# Patient Record
Sex: Female | Born: 1935 | Race: White | Hispanic: No | Smoking: Former smoker
Health system: Southern US, Community
[De-identification: ages and names within clinical notes are randomized; demographics above are authoritative.]

## PROBLEM LIST (undated history)

## (undated) DIAGNOSIS — I459 Conduction disorder, unspecified: Secondary | ICD-10-CM

## (undated) DIAGNOSIS — I639 Cerebral infarction, unspecified: Secondary | ICD-10-CM

## (undated) DIAGNOSIS — K284 Chronic or unspecified gastrojejunal ulcer with hemorrhage: Secondary | ICD-10-CM

## (undated) DIAGNOSIS — I509 Heart failure, unspecified: Secondary | ICD-10-CM

## (undated) DIAGNOSIS — IMO0001 Reserved for inherently not codable concepts without codable children: Secondary | ICD-10-CM

## (undated) DIAGNOSIS — Z8711 Personal history of peptic ulcer disease: Secondary | ICD-10-CM

## (undated) DIAGNOSIS — F419 Anxiety disorder, unspecified: Secondary | ICD-10-CM

## (undated) DIAGNOSIS — I2510 Atherosclerotic heart disease of native coronary artery without angina pectoris: Secondary | ICD-10-CM

## (undated) DIAGNOSIS — J42 Unspecified chronic bronchitis: Secondary | ICD-10-CM

## (undated) DIAGNOSIS — D494 Neoplasm of unspecified behavior of bladder: Secondary | ICD-10-CM

## (undated) DIAGNOSIS — J9610 Chronic respiratory failure, unspecified whether with hypoxia or hypercapnia: Secondary | ICD-10-CM

## (undated) DIAGNOSIS — I10 Essential (primary) hypertension: Secondary | ICD-10-CM

## (undated) DIAGNOSIS — Z87442 Personal history of urinary calculi: Secondary | ICD-10-CM

## (undated) DIAGNOSIS — M1990 Unspecified osteoarthritis, unspecified site: Secondary | ICD-10-CM

## (undated) DIAGNOSIS — I219 Acute myocardial infarction, unspecified: Secondary | ICD-10-CM

## (undated) DIAGNOSIS — K274 Chronic or unspecified peptic ulcer, site unspecified, with hemorrhage: Secondary | ICD-10-CM

## (undated) DIAGNOSIS — J189 Pneumonia, unspecified organism: Secondary | ICD-10-CM

## (undated) DIAGNOSIS — K219 Gastro-esophageal reflux disease without esophagitis: Secondary | ICD-10-CM

## (undated) DIAGNOSIS — J449 Chronic obstructive pulmonary disease, unspecified: Secondary | ICD-10-CM

## (undated) DIAGNOSIS — E785 Hyperlipidemia, unspecified: Secondary | ICD-10-CM

## (undated) DIAGNOSIS — N9489 Other specified conditions associated with female genital organs and menstrual cycle: Secondary | ICD-10-CM

## (undated) DIAGNOSIS — R569 Unspecified convulsions: Secondary | ICD-10-CM

## (undated) DIAGNOSIS — I82409 Acute embolism and thrombosis of unspecified deep veins of unspecified lower extremity: Secondary | ICD-10-CM

## (undated) HISTORY — PX: ABDOMINAL HYSTERECTOMY: SHX81

## (undated) HISTORY — PX: STOMACH SURGERY: SHX791

## (undated) HISTORY — PX: HIP FRACTURE SURGERY: SHX118

## (undated) HISTORY — PX: CARDIAC CATHETERIZATION: SHX172

---

## 2004-11-24 ENCOUNTER — Emergency Department: Payer: Self-pay | Admitting: Internal Medicine

## 2008-04-28 ENCOUNTER — Ambulatory Visit: Payer: Self-pay | Admitting: Internal Medicine

## 2008-04-30 ENCOUNTER — Ambulatory Visit: Payer: Self-pay | Admitting: Family Medicine

## 2008-05-04 ENCOUNTER — Ambulatory Visit: Payer: Self-pay | Admitting: Family Medicine

## 2008-05-07 ENCOUNTER — Ambulatory Visit: Payer: Self-pay | Admitting: Family Medicine

## 2008-05-11 ENCOUNTER — Ambulatory Visit: Payer: Self-pay | Admitting: Family Medicine

## 2008-05-15 ENCOUNTER — Ambulatory Visit: Payer: Self-pay | Admitting: Internal Medicine

## 2008-06-07 ENCOUNTER — Ambulatory Visit: Payer: Self-pay | Admitting: Internal Medicine

## 2008-08-16 ENCOUNTER — Ambulatory Visit: Payer: Self-pay | Admitting: Internal Medicine

## 2008-11-03 ENCOUNTER — Ambulatory Visit: Payer: Self-pay | Admitting: Internal Medicine

## 2008-12-14 ENCOUNTER — Ambulatory Visit: Payer: Self-pay | Admitting: Internal Medicine

## 2009-01-23 ENCOUNTER — Ambulatory Visit: Payer: Self-pay | Admitting: Family Medicine

## 2009-04-02 ENCOUNTER — Ambulatory Visit: Payer: Self-pay | Admitting: Family Medicine

## 2009-06-30 ENCOUNTER — Ambulatory Visit: Payer: Self-pay | Admitting: Family Medicine

## 2009-07-10 ENCOUNTER — Ambulatory Visit: Payer: Self-pay | Admitting: Family Medicine

## 2009-08-27 ENCOUNTER — Ambulatory Visit: Payer: Self-pay | Admitting: Internal Medicine

## 2009-09-29 ENCOUNTER — Ambulatory Visit: Payer: Self-pay | Admitting: Internal Medicine

## 2010-02-24 ENCOUNTER — Ambulatory Visit: Payer: Self-pay | Admitting: Internal Medicine

## 2010-04-06 ENCOUNTER — Observation Stay: Payer: Self-pay | Admitting: Internal Medicine

## 2010-04-06 ENCOUNTER — Ambulatory Visit: Payer: Self-pay | Admitting: Internal Medicine

## 2010-05-22 ENCOUNTER — Inpatient Hospital Stay: Payer: Self-pay | Admitting: Unknown Physician Specialty

## 2010-05-28 LAB — PATHOLOGY REPORT

## 2011-04-30 ENCOUNTER — Ambulatory Visit: Payer: Self-pay | Admitting: Internal Medicine

## 2011-08-02 ENCOUNTER — Ambulatory Visit: Payer: Self-pay

## 2011-08-02 DIAGNOSIS — L270 Generalized skin eruption due to drugs and medicaments taken internally: Secondary | ICD-10-CM | POA: Diagnosis not present

## 2011-08-06 ENCOUNTER — Ambulatory Visit: Payer: Self-pay | Admitting: Internal Medicine

## 2011-08-06 DIAGNOSIS — Z4800 Encounter for change or removal of nonsurgical wound dressing: Secondary | ICD-10-CM | POA: Diagnosis not present

## 2011-09-17 ENCOUNTER — Ambulatory Visit: Payer: Self-pay | Admitting: Internal Medicine

## 2011-09-17 DIAGNOSIS — R42 Dizziness and giddiness: Secondary | ICD-10-CM | POA: Diagnosis not present

## 2011-09-17 DIAGNOSIS — I2510 Atherosclerotic heart disease of native coronary artery without angina pectoris: Secondary | ICD-10-CM | POA: Diagnosis not present

## 2011-09-17 DIAGNOSIS — I10 Essential (primary) hypertension: Secondary | ICD-10-CM | POA: Diagnosis not present

## 2011-10-08 DIAGNOSIS — E785 Hyperlipidemia, unspecified: Secondary | ICD-10-CM | POA: Diagnosis not present

## 2011-10-08 DIAGNOSIS — I10 Essential (primary) hypertension: Secondary | ICD-10-CM | POA: Diagnosis not present

## 2011-10-08 DIAGNOSIS — Z79899 Other long term (current) drug therapy: Secondary | ICD-10-CM | POA: Diagnosis not present

## 2011-10-26 ENCOUNTER — Emergency Department: Payer: Self-pay | Admitting: Emergency Medicine

## 2011-10-26 ENCOUNTER — Ambulatory Visit: Payer: Self-pay | Admitting: Family Medicine

## 2011-10-26 DIAGNOSIS — R0602 Shortness of breath: Secondary | ICD-10-CM | POA: Diagnosis not present

## 2011-10-26 DIAGNOSIS — R6889 Other general symptoms and signs: Secondary | ICD-10-CM | POA: Diagnosis not present

## 2011-10-26 DIAGNOSIS — R062 Wheezing: Secondary | ICD-10-CM | POA: Diagnosis not present

## 2011-10-26 DIAGNOSIS — R079 Chest pain, unspecified: Secondary | ICD-10-CM | POA: Diagnosis not present

## 2011-10-26 DIAGNOSIS — Z7982 Long term (current) use of aspirin: Secondary | ICD-10-CM | POA: Diagnosis not present

## 2011-10-26 DIAGNOSIS — Z95818 Presence of other cardiac implants and grafts: Secondary | ICD-10-CM | POA: Diagnosis not present

## 2011-10-26 DIAGNOSIS — Z79899 Other long term (current) drug therapy: Secondary | ICD-10-CM | POA: Diagnosis not present

## 2011-10-26 DIAGNOSIS — R509 Fever, unspecified: Secondary | ICD-10-CM | POA: Diagnosis not present

## 2011-10-26 DIAGNOSIS — J441 Chronic obstructive pulmonary disease with (acute) exacerbation: Secondary | ICD-10-CM | POA: Diagnosis not present

## 2011-10-26 LAB — CBC
HCT: 40.8 % (ref 35.0–47.0)
MCV: 95 fL (ref 80–100)
Platelet: 219 10*3/uL (ref 150–440)
RBC: 4.3 10*6/uL (ref 3.80–5.20)
WBC: 11.1 10*3/uL — ABNORMAL HIGH (ref 3.6–11.0)

## 2011-10-26 LAB — COMPREHENSIVE METABOLIC PANEL
Albumin: 3.6 g/dL (ref 3.4–5.0)
Alkaline Phosphatase: 116 U/L (ref 50–136)
Anion Gap: 9 (ref 7–16)
Bilirubin,Total: 0.8 mg/dL (ref 0.2–1.0)
Calcium, Total: 9.4 mg/dL (ref 8.5–10.1)
Co2: 28 mmol/L (ref 21–32)
Creatinine: 0.68 mg/dL (ref 0.60–1.30)
Osmolality: 274 (ref 275–301)
Potassium: 3.4 mmol/L — ABNORMAL LOW (ref 3.5–5.1)
SGOT(AST): 27 U/L (ref 15–37)
SGPT (ALT): 24 U/L
Sodium: 138 mmol/L (ref 136–145)
Total Protein: 7.7 g/dL (ref 6.4–8.2)

## 2011-10-26 LAB — URINALYSIS, COMPLETE
Glucose,UR: NEGATIVE mg/dL (ref 0–75)
Nitrite: NEGATIVE
Ph: 6 (ref 4.5–8.0)
RBC,UR: 3 /HPF (ref 0–5)
Squamous Epithelial: <1
WBC UR: 5 /HPF (ref 0–5)

## 2011-10-26 LAB — TROPONIN I: Troponin-I: <0.02 ng/mL

## 2011-10-29 DIAGNOSIS — I10 Essential (primary) hypertension: Secondary | ICD-10-CM | POA: Diagnosis not present

## 2011-10-29 DIAGNOSIS — J189 Pneumonia, unspecified organism: Secondary | ICD-10-CM | POA: Diagnosis not present

## 2011-11-09 ENCOUNTER — Ambulatory Visit: Payer: Self-pay | Admitting: Family Medicine

## 2011-11-09 DIAGNOSIS — M25559 Pain in unspecified hip: Secondary | ICD-10-CM | POA: Diagnosis not present

## 2011-11-09 DIAGNOSIS — IMO0002 Reserved for concepts with insufficient information to code with codable children: Secondary | ICD-10-CM | POA: Diagnosis not present

## 2011-11-09 DIAGNOSIS — M24159 Other articular cartilage disorders, unspecified hip: Secondary | ICD-10-CM | POA: Diagnosis not present

## 2012-04-19 DIAGNOSIS — H26019 Infantile and juvenile cortical, lamellar, or zonular cataract, unspecified eye: Secondary | ICD-10-CM | POA: Diagnosis not present

## 2012-10-09 DIAGNOSIS — I2510 Atherosclerotic heart disease of native coronary artery without angina pectoris: Secondary | ICD-10-CM | POA: Diagnosis not present

## 2012-10-09 DIAGNOSIS — I10 Essential (primary) hypertension: Secondary | ICD-10-CM | POA: Diagnosis not present

## 2012-10-21 ENCOUNTER — Ambulatory Visit: Payer: Self-pay | Admitting: Family Medicine

## 2012-10-21 DIAGNOSIS — K219 Gastro-esophageal reflux disease without esophagitis: Secondary | ICD-10-CM | POA: Diagnosis not present

## 2012-10-21 DIAGNOSIS — Z79899 Other long term (current) drug therapy: Secondary | ICD-10-CM | POA: Diagnosis not present

## 2012-10-21 DIAGNOSIS — I10 Essential (primary) hypertension: Secondary | ICD-10-CM | POA: Diagnosis not present

## 2012-10-21 DIAGNOSIS — R05 Cough: Secondary | ICD-10-CM | POA: Diagnosis not present

## 2012-10-21 DIAGNOSIS — E7800 Pure hypercholesterolemia, unspecified: Secondary | ICD-10-CM | POA: Diagnosis not present

## 2012-10-21 DIAGNOSIS — R1012 Left upper quadrant pain: Secondary | ICD-10-CM | POA: Diagnosis not present

## 2012-10-21 DIAGNOSIS — R079 Chest pain, unspecified: Secondary | ICD-10-CM | POA: Diagnosis not present

## 2012-10-21 DIAGNOSIS — M549 Dorsalgia, unspecified: Secondary | ICD-10-CM | POA: Diagnosis not present

## 2012-10-21 LAB — URINALYSIS, COMPLETE
Bacteria: NEGATIVE
Bilirubin,UR: NEGATIVE
Glucose,UR: NEGATIVE mg/dL (ref 0–75)
Ketone: NEGATIVE
Nitrite: NEGATIVE
Ph: 8 (ref 4.5–8.0)
Specific Gravity: 1.01 (ref 1.003–1.030)

## 2012-10-22 ENCOUNTER — Ambulatory Visit: Payer: Self-pay | Admitting: Physician Assistant

## 2012-10-22 DIAGNOSIS — I10 Essential (primary) hypertension: Secondary | ICD-10-CM | POA: Diagnosis not present

## 2012-10-22 DIAGNOSIS — N200 Calculus of kidney: Secondary | ICD-10-CM | POA: Diagnosis not present

## 2012-10-22 DIAGNOSIS — R1909 Other intra-abdominal and pelvic swelling, mass and lump: Secondary | ICD-10-CM | POA: Diagnosis not present

## 2012-10-22 DIAGNOSIS — J438 Other emphysema: Secondary | ICD-10-CM | POA: Diagnosis not present

## 2012-10-22 DIAGNOSIS — E7800 Pure hypercholesterolemia, unspecified: Secondary | ICD-10-CM | POA: Diagnosis not present

## 2012-10-22 DIAGNOSIS — Z90710 Acquired absence of both cervix and uterus: Secondary | ICD-10-CM | POA: Diagnosis not present

## 2012-10-22 LAB — CBC WITH DIFFERENTIAL/PLATELET
Basophil %: 0.2 %
HGB: 13.8 g/dL (ref 12.0–16.0)
Lymphocyte #: 1.2 10*3/uL (ref 1.0–3.6)
MCH: 29.3 pg (ref 26.0–34.0)
MCHC: 33 g/dL (ref 32.0–36.0)
Monocyte %: 6.3 %
Neutrophil %: 76.1 %
Platelet: 276 10*3/uL (ref 150–440)
RBC: 4.73 10*6/uL (ref 3.80–5.20)
RDW: 13 % (ref 11.5–14.5)
WBC: 7.1 10*3/uL (ref 3.6–11.0)

## 2012-10-22 LAB — BASIC METABOLIC PANEL
Anion Gap: 8 (ref 7–16)
BUN: 11 mg/dL (ref 7–18)
Calcium, Total: 9.3 mg/dL (ref 8.5–10.1)
Creatinine: 0.58 mg/dL — ABNORMAL LOW (ref 0.60–1.30)
Osmolality: 250 (ref 275–301)
Sodium: 124 mmol/L — ABNORMAL LOW (ref 136–145)

## 2012-10-23 ENCOUNTER — Ambulatory Visit: Payer: Self-pay | Admitting: Family Medicine

## 2012-10-23 DIAGNOSIS — R109 Unspecified abdominal pain: Secondary | ICD-10-CM | POA: Diagnosis not present

## 2012-10-23 DIAGNOSIS — R1031 Right lower quadrant pain: Secondary | ICD-10-CM | POA: Diagnosis not present

## 2012-10-23 DIAGNOSIS — N83209 Unspecified ovarian cyst, unspecified side: Secondary | ICD-10-CM | POA: Diagnosis not present

## 2012-10-23 LAB — CBC WITH DIFFERENTIAL/PLATELET
Basophil #: 0 10*3/uL (ref 0.0–0.1)
Basophil %: 0.1 %
Eosinophil #: 0 10*3/uL (ref 0.0–0.7)
Eosinophil %: 0.3 %
HGB: 14 g/dL (ref 12.0–16.0)
Lymphocyte #: 1.3 10*3/uL (ref 1.0–3.6)
MCH: 28.9 pg (ref 26.0–34.0)
MCHC: 33 g/dL (ref 32.0–36.0)
MCV: 88 fL (ref 80–100)
Monocyte #: 0.7 x10 3/mm (ref 0.2–0.9)
Monocyte %: 7.3 %
RBC: 4.84 10*6/uL (ref 3.80–5.20)
RDW: 13 % (ref 11.5–14.5)
WBC: 9 10*3/uL (ref 3.6–11.0)

## 2012-10-26 DIAGNOSIS — N83209 Unspecified ovarian cyst, unspecified side: Secondary | ICD-10-CM | POA: Diagnosis not present

## 2012-10-27 DIAGNOSIS — B029 Zoster without complications: Secondary | ICD-10-CM | POA: Diagnosis not present

## 2012-10-27 DIAGNOSIS — Z1151 Encounter for screening for human papillomavirus (HPV): Secondary | ICD-10-CM | POA: Diagnosis not present

## 2012-10-27 DIAGNOSIS — Z01419 Encounter for gynecological examination (general) (routine) without abnormal findings: Secondary | ICD-10-CM | POA: Diagnosis not present

## 2012-10-30 DIAGNOSIS — N949 Unspecified condition associated with female genital organs and menstrual cycle: Secondary | ICD-10-CM | POA: Diagnosis not present

## 2012-10-30 DIAGNOSIS — N83209 Unspecified ovarian cyst, unspecified side: Secondary | ICD-10-CM | POA: Diagnosis not present

## 2012-10-30 DIAGNOSIS — R1900 Intra-abdominal and pelvic swelling, mass and lump, unspecified site: Secondary | ICD-10-CM | POA: Diagnosis not present

## 2012-11-06 DIAGNOSIS — N83209 Unspecified ovarian cyst, unspecified side: Secondary | ICD-10-CM | POA: Diagnosis not present

## 2012-11-08 ENCOUNTER — Ambulatory Visit: Payer: Self-pay | Admitting: Gynecologic Oncology

## 2012-11-14 ENCOUNTER — Ambulatory Visit: Payer: Self-pay | Admitting: Gynecologic Oncology

## 2012-11-14 DIAGNOSIS — Z90710 Acquired absence of both cervix and uterus: Secondary | ICD-10-CM | POA: Diagnosis not present

## 2012-11-14 DIAGNOSIS — Z87891 Personal history of nicotine dependence: Secondary | ICD-10-CM | POA: Diagnosis not present

## 2012-11-14 DIAGNOSIS — R059 Cough, unspecified: Secondary | ICD-10-CM | POA: Diagnosis not present

## 2012-11-14 DIAGNOSIS — I10 Essential (primary) hypertension: Secondary | ICD-10-CM | POA: Diagnosis not present

## 2012-11-14 DIAGNOSIS — I2510 Atherosclerotic heart disease of native coronary artery without angina pectoris: Secondary | ICD-10-CM | POA: Diagnosis not present

## 2012-11-14 DIAGNOSIS — Z79899 Other long term (current) drug therapy: Secondary | ICD-10-CM | POA: Diagnosis not present

## 2012-11-14 DIAGNOSIS — R109 Unspecified abdominal pain: Secondary | ICD-10-CM | POA: Diagnosis not present

## 2012-11-14 DIAGNOSIS — Z95818 Presence of other cardiac implants and grafts: Secondary | ICD-10-CM | POA: Diagnosis not present

## 2012-11-14 DIAGNOSIS — Z7902 Long term (current) use of antithrombotics/antiplatelets: Secondary | ICD-10-CM | POA: Diagnosis not present

## 2012-11-14 DIAGNOSIS — N83209 Unspecified ovarian cyst, unspecified side: Secondary | ICD-10-CM | POA: Diagnosis not present

## 2012-11-17 DIAGNOSIS — R0989 Other specified symptoms and signs involving the circulatory and respiratory systems: Secondary | ICD-10-CM | POA: Diagnosis not present

## 2012-11-17 DIAGNOSIS — I10 Essential (primary) hypertension: Secondary | ICD-10-CM | POA: Diagnosis not present

## 2012-11-17 DIAGNOSIS — R0789 Other chest pain: Secondary | ICD-10-CM | POA: Diagnosis not present

## 2012-11-17 DIAGNOSIS — R0609 Other forms of dyspnea: Secondary | ICD-10-CM | POA: Diagnosis not present

## 2012-11-17 DIAGNOSIS — I2510 Atherosclerotic heart disease of native coronary artery without angina pectoris: Secondary | ICD-10-CM | POA: Diagnosis not present

## 2012-11-27 DIAGNOSIS — I2510 Atherosclerotic heart disease of native coronary artery without angina pectoris: Secondary | ICD-10-CM | POA: Diagnosis not present

## 2012-11-27 DIAGNOSIS — E782 Mixed hyperlipidemia: Secondary | ICD-10-CM | POA: Diagnosis not present

## 2012-11-27 DIAGNOSIS — I10 Essential (primary) hypertension: Secondary | ICD-10-CM | POA: Diagnosis not present

## 2012-12-08 ENCOUNTER — Ambulatory Visit: Payer: Self-pay | Admitting: Gynecologic Oncology

## 2012-12-19 ENCOUNTER — Ambulatory Visit: Payer: Self-pay

## 2012-12-19 DIAGNOSIS — B0229 Other postherpetic nervous system involvement: Secondary | ICD-10-CM | POA: Diagnosis not present

## 2013-01-12 ENCOUNTER — Emergency Department: Payer: Self-pay | Admitting: Emergency Medicine

## 2013-01-12 DIAGNOSIS — IMO0002 Reserved for concepts with insufficient information to code with codable children: Secondary | ICD-10-CM | POA: Diagnosis not present

## 2013-01-12 DIAGNOSIS — N200 Calculus of kidney: Secondary | ICD-10-CM | POA: Diagnosis not present

## 2013-01-12 DIAGNOSIS — M5137 Other intervertebral disc degeneration, lumbosacral region: Secondary | ICD-10-CM | POA: Diagnosis not present

## 2013-01-12 DIAGNOSIS — G9389 Other specified disorders of brain: Secondary | ICD-10-CM | POA: Diagnosis not present

## 2013-01-12 DIAGNOSIS — R42 Dizziness and giddiness: Secondary | ICD-10-CM | POA: Diagnosis not present

## 2013-01-12 DIAGNOSIS — K8020 Calculus of gallbladder without cholecystitis without obstruction: Secondary | ICD-10-CM | POA: Diagnosis not present

## 2013-01-12 DIAGNOSIS — J984 Other disorders of lung: Secondary | ICD-10-CM | POA: Diagnosis not present

## 2013-01-12 DIAGNOSIS — S3981XA Other specified injuries of abdomen, initial encounter: Secondary | ICD-10-CM | POA: Diagnosis not present

## 2013-01-12 DIAGNOSIS — S0993XA Unspecified injury of face, initial encounter: Secondary | ICD-10-CM | POA: Diagnosis not present

## 2013-01-12 DIAGNOSIS — S0990XA Unspecified injury of head, initial encounter: Secondary | ICD-10-CM | POA: Diagnosis not present

## 2013-01-12 DIAGNOSIS — S298XXA Other specified injuries of thorax, initial encounter: Secondary | ICD-10-CM | POA: Diagnosis not present

## 2013-01-12 DIAGNOSIS — R51 Headache: Secondary | ICD-10-CM | POA: Diagnosis not present

## 2013-01-12 DIAGNOSIS — S0003XA Contusion of scalp, initial encounter: Secondary | ICD-10-CM | POA: Diagnosis not present

## 2013-01-12 DIAGNOSIS — M542 Cervicalgia: Secondary | ICD-10-CM | POA: Diagnosis not present

## 2013-01-13 DIAGNOSIS — S3981XA Other specified injuries of abdomen, initial encounter: Secondary | ICD-10-CM | POA: Diagnosis not present

## 2013-01-13 DIAGNOSIS — S0990XA Unspecified injury of head, initial encounter: Secondary | ICD-10-CM | POA: Diagnosis not present

## 2013-01-13 DIAGNOSIS — S298XXA Other specified injuries of thorax, initial encounter: Secondary | ICD-10-CM | POA: Diagnosis not present

## 2013-01-13 DIAGNOSIS — S0993XA Unspecified injury of face, initial encounter: Secondary | ICD-10-CM | POA: Diagnosis not present

## 2013-01-13 DIAGNOSIS — N200 Calculus of kidney: Secondary | ICD-10-CM | POA: Diagnosis not present

## 2013-01-13 DIAGNOSIS — IMO0002 Reserved for concepts with insufficient information to code with codable children: Secondary | ICD-10-CM | POA: Diagnosis not present

## 2013-01-13 DIAGNOSIS — G9389 Other specified disorders of brain: Secondary | ICD-10-CM | POA: Diagnosis not present

## 2013-05-15 DIAGNOSIS — I2510 Atherosclerotic heart disease of native coronary artery without angina pectoris: Secondary | ICD-10-CM | POA: Diagnosis not present

## 2013-05-15 DIAGNOSIS — E782 Mixed hyperlipidemia: Secondary | ICD-10-CM | POA: Diagnosis not present

## 2013-05-15 DIAGNOSIS — I10 Essential (primary) hypertension: Secondary | ICD-10-CM | POA: Diagnosis not present

## 2013-09-09 ENCOUNTER — Ambulatory Visit: Payer: Self-pay | Admitting: Family Medicine

## 2013-09-09 DIAGNOSIS — J40 Bronchitis, not specified as acute or chronic: Secondary | ICD-10-CM | POA: Diagnosis not present

## 2013-09-09 DIAGNOSIS — J984 Other disorders of lung: Secondary | ICD-10-CM | POA: Diagnosis not present

## 2013-09-09 DIAGNOSIS — Z79899 Other long term (current) drug therapy: Secondary | ICD-10-CM | POA: Diagnosis not present

## 2013-09-09 DIAGNOSIS — E7800 Pure hypercholesterolemia, unspecified: Secondary | ICD-10-CM | POA: Diagnosis not present

## 2013-09-09 DIAGNOSIS — Z7902 Long term (current) use of antithrombotics/antiplatelets: Secondary | ICD-10-CM | POA: Diagnosis not present

## 2013-09-09 DIAGNOSIS — J438 Other emphysema: Secondary | ICD-10-CM | POA: Diagnosis not present

## 2013-09-09 DIAGNOSIS — J329 Chronic sinusitis, unspecified: Secondary | ICD-10-CM | POA: Diagnosis not present

## 2013-09-09 DIAGNOSIS — I10 Essential (primary) hypertension: Secondary | ICD-10-CM | POA: Diagnosis not present

## 2013-09-09 LAB — CBC WITH DIFFERENTIAL/PLATELET
BASOS ABS: 0 10*3/uL (ref 0.0–0.1)
Basophil %: 0.2 %
Eosinophil #: 0.2 10*3/uL (ref 0.0–0.7)
Eosinophil %: 3.3 %
HCT: 36.9 % (ref 35.0–47.0)
HGB: 11.9 g/dL — ABNORMAL LOW (ref 12.0–16.0)
Lymphocyte #: 1.3 10*3/uL (ref 1.0–3.6)
Lymphocyte %: 21 %
MCH: 29.3 pg (ref 26.0–34.0)
MCHC: 32.3 g/dL (ref 32.0–36.0)
MCV: 91 fL (ref 80–100)
MONOS PCT: 5.7 %
Monocyte #: 0.4 x10 3/mm (ref 0.2–0.9)
NEUTROS ABS: 4.3 10*3/uL (ref 1.4–6.5)
Neutrophil %: 69.8 %
Platelet: 301 10*3/uL (ref 150–440)
RBC: 4.06 10*6/uL (ref 3.80–5.20)
RDW: 12.5 % (ref 11.5–14.5)
WBC: 6.2 10*3/uL (ref 3.6–11.0)

## 2013-09-09 LAB — COMPREHENSIVE METABOLIC PANEL
Albumin: 2.7 g/dL — ABNORMAL LOW (ref 3.4–5.0)
Alkaline Phosphatase: 123 U/L — ABNORMAL HIGH
Anion Gap: 8 (ref 7–16)
BUN: 13 mg/dL (ref 7–18)
Bilirubin,Total: 0.2 mg/dL (ref 0.2–1.0)
CALCIUM: 9.3 mg/dL (ref 8.5–10.1)
CHLORIDE: 100 mmol/L (ref 98–107)
Co2: 31 mmol/L (ref 21–32)
Creatinine: 0.67 mg/dL (ref 0.60–1.30)
GLUCOSE: 113 mg/dL — AB (ref 65–99)
OSMOLALITY: 278 (ref 275–301)
POTASSIUM: 4.3 mmol/L (ref 3.5–5.1)
SGOT(AST): 11 U/L — ABNORMAL LOW (ref 15–37)
SGPT (ALT): 16 U/L (ref 12–78)
Sodium: 139 mmol/L (ref 136–145)
Total Protein: 6.7 g/dL (ref 6.4–8.2)

## 2013-10-26 DIAGNOSIS — R609 Edema, unspecified: Secondary | ICD-10-CM | POA: Diagnosis not present

## 2013-11-28 DIAGNOSIS — N9489 Other specified conditions associated with female genital organs and menstrual cycle: Secondary | ICD-10-CM | POA: Insufficient documentation

## 2013-11-28 DIAGNOSIS — I10 Essential (primary) hypertension: Secondary | ICD-10-CM | POA: Insufficient documentation

## 2013-11-28 DIAGNOSIS — E785 Hyperlipidemia, unspecified: Secondary | ICD-10-CM | POA: Diagnosis not present

## 2013-11-28 DIAGNOSIS — Z9889 Other specified postprocedural states: Secondary | ICD-10-CM

## 2014-01-23 ENCOUNTER — Ambulatory Visit: Payer: Self-pay

## 2014-01-23 DIAGNOSIS — I10 Essential (primary) hypertension: Secondary | ICD-10-CM | POA: Diagnosis not present

## 2014-01-23 DIAGNOSIS — Z7902 Long term (current) use of antithrombotics/antiplatelets: Secondary | ICD-10-CM | POA: Diagnosis not present

## 2014-01-23 DIAGNOSIS — K5289 Other specified noninfective gastroenteritis and colitis: Secondary | ICD-10-CM | POA: Diagnosis not present

## 2014-01-23 DIAGNOSIS — R109 Unspecified abdominal pain: Secondary | ICD-10-CM | POA: Diagnosis not present

## 2014-01-23 DIAGNOSIS — E7800 Pure hypercholesterolemia, unspecified: Secondary | ICD-10-CM | POA: Diagnosis not present

## 2014-01-23 DIAGNOSIS — Z79899 Other long term (current) drug therapy: Secondary | ICD-10-CM | POA: Diagnosis not present

## 2014-01-23 DIAGNOSIS — J438 Other emphysema: Secondary | ICD-10-CM | POA: Diagnosis not present

## 2014-01-23 DIAGNOSIS — I509 Heart failure, unspecified: Secondary | ICD-10-CM | POA: Diagnosis not present

## 2014-01-23 LAB — COMPREHENSIVE METABOLIC PANEL
ALT: 16 U/L
ANION GAP: 5 — AB (ref 7–16)
Albumin: 3.4 g/dL (ref 3.4–5.0)
Alkaline Phosphatase: 123 U/L — ABNORMAL HIGH
BUN: 11 mg/dL (ref 7–18)
Bilirubin,Total: 0.4 mg/dL (ref 0.2–1.0)
CHLORIDE: 94 mmol/L — AB (ref 98–107)
CO2: 32 mmol/L (ref 21–32)
Calcium, Total: 9.4 mg/dL (ref 8.5–10.1)
Creatinine: 0.72 mg/dL (ref 0.60–1.30)
EGFR (Non-African Amer.): >60
Glucose: 91 mg/dL (ref 65–99)
Osmolality: 262 (ref 275–301)
Potassium: 3.9 mmol/L (ref 3.5–5.1)
SGOT(AST): 11 U/L — ABNORMAL LOW (ref 15–37)
Sodium: 131 mmol/L — ABNORMAL LOW (ref 136–145)
Total Protein: 7 g/dL (ref 6.4–8.2)

## 2014-01-23 LAB — URINALYSIS, COMPLETE
Bilirubin,UR: NEGATIVE
GLUCOSE, UR: NEGATIVE
Ketone: NEGATIVE
Nitrite: NEGATIVE
Ph: 6 (ref 5.0–8.0)
Specific Gravity: 1.02 (ref 1.000–1.030)

## 2014-01-23 LAB — CBC WITH DIFFERENTIAL/PLATELET
BASOS PCT: 0.1 %
Basophil #: 0 10*3/uL (ref 0.0–0.1)
Eosinophil #: 0.1 10*3/uL (ref 0.0–0.7)
Eosinophil %: 2.2 %
HCT: 41.3 % (ref 35.0–47.0)
HGB: 13.1 g/dL (ref 12.0–16.0)
LYMPHS ABS: 1.1 10*3/uL (ref 1.0–3.6)
Lymphocyte %: 19.6 %
MCH: 28.9 pg (ref 26.0–34.0)
MCHC: 31.7 g/dL — AB (ref 32.0–36.0)
MCV: 91 fL (ref 80–100)
MONOS PCT: 9.1 %
Monocyte #: 0.5 x10 3/mm (ref 0.2–0.9)
Neutrophil #: 4 10*3/uL (ref 1.4–6.5)
Neutrophil %: 69 %
Platelet: 244 10*3/uL (ref 150–440)
RBC: 4.54 10*6/uL (ref 3.80–5.20)
RDW: 13.3 % (ref 11.5–14.5)
WBC: 5.8 10*3/uL (ref 3.6–11.0)

## 2014-01-23 LAB — PRO B NATRIURETIC PEPTIDE: B-Type Natriuretic Peptide: 624 pg/mL — ABNORMAL HIGH (ref 0–450)

## 2014-01-23 LAB — AMYLASE: AMYLASE: 36 U/L (ref 25–115)

## 2014-01-23 LAB — LIPASE, BLOOD: LIPASE: 94 U/L (ref 73–393)

## 2014-02-13 DIAGNOSIS — I10 Essential (primary) hypertension: Secondary | ICD-10-CM | POA: Diagnosis not present

## 2014-02-13 DIAGNOSIS — R0602 Shortness of breath: Secondary | ICD-10-CM | POA: Diagnosis not present

## 2014-02-13 DIAGNOSIS — E780 Pure hypercholesterolemia: Secondary | ICD-10-CM | POA: Diagnosis not present

## 2014-02-13 DIAGNOSIS — Z9889 Other specified postprocedural states: Secondary | ICD-10-CM | POA: Diagnosis not present

## 2014-02-14 DIAGNOSIS — R0602 Shortness of breath: Secondary | ICD-10-CM | POA: Diagnosis not present

## 2014-02-22 DIAGNOSIS — Z9889 Other specified postprocedural states: Secondary | ICD-10-CM | POA: Diagnosis not present

## 2014-02-22 DIAGNOSIS — E780 Pure hypercholesterolemia: Secondary | ICD-10-CM | POA: Diagnosis not present

## 2014-02-22 DIAGNOSIS — I10 Essential (primary) hypertension: Secondary | ICD-10-CM | POA: Diagnosis not present

## 2014-06-21 DIAGNOSIS — I10 Essential (primary) hypertension: Secondary | ICD-10-CM | POA: Diagnosis not present

## 2014-06-21 DIAGNOSIS — Z79899 Other long term (current) drug therapy: Secondary | ICD-10-CM | POA: Diagnosis not present

## 2014-06-21 DIAGNOSIS — I25729 Atherosclerosis of autologous artery coronary artery bypass graft(s) with unspecified angina pectoris: Secondary | ICD-10-CM | POA: Diagnosis not present

## 2014-06-21 DIAGNOSIS — Z1389 Encounter for screening for other disorder: Secondary | ICD-10-CM | POA: Diagnosis not present

## 2014-12-01 ENCOUNTER — Ambulatory Visit
Admission: EM | Admit: 2014-12-01 | Discharge: 2014-12-01 | Disposition: A | Discharge status: Home / Self Care | Payer: Medicare Other | Attending: Internal Medicine | Admitting: Internal Medicine

## 2014-12-01 ENCOUNTER — Other Ambulatory Visit: Payer: Self-pay

## 2014-12-01 ENCOUNTER — Encounter: Payer: Self-pay | Admitting: Gynecology

## 2014-12-01 DIAGNOSIS — M7989 Other specified soft tissue disorders: Secondary | ICD-10-CM | POA: Diagnosis not present

## 2014-12-01 DIAGNOSIS — E785 Hyperlipidemia, unspecified: Secondary | ICD-10-CM | POA: Insufficient documentation

## 2014-12-01 DIAGNOSIS — I10 Essential (primary) hypertension: Secondary | ICD-10-CM | POA: Diagnosis not present

## 2014-12-01 DIAGNOSIS — Z87891 Personal history of nicotine dependence: Secondary | ICD-10-CM | POA: Insufficient documentation

## 2014-12-01 DIAGNOSIS — R600 Localized edema: Secondary | ICD-10-CM

## 2014-12-01 DIAGNOSIS — Z79899 Other long term (current) drug therapy: Secondary | ICD-10-CM | POA: Diagnosis not present

## 2014-12-01 DIAGNOSIS — I459 Conduction disorder, unspecified: Secondary | ICD-10-CM | POA: Diagnosis not present

## 2014-12-01 HISTORY — DX: Hyperlipidemia, unspecified: E78.5

## 2014-12-01 HISTORY — DX: Chronic or unspecified peptic ulcer, site unspecified, with hemorrhage: K27.4

## 2014-12-01 HISTORY — DX: Chronic or unspecified gastrojejunal ulcer with hemorrhage: K28.4

## 2014-12-01 HISTORY — DX: Conduction disorder, unspecified: I45.9

## 2014-12-01 HISTORY — DX: Other specified conditions associated with female genital organs and menstrual cycle: N94.89

## 2014-12-01 HISTORY — DX: Essential (primary) hypertension: I10

## 2014-12-01 LAB — BASIC METABOLIC PANEL
Anion gap: 8 (ref 5–15)
BUN: 25 mg/dL — AB (ref 6–20)
CO2: 30 mmol/L (ref 22–32)
CREATININE: 0.64 mg/dL (ref 0.44–1.00)
Calcium: 9.7 mg/dL (ref 8.9–10.3)
Chloride: 101 mmol/L (ref 101–111)
GFR calc non Af Amer: >60 mL/min (ref >60)
Glucose, Bld: 97 mg/dL (ref 65–99)
Potassium: 4.4 mmol/L (ref 3.5–5.1)
Sodium: 139 mmol/L (ref 135–145)

## 2014-12-01 LAB — CBC WITH DIFFERENTIAL/PLATELET
BASOS ABS: 0 10*3/uL (ref 0–0.1)
Basophils Relative: 0 %
EOS PCT: 1 %
Eosinophils Absolute: 0.1 10*3/uL (ref 0–0.7)
HCT: 41.1 % (ref 35.0–47.0)
HEMOGLOBIN: 13.3 g/dL (ref 12.0–16.0)
Lymphocytes Relative: 27 %
Lymphs Abs: 1.6 10*3/uL (ref 1.0–3.6)
MCH: 29.9 pg (ref 26.0–34.0)
MCHC: 32.4 g/dL (ref 32.0–36.0)
MCV: 92.3 fL (ref 80.0–100.0)
MONOS PCT: 7 %
Monocytes Absolute: 0.4 10*3/uL (ref 0.2–0.9)
Neutro Abs: 3.7 10*3/uL (ref 1.4–6.5)
Neutrophils Relative %: 65 %
Platelets: 234 10*3/uL (ref 150–440)
RBC: 4.46 MIL/uL (ref 3.80–5.20)
RDW: 14 % (ref 11.5–14.5)
WBC: 5.7 10*3/uL (ref 3.6–11.0)

## 2014-12-01 LAB — MAGNESIUM: MAGNESIUM: 2 mg/dL (ref 1.7–2.4)

## 2014-12-01 MED ORDER — POTASSIUM CHLORIDE ER 10 MEQ PO TBCR: 10.0000 meq | EXTENDED_RELEASE_TABLET | ORAL | Status: DC

## 2014-12-01 MED ORDER — FUROSEMIDE 20 MG PO TABS: 20.0000 mg | ORAL_TABLET | ORAL | Status: DC

## 2014-12-01 NOTE — ED Provider Notes (Signed)
CSN: [id number]     Arrival date & time 12/01/14  1144 History   First MD Initiated Contact with Patient 12/01/14 1237     Chief complaint: Swelling in feet HPI Patient is a 79 year old lady with past medical history notable for coronary disease, hypertension. She was started on hydrochlorothiazide in October 2015 for leg edema. At that time, echocardiogram showed preserved LV function, and some LVH. She presents today with a 5-6 day history of foot and lower extremity edema, symmetric. It is actually been slowly improving since onset on about July 19. She called out of work, and needs a note.  She has felt fine otherwise, no chest pain, no shortness of breath, no GI complaints, no anorexia. When she is up on her feet, walking around, her swollen legs are sore. She says that she is following her heart doctors diet for her, although she does also mention tomatoes. She is taking her medications, has not run out of anything. Blood pressure is elevated today, systolic of 660.  Past Medical History  Diagnosis Date  . Hypertension   . Hyperlipemia   . Heart block     left  . Adnexal mass   . Bleeding ulcer    Past Surgical History  Procedure Laterality Date  . Cardiac surgery      cardiac cath  . Abdominal hysterectomy    . Stomach surgery    . Hip surgery      left   No family history on file. History  Substance Use Topics  . Smoking status: Former Smoker    Quit date: 06/13/1998  . Smokeless tobacco: Not on file  . Alcohol Use: No    Review of Systems  All other systems reviewed and are negative.   Allergies  Review of patient's allergies indicates no known allergies.  Home Medications   Prior to Admission medications   Medication Sig Start Date End Date Taking? Authorizing Provider  acetaminophen (TYLENOL) 325 MG tablet Take 650 mg by mouth every 6 (six) hours as needed.   Yes Historical Provider, MD  amLODipine (NORVASC) 2.5 MG tablet Take 2.5 mg by mouth daily.   Yes  Historical Provider, MD  aspirin 325 MG tablet Take 325 mg by mouth daily.   Yes Historical Provider, MD  clopidogrel (PLAVIX) 75 MG tablet Take 75 mg by mouth daily.   Yes Historical Provider, MD  hydrochlorothiazide (MICROZIDE) 12.5 MG capsule Take 12.5 mg by mouth daily.   Yes Historical Provider, MD  metoprolol succinate (TOPROL-XL) 100 MG 24 hr tablet Take 100 mg by mouth daily. Take with or immediately following a meal.   Yes Historical Provider, MD   BP 178/72 mmHg  Pulse 60  Temp(Src) 98 F (36.7 C) (Oral)  Resp 12  Ht 5\' 4"  (1.626 m)  Wt 156 lb (70.761 kg)  BMI 26.76 kg/m2  SpO2 99% Physical Exam  Constitutional: She is oriented to person, place, and time. No distress.  Alert, nicely groomed  HENT:  Head: Atraumatic.  Eyes:  Conjugate gaze, no eye redness/drainage  Neck: Neck supple.  Cardiovascular: Normal rate and regular rhythm.   Pulmonary/Chest: No respiratory distress. She has no wheezes. She has no rales.  Lungs clear, symmetric breath sounds  Abdominal: She exhibits no distension.  Musculoskeletal: Normal range of motion.  1-2+ symmetric pitting lower extremity edema extends to about halfway up both lower legs;  Neurological: She is alert and oriented to person, place, and time.  Skin: Skin is warm and  dry.  No cyanosis  Nursing note and vitals reviewed.  Orthostatic VS for the past 24 hrs:  BP- Lying Pulse- Lying BP- Sitting Pulse- Sitting BP- Standing at 0 minutes Pulse- Standing at 0 minutes  12/01/14 1307 190/75 mmHg 66 (!) 184/95 mmHg 56 198/85 mmHg 63    ED Course  Procedures   Results for orders placed or performed during the hospital encounter of 46/28/63  Basic metabolic panel  Result Value Ref Range   Sodium 139 135 - 145 mmol/L   Potassium 4.4 3.5 - 5.1 mmol/L   Chloride 101 101 - 111 mmol/L   CO2 30 22 - 32 mmol/L   Glucose, Bld 97 65 - 99 mg/dL   BUN 25 (H) 6 - 20 mg/dL   Creatinine, Ser 0.64 0.44 - 1.00 mg/dL   Calcium 9.7 8.9 - 10.3  mg/dL   GFR calc non Af Amer >60 >60 mL/min   GFR calc Af Amer >60 >60 mL/min   Anion gap 8 5 - 15  Magnesium  Result Value Ref Range   Magnesium 2.0 1.7 - 2.4 mg/dL  CBC with Differential  Result Value Ref Range   WBC 5.7 3.6 - 11.0 K/uL   RBC 4.46 3.80 - 5.20 MIL/uL   Hemoglobin 13.3 12.0 - 16.0 g/dL   HCT 41.1 35.0 - 47.0 %   MCV 92.3 80.0 - 100.0 fL   MCH 29.9 26.0 - 34.0 pg   MCHC 32.4 32.0 - 36.0 g/dL   RDW 14.0 11.5 - 14.5 %   Platelets 234 150 - 440 K/uL   Neutrophils Relative % 65 %   Neutro Abs 3.7 1.4 - 6.5 K/uL   Lymphocytes Relative 27 %   Lymphs Abs 1.6 1.0 - 3.6 K/uL   Monocytes Relative 7 %   Monocytes Absolute 0.4 0.2 - 0.9 K/uL   Eosinophils Relative 1 %   Eosinophils Absolute 0.1 0 - 0.7 K/uL   Basophils Relative 0 %   Basophils Absolute 0.0 0 - 0.1 K/uL      ECG today 12:01:01 at the urgent care shows sinus brady, HR 53 bpm, no acute ST or T wave changes; QRS 78 ms, QTc 424 ms, QRS axis 16.  No significant change since ECG of 10/21/12 8:28:28.    MDM   1. Bilateral lower extremity edema    Possible contributions from excess salt in diet, uncontrolled bp.  Will add prn lasix (20mg  twice weekly, with 35mEq K+ per dose of lasix) and have pt FU with pcp/Dr Ronnald Ramp or Dr Saralyn Pilar to discuss increase in leg swelling.  Discussed importance of limiting salt in diet, taking bp meds as prescribed.   Note given to return to work tomorrow.   Sherlene Shams, MD 12/01/14 1336

## 2014-12-01 NOTE — Discharge Instructions (Signed)
Leg swelling can be related to uncontrolled blood pressure, and extra salt in the diet. Prescriptions for furosemide (lasix, a fluid pill) and potassium have been sent to [facility] in [place]. Followup with Dr Saralyn Pilar in the next week or so, to discuss increased leg swelling. 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  Peripheral Edema You have swelling in your legs (peripheral edema). This swelling is due to excess accumulation of salt and water in your body. Edema may be a sign of heart, kidney or liver disease, or a side effect of a medication. It may also be due to problems in the leg veins. Elevating your legs and using special support stockings may be very helpful, if the cause of the swelling is due to poor venous circulation. Avoid long periods of standing, whatever the cause. Treatment of edema depends on identifying the cause. Chips, pretzels, pickles and other salty foods should be avoided. Restricting salt in your diet is almost always needed. Water pills (diuretics) are often used to remove the excess salt and water from your body via urine. These medicines prevent the kidney from reabsorbing sodium. This increases urine flow. Diuretic treatment may also result in lowering of potassium levels in your body. Potassium supplements may be needed if you have to use diuretics daily. Daily weights can help you keep track of your progress in clearing your edema. You should call your caregiver for follow up care as recommended. SEEK IMMEDIATE MEDICAL CARE IF:   You have increased swelling, pain, redness, or heat in your legs.  You develop shortness of breath, especially when lying down.  You develop chest or abdominal pain, weakness, or fainting.  You have a fever. Document Released: 06/03/2004 Document Revised: 07/19/2011 Document Reviewed: 05/14/2009 [facility] Patient Information 2015 [place], [place]. This information is not intended to replace advice given to you by your health  care provider. Make sure you discuss any questions you have with your health care provider.

## 2014-12-01 NOTE — ED Notes (Signed)
Patient c/o bilateral leg swollen  X 1 week.  Per pt. Swelling went done x yesterday, however soar today when walking.

## 2014-12-09 ENCOUNTER — Ambulatory Visit (INDEPENDENT_AMBULATORY_CARE_PROVIDER_SITE_OTHER): Payer: Medicare Other | Admitting: Family Medicine

## 2014-12-09 ENCOUNTER — Encounter: Payer: Self-pay | Admitting: Family Medicine

## 2014-12-09 VITALS — BP 140/82 | HR 60 | Ht 68.0 in | Wt 159.0 lb

## 2014-12-09 DIAGNOSIS — I10 Essential (primary) hypertension: Secondary | ICD-10-CM

## 2014-12-09 DIAGNOSIS — G629 Polyneuropathy, unspecified: Secondary | ICD-10-CM | POA: Diagnosis not present

## 2014-12-09 DIAGNOSIS — I872 Venous insufficiency (chronic) (peripheral): Secondary | ICD-10-CM | POA: Insufficient documentation

## 2014-12-09 MED ORDER — POTASSIUM CHLORIDE ER 10 MEQ PO TBCR: 10.0000 meq | EXTENDED_RELEASE_TABLET | ORAL | Status: DC

## 2014-12-09 MED ORDER — FUROSEMIDE 20 MG PO TABS: 20.0000 mg | ORAL_TABLET | Freq: Every day | ORAL | Status: DC

## 2014-12-09 MED ORDER — GABAPENTIN 100 MG PO CAPS
100.0000 mg | ORAL_CAPSULE | Freq: Three times a day (TID) | ORAL | Status: DC
Start: 2014-12-09 — End: 2015-10-07

## 2014-12-09 NOTE — Progress Notes (Signed)
Name: Melissa Cochran   MRN: [id number]    DOB: 1935/08/03   Date:12/09/2014       Progress Note  Subjective  Chief Complaint  Chief Complaint  Patient presents with  . Foot Swelling    and leg swelling- seen in UC- given Lasix and Potassium pills    Cough This is a chronic problem. The current episode started more than 1 month ago. The problem has been waxing and waning. The problem occurs constantly. The cough is non-productive. Pertinent negatives include no chest pain, chills, ear pain, fever, headaches, heartburn, myalgias, nasal congestion, rash, sore throat, shortness of breath, weight loss or wheezing. Nothing aggravates the symptoms. She has tried nothing for the symptoms. There is no history of asthma, COPD, environmental allergies or pneumonia.  Shortness of Breath This is a recurrent problem. The current episode started more than 1 year ago. The problem occurs daily. The problem has been waxing and waning. Associated symptoms include leg swelling. Pertinent negatives include no abdominal pain, chest pain, claudication, ear pain, fever, headaches, neck pain, orthopnea, PND, rash, sore throat, sputum production or wheezing. Nothing aggravates the symptoms. The treatment provided no relief. Her past medical history is significant for chronic lung disease and a heart failure. There is no history of allergies, aspirin allergies, asthma, bronchiolitis, CAD, COPD, DVT, PE, pneumonia or a recent surgery.    No problem-specific assessment & plan notes found for this encounter.   Past Medical History  Diagnosis Date  . Hypertension   . Hyperlipemia   . Heart block     left  . Adnexal mass   . Bleeding ulcer     Past Surgical History  Procedure Laterality Date  . Cardiac surgery      cardiac cath  . Abdominal hysterectomy    . Stomach surgery    . Hip surgery      left    History reviewed. No pertinent family history.  History   Social History  . Marital Status:  Widowed    Spouse Name: N/A  . Number of Children: N/A  . Years of Education: N/A   Occupational History  . Not on file.   Social History Main Topics  . Smoking status: Former Smoker    Quit date: 06/13/1998  . Smokeless tobacco: Not on file  . Alcohol Use: No  . Drug Use: No  . Sexual Activity: Not Currently   Other Topics Concern  . Not on file   Social History Narrative    No Known Allergies   Review of Systems  Constitutional: Negative for fever, chills, weight loss and malaise/fatigue.  HENT: Negative for ear discharge, ear pain and sore throat.   Eyes: Negative for blurred vision.  Respiratory: Negative for cough, sputum production, shortness of breath and wheezing.   Cardiovascular: Positive for leg swelling. Negative for chest pain, palpitations, orthopnea, claudication and PND.  Gastrointestinal: Negative for heartburn, nausea, abdominal pain, diarrhea, constipation, blood in stool and melena.  Genitourinary: Negative for dysuria, urgency, frequency and hematuria.  Musculoskeletal: Negative for myalgias, back pain, joint pain and neck pain.  Skin: Negative for rash.  Neurological: Negative for dizziness, tingling, sensory change, focal weakness and headaches.  Endo/Heme/Allergies: Negative for environmental allergies and polydipsia. Does not bruise/bleed easily.  Psychiatric/Behavioral: Negative for depression and suicidal ideas. The patient is not nervous/anxious and does not have insomnia.      Objective  Filed Vitals:   12/09/14 1002  BP: 140/82  Pulse: 60  Height:  5\' 8"  (1.727 m)  Weight: 159 lb (72.122 kg)    Physical Exam  Constitutional: She is well-developed, well-nourished, and in no distress. No distress.  HENT:  Head: Normocephalic and atraumatic.  Right Ear: External ear normal.  Left Ear: External ear normal.  Nose: Nose normal.  Mouth/Throat: Oropharynx is clear and moist.  Eyes: Conjunctivae and EOM are normal. Pupils are equal,  round, and reactive to light. Right eye exhibits no discharge. Left eye exhibits no discharge.  Neck: Normal range of motion. Neck supple. No JVD present. No thyromegaly present.  Cardiovascular: Normal rate, regular rhythm, normal heart sounds and intact distal pulses.  Exam reveals no gallop and no friction rub.   No murmur heard. Pulmonary/Chest: Effort normal and breath sounds normal.  Abdominal: Soft. Bowel sounds are normal. She exhibits no mass. There is no tenderness. There is no guarding.  Musculoskeletal: Normal range of motion. She exhibits tenderness. She exhibits no edema.       Right foot: There is tenderness.       Left foot: There is tenderness.  Lymphadenopathy:    She has no cervical adenopathy.  Neurological: She is alert. She has normal reflexes.  Skin: Skin is warm and dry. She is not diaphoretic.  Psychiatric: Mood and affect normal.      Assessment & Plan  Problem List Items Addressed This Visit      Cardiovascular and Mediastinum   Venous insufficiency of both lower extremities - Primary   Relevant Medications   furosemide (LASIX) 20 MG tablet   Other Relevant Orders   US Venous Img Lower Bilateral   Essential hypertension   Relevant Medications   potassium chloride (K-DUR) 10 MEQ tablet   furosemide (LASIX) 20 MG tablet   Other Relevant Orders   Ambulatory referral to Cardiology     Nervous and Auditory   Neuropathy   Relevant Medications   gabapentin (NEURONTIN) 100 MG capsule        Dr. Deanna Jones [facility]  12/09/2014

## 2014-12-10 ENCOUNTER — Ambulatory Visit
Admission: RE | Admit: 2014-12-10 | Discharge: 2014-12-10 | Disposition: A | Discharge status: Home / Self Care | Payer: Medicare Other | Source: Ambulatory Visit | Attending: Family Medicine | Admitting: Family Medicine

## 2014-12-10 DIAGNOSIS — I872 Venous insufficiency (chronic) (peripheral): Secondary | ICD-10-CM | POA: Insufficient documentation

## 2014-12-10 DIAGNOSIS — M7989 Other specified soft tissue disorders: Secondary | ICD-10-CM | POA: Diagnosis not present

## 2014-12-10 DIAGNOSIS — M79605 Pain in left leg: Secondary | ICD-10-CM | POA: Diagnosis not present

## 2014-12-10 DIAGNOSIS — M79604 Pain in right leg: Secondary | ICD-10-CM | POA: Diagnosis not present

## 2014-12-26 ENCOUNTER — Ambulatory Visit
Admission: EM | Admit: 2014-12-26 | Discharge: 2014-12-26 | Disposition: A | Discharge status: Home / Self Care | Payer: Medicare Other | Attending: Emergency Medicine | Admitting: Emergency Medicine

## 2014-12-26 DIAGNOSIS — I509 Heart failure, unspecified: Secondary | ICD-10-CM | POA: Diagnosis not present

## 2014-12-26 DIAGNOSIS — R609 Edema, unspecified: Secondary | ICD-10-CM | POA: Diagnosis not present

## 2014-12-26 DIAGNOSIS — G629 Polyneuropathy, unspecified: Secondary | ICD-10-CM

## 2014-12-26 DIAGNOSIS — I872 Venous insufficiency (chronic) (peripheral): Secondary | ICD-10-CM | POA: Diagnosis not present

## 2014-12-26 DIAGNOSIS — I10 Essential (primary) hypertension: Secondary | ICD-10-CM | POA: Insufficient documentation

## 2014-12-26 DIAGNOSIS — E785 Hyperlipidemia, unspecified: Secondary | ICD-10-CM | POA: Diagnosis not present

## 2014-12-26 DIAGNOSIS — R600 Localized edema: Secondary | ICD-10-CM

## 2014-12-26 DIAGNOSIS — Z87891 Personal history of nicotine dependence: Secondary | ICD-10-CM | POA: Diagnosis not present

## 2014-12-26 DIAGNOSIS — I2510 Atherosclerotic heart disease of native coronary artery without angina pectoris: Secondary | ICD-10-CM | POA: Insufficient documentation

## 2014-12-26 LAB — BASIC METABOLIC PANEL
Anion gap: 9 (ref 5–15)
BUN: 27 mg/dL — ABNORMAL HIGH (ref 6–20)
CO2: 27 mmol/L (ref 22–32)
Calcium: 9.4 mg/dL (ref 8.9–10.3)
Chloride: 100 mmol/L — ABNORMAL LOW (ref 101–111)
Creatinine, Ser: 0.67 mg/dL (ref 0.44–1.00)
GFR calc non Af Amer: >60 mL/min (ref >60)
Glucose, Bld: 102 mg/dL — ABNORMAL HIGH (ref 65–99)
POTASSIUM: 4.2 mmol/L (ref 3.5–5.1)
Sodium: 136 mmol/L (ref 135–145)

## 2014-12-26 NOTE — ED Provider Notes (Signed)
HPI  SUBJECTIVE:  Melissa Cochran is a 79 y.o. female who presents with continued bilateral lower extremity edema, burning pain in both her feet for the past 2 weeks. Reports erythema bilaterally. She states the symptoms are slightly better when she takes Lasix, much worse with walking. She has not tried anything else for this. She denies fevers, coughing, wheezing, chest pain, shortness of breath. She reports bilateral lower extremity erythema, weight gain, nocturia. She denies dyspnea on exertion, orthopnea. States that she is compliant with her cardiac diet.  Past medical history per chart review shows congestive heart failure, coronary artery disease, emphysema/chronic lung disease, hypertension, peripheral venous insufficiency.  patient is a difficult historian. Most information taken from chart review. Patient was seen here on 7/24 for bilateral lower extremity edema, started on Lasix and potassium. She was seen by her primary care physician on 8/1, had an ultrasound ordered which was negative for DVT, was referred to cardiology. She was started on Lasix 20 mg daily when necessary patient was take this with potassium. Patient states that she is taking both of these, but that it makes her urinate frequently, so she does not like taking it.  Past Medical History  Diagnosis Date  . Hypertension   . Hyperlipemia   . Heart block     left  . Adnexal mass   . Bleeding ulcer     Past Surgical History  Procedure Laterality Date  . Cardiac surgery      cardiac cath  . Abdominal hysterectomy    . Stomach surgery    . Hip surgery      left    No family history on file.  Social History  Substance Use Topics  . Smoking status: Former Smoker    Quit date: 06/13/1998  . Smokeless tobacco: Not on file  . Alcohol Use: No    No current facility-administered medications for this encounter.  Current outpatient prescriptions:  .  acetaminophen (TYLENOL) 325 MG tablet, Take 650 mg by  mouth every 6 (six) hours as needed., Disp: , Rfl:  .  amLODipine (NORVASC) 2.5 MG tablet, Take 2.5 mg by mouth daily., Disp: , Rfl:  .  aspirin 325 MG tablet, Take 325 mg by mouth daily., Disp: , Rfl:  .  clopidogrel (PLAVIX) 75 MG tablet, Take 75 mg by mouth daily., Disp: , Rfl:  .  furosemide (LASIX) 20 MG tablet, Take 1 tablet (20 mg total) by mouth daily. As needed for leg swelling. Take potassium when taking furosemide., Disp: 30 tablet, Rfl: 2 .  gabapentin (NEURONTIN) 100 MG capsule, Take 1 capsule (100 mg total) by mouth 3 (three) times daily., Disp: 90 capsule, Rfl: 3 .  hydrochlorothiazide (MICROZIDE) 12.5 MG capsule, Take 12.5 mg by mouth daily., Disp: , Rfl:  .  metoprolol succinate (TOPROL-XL) 100 MG 24 hr tablet, Take 100 mg by mouth daily. Take with or immediately following a meal., Disp: , Rfl:  .  potassium chloride (K-DUR) 10 MEQ tablet, Take 1 tablet (10 mEq total) by mouth 2 (two) times a week. Take with doses of furosemide, to keep potassium from getting too low., Disp: 30 tablet, Rfl: 2  No Known Allergies   ROS  As noted in HPI.   Physical Exam  BP 169/67 mmHg  Pulse 63  Temp(Src) 97.1 F (36.2 C) (Tympanic)  Resp 18  Ht 5\' 8"  (1.727 m)  Wt 160 lb (72.576 kg)  BMI 24.33 kg/m2  Constitutional: Well developed, well nourished, no acute  distress. Patient able to lie flat without difficulty. Eyes:  EOMI, conjunctiva normal bilaterally HENT: Normocephalic, atraumatic,mucus membranes moist Respiratory: Normal inspiratory effort. Good air movement lungs clear bilaterally Cardiovascular: Normal rate regular rhythm no murmurs rubs gallops.  GI: nondistended skin: No rash, skin intact Musculoskeletal:Bilateral 1-2+ peripheral edema, calves symmetric DP pulses 2+ and equal bilaterally. Bilateral erythema  which resolves with leg elevation. Skin is intact. No evidence of cellulitis.  Neurologic: Alert & oriented x 3, no focal neuro deficits Psychiatric: Speech and  behavior appropriate   ED Course   Medications - No data to display  Orders Placed This Encounter  Procedures  . Basic metabolic panel    Standing Status: Standing     Number of Occurrences: 1     Standing Expiration Date:     Results for orders placed or performed during the hospital encounter of 12/26/14 (from the past 24 hour(s))  Basic metabolic panel     Status: Abnormal   Collection Time: 12/26/14  7:55 PM  Result Value Ref Range   Sodium 136 135 - 145 mmol/L   Potassium 4.2 3.5 - 5.1 mmol/L   Chloride 100 (L) 101 - 111 mmol/L   CO2 27 22 - 32 mmol/L   Glucose, Bld 102 (H) 65 - 99 mg/dL   BUN 27 (H) 6 - 20 mg/dL   Creatinine, Ser 0.67 0.44 - 1.00 mg/dL   Calcium 9.4 8.9 - 10.3 mg/dL   GFR calc non Af Amer >60 >60 mL/min   GFR calc Af Amer >60 >60 mL/min   Anion gap 9 5 - 15   No results found.  ED Clinical Impression  Venous insufficiency of both lower extremities  Peripheral edema  Neuropathy  ED Assessment/Plan  Reviewed labs. Creatinine normal. Potassium normal. BUN slightly elevated, most recent 25. Suspect that the neuropathy and extremity edema is from venous insufficiency, possibly heat peripheral edema. patient may also have a component of congestive heart failure, however has follow-up with cardiology on September 14. Lungs are clear today.  Advised extremity elevation and TED hose in addition to taking the Lasix as directed. Suspect that she does not take this on a regular basis due to the urinary frequency. Discussed labs, imaging, MDM, plan and followup with patient and family. Discussed sn/sx that should prompt return to the UC or ED.They agree with plan  *This clinic note was created using Dragon dictation software. Therefore, there may be occasional mistakes despite careful proofreading.  ?    Melynda Ripple, MD 12/26/14 2103

## 2014-12-26 NOTE — Discharge Instructions (Signed)
Continue the Lasix and take a potassium when you take the Lasix. This will keep your potassium from getting too low. The Lasix will help draw additional fluid off from your legs. Keep your legs elevated above your heart as much as you possibly can, get a pair of TEDs hose which will also help with the swelling. I think the burning pain will get better once the swelling goes down. Follow-up with Dr. Ronnald Ramp, and the heart specialist as scheduled. Go to the ER for chest pain, shortness of breath, severe swelling in one of your legs, or for other concerns

## 2014-12-26 NOTE — ED Notes (Signed)
Patient states her feet are burning and have been swollen for 2 weeks, "since the last time she was here".

## 2015-01-17 ENCOUNTER — Ambulatory Visit
Admission: EM | Admit: 2015-01-17 | Discharge: 2015-01-17 | Disposition: A | Discharge status: Home / Self Care | Payer: Medicare Other | Attending: Family Medicine | Admitting: Family Medicine

## 2015-01-17 DIAGNOSIS — R197 Diarrhea, unspecified: Secondary | ICD-10-CM | POA: Diagnosis not present

## 2015-01-17 DIAGNOSIS — E785 Hyperlipidemia, unspecified: Secondary | ICD-10-CM | POA: Insufficient documentation

## 2015-01-17 DIAGNOSIS — N390 Urinary tract infection, site not specified: Secondary | ICD-10-CM

## 2015-01-17 DIAGNOSIS — K529 Noninfective gastroenteritis and colitis, unspecified: Secondary | ICD-10-CM | POA: Diagnosis not present

## 2015-01-17 DIAGNOSIS — I10 Essential (primary) hypertension: Secondary | ICD-10-CM | POA: Insufficient documentation

## 2015-01-17 DIAGNOSIS — R1110 Vomiting, unspecified: Secondary | ICD-10-CM | POA: Diagnosis present

## 2015-01-17 LAB — URINALYSIS COMPLETE WITH MICROSCOPIC (ARMC ONLY)
Glucose, UA: NEGATIVE mg/dL
Ketones, ur: NEGATIVE mg/dL
Nitrite: NEGATIVE
Specific Gravity, Urine: >1.03 — ABNORMAL HIGH (ref 1.005–1.030)
pH: 6 (ref 5.0–8.0)

## 2015-01-17 MED ORDER — NITROFURANTOIN MONOHYD MACRO 100 MG PO CAPS: 100.0000 mg | ORAL_CAPSULE | Freq: Two times a day (BID) | ORAL | Status: DC

## 2015-01-17 MED ORDER — ONDANSETRON 8 MG PO TBDP
8.0000 mg | ORAL_TABLET | Freq: Once | ORAL | Status: AC
  Administered 2015-01-17: 8 mg via ORAL

## 2015-01-17 NOTE — ED Notes (Signed)
Pt states "I've been sick all over since Sunday. I have been vomiting and diarrhea. I can't even eat a dry cracker."

## 2015-01-17 NOTE — Discharge Instructions (Signed)
Urinary Tract Infection Urinary tract infections (UTIs) can develop anywhere along your urinary tract. Your urinary tract is your body's drainage system for removing wastes and extra water. Your urinary tract includes two kidneys, two ureters, a bladder, and a urethra. Your kidneys are a pair of bean-shaped organs. Each kidney is about the size of your fist. They are located below your ribs, one on each side of your spine. CAUSES Infections are caused by microbes, which are microscopic organisms, including fungi, viruses, and bacteria. These organisms are so small that they can only be seen through a microscope. Bacteria are the microbes that most commonly cause UTIs. SYMPTOMS  Symptoms of UTIs may vary by age and gender of the patient and by the location of the infection. Symptoms in young women typically include a frequent and intense urge to urinate and a painful, burning feeling in the bladder or urethra during urination. Older women and men are more likely to be tired, shaky, and weak and have muscle aches and abdominal pain. A fever may mean the infection is in your kidneys. Other symptoms of a kidney infection include pain in your back or sides below the ribs, nausea, and vomiting. DIAGNOSIS To diagnose a UTI, your caregiver will ask you about your symptoms. Your caregiver also will ask to provide a urine sample. The urine sample will be tested for bacteria and white blood cells. White blood cells are made by your body to help fight infection. TREATMENT  Typically, UTIs can be treated with medication. Because most UTIs are caused by a bacterial infection, they usually can be treated with the use of antibiotics. The choice of antibiotic and length of treatment depend on your symptoms and the type of bacteria causing your infection. HOME CARE INSTRUCTIONS  If you were prescribed antibiotics, take them exactly as your caregiver instructs you. Finish the medication even if you feel better after you  have only taken some of the medication.  Drink enough water and fluids to keep your urine clear or pale yellow.  Avoid caffeine, tea, and carbonated beverages. They tend to irritate your bladder.  Empty your bladder often. Avoid holding urine for long periods of time.  Empty your bladder before and after sexual intercourse.  After a bowel movement, women should cleanse from front to back. Use each tissue only once. SEEK MEDICAL CARE IF:   You have back pain.  You develop a fever.  Your symptoms do not begin to resolve within 3 days. SEEK IMMEDIATE MEDICAL CARE IF:   You have severe back pain or lower abdominal pain.  You develop chills.  You have nausea or vomiting.  You have continued burning or discomfort with urination. MAKE SURE YOU:   Understand these instructions.  Will watch your condition.  Will get help right away if you are not doing well or get worse. Document Released: 02/03/2005 Document Revised: 10/26/2011 Document Reviewed: 06/04/2011 [facility] Patient Information 2015 [place], [place]. This information is not intended to replace advice given to you by your health care provider. Make sure you discuss any questions you have with your health care provider. Viral Gastroenteritis Viral gastroenteritis is also known as stomach flu. This condition affects the stomach and intestinal tract. It can cause sudden diarrhea and vomiting. The illness typically lasts 3 to 8 days. Most people develop an immune response that eventually gets rid of the virus. While this natural response develops, the virus can make you quite ill. CAUSES  Many different viruses can cause gastroenteritis, such  as rotavirus or noroviruses. You can catch one of these viruses by consuming contaminated food or water. You may also catch a virus by sharing utensils or other personal items with an infected person or by touching a contaminated surface. SYMPTOMS  The most common symptoms are diarrhea and  vomiting. These problems can cause a severe loss of body fluids (dehydration) and a body salt (electrolyte) imbalance. Other symptoms may include:  Fever.  Headache.  Fatigue.  Abdominal pain. DIAGNOSIS  Your caregiver can usually diagnose viral gastroenteritis based on your symptoms and a physical exam. A stool sample may also be taken to test for the presence of viruses or other infections. TREATMENT  This illness typically goes away on its own. Treatments are aimed at rehydration. The most serious cases of viral gastroenteritis involve vomiting so severely that you are not able to keep fluids down. In these cases, fluids must be given through an intravenous line (IV). HOME CARE INSTRUCTIONS   Drink enough fluids to keep your urine clear or pale yellow. Drink small amounts of fluids frequently and increase the amounts as tolerated.  Ask your caregiver for specific rehydration instructions.  Avoid:  Foods high in sugar.  Alcohol.  Carbonated drinks.  Tobacco.  Juice.  Caffeine drinks.  Extremely hot or cold fluids.  Fatty, greasy foods.  Too much intake of anything at one time.  Dairy products until 24 to 48 hours after diarrhea stops.  You may consume probiotics. Probiotics are active cultures of beneficial bacteria. They may lessen the amount and number of diarrheal stools in adults. Probiotics can be found in yogurt with active cultures and in supplements.  Wash your hands well to avoid spreading the virus.  Only take over-the-counter or prescription medicines for pain, discomfort, or fever as directed by your caregiver. Do not give aspirin to children. Antidiarrheal medicines are not recommended.  Ask your caregiver if you should continue to take your regular prescribed and over-the-counter medicines.  Keep all follow-up appointments as directed by your caregiver. SEEK IMMEDIATE MEDICAL CARE IF:   You are unable to keep fluids down.  You do not urinate at  least once every 6 to 8 hours.  You develop shortness of breath.  You notice blood in your stool or vomit. This may look like coffee grounds.  You have abdominal pain that increases or is concentrated in one small area (localized).  You have persistent vomiting or diarrhea.  You have a fever.  The patient is a child younger than 3 months, and he or she has a fever.  The patient is a child older than 3 months, and he or she has a fever and persistent symptoms.  The patient is a child older than 3 months, and he or she has a fever and symptoms suddenly get worse.  The patient is a baby, and he or she has no tears when crying. MAKE SURE YOU:   Understand these instructions.  Will watch your condition.  Will get help right away if you are not doing well or get worse. Document Released: 04/26/2005 Document Revised: 07/19/2011 Document Reviewed: 02/10/2011 [facility] Patient Information 2015 [place], [place]. This information is not intended to replace advice given to you by your health care provider. Make sure you discuss any questions you have with your health care provider.

## 2015-01-18 NOTE — ED Provider Notes (Addendum)
CSN: [id number]     Arrival date & time 01/17/15  1302 History   First MD Initiated Contact with Patient 01/17/15 1434     Chief Complaint  Patient presents with  . Emesis  . Diarrhea   (Consider location/radiation/quality/duration/timing/severity/associated sxs/prior Treatment) HPI Comments: Single caucasian female works at [employer] supposed to work yesterday and today but having nausea, vomiting and diarrhea with po solids intake . Mountain Dew doesn't upset her stomach.  Urinated earlier today without difficulty.  Works in [employer].  No known sick contacts.  Patient ambulating in clinic without difficulty standing in exam room drinking mountain dew refused offer of water.  Requested medication for nausea.  Abdomen pain prior to diarrhea or after vomiting but then resolves.  Decreased appetite.  Community has had large number of gastroenteritis viral cases this month coming into our clinic.  Patient is a 79 y.o. female presenting with vomiting and diarrhea. The history is provided by the patient.  Emesis Severity:  Moderate Duration:  6 days Timing:  Intermittent Number of daily episodes:  2 Quality:  Stomach contents and undigested food Able to tolerate:  Liquids Onset of vomiting after eating: varies. Progression:  Unchanged Chronicity:  New Recent urination:  Normal Context: not post-tussive and not self-induced   Relieved by:  Nothing Ineffective treatments:  Liquids Associated symptoms: abdominal pain, chills and diarrhea   Associated symptoms: no arthralgias, no fever, no headaches, no myalgias, no sore throat and no URI   Diarrhea:    Number of occurrences:  5   Severity:  Moderate   Duration:  6 days   Timing:  Intermittent   Progression:  Unchanged Risk factors: no alcohol use, no diabetes, not pregnant now, no sick contacts, no suspect food intake and no travel to endemic areas   Diarrhea Associated symptoms: abdominal pain, chills and vomiting   Associated  symptoms: no arthralgias, no diaphoresis, no fever, no headaches, no myalgias and no URI     Past Medical History  Diagnosis Date  . Hypertension   . Hyperlipemia   . Heart block     left  . Adnexal mass   . Bleeding ulcer    Past Surgical History  Procedure Laterality Date  . Cardiac surgery      cardiac cath  . Abdominal hysterectomy    . Stomach surgery    . Hip surgery      left   No family history on file. Social History  Substance Use Topics  . Smoking status: Former Smoker    Quit date: 06/13/1998  . Smokeless tobacco: None  . Alcohol Use: No   OB History    No data available     Review of Systems  Constitutional: Positive for chills, appetite change and fatigue. Negative for fever, diaphoresis and activity change.  HENT: Negative for congestion, dental problem, drooling, ear discharge, ear pain, facial swelling, hearing loss, mouth sores, nosebleeds, postnasal drip, rhinorrhea, sinus pressure, sneezing, sore throat, tinnitus, trouble swallowing and voice change.   Eyes: Negative for photophobia, pain, discharge, redness, itching and visual disturbance.  Respiratory: Negative for cough, choking, shortness of breath, wheezing and stridor.   Cardiovascular: Negative for chest pain, palpitations and leg swelling.  Gastrointestinal: Positive for nausea, vomiting, abdominal pain and diarrhea. Negative for constipation, blood in stool, abdominal distention, anal bleeding and rectal pain.  Endocrine: Negative for cold intolerance and heat intolerance.  Genitourinary: Negative for dysuria, urgency, hematuria and difficulty urinating.  Musculoskeletal: Negative for myalgias, back  pain, joint swelling, arthralgias, gait problem, neck pain and neck stiffness.  Skin: Negative for color change, pallor, rash and wound.  Allergic/Immunologic: Negative for environmental allergies and food allergies.  Neurological: Negative for dizziness, tremors, seizures, syncope, facial  asymmetry, speech difficulty, weakness, light-headedness, numbness and headaches.  Hematological: Negative for adenopathy. Does not bruise/bleed easily.  Psychiatric/Behavioral: Negative for behavioral problems, confusion, sleep disturbance and agitation.    Allergies  Review of patient's allergies indicates no known allergies.  Home Medications   Prior to Admission medications   Medication Sig Start Date End Date Taking? Authorizing Provider  acetaminophen (TYLENOL) 325 MG tablet Take 650 mg by mouth every 6 (six) hours as needed.    Historical Provider, MD  amLODipine (NORVASC) 2.5 MG tablet Take 2.5 mg by mouth daily.    Historical Provider, MD  aspirin 325 MG tablet Take 325 mg by mouth daily.    Historical Provider, MD  clopidogrel (PLAVIX) 75 MG tablet Take 75 mg by mouth daily.    Historical Provider, MD  furosemide (LASIX) 20 MG tablet Take 1 tablet (20 mg total) by mouth daily. As needed for leg swelling. Take potassium when taking furosemide. 12/09/14   [name], MD  gabapentin (NEURONTIN) 100 MG capsule Take 1 capsule (100 mg total) by mouth 3 (three) times daily. 12/09/14   [name], MD  hydrochlorothiazide (MICROZIDE) 12.5 MG capsule Take 12.5 mg by mouth daily.    Historical Provider, MD  metoprolol succinate (TOPROL-XL) 100 MG 24 hr tablet Take 100 mg by mouth daily. Take with or immediately following a meal.    Historical Provider, MD  nitrofurantoin, macrocrystal-monohydrate, (MACROBID) 100 MG capsule Take 1 capsule (100 mg total) by mouth 2 (two) times daily. 01/17/15   Olen Cordial, NP  potassium chloride (K-DUR) 10 MEQ tablet Take 1 tablet (10 mEq total) by mouth 2 (two) times a week. Take with doses of furosemide, to keep potassium from getting too low. 12/09/14   [name], MD   Meds Ordered and Administered this Visit   Medications  ondansetron (ZOFRAN-ODT) disintegrating tablet 8 mg (8 mg Oral Given 01/17/15 1415)    BP 182/60 mmHg  Temp(Src) 98.1 F  (36.7 C) (Tympanic)  Resp 34  Ht 5\' 8"  (1.727 m)  Wt 155 lb (70.308 kg)  BMI 23.57 kg/m2  SpO2 97% No data found.   Physical Exam  Constitutional: She is oriented to person, place, and time. Vital signs are normal. She appears well-developed and well-nourished. No distress.  HENT:  Head: Normocephalic and atraumatic.  Right Ear: External ear normal.  Left Ear: External ear normal.  Nose: Nose normal.  Mouth/Throat: Oropharynx is clear and moist. No oropharyngeal exudate.  Eyes: Conjunctivae, EOM and lids are normal. Pupils are equal, round, and reactive to light. Right eye exhibits no discharge. Left eye exhibits no discharge. No scleral icterus.  Neck: Trachea normal and normal range of motion. Neck supple. No tracheal deviation present. No thyromegaly present.  Cardiovascular: Normal rate, regular rhythm, normal heart sounds and intact distal pulses.  Exam reveals no gallop and no friction rub.   No murmur heard. Pulmonary/Chest: Effort normal and breath sounds normal. No accessory muscle usage or stridor. No respiratory distress. She has no decreased breath sounds. She has no wheezes. She has no rhonchi. She has no rales. She exhibits no tenderness.  Abdominal: Soft. She exhibits no shifting dullness, no distension, no pulsatile liver, no fluid wave, no abdominal bruit, no ascites, no  pulsatile midline mass and no mass. Bowel sounds are decreased. There is no hepatosplenomegaly. There is no tenderness. There is no rigidity, no rebound, no guarding, no CVA tenderness, no tenderness at McBurney's point and negative Murphy's sign. No hernia. Hernia confirmed negative in the ventral area.  Dull to percussion x 4 quads  Musculoskeletal: Normal range of motion. She exhibits no edema or tenderness.  Lymphadenopathy:    She has no cervical adenopathy.  Neurological: She is alert and oriented to person, place, and time. She exhibits normal muscle tone. Coordination normal.  Skin: Skin is warm,  dry and intact. No abrasion, no bruising, no burn, no ecchymosis, no laceration, no lesion, no petechiae and no rash noted. She is not diaphoretic. No cyanosis or erythema. No pallor. Nails show no clubbing.  Psychiatric: She has a normal mood and affect. Her speech is normal and behavior is normal. Judgment and thought content normal. Cognition and memory are normal.  Nursing note and vitals reviewed.   ED Course  Procedures (including critical care time)  Labs Review Labs Reviewed  URINALYSIS COMPLETEWITH MICROSCOPIC ([facility] ONLY) - Abnormal; Notable for the following:    Color, Urine AMBER (*)    APPearance HAZY (*)    Bilirubin Urine 1+ (*)    Specific Gravity, Urine >1.030 (*)    Hgb urine dipstick 2+ (*)    Protein, ur >300 (*)    Leukocytes, UA TRACE (*)    Bacteria, UA MANY (*)    Squamous Epithelial / LPF 0-5 (*)    All other components within normal limits    Imaging Review No results found.  Medications  ondansetron (ZOFRAN-ODT) disintegrating tablet 8 mg (8 mg Oral Given 01/17/15 1415)  given by RN Raylene Miyamoto  Patient reported nausea resolved with zofran in clinic  Discussed preferred not to have long term zofran or phenergan po over weekend at home due to interactions with her chronic medications.  Patient will utilize ginger ale, diet modification.  Hydrate, hydrate, hydrate as tongue geographic today and urine concentrated.   UTI treatment also should help GI symptoms as could be contributing to them.  Patient will contact clinic if not able to control nausea with ginger ale, bland diet and maintain hydration.  Patient verbalized understanding of information/instructions and agreed with plan of care.  Filed Vitals:   01/17/15 1402  BP: 182/60  Temp: 98.1 F (36.7 C)  Resp: 34  HR 65   MDM   1. UTI (lower urinary tract infection)   2. Gastroenteritis, acute    Medications as directed.  Patient is also to push fluids.  Macrobid 100mg  po BID will call with  urine culture results once available typically 48 hours.  Urinalysis results discussed with patient and given copy of urinalysis results. Call or return to clinic as needed if these symptoms worsen or fail to improve as anticipated.  Exitcare handout on cystitis given to patient Patient verbalized agreement and understanding of treatment plan and had no further questions at this time. P2:  Hydrate, and cranberry juice  Patient given work excuse for 72 hours (yesterday through Saturday 10 Sep).  I have recommended clear fluids and the BRAT/bland diet. Hold po intake 1 hour if vomits then restart clear liquids.  Advance to soft/bland if tolerates clears x 8 hours without vomiting/worsening of GI symptoms.   Avoid dairy, spicy, fried foods and large portions of meat. Medications as directed.  Return to the clinic if  symptoms persist or worsen; I  have alerted the patient to call if high fever, unable to urinate every 8 hours, dehydration, marked weakness, fainting, increased abdominal pain, blood in stool or vomit.  Patient verbalized agreement and understanding of treatment plan.   P2:  Hand washing and fitness    Olen Cordial, NP 01/18/15 2302  24 Jan 2015 at [place] Telephone message left for patient to contact clinic urine culture contaminated and wishing to clarify if her symptoms have resolved.  Olen Cordial, NP 01/24/15 [phone]

## 2015-01-21 LAB — URINE CULTURE: SPECIAL REQUESTS: NORMAL

## 2015-01-24 ENCOUNTER — Emergency Department: Payer: Medicare Other

## 2015-01-24 ENCOUNTER — Ambulatory Visit (INDEPENDENT_AMBULATORY_CARE_PROVIDER_SITE_OTHER): Payer: Medicare Other | Admitting: Family Medicine

## 2015-01-24 ENCOUNTER — Inpatient Hospital Stay
Admission: EM | Admit: 2015-01-24 | Discharge: 2015-01-28 | DRG: 193 | Disposition: A | Discharge status: Skilled Nursing Facility | Payer: Medicare Other | Attending: Internal Medicine | Admitting: Internal Medicine

## 2015-01-24 ENCOUNTER — Other Ambulatory Visit: Payer: Self-pay

## 2015-01-24 ENCOUNTER — Encounter: Payer: Self-pay | Admitting: Medical Oncology

## 2015-01-24 VITALS — BP 130/80 | HR 100 | Wt 151.0 lb

## 2015-01-24 DIAGNOSIS — R9431 Abnormal electrocardiogram [ECG] [EKG]: Secondary | ICD-10-CM

## 2015-01-24 DIAGNOSIS — Z800 Family history of malignant neoplasm of digestive organs: Secondary | ICD-10-CM

## 2015-01-24 DIAGNOSIS — Z955 Presence of coronary angioplasty implant and graft: Secondary | ICD-10-CM

## 2015-01-24 DIAGNOSIS — K219 Gastro-esophageal reflux disease without esophagitis: Secondary | ICD-10-CM | POA: Diagnosis present

## 2015-01-24 DIAGNOSIS — R0602 Shortness of breath: Secondary | ICD-10-CM

## 2015-01-24 DIAGNOSIS — I2510 Atherosclerotic heart disease of native coronary artery without angina pectoris: Secondary | ICD-10-CM | POA: Diagnosis present

## 2015-01-24 DIAGNOSIS — I10 Essential (primary) hypertension: Secondary | ICD-10-CM | POA: Diagnosis present

## 2015-01-24 DIAGNOSIS — Z90710 Acquired absence of both cervix and uterus: Secondary | ICD-10-CM | POA: Diagnosis not present

## 2015-01-24 DIAGNOSIS — R262 Difficulty in walking, not elsewhere classified: Secondary | ICD-10-CM | POA: Diagnosis not present

## 2015-01-24 DIAGNOSIS — Z9889 Other specified postprocedural states: Secondary | ICD-10-CM | POA: Diagnosis not present

## 2015-01-24 DIAGNOSIS — I5021 Acute systolic (congestive) heart failure: Secondary | ICD-10-CM

## 2015-01-24 DIAGNOSIS — Z87440 Personal history of urinary (tract) infections: Secondary | ICD-10-CM | POA: Diagnosis not present

## 2015-01-24 DIAGNOSIS — Z87891 Personal history of nicotine dependence: Secondary | ICD-10-CM

## 2015-01-24 DIAGNOSIS — Z823 Family history of stroke: Secondary | ICD-10-CM | POA: Diagnosis not present

## 2015-01-24 DIAGNOSIS — J189 Pneumonia, unspecified organism: Secondary | ICD-10-CM | POA: Diagnosis not present

## 2015-01-24 DIAGNOSIS — I213 ST elevation (STEMI) myocardial infarction of unspecified site: Secondary | ICD-10-CM | POA: Diagnosis not present

## 2015-01-24 DIAGNOSIS — Z79899 Other long term (current) drug therapy: Secondary | ICD-10-CM

## 2015-01-24 DIAGNOSIS — R079 Chest pain, unspecified: Secondary | ICD-10-CM | POA: Diagnosis not present

## 2015-01-24 DIAGNOSIS — R0609 Other forms of dyspnea: Secondary | ICD-10-CM | POA: Diagnosis not present

## 2015-01-24 DIAGNOSIS — I214 Non-ST elevation (NSTEMI) myocardial infarction: Secondary | ICD-10-CM | POA: Diagnosis present

## 2015-01-24 DIAGNOSIS — E785 Hyperlipidemia, unspecified: Secondary | ICD-10-CM | POA: Diagnosis present

## 2015-01-24 DIAGNOSIS — J9601 Acute respiratory failure with hypoxia: Secondary | ICD-10-CM | POA: Diagnosis not present

## 2015-01-24 DIAGNOSIS — J449 Chronic obstructive pulmonary disease, unspecified: Secondary | ICD-10-CM | POA: Diagnosis present

## 2015-01-24 DIAGNOSIS — M6281 Muscle weakness (generalized): Secondary | ICD-10-CM | POA: Diagnosis not present

## 2015-01-24 DIAGNOSIS — J181 Lobar pneumonia, unspecified organism: Secondary | ICD-10-CM | POA: Diagnosis not present

## 2015-01-24 LAB — URINALYSIS COMPLETE WITH MICROSCOPIC (ARMC ONLY)
BILIRUBIN URINE: NEGATIVE
Bacteria, UA: NONE SEEN
GLUCOSE, UA: NEGATIVE mg/dL
NITRITE: NEGATIVE
PROTEIN: 100 mg/dL — AB
SPECIFIC GRAVITY, URINE: 1.025 (ref 1.005–1.030)
pH: 6 (ref 5.0–8.0)

## 2015-01-24 LAB — CBC
HCT: 40 % (ref 35.0–47.0)
Hemoglobin: 13.3 g/dL (ref 12.0–16.0)
MCH: 30.7 pg (ref 26.0–34.0)
MCHC: 33.2 g/dL (ref 32.0–36.0)
MCV: 92.6 fL (ref 80.0–100.0)
PLATELETS: 399 10*3/uL (ref 150–440)
RBC: 4.32 MIL/uL (ref 3.80–5.20)
RDW: 12.9 % (ref 11.5–14.5)
WBC: 23.1 10*3/uL — AB (ref 3.6–11.0)

## 2015-01-24 LAB — BASIC METABOLIC PANEL
ANION GAP: 12 (ref 5–15)
BUN: 27 mg/dL — ABNORMAL HIGH (ref 6–20)
CO2: 27 mmol/L (ref 22–32)
Calcium: 9.9 mg/dL (ref 8.9–10.3)
Chloride: 97 mmol/L — ABNORMAL LOW (ref 101–111)
Creatinine, Ser: 0.7 mg/dL (ref 0.44–1.00)
GFR calc Af Amer: >60 mL/min (ref >60)
Glucose, Bld: 145 mg/dL — ABNORMAL HIGH (ref 65–99)
POTASSIUM: 3.4 mmol/L — AB (ref 3.5–5.1)
SODIUM: 136 mmol/L (ref 135–145)

## 2015-01-24 LAB — TROPONIN I
TROPONIN I: 0.04 ng/mL — AB (ref <0.031)
Troponin I: 0.03 ng/mL (ref <0.031)

## 2015-01-24 LAB — PROTIME-INR
INR: 1.19
Prothrombin Time: 15.3 seconds — ABNORMAL HIGH (ref 11.4–15.0)

## 2015-01-24 LAB — BLOOD GAS, ARTERIAL
Acid-Base Excess: 5.2 mmol/L — ABNORMAL HIGH (ref 0.0–3.0)
Allens test (pass/fail): POSITIVE — AB
Bicarbonate: 27.4 mEq/L (ref 21.0–28.0)
FIO2: 0.21
O2 SAT: 94.1 %
PCO2 ART: 32 mmHg (ref 32.0–48.0)
PH ART: 7.54 — AB (ref 7.350–7.450)
PO2 ART: 62 mmHg — AB (ref 83.0–108.0)
Patient temperature: 37

## 2015-01-24 LAB — LACTIC ACID, PLASMA
LACTIC ACID, VENOUS: 2 mmol/L (ref 0.5–2.0)
Lactic Acid, Venous: 1.8 mmol/L (ref 0.5–2.0)

## 2015-01-24 LAB — TYPE AND SCREEN
ABO/RH(D): A NEG
Antibody Screen: NEGATIVE

## 2015-01-24 LAB — APTT: APTT: 36 s (ref 24–36)

## 2015-01-24 LAB — ABO/RH: ABO/RH(D): A NEG

## 2015-01-24 LAB — BRAIN NATRIURETIC PEPTIDE: B NATRIURETIC PEPTIDE 5: 206 pg/mL — AB (ref 0.0–100.0)

## 2015-01-24 MED ORDER — POTASSIUM CHLORIDE CRYS ER 20 MEQ PO TBCR
20.0000 meq | EXTENDED_RELEASE_TABLET | Freq: Two times a day (BID) | ORAL | Status: DC
  Administered 2015-01-24 – 2015-01-28 (×8): 20 meq via ORAL
  Filled 2015-01-24 (×8): qty 1

## 2015-01-24 MED ORDER — LEVOFLOXACIN IN D5W 750 MG/150ML IV SOLN
750.0000 mg | Freq: Once | INTRAVENOUS | Status: AC
  Administered 2015-01-24: 750 mg via INTRAVENOUS
  Filled 2015-01-24: qty 150

## 2015-01-24 MED ORDER — ACETAMINOPHEN 325 MG PO TABS
ORAL_TABLET | ORAL | Status: AC
  Administered 2015-01-24: 650 mg via ORAL
  Filled 2015-01-24: qty 2

## 2015-01-24 MED ORDER — ACETAMINOPHEN 325 MG PO TABS: 650.0000 mg | ORAL_TABLET | Freq: Four times a day (QID) | ORAL | Status: DC | PRN

## 2015-01-24 MED ORDER — AMLODIPINE BESYLATE 5 MG PO TABS
2.5000 mg | ORAL_TABLET | Freq: Every day | ORAL | Status: DC
  Administered 2015-01-25 – 2015-01-26 (×2): 2.5 mg via ORAL
  Filled 2015-01-24 (×2): qty 1

## 2015-01-24 MED ORDER — ONDANSETRON HCL 4 MG/2ML IJ SOLN: 4.0000 mg | Freq: Four times a day (QID) | INTRAMUSCULAR | Status: DC | PRN

## 2015-01-24 MED ORDER — ONDANSETRON HCL 4 MG PO TABS: 4.0000 mg | ORAL_TABLET | Freq: Four times a day (QID) | ORAL | Status: DC | PRN

## 2015-01-24 MED ORDER — FUROSEMIDE 10 MG/ML IJ SOLN
40.0000 mg | Freq: Once | INTRAMUSCULAR | Status: AC
  Administered 2015-01-24: 40 mg via INTRAVENOUS
  Filled 2015-01-24: qty 4

## 2015-01-24 MED ORDER — ACETAMINOPHEN 650 MG RE SUPP: 650.0000 mg | Freq: Four times a day (QID) | RECTAL | Status: DC | PRN

## 2015-01-24 MED ORDER — SODIUM CHLORIDE 0.9 % IJ SOLN
3.0000 mL | Freq: Two times a day (BID) | INTRAMUSCULAR | Status: DC
  Administered 2015-01-25 – 2015-01-28 (×5): 3 mL via INTRAVENOUS

## 2015-01-24 MED ORDER — METOPROLOL TARTRATE 100 MG PO TABS
100.0000 mg | ORAL_TABLET | Freq: Two times a day (BID) | ORAL | Status: DC
  Administered 2015-01-25 – 2015-01-28 (×7): 100 mg via ORAL
  Filled 2015-01-24 (×2): qty 1
  Filled 2015-01-24: qty 2
  Filled 2015-01-24 (×4): qty 1

## 2015-01-24 MED ORDER — LEVOFLOXACIN IN D5W 750 MG/150ML IV SOLN
750.0000 mg | INTRAVENOUS | Status: DC
  Administered 2015-01-25 – 2015-01-26 (×2): 750 mg via INTRAVENOUS
  Filled 2015-01-24 (×3): qty 150

## 2015-01-24 MED ORDER — ACETAMINOPHEN 325 MG PO TABS
650.0000 mg | ORAL_TABLET | Freq: Once | ORAL | Status: AC
  Administered 2015-01-24: 650 mg via ORAL

## 2015-01-24 MED ORDER — VANCOMYCIN HCL IN DEXTROSE 750-5 MG/150ML-% IV SOLN
750.0000 mg | Freq: Once | INTRAVENOUS | Status: DC
  Filled 2015-01-24: qty 150

## 2015-01-24 MED ORDER — ACETAMINOPHEN 325 MG PO TABS
650.0000 mg | ORAL_TABLET | Freq: Four times a day (QID) | ORAL | Status: DC | PRN
  Administered 2015-01-25 – 2015-01-27 (×7): 650 mg via ORAL
  Filled 2015-01-24 (×7): qty 2

## 2015-01-24 MED ORDER — ENOXAPARIN SODIUM 40 MG/0.4ML ~~LOC~~ SOLN
40.0000 mg | SUBCUTANEOUS | Status: DC
  Administered 2015-01-24 – 2015-01-27 (×4): 40 mg via SUBCUTANEOUS
  Filled 2015-01-24 (×4): qty 0.4

## 2015-01-24 MED ORDER — PIPERACILLIN-TAZOBACTAM 3.375 G IVPB: 3.3750 g | Freq: Once | INTRAVENOUS | Status: DC

## 2015-01-24 MED ORDER — SODIUM CHLORIDE 0.9 % IV SOLN
INTRAVENOUS | Status: DC
  Administered 2015-01-24 – 2015-01-26 (×4): via INTRAVENOUS

## 2015-01-24 MED ORDER — CLOPIDOGREL BISULFATE 75 MG PO TABS
75.0000 mg | ORAL_TABLET | Freq: Every day | ORAL | Status: DC
  Administered 2015-01-25 – 2015-01-28 (×4): 75 mg via ORAL
  Filled 2015-01-24 (×4): qty 1

## 2015-01-24 NOTE — Progress Notes (Signed)
Name: Melissa Cochran   MRN: [id number]    DOB: 1935-09-04   Date:01/24/2015       Progress Note  Subjective  Chief Complaint  No chief complaint on file.   Shortness of Breath This is a chronic problem. The current episode started today. The problem has been waxing and waning. Associated symptoms include orthopnea, PND and wheezing. Pertinent negatives include no abdominal pain, chest pain, claudication, coryza, ear pain, fever, headaches, hemoptysis, leg pain, leg swelling, neck pain, rash, sore throat, sputum production or syncope. The symptoms are aggravated by exercise. The patient has no known risk factors for DVT/PE. Treatments tried: uti treated with antibiotic. The treatment provided moderate relief. Her past medical history is significant for bronchiolitis, CAD, a heart failure and pneumonia. There is no history of allergies, aspirin allergies, asthma, chronic lung disease, DVT or PE. (Hx of stents)    No problem-specific assessment & plan notes found for this encounter.   Past Medical History  Diagnosis Date  . Hypertension   . Hyperlipemia   . Heart block     left  . Adnexal mass   . Bleeding ulcer     Past Surgical History  Procedure Laterality Date  . Cardiac surgery      cardiac cath  . Abdominal hysterectomy    . Stomach surgery    . Hip surgery      left    No family history on file.  Social History   Social History  . Marital Status: Widowed    Spouse Name: N/A  . Number of Children: N/A  . Years of Education: N/A   Occupational History  . Not on file.   Social History Main Topics  . Smoking status: Former Smoker    Quit date: 06/13/1998  . Smokeless tobacco: Not on file  . Alcohol Use: No  . Drug Use: No  . Sexual Activity: Not Currently   Other Topics Concern  . Not on file   Social History Narrative    No Known Allergies   Review of Systems  Constitutional: Negative for fever, chills, weight loss and malaise/fatigue.  HENT:  Negative for ear discharge, ear pain and sore throat.   Eyes: Negative for blurred vision.  Respiratory: Positive for cough, shortness of breath and wheezing. Negative for hemoptysis and sputum production.   Cardiovascular: Positive for orthopnea and PND. Negative for chest pain, palpitations, claudication, leg swelling and syncope.  Gastrointestinal: Negative for heartburn, nausea, abdominal pain, diarrhea, constipation, blood in stool and melena.  Genitourinary: Negative for dysuria, urgency, frequency and hematuria.  Musculoskeletal: Negative for myalgias, back pain, joint pain and neck pain.  Skin: Negative for rash.  Neurological: Negative for dizziness, tingling, sensory change, focal weakness and headaches.  Endo/Heme/Allergies: Negative for environmental allergies and polydipsia. Does not bruise/bleed easily.  Psychiatric/Behavioral: Negative for depression and suicidal ideas. The patient is nervous/anxious. The patient does not have insomnia.      Objective  Filed Vitals:   01/24/15 1448  BP: 130/80  Pulse: 100  Weight: 151 lb (68.493 kg)  SpO2: 96%    Physical Exam  Constitutional: She is well-developed, well-nourished, and in no distress. No distress.  HENT:  Head: Normocephalic and atraumatic.  Right Ear: External ear normal.  Left Ear: External ear normal.  Nose: Nose normal.  Mouth/Throat: Oropharynx is clear and moist.  Eyes: Conjunctivae and EOM are normal. Pupils are equal, round, and reactive to light. Right eye exhibits no discharge. Left eye exhibits no  discharge.  Neck: Normal range of motion. Neck supple. No JVD present. No thyromegaly present.  Cardiovascular: Normal rate, regular rhythm, normal heart sounds and intact distal pulses.  Exam reveals no gallop and no friction rub.   No murmur heard. Pulmonary/Chest: Effort normal and breath sounds normal.  Abdominal: Soft. Bowel sounds are normal. She exhibits no mass. There is no tenderness. There is no  guarding.  Musculoskeletal: Normal range of motion. She exhibits no edema.  Lymphadenopathy:    She has no cervical adenopathy.  Neurological: She is alert. She has normal reflexes.  Skin: Skin is warm and dry. She is not diaphoretic.  Psychiatric: Mood and affect normal.      Assessment & Plan  Problem List Items Addressed This Visit    None    Visit Diagnoses    Dyspnea on exertion    -  Primary    eval for pneumonia    Relevant Orders    EKG 12-Lead (Completed)    Coronary artery disease involving native coronary artery of native heart without angina pectoris        referral to er    Acute systolic congestive heart failure             Dr. [facility]  01/24/2015

## 2015-01-24 NOTE — Plan of Care (Signed)
Problem: Discharge Progression Outcomes Goal: Discharge plan in place and appropriate Individualization Pt lives at home with her daughter-sent from Drs office to ED Hx of HTN, hyperlipidemia controlled by home meds

## 2015-01-24 NOTE — ED Notes (Signed)
Pt up to the restroom with one assist. Pt able to ambulate independently with no distress noted. Pt now eating.

## 2015-01-24 NOTE — ED Provider Notes (Signed)
[facility] Emergency Department Provider Note  ____________________________________________  Time seen: Approximately 5:59 PM  I have reviewed the triage vital signs and the nursing notes.   HISTORY  Chief Complaint Shortness of Breath  History is difficult to obtain because patient is a difficult historian.  HPI Melissa Cochran is a 79 y.o. female with a history of CAD on Plavix, sent from her primary care physician's office for dyspnea. At this time, the patient does not have any medical complaints and states that she is in her usual state of health and has not had any problems except for recent urinary tract infection which has been treated with Macrobid. The patient describes that she has chronic shortness of breath, a chronic cough which may be mildly increased but with no increase in sputum production, and she denies any fever, chills, nausea or vomiting, diarrhea, abdominal pain, palpitations, or syncope.    Past Medical History  Diagnosis Date  . Hypertension   . Hyperlipemia   . Heart block     left  . Adnexal mass   . Bleeding ulcer     Patient Active Problem List   Diagnosis Date Noted  . Pneumonia 01/24/2015  . Venous insufficiency of both lower extremities 12/09/2014  . Neuropathy 12/09/2014  . Essential hypertension 12/09/2014    Past Surgical History  Procedure Laterality Date  . Cardiac surgery      cardiac cath  . Abdominal hysterectomy    . Stomach surgery    . Hip surgery      left    No current outpatient prescriptions on file.  Allergies Review of patient's allergies indicates no known allergies.  Family History  Problem Relation Age of Onset  . Stomach cancer Mother   . CVA Father     Social History Social History  Substance Use Topics  . Smoking status: Former Smoker -- 1.00 packs/day for 40 years    Quit date: 06/13/1998  . Smokeless tobacco: None  . Alcohol Use: No    Review of  Systems Constitutional: No fever/chills Eyes: No visual changes. ENT: No sore throat. Cardiovascular: Denies chest pain, palpitations. Respiratory: Denies shortness of breath.  No cough. Gastrointestinal: No abdominal pain.  No nausea, no vomiting.  No diarrhea.  No constipation. Genitourinary: Recent treatment for UTI. Increased urinary production after patient was given Lasix. Musculoskeletal: Negative for back pain. Skin: Negative for rash. Neurological: Negative for headaches, focal weakness or numbness.  10-point ROS otherwise negative.  ____________________________________________   PHYSICAL EXAM:  VITAL SIGNS: ED Triage Vitals  Enc Vitals Group     BP 01/24/15 1715 184/78 mmHg     Pulse Rate 01/24/15 1715 106     Resp 01/24/15 1715 24     Temp 01/24/15 1715 98.8 F (37.1 C)     Temp Source 01/24/15 1715 Oral     SpO2 01/24/15 1715 94 %     Weight 01/24/15 1715 151 lb (68.493 kg)     Height 01/24/15 1715 5\' 8"  (1.727 m)     Head Cir --      Peak Flow --      Pain Score --      Pain Loc --      Pain Edu? --      Excl. in [place]? --     Constitutional: Alert and oriented, answering questions appropriately. The patient is sitting in the stretcher with increased work of breathing, but able to speak in 4-5 word sentences. Eyes:  Conjunctivae are normal.  EOMI. Head: Atraumatic. Nose: No congestion/rhinnorhea. Mouth/Throat: Mucous membranes are dry.  Neck: No stridor.  Supple.  No JVD. Cardiovascular: Tachycardia, regular rhythm. No murmurs, rubs or gallops.  Respiratory: Patient has increased work of breathing. Positive for accessory muscle use and retractions. She has rales in the bases bilaterally with the right greater than left. No wheezing. No rhonchi.  Gastrointestinal: Soft and nontender. No distention. No peritoneal signs. Musculoskeletal: Bilateral symmetric pitting lower extremity edema to the mid tibial shaft. No calf tenderness or palpable cords. Neurologic:   Normal speech and language. No gross focal neurologic deficits are appreciated.  Skin:  Skin is warm, dry and intact. No rash noted. Psychiatric: Mood and affect are normal. Speech and behavior are normal.  Normal judgement.  ____________________________________________   LABS (all labs ordered are listed, but only abnormal results are displayed)  Labs Reviewed  CBC - Abnormal; Notable for the following:    WBC 23.1 (*)    All other components within normal limits  BASIC METABOLIC PANEL - Abnormal; Notable for the following:    Potassium 3.4 (*)    Chloride 97 (*)    Glucose, Bld 145 (*)    BUN 27 (*)    All other components within normal limits  PROTIME-INR - Abnormal; Notable for the following:    Prothrombin Time 15.3 (*)    All other components within normal limits  BLOOD GAS, ARTERIAL - Abnormal; Notable for the following:    pH, Arterial 7.54 (*)    pO2, Arterial 62 (*)    Acid-Base Excess 5.2 (*)    Allens test (pass/fail) POSITIVE (*)    All other components within normal limits  BRAIN NATRIURETIC PEPTIDE - Abnormal; Notable for the following:    B Natriuretic Peptide 206.0 (*)    All other components within normal limits  URINALYSIS COMPLETEWITH MICROSCOPIC ([facility] ONLY) - Abnormal; Notable for the following:    Color, Urine AMBER (*)    APPearance HAZY (*)    Ketones, ur TRACE (*)    Hgb urine dipstick 2+ (*)    Protein, ur 100 (*)    Leukocytes, UA 1+ (*)    Squamous Epithelial / LPF 6-30 (*)    All other components within normal limits  TROPONIN I - Abnormal; Notable for the following:    Troponin I 0.04 (*)    All other components within normal limits  CULTURE, BLOOD (ROUTINE X 2)  CULTURE, BLOOD (ROUTINE X 2)  URINE CULTURE  APTT  LACTIC ACID, PLASMA  LACTIC ACID, PLASMA  TROPONIN I  BASIC METABOLIC PANEL  CBC  TROPONIN I  TROPONIN I  TYPE AND SCREEN  ABO/RH   ____________________________________________  EKG  EKG at 1720 shows rate of 110,  sinus tachycardia, 1 mm ST elevation in V1, ST depressions that are 2-3 mm in V4 V5 and V6. This is changed from previous EKG. The patient has normal axis and normal intervals. Next  Repeat EKG performed at 1739 shows rate of 103, sinus tachycardia, continued 0.5 mm elevation in V1 and 1 mm depressions in V4 V5 and V6 that are improved compared to prior EKG. X  With these EKGs are compared to EKG which was brought from clinic which shows rate of 94, normal sinus rhythm, 0.5 mm ST elevation in V1, and poor baseline tracing. ____________________________________________  RADIOLOGY  Dg Chest Portable 1 View  01/24/2015   CLINICAL DATA:  Increasingly severe shortness of breath today, history of COPD,  CHF  EXAM: PORTABLE CHEST - 1 VIEW  COMPARISON:  09/09/2013  FINDINGS: The heart size is upper normal. Vascular pattern is normal. Left mildly prominent left perihilar markings. There is infiltrate in the right middle lobe. Hyperinflation consistent with COPD.  IMPRESSION: Right middle lobe pneumonia. Cannot exclude developing left perihilar pneumonia as well. Followup PA and lateral chest X-ray is recommended in 3-4 weeks following trial of antibiotic therapy to ensure resolution and exclude underlying malignancy.   Electronically Signed   By: Skipper Cliche M.D.   On: 01/24/2015 18:50    ____________________________________________   PROCEDURES  Procedure(s) performed: None  Critical Care performed: No ____________________________________________   INITIAL IMPRESSION / ASSESSMENT AND PLAN / ED COURSE  Pertinent labs & imaging results that were available during my care of the patient were reviewed by me and considered in my medical decision making (see chart for details).  79 year old female with known history of CAD, presenting with no acute complaints, but with abnormal vital signs including tachypnea, tachycardia. She does have abnormal findings on her EKG which may be representative of  ischemic changes. Her EKG is improving. Consultation with Dr. Towanda Malkin initiated, he will review the EKGs. The patient is not currently having any symptoms, she denies any chest pain, acute shortness of breath, palpitations, or lightheadedness. She continues to [facility] well. At this time I will initiate cardiac monitoring, initially labs, and start diuresis for what is likely fluid overload. Anticipate admission.  ----------------------------------------- 6:29 PM on 01/24/2015 -----------------------------------------  The patient is resting more comfortably now, she does continue to be slightly too But this has improved. Her troponin is negative, sodas possible that the EKG changes are due to respiratory difficulty. She does have an elevated white count of 23. I'm awaiting her portable chest x-ray, and assuming this shows infection, we'll initiate antibiotics. The patient will require admission. ____________________________________________  FINAL CLINICAL IMPRESSION(S) / ED DIAGNOSES  Final diagnoses:  Community acquired pneumonia  ST segment depression      NEW MEDICATIONS STARTED DURING THIS VISIT:  Current Discharge Medication List       Eula Listen, MD 01/24/15 2313

## 2015-01-24 NOTE — ED Notes (Signed)
MD at bedside. 

## 2015-01-24 NOTE — H&P (Signed)
[name] at [place] NAME: Melissa Cochran    MR#:  [id number]  DATE OF BIRTH:  Aug 26, 1935  DATE OF ADMISSION:  01/24/2015  PRIMARY CARE PHYSICIAN: Otilio Miu, MD   REQUESTING/REFERRING PHYSICIAN: Dr. Mariea Clonts  CHIEF COMPLAINT:   Chief Complaint  Patient presents with  . Shortness of Breath   shortness of breath, cough, malaise.  HISTORY OF PRESENT ILLNESS:  Melissa Cochran  is a 79 y.o. female with a known history of hypertension, hyperlipidemia, history of coronary disease status post stent, GERD, who presented to the hospital due to shortness of breath cough and malaise ongoing for the past week to 10 days. Patient was seen at urgent care at [facility] and recently diagnosed with a UTI and put on antibiotics for 10 days. She has finished her antibiotics but clinically does not feel any much better. She went to see her primary care physician who then referred her to the ER for further evaluation. In the emergency room patient had a chest x-ray which showed a right middle lobe pneumonia. She was also noted to have leukocytosis and also noted to have EKG changes in the lateral leads with some ST depressions. She does complain of intermittent chest pain which is more related to cough. She denies any diaphoresis, fevers, chills.  She does admit to a cough which is productive at times. She also admitted to admit to intermittent nausea and vomiting and poor by mouth intake for about a week. Given her above symptoms hospitalist services were contacted further treatment and evaluation.  PAST MEDICAL HISTORY:   Past Medical History  Diagnosis Date  . Hypertension   . Hyperlipemia   . Heart block     left  . Adnexal mass   . Bleeding ulcer     PAST SURGICAL HISTORY:   Past Surgical History  Procedure Laterality Date  . Cardiac surgery      cardiac cath  . Abdominal hysterectomy    . Stomach surgery    . Hip surgery      left     SOCIAL HISTORY:   Social History  Substance Use Topics  . Smoking status: Former Smoker -- 1.00 packs/day for 40 years    Quit date: 06/13/1998  . Smokeless tobacco: Not on file  . Alcohol Use: No    FAMILY HISTORY:   Family History  Problem Relation Age of Onset  . Stomach cancer Mother   . CVA Father     DRUG ALLERGIES:  No Known Allergies  REVIEW OF SYSTEMS:   Review of Systems  Constitutional: Negative for fever, chills and weight loss.  HENT: Negative for congestion, nosebleeds and tinnitus.   Eyes: Negative for blurred vision, double vision and redness.  Respiratory: Positive for cough, sputum production and shortness of breath. Negative for hemoptysis and wheezing.   Cardiovascular: Positive for chest pain (associated with cough). Negative for orthopnea, leg swelling and PND.  Gastrointestinal: Negative for nausea, vomiting, abdominal pain, diarrhea and melena.  Genitourinary: Negative for dysuria, urgency and hematuria.  Musculoskeletal: Negative for joint pain and falls.  Neurological: Positive for weakness (generalized). Negative for dizziness, tingling, sensory change, focal weakness, seizures and headaches.  Endo/Heme/Allergies: Negative for polydipsia. Does not bruise/bleed easily.  Psychiatric/Behavioral: Negative for depression and memory loss. The patient is not nervous/anxious.   All other systems reviewed and are negative.   MEDICATIONS AT HOME:   Prior to Admission medications   Medication Sig Start Date End  Date Taking? Authorizing Provider  acetaminophen (TYLENOL) 325 MG tablet Take 650 mg by mouth every 6 (six) hours as needed for mild pain or headache.    Yes Historical Provider, MD  amLODipine (NORVASC) 2.5 MG tablet Take 2.5 mg by mouth daily.   Yes Historical Provider, MD  clopidogrel (PLAVIX) 75 MG tablet Take 75 mg by mouth daily.   Yes Historical Provider, MD  metoprolol (LOPRESSOR) 100 MG tablet Take 100 mg by mouth 2 (two) times daily.    Yes Historical Provider, MD  furosemide (LASIX) 20 MG tablet Take 1 tablet (20 mg total) by mouth daily. As needed for leg swelling. Take potassium when taking furosemide. Patient not taking: Reported on 01/24/2015 12/09/14   [name], MD  gabapentin (NEURONTIN) 100 MG capsule Take 1 capsule (100 mg total) by mouth 3 (three) times daily. Patient not taking: Reported on 01/24/2015 12/09/14   [name], MD  nitrofurantoin, macrocrystal-monohydrate, (MACROBID) 100 MG capsule Take 1 capsule (100 mg total) by mouth 2 (two) times daily. Patient not taking: Reported on 01/24/2015 01/17/15   Olen Cordial, NP  potassium chloride (K-DUR) 10 MEQ tablet Take 1 tablet (10 mEq total) by mouth 2 (two) times a week. Take with doses of furosemide, to keep potassium from getting too low. Patient not taking: Reported on 01/24/2015 12/09/14   [name], MD      VITAL SIGNS:  Blood pressure 184/78, pulse 106, temperature 98.8 F (37.1 C), temperature source Oral, resp. rate 24, height 5\' 8"  (1.727 m), weight 68.493 kg (151 lb), SpO2 94 %.  PHYSICAL EXAMINATION:  Physical Exam  GENERAL:  79 y.o.-year-old patient lying in the bed in mild respiratory distress EYES: Pupils equal, round, reactive to light and accommodation. No scleral icterus. Extraocular muscles intact.  HEENT: Head atraumatic, normocephalic. Oropharynx and nasopharynx clear. No oropharyngeal erythema, moist oral mucosa  NECK:  Supple, no jugular venous distention. No thyroid enlargement, no tenderness.  LUNGS: Right-sided rhonchi. No wheezing, rales. Positive use of accessory muscles of respiration.  CARDIOVASCULAR: S1, S2 RRR. No murmurs, rubs, gallops, clicks.  ABDOMEN: Soft, nontender, nondistended. Bowel sounds present. No organomegaly or mass.  EXTREMITIES: No pedal edema, cyanosis, or clubbing. + 2 pedal & radial pulses b/l.   NEUROLOGIC: Cranial nerves II through XII are intact. No focal Motor or sensory deficits appreciated  b/l PSYCHIATRIC: The patient is alert and oriented x 3. Good affect.  SKIN: No obvious rash, lesion, or ulcer.   LABORATORY PANEL:   CBC  Recent Labs Lab 01/24/15 1718  WBC 23.1*  HGB 13.3  HCT 40.0  PLT 399   ------------------------------------------------------------------------------------------------------------------  Chemistries   Recent Labs Lab 01/24/15 1718  NA 136  K 3.4*  CL 97*  CO2 27  GLUCOSE 145*  BUN 27*  CREATININE 0.70  CALCIUM 9.9   ------------------------------------------------------------------------------------------------------------------  Cardiac Enzymes  Recent Labs Lab 01/24/15 1718  TROPONINI 0.03   ------------------------------------------------------------------------------------------------------------------  RADIOLOGY:  Dg Chest Portable 1 View  01/24/2015   CLINICAL DATA:  Increasingly severe shortness of breath today, history of COPD, CHF  EXAM: PORTABLE CHEST - 1 VIEW  COMPARISON:  09/09/2013  FINDINGS: The heart size is upper normal. Vascular pattern is normal. Left mildly prominent left perihilar markings. There is infiltrate in the right middle lobe. Hyperinflation consistent with COPD.  IMPRESSION: Right middle lobe pneumonia. Cannot exclude developing left perihilar pneumonia as well. Followup PA and lateral chest X-ray is recommended in 3-4 weeks following trial of antibiotic  therapy to ensure resolution and exclude underlying malignancy.   Electronically Signed   By: Skipper Cliche M.D.   On: 01/24/2015 18:50     IMPRESSION AND PLAN:   79 year old female with past medical history of hypertension, hyperlipidemia, history of chronic disease status post stent, history of tobacco abuse, neuropathy who presents to the hospital due to cough congestion shortness of breath and noted to have a pneumonia.  #1 pneumonia-likely the cause of patient's shortness of breath cough malaise. -Patient's chest x-ray finding is suggestive  of a right middle lobe pneumonia and possibly development of a left perihilar pneumonia. -I will treat the patient with IV Levaquin, follow sputum and blood cultures. Follow clinically.  #2 leukocytosis-likely secondary to the pneumonia. Follow white cell count after IV antibiotic therapy.  #3 chest pain with EKG changes-patient does have risk factors for underlying coronary artery disease given her history of hypertension, previous history of coronary artery disease.  She does have ST depressions in inferolateral leads which are but more pronounced than her previous EKG. -Although her chest pain is very atypical and associated more with the cough and respiratory symptoms. -I will observe him on telemetry, cycle cardiac markers. Get a cardiology consult, check a two-dimensional echocardiogram.  #4 hypertension-continue Norvasc, metoprolol.  #5 history of CAD-continue Plavix, metoprolol.     All the records are reviewed and case discussed with ED provider. Management plans discussed with the patient, family and they are in agreement.  CODE STATUS: Full  TOTAL TIME TAKING CARE OF THIS PATIENT: 45 minutes.    Henreitta Leber M.D on 01/24/2015 at 7:48 PM  Between 7am to 6pm - Pager - [phone]  After 6pm go to [email or web address] - password [email or web address] [facility]  [facility]  Office  [phone]  CC: Primary care physician; Otilio Miu, MD

## 2015-01-24 NOTE — ED Notes (Signed)
Attempted to call report and RN was busy in pts room. Will call back shortly.

## 2015-01-24 NOTE — ED Notes (Signed)
Pt to triage- was sent from pcp office for dyspnea on exertion. Pt has hx of chf.

## 2015-01-25 ENCOUNTER — Inpatient Hospital Stay
Admit: 2015-01-25 | Discharge: 2015-01-25 | Disposition: A | Discharge status: Home / Self Care | Payer: Medicare Other | Attending: Specialist | Admitting: Specialist

## 2015-01-25 LAB — CBC
HEMATOCRIT: 36.6 % (ref 35.0–47.0)
HEMOGLOBIN: 11.9 g/dL — AB (ref 12.0–16.0)
MCH: 30.2 pg (ref 26.0–34.0)
MCHC: 32.4 g/dL (ref 32.0–36.0)
MCV: 93.3 fL (ref 80.0–100.0)
Platelets: 319 10*3/uL (ref 150–440)
RBC: 3.92 MIL/uL (ref 3.80–5.20)
RDW: 12.8 % (ref 11.5–14.5)
WBC: 17.5 10*3/uL — AB (ref 3.6–11.0)

## 2015-01-25 LAB — BASIC METABOLIC PANEL
ANION GAP: 10 (ref 5–15)
BUN: 24 mg/dL — ABNORMAL HIGH (ref 6–20)
CO2: 28 mmol/L (ref 22–32)
Calcium: 8.9 mg/dL (ref 8.9–10.3)
Chloride: 101 mmol/L (ref 101–111)
Creatinine, Ser: 0.63 mg/dL (ref 0.44–1.00)
GFR calc Af Amer: >60 mL/min (ref >60)
GLUCOSE: 107 mg/dL — AB (ref 65–99)
POTASSIUM: 4.3 mmol/L (ref 3.5–5.1)
Sodium: 139 mmol/L (ref 135–145)

## 2015-01-25 LAB — TROPONIN I
TROPONIN I: 0.3 ng/mL — AB (ref <0.031)
Troponin I: 0.18 ng/mL — ABNORMAL HIGH (ref <0.031)

## 2015-01-25 NOTE — Progress Notes (Signed)
Spoke with Dr Volanda Napoleon regarding pt elevated troponin abnormal EKG chest pain and ST segment depression on admission, new order to transfer pt to 2A telemetry unit

## 2015-01-25 NOTE — Progress Notes (Signed)
[place] at [place] NAME: Melissa Cochran    MR#:  [id number]  DATE OF BIRTH:  August 30, 1935  SUBJECTIVE:  CHIEF COMPLAINT:   Chief Complaint  Patient presents with  . Shortness of Breath   Feeling much better this morning. No chest pain or respiratory distress.  REVIEW OF SYSTEMS:   Review of Systems  Constitutional: Negative for fever.  Respiratory: Positive for cough and wheezing. Negative for shortness of breath.   Cardiovascular: Negative for chest pain and palpitations.  Gastrointestinal: Negative for nausea, vomiting and abdominal pain.  Genitourinary: Negative for dysuria.    DRUG ALLERGIES:  No Known Allergies  VITALS:  Blood pressure 153/73, pulse 90, temperature 98.7 F (37.1 C), temperature source Oral, resp. rate 24, height 5\' 8"  (1.727 m), weight 68.493 kg (151 lb), SpO2 93 %.  PHYSICAL EXAMINATION:  GENERAL:  79 y.o.-year-old patient lying in the bed with no acute distress.  EYES: Pupils equal, round, reactive to light and accommodation. No scleral icterus. Extraocular muscles intact.  HEENT: Head atraumatic, normocephalic. Oropharynx and nasopharynx clear.  NECK:  Supple, no jugular venous distention. No thyroid enlargement, no tenderness.  LUNGS: Scattered rhonchi, rapid respirations, fair air movement, no wheezes CARDIOVASCULAR: S1, S2 normal. No murmurs, rubs, or gallops.  ABDOMEN: Soft, nontender, nondistended. Bowel sounds present. No organomegaly or mass.  EXTREMITIES: No pedal edema, cyanosis, or clubbing.  NEUROLOGIC: Cranial nerves II through XII are intact. Muscle strength 5/5 in all extremities. Sensation intact. Gait not checked.  PSYCHIATRIC: The patient is alert and oriented x 3.  SKIN: No obvious rash, lesion, or ulcer.    LABORATORY PANEL:   CBC  Recent Labs Lab 01/25/15 0136  WBC 17.5*  HGB 11.9*  HCT 36.6  PLT 319    ------------------------------------------------------------------------------------------------------------------  Chemistries   Recent Labs Lab 01/25/15 0136  NA 139  K 4.3  CL 101  CO2 28  GLUCOSE 107*  BUN 24*  CREATININE 0.63  CALCIUM 8.9   ------------------------------------------------------------------------------------------------------------------  Cardiac Enzymes  Recent Labs Lab 01/25/15 0545  TROPONINI 0.30*   ------------------------------------------------------------------------------------------------------------------  RADIOLOGY:  Dg Chest Portable 1 View  01/24/2015   CLINICAL DATA:  Increasingly severe shortness of breath today, history of COPD, CHF  EXAM: PORTABLE CHEST - 1 VIEW  COMPARISON:  09/09/2013  FINDINGS: The heart size is upper normal. Vascular pattern is normal. Left mildly prominent left perihilar markings. There is infiltrate in the right middle lobe. Hyperinflation consistent with COPD.  IMPRESSION: Right middle lobe pneumonia. Cannot exclude developing left perihilar pneumonia as well. Followup PA and lateral chest X-ray is recommended in 3-4 weeks following trial of antibiotic therapy to ensure resolution and exclude underlying malignancy.   Electronically Signed   By: Skipper Cliche M.D.   On: 01/24/2015 18:50    EKG:   Orders placed or performed during the hospital encounter of 01/24/15  . ED EKG  . ED EKG  . EKG 12-Lead  . EKG 12-Lead  . EKG 12-Lead  . EKG 12-Lead  . ED EKG  . ED EKG    ASSESSMENT AND PLAN:   Active Problems:   Pneumonia  #1 community-acquired pneumonia: - Admission chest x-ray showing right middle lobe pneumonia. Has also had shortness of breath, cough, sputum, leukocytosis - Blood cultures negative so far - Clinically much improved continue with Levaquin  #2 NSTEMI - Appreciate cardiology consultation, agree that this could possibly be due to strain with pneumonia - Echo pending - Into  need to  cycle cardiac enzymes, telemetry, blood pressure control, beta blocker, Plavix, monitor on 2A  #3: Hypertension: - Blood pressure controlled continue Norvasc and metoprolol  CODE STATUS: Full  TOTAL TIME TAKING CARE OF THIS PATIENT: 30 minutes.  Greater than 50% of time spent in care coordination and counseling. Care plan discussed with the patient, her daughter [name], nursing POSSIBLE D/C IN 1-2 DAYS, DEPENDING ON CLINICAL CONDITION.   Myrtis Ser M.D on 01/25/2015 at 11:53 AM  Between 7am to 6pm - Pager - [phone]  After 6pm go to [email or web address] - password [email or web address] [facility]  [facility]  Office  [phone]  CC: Primary care physician; Otilio Miu, MD

## 2015-01-25 NOTE — Consult Note (Signed)
Reason for Consult:SOB/Abn EKG Referring Physician: Dr Otilio Miu   Melissa Cochran is an 79 y.o. female.  HPI: Presents with shortness of breath weakness fatigue over the last few days so primary physician EKG was abnormal patient is referred to emergency room again EKG showed interventricular conduction delay chest x-ray suggested pneumonia patient subsequently advised to be admitted for further evaluation and monitoring because of elevated white count past history of smoking COPD.  Past Medical History  Diagnosis Date  . Hypertension   . Hyperlipemia   . Heart block     left  . Adnexal mass   . Bleeding ulcer     Past Surgical History  Procedure Laterality Date  . Cardiac surgery      cardiac cath  . Abdominal hysterectomy    . Stomach surgery    . Hip surgery      left    Family History  Problem Relation Age of Onset  . Stomach cancer Mother   . CVA Father     Social History:  reports that she quit smoking about 16 years ago. She does not have any smokeless tobacco history on file. She reports that she does not drink alcohol or use illicit drugs.  Allergies: No Known Allergies  Medications: I have reviewed the patient's current medications.  Results for orders placed or performed during the hospital encounter of 01/24/15 (from the past 48 hour(s))  CBC     Status: Abnormal   Collection Time: 01/24/15  5:18 PM  Result Value Ref Range   WBC 23.1 (H) 3.6 - 11.0 K/uL   RBC 4.32 3.80 - 5.20 MIL/uL   Hemoglobin 13.3 12.0 - 16.0 g/dL   HCT 40.0 35.0 - 47.0 %   MCV 92.6 80.0 - 100.0 fL   MCH 30.7 26.0 - 34.0 pg   MCHC 33.2 32.0 - 36.0 g/dL   RDW 12.9 11.5 - 14.5 %   Platelets 399 150 - 440 K/uL  Basic metabolic panel     Status: Abnormal   Collection Time: 01/24/15  5:18 PM  Result Value Ref Range   Sodium 136 135 - 145 mmol/L   Potassium 3.4 (L) 3.5 - 5.1 mmol/L   Chloride 97 (L) 101 - 111 mmol/L   CO2 27 22 - 32 mmol/L   Glucose, Bld 145 (H) 65 - 99  mg/dL   BUN 27 (H) 6 - 20 mg/dL   Creatinine, Ser 0.70 0.44 - 1.00 mg/dL   Calcium 9.9 8.9 - 10.3 mg/dL   GFR calc non Af Amer >60 >60 mL/min   GFR calc Af Amer >60 >60 mL/min    Comment: (NOTE) The eGFR has been calculated using the CKD EPI equation. This calculation has not been validated in all clinical situations. eGFR's persistently <60 mL/min signify possible Chronic Kidney Disease.    Anion gap 12 5 - 15  APTT     Status: None   Collection Time: 01/24/15  5:18 PM  Result Value Ref Range   aPTT 36 24 - 36 seconds  Protime-INR     Status: Abnormal   Collection Time: 01/24/15  5:18 PM  Result Value Ref Range   Prothrombin Time 15.3 (H) 11.4 - 15.0 seconds   INR 1.19   Troponin I     Status: None   Collection Time: 01/24/15  5:18 PM  Result Value Ref Range   Troponin I 0.03 <0.031 ng/mL    Comment:        NO  INDICATION OF MYOCARDIAL INJURY.   Blood culture (routine x 2)     Status: None (Preliminary result)   Collection Time: 01/24/15  6:15 PM  Result Value Ref Range   Specimen Description BLOOD LEFT ASSIST CONTROL    Special Requests      BOTTLES DRAWN AEROBIC AND ANAEROBIC  AER 6CC ANA 3CC   Culture NO GROWTH < 12 HOURS    Report Status PENDING   Blood gas, arterial ([facility] ONLY)     Status: Abnormal   Collection Time: 01/24/15  6:25 PM  Result Value Ref Range   FIO2 0.21    pH, Arterial 7.54 (H) 7.350 - 7.450   pCO2 arterial 32 32.0 - 48.0 mmHg   pO2, Arterial 62 (L) 83.0 - 108.0 mmHg   Bicarbonate 27.4 21.0 - 28.0 mEq/L   Acid-Base Excess 5.2 (H) 0.0 - 3.0 mmol/L   O2 Saturation 94.1 %   Patient temperature 37.0    Collection site RIGHT RADIAL    Sample type ARTERIAL DRAW    Allens test (pass/fail) POSITIVE (A) PASS  Type and screen     Status: None   Collection Time: 01/24/15  6:26 PM  Result Value Ref Range   ABO/RH(D) A NEG    Antibody Screen NEG    Sample Expiration 01/27/2015   Lactic acid, plasma     Status: None   Collection Time: 01/24/15   6:26 PM  Result Value Ref Range   Lactic Acid, Venous 1.8 0.5 - 2.0 mmol/L  Brain natriuretic peptide     Status: Abnormal   Collection Time: 01/24/15  6:26 PM  Result Value Ref Range   B Natriuretic Peptide 206.0 (H) 0.0 - 100.0 pg/mL  Blood culture (routine x 2)     Status: None (Preliminary result)   Collection Time: 01/24/15  6:26 PM  Result Value Ref Range   Specimen Description BLOOD LEFT ASSIST CONTROL    Special Requests BOTTLES DRAWN AEROBIC AND ANAEROBIC  5CC    Culture NO GROWTH < 12 HOURS    Report Status PENDING   ABO/Rh     Status: None   Collection Time: 01/24/15  6:27 PM  Result Value Ref Range   ABO/RH(D) A NEG   Urinalysis complete, with microscopic ([facility] only)     Status: Abnormal   Collection Time: 01/24/15  8:06 PM  Result Value Ref Range   Color, Urine AMBER (A) YELLOW   APPearance HAZY (A) CLEAR   Glucose, UA NEGATIVE NEGATIVE mg/dL   Bilirubin Urine NEGATIVE NEGATIVE   Ketones, ur TRACE (A) NEGATIVE mg/dL   Specific Gravity, Urine 1.025 1.005 - 1.030   Hgb urine dipstick 2+ (A) NEGATIVE   pH 6.0 5.0 - 8.0   Protein, ur 100 (A) NEGATIVE mg/dL   Nitrite NEGATIVE NEGATIVE   Leukocytes, UA 1+ (A) NEGATIVE   RBC / HPF TOO NUMEROUS TO COUNT 0 - 5 RBC/hpf   WBC, UA TOO NUMEROUS TO COUNT 0 - 5 WBC/hpf   Bacteria, UA NONE SEEN NONE SEEN   Squamous Epithelial / LPF 6-30 (A) NONE SEEN   Mucous PRESENT   Lactic acid, plasma     Status: None   Collection Time: 01/24/15  9:58 PM  Result Value Ref Range   Lactic Acid, Venous 2.0 0.5 - 2.0 mmol/L  Troponin I     Status: Abnormal   Collection Time: 01/24/15  9:58 PM  Result Value Ref Range   Troponin I 0.04 (H) <0.031  ng/mL    Comment: READ BACK AND VERIFIED BY CHRISTINA WELCH AT 2258 ON 01/24/15 [facility]        PERSISTENTLY INCREASED TROPONIN VALUES IN THE RANGE OF 0.04-0.49 ng/mL CAN BE SEEN IN:       -UNSTABLE ANGINA       -CONGESTIVE HEART FAILURE       -MYOCARDITIS       -CHEST TRAUMA       -ARRYHTHMIAS        -LATE PRESENTING MYOCARDIAL INFARCTION       -COPD   CLINICAL FOLLOW-UP RECOMMENDED.   Basic metabolic panel     Status: Abnormal   Collection Time: 01/25/15  1:36 AM  Result Value Ref Range   Sodium 139 135 - 145 mmol/L   Potassium 4.3 3.5 - 5.1 mmol/L   Chloride 101 101 - 111 mmol/L   CO2 28 22 - 32 mmol/L   Glucose, Bld 107 (H) 65 - 99 mg/dL   BUN 24 (H) 6 - 20 mg/dL   Creatinine, Ser 0.63 0.44 - 1.00 mg/dL   Calcium 8.9 8.9 - 10.3 mg/dL   GFR calc non Af Amer >60 >60 mL/min   GFR calc Af Amer >60 >60 mL/min    Comment: (NOTE) The eGFR has been calculated using the CKD EPI equation. This calculation has not been validated in all clinical situations. eGFR's persistently <60 mL/min signify possible Chronic Kidney Disease.    Anion gap 10 5 - 15  CBC     Status: Abnormal   Collection Time: 01/25/15  1:36 AM  Result Value Ref Range   WBC 17.5 (H) 3.6 - 11.0 K/uL   RBC 3.92 3.80 - 5.20 MIL/uL   Hemoglobin 11.9 (L) 12.0 - 16.0 g/dL   HCT 36.6 35.0 - 47.0 %   MCV 93.3 80.0 - 100.0 fL   MCH 30.2 26.0 - 34.0 pg   MCHC 32.4 32.0 - 36.0 g/dL   RDW 12.8 11.5 - 14.5 %   Platelets 319 150 - 440 K/uL  Troponin I     Status: Abnormal   Collection Time: 01/25/15  1:36 AM  Result Value Ref Range   Troponin I 0.18 (H) <0.031 ng/mL    Comment: RESULTS PREVIOUSLY CALLED BY RW AT 2258 ON 01/24/15 [facility]        PERSISTENTLY INCREASED TROPONIN VALUES IN THE RANGE OF 0.04-0.49 ng/mL CAN BE SEEN IN:       -UNSTABLE ANGINA       -CONGESTIVE HEART FAILURE       -MYOCARDITIS       -CHEST TRAUMA       -ARRYHTHMIAS       -LATE PRESENTING MYOCARDIAL INFARCTION       -COPD   CLINICAL FOLLOW-UP RECOMMENDED.   Troponin I     Status: Abnormal   Collection Time: 01/25/15  5:45 AM  Result Value Ref Range   Troponin I 0.30 (H) <0.031 ng/mL    Comment: READ BACK AND VERIFIED CHRISTINA WELCH AT 0630 ON 01/25/15 RWW        PERSISTENTLY INCREASED TROPONIN VALUES IN THE RANGE OF 0.04-0.49 ng/mL  CAN BE SEEN IN:       -UNSTABLE ANGINA       -CONGESTIVE HEART FAILURE       -MYOCARDITIS       -CHEST TRAUMA       -ARRYHTHMIAS       -LATE PRESENTING MYOCARDIAL INFARCTION       -COPD  CLINICAL FOLLOW-UP RECOMMENDED.     Dg Chest Portable 1 View  01/24/2015   CLINICAL DATA:  Increasingly severe shortness of breath today, history of COPD, CHF  EXAM: PORTABLE CHEST - 1 VIEW  COMPARISON:  09/09/2013  FINDINGS: The heart size is upper normal. Vascular pattern is normal. Left mildly prominent left perihilar markings. There is infiltrate in the right middle lobe. Hyperinflation consistent with COPD.  IMPRESSION: Right middle lobe pneumonia. Cannot exclude developing left perihilar pneumonia as well. Followup PA and lateral chest X-ray is recommended in 3-4 weeks following trial of antibiotic therapy to ensure resolution and exclude underlying malignancy.   Electronically Signed   By: Skipper Cliche M.D.   On: 01/24/2015 18:50    Review of Systems  Constitutional: Positive for fever and malaise/fatigue.  HENT: Positive for congestion.   Eyes: Negative.   Respiratory: Positive for shortness of breath.   Cardiovascular: Negative.   Gastrointestinal: Negative.   Genitourinary: Negative.   Musculoskeletal: Negative.   Skin: Negative.   Neurological: Positive for weakness.  Endo/Heme/Allergies: Negative.   Psychiatric/Behavioral: Negative.    Blood pressure 153/73, pulse 90, temperature 98.7 F (37.1 C), temperature source Oral, resp. rate 24, height 5' 8" (1.727 m), weight 68.493 kg (151 lb), SpO2 93 %. Physical Exam  Constitutional: She is oriented to person, place, and time. She appears well-developed and well-nourished.  HENT:  Head: Normocephalic and atraumatic.  Right Ear: External ear normal.  Eyes: Conjunctivae and EOM are normal. Pupils are equal, round, and reactive to light.  Neck: Normal range of motion. Neck supple.  Cardiovascular: Normal rate, regular rhythm and normal  heart sounds.   Respiratory: Effort normal and breath sounds normal.  GI: Soft. Bowel sounds are normal.  Musculoskeletal: Normal range of motion.  Neurological: She is alert and oriented to person, place, and time. She has normal reflexes.  Skin: Skin is warm.  Psychiatric: She has a normal mood and affect. Her behavior is normal.    Assessment/Plan: SOB HTN Pneumonia Neuropathy CAD Chronic venous insuff Smoking quit WBC elevation Congestion Demand ischemia . PLAN Agree with IV antibx for pneumonia Suppliment 02 for hypoxemia Inhalers as necessary for shortness of breath Hypertension control with amlodipine and metoprolol Short-term anticoagulation for DVT prophylaxis Borderline elevation of troponin Agree with echocardiogram Continue aspirin and Plavix therapy Continue inpatient therapy No indication for cardiac catheterization at this point      CALLWOOD,DWAYNE D. 01/25/2015, 11:03 AM

## 2015-01-25 NOTE — Progress Notes (Signed)
*  PRELIMINARY RESULTS* Echocardiogram 2D Echocardiogram has been performed.  Melissa Cochran 01/25/2015, 10:32 AM

## 2015-01-25 NOTE — Plan of Care (Signed)
Problem: Discharge Progression Outcomes Goal: Other Discharge Outcomes/Goals Plan of care progress to goal: Pain-denies pain VSS Diet: poor appetite Activity: one assist up to bsc

## 2015-01-25 NOTE — Progress Notes (Signed)
Patient transferred to 2A Room 236. Patient denies pain and all questions answered. Patient oriented to unit and Fall Safety Plan signed. Skin assessment completed with Elna Breslow RN. Nursing staff will continue to monitor. Earleen Reaper, RN

## 2015-01-25 NOTE — Progress Notes (Signed)
Spoke with Dr. Volanda Napoleon regarding patient's loose stools recorded before unit transfer. Per MD patient does not need to be on enteric precautions as she has not had any more loose stools since being admitted to the unit. Nursing staff will continue to monitor. Earleen Reaper, RN

## 2015-01-25 NOTE — Progress Notes (Addendum)
Report called to 2A nurse receiving pt, orderly to transport pt, pt with no noted complaints, daughter at bedside

## 2015-01-25 NOTE — Progress Notes (Signed)
Alert and oriented, forgetful at times, pt with no noted complaints this morning, up x1 assist to [facility], daughter at bedside, pt being transfer to telemetry unit, pt voices any needs

## 2015-01-26 LAB — BASIC METABOLIC PANEL
ANION GAP: 6 (ref 5–15)
BUN: 15 mg/dL (ref 6–20)
CALCIUM: 8.7 mg/dL — AB (ref 8.9–10.3)
CO2: 27 mmol/L (ref 22–32)
CREATININE: 0.43 mg/dL — AB (ref 0.44–1.00)
Chloride: 106 mmol/L (ref 101–111)
Glucose, Bld: 102 mg/dL — ABNORMAL HIGH (ref 65–99)
Potassium: 3.9 mmol/L (ref 3.5–5.1)
Sodium: 139 mmol/L (ref 135–145)

## 2015-01-26 LAB — CBC
HEMATOCRIT: 33.7 % — AB (ref 35.0–47.0)
Hemoglobin: 11.2 g/dL — ABNORMAL LOW (ref 12.0–16.0)
MCH: 31 pg (ref 26.0–34.0)
MCHC: 33.2 g/dL (ref 32.0–36.0)
MCV: 93.6 fL (ref 80.0–100.0)
PLATELETS: 274 10*3/uL (ref 150–440)
RBC: 3.61 MIL/uL — ABNORMAL LOW (ref 3.80–5.20)
RDW: 12.6 % (ref 11.5–14.5)
WBC: 13.3 10*3/uL — AB (ref 3.6–11.0)

## 2015-01-26 LAB — URINE CULTURE: Special Requests: NORMAL

## 2015-01-26 LAB — TROPONIN I: Troponin I: 0.15 ng/mL — ABNORMAL HIGH (ref <0.031)

## 2015-01-26 MED ORDER — AMLODIPINE BESYLATE 5 MG PO TABS
5.0000 mg | ORAL_TABLET | Freq: Every day | ORAL | Status: DC
  Administered 2015-01-27 – 2015-01-28 (×2): 5 mg via ORAL
  Filled 2015-01-26 (×2): qty 1

## 2015-01-26 MED ORDER — HYDRALAZINE HCL 20 MG/ML IJ SOLN: 10.0000 mg | Freq: Four times a day (QID) | INTRAMUSCULAR | Status: DC | PRN

## 2015-01-26 NOTE — Progress Notes (Signed)
BP 180/78 will recheck in hour and see how BP looks

## 2015-01-26 NOTE — Progress Notes (Signed)
Dr. Volanda Napoleon made aware of pt's elevated BP, new orders received.

## 2015-01-26 NOTE — Progress Notes (Signed)
[place] at [place] NAME: Melissa Cochran    MR#:  [id number]  DATE OF BIRTH:  Jul 01, 1935  SUBJECTIVE:  CHIEF COMPLAINT:   Chief Complaint  Patient presents with  . Shortness of Breath   Feeling much better this morning. No chest pain or respiratory distress. Feels very weak, like she can't walk.  REVIEW OF SYSTEMS:   Review of Systems  Constitutional: Negative for fever.  Respiratory: Positive for cough and wheezing. Negative for shortness of breath.   Cardiovascular: Negative for chest pain and palpitations.  Gastrointestinal: Negative for nausea, vomiting and abdominal pain.  Genitourinary: Negative for dysuria.    DRUG ALLERGIES:  No Known Allergies  VITALS:  Blood pressure 188/87, pulse 54, temperature 98 F (36.7 C), temperature source Oral, resp. rate 17, height 5\' 8"  (1.727 m), weight 68.493 kg (151 lb), SpO2 100 %.  PHYSICAL EXAMINATION:  GENERAL:  79 y.o.-year-old patient lying in the bed with no acute distress.  EYES: Pupils equal, round, reactive to light and accommodation. No scleral icterus. Extraocular muscles intact.  HEENT: Head atraumatic, normocephalic. Oropharynx and nasopharynx clear.  NECK:  Supple, no jugular venous distention. No thyroid enlargement, no tenderness.  LUNGS: Scattered rhonchi, rapid respirations, fair air movement, no wheezes CARDIOVASCULAR: S1, S2 normal. No murmurs, rubs, or gallops.  ABDOMEN: Soft, nontender, nondistended. Bowel sounds present. No organomegaly or mass.  EXTREMITIES: No pedal edema, cyanosis, or clubbing.  NEUROLOGIC: Cranial nerves II through XII are intact. Muscle strength 5/5 in all extremities. Sensation intact. Gait not checked.  PSYCHIATRIC: The patient is alert and oriented x 3.  SKIN: No obvious rash, lesion, or ulcer.    LABORATORY PANEL:   CBC  Recent Labs Lab 01/26/15 0435  WBC 13.3*  HGB 11.2*  HCT 33.7*  PLT 274    ------------------------------------------------------------------------------------------------------------------  Chemistries   Recent Labs Lab 01/26/15 0435  NA 139  K 3.9  CL 106  CO2 27  GLUCOSE 102*  BUN 15  CREATININE 0.43*  CALCIUM 8.7*   ------------------------------------------------------------------------------------------------------------------  Cardiac Enzymes  Recent Labs Lab 01/25/15 0545  TROPONINI 0.30*   ------------------------------------------------------------------------------------------------------------------  RADIOLOGY:  Dg Chest Portable 1 View  01/24/2015   CLINICAL DATA:  Increasingly severe shortness of breath today, history of COPD, CHF  EXAM: PORTABLE CHEST - 1 VIEW  COMPARISON:  09/09/2013  FINDINGS: The heart size is upper normal. Vascular pattern is normal. Left mildly prominent left perihilar markings. There is infiltrate in the right middle lobe. Hyperinflation consistent with COPD.  IMPRESSION: Right middle lobe pneumonia. Cannot exclude developing left perihilar pneumonia as well. Followup PA and lateral chest X-ray is recommended in 3-4 weeks following trial of antibiotic therapy to ensure resolution and exclude underlying malignancy.   Electronically Signed   By: Skipper Cliche M.D.   On: 01/24/2015 18:50    EKG:   Orders placed or performed during the hospital encounter of 01/24/15  . ED EKG  . ED EKG  . EKG 12-Lead  . EKG 12-Lead  . EKG 12-Lead  . EKG 12-Lead  . ED EKG  . ED EKG    ASSESSMENT AND PLAN:   Active Problems:   Pneumonia  #1 community-acquired pneumonia: - Admission chest x-ray showing right middle lobe pneumonia. Has also had shortness of breath, cough, sputum, leukocytosis - Blood cultures NTD - Clinically much improved continue with Levaquin  #2 NSTEMI - Appreciate cardiology consultation, agree that this could possibly be due to strain with pneumonia -  Echo performed, read is pending -  continue telemetry, blood pressure control, beta blocker, Plavix, monitor on 2A - check troponin x 1 today to be sure is decreasing  #3: Hypertension: - Blood pressure elevated increase Norvasc and continue metoprolol  #4 deconditioning - feels weak and off balance. PT eval pending  CODE STATUS: Full  TOTAL TIME TAKING CARE OF THIS PATIENT: 30 minutes.  Greater than 50% of time spent in care coordination and counseling. Care plan discussed with the patient, her daughter [name], nursing POSSIBLE D/C IN 1-2 DAYS, DEPENDING ON CLINICAL CONDITION.   Myrtis Ser M.D on 01/26/2015 at 12:02 PM  Between 7am to 6pm - Pager - [phone]  After 6pm go to [email or web address] - password [email or web address] [facility]  [place] [facility]  Office  [phone]  CC: Primary care physician; Otilio Miu, MD

## 2015-01-27 ENCOUNTER — Inpatient Hospital Stay: Payer: Medicare Other

## 2015-01-27 LAB — CBC
HEMATOCRIT: 36.5 % (ref 35.0–47.0)
Hemoglobin: 12.1 g/dL (ref 12.0–16.0)
MCH: 31.2 pg (ref 26.0–34.0)
MCHC: 33.1 g/dL (ref 32.0–36.0)
MCV: 94.2 fL (ref 80.0–100.0)
Platelets: 303 10*3/uL (ref 150–440)
RBC: 3.87 MIL/uL (ref 3.80–5.20)
RDW: 12.9 % (ref 11.5–14.5)
WBC: 9.8 10*3/uL (ref 3.6–11.0)

## 2015-01-27 MED ORDER — IPRATROPIUM-ALBUTEROL 0.5-2.5 (3) MG/3ML IN SOLN
3.0000 mL | Freq: Four times a day (QID) | RESPIRATORY_TRACT | Status: DC
  Administered 2015-01-27 – 2015-01-28 (×4): 3 mL via RESPIRATORY_TRACT
  Filled 2015-01-27 (×4): qty 3

## 2015-01-27 MED ORDER — LEVOFLOXACIN 750 MG PO TABS: 750.0000 mg | ORAL_TABLET | Freq: Every day | ORAL | Status: DC

## 2015-01-27 MED ORDER — PREDNISONE 50 MG PO TABS
50.0000 mg | ORAL_TABLET | Freq: Every day | ORAL | Status: DC
  Administered 2015-01-28: 50 mg via ORAL
  Filled 2015-01-27: qty 1

## 2015-01-27 MED ORDER — AMLODIPINE BESYLATE 5 MG PO TABS: 5.0000 mg | ORAL_TABLET | Freq: Every day | ORAL | Status: DC

## 2015-01-27 MED ORDER — LEVOFLOXACIN 750 MG PO TABS
750.0000 mg | ORAL_TABLET | ORAL | Status: DC
  Administered 2015-01-27: 750 mg via ORAL
  Filled 2015-01-27: qty 1

## 2015-01-27 NOTE — Discharge Instructions (Signed)
°  DIET:  °Regular diet ° °DISCHARGE CONDITION:  °Stable ° °ACTIVITY:  °Activity as tolerated ° °OXYGEN:  °Home Oxygen: No. °  °Oxygen Delivery: room air ° °DISCHARGE LOCATION:  °home  ° °If you experience worsening of your admission symptoms, develop shortness of breath, life threatening emergency, suicidal or homicidal thoughts you must seek medical attention immediately by calling 911 or calling your MD immediately  if symptoms less severe. ° °You Must read complete instructions/literature along with all the possible adverse reactions/side effects for all the Medicines you take and that have been prescribed to you. Take any new Medicines after you have completely understood and accpet all the possible adverse reactions/side effects.  ° °Please note ° °You were cared for by a hospitalist during your hospital stay. If you have any questions about your discharge medications or the care you received while you were in the hospital after you are discharged, you can call the unit and asked to speak with the hospitalist on call if the hospitalist that took care of you is not available. Once you are discharged, your primary care physician will handle any further medical issues. Please note that NO REFILLS for any discharge medications will be authorized once you are discharged, as it is imperative that you return to your primary care physician (or establish a relationship with a primary care physician if you do not have one) for your aftercare needs so that they can reassess your need for medications and monitor your lab values. ° ° °

## 2015-01-27 NOTE — Care Management Important Message (Signed)
Important Message  Patient Details  Name: Melissa Cochran MRN: [id number] Date of Birth: 1935/11/29   Medicare Important Message Given:  Yes-second notification given    Juliann Pulse A Allmond 01/27/2015, 2:06 PM

## 2015-01-27 NOTE — Progress Notes (Signed)
To Xray via bed 

## 2015-01-27 NOTE — Progress Notes (Signed)
[place] at [place] NAME: Melissa Cochran    MR#:  [id number]  DATE OF BIRTH:  1936-02-13  SUBJECTIVE:  CHIEF COMPLAINT:   Chief Complaint  Patient presents with  . Shortness of Breath   States that she feels very weak, but refuses to work with physical therapy. Becomes short of breath when discussing discharge, oxygenation is normal.  REVIEW OF SYSTEMS:   Review of Systems  Constitutional: Negative for fever.  Respiratory: Positive for cough and wheezing. Negative for shortness of breath.   Cardiovascular: Negative for chest pain and palpitations.  Gastrointestinal: Negative for nausea, vomiting and abdominal pain.  Genitourinary: Negative for dysuria.    DRUG ALLERGIES:  No Known Allergies  VITALS:  Blood pressure 147/66, pulse 71, temperature 98.1 F (36.7 C), temperature source Oral, resp. rate 18, height 5\' 8"  (1.727 m), weight 68.493 kg (151 lb), SpO2 96 %.  PHYSICAL EXAMINATION:  GENERAL:  79 y.o.-year-old patient lying in the bed with no acute distress.  EYES: Pupils equal, round, reactive to light and accommodation. No scleral icterus. Extraocular muscles intact.  HEENT: Head atraumatic, normocephalic. Oropharynx and nasopharynx clear.  NECK:  Supple, no jugular venous distention. No thyroid enlargement, no tenderness.  LUNGS: Scattered rhonchi, rapid respirations, fair air movement, no wheezes CARDIOVASCULAR: S1, S2 normal. No murmurs, rubs, or gallops.  ABDOMEN: Soft, nontender, nondistended. Bowel sounds present. No organomegaly or mass.  EXTREMITIES: No pedal edema, cyanosis, or clubbing.  NEUROLOGIC: Cranial nerves II through XII are intact. Muscle strength 5/5 in all extremities. Sensation intact. Gait not checked.  PSYCHIATRIC: The patient is alert and oriented x 3.  SKIN: No obvious rash, lesion, or ulcer.    LABORATORY PANEL:   CBC  Recent Labs Lab 01/27/15 0356  WBC 9.8  HGB 12.1  HCT 36.5   PLT 303   ------------------------------------------------------------------------------------------------------------------  Chemistries   Recent Labs Lab 01/26/15 0435  NA 139  K 3.9  CL 106  CO2 27  GLUCOSE 102*  BUN 15  CREATININE 0.43*  CALCIUM 8.7*   ------------------------------------------------------------------------------------------------------------------  Cardiac Enzymes  Recent Labs Lab 01/26/15 1240  TROPONINI 0.15*   ------------------------------------------------------------------------------------------------------------------  RADIOLOGY:  Dg Chest 2 View  01/27/2015   CLINICAL DATA:  Follow-up pneumonia.  Former smoker.  EXAM: CHEST  2 VIEW  COMPARISON:  Chest x-ray dated 01/24/2015.  FINDINGS: There is improved aeration at the right lung base compatible with resolving pneumonia. No new lung findings. Lungs remain at least mildly hyperexpanded suggesting COPD.  Cardiomediastinal silhouette is stable in size and configuration. Atherosclerotic changes again noted along the walls of the grossly normal-caliber thoracic aorta. No pleural effusion. No pneumothorax. No acute osseous abnormality.  IMPRESSION: Improved aeration at the right lung base compatible with resolving pneumonia.  Probable COPD.   Electronically Signed   By: Franki Cabot M.D.   On: 01/27/2015 10:56    EKG:   Orders placed or performed during the hospital encounter of 01/24/15  . ED EKG  . ED EKG  . EKG 12-Lead  . EKG 12-Lead  . EKG 12-Lead  . EKG 12-Lead  . ED EKG  . ED EKG    ASSESSMENT AND PLAN:   Active Problems:   Pneumonia  #1 community-acquired pneumonia: - Admission chest x-ray showing right middle lobe pneumonia, repeat x-ray shows improvement. - Blood cultures NTD - Clinically much improved continue with Levaquin to complete 7 day course  #2 NSTEMI - Appreciate cardiology consultation, agree that this  could possibly be due to strain with pneumonia - Echo is  normal - continue telemetry, blood pressure control, beta blocker, Plavix, monitor on 2A - troponin is trending downwards  #3: Hypertension: - Blood pressure elevated increase Norvasc and continue metoprolol  #4 deconditioning - feels weak and off balance. PT eval recommendation is for skilled nursing. Patient refuses skilled nursing or home health. But also states that she is not safe on her feet and if discharged would come right back. Care management working with family to develop discharge plan  #5 likely COPD: Does have smoking history and changes on x-ray suggestive of COPD. I have added prednisone and nebulizer treatments to assist with breathing. Continue Levaquin.  CODE STATUS: Full  TOTAL TIME TAKING CARE OF THIS PATIENT: 30 minutes.  Greater than 50% of time spent in care coordination and counseling. Care plan discussed with the patient, her daughter [name], nursing, care management POSSIBLE D/C IN 1-2 DAYS, DEPENDING ON CLINICAL CONDITION.   Myrtis Ser M.D on 01/27/2015 at 2:35 PM  Between 7am to 6pm - Pager - [phone]  After 6pm go to [email or web address] - password [email or web address] [facility]  [place] [facility]  Office  [phone]  CC: Primary care physician; Otilio Miu, MD

## 2015-01-27 NOTE — Progress Notes (Signed)
CONCERNING: Antibiotic IV to Oral Route Change Policy  RECOMMENDATION: This patient is receiving levofloxacin by the intravenous route.  Based on criteria approved by the Pharmacy and Therapeutics Committee, the antibiotic(s) is/are being converted to the equivalent oral dose form(s).   DESCRIPTION: These criteria include:  Patient being treated for a respiratory tract infection, urinary tract infection, cellulitis or clostridium difficile associated diarrhea if on metronidazole  The patient is not neutropenic and does not exhibit a GI malabsorption state  The patient is eating (either orally or via tube) and/or has been taking other orally administered medications for a least 24 hours  The patient is improving clinically and has a Tmax < 100.5  If you have questions about this conversion, please contact the [place], PharmD Clinical Pharmacist  01/27/2015 9:23 AM

## 2015-01-27 NOTE — Progress Notes (Signed)
Back from xray

## 2015-01-27 NOTE — Evaluation (Signed)
Physical Therapy Evaluation Patient Details Name: Melissa Cochran MRN: [id number] DOB: 1935-06-06 Today's Date: 01/27/2015   History of Present Illness  Pt is a 79 yo female who was admitted to the hospital for SOB. States she has had chronic cough and chest pain.   Clinical Impression  Pt presents with hx of HTN, heart block, and hyperlipidemia. Examination reveals that pt performs all mobility at min assist and that pt has generalized weakness coupled with decreased activity tolerance (reference ambulation section for O2 sats). Pt is previously indep with mobility and a community ambulator. She states that she is still employed. Due to her deficits, pt will benefit from skilled PT in order for her to return to optimal PLOF and return home safely. Pt is pleasant to work with and motivated to participate in therapy tasks.     Follow Up Recommendations SNF    Equipment Recommendations  None recommended by PT    Recommendations for Other Services       Precautions / Restrictions Restrictions Weight Bearing Restrictions: No      Mobility  Bed Mobility Overal bed mobility: Needs Assistance Bed Mobility: Supine to Sit     Supine to sit: Min assist     General bed mobility comments: Pt requires minor assist to get to EOB. Needs cues for reaching and assist for LE management   Transfers Overall transfer level: Needs assistance Equipment used: Rolling walker (2 wheeled) Transfers: Sit to/from Stand Sit to Stand: Min assist         General transfer comment: Requires assist to get into standing, although she shows fair functional strength once her feet are placed appropriately. Needs cues for hand placement. Pt oxygen saturation at rest on room air was 95% and after ambulation on room air was 96%.   Ambulation/Gait   Ambulation Distance (Feet): 30 Feet Assistive device: Rolling walker (2 wheeled) Gait Pattern/deviations: Shuffle;Decreased step length - right;Decreased  step length - left;Drifts right/left Gait velocity: decreased Gait velocity interpretation: <1.8 ft/sec, indicative of risk for recurrent falls General Gait Details: Pt requires assist for management of RW. Needs cues for sequencing RW with gait. Pt tends to drift left and right, although it is fairly minor. No LOB noted  Stairs            Wheelchair Mobility    Modified Rankin (Stroke Patients Only)       Balance Overall balance assessment: No apparent balance deficits (not formally assessed) (Claims zero falls in past year)                                           Pertinent Vitals/Pain Pain Assessment: No/denies pain    Home Living Family/patient expects to be discharged to:: Private residence Living Arrangements: Children (daughter) Available Help at Discharge: Available PRN/intermittently (daughter) Type of Home: House Home Access: Level entry     Home Layout: One level Home Equipment: Cane - single point;Walker - 2 wheels      Prior Function Level of Independence: Independent (Does not use assistive device)         Comments: Pt was independent with ADLs and mobility. 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       Assessment/Plan    PT Assessment Patient needs continued PT services  PT  Diagnosis Abnormality of gait;Generalized weakness   PT Problem List Decreased activity tolerance;Decreased strength;Cardiopulmonary status limiting activity  PT Treatment Interventions DME instruction;Gait training;Stair training;Functional mobility training;Therapeutic activities;Balance training;Therapeutic exercise;Neuromuscular re-education;Patient/family education;Wheelchair mobility training;Manual techniques   PT Goals (Current goals can be found in the Care Plan section) Acute Rehab PT Goals Patient Stated Goal: To return home  PT Goal Formulation: With patient Time For Goal Achievement: 02/10/15 Potential to Achieve Goals: Good    Frequency Min 2X/week   Barriers to discharge        Co-evaluation               End of Session Equipment Utilized During Treatment: Gait belt Activity Tolerance: Patient tolerated treatment well Patient left: in bed;with call bell/phone within reach;with chair alarm set (Pt wished to be bathed; left in bed) Nurse Communication: Mobility status (bathing request)         Time: 4503-8882 PT Time Calculation (min) (ACUTE ONLY): 22 min   Charges:         PT G CodesJanyth Contes 2015/02/26, 11:04 AM

## 2015-01-27 NOTE — Care Management (Signed)
Patient presents from home with shortness of breath.  Admitted to [facility] on bipap then transferred to 2A.  Patient is satting 95 % on room air on exertion.  She says her daughter lives with her and patient works part time at [employer].  Says she is independent in all her adls.    Says she is too weak right now to discuss discharge plans.  Says nobody will let her rest.  Physical therapy has evaluated and recommends skilled nursing.  Patient adamantly declines this discharge option.  She also adamantly declines home health services.  She say that her daughter will refuse too because "we have help."  Patient has refused discharge to the attending.   Patient gave CM permission to speak with her daughter [name].  Left message [phone].  Patient confirms her PCP, address and phone numbers.  She denies problems accessing medical care or meds.

## 2015-01-28 DIAGNOSIS — J189 Pneumonia, unspecified organism: Secondary | ICD-10-CM | POA: Diagnosis not present

## 2015-01-28 DIAGNOSIS — I2510 Atherosclerotic heart disease of native coronary artery without angina pectoris: Secondary | ICD-10-CM | POA: Diagnosis not present

## 2015-01-28 DIAGNOSIS — I10 Essential (primary) hypertension: Secondary | ICD-10-CM | POA: Diagnosis not present

## 2015-01-28 DIAGNOSIS — R079 Chest pain, unspecified: Secondary | ICD-10-CM | POA: Diagnosis not present

## 2015-01-28 DIAGNOSIS — R9431 Abnormal electrocardiogram [ECG] [EKG]: Secondary | ICD-10-CM | POA: Diagnosis not present

## 2015-01-28 DIAGNOSIS — M6281 Muscle weakness (generalized): Secondary | ICD-10-CM | POA: Diagnosis not present

## 2015-01-28 DIAGNOSIS — J9601 Acute respiratory failure with hypoxia: Secondary | ICD-10-CM | POA: Diagnosis not present

## 2015-01-28 DIAGNOSIS — D72829 Elevated white blood cell count, unspecified: Secondary | ICD-10-CM | POA: Diagnosis not present

## 2015-01-28 DIAGNOSIS — R262 Difficulty in walking, not elsewhere classified: Secondary | ICD-10-CM | POA: Diagnosis not present

## 2015-01-28 DIAGNOSIS — J181 Lobar pneumonia, unspecified organism: Secondary | ICD-10-CM | POA: Diagnosis not present

## 2015-01-28 MED ORDER — LEVOFLOXACIN 750 MG PO TABS: 750.0000 mg | ORAL_TABLET | ORAL | Status: DC

## 2015-01-28 MED ORDER — ALBUTEROL SULFATE HFA 108 (90 BASE) MCG/ACT IN AERS: 2.0000 | INHALATION_SPRAY | Freq: Four times a day (QID) | RESPIRATORY_TRACT | Status: DC | PRN

## 2015-01-28 MED ORDER — PREDNISONE 50 MG PO TABS: ORAL_TABLET | ORAL | Status: DC

## 2015-01-28 NOTE — Clinical Social Work Note (Signed)
Clinical Social Work Assessment  Patient Details  Name: Melissa Cochran MRN: [id number] Date of Birth: 03/22/1936  Date of referral:  01/28/15               Reason for consult:  Facility Placement                Permission sought to share information with:  Facility Sport and exercise psychologist, Family Supports Permission granted to share information::  Yes, Verbal Permission Granted  Name::     Hillary granddaughter [place]::  [facility]  Relationship::     Contact Information:     Housing/Transportation Living arrangements for the past 2 months:  Single Family Home Source of Information:  Patient, Other (Comment Required) (Granddaughter) Patient Interpreter Needed:  None Criminal Activity/Legal Involvement Pertinent to Current Situation/Hospitalization:  No - Comment as needed Significant Relationships:  Adult Children, Other(Comment) (adult grandchildren) Lives with:  Adult Children Do you feel safe going back to the place where you live?  Yes Need for family participation in patient care:  Yes (Comment)  Care giving concerns:  Current concern is that pt will not be able to complete all her ADL once home.  Pt has now agreed to SNF placement before going home.   Social Worker assessment / plan:  CSW was able to speak to pt again.  Yesterday pt was adamant about returning home, but is now in agreement with DC to SNF.  Per pt she is still working at a [employer] part time.  SHe has had this job for 20 years.  Pt's granddaughter confirms this.  Pt lives with her daughter and their 3 pets (2 dogs and one cat).  Pt has been to [facility] before in 2014 for a broken hip and would like to return to this facility if possible.  CSW will reach out to [facility] for bed offer.  Employment status:  Part-Time Forensic scientist:  Medicare PT Recommendations:  Echo / Referral to community resources:  [place]  Patient/Family's  Response to care:  Pt and pt's granddaughter are both in agreement with DC to SNF.  They prefer [facility].  Patient/Family's Understanding of and Emotional Response to Diagnosis, Current Treatment, and Prognosis:  Pt and pt's granddaughter both verbalized their understanding of this DC and were in agreement with it.    Emotional Assessment Appearance:  Appears older than stated age Attitude/Demeanor/Rapport:   (cooperative) Affect (typically observed):  Appropriate Orientation:  Oriented to Self, Oriented to Place, Oriented to  Time, Oriented to Situation Alcohol / Substance use:  Never Used Psych involvement (Current and /or in the community):  No (Comment)  Discharge Needs  Concerns to be addressed:  Care Coordination Readmission within the last 30 days:  No Current discharge risk:  Physical Impairment Barriers to Discharge:  No Barriers Identified   Mathews Argyle, LCSW 01/28/2015, 10:06 AM

## 2015-01-28 NOTE — Clinical Social Work Note (Signed)
CSW notified RN, pt and pt's granddaughter that pt would DC to [facility] today.  Grandaughter will transport.  CSW signing off.

## 2015-01-28 NOTE — Progress Notes (Signed)
Report called to jennifer as [facility]. Discharge instructions along with patient status gone over with RN. Patient to be discharged on ra. No distress noted. Iv and telemetry removed. Packet given to patient and caregiver, instructed to give to staff at facility. Patient to be taken off the floor via wheelchair.

## 2015-01-28 NOTE — Clinical Social Work Placement (Signed)
   CLINICAL SOCIAL WORK PLACEMENT  NOTE  Date:  01/28/2015  Patient Details  Name: Melissa Cochran MRN: [id number] Date of Birth: 12/08/1935  Clinical Social Work is seeking post-discharge placement for this patient at the [place] level of care (*CSW will initial, date and re-position this form in  chart as items are completed):  Yes   Patient/family provided with [facility] Work Department's list of facilities offering this level of care within the geographic area requested by the patient (or if unable, by the patient's family).  Yes   Patient/family informed of their freedom to choose among providers that offer the needed level of care, that participate in Medicare, Medicaid or managed care program needed by the patient, have an available bed and are willing to accept the patient.  Yes   Patient/family informed of [facility]'s ownership interest in [facility] and [facility], as well as of the fact that they are under no obligation to receive care at these facilities.  PASRR submitted to EDS on       PASRR number received on       Existing PASRR number confirmed on 01/28/15     FL2 transmitted to all facilities in geographic area requested by pt/family on 01/28/15     FL2 transmitted to all facilities within larger geographic area on       Patient informed that his/her managed care company has contracts with or will negotiate with certain facilities, including the following:   (Given SNF packet with informaton on it.)     Yes   Patient/family informed of bed offers received.  Patient chooses bed at  [facility])     Physician recommends and patient chooses bed at      Patient to be transferred to  [facility]) on  ([facility]).  Patient to be transferred to facility by  (Granddaughter.)     Patient family notified on 01/28/15 of transfer.  Name of family member notified:        PHYSICIAN       Additional Comment:     _______________________________________________ Mathews Argyle, LCSW 01/28/2015, 1:09 PM

## 2015-01-28 NOTE — Discharge Summary (Signed)
[facility]   PATIENT NAME: Melissa Cochran    MR#:  [id number]  DATE OF BIRTH:  11-Jan-1936  DATE OF ADMISSION:  01/24/2015 ADMITTING PHYSICIAN: Henreitta Leber, MD  DATE OF DISCHARGE: 01/28/2015  PRIMARY CARE PHYSICIAN: Otilio Miu, MD    ADMISSION DIAGNOSIS:  Community acquired pneumonia [J18.9] ST segment depression [R94.31]  DISCHARGE DIAGNOSIS:  Active Problems:   Pneumonia   SECONDARY DIAGNOSIS:   Past Medical History  Diagnosis Date  . Hypertension   . Hyperlipemia   . Heart block     left  . Adnexal mass   . Bleeding ulcer     HOSPITAL COURSE:    Pneumonia  #1 community-acquired pneumonia: Admission chest x-ray showing right middle lobe pneumonia and possible left perihilar pneumonia. Repeat chest x-ray on 9/19 shows improved aeration and resolving pneumonia. Shortness of breath, states cough, sputum production and leukocytosis all improved. Blood cultures negative. She will continue with Levaquin to complete a seven-day course.  #2 NSTEMI: She was seen by cardiology during this hospitalization. Elevated troponins most likely due to strain from pneumonia and shortness of breath on admission. Troponins have trended downward without intervention. 2-D echocardiogram shows normal ejection fraction of 55-60%. No valvular abnormalities. Continue beta blocker, Plavix, blood pressure control.   #3: Hypertension: Blood pressure elevated during hospitalization. I have increased Norvasc and continued metoprolol.  #4 deconditioning: She complained of feeling weak and off balance. She was unable to make bathroom on her own. Physical therapy recommendation is for skilled nursing facility placement. This has been discussed with the patient and her family and she has agreed to short-term rehabilitation.  DISCHARGE CONDITIONS:   Stable  CONSULTS OBTAINED:  Treatment Team:  Yolonda Kida,  MD  DRUG ALLERGIES:  No Known Allergies  DISCHARGE MEDICATIONS:   Current Discharge Medication List    START taking these medications   Details  albuterol (PROVENTIL HFA;VENTOLIN HFA) 108 (90 BASE) MCG/ACT inhaler Inhale 2 puffs into the lungs every 6 (six) hours as needed for wheezing or shortness of breath. Qty: 1 Inhaler, Refills: 2    levofloxacin (LEVAQUIN) 750 MG tablet Take 1 tablet (750 mg total) by mouth daily. Qty: 5 tablet, Refills: 0    predniSONE (DELTASONE) 50 MG tablet Take one tablet with breakfast for 3 days and then stop. Qty: 3 tablet, Refills: 0      CONTINUE these medications which have CHANGED   Details  amLODipine (NORVASC) 5 MG tablet Take 1 tablet (5 mg total) by mouth daily. Qty: 30 tablet, Refills: 1      CONTINUE these medications which have NOT CHANGED   Details  acetaminophen (TYLENOL) 325 MG tablet Take 650 mg by mouth every 6 (six) hours as needed for mild pain or headache.     clopidogrel (PLAVIX) 75 MG tablet Take 75 mg by mouth daily.    metoprolol (LOPRESSOR) 100 MG tablet Take 100 mg by mouth 2 (two) times daily.    furosemide (LASIX) 20 MG tablet Take 1 tablet (20 mg total) by mouth daily. As needed for leg swelling. Take potassium when taking furosemide. Qty: 30 tablet, Refills: 2   Associated Diagnoses: Essential hypertension    gabapentin (NEURONTIN) 100 MG capsule Take 1 capsule (100 mg total) by mouth 3 (three) times daily. Qty: 90 capsule, Refills: 3   Associated Diagnoses: Neuropathy    nitrofurantoin, macrocrystal-monohydrate, (MACROBID) 100 MG capsule Take 1 capsule (100 mg total) by mouth  2 (two) times daily. Qty: 14 capsule, Refills: 0    potassium chloride (K-DUR) 10 MEQ tablet Take 1 tablet (10 mEq total) by mouth 2 (two) times a week. Take with doses of furosemide, to keep potassium from getting too low. Qty: 30 tablet, Refills: 2   Associated Diagnoses: Essential hypertension         DISCHARGE INSTRUCTIONS:      DIET:  Cardiac diet  DISCHARGE CONDITION:  Stable  ACTIVITY:  Activity as tolerated  OXYGEN:  Home Oxygen: No.   Oxygen Delivery: room air  DISCHARGE LOCATION:  nursing home   If you experience worsening of your admission symptoms, develop shortness of breath, life threatening emergency, suicidal or homicidal thoughts you must seek medical attention immediately by calling 911 or calling your MD immediately  if symptoms less severe.  You Must read complete instructions/literature along with all the possible adverse reactions/side effects for all the Medicines you take and that have been prescribed to you. Take any new Medicines after you have completely understood and accpet all the possible adverse reactions/side effects.   Please note  You were cared for by a hospitalist during your hospital stay. If you have any questions about your discharge medications or the care you received while you were in the hospital after you are discharged, you can call the unit and asked to speak with the hospitalist on call if the hospitalist that took care of you is not available. Once you are discharged, your primary care physician will handle any further medical issues. Please note that NO REFILLS for any discharge medications will be authorized once you are discharged, as it is imperative that you return to your primary care physician (or establish a relationship with a primary care physician if you do not have one) for your aftercare needs so that they can reassess your need for medications and monitor your lab values.  Today   CHIEF COMPLAINT:   Chief Complaint  Patient presents with  . Shortness of Breath    HISTORY OF PRESENT ILLNESS:  Tremeka Helbling is a 79 y.o. female with a known history of hypertension, hyperlipidemia, history of coronary disease status post stent, GERD, who presented to the hospital due to shortness of breath cough and malaise ongoing for the past week to 10  days. Patient was seen at urgent care at [facility] and recently diagnosed with a UTI and put on antibiotics for 10 days. She has finished her antibiotics but clinically does not feel any much better. She went to see her primary care physician who then referred her to the ER for further evaluation. In the emergency room patient had a chest x-ray which showed a right middle lobe pneumonia. She was also noted to have leukocytosis and also noted to have EKG changes in the lateral leads with some ST depressions. She does complain of intermittent chest pain which is more related to cough. She denies any diaphoresis, fevers, chills. She does admit to a cough which is productive at times. She also admitted to admit to intermittent nausea and vomiting and poor by mouth intake for about a week. Given her above symptoms hospitalist services were contacted further treatment and evaluation.  VITAL SIGNS:  Blood pressure 149/76, pulse 60, temperature 97.8 F (36.6 C), temperature source Oral, resp. rate 16, height 5\' 8"  (1.727 m), weight 68.493 kg (151 lb), SpO2 96 %.  I/O:    Intake/Output Summary (Last 24 hours) at 01/28/15 0913 Last data filed at 01/28/15 [phone]  Gross per 24 hour  Intake    720 ml  Output   1150 ml  Net   -430 ml    PHYSICAL EXAMINATION:  GENERAL:  79 y.o.-year-old patient lying in the bed with no acute distress.  EYES: Pupils equal, round, reactive to light and accommodation. No scleral icterus. Extraocular muscles intact.  HEENT: Head atraumatic, normocephalic. Oropharynx and nasopharynx clear.  NECK:  Supple, no jugular venous distention. No thyroid enlargement, no tenderness.  LUNGS: Normal breath sounds bilaterally, no wheezing, rales,rhonchi or crepitation. No use of accessory muscles of respiration. Good air movement CARDIOVASCULAR: S1, S2 normal. No murmurs, rubs, or gallops.  ABDOMEN: Soft, non-tender, non-distended. Bowel sounds present. No organomegaly or mass.  EXTREMITIES: No  pedal edema, cyanosis, or clubbing. 2+ pulses NEUROLOGIC: Cranial nerves II through XII are intact. Muscle strength 5/5 in all extremities. Sensation intact. Gait not checked.  PSYCHIATRIC: The patient is alert and oriented x 3. Somewhat anxious SKIN: No obvious rash, lesion, or ulcer.   DATA REVIEW:   CBC  Recent Labs Lab 01/27/15 0356  WBC 9.8  HGB 12.1  HCT 36.5  PLT 303    Chemistries   Recent Labs Lab 01/26/15 0435  NA 139  K 3.9  CL 106  CO2 27  GLUCOSE 102*  BUN 15  CREATININE 0.43*  CALCIUM 8.7*    Cardiac Enzymes  Recent Labs Lab 01/26/15 1240  TROPONINI 0.15*    Microbiology Results  Results for orders placed or performed during the hospital encounter of 01/24/15  Blood culture (routine x 2)     Status: None (Preliminary result)   Collection Time: 01/24/15  6:15 PM  Result Value Ref Range Status   Specimen Description BLOOD LEFT ASSIST CONTROL  Final   Special Requests   Final    BOTTLES DRAWN AEROBIC AND ANAEROBIC  AER 6CC ANA 3CC   Culture NO GROWTH 4 DAYS  Final   Report Status PENDING  Incomplete  Blood culture (routine x 2)     Status: None (Preliminary result)   Collection Time: 01/24/15  6:26 PM  Result Value Ref Range Status   Specimen Description BLOOD LEFT ASSIST CONTROL  Final   Special Requests BOTTLES DRAWN AEROBIC AND ANAEROBIC  5CC  Final   Culture NO GROWTH 4 DAYS  Final   Report Status PENDING  Incomplete  Urine culture     Status: None   Collection Time: 01/24/15  8:06 PM  Result Value Ref Range Status   Specimen Description URINE, CLEAN CATCH  Final   Special Requests Normal  Final   Culture MULTIPLE SPECIES PRESENT, SUGGEST RECOLLECTION  Final   Report Status 01/26/2015 FINAL  Final    RADIOLOGY:  Dg Chest 2 View  01/27/2015   CLINICAL DATA:  Follow-up pneumonia.  Former smoker.  EXAM: CHEST  2 VIEW  COMPARISON:  Chest x-ray dated 01/24/2015.  FINDINGS: There is improved aeration at the right lung base compatible with  resolving pneumonia. No new lung findings. Lungs remain at least mildly hyperexpanded suggesting COPD.  Cardiomediastinal silhouette is stable in size and configuration. Atherosclerotic changes again noted along the walls of the grossly normal-caliber thoracic aorta. No pleural effusion. No pneumothorax. No acute osseous abnormality.  IMPRESSION: Improved aeration at the right lung base compatible with resolving pneumonia.  Probable COPD.   Electronically Signed   By: Franki Cabot M.D.   On: 01/27/2015 10:56    EKG:   Orders placed or performed during the hospital encounter  of 01/24/15  . ED EKG  . ED EKG  . EKG 12-Lead  . EKG 12-Lead  . EKG 12-Lead  . EKG 12-Lead  . ED EKG  . ED EKG      Management plans discussed with the patient, family and they are in agreement.  CODE STATUS:     Code Status Orders        Start     Ordered   01/24/15 2130  Full code   Continuous     01/24/15 2130      TOTAL TIME TAKING CARE OF THIS PATIENT:35 minutes.  Greater than 50% of time spent in care coordination and counseling.  Myrtis Ser M.D on 01/28/2015 at 9:13 AM  Between 7am to 6pm - Pager - [phone]  After 6pm go to [email or web address] - password [email or web address] [facility]  [facility]  Office  [phone]  CC: Primary care physician; Otilio Miu, MD

## 2015-01-29 LAB — CULTURE, BLOOD (ROUTINE X 2)
Culture: NO GROWTH
Culture: NO GROWTH

## 2015-02-04 DIAGNOSIS — I2510 Atherosclerotic heart disease of native coronary artery without angina pectoris: Secondary | ICD-10-CM | POA: Diagnosis not present

## 2015-02-04 DIAGNOSIS — J181 Lobar pneumonia, unspecified organism: Secondary | ICD-10-CM | POA: Diagnosis not present

## 2015-02-04 DIAGNOSIS — D72829 Elevated white blood cell count, unspecified: Secondary | ICD-10-CM | POA: Diagnosis not present

## 2015-02-04 DIAGNOSIS — I10 Essential (primary) hypertension: Secondary | ICD-10-CM | POA: Diagnosis not present

## 2015-02-11 ENCOUNTER — Inpatient Hospital Stay: Payer: Medicare Other | Admitting: Family Medicine

## 2015-02-24 ENCOUNTER — Encounter: Payer: Self-pay | Admitting: Family Medicine

## 2015-02-24 ENCOUNTER — Ambulatory Visit (INDEPENDENT_AMBULATORY_CARE_PROVIDER_SITE_OTHER): Payer: Medicare Other | Admitting: Family Medicine

## 2015-02-24 VITALS — BP 110/80 | HR 60 | Ht 68.0 in | Wt 156.0 lb

## 2015-02-24 DIAGNOSIS — I25119 Atherosclerotic heart disease of native coronary artery with unspecified angina pectoris: Secondary | ICD-10-CM

## 2015-02-24 DIAGNOSIS — J189 Pneumonia, unspecified organism: Secondary | ICD-10-CM

## 2015-02-24 DIAGNOSIS — J410 Simple chronic bronchitis: Secondary | ICD-10-CM

## 2015-02-24 DIAGNOSIS — I10 Essential (primary) hypertension: Secondary | ICD-10-CM | POA: Diagnosis not present

## 2015-02-24 DIAGNOSIS — I214 Non-ST elevation (NSTEMI) myocardial infarction: Secondary | ICD-10-CM

## 2015-02-24 DIAGNOSIS — Z09 Encounter for follow-up examination after completed treatment for conditions other than malignant neoplasm: Secondary | ICD-10-CM

## 2015-02-24 MED ORDER — AMLODIPINE BESYLATE 5 MG PO TABS: 5.0000 mg | ORAL_TABLET | Freq: Every day | ORAL | Status: DC

## 2015-02-24 MED ORDER — METOPROLOL TARTRATE 100 MG PO TABS: 100.0000 mg | ORAL_TABLET | Freq: Two times a day (BID) | ORAL | Status: DC

## 2015-02-24 MED ORDER — CLOPIDOGREL BISULFATE 75 MG PO TABS: 75.0000 mg | ORAL_TABLET | Freq: Every day | ORAL | Status: DC

## 2015-02-24 NOTE — Progress Notes (Signed)
Name: Melissa Cochran   MRN: [id number]    DOB: Jun 15, 1935   Date:02/24/2015       Progress Note  Subjective  Chief Complaint  Chief Complaint  Patient presents with  . Hypothyroidism  . Hypertension  . Coronary Artery Disease    takes plavix  . Transition into care    HPI Comments: Hospital followup for pneumonia, NSTEMI, dehydration, and hypertension.  Hypertension This is a chronic problem. The current episode started more than 1 year ago. The problem has been waxing and waning since onset. The problem is controlled. Associated symptoms include shortness of breath. Pertinent negatives include no anxiety, blurred vision, chest pain, headaches, malaise/fatigue, neck pain, orthopnea, palpitations, peripheral edema, PND or sweats. There are no associated agents to hypertension. Risk factors for coronary artery disease include post-menopausal state. Past treatments include calcium channel blockers and beta blockers. The current treatment provides no improvement. There are no compliance problems.  Hypertensive end-organ damage includes CAD/MI. There is no history of angina, kidney disease, CVA, heart failure, left ventricular hypertrophy, PVD, renovascular disease or retinopathy. There is no history of chronic renal disease.  Coronary Artery Disease Presents for follow-up visit. The disease course has been improving. Symptoms include shortness of breath. Pertinent negatives include no chest pain, chest pressure, chest tightness, dizziness, leg swelling, muscle weakness, palpitations or weight gain. Risk factors include hypertension. Past treatments include beta blockers and calcium channel blockers. The treatment provided significant relief. Compliance with prior treatments has been good. There is no history of angina pectoris, arrhythmia, cardiomyopathy, CHF, past myocardial infarction, pericarditis or valvular heart disease. The symptoms have been stable.  Pneumonia She complains of cough,  difficulty breathing, shortness of breath and wheezing. Chronicity: followup. The current episode started 1 to 4 weeks ago. The problem has been gradually improving. The cough is non-productive. Associated symptoms include dyspnea on exertion. Pertinent negatives include no chest pain, headaches, malaise/fatigue, PND or sweats. Relieved by: hosp/antibiotics. She reports significant improvement on treatment. Her past medical history is significant for COPD.    No problem-specific assessment & plan notes found for this encounter.   Past Medical History  Diagnosis Date  . Hypertension   . Hyperlipemia   . Heart block     left  . Adnexal mass   . Bleeding ulcer     Past Surgical History  Procedure Laterality Date  . Cardiac surgery      cardiac cath  . Abdominal hysterectomy    . Stomach surgery    . Hip surgery      left    Family History  Problem Relation Age of Onset  . Stomach cancer Mother   . CVA Father     Social History   Social History  . Marital Status: Widowed    Spouse Name: N/A  . Number of Children: N/A  . Years of Education: N/A   Occupational History  . Not on file.   Social History Main Topics  . Smoking status: Former Smoker -- 1.00 packs/day for 40 years    Quit date: 06/13/1998  . Smokeless tobacco: Not on file  . Alcohol Use: No  . Drug Use: No  . Sexual Activity: Not Currently   Other Topics Concern  . Not on file   Social History Narrative    No Known Allergies   Review of Systems  Constitutional: Negative for weight gain and malaise/fatigue.  Eyes: Negative for blurred vision.  Respiratory: Positive for cough, shortness of breath and wheezing.  Negative for chest tightness.   Cardiovascular: Positive for dyspnea on exertion. Negative for chest pain, palpitations, orthopnea, leg swelling and PND.  Musculoskeletal: Negative for muscle weakness and neck pain.  Neurological: Negative for dizziness and headaches.      Objective  Filed Vitals:   02/24/15 1101  BP: 110/80  Pulse: 60  Height: 5\' 8"  (1.727 m)  Weight: 156 lb (70.761 kg)    Physical Exam  Constitutional: She is well-developed, well-nourished, and in no distress. No distress.  HENT:  Head: Normocephalic and atraumatic.  Right Ear: External ear normal.  Left Ear: External ear normal.  Nose: Nose normal.  Mouth/Throat: Oropharynx is clear and moist.  Eyes: Conjunctivae and EOM are normal. Pupils are equal, round, and reactive to light. Right eye exhibits no discharge. Left eye exhibits no discharge.  Neck: Normal range of motion. Neck supple. No JVD present. No thyromegaly present.  Cardiovascular: Normal rate, regular rhythm, normal heart sounds and intact distal pulses.  Exam reveals no gallop and no friction rub.   No murmur heard. Pulmonary/Chest: Effort normal and breath sounds normal.  Abdominal: Soft. Bowel sounds are normal. She exhibits no mass. There is no tenderness. There is no guarding.  Musculoskeletal: Normal range of motion. She exhibits no edema.  Lymphadenopathy:    She has no cervical adenopathy.  Neurological: She is alert. She has normal reflexes.  Skin: Skin is warm and dry. She is not diaphoretic.  Psychiatric: Mood and affect normal.      Assessment & Plan  Problem List Items Addressed This Visit      Cardiovascular and Mediastinum   Essential hypertension   Relevant Medications   metoprolol (LOPRESSOR) 100 MG tablet    Other Visit Diagnoses    Hospital discharge follow-up    -  Primary    Pneumonia, community acquired        Simple chronic bronchitis ([place])        Coronary artery disease involving native coronary artery of native heart with angina pectoris (HCC)        Relevant Medications    metoprolol (LOPRESSOR) 100 MG tablet    Non-ST elevation myocardial infarction (NSTEMI), subsequent episode of care ([place])        Relevant Medications    metoprolol (LOPRESSOR) 100 MG tablet          Dr. [facility]  02/24/2015

## 2015-03-19 DIAGNOSIS — Z9889 Other specified postprocedural states: Secondary | ICD-10-CM | POA: Diagnosis not present

## 2015-03-19 DIAGNOSIS — E7800 Pure hypercholesterolemia, unspecified: Secondary | ICD-10-CM | POA: Diagnosis not present

## 2015-03-19 DIAGNOSIS — I10 Essential (primary) hypertension: Secondary | ICD-10-CM | POA: Diagnosis not present

## 2015-05-06 ENCOUNTER — Ambulatory Visit
Admission: EM | Admit: 2015-05-06 | Discharge: 2015-05-06 | Disposition: A | Discharge status: Home / Self Care | Payer: Medicare Other | Attending: Family Medicine | Admitting: Family Medicine

## 2015-05-06 DIAGNOSIS — R1030 Lower abdominal pain, unspecified: Secondary | ICD-10-CM | POA: Insufficient documentation

## 2015-05-06 DIAGNOSIS — R5381 Other malaise: Secondary | ICD-10-CM | POA: Diagnosis not present

## 2015-05-06 DIAGNOSIS — N390 Urinary tract infection, site not specified: Secondary | ICD-10-CM | POA: Diagnosis not present

## 2015-05-06 DIAGNOSIS — Z8701 Personal history of pneumonia (recurrent): Secondary | ICD-10-CM | POA: Diagnosis not present

## 2015-05-06 DIAGNOSIS — IMO0001 Reserved for inherently not codable concepts without codable children: Secondary | ICD-10-CM

## 2015-05-06 DIAGNOSIS — R030 Elevated blood-pressure reading, without diagnosis of hypertension: Secondary | ICD-10-CM | POA: Diagnosis not present

## 2015-05-06 DIAGNOSIS — R110 Nausea: Secondary | ICD-10-CM | POA: Diagnosis not present

## 2015-05-06 DIAGNOSIS — R5383 Other fatigue: Secondary | ICD-10-CM

## 2015-05-06 LAB — URINALYSIS COMPLETE WITH MICROSCOPIC (ARMC ONLY)
Bilirubin Urine: NEGATIVE
Glucose, UA: NEGATIVE mg/dL
KETONES UR: NEGATIVE mg/dL
NITRITE: NEGATIVE
PH: 6 (ref 5.0–8.0)
PROTEIN: 100 mg/dL — AB
Specific Gravity, Urine: 1.025 (ref 1.005–1.030)

## 2015-05-06 NOTE — Discharge Instructions (Signed)
Antibiotic Medicine Antibiotic medicines are used to treat infections caused by bacteria. They work by hurting or killing the germs that are making you sick. HOW WILL MY MEDICINE BE PICKED? There are many kinds of antibiotic medicines. To help your doctor pick one, tell your doctor if:  You have any allergies.  You are pregnant or plan to get pregnant.  You are breastfeeding.  You are taking any medicines. These include over-the-counter medicines, prescription medicines, and herbal remedies.  You have a medical condition or problem. If you have questions about why your medicine was picked, ask. FOR HOW LONG SHOULD I TAKE MY MEDICINE? Take your medicine for as long as your doctor tells you to. Do not stop taking it when you feel better. If you stop taking it too soon:  You may start to feel sick again.  Your infection may get harder to treat.  New problems may develop. WHAT IF I MISS A DOSE? Try not to miss any doses of antibiotic medicine. If you miss a dose:  Take the dose as soon as you can.  If you are taking 2 doses a day, take the next dose in 5 to 6 hours.  If you are taking 3 or more doses a day, take the next dose in 2 to 4 hours. Then go back to the normal schedule. If you cannot take a missed dose, take the next dose on time. Then take the missed dose after you have taken all the doses as told by your doctor, as if you had one more dose left. DOES THIS MEDICINE AFFECT BIRTH CONTROL? Birth control pills may not work while you are on antibiotic medicines. If you are taking birth control pills, keep taking them as usual. Use a second form of birth control, such as a condom. Keep using the second form of birth control until you are finished with your current 1 month cycle of birth control pills. GET HELP IF:  You get worse.  You do not feel better a few days after starting the medicine.  You throw up (vomit).  There are white patches in your mouth.  You have new  joint pain after starting the medicine.  You have new muscle aches after starting the medicine.  You had a fever before starting the medicine, and it comes back.  You have any symptoms of an allergic reaction, such as an itchy rash. If this happens, stop taking the medicine. GET HELP RIGHT AWAY IF:  Your pee (urine) turns dark or becomes blood-colored.  Your skin turns yellow.  You bruise or bleed easily.  You have very bad watery poop (diarrhea) and cramps in your belly (abdomen).  You have a very bad headache.  You have signs of a very bad allergic reaction, such as:  Trouble breathing.  Wheezing.  Swelling of the lips, tongue, or face.  Fainting.  Blisters on the skin or in the mouth. If you have signs of a very bad allergic reaction, stop taking the antibiotic medicine right away.   This information is not intended to replace advice given to you by your health care provider. Make sure you discuss any questions you have with your health care provider.   Document Released: 02/03/2008 Document Revised: 01/15/2015 Document Reviewed: 09/11/2014 Elsevier Interactive Patient Education 2016 [employer].  Hypertension Hypertension, commonly called high blood pressure, is when the force of blood pumping through your arteries is too strong. Your arteries are the blood vessels that carry blood from your  heart throughout your body. A blood pressure reading consists of a higher number over a lower number, such as 110/72. The higher number (systolic) is the pressure inside your arteries when your heart pumps. The lower number (diastolic) is the pressure inside your arteries when your heart relaxes. Ideally you want your blood pressure below 120/80. Hypertension forces your heart to work harder to pump blood. Your arteries may become narrow or stiff. Having untreated or uncontrolled hypertension can cause heart attack, stroke, kidney disease, and other problems. RISK FACTORS Some risk  factors for high blood pressure are controllable. Others are not.  Risk factors you cannot control include:   Race. You may be at higher risk if you are African American.  Age. Risk increases with age.  Gender. Men are at higher risk than women before age 16 years. After age 13, women are at higher risk than men. Risk factors you can control include:  Not getting enough exercise or physical activity.  Being overweight.  Getting too much fat, sugar, calories, or salt in your diet.  Drinking too much alcohol. SIGNS AND SYMPTOMS Hypertension does not usually cause signs or symptoms. Extremely high blood pressure (hypertensive crisis) may cause headache, anxiety, shortness of breath, and nosebleed. DIAGNOSIS To check if you have hypertension, your health care provider will measure your blood pressure while you are seated, with your arm held at the level of your heart. It should be measured at least twice using the same arm. Certain conditions can cause a difference in blood pressure between your right and left arms. A blood pressure reading that is higher than normal on one occasion does not mean that you need treatment. If it is not clear whether you have high blood pressure, you may be asked to return on a different day to have your blood pressure checked again. Or, you may be asked to monitor your blood pressure at home for 1 or more weeks. TREATMENT Treating high blood pressure includes making lifestyle changes and possibly taking medicine. Living a healthy lifestyle can help lower high blood pressure. You may need to change some of your habits. Lifestyle changes may include:  Following the DASH diet. This diet is high in fruits, vegetables, and whole grains. It is low in salt, red meat, and added sugars.  Keep your sodium intake below 2,300 mg per day.  Getting at least 30-45 minutes of aerobic exercise at least 4 times per week.  Losing weight if necessary.  Not  smoking.  Limiting alcoholic beverages.  Learning ways to reduce stress. Your health care provider may prescribe medicine if lifestyle changes are not enough to get your blood pressure under control, and if one of the following is true:  You are 39-73 years of age and your systolic blood pressure is above 140.  You are 82 years of age or older, and your systolic blood pressure is above 150.  Your diastolic blood pressure is above 90.  You have diabetes, and your systolic blood pressure is over [id number] or your diastolic blood pressure is over 90.  You have kidney disease and your blood pressure is above 140/90.  You have heart disease and your blood pressure is above 140/90. Your personal target blood pressure may vary depending on your medical conditions, your age, and other factors. HOME CARE INSTRUCTIONS  Have your blood pressure rechecked as directed by your health care provider.   Take medicines only as directed by your health care provider. Follow the directions carefully. Blood  pressure medicines must be taken as prescribed. The medicine does not work as well when you skip doses. Skipping doses also puts you at risk for problems.  Do not smoke.   Monitor your blood pressure at home as directed by your health care provider. SEEK MEDICAL CARE IF:   You think you are having a reaction to medicines taken.  You have recurrent headaches or feel dizzy.  You have swelling in your ankles.  You have trouble with your vision. SEEK IMMEDIATE MEDICAL CARE IF:  You develop a severe headache or confusion.  You have unusual weakness, numbness, or feel faint.  You have severe chest or abdominal pain.  You vomit repeatedly.  You have trouble breathing. MAKE SURE YOU:   Understand these instructions.  Will watch your condition.  Will get help right away if you are not doing well or get worse.   This information is not intended to replace advice given to you by your health  care provider. Make sure you discuss any questions you have with your health care provider.   Document Released: 04/26/2005 Document Revised: 09/10/2014 Document Reviewed: 02/16/2013 Elsevier Interactive Patient Education [employer].

## 2015-05-06 NOTE — ED Provider Notes (Signed)
CSN: [id number]     Arrival date & time 05/06/15  1226 History   First MD Initiated Contact with Patient 05/06/15 1548    Nurses notes were reviewed. Chief Complaint  Patient presents with  . Vague Complaints    Patient with vague complaints.  #1 general malaise. This has been going off and on since her diagnosis of pneumonia back in September requiring respite care at [facility] for a few weeks. She's been trying to go to work but that having a difficult time. She missed work today and wants a note for today and tomorrow. #2 history pneumonia  #3 vague lower abdominal pain. The last 2-3 days she's been having pain in the lower abdomen vague symptoms of fullness as well. #4 nausea she's also had nausea as well.  #5 history of heart disease. She states she's had a history of heart disease needs to have no blockage reversed her open has not been well enough to have that done yet.  #6 elevated blood pressure. She states her blood pressure always goes up when she's not feeling well. She minimizes that at this time.     (Consider location/radiation/quality/duration/timing/severity/associated sxs/prior Treatment) Patient is a 79 y.o. female presenting with abdominal pain. The history is provided by the patient. No language interpreter was used.  Abdominal Pain Pain location:  Generalized Pain quality: bloating   Pain radiates to:  Does not radiate Pain severity:  Moderate Onset quality:  Sudden Duration:  2 days Progression:  Waxing and waning Chronicity:  New Worsened by:  Nothing tried Associated symptoms: dysuria, fatigue and nausea   Associated symptoms: no chest pain   Fatigue:    Severity:  Moderate   Timing:  Intermittent   Progression:  Waxing and waning Risk factors: being elderly and recent hospitalization     Past Medical History  Diagnosis Date  . Hypertension   . Hyperlipemia   . Heart block     left  . Adnexal mass   . Bleeding ulcer    Past  Surgical History  Procedure Laterality Date  . Cardiac surgery      cardiac cath  . Abdominal hysterectomy    . Stomach surgery    . Hip surgery      left   Family History  Problem Relation Age of Onset  . Stomach cancer Mother   . CVA Father    Social History  Substance Use Topics  . Smoking status: Former Smoker -- 1.00 packs/day for 40 years    Quit date: 06/13/1998  . Smokeless tobacco: None  . Alcohol Use: No   OB History    No data available     Review of Systems  Constitutional: Positive for fatigue.  Cardiovascular: Negative for chest pain.  Gastrointestinal: Positive for nausea and abdominal pain.  Genitourinary: Positive for dysuria.  All other systems reviewed and are negative.   Allergies  Review of patient's allergies indicates no known allergies.  Home Medications   Prior to Admission medications   Medication Sig Start Date End Date Taking? Authorizing Provider  acetaminophen (TYLENOL) 325 MG tablet Take 650 mg by mouth every 6 (six) hours as needed for mild pain or headache.    Yes Historical Provider, MD  albuterol (PROVENTIL HFA;VENTOLIN HFA) 108 (90 BASE) MCG/ACT inhaler Inhale 2 puffs into the lungs every 6 (six) hours as needed for wheezing or shortness of breath. 01/28/15  Yes Aldean Jewett, MD  clopidogrel (PLAVIX) 75 MG tablet Take 1  tablet (75 mg total) by mouth daily. 02/24/15  Yes [name], MD  furosemide (LASIX) 20 MG tablet Take 1 tablet (20 mg total) by mouth daily. As needed for leg swelling. Take potassium when taking furosemide. Patient taking differently: Take 20 mg by mouth daily. As needed for leg swelling. Take potassium when taking furosemide.- Dr Sheppard Coil 12/09/14  Yes [name], MD  gabapentin (NEURONTIN) 100 MG capsule Take 1 capsule (100 mg total) by mouth 3 (three) times daily. 12/09/14  Yes [name], MD  metoprolol (LOPRESSOR) 100 MG tablet Take 1 tablet (100 mg total) by mouth 2 (two) times daily. 02/24/15  Yes  [name], MD  potassium chloride (K-DUR) 10 MEQ tablet Take 1 tablet (10 mEq total) by mouth 2 (two) times a week. Take with doses of furosemide, to keep potassium from getting too low. 12/09/14  Yes [name], MD   Meds Ordered and Administered this Visit  Medications - No data to display  BP 200/82 mmHg  Pulse 56  Temp(Src) 96.6 F (35.9 C) (Tympanic)  Resp 20  Ht 5\' 8"  (1.727 m)  Wt 154 lb (69.854 kg)  BMI 23.42 kg/m2  SpO2 98% No data found.   Physical Exam  Constitutional: She is oriented to person, place, and time.  Thin white female appears chronically ill  HENT:  Head: Normocephalic and atraumatic.  Eyes: Pupils are equal, round, and reactive to light.  Neck: Neck supple.  Cardiovascular: Normal rate, regular rhythm and normal heart sounds.   Pulmonary/Chest: Effort normal and breath sounds normal.  Abdominal: Soft. She exhibits no distension. There is no tenderness. There is no guarding.  Musculoskeletal: Normal range of motion.  Neurological: She is alert and oriented to person, place, and time.  Skin: Skin is warm and dry.  Psychiatric: Her behavior is normal. Her mood appears anxious. Her speech is rapid and/or pressured. Cognition and memory are not impaired. She does not express impulsivity.  Vitals reviewed.   ED Course  Procedures (including critical care time)  Labs Review Labs Reviewed  URINALYSIS COMPLETEWITH MICROSCOPIC ([facility] ONLY) - Abnormal; Notable for the following:    APPearance CLOUDY (*)    Hgb urine dipstick 2+ (*)    Protein, ur 100 (*)    Leukocytes, UA TRACE (*)    Bacteria, UA MANY (*)    Squamous Epithelial / LPF 0-5 (*)    All other components within normal limits  URINE CULTURE    Imaging Review No results found.   Visual Acuity Review  Right Eye Distance:   Left Eye Distance:   Bilateral Distance:    Right Eye Near:   Left Eye Near:    Bilateral Near:     Results for orders placed or performed during the  hospital encounter of 05/06/15  Urinalysis complete, with microscopic  Result Value Ref Range   Color, Urine YELLOW YELLOW   APPearance CLOUDY (A) CLEAR   Glucose, UA NEGATIVE NEGATIVE mg/dL   Bilirubin Urine NEGATIVE NEGATIVE   Ketones, ur NEGATIVE NEGATIVE mg/dL   Specific Gravity, Urine 1.025 1.005 - 1.030   Hgb urine dipstick 2+ (A) NEGATIVE   pH 6.0 5.0 - 8.0   Protein, ur 100 (A) NEGATIVE mg/dL   Nitrite NEGATIVE NEGATIVE   Leukocytes, UA TRACE (A) NEGATIVE   RBC / HPF TOO NUMEROUS TO COUNT 0 - 5 RBC/hpf   WBC, UA TOO NUMEROUS TO COUNT 0 - 5 WBC/hpf   Bacteria, UA MANY (  A) NONE SEEN   Squamous Epithelial / LPF 0-5 (A) NONE SEEN   Trans Epithel, UA PRESENT       MDM   1. UTI (lower urinary tract infection)   2. Malaise and fatigue   3. Elevated BP   4. Hx of bacterial pneumonia   5. Nausea    Patient with her history of pneumonia and other medical problems will treat the UTI with an antibiotic that'll cover respiratory problems as well. As far as some vague abdominal pain symptoms at this time is probably coming from the UTI. For the elevated blood pressure this is happened to her before but if it doesn't respond to treating the UTI she will need to see her PCP Dr. Ronnald Ramp or cardiologist this week. For the nausea place on Zofran. We'll give her a work note for today and tomorrow.    Frederich Cha, MD 05/06/15 [phone]

## 2015-05-06 NOTE — ED Notes (Signed)
States had pneumonia 09/16. "I have not felt good since then". C/o intermittent vague abdominal pain since last Thursday but none now

## 2015-05-07 ENCOUNTER — Telehealth: Payer: Self-pay

## 2015-05-07 NOTE — ED Notes (Signed)
Patient called from [facility] stating no Rx sent in by Dr. Alveta Heimlich. Dr. Zenda Alpers reviewed chart and this nurse called in Bactrim DS Take 1 po bid x 5 days #10. No refills. Also Zofran 8mg  ODT. Take 1 po bid prn. #4. No refills.

## 2015-05-08 LAB — URINE CULTURE

## 2015-05-08 MED ORDER — ONDANSETRON 8 MG PO TBDP: 8.0000 mg | ORAL_TABLET | Freq: Three times a day (TID) | ORAL | Status: DC | PRN

## 2015-05-08 MED ORDER — CEFUROXIME AXETIL 500 MG PO TABS: 500.0000 mg | ORAL_TABLET | Freq: Two times a day (BID) | ORAL | Status: DC

## 2015-05-08 NOTE — Telephone Encounter (Signed)
Patient apparently did call yesterday about her antibiotic. Ceftin 500 mg twice day for 10 days was called in to the drugstore her choice but apparently the order is being signed consent was signed and held. I have since his release button but please call the patient and make sure that she has gotten the medication.

## 2015-06-27 ENCOUNTER — Ambulatory Visit
Admission: EM | Admit: 2015-06-27 | Discharge: 2015-06-27 | Disposition: A | Discharge status: Home / Self Care | Payer: Medicare Other | Attending: Family Medicine | Admitting: Family Medicine

## 2015-06-27 ENCOUNTER — Encounter: Payer: Self-pay | Admitting: *Deleted

## 2015-06-27 ENCOUNTER — Ambulatory Visit: Payer: Medicare Other

## 2015-06-27 DIAGNOSIS — Z79899 Other long term (current) drug therapy: Secondary | ICD-10-CM | POA: Insufficient documentation

## 2015-06-27 DIAGNOSIS — E785 Hyperlipidemia, unspecified: Secondary | ICD-10-CM | POA: Diagnosis not present

## 2015-06-27 DIAGNOSIS — I10 Essential (primary) hypertension: Secondary | ICD-10-CM | POA: Diagnosis not present

## 2015-06-27 DIAGNOSIS — R5381 Other malaise: Secondary | ICD-10-CM | POA: Insufficient documentation

## 2015-06-27 DIAGNOSIS — R911 Solitary pulmonary nodule: Secondary | ICD-10-CM

## 2015-06-27 DIAGNOSIS — R5383 Other fatigue: Secondary | ICD-10-CM | POA: Insufficient documentation

## 2015-06-27 DIAGNOSIS — R112 Nausea with vomiting, unspecified: Secondary | ICD-10-CM | POA: Diagnosis not present

## 2015-06-27 DIAGNOSIS — R319 Hematuria, unspecified: Secondary | ICD-10-CM | POA: Insufficient documentation

## 2015-06-27 DIAGNOSIS — Z87891 Personal history of nicotine dependence: Secondary | ICD-10-CM | POA: Diagnosis not present

## 2015-06-27 DIAGNOSIS — R110 Nausea: Secondary | ICD-10-CM | POA: Diagnosis not present

## 2015-06-27 DIAGNOSIS — R05 Cough: Secondary | ICD-10-CM | POA: Diagnosis not present

## 2015-06-27 DIAGNOSIS — R0602 Shortness of breath: Secondary | ICD-10-CM | POA: Diagnosis not present

## 2015-06-27 LAB — BASIC METABOLIC PANEL
Anion gap: 7 (ref 5–15)
BUN: 22 mg/dL — AB (ref 6–20)
CHLORIDE: 102 mmol/L (ref 101–111)
CO2: 28 mmol/L (ref 22–32)
CREATININE: 0.52 mg/dL (ref 0.44–1.00)
Calcium: 9.1 mg/dL (ref 8.9–10.3)
GFR calc Af Amer: >60 mL/min (ref >60)
GFR calc non Af Amer: >60 mL/min (ref >60)
Glucose, Bld: 91 mg/dL (ref 65–99)
POTASSIUM: 4 mmol/L (ref 3.5–5.1)
SODIUM: 137 mmol/L (ref 135–145)

## 2015-06-27 LAB — CBC WITH DIFFERENTIAL/PLATELET
Basophils Absolute: 0 10*3/uL (ref 0–0.1)
Basophils Relative: 0 %
EOS PCT: 0 %
Eosinophils Absolute: 0 10*3/uL (ref 0–0.7)
HEMATOCRIT: 42.5 % (ref 35.0–47.0)
Hemoglobin: 13.6 g/dL (ref 12.0–16.0)
LYMPHS ABS: 1.8 10*3/uL (ref 1.0–3.6)
LYMPHS PCT: 22 %
MCH: 28.5 pg (ref 26.0–34.0)
MCHC: 32.1 g/dL (ref 32.0–36.0)
MCV: 88.6 fL (ref 80.0–100.0)
MONO ABS: 0.5 10*3/uL (ref 0.2–0.9)
MONOS PCT: 6 %
Neutro Abs: 5.5 10*3/uL (ref 1.4–6.5)
Neutrophils Relative %: 72 %
PLATELETS: 274 10*3/uL (ref 150–440)
RBC: 4.79 MIL/uL (ref 3.80–5.20)
RDW: 13.1 % (ref 11.5–14.5)
WBC: 7.8 10*3/uL (ref 3.6–11.0)

## 2015-06-27 LAB — URINALYSIS COMPLETE WITH MICROSCOPIC (ARMC ONLY)
Bilirubin Urine: NEGATIVE
Glucose, UA: NEGATIVE mg/dL
Ketones, ur: NEGATIVE mg/dL
Nitrite: NEGATIVE
Protein, ur: 100 mg/dL — AB
SPECIFIC GRAVITY, URINE: 1.025 (ref 1.005–1.030)
pH: 7 (ref 5.0–8.0)

## 2015-06-27 MED ORDER — ONDANSETRON 8 MG PO TBDP: 8.0000 mg | ORAL_TABLET | Freq: Three times a day (TID) | ORAL | Status: DC | PRN

## 2015-06-27 NOTE — ED Provider Notes (Signed)
CSN: [id number]     Arrival date & time 06/27/15  0945 History   First MD Initiated Contact with Patient 06/27/15 1147    Nurses notes were reviewed. Chief Complaint  Patient presents with  . Nausea  . Emesis    Patient is a 80 year old white female with a history of nausea and some vomiting. She says she just doesn't feel well recently which didn't feel well to one of having pneumonia while in the hospital stay for several days. Normally she works at [employer] in [place]. This morning because of her nausea and sickness she was unable to go to work. She's had a history of recent UTIs as well as pneumonias. Both those illnesses have been diagnosed some different times with a complaint of nausea vomiting and not feeling well. She started to feel bloated better now but she is also has some of her blood pressure medicines removed when she was in the hospital states that the 70s when she doesn't feel well.  She is a former smoker she's had abdominal hysterectomy stomach surgery left hip surgery she's had cardiac surgery as well. She has history of hypertension hyperlipidemia heart block adrenal mass and bleeding ulcers before. Mother stomach cancer follows had a CVA in the past. She really otherwise stated that she was a former smoker she smoked a pack cigarettes for over 40 years.    (Consider location/radiation/quality/duration/timing/severity/associated sxs/prior Treatment) Patient is a 80 y.o. female presenting with vomiting. The history is provided by the patient.  Emesis Severity:  Moderate Timing:  Constant Quality:  Undigested food Progression:  Partially resolved Chronicity:  New Recent urination:  Increased Relieved by:  None tried Ineffective treatments:  None tried Associated symptoms: myalgias     Past Medical History  Diagnosis Date  . Hypertension   . Hyperlipemia   . Heart block     left  . Adnexal mass   . Bleeding ulcer    Past Surgical History   Procedure Laterality Date  . Cardiac surgery      cardiac cath  . Abdominal hysterectomy    . Stomach surgery    . Hip surgery      left   Family History  Problem Relation Age of Onset  . Stomach cancer Mother   . CVA Father    Social History  Substance Use Topics  . Smoking status: Former Smoker -- 1.00 packs/day for 40 years    Quit date: 06/13/1998  . Smokeless tobacco: None  . Alcohol Use: No   OB History    No data available     Review of Systems  Gastrointestinal: Positive for vomiting.  Musculoskeletal: Positive for myalgias.  All other systems reviewed and are negative.   Allergies  Review of patient's allergies indicates no known allergies.  Home Medications   Prior to Admission medications   Medication Sig Start Date End Date Taking? Authorizing Provider  acetaminophen (TYLENOL) 325 MG tablet Take 650 mg by mouth every 6 (six) hours as needed for mild pain or headache.    Yes Historical Provider, MD  albuterol (PROVENTIL HFA;VENTOLIN HFA) 108 (90 BASE) MCG/ACT inhaler Inhale 2 puffs into the lungs every 6 (six) hours as needed for wheezing or shortness of breath. 01/28/15  Yes Aldean Jewett, MD  clopidogrel (PLAVIX) 75 MG tablet Take 1 tablet (75 mg total) by mouth daily. 02/24/15  Yes [name], MD  furosemide (LASIX) 20 MG tablet Take 1 tablet (20 mg total) by mouth daily.  As needed for leg swelling. Take potassium when taking furosemide. Patient taking differently: Take 20 mg by mouth daily. As needed for leg swelling. Take potassium when taking furosemide.- Dr Sheppard Coil 12/09/14  Yes [name], MD  gabapentin (NEURONTIN) 100 MG capsule Take 1 capsule (100 mg total) by mouth 3 (three) times daily. 12/09/14  Yes [name], MD  metoprolol (LOPRESSOR) 100 MG tablet Take 1 tablet (100 mg total) by mouth 2 (two) times daily. 02/24/15  Yes [name], MD  cefUROXime (CEFTIN) 500 MG tablet Take 1 tablet (500 mg total) by mouth 2 (two) times daily.  05/08/15   Frederich Cha, MD  ondansetron (ZOFRAN ODT) 8 MG disintegrating tablet Take 1 tablet (8 mg total) by mouth every 8 (eight) hours as needed for nausea or vomiting. 05/08/15   Frederich Cha, MD  ondansetron (ZOFRAN ODT) 8 MG disintegrating tablet Take 1 tablet (8 mg total) by mouth every 8 (eight) hours as needed for nausea or vomiting. 06/27/15   Frederich Cha, MD  potassium chloride (K-DUR) 10 MEQ tablet Take 1 tablet (10 mEq total) by mouth 2 (two) times a week. Take with doses of furosemide, to keep potassium from getting too low. 12/09/14   [name], MD   Meds Ordered and Administered this Visit  Medications - No data to display  BP 207/80 mmHg  Pulse 68  Temp(Src) 97.7 F (36.5 C) (Oral)  Resp 18  Ht 5\' 8"  (1.727 m)  Wt 148 lb (67.132 kg)  BMI 22.51 kg/m2  SpO2 99% No data found.   Physical Exam  Constitutional: She appears well-developed. She has a sickly appearance.  Elderly white female sickly in appearance and older than stated age.  HENT:  Head: Normocephalic and atraumatic.  Eyes: Conjunctivae are normal. Pupils are equal, round, and reactive to light.  Neck: Normal range of motion.  Cardiovascular: Normal rate, regular rhythm and normal heart sounds.   Pulmonary/Chest: Effort normal and breath sounds normal. She has no wheezes.  Abdominal: Soft. She exhibits no distension. There is no tenderness.  Neurological: She is alert.  Skin: Skin is warm and dry.  Psychiatric: She has a normal mood and affect.  Vitals reviewed.   ED Course  Procedures (including critical care time)  Labs Review Labs Reviewed  URINALYSIS COMPLETEWITH MICROSCOPIC ([facility] ONLY) - Abnormal; Notable for the following:    APPearance CLOUDY (*)    Hgb urine dipstick 3+ (*)    Protein, ur 100 (*)    Leukocytes, UA TRACE (*)    Bacteria, UA FEW (*)    Squamous Epithelial / LPF 6-30 (*)    All other components within normal limits  BASIC METABOLIC PANEL - Abnormal; Notable for the  following:    BUN 22 (*)    All other components within normal limits  URINE CULTURE  CBC WITH DIFFERENTIAL/PLATELET    Imaging Review Dg Chest 2 View  06/27/2015  CLINICAL DATA:  Dry cough and noticeable shortness of breath. EXAM: CHEST  2 VIEW COMPARISON:  01/27/2015 and 01/24/2015 FINDINGS: Again noted is some hyperinflation of the lungs. Subtle density along the left cardiac border appears chronic. Question a 5 mm nodular density in the right upper lung versus right fifth rib. No significant airspace disease. Heart and mediastinum are grossly stable. The thoracic aorta is heavily calcified. Surgical clips in the upper abdomen. No large pleural effusions. No acute bone abnormality. Probable small hiatal hernia. IMPRESSION: Mild hyperinflation without acute chest findings. Indeterminate  5 mm nodular density in the right upper chest. This could be within a rib. Recommend six-month follow-up chest radiographs to evaluate this area. Electronically Signed   By: Markus Daft M.D.   On: 06/27/2015 12:39     Visual Acuity Review  Right Eye Distance:   Left Eye Distance:   Bilateral Distance:    Right Eye Near:   Left Eye Near:    Bilateral Near:     Results for orders placed or performed during the hospital encounter of 06/27/15  CBC with Differential  Result Value Ref Range   WBC 7.8 3.6 - 11.0 K/uL   RBC 4.79 3.80 - 5.20 MIL/uL   Hemoglobin 13.6 12.0 - 16.0 g/dL   HCT 42.5 35.0 - 47.0 %   MCV 88.6 80.0 - 100.0 fL   MCH 28.5 26.0 - 34.0 pg   MCHC 32.1 32.0 - 36.0 g/dL   RDW 13.1 11.5 - 14.5 %   Platelets 274 150 - 440 K/uL   Neutrophils Relative % 72 %   Neutro Abs 5.5 1.4 - 6.5 K/uL   Lymphocytes Relative 22 %   Lymphs Abs 1.8 1.0 - 3.6 K/uL   Monocytes Relative 6 %   Monocytes Absolute 0.5 0.2 - 0.9 K/uL   Eosinophils Relative 0 %   Eosinophils Absolute 0.0 0 - 0.7 K/uL   Basophils Relative 0 %   Basophils Absolute 0.0 0 - 0.1 K/uL  Urinalysis complete, with microscopic   Result Value Ref Range   Color, Urine YELLOW YELLOW   APPearance CLOUDY (A) CLEAR   Glucose, UA NEGATIVE NEGATIVE mg/dL   Bilirubin Urine NEGATIVE NEGATIVE   Ketones, ur NEGATIVE NEGATIVE mg/dL   Specific Gravity, Urine 1.025 1.005 - 1.030   Hgb urine dipstick 3+ (A) NEGATIVE   pH 7.0 5.0 - 8.0   Protein, ur 100 (A) NEGATIVE mg/dL   Nitrite NEGATIVE NEGATIVE   Leukocytes, UA TRACE (A) NEGATIVE   RBC / HPF TOO NUMEROUS TO COUNT 0 - 5 RBC/hpf   WBC, UA 6-30 0 - 5 WBC/hpf   Bacteria, UA FEW (A) NONE SEEN   Squamous Epithelial / LPF 6-30 (A) NONE SEEN   Mucous PRESENT    Amorphous Crystal PRESENT    Ca Oxalate Crys, UA PRESENT   Basic metabolic panel  Result Value Ref Range   Sodium 137 135 - 145 mmol/L   Potassium 4.0 3.5 - 5.1 mmol/L   Chloride 102 101 - 111 mmol/L   CO2 28 22 - 32 mmol/L   Glucose, Bld 91 65 - 99 mg/dL   BUN 22 (H) 6 - 20 mg/dL   Creatinine, Ser 0.52 0.44 - 1.00 mg/dL   Calcium 9.1 8.9 - 10.3 mg/dL   GFR calc non Af Amer >60 >60 mL/min   GFR calc Af Amer >60 >60 mL/min   Anion gap 7 5 - 15     MDM   1. Malaise and fatigue   2. Nausea   3. Hematuria   4. Incidental lung nodule     I have explained to the patient that her kidney function appears to be normal. Her urine concerns me because in comparison to her last few urine's she does not have that many WBCs present but she has a lot of blood in the urine. She's had at least 3 episodes of extensive hematuria in her urine. We will culture her urine and treat her for a UTI if positive but I've asked her to see  Dr. Ronnald Ramp about possible urological referral and cystoscopy/evaluation of hematuria. Also she has a nodule on the chest x-ray that could be part of her rib and would recommend repeat chest x-ray in 6 months as per radiologist's recommendation.  For the nausea will treat with Zofran. And will give a work note for today and tomorrow.        Note: This dictation was prepared with Dragon dictation  along with smaller phrase technology. Any transcriptional errors that result from this process are unintentional.  Frederich Cha, MD 06/27/15 1304

## 2015-06-27 NOTE — Discharge Instructions (Signed)
Nausea and Vomiting Nausea is a sick feeling that often comes before throwing up (vomiting). Vomiting is a reflex where stomach contents come out of your mouth. Vomiting can cause severe loss of body fluids (dehydration). Children and elderly adults can become dehydrated quickly, especially if they also have diarrhea. Nausea and vomiting are symptoms of a condition or disease. It is important to find the cause of your symptoms. CAUSES   Direct irritation of the stomach lining. This irritation can result from increased acid production (gastroesophageal reflux disease), infection, food poisoning, taking certain medicines (such as nonsteroidal anti-inflammatory drugs), alcohol use, or tobacco use.  Signals from the brain.These signals could be caused by a headache, heat exposure, an inner ear disturbance, increased pressure in the brain from injury, infection, a tumor, or a concussion, pain, emotional stimulus, or metabolic problems.  An obstruction in the gastrointestinal tract (bowel obstruction).  Illnesses such as diabetes, hepatitis, gallbladder problems, appendicitis, kidney problems, cancer, sepsis, atypical symptoms of a heart attack, or eating disorders.  Medical treatments such as chemotherapy and radiation.  Receiving medicine that makes you sleep (general anesthetic) during surgery. DIAGNOSIS Your caregiver may ask for tests to be done if the problems do not improve after a few days. Tests may also be done if symptoms are severe or if the reason for the nausea and vomiting is not clear. Tests may include:  Urine tests.  Blood tests.  Stool tests.  Cultures (to look for evidence of infection).  X-rays or other imaging studies. Test results can help your caregiver make decisions about treatment or the need for additional tests. TREATMENT You need to stay well hydrated. Drink frequently but in small amounts.You may wish to drink water, sports drinks, clear broth, or eat frozen  ice pops or gelatin dessert to help stay hydrated.When you eat, eating slowly may help prevent nausea.There are also some antinausea medicines that may help prevent nausea. HOME CARE INSTRUCTIONS   Take all medicine as directed by your caregiver.  If you do not have an appetite, do not force yourself to eat. However, you must continue to drink fluids.  If you have an appetite, eat a normal diet unless your caregiver tells you differently.  Eat a variety of complex carbohydrates (rice, wheat, potatoes, bread), lean meats, yogurt, fruits, and vegetables.  Avoid high-fat foods because they are more difficult to digest.  Drink enough water and fluids to keep your urine clear or pale yellow.  If you are dehydrated, ask your caregiver for specific rehydration instructions. Signs of dehydration may include:  Severe thirst.  Dry lips and mouth.  Dizziness.  Dark urine.  Decreasing urine frequency and amount.  Confusion.  Rapid breathing or pulse. SEEK IMMEDIATE MEDICAL CARE IF:   You have blood or brown flecks (like coffee grounds) in your vomit.  You have black or bloody stools.  You have a severe headache or stiff neck.  You are confused.  You have severe abdominal pain.  You have chest pain or trouble breathing.  You do not urinate at least once every 8 hours.  You develop cold or clammy skin.  You continue to vomit for longer than 24 to 48 hours.  You have a fever. MAKE SURE YOU:   Understand these instructions.  Will watch your condition.  Will get help right away if you are not doing well or get worse.   This information is not intended to replace advice given to you by your health care provider. Make sure  you discuss any questions you have with your health care provider.   Document Released: 04/26/2005 Document Revised: 07/19/2011 Document Reviewed: 09/23/2010 Elsevier Interactive Patient Education 2016 Else[employer]  Hematuria, Adult Hematuria is  blood in your urine. It can be caused by a bladder infection, kidney infection, prostate infection, kidney stone, or cancer of your urinary tract. Infections can usually be treated with medicine, and a kidney stone usually will pass through your urine. If neither of these is the cause of your hematuria, further workup to find out the reason may be needed. It is very important that you tell your health care provider about any blood you see in your urine, even if the blood stops without treatment or happens without causing pain. Blood in your urine that happens and then stops and then happens again can be a symptom of a very serious condition. Also, pain is not a symptom in the initial stages of many urinary cancers. HOME CARE INSTRUCTIONS   Drink lots of fluid, 3-4 quarts a day. If you have been diagnosed with an infection, cranberry juice is especially recommended, in addition to large amounts of water.  Avoid caffeine, tea, and carbonated beverages because they tend to irritate the bladder.  Avoid alcohol because it may irritate the prostate.  Take all medicines as directed by your health care provider.  If you were prescribed an antibiotic medicine, finish it all even if you start to feel better.  If you have been diagnosed with a kidney stone, follow your health care provider's instructions regarding straining your urine to catch the stone.  Empty your bladder often. Avoid holding urine for long periods of time.  After a bowel movement, women should cleanse front to back. Use each tissue only once.  Empty your bladder before and after sexual intercourse if you are a female. SEEK MEDICAL CARE IF:  You develop back pain.  You have a fever.  You have a feeling of sickness in your stomach (nausea) or vomiting.  Your symptoms are not better in 3 days. Return sooner if you are getting worse. SEEK IMMEDIATE MEDICAL CARE IF:   You develop severe vomiting and are unable to keep the medicine  down.  You develop severe back or abdominal pain despite taking your medicines.  You begin passing a large amount of blood or clots in your urine.  You feel extremely weak or faint, or you pass out. MAKE SURE YOU:   Understand these instructions.  Will watch your condition.  Will get help right away if you are not doing well or get worse.   This information is not intended to replace advice given to you by your health care provider. Make sure you discuss any questions you have with your health care provider.   Document Released: 04/26/2005 Document Revised: 05/17/2014 Document Reviewed: 12/25/2012 Elsevier Interactive Patient Education 2016 Else[employer] Inc.  Pulmonary Nodule  A pulmonary nodule is a small, round spot in your lung. It is usually found when pictures of your lungs are taken for other reasons. Most pulmonary nodules are not cancerous and do not cause symptoms. Tests will be done to make sure the nodule is not cancerous. Pulmonary nodules that are not cancerous usually do not require treatment. HOME CARE   Only take medicine as told by your doctor.  Follow up with your doctor as told. GET HELP IF:  You have trouble breathing when doing activities.  You feel sick or more tired than normal.  You do not feel  like eating.  You lose weight without trying to.  You have chills.  You have night sweats. GET HELP RIGHT AWAY IF:  You cannot catch your breath.  You start making whistling sounds when breathing (wheezing).  You have a cough that does not go away.  You cough up blood.  You are dizzy or feel like you are going to pass out.  You have sudden chest pain.  You have a fever or lasting symptoms for more than 2-3 days.  You have a fever and your symptoms suddenly get worse. MAKE SURE YOU:  Understand these instructions.  Will watch your condition.  Will get help right away if you are not doing well or get worse.   This information is not intended  to replace advice given to you by your health care provider. Make sure you discuss any questions you have with your health care provider.   Document Released: 05/29/2010 Document Revised: 09/10/2014 Document Reviewed: 10/16/2012 Elsevier Interactive Patient Education 2016 Elsevier Inc.  Weakness Weakness is a lack of strength. You may feel weak all over your body or just in one part of your body. Weakness can be serious. In some cases, you may need more medical tests. HOME [place] a well-balanced diet.  Try to exercise every day.  Only take medicines as told by your doctor. GET HELP RIGHT AWAY IF:   You cannot do your normal daily activities.  You cannot walk up and down stairs, or you feel very tired when you do so.  You have shortness of breath or chest pain.  You have trouble moving parts of your body.  You have weakness in only one body part or on only one side of the body.  You have a fever.  You have trouble speaking or swallowing.  You cannot control when you pee (urinate) or poop (bowel movement).  You have black or bloody throw up (vomit) or poop.  Your weakness gets worse or spreads to other body parts.  You have new aches or pains. MAKE SURE YOU:   Understand these instructions.  Will watch your condition.  Will get help right away if you are not doing well or get worse.   This information is not intended to replace advice given to you by your health care provider. Make sure you discuss any questions you have with your health care provider.   Document Released: 04/08/2008 Document Revised: 10/26/2011 Document Reviewed: 06/25/2011 Elsevier Interactive Patient Education [employer].

## 2015-06-27 NOTE — ED Notes (Signed)
Patient has had symptoms of nausea starting this past Monday and vomited yesterday AM once.

## 2015-06-29 LAB — URINE CULTURE: Special Requests: NORMAL

## 2015-08-02 ENCOUNTER — Ambulatory Visit: Payer: Medicare Other

## 2015-08-02 ENCOUNTER — Encounter: Payer: Self-pay | Admitting: Gynecology

## 2015-08-02 ENCOUNTER — Ambulatory Visit
Admission: EM | Admit: 2015-08-02 | Discharge: 2015-08-02 | Disposition: A | Discharge status: Home / Self Care | Payer: Medicare Other | Attending: Family Medicine | Admitting: Family Medicine

## 2015-08-02 DIAGNOSIS — Z823 Family history of stroke: Secondary | ICD-10-CM | POA: Insufficient documentation

## 2015-08-02 DIAGNOSIS — Z79899 Other long term (current) drug therapy: Secondary | ICD-10-CM | POA: Diagnosis not present

## 2015-08-02 DIAGNOSIS — Z800 Family history of malignant neoplasm of digestive organs: Secondary | ICD-10-CM | POA: Diagnosis not present

## 2015-08-02 DIAGNOSIS — I10 Essential (primary) hypertension: Secondary | ICD-10-CM | POA: Diagnosis not present

## 2015-08-02 DIAGNOSIS — R05 Cough: Secondary | ICD-10-CM | POA: Diagnosis not present

## 2015-08-02 DIAGNOSIS — E785 Hyperlipidemia, unspecified: Secondary | ICD-10-CM | POA: Diagnosis not present

## 2015-08-02 DIAGNOSIS — Z87891 Personal history of nicotine dependence: Secondary | ICD-10-CM | POA: Insufficient documentation

## 2015-08-02 DIAGNOSIS — J189 Pneumonia, unspecified organism: Secondary | ICD-10-CM | POA: Insufficient documentation

## 2015-08-02 DIAGNOSIS — R102 Pelvic and perineal pain: Secondary | ICD-10-CM | POA: Diagnosis present

## 2015-08-02 LAB — URINALYSIS COMPLETE WITH MICROSCOPIC (ARMC ONLY)
Glucose, UA: NEGATIVE mg/dL
KETONES UR: NEGATIVE mg/dL
NITRITE: NEGATIVE
PH: 5.5 (ref 5.0–8.0)
Protein, ur: >300 mg/dL — AB
Specific Gravity, Urine: 1.025 (ref 1.005–1.030)

## 2015-08-02 MED ORDER — LEVOFLOXACIN 500 MG PO TABS: 500.0000 mg | ORAL_TABLET | Freq: Every day | ORAL | Status: DC

## 2015-08-02 NOTE — ED Provider Notes (Signed)
CSN: [id number]     Arrival date & time 08/02/15  1145 History   First MD Initiated Contact with Patient 08/02/15 1252     Chief Complaint  Patient presents with  . Pelvic Pain  . Cough   (Consider location/radiation/quality/duration/timing/severity/associated sxs/prior Treatment) HPI  80 year old female with a past history of CAD, UTI, tobacco abuse presents with complaints of not feeling well.  Patient states that for the past week she's not felt well. She's had some congestion and cough. She's also had some dizziness. Additionally, patient reports intermittent lower abdominal pain has been ongoing. No reports of dysuria, urgency, frequency. She reports some dyspnea with exertion. No known exacerbating or relieving factors. She has had recent sick contacts at home.   Past Medical History  Diagnosis Date  . Hypertension   . Hyperlipemia   . Heart block     left  . Adnexal mass   . Bleeding ulcer    Past Surgical History  Procedure Laterality Date  . Cardiac surgery      cardiac cath  . Abdominal hysterectomy    . Stomach surgery    . Hip surgery      left   Family History  Problem Relation Age of Onset  . Stomach cancer Mother   . CVA Father    Social History  Substance Use Topics  . Smoking status: Former Smoker -- 1.00 packs/day for 40 years    Quit date: 06/13/1998  . Smokeless tobacco: None  . Alcohol Use: No   OB History    No data available     Review of Systems  Constitutional: Positive for fatigue.  Respiratory: Positive for cough and shortness of breath.   Gastrointestinal: Positive for abdominal pain.   Allergies  Review of patient's allergies indicates no known allergies.  Home Medications   Prior to Admission medications   Medication Sig Start Date End Date Taking? Authorizing Provider  acetaminophen (TYLENOL) 325 MG tablet Take 650 mg by mouth every 6 (six) hours as needed for mild pain or headache.    Yes Historical Provider, MD  albuterol  (PROVENTIL HFA;VENTOLIN HFA) 108 (90 BASE) MCG/ACT inhaler Inhale 2 puffs into the lungs every 6 (six) hours as needed for wheezing or shortness of breath. 01/28/15  Yes Aldean Jewett, MD  cefUROXime (CEFTIN) 500 MG tablet Take 1 tablet (500 mg total) by mouth 2 (two) times daily. 05/08/15  Yes Frederich Cha, MD  clopidogrel (PLAVIX) 75 MG tablet Take 1 tablet (75 mg total) by mouth daily. 02/24/15  Yes [name], MD  furosemide (LASIX) 20 MG tablet Take 1 tablet (20 mg total) by mouth daily. As needed for leg swelling. Take potassium when taking furosemide. Patient taking differently: Take 20 mg by mouth daily. As needed for leg swelling. Take potassium when taking furosemide.- Dr Sheppard Coil 12/09/14  Yes [name], MD  gabapentin (NEURONTIN) 100 MG capsule Take 1 capsule (100 mg total) by mouth 3 (three) times daily. 12/09/14  Yes [name], MD  metoprolol (LOPRESSOR) 100 MG tablet Take 1 tablet (100 mg total) by mouth 2 (two) times daily. 02/24/15  Yes [name], MD  ondansetron (ZOFRAN ODT) 8 MG disintegrating tablet Take 1 tablet (8 mg total) by mouth every 8 (eight) hours as needed for nausea or vomiting. 05/08/15  Yes Frederich Cha, MD  ondansetron (ZOFRAN ODT) 8 MG disintegrating tablet Take 1 tablet (8 mg total) by mouth every 8 (eight) hours as needed for nausea  or vomiting. 06/27/15  Yes Frederich Cha, MD  potassium chloride (K-DUR) 10 MEQ tablet Take 1 tablet (10 mEq total) by mouth 2 (two) times a week. Take with doses of furosemide, to keep potassium from getting too low. 12/09/14  Yes [name], MD  levofloxacin (LEVAQUIN) 500 MG tablet Take 1 tablet (500 mg total) by mouth daily. 08/02/15   Coral Spikes, DO   Meds Ordered and Administered this Visit  Medications - No data to display  BP 139/73 mmHg  Pulse 68  Temp(Src) 98.1 F (36.7 C) (Oral)  Resp 16  Ht 5\' 8"  (1.727 m)  Wt 140 lb (63.504 kg)  BMI 21.29 kg/m2  SpO2 100% No data found.  Physical Exam   Constitutional:  Chronically ill-appearing female in no acute distress.  HENT:  Head: Normocephalic and atraumatic.  Cardiovascular: Normal rate and regular rhythm.   Pulmonary/Chest: Effort normal and breath sounds normal. She has no wheezes. She has no rales.  Abdominal: Soft. She exhibits no distension. There is no tenderness. There is no rebound and no guarding.  Neurological: She is alert.  Vitals reviewed.   ED Course  Procedures (including critical care time)  Labs Review Labs Reviewed  URINALYSIS COMPLETEWITH MICROSCOPIC ([facility] ONLY) - Abnormal; Notable for the following:    APPearance CLOUDY (*)    Bilirubin Urine 1+ (*)    Hgb urine dipstick 3+ (*)    Protein, ur >300 (*)    Leukocytes, UA TRACE (*)    Bacteria, UA FEW (*)    Squamous Epithelial / LPF 0-5 (*)    All other components within normal limits  URINE CULTURE   Imaging Review Dg Chest 2 View  08/02/2015  CLINICAL DATA:  Cough and congestion.  Dizziness. EXAM: CHEST  2 VIEW COMPARISON:  06/27/2015. FINDINGS: Nodular density noted previously in the RIGHT upper lobe is not visualized. Asymmetric opacity at the LEFT base not present previously, could represent early infiltrate. No effusion or pneumothorax. Surgical clips surround the GE junction. Mild cardiac enlargement. Thoracic atherosclerosis. No pneumothorax or osseous findings. IMPRESSION: Opacity at the LEFT costophrenic angle was not present previously. Early pneumonia not excluded. Electronically Signed   By: Staci Righter M.D.   On: 08/02/2015 13:52    MDM   1. CAP (community acquired pneumonia)    80 year old female presents with vague complaints but has been sick over the past week. Chest x-ray was obtained and revealed opacity in the left lower lobe concerning for pneumonia. Additionally, patient has persistent hematuria based on chart review. She will follow-up with her primary regarding this. Treating for suspected CAP with Levaquin (I checked  creatinine clearance prior to prescribing).   Coral Spikes, DO 08/02/15 1414

## 2015-08-02 NOTE — Discharge Instructions (Signed)
Take the medication as prescribed.  Please follow-up with your primary physician early next week. She will set up for you to see a urologist given your persistent hematuria (blood in the urine).  Take care  Dr. Lacinda Axon    Community-Acquired Pneumonia, Adult Pneumonia is an infection of the lungs. One type of pneumonia can happen while a person is in a hospital. A different type can happen when a person is not in a hospital (community-acquired pneumonia). It is easy for this kind to spread from person to person. It can spread to you if you breathe near an infected person who coughs or sneezes. Some symptoms include:  A dry cough.  A wet (productive) cough.  Fever.  Sweating.  Chest pain. HOME CARE  Take over-the-counter and prescription medicines only as told by your doctor.  Only take cough medicine if you are losing sleep.  If you were prescribed an antibiotic medicine, take it as told by your doctor. Do not stop taking the antibiotic even if you start to feel better.  Sleep with your head and neck raised (elevated). You can do this by putting a few pillows under your head, or you can sleep in a recliner.  Do not use tobacco products. These include cigarettes, chewing tobacco, and e-cigarettes. If you need help quitting, ask your doctor.  Drink enough water to keep your pee (urine) clear or pale yellow. A shot (vaccine) can help prevent pneumonia. Shots are often suggested for:  People older than 78 years of age.  People older than 78 years of age:  Who are having cancer treatment.  Who have long-term (chronic) lung disease.  Who have problems with their body's defense system (immune system). You may also prevent pneumonia if you take these actions:  Get the flu (influenza) shot every year.  Go to the dentist as often as told.  Wash your hands often. If soap and water are not available, use hand sanitizer. GET HELP IF:  You have a fever.  You lose sleep because  your cough medicine does not help. GET HELP RIGHT AWAY IF:  You are short of breath and it gets worse.  You have more chest pain.  Your sickness gets worse. This is very serious if:  You are an older adult.  Your body's defense system is weak.  You cough up blood.   This information is not intended to replace advice given to you by your health care provider. Make sure you discuss any questions you have with your health care provider.   Document Released: 10/13/2007 Document Revised: 01/15/2015 Document Reviewed: 08/21/2014 Elsevier Interactive Patient Education [employer].

## 2015-08-02 NOTE — ED Notes (Signed)
Patient c/o abdomen pain / cough / chest congestion . Patient said felt dizzy at work x yesterday. Per patient not eating much. Per patient would like a note for work for today and tomorrow.

## 2015-08-04 ENCOUNTER — Encounter: Payer: Self-pay | Admitting: Family Medicine

## 2015-08-04 ENCOUNTER — Ambulatory Visit (INDEPENDENT_AMBULATORY_CARE_PROVIDER_SITE_OTHER): Payer: Medicare Other | Admitting: Family Medicine

## 2015-08-04 VITALS — BP 138/80 | HR 78 | Temp 98.5°F | Ht 68.0 in | Wt 152.0 lb

## 2015-08-04 DIAGNOSIS — R938 Abnormal findings on diagnostic imaging of other specified body structures: Secondary | ICD-10-CM

## 2015-08-04 DIAGNOSIS — J189 Pneumonia, unspecified organism: Secondary | ICD-10-CM

## 2015-08-04 DIAGNOSIS — R109 Unspecified abdominal pain: Secondary | ICD-10-CM | POA: Diagnosis not present

## 2015-08-04 DIAGNOSIS — R9389 Abnormal findings on diagnostic imaging of other specified body structures: Secondary | ICD-10-CM

## 2015-08-04 DIAGNOSIS — N3021 Other chronic cystitis with hematuria: Secondary | ICD-10-CM

## 2015-08-04 LAB — POCT URINALYSIS DIPSTICK
Bilirubin, UA: NEGATIVE
GLUCOSE UA: NEGATIVE
Ketones, UA: NEGATIVE
Leukocytes, UA: NEGATIVE
Nitrite, UA: NEGATIVE
PH UA: 7
Protein, UA: NEGATIVE
SPEC GRAV UA: 1.015
UROBILINOGEN UA: 0.2

## 2015-08-04 LAB — URINE CULTURE

## 2015-08-04 NOTE — Progress Notes (Signed)
Name: Melissa Cochran   MRN: [id number]    DOB: 05/18/1935   Date:08/04/2015       Progress Note  Subjective  Chief Complaint  Chief Complaint  Patient presents with  . Follow-up    urgent care visit on Sat  . Hematuria    Hematuria This is a chronic problem. The current episode started more than 1 month ago. The problem has been gradually improving since onset. She describes the hematuria as microscopic hematuria. She describes her urine color as clear. Irritative symptoms do not include frequency or urgency. Obstructive symptoms do not include dribbling, incomplete emptying, an intermittent stream, a slower stream, straining or a weak stream. Pertinent negatives include no abdominal pain, chills, dysuria, fever, hesitancy, inability to urinate or nausea. (Past ct cholelithiasis/ bilateral adrenal cysts/)  Pelvic Pain The patient's primary symptoms include pelvic pain. This is a new problem. Pertinent negatives include no abdominal pain, back pain, chills, constipation, diarrhea, dysuria, fever, frequency, headaches, hematuria, joint pain, nausea, rash, sore throat or urgency. There is no history of an abdominal surgery, endometriosis or a gynecological surgery. (Past ct cholelithiasis/ bilateral adrenal cysts/)    No problem-specific assessment & plan notes found for this encounter.   Past Medical History  Diagnosis Date  . Hypertension   . Hyperlipemia   . Heart block     left  . Adnexal mass   . Bleeding ulcer     Past Surgical History  Procedure Laterality Date  . Cardiac surgery      cardiac cath  . Abdominal hysterectomy    . Stomach surgery    . Hip surgery      left    Family History  Problem Relation Age of Onset  . Stomach cancer Mother   . CVA Father     Social History   Social History  . Marital Status: Widowed    Spouse Name: N/A  . Number of Children: N/A  . Years of Education: N/A   Occupational History  . Not on file.   Social History  Main Topics  . Smoking status: Former Smoker -- 1.00 packs/day for 40 years    Quit date: 06/13/1998  . Smokeless tobacco: Not on file  . Alcohol Use: No  . Drug Use: No  . Sexual Activity: Not Currently   Other Topics Concern  . Not on file   Social History Narrative    No Known Allergies   Review of Systems  Constitutional: Negative for fever, chills, weight loss and malaise/fatigue.  HENT: Negative for ear discharge, ear pain and sore throat.   Eyes: Negative for blurred vision.  Respiratory: Negative for cough, sputum production, shortness of breath and wheezing.   Cardiovascular: Negative for chest pain, palpitations and leg swelling.  Gastrointestinal: Negative for heartburn, nausea, abdominal pain, diarrhea, constipation, blood in stool and melena.  Genitourinary: Positive for pelvic pain. Negative for dysuria, hesitancy, urgency, frequency, hematuria and incomplete emptying.  Musculoskeletal: Negative for myalgias, back pain, joint pain and neck pain.  Skin: Negative for rash.  Neurological: Negative for dizziness, tingling, sensory change, focal weakness and headaches.  Endo/Heme/Allergies: Negative for environmental allergies and polydipsia. Does not bruise/bleed easily.  Psychiatric/Behavioral: Negative for depression and suicidal ideas. The patient is not nervous/anxious and does not have insomnia.      Objective  Filed Vitals:   08/04/15 1046  BP: 138/80  Pulse: 78  Temp: 98.5 F (36.9 C)  Height: 5\' 8"  (1.727 m)  Weight: 152 lb (  68.947 kg)  SpO2: 99%    Physical Exam  Constitutional: She is well-developed, well-nourished, and in no distress. No distress.  HENT:  Head: Normocephalic and atraumatic.  Right Ear: External ear normal.  Left Ear: External ear normal.  Nose: Nose normal.  Mouth/Throat: Oropharynx is clear and moist.  Eyes: Conjunctivae and EOM are normal. Pupils are equal, round, and reactive to light. Right eye exhibits no discharge. Left  eye exhibits no discharge.  Neck: Normal range of motion. Neck supple. No JVD present. No thyromegaly present.  Cardiovascular: Normal rate, regular rhythm, normal heart sounds and intact distal pulses.  Exam reveals no gallop and no friction rub.   No murmur heard. Pulmonary/Chest: Effort normal and breath sounds normal.  Abdominal: Soft. Bowel sounds are normal. She exhibits no mass. There is no tenderness. There is no guarding.  Musculoskeletal: Normal range of motion. She exhibits no edema.  Lymphadenopathy:    She has no cervical adenopathy.  Neurological: She is alert. She has normal reflexes.  Skin: Skin is warm and dry. She is not diaphoretic.  Psychiatric: Mood and affect normal.  Nursing note and vitals reviewed.     Assessment & Plan  Problem List Items Addressed This Visit    None    Visit Diagnoses    CAP (community acquired pneumonia)    -  Primary    Other chronic cystitis with hematuria        Relevant Orders    Ambulatory referral to Urology    POCT Urinalysis Dipstick (Completed)    Abdominal pain, recurrent        Abnormal CT scan             Dr. Otilio Miu [facility]  08/04/2015

## 2015-08-26 ENCOUNTER — Encounter: Payer: Self-pay | Admitting: Urology

## 2015-08-26 ENCOUNTER — Ambulatory Visit (INDEPENDENT_AMBULATORY_CARE_PROVIDER_SITE_OTHER): Payer: Medicare Other | Admitting: Urology

## 2015-08-26 VITALS — BP 150/79 | HR 69 | Ht 68.0 in | Wt 154.4 lb

## 2015-08-26 DIAGNOSIS — N3001 Acute cystitis with hematuria: Secondary | ICD-10-CM

## 2015-08-26 DIAGNOSIS — N200 Calculus of kidney: Secondary | ICD-10-CM | POA: Diagnosis not present

## 2015-08-26 DIAGNOSIS — N3941 Urge incontinence: Secondary | ICD-10-CM

## 2015-08-26 DIAGNOSIS — R3129 Other microscopic hematuria: Secondary | ICD-10-CM | POA: Insufficient documentation

## 2015-08-26 LAB — URINALYSIS, COMPLETE
BILIRUBIN UA: NEGATIVE
Glucose, UA: NEGATIVE
KETONES UA: NEGATIVE
Nitrite, UA: NEGATIVE
SPEC GRAV UA: 1.01 (ref 1.005–1.030)
UUROB: 0.2 mg/dL (ref 0.2–1.0)
pH, UA: 7 (ref 5.0–7.5)

## 2015-08-26 LAB — MICROSCOPIC EXAMINATION: Bacteria, UA: NONE SEEN

## 2015-08-26 LAB — BLADDER SCAN AMB NON-IMAGING: Scan Result: 178

## 2015-08-26 NOTE — Progress Notes (Signed)
Bladder Scan Patient  void: 178 ml Performed By: Larna Daughters

## 2015-08-26 NOTE — Progress Notes (Signed)
08/26/2015 11:14 AM   Melissa Cochran Jan 21, 1936 [id number]  Referring provider: [name], MD [address] [place], [place] [address]  Chief Complaint  Patient presents with  . New Patient (Initial Visit)    chronic cystitis w/hematuria    HPI:  1 - Microscopic Hematuria - blood on UA noted at PCP x several. No gross episodes. Former 40PY smoker now quite. H/o nephrolithiasis as well.  2 - Urge Urinary Incontinence - slowly progressive but still modest bother with occasional urge leakage. Manages with pads. Pelvic 2017 w/o prolapse. PVR about "149mL" (modest elevation). FOrmer smoker. She is on Lasix for edema and admittidly "drinks al the time", mostly caffiniated drinks.   3 - Nephrolithiasis - h/o medically passed stones years x several. No colic or GU imaging in years.  PMH sig for benign hyst, gastric ulcer surgery, CHF/Lasix.   Today "Melissa Cochran" is seen as new pateint for above.  PMH: Past Medical History  Diagnosis Date  . Hypertension   . Hyperlipemia   . Heart block     left  . Adnexal mass   . Bleeding ulcer     Surgical History: Past Surgical History  Procedure Laterality Date  . Cardiac surgery      cardiac cath  . Abdominal hysterectomy    . Stomach surgery    . Hip surgery      left    Home Medications:    Medication List       This list is accurate as of: 08/26/15 11:14 AM.  Always use your most recent med list.               acetaminophen 325 MG tablet  Commonly known as:  TYLENOL  Take 650 mg by mouth every 6 (six) hours as needed for mild pain or headache.     albuterol 108 (90 Base) MCG/ACT inhaler  Commonly known as:  PROVENTIL HFA;VENTOLIN HFA  Inhale 2 puffs into the lungs every 6 (six) hours as needed for wheezing or shortness of breath.     clopidogrel 75 MG tablet  Commonly known as:  PLAVIX  Take 1 tablet (75 mg total) by mouth daily.     furosemide 20 MG tablet  Commonly known as:  LASIX  Take 1  tablet (20 mg total) by mouth daily. As needed for leg swelling. Take potassium when taking furosemide.     gabapentin 100 MG capsule  Commonly known as:  NEURONTIN  Take 1 capsule (100 mg total) by mouth 3 (three) times daily.     levofloxacin 500 MG tablet  Commonly known as:  LEVAQUIN  Take 1 tablet (500 mg total) by mouth daily.     metoprolol 100 MG tablet  Commonly known as:  LOPRESSOR  Take 1 tablet (100 mg total) by mouth 2 (two) times daily.     ondansetron 8 MG disintegrating tablet  Commonly known as:  ZOFRAN ODT  Take 1 tablet (8 mg total) by mouth every 8 (eight) hours as needed for nausea or vomiting.     potassium chloride 10 MEQ tablet  Commonly known as:  K-DUR  Take 1 tablet (10 mEq total) by mouth 2 (two) times a week. Take with doses of furosemide, to keep potassium from getting too low.        Allergies: No Known Allergies  Family History: Family History  Problem Relation Age of Onset  . Stomach cancer Mother   . CVA Father   .  Bladder Cancer Neg Hx   . Kidney cancer Neg Hx     Social History:  reports that she quit smoking about 17 years ago. She does not have any smokeless tobacco history on file. She reports that she does not drink alcohol or use illicit drugs.  ROS: UROLOGY Frequent Urination?: Yes Hard to postpone urination?: Yes Burning/pain with urination?: No Get up at night to urinate?: Yes Leakage of urine?: Yes Urine stream starts and stops?: Yes Trouble starting stream?: No Do you have to strain to urinate?: No Blood in urine?: No Urinary tract infection?: Yes Sexually transmitted disease?: No Injury to kidneys or bladder?: No Painful intercourse?: No Weak stream?: No Currently pregnant?: No Vaginal bleeding?: No Last menstrual period?: n  Gastrointestinal Nausea?: No Vomiting?: No Indigestion/heartburn?: No Diarrhea?: No Constipation?: No  Constitutional Fever: No Night sweats?: No Weight loss?: No Fatigue?:  No  Skin Skin rash/lesions?: No Itching?: No  Eyes Blurred vision?: No Double vision?: No  Ears/Nose/Throat Sore throat?: No Sinus problems?: No  Hematologic/Lymphatic Swollen glands?: No Easy bruising?: Yes  Cardiovascular Leg swelling?: No Chest pain?: Yes  Respiratory Cough?: Yes Shortness of breath?: Yes  Endocrine Excessive thirst?: No  Musculoskeletal Back pain?: No Joint pain?: No  Neurological Headaches?: No Dizziness?: No  Psychologic Depression?: No Anxiety?: No  Physical Exam: BP 150/79 mmHg  Pulse 69  Ht 5\' 8"  (1.727 m)  Wt 154 lb 6.4 oz (70.035 kg)  BMI 23.48 kg/m2  Constitutional:  Alert and oriented, No acute distress. HEENT: [place] AT, moist mucus membranes.  Trachea midline, no masses. Cardiovascular: No clubbing, cyanosis, or edema. Respiratory: Normal respiratory effort, no increased work of breathing. GI: Abdomen is soft, nontender, nondistended, no abdominal masses. Prior pfannenstiel scar. Upper midline scar. NO hernias.  GU: No CVA tenderness. Vaginal / introital atrophy age appropriate. No prolapse or palpable mass.  Skin: No rashes, bruises or suspicious lesions. Lymph: No cervical or inguinal adenopathy. Neurologic: Grossly intact, no focal deficits, moving all 4 extremities. Psychiatric: Normal mood and affect.  Laboratory Data: Lab Results  Component Value Date   WBC 7.8 06/27/2015   HGB 13.6 06/27/2015   HCT 42.5 06/27/2015   MCV 88.6 06/27/2015   PLT 274 06/27/2015    Lab Results  Component Value Date   CREATININE 0.52 06/27/2015    No results found for: PSA  No results found for: TESTOSTERONE  No results found for: HGBA1C  Urinalysis    Component Value Date/Time   COLORURINE YELLOW 08/02/2015 1217   COLORURINE YELLOW 01/23/2014 1047   APPEARANCEUR CLOUDY* 08/02/2015 1217   APPEARANCEUR HAZY 01/23/2014 1047   LABSPEC 1.025 08/02/2015 1217   LABSPEC 1.020 01/23/2014 1047   PHURINE 5.5 08/02/2015 1217    PHURINE 6.0 01/23/2014 1047   GLUCOSEU NEGATIVE 08/02/2015 1217   GLUCOSEU NEGATIVE 01/23/2014 1047   HGBUR 3+* 08/02/2015 1217   HGBUR SMALL 01/23/2014 1047   BILIRUBINUR negative 08/04/2015 1140   BILIRUBINUR 1+* 08/02/2015 1217   BILIRUBINUR NEGATIVE 01/23/2014 [place] 08/02/2015 1217   KETONESUR NEGATIVE 01/23/2014 1047   PROTEINUR negative 08/04/2015 1140   PROTEINUR >300* 08/02/2015 1217   PROTEINUR 30 mg/dL 01/23/2014 1047   UROBILINOGEN 0.2 08/04/2015 1140   NITRITE negative 08/04/2015 1140   NITRITE NEGATIVE 08/02/2015 1217   NITRITE NEGATIVE 01/23/2014 1047   LEUKOCYTESUR Negative 08/04/2015 1140   LEUKOCYTESUR TRACE 01/23/2014 1047      Assessment & Plan:    1 - Microscopic Hematuria - discussed DDX  and eval and she agrees to proceed. Very important in setting of new irritative voiding symptoms and former smoker to rule out bladder cancer.   BMP today, RTC for CT and cysto.   2 - Urge Urinary Incontinence - eval as per above to r/o malignancy or low ureteral stone. If normal, consider low-dose anticholinergic. I feel would be safe as PVR only modestly elevated and no large cystocele or prior reteniton.   3 - Nephrolithiasis - eval as per above to r/o current stones.   4 - RTC for CT and cysto    Return in about 3 weeks (around 09/16/2015).  Alexis Frock, [facility] [address] [place], [place] [address] [phone]

## 2015-08-27 LAB — BASIC METABOLIC PANEL
BUN/Creatinine Ratio: 25 (ref 12–28)
BUN: 16 mg/dL (ref 8–27)
CALCIUM: 9.9 mg/dL (ref 8.7–10.3)
CHLORIDE: 97 mmol/L (ref 96–106)
CO2: 25 mmol/L (ref 18–29)
Creatinine, Ser: 0.65 mg/dL (ref 0.57–1.00)
GFR calc non Af Amer: 85 mL/min/{1.73_m2} (ref >59)
GFR, EST AFRICAN AMERICAN: 98 mL/min/{1.73_m2} (ref >59)
GLUCOSE: 79 mg/dL (ref 65–99)
Potassium: 5.2 mmol/L (ref 3.5–5.2)
Sodium: 141 mmol/L (ref 134–144)

## 2015-09-16 ENCOUNTER — Ambulatory Visit
Admission: RE | Admit: 2015-09-16 | Discharge: 2015-09-16 | Disposition: A | Discharge status: Home / Self Care | Payer: Medicare Other | Source: Ambulatory Visit | Attending: Urology | Admitting: Urology

## 2015-09-16 DIAGNOSIS — Z96642 Presence of left artificial hip joint: Secondary | ICD-10-CM | POA: Insufficient documentation

## 2015-09-16 DIAGNOSIS — R918 Other nonspecific abnormal finding of lung field: Secondary | ICD-10-CM | POA: Insufficient documentation

## 2015-09-16 DIAGNOSIS — N200 Calculus of kidney: Secondary | ICD-10-CM | POA: Diagnosis not present

## 2015-09-16 DIAGNOSIS — R938 Abnormal findings on diagnostic imaging of other specified body structures: Secondary | ICD-10-CM | POA: Insufficient documentation

## 2015-09-16 DIAGNOSIS — R3129 Other microscopic hematuria: Secondary | ICD-10-CM | POA: Insufficient documentation

## 2015-09-16 DIAGNOSIS — N329 Bladder disorder, unspecified: Secondary | ICD-10-CM | POA: Insufficient documentation

## 2015-09-16 HISTORY — DX: Heart failure, unspecified: I50.9

## 2015-09-16 MED ORDER — IOPAMIDOL (ISOVUE-300) INJECTION 61%
125.0000 mL | Freq: Once | INTRAVENOUS | Status: AC | PRN
  Administered 2015-09-16: 125 mL via INTRAVENOUS

## 2015-09-29 ENCOUNTER — Ambulatory Visit (INDEPENDENT_AMBULATORY_CARE_PROVIDER_SITE_OTHER): Payer: Medicare Other | Admitting: Urology

## 2015-09-29 ENCOUNTER — Encounter: Payer: Self-pay | Admitting: Urology

## 2015-09-29 VITALS — BP 161/77 | HR 65 | Ht 68.0 in | Wt 157.0 lb

## 2015-09-29 DIAGNOSIS — N3941 Urge incontinence: Secondary | ICD-10-CM | POA: Diagnosis not present

## 2015-09-29 DIAGNOSIS — N200 Calculus of kidney: Secondary | ICD-10-CM | POA: Diagnosis not present

## 2015-09-29 DIAGNOSIS — C670 Malignant neoplasm of trigone of bladder: Secondary | ICD-10-CM | POA: Diagnosis not present

## 2015-09-29 DIAGNOSIS — R3129 Other microscopic hematuria: Secondary | ICD-10-CM | POA: Diagnosis not present

## 2015-09-29 DIAGNOSIS — J984 Other disorders of lung: Secondary | ICD-10-CM

## 2015-09-29 DIAGNOSIS — R918 Other nonspecific abnormal finding of lung field: Secondary | ICD-10-CM

## 2015-09-29 LAB — URINALYSIS, COMPLETE
Bilirubin, UA: NEGATIVE
Glucose, UA: NEGATIVE
Ketones, UA: NEGATIVE
Nitrite, UA: NEGATIVE
PH UA: 6.5 (ref 5.0–7.5)
Specific Gravity, UA: 1.025 (ref 1.005–1.030)
Urobilinogen, Ur: 0.2 mg/dL (ref 0.2–1.0)

## 2015-09-29 LAB — MICROSCOPIC EXAMINATION
BACTERIA UA: NONE SEEN
RBC, UA: >30 /hpf — AB (ref 0–?)

## 2015-09-29 MED ORDER — LIDOCAINE HCL 2 % EX GEL
1.0000 "application " | Freq: Once | CUTANEOUS | Status: AC
  Administered 2015-09-29: 1 via URETHRAL

## 2015-09-29 MED ORDER — CIPROFLOXACIN HCL 500 MG PO TABS
500.0000 mg | ORAL_TABLET | Freq: Once | ORAL | Status: AC
  Administered 2015-09-29: 500 mg via ORAL

## 2015-09-29 NOTE — Progress Notes (Signed)
08/26/2015 11:14 AM   Melissa Cochran [birth year] [id number]  Referring provider: [name], MD [address] [place], [place] [address]  Chief Complaint  Patient presents with  . New Patient (Initial Visit)    chronic cystitis w/hematuria    HPI:  1 - Microscopic Hematuria - blood on UA noted at PCP x several. No gross episodes. Former 40PY smoker now quite. H/o nephrolithiasis as well. Bladder cancer as per below.   2 - Urge Urinary Incontinence - slowly progressive but still modest bother with occasional urge leakage. Manages with pads. Pelvic 2017 w/o prolapse. PVR about "145mL" (modest elevation). FOrmer smoker. She is on Lasix for edema and admittidly "drinks al the time", mostly caffiniated drinks.   3 - Nephrolithiasis - h/o medically passed stones previousliy. CT 09/2015 with 38mm LLP w/o hydro.  4 - Bladder Cancer - Rt trigone approx 5cm papillary mass by CT and cysto 09/2015 on eval hematuria and irritative voiding. No hydro. No pelvic adenopathy or loss of perivesical fat planes.  5 - Left Lower Lobe Lung Nodule - 1.6cm LLL nodule incidental on CT abd 09/2015. Prior 40PY smoker.   PMH sig for benign hyst, gastric ulcer surgery, CHF/Lasix.   Today "Melissa Cochran" is seen for cysto and f/u above with interval Ct that unfortunatley shows bladder cancer and left lower lung mass.   PMH: Past Medical History  Diagnosis Date  . Hypertension   . Hyperlipemia   . Heart block     left  . Adnexal mass   . Bleeding ulcer     Surgical History: Past Surgical History  Procedure Laterality Date  . Cardiac surgery      cardiac cath  . Abdominal hysterectomy    . Stomach surgery    . Hip surgery      left    Home Medications:    Medication List       This list is accurate as of: 08/26/15 11:14 AM.  Always use your most recent med list.               acetaminophen 325 MG tablet  Commonly known as:  TYLENOL  Take 650 mg by mouth every 6 (six) hours  as needed for mild pain or headache.     albuterol 108 (90 Base) MCG/ACT inhaler  Commonly known as:  PROVENTIL HFA;VENTOLIN HFA  Inhale 2 puffs into the lungs every 6 (six) hours as needed for wheezing or shortness of breath.     clopidogrel 75 MG tablet  Commonly known as:  PLAVIX  Take 1 tablet (75 mg total) by mouth daily.     furosemide 20 MG tablet  Commonly known as:  LASIX  Take 1 tablet (20 mg total) by mouth daily. As needed for leg swelling. Take potassium when taking furosemide.     gabapentin 100 MG capsule  Commonly known as:  NEURONTIN  Take 1 capsule (100 mg total) by mouth 3 (three) times daily.     levofloxacin 500 MG tablet  Commonly known as:  LEVAQUIN  Take 1 tablet (500 mg total) by mouth daily.     metoprolol 100 MG tablet  Commonly known as:  LOPRESSOR  Take 1 tablet (100 mg total) by mouth 2 (two) times daily.     ondansetron 8 MG disintegrating tablet  Commonly known as:  ZOFRAN ODT  Take 1 tablet (8 mg total) by mouth every 8 (eight) hours as needed for nausea or vomiting.  potassium chloride 10 MEQ tablet  Commonly known as:  K-DUR  Take 1 tablet (10 mEq total) by mouth 2 (two) times a week. Take with doses of furosemide, to keep potassium from getting too low.        Allergies: No Known Allergies  Family History: Family History  Problem Relation Age of Onset  . Stomach cancer Mother   . CVA Father   . Bladder Cancer Neg Hx   . Kidney cancer Neg Hx     Social History:  reports that she quit smoking about 17 years ago. She does not have any smokeless tobacco history on file. She reports that she does not drink alcohol or use illicit drugs.  ROS: UROLOGY Frequent Urination?: Yes Hard to postpone urination?: Yes Burning/pain with urination?: No Get up at night to urinate?: Yes Leakage of urine?: Yes Urine stream starts and stops?: Yes Trouble starting stream?: No Do you have to strain to urinate?: No Blood in urine?: No Urinary  tract infection?: Yes Sexually transmitted disease?: No Injury to kidneys or bladder?: No Painful intercourse?: No Weak stream?: No Currently pregnant?: No Vaginal bleeding?: No Last menstrual period?: n  Gastrointestinal Nausea?: No Vomiting?: No Indigestion/heartburn?: No Diarrhea?: No Constipation?: No  Constitutional Fever: No Night sweats?: No Weight loss?: No Fatigue?: No  Skin Skin rash/lesions?: No Itching?: No  Eyes Blurred vision?: No Double vision?: No  Ears/Nose/Throat Sore throat?: No Sinus problems?: No  Hematologic/Lymphatic Swollen glands?: No Easy bruising?: Yes  Cardiovascular Leg swelling?: No Chest pain?: Yes  Respiratory Cough?: Yes Shortness of breath?: Yes  Endocrine Excessive thirst?: No  Musculoskeletal Back pain?: No Joint pain?: No  Neurological Headaches?: No Dizziness?: No  Psychologic Depression?: No Anxiety?: No  Physical Exam: BP 150/79 mmHg  Pulse 69  Ht 5\' 8"  (1.727 m)  Wt 154 lb 6.4 oz (70.035 kg)  BMI 23.48 kg/m2  Constitutional:  Alert and oriented, No acute distress. HEENT: [place] AT, moist mucus membranes.  Trachea midline, no masses. Cardiovascular: No clubbing, cyanosis, or edema. Respiratory: Normal respiratory effort, no increased work of breathing. GI: Abdomen is soft, nontender, nondistended, no abdominal masses. Prior pfannenstiel scar. Upper midline scar. NO hernias.  GU: No CVA tenderness. Vaginal / introital atrophy age appropriate. No prolapse or palpable mass.  Skin: No rashes, bruises or suspicious lesions. Lymph: No cervical or inguinal adenopathy. Neurologic: Grossly intact, no focal deficits, moving all 4 extremities. Psychiatric: Normal mood and affect.  Laboratory Data: Lab Results  Component Value Date   WBC 7.8 06/27/2015   HGB 13.6 06/27/2015   HCT 42.5 06/27/2015   MCV 88.6 06/27/2015   PLT 274 06/27/2015    Lab Results  Component Value Date   CREATININE 0.52 06/27/2015      No results found for: PSA  No results found for: TESTOSTERONE  No results found for: HGBA1C  Urinalysis    Component Value Date/Time   COLORURINE YELLOW 08/02/2015 [place] 01/23/2014 1047   APPEARANCEUR CLOUDY* 08/02/2015 1217   APPEARANCEUR HAZY 01/23/2014 1047   LABSPEC 1.025 08/02/2015 1217   LABSPEC 1.020 01/23/2014 1047   PHURINE 5.5 08/02/2015 1217   PHURINE 6.0 01/23/2014 1047   GLUCOSEU NEGATIVE 08/02/2015 1217   GLUCOSEU NEGATIVE 01/23/2014 1047   HGBUR 3+* 08/02/2015 1217   HGBUR SMALL 01/23/2014 1047   BILIRUBINUR negative 08/04/2015 1140   BILIRUBINUR 1+* 08/02/2015 1217   BILIRUBINUR NEGATIVE 01/23/2014 [place] 08/02/2015 1217   KETONESUR NEGATIVE 01/23/2014 1047  PROTEINUR negative 08/04/2015 1140   PROTEINUR >300* 08/02/2015 1217   PROTEINUR 30 mg/dL 01/23/2014 1047   UROBILINOGEN 0.2 08/04/2015 1140   NITRITE negative 08/04/2015 1140   NITRITE NEGATIVE 08/02/2015 1217   NITRITE NEGATIVE 01/23/2014 1047   LEUKOCYTESUR Negative 08/04/2015 1140   LEUKOCYTESUR TRACE 01/23/2014 1047       Cystoscopy Procedure Note  Patient identification was confirmed, informed consent was obtained, and patient was prepped using Betadine solution.  Lidocaine jelly was administered per urethral meatus.    Preoperative abx where received prior to procedure.    Procedure: - Flexible cystoscope introduced, without any difficulty.   - Thorough search of the bladder revealed:    normal urethral meatus    no stones    no ulcers     no tumors    no urethral polyps  Large papillary Rt mass trigone mass, No additional lesions. Lt UO clearly visualized. Rt UO dificult to visualize.    Post-Procedure: - Patient tolerated the procedure well   Assessment & Plan:    1 - Microscopic Hematuria - likely from bladder cancer and or non-obstructing renal stone as per below.   2 - Urge Urinary Incontinence - suspect this is aggravated by  bladder cancer. Will focus efforts on management of this, hopefully after treatment her irritative voiding symptoms will improve.    3 - Nephrolithiasis - current stone burdent relatively small, non-obstructive, and is low risk lower pole location. Do nor rec "pre-emptive" treatment unless she winds up undergoing cystectomy, for which I would rec pre-cystectomy left URS to stone free.   4 - Bladder Cancer - Discussed natural history and likely clnically localized disease,  Though pulm lesion does raise some concer of possible distant disease.  Rec operative TURBT next avail. May need Rt ureteral stent peri-op given as comes very close to Rt UO.  We discussed operative biopsy / transurethral resection as the best next step for diagnostic and therapeutic purposes with goals being to remove all visible cancer and obtain tissue for pathologic exam. We discussed that for some low-grade tumors, this may be all the treatment required, but that for many other tumors such as high-grade lesions, further therapy including surgery and or chemotherapy may be warranted. We also outlined the fact that any bladder cancer diagnosis will require close follow-up with periodic upper and lower tract evaluation. We discussed risks including bleeding, infection, damage to kidney / ureter / bladder including bladder perforation which can typically managed with prolonged foley catheterization. We mentioned anesthetic and other rare risks including DVT, PE, MI, and mortality. I also mentioned that adjunctive procedures such as ureteral stenting, retrograde pyelography, and ureteroscopy may be necessary to fully evaluate the urinary tract depending on intra-operative findings. After answering all questions to the patient's satisfaction, they wish to proceed.   5 - Left Lower Lobe Lung Nodule - DDX benign scarring, primary lung neoplasm, metastasis discussed. Will refer Pulm for eval (will likely need PET and / or BX).   Alexis Frock, [facility] [address] [place], [place] [address] [phone]

## 2015-09-29 NOTE — Addendum Note (Signed)
Addended by: Wilson Singer on: 09/29/2015 10:35 AM   Modules accepted: Orders

## 2015-09-30 ENCOUNTER — Encounter: Payer: Self-pay | Admitting: Pulmonary Disease

## 2015-09-30 ENCOUNTER — Telehealth: Payer: Self-pay | Admitting: Radiology

## 2015-09-30 NOTE — Telephone Encounter (Signed)
Pt notified of surgery scheduled 10/20/15, pre-admit testing appt on 10/07/15 @1 :00 & to call Friday prior to surgery for arrival time to SDS. Pt voices understanding.

## 2015-10-07 ENCOUNTER — Encounter
Admission: RE | Admit: 2015-10-07 | Discharge: 2015-10-07 | Disposition: A | Discharge status: Home / Self Care | Payer: Medicare Other | Source: Ambulatory Visit | Attending: Urology | Admitting: Urology

## 2015-10-07 DIAGNOSIS — I10 Essential (primary) hypertension: Secondary | ICD-10-CM | POA: Diagnosis not present

## 2015-10-07 DIAGNOSIS — Z01812 Encounter for preprocedural laboratory examination: Secondary | ICD-10-CM | POA: Diagnosis not present

## 2015-10-07 DIAGNOSIS — E785 Hyperlipidemia, unspecified: Secondary | ICD-10-CM | POA: Insufficient documentation

## 2015-10-07 DIAGNOSIS — I509 Heart failure, unspecified: Secondary | ICD-10-CM | POA: Insufficient documentation

## 2015-10-07 LAB — CBC
HEMATOCRIT: 43.3 % (ref 35.0–47.0)
Hemoglobin: 13.8 g/dL (ref 12.0–16.0)
MCH: 29.7 pg (ref 26.0–34.0)
MCHC: 31.7 g/dL — ABNORMAL LOW (ref 32.0–36.0)
MCV: 93.7 fL (ref 80.0–100.0)
Platelets: 243 10*3/uL (ref 150–440)
RBC: 4.63 MIL/uL (ref 3.80–5.20)
RDW: 13.9 % (ref 11.5–14.5)
WBC: 6.1 10*3/uL (ref 3.6–11.0)

## 2015-10-07 LAB — POTASSIUM: Potassium: 4.4 mmol/L (ref 3.5–5.1)

## 2015-10-07 NOTE — Patient Instructions (Signed)
Your procedure is scheduled on: Monday 10/20/15 Report to Day Surgery. 2ND FLOOR MEDICAL MALL ENTRANCE To find out your arrival time please call [phone] between 1PM - 3PM on Friday 10/17/15.  Remember: Instructions that are not followed completely may result in serious medical risk, up to and including death, or upon the discretion of your surgeon and anesthesiologist your surgery may need to be rescheduled.    __X__ 1. Do not eat food or drink liquids after midnight. No gum chewing or hard candies.     __X__ 2. No Alcohol for 24 hours before or after surgery.   ____ 3. Bring all medications with you on the day of surgery if instructed.    __X__ 4. Notify your doctor if there is any change in your medical condition     (cold, fever, infections).     Do not wear jewelry, make-up, hairpins, clips or nail polish.  Do not wear lotions, powders, or perfumes.   Do not shave 48 hours prior to surgery. Men may shave face and neck.  Do not bring valuables to the hospital.    [facility] is not responsible for any belongings or valuables.               Contacts, dentures or bridgework may not be worn into surgery.  Leave your suitcase in the car. After surgery it may be brought to your room.  For patients admitted to the hospital, discharge time is determined by your                treatment team.   Patients discharged the day of surgery will not be allowed to drive home.   Please read over the following fact sheets that you were given:   Surgical Site Infection Prevention   __X__ Take these medicines the morning of surgery with A SIP OF WATER:    1. METOPROLOL  2. AMLODIPINE  3.   4.  5.  6.  ____ Fleet Enema (as directed)   ____ Use CHG Soap as directed  __X__ Use inhalers on the day of surgery  ____ Stop metformin 2 days prior to surgery    ____ Take 1/2 of usual insulin dose the night before surgery and none on the morning of surgery.   __X__ Stop Coumadin/Plavix/aspirin  on 5 DAYS BEFORE SURGERY AS PREVIOUSLY INSTRUCTED  ____ Stop Anti-inflammatories on    ____ Stop supplements until after surgery.    ____ Bring C-Pap to the hospital.

## 2015-10-08 DIAGNOSIS — E7800 Pure hypercholesterolemia, unspecified: Secondary | ICD-10-CM | POA: Diagnosis not present

## 2015-10-08 DIAGNOSIS — I872 Venous insufficiency (chronic) (peripheral): Secondary | ICD-10-CM | POA: Diagnosis not present

## 2015-10-08 DIAGNOSIS — R0602 Shortness of breath: Secondary | ICD-10-CM | POA: Diagnosis not present

## 2015-10-08 DIAGNOSIS — Z9889 Other specified postprocedural states: Secondary | ICD-10-CM | POA: Diagnosis not present

## 2015-10-08 DIAGNOSIS — Z01810 Encounter for preprocedural cardiovascular examination: Secondary | ICD-10-CM | POA: Diagnosis not present

## 2015-10-08 DIAGNOSIS — I10 Essential (primary) hypertension: Secondary | ICD-10-CM | POA: Diagnosis not present

## 2015-10-08 LAB — URINE CULTURE

## 2015-10-09 ENCOUNTER — Telehealth: Payer: Self-pay

## 2015-10-09 NOTE — Telephone Encounter (Signed)
LMOM

## 2015-10-09 NOTE — Pre-Procedure Instructions (Addendum)
URINE CULTURE CALLED AND FAXED TO [facility] AT DR Audree Bane. They are doing cath specimen in office

## 2015-10-09 NOTE — Telephone Encounter (Signed)
-----   Message from Hollice Espy, MD sent at 10/08/2015  5:10 PM EDT ----- This patient needs to come in for a catheterized specimen for urine culture prior to surgery. She's had 2 specimens which appear to be contaminated. It is imperative that we have a accurate urine culture prior to surgery.  Hollice Espy, MD

## 2015-10-10 DIAGNOSIS — R0602 Shortness of breath: Secondary | ICD-10-CM | POA: Diagnosis not present

## 2015-10-10 MED ORDER — CEFAZOLIN SODIUM-DEXTROSE 2-4 GM/100ML-% IV SOLN: 2.0000 g | Freq: Once | INTRAVENOUS | Status: DC

## 2015-10-13 ENCOUNTER — Ambulatory Visit (INDEPENDENT_AMBULATORY_CARE_PROVIDER_SITE_OTHER): Payer: Medicare Other | Admitting: Pulmonary Disease

## 2015-10-13 ENCOUNTER — Encounter: Payer: Self-pay | Admitting: Pulmonary Disease

## 2015-10-13 ENCOUNTER — Ambulatory Visit (INDEPENDENT_AMBULATORY_CARE_PROVIDER_SITE_OTHER): Payer: Medicare Other

## 2015-10-13 VITALS — BP 150/92 | HR 69 | Ht 68.0 in | Wt 154.0 lb

## 2015-10-13 DIAGNOSIS — N3289 Other specified disorders of bladder: Secondary | ICD-10-CM | POA: Diagnosis not present

## 2015-10-13 DIAGNOSIS — R918 Other nonspecific abnormal finding of lung field: Secondary | ICD-10-CM

## 2015-10-13 DIAGNOSIS — Z87891 Personal history of nicotine dependence: Secondary | ICD-10-CM

## 2015-10-13 MED ORDER — BUDESONIDE-FORMOTEROL FUMARATE 160-4.5 MCG/ACT IN AERO: 2.0000 | INHALATION_SPRAY | Freq: Two times a day (BID) | RESPIRATORY_TRACT | Status: DC

## 2015-10-13 NOTE — Patient Instructions (Signed)
You have a spot on your CXR that we are going to evaluate with a CT scan of your chest

## 2015-10-13 NOTE — Progress Notes (Signed)
In and Out Catheterization  Patient is present today for a I & O catheterization prior to surgery. Patient was cleaned and prepped in a sterile fashion with betadine and Lidocaine 2% jelly was instilled into the urethra.  A 14FR cath was inserted no complications were noted , 151ml of urine return was noted, urine was yellow in color. A clean urine sample was collected for u/a and cx. Bladder was drained  And catheter was removed with out difficulty.    Preformed by: Toniann Fail, LPN

## 2015-10-14 DIAGNOSIS — I10 Essential (primary) hypertension: Secondary | ICD-10-CM | POA: Diagnosis not present

## 2015-10-14 DIAGNOSIS — Z01810 Encounter for preprocedural cardiovascular examination: Secondary | ICD-10-CM | POA: Diagnosis not present

## 2015-10-14 DIAGNOSIS — I872 Venous insufficiency (chronic) (peripheral): Secondary | ICD-10-CM | POA: Diagnosis not present

## 2015-10-14 DIAGNOSIS — Z9889 Other specified postprocedural states: Secondary | ICD-10-CM | POA: Diagnosis not present

## 2015-10-14 DIAGNOSIS — E7800 Pure hypercholesterolemia, unspecified: Secondary | ICD-10-CM | POA: Diagnosis not present

## 2015-10-14 DIAGNOSIS — R0602 Shortness of breath: Secondary | ICD-10-CM | POA: Diagnosis not present

## 2015-10-14 LAB — URINALYSIS, COMPLETE
BILIRUBIN UA: NEGATIVE
GLUCOSE, UA: NEGATIVE
Ketones, UA: NEGATIVE
NITRITE UA: NEGATIVE
SPEC GRAV UA: 1.015 (ref 1.005–1.030)
Urobilinogen, Ur: 1 mg/dL (ref 0.2–1.0)
pH, UA: 7 (ref 5.0–7.5)

## 2015-10-14 LAB — MICROSCOPIC EXAMINATION
Epithelial Cells (non renal): NONE SEEN /hpf (ref 0–10)
RBC, UA: >30 /hpf — AB (ref 0–?)
WBC, UA: >30 /hpf — AB (ref 0–?)

## 2015-10-15 ENCOUNTER — Encounter: Payer: Self-pay | Admitting: Pulmonary Disease

## 2015-10-15 ENCOUNTER — Telehealth: Payer: Self-pay | Admitting: Urology

## 2015-10-15 ENCOUNTER — Telehealth: Payer: Self-pay | Admitting: Pulmonary Disease

## 2015-10-15 NOTE — Telephone Encounter (Signed)
Pt would like to cancel her CT appt, due to she has to have "heart surgery",

## 2015-10-15 NOTE — Telephone Encounter (Signed)
Per Dr. Alva Garnet, CT Chest and F/U appointment cancelled. Recall order placed to contact pt to r/s CT Chest and F/U within 3 months. Recall Order placed and CT Chest cancelled. Pt aware. Rhonda J Cobb

## 2015-10-15 NOTE — Telephone Encounter (Signed)
It appears that Melissa Cochran is scheduled to have a Heart Cath on 10/21/15. Pt cancelled her F/U appointment with Dr. Alva Garnet and requested to have her CT Chest cancelled.  Just wanted to let Dr. Alva Garnet know before I cancelled. Rhonda J Cobb

## 2015-10-15 NOTE — Telephone Encounter (Signed)
Called and spoke with patient and advised her that we have cancelled both appointments (CT Chest and F/U) and have placed a recall to contact her back to R/S these appointments last of August or September.  Pt voiced understanding. Catha Gosselin'

## 2015-10-15 NOTE — Telephone Encounter (Signed)
Per DS, please have pt to reschedule ct and f/u within 3 months. Thanks.

## 2015-10-15 NOTE — Progress Notes (Signed)
PULMONARY CONSULT NOTE  Requesting MD/Service: Tresa Moore, Urology Date of initial consultation: 10/13/15 Reason for consultation: Lung opacity  PT PROFILE: 80 y.o. F with no prior pulmonary history undergoing evaluation of bladder tumor referred for evaluation of a lung opacity seen on CTAP.  HPI:  34 F who is a difficult historian and is unsure why she is here to see me. From what I can discern from the records, she was referred to Urology for evaluation of hematuria and underwent CTAP which revealed a kidney stone, apparent bladder tumor and indeterminate opacities in LLL. She is referred for further eval of these. She denies CP, fever, purulent sputum, hemoptysis, LE edema and calf tenderness. She smoked until the year 2000 and has approx 40 P-Y hx of smoking    Past Medical History  Diagnosis Date  . Hypertension   . Hyperlipemia   . Heart block     left  . Adnexal mass   . Bleeding ulcer   . CHF (congestive heart failure) [facility])     Past Surgical History  Procedure Laterality Date  . Cardiac surgery      cardiac cath  . Abdominal hysterectomy    . Stomach surgery    . Hip surgery      left    MEDICATIONS: I have reviewed all medications and confirmed regimen as documented  Social History   Social History  . Marital Status: Widowed    Spouse Name: N/A  . Number of Children: N/A  . Years of Education: N/A   Occupational History  . Not on file.   Social History Main Topics  . Smoking status: Former Smoker -- 1.00 packs/day for 40 years    Quit date: 06/13/1998  . Smokeless tobacco: Never Used  . Alcohol Use: No  . Drug Use: No  . Sexual Activity: Not Currently   Other Topics Concern  . Not on file   Social History Narrative    Family History  Problem Relation Age of Onset  . Stomach cancer Mother   . CVA Father   . Bladder Cancer Neg Hx   . Kidney cancer Neg Hx     ROS: No fever, myalgias/arthralgias, unexplained weight loss or weight gain No new  focal weakness or sensory deficits No otalgia, hearing loss, visual changes, nasal and sinus symptoms, mouth and throat problems No neck pain or adenopathy No abdominal pain, N/V/D, diarrhea, change in bowel pattern No dysuria, change in urinary pattern   Filed Vitals:   10/13/15 1048  BP: 150/92  Pulse: 69  Height: 5\' 8"  (1.727 m)  Weight: 154 lb (69.854 kg)  SpO2: 98%     EXAM:  Gen: WDWN, No overt respiratory distress HEENT: NCAT, sclera white, oropharynx normal Neck: Supple without LAN, thyromegaly, JVD Lungs: breath sounds full, percussion: normal, No adventitious sounds Cardiovascular: RRR, no murmurs noted Abdomen: Soft, nontender, normal BS Ext: without clubbing, cyanosis, symmetric LE edema Neuro: CNs grossly intact, motor and sensory intact Skin: Limited exam, no lesions noted  DATA:   BMP Latest Ref Rng 10/07/2015 08/26/2015 06/27/2015  Glucose 65 - 99 mg/dL - 79 91  BUN 8 - 27 mg/dL - 16 22(H)  Creatinine 0.57 - 1.00 mg/dL - 0.65 0.52  BUN/Creat Ratio 12 - 28 - 25 -  Sodium 134 - 144 mmol/L - 141 137  Potassium 3.5 - 5.1 mmol/L 4.4 5.2 4.0  Chloride 96 - 106 mmol/L - 97 102  CO2 18 - 29 mmol/L - 25 28  Calcium 8.7 - 10.3 mg/dL - 9.9 9.1    CBC Latest Ref Rng 10/07/2015 06/27/2015 01/27/2015  WBC 3.6 - 11.0 K/uL 6.1 7.8 9.8  Hemoglobin 12.0 - 16.0 g/dL 13.8 13.6 12.1  Hematocrit 35.0 - 47.0 % 43.3 42.5 36.5  Platelets 150 - 440 K/uL 243 274 303    CXR 08/02/15:   Opacity in L CP angle  CTAP 09/16/15: Lower chest: There is a vaguely nodular 2.5 x 1.8 cm focus of consolidation and ground-glass opacity in the basilar right lower lobe with internal air bronchograms (series 6/ image 7), new since 10/22/2012. There is new ground-glass tree-in-bud opacity in the peripheral left lower lobe (series 6/image 3). There is a 1.6 x 1.3 cm partially visualized medial left lower lobe solid pulmonary nodule (series 4/image 4). Coronary atherosclerosis. Coarse  mitral annular calcification.  IMPRESSION:     ICD-9-CM ICD-10-CM   1. Opacity of lung on imaging study 793.19 R91.8 CT Chest W Contrast     Basic Metabolic Panel (BMET)  2. Former smoker V15.82 [id number] Basic Metabolic Panel (BMET)   In setting of bladder tumor, concern for metastatic disease  PLAN:  Dedicated CT chest with contrast. Depending on findings, we will consider best diagnostic strategy. She will likely require a biopsy of some form ROV 4-6 weeks. After review of CT chest, I will contact patient if a biopsy is to be arranged   Merton Border, MD PCCM service Mobile [phone] Pager [phone] 10/15/2015

## 2015-10-16 LAB — CULTURE, URINE COMPREHENSIVE

## 2015-10-20 ENCOUNTER — Ambulatory Visit: Admission: RE | Admit: 2015-10-20 | Payer: Medicare Other | Source: Ambulatory Visit | Admitting: Urology

## 2015-10-20 ENCOUNTER — Encounter: Admission: RE | Payer: Self-pay | Source: Ambulatory Visit

## 2015-10-20 SURGERY — TURBT (TRANSURETHRAL RESECTION OF BLADDER TUMOR)
Anesthesia: Choice

## 2015-10-21 ENCOUNTER — Encounter
Admission: RE | Disposition: A | Discharge status: Home / Self Care | Payer: Self-pay | Source: Ambulatory Visit | Attending: Cardiology

## 2015-10-21 ENCOUNTER — Encounter: Payer: Self-pay | Admitting: Cardiology

## 2015-10-21 ENCOUNTER — Ambulatory Visit
Admission: RE | Admit: 2015-10-21 | Discharge: 2015-10-22 | Disposition: A | Discharge status: Home / Self Care | Payer: Medicare Other | Source: Ambulatory Visit | Attending: Cardiology | Admitting: Cardiology

## 2015-10-21 DIAGNOSIS — K219 Gastro-esophageal reflux disease without esophagitis: Secondary | ICD-10-CM | POA: Diagnosis not present

## 2015-10-21 DIAGNOSIS — Z823 Family history of stroke: Secondary | ICD-10-CM | POA: Insufficient documentation

## 2015-10-21 DIAGNOSIS — Z79899 Other long term (current) drug therapy: Secondary | ICD-10-CM | POA: Diagnosis not present

## 2015-10-21 DIAGNOSIS — Z90710 Acquired absence of both cervix and uterus: Secondary | ICD-10-CM | POA: Insufficient documentation

## 2015-10-21 DIAGNOSIS — I2510 Atherosclerotic heart disease of native coronary artery without angina pectoris: Secondary | ICD-10-CM | POA: Diagnosis present

## 2015-10-21 DIAGNOSIS — Z7982 Long term (current) use of aspirin: Secondary | ICD-10-CM | POA: Diagnosis not present

## 2015-10-21 DIAGNOSIS — I10 Essential (primary) hypertension: Secondary | ICD-10-CM | POA: Diagnosis not present

## 2015-10-21 DIAGNOSIS — Z87891 Personal history of nicotine dependence: Secondary | ICD-10-CM | POA: Insufficient documentation

## 2015-10-21 DIAGNOSIS — E785 Hyperlipidemia, unspecified: Secondary | ICD-10-CM | POA: Diagnosis not present

## 2015-10-21 DIAGNOSIS — I25110 Atherosclerotic heart disease of native coronary artery with unstable angina pectoris: Secondary | ICD-10-CM | POA: Diagnosis not present

## 2015-10-21 DIAGNOSIS — Z809 Family history of malignant neoplasm, unspecified: Secondary | ICD-10-CM | POA: Diagnosis not present

## 2015-10-21 HISTORY — PX: CARDIAC CATHETERIZATION: SHX172

## 2015-10-21 SURGERY — CORONARY STENT INTERVENTION
Anesthesia: Moderate Sedation

## 2015-10-21 MED ORDER — SODIUM CHLORIDE 0.9% FLUSH
3.0000 mL | Freq: Two times a day (BID) | INTRAVENOUS | Status: DC
  Administered 2015-10-21 – 2015-10-22 (×3): 3 mL via INTRAVENOUS

## 2015-10-21 MED ORDER — HEPARIN (PORCINE) IN NACL 2-0.9 UNIT/ML-% IJ SOLN
INTRAMUSCULAR | Status: AC
  Filled 2015-10-21: qty 500

## 2015-10-21 MED ORDER — SODIUM CHLORIDE 0.9% FLUSH: 3.0000 mL | INTRAVENOUS | Status: DC | PRN

## 2015-10-21 MED ORDER — POTASSIUM CHLORIDE CRYS ER 10 MEQ PO TBCR
10.0000 meq | EXTENDED_RELEASE_TABLET | Freq: Two times a day (BID) | ORAL | Status: DC
  Administered 2015-10-21 – 2015-10-22 (×3): 10 meq via ORAL
  Filled 2015-10-21 (×3): qty 1

## 2015-10-21 MED ORDER — METOPROLOL TARTRATE 100 MG PO TABS
100.0000 mg | ORAL_TABLET | Freq: Two times a day (BID) | ORAL | Status: DC
  Administered 2015-10-21 – 2015-10-22 (×3): 100 mg via ORAL
  Filled 2015-10-21 (×5): qty 1

## 2015-10-21 MED ORDER — METOPROLOL TARTRATE 5 MG/5ML IV SOLN
INTRAVENOUS | Status: AC
  Filled 2015-10-21: qty 5

## 2015-10-21 MED ORDER — SODIUM CHLORIDE 0.9 % IV SOLN
INTRAVENOUS | Status: DC
  Administered 2015-10-21: 08:00:00 via INTRAVENOUS

## 2015-10-21 MED ORDER — CLOPIDOGREL BISULFATE 75 MG PO TABS
ORAL_TABLET | ORAL | Status: AC
  Filled 2015-10-21: qty 4

## 2015-10-21 MED ORDER — NITROGLYCERIN 1 MG/10 ML FOR IR/CATH LAB
INTRA_ARTERIAL | Status: DC | PRN
  Administered 2015-10-21: 300 ug via INTRACORONARY
  Administered 2015-10-21: 400 ug via INTRACORONARY

## 2015-10-21 MED ORDER — ASPIRIN 81 MG PO CHEW
CHEWABLE_TABLET | ORAL | Status: AC
  Filled 2015-10-21: qty 4

## 2015-10-21 MED ORDER — FENTANYL CITRATE (PF) 100 MCG/2ML IJ SOLN
INTRAMUSCULAR | Status: DC | PRN
  Administered 2015-10-21: 25 ug via INTRAVENOUS

## 2015-10-21 MED ORDER — MIDAZOLAM HCL 2 MG/2ML IJ SOLN
INTRAMUSCULAR | Status: AC
  Filled 2015-10-21: qty 2

## 2015-10-21 MED ORDER — ALBUTEROL SULFATE HFA 108 (90 BASE) MCG/ACT IN AERS: 2.0000 | INHALATION_SPRAY | Freq: Four times a day (QID) | RESPIRATORY_TRACT | Status: DC | PRN

## 2015-10-21 MED ORDER — FUROSEMIDE 20 MG PO TABS
20.0000 mg | ORAL_TABLET | Freq: Every day | ORAL | Status: DC
  Administered 2015-10-21 – 2015-10-22 (×2): 20 mg via ORAL
  Filled 2015-10-21 (×2): qty 1

## 2015-10-21 MED ORDER — ACETAMINOPHEN 325 MG PO TABS
650.0000 mg | ORAL_TABLET | ORAL | Status: DC | PRN
  Administered 2015-10-21 – 2015-10-22 (×3): 650 mg via ORAL
  Filled 2015-10-21 (×3): qty 2

## 2015-10-21 MED ORDER — ASPIRIN 81 MG PO CHEW
81.0000 mg | CHEWABLE_TABLET | Freq: Every day | ORAL | Status: DC
  Administered 2015-10-22: 81 mg via ORAL
  Filled 2015-10-21 (×2): qty 1

## 2015-10-21 MED ORDER — SODIUM CHLORIDE 0.9 % IV SOLN
250.0000 mg | INTRAVENOUS | Status: DC | PRN
  Administered 2015-10-21: 1.75 mg/kg/h via INTRAVENOUS

## 2015-10-21 MED ORDER — ONDANSETRON HCL 4 MG/2ML IJ SOLN: 4.0000 mg | Freq: Four times a day (QID) | INTRAMUSCULAR | Status: DC | PRN

## 2015-10-21 MED ORDER — ASPIRIN 81 MG PO CHEW: 81.0000 mg | CHEWABLE_TABLET | ORAL | Status: DC

## 2015-10-21 MED ORDER — FENTANYL CITRATE (PF) 100 MCG/2ML IJ SOLN
INTRAMUSCULAR | Status: AC
  Filled 2015-10-21: qty 2

## 2015-10-21 MED ORDER — NITROGLYCERIN 5 MG/ML IV SOLN
INTRAVENOUS | Status: AC
  Filled 2015-10-21: qty 10

## 2015-10-21 MED ORDER — NITROFURANTOIN MONOHYD MACRO 100 MG PO CAPS
100.0000 mg | ORAL_CAPSULE | Freq: Two times a day (BID) | ORAL | Status: DC
  Administered 2015-10-21 – 2015-10-22 (×3): 100 mg via ORAL
  Filled 2015-10-21 (×4): qty 1

## 2015-10-21 MED ORDER — SODIUM CHLORIDE 0.9 % IV SOLN: 250.0000 mL | INTRAVENOUS | Status: DC | PRN

## 2015-10-21 MED ORDER — AMLODIPINE BESYLATE 5 MG PO TABS
5.0000 mg | ORAL_TABLET | Freq: Every day | ORAL | Status: DC
  Administered 2015-10-21 – 2015-10-22 (×2): 5 mg via ORAL
  Filled 2015-10-21 (×4): qty 1

## 2015-10-21 MED ORDER — BIVALIRUDIN BOLUS VIA INFUSION - CUPID
INTRAVENOUS | Status: DC | PRN
  Administered 2015-10-21: 52.425 mg via INTRAVENOUS

## 2015-10-21 MED ORDER — BIVALIRUDIN 250 MG IV SOLR
INTRAVENOUS | Status: AC
  Filled 2015-10-21: qty 250

## 2015-10-21 MED ORDER — GABAPENTIN 100 MG PO CAPS
100.0000 mg | ORAL_CAPSULE | Freq: Two times a day (BID) | ORAL | Status: DC
Start: 2015-10-21 — End: 2015-10-22
  Administered 2015-10-21 – 2015-10-22 (×3): 100 mg via ORAL
  Filled 2015-10-21 (×3): qty 1

## 2015-10-21 MED ORDER — SODIUM CHLORIDE 0.9 % WEIGHT BASED INFUSION: 1.0000 mL/kg/h | INTRAVENOUS | Status: DC

## 2015-10-21 MED ORDER — METOPROLOL TARTRATE 5 MG/5ML IV SOLN
INTRAVENOUS | Status: DC | PRN
  Administered 2015-10-21: 5 mg via INTRAVENOUS

## 2015-10-21 MED ORDER — ALBUTEROL SULFATE (2.5 MG/3ML) 0.083% IN NEBU: 2.5000 mg | INHALATION_SOLUTION | Freq: Four times a day (QID) | RESPIRATORY_TRACT | Status: DC | PRN

## 2015-10-21 MED ORDER — SODIUM CHLORIDE 0.9 % WEIGHT BASED INFUSION: 3.0000 mL/kg/h | INTRAVENOUS | Status: DC

## 2015-10-21 MED ORDER — SODIUM CHLORIDE 0.9% FLUSH: 3.0000 mL | Freq: Two times a day (BID) | INTRAVENOUS | Status: DC

## 2015-10-21 MED ORDER — CEFAZOLIN SODIUM 1-5 GM-% IV SOLN: 1.0000 g | Freq: Once | INTRAVENOUS | Status: DC

## 2015-10-21 MED ORDER — CLOPIDOGREL BISULFATE 75 MG PO TABS
ORAL_TABLET | ORAL | Status: DC | PRN
  Administered 2015-10-21: 300 mg via ORAL

## 2015-10-21 MED ORDER — CLOPIDOGREL BISULFATE 75 MG PO TABS
75.0000 mg | ORAL_TABLET | Freq: Every day | ORAL | Status: DC
  Administered 2015-10-22: 75 mg via ORAL
  Filled 2015-10-21: qty 1

## 2015-10-21 MED ORDER — SODIUM CHLORIDE 0.9 % WEIGHT BASED INFUSION: 3.0000 mL/kg/h | INTRAVENOUS | Status: AC

## 2015-10-21 MED ORDER — MIDAZOLAM HCL 2 MG/2ML IJ SOLN
INTRAMUSCULAR | Status: DC | PRN
  Administered 2015-10-21: 1 mg via INTRAVENOUS

## 2015-10-21 MED ORDER — IOPAMIDOL (ISOVUE-300) INJECTION 61%
INTRAVENOUS | Status: DC | PRN
  Administered 2015-10-21: 240 mL via INTRA_ARTERIAL

## 2015-10-21 MED ORDER — ASPIRIN 81 MG PO CHEW
CHEWABLE_TABLET | ORAL | Status: DC | PRN
  Administered 2015-10-21: 324 mg via ORAL

## 2015-10-21 SURGICAL SUPPLY — 17 items
BALLN TREK RX 2.25X15 (BALLOONS) ×4
BALLOON TREK RX 2.25X15 (BALLOONS) ×2 IMPLANT
CATH INFINITI 5FR ANG PIGTAIL (CATHETERS) ×4 IMPLANT
CATH INFINITI 5FR JL4 (CATHETERS) ×4 IMPLANT
CATH INFINITI JR4 5F (CATHETERS) ×4 IMPLANT
CATH VISTA GUIDE 6FR XB3.5 (CATHETERS) ×4 IMPLANT
DEVICE CLOSURE MYNXGRIP 6/7F (Vascular Products) ×4 IMPLANT
DEVICE INFLAT 30 PLUS (MISCELLANEOUS) ×4 IMPLANT
DEVICE SAFEGUARD 24CM (GAUZE/BANDAGES/DRESSINGS) ×4 IMPLANT
KIT MANI 3VAL PERCEP (MISCELLANEOUS) ×4 IMPLANT
NEEDLE PERC 18GX7CM (NEEDLE) ×4 IMPLANT
PACK CARDIAC CATH (CUSTOM PROCEDURE TRAY) ×4 IMPLANT
SHEATH AVANTI 5FR X 11CM (SHEATH) ×4 IMPLANT
SHEATH AVANTI 6FR X 11CM (SHEATH) ×4 IMPLANT
STENT MINI VISION RX 2.25X15 (Permanent Stent) ×4 IMPLANT
WIRE ASAHI PROWATER 180CM (WIRE) ×4 IMPLANT
WIRE EMERALD 3MM-J .035X150CM (WIRE) ×4 IMPLANT

## 2015-10-21 NOTE — Discharge Instructions (Signed)

## 2015-10-21 NOTE — Progress Notes (Signed)
Report called to [place]. Patient alert and oriented. Reporting no pain. Right groin WNL with PAD in place, deflate beginning at 1400. Pulses WNL. Metoprolol administered upon return per Paraschos. Will continue to monitor BP, VSS>

## 2015-10-21 NOTE — Progress Notes (Signed)
Blood pressure 181/71 HR 63, dr paraschos notified new order received

## 2015-10-21 NOTE — Progress Notes (Signed)
Patient admitted to unit from special, patient alert and oriented, denies any apin at this time, right groin PAD in place no bleeding noted, family at bedside VSS mood calm

## 2015-10-22 DIAGNOSIS — Z90710 Acquired absence of both cervix and uterus: Secondary | ICD-10-CM | POA: Diagnosis not present

## 2015-10-22 DIAGNOSIS — E785 Hyperlipidemia, unspecified: Secondary | ICD-10-CM | POA: Diagnosis not present

## 2015-10-22 DIAGNOSIS — Z87891 Personal history of nicotine dependence: Secondary | ICD-10-CM | POA: Diagnosis not present

## 2015-10-22 DIAGNOSIS — K219 Gastro-esophageal reflux disease without esophagitis: Secondary | ICD-10-CM | POA: Diagnosis not present

## 2015-10-22 DIAGNOSIS — I10 Essential (primary) hypertension: Secondary | ICD-10-CM | POA: Diagnosis not present

## 2015-10-22 DIAGNOSIS — I25110 Atherosclerotic heart disease of native coronary artery with unstable angina pectoris: Secondary | ICD-10-CM | POA: Diagnosis not present

## 2015-10-22 LAB — CBC
HEMATOCRIT: 37.8 % (ref 35.0–47.0)
Hemoglobin: 12.8 g/dL (ref 12.0–16.0)
MCH: 30.9 pg (ref 26.0–34.0)
MCHC: 33.8 g/dL (ref 32.0–36.0)
MCV: 91.3 fL (ref 80.0–100.0)
Platelets: 188 10*3/uL (ref 150–440)
RBC: 4.14 MIL/uL (ref 3.80–5.20)
RDW: 13.4 % (ref 11.5–14.5)
WBC: 6 10*3/uL (ref 3.6–11.0)

## 2015-10-22 LAB — BASIC METABOLIC PANEL
ANION GAP: 7 (ref 5–15)
BUN: 15 mg/dL (ref 6–20)
CALCIUM: 9.2 mg/dL (ref 8.9–10.3)
CHLORIDE: 103 mmol/L (ref 101–111)
CO2: 27 mmol/L (ref 22–32)
Creatinine, Ser: 0.58 mg/dL (ref 0.44–1.00)
GFR calc non Af Amer: >60 mL/min (ref >60)
GLUCOSE: 102 mg/dL — AB (ref 65–99)
POTASSIUM: 4.3 mmol/L (ref 3.5–5.1)
Sodium: 137 mmol/L (ref 135–145)

## 2015-10-22 MED ORDER — AMLODIPINE BESYLATE 5 MG PO TABS: 5.0000 mg | ORAL_TABLET | Freq: Every day | ORAL | Status: DC

## 2015-10-22 MED ORDER — ASPIRIN 81 MG PO CHEW: 81.0000 mg | CHEWABLE_TABLET | Freq: Every day | ORAL | Status: DC

## 2015-10-22 NOTE — Progress Notes (Signed)
Patient discharged via wheelchair and private vehicle. IV removed and catheter intact. All discharge instructions given on post heart cath instructions and patient verbalizes understanding. Tele removed and returned. No prescriptions given to patient No distress noted.

## 2015-10-22 NOTE — Discharge Summary (Signed)
   Physician Discharge Summary  Patient ID: Melissa Cochran MRN: [id number] DOB/AGE: [birth year] 80 y.o.  Admit date: 10/21/2015 Discharge date: 10/22/2015  Primary Discharge Diagnosis Unstable angina Secondary Discharge Diagnosis coronary artery disease  Significant Diagnostic Studies: cardiac graphics: Cardiac catheterization and yes  Consults: None  Hospital Course: The patient underwent elective cardiac catheterization on 10/21/2015. Coronary arteriography revealed a high-grade stenosis in the first obtuse marginal branch. The patient underwent percutaneous coronary intervention receiving a bare metal stent with an excellent angiographic result. The patient had an uncomplicated hospital stay. She experienced no recurrent chest pain. She was ambulating without difficulty on the morning of 10/22/2015 and was discharged home with follow-up in one week.   Discharge Exam: Blood pressure 128/62, pulse 70, temperature 98.3 F (36.8 C), temperature source Oral, resp. rate 20, height 5\' 8"  (1.727 m), weight 72.303 kg (159 lb 6.4 oz), SpO2 96 %.   General appearance: alert Labs:   Lab Results  Component Value Date   WBC 6.0 10/22/2015   HGB 12.8 10/22/2015   HCT 37.8 10/22/2015   MCV 91.3 10/22/2015   PLT 188 10/22/2015    Recent Labs Lab 10/22/15 0358  NA 137  K 4.3  CL 103  CO2 27  BUN 15  CREATININE 0.58  CALCIUM 9.2  GLUCOSE 102*      Radiology:  EKG: Normal sinus rhythm  FOLLOW UP PLANS AND APPOINTMENTS    Medication List    TAKE these medications        acetaminophen 325 MG tablet  Commonly known as:  TYLENOL  Take 650 mg by mouth every 6 (six) hours as needed for mild pain or headache.     albuterol 108 (90 Base) MCG/ACT inhaler  Commonly known as:  PROVENTIL HFA;VENTOLIN HFA  Inhale 2 puffs into the lungs every 6 (six) hours as needed for wheezing or shortness of breath.     amLODipine 5 MG tablet  Commonly known as:  NORVASC  Take 5 mg by mouth  daily.     amLODipine 5 MG tablet  Commonly known as:  NORVASC  Take 1 tablet (5 mg total) by mouth daily.     aspirin 81 MG chewable tablet  Chew 1 tablet (81 mg total) by mouth daily.     clopidogrel 75 MG tablet  Commonly known as:  PLAVIX  Take 1 tablet (75 mg total) by mouth daily.     hydrochlorothiazide 12.5 MG capsule  Commonly known as:  MICROZIDE  Take 12.5 mg by mouth daily.     metoprolol 100 MG tablet  Commonly known as:  LOPRESSOR  Take 1 tablet (100 mg total) by mouth 2 (two) times daily.           Follow-up Information    Follow up with Barb Shear, MD In 1 week.   Specialty:  Cardiology   Why:  Thursday, June 22nd at 1130am, ccs   Contact information:   [facility] [place] [address] [phone]       BRING ALL MEDICATIONS WITH YOU TO FOLLOW UP APPOINTMENTS  Time spent with patient to include physician time: 25 minutes Signed:  Isaias Cowman MD, PhD, [facility] 10/22/2015, 12:01 PM

## 2015-10-22 NOTE — Progress Notes (Signed)
Pleasant woman.  Awake and alert, oriented x 4.  Denies pain.  Lungs clear.  Cath site pad removed and replaced with transparent drsg.  Preparing for discharge.

## 2015-10-22 NOTE — Progress Notes (Signed)
Ambulated in [place] tolerated well and sitting in chair with chair alarm on and call light in reach,  Right groin site stable. No signs of bleeding or hematoma noted. Will continue to monitor.

## 2015-10-22 NOTE — Care Management (Signed)
Elective cardiac cath resulting in PCI.  No complications.  No cost prohibitive antiplatelets/anticoagulation meds ordered.  No discharge needs.

## 2015-10-28 NOTE — Telephone Encounter (Signed)
error 

## 2015-10-30 DIAGNOSIS — R0602 Shortness of breath: Secondary | ICD-10-CM | POA: Diagnosis not present

## 2015-10-30 DIAGNOSIS — Z9889 Other specified postprocedural states: Secondary | ICD-10-CM | POA: Diagnosis not present

## 2015-10-30 DIAGNOSIS — Z955 Presence of coronary angioplasty implant and graft: Secondary | ICD-10-CM | POA: Insufficient documentation

## 2015-10-30 DIAGNOSIS — I10 Essential (primary) hypertension: Secondary | ICD-10-CM | POA: Diagnosis not present

## 2015-10-30 DIAGNOSIS — I872 Venous insufficiency (chronic) (peripheral): Secondary | ICD-10-CM | POA: Diagnosis not present

## 2015-10-30 DIAGNOSIS — E7800 Pure hypercholesterolemia, unspecified: Secondary | ICD-10-CM | POA: Diagnosis not present

## 2015-10-31 ENCOUNTER — Other Ambulatory Visit: Payer: Self-pay

## 2015-10-31 MED ORDER — METOPROLOL TARTRATE 100 MG PO TABS: 100.0000 mg | ORAL_TABLET | Freq: Two times a day (BID) | ORAL | Status: DC

## 2015-11-12 ENCOUNTER — Ambulatory Visit: Payer: Medicare Other

## 2015-11-14 ENCOUNTER — Ambulatory Visit: Payer: Medicare Other | Admitting: Pulmonary Disease

## 2015-11-28 DIAGNOSIS — I10 Essential (primary) hypertension: Secondary | ICD-10-CM | POA: Diagnosis not present

## 2015-11-28 DIAGNOSIS — R0602 Shortness of breath: Secondary | ICD-10-CM | POA: Diagnosis not present

## 2015-11-28 DIAGNOSIS — I872 Venous insufficiency (chronic) (peripheral): Secondary | ICD-10-CM | POA: Diagnosis not present

## 2015-11-28 DIAGNOSIS — E7800 Pure hypercholesterolemia, unspecified: Secondary | ICD-10-CM | POA: Diagnosis not present

## 2015-11-28 DIAGNOSIS — Z9889 Other specified postprocedural states: Secondary | ICD-10-CM | POA: Diagnosis not present

## 2015-11-28 DIAGNOSIS — Z955 Presence of coronary angioplasty implant and graft: Secondary | ICD-10-CM | POA: Diagnosis not present

## 2015-12-05 ENCOUNTER — Telehealth: Payer: Self-pay | Admitting: Radiology

## 2015-12-05 NOTE — Telephone Encounter (Signed)
Spoke with pt regarding surgery previously planned with Dr Erlene Quan that was postponed until after cardiac cath & surgical clearance by Dr Saralyn Pilar.  Pt would like to wait at this time. Requests return call after 12/22/15.

## 2015-12-23 NOTE — Telephone Encounter (Signed)
Pt requests a return call on 12/26/15 d/t transportation problems to discuss TURBT with Dr Erlene Quan.

## 2015-12-26 NOTE — Telephone Encounter (Signed)
LMOM

## 2015-12-26 NOTE — Telephone Encounter (Signed)
Offered pt several possible surgery dates to choose from. Pt states she will call back 12/29/15 after she discusses availability with her ride to & from surgery.

## 2016-01-07 ENCOUNTER — Other Ambulatory Visit: Payer: Self-pay | Admitting: Radiology

## 2016-01-07 DIAGNOSIS — C670 Malignant neoplasm of trigone of bladder: Secondary | ICD-10-CM

## 2016-01-07 NOTE — Telephone Encounter (Signed)
Notified patient of surgery scheduled with Dr Erlene Quan on 01/20/16, to RTC for cathed specimen ucx on 01/13/16 @10 :30, pre-admit testing appt on 01/13/16 @11 :45 & to call day prior to surgery for arrival time to SDS. Advised pt to hold Plavix beginning 01/15/16. Pt voices understanding.

## 2016-01-08 NOTE — Telephone Encounter (Signed)
Confirmed with pt to hold ASA 81mg  as well as Plavix beginning 01/15/16 & resume after surgery scheduled 01/20/16. Pt voices understanding.

## 2016-01-08 NOTE — Telephone Encounter (Signed)
LMOM advising pt to hold ASA 81mg  as well as Plavix beginning 01/15/16 & resume after surgery scheduled 01/20/16.

## 2016-01-13 ENCOUNTER — Encounter
Admission: RE | Admit: 2016-01-13 | Discharge: 2016-01-13 | Disposition: A | Discharge status: Home / Self Care | Payer: Medicare Other | Source: Ambulatory Visit | Attending: Urology | Admitting: Urology

## 2016-01-13 ENCOUNTER — Encounter: Payer: Self-pay | Admitting: Urology

## 2016-01-13 ENCOUNTER — Ambulatory Visit (INDEPENDENT_AMBULATORY_CARE_PROVIDER_SITE_OTHER): Payer: Medicare Other | Admitting: Family Medicine

## 2016-01-13 VITALS — BP 216/74 | HR 69 | Ht 68.0 in | Wt 161.7 lb

## 2016-01-13 DIAGNOSIS — N3289 Other specified disorders of bladder: Secondary | ICD-10-CM

## 2016-01-13 DIAGNOSIS — E785 Hyperlipidemia, unspecified: Secondary | ICD-10-CM | POA: Diagnosis not present

## 2016-01-13 DIAGNOSIS — I10 Essential (primary) hypertension: Secondary | ICD-10-CM | POA: Insufficient documentation

## 2016-01-13 DIAGNOSIS — Z01812 Encounter for preprocedural laboratory examination: Secondary | ICD-10-CM | POA: Diagnosis not present

## 2016-01-13 DIAGNOSIS — I872 Venous insufficiency (chronic) (peripheral): Secondary | ICD-10-CM | POA: Diagnosis not present

## 2016-01-13 DIAGNOSIS — R3129 Other microscopic hematuria: Secondary | ICD-10-CM | POA: Diagnosis not present

## 2016-01-13 HISTORY — DX: Atherosclerotic heart disease of native coronary artery without angina pectoris: I25.10

## 2016-01-13 HISTORY — DX: Pneumonia, unspecified organism: J18.9

## 2016-01-13 HISTORY — DX: Gastro-esophageal reflux disease without esophagitis: K21.9

## 2016-01-13 HISTORY — DX: Reserved for inherently not codable concepts without codable children: IMO0001

## 2016-01-13 HISTORY — DX: Personal history of peptic ulcer disease: Z87.11

## 2016-01-13 HISTORY — DX: Chronic obstructive pulmonary disease, unspecified: J44.9

## 2016-01-13 HISTORY — DX: Anxiety disorder, unspecified: F41.9

## 2016-01-13 HISTORY — DX: Unspecified osteoarthritis, unspecified site: M19.90

## 2016-01-13 HISTORY — DX: Neoplasm of unspecified behavior of bladder: D49.4

## 2016-01-13 LAB — URINALYSIS, COMPLETE
Bilirubin, UA: NEGATIVE
GLUCOSE, UA: NEGATIVE
KETONES UA: NEGATIVE
NITRITE UA: NEGATIVE
SPEC GRAV UA: 1.015 (ref 1.005–1.030)
UUROB: 0.2 mg/dL (ref 0.2–1.0)
pH, UA: 7 (ref 5.0–7.5)

## 2016-01-13 LAB — CBC
HCT: 41.9 % (ref 35.0–47.0)
Hemoglobin: 14 g/dL (ref 12.0–16.0)
MCH: 30.8 pg (ref 26.0–34.0)
MCHC: 33.5 g/dL (ref 32.0–36.0)
MCV: 91.9 fL (ref 80.0–100.0)
PLATELETS: 236 10*3/uL (ref 150–440)
RBC: 4.56 MIL/uL (ref 3.80–5.20)
RDW: 12.9 % (ref 11.5–14.5)
WBC: 5.8 10*3/uL (ref 3.6–11.0)

## 2016-01-13 LAB — BASIC METABOLIC PANEL
Anion gap: 6 (ref 5–15)
BUN: 16 mg/dL (ref 6–20)
CALCIUM: 9.6 mg/dL (ref 8.9–10.3)
CO2: 30 mmol/L (ref 22–32)
CREATININE: 0.53 mg/dL (ref 0.44–1.00)
Chloride: 102 mmol/L (ref 101–111)
GFR calc non Af Amer: >60 mL/min (ref >60)
Glucose, Bld: 107 mg/dL — ABNORMAL HIGH (ref 65–99)
Potassium: 3.7 mmol/L (ref 3.5–5.1)
Sodium: 138 mmol/L (ref 135–145)

## 2016-01-13 LAB — MICROSCOPIC EXAMINATION: RBC, UA: >30 /hpf — AB (ref 0–?)

## 2016-01-13 NOTE — Telephone Encounter (Signed)
Notified pt of medication correction. Advised patient to hold Plavix 5 days prior to surgery (beginning on 01/15/2016), and hold aspirin 3 days prior to surgery (beginning 01/17/2016).  Patient voices understanding.

## 2016-01-13 NOTE — Progress Notes (Signed)
In and Out Catheterization  Patient is present today for a I & O catheterization due to Bladder mass for pre-op. Patient was cleaned and prepped in a sterile fashion with betadine and Lidocaine 2% jelly was instilled into the urethra.  A 14FR cath was inserted no complications were noted , 83ml of urine return was noted, urine was pale yellow in color. A clean urine sample was collected for urine culture. Bladder was drained  And catheter was removed with out difficulty.    Preformed by: Elberta Leatherwood, CMA/ Donnie Mesa, CMA  Follow up/ Additional notes: Patient's blood pressure was elevated today and I spoke to Dr. Alyson Ingles, he informed me to have the patient go to the ER. When I spoke to the patient she refused to go to the ER. She stated her blood pressure is always high. Zara Council came in to the room to encourage her to go to the ER as well. Patient still refused to go. She did tell us that she will go to her Primary care office and also speak to Dr. Josefa Half her Cardiologist about the elevated BP. She is also suppose to go to [facility] today for Pre-op appointment. She is scheduled for TURBT on Tuesday  Next week.

## 2016-01-13 NOTE — Patient Instructions (Signed)
  Your procedure is scheduled on: 01/20/16 Tues Report to Same Day Surgery 2nd floor medical mall To find out your arrival time please call [phone] between 1PM - 3PM on 01/19/16 Mon Remember: Instructions that are not followed completely may result in serious medical risk, up to and including death, or upon the discretion of your surgeon and anesthesiologist your surgery may need to be rescheduled.    _x___ 1. Do not eat food or drink liquids after midnight. No gum chewing or hard candies.     __x__ 2. No Alcohol for 24 hours before or after surgery.   __x__3. No Smoking for 24 prior to surgery.   ____  4. Bring all medications with you on the day of surgery if instructed.    __x__ 5. Notify your doctor if there is any change in your medical condition     (cold, fever, infections).     Do not wear jewelry, make-up, hairpins, clips or nail polish.  Do not wear lotions, powders, or perfumes. You may wear deodorant.  Do not shave 48 hours prior to surgery. Men may shave face and neck.  Do not bring valuables to the hospital.    [facility] is not responsible for any belongings or valuables.               Contacts, dentures or bridgework may not be worn into surgery.  Leave your suitcase in the car. After surgery it may be brought to your room.  For patients admitted to the hospital, discharge time is determined by your treatment team.   Patients discharged the day of surgery will not be allowed to drive home.    Please read over the following fact sheets that you were given:   [facility] Preparing for Surgery and or MRSA Information   _x___ Take these medicines the morning of surgery with A SIP OF WATER:    1. albuterol (PROVENTIL HFA;VENTOLIN HFA) 108 (90 BASE) MCG/ACT inhaler  2.amLODipine (NORVASC) 5 MG tablet  3.metoprolol (LOPRESSOR) 100 MG tablet  4.  5.  6.  ____ Fleet Enema (as directed)   _x___ Use CHG Soap or sage wipes as directed on instruction sheet   ____  Use inhalers on the day of surgery and bring to hospital day of surgery  ____ Stop metformin 2 days prior to surgery    ____ Take 1/2 of usual insulin dose the night before surgery and none on the morning of           surgery.   __x__ Stop aspirin or coumadin, or plavix   Stop aspirin and plavix 5 days before surgery per Dr   _x__ Stop Anti-inflammatories such as Advil, Aleve, Ibuprofen, Motrin, Naproxen,          Naprosyn, Goodies powders or aspirin products. Ok to take Tylenol.   ____ Stop supplements until after surgery.    ____ Bring C-Pap to the hospital.

## 2016-01-13 NOTE — Pre-Procedure Instructions (Addendum)
Cardiac clearance on chart  Melissa Cowman, MD - 11/28/2015 11:30 AM EDT Formatting of this note may be different from the original. Established Patient Visit   Chief Complaint: Chief Complaint  Patient presents with  . Follow-up  cath  . Chest Pain  yes today  . Shortness of Breath  with excertion  . Edema  legs and feet  Date of Service: 11/28/2015 Date of Birth: 01/04/1936 PCP: Armando Reichert, MD  History of Present Illness: Melissa Cochran is a 80 y.o.female patient who returns for  1. Stent mid left circumflex 04/07/2010 2. Essential hypertension 3. Hyperlipidemia 4. Venous insufficiency  Patient was recently seen 10/30/2015 for preoperative cardiovascular evaluation prior to transurethral resection of bladder tumor. 2D echocardiogram was performed 10/10/2015 which revealed normal left ventricular function with LV ejection fraction of 55-60%, with evidence of mild to moderate pulmonary hypertension. Lexiscan sestamibi study 10/10/2015 was performed. Gated scintigraphy revealed LV ejection fraction of 67%. SPECT imaging revealed a mild apical-lateral reversible defect consistent with ischemia.  The patient underwent cardiac catheterization on 10/21/2015 which revealed patent stent in mid left circumflex and high-grade stenosis first obtuse marginal branch. The patient underwent successful bare-metal stent with an excellent angiographic result. She reports resolution of exertional chest pain. She has chronic exertional dyspnea. She denies palpitations or heart racing. She has chronic peripheral edema. The patient is not very active and not exercising regularly. She has not experienced any hematuria on dual antiplatelet therapy.  The patient has essential hypertension, systolic blood pressure mildly elevated on amlodipine, hydrochlorothiazide and metoprolol tartrate, which are well tolerated without apparent side effects. The patient follows a low sodium, no added salt diet.  The  patient has hyperlipidemia, currently diet control. The patient follows a low-cholesterol, low-fat diet  Past Medical and Surgical History  Past Medical History Past Medical History:  Diagnosis Date  . GERD (gastroesophageal reflux disease)  . Hyperlipidemia  . Hypertension   Past Surgical History She has a past surgical history that includes Abdominal hysterectomy; Cardiac catheterization; and Cemented hemiarthroplasty (Left).   Medications and Allergies  Current Medications  Current Outpatient Prescriptions  Medication Sig Dispense Refill  . acetaminophen (TYLENOL) 325 MG tablet Take 650 mg by mouth every 4 (four) hours as needed for Pain.  Marland Kitchen albuterol 90 mcg/actuation inhaler Inhale into the lungs.  Marland Kitchen amLODIPine (NORVASC) 5 MG tablet Take 1 tablet (5 mg total) by mouth once daily. 30 tablet 11  . aspirin 325 MG EC tablet Take 325 mg by mouth once daily.  . clopidogrel (PLAVIX) 75 mg tablet Take 75 mg by mouth once daily.  . FUROsemide (LASIX) 20 MG tablet Take by mouth.  . gabapentin (NEURONTIN) 100 MG capsule Take by mouth.  . hydrochlorothiazide (HYDRODIURIL) 12.5 MG tablet Take 1 tablet (12.5 mg total) by mouth once daily. 30 tablet 6  . levoFLOXacin (LEVAQUIN) 750 MG tablet Take by mouth.  . metoprolol tartrate (LOPRESSOR) 100 MG tablet Take 1 tablet (100 mg total) by mouth once daily. 60 tablet 6  . nitrofurantoin, macrocrystal-monohydrate, (MACROBID) 100 MG capsule Take by mouth.  . potassium chloride (KLOR-CON) 10 MEQ ER tablet Take by mouth.  . predniSONE (DELTASONE) 50 MG tablet Take one tablet with breakfast for 3 days and then stop.   No current facility-administered medications for this visit.   Allergies: Review of patient's allergies indicates no known allergies.  Social and Family History  Social History reports that she has quit smoking. Her smoking use included Cigarettes. She does not  have any smokeless tobacco history on file. She reports that she does not  drink alcohol or use illicit drugs.  Family History Family History  Problem Relation Age of Onset  . Stroke Father  . Cancer Mother   Review of Systems   Review of Systems: The patient denies chest pain, reports chronic exertional shortness of breath, without orthopnea, paroxysmal nocturnal dyspnea, pedal edema, palpitations, heart racing, presyncope, syncope. Review of 12 Systems is negative except as described above.  Physical Examination   Vitals: BP (P) 142/62  Pulse (P) 62  Ht (P) 172.7 cm (5\' 8" )  Wt (P) 71.5 kg (157 lb 9.6 oz)  BMI (P) 23.96 kg/m2 Ht:(P) 172.7 cm (5\' 8" ) Wt:(P) 71.5 kg (157 lb 9.6 oz) [id number] surface area is 1.85 meters squared (pended). Body mass index is 23.96 kg/(m^2) (pended).  HEENT: Pupils equally reactive to light and accomodation  Neck: Supple without thyromegaly, carotid pulses 2+ Lungs: clear to auscultation bilaterally; no wheezes, rales, rhonchi Heart: Regular rate and rhythm. No gallops, murmurs or rub Abdomen: soft nontender, nondistended, with normal bowel sounds Extremities: Trace to 1+ bilateral pedal edema Peripheral Pulses: 2+ in all extremities, 2+ femoral pulses bilaterally  Assessment   80 y.o. female with  1. Venous insufficiency (chronic) (peripheral)  2. Essential hypertension  3. SOB (shortness of breath) on exertion  4. S/P coronary artery stent placement  5. Pure hypercholesterolemia  6. H/O cardiac catheterization   80 year old female with known coronary artery disease, with recurrent episodes of exertional chest pain, resolved after a metal stent of first obtuse marginal branch on 10/21/2015. The patient has essential hypertension, systolic blood pressure mildly elevated on current BP medications. The patient is awaiting surgery for bladder tumor.  Plan   1. Continue current medications 2. Counseled patient about low-sodium diet 3. DASH diet printed instructions given to the patient 4 . Counseled patient about  low-cholesterol diet 5. Low-fat and cholesterol diet printed instructions given to the patient 6. Instructed patient to call her urologist, to proceed with bladder tumor surgery 7. Plan to hold Plavix 5 days prior to bladder tumor surgery 8. Return to clinic for follow-up in 3 months  No orders of the defined types were placed in this encounter.  Return in about 3 months (around 02/28/2016).  Melissa Cowman, MD

## 2016-01-16 LAB — CULTURE, URINE COMPREHENSIVE

## 2016-01-20 ENCOUNTER — Ambulatory Visit: Payer: Medicare Other | Admitting: Anesthesiology

## 2016-01-20 ENCOUNTER — Encounter: Payer: Self-pay | Admitting: Urology

## 2016-01-20 ENCOUNTER — Encounter
Admission: RE | Disposition: A | Discharge status: Home / Self Care | Payer: Self-pay | Source: Ambulatory Visit | Attending: Urology

## 2016-01-20 ENCOUNTER — Ambulatory Visit
Admission: RE | Admit: 2016-01-20 | Discharge: 2016-01-20 | Disposition: A | Discharge status: Home / Self Care | Payer: Medicare Other | Source: Ambulatory Visit | Attending: Urology | Admitting: Urology

## 2016-01-20 DIAGNOSIS — Z87891 Personal history of nicotine dependence: Secondary | ICD-10-CM | POA: Diagnosis not present

## 2016-01-20 DIAGNOSIS — R911 Solitary pulmonary nodule: Secondary | ICD-10-CM | POA: Diagnosis not present

## 2016-01-20 DIAGNOSIS — Z79899 Other long term (current) drug therapy: Secondary | ICD-10-CM | POA: Diagnosis not present

## 2016-01-20 DIAGNOSIS — I455 Other specified heart block: Secondary | ICD-10-CM | POA: Insufficient documentation

## 2016-01-20 DIAGNOSIS — R311 Benign essential microscopic hematuria: Secondary | ICD-10-CM | POA: Diagnosis not present

## 2016-01-20 DIAGNOSIS — C670 Malignant neoplasm of trigone of bladder: Secondary | ICD-10-CM | POA: Diagnosis not present

## 2016-01-20 DIAGNOSIS — Z823 Family history of stroke: Secondary | ICD-10-CM | POA: Diagnosis not present

## 2016-01-20 DIAGNOSIS — Z90710 Acquired absence of both cervix and uterus: Secondary | ICD-10-CM | POA: Insufficient documentation

## 2016-01-20 DIAGNOSIS — I10 Essential (primary) hypertension: Secondary | ICD-10-CM | POA: Insufficient documentation

## 2016-01-20 DIAGNOSIS — E785 Hyperlipidemia, unspecified: Secondary | ICD-10-CM | POA: Insufficient documentation

## 2016-01-20 DIAGNOSIS — C679 Malignant neoplasm of bladder, unspecified: Secondary | ICD-10-CM | POA: Diagnosis not present

## 2016-01-20 DIAGNOSIS — N3941 Urge incontinence: Secondary | ICD-10-CM | POA: Insufficient documentation

## 2016-01-20 DIAGNOSIS — Z800 Family history of malignant neoplasm of digestive organs: Secondary | ICD-10-CM | POA: Insufficient documentation

## 2016-01-20 DIAGNOSIS — N3021 Other chronic cystitis with hematuria: Secondary | ICD-10-CM | POA: Diagnosis not present

## 2016-01-20 DIAGNOSIS — Z8711 Personal history of peptic ulcer disease: Secondary | ICD-10-CM | POA: Diagnosis not present

## 2016-01-20 HISTORY — PX: TRANSURETHRAL RESECTION OF BLADDER TUMOR: SHX2575

## 2016-01-20 HISTORY — PX: CYSTOSCOPY WITH STENT PLACEMENT: SHX5790

## 2016-01-20 HISTORY — PX: CYSTOSCOPY W/ RETROGRADES: SHX1426

## 2016-01-20 SURGERY — TURBT (TRANSURETHRAL RESECTION OF BLADDER TUMOR)
Anesthesia: General | Laterality: Right | Wound class: Clean Contaminated

## 2016-01-20 MED ORDER — HYDROCODONE-ACETAMINOPHEN 5-325 MG PO TABS
1.0000 | ORAL_TABLET | ORAL | Status: DC | PRN
  Administered 2016-01-20: 1 via ORAL

## 2016-01-20 MED ORDER — OXYBUTYNIN CHLORIDE 5 MG PO TABS: 5.0000 mg | ORAL_TABLET | Freq: Three times a day (TID) | ORAL | 0 refills | Status: DC | PRN

## 2016-01-20 MED ORDER — LABETALOL HCL 5 MG/ML IV SOLN
5.0000 mg | INTRAVENOUS | Status: AC | PRN
  Administered 2016-01-20 (×4): 5 mg via INTRAVENOUS

## 2016-01-20 MED ORDER — DEXAMETHASONE SODIUM PHOSPHATE 10 MG/ML IJ SOLN
INTRAMUSCULAR | Status: DC | PRN
  Administered 2016-01-20: 8 mg via INTRAVENOUS

## 2016-01-20 MED ORDER — IPRATROPIUM-ALBUTEROL 0.5-2.5 (3) MG/3ML IN SOLN
RESPIRATORY_TRACT | Status: AC
  Administered 2016-01-20: 3 mL via RESPIRATORY_TRACT
  Filled 2016-01-20: qty 3

## 2016-01-20 MED ORDER — LACTATED RINGERS IV SOLN
INTRAVENOUS | Status: DC
  Administered 2016-01-20 (×2): via INTRAVENOUS

## 2016-01-20 MED ORDER — IPRATROPIUM-ALBUTEROL 0.5-2.5 (3) MG/3ML IN SOLN
3.0000 mL | Freq: Once | RESPIRATORY_TRACT | Status: AC
  Administered 2016-01-20: 3 mL via RESPIRATORY_TRACT

## 2016-01-20 MED ORDER — CEFAZOLIN IN D5W 1 GM/50ML IV SOLN
1.0000 g | INTRAVENOUS | Status: AC
  Administered 2016-01-20: 1 g via INTRAVENOUS

## 2016-01-20 MED ORDER — LIDOCAINE HCL (CARDIAC) 20 MG/ML IV SOLN
INTRAVENOUS | Status: DC | PRN
  Administered 2016-01-20: 40 mg via INTRAVENOUS

## 2016-01-20 MED ORDER — HYDROCODONE-ACETAMINOPHEN 5-325 MG PO TABS
ORAL_TABLET | ORAL | Status: AC
  Filled 2016-01-20: qty 1

## 2016-01-20 MED ORDER — OXYBUTYNIN CHLORIDE 5 MG PO TABS
5.0000 mg | ORAL_TABLET | Freq: Three times a day (TID) | ORAL | Status: DC | PRN
  Filled 2016-01-20: qty 1

## 2016-01-20 MED ORDER — PROPOFOL 10 MG/ML IV BOLUS
INTRAVENOUS | Status: DC | PRN
  Administered 2016-01-20: 80 mg via INTRAVENOUS

## 2016-01-20 MED ORDER — CEFAZOLIN IN D5W 1 GM/50ML IV SOLN
INTRAVENOUS | Status: AC
  Administered 2016-01-20: 1 g via INTRAVENOUS
  Filled 2016-01-20: qty 50

## 2016-01-20 MED ORDER — ONDANSETRON HCL 4 MG/2ML IJ SOLN
INTRAMUSCULAR | Status: DC | PRN
  Administered 2016-01-20: 4 mg via INTRAVENOUS

## 2016-01-20 MED ORDER — LIDOCAINE HCL 2 % EX GEL
CUTANEOUS | Status: DC | PRN
  Administered 2016-01-20: 1 via TOPICAL

## 2016-01-20 MED ORDER — FAMOTIDINE 20 MG PO TABS
20.0000 mg | ORAL_TABLET | Freq: Once | ORAL | Status: AC
  Administered 2016-01-20: 20 mg via ORAL

## 2016-01-20 MED ORDER — LABETALOL HCL 5 MG/ML IV SOLN
INTRAVENOUS | Status: AC
  Administered 2016-01-20: 5 mg via INTRAVENOUS
  Filled 2016-01-20: qty 4

## 2016-01-20 MED ORDER — DOCUSATE SODIUM 100 MG PO CAPS: 100.0000 mg | ORAL_CAPSULE | Freq: Two times a day (BID) | ORAL | 0 refills | Status: DC

## 2016-01-20 MED ORDER — IOTHALAMATE MEGLUMINE 43 % IV SOLN
INTRAVENOUS | Status: DC | PRN
  Administered 2016-01-20: 50 mL

## 2016-01-20 MED ORDER — MIDAZOLAM HCL 2 MG/2ML IJ SOLN
INTRAMUSCULAR | Status: DC | PRN
  Administered 2016-01-20: .5 mg via INTRAVENOUS

## 2016-01-20 MED ORDER — FENTANYL CITRATE (PF) 100 MCG/2ML IJ SOLN
INTRAMUSCULAR | Status: DC | PRN
  Administered 2016-01-20 (×3): 25 ug via INTRAVENOUS
  Administered 2016-01-20: 50 ug via INTRAVENOUS
  Administered 2016-01-20 (×3): 25 ug via INTRAVENOUS

## 2016-01-20 MED ORDER — FAMOTIDINE 20 MG PO TABS
ORAL_TABLET | ORAL | Status: AC
Start: 2016-01-20 — End: 2016-01-20
  Administered 2016-01-20: 20 mg via ORAL
  Filled 2016-01-20: qty 1

## 2016-01-20 MED ORDER — MITOMYCIN CHEMO FOR BLADDER INSTILLATION 40 MG
40.0000 mg | Freq: Once | INTRAVENOUS | Status: DC
  Filled 2016-01-20: qty 40

## 2016-01-20 MED ORDER — HYDROCODONE-ACETAMINOPHEN 5-325 MG PO TABS
1.0000 | ORAL_TABLET | Freq: Four times a day (QID) | ORAL | 0 refills | Status: DC | PRN
Start: 2016-01-20 — End: 2016-01-23

## 2016-01-20 SURGICAL SUPPLY — 34 items
BAG DRAIN CYSTO-URO LG1000N (MISCELLANEOUS) IMPLANT
BAG URO DRAIN 2000ML W/SPOUT (MISCELLANEOUS) ×5 IMPLANT
CATH FOLEY 2WAY  5CC 16FR (CATHETERS) ×2
CATH URETL 5X70 OPEN END (CATHETERS) ×5 IMPLANT
CATH URTH 16FR FL 2W BLN LF (CATHETERS) ×3 IMPLANT
CONRAY 43 FOR UROLOGY 50M (MISCELLANEOUS) ×5 IMPLANT
CORD URO TURP 10FT (MISCELLANEOUS) ×5 IMPLANT
DRAPE UTILITY 15X26 TOWEL STRL (DRAPES) ×5 IMPLANT
ELECT LOOP 22F BIPOLAR SML (ELECTROSURGICAL) ×5
ELECT REM PT RETURN 9FT ADLT (ELECTROSURGICAL)
ELECTRODE LOOP 22F BIPOLAR SML (ELECTROSURGICAL) ×3 IMPLANT
ELECTRODE REM PT RTRN 9FT ADLT (ELECTROSURGICAL) IMPLANT
GLOVE BIO SURGEON STRL SZ 6.5 (GLOVE) ×12 IMPLANT
GLOVE BIO SURGEONS STRL SZ 6.5 (GLOVE) ×3
GOWN STRL REUS W/ TWL LRG LVL3 (GOWN DISPOSABLE) ×6 IMPLANT
GOWN STRL REUS W/TWL LRG LVL3 (GOWN DISPOSABLE) ×4
IV NS 1000ML (IV SOLUTION) ×14
IV NS 1000ML BAXH (IV SOLUTION) ×21 IMPLANT
KIT RM TURNOVER CYSTO AR (KITS) ×5 IMPLANT
LOOP CUT BIPOLAR 24F LRG (ELECTROSURGICAL) IMPLANT
PACK CYSTO AR (MISCELLANEOUS) ×5 IMPLANT
PLUG CATH AND CAP STER (CATHETERS) ×5 IMPLANT
PREP PVP WINGED SPONGE (MISCELLANEOUS) IMPLANT
SENSORWIRE 0.038 NOT ANGLED (WIRE) ×5
SET CYSTO W/LG BORE CLAMP LF (SET/KITS/TRAYS/PACK) ×5 IMPLANT
SET IRRIG Y TYPE TUR BLADDER L (SET/KITS/TRAYS/PACK) ×10 IMPLANT
SET IRRIGATING DISP (SET/KITS/TRAYS/PACK) ×5 IMPLANT
SOL .9 NS 3000ML IRR  AL (IV SOLUTION) ×22
SOL .9 NS 3000ML IRR UROMATIC (IV SOLUTION) ×33 IMPLANT
STENT URET 6FRX24 CONTOUR (STENTS) ×5 IMPLANT
SURGILUBE 2OZ TUBE FLIPTOP (MISCELLANEOUS) ×5 IMPLANT
SYRINGE IRR TOOMEY STRL 70CC (SYRINGE) ×5 IMPLANT
WATER STERILE IRR 1000ML POUR (IV SOLUTION) ×5 IMPLANT
WIRE SENSOR 0.038 NOT ANGLED (WIRE) ×3 IMPLANT

## 2016-01-20 NOTE — Transfer of Care (Signed)
Immediate Anesthesia Transfer of Care Note  Patient: Melissa Cochran  Procedure(s) Performed: Procedure(s): TRANSURETHRAL RESECTION OF BLADDER TUMOR (TURBT) (N/A) CYSTOSCOPY WITH RETROGRADE PYELOGRAM (Bilateral) CYSTOSCOPY WITH STENT PLACEMENT (Right)  Patient Location: PACU  Anesthesia Type:General  Level of Consciousness: awake  Airway & Oxygen Therapy: Patient Spontanous Breathing and Patient connected to face mask oxygen  Post-op Assessment: Report given to RN and Post -op Vital signs reviewed and stable  Post vital signs: Reviewed and stable  Last Vitals:  Vitals:   01/20/16 0631 01/20/16 0959  BP: (!) 187/96   Pulse: 86 (P) 91  Resp: 20 (P) 20  Temp: 36.4 C (P) 36.8 C    Last Pain:  Vitals:   01/20/16 0631  TempSrc: Tympanic         Complications: No apparent anesthesia complications

## 2016-01-20 NOTE — Anesthesia Postprocedure Evaluation (Signed)
Anesthesia Post Note  Patient: Melissa Cochran  Procedure(s) Performed: Procedure(s) (LRB): TRANSURETHRAL RESECTION OF BLADDER TUMOR (TURBT) (N/A) CYSTOSCOPY WITH RETROGRADE PYELOGRAM (Bilateral) CYSTOSCOPY WITH STENT PLACEMENT (Right)  Patient location during evaluation: PACU Anesthesia Type: General Level of consciousness: awake and alert Pain management: pain level controlled Vital Signs Assessment: post-procedure vital signs reviewed and stable Respiratory status: spontaneous breathing, nonlabored ventilation, respiratory function stable and patient connected to nasal cannula oxygen Cardiovascular status: blood pressure returned to baseline and stable Postop Assessment: no signs of nausea or vomiting Anesthetic complications: no    Last Vitals:  Vitals:   01/20/16 0959 01/20/16 1005  BP: (!) 215/83 (!) 214/92  Pulse: 91   Resp: 20 18  Temp: 36.8 C     Last Pain:  Vitals:   01/20/16 0959  TempSrc:   PainSc: Asleep                 Molli Barrows

## 2016-01-20 NOTE — Op Note (Signed)
Date of procedure: 01/20/16  Preoperative diagnosis:  1. Bladder tumor 2.  Microscopic hematuria  Postoperative diagnosis:  1. Same as above  2. Labial adhesion  Procedure: 1. Transurethral resection of the bladder tumor, greater than 5 cm 2. Bilateral retrograde pyelogram 3. Right ureteral stent placement   Surgeon: Hollice Espy, MD  Anesthesia: General  Complications: None  Intraoperative findings: 5 cm papillary and nodular tumor on the right lateral trigone/lateral wall. UO initially not seen on right, visualized following resection.  Bilateral retrograde pyelogram unremarkable.   EBL: minimal  Specimens: bladder tumor  Drains: 16 Fr Foley, 6 x 24 Fr JJ ureteral stent on right  Indication: Melissa Cochran is a 80 y.o. patient with with microscopic hematuria and bladder tumor identified on CT scan and office cystoscopy. She was previously booked for TURBT which has been delayed due to need for percutaneous intervention secondary to unstable angina.  After reviewing the management options for treatment, she elected to proceed with the above surgical procedure(s). We have discussed the potential benefits and risks of the procedure, side effects of the proposed treatment, the likelihood of the patient achieving the goals of the procedure, and any potential problems that might occur during the procedure or recuperation. Informed consent has been obtained.  Description of procedure:  The patient was taken to the operating room and general anesthesia was induced.  The patient was placed in the dorsal lithotomy position, prepped and draped in the usual sterile fashion, and preoperative antibiotics were administered. A preoperative time-out was performed.   At this point in time, the external genitalia was visualized at which time she was noted to have labial adhesions.  The urethral meatus was visualized and visibly normal.    A 26 French resectoscope was advanced per urethra  into the bladder. The bladder was carefully inspected. This revealed a large papillary tumor on the right lateral wall measuring approximately 5 cm with superficial papillary component. The trigone was inspected the left UO was clearly visible with clear reflux of urine. On the right side however, it appeared that the tumor may be involving the right ureteral orifice or perhaps just on the edge. The remainder of the bladder was fairly unremarkable although mildly erythematous in a few areas. At this point in time, using the laser bridge, a left-sided retrograde pyelogram was performed by inserting a 5 [place] open-ended ureteral catheter just within the UO. Contrast was gently injected just within the UO at which time the left ureter and upper tract collecting system appeared unremarkable. There were no filling defects or hydronephrosis. Attention was turned to the right side but again at this point in time, the right UO was unable to be visualized.  Using saline as a medium, an electrocautery loop was used to carefully take down the right lateral wall tumor down to the level of the muscularis.  Care was taken to avoid deep resection of bladder perforation. The tumor itself was solid in appearance and hypervascular. Upon resection, it was noted that the tumor was sitting just on the edge of the right ureteral orifice but did not appear to be directly involved.  Area around the UO was carefully resected at which point some mild edema of the mucosa just within the UO was appreciated. A gentle retrograde pyelogram was then performed on this side revealing no evidence of filling defects or hydronephrosis. Initially, the lower pole of the collecting system did not fill, however, upon advancing the open-ended ureteral catheter to the proximal  ureter, this area filled nicely and there was no obvious abnormalities on the side.  At this point in time, bladder chips are evacuated from the bladder using a Toomey syringe.  Careful hemostasis was achieved within the bed of the tumor. Given the edema just at the mouth of the right UO although it had not been resected, there was some concern for possible obstruction. As such, a 6 x 24 French double-J ureteral stent was advanced through the UO up to level of the kidney. The wire was withdrawn until full coil was noted within the renal pelvis. The wire was then fully withdrawn and a full coil was noted within the bladder. The bladder was then drained. Upon refilling, there is no evidence of bladder perforation or ongoing active hematuria. The stent appeared to be in good position. There were no residual bladder chips. At this time, the procedure was deemed complete. A 16 French Foley catheter was placed, the patient was cleaned and dried, reversed from anesthesia, taken to the PACU in stable condition  Given concern for obvious malignancy, 40 mg of mitomycin was instilled into the patient's bladder and allowed to dwell in the PACU for 1 hour in duration. Once complete, the chemotherapy was drained and the Foley catheter was removed.  Plan: Patient will follow-up in 7-10 days for cystoscopy, right stent removal pending pathology results. These will be discussed at her follow-up as well.  Hollice Espy, M.D.

## 2016-01-20 NOTE — Discharge Instructions (Signed)
Transurethral Resection of Bladder Tumor (TURBT) or Bladder Biopsy   Definition:  Transurethral Resection of the Bladder Tumor is a surgical procedure used to diagnose and remove tumors within the bladder. TURBT is the most common treatment for early stage bladder cancer.  General instructions:     Your recent bladder surgery requires very little post hospital care but some definite precautions.  Despite the fact that no skin incisions were used, the area around the bladder incisions are raw and covered with scabs to promote healing and prevent bleeding. Certain precautions are needed to insure that the scabs are not disturbed over the next 2-4 weeks while the healing proceeds.  Because the raw surface inside your bladder and the irritating effects of urine you may expect frequency of urination and/or urgency (a stronger desire to urinate) and perhaps even getting up at night more often. This will usually resolve or improve slowly over the healing period. You may see some blood in your urine over the first 6 weeks. Do not be alarmed, even if the urine was clear for a while. Get off your feet and drink lots of fluids until clearing occurs. If you start to pass clots or don't improve call us.  Diet:  You may return to your normal diet immediately. Because of the raw surface of your bladder, alcohol, spicy foods, foods high in acid and drinks with caffeine may cause irritation or frequency and should be used in moderation. To keep your urine flowing freely and avoid constipation, drink plenty of fluids during the day (8-10 glasses). Tip: Avoid cranberry juice because it is very acidic.  Activity:  Your physical activity doesn't need to be restricted. However, if you are very active, you may see some blood in the urine. We suggest that you reduce your activity under the circumstances until the bleeding has stopped.  Bowels:  It is important to keep your bowels regular during the postoperative  period. Straining with bowel movements can cause bleeding. A bowel movement every other day is reasonable. Use a mild laxative if needed, such as milk of magnesia 2-3 tablespoons, or 2 Dulcolax tablets. Call if you continue to have problems. If you had been taking narcotics for pain, before, during or after your surgery, you may be constipated. Take a laxative if necessary.    Medication:  You should resume your pre-surgery medications unless told not to. In addition you may be given an antibiotic to prevent or treat infection. Antibiotics are not always necessary. All medication should be taken as prescribed until the bottles are finished unless you are having an unusual reaction to one of the drugs.  You may restart aspirin today but continue to hold plavix for 3 days if urine remains bloody.  If urine clear, may resume both tomorrow.     [facility] [address] [place], [place] [address] [phone]   AMBULATORY SURGERY  DISCHARGE INSTRUCTIONS   1) The drugs that you were given will stay in your system until tomorrow so for the next 24 hours you should not:  A) Drive an automobile B) Make any legal decisions C) Drink any alcoholic beverage   2) You may resume regular meals tomorrow.  Today it is better to start with liquids and gradually work up to solid foods.  You may eat anything you prefer, but it is better to start with liquids, then soup and crackers, and gradually work up to solid foods.   3) Please notify your doctor  immediately if you have any unusual bleeding, trouble breathing, redness and pain at the surgery site, drainage, fever, or pain not relieved by medication.    4) Additional Instructions:        Please contact your physician with any problems or Same Day Surgery at [phone], Monday through Friday 6 am to 4 pm, or [place] at [facility] number at [phone].

## 2016-01-20 NOTE — Anesthesia Preprocedure Evaluation (Signed)
Anesthesia Evaluation  Patient identified by MRN, date of birth, ID band Patient awake    Reviewed: Allergy & Precautions, H&P , NPO status , Patient's Chart, lab work & pertinent test results, reviewed documented beta blocker date and time   Airway Mallampati: III  TM Distance: >3 FB Neck ROM: full    Dental  (+) Teeth Intact   Pulmonary neg pulmonary ROS, shortness of breath, at rest and lying, pneumonia, resolved, COPD,  COPD inhaler, former smoker,    Pulmonary exam normal        Cardiovascular Exercise Tolerance: Good hypertension, + CAD, + Peripheral Vascular Disease, +CHF and + Orthopnea  negative cardio ROS Normal cardiovascular exam+ dysrhythmias  Rate:Normal     Neuro/Psych  Neuromuscular disease negative neurological ROS  negative psych ROS   GI/Hepatic negative GI ROS, Neg liver ROS, PUD, GERD  Medicated,  Endo/Other  negative endocrine ROS  Renal/GU Renal diseasenegative Renal ROS  negative genitourinary   Musculoskeletal   Abdominal   Peds  Hematology negative hematology ROS (+)   Anesthesia Other Findings   Reproductive/Obstetrics negative OB ROS                             Anesthesia Physical Anesthesia Plan  ASA: III  Anesthesia Plan: General LMA   Post-op Pain Management:    Induction:   Airway Management Planned:   Additional Equipment:   Intra-op Plan:   Post-operative Plan:   Informed Consent: I have reviewed the patients History and Physical, chart, labs and discussed the procedure including the risks, benefits and alternatives for the proposed anesthesia with the patient or authorized representative who has indicated his/her understanding and acceptance.     Plan Discussed with: CRNA  Anesthesia Plan Comments: (Room air sats 86 with inc. Work of breathing.  Will provide neb treatment to see if pos. Response and proceed from there.  JA)         Anesthesia Quick Evaluation

## 2016-01-20 NOTE — H&P (Signed)
Patient seen and examined on 01/20/16 prior to surgery.  Since initial evaluation in the office, she was sent for cardiac clearance leading identification of the need for cardiac revascularization (due to unstable angina) in the form of PCI by Dr. Saralyn Pilar.  She is now s/p cardiac clerance.  She was able to hold her plavix for 7 days and ASA for 3 days per her cardiologist recommendations.    Preop urine culture 9/5 25-50K mixed flora.  RRR/ CTAB.    Hollice Espy, MD      Previous H&:  08/26/2015 11:14 AM   Melissa Cochran Nov 29, 1935 [id number]  Referring provider: [name], MD [address] [place], [place] [address]      Chief Complaint  Patient presents with  . New Patient (Initial Visit)    chronic cystitis w/hematuria    HPI:  1 - Microscopic Hematuria - blood on UA noted at PCP x several. No gross episodes. Former 40PY smoker now quite. H/o nephrolithiasis as well. Bladder cancer as per below.   2 - Urge Urinary Incontinence - slowly progressive but still modest bother with occasional urge leakage. Manages with pads. Pelvic 2017 w/o prolapse. PVR about "165mL" (modest elevation). FOrmer smoker. She is on Lasix for edema and admittidly "drinks al the time", mostly caffiniated drinks.   3 - Nephrolithiasis - h/o medically passed stones previousliy. CT 09/2015 with 74mm LLP w/o hydro.  4 - Bladder Cancer - Rt trigone approx 5cm papillary mass by CT and cysto 09/2015 on eval hematuria and irritative voiding. No hydro. No pelvic adenopathy or loss of perivesical fat planes.  5 - Left Lower Lobe Lung Nodule - 1.6cm LLL nodule incidental on CT abd 09/2015. Prior 40PY smoker.   PMH sig for benign hyst, gastric ulcer surgery, CHF/Lasix.   Today "Melissa Cochran" is seen for cysto and f/u above with interval Ct that unfortunatley shows bladder cancer and left lower lung mass.   PMH:      Past Medical History  Diagnosis Date  . Hypertension    . Hyperlipemia   . Heart block     left  . Adnexal mass   . Bleeding ulcer     Surgical History:       Past Surgical History  Procedure Laterality Date  . Cardiac surgery      cardiac cath  . Abdominal hysterectomy    . Stomach surgery    . Hip surgery      left    Home Medications:        Medication List       This list is accurate as of: 08/26/15 11:14 AM.  Always use your most recent med list.                acetaminophen 325 MG tablet  Commonly known as:  TYLENOL  Take 650 mg by mouth every 6 (six) hours as needed for mild pain or headache.     albuterol 108 (90 Base) MCG/ACT inhaler  Commonly known as:  PROVENTIL HFA;VENTOLIN HFA  Inhale 2 puffs into the lungs every 6 (six) hours as needed for wheezing or shortness of breath.     clopidogrel 75 MG tablet  Commonly known as:  PLAVIX  Take 1 tablet (75 mg total) by mouth daily.     furosemide 20 MG tablet  Commonly known as:  LASIX  Take 1 tablet (20 mg total) by mouth daily. As needed for leg swelling. Take potassium when  taking furosemide.     gabapentin 100 MG capsule  Commonly known as:  NEURONTIN  Take 1 capsule (100 mg total) by mouth 3 (three) times daily.     levofloxacin 500 MG tablet  Commonly known as:  LEVAQUIN  Take 1 tablet (500 mg total) by mouth daily.     metoprolol 100 MG tablet  Commonly known as:  LOPRESSOR  Take 1 tablet (100 mg total) by mouth 2 (two) times daily.     ondansetron 8 MG disintegrating tablet  Commonly known as:  ZOFRAN ODT  Take 1 tablet (8 mg total) by mouth every 8 (eight) hours as needed for nausea or vomiting.     potassium chloride 10 MEQ tablet  Commonly known as:  K-DUR  Take 1 tablet (10 mEq total) by mouth 2 (two) times a week. Take with doses of furosemide, to keep potassium from getting too low.        Allergies: No Known Allergies  Family History:      Family History  Problem Relation  Age of Onset  . Stomach cancer Mother   . CVA Father   . Bladder Cancer Neg Hx   . Kidney cancer Neg Hx     Social History:  reports that she quit smoking about 17 years ago. She does not have any smokeless tobacco history on file. She reports that she does not drink alcohol or use illicit drugs.  ROS: UROLOGY Frequent Urination?: Yes Hard to postpone urination?: Yes Burning/pain with urination?: No Get up at night to urinate?: Yes Leakage of urine?: Yes Urine stream starts and stops?: Yes Trouble starting stream?: No Do you have to strain to urinate?: No Blood in urine?: No Urinary tract infection?: Yes Sexually transmitted disease?: No Injury to kidneys or bladder?: No Painful intercourse?: No Weak stream?: No Currently pregnant?: No Vaginal bleeding?: No Last menstrual period?: n  Gastrointestinal Nausea?: No Vomiting?: No Indigestion/heartburn?: No Diarrhea?: No Constipation?: No  Constitutional Fever: No Night sweats?: No Weight loss?: No Fatigue?: No  Skin Skin rash/lesions?: No Itching?: No  Eyes Blurred vision?: No Double vision?: No  Ears/Nose/Throat Sore throat?: No Sinus problems?: No  Hematologic/Lymphatic Swollen glands?: No Easy bruising?: Yes  Cardiovascular Leg swelling?: No Chest pain?: Yes  Respiratory Cough?: Yes Shortness of breath?: Yes  Endocrine Excessive thirst?: No  Musculoskeletal Back pain?: No Joint pain?: No  Neurological Headaches?: No Dizziness?: No  Psychologic Depression?: No Anxiety?: No  Physical Exam: BP 150/79 mmHg  Pulse 69  Ht 5\' 8"  (1.727 m)  Wt 154 lb 6.4 oz (70.035 kg)  BMI 23.48 kg/m2  Constitutional:  Alert and oriented, No acute distress. HEENT: [place] AT, moist mucus membranes.  Trachea midline, no masses. Cardiovascular: No clubbing, cyanosis, or edema. Respiratory: Normal respiratory effort, no increased work of breathing. GI: Abdomen is soft, nontender,  nondistended, no abdominal masses. Prior pfannenstiel scar. Upper midline scar. NO hernias.  GU: No CVA tenderness. Vaginal / introital atrophy age appropriate. No prolapse or palpable mass.  Skin: No rashes, bruises or suspicious lesions. Lymph: No cervical or inguinal adenopathy. Neurologic: Grossly intact, no focal deficits, moving all 4 extremities. Psychiatric: Normal mood and affect.  Laboratory Data:      Lab Results  Component Value Date   WBC 7.8 06/27/2015   HGB 13.6 06/27/2015   HCT 42.5 06/27/2015   MCV 88.6 06/27/2015   PLT 274 06/27/2015         Lab Results  Component Value Date  CREATININE 0.52 06/27/2015    No results found for: PSA  No results found for: TESTOSTERONE  No results found for: HGBA1C  Urinalysis         Component Value Date/Time   COLORURINE YELLOW 08/02/2015 1217   COLORURINE YELLOW 01/23/2014 1047   APPEARANCEUR CLOUDY* 08/02/2015 1217   APPEARANCEUR HAZY 01/23/2014 1047   LABSPEC 1.025 08/02/2015 1217   LABSPEC 1.020 01/23/2014 1047   PHURINE 5.5 08/02/2015 1217   PHURINE 6.0 01/23/2014 1047   GLUCOSEU NEGATIVE 08/02/2015 1217   GLUCOSEU NEGATIVE 01/23/2014 1047   HGBUR 3+* 08/02/2015 1217   HGBUR SMALL 01/23/2014 1047   BILIRUBINUR negative 08/04/2015 1140   BILIRUBINUR 1+* 08/02/2015 1217   BILIRUBINUR NEGATIVE 01/23/2014 [place] 08/02/2015 1217   KETONESUR NEGATIVE 01/23/2014 1047   PROTEINUR negative 08/04/2015 1140   PROTEINUR >300* 08/02/2015 1217   PROTEINUR 30 mg/dL 01/23/2014 1047   UROBILINOGEN 0.2 08/04/2015 1140   NITRITE negative 08/04/2015 1140   NITRITE NEGATIVE 08/02/2015 1217   NITRITE NEGATIVE 01/23/2014 1047   LEUKOCYTESUR Negative 08/04/2015 1140   LEUKOCYTESUR TRACE 01/23/2014 1047       Cystoscopy Procedure Note  Patient identification was confirmed, informed consent was obtained, and patient was prepped using Betadine solution.   Lidocaine jelly was administered per urethral meatus.    Preoperative abx where received prior to procedure.    Procedure: - Flexible cystoscope introduced, without any difficulty.   - Thorough search of the bladder revealed:    normal urethral meatus    no stones    no ulcers     no tumors    no urethral polyps  Large papillary Rt mass trigone mass, No additional lesions. Lt UO clearly visualized. Rt UO dificult to visualize.    Post-Procedure: - Patient tolerated the procedure well   Assessment & Plan:    1 - Microscopic Hematuria - likely from bladder cancer and or non-obstructing renal stone as per below.   2 - Urge Urinary Incontinence - suspect this is aggravated by bladder cancer. Will focus efforts on management of this, hopefully after treatment her irritative voiding symptoms will improve.    3 - Nephrolithiasis - current stone burdent relatively small, non-obstructive, and is low risk lower pole location. Do nor rec "pre-emptive" treatment unless she winds up undergoing cystectomy, for which I would rec pre-cystectomy left URS to stone free.   4 - Bladder Cancer - Discussed natural history and likely clnically localized disease,  Though pulm lesion does raise some concer of possible distant disease.  Rec operative TURBT next avail. May need Rt ureteral stent peri-op given as comes very close to Rt UO.  We discussed operative biopsy / transurethral resection as the best next step for diagnostic and therapeutic purposes with goals being to remove all visible cancer and obtain tissue for pathologic exam. We discussed that for some low-grade tumors, this may be all the treatment required, but that for many other tumors such as high-grade lesions, further therapy including surgery and or chemotherapy may be warranted. We also outlined the fact that any bladder cancer diagnosis will require close follow-up with periodic upper and lower tract evaluation. We discussed  risks including bleeding, infection, damage to kidney / ureter / bladder including bladder perforation which can typically managed with prolonged foley catheterization. We mentioned anesthetic and other rare risks including DVT, PE, MI, and mortality. I also mentioned that adjunctive procedures such as ureteral stenting, retrograde pyelography, and ureteroscopy may be  necessary to fully evaluate the urinary tract depending on intra-operative findings. After answering all questions to the patient's satisfaction, they wish to proceed.   5 - Left Lower Lobe Lung Nodule - DDX benign scarring, primary lung neoplasm, metastasis discussed. Will refer Pulm for eval (will likely need PET and / or BX).   Alexis Frock, [facility] [address], [place], [place] [address] [phone]

## 2016-01-20 NOTE — Anesthesia Procedure Notes (Signed)
Procedure Name: LMA Insertion Date/Time: 01/20/2016 7:45 AM Performed by: Aline Brochure Pre-anesthesia Checklist: Patient identified, Emergency Drugs available, Suction available, Patient being monitored and Timeout performed Patient Re-evaluated:Patient Re-evaluated prior to inductionOxygen Delivery Method: Circle system utilized Preoxygenation: Pre-oxygenation with 100% oxygen Intubation Type: IV induction Ventilation: Mask ventilation without difficulty LMA: LMA inserted LMA Size: 4.0 Number of attempts: 1 Placement Confirmation: ETT inserted through vocal cords under direct vision,  positive ETCO2 and breath sounds checked- equal and bilateral Tube secured with: Tape Dental Injury: Teeth and Oropharynx as per pre-operative assessment

## 2016-01-21 ENCOUNTER — Observation Stay
Admission: EM | Admit: 2016-01-21 | Discharge: 2016-01-23 | Disposition: A | Discharge status: Home / Self Care | Payer: Medicare Other | Attending: Internal Medicine | Admitting: Internal Medicine

## 2016-01-21 ENCOUNTER — Encounter: Payer: Self-pay | Admitting: Emergency Medicine

## 2016-01-21 ENCOUNTER — Emergency Department: Payer: Medicare Other

## 2016-01-21 DIAGNOSIS — Z1611 Resistance to penicillins: Secondary | ICD-10-CM | POA: Diagnosis not present

## 2016-01-21 DIAGNOSIS — M6281 Muscle weakness (generalized): Secondary | ICD-10-CM

## 2016-01-21 DIAGNOSIS — N390 Urinary tract infection, site not specified: Secondary | ICD-10-CM | POA: Diagnosis not present

## 2016-01-21 DIAGNOSIS — R319 Hematuria, unspecified: Secondary | ICD-10-CM | POA: Diagnosis not present

## 2016-01-21 DIAGNOSIS — I509 Heart failure, unspecified: Secondary | ICD-10-CM | POA: Diagnosis not present

## 2016-01-21 DIAGNOSIS — R7989 Other specified abnormal findings of blood chemistry: Secondary | ICD-10-CM

## 2016-01-21 DIAGNOSIS — R4182 Altered mental status, unspecified: Secondary | ICD-10-CM

## 2016-01-21 DIAGNOSIS — I872 Venous insufficiency (chronic) (peripheral): Secondary | ICD-10-CM | POA: Insufficient documentation

## 2016-01-21 DIAGNOSIS — K219 Gastro-esophageal reflux disease without esophagitis: Secondary | ICD-10-CM | POA: Diagnosis not present

## 2016-01-21 DIAGNOSIS — Z823 Family history of stroke: Secondary | ICD-10-CM | POA: Insufficient documentation

## 2016-01-21 DIAGNOSIS — R778 Other specified abnormalities of plasma proteins: Secondary | ICD-10-CM

## 2016-01-21 DIAGNOSIS — B9620 Unspecified Escherichia coli [E. coli] as the cause of diseases classified elsewhere: Secondary | ICD-10-CM | POA: Insufficient documentation

## 2016-01-21 DIAGNOSIS — I2510 Atherosclerotic heart disease of native coronary artery without angina pectoris: Secondary | ICD-10-CM | POA: Insufficient documentation

## 2016-01-21 DIAGNOSIS — N200 Calculus of kidney: Secondary | ICD-10-CM | POA: Diagnosis not present

## 2016-01-21 DIAGNOSIS — G9341 Metabolic encephalopathy: Secondary | ICD-10-CM | POA: Diagnosis not present

## 2016-01-21 DIAGNOSIS — B952 Enterococcus as the cause of diseases classified elsewhere: Secondary | ICD-10-CM | POA: Insufficient documentation

## 2016-01-21 DIAGNOSIS — F419 Anxiety disorder, unspecified: Secondary | ICD-10-CM | POA: Diagnosis not present

## 2016-01-21 DIAGNOSIS — Z1639 Resistance to other specified antimicrobial drug: Secondary | ICD-10-CM | POA: Insufficient documentation

## 2016-01-21 DIAGNOSIS — E785 Hyperlipidemia, unspecified: Secondary | ICD-10-CM | POA: Diagnosis not present

## 2016-01-21 DIAGNOSIS — M1990 Unspecified osteoarthritis, unspecified site: Secondary | ICD-10-CM | POA: Diagnosis not present

## 2016-01-21 DIAGNOSIS — J449 Chronic obstructive pulmonary disease, unspecified: Secondary | ICD-10-CM | POA: Insufficient documentation

## 2016-01-21 DIAGNOSIS — I491 Atrial premature depolarization: Secondary | ICD-10-CM | POA: Diagnosis not present

## 2016-01-21 DIAGNOSIS — C670 Malignant neoplasm of trigone of bladder: Secondary | ICD-10-CM | POA: Diagnosis not present

## 2016-01-21 DIAGNOSIS — Z800 Family history of malignant neoplasm of digestive organs: Secondary | ICD-10-CM | POA: Diagnosis not present

## 2016-01-21 DIAGNOSIS — Z809 Family history of malignant neoplasm, unspecified: Secondary | ICD-10-CM | POA: Insufficient documentation

## 2016-01-21 DIAGNOSIS — J984 Other disorders of lung: Secondary | ICD-10-CM | POA: Insufficient documentation

## 2016-01-21 DIAGNOSIS — Z90710 Acquired absence of both cervix and uterus: Secondary | ICD-10-CM | POA: Insufficient documentation

## 2016-01-21 DIAGNOSIS — Z79899 Other long term (current) drug therapy: Secondary | ICD-10-CM | POA: Insufficient documentation

## 2016-01-21 DIAGNOSIS — G629 Polyneuropathy, unspecified: Secondary | ICD-10-CM | POA: Insufficient documentation

## 2016-01-21 DIAGNOSIS — Z87891 Personal history of nicotine dependence: Secondary | ICD-10-CM | POA: Diagnosis not present

## 2016-01-21 DIAGNOSIS — R2681 Unsteadiness on feet: Secondary | ICD-10-CM

## 2016-01-21 DIAGNOSIS — R509 Fever, unspecified: Secondary | ICD-10-CM | POA: Diagnosis not present

## 2016-01-21 DIAGNOSIS — I110 Hypertensive heart disease with heart failure: Secondary | ICD-10-CM | POA: Diagnosis not present

## 2016-01-21 LAB — BASIC METABOLIC PANEL
Anion gap: 7 (ref 5–15)
BUN: 22 mg/dL — AB (ref 6–20)
CHLORIDE: 100 mmol/L — AB (ref 101–111)
CO2: 28 mmol/L (ref 22–32)
CREATININE: 0.8 mg/dL (ref 0.44–1.00)
Calcium: 9.5 mg/dL (ref 8.9–10.3)
GFR calc Af Amer: >60 mL/min (ref >60)
GFR calc non Af Amer: >60 mL/min (ref >60)
GLUCOSE: 103 mg/dL — AB (ref 65–99)
POTASSIUM: 4.8 mmol/L (ref 3.5–5.1)
SODIUM: 135 mmol/L (ref 135–145)

## 2016-01-21 LAB — COMPREHENSIVE METABOLIC PANEL
ALBUMIN: 3.7 g/dL (ref 3.5–5.0)
ALT: 18 U/L (ref 14–54)
AST: 51 U/L — AB (ref 15–41)
Alkaline Phosphatase: 79 U/L (ref 38–126)
Anion gap: 9 (ref 5–15)
BILIRUBIN TOTAL: 1.2 mg/dL (ref 0.3–1.2)
BUN: 21 mg/dL — AB (ref 6–20)
CO2: 25 mmol/L (ref 22–32)
CREATININE: 0.82 mg/dL (ref 0.44–1.00)
Calcium: 9.4 mg/dL (ref 8.9–10.3)
Chloride: 99 mmol/L — ABNORMAL LOW (ref 101–111)
GFR calc Af Amer: >60 mL/min (ref >60)
GFR calc non Af Amer: >60 mL/min (ref >60)
GLUCOSE: 97 mg/dL (ref 65–99)
POTASSIUM: 4.8 mmol/L (ref 3.5–5.1)
Sodium: 133 mmol/L — ABNORMAL LOW (ref 135–145)
TOTAL PROTEIN: 6.6 g/dL (ref 6.5–8.1)

## 2016-01-21 LAB — CBC
HEMATOCRIT: 42.8 % (ref 35.0–47.0)
Hemoglobin: 13.8 g/dL (ref 12.0–16.0)
MCH: 29.9 pg (ref 26.0–34.0)
MCHC: 32.3 g/dL (ref 32.0–36.0)
MCV: 92.6 fL (ref 80.0–100.0)
PLATELETS: 225 10*3/uL (ref 150–440)
RBC: 4.62 MIL/uL (ref 3.80–5.20)
RDW: 13 % (ref 11.5–14.5)
WBC: 12 10*3/uL — ABNORMAL HIGH (ref 3.6–11.0)

## 2016-01-21 LAB — URINALYSIS COMPLETE WITH MICROSCOPIC (ARMC ONLY)
BACTERIA UA: NONE SEEN
Specific Gravity, Urine: 1.016 (ref 1.005–1.030)

## 2016-01-21 LAB — DIFFERENTIAL
BASOS ABS: 0 10*3/uL (ref 0–0.1)
Basophils Relative: 0 %
EOS ABS: 0 10*3/uL (ref 0–0.7)
Eosinophils Relative: 0 %
LYMPHS ABS: 3 10*3/uL (ref 1.0–3.6)
LYMPHS PCT: 24 %
MONOS PCT: 5 %
Monocytes Absolute: 0.7 10*3/uL (ref 0.2–0.9)
NEUTROS ABS: 8.8 10*3/uL — AB (ref 1.4–6.5)
NEUTROS PCT: 71 %

## 2016-01-21 LAB — SURGICAL PATHOLOGY

## 2016-01-21 LAB — TROPONIN I: Troponin I: 0.03 ng/mL (ref <0.03)

## 2016-01-21 MED ORDER — CEFTRIAXONE SODIUM 1 G IJ SOLR
1.0000 g | Freq: Once | INTRAMUSCULAR | Status: AC
  Administered 2016-01-21: 1 g via INTRAVENOUS
  Filled 2016-01-21: qty 10

## 2016-01-21 NOTE — ED Notes (Signed)
Unsuccessful phlebotomy attempt, charge nurse notified.

## 2016-01-21 NOTE — ED Provider Notes (Signed)
[facility] Emergency Department Provider Note   ____________________________________________   First MD Initiated Contact with Patient 01/21/16 2118     (approximate)  I have reviewed the triage vital signs and the nursing notes.   HISTORY  Chief Complaint Post-op Problem    HPI Melissa Cochran is a 80 y.o. female who had a tumor scraped out of her bladder yesterday. Today she was confused and saying things that were not making sense at home. She feels "bad" she cannot describe how she felt bad she's not running a fever she is not coughing she just feels bad . Did not have any chest pain. Family reports she had right facial redness and swelling earlier. Patient has no pain there. Patient is not red they're now. Patient still thinks it's a little bit swollen by cannot really see it.   Past Medical History:  Diagnosis Date  . Adnexal mass   . Anxiety   . Arthritis   . Bladder tumor   . Bleeding ulcer   . CHF (congestive heart failure) ([place])   . CHF (congestive heart failure) ([place])   . COPD (chronic obstructive pulmonary disease) ([place])   . Coronary artery disease   . GERD (gastroesophageal reflux disease)   . Heart block    left  . History of bleeding ulcers   . Hyperlipemia   . Hypertension   . Pneumonia   . Shortness of breath dyspnea     Patient Active Problem List   Diagnosis Date Noted  . S/P coronary artery stent placement 10/30/2015  . Atherosclerotic heart disease of native coronary artery without angina pectoris 10/21/2015  . Preop cardiovascular exam 10/08/2015  . SOB (shortness of breath) on exertion 10/08/2015  . Malignant neoplasm of trigone of bladder ([place]) 09/29/2015  . Mass of lower lobe of left lung, incidetnal on CT 09/2015, smoker.  09/29/2015  . Other disorders of lung 09/29/2015  . Other microscopic hematuria 08/26/2015  . Kidney stone 08/26/2015  . Urge incontinence 08/26/2015  . Pneumonia 01/24/2015  . PNA  (pneumonia) 01/24/2015  . Venous insufficiency of both lower extremities 12/09/2014  . Neuropathy ([place]) 12/09/2014  . Essential (primary) hypertension 12/09/2014  . Polyneuropathy ([place]) 12/09/2014  . Chronic venous insufficiency 12/09/2014  . Adnexal mass 11/28/2013  . History of cardiac catheterization 11/28/2013  . BP (high blood pressure) 11/28/2013  . Hyperlipidemia 11/28/2013  . Other specified postprocedural states 11/28/2013    Past Surgical History:  Procedure Laterality Date  . ABDOMINAL HYSTERECTOMY    . CARDIAC CATHETERIZATION Left 10/21/2015   Procedure: Left Heart Cath and Coronary Angiography;  Surgeon: Isaias Cowman, MD;  Location: [facility];  Service: Cardiovascular;  Laterality: Left;  . CARDIAC CATHETERIZATION N/A 10/21/2015   Procedure: Coronary Stent Intervention;  Surgeon: Isaias Cowman, MD;  Location: [facility];  Service: Cardiovascular;  Laterality: N/A;  . CARDIAC SURGERY     cardiac cath  . CYSTOSCOPY W/ RETROGRADES Bilateral 01/20/2016   Procedure: CYSTOSCOPY WITH RETROGRADE PYELOGRAM;  Surgeon: Hollice Espy, MD;  Location: [facility];  Service: Urology;  Laterality: Bilateral;  . CYSTOSCOPY WITH STENT PLACEMENT Right 01/20/2016   Procedure: CYSTOSCOPY WITH STENT PLACEMENT;  Surgeon: Hollice Espy, MD;  Location: [facility];  Service: Urology;  Laterality: Right;  . HIP SURGERY     left  . STOMACH SURGERY    . TRANSURETHRAL RESECTION OF BLADDER TUMOR N/A 01/20/2016   Procedure: TRANSURETHRAL RESECTION OF BLADDER TUMOR (TURBT);  Surgeon: Caryl Pina  Erlene Quan, MD;  Location: [facility];  Service: Urology;  Laterality: N/A;    Prior to Admission medications   Medication Sig Start Date End Date Taking? Authorizing Provider  acetaminophen (TYLENOL) 325 MG tablet Take 650 mg by mouth every 6 (six) hours as needed for mild pain or headache.    Yes Historical Provider, MD  albuterol (PROVENTIL HFA;VENTOLIN HFA) 108 (90 BASE) MCG/ACT inhaler  Inhale 2 puffs into the lungs every 6 (six) hours as needed for wheezing or shortness of breath. 01/28/15  Yes Aldean Jewett, MD  amLODipine (NORVASC) 5 MG tablet Take 5 mg by mouth daily.   Yes Historical Provider, MD  aspirin 81 MG chewable tablet Chew 1 tablet (81 mg total) by mouth daily. 10/22/15  Yes Isaias Cowman, MD  clopidogrel (PLAVIX) 75 MG tablet Take 1 tablet (75 mg total) by mouth daily. 02/24/15  Yes [name], MD  docusate sodium (COLACE) 100 MG capsule Take 1 capsule (100 mg total) by mouth 2 (two) times daily. 01/20/16  Yes Hollice Espy, MD  hydrochlorothiazide (MICROZIDE) 12.5 MG capsule Take 12.5 mg by mouth daily.   Yes Historical Provider, MD  HYDROcodone-acetaminophen (NORCO/VICODIN) 5-325 MG tablet Take 1-2 tablets by mouth every 6 (six) hours as needed for moderate pain. 01/20/16  Yes Hollice Espy, MD  metoprolol (LOPRESSOR) 100 MG tablet Take 1 tablet (100 mg total) by mouth 2 (two) times daily. 10/31/15  Yes [name], MD  oxybutynin (DITROPAN) 5 MG tablet Take 1 tablet (5 mg total) by mouth every 8 (eight) hours as needed for bladder spasms. 01/20/16  Yes Hollice Espy, MD    Allergies Review of patient's allergies indicates no known allergies.  Family History  Problem Relation Age of Onset  . Stomach cancer Mother   . CVA Father   . Bladder Cancer Neg Hx   . Kidney cancer Neg Hx     Social History Social History  Substance Use Topics  . Smoking status: Former Smoker    Packs/day: 1.00    Years: 40.00    Quit date: 06/13/1998  . Smokeless tobacco: Never Used  . Alcohol use No    Review of Systems Constitutional: No fever/chills Eyes: No visual changes. ENT: No sore throat. Cardiovascular: Denies chest pain. Respiratory: Denies shortness of breath. Gastrointestinal: No abdominal pain. nausea, no vomiting.  No diarrhea.  No constipation. Genitourinary: Negative for dysuria. Musculoskeletal: Negative for back pain. Skin: Negative for  rash. Neurological: Negative for headaches, focal weakness or numbness.  10-point ROS otherwise negative.  ____________________________________________   PHYSICAL EXAM:  VITAL SIGNS: ED Triage Vitals  Enc Vitals Group     BP 01/21/16 2054 (!) 168/70     Pulse Rate 01/21/16 2054 75     Resp 01/21/16 2054 18     Temp 01/21/16 2054 99.3 F (37.4 C)     Temp src --      SpO2 01/21/16 2054 97 %     Weight 01/21/16 2055 161 lb (73 kg)     Height 01/21/16 2055 5\' 8"  (1.727 m)     Head Circumference --      Peak Flow --      Pain Score 01/21/16 2056 4     Pain Loc --      Pain Edu? --      Excl. in [place]? --     Constitutional: Alert and oriented. Well appearing and in no acute distress. Eyes: Conjunctivae are normal. PERRL. EOMI. Head: Atraumatic.No sinus  tenderness. Nose: No congestion/rhinnorhea. Mouth/Throat: Mucous membranes are moist.  Oropharynx non-erythematous. Neck: No stridor.   Cardiovascular: Normal rate, regular rhythm. Grossly normal heart sounds.  Good peripheral circulation. Respiratory: Normal respiratory effort.  No retractions. Lungs CTAB. Gastrointestinal: Soft and nontender. No distention. No abdominal bruits. No CVA tenderness. Musculoskeletal: No lower extremity tenderness trace edema .  No joint effusions. Neurologic:  Normal speech and language. No gross focal neurologic deficits are appreciated. No gait instability. Skin:  Skin is warm, dry and intact. No rash noted. Psychiatric: Mood and affect are normal. Speech and behavior are normal.  ____________________________________________   LABS (all labs ordered are listed, but only abnormal results are displayed)  Labs Reviewed  BASIC METABOLIC PANEL - Abnormal; Notable for the following:       Result Value   Chloride 100 (*)    Glucose, Bld 103 (*)    BUN 22 (*)    All other components within normal limits  CBC - Abnormal; Notable for the following:    WBC 12.0 (*)    All other components within  normal limits  TROPONIN I - Abnormal; Notable for the following:    Troponin I 0.03 (*)    All other components within normal limits  URINALYSIS COMPLETEWITH MICROSCOPIC ([facility] ONLY) - Abnormal; Notable for the following:    Color, Urine RED (*)    APPearance CLOUDY (*)    Glucose, UA   (*)    Value: TEST NOT REPORTED DUE TO COLOR INTERFERENCE OF URINE PIGMENT   Bilirubin Urine   (*)    Value: TEST NOT REPORTED DUE TO COLOR INTERFERENCE OF URINE PIGMENT   Ketones, ur   (*)    Value: TEST NOT REPORTED DUE TO COLOR INTERFERENCE OF URINE PIGMENT   Hgb urine dipstick   (*)    Value: TEST NOT REPORTED DUE TO COLOR INTERFERENCE OF URINE PIGMENT   Protein, ur   (*)    Value: TEST NOT REPORTED DUE TO COLOR INTERFERENCE OF URINE PIGMENT   Nitrite   (*)    Value: TEST NOT REPORTED DUE TO COLOR INTERFERENCE OF URINE PIGMENT   Leukocytes, UA   (*)    Value: TEST NOT REPORTED DUE TO COLOR INTERFERENCE OF URINE PIGMENT   Squamous Epithelial / LPF 6-30 (*)    All other components within normal limits  DIFFERENTIAL - Abnormal; Notable for the following:    Neutro Abs 8.8 (*)    All other components within normal limits  COMPREHENSIVE METABOLIC PANEL - Abnormal; Notable for the following:    Sodium 133 (*)    Chloride 99 (*)    BUN 21 (*)    AST 51 (*)    All other components within normal limits  CULTURE, BLOOD (ROUTINE X 2)  CULTURE, BLOOD (ROUTINE X 2)  URINE CULTURE   ____________________________________________  EKG  KG read and interpreted by me shows normal sinus rhythm rate of 74 normal axis no acute ST-T wave changes ____________________________________________  RADIOLOGY  CHEST  2 VIEW  COMPARISON:  08/02/2015  FINDINGS: The heart size and mediastinal contours are within normal limits. Both lungs are clear. The visualized skeletal structures are unremarkable.  IMPRESSION: No active cardiopulmonary disease.   Electronically Signed   By: Andreas Newport M.D.    On: 01/21/2016 21:26 ____________________________________________   PROCEDURES  Procedure(s) performed: Procedures  Critical Care performed:  ____________________________________________   INITIAL IMPRESSION / ASSESSMENT AND PLAN / ED COURSE  Pertinent labs & imaging results  that were available during my care of the patient were reviewed by me and considered in my medical decision making (see chart for details). Patient's troponin is minimally elevated. Patient is has a clear mental status now but was talking out of her head earlier sounds like it could be delirium. And she has to move to count red and white cells in her urine we will admit her and give her some antibiotics and   Clinical Course     ____________________________________________   FINAL CLINICAL IMPRESSION(S) / ED DIAGNOSES  Final diagnoses:  Altered mental status, unspecified altered mental status type  Urinary tract infection with hematuria, site unspecified  Elevated troponin      NEW MEDICATIONS STARTED DURING THIS VISIT:  New Prescriptions   No medications on file     Note:  This document was prepared using Dragon voice recognition software and may include unintentional dictation errors.    Nena Polio, MD 01/21/16 [phone]

## 2016-01-21 NOTE — ED Triage Notes (Signed)
Pt presents to ED via wheelchair, states had bladder scraped yesterday and has been feeling "sick all day today." Granddaughter states pt has had AMS today, reports had a fever yesterday. Pt alert and oriented x 4, no increased work in breathing noted. Pt denies vomiting or diarrhea. Pt reports has had intermittent chest pain since heart surgery since June 13.

## 2016-01-22 DIAGNOSIS — R748 Abnormal levels of other serum enzymes: Secondary | ICD-10-CM | POA: Diagnosis not present

## 2016-01-22 DIAGNOSIS — R4182 Altered mental status, unspecified: Secondary | ICD-10-CM | POA: Diagnosis not present

## 2016-01-22 DIAGNOSIS — N390 Urinary tract infection, site not specified: Secondary | ICD-10-CM | POA: Diagnosis not present

## 2016-01-22 DIAGNOSIS — I10 Essential (primary) hypertension: Secondary | ICD-10-CM | POA: Diagnosis not present

## 2016-01-22 LAB — MAGNESIUM: MAGNESIUM: 1.9 mg/dL (ref 1.7–2.4)

## 2016-01-22 LAB — BASIC METABOLIC PANEL
ANION GAP: 8 (ref 5–15)
BUN: 19 mg/dL (ref 6–20)
CALCIUM: 9.4 mg/dL (ref 8.9–10.3)
CHLORIDE: 100 mmol/L — AB (ref 101–111)
CO2: 29 mmol/L (ref 22–32)
CREATININE: 0.61 mg/dL (ref 0.44–1.00)
GFR calc non Af Amer: >60 mL/min (ref >60)
GLUCOSE: 94 mg/dL (ref 65–99)
Potassium: 3.8 mmol/L (ref 3.5–5.1)
Sodium: 137 mmol/L (ref 135–145)

## 2016-01-22 LAB — TROPONIN I: Troponin I: <0.03 ng/mL (ref <0.03)

## 2016-01-22 LAB — CBC
HCT: 40.5 % (ref 35.0–47.0)
HEMOGLOBIN: 13.6 g/dL (ref 12.0–16.0)
MCH: 31.2 pg (ref 26.0–34.0)
MCHC: 33.7 g/dL (ref 32.0–36.0)
MCV: 92.5 fL (ref 80.0–100.0)
Platelets: 204 10*3/uL (ref 150–440)
RBC: 4.38 MIL/uL (ref 3.80–5.20)
RDW: 12.9 % (ref 11.5–14.5)
WBC: 9 10*3/uL (ref 3.6–11.0)

## 2016-01-22 LAB — PHOSPHORUS: Phosphorus: 3.3 mg/dL (ref 2.5–4.6)

## 2016-01-22 MED ORDER — HYDROCODONE-ACETAMINOPHEN 5-325 MG PO TABS: 1.0000 | ORAL_TABLET | Freq: Four times a day (QID) | ORAL | Status: DC | PRN

## 2016-01-22 MED ORDER — ACETAMINOPHEN 325 MG PO TABS: 650.0000 mg | ORAL_TABLET | Freq: Four times a day (QID) | ORAL | Status: DC | PRN

## 2016-01-22 MED ORDER — ZOLPIDEM TARTRATE 5 MG PO TABS: 5.0000 mg | ORAL_TABLET | Freq: Every evening | ORAL | Status: DC | PRN

## 2016-01-22 MED ORDER — ACETAMINOPHEN 650 MG RE SUPP: 650.0000 mg | Freq: Four times a day (QID) | RECTAL | Status: DC | PRN

## 2016-01-22 MED ORDER — ALBUTEROL SULFATE (2.5 MG/3ML) 0.083% IN NEBU: 3.0000 mL | INHALATION_SOLUTION | Freq: Four times a day (QID) | RESPIRATORY_TRACT | Status: DC | PRN

## 2016-01-22 MED ORDER — METOPROLOL TARTRATE 50 MG PO TABS
100.0000 mg | ORAL_TABLET | Freq: Two times a day (BID) | ORAL | Status: DC
  Administered 2016-01-22 – 2016-01-23 (×4): 100 mg via ORAL
  Filled 2016-01-22 (×4): qty 2

## 2016-01-22 MED ORDER — SENNOSIDES-DOCUSATE SODIUM 8.6-50 MG PO TABS: 1.0000 | ORAL_TABLET | Freq: Every evening | ORAL | Status: DC | PRN

## 2016-01-22 MED ORDER — HYDROCHLOROTHIAZIDE 12.5 MG PO CAPS
12.5000 mg | ORAL_CAPSULE | Freq: Every day | ORAL | Status: DC
  Administered 2016-01-22 – 2016-01-23 (×2): 12.5 mg via ORAL
  Filled 2016-01-22 (×2): qty 1

## 2016-01-22 MED ORDER — OXYBUTYNIN CHLORIDE 5 MG PO TABS
5.0000 mg | ORAL_TABLET | Freq: Three times a day (TID) | ORAL | Status: DC | PRN
  Filled 2016-01-22: qty 1

## 2016-01-22 MED ORDER — DOCUSATE SODIUM 100 MG PO CAPS
100.0000 mg | ORAL_CAPSULE | Freq: Two times a day (BID) | ORAL | Status: DC
  Administered 2016-01-22 – 2016-01-23 (×3): 100 mg via ORAL
  Filled 2016-01-22 (×3): qty 1

## 2016-01-22 MED ORDER — ENOXAPARIN SODIUM 40 MG/0.4ML ~~LOC~~ SOLN
40.0000 mg | Freq: Every day | SUBCUTANEOUS | Status: DC
  Administered 2016-01-22: 40 mg via SUBCUTANEOUS
  Filled 2016-01-22: qty 0.4

## 2016-01-22 MED ORDER — AMLODIPINE BESYLATE 5 MG PO TABS
5.0000 mg | ORAL_TABLET | Freq: Every day | ORAL | Status: DC
  Administered 2016-01-22: 5 mg via ORAL
  Filled 2016-01-22: qty 1

## 2016-01-22 MED ORDER — ONDANSETRON HCL 4 MG PO TABS: 4.0000 mg | ORAL_TABLET | Freq: Four times a day (QID) | ORAL | Status: DC | PRN

## 2016-01-22 MED ORDER — SODIUM CHLORIDE 0.9% FLUSH
3.0000 mL | Freq: Two times a day (BID) | INTRAVENOUS | Status: DC
  Administered 2016-01-22 (×3): 3 mL via INTRAVENOUS

## 2016-01-22 MED ORDER — BISACODYL 5 MG PO TBEC: 5.0000 mg | DELAYED_RELEASE_TABLET | Freq: Every day | ORAL | Status: DC | PRN

## 2016-01-22 MED ORDER — CLOPIDOGREL BISULFATE 75 MG PO TABS
75.0000 mg | ORAL_TABLET | Freq: Every day | ORAL | Status: DC
Start: 2016-01-22 — End: 2016-01-23
  Administered 2016-01-22 – 2016-01-23 (×2): 75 mg via ORAL
  Filled 2016-01-22 (×2): qty 1

## 2016-01-22 MED ORDER — SODIUM CHLORIDE 0.9 % IV SOLN
INTRAVENOUS | Status: DC
  Administered 2016-01-22 – 2016-01-23 (×3): via INTRAVENOUS

## 2016-01-22 MED ORDER — ASPIRIN 81 MG PO CHEW
81.0000 mg | CHEWABLE_TABLET | Freq: Every day | ORAL | Status: DC
Start: 2016-01-22 — End: 2016-01-23
  Administered 2016-01-22 – 2016-01-23 (×2): 81 mg via ORAL
  Filled 2016-01-22 (×2): qty 1

## 2016-01-22 MED ORDER — DEXTROSE 5 % IV SOLN
1.0000 g | INTRAVENOUS | Status: DC
  Filled 2016-01-22: qty 10

## 2016-01-22 MED ORDER — ACETAMINOPHEN 325 MG PO TABS
650.0000 mg | ORAL_TABLET | Freq: Four times a day (QID) | ORAL | Status: DC | PRN
  Administered 2016-01-23: 650 mg via ORAL
  Filled 2016-01-22: qty 2

## 2016-01-22 MED ORDER — ONDANSETRON HCL 4 MG/2ML IJ SOLN
4.0000 mg | Freq: Four times a day (QID) | INTRAMUSCULAR | Status: DC | PRN
Start: 2016-01-22 — End: 2016-01-23

## 2016-01-22 MED ORDER — MAGNESIUM CITRATE PO SOLN: 1.0000 | Freq: Once | ORAL | Status: DC | PRN

## 2016-01-22 NOTE — Progress Notes (Signed)
Pharmacy Antibiotic Note  Melissa Cochran is a 80 y.o. female admitted on 01/21/2016 with UTI.  Pharmacy has been consulted for ceftriaxone dosing.  Plan: Ceftriaxone 1 gm IV Q24H  Height: 5\' 8"  (172.7 cm) Weight: 161 lb (73 kg) IBW/kg (Calculated) : 63.9  Temp (24hrs), Avg:98.6 F (37 C), Min:97.9 F (36.6 C), Max:99.3 F (37.4 C)   Recent Labs Lab 01/21/16 2057  WBC 12.0*  CREATININE 0.82  0.80    Estimated Creatinine Clearance: 58.5 mL/min (by C-G formula based on SCr of 0.8 mg/dL).    No Known Allergies   Thank you for allowing pharmacy to be a part of this patient's care.  Laural Benes, Pharm.D., BCPS Clinical Pharmacist 01/22/2016 2:05 AM

## 2016-01-22 NOTE — H&P (Signed)
[place] @ [facility] Admission History and Physical Harvie Bridge, D.O.  ---------------------------------------------------------------------------------------------------------------------   PATIENT NAME: Melissa Cochran MR#: [id number] DATE OF BIRTH: May 18, 1935 DATE OF ADMISSION: 01/21/2016 PRIMARY CARE PHYSICIAN: Otilio Miu, MD  REQUESTING/REFERRING PHYSICIAN: ED Dr. Cinda Quest  CHIEF COMPLAINT: Chief Complaint  Patient presents with  . Post-op Problem    HISTORY OF PRESENT ILLNESS:Please note the majority of the history is obtained from the emergency department physician and records as patient is an unreliable historian secondary to altered mental status  Melissa Cochran is a 80 y.o. female with a known history of CHF, COPD, hypertension, hyperlipidemia, postop day #1 status post bladder cystoscopy for evaluation of bladder tumor was in a usual state of health until today when her family states that she was confused, not making sense. Patient only reported that she felt unwell to the emergency department physician with no discrete complaints. Family reportedly denied any symptoms other than fever yesterday and some slight redness of the right side of her face.  Otherwise there has been no change in status. Patient has been taking medication as prescribed and there has been no recent change in medication or diet.  There has been no recent illness, travel or sick contacts.    Patient reportedly denies fevers/chills, weakness, dizziness, chest pain, shortness of breath, N/V/C/D, abdominal pain, dysuria/frequency.   EMS/ED COURSE:  Patient received Rocephin  PAST MEDICAL HISTORY: Past Medical History:  Diagnosis Date  . Adnexal mass   . Anxiety   . Arthritis   . Bladder tumor   . Bleeding ulcer   . CHF (congestive heart failure) ([place])   . CHF (congestive heart failure) ([place])   . COPD (chronic obstructive pulmonary disease) ([place])   . Coronary artery  disease   . GERD (gastroesophageal reflux disease)   . Heart block    left  . History of bleeding ulcers   . Hyperlipemia   . Hypertension   . Pneumonia   . Shortness of breath dyspnea       PAST SURGICAL HISTORY: Past Surgical History:  Procedure Laterality Date  . ABDOMINAL HYSTERECTOMY    . CARDIAC CATHETERIZATION Left 10/21/2015   Procedure: Left Heart Cath and Coronary Angiography;  Surgeon: Isaias Cowman, MD;  Location: [place] [facility];  Service: Cardiovascular;  Laterality: Left;  . CARDIAC CATHETERIZATION N/A 10/21/2015   Procedure: Coronary Stent Intervention;  Surgeon: Isaias Cowman, MD;  Location: [place] [facility];  Service: Cardiovascular;  Laterality: N/A;  . CARDIAC SURGERY     cardiac cath  . CYSTOSCOPY W/ RETROGRADES Bilateral 01/20/2016   Procedure: CYSTOSCOPY WITH RETROGRADE PYELOGRAM;  Surgeon: Hollice Espy, MD;  Location: [facility];  Service: Urology;  Laterality: Bilateral;  . CYSTOSCOPY WITH STENT PLACEMENT Right 01/20/2016   Procedure: CYSTOSCOPY WITH STENT PLACEMENT;  Surgeon: Hollice Espy, MD;  Location: [facility];  Service: Urology;  Laterality: Right;  . HIP SURGERY     left  . STOMACH SURGERY    . TRANSURETHRAL RESECTION OF BLADDER TUMOR N/A 01/20/2016   Procedure: TRANSURETHRAL RESECTION OF BLADDER TUMOR (TURBT);  Surgeon: Hollice Espy, MD;  Location: [facility];  Service: Urology;  Laterality: N/A;      SOCIAL HISTORY: Social History  Substance Use Topics  . Smoking status: Former Smoker    Packs/day: 1.00    Years: 40.00    Quit date: 06/13/1998  . Smokeless tobacco: Never Used  . Alcohol use No      FAMILY HISTORY: Family History  Problem Relation Age of Onset  . Stomach cancer Mother   . CVA Father   . Bladder Cancer Neg Hx   . Kidney cancer Neg Hx      MEDICATIONS AT HOME: Prior to Admission medications   Medication Sig Start Date End Date Taking? Authorizing Provider  acetaminophen (TYLENOL) 325 MG tablet  Take 650 mg by mouth every 6 (six) hours as needed for mild pain or headache.    Yes Historical Provider, MD  albuterol (PROVENTIL HFA;VENTOLIN HFA) 108 (90 BASE) MCG/ACT inhaler Inhale 2 puffs into the lungs every 6 (six) hours as needed for wheezing or shortness of breath. 01/28/15  Yes Aldean Jewett, MD  amLODipine (NORVASC) 5 MG tablet Take 5 mg by mouth daily.   Yes Historical Provider, MD  aspirin 81 MG chewable tablet Chew 1 tablet (81 mg total) by mouth daily. 10/22/15  Yes Isaias Cowman, MD  clopidogrel (PLAVIX) 75 MG tablet Take 1 tablet (75 mg total) by mouth daily. 02/24/15  Yes [name], MD  docusate sodium (COLACE) 100 MG capsule Take 1 capsule (100 mg total) by mouth 2 (two) times daily. 01/20/16  Yes Hollice Espy, MD  hydrochlorothiazide (MICROZIDE) 12.5 MG capsule Take 12.5 mg by mouth daily.   Yes Historical Provider, MD  HYDROcodone-acetaminophen (NORCO/VICODIN) 5-325 MG tablet Take 1-2 tablets by mouth every 6 (six) hours as needed for moderate pain. 01/20/16  Yes Hollice Espy, MD  metoprolol (LOPRESSOR) 100 MG tablet Take 1 tablet (100 mg total) by mouth 2 (two) times daily. 10/31/15  Yes [name], MD  oxybutynin (DITROPAN) 5 MG tablet Take 1 tablet (5 mg total) by mouth every 8 (eight) hours as needed for bladder spasms. 01/20/16  Yes Hollice Espy, MD      DRUG ALLERGIES: No Known Allergies   REVIEW OF SYSTEMS: CONSTITUTIONAL: Positive fever, negative chills,, fatigue, weakness, weight gain/loss, headache EYES: No blurry or double vision. ENT: No tinnitus, postnasal drip, redness or soreness of the oropharynx. RESPIRATORY: No cough, wheeze, hemoptysis, dyspnea. CARDIOVASCULAR: No chest pain, orthopnea, palpitations, syncope. GASTROINTESTINAL: No nausea, vomiting, constipation, diarrhea, abdominal pain, hematemesis, melena or hematochezia. GENITOURINARY: No dysuria or hematuria. ENDOCRINE: No polyuria or nocturia. No heat or cold  intolerance. HEMATOLOGY: No anemia, bruising, bleeding. INTEGUMENTARY: No rashes, ulcers, lesions. MUSCULOSKELETAL: No arthritis, swelling, gout. NEUROLOGIC: No numbness, tingling, weakness or ataxia. No seizure-type activity.Positive altered mental status. PSYCHIATRIC: No anxiety, depression, insomnia.  PHYSICAL EXAMINATION: VITAL SIGNS: Blood pressure (!) 176/81, pulse 76, temperature 99.3 F (37.4 C), resp. rate 16, height 5\' 8"  (1.727 m), weight 73 kg (161 lb), SpO2 96 %.  GENERAL: 80 y.o.-year-old white female patient, well-developed, well-nourished lying in the bed in no acute distress.  Pleasant and cooperative.   HEENT: Head atraumatic, normocephalic. Pupils equal, round, reactive to light and accommodation. No scleral icterus. Extraocular muscles intact. Nares are patent. Oropharynx is clear. Mucus membranes moist. NECK: Supple, full range of motion. No JVD, no bruit heard. No thyroid enlargement, no tenderness, no cervical lymphadenopathy. CHEST: Normal breath sounds bilaterally. No wheezing, rales, rhonchi or crackles. No use of accessory muscles of respiration.  No reproducible chest wall tenderness.  CARDIOVASCULAR: S1, S2 normal. No murmurs, rubs, or gallops. Cap refill <2 seconds. ABDOMEN: Soft, nontender, nondistended. No rebound, guarding, rigidity. Normoactive bowel sounds present in all four quadrants. No organomegaly or mass. EXTREMITIES: Full range of motion. No pedal edema, cyanosis, or clubbing. NEUROLOGIC: Cranial nerves II through XII are grossly intact with no focal sensorimotor  deficit. Muscle strength 5/5 in all extremities. Sensation intact. Gait not checked. PSYCHIATRIC: The patient is alert and oriented x 3. Normal affect, mood, thought content. SKIN: Warm, dry, and intact without obvious rash, lesion, or ulcer.  LABORATORY PANEL:  CBC  Recent Labs Lab 01/21/16 2057  WBC 12.0*  HGB 13.8  HCT 42.8  PLT 225    ----------------------------------------------------------------------------------------------------------------- Chemistries  Recent Labs Lab 01/21/16 2057  NA 133*  135  K 4.8  4.8  CL 99*  100*  CO2 25  28  GLUCOSE 97  103*  BUN 21*  22*  CREATININE 0.82  0.80  CALCIUM 9.4  9.5  AST 51*  ALT 18  ALKPHOS 79  BILITOT 1.2   ------------------------------------------------------------------------------------------------------------------ Cardiac Enzymes  Recent Labs Lab 01/21/16 2057  TROPONINI 0.03*   ------------------------------------------------------------------------------------------------------------------  RADIOLOGY: Dg Chest 2 View  Result Date: 01/21/2016 CLINICAL DATA:  Fever and altered mental status. EXAM: CHEST  2 VIEW COMPARISON:  08/02/2015 FINDINGS: The heart size and mediastinal contours are within normal limits. Both lungs are clear. The visualized skeletal structures are unremarkable. IMPRESSION: No active cardiopulmonary disease. Electronically Signed   By: Andreas Newport M.D.   On: 01/21/2016 21:26    EKG: Sinus at 74 bpm with normal axis and nonspecific ST and T wave changes  IMPRESSION AND PLAN:  This is a 80 y.o. female with a history of COPD, CHF, hypertension, hyperlipidemia, recently diagnosed bladder cancer, pulmonary nodules possibly metastatic disease now being admitted with: 1. Altered mental status, possibly secondary to urinary tract infection given recent procedure. We will admit for IV fluid hydration, IV antibiotics, clean-catch urinalysis and culture, blood cultures. Given recent urologic procedure may consider consulting urology. 2. Pearline Cables zone elevation of troponin-we'll monitor on telemetry and trend troponins. 3. History of COPD-continue albuterol 4. History of hypertension continue Norvasc, Microzide, metoprolol 5. History of coronary artery disease-continue aspirin, Plavix. Please note patient had resumed her  regular dosing of aspirin and Plavix on the morning of 01/21/2016. 6. History of incontinence-continue Ditropan  Diet/Nutrition: Heart healthy Fluids: IV normal saline DVT Px: Lovenox, SCDs and early ambulation Code Status: Full  All the records are reviewed and case discussed with ED provider. Management plans discussed with the patient and/or family who express understanding and agree with plan of care.   TOTAL TIME TAKING CARE OF THIS PATIENT: 60 minutes.   Melissa Cochran D.O. on 01/22/2016 at 1:50 AM Between 7am to 6pm - Pager - [phone] After 6pm go to [email or web address] - [employer] [facility] Office [phone] CC: Primary care physician; Otilio Miu, MD     Note: This dictation was prepared with Dragon dictation along with smaller phrase technology. Any transcriptional errors that result from this process are unintentional. \

## 2016-01-22 NOTE — Progress Notes (Signed)
[place] [facility] [place] NAME: Melissa Cochran    MR#:  [id number]  DATE OF BIRTH:  1935/11/20  SUBJECTIVE:  CHIEF COMPLAINT:   Chief Complaint  Patient presents with  . Post-op Problem   - admitted with confusion, had TURBT 2 days ago - more alert today  REVIEW OF SYSTEMS:  Review of Systems  Constitutional: Negative for chills, fever and malaise/fatigue.  HENT: Negative for ear discharge, ear pain and nosebleeds.   Eyes: Negative for blurred vision.  Respiratory: Negative for cough, shortness of breath and wheezing.   Cardiovascular: Negative for chest pain, palpitations and leg swelling.  Gastrointestinal: Negative for abdominal pain, constipation, diarrhea, nausea and vomiting.  Genitourinary: Negative for dysuria and urgency.  Neurological: Negative for dizziness, sensory change, speech change, focal weakness, seizures and headaches.  Psychiatric/Behavioral: Negative for depression.    DRUG ALLERGIES:  No Known Allergies  VITALS:  Blood pressure (!) 170/78, pulse 66, temperature 98 F (36.7 C), temperature source Oral, resp. rate 18, height 5\' 8"  (1.727 m), weight 69.9 kg (154 lb 3.2 oz), SpO2 95 %.  PHYSICAL EXAMINATION:  Physical Exam  GENERAL:  80 y.o.-year-old patient lying in the bed with no acute distress.  EYES: Pupils equal, round, reactive to light and accommodation. No scleral icterus. Extraocular muscles intact.  HEENT: Head atraumatic, normocephalic. Oropharynx and nasopharynx clear.  NECK:  Supple, no jugular venous distention. No thyroid enlargement, no tenderness.  LUNGS: Normal breath sounds bilaterally, no wheezing, rales,rhonchi or crepitation. No use of accessory muscles of respiration.  CARDIOVASCULAR: S1, S2 normal. No murmurs, rubs, or gallops.  ABDOMEN: Soft, nontender, nondistended. Bowel sounds present. No organomegaly or mass.  EXTREMITIES: No pedal edema, cyanosis, or clubbing.  NEUROLOGIC:  Cranial nerves II through XII are intact. Muscle strength 5/5 in all extremities. Sensation intact. Gait not checked.  PSYCHIATRIC: The patient is alert and oriented x 3.  SKIN: No obvious rash, lesion, or ulcer.    LABORATORY PANEL:   CBC  Recent Labs Lab 01/22/16 0256  WBC 9.0  HGB 13.6  HCT 40.5  PLT 204   ------------------------------------------------------------------------------------------------------------------  Chemistries   Recent Labs Lab 01/21/16 2057 01/22/16 0256  NA 133*  135 137  K 4.8  4.8 3.8  CL 99*  100* 100*  CO2 25  28 29   GLUCOSE 97  103* 94  BUN 21*  22* 19  CREATININE 0.82  0.80 0.61  CALCIUM 9.4  9.5 9.4  MG 1.9  --   AST 51*  --   ALT 18  --   ALKPHOS 79  --   BILITOT 1.2  --    ------------------------------------------------------------------------------------------------------------------  Cardiac Enzymes  Recent Labs Lab 01/22/16 0744  TROPONINI <0.03   ------------------------------------------------------------------------------------------------------------------  RADIOLOGY:  Dg Chest 2 View  Result Date: 01/21/2016 CLINICAL DATA:  Fever and altered mental status. EXAM: CHEST  2 VIEW COMPARISON:  08/02/2015 FINDINGS: The heart size and mediastinal contours are within normal limits. Both lungs are clear. The visualized skeletal structures are unremarkable. IMPRESSION: No active cardiopulmonary disease. Electronically Signed   By: Andreas Newport M.D.   On: 01/21/2016 21:26    EKG:   Orders placed or performed during the hospital encounter of 01/21/16  . ED EKG within 10 minutes  . ED EKG within 10 minutes  . EKG 12-Lead  . EKG 12-Lead    ASSESSMENT AND PLAN:   80 year old female with past medical history significant for COPD, hypertension, hyperlipidemia and  CHF presents to hospital secondary to confusion.  #1 confusion-metabolic encephalopathy. Maybe pain medications and anticholinergics after her  procedure - discontinue ditropan and norco - Much improved now - no evidence of infection - discontinue rocephin  #2 bladder tumor-status post transurethral bladder tumor resection 2 days ago. -Neurologist notified. Outpatient follow-up recommended.  #3 Hypertension-on Norvasc, HCTZ and metoprolol  #4 DVT Prophylaxis- lovenox   Physical Therapy consult   All the records are reviewed and case discussed with Care Management/Social Workerr. Management plans discussed with the patient, family and they are in agreement.  CODE STATUS: Full Code  TOTAL TIME TAKING CARE OF THIS PATIENT: 37 minutes.   POSSIBLE D/C TOMORROW, DEPENDING ON CLINICAL CONDITION.   Gladstone Lighter M.D on 01/22/2016 at 1:07 PM  Between 7am to 6pm - Pager - [phone]  After 6pm go to [email or web address] - password [email or web address]  Office  [phone]  CC: Primary care physician; Otilio Miu, MD

## 2016-01-22 NOTE — Progress Notes (Signed)
Initial Nutrition Assessment  DOCUMENTATION CODES:   Not applicable  INTERVENTION:  -Cater to pt preferences -If po intake inadequate on follow-up, recommend addition of nutritional supplement  NUTRITION DIAGNOSIS:   Inadequate oral intake related to acute illness as evidenced by per patient/family report.  GOAL:   Patient will meet greater than or equal to 90% of their needs  MONITOR:   PO intake, Weight trends, Labs  REASON FOR ASSESSMENT:   Malnutrition Screening Tool    ASSESSMENT:   80 yo female admitted with confusion/metabolic encephalopathy s/p transurethral bladder tumor resection 2 days ago  Pt reports poor appetite since surgery 2 days ago; prior to this appetite good but pt has been on a "strict diet" since heart surgery this past summer. Pt reports some wt loss which she mostly attributes to her heart healthy strict diet.   Nutrition-Focused physical exam completed. Findings are WDL for fat depletion, muscle depletion, and edema.   Past Medical History:  Diagnosis Date  . Adnexal mass   . Anxiety   . Arthritis   . Bladder tumor   . Bleeding ulcer   . CHF (congestive heart failure) ([place])   . CHF (congestive heart failure) ([place])   . COPD (chronic obstructive pulmonary disease) ([place])   . Coronary artery disease   . GERD (gastroesophageal reflux disease)   . Heart block    left  . History of bleeding ulcers   . Hyperlipemia   . Hypertension   . Pneumonia   . Shortness of breath dyspnea      Diet Order:  Diet Heart Room service appropriate? Yes; Fluid consistency: Thin  Skin:  Reviewed, no issues  Last BM:  9/13   Labs: reviewed  Meds: reviewed  Height:   Ht Readings from Last 1 Encounters:  01/22/16 5\' 8"  (1.727 m)    Weight:   Wt Readings from Last 1 Encounters:  01/22/16 154 lb 3.2 oz (69.9 kg)   Filed Weights   01/21/16 2055 01/22/16 0202  Weight: 161 lb (73 kg) 154 lb 3.2 oz (69.9 kg)    BMI:  Body mass index is 23.45  kg/m.  Estimated Nutritional Needs:   Kcal:  1700-1900 kcals  Protein:  85-95 g  Fluid:  >/= 1.7 L  EDUCATION NEEDS:   No education needs identified at this time  [place], [place], [place] [phone] Pager  [phone] Weekend/On-Call Pager

## 2016-01-23 DIAGNOSIS — R748 Abnormal levels of other serum enzymes: Secondary | ICD-10-CM | POA: Diagnosis not present

## 2016-01-23 DIAGNOSIS — R4182 Altered mental status, unspecified: Secondary | ICD-10-CM | POA: Diagnosis not present

## 2016-01-23 DIAGNOSIS — N390 Urinary tract infection, site not specified: Secondary | ICD-10-CM | POA: Diagnosis not present

## 2016-01-23 DIAGNOSIS — I10 Essential (primary) hypertension: Secondary | ICD-10-CM | POA: Diagnosis not present

## 2016-01-23 LAB — BASIC METABOLIC PANEL
Anion gap: 5 (ref 5–15)
BUN: 13 mg/dL (ref 6–20)
CHLORIDE: 106 mmol/L (ref 101–111)
CO2: 28 mmol/L (ref 22–32)
Calcium: 9 mg/dL (ref 8.9–10.3)
Creatinine, Ser: 0.44 mg/dL (ref 0.44–1.00)
GFR calc Af Amer: >60 mL/min (ref >60)
GFR calc non Af Amer: >60 mL/min (ref >60)
GLUCOSE: 95 mg/dL (ref 65–99)
POTASSIUM: 3.7 mmol/L (ref 3.5–5.1)
SODIUM: 139 mmol/L (ref 135–145)

## 2016-01-23 LAB — CBC
HCT: 39.5 % (ref 35.0–47.0)
HEMOGLOBIN: 13.3 g/dL (ref 12.0–16.0)
MCH: 31.1 pg (ref 26.0–34.0)
MCHC: 33.7 g/dL (ref 32.0–36.0)
MCV: 92.4 fL (ref 80.0–100.0)
Platelets: 194 10*3/uL (ref 150–440)
RBC: 4.28 MIL/uL (ref 3.80–5.20)
RDW: 13 % (ref 11.5–14.5)
WBC: 7.1 10*3/uL (ref 3.6–11.0)

## 2016-01-23 MED ORDER — AMOXICILLIN-POT CLAVULANATE 500-125 MG PO TABS
1.0000 | ORAL_TABLET | Freq: Two times a day (BID) | ORAL | Status: DC
  Filled 2016-01-23 (×2): qty 1

## 2016-01-23 MED ORDER — HYDRALAZINE HCL 20 MG/ML IJ SOLN
10.0000 mg | INTRAMUSCULAR | Status: DC | PRN
  Administered 2016-01-23 (×2): 10 mg via INTRAVENOUS
  Filled 2016-01-23 (×2): qty 1

## 2016-01-23 MED ORDER — AMLODIPINE BESYLATE 10 MG PO TABS: 10.0000 mg | ORAL_TABLET | Freq: Every day | ORAL | 0 refills | Status: DC

## 2016-01-23 MED ORDER — AMLODIPINE BESYLATE 10 MG PO TABS
10.0000 mg | ORAL_TABLET | Freq: Every day | ORAL | Status: DC
  Administered 2016-01-23: 10 mg via ORAL
  Filled 2016-01-23: qty 1

## 2016-01-23 MED ORDER — AMOXICILLIN-POT CLAVULANATE 500-125 MG PO TABS: 1.0000 | ORAL_TABLET | Freq: Two times a day (BID) | ORAL | 0 refills | Status: DC

## 2016-01-23 NOTE — Discharge Summary (Signed)
[facility] NAME: Melissa Cochran    MR#:  [id number]  DATE OF BIRTH:  [birth year]  DATE OF ADMISSION:  01/21/2016 ADMITTING PHYSICIAN: Harvie Bridge, DO  DATE OF DISCHARGE: 01/23/2016  PRIMARY CARE PHYSICIAN: Otilio Miu, MD    ADMISSION DIAGNOSIS:  Elevated troponin [R79.89] Urinary tract infection with hematuria, site unspecified [N39.0, R31.9] Altered mental status, unspecified altered mental status type [R41.82]  DISCHARGE DIAGNOSIS:  Active Problems:   Altered mental status   SECONDARY DIAGNOSIS:   Past Medical History:  Diagnosis Date  . Adnexal mass   . Anxiety   . Arthritis   . Bladder tumor   . Bleeding ulcer   . CHF (congestive heart failure) ([place])   . CHF (congestive heart failure) ([place])   . COPD (chronic obstructive pulmonary disease) ([place])   . Coronary artery disease   . GERD (gastroesophageal reflux disease)   . Heart block    left  . History of bleeding ulcers   . Hyperlipemia   . Hypertension   . Pneumonia   . Shortness of breath dyspnea     HOSPITAL COURSE:   1. Acute metabolic encephalopathy. Could be secondary to combination of pain medication and anti-cholinergic. Stop Norco and Ditropan. Mental status improved. 2. Bladder tumor status post transurethral bladder tumor resection 2 days ago. Urine culture growing gram-negative rods and possible enterococcus. We'll prescribe Augmentin for few more days. 3. Hypertension on Norvasc hydrochlorothiazide and metoprolol. With blood pressure being high increase the Norvasc. 4. History of congestive heart failureof heart failure on this hospital course.  DISCHARGE CONDITIONS:   Satisfactory  CONSULTS OBTAINED:   urology  DRUG ALLERGIES:  No Known Allergies  DISCHARGE MEDICATIONS:   Current Discharge Medication List    START taking these medications   Details  amoxicillin-clavulanate (AUGMENTIN) 500-125 MG tablet Take 1 tablet (500 mg  total) by mouth 2 (two) times daily. Qty: 10 tablet, Refills: 0      CONTINUE these medications which have CHANGED   Details  amLODipine (NORVASC) 10 MG tablet Take 1 tablet (10 mg total) by mouth daily. Qty: 30 tablet, Refills: 0      CONTINUE these medications which have NOT CHANGED   Details  acetaminophen (TYLENOL) 325 MG tablet Take 650 mg by mouth every 6 (six) hours as needed for mild pain or headache.     albuterol (PROVENTIL HFA;VENTOLIN HFA) 108 (90 BASE) MCG/ACT inhaler Inhale 2 puffs into the lungs every 6 (six) hours as needed for wheezing or shortness of breath. Qty: 1 Inhaler, Refills: 2    aspirin 81 MG chewable tablet Chew 1 tablet (81 mg total) by mouth daily. Qty: 30 tablet, Refills: 5    clopidogrel (PLAVIX) 75 MG tablet Take 1 tablet (75 mg total) by mouth daily. Qty: 30 tablet, Refills: 6    docusate sodium (COLACE) 100 MG capsule Take 1 capsule (100 mg total) by mouth 2 (two) times daily. Qty: 60 capsule, Refills: 0    hydrochlorothiazide (MICROZIDE) 12.5 MG capsule Take 12.5 mg by mouth daily.    metoprolol (LOPRESSOR) 100 MG tablet Take 1 tablet (100 mg total) by mouth 2 (two) times daily. Qty: 60 tablet, Refills: 0      STOP taking these medications     HYDROcodone-acetaminophen (NORCO/VICODIN) 5-325 MG tablet      oxybutynin (DITROPAN) 5 MG tablet          DISCHARGE INSTRUCTIONS:   Follow-up PMD one week Follow-up  with Dr. Erlene Quan 2 weeks  If you experience worsening of your admission symptoms, develop shortness of breath, life threatening emergency, suicidal or homicidal thoughts you must seek medical attention immediately by calling 911 or calling your MD immediately  if symptoms less severe.  You Must read complete instructions/literature along with all the possible adverse reactions/side effects for all the Medicines you take and that have been prescribed to you. Take any new Medicines after you have completely understood and accept all  the possible adverse reactions/side effects.   Please note  You were cared for by a hospitalist during your hospital stay. If you have any questions about your discharge medications or the care you received while you were in the hospital after you are discharged, you can call the unit and asked to speak with the hospitalist on call if the hospitalist that took care of you is not available. Once you are discharged, your primary care physician will handle any further medical issues. Please note that NO REFILLS for any discharge medications will be authorized once you are discharged, as it is imperative that you return to your primary care physician (or establish a relationship with a primary care physician if you do not have one) for your aftercare needs so that they can reassess your need for medications and monitor your lab values.    Today   CHIEF COMPLAINT:   Chief Complaint  Patient presents with  . Post-op Problem    HISTORY OF PRESENT ILLNESS:  Melissa Cochran  is a 80 y.o. female presented to the hospital with altered mental status   VITAL SIGNS:  Blood pressure (!) 182/61, pulse 64, temperature 98 F (36.7 C), temperature source Oral, resp. rate 16, height 5\' 8"  (1.727 m), weight 69.9 kg (154 lb 3.2 oz), SpO2 99 %.    PHYSICAL EXAMINATION:  GENERAL:  80 y.o.-year-old patient lying in the bed with no acute distress.  EYES: Pupils equal, round, reactive to light and accommodation. No scleral icterus. Extraocular muscles intact.  HEENT: Head atraumatic, normocephalic. Oropharynx and nasopharynx clear.  NECK:  Supple, no jugular venous distention. No thyroid enlargement, no tenderness.  LUNGS: Normal breath sounds bilaterally, no wheezing, rales,rhonchi or crepitation. No use of accessory muscles of respiration.  CARDIOVASCULAR: S1, S2 normal. No murmurs, rubs, or gallops.  ABDOMEN: Soft, non-tender, non-distended. Bowel sounds present. No organomegaly or mass.  EXTREMITIES:  No pedal edema, cyanosis, or clubbing.  NEUROLOGIC: Cranial nerves II through XII are intact. Muscle strength 5/5 in all extremities. Sensation intact. Gait not checked.  PSYCHIATRIC: The patient is alert and oriented x 3.  SKIN: No obvious rash, lesion, or ulcer.   DATA REVIEW:   CBC  Recent Labs Lab 01/23/16 0548  WBC 7.1  HGB 13.3  HCT 39.5  PLT 194    Chemistries   Recent Labs Lab 01/21/16 2057  01/23/16 0548  NA 133*  135  < > 139  K 4.8  4.8  < > 3.7  CL 99*  100*  < > 106  CO2 25  28  < > 28  GLUCOSE 97  103*  < > 95  BUN 21*  22*  < > 13  CREATININE 0.82  0.80  < > 0.44  CALCIUM 9.4  9.5  < > 9.0  MG 1.9  --   --   AST 51*  --   --   ALT 18  --   --   ALKPHOS 79  --   --  BILITOT 1.2  --   --   < > = values in this interval not displayed.  Cardiac Enzymes  Recent Labs Lab 01/22/16 1341  TROPONINI <0.03    Microbiology Results  Results for orders placed or performed during the hospital encounter of 01/21/16  Urine culture     Status: Abnormal (Preliminary result)   Collection Time: 01/21/16  9:30 PM  Result Value Ref Range Status   Specimen Description URINE, RANDOM  Final   Special Requests NONE  Final   Culture (A)  Final    60,000 COLONIES/mL ESCHERICHIA COLI 40,000 COLONIES/mL ENTEROCOCCUS FAECALIS    Report Status PENDING  Incomplete  Culture, blood (routine x 2)     Status: None (Preliminary result)   Collection Time: 01/21/16 11:27 PM  Result Value Ref Range Status   Specimen Description BLOOD RIGHT HAND  Final   Special Requests BOTTLES DRAWN AEROBIC AND ANAEROBIC 5CC  Final   Culture NO GROWTH 2 DAYS  Final   Report Status PENDING  Incomplete  Culture, blood (routine x 2)     Status: None (Preliminary result)   Collection Time: 01/21/16 11:28 PM  Result Value Ref Range Status   Specimen Description BLOOD RIGHT HAND  Final   Special Requests BOTTLES DRAWN AEROBIC AND ANAEROBIC 5CC  Final   Culture NO GROWTH 2 DAYS  Final    Report Status PENDING  Incomplete    RADIOLOGY:  Dg Chest 2 View  Result Date: 01/21/2016 CLINICAL DATA:  Fever and altered mental status. EXAM: CHEST  2 VIEW COMPARISON:  08/02/2015 FINDINGS: The heart size and mediastinal contours are within normal limits. Both lungs are clear. The visualized skeletal structures are unremarkable. IMPRESSION: No active cardiopulmonary disease. Electronically Signed   By: Andreas Newport M.D.   On: 01/21/2016 21:26   Management plans discussed with the patient, family and they are in agreement.  CODE STATUS:     Code Status Orders        Start     Ordered   01/22/16 0157  Full code  Continuous     01/22/16 0157    Code Status History    Date Active Date Inactive Code Status Order ID Comments User Context   10/21/2015  9:36 AM 10/22/2015  3:11 PM Full Code [id number]  Isaias Cowman, MD Inpatient   01/24/2015  9:30 PM 01/28/2015  5:07 PM Full Code [id number]  Henreitta Leber, MD Inpatient      TOTAL TIME TAKING CARE OF THIS PATIENT: 35 minutes.    Loletha Grayer M.D on 01/23/2016 at 2:58 PM  Between 7am to 6pm - Pager - [phone]  After 6pm go to [email or web address] - password [email or web address] [facility]  [phone]  CC: Primary care physician; Otilio Miu, MD

## 2016-01-23 NOTE — Care Management Obs Status (Signed)
[facility] NOTIFICATION   Patient Details  Name: SHELESE GEESAMAN MRN: [id number] Date of Birth: [birth year]   Medicare Observation Status Notification Given:  Yes    Jolly Mango, RN 01/23/2016, 1:25 PM

## 2016-01-23 NOTE — Progress Notes (Signed)
Called Dr. Estanislado Pandy regarding patient's high blood pressure.  Appropriate orders were placed.  Christene Slates  01/23/2016 4:50 AM

## 2016-01-23 NOTE — Care Management (Signed)
Patient admitted with AMS secondary to UTI. Patient lives at home with RW, cane, and BSC.  Obtains medications from [facility] in [address].  PT has assessed patient and recommended home health.  Patient has declined home health services.  RNCM signing off

## 2016-01-23 NOTE — Discharge Instructions (Signed)
Altered mental status likely with norco and ditropan.  Stop both of these medications  I increased norvasc to 10 mg po daily

## 2016-01-23 NOTE — Evaluation (Signed)
Physical Therapy Evaluation Patient Details Name: DEJIAH RANIERI MRN: [id number] DOB: 11-06-1935 Today's Date: 01/23/2016   History of Present Illness  Nerissa Starr is a 80 y.o. female with a known history of CHF, COPD, hypertension, hyperlipidemia, postop day #1 status post bladder cystoscopy for evaluation of bladder tumor was in a usual state of health until today when her family states that she was confused, not making sense. Patient only reported that she felt unwell to the emergency department physician with no discrete complaints. Family reportedly denied any symptoms other than fever yesterday and some slight redness of the right side of her face. Denies falls in the last 12 months  Clinical Impression  Pt admitted with above diagnosis. Pt currently with functional limitations due to the deficits listed below (see PT Problem List).  Pt demonstrates independence with bed mobility and CGA only for transfers and ambulation. She is able to complete a full lap around RN station and VSS without DOE. Pt is mildly unsteady but able to self correct. Encouraged use of rolling walker at discharge. Recommend HH PT but pt declines at this time. Pt will benefit from skilled PT services to address deficits in strength, balance, and mobility in order to return to full function at home.     Follow Up Recommendations Home health PT;Other (comment) (Pt refuses)    Equipment Recommendations  None recommended by PT;Other (comment) (Encouraged to utilize RW after discharge for safety)    Recommendations for Other Services       Precautions / Restrictions Precautions Precautions: Fall Restrictions Weight Bearing Restrictions: No      Mobility  Bed Mobility Overal bed mobility: Independent             General bed mobility comments: Fair speed/sequencing with bed mobility  Transfers Overall transfer level: Needs assistance Equipment used: Rolling walker (2 wheeled) Transfers: Sit  to/from Stand Sit to Stand: Min guard         General transfer comment: Pt requires slightly more time to come to standing indicating bilateral LE weakness. Once upright she is relatively steady in standing but does attempt to reach out and hold onto counter for additional support  Ambulation/Gait Ambulation/Gait assistance: Min guard Ambulation Distance (Feet): 220 Feet Assistive device: None Gait Pattern/deviations: Decreased step length - right;Decreased step length - left Gait velocity: Decreased but functional for limited community ambulation   General Gait Details: Pt ambulates with decreased step length bilaterally. She is stable with ambulation without overt LOB. VSS throughout ambulation and no DOE. Pt reports that mobility is close to baseline but slightly worse. Pt does have some lateral gait deviation with ambulation but able to self correct  Stairs            Wheelchair Mobility    Modified Rankin (Stroke Patients Only)       Balance Overall balance assessment: Needs assistance Sitting-balance support: No upper extremity supported Sitting balance-Leahy Scale: Good     Standing balance support: No upper extremity supported Standing balance-Leahy Scale: Fair Standing balance comment: Able to maintain feet apart and together balance without UE support. Positive [place] within 2-3 seconds. Single leg stance 2 seconds RLE and <1 second LLE.                              Pertinent Vitals/Pain Pain Assessment: No/denies pain    Home Living Family/patient expects to be discharged to:: Private residence Living Arrangements: Children;Other (Comment) (  Daughter lives with her) Available Help at Discharge: Family;Available PRN/intermittently Type of Home: House Home Access: Stairs to enter Entrance Stairs-Rails: None Entrance Stairs-Number of Steps: 2 Home Layout: One level Home Equipment: Walker - 2 wheels;Cane - single point;Bedside commode (no grab  bars)      Prior Function Level of Independence: Independent         Comments: Independent with ADLs/IADLs. Works at [employer]. Drives     Hand Dominance   Dominant Hand: Right    Extremity/Trunk Assessment   Upper Extremity Assessment: Overall WFL for tasks assessed           Lower Extremity Assessment: LLE deficits/detail   LLE Deficits / Details: Decreased L hip flexion and L knee flexion strength which is chronic prior to admission. Denies numbness/tingling in bilateral LE     Communication   Communication: No difficulties  Cognition Arousal/Alertness: Awake/alert Behavior During Therapy: WFL for tasks assessed/performed Overall Cognitive Status: Within Functional Limits for tasks assessed (AOx4)                      General Comments      Exercises     Assessment/Plan    PT Assessment Patient needs continued PT services  PT Problem List Decreased strength;Decreased activity tolerance;Decreased balance       PT Diagnosis   Altered mental status, unspecified altered mental status type  Urinary tract infection with hematuria, site unspecified  Elevated troponin  Unsteadiness on feet  Muscle weakness (generalized)    PT Treatment Interventions DME instruction;Gait training;Stair training;Therapeutic activities;Therapeutic exercise;Balance training;Neuromuscular re-education;Patient/family education    PT Goals (Current goals can be found in the Care Plan section)  Acute Rehab PT Goals Patient Stated Goal: Return to prior level of function at home PT Goal Formulation: With patient Time For Goal Achievement: 02/06/16 Potential to Achieve Goals: Good    Frequency Min 2X/week   Barriers to discharge Decreased caregiver support Daughter at work most of the day    Co-evaluation               End of Session Equipment Utilized During Treatment: Gait belt Activity Tolerance: Patient tolerated treatment well Patient left: in  bed;with call bell/phone within reach;with bed alarm set Nurse Communication: Mobility status    Functional Assessment Tool Used: clinical judgement Functional Limitation: Mobility: Walking and moving around Mobility: Walking and Moving Around Current Status [phone]): At least 20 percent but less than 40 percent impaired, limited or restricted Mobility: Walking and Moving Around Goal Status [phone]): At least 1 percent but less than 20 percent impaired, limited or restricted    Time: 0913-0933 PT Time Calculation (min) (ACUTE ONLY): 20 min   Charges:   PT Evaluation $PT Eval Low Complexity: 1 Procedure PT Treatments $Gait Training: 8-22 mins   PT G Codes:   PT G-Codes **NOT FOR INPATIENT CLASS** Functional Assessment Tool Used: clinical judgement Functional Limitation: Mobility: Walking and moving around Mobility: Walking and Moving Around Current Status [id number]): At least 20 percent but less than 40 percent impaired, limited or restricted Mobility: Walking and Moving Around Goal Status [phone]): At least 1 percent but less than 20 percent impaired, limited or restricted   Phillips Grout PT, DPT   Varshini Arrants 01/23/2016, 10:10 AM

## 2016-01-24 LAB — URINE CULTURE: Culture: 60000 — AB

## 2016-01-26 LAB — CULTURE, BLOOD (ROUTINE X 2)
Culture: NO GROWTH
Culture: NO GROWTH

## 2016-01-29 ENCOUNTER — Ambulatory Visit (INDEPENDENT_AMBULATORY_CARE_PROVIDER_SITE_OTHER): Payer: Medicare Other | Admitting: Urology

## 2016-01-29 ENCOUNTER — Encounter: Payer: Self-pay | Admitting: Urology

## 2016-01-29 VITALS — BP 155/69 | HR 69 | Ht 68.0 in | Wt 158.0 lb

## 2016-01-29 DIAGNOSIS — J984 Other disorders of lung: Secondary | ICD-10-CM | POA: Diagnosis not present

## 2016-01-29 DIAGNOSIS — R918 Other nonspecific abnormal finding of lung field: Secondary | ICD-10-CM

## 2016-01-29 DIAGNOSIS — N200 Calculus of kidney: Secondary | ICD-10-CM | POA: Diagnosis not present

## 2016-01-29 DIAGNOSIS — N3941 Urge incontinence: Secondary | ICD-10-CM | POA: Diagnosis not present

## 2016-01-29 DIAGNOSIS — C689 Malignant neoplasm of urinary organ, unspecified: Secondary | ICD-10-CM

## 2016-01-29 LAB — URINALYSIS, COMPLETE

## 2016-01-29 LAB — MICROSCOPIC EXAMINATION
BACTERIA UA: NONE SEEN
Epithelial Cells (non renal): NONE SEEN /hpf (ref 0–10)
RBC, UA: >30 /hpf — AB (ref 0–?)
WBC UA: NONE SEEN /HPF (ref 0–?)

## 2016-01-29 MED ORDER — LIDOCAINE HCL 2 % EX GEL
1.0000 "application " | Freq: Once | CUTANEOUS | Status: AC
  Administered 2016-01-29: 1 via URETHRAL

## 2016-01-29 MED ORDER — CIPROFLOXACIN HCL 500 MG PO TABS
500.0000 mg | ORAL_TABLET | Freq: Once | ORAL | Status: AC
  Administered 2016-01-29: 500 mg via ORAL

## 2016-01-29 NOTE — Progress Notes (Signed)
08/26/2015 11:14 AM   Elmer Picker 22-Jun-1935 [id number]  Referring provider: [name], MD [address] [place],  [address]  Chief Complaint  Patient presents with  . New Patient (Initial Visit)    chronic cystitis w/hematuria    HPI:  1 - Bladder Cancer - Rt trigone approx 5cm papillary mass by CT and cysto 09/2015 on eval hematuria and irritative voiding. No hydro. No pelvic adenopathy or loss of perivesical fat planes.  Now s/p TURBT for 5 cm tumor (delayed due to need for cardiac stent), right stent ureteral stent placement on 0000000 complicated by readmission for AMS/ UTI.  Completed abx few days ago.  Some residual minimal hematuria.  Overall deconditioned.  Pathology c/w HgTa diease, large tumor burden.  Muscularis propria and lamina propria are identified. Evaluation limited by cautery artifact.  2- Urge Urinary Incontinence - slowly progressive but still modest bother with occasional urge leakage. Manages with pads. Pelvic 2017 w/o prolapse. PVR about "139mL" (modest elevation). FOrmer smoker. She is on Lasix for edema and admittidly "drinks all the time", mostly caffiniated drinks.   3 - Nephrolithiasis - h/o medically passed stones previously. CT 09/2015 with 35mm LLP w/o hydro.  4 - Left Lower Lobe Lung Nodule - 1.6cm LLL nodule incidental on CT abd 09/2015. Prior 40PY smoker.  Referred to pulmonology, Dr. Alva Garnet in 10/2015 scheduled for repeat chest imaging with plan for possible biopsy but never followed up.  PMH sig for benign hyst, gastric ulcer surgery, CHF/Lasix.   Today "[place]" is seen for stent removal, f/u pathology results today.  PMH: Past Medical History  Diagnosis Date  . Hypertension   . Hyperlipemia   . Heart block     left  . Adnexal mass   . Bleeding ulcer     Surgical History: Past Surgical History  Procedure Laterality Date  . Cardiac surgery      cardiac cath  . Abdominal hysterectomy    . Stomach  surgery    . Hip surgery      left    Home Medications:    Medication List       This list is accurate as of: 08/26/15 11:14 AM.  Always use your most recent med list.               acetaminophen 325 MG tablet  Commonly known as:  TYLENOL  Take 650 mg by mouth every 6 (six) hours as needed for mild pain or headache.     albuterol 108 (90 Base) MCG/ACT inhaler  Commonly known as:  PROVENTIL HFA;VENTOLIN HFA  Inhale 2 puffs into the lungs every 6 (six) hours as needed for wheezing or shortness of breath.     clopidogrel 75 MG tablet  Commonly known as:  PLAVIX  Take 1 tablet (75 mg total) by mouth daily.     furosemide 20 MG tablet  Commonly known as:  LASIX  Take 1 tablet (20 mg total) by mouth daily. As needed for leg swelling. Take potassium when taking furosemide.     gabapentin 100 MG capsule  Commonly known as:  NEURONTIN  Take 1 capsule (100 mg total) by mouth 3 (three) times daily.     levofloxacin 500 MG tablet  Commonly known as:  LEVAQUIN  Take 1 tablet (500 mg total) by mouth daily.     metoprolol 100 MG tablet  Commonly known as:  LOPRESSOR  Take 1 tablet (100 mg total) by mouth 2 (two)  times daily.     ondansetron 8 MG disintegrating tablet  Commonly known as:  ZOFRAN ODT  Take 1 tablet (8 mg total) by mouth every 8 (eight) hours as needed for nausea or vomiting.     potassium chloride 10 MEQ tablet  Commonly known as:  K-DUR  Take 1 tablet (10 mEq total) by mouth 2 (two) times a week. Take with doses of furosemide, to keep potassium from getting too low.        Allergies: No Known Allergies  Family History: Family History  Problem Relation Age of Onset  . Stomach cancer Mother   . CVA Father   . Bladder Cancer Neg Hx   . Kidney cancer Neg Hx     Social History:  reports that she quit smoking about 17 years ago. She does not have any smokeless tobacco history on file. She reports that she does not drink alcohol or use illicit  drugs.   Physical Exam: BP 150/79 mmHg  Pulse 69  Ht 5\' 8"  (1.727 m)  Wt 154 lb 6.4 oz (70.035 kg)  BMI 23.48 kg/m2  Constitutional:  Alert and oriented, No acute distress. HEENT: [place] AT, moist mucus membranes.  Trachea midline, no masses. Cardiovascular: No clubbing, cyanosis, or edema. Respiratory: Normal respiratory effort, no increased work of breathing. GI: Abdomen is soft, nontender, nondistended, no abdominal masses. GU: Normal urethral meatus. Skin: No rashes, bruises or suspicious lesions. Neurologic: Grossly intact, no focal deficits, moving all 4 extremities. Psychiatric: Normal mood and affect.  Laboratory Data: Lab Results  Component Value Date   WBC 7.8 06/27/2015   HGB 13.6 06/27/2015   HCT 42.5 06/27/2015   MCV 88.6 06/27/2015   PLT 274 06/27/2015    Lab Results  Component Value Date   CREATININE 0.52 06/27/2015    Cystoscopy/ Stent removal procedure  Patient identification was confirmed, informed consent was obtained, and patient was prepped using Betadine solution.  Lidocaine jelly was administered per urethral meatus.    Preoperative abx where received prior to procedure.    Procedure: - Flexible cystoscope introduced, without any difficulty.   - Thorough search of the bladder revealed:    normal urethral meatus  Stent seen emanating from right ureteral orifice, grasped with stent graspers, and removed in entirety.    Moderate debris in bladder limiting visualization.  Post-Procedure: - Patient tolerated the procedure well   Assessment & Plan:    1 - Microscopic Hematuria - likely from bladder cancer and or non-obstructing renal stone as per below.   2 - Urge Urinary Incontinence - suspect this is aggravated by bladder cancer. Will focus efforts on management of this, hopefully after treatment her irritative voiding symptoms will improve.    3 - Nephrolithiasis - current stone burdent relatively small, non-obstructive, and is low risk lower  pole location. Do nor rec "pre-emptive" treatment.  4 - Bladder Cancer - Large volume HgTa disease in close proximity but not involving the right UO. Stented prophylactically which is removed today without difficulty.    Given the high-grade nature of the tumor and size, I have recommended BCG induction course and consideration of maintenance if well-tolerated.    Risk and benefits of BCG were reviewed in detail today.  Risks including worsening urinary symptoms, bladder irritation, infection, and BCG sepsis were reviewed. All of her questions were answered. She is agreeable with this plan and will be scheduled to initiate this course in approximately 4 weeks.  We'll plan for 3 month cystoscopy  thereafter.   5 - Left Lower Lobe Lung Nodule -  Previously referred to pulm (Dr. Alva Garnet), seen 10/15/15 with plans for repeat dedicated CT chest, ordered but has yet to follow up.  Overdue for study and biopsy.  Will help facilitate this.  Discussed with Dr. Alva Garnet.  Hollice Espy, MD  [facility] [address] [place], [place] [address] [phone]

## 2016-01-30 ENCOUNTER — Ambulatory Visit (INDEPENDENT_AMBULATORY_CARE_PROVIDER_SITE_OTHER): Payer: Medicare Other | Admitting: Family Medicine

## 2016-01-30 ENCOUNTER — Encounter: Payer: Self-pay | Admitting: Family Medicine

## 2016-01-30 VITALS — BP 120/70 | HR 64 | Ht 68.0 in | Wt 160.0 lb

## 2016-01-30 DIAGNOSIS — I10 Essential (primary) hypertension: Secondary | ICD-10-CM

## 2016-01-30 DIAGNOSIS — Z09 Encounter for follow-up examination after completed treatment for conditions other than malignant neoplasm: Secondary | ICD-10-CM

## 2016-01-30 NOTE — Progress Notes (Signed)
Name: Melissa Cochran   MRN: [id number]    DOB: 1936/01/30   Date:01/30/2016       Progress Note  Subjective  Chief Complaint  Chief Complaint  Patient presents with  . Hospitalization Follow-up    discharged on 01/23/16 for bladder (tumor removal)  surgery and put a stent in heart- pt doing good    Patient follow up from hospital discharge. Patient for recheck of blood pressure.    No problem-specific Assessment & Plan notes found for this encounter.   Past Medical History:  Diagnosis Date  . Adnexal mass   . Anxiety   . Arthritis   . Bladder tumor   . Bleeding ulcer   . CHF (congestive heart failure) ([place])   . CHF (congestive heart failure) ([place])   . COPD (chronic obstructive pulmonary disease) ([place])   . Coronary artery disease   . GERD (gastroesophageal reflux disease)   . Heart block    left  . History of bleeding ulcers   . Hyperlipemia   . Hypertension   . Pneumonia   . Shortness of breath dyspnea     Past Surgical History:  Procedure Laterality Date  . ABDOMINAL HYSTERECTOMY    . CARDIAC CATHETERIZATION Left 10/21/2015   Procedure: Left Heart Cath and Coronary Angiography;  Surgeon: Isaias Cowman, MD;  Location: [facility];  Service: Cardiovascular;  Laterality: Left;  . CARDIAC CATHETERIZATION N/A 10/21/2015   Procedure: Coronary Stent Intervention;  Surgeon: Isaias Cowman, MD;  Location: [facility];  Service: Cardiovascular;  Laterality: N/A;  . CARDIAC SURGERY     cardiac cath  . CYSTOSCOPY W/ RETROGRADES Bilateral 01/20/2016   Procedure: CYSTOSCOPY WITH RETROGRADE PYELOGRAM;  Surgeon: Hollice Espy, MD;  Location: [facility];  Service: Urology;  Laterality: Bilateral;  . CYSTOSCOPY WITH STENT PLACEMENT Right 01/20/2016   Procedure: CYSTOSCOPY WITH STENT PLACEMENT;  Surgeon: Hollice Espy, MD;  Location: [facility];  Service: Urology;  Laterality: Right;  . HIP SURGERY     left  . STOMACH SURGERY    . TRANSURETHRAL  RESECTION OF BLADDER TUMOR N/A 01/20/2016   Procedure: TRANSURETHRAL RESECTION OF BLADDER TUMOR (TURBT);  Surgeon: Hollice Espy, MD;  Location: [facility];  Service: Urology;  Laterality: N/A;    Family History  Problem Relation Age of Onset  . Stomach cancer Mother   . CVA Father   . Bladder Cancer Neg Hx   . Kidney cancer Neg Hx     Social History   Social History  . Marital status: Widowed    Spouse name: N/A  . Number of children: N/A  . Years of education: N/A   Occupational History  . Not on file.   Social History Main Topics  . Smoking status: Former Smoker    Packs/day: 1.00    Years: 40.00    Quit date: 06/13/1998  . Smokeless tobacco: Never Used  . Alcohol use No  . Drug use: No  . Sexual activity: Not Currently   Other Topics Concern  . Not on file   Social History Narrative  . No narrative on file    No Known Allergies   Review of Systems  Constitutional: Negative for chills, fever, malaise/fatigue and weight loss.  HENT: Negative for ear discharge, ear pain and sore throat.   Eyes: Negative for blurred vision.  Respiratory: Negative for cough, sputum production, shortness of breath and wheezing.   Cardiovascular: Negative for chest pain, palpitations and leg swelling.  Gastrointestinal: Negative  for abdominal pain, blood in stool, constipation, diarrhea, heartburn, melena and nausea.  Genitourinary: Negative for dysuria, frequency, hematuria and urgency.  Musculoskeletal: Negative for back pain, joint pain, myalgias and neck pain.  Skin: Negative for rash.  Neurological: Negative for dizziness, tingling, sensory change, focal weakness and headaches.  Endo/Heme/Allergies: Negative for environmental allergies and polydipsia. Does not bruise/bleed easily.  Psychiatric/Behavioral: Negative for depression and suicidal ideas. The patient is not nervous/anxious and does not have insomnia.      Objective  Vitals:   01/30/16 1058  BP: 120/70  Pulse: 64   Weight: 160 lb (72.6 kg)  Height: 5\' 8"  (1.727 m)    Physical Exam  Constitutional: She is well-developed, well-nourished, and in no distress. No distress.  HENT:  Head: Normocephalic and atraumatic.  Right Ear: External ear normal.  Left Ear: External ear normal.  Nose: Nose normal.  Mouth/Throat: Oropharynx is clear and moist.  Eyes: Conjunctivae and EOM are normal. Pupils are equal, round, and reactive to light. Right eye exhibits no discharge. Left eye exhibits no discharge.  Neck: Normal range of motion. Neck supple. No JVD present. No thyromegaly present.  Cardiovascular: Normal rate, regular rhythm, normal heart sounds and intact distal pulses.  Exam reveals no gallop and no friction rub.   No murmur heard. Pulmonary/Chest: Effort normal and breath sounds normal. She has no wheezes. She has no rales.  Abdominal: Soft. Bowel sounds are normal. She exhibits no mass. There is no tenderness. There is no guarding.  Musculoskeletal: Normal range of motion. She exhibits no edema.  Lymphadenopathy:    She has no cervical adenopathy.  Neurological: She is alert. She has normal reflexes.  Skin: Skin is warm and dry. She is not diaphoretic.  Psychiatric: Mood and affect normal.  Nursing note and vitals reviewed.     Assessment & Plan  Problem List Items Addressed This Visit      Cardiovascular and Mediastinum   Essential (primary) hypertension    Other Visit Diagnoses    Hospital discharge follow-up    -  Primary        Dr. Otilio Miu [facility]  01/30/16

## 2016-01-31 ENCOUNTER — Telehealth: Payer: Self-pay | Admitting: Urology

## 2016-01-31 NOTE — Telephone Encounter (Signed)
Mr. Hauger was posterior follow-up with pulmonology in July but failed to do so. She is followed by Dr. Alva Garnet.  She was supposed to have a CT scan of her lungs and see him thereafter.  Could you please arrange for the CT scan to happen and for her to see him in follow-up. I discussed this with him and he recommended contacting his office.  It is imperative that she follows up as there is a risk for a secondary malignancy in her lungs.  Hollice Espy, MD

## 2016-02-02 ENCOUNTER — Telehealth: Payer: Self-pay | Admitting: Pulmonary Disease

## 2016-02-02 NOTE — Telephone Encounter (Signed)
I spoke with Baxter Flattery today at Dr. Alva Garnet office and she said she will contact the patient and get her reschd.   Thanks, Sharyn Lull

## 2016-02-02 NOTE — Telephone Encounter (Signed)
Will keep phone message in my box, to contact patient back next week to attempt to schedule this CT Chest then. Rhonda J Cobb

## 2016-02-02 NOTE — Telephone Encounter (Signed)
Called and spoke with patient, and she is Not wanting to schedule this CT Chest at this time.  She is stating that she already has to go to so many doctors and she is not feeling well from "all those surgeries she has had". I explained to patient that she did not have to schedule for this week, however, we needed to at least get her in within a couple of weeks.  Pt still declined to schedule CT Chest and advised me to call her back next week and maybe she would feel more like having this CT Chest done.  I advised that we needed to get this done soon as this is a request from Dr. Erlene Quan as well and she is overdue for this CT Chest.    I will contact patient back next Monday 02/09/16 and attempt to schedule this CT Chest at this time along with a follow up appointment with Dr. Alva Garnet. Rhonda J Cobb

## 2016-02-16 NOTE — Telephone Encounter (Signed)
Called and spoke with patient again who advised me that she is just to sick to come for CT Chest and F/U appointment. I advised patient that Dr. Erlene Quan has requested that we get her back in to check her lungs again and to f/u with Dr. Alva Garnet. Pt stated "that she is just sick, sick and just can not come in and can not tell me a day when she will feel like coming in for CT or any other appointment." I advised the patient that we could schedule this appointment a week or so out and pt replied "that what happens if she doesn't feel like coming b/c she is sick" Advised patient that she would just need to call and cancel and then we would r/s.  Pt stated that she would call me back when she felt like she could do the CT. Advised patient that we had been contacted as well by Dr. Cherrie Gauze office to arrange CT Chest as well and that I was going to have to advise her that pt is not willing to schedule CT Chest at this time."     Contacted Dr. Cherrie Gauze office and left message for Dr. Erlene Quan of the above.  I will keep trying to contact patient to arrange this, however, I felt the need to advise Dr. Erlene Quan of this issue as well.  Rhonda J Cobb

## 2016-02-18 DIAGNOSIS — Z01810 Encounter for preprocedural cardiovascular examination: Secondary | ICD-10-CM | POA: Diagnosis not present

## 2016-02-18 DIAGNOSIS — Z955 Presence of coronary angioplasty implant and graft: Secondary | ICD-10-CM | POA: Diagnosis not present

## 2016-02-18 DIAGNOSIS — I10 Essential (primary) hypertension: Secondary | ICD-10-CM | POA: Diagnosis not present

## 2016-02-18 DIAGNOSIS — I2510 Atherosclerotic heart disease of native coronary artery without angina pectoris: Secondary | ICD-10-CM | POA: Diagnosis not present

## 2016-02-18 DIAGNOSIS — Z9889 Other specified postprocedural states: Secondary | ICD-10-CM | POA: Diagnosis not present

## 2016-02-18 DIAGNOSIS — I872 Venous insufficiency (chronic) (peripheral): Secondary | ICD-10-CM | POA: Diagnosis not present

## 2016-02-18 DIAGNOSIS — R0602 Shortness of breath: Secondary | ICD-10-CM | POA: Diagnosis not present

## 2016-03-02 NOTE — Telephone Encounter (Signed)
Have attempted to contact patient multiple times to schedule CT Chest and F/U appointment with Dr. Alva Garnet. Called and spoke with patient on 02/02/16, 02/16/16 and pt states she just can't schedule anything right now b/c she has so many appointments. Advised patient that Dr. Cherrie Gauze office was also calling and wanting [place] to set this CT Chest up. Pt refused and advised that I would have to contact Dr. Cherrie Gauze office to advise. I did contact Dr. Cherrie Gauze office and spoke to the receptionist, however, I do not think that a phone message was taken.   I have left two messages on the patient's voice mail on 02/24/16 and 03/02/16 to attempt to schedule again. Advised patient on VM that we did have an imaging facility in [place] on [address] if that would be easier for the patient. Waiting on patient to return my call. Rhonda J Cobb

## 2016-03-04 ENCOUNTER — Ambulatory Visit: Payer: Medicare Other | Admitting: Urology

## 2016-03-04 NOTE — Telephone Encounter (Signed)
Noted Thanks for your efforts on this, [name] I think we have done all that we can do on this matter.   Waunita Schooner

## 2016-03-11 ENCOUNTER — Ambulatory Visit: Payer: Medicare Other | Admitting: Urology

## 2016-03-11 ENCOUNTER — Encounter: Payer: Self-pay | Admitting: Urology

## 2016-03-11 ENCOUNTER — Telehealth: Payer: Self-pay | Admitting: Urology

## 2016-03-11 NOTE — Telephone Encounter (Signed)
Patient did not present today for her BCG treatment.  It looks like other providers have been trying to reach her and schedule follow-up appointments, but has been unsuccessful. The patient either refused to schedule her appointments or did not answer the phone. Please attempt to contact the patient again and reschedule her for her BCG. If unsuccessful, we will need to send her a certified letter.

## 2016-03-15 NOTE — Telephone Encounter (Signed)
Called and left a message for her to call back.  michelle

## 2016-03-17 ENCOUNTER — Telehealth: Payer: Self-pay | Admitting: Urology

## 2016-03-17 NOTE — Telephone Encounter (Signed)
Pt called to cancel BCG appt for tomorrow.  She said she would call next week to reschedule.  I told her she already had appt scheduled for next week.  Please advise.

## 2016-03-17 NOTE — Telephone Encounter (Signed)
I have reached out to this patient and her family and no one has returned my calls. Can we send a certified letter to the patient please?  Sharyn Lull

## 2016-03-18 ENCOUNTER — Ambulatory Visit: Payer: Medicare Other | Admitting: Urology

## 2016-03-18 NOTE — Telephone Encounter (Signed)
Certified letter sent. Tracking number [id number]

## 2016-03-25 ENCOUNTER — Encounter: Payer: Self-pay | Admitting: Urology

## 2016-03-25 ENCOUNTER — Ambulatory Visit: Payer: Medicare Other | Admitting: Urology

## 2016-04-05 NOTE — Telephone Encounter (Signed)
Have we received a signed confirmation that she received our letter?

## 2016-04-05 NOTE — Telephone Encounter (Signed)
No

## 2016-04-06 ENCOUNTER — Encounter: Payer: Self-pay | Admitting: Urology

## 2016-04-06 ENCOUNTER — Ambulatory Visit: Payer: Medicare Other | Admitting: Urology

## 2016-04-13 ENCOUNTER — Ambulatory Visit: Payer: Medicare Other | Admitting: Urology

## 2016-04-13 ENCOUNTER — Encounter: Payer: Self-pay | Admitting: Urology

## 2016-04-20 ENCOUNTER — Ambulatory Visit: Payer: Medicare Other | Admitting: Urology

## 2016-04-20 ENCOUNTER — Encounter: Payer: Self-pay | Admitting: Urology

## 2016-05-11 ENCOUNTER — Ambulatory Visit: Payer: Medicare Other | Admitting: Urology

## 2016-05-20 ENCOUNTER — Ambulatory Visit
Admission: EM | Admit: 2016-05-20 | Discharge: 2016-05-20 | Disposition: A | Discharge status: Home / Self Care | Payer: Medicare Other | Attending: Family Medicine | Admitting: Family Medicine

## 2016-05-20 DIAGNOSIS — Z0001 Encounter for general adult medical examination with abnormal findings: Secondary | ICD-10-CM | POA: Diagnosis not present

## 2016-05-20 DIAGNOSIS — I872 Venous insufficiency (chronic) (peripheral): Secondary | ICD-10-CM | POA: Insufficient documentation

## 2016-05-20 DIAGNOSIS — L03115 Cellulitis of right lower limb: Secondary | ICD-10-CM | POA: Diagnosis not present

## 2016-05-20 DIAGNOSIS — M7989 Other specified soft tissue disorders: Secondary | ICD-10-CM | POA: Diagnosis not present

## 2016-05-20 DIAGNOSIS — L03116 Cellulitis of left lower limb: Secondary | ICD-10-CM | POA: Diagnosis not present

## 2016-05-20 LAB — BASIC METABOLIC PANEL
ANION GAP: 7 (ref 5–15)
BUN: 27 mg/dL — ABNORMAL HIGH (ref 6–20)
CALCIUM: 9.5 mg/dL (ref 8.9–10.3)
CO2: 28 mmol/L (ref 22–32)
Chloride: 103 mmol/L (ref 101–111)
Creatinine, Ser: 0.63 mg/dL (ref 0.44–1.00)
Glucose, Bld: 97 mg/dL (ref 65–99)
Potassium: 4.3 mmol/L (ref 3.5–5.1)
Sodium: 138 mmol/L (ref 135–145)

## 2016-05-20 LAB — CBC WITH DIFFERENTIAL/PLATELET
BASOS ABS: 0 10*3/uL (ref 0–0.1)
BASOS PCT: 1 %
Eosinophils Absolute: 0 10*3/uL (ref 0–0.7)
Eosinophils Relative: 0 %
HEMATOCRIT: 40 % (ref 35.0–47.0)
HEMOGLOBIN: 12.6 g/dL (ref 12.0–16.0)
Lymphocytes Relative: 31 %
Lymphs Abs: 1.9 10*3/uL (ref 1.0–3.6)
MCH: 27.7 pg (ref 26.0–34.0)
MCHC: 31.5 g/dL — ABNORMAL LOW (ref 32.0–36.0)
MCV: 87.9 fL (ref 80.0–100.0)
MONOS PCT: 8 %
Monocytes Absolute: 0.5 10*3/uL (ref 0.2–0.9)
NEUTROS ABS: 3.7 10*3/uL (ref 1.4–6.5)
NEUTROS PCT: 60 %
Platelets: 257 10*3/uL (ref 150–440)
RBC: 4.55 MIL/uL (ref 3.80–5.20)
RDW: 15 % — ABNORMAL HIGH (ref 11.5–14.5)
WBC: 6.1 10*3/uL (ref 3.6–11.0)

## 2016-05-20 MED ORDER — DOXYCYCLINE HYCLATE 100 MG PO CAPS: 100.0000 mg | ORAL_CAPSULE | Freq: Two times a day (BID) | ORAL | 0 refills | Status: DC

## 2016-05-20 MED ORDER — MUPIROCIN 2 % EX OINT: TOPICAL_OINTMENT | CUTANEOUS | 0 refills | Status: DC

## 2016-05-20 MED ORDER — DOXYCYCLINE HYCLATE 100 MG PO TABS
100.0000 mg | ORAL_TABLET | Freq: Two times a day (BID) | ORAL | Status: DC
  Administered 2016-05-20: 100 mg via ORAL

## 2016-05-20 NOTE — ED Triage Notes (Signed)
Pt with bilateral feet and lower leg pain and redness "burning" in nature. Pain 8/10. Palpable pedal pulses in triage and feet are warm

## 2016-05-20 NOTE — Discharge Instructions (Signed)
Take medication as prescribed. Elevate legs.   Follow up with your primary care physician tomorrow as discussed.    Return to Urgent care or Emergency room as discussed for new or worsening concerns.

## 2016-05-20 NOTE — ED Provider Notes (Signed)
[facility] ____________________________________________  Time seen: Approximately 11:18 PM  I have reviewed the triage vital signs and the nursing notes.   HISTORY  Chief Complaint Foot Pain   HPI Melissa Cochran is a 81 y.o. female presenting for complaints of bilateral lower leg and feet redness and burning sensation. Patient reports that she has a history of similar in the past and discussed with patient reviewing past medical history, patient has a history of chronic venous insufficiency in bilateral lower extremities. Patient reports that she has chronic bilateral lower extremity swelling. Patient reports mild current swelling, but reports the redness has worsened compared to her normal. Patient reports that she normally has some skin changes to her lower legs but reports again the legs and not usually this red. Denies any atypical break in skin. Reports she normally has some callus areas to her feet that have cracks in them at the heels. Reports some intermittent scabbing to her lower ankles. Denies trauma, fall, recent injury. Denies any recent immobilization, surgeries or long trips. Denies fevers. Reports continues to eat and drink as normal. Reports she frequently elevates her legs which helps with swelling but never fully resolves. Denies any recent cut or injury with break in skin.  Patient reports elevating her legs helps with the swelling, but the redness persists.  Denies chest pain, shortness of breath, abdominal pain, recent cough, congestion. Reports continues in the right. Again denies any trauma to lower legs. Denies other complaints.  Otilio Miu, MD PCP:    Past Medical History:  Diagnosis Date  . Adnexal mass   . Anxiety   . Arthritis   . Bladder tumor   . Bleeding ulcer   . CHF (congestive heart failure) ([place])   . CHF (congestive heart failure) ([place])   . COPD (chronic obstructive pulmonary disease) ([place])   . Coronary artery disease   .  GERD (gastroesophageal reflux disease)   . Heart block    left  . History of bleeding ulcers   . Hyperlipemia   . Hypertension   . Pneumonia   . Shortness of breath dyspnea     Patient Active Problem List   Diagnosis Date Noted  . Altered mental status 01/22/2016  . S/P coronary artery stent placement 10/30/2015  . Atherosclerotic heart disease of native coronary artery without angina pectoris 10/21/2015  . Preop cardiovascular exam 10/08/2015  . SOB (shortness of breath) on exertion 10/08/2015  . Malignant neoplasm of trigone of bladder ([place]) 09/29/2015  . Mass of lower lobe of left lung, incidetnal on CT 09/2015, smoker.  09/29/2015  . Other disorders of lung 09/29/2015  . Other microscopic hematuria 08/26/2015  . Kidney stone 08/26/2015  . Urge incontinence 08/26/2015  . Pneumonia 01/24/2015  . PNA (pneumonia) 01/24/2015  . Venous insufficiency of both lower extremities 12/09/2014  . Neuropathy ([place]) 12/09/2014  . Essential (primary) hypertension 12/09/2014  . Polyneuropathy ([place]) 12/09/2014  . Chronic venous insufficiency 12/09/2014  . Adnexal mass 11/28/2013  . History of cardiac catheterization 11/28/2013  . BP (high blood pressure) 11/28/2013  . Hyperlipidemia 11/28/2013  . Other specified postprocedural states 11/28/2013    Past Surgical History:  Procedure Laterality Date  . ABDOMINAL HYSTERECTOMY    . CARDIAC CATHETERIZATION Left 10/21/2015   Procedure: Left Heart Cath and Coronary Angiography;  Surgeon: Isaias Cowman, MD;  Location: [facility];  Service: Cardiovascular;  Laterality: Left;  . CARDIAC CATHETERIZATION N/A 10/21/2015   Procedure: Coronary Stent Intervention;  Surgeon: Sheppard Coil  Paraschos, MD;  Location: [facility];  Service: Cardiovascular;  Laterality: N/A;  . CARDIAC SURGERY     cardiac cath  . CYSTOSCOPY W/ RETROGRADES Bilateral 01/20/2016   Procedure: CYSTOSCOPY WITH RETROGRADE PYELOGRAM;  Surgeon: Hollice Espy, MD;   Location: [facility];  Service: Urology;  Laterality: Bilateral;  . CYSTOSCOPY WITH STENT PLACEMENT Right 01/20/2016   Procedure: CYSTOSCOPY WITH STENT PLACEMENT;  Surgeon: Hollice Espy, MD;  Location: [facility];  Service: Urology;  Laterality: Right;  . HIP SURGERY     left  . STOMACH SURGERY    . TRANSURETHRAL RESECTION OF BLADDER TUMOR N/A 01/20/2016   Procedure: TRANSURETHRAL RESECTION OF BLADDER TUMOR (TURBT);  Surgeon: Hollice Espy, MD;  Location: [facility];  Service: Urology;  Laterality: N/A;    Current Outpatient Rx  . Order #: [id number] Class: Historical Med  . Order #: [id number] Class: Print  . Order #: [id number] Class: Print  . Order #: [id number] Class: Normal  . Order #: [id number] Class: Normal  . Order #: [id number] Class: Print  . Order #: [id number] Class: Normal  . Order #: [id number] Class: Historical Med  . Order #: [id number] Class: Normal  . Order #: [id number] Class: Normal     Current Facility-Administered Medications:  .  doxycycline (VIBRA-TABS) tablet 100 mg, 100 mg, Oral, Q12H, Marylene Land, NP, 100 mg at 05/20/16 2048  Current Outpatient Prescriptions:  .  acetaminophen (TYLENOL) 325 MG tablet, Take 650 mg by mouth every 6 (six) hours as needed for mild pain or headache. , Disp: , Rfl:  .  albuterol (PROVENTIL HFA;VENTOLIN HFA) 108 (90 BASE) MCG/ACT inhaler, Inhale 2 puffs into the lungs every 6 (six) hours as needed for wheezing or shortness of breath., Disp: 1 Inhaler, Rfl: 2 .  amLODipine (NORVASC) 10 MG tablet, Take 1 tablet (10 mg total) by mouth daily., Disp: 30 tablet, Rfl: 0 .  aspirin 81 MG chewable tablet, Chew 1 tablet (81 mg total) by mouth daily., Disp: 30 tablet, Rfl: 5 .  clopidogrel (PLAVIX) 75 MG tablet, Take 1 tablet (75 mg total) by mouth daily., Disp: 30 tablet, Rfl: 6 .  docusate sodium (COLACE) 100 MG capsule, Take 1 capsule (100 mg total) by mouth 2 (two) times daily., Disp: 60 capsule, Rfl: 0 .  doxycycline (VIBRAMYCIN) 100 MG capsule, Take 1  capsule (100 mg total) by mouth 2 (two) times daily., Disp: 20 capsule, Rfl: 0 .  hydrochlorothiazide (MICROZIDE) 12.5 MG capsule, Take 12.5 mg by mouth daily., Disp: , Rfl:  .  metoprolol (LOPRESSOR) 100 MG tablet, Take 1 tablet (100 mg total) by mouth 2 (two) times daily., Disp: 60 tablet, Rfl: 0 .  mupirocin ointment (BACTROBAN) 2 %, Apply three times a day for 5 days., Disp: 22 g, Rfl: 0  Allergies Patient has no known allergies.  Family History  Problem Relation Age of Onset  . Stomach cancer Mother   . CVA Father   . Bladder Cancer Neg Hx   . Kidney cancer Neg Hx     Social History Social History  Substance Use Topics  . Smoking status: Former Smoker    Packs/day: 1.00    Years: 40.00    Quit date: 06/13/1998  . Smokeless tobacco: Never Used  . Alcohol use No    Review of Systems Constitutional: No fever/chills Eyes: No visual changes. ENT: No sore throat. Cardiovascular: Denies chest pain. Respiratory: Denies shortness of breath. Gastrointestinal: No abdominal pain.  No nausea, no vomiting.  No diarrhea.  No constipation. Genitourinary: Negative for dysuria. Musculoskeletal: Negative  for back pain. Skin: Negative for rash.As above.  Neurological: Negative for headaches, focal weakness or numbness.  10-point ROS otherwise negative.  ____________________________________________   PHYSICAL EXAM:  VITAL SIGNS: ED Triage Vitals  Enc Vitals Group     BP 05/20/16 1746 (!) 189/67 Recheck 162/80     Pulse Rate 05/20/16 1746 93     Resp 05/20/16 1746 16     Temp 05/20/16 1746 98.7 F (37.1 C)     Temp Source 05/20/16 1746 Oral     SpO2 05/20/16 1746 98 %     Weight 05/20/16 1749 152 lb (68.9 kg)     Height 05/20/16 1749 5\' 8"  (1.727 m)     Head Circumference --      Peak Flow --      Pain Score 05/20/16 1751 8     Pain Loc --      Pain Edu? --      Excl. in [place]? --     Constitutional: Alert and oriented. Well appearing and in no acute distress. Eyes:  Conjunctivae are normal. PERRL. EOMI. ENT      Head: Normocephalic and atraumatic. Cardiovascular: Normal rate, regular rhythm. Grossly normal heart sounds.  Good peripheral circulation. Respiratory: Normal respiratory effort without tachypnea nor retractions. Breath sounds are clear and equal bilaterally. No wheezes/rales/rhonchi. Gastrointestinal: Soft and nontender.  Musculoskeletal:  No midline cervical, thoracic or lumbar tenderness to palpation. See skin below.  Neurologic:  Normal speech and language. No gross focal neurologic deficits are appreciated. Speech is normal. No gait instability.  Skin:  Skin is warm, dry and intact. No rash noted. Except : Bilateral lower extremities mid calf distally diffuse erythema, left lower extremity worse than right, with some scattered crusted lesions, no seeping, no drainage, mild induration present to posterior ankles, full range of motion present, mildly decreased distal sensation bilaterally, full range of motion to bilateral lower extremities, mild diffuse bilateral lower extremity tenderness present bilaterally from knee down. Ambulatory with steady gait. Bilateral dorsalis pedis pulses palpated easily and equally.  Psychiatric: Mood and affect are normal. Speech and behavior are normal. Patient exhibits appropriate insight and judgment   ___________________________________________   LABS (all labs ordered are listed, but only abnormal results are displayed)  Labs Reviewed  BASIC METABOLIC PANEL - Abnormal; Notable for the following:       Result Value   BUN 27 (*)    All other components within normal limits  CBC WITH DIFFERENTIAL/PLATELET - Abnormal; Notable for the following:    MCHC 31.5 (*)    RDW 15.0 (*)    All other components within normal limits    RADIOLOGY  No results found. ____________________________________________   PROCEDURES Procedures    INITIAL IMPRESSION / ASSESSMENT AND PLAN / ED COURSE  Pertinent labs &  imaging results that were available during my care of the patient were reviewed by me and considered in my medical decision making (see chart for details).  Patient well-appearing. No acute distress. Presents for complaints of bilateral lower extremity swelling, which per patient is chronic and similar to chronic, but intermittently states the swelling is worse than normal. Patient is a difficult historian and unclear in which to differentiate if swelling is worse than her chronic. However patient does clearly report that the redness present to bilateral lower extremities is worse than her normal. Patient with known history of venous insufficiency bilateral lower extremities. Suspect chronic stasis dermatitis, however discussed in detail with patient concern for secondary cellulitis.  CBC and BMP reviewed and discussed with patient.   Also discussed in detail evaluation of lower shortening the ultrasound as patient has not had one in several years and with report of increased swelling. Patient states that she cannot have ultrasound done tonight and refuses to have ultrasound performed. Patient verbalized risk and understanding of the worsening concerns and life-threatening if DVT present. Patient states that she will follow up tomorrow with her primary care physician.  Discussed with patient suspect cellulitis secondary to stasis dermatitis and venous insufficiency. Will start patient on oral doxycycline and topical Bactroban. Discussed elevation, use of her compression stockings and close PCP follow-up. Patient blood pressure also noted to be elevated today in urgent care, patient reports she did take her medications today, counseled regarding follow-up with PCP.  Discussed follow up with Primary care physician this week. Discussed follow up and return parameters including no resolution or any worsening concerns. Patient verbalized understanding and agreed to plan.    ____________________________________________   FINAL CLINICAL IMPRESSION(S) / ED DIAGNOSES  Final diagnoses:  Stasis dermatitis of both legs  Bilateral lower leg cellulitis  Leg swelling     Discharge Medication List as of 05/20/2016  8:52 PM    START taking these medications   Details  doxycycline (VIBRAMYCIN) 100 MG capsule Take 1 capsule (100 mg total) by mouth 2 (two) times daily., Starting Thu 05/20/2016, Normal    mupirocin ointment (BACTROBAN) 2 % Apply three times a day for 5 days., Normal        Note: This dictation was prepared with Dragon dictation along with smaller phrase technology. Any transcriptional errors that result from this process are unintentional.    Clinical Course       Marylene Land, NP 05/20/16 [place], NP 05/20/16 2342

## 2016-06-01 ENCOUNTER — Other Ambulatory Visit: Payer: Self-pay

## 2016-06-02 ENCOUNTER — Ambulatory Visit (INDEPENDENT_AMBULATORY_CARE_PROVIDER_SITE_OTHER): Payer: Medicare Other | Admitting: Family Medicine

## 2016-06-02 VITALS — BP 140/70 | HR 72 | Ht 68.0 in | Wt 158.0 lb

## 2016-06-02 DIAGNOSIS — I872 Venous insufficiency (chronic) (peripheral): Secondary | ICD-10-CM

## 2016-06-02 MED ORDER — SULFAMETHOXAZOLE-TRIMETHOPRIM 800-160 MG PO TABS: 1.0000 | ORAL_TABLET | Freq: Two times a day (BID) | ORAL | 0 refills | Status: DC

## 2016-06-02 NOTE — Patient Instructions (Signed)
Stasis Dermatitis Stasis dermatitis is a long-term (chronic) skin condition that happens when veins can no longer pump blood back to the heart (poor circulation). This condition causes a red or brown scaly rash or sores (ulcers) from the pooling of blood (stasis). This condition usually affects the lower legs. It may affect one leg or both legs. Without treatment, severe stasis dermatitis can lead to other skin conditions and infections. What are the causes? This condition is caused by poor circulation. What increases the risk? This condition is more likely to develop in people who:  Are not very active.  Stand for long periods of time.  Have veins that have become enlarged and twisted (varicose veins).  Have leg veins that are not strong enough to send blood back to the heart (venous insufficiency).  Have had a blood clot.  Have been pregnant many times.  Have had vein surgery.  Are obese.  Have heart or kidney failure.  Are 50 years of age or older. What are the signs or symptoms? Common early symptoms of this condition include:  Swelling in your ankle or leg. This might get better overnight but be worse again in the day.  Skin that looks thin on your ankle and leg.  Brown marks that develop slowly.  Skin that is easily irritated or cracked.  Red, swollen skin.  An achy or heavy feeling after you walk or stand for long periods of time.  Pain. Later and more severe symptoms of this condition include:  Skin that looks shiny.  Small, open sores (ulcers). These are often red or purple.  Dry, cracking skin.  Skin that feels hard.  Severe itching.  A change in the shape or color of your lower legs.  Severe pain.  Difficulty walking. How is this diagnosed? Your health care provider may suspect this condition from your symptoms and medical history. Your health care provider will also do a physical exam. You may need to see a health care provider who specializes  in skin diseases (dermatologist). You may also have tests to confirm the diagnosis, including:  Blood tests.  Imaging studies to check blood flow (Doppler ultrasound).  Allergy tests. How is this treated? Treatment for this condition may include medicine, such as:  Corticosteroid creams and ointments.  Non-corticosteroid medicines applied to the skin (topical).  Medicine to reduce swelling in the legs (diuretics).  Antibiotics.  Medicine to relieve itching (antihistamines). You may also have to wear:  Compression stockings or an elastic wrap to improve circulation.  A bandage (dressing).  A wrap that contains zinc and gelatin (Unna boot). Follow these instructions at home: Skin Care   Moisturize your skin as told by your health care provider. Do not use moisturizers with fragrance. This can irritate your skin.  Apply cool compresses to the affected areas.  Do not scratch your skin.  Do not rub your skin dry after a bath or shower. Gently pat your skin dry.  Do not use scented soaps, detergents, or perfumes. Medicines   Take or use over-the-counter and prescription medicines only as told by your health care provider.  If you were prescribed an antibiotic medicine, take or use it as told by your health care provider. Do not stop taking or using the antibiotic even if your condition starts to improve. Lifestyle   Do not stand or sit in one position for long periods of time.  Do not cross your legs when you sit.  Raise (elevate) your legs above the   level of your heart when you are sitting or lying down.  Walk as told by your health care provider. Walking increases blood flow.  Wear comfortable, loose-fitting clothing. Circulation in your legs will be worse if you wear tight pants, belts, and waistbands. General instructions   Change and remove any dressing as told by your health care provider, if this applies.  Wear compression stockings as told by your health  care provider, if this applies. These stockings help to prevent blood clots and reduce swelling in your legs.  Wear the Unna boot as told by your health care provider, if this applies.  Keep all follow-up visits as told by your health care provider. This is important. Contact a health care provider if:  Your condition does not improve with treatment.  Your condition gets worse.  You have signs of infection in the affected area. Watch for:  Swelling.  Tenderness.  Redness.  Soreness.  Warmth.  You have a fever. Get help right away if:  You notice red streaks coming from the affected area.  Your bone or joint underneath the affected area becomes painful after the skin has healed.  The affected area turns darker.  You feel a deep pain in your leg or groin.  You are short of breath. This information is not intended to replace advice given to you by your health care provider. Make sure you discuss any questions you have with your health care provider. Document Released: 08/05/2005 Document Revised: 12/23/2015 Document Reviewed: 09/11/2014 Elsevier Interactive Patient Education  2017 [employer] Inc[employer]  

## 2016-06-02 NOTE — Progress Notes (Signed)
Name: Melissa Cochran   MRN: [id number]    DOB: 1935-11-17   Date:06/02/2016       Progress Note  Subjective  Chief Complaint  Chief Complaint  Patient presents with  . Leg Pain    burning and redness in lower legs and feet- had a round of Doxy- finished that    Leg Pain   The incident occurred more than 1 week ago. There was no injury mechanism. The pain is present in the left ankle, left foot, right ankle and right foot. The quality of the pain is described as aching (burning). The pain is mild. The pain has been worsening since onset. Pertinent negatives include no tingling. The symptoms are aggravated by weight bearing. Treatments tried: doxycycline and bactroban. The treatment provided no relief.    No problem-specific Assessment & Plan notes found for this encounter.   Past Medical History:  Diagnosis Date  . Adnexal mass   . Anxiety   . Arthritis   . Bladder tumor   . Bleeding ulcer   . CHF (congestive heart failure) ([place])   . CHF (congestive heart failure) ([place])   . COPD (chronic obstructive pulmonary disease) ([place])   . Coronary artery disease   . GERD (gastroesophageal reflux disease)   . Heart block    left  . History of bleeding ulcers   . Hyperlipemia   . Hypertension   . Pneumonia   . Shortness of breath dyspnea     Past Surgical History:  Procedure Laterality Date  . ABDOMINAL HYSTERECTOMY    . CARDIAC CATHETERIZATION Left 10/21/2015   Procedure: Left Heart Cath and Coronary Angiography;  Surgeon: Isaias Cowman, MD;  Location: [facility];  Service: Cardiovascular;  Laterality: Left;  . CARDIAC CATHETERIZATION N/A 10/21/2015   Procedure: Coronary Stent Intervention;  Surgeon: Isaias Cowman, MD;  Location: [facility];  Service: Cardiovascular;  Laterality: N/A;  . CARDIAC SURGERY     cardiac cath  . CYSTOSCOPY W/ RETROGRADES Bilateral 01/20/2016   Procedure: CYSTOSCOPY WITH RETROGRADE PYELOGRAM;  Surgeon: Hollice Espy,  MD;  Location: [facility];  Service: Urology;  Laterality: Bilateral;  . CYSTOSCOPY WITH STENT PLACEMENT Right 01/20/2016   Procedure: CYSTOSCOPY WITH STENT PLACEMENT;  Surgeon: Hollice Espy, MD;  Location: [facility];  Service: Urology;  Laterality: Right;  . HIP SURGERY     left  . STOMACH SURGERY    . TRANSURETHRAL RESECTION OF BLADDER TUMOR N/A 01/20/2016   Procedure: TRANSURETHRAL RESECTION OF BLADDER TUMOR (TURBT);  Surgeon: Hollice Espy, MD;  Location: [facility];  Service: Urology;  Laterality: N/A;    Family History  Problem Relation Age of Onset  . Stomach cancer Mother   . CVA Father   . Bladder Cancer Neg Hx   . Kidney cancer Neg Hx     Social History   Social History  . Marital status: Widowed    Spouse name: N/A  . Number of children: N/A  . Years of education: N/A   Occupational History  . Not on file.   Social History Main Topics  . Smoking status: Former Smoker    Packs/day: 1.00    Years: 40.00    Quit date: 06/13/1998  . Smokeless tobacco: Never Used  . Alcohol use No  . Drug use: No  . Sexual activity: Not Currently   Other Topics Concern  . Not on file   Social History Narrative  . No narrative on file    No Known  Allergies   Review of Systems  Constitutional: Negative for chills, fever, malaise/fatigue and weight loss.  HENT: Negative for ear discharge, ear pain and sore throat.   Eyes: Negative for blurred vision.  Respiratory: Negative for cough, sputum production, shortness of breath and wheezing.   Cardiovascular: Positive for leg swelling. Negative for chest pain and palpitations.  Gastrointestinal: Negative for abdominal pain, blood in stool, constipation, diarrhea, heartburn, melena and nausea.  Genitourinary: Negative for dysuria, frequency, hematuria and urgency.  Musculoskeletal: Negative for back pain, joint pain, myalgias and neck pain.  Skin: Positive for itching and rash.  Neurological: Negative for dizziness, tingling, sensory  change, focal weakness and headaches.  Endo/Heme/Allergies: Negative for environmental allergies and polydipsia. Does not bruise/bleed easily.  Psychiatric/Behavioral: Negative for depression and suicidal ideas. The patient is not nervous/anxious and does not have insomnia.      Objective  Vitals:   06/02/16 1042  BP: 140/70  Pulse: 72  Weight: 158 lb (71.7 kg)  Height: 5\' 8"  (1.727 m)    Physical Exam  Constitutional: She is well-developed, well-nourished, and in no distress. No distress.  HENT:  Head: Normocephalic and atraumatic.  Right Ear: External ear normal.  Left Ear: External ear normal.  Nose: Nose normal.  Mouth/Throat: Oropharynx is clear and moist.  Eyes: Conjunctivae and EOM are normal. Pupils are equal, round, and reactive to light. Right eye exhibits no discharge. Left eye exhibits no discharge.  Neck: Normal range of motion. Neck supple. No JVD present. No thyromegaly present.  Cardiovascular: Normal rate, regular rhythm, normal heart sounds and intact distal pulses.  Exam reveals no gallop and no friction rub.   No murmur heard. Pulmonary/Chest: Effort normal and breath sounds normal. She has no wheezes. She has no rales.  Abdominal: Soft. Bowel sounds are normal. She exhibits no mass. There is no tenderness. There is no guarding.  Musculoskeletal: Normal range of motion. She exhibits no edema.  Lymphadenopathy:    She has no cervical adenopathy.  Neurological: She is alert. She has normal reflexes.  Skin: Skin is warm and dry. She is not diaphoretic.  Open drainage areas  Psychiatric: Mood and affect normal.  Nursing note and vitals reviewed.     Assessment & Plan  Problem List Items Addressed This Visit      Cardiovascular and Mediastinum   Venous insufficiency of both lower extremities - Primary   Relevant Orders   Ambulatory referral to Vascular Surgery    Other Visit Diagnoses    Venous stasis dermatitis of both lower extremities        referral wound care    Relevant Medications   sulfamethoxazole-trimethoprim (BACTRIM DS,SEPTRA DS) 800-160 MG tablet   Other Relevant Orders   Ambulatory referral to Vascular Surgery        Dr. Otilio [facility]  06/02/16

## 2016-06-08 ENCOUNTER — Encounter (INDEPENDENT_AMBULATORY_CARE_PROVIDER_SITE_OTHER): Payer: Self-pay | Admitting: Vascular Surgery

## 2016-06-08 ENCOUNTER — Ambulatory Visit (INDEPENDENT_AMBULATORY_CARE_PROVIDER_SITE_OTHER): Payer: Medicare Other | Admitting: Vascular Surgery

## 2016-06-08 VITALS — BP 194/88 | HR 53 | Resp 18 | Ht 68.0 in | Wt 155.0 lb

## 2016-06-08 DIAGNOSIS — I2510 Atherosclerotic heart disease of native coronary artery without angina pectoris: Secondary | ICD-10-CM

## 2016-06-08 DIAGNOSIS — M79605 Pain in left leg: Secondary | ICD-10-CM | POA: Diagnosis not present

## 2016-06-08 DIAGNOSIS — I10 Essential (primary) hypertension: Secondary | ICD-10-CM

## 2016-06-08 DIAGNOSIS — M79604 Pain in right leg: Secondary | ICD-10-CM

## 2016-06-08 DIAGNOSIS — M79609 Pain in unspecified limb: Secondary | ICD-10-CM

## 2016-06-08 DIAGNOSIS — G629 Polyneuropathy, unspecified: Secondary | ICD-10-CM | POA: Diagnosis not present

## 2016-06-08 DIAGNOSIS — M7989 Other specified soft tissue disorders: Secondary | ICD-10-CM | POA: Diagnosis not present

## 2016-06-08 DIAGNOSIS — E785 Hyperlipidemia, unspecified: Secondary | ICD-10-CM

## 2016-06-08 NOTE — Assessment & Plan Note (Signed)
Certainly congestive heart failure from heart disease could be contributing to her lower extremity swelling.

## 2016-06-08 NOTE — Assessment & Plan Note (Signed)
lipid control important in reducing the progression of atherosclerotic disease.   

## 2016-06-08 NOTE — Assessment & Plan Note (Signed)
The burning in her toes could certainly be related to neuropathy, but I'm concerned that arterial insufficiency may be present as well. Noninvasive studies to follow.

## 2016-06-08 NOTE — Assessment & Plan Note (Signed)
We have had a long talk today about the pathophysiology, natural history, and treatment options of both arterial and venous disease. Her lower extremity pain is not entirely clear in its etiology, and I suspect some degree of neuropathy and musculoskeletal issues are playing a role. Given her clinical exam and history of atherosclerotic disease with multiple atherosclerotic risk factors, I'm very concerned about arterial insufficiency and some degree of ischemic rest pain as well. We also discussed the role of venous disease particularly with leg swelling. Were going to do noninvasive studies in the near future at her convenience and then see her back to discuss the results and determine further treatment options. She will continue her aspirin and Plavix therapy currently.

## 2016-06-08 NOTE — Progress Notes (Signed)
Patient ID: Melissa Cochran, female   DOB: 1936-01-05, 81 y.o.   MRN: [id number]  Chief Complaint  Patient presents with  . New Evaluation    Venous insuff. both legs with leg swelling    HPI Melissa Cochran is a 81 y.o. female.  I am asked to see the patient by Dr. Ronnald Ramp for evaluation of pain and swelling of the lower extremities.  The patient reports about 6 months of worsening pain and swelling in both of her lower extremities. Initially, it was felt to be postoperative from her heart surgery with chronic swelling that was slow to improve. Over time, the swelling did not get better and the pain has gotten worse. The pain wakes her at night. She dangles her feet much of the time to relieve the pain. The swelling has not improved and become more prominent despite the use of diuretics and elevation. She has had several episodes where the skin will break down and weak. She does not have ulceration or infection currently, but does have a lot of scabbing on her lower legs. Her feet are always cold to the touch per her perception and I would agree with that today. She reports no trauma or injury to her legs. She reports no fever or chills. Walking any distance leads to increasing pain in her legs.   Past Medical History:  Diagnosis Date  . Adnexal mass   . Anxiety   . Arthritis   . Bladder tumor   . Bleeding ulcer   . CHF (congestive heart failure) ([place])   . CHF (congestive heart failure) ([place])   . COPD (chronic obstructive pulmonary disease) ([place])   . Coronary artery disease   . GERD (gastroesophageal reflux disease)   . Heart block    left  . History of bleeding ulcers   . Hyperlipemia   . Hypertension   . Pneumonia   . Shortness of breath dyspnea     Past Surgical History:  Procedure Laterality Date  . ABDOMINAL HYSTERECTOMY    . CARDIAC CATHETERIZATION Left 10/21/2015   Procedure: Left Heart Cath and Coronary Angiography;  Surgeon: Isaias Cowman, MD;   Location: [place] [facility];  Service: Cardiovascular;  Laterality: Left;  . CARDIAC CATHETERIZATION N/A 10/21/2015   Procedure: Coronary Stent Intervention;  Surgeon: Isaias Cowman, MD;  Location: [place] [facility];  Service: Cardiovascular;  Laterality: N/A;  . CARDIAC SURGERY     cardiac cath  . CYSTOSCOPY W/ RETROGRADES Bilateral 01/20/2016   Procedure: CYSTOSCOPY WITH RETROGRADE PYELOGRAM;  Surgeon: Hollice Espy, MD;  Location: [facility];  Service: Urology;  Laterality: Bilateral;  . CYSTOSCOPY WITH STENT PLACEMENT Right 01/20/2016   Procedure: CYSTOSCOPY WITH STENT PLACEMENT;  Surgeon: Hollice Espy, MD;  Location: [facility];  Service: Urology;  Laterality: Right;  . HIP SURGERY     left  . STOMACH SURGERY    . TRANSURETHRAL RESECTION OF BLADDER TUMOR N/A 01/20/2016   Procedure: TRANSURETHRAL RESECTION OF BLADDER TUMOR (TURBT);  Surgeon: Hollice Espy, MD;  Location: [facility];  Service: Urology;  Laterality: N/A;    Family History  Problem Relation Age of Onset  . Stomach cancer Mother   . CVA Father   . Bladder Cancer Neg Hx   . Kidney cancer Neg Hx   No bleeding disorders or clotting disorders  Social History Social History  Substance Use Topics  . Smoking status: Former Smoker    Packs/day: 1.00    Years: 40.00  Quit date: 06/13/1998  . Smokeless tobacco: Never Used  . Alcohol use No  No IV drug use  No Known Allergies  Current Outpatient Prescriptions  Medication Sig Dispense Refill  . acetaminophen (TYLENOL) 325 MG tablet Take 650 mg by mouth every 6 (six) hours as needed for mild pain or headache.     . albuterol (PROVENTIL HFA;VENTOLIN HFA) 108 (90 BASE) MCG/ACT inhaler Inhale 2 puffs into the lungs every 6 (six) hours as needed for wheezing or shortness of breath. 1 Inhaler 2  . amLODipine (NORVASC) 10 MG tablet Take 1 tablet (10 mg total) by mouth daily. 30 tablet 0  . aspirin 81 MG chewable tablet Chew 1 tablet (81 mg total) by mouth daily. 30  tablet 5  . clopidogrel (PLAVIX) 75 MG tablet Take 1 tablet (75 mg total) by mouth daily. 30 tablet 6  . docusate sodium (COLACE) 100 MG capsule Take 1 capsule (100 mg total) by mouth 2 (two) times daily. 60 capsule 0  . hydrochlorothiazide (MICROZIDE) 12.5 MG capsule Take 12.5 mg by mouth daily.    . metoprolol (LOPRESSOR) 100 MG tablet Take 1 tablet (100 mg total) by mouth 2 (two) times daily. 60 tablet 0  . sulfamethoxazole-trimethoprim (BACTRIM DS,SEPTRA DS) 800-160 MG tablet Take 1 tablet by mouth 2 (two) times daily. 20 tablet 0   No current facility-administered medications for this visit.       REVIEW OF SYSTEMS (Negative unless checked)  Constitutional: '[]'$ Weight loss  '[]'$ Fever  '[]'$ Chills Cardiac: '[]'$ Chest pain   '[]'$ Chest pressure   '[]'$ Palpitations   '[x]'$ Shortness of breath when laying flat   '[]'$ Shortness of breath at rest   '[x]'$ Shortness of breath with exertion. Vascular:  '[]'$ Pain in legs with walking   '[x]'$ Pain in legs at rest   '[]'$ Pain in legs when laying flat   '[]'$ Claudication   '[]'$ Pain in feet when walking  '[x]'$ Pain in feet at rest  '[]'$ Pain in feet when laying flat   '[]'$ History of DVT   '[]'$ Phlebitis   '[x]'$ Swelling in legs   '[]'$ Varicose veins   '[]'$ Non-healing ulcers Pulmonary:   '[]'$ Uses home oxygen   '[]'$ Productive cough   '[]'$ Hemoptysis   '[]'$ Wheeze  '[x]'$ COPD   '[]'$ Asthma Neurologic:  '[]'$ Dizziness  '[]'$ Blackouts   '[]'$ Seizures   '[]'$ History of stroke   '[]'$ History of TIA  '[]'$ Aphasia   '[]'$ Temporary blindness   '[]'$ Dysphagia   '[]'$ Weakness or numbness in arms   '[]'$ Weakness or numbness in legs Musculoskeletal:  '[]'$ Arthritis   '[]'$ Joint swelling   '[]'$ Joint pain   '[]'$ Low back pain Hematologic:  '[]'$ Easy bruising  '[]'$ Easy bleeding   '[]'$ Hypercoagulable state   '[]'$ Anemic  '[]'$ Hepatitis Gastrointestinal:  '[]'$ Blood in stool   '[]'$ Vomiting blood  '[]'$ Gastroesophageal reflux/heartburn   '[]'$ Abdominal pain Genitourinary:  '[x]'$ Chronic kidney disease   '[]'$ Difficult urination  '[]'$ Frequent urination  '[]'$ Burning with urination   '[]'$ Hematuria Skin:  '[]'$ Rashes    '[x]'$ Ulcers   '[x]'$ Wounds Psychological:  '[]'$ History of anxiety   '[]'$  History of major depression.    Physical Exam BP (!) 194/88 (BP Location: Right Arm)   Pulse (!) 53   Resp 18   Ht '5\' 8"'$  (1.727 m)   Wt 155 lb (70.3 kg)   BMI 23.57 kg/m  Gen:  WD/WN, NAD Head: [place]/AT, No temporalis wasting. Prominent temp pulse not noted. Ear/Nose/Throat: Hearing grossly intact, nares w/o erythema or drainage, oropharynx w/o Erythema/Exudate. Edentulous Eyes: Conjunctiva clear, sclera non-icteric  Neck: trachea midline.  No JVD.  Pulmonary:  Good air movement, respirations not labored, no use of accessory  muscles  Cardiac: RRR, normal S1, S2 Vascular:  Vessel Right Left  Radial Palpable Palpable  Ulnar Palpable Palpable  Brachial Palpable Palpable  Carotid Palpable, without bruit Palpable, without bruit  Aorta Not palpable N/A  Femoral Palpable Palpable  Popliteal Not Palpable Not Palpable  PT Not Palpable Not Palpable  DP Trace Palpable 1+ Palpable   Gastrointestinal: soft, non-tender/non-distended. No guarding/reflex. No masses, surgical incisions, or scars. Musculoskeletal: M/S 5/5 throughout.  Stasis dermatitis is prominent in both lower extremities with several dry scabbed areas but no draining wounds at this point. Feet are cool to the touch. Capillary refill is sluggish. Neurologic: Sensation grossly intact in extremities.  Symmetrical.  Speech is fluent. Motor exam as listed above. Psychiatric: Judgment intact, Mood & affect appropriate for pt's clinical situation. Dermatologic: No rashes or ulcers noted.  No cellulitis or open wounds. Lymph : No Cervical, Axillary, or Inguinal lymphadenopathy.   Radiology No results found.  Labs Recent Results (from the past 2160 hour(s))  Basic metabolic panel     Status: Abnormal   Collection Time: 05/20/16  8:05 PM  Result Value Ref Range   Sodium 138 135 - 145 mmol/L   Potassium 4.3 3.5 - 5.1 mmol/L   Chloride 103 101 - 111 mmol/L   CO2 28  22 - 32 mmol/L   Glucose, Bld 97 65 - 99 mg/dL   BUN 27 (H) 6 - 20 mg/dL   Creatinine, Ser 0.63 0.44 - 1.00 mg/dL   Calcium 9.5 8.9 - 10.3 mg/dL   GFR calc non Af Amer >60 >60 mL/min   GFR calc Af Amer >60 >60 mL/min    Comment: (NOTE) The eGFR has been calculated using the CKD EPI equation. This calculation has not been validated in all clinical situations. eGFR's persistently <60 mL/min signify possible Chronic Kidney Disease.    Anion gap 7 5 - 15  CBC with Differential     Status: Abnormal   Collection Time: 05/20/16  8:05 PM  Result Value Ref Range   WBC 6.1 3.6 - 11.0 K/uL   RBC 4.55 3.80 - 5.20 MIL/uL   Hemoglobin 12.6 12.0 - 16.0 g/dL   HCT 40.0 35.0 - 47.0 %   MCV 87.9 80.0 - 100.0 fL   MCH 27.7 26.0 - 34.0 pg   MCHC 31.5 (L) 32.0 - 36.0 g/dL   RDW 15.0 (H) 11.5 - 14.5 %   Platelets 257 150 - 440 K/uL   Neutrophils Relative % 60 %   Neutro Abs 3.7 1.4 - 6.5 K/uL   Lymphocytes Relative 31 %   Lymphs Abs 1.9 1.0 - 3.6 K/uL   Monocytes Relative 8 %   Monocytes Absolute 0.5 0.2 - 0.9 K/uL   Eosinophils Relative 0 %   Eosinophils Absolute 0.0 0 - 0.7 K/uL   Basophils Relative 1 %   Basophils Absolute 0.0 0 - 0.1 K/uL    Assessment/Plan:  Hyperlipidemia lipid control important in reducing the progression of atherosclerotic disease.    Neuropathy The burning in her toes could certainly be related to neuropathy, but I'm concerned that arterial insufficiency may be present as well. Noninvasive studies to follow.  BP (high blood pressure) blood pressure control important in reducing the progression of atherosclerotic disease. On appropriate oral medications.   Atherosclerotic heart disease of native coronary artery without angina pectoris Certainly congestive heart failure from heart disease could be contributing to her lower extremity swelling.  Swelling of limb Likely multifactorial, but I am concerned that  venous insufficiency could be playing a major role.  Venous reflux study will be performed in the future at her convenience after for arterial assessment.  Pain in limb We have had a long talk today about the pathophysiology, natural history, and treatment options of both arterial and venous disease. Her lower extremity pain is not entirely clear in its etiology, and I suspect some degree of neuropathy and musculoskeletal issues are playing a role. Given her clinical exam and history of atherosclerotic disease with multiple atherosclerotic risk factors, I'm very concerned about arterial insufficiency and some degree of ischemic rest pain as well. We also discussed the role of venous disease particularly with leg swelling. Were going to do noninvasive studies in the near future at her convenience and then see her back to discuss the results and determine further treatment options. She will continue her aspirin and Plavix therapy currently.      Leotis Pain 06/08/2016, 4:35 PM   This note was created with Dragon medical transcription system.  Any errors from dictation are unintentional.

## 2016-06-08 NOTE — Assessment & Plan Note (Signed)
blood pressure control important in reducing the progression of atherosclerotic disease. On appropriate oral medications.  

## 2016-06-08 NOTE — Patient Instructions (Signed)
Peripheral Vascular Disease Peripheral vascular disease (PVD) is a disease of the blood vessels that are not part of your heart and brain. A simple term for PVD is poor circulation. In most cases, PVD narrows the blood vessels that carry blood from your heart to the rest of your body. This can result in a decreased supply of blood to your arms, legs, and internal organs, like your stomach or kidneys. However, it most often affects a person's lower legs and feet. There are two types of PVD.  Organic PVD. This is the more common type. It is caused by damage to the structure of blood vessels.  Functional PVD. This is caused by conditions that make blood vessels contract and tighten (spasm).  Without treatment, PVD tends to get worse over time. PVD can also lead to acute ischemic limb. This is when an arm or limb suddenly has trouble getting enough blood. This is a medical emergency. What are the causes? Each type of PVD has many different causes. The most common cause of PVD is buildup of a fatty material (plaque) inside of your arteries (atherosclerosis). Small amounts of plaque can break off from the walls of the blood vessels and become lodged in a smaller artery. This blocks blood flow and can cause acute ischemic limb. Other common causes of PVD include:  Blood clots that form inside of blood vessels.  Injuries to blood vessels.  Diseases that cause inflammation of blood vessels or cause blood vessel spasms.  Health behaviors and health history that increase your risk of developing PVD.  What increases the risk? You may have a greater risk of PVD if you:  Have a family history of PVD.  Have certain medical conditions, including: ? High cholesterol. ? Diabetes. ? High blood pressure (hypertension). ? Coronary heart disease. ? Past problems with blood clots. ? Past injury, such as burns or a broken bone. These may have damaged blood vessels in your limbs. ? Buerger disease. This is  caused by inflamed blood vessels in your hands and feet. ? Some forms of arthritis. ? Rare birth defects that affect the arteries in your legs.  Use tobacco.  Do not get enough exercise.  Are obese.  Are age 50 or older.  What are the signs or symptoms? PVD may cause many different symptoms. Your symptoms depend on what part of your body is not getting enough blood. Some common signs and symptoms include:  Cramps in your lower legs. This may be a symptom of poor leg circulation (claudication).  Pain and weakness in your legs while you are physically active that goes away when you rest (intermittent claudication).  Leg pain when at rest.  Leg numbness, tingling, or weakness.  Coldness in a leg or foot, especially when compared with the other leg.  Skin or hair changes. These can include: ? Hair loss. ? Shiny skin. ? Pale or bluish skin. ? Thick toenails.  Inability to get or maintain an erection (erectile dysfunction).  People with PVD are more prone to developing ulcers and sores on their toes, feet, or legs. These may take longer than normal to heal. How is this diagnosed? Your health care provider may diagnose PVD from your signs and symptoms. The health care provider will also do a physical exam. You may have tests to find out what is causing your PVD and determine its severity. Tests may include:  Blood pressure recordings from your arms and legs and measurements of the strength of your pulses (  pulse volume recordings).  Imaging studies using sound waves to take pictures of the blood flow through your blood vessels (Doppler ultrasound).  Injecting a dye into your blood vessels before having imaging studies using: ? X-rays (angiogram or arteriogram). ? Computer-generated X-rays (CT angiogram). ? A powerful electromagnetic field and a computer (magnetic resonance angiogram or MRA).  How is this treated? Treatment for PVD depends on the cause of your condition and the  severity of your symptoms. It also depends on your age. Underlying causes need to be treated and controlled. These include long-lasting (chronic) conditions, such as diabetes, high cholesterol, and high blood pressure. You may need to first try making lifestyle changes and taking medicines. Surgery may be needed if these do not work. Lifestyle changes may include:  Quitting smoking.  Exercising regularly.  Following a low-fat, low-cholesterol diet.  Medicines may include:  Blood thinners to prevent blood clots.  Medicines to improve blood flow.  Medicines to improve your blood cholesterol levels.  Surgical procedures may include:  A procedure that uses an inflated balloon to open a blocked artery and improve blood flow (angioplasty).  A procedure to put in a tube (stent) to keep a blocked artery open (stent implant).  Surgery to reroute blood flow around a blocked artery (peripheral bypass surgery).  Surgery to remove dead tissue from an infected wound on the affected limb.  Amputation. This is surgical removal of the affected limb. This may be necessary in cases of acute ischemic limb that are not improved through medical or surgical treatments.  Follow these instructions at home:  Take medicines only as directed by your health care provider.  Do not use any tobacco products, including cigarettes, chewing tobacco, or electronic cigarettes. If you need help quitting, ask your health care provider.  Lose weight if you are overweight, and maintain a healthy weight as directed by your health care provider.  Eat a diet that is low in fat and cholesterol. If you need help, ask your health care provider.  Exercise regularly. Ask your health care provider to suggest some good activities for you.  Use compression stockings or other mechanical devices as directed by your health care provider.  Take good care of your feet. ? Wear comfortable shoes that fit well. ? Check your feet  often for any cuts or sores. Contact a health care provider if:  You have cramps in your legs while walking.  You have leg pain when you are at rest.  You have coldness in a leg or foot.  Your skin changes.  You have erectile dysfunction.  You have cuts or sores on your feet that are not healing. Get help right away if:  Your arm or leg turns cold and blue.  Your arms or legs become red, warm, swollen, painful, or numb.  You have chest pain or trouble breathing.  You suddenly have weakness in your face, arm, or leg.  You become very confused or lose the ability to speak.  You suddenly have a very bad headache or lose your vision. This information is not intended to replace advice given to you by your health care provider. Make sure you discuss any questions you have with your health care provider. Document Released: 06/03/2004 Document Revised: 10/02/2015 Document Reviewed: 10/04/2013 Elsevier Interactive Patient Education  2017 [employer]  

## 2016-06-08 NOTE — Assessment & Plan Note (Signed)
Likely multifactorial, but I am concerned that venous insufficiency could be playing a major role. Venous reflux study will be performed in the future at her convenience after for arterial assessment.

## 2016-06-10 ENCOUNTER — Ambulatory Visit (INDEPENDENT_AMBULATORY_CARE_PROVIDER_SITE_OTHER): Payer: Medicare Other

## 2016-06-10 ENCOUNTER — Ambulatory Visit (INDEPENDENT_AMBULATORY_CARE_PROVIDER_SITE_OTHER): Payer: Medicare Other | Admitting: Vascular Surgery

## 2016-06-10 DIAGNOSIS — M79605 Pain in left leg: Secondary | ICD-10-CM

## 2016-06-10 DIAGNOSIS — M79604 Pain in right leg: Secondary | ICD-10-CM

## 2016-06-15 ENCOUNTER — Other Ambulatory Visit: Payer: Self-pay

## 2016-06-21 NOTE — Telephone Encounter (Signed)
LMOM

## 2016-06-21 NOTE — Telephone Encounter (Signed)
Patient has recently been seen by Dr. Lucky Cowboy and Dr. Ronnald Ramp.  Maybe, she will be more receptive to BCG and CT of the chest.  Please reach out and see if she is willing to come in at this time.

## 2016-06-22 NOTE — Telephone Encounter (Signed)
LMOM

## 2016-06-23 NOTE — Telephone Encounter (Signed)
LMOM Do you want me to send another certified letter?

## 2016-06-23 NOTE — Telephone Encounter (Signed)
We probably should so we can see if we can get a conformation that she had received it.

## 2016-06-23 NOTE — Telephone Encounter (Signed)
Certified letter sent and tracking #- [id number] [phone]

## 2016-07-02 ENCOUNTER — Ambulatory Visit (INDEPENDENT_AMBULATORY_CARE_PROVIDER_SITE_OTHER): Payer: Medicare Other | Admitting: Vascular Surgery

## 2016-07-02 ENCOUNTER — Encounter (INDEPENDENT_AMBULATORY_CARE_PROVIDER_SITE_OTHER): Payer: Self-pay | Admitting: Vascular Surgery

## 2016-07-02 ENCOUNTER — Ambulatory Visit (INDEPENDENT_AMBULATORY_CARE_PROVIDER_SITE_OTHER): Payer: Medicare Other

## 2016-07-02 VITALS — BP 223/99 | HR 65 | Resp 16

## 2016-07-02 DIAGNOSIS — M79604 Pain in right leg: Secondary | ICD-10-CM

## 2016-07-02 DIAGNOSIS — I10 Essential (primary) hypertension: Secondary | ICD-10-CM

## 2016-07-02 DIAGNOSIS — I82413 Acute embolism and thrombosis of femoral vein, bilateral: Secondary | ICD-10-CM | POA: Diagnosis not present

## 2016-07-02 DIAGNOSIS — M7989 Other specified soft tissue disorders: Secondary | ICD-10-CM | POA: Diagnosis not present

## 2016-07-02 DIAGNOSIS — G629 Polyneuropathy, unspecified: Secondary | ICD-10-CM | POA: Diagnosis not present

## 2016-07-02 DIAGNOSIS — I82409 Acute embolism and thrombosis of unspecified deep veins of unspecified lower extremity: Secondary | ICD-10-CM | POA: Insufficient documentation

## 2016-07-02 DIAGNOSIS — M79605 Pain in left leg: Secondary | ICD-10-CM

## 2016-07-02 DIAGNOSIS — I2510 Atherosclerotic heart disease of native coronary artery without angina pectoris: Secondary | ICD-10-CM | POA: Diagnosis not present

## 2016-07-02 DIAGNOSIS — E785 Hyperlipidemia, unspecified: Secondary | ICD-10-CM | POA: Diagnosis not present

## 2016-07-02 MED ORDER — TRAMADOL HCL 50 MG PO TABS: 50.0000 mg | ORAL_TABLET | Freq: Four times a day (QID) | ORAL | Status: DC | PRN

## 2016-07-02 MED ORDER — APIXABAN 2.5 MG PO TABS: 5.0000 mg | ORAL_TABLET | Freq: Two times a day (BID) | ORAL | Status: DC

## 2016-07-02 NOTE — Assessment & Plan Note (Signed)
The patient had a duplex today which demonstrated bilateral acute to subacute femoral vein DVT. This would certainly explain some of her lower extremity symptoms. She needs to be started on anticoagulation. Although I think her legs would benefit from an Unna boot placement, until she has been on anticoagulation for several days I do not feel safe. Going to come back next week for an Haematologist. She will continue her anticoagulation and I have given her tramadol for pain. She will have a follow-up visit in 1 month.

## 2016-07-02 NOTE — Assessment & Plan Note (Signed)
Likely from her DVT.  See plan as below.

## 2016-07-02 NOTE — Progress Notes (Signed)
MRN : [id number]  Melissa Cochran is a 81 y.o. ([birth year]) female who presents with chief complaint of  Chief Complaint  Patient presents with  . Follow-up  .  History of Present Illness: Patient returns today in follow up of Her leg pain and swelling. This is no better than it was and actually she thinks is worse. She says she is miserable. Both lower extremities are affected. She has shallow ulcerations of both feet and ankles. Her legs are swollen and erythematous. The patient had a duplex today which demonstrated bilateral acute to subacute femoral vein DVT. This would certainly explain some of her lower extremity symptoms. She needs to be started on anticoagulation. Although I think her legs would benefit from an Unna boot placement, until she has been on anticoagulation for several days I do not feel safe. Going to come back next week for an Haematologist. She will continue her anticoagulation and I have given her tramadol for pain. She will have a follow-up visit in 1 month.         Past Medical History:  Diagnosis Date  . Adnexal mass   . Anxiety   . Arthritis   . Bladder tumor   . Bleeding ulcer   . CHF (congestive heart failure) ([place])   . CHF (congestive heart failure) ([place])   . COPD (chronic obstructive pulmonary disease) ([place])   . Coronary artery disease   . GERD (gastroesophageal reflux disease)   . Heart block    left  . History of bleeding ulcers   . Hyperlipemia   . Hypertension   . Pneumonia   . Shortness of breath dyspnea          Past Surgical History:  Procedure Laterality Date  . ABDOMINAL HYSTERECTOMY    . CARDIAC CATHETERIZATION Left 10/21/2015   Procedure: Left Heart Cath and Coronary Angiography;  Surgeon: Isaias Cowman, MD;  Location: [facility];  Service: Cardiovascular;  Laterality: Left;  . CARDIAC CATHETERIZATION N/A 10/21/2015   Procedure: Coronary Stent Intervention;  Surgeon: Isaias Cowman,  MD;  Location: [facility];  Service: Cardiovascular;  Laterality: N/A;  . CARDIAC SURGERY     cardiac cath  . CYSTOSCOPY W/ RETROGRADES Bilateral 01/20/2016   Procedure: CYSTOSCOPY WITH RETROGRADE PYELOGRAM;  Surgeon: Hollice Espy, MD;  Location: [facility];  Service: Urology;  Laterality: Bilateral;  . CYSTOSCOPY WITH STENT PLACEMENT Right 01/20/2016   Procedure: CYSTOSCOPY WITH STENT PLACEMENT;  Surgeon: Hollice Espy, MD;  Location: [facility];  Service: Urology;  Laterality: Right;  . HIP SURGERY     left  . STOMACH SURGERY    . TRANSURETHRAL RESECTION OF BLADDER TUMOR N/A 01/20/2016   Procedure: TRANSURETHRAL RESECTION OF BLADDER TUMOR (TURBT);  Surgeon: Hollice Espy, MD;  Location: [facility];  Service: Urology;  Laterality: N/A;         Family History  Problem Relation Age of Onset  . Stomach cancer Mother   . CVA Father   . Bladder Cancer Neg Hx   . Kidney cancer Neg Hx   No bleeding disorders or clotting disorders  Social History      Social History  Substance Use Topics  . Smoking status: Former Smoker    Packs/day: 1.00    Years: 40.00    Quit date: 06/13/1998  . Smokeless tobacco: Never Used  . Alcohol use No  No IV drug use  No Known Allergies        Current Outpatient  Prescriptions  Medication Sig Dispense Refill  . acetaminophen (TYLENOL) 325 MG tablet Take 650 mg by mouth every 6 (six) hours as needed for mild pain or headache.     . albuterol (PROVENTIL HFA;VENTOLIN HFA) 108 (90 BASE) MCG/ACT inhaler Inhale 2 puffs into the lungs every 6 (six) hours as needed for wheezing or shortness of breath. 1 Inhaler 2  . amLODipine (NORVASC) 10 MG tablet Take 1 tablet (10 mg total) by mouth daily. 30 tablet 0  . aspirin 81 MG chewable tablet Chew 1 tablet (81 mg total) by mouth daily. 30 tablet 5  . clopidogrel (PLAVIX) 75 MG tablet Take 1 tablet (75 mg total) by mouth daily. 30 tablet 6  . docusate sodium (COLACE) 100 MG capsule Take  1 capsule (100 mg total) by mouth 2 (two) times daily. 60 capsule 0  . hydrochlorothiazide (MICROZIDE) 12.5 MG capsule Take 12.5 mg by mouth daily.    . metoprolol (LOPRESSOR) 100 MG tablet Take 1 tablet (100 mg total) by mouth 2 (two) times daily. 60 tablet 0  . sulfamethoxazole-trimethoprim (BACTRIM DS,SEPTRA DS) 800-160 MG tablet Take 1 tablet by mouth 2 (two) times daily. 20 tablet 0   No current facility-administered medications for this visit.       REVIEW OF SYSTEMS (Negative unless checked)  Constitutional: '[]'$ Weight loss  '[]'$ Fever  '[]'$ Chills Cardiac: '[]'$ Chest pain   '[]'$ Chest pressure   '[]'$ Palpitations   '[x]'$ Shortness of breath when laying flat   '[]'$ Shortness of breath at rest   '[x]'$ Shortness of breath with exertion. Vascular:  '[]'$ Pain in legs with walking   '[x]'$ Pain in legs at rest   '[]'$ Pain in legs when laying flat   '[]'$ Claudication   '[]'$ Pain in feet when walking  '[x]'$ Pain in feet at rest  '[]'$ Pain in feet when laying flat   '[]'$ History of DVT   '[]'$ Phlebitis   '[x]'$ Swelling in legs   '[]'$ Varicose veins   '[]'$ Non-healing ulcers Pulmonary:   '[]'$ Uses home oxygen   '[]'$ Productive cough   '[]'$ Hemoptysis   '[]'$ Wheeze  '[x]'$ COPD   '[]'$ Asthma Neurologic:  '[]'$ Dizziness  '[]'$ Blackouts   '[]'$ Seizures   '[]'$ History of stroke   '[]'$ History of TIA  '[]'$ Aphasia   '[]'$ Temporary blindness   '[]'$ Dysphagia   '[]'$ Weakness or numbness in arms   '[]'$ Weakness or numbness in legs Musculoskeletal:  '[]'$ Arthritis   '[]'$ Joint swelling   '[]'$ Joint pain   '[]'$ Low back pain Hematologic:  '[]'$ Easy bruising  '[]'$ Easy bleeding   '[]'$ Hypercoagulable state   '[]'$ Anemic  '[]'$ Hepatitis Gastrointestinal:  '[]'$ Blood in stool   '[]'$ Vomiting blood  '[]'$ Gastroesophageal reflux/heartburn   '[]'$ Abdominal pain Genitourinary:  '[x]'$ Chronic kidney disease   '[]'$ Difficult urination  '[]'$ Frequent urination  '[]'$ Burning with urination   '[]'$ Hematuria Skin:  '[]'$ Rashes   '[x]'$ Ulcers   '[x]'$ Wounds Psychological:  '[]'$ History of anxiety   '[]'$  History of major depression.    Physical Exam BP (!) 194/88 (BP Location:  Right Arm)   Pulse (!) 53   Resp 18   Ht '5\' 8"'$  (1.727 m)   Wt 155 lb (70.3 kg)   BMI 23.57 kg/m  Gen:  WD/WN, NAD Head: [place]/AT, No temporalis wasting. Prominent temp pulse not noted. Ear/Nose/Throat: Hearing grossly intact, nares w/o erythema or drainage, oropharynx w/o Erythema/Exudate. Edentulous Eyes: Conjunctiva clear, sclera non-icteric  Neck: trachea midline.  No JVD.  Pulmonary:  Good air movement, respirations not labored, no use of accessory muscles  Cardiac: RRR, normal S1, S2 Vascular:  Vessel Right Left  Radial Palpable Palpable  Ulnar Palpable Palpable  Brachial Palpable Palpable  Carotid  Palpable, without bruit Palpable, without bruit  Aorta Not palpable N/A  Femoral Palpable Palpable  Popliteal Not Palpable Not Palpable  PT Not Palpable Not Palpable  DP Trace Palpable 1+ Palpable   Gastrointestinal: soft, non-tender/non-distended. No guarding/reflex. No masses, surgical incisions, or scars. Musculoskeletal: M/S 5/5 throughout.  Stasis dermatitis is prominent in both lower extremities with several dry scabbed areas but no draining wounds at this point. Feet are cool to the touch. Capillary refill is sluggish. 2+ right lower extremity edema and 2-3+ left lower extremity edema. Neurologic: Sensation grossly intact in extremities.  Symmetrical.  Speech is fluent. Motor exam as listed above. Psychiatric: Judgment intact, Mood & affect appropriate for pt's clinical situation. Dermatologic: No rashes or ulcers noted.  No cellulitis or open wounds. Lymph : No Cervical, Axillary, or Inguinal lymphadenopathy.     Labs Recent Results (from the past 2160 hour(s))  Basic metabolic panel     Status: Abnormal   Collection Time: 05/20/16  8:05 PM  Result Value Ref Range   Sodium 138 135 - 145 mmol/L   Potassium 4.3 3.5 - 5.1 mmol/L   Chloride 103 101 - 111 mmol/L   CO2 28 22 - 32 mmol/L   Glucose, Bld 97 65 - 99 mg/dL   BUN 27 (H) 6 - 20 mg/dL   Creatinine, Ser 0.63  0.44 - 1.00 mg/dL   Calcium 9.5 8.9 - 10.3 mg/dL   GFR calc non Af Amer >60 >60 mL/min   GFR calc Af Amer >60 >60 mL/min    Comment: (NOTE) The eGFR has been calculated using the CKD EPI equation. This calculation has not been validated in all clinical situations. eGFR's persistently <60 mL/min signify possible Chronic Kidney Disease.    Anion gap 7 5 - 15  CBC with Differential     Status: Abnormal   Collection Time: 05/20/16  8:05 PM  Result Value Ref Range   WBC 6.1 3.6 - 11.0 K/uL   RBC 4.55 3.80 - 5.20 MIL/uL   Hemoglobin 12.6 12.0 - 16.0 g/dL   HCT 40.0 35.0 - 47.0 %   MCV 87.9 80.0 - 100.0 fL   MCH 27.7 26.0 - 34.0 pg   MCHC 31.5 (L) 32.0 - 36.0 g/dL   RDW 15.0 (H) 11.5 - 14.5 %   Platelets 257 150 - 440 K/uL   Neutrophils Relative % 60 %   Neutro Abs 3.7 1.4 - 6.5 K/uL   Lymphocytes Relative 31 %   Lymphs Abs 1.9 1.0 - 3.6 K/uL   Monocytes Relative 8 %   Monocytes Absolute 0.5 0.2 - 0.9 K/uL   Eosinophils Relative 0 %   Eosinophils Absolute 0.0 0 - 0.7 K/uL   Basophils Relative 1 %   Basophils Absolute 0.0 0 - 0.1 K/uL    Radiology No results found.   Assessment/Plan  Hyperlipidemia lipid control important in reducing the progression of atherosclerotic disease.    Neuropathy The burning in her toes could certainly be related to neuropathy, but I'm concerned that arterial insufficiency may be present as well. Noninvasive studies to follow.  BP (high blood pressure) blood pressure control important in reducing the progression of atherosclerotic disease. On appropriate oral medications.   Atherosclerotic heart disease of native coronary artery without angina pectoris Certainly congestive heart failure from heart disease could be contributing to her lower extremity swelling.  Swelling of limb Likely from her DVT.  See plan as below.  Pain in limb Likely from  DVT.  See  plan below.  DVT (deep venous thrombosis) ([place]) The patient had a duplex today  which demonstrated bilateral acute to subacute femoral vein DVT. This would certainly explain some of her lower extremity symptoms. She needs to be started on anticoagulation. Although I think her legs would benefit from an Unna boot placement, until she has been on anticoagulation for several days I do not feel safe. Going to come back next week for an Haematologist. She will continue her anticoagulation and I have given her tramadol for pain. She will have a follow-up visit in 1 month.    Leotis Pain, MD  07/02/2016 5:33 PM    This note was created with Dragon medical transcription system.  Any errors from dictation are purely unintentional

## 2016-07-02 NOTE — Assessment & Plan Note (Signed)
Likely from  DVT.  See plan below.

## 2016-07-05 ENCOUNTER — Telehealth: Payer: Self-pay

## 2016-07-05 NOTE — Telephone Encounter (Signed)
Pt signed for certified letter on [id number].

## 2016-07-08 ENCOUNTER — Emergency Department: Payer: Medicare Other

## 2016-07-08 ENCOUNTER — Inpatient Hospital Stay: Payer: Medicare Other

## 2016-07-08 ENCOUNTER — Inpatient Hospital Stay
Admission: EM | Admit: 2016-07-08 | Discharge: 2016-07-10 | DRG: 078 | Disposition: A | Discharge status: Home / Self Care | Payer: Medicare Other | Attending: Specialist | Admitting: Specialist

## 2016-07-08 ENCOUNTER — Encounter: Payer: Self-pay | Admitting: Emergency Medicine

## 2016-07-08 ENCOUNTER — Ambulatory Visit (INDEPENDENT_AMBULATORY_CARE_PROVIDER_SITE_OTHER): Payer: Medicare Other | Admitting: Vascular Surgery

## 2016-07-08 ENCOUNTER — Encounter (INDEPENDENT_AMBULATORY_CARE_PROVIDER_SITE_OTHER): Payer: Self-pay

## 2016-07-08 VITALS — BP 213/96 | HR 66 | Resp 16 | Wt 155.0 lb

## 2016-07-08 DIAGNOSIS — I82401 Acute embolism and thrombosis of unspecified deep veins of right lower extremity: Secondary | ICD-10-CM | POA: Diagnosis present

## 2016-07-08 DIAGNOSIS — I248 Other forms of acute ischemic heart disease: Secondary | ICD-10-CM | POA: Diagnosis present

## 2016-07-08 DIAGNOSIS — G9340 Encephalopathy, unspecified: Secondary | ICD-10-CM | POA: Diagnosis not present

## 2016-07-08 DIAGNOSIS — K219 Gastro-esophageal reflux disease without esophagitis: Secondary | ICD-10-CM | POA: Diagnosis present

## 2016-07-08 DIAGNOSIS — E871 Hypo-osmolality and hyponatremia: Secondary | ICD-10-CM | POA: Diagnosis present

## 2016-07-08 DIAGNOSIS — Z7982 Long term (current) use of aspirin: Secondary | ICD-10-CM

## 2016-07-08 DIAGNOSIS — I2510 Atherosclerotic heart disease of native coronary artery without angina pectoris: Secondary | ICD-10-CM

## 2016-07-08 DIAGNOSIS — Z87442 Personal history of urinary calculi: Secondary | ICD-10-CM

## 2016-07-08 DIAGNOSIS — R4182 Altered mental status, unspecified: Secondary | ICD-10-CM

## 2016-07-08 DIAGNOSIS — N200 Calculus of kidney: Secondary | ICD-10-CM | POA: Diagnosis not present

## 2016-07-08 DIAGNOSIS — Z800 Family history of malignant neoplasm of digestive organs: Secondary | ICD-10-CM | POA: Diagnosis not present

## 2016-07-08 DIAGNOSIS — I872 Venous insufficiency (chronic) (peripheral): Secondary | ICD-10-CM | POA: Diagnosis not present

## 2016-07-08 DIAGNOSIS — E785 Hyperlipidemia, unspecified: Secondary | ICD-10-CM | POA: Diagnosis present

## 2016-07-08 DIAGNOSIS — R05 Cough: Secondary | ICD-10-CM | POA: Diagnosis not present

## 2016-07-08 DIAGNOSIS — R000 Tachycardia, unspecified: Secondary | ICD-10-CM | POA: Diagnosis present

## 2016-07-08 DIAGNOSIS — I158 Other secondary hypertension: Secondary | ICD-10-CM | POA: Diagnosis present

## 2016-07-08 DIAGNOSIS — Z96642 Presence of left artificial hip joint: Secondary | ICD-10-CM | POA: Diagnosis present

## 2016-07-08 DIAGNOSIS — Z86718 Personal history of other venous thrombosis and embolism: Secondary | ICD-10-CM

## 2016-07-08 DIAGNOSIS — Z7901 Long term (current) use of anticoagulants: Secondary | ICD-10-CM

## 2016-07-08 DIAGNOSIS — F419 Anxiety disorder, unspecified: Secondary | ICD-10-CM | POA: Diagnosis present

## 2016-07-08 DIAGNOSIS — R410 Disorientation, unspecified: Secondary | ICD-10-CM | POA: Diagnosis not present

## 2016-07-08 DIAGNOSIS — R748 Abnormal levels of other serum enzymes: Secondary | ICD-10-CM | POA: Diagnosis present

## 2016-07-08 DIAGNOSIS — L97309 Non-pressure chronic ulcer of unspecified ankle with unspecified severity: Secondary | ICD-10-CM

## 2016-07-08 DIAGNOSIS — I493 Ventricular premature depolarization: Secondary | ICD-10-CM | POA: Diagnosis present

## 2016-07-08 DIAGNOSIS — Z823 Family history of stroke: Secondary | ICD-10-CM

## 2016-07-08 DIAGNOSIS — Z87891 Personal history of nicotine dependence: Secondary | ICD-10-CM | POA: Diagnosis not present

## 2016-07-08 DIAGNOSIS — R443 Hallucinations, unspecified: Secondary | ICD-10-CM | POA: Diagnosis present

## 2016-07-08 DIAGNOSIS — C670 Malignant neoplasm of trigone of bladder: Secondary | ICD-10-CM | POA: Diagnosis not present

## 2016-07-08 DIAGNOSIS — J40 Bronchitis, not specified as acute or chronic: Secondary | ICD-10-CM | POA: Diagnosis not present

## 2016-07-08 DIAGNOSIS — Z8551 Personal history of malignant neoplasm of bladder: Secondary | ICD-10-CM

## 2016-07-08 DIAGNOSIS — Z955 Presence of coronary angioplasty implant and graft: Secondary | ICD-10-CM

## 2016-07-08 DIAGNOSIS — Z90710 Acquired absence of both cervix and uterus: Secondary | ICD-10-CM | POA: Diagnosis not present

## 2016-07-08 DIAGNOSIS — I10 Essential (primary) hypertension: Secondary | ICD-10-CM | POA: Diagnosis not present

## 2016-07-08 DIAGNOSIS — J449 Chronic obstructive pulmonary disease, unspecified: Secondary | ICD-10-CM | POA: Diagnosis present

## 2016-07-08 DIAGNOSIS — R311 Benign essential microscopic hematuria: Secondary | ICD-10-CM | POA: Diagnosis not present

## 2016-07-08 DIAGNOSIS — I700 Atherosclerosis of aorta: Secondary | ICD-10-CM | POA: Diagnosis present

## 2016-07-08 DIAGNOSIS — I25110 Atherosclerotic heart disease of native coronary artery with unstable angina pectoris: Secondary | ICD-10-CM | POA: Diagnosis not present

## 2016-07-08 DIAGNOSIS — I674 Hypertensive encephalopathy: Principal | ICD-10-CM | POA: Diagnosis present

## 2016-07-08 DIAGNOSIS — Z7902 Long term (current) use of antithrombotics/antiplatelets: Secondary | ICD-10-CM

## 2016-07-08 DIAGNOSIS — Z79899 Other long term (current) drug therapy: Secondary | ICD-10-CM

## 2016-07-08 DIAGNOSIS — I83003 Varicose veins of unspecified lower extremity with ulcer of ankle: Secondary | ICD-10-CM

## 2016-07-08 LAB — CBC WITH DIFFERENTIAL/PLATELET
BASOS ABS: 0 10*3/uL (ref 0–0.1)
Basophils Relative: 0 %
EOS PCT: 0 %
Eosinophils Absolute: 0 10*3/uL (ref 0–0.7)
HEMATOCRIT: 39 % (ref 35.0–47.0)
Hemoglobin: 13.2 g/dL (ref 12.0–16.0)
LYMPHS ABS: 1.1 10*3/uL (ref 1.0–3.6)
LYMPHS PCT: 13 %
MCH: 29.5 pg (ref 26.0–34.0)
MCHC: 33.7 g/dL (ref 32.0–36.0)
MCV: 87.5 fL (ref 80.0–100.0)
MONO ABS: 0.8 10*3/uL (ref 0.2–0.9)
MONOS PCT: 10 %
NEUTROS ABS: 6.3 10*3/uL (ref 1.4–6.5)
Neutrophils Relative %: 77 %
PLATELETS: 281 10*3/uL (ref 150–440)
RBC: 4.46 MIL/uL (ref 3.80–5.20)
RDW: 14.9 % — AB (ref 11.5–14.5)
WBC: 8.2 10*3/uL (ref 3.6–11.0)

## 2016-07-08 LAB — URINALYSIS, ROUTINE W REFLEX MICROSCOPIC
BILIRUBIN URINE: NEGATIVE
Bacteria, UA: NONE SEEN
GLUCOSE, UA: NEGATIVE mg/dL
Ketones, ur: 20 mg/dL — AB
LEUKOCYTES UA: NEGATIVE
Nitrite: NEGATIVE
PH: 6 (ref 5.0–8.0)
PROTEIN: 30 mg/dL — AB
Specific Gravity, Urine: 1.015 (ref 1.005–1.030)

## 2016-07-08 LAB — COMPREHENSIVE METABOLIC PANEL
ALK PHOS: 116 U/L (ref 38–126)
ALT: 17 U/L (ref 14–54)
AST: 45 U/L — AB (ref 15–41)
Albumin: 3.7 g/dL (ref 3.5–5.0)
Anion gap: 8 (ref 5–15)
BUN: 18 mg/dL (ref 6–20)
CALCIUM: 9.5 mg/dL (ref 8.9–10.3)
CHLORIDE: 98 mmol/L — AB (ref 101–111)
CO2: 27 mmol/L (ref 22–32)
CREATININE: 0.59 mg/dL (ref 0.44–1.00)
GFR calc non Af Amer: >60 mL/min (ref >60)
GLUCOSE: 109 mg/dL — AB (ref 65–99)
Potassium: 4.3 mmol/L (ref 3.5–5.1)
SODIUM: 133 mmol/L — AB (ref 135–145)
Total Bilirubin: 1.3 mg/dL — ABNORMAL HIGH (ref 0.3–1.2)
Total Protein: 7.1 g/dL (ref 6.5–8.1)

## 2016-07-08 LAB — MAGNESIUM: Magnesium: 1.8 mg/dL (ref 1.7–2.4)

## 2016-07-08 LAB — LIPASE, BLOOD: Lipase: 18 U/L (ref 11–51)

## 2016-07-08 LAB — PROTIME-INR
INR: 1.26
Prothrombin Time: 15.9 seconds — ABNORMAL HIGH (ref 11.4–15.2)

## 2016-07-08 LAB — TROPONIN I
TROPONIN I: 0.04 ng/mL — AB (ref <0.03)
Troponin I: 0.08 ng/mL (ref <0.03)

## 2016-07-08 LAB — LACTIC ACID, PLASMA: LACTIC ACID, VENOUS: 1.4 mmol/L (ref 0.5–1.9)

## 2016-07-08 LAB — PROCALCITONIN: Procalcitonin: <0.1 ng/mL

## 2016-07-08 MED ORDER — ONDANSETRON HCL 4 MG PO TABS: 4.0000 mg | ORAL_TABLET | Freq: Four times a day (QID) | ORAL | Status: DC | PRN

## 2016-07-08 MED ORDER — TRAMADOL HCL 50 MG PO TABS
100.0000 mg | ORAL_TABLET | Freq: Four times a day (QID) | ORAL | Status: DC | PRN
  Administered 2016-07-08: 100 mg via ORAL
  Filled 2016-07-08: qty 2

## 2016-07-08 MED ORDER — ALBUTEROL SULFATE (2.5 MG/3ML) 0.083% IN NEBU: 2.5000 mg | INHALATION_SOLUTION | RESPIRATORY_TRACT | Status: DC | PRN

## 2016-07-08 MED ORDER — ONDANSETRON HCL 4 MG/2ML IJ SOLN
4.0000 mg | Freq: Four times a day (QID) | INTRAMUSCULAR | Status: DC | PRN
Start: 2016-07-08 — End: 2016-07-10

## 2016-07-08 MED ORDER — SODIUM CHLORIDE 0.9 % IV BOLUS (SEPSIS)
1000.0000 mL | Freq: Once | INTRAVENOUS | Status: AC
  Administered 2016-07-08: 1000 mL via INTRAVENOUS

## 2016-07-08 MED ORDER — ASPIRIN 81 MG PO CHEW
81.0000 mg | CHEWABLE_TABLET | Freq: Every day | ORAL | Status: DC
  Administered 2016-07-09 – 2016-07-10 (×2): 81 mg via ORAL
  Filled 2016-07-08 (×2): qty 1

## 2016-07-08 MED ORDER — METOPROLOL TARTRATE 5 MG/5ML IV SOLN
5.0000 mg | INTRAVENOUS | Status: DC | PRN
  Administered 2016-07-08: 5 mg via INTRAVENOUS

## 2016-07-08 MED ORDER — SENNOSIDES-DOCUSATE SODIUM 8.6-50 MG PO TABS: 1.0000 | ORAL_TABLET | Freq: Every evening | ORAL | Status: DC | PRN

## 2016-07-08 MED ORDER — DEXTROSE 5 % IV SOLN
2.0000 g | Freq: Once | INTRAVENOUS | Status: AC
  Administered 2016-07-08: 2 g via INTRAVENOUS
  Filled 2016-07-08: qty 2

## 2016-07-08 MED ORDER — APIXABAN 5 MG PO TABS
5.0000 mg | ORAL_TABLET | Freq: Two times a day (BID) | ORAL | Status: DC
  Administered 2016-07-08 – 2016-07-10 (×4): 5 mg via ORAL
  Filled 2016-07-08 (×4): qty 1

## 2016-07-08 MED ORDER — ACETAMINOPHEN 325 MG PO TABS
650.0000 mg | ORAL_TABLET | Freq: Four times a day (QID) | ORAL | Status: DC | PRN
  Administered 2016-07-09: 650 mg via ORAL
  Filled 2016-07-08: qty 2

## 2016-07-08 MED ORDER — DOCUSATE SODIUM 100 MG PO CAPS
100.0000 mg | ORAL_CAPSULE | Freq: Two times a day (BID) | ORAL | Status: DC
  Administered 2016-07-08 – 2016-07-10 (×4): 100 mg via ORAL
  Filled 2016-07-08 (×4): qty 1

## 2016-07-08 MED ORDER — METOPROLOL TARTRATE 50 MG PO TABS
100.0000 mg | ORAL_TABLET | Freq: Two times a day (BID) | ORAL | Status: DC
  Administered 2016-07-08 – 2016-07-10 (×4): 100 mg via ORAL
  Filled 2016-07-08 (×4): qty 2

## 2016-07-08 MED ORDER — DEXTROSE 5 % IV SOLN: 2.0000 g | Freq: Once | INTRAVENOUS | Status: DC

## 2016-07-08 MED ORDER — IOPAMIDOL (ISOVUE-370) INJECTION 76%
75.0000 mL | Freq: Once | INTRAVENOUS | Status: AC | PRN
  Administered 2016-07-08: 75 mL via INTRAVENOUS

## 2016-07-08 MED ORDER — AMLODIPINE BESYLATE 10 MG PO TABS
10.0000 mg | ORAL_TABLET | Freq: Every day | ORAL | Status: DC
  Administered 2016-07-09 – 2016-07-10 (×2): 10 mg via ORAL
  Filled 2016-07-08 (×2): qty 1

## 2016-07-08 MED ORDER — SODIUM CHLORIDE 0.9 % IV SOLN
INTRAVENOUS | Status: DC
Start: 2016-07-08 — End: 2016-07-09
  Administered 2016-07-08: 21:00:00 via INTRAVENOUS

## 2016-07-08 MED ORDER — SODIUM CHLORIDE 0.9 % IV BOLUS (SEPSIS)
250.0000 mL | Freq: Once | INTRAVENOUS | Status: AC
  Administered 2016-07-08: 250 mL via INTRAVENOUS

## 2016-07-08 MED ORDER — ACETAMINOPHEN 650 MG RE SUPP: 650.0000 mg | Freq: Four times a day (QID) | RECTAL | Status: DC | PRN

## 2016-07-08 MED ORDER — ALBUTEROL SULFATE HFA 108 (90 BASE) MCG/ACT IN AERS: 2.0000 | INHALATION_SPRAY | Freq: Four times a day (QID) | RESPIRATORY_TRACT | Status: DC | PRN

## 2016-07-08 MED ORDER — METOPROLOL TARTRATE 5 MG/5ML IV SOLN
INTRAVENOUS | Status: AC
  Filled 2016-07-08: qty 5

## 2016-07-08 MED ORDER — HYDRALAZINE HCL 20 MG/ML IJ SOLN
10.0000 mg | Freq: Four times a day (QID) | INTRAMUSCULAR | Status: DC | PRN
  Administered 2016-07-08: 10 mg via INTRAVENOUS
  Filled 2016-07-08: qty 1

## 2016-07-08 NOTE — ED Notes (Signed)
ED Provider at bedside. 

## 2016-07-08 NOTE — ED Notes (Signed)
Patient is more calm and cooperative at this time.  Not crying or complaining of pain.

## 2016-07-08 NOTE — ED Provider Notes (Signed)
[facility] Emergency Department Provider Note  ____________________________________________   First MD Initiated Contact with Patient 07/08/16 1415     (approximate)  I have reviewed the triage vital signs and the nursing notes.   HISTORY  Chief Complaint Altered Mental Status and Hallucinations  History obtained primarily from the patient's daughter as the patient is quite confused   HPI Melissa Cochran is a 81 y.o. female who is brought to the emergency department by her daughter for 6 days of increasing altered mental status. According to the daughter the patient is now hallucinating and speaking to her long dead mother. She's had no fevers or chills. She does have a chronic cough but no change. No vomiting. She denies trauma. They came to the vein clinic today to have the patient's benefits changed and as there were artery here at the hospital they decided to check into the emergency department for evaluation.   Past Medical History:  Diagnosis Date  . Adnexal mass   . Anxiety   . Arthritis   . Bladder tumor   . Bleeding ulcer   . CHF (congestive heart failure) ([place])   . CHF (congestive heart failure) ([place])   . COPD (chronic obstructive pulmonary disease) ([place])   . Coronary artery disease   . GERD (gastroesophageal reflux disease)   . Heart block    left  . History of bleeding ulcers   . Hyperlipemia   . Hypertension   . Pneumonia   . Shortness of breath dyspnea     Patient Active Problem List   Diagnosis Date Noted  . Venous ulcer of ankle ([place]) 07/08/2016  . DVT (deep venous thrombosis) ([place]) 07/02/2016  . Swelling of limb 06/08/2016  . Pain in limb 06/08/2016  . Altered mental status 01/22/2016  . S/P coronary artery stent placement 10/30/2015  . Atherosclerotic heart disease of native coronary artery without angina pectoris 10/21/2015  . Preop cardiovascular exam 10/08/2015  . SOB (shortness of breath) on exertion 10/08/2015   . Malignant neoplasm of trigone of bladder ([place]) 09/29/2015  . Mass of lower lobe of left lung, incidetnal on CT 09/2015, smoker.  09/29/2015  . Other disorders of lung 09/29/2015  . Other microscopic hematuria 08/26/2015  . Kidney stone 08/26/2015  . Urge incontinence 08/26/2015  . Pneumonia 01/24/2015  . PNA (pneumonia) 01/24/2015  . Venous insufficiency of both lower extremities 12/09/2014  . Neuropathy ([place]) 12/09/2014  . Essential (primary) hypertension 12/09/2014  . Polyneuropathy ([place]) 12/09/2014  . Chronic venous insufficiency 12/09/2014  . Adnexal mass 11/28/2013  . History of cardiac catheterization 11/28/2013  . BP (high blood pressure) 11/28/2013  . Hyperlipidemia 11/28/2013  . Other specified postprocedural states 11/28/2013    Past Surgical History:  Procedure Laterality Date  . ABDOMINAL HYSTERECTOMY    . CARDIAC CATHETERIZATION Left 10/21/2015   Procedure: Left Heart Cath and Coronary Angiography;  Surgeon: Isaias Cowman, MD;  Location: [place] [facility];  Service: Cardiovascular;  Laterality: Left;  . CARDIAC CATHETERIZATION N/A 10/21/2015   Procedure: Coronary Stent Intervention;  Surgeon: Isaias Cowman, MD;  Location: [place] [facility];  Service: Cardiovascular;  Laterality: N/A;  . CARDIAC SURGERY     cardiac cath  . CYSTOSCOPY W/ RETROGRADES Bilateral 01/20/2016   Procedure: CYSTOSCOPY WITH RETROGRADE PYELOGRAM;  Surgeon: Hollice Espy, MD;  Location: [facility];  Service: Urology;  Laterality: Bilateral;  . CYSTOSCOPY WITH STENT PLACEMENT Right 01/20/2016   Procedure: CYSTOSCOPY WITH STENT PLACEMENT;  Surgeon: Hollice Espy,  MD;  Location: [facility];  Service: Urology;  Laterality: Right;  . HIP SURGERY     left  . STOMACH SURGERY    . TRANSURETHRAL RESECTION OF BLADDER TUMOR N/A 01/20/2016   Procedure: TRANSURETHRAL RESECTION OF BLADDER TUMOR (TURBT);  Surgeon: Hollice Espy, MD;  Location: [facility];  Service: Urology;  Laterality: N/A;     Prior to Admission medications   Medication Sig Start Date End Date Taking? Authorizing Provider  acetaminophen (TYLENOL) 325 MG tablet Take 650 mg by mouth every 6 (six) hours as needed for mild pain or headache.    Yes Historical Provider, MD  apixaban (ELIQUIS) 5 MG TABS tablet Take 5 mg by mouth 2 (two) times daily.   Yes Historical Provider, MD  metoprolol (LOPRESSOR) 100 MG tablet Take 1 tablet (100 mg total) by mouth 2 (two) times daily. 10/31/15  Yes [name], MD  traMADol (ULTRAM) 50 MG tablet Take 100 mg by mouth every 6 (six) hours as needed.   Yes Historical Provider, MD  albuterol (PROVENTIL HFA;VENTOLIN HFA) 108 (90 BASE) MCG/ACT inhaler Inhale 2 puffs into the lungs every 6 (six) hours as needed for wheezing or shortness of breath. Patient not taking: Reported on 07/08/2016 01/28/15   Aldean Jewett, MD  amLODipine (NORVASC) 10 MG tablet Take 1 tablet (10 mg total) by mouth daily. Patient taking differently: Take 5 mg by mouth daily.  01/23/16   Loletha Grayer, MD  aspirin 81 MG chewable tablet Chew 1 tablet (81 mg total) by mouth daily. 10/22/15   Isaias Cowman, MD  clopidogrel (PLAVIX) 75 MG tablet Take 1 tablet (75 mg total) by mouth daily. Patient not taking: Reported on 07/08/2016 02/24/15   [name], MD  docusate sodium (COLACE) 100 MG capsule Take 1 capsule (100 mg total) by mouth 2 (two) times daily. 01/20/16   Hollice Espy, MD  sulfamethoxazole-trimethoprim (BACTRIM DS,SEPTRA DS) 800-160 MG tablet Take 1 tablet by mouth 2 (two) times daily. Patient not taking: Reported on 07/08/2016 06/02/16   [name], MD    Allergies Patient has no known allergies.  Family History  Problem Relation Age of Onset  . Stomach cancer Mother   . CVA Father   . Bladder Cancer Neg Hx   . Kidney cancer Neg Hx     Social History Social History  Substance Use Topics  . Smoking status: Former Smoker    Packs/day: 1.00    Years: 40.00    Quit date: 06/13/1998  .  Smokeless tobacco: Never Used  . Alcohol use No    Review of Systems Level V examination further history limited by the patient's altered mental status 10-point ROS otherwise negative.  ____________________________________________   PHYSICAL EXAM:  VITAL SIGNS: ED Triage Vitals  Enc Vitals Group     BP 07/08/16 1356 (!) 175/108     Pulse Rate 07/08/16 1356 (!) 118     Resp 07/08/16 1356 (!) 26     Temp 07/08/16 1356 99 F (37.2 C)     Temp Source 07/08/16 1356 Oral     SpO2 07/08/16 1356 93 %     Weight 07/08/16 1357 155 lb (70.3 kg)     Height 07/08/16 1357 5\' 2"  (1.575 m)     Head Circumference --      Peak Flow --      Pain Score 07/08/16 1357 0     Pain Loc --      Pain Edu? --  Excl. in GC? --     Constitutional: Elevated respiratory rate but no respiratory distress no diaphoresis somewhat agitated speaks in full, clear sentences Eyes: PERRL EOMI. he pulls midrange Head: Atraumatic. Nose: No congestion/rhinnorhea. Mouth/Throat: No trismus Neck: No stridor.   Cardiovascular: Tachycardic rate, regular rhythm. Grossly normal heart sounds.  Good peripheral circulation. Respiratory: Normal respiratory effort.  No retractions. Lungs CTAB and moving good air Gastrointestinal: Soft nondistended nontender no rebound no guarding no peritonitis no McBurney's tenderness negative Rovsing's no costovertebral tenderness negative Murphy's Musculoskeletal: No lower extremity edema   Neurologic: No gross focal neurologic deficits are appreciated. Skin:  Skin is warm, dry and intact. No rash noted.   ____________________________________________   LABS (all labs ordered are listed, but only abnormal results are displayed)  Labs Reviewed  COMPREHENSIVE METABOLIC PANEL - Abnormal; Notable for the following:       Result Value   Sodium 133 (*)    Chloride 98 (*)    Glucose, Bld 109 (*)    AST 45 (*)    Total Bilirubin 1.3 (*)    All other components within normal limits    TROPONIN I - Abnormal; Notable for the following:    Troponin I 0.04 (*)    All other components within normal limits  CBC WITH DIFFERENTIAL/PLATELET - Abnormal; Notable for the following:    RDW 14.9 (*)    All other components within normal limits  PROTIME-INR - Abnormal; Notable for the following:    Prothrombin Time 15.9 (*)    All other components within normal limits  URINALYSIS, ROUTINE W REFLEX MICROSCOPIC - Abnormal; Notable for the following:    Color, Urine YELLOW (*)    APPearance CLEAR (*)    Hgb urine dipstick LARGE (*)    Ketones, ur 20 (*)    Protein, ur 30 (*)    Squamous Epithelial / LPF 0-5 (*)    All other components within normal limits  CULTURE, BLOOD (ROUTINE X 2)  CULTURE, BLOOD (ROUTINE X 2)  URINE CULTURE  LACTIC ACID, PLASMA  LIPASE, BLOOD  PROCALCITONIN   ____________________________________________  EKG  ED ECG REPORT I, Darel Hong, the attending physician, personally viewed and interpreted this ECG. Date: 07/08/2016 EKG Time:  Sinus tachycardia to 120 with frequent premature ventricular contractions no ST elevation normal axis no blocks abnormal EKG  RADIOLOGY  Chest x-ray with no acute findings, head CT with no acute findings, CT abdomen shows very large left-sided kidney stone ____________________________________________   PROCEDURES  Procedure(s) performed: no  Procedures  Critical Care performed: no  ____________________________________________   INITIAL IMPRESSION / ASSESSMENT AND PLAN / ED COURSE  Pertinent labs & imaging results that were available during my care of the patient were reviewed by me and considered in my medical decision making (see chart for details).  On arrival the patient is tachycardic and 2 Which is concerning for possible sepsis. Most likely source is urine as she is diapered dependent. Lungs are clear. She is currently afebrile. Problem workup pending but 30 cc/kg of fluid along with ceftriaxone,  chest x-ray, and head CT.  ----------------------------------------- 5:26 PM on 07/08/2016 -----------------------------------------  The patient remains altered and tachycardic. Unclear etiology of her tachycardia but at this point she requires inpatient admission. CT scan of her abdomen is positive for a large kidney stone but no evidence of urinary tract infection. I discussed the case with the hospitalist who is graciously agreed to admit the patient to his service.  ____________________________________________   FINAL CLINICAL IMPRESSION(S) / ED DIAGNOSES  Final diagnoses:  Altered mental status, unspecified altered mental status type  Kidney stone      NEW MEDICATIONS STARTED DURING THIS VISIT:  New Prescriptions   No medications on file     Note:  This document was prepared using Dragon voice recognition software and may include unintentional dictation errors.     Darel Hong, MD 07/08/16 [phone]

## 2016-07-08 NOTE — ED Notes (Signed)
Attempted to call report. Was informed primary RN still taking report on other patients and would return call.

## 2016-07-08 NOTE — ED Triage Notes (Signed)
Daughter reports pt with confusion and hallucinations since last week. Pt reports shortness of breath.

## 2016-07-08 NOTE — ED Notes (Signed)
Patient is alert and oriented x 4 at this time.

## 2016-07-08 NOTE — Progress Notes (Signed)
Patient arrived to 2A Room 258. Patient denies pain and all questions answered. Patient oriented to unit and Fall Safety Plan signed by patient's daughter due to patient's mental status. Skin assessment completed with Alisa RN and bilateral lower extremities wrapped with coban from treatment completed prior to patient's arrival; skin otherwise intact. A&O to person and place, VSS, and ST on verified tele-box #[id number]. Nursing staff will continue to monitor for any changes in patient status. Earleen Reaper, RN

## 2016-07-08 NOTE — ED Notes (Signed)
This RN attempted 3 IVs unsucessfully in order to obtain a second blood culture.  MD aware.  MD states we can discontinue 2nd blood culture order at this time.

## 2016-07-08 NOTE — Progress Notes (Signed)
History of Present Illness  There is no documented history at this time  Assessments & Plan   There are no diagnoses linked to this encounter.    Additional instructions  Subjective:  Patient presents with venous ulcer of the Bilateral lower extremity.    Procedure:  3 layer unna wrap was placed Bilateral lower extremity.   Plan:   Follow up in one week.  

## 2016-07-08 NOTE — Plan of Care (Signed)
Problem: Safety: Goal: Ability to remain free from injury will improve Outcome: Progressing Fall precautions in place with bed alarm on and side rails up x2.

## 2016-07-08 NOTE — H&P (Addendum)
[facility] [place] NAME: Melissa Cochran    MR#:  [id number]  DATE OF BIRTH:  Feb 12, 1936  DATE OF ADMISSION:  07/08/2016  PRIMARY CARE PHYSICIAN: Otilio Miu, MD   REQUESTING/REFERRING PHYSICIAN: Darel Hong, MD  CHIEF COMPLAINT:   Chief Complaint  Patient presents with  . Altered Mental Status  . Hallucinations   Confusion and hallucination for several days HISTORY OF PRESENT ILLNESS:  Melissa Cochran  is a 81 y.o. female with a known history of CHF, COPD, CAD, hypertension and bladder tumor. The patient was sent from home to the ED due to confusion and hallucination, which has been worsening. The patient is alert, awake, 3, she denies any symptoms. But according to her daughter, the patient has been confusing for 6 days. She has a chronic cough but no change. She was found to high blood pressure at 193/100, HR 130. Urinalysis show hematuria. Abdominal CAT scan show kidney stone without obstruction.  PAST MEDICAL HISTORY:   Past Medical History:  Diagnosis Date  . Adnexal mass   . Anxiety   . Arthritis   . Bladder tumor   . Bleeding ulcer   . CHF (congestive heart failure) ([place])   . CHF (congestive heart failure) ([place])   . COPD (chronic obstructive pulmonary disease) ([place])   . Coronary artery disease   . GERD (gastroesophageal reflux disease)   . Heart block    left  . History of bleeding ulcers   . Hyperlipemia   . Hypertension   . Pneumonia   . Shortness of breath dyspnea     PAST SURGICAL HISTORY:   Past Surgical History:  Procedure Laterality Date  . ABDOMINAL HYSTERECTOMY    . CARDIAC CATHETERIZATION Left 10/21/2015   Procedure: Left Heart Cath and Coronary Angiography;  Surgeon: Isaias Cowman, MD;  Location: [facility];  Service: Cardiovascular;  Laterality: Left;  . CARDIAC CATHETERIZATION N/A 10/21/2015   Procedure: Coronary Stent Intervention;  Surgeon: Isaias Cowman, MD;   Location: [facility];  Service: Cardiovascular;  Laterality: N/A;  . CARDIAC SURGERY     cardiac cath  . CYSTOSCOPY W/ RETROGRADES Bilateral 01/20/2016   Procedure: CYSTOSCOPY WITH RETROGRADE PYELOGRAM;  Surgeon: Hollice Espy, MD;  Location: [facility];  Service: Urology;  Laterality: Bilateral;  . CYSTOSCOPY WITH STENT PLACEMENT Right 01/20/2016   Procedure: CYSTOSCOPY WITH STENT PLACEMENT;  Surgeon: Hollice Espy, MD;  Location: [facility];  Service: Urology;  Laterality: Right;  . HIP SURGERY     left  . STOMACH SURGERY    . TRANSURETHRAL RESECTION OF BLADDER TUMOR N/A 01/20/2016   Procedure: TRANSURETHRAL RESECTION OF BLADDER TUMOR (TURBT);  Surgeon: Hollice Espy, MD;  Location: [facility];  Service: Urology;  Laterality: N/A;    SOCIAL HISTORY:   Social History  Substance Use Topics  . Smoking status: Former Smoker    Packs/day: 1.00    Years: 40.00    Quit date: 06/13/1998  . Smokeless tobacco: Never Used  . Alcohol use No    FAMILY HISTORY:   Family History  Problem Relation Age of Onset  . Stomach cancer Mother   . CVA Father   . Bladder Cancer Neg Hx   . Kidney cancer Neg Hx     DRUG ALLERGIES:  No Known Allergies  REVIEW OF SYSTEMS:   Review of Systems  Constitutional: Negative for chills, fever and malaise/fatigue.  HENT: Negative for congestion.   Eyes: Negative for blurred  vision and double vision.  Respiratory: Negative for cough, shortness of breath and stridor.   Cardiovascular: Negative for chest pain and leg swelling.  Gastrointestinal: Negative for abdominal pain, blood in stool, constipation, diarrhea, melena, nausea and vomiting.  Genitourinary: Negative for dysuria and hematuria.  Musculoskeletal: Negative for back pain.  Neurological: Negative for dizziness, speech change, focal weakness, seizures, loss of consciousness, weakness and headaches.    MEDICATIONS AT HOME:   Prior to Admission medications   Medication Sig Start Date End  Date Taking? Authorizing Provider  acetaminophen (TYLENOL) 325 MG tablet Take 650 mg by mouth every 6 (six) hours as needed for mild pain or headache.    Yes Historical Provider, MD  apixaban (ELIQUIS) 5 MG TABS tablet Take 5 mg by mouth 2 (two) times daily.   Yes Historical Provider, MD  metoprolol (LOPRESSOR) 100 MG tablet Take 1 tablet (100 mg total) by mouth 2 (two) times daily. 10/31/15  Yes [name], MD  traMADol (ULTRAM) 50 MG tablet Take 100 mg by mouth every 6 (six) hours as needed.   Yes Historical Provider, MD  albuterol (PROVENTIL HFA;VENTOLIN HFA) 108 (90 BASE) MCG/ACT inhaler Inhale 2 puffs into the lungs every 6 (six) hours as needed for wheezing or shortness of breath. Patient not taking: Reported on 07/08/2016 01/28/15   Aldean Jewett, MD  amLODipine (NORVASC) 10 MG tablet Take 1 tablet (10 mg total) by mouth daily. Patient taking differently: Take 5 mg by mouth daily.  01/23/16   Loletha Grayer, MD  aspirin 81 MG chewable tablet Chew 1 tablet (81 mg total) by mouth daily. 10/22/15   Isaias Cowman, MD  clopidogrel (PLAVIX) 75 MG tablet Take 1 tablet (75 mg total) by mouth daily. Patient not taking: Reported on 07/08/2016 02/24/15   [name], MD  docusate sodium (COLACE) 100 MG capsule Take 1 capsule (100 mg total) by mouth 2 (two) times daily. 01/20/16   Hollice Espy, MD  sulfamethoxazole-trimethoprim (BACTRIM DS,SEPTRA DS) 800-160 MG tablet Take 1 tablet by mouth 2 (two) times daily. Patient not taking: Reported on 07/08/2016 06/02/16   [name], MD      VITAL SIGNS:  Blood pressure (!) 182/96, pulse (!) 130, temperature 98.6 F (37 C), temperature source Axillary, resp. rate (!) 26, height 5\' 2"  (1.575 m), weight 155 lb (70.3 kg), SpO2 97 %.  PHYSICAL EXAMINATION:  Physical Exam  GENERAL:  81 y.o.-year-old patient lying in the bed with no acute distress.  EYES: Pupils equal, round, reactive to light and accommodation. No scleral icterus. Extraocular  muscles intact.  HEENT: Head atraumatic, normocephalic. Oropharynx and nasopharynx clear.  NECK:  Supple, no jugular venous distention. No thyroid enlargement, no tenderness.  LUNGS: Normal breath sounds bilaterally, no wheezing, rales,rhonchi or crepitation. No use of accessory muscles of respiration.  CARDIOVASCULAR: S1, S2 normal. No murmurs, rubs, or gallops.  ABDOMEN: Soft, nontender, nondistended. Bowel sounds present. No organomegaly or mass.  EXTREMITIES: Bilateral leg in Unna boots. NEUROLOGIC: Cranial nerves II through XII are intact. Muscle strength 5/5 in all extremities. Sensation intact. Gait not checked.  PSYCHIATRIC: The patient is alert and oriented x 3.  SKIN: No obvious rash, lesion, or ulcer.   LABORATORY PANEL:   CBC  Recent Labs Lab 07/08/16 1449  WBC 8.2  HGB 13.2  HCT 39.0  PLT 281   ------------------------------------------------------------------------------------------------------------------  Chemistries   Recent Labs Lab 07/08/16 1449  NA 133*  K 4.3  CL 98*  CO2 27  GLUCOSE 109*  BUN 18  CREATININE 0.59  CALCIUM 9.5  AST 45*  ALT 17  ALKPHOS 116  BILITOT 1.3*   ------------------------------------------------------------------------------------------------------------------  Cardiac Enzymes  Recent Labs Lab 07/08/16 1449  TROPONINI 0.04*   ------------------------------------------------------------------------------------------------------------------  RADIOLOGY:  Ct Head Wo Contrast  Result Date: 07/08/2016 CLINICAL DATA:  Confusion and hallucinations since last week. EXAM: CT HEAD WITHOUT CONTRAST TECHNIQUE: Contiguous axial images were obtained from the base of the skull through the vertex without intravenous contrast. COMPARISON:  01/12/2013 FINDINGS: Brain: Stable age related cerebral atrophy, ventriculomegaly and periventricular white matter disease. No extra-axial fluid collections are identified. No CT findings for acute  hemispheric infarction or intracranial hemorrhage. Stable 2 cm hyperdense extra-axial lesion in the right parietal area consistent with a hemangioma. The brainstem and cerebellum are normal. Vascular: No hyperdense vessels were definite aneurysm. Stable vascular calcifications. Skull: No skull fracture or bone lesion. Sinuses/Orbits: The paranasal sinuses and mastoid air cells are clear except for right mastoid effusions. The globes are intact. Other: No scalp lesions or hematoma. IMPRESSION: 1. Stable 2 cm right parietal meningioma. 2. Chronic changes but no acute findings. Electronically Signed   By: Marijo Sanes M.D.   On: 07/08/2016 16:46   Dg Chest Port 1 View  Result Date: 07/08/2016 CLINICAL DATA:  New onset altered mental status, tachycardia. Former smoker. EXAM: PORTABLE CHEST 1 VIEW COMPARISON:  Chest x-ray of January 21, 2016 FINDINGS: The lungs are well-expanded and clear. The heart and pulmonary vascularity are normal. There is calcification in the mitral valvular annulus. The mediastinum is normal in width. There is calcification in the wall of the aortic arch. There is gentle dextrocurvature centered in the mid thoracic spine. There surgical clips in the region of the GE junction. IMPRESSION: Mild chronic bronchitic changes. No pneumonia, CHF, nor other acute cardiopulmonary abnormality. Thoracic aortic atherosclerosis. Electronically Signed   By: David  Martinique M.D.   On: 07/08/2016 14:46   Ct Renal Stone Study  Result Date: 07/08/2016 CLINICAL DATA:  Hematuria, confusion, shortness of breath EXAM: CT ABDOMEN AND PELVIS WITHOUT CONTRAST TECHNIQUE: Multidetector CT imaging of the abdomen and pelvis was performed following the standard protocol without IV contrast. COMPARISON:  CT abdomen pelvis of 09/16/2015, and CT abdomen pelvis of 10/22/2012 FINDINGS: Lower chest: The lung bases are clear. The heart is mildly enlarged. Mitral annular calcification is present. No pericardial effusion is  noted. Hepatobiliary: There are few scattered low-attenuation lesions within the liver of uncertain significance. In the patient's arms by adjacent to the body creating artifacts making assessment more difficult. No hepatic dilatation is seen. No calcified gallstones are noted. Pancreas: The pancreas is normal in size. The pancreatic duct is not dilated. Spleen: The spleen is unremarkable. Adrenals/Urinary Tract: The adrenal glands appear normal. No right renal calculi are seen. However there are left renal calculi present the largest of which is in the lower pole measured 11 x 8 mm. No present obstruction on the left is seen. The ureters appear normal in caliber. Unfortunately much of the anatomy of the bladder is obscured by artifacts created by the left hip replacement. Stomach/Bowel: The stomach is not well distended but no abnormality is seen. Some sutures are present distally probably due to partial gastrectomy as noted previously. No small bowel distention is evident. There scattered rectosigmoid colon diverticula present. The terminal ileum is unremarkable. The appendix is not definitely visualized. Vascular/Lymphatic: The abdominal aorta is normal caliber with significant abdominal aortic atherosclerosis present. No adenopathy is seen. Reproductive:  The uterus has previously been resected. Cystic adnexal structures again are noted left larger than right however, compared to the CT the abdomen pelvis from 10/22/2012, these lesions are actually smaller and therefore consistent with a benign process. No free fluid is seen. Other: None. Musculoskeletal: The lumbar vertebrae are in normal alignment with diffuse degenerative change. Left hip replacement is noted. The SI joints are corticated. IMPRESSION: 1. Left renal calculi, the largest of 11 right 8 mm. No present obstruction. 2. Arch much of the urinary bladder is obscured by artifacts created by the left hip replacement, but the previously noted bladder  lesion is not definitely seen. 3. Cystic adnexal lesions left larger than right have decreased since the CT of 2014, and therefore are consistent with a benign process. 4. Abdominal aortic atherosclerosis. Electronically Signed   By: Ivar Drape M.D.   On: 07/08/2016 16:56      IMPRESSION AND PLAN:   Malignant hypertension and tachycardia  The patient will be admitted to medical floor. Telemetry monitor, start Lopressor IV every 4 hours prn, continue Lopressor and Norvasc.  Acute  encephalopathy. Unclear ideology, possible related to malignant hypertension. Aspiration and fall precaution.  Hyponatremia. Start normal saline IV and follow-up BMP.  Elevated troponin. Due to demanding ischemia due to medication hypertension. Follow-up troponin, cardiology consult.  Right leg DVT. Diagnosed last week, continue Eliquis. Vascular surgery consult. Since the patient has tachycardia with recent diagnosis DVT, I will check CT chest angiogram to rule out PE.  Nephrolithiasis with hematuria. Urology consult. Our on call urologist, since patient is symptomatic, and no obstruction, follow-up Dr. Franki Cabot as outpatient.  All the records are reviewed and case discussed with ED provider. Management plans discussed with the patient, Her daughter and they are in agreement.  CODE STATUS: Full code  TOTAL TIME TAKING CARE OF THIS PATIENT: 55 minutes.    Demetrios Loll M.D on 07/08/2016 at 7:14 PM  Between 7am to 6pm - Pager - [phone]  After 6pm go to [email or web address] - Proofreader  [facility]  Office  [phone]  CC: Primary care physician; Otilio Miu, MD   Note: This dictation was prepared with Dragon dictation along with smaller phrase technology. Any transcriptional errors that result from this process are unintentional.

## 2016-07-09 ENCOUNTER — Other Ambulatory Visit: Payer: Self-pay

## 2016-07-09 DIAGNOSIS — N200 Calculus of kidney: Secondary | ICD-10-CM

## 2016-07-09 DIAGNOSIS — C670 Malignant neoplasm of trigone of bladder: Secondary | ICD-10-CM

## 2016-07-09 DIAGNOSIS — R311 Benign essential microscopic hematuria: Secondary | ICD-10-CM

## 2016-07-09 DIAGNOSIS — R4182 Altered mental status, unspecified: Secondary | ICD-10-CM

## 2016-07-09 DIAGNOSIS — I10 Essential (primary) hypertension: Secondary | ICD-10-CM

## 2016-07-09 DIAGNOSIS — R05 Cough: Secondary | ICD-10-CM

## 2016-07-09 LAB — BASIC METABOLIC PANEL
Anion gap: 7 (ref 5–15)
BUN: 11 mg/dL (ref 6–20)
CALCIUM: 9 mg/dL (ref 8.9–10.3)
CHLORIDE: 103 mmol/L (ref 101–111)
CO2: 26 mmol/L (ref 22–32)
CREATININE: 0.43 mg/dL — AB (ref 0.44–1.00)
Glucose, Bld: 98 mg/dL (ref 65–99)
Potassium: 4.1 mmol/L (ref 3.5–5.1)
SODIUM: 136 mmol/L (ref 135–145)

## 2016-07-09 LAB — CBC
HCT: 37.1 % (ref 35.0–47.0)
Hemoglobin: 12 g/dL (ref 12.0–16.0)
MCH: 28.9 pg (ref 26.0–34.0)
MCHC: 32.4 g/dL (ref 32.0–36.0)
MCV: 89.3 fL (ref 80.0–100.0)
PLATELETS: 238 10*3/uL (ref 150–440)
RBC: 4.16 MIL/uL (ref 3.80–5.20)
RDW: 15 % — AB (ref 11.5–14.5)
WBC: 7 10*3/uL (ref 3.6–11.0)

## 2016-07-09 NOTE — Plan of Care (Signed)
Problem: Activity: Goal: Risk for activity intolerance will decrease Outcome: Progressing Patient assisted to the bedside commode with minimal assistance, tolerated well. Physical therapy due to work with patient. Will continue to encourage mobility.

## 2016-07-09 NOTE — Evaluation (Signed)
Physical Therapy Evaluation Patient Details Name: Melissa Cochran MRN: [id number] DOB: 04-Nov-1935 Today's Date: 07/09/2016   History of Present Illness  Pt is a 81 y.o. female presenting to hospital with AMS and hallucinations.  Pt admitted with malignant htn, tachycardia, acute encephalopathy, hyponatremia, and microscopic hematuria.  Pt with elevated troponin (per cardiology note, likely demand supply ischemia).  B unna boots placed 07/08/16.  PMH includes bladder CA, h/o 2 cm R parietal meningioma, recent h/o R LE DVT, CHF, COPD, CAD, htn, L hip sx, s/p TURBT.  Clinical Impression  Prior to hospital admission, pt's granddaughter reports pt was ambulating independently.  Pt lives with her daughter and granddaughter in 1 level home with steps to enter.  Pt sleeping upon PT arrival and once woken up pt was talking some and became alert once assisted into sitting edge of bed.  Once sitting (SBA static sitting), pt joking around and talking with therapist but resisted efforts to stand (refused to stand, get to chair, walk) and then refused to lay back down (eventually required 2 assist to lay back down d/t pt declining to do anything but sit on edge of bed even though pt was talking about wanting to go back to bed and sleep).  Pt appearing confused during session.  Very difficult to obtain a good functional mobility assessment d/t pt's difficulty participating in session.  Will continue to assess pt's progress during hospital stay and any changes to disposition recommendations.    Follow Up Recommendations SNF    Equipment Recommendations  Rolling walker with 5" wheels    Recommendations for Other Services       Precautions / Restrictions Precautions Precautions: Fall Precaution Comments: Aspiration Restrictions Weight Bearing Restrictions: No      Mobility  Bed Mobility Overal bed mobility: Needs Assistance Bed Mobility: Supine to Sit;Sit to Supine     Supine to sit: Max assist;+2  for physical assistance Sit to supine: Max assist;+2 for physical assistance   General bed mobility comments: Pt sat up on edge of bed with 2 assist and became more alert.  Eventually required 2 assist to lay back down d/t pt refusing to stand/do any mobility and also refusing to assist with laying back down.  Transfers Overall transfer level: Needs assistance Equipment used: Rolling walker (2 wheeled)             General transfer comment: Attempted to stand with pt 3x's but pt leaning back each time (even with max cueing) appearing to resist attempts at standing.  Ambulation/Gait             General Gait Details: Not able to assess at this time.  Stairs            Wheelchair Mobility    Modified Rankin (Stroke Patients Only)       Balance Overall balance assessment: Needs assistance Sitting-balance support: No upper extremity supported;Feet supported Sitting balance-Leahy Scale: Fair Sitting balance - Comments: static sitting edge of bed                                     Pertinent Vitals/Pain Pain Assessment: Faces Faces Pain Scale: No hurt  Vitals (HR and O2 on room air) stable and WFL throughout treatment session.    Home Living Family/patient expects to be discharged to:: Private residence Living Arrangements: Children (Pt's daughter and granddaughter) Available Help at Discharge: Family;Available PRN/intermittently Type of  Home: House Home Access: Stairs to enter Entrance Stairs-Rails: None Entrance Stairs-Number of Steps: 3 (holds onto door) Home Layout: One level Home Equipment: Environmental consultant - 2 wheels;Cane - single point;Bedside commode (per prior PT eval)      Prior Function Level of Independence: Independent         Comments: Independent with ADLs/IADLs. No recent h/o falls.     Hand Dominance        Extremity/Trunk Assessment   Upper Extremity Assessment Upper Extremity Assessment: Difficult to assess due to  impaired cognition    Lower Extremity Assessment Lower Extremity Assessment: Difficult to assess due to impaired cognition       Communication   Communication: No difficulties  Cognition Arousal/Alertness: Lethargic Behavior During Therapy: Impulsive Overall Cognitive Status: Difficult to assess                      General Comments General comments (skin integrity, edema, etc.): Pt's granddaughter present beginning and end of session (not present during middle of session).  Nursing cleared pt for participation in physical therapy.    Exercises     Assessment/Plan    PT Assessment Patient needs continued PT services  PT Problem List Decreased mobility;Decreased cognition       PT Treatment Interventions DME instruction;Gait training;Stair training;Functional mobility training;Therapeutic activities;Therapeutic exercise;Balance training;Patient/family education    PT Goals (Current goals can be found in the Care Plan section)  Acute Rehab PT Goals Patient Stated Goal: to sleep more PT Goal Formulation: With patient Time For Goal Achievement: 07/23/16 Potential to Achieve Goals: Good    Frequency Min 2X/week   Barriers to discharge Other (comment) Question level of support    Co-evaluation               End of Session Equipment Utilized During Treatment: Gait belt Activity Tolerance: Patient limited by lethargy Patient left: in bed;with call bell/phone within reach;with bed alarm set;with family/visitor present Nurse Communication: Mobility status;Precautions PT Visit Diagnosis: Difficulty in walking, not elsewhere classified (R26.2)         Time: 1450-1510 PT Time Calculation (min) (ACUTE ONLY): 20 min   Charges:   PT Evaluation $PT Eval Low Complexity: 1 Procedure     PT G CodesLeitha Bleak, PT 07/09/16, 3:39 PM [phone]

## 2016-07-09 NOTE — Progress Notes (Signed)
[facility] Cardiology  SUBJECTIVE: I don't have chest pain   Vitals:   07/08/16 1800 07/08/16 1913 07/08/16 1943 07/08/16 2011  BP: (!) 193/100 (!) 182/96 (!) 149/89 (!) 156/59  Pulse: (!) 117 (!) 130 (!) 110 (!) 134  Resp: (!) 31 (!) 26 (!) 25 (!) 36  Temp:  98.6 F (37 C)  99.6 F (37.6 C)  TempSrc:  Axillary  Oral  SpO2: 97% 97% 97% 94%  Weight:    73.5 kg (162 lb)  Height:         Intake/Output Summary (Last 24 hours) at 07/09/16 [id number] Last data filed at 07/09/16 [id number]  Gross per 24 hour  Intake              785 ml  Output                0 ml  Net              785 ml      PHYSICAL EXAM  General: Well developed, well nourished, in no acute distress HEENT:  Normocephalic and atramatic Neck:  No JVD.  Lungs: Clear bilaterally to auscultation and percussion. Heart: HRRR . Normal S1 and S2 without gallops or murmurs.  Abdomen: Bowel sounds are positive, abdomen soft and non-tender  Msk:  Back normal, normal gait. Normal strength and tone for age. Extremities: No clubbing, cyanosis or edema.   Neuro: Alert and oriented X 3. Psych:  Good affect, responds appropriately   LABS: Basic Metabolic Panel:  Recent Labs  07/08/16 1449 07/08/16 2037 07/09/16 0640  NA 133*  --  136  K 4.3  --  4.1  CL 98*  --  103  CO2 27  --  26  GLUCOSE 109*  --  98  BUN 18  --  11  CREATININE 0.59  --  0.43*  CALCIUM 9.5  --  9.0  MG  --  1.8  --    Liver Function Tests:  Recent Labs  07/08/16 1449  AST 45*  ALT 17  ALKPHOS 116  BILITOT 1.3*  PROT 7.1  ALBUMIN 3.7    Recent Labs  07/08/16 1449  LIPASE 18   CBC:  Recent Labs  07/08/16 1449 07/09/16 0640  WBC 8.2 7.0  NEUTROABS 6.3  --   HGB 13.2 12.0  HCT 39.0 37.1  MCV 87.5 89.3  PLT 281 238   Cardiac Enzymes:  Recent Labs  07/08/16 1449 07/08/16 2151  TROPONINI 0.04* 0.08*   BNP: Invalid input(s): POCBNP D-Dimer: No results for input(s): DDIMER in the last 72 hours. Hemoglobin A1C: No results for  input(s): HGBA1C in the last 72 hours. Fasting Lipid Panel: No results for input(s): CHOL, HDL, LDLCALC, TRIG, CHOLHDL, LDLDIRECT in the last 72 hours. Thyroid Function Tests: No results for input(s): TSH, T4TOTAL, T3FREE, THYROIDAB in the last 72 hours.  Invalid input(s): FREET3 Anemia Panel: No results for input(s): VITAMINB12, FOLATE, FERRITIN, TIBC, IRON, RETICCTPCT in the last 72 hours.  Ct Head Wo Contrast  Result Date: 07/08/2016 CLINICAL DATA:  Confusion and hallucinations since last week. EXAM: CT HEAD WITHOUT CONTRAST TECHNIQUE: Contiguous axial images were obtained from the base of the skull through the vertex without intravenous contrast. COMPARISON:  01/12/2013 FINDINGS: Brain: Stable age related cerebral atrophy, ventriculomegaly and periventricular white matter disease. No extra-axial fluid collections are identified. No CT findings for acute hemispheric infarction or intracranial hemorrhage. Stable 2 cm hyperdense extra-axial lesion in the right parietal area consistent with  a hemangioma. The brainstem and cerebellum are normal. Vascular: No hyperdense vessels were definite aneurysm. Stable vascular calcifications. Skull: No skull fracture or bone lesion. Sinuses/Orbits: The paranasal sinuses and mastoid air cells are clear except for right mastoid effusions. The globes are intact. Other: No scalp lesions or hematoma. IMPRESSION: 1. Stable 2 cm right parietal meningioma. 2. Chronic changes but no acute findings. Electronically Signed   By: Marijo Sanes M.D.   On: 07/08/2016 16:46   Ct Angio Chest Pe W Or Wo Contrast  Result Date: 07/08/2016 CLINICAL DATA:  Worsening confusion and tachycardia. EXAM: CT ANGIOGRAPHY CHEST WITH CONTRAST TECHNIQUE: Multidetector CT imaging of the chest was performed using the standard protocol during bolus administration of intravenous contrast. Multiplanar CT image reconstructions and MIPs were obtained to evaluate the vascular anatomy. CONTRAST:  75 cc  Isovue 370 COMPARISON:  None. FINDINGS: Cardiovascular: Heart size upper normal. Coronary artery calcification is noted. No evidence of pericardial effusion. Atherosclerotic calcification is noted in the wall of the thoracic aorta. No evidence for filling defect within the opacified pulmonary arteries to suggest the presence of an acute pulmonary embolus. Mediastinum/Nodes: No mediastinal lymphadenopathy. There is no hilar lymphadenopathy. The esophagus has normal imaging features. 2.0 cm left thyroid nodule evident. There is no axillary lymphadenopathy. Lungs/Pleura: Fine detail of lung parenchyma obscured by respiratory motion. Biapical pleural-parenchymal scarring is evident. Subsegmental atelectasis or linear scarring noted right middle lobe. Centrilobular emphysema noted. Bronchial wall thickening is evident. No focal airspace consolidation. No pulmonary edema or pleural effusion. Upper Abdomen: The liver shows diffusely decreased attenuation suggesting steatosis. Surgical clips are noted in the region of the esophagogastric junction with suture line visible in the stomach, incompletely visualized. Musculoskeletal: Bone windows reveal no worrisome lytic or sclerotic osseous lesions. Review of the MIP images confirms the above findings. IMPRESSION: 1. No CT evidence for acute pulmonary embolus. 2.  Emphysema. (ICD10-J43.9) 3. Coronary artery and thoracic aortic atherosclerosis. 4. 2.0 cm left thyroid nodule. Thyroid ultrasound could be used to further evaluate, as clinically warranted. Electronically Signed   By: Misty Stanley M.D.   On: 07/08/2016 20:38   Dg Chest Port 1 View  Result Date: 07/08/2016 CLINICAL DATA:  New onset altered mental status, tachycardia. Former smoker. EXAM: PORTABLE CHEST 1 VIEW COMPARISON:  Chest x-ray of January 21, 2016 FINDINGS: The lungs are well-expanded and clear. The heart and pulmonary vascularity are normal. There is calcification in the mitral valvular annulus. The  mediastinum is normal in width. There is calcification in the wall of the aortic arch. There is gentle dextrocurvature centered in the mid thoracic spine. There surgical clips in the region of the GE junction. IMPRESSION: Mild chronic bronchitic changes. No pneumonia, CHF, nor other acute cardiopulmonary abnormality. Thoracic aortic atherosclerosis. Electronically Signed   By: David  Martinique M.D.   On: 07/08/2016 14:46   Ct Renal Stone Study  Result Date: 07/08/2016 CLINICAL DATA:  Hematuria, confusion, shortness of breath EXAM: CT ABDOMEN AND PELVIS WITHOUT CONTRAST TECHNIQUE: Multidetector CT imaging of the abdomen and pelvis was performed following the standard protocol without IV contrast. COMPARISON:  CT abdomen pelvis of 09/16/2015, and CT abdomen pelvis of 10/22/2012 FINDINGS: Lower chest: The lung bases are clear. The heart is mildly enlarged. Mitral annular calcification is present. No pericardial effusion is noted. Hepatobiliary: There are few scattered low-attenuation lesions within the liver of uncertain significance. In the patient's arms by adjacent to the body creating artifacts making assessment more difficult. No hepatic dilatation is seen. No  calcified gallstones are noted. Pancreas: The pancreas is normal in size. The pancreatic duct is not dilated. Spleen: The spleen is unremarkable. Adrenals/Urinary Tract: The adrenal glands appear normal. No right renal calculi are seen. However there are left renal calculi present the largest of which is in the lower pole measured 11 x 8 mm. No present obstruction on the left is seen. The ureters appear normal in caliber. Unfortunately much of the anatomy of the bladder is obscured by artifacts created by the left hip replacement. Stomach/Bowel: The stomach is not well distended but no abnormality is seen. Some sutures are present distally probably due to partial gastrectomy as noted previously. No small bowel distention is evident. There scattered  rectosigmoid colon diverticula present. The terminal ileum is unremarkable. The appendix is not definitely visualized. Vascular/Lymphatic: The abdominal aorta is normal caliber with significant abdominal aortic atherosclerosis present. No adenopathy is seen. Reproductive: The uterus has previously been resected. Cystic adnexal structures again are noted left larger than right however, compared to the CT the abdomen pelvis from 10/22/2012, these lesions are actually smaller and therefore consistent with a benign process. No free fluid is seen. Other: None. Musculoskeletal: The lumbar vertebrae are in normal alignment with diffuse degenerative change. Left hip replacement is noted. The SI joints are corticated. IMPRESSION: 1. Left renal calculi, the largest of 11 right 8 mm. No present obstruction. 2. Arch much of the urinary bladder is obscured by artifacts created by the left hip replacement, but the previously noted bladder lesion is not definitely seen. 3. Cystic adnexal lesions left larger than right have decreased since the CT of 2014, and therefore are consistent with a benign process. 4. Abdominal aortic atherosclerosis. Electronically Signed   By: Ivar Drape M.D.   On: 07/08/2016 16:56     Echo   TELEMETRY: Sinus rhythm:  ASSESSMENT AND PLAN:  Active Problems:   HTN (hypertension), malignant    1. Borderline elevated troponin, in the setting of markedly elevated blood pressure, in the absence of chest pain, with nondiagnostic ECG, likely demand supply ischemia 2. Altered mental status, with confusion and hallucinations 3. Malignant hypertension, blood pressure much better today 4. Right lower extremity DVT  Recommendations  1. Continue current medical therapy 2. Continue Eliquis 3. Defer full dose anticoagulation 4. Continue to cycle cardiac enzymes 5. Further recommendations pending patient's initial clinical course   Isaias Cowman, MD, PhD, [facility] 07/09/2016 9:06  AM

## 2016-07-09 NOTE — Consult Note (Signed)
10:59 AM   Melissa Cochran 1935/08/19 [id number]  Referring provider: Dr. Demetrios Loll  Chief Complaint  Patient presents with  . Altered Mental Status  . Hallucinations    HPI: The patient is a 81 year old female with a past medical history significant for bladder cancer and nephrolithiasis who presented to the hospital with altered mental status. The patient is unable to provide a history at this time, but her granddaughter said that she came in with acute deconditioning of her mental status and is slowly improving. Urology was consulted for microscopic hematuria and nephrolithiasis. She currently notes that her urine is clear yellow which is consistent with the urinalysis results. Though there was microscopic hematuria seen on the specimen, her urinalysis did not suggest infection.  1 - Bladder Cancer - Rt trigone approx 5cm papillary mass by CT and cysto 09/2015 on eval hematuria and irritative voiding. No hydro. No pelvic adenopathy or loss of perivesical fat planes.  Now s/p TURBT for 5 cm tumor (delayed due to need for cardiac stent), right stent ureteral stent placement on 01/20/16.  Pathology c/w HgTa diease, large tumor burden.  Muscularis propria and lamina propria are identified. Evaluation limited by cautery artifact.  Patient did not show for BCG treatments. She is overdue for surveillance cystoscopy.  2 - Nephrolithiasis - h/o medically passed stones previously. CT 09/2015 with 59mm LLP w/o hydro.  Stable on repeat imaging this admission   PMH: Past Medical History:  Diagnosis Date  . Adnexal mass   . Anxiety   . Arthritis   . Bladder tumor   . Bleeding ulcer   . CHF (congestive heart failure) ([place])   . CHF (congestive heart failure) ([place])   . COPD (chronic obstructive pulmonary disease) ([place])   . Coronary artery disease   . GERD (gastroesophageal reflux disease)   . Heart block    left  . History of bleeding ulcers   . Hyperlipemia   . Hypertension   .  Pneumonia   . Shortness of breath dyspnea     Surgical History: Past Surgical History:  Procedure Laterality Date  . ABDOMINAL HYSTERECTOMY    . CARDIAC CATHETERIZATION Left 10/21/2015   Procedure: Left Heart Cath and Coronary Angiography;  Surgeon: Isaias Cowman, MD;  Location: [place] [facility];  Service: Cardiovascular;  Laterality: Left;  . CARDIAC CATHETERIZATION N/A 10/21/2015   Procedure: Coronary Stent Intervention;  Surgeon: Isaias Cowman, MD;  Location: [place] [facility];  Service: Cardiovascular;  Laterality: N/A;  . CARDIAC SURGERY     cardiac cath  . CYSTOSCOPY W/ RETROGRADES Bilateral 01/20/2016   Procedure: CYSTOSCOPY WITH RETROGRADE PYELOGRAM;  Surgeon: Hollice Espy, MD;  Location: [facility];  Service: Urology;  Laterality: Bilateral;  . CYSTOSCOPY WITH STENT PLACEMENT Right 01/20/2016   Procedure: CYSTOSCOPY WITH STENT PLACEMENT;  Surgeon: Hollice Espy, MD;  Location: [facility];  Service: Urology;  Laterality: Right;  . HIP SURGERY     left  . STOMACH SURGERY    . TRANSURETHRAL RESECTION OF BLADDER TUMOR N/A 01/20/2016   Procedure: TRANSURETHRAL RESECTION OF BLADDER TUMOR (TURBT);  Surgeon: Hollice Espy, MD;  Location: [facility];  Service: Urology;  Laterality: N/A;    Allergies: No Known Allergies  Family History: Family History  Problem Relation Age of Onset  . Stomach cancer Mother   . CVA Father   . Bladder Cancer Neg Hx   . Kidney cancer Neg Hx     Social History:  reports that she quit smoking  about 18 years ago. She has a 40.00 pack-year smoking history. She has never used smokeless tobacco. She reports that she does not drink alcohol or use drugs.  ROS: Unable to obtain due to patient's mental status                                        Physical Exam: BP (!) 171/74 (BP Location: Left Arm)   Pulse 81   Temp 98.2 F (36.8 C) (Oral)   Resp 18   Ht 5\' 2"  (1.575 m)   Wt 162 lb (73.5 kg)   SpO2 98%   BMI  29.63 kg/m   Constitutional:  Alert and oriented, No acute distress. HEENT: [place] AT, moist mucus membranes.  Trachea midline, no masses. Cardiovascular: No clubbing, cyanosis, or edema. Respiratory: Normal respiratory effort, no increased work of breathing. GI: Abdomen is soft, nontender, nondistended, no abdominal masses GU: No CVA tenderness.  Skin: No rashes, bruises or suspicious lesions. Lymph: No cervical or inguinal adenopathy. Neurologic: Grossly intact, no focal deficits, moving all 4 extremities. Psychiatric: Normal mood and affect.  Laboratory Data: Lab Results  Component Value Date   WBC 7.0 07/09/2016   HGB 12.0 07/09/2016   HCT 37.1 07/09/2016   MCV 89.3 07/09/2016   PLT 238 07/09/2016    Lab Results  Component Value Date   CREATININE 0.43 (L) 07/09/2016    No results found for: PSA  No results found for: TESTOSTERONE  No results found for: HGBA1C  Urinalysis    Component Value Date/Time   COLORURINE YELLOW (A) 07/08/2016 1450   APPEARANCEUR CLEAR (A) 07/08/2016 1450   APPEARANCEUR Cloudy (A) 01/29/2016 1126   LABSPEC 1.015 07/08/2016 1450   LABSPEC 1.020 01/23/2014 1047   PHURINE 6.0 07/08/2016 1450   GLUCOSEU NEGATIVE 07/08/2016 1450   GLUCOSEU NEGATIVE 01/23/2014 1047   HGBUR LARGE (A) 07/08/2016 1450   BILIRUBINUR NEGATIVE 07/08/2016 1450   BILIRUBINUR Negative 01/13/2016 1110   BILIRUBINUR NEGATIVE 01/23/2014 1047   KETONESUR 20 (A) 07/08/2016 1450   PROTEINUR 30 (A) 07/08/2016 1450   UROBILINOGEN 0.2 08/04/2015 1140   NITRITE NEGATIVE 07/08/2016 1450   LEUKOCYTESUR NEGATIVE 07/08/2016 1450   LEUKOCYTESUR 3+ (A) 01/13/2016 1110   LEUKOCYTESUR TRACE 01/23/2014 1047    Pertinent Imaging: CLINICAL DATA:  Hematuria, confusion, shortness of breath  EXAM: CT ABDOMEN AND PELVIS WITHOUT CONTRAST  TECHNIQUE: Multidetector CT imaging of the abdomen and pelvis was performed following the standard protocol without IV contrast.  COMPARISON:   CT abdomen pelvis of 09/16/2015, and CT abdomen pelvis of 10/22/2012  FINDINGS: Lower chest: The lung bases are clear. The heart is mildly enlarged. Mitral annular calcification is present. No pericardial effusion is noted.  Hepatobiliary: There are few scattered low-attenuation lesions within the liver of uncertain significance. In the patient's arms by adjacent to the body creating artifacts making assessment more difficult. No hepatic dilatation is seen. No calcified gallstones are noted.  Pancreas: The pancreas is normal in size. The pancreatic duct is not dilated.  Spleen: The spleen is unremarkable.  Adrenals/Urinary Tract: The adrenal glands appear normal. No right renal calculi are seen. However there are left renal calculi present the largest of which is in the lower pole measured 11 x 8 mm. No present obstruction on the left is seen. The ureters appear normal in caliber. Unfortunately much of the anatomy of the bladder is obscured by  artifacts created by the left hip replacement.  Stomach/Bowel: The stomach is not well distended but no abnormality is seen. Some sutures are present distally probably due to partial gastrectomy as noted previously. No small bowel distention is evident. There scattered rectosigmoid colon diverticula present. The terminal ileum is unremarkable. The appendix is not definitely visualized.  Vascular/Lymphatic: The abdominal aorta is normal caliber with significant abdominal aortic atherosclerosis present. No adenopathy is seen.  Reproductive: The uterus has previously been resected. Cystic adnexal structures again are noted left larger than right however, compared to the CT the abdomen pelvis from 10/22/2012, these lesions are actually smaller and therefore consistent with a benign process. No free fluid is seen.  Other: None.  Musculoskeletal: The lumbar vertebrae are in normal alignment with diffuse degenerative change.  Left hip replacement is noted. The SI joints are corticated.  IMPRESSION: 1. Left renal calculi, the largest of 11 right 8 mm. No present obstruction. 2. Arch much of the urinary bladder is obscured by artifacts created by the left hip replacement, but the previously noted bladder lesion is not definitely seen. 3. Cystic adnexal lesions left larger than right have decreased since the CT of 2014, and therefore are consistent with a benign process. 4. Abdominal aortic atherosclerosis.  Assessment & Plan:    1. Bladder Cancer -Overdue for surveillance cystoscopy. Will arrange as outpatient. Also will need to again discuss undergoing induction BCG at that time since it is not possible currently due to mental status.  2. Nonobstructive nephrolithiasis -Stable. Recommend active surveillance and avoiding intervention due to age and comorbidities  3. Microscopic hematuria -secondary to above  Urology will sign off. Please call with questions.   Nickie Retort, MD  [facility] [address] [place], [place] [address] [phone]

## 2016-07-09 NOTE — Care Management (Addendum)
patient lives at home with her daughter and grand daughter.  Before this episode of illness, she was ambulatory and able to perform her adls.  Patient does not answer cm when names called.   Spoke with physical therapy and informed patient did not participate. Work up at present is negative. Spoke with attending. Consider Neuro and or psych consult.  Patient currently does not have any services in the home

## 2016-07-09 NOTE — Progress Notes (Signed)
[facility] [place] NAME: Melissa Cochran    MR#:  [id number]  DATE OF BIRTH:  1936/01/24  SUBJECTIVE:   Pt. Here due to AMS/confusion and noted to have malignant HTN.  BP has improved. Mental status waxes and wanes.    REVIEW OF SYSTEMS:    Review of Systems  Constitutional: Negative for chills and fever.  HENT: Negative for congestion and tinnitus.   Eyes: Negative for blurred vision and double vision.  Respiratory: Negative for cough, shortness of breath and wheezing.   Cardiovascular: Negative for chest pain, orthopnea and PND.  Gastrointestinal: Negative for abdominal pain, diarrhea, nausea and vomiting.  Genitourinary: Negative for dysuria and hematuria.  Neurological: Negative for dizziness, sensory change and focal weakness.  All other systems reviewed and are negative.   Nutrition: Heart Healthy Tolerating Diet: Yes Tolerating PT: Await Eval.    DRUG ALLERGIES:  No Known Allergies  VITALS:  Blood pressure (!) 137/52, pulse 66, temperature 98.5 F (36.9 C), temperature source Oral, resp. rate 18, height 5\' 2"  (1.575 m), weight 73.5 kg (162 lb), SpO2 96 %.  PHYSICAL EXAMINATION:   Physical Exam  GENERAL:  81 y.o.-year-old patient lying in the bed in no acute distress.  EYES: Pupils equal, round, reactive to light and accommodation. No scleral icterus. Extraocular muscles intact.  HEENT: Head atraumatic, normocephalic. Oropharynx and nasopharynx clear.  NECK:  Supple, no jugular venous distention. No thyroid enlargement, no tenderness.  LUNGS: Normal breath sounds bilaterally, no wheezing, rales, rhonchi. No use of accessory muscles of respiration.  CARDIOVASCULAR: S1, S2 normal. No murmurs, rubs, or gallops.  ABDOMEN: Soft, nontender, nondistended. Bowel sounds present. No organomegaly or mass.  EXTREMITIES: No cyanosis, clubbing or edema b/l.    NEUROLOGIC: Cranial nerves II through XII are intact. No focal Motor or  sensory deficits b/l.   PSYCHIATRIC: The patient is alert and oriented x 2.  SKIN: No obvious rash, lesion, or ulcer.    LABORATORY PANEL:   CBC  Recent Labs Lab 07/09/16 0640  WBC 7.0  HGB 12.0  HCT 37.1  PLT 238   ------------------------------------------------------------------------------------------------------------------  Chemistries   Recent Labs Lab 07/08/16 1449 07/08/16 2037 07/09/16 0640  NA 133*  --  136  K 4.3  --  4.1  CL 98*  --  103  CO2 27  --  26  GLUCOSE 109*  --  98  BUN 18  --  11  CREATININE 0.59  --  0.43*  CALCIUM 9.5  --  9.0  MG  --  1.8  --   AST 45*  --   --   ALT 17  --   --   ALKPHOS 116  --   --   BILITOT 1.3*  --   --    ------------------------------------------------------------------------------------------------------------------  Cardiac Enzymes  Recent Labs Lab 07/08/16 2151  TROPONINI 0.08*   ------------------------------------------------------------------------------------------------------------------  RADIOLOGY:  Ct Head Wo Contrast  Result Date: 07/08/2016 CLINICAL DATA:  Confusion and hallucinations since last week. EXAM: CT HEAD WITHOUT CONTRAST TECHNIQUE: Contiguous axial images were obtained from the base of the skull through the vertex without intravenous contrast. COMPARISON:  01/12/2013 FINDINGS: Brain: Stable age related cerebral atrophy, ventriculomegaly and periventricular white matter disease. No extra-axial fluid collections are identified. No CT findings for acute hemispheric infarction or intracranial hemorrhage. Stable 2 cm hyperdense extra-axial lesion in the right parietal area consistent with a hemangioma. The brainstem and cerebellum are normal. Vascular: No hyperdense vessels  were definite aneurysm. Stable vascular calcifications. Skull: No skull fracture or bone lesion. Sinuses/Orbits: The paranasal sinuses and mastoid air cells are clear except for right mastoid effusions. The globes are intact.  Other: No scalp lesions or hematoma. IMPRESSION: 1. Stable 2 cm right parietal meningioma. 2. Chronic changes but no acute findings. Electronically Signed   By: Marijo Sanes M.D.   On: 07/08/2016 16:46   Ct Angio Chest Pe W Or Wo Contrast  Result Date: 07/08/2016 CLINICAL DATA:  Worsening confusion and tachycardia. EXAM: CT ANGIOGRAPHY CHEST WITH CONTRAST TECHNIQUE: Multidetector CT imaging of the chest was performed using the standard protocol during bolus administration of intravenous contrast. Multiplanar CT image reconstructions and MIPs were obtained to evaluate the vascular anatomy. CONTRAST:  75 cc Isovue 370 COMPARISON:  None. FINDINGS: Cardiovascular: Heart size upper normal. Coronary artery calcification is noted. No evidence of pericardial effusion. Atherosclerotic calcification is noted in the wall of the thoracic aorta. No evidence for filling defect within the opacified pulmonary arteries to suggest the presence of an acute pulmonary embolus. Mediastinum/Nodes: No mediastinal lymphadenopathy. There is no hilar lymphadenopathy. The esophagus has normal imaging features. 2.0 cm left thyroid nodule evident. There is no axillary lymphadenopathy. Lungs/Pleura: Fine detail of lung parenchyma obscured by respiratory motion. Biapical pleural-parenchymal scarring is evident. Subsegmental atelectasis or linear scarring noted right middle lobe. Centrilobular emphysema noted. Bronchial wall thickening is evident. No focal airspace consolidation. No pulmonary edema or pleural effusion. Upper Abdomen: The liver shows diffusely decreased attenuation suggesting steatosis. Surgical clips are noted in the region of the esophagogastric junction with suture line visible in the stomach, incompletely visualized. Musculoskeletal: Bone windows reveal no worrisome lytic or sclerotic osseous lesions. Review of the MIP images confirms the above findings. IMPRESSION: 1. No CT evidence for acute pulmonary embolus. 2.   Emphysema. (ICD10-J43.9) 3. Coronary artery and thoracic aortic atherosclerosis. 4. 2.0 cm left thyroid nodule. Thyroid ultrasound could be used to further evaluate, as clinically warranted. Electronically Signed   By: Misty Stanley M.D.   On: 07/08/2016 20:38   Dg Chest Port 1 View  Result Date: 07/08/2016 CLINICAL DATA:  New onset altered mental status, tachycardia. Former smoker. EXAM: PORTABLE CHEST 1 VIEW COMPARISON:  Chest x-ray of January 21, 2016 FINDINGS: The lungs are well-expanded and clear. The heart and pulmonary vascularity are normal. There is calcification in the mitral valvular annulus. The mediastinum is normal in width. There is calcification in the wall of the aortic arch. There is gentle dextrocurvature centered in the mid thoracic spine. There surgical clips in the region of the GE junction. IMPRESSION: Mild chronic bronchitic changes. No pneumonia, CHF, nor other acute cardiopulmonary abnormality. Thoracic aortic atherosclerosis. Electronically Signed   By: David  Martinique M.D.   On: 07/08/2016 14:46   Ct Renal Stone Study  Result Date: 07/08/2016 CLINICAL DATA:  Hematuria, confusion, shortness of breath EXAM: CT ABDOMEN AND PELVIS WITHOUT CONTRAST TECHNIQUE: Multidetector CT imaging of the abdomen and pelvis was performed following the standard protocol without IV contrast. COMPARISON:  CT abdomen pelvis of 09/16/2015, and CT abdomen pelvis of 10/22/2012 FINDINGS: Lower chest: The lung bases are clear. The heart is mildly enlarged. Mitral annular calcification is present. No pericardial effusion is noted. Hepatobiliary: There are few scattered low-attenuation lesions within the liver of uncertain significance. In the patient's arms by adjacent to the body creating artifacts making assessment more difficult. No hepatic dilatation is seen. No calcified gallstones are noted. Pancreas: The pancreas is normal in size. The  pancreatic duct is not dilated. Spleen: The spleen is unremarkable.  Adrenals/Urinary Tract: The adrenal glands appear normal. No right renal calculi are seen. However there are left renal calculi present the largest of which is in the lower pole measured 11 x 8 mm. No present obstruction on the left is seen. The ureters appear normal in caliber. Unfortunately much of the anatomy of the bladder is obscured by artifacts created by the left hip replacement. Stomach/Bowel: The stomach is not well distended but no abnormality is seen. Some sutures are present distally probably due to partial gastrectomy as noted previously. No small bowel distention is evident. There scattered rectosigmoid colon diverticula present. The terminal ileum is unremarkable. The appendix is not definitely visualized. Vascular/Lymphatic: The abdominal aorta is normal caliber with significant abdominal aortic atherosclerosis present. No adenopathy is seen. Reproductive: The uterus has previously been resected. Cystic adnexal structures again are noted left larger than right however, compared to the CT the abdomen pelvis from 10/22/2012, these lesions are actually smaller and therefore consistent with a benign process. No free fluid is seen. Other: None. Musculoskeletal: The lumbar vertebrae are in normal alignment with diffuse degenerative change. Left hip replacement is noted. The SI joints are corticated. IMPRESSION: 1. Left renal calculi, the largest of 11 right 8 mm. No present obstruction. 2. Arch much of the urinary bladder is obscured by artifacts created by the left hip replacement, but the previously noted bladder lesion is not definitely seen. 3. Cystic adnexal lesions left larger than right have decreased since the CT of 2014, and therefore are consistent with a benign process. 4. Abdominal aortic atherosclerosis. Electronically Signed   By: Ivar Drape M.D.   On: 07/08/2016 16:56     ASSESSMENT AND PLAN:   81 year old female with past history of hypertension, hyperlipidemia, GERD, COPD, history  of CHF, recent DVT presented to the hospital due to altered mental status.  1. Altered mental status-etiology unclear presently. Suspected to be hypertensive encephalopathy as she presented with malignant hypertension. Patient's blood pressure has improved his mental status is still not back to baseline. -CT head negative for acute pathology. Urinalysis negative, no other metabolic source noted. - follow mental status and if not improving clearing by tomorrow we'll consider neurology/psychiatric input.  2. Accelerated/malignant hypertension-much improved since admission. -Continue metoprolol, Norvasc,  IV Hydralazine as needed.  3. History of recent DVT-continue Eliquis.  CT chest (-) for PE.   4. Hematuria w/ Nephrolithiasis - seen by Urology and no plans further interventions presently.  - hx of bladder cancer and will need cystoscopy as outpatient.    5. Hx of CHF - clinically pt. Is not in CHF.   - cont. To monitor.   Await PT eval and possible d/c in 1-2 days.    All the records are reviewed and case discussed with Care Management/Social Worker. Management plans discussed with the patient, family and they are in agreement.  CODE STATUS: Full code  DVT Prophylaxis: Eliquis  TOTAL TIME TAKING CARE OF THIS PATIENT: 30 minutes.   POSSIBLE D/C IN 1-2 DAYS, DEPENDING ON CLINICAL CONDITION.   Henreitta Leber M.D on 07/09/2016 at 3:36 PM  Between 7am to 6pm - Pager - [phone]  After 6pm go to [email or web address] - Proofreader  [employer]  [facility]  Office  [phone]  CC: Primary care physician; Otilio Miu, MD

## 2016-07-09 NOTE — Care Management Important Message (Signed)
Important Message  Patient Details  Name: LUXIE MARABELLA MRN: [id number] Date of Birth: 02-01-1936   Medicare Important Message Given:  Yes  Grand daughter signed.  Patient confused    Katrina Stack, RN 07/09/2016, 3:35 PM

## 2016-07-09 NOTE — Consult Note (Signed)
[facility] Consult Note  MRN : [id number]  Melissa Cochran is a 81 y.o. (02-12-1936) female who presents with chief complaint of  Chief Complaint  Patient presents with  . Altered Mental Status  . Hallucinations  .  History of Present Illness: I am asked to evaluate the patient for possible IVC filter by Dr Bridgett Larsson.  She is a 81 y.o. female with a known history of CHF, COPD, CAD, hypertension and bladder tumor. The patient was sent from home to the ED due to confusion and hallucinations.  The the family these seemed to be worsening over the past several days.  During my evaluation she is alert, awake, 3, she denies any symptoms of bleeding. But according to her daughter, the patient has been confused for 6 days. She has a chronic cough but no change, no hemoptysis. She was found to have high blood pressure at 193/100, HR 130 and was admitted for cardiac work up.  Current Facility-Administered Medications  Medication Dose Route Frequency Provider Last Rate Last Dose  . acetaminophen (TYLENOL) tablet 650 mg  650 mg Oral Q6H PRN Demetrios Loll, MD   650 mg at 07/09/16 [id number]   Or  . acetaminophen (TYLENOL) suppository 650 mg  650 mg Rectal Q6H PRN Demetrios Loll, MD      . albuterol (PROVENTIL) (2.5 MG/3ML) 0.083% nebulizer solution 2.5 mg  2.5 mg Nebulization Q2H PRN Demetrios Loll, MD      . amLODipine (NORVASC) tablet 10 mg  10 mg Oral Daily Demetrios Loll, MD   10 mg at 07/09/16 [id number]  . apixaban (ELIQUIS) tablet 5 mg  5 mg Oral BID Demetrios Loll, MD   5 mg at 07/09/16 0953  . aspirin chewable tablet 81 mg  81 mg Oral Daily Demetrios Loll, MD   81 mg at 07/09/16 0954  . docusate sodium (COLACE) capsule 100 mg  100 mg Oral BID Demetrios Loll, MD   100 mg at 07/09/16 0954  . hydrALAZINE (APRESOLINE) injection 10 mg  10 mg Intravenous Q6H PRN Demetrios Loll, MD   10 mg at 07/08/16 1919  . metoprolol (LOPRESSOR) injection 5 mg  5 mg Intravenous Q4H PRN Demetrios Loll, MD   5 mg at 07/08/16 1930  .  metoprolol (LOPRESSOR) tablet 100 mg  100 mg Oral BID Demetrios Loll, MD   100 mg at 07/09/16 [id number]  . ondansetron (ZOFRAN) tablet 4 mg  4 mg Oral Q6H PRN Demetrios Loll, MD       Or  . ondansetron [facility]) injection 4 mg  4 mg Intravenous Q6H PRN Demetrios Loll, MD      . senna-docusate (Senokot-S) tablet 1 tablet  1 tablet Oral QHS PRN Demetrios Loll, MD      . traMADol [facility]) tablet 100 mg  100 mg Oral Q6H PRN Demetrios Loll, MD   100 mg at 07/08/16 2142    Past Medical History:  Diagnosis Date  . Adnexal mass   . Anxiety   . Arthritis   . Bladder tumor   . Bleeding ulcer   . CHF (congestive heart failure) ([place])   . CHF (congestive heart failure) ([place])   . COPD (chronic obstructive pulmonary disease) ([place])   . Coronary artery disease   . GERD (gastroesophageal reflux disease)   . Heart block    left  . History of bleeding ulcers   . Hyperlipemia   . Hypertension   . Pneumonia   . Shortness of breath dyspnea  Past Surgical History:  Procedure Laterality Date  . ABDOMINAL HYSTERECTOMY    . CARDIAC CATHETERIZATION Left 10/21/2015   Procedure: Left Heart Cath and Coronary Angiography;  Surgeon: Isaias Cowman, MD;  Location: [facility];  Service: Cardiovascular;  Laterality: Left;  . CARDIAC CATHETERIZATION N/A 10/21/2015   Procedure: Coronary Stent Intervention;  Surgeon: Isaias Cowman, MD;  Location: [facility];  Service: Cardiovascular;  Laterality: N/A;  . CARDIAC SURGERY     cardiac cath  . CYSTOSCOPY W/ RETROGRADES Bilateral 01/20/2016   Procedure: CYSTOSCOPY WITH RETROGRADE PYELOGRAM;  Surgeon: Hollice Espy, MD;  Location: [facility];  Service: Urology;  Laterality: Bilateral;  . CYSTOSCOPY WITH STENT PLACEMENT Right 01/20/2016   Procedure: CYSTOSCOPY WITH STENT PLACEMENT;  Surgeon: Hollice Espy, MD;  Location: [facility];  Service: Urology;  Laterality: Right;  . HIP SURGERY     left  . STOMACH SURGERY    . TRANSURETHRAL RESECTION OF BLADDER TUMOR N/A  01/20/2016   Procedure: TRANSURETHRAL RESECTION OF BLADDER TUMOR (TURBT);  Surgeon: Hollice Espy, MD;  Location: [facility];  Service: Urology;  Laterality: N/A;    Social History Social History  Substance Use Topics  . Smoking status: Former Smoker    Packs/day: 1.00    Years: 40.00    Quit date: 06/13/1998  . Smokeless tobacco: Never Used  . Alcohol use No    Family History Family History  Problem Relation Age of Onset  . Stomach cancer Mother   . CVA Father   . Bladder Cancer Neg Hx   . Kidney cancer Neg Hx   No family history of bleeding/clotting disorders, porphyria or autoimmune disease   No Known Allergies   REVIEW OF SYSTEMS (Negative unless checked)  Constitutional: [] Weight loss  [] Fever  [] Chills Cardiac: [] Chest pain   [] Chest pressure   [] Palpitations   [] Shortness of breath when laying flat   [] Shortness of breath at rest   [] Shortness of breath with exertion. Vascular:  [] Pain in legs with walking   [] Pain in legs at rest   [] Pain in legs when laying flat   [] Claudication   [] Pain in feet when walking  [] Pain in feet at rest  [] Pain in feet when laying flat   [] History of DVT   [] Phlebitis   [] Swelling in legs   [] Varicose veins   [] Non-healing ulcers Pulmonary:   [] Uses home oxygen   [] Productive cough   [] Hemoptysis   [] Wheeze  [] COPD   [] Asthma Neurologic:  [] Dizziness  [] Blackouts   [] Seizures   [] History of stroke   [] History of TIA  [] Aphasia   [] Temporary blindness   [] Dysphagia   [] Weakness or numbness in arms   [] Weakness or numbness in legs Musculoskeletal:  [] Arthritis   [] Joint swelling   [x] Joint pain   [] Low back pain Hematologic:  [] Easy bruising  [] Easy bleeding   [] Hypercoagulable state   [] Anemic  [] Hepatitis Gastrointestinal:  [] Blood in stool   [] Vomiting blood  [] Gastroesophageal reflux/heartburn   [] Difficulty swallowing. Genitourinary:  [x] Chronic kidney disease   [] Difficult urination  [] Frequent urination  [] Burning with urination   [x] Blood  in urine Skin:  [] Rashes   [] Ulcers   [] Wounds Psychological:  [] History of anxiety   []  History of major depression.  Physical Examination  Vitals:   07/08/16 1943 07/08/16 2011 07/09/16 0936 07/09/16 1228  BP: (!) 149/89 (!) 156/59 (!) 171/74 (!) 137/52  Pulse: (!) 110 (!) 134 81 66  Resp: (!) 25 (!) 36 18 18  Temp:  99.6 F (37.6 C) 98.2 F (36.8 C) 98.5 F (36.9 C)  TempSrc:  Oral Oral Oral  SpO2: 97% 94% 98% 96%  Weight:  73.5 kg (162 lb)    Height:       Body mass index is 29.63 kg/m. Gen:  WD/WN, NAD Head: [place]/AT, No temporalis wasting. Prominent temp pulse not noted. Ear/Nose/Throat: Hearing grossly intact, nares w/o erythema or drainage, oropharynx w/o Erythema/Exudate Eyes: Sclera non-icteric, conjunctiva clear Neck: Trachea midline.  No JVD.  Pulmonary:  Good air movement, respirations not labored, equal bilaterally.  Cardiac: RRR, normal S1, S2. Vascular:  Vessel Right Left  Radial Palpable Palpable  Ulnar Palpable Palpable  Brachial Palpable Palpable  Carotid Palpable, without bruit Palpable, without bruit  Aorta Not palpable N/A  Femoral Palpable Palpable  Popliteal Palpable Palpable  PT Palpable Palpable  DP Palpable Palpable   Gastrointestinal: soft, non-tender/non-distended. No guarding/reflex.  Musculoskeletal: M/S 5/5 throughout.  Extremities without ischemic changes.  No deformity or atrophy. No edema. Neurologic: Sensation grossly intact in extremities.  Symmetrical.  Speech is fluent. Motor exam as listed above. Psychiatric: Judgment intact, Mood & affect appropriate for pt's clinical situation. Dermatologic: No rashes or ulcers noted.  No cellulitis or open wounds. Lymph : No Cervical, Axillary, or Inguinal lymphadenopathy.      CBC Lab Results  Component Value Date   WBC 7.0 07/09/2016   HGB 12.0 07/09/2016   HCT 37.1 07/09/2016   MCV 89.3 07/09/2016   PLT 238 07/09/2016    BMET    Component Value Date/Time   NA 136 07/09/2016  0640   NA 141 08/26/2015 1131   NA 131 (L) 01/23/2014 1125   K 4.1 07/09/2016 0640   K 3.9 01/23/2014 1125   CL 103 07/09/2016 0640   CL 94 (L) 01/23/2014 1125   CO2 26 07/09/2016 0640   CO2 32 01/23/2014 1125   GLUCOSE 98 07/09/2016 0640   GLUCOSE 91 01/23/2014 1125   BUN 11 07/09/2016 0640   BUN 16 08/26/2015 1131   BUN 11 01/23/2014 1125   CREATININE 0.43 (L) 07/09/2016 0640   CREATININE 0.72 01/23/2014 1125   CALCIUM 9.0 07/09/2016 0640   CALCIUM 9.4 01/23/2014 1125   GFRNONAA >60 07/09/2016 0640   GFRNONAA >60 01/23/2014 1125   GFRAA >60 07/09/2016 0640   GFRAA >60 01/23/2014 1125   Estimated Creatinine Clearance: 53.6 mL/min (by C-G formula based on SCr of 0.43 mg/dL (L)).  COAG Lab Results  Component Value Date   INR 1.26 07/08/2016   INR 1.19 01/24/2015    Radiology Ct Head Wo Contrast  Result Date: 07/08/2016 CLINICAL DATA:  Confusion and hallucinations since last week. EXAM: CT HEAD WITHOUT CONTRAST TECHNIQUE: Contiguous axial images were obtained from the base of the skull through the vertex without intravenous contrast. COMPARISON:  01/12/2013 FINDINGS: Brain: Stable age related cerebral atrophy, ventriculomegaly and periventricular white matter disease. No extra-axial fluid collections are identified. No CT findings for acute hemispheric infarction or intracranial hemorrhage. Stable 2 cm hyperdense extra-axial lesion in the right parietal area consistent with a hemangioma. The brainstem and cerebellum are normal. Vascular: No hyperdense vessels were definite aneurysm. Stable vascular calcifications. Skull: No skull fracture or bone lesion. Sinuses/Orbits: The paranasal sinuses and mastoid air cells are clear except for right mastoid effusions. The globes are intact. Other: No scalp lesions or hematoma. IMPRESSION: 1. Stable 2 cm right parietal meningioma. 2. Chronic changes but no acute findings. Electronically Signed   By: Ricky Stabs.D.  On: 07/08/2016 16:46    Ct Angio Chest Pe W Or Wo Contrast  Result Date: 07/08/2016 CLINICAL DATA:  Worsening confusion and tachycardia. EXAM: CT ANGIOGRAPHY CHEST WITH CONTRAST TECHNIQUE: Multidetector CT imaging of the chest was performed using the standard protocol during bolus administration of intravenous contrast. Multiplanar CT image reconstructions and MIPs were obtained to evaluate the vascular anatomy. CONTRAST:  75 cc Isovue 370 COMPARISON:  None. FINDINGS: Cardiovascular: Heart size upper normal. Coronary artery calcification is noted. No evidence of pericardial effusion. Atherosclerotic calcification is noted in the wall of the thoracic aorta. No evidence for filling defect within the opacified pulmonary arteries to suggest the presence of an acute pulmonary embolus. Mediastinum/Nodes: No mediastinal lymphadenopathy. There is no hilar lymphadenopathy. The esophagus has normal imaging features. 2.0 cm left thyroid nodule evident. There is no axillary lymphadenopathy. Lungs/Pleura: Fine detail of lung parenchyma obscured by respiratory motion. Biapical pleural-parenchymal scarring is evident. Subsegmental atelectasis or linear scarring noted right middle lobe. Centrilobular emphysema noted. Bronchial wall thickening is evident. No focal airspace consolidation. No pulmonary edema or pleural effusion. Upper Abdomen: The liver shows diffusely decreased attenuation suggesting steatosis. Surgical clips are noted in the region of the esophagogastric junction with suture line visible in the stomach, incompletely visualized. Musculoskeletal: Bone windows reveal no worrisome lytic or sclerotic osseous lesions. Review of the MIP images confirms the above findings. IMPRESSION: 1. No CT evidence for acute pulmonary embolus. 2.  Emphysema. (ICD10-J43.9) 3. Coronary artery and thoracic aortic atherosclerosis. 4. 2.0 cm left thyroid nodule. Thyroid ultrasound could be used to further evaluate, as clinically warranted. Electronically  Signed   By: Misty Stanley M.D.   On: 07/08/2016 20:38   Dg Chest Port 1 View  Result Date: 07/08/2016 CLINICAL DATA:  New onset altered mental status, tachycardia. Former smoker. EXAM: PORTABLE CHEST 1 VIEW COMPARISON:  Chest x-ray of January 21, 2016 FINDINGS: The lungs are well-expanded and clear. The heart and pulmonary vascularity are normal. There is calcification in the mitral valvular annulus. The mediastinum is normal in width. There is calcification in the wall of the aortic arch. There is gentle dextrocurvature centered in the mid thoracic spine. There surgical clips in the region of the GE junction. IMPRESSION: Mild chronic bronchitic changes. No pneumonia, CHF, nor other acute cardiopulmonary abnormality. Thoracic aortic atherosclerosis. Electronically Signed   By: David  Martinique M.D.   On: 07/08/2016 14:46   Ct Renal Stone Study  Result Date: 07/08/2016 CLINICAL DATA:  Hematuria, confusion, shortness of breath EXAM: CT ABDOMEN AND PELVIS WITHOUT CONTRAST TECHNIQUE: Multidetector CT imaging of the abdomen and pelvis was performed following the standard protocol without IV contrast. COMPARISON:  CT abdomen pelvis of 09/16/2015, and CT abdomen pelvis of 10/22/2012 FINDINGS: Lower chest: The lung bases are clear. The heart is mildly enlarged. Mitral annular calcification is present. No pericardial effusion is noted. Hepatobiliary: There are few scattered low-attenuation lesions within the liver of uncertain significance. In the patient's arms by adjacent to the body creating artifacts making assessment more difficult. No hepatic dilatation is seen. No calcified gallstones are noted. Pancreas: The pancreas is normal in size. The pancreatic duct is not dilated. Spleen: The spleen is unremarkable. Adrenals/Urinary Tract: The adrenal glands appear normal. No right renal calculi are seen. However there are left renal calculi present the largest of which is in the lower pole measured 11 x 8 mm. No present  obstruction on the left is seen. The ureters appear normal in caliber. Unfortunately much of the anatomy  of the bladder is obscured by artifacts created by the left hip replacement. Stomach/Bowel: The stomach is not well distended but no abnormality is seen. Some sutures are present distally probably due to partial gastrectomy as noted previously. No small bowel distention is evident. There scattered rectosigmoid colon diverticula present. The terminal ileum is unremarkable. The appendix is not definitely visualized. Vascular/Lymphatic: The abdominal aorta is normal caliber with significant abdominal aortic atherosclerosis present. No adenopathy is seen. Reproductive: The uterus has previously been resected. Cystic adnexal structures again are noted left larger than right however, compared to the CT the abdomen pelvis from 10/22/2012, these lesions are actually smaller and therefore consistent with a benign process. No free fluid is seen. Other: None. Musculoskeletal: The lumbar vertebrae are in normal alignment with diffuse degenerative change. Left hip replacement is noted. The SI joints are corticated. IMPRESSION: 1. Left renal calculi, the largest of 11 right 8 mm. No present obstruction. 2. Arch much of the urinary bladder is obscured by artifacts created by the left hip replacement, but the previously noted bladder lesion is not definitely seen. 3. Cystic adnexal lesions left larger than right have decreased since the CT of 2014, and therefore are consistent with a benign process. 4. Abdominal aortic atherosclerosis. Electronically Signed   By: Ivar Drape M.D.   On: 07/08/2016 16:56      Assessment/Plan 1. History of recent DVT-continue Eliquis.  CT chest (-) for PE, no evidence of bleeding.  No indication for IVC filter at this time  2. Accelerated/malignant hypertension-much improved since admission. -Continue metoprolol, Norvasc,  IV Hydralazine as needed.  3. Altered mental status-etiology  unclear presently. Suspected to be hypertensive encephalopathy as she presented with malignant hypertension. Patient's blood pressure has improved his mental status is still not back to baseline. -CT head negative for acute pathology. Urinalysis negative, no other metabolic source noted. - follow mental status and if not improving clearing by tomorrow we'll consider neurology/psychiatric input.  4. Hematuria w/ Nephrolithiasis - seen by Urology and no plans further interventions presently.  - hx of bladder cancer and will need cystoscopy as outpatient.    5. Hx of CHF - clinically pt. Is not in CHF.   - cont. To monitor.    Hortencia Pilar, MD  07/09/2016 6:35 PM    This note was created with Dragon medical transcription system.  Any error is purely unintentional

## 2016-07-10 LAB — URINE CULTURE: CULTURE: NO GROWTH

## 2016-07-10 MED ORDER — CLOPIDOGREL BISULFATE 75 MG PO TABS: 75.0000 mg | ORAL_TABLET | Freq: Every day | ORAL | 1 refills | Status: DC

## 2016-07-10 MED ORDER — AMLODIPINE BESYLATE 10 MG PO TABS: 10.0000 mg | ORAL_TABLET | Freq: Every day | ORAL | 1 refills | Status: DC

## 2016-07-10 NOTE — Care Management Note (Signed)
Case Management Note  Patient Details  Name: Melissa Cochran MRN: [id number] Date of Birth: 03/05/1936  Subjective/Objective:            Daughter [name] has been unsuccessful in persuading [name] to accept [facility] services. Dr Verdell Carmine is aware.        Action/Plan:   Expected Discharge Date:                  Expected Discharge Plan:     In-House Referral:     Discharge planning Services     Post Acute Care Choice:    Choice offered to:     DME Arranged:    DME Agency:     HH Arranged:    HH Agency:     Status of Service:     If discussed at [employer] of Stay Meetings, dates discussed:    Additional Comments:  Yasin Ducat A, RN 07/10/2016, 1:14 PM

## 2016-07-10 NOTE — Progress Notes (Signed)
Discharge paperwork reviewed with patient and granddaughter at the bedside. Information regarding follow-up appointments, medications and education for all newly prescribed meds was provided, all questions answered. Peripheral IV removed, catheter intact. Heart monitor removed, returned to the nurse station. Transport requested via wheelchair to the lobby for discharge.

## 2016-07-10 NOTE — Clinical Social Work Note (Signed)
During chart review, CSW saw PT has rec SNF. CSW will assess when able.  Santiago Bumpers, MSW, Latanya Presser [phone]

## 2016-07-10 NOTE — Progress Notes (Signed)
Physical Therapy Treatment Patient Details Name: NITI KOHTZ MRN: [id number] DOB: 08-12-1935 Today's Date: 07/10/2016    History of Present Illness Pt is a 81 y.o. female presenting to hospital with AMS and hallucinations.  Pt admitted with malignant htn, tachycardia, acute encephalopathy, hyponatremia, and microscopic hematuria.  Pt with elevated troponin (per cardiology note, likely demand supply ischemia).  B unna boots placed 07/08/16.  PMH includes bladder CA, h/o 2 cm R parietal meningioma, recent h/o R LE DVT, CHF, COPD, CAD, htn, L hip sx, s/p TURBT.    PT Comments    Pt in bed initially declining session stating she was awaiting breakfast.   To edge of bed with min assist and verbal cues for hand placement and techniques.  Sitting EOB with min guard.  Stood with walker at bedside after 3 attempts "Something is holding me down".  Once standing she stood with walker for 2 minutes with poor balance and unable to march in place.  She could lift left leg but was unable to lift right.  She sat back down then stand pivot transferred to recliner at bedside without walker.   Overall impulsive movements which increase her fall risk.  Poor balance and general safety awareness.  Cognition is difficult to assess but visitors stated she is not confused.  She was educated not to get up without assist and was able to state how to call for assist.  Chair alarm set and call bell on her lap.  Discussed with CNA and encouraged +2 assist due to safety.     Follow Up Recommendations  SNF     Equipment Recommendations  Rolling walker with 5" wheels    Recommendations for Other Services       Precautions / Restrictions Precautions Precautions: Fall Precaution Comments: Aspiration Restrictions Weight Bearing Restrictions: No    Mobility  Bed Mobility Overal bed mobility: Needs Assistance Bed Mobility: Supine to Sit;Sit to Supine     Supine to sit: Min assist     General bed mobility  comments: sitting EOB with min guard.  unsafe to be left sitting unattended due to impulsivity and balance.  Transfers Overall transfer level: Needs assistance Equipment used: Rolling walker (2 wheeled);None             General transfer comment: stood on third attempt with overall poor balance and quality.  Difficulty marching in place with walker. resists attempts to assist patient.  used walker only for standing, stand pivot for transfer  Ambulation/Gait             General Gait Details: unsafe at this time   Stairs            Wheelchair Mobility    Modified Rankin (Stroke Patients Only)       Balance Overall balance assessment: Needs assistance Sitting-balance support: No upper extremity supported;Feet supported Sitting balance-Leahy Scale: Fair Sitting balance - Comments: static sitting edge of bed   Standing balance support: Bilateral upper extremity supported Standing balance-Leahy Scale: Poor                      Cognition Arousal/Alertness: Awake/alert Behavior During Therapy: Impulsive Overall Cognitive Status: Difficult to assess                 General Comments: 2 visitors in who stated she was not confused.  cognition remains difficult to assess    Exercises      General Comments  Pertinent Vitals/Pain Pain Assessment: No/denies pain    Home Living                      Prior Function            PT Goals (current goals can now be found in the care plan section) Progress towards PT goals: Progressing toward goals    Frequency    Min 2X/week      PT Plan      Co-evaluation             End of Session Equipment Utilized During Treatment: Gait belt Activity Tolerance: Other (comment) Patient left: in chair;with chair alarm set;with family/visitor present;with call bell/phone within reach Nurse Communication: Mobility status;Precautions       Time: [id number] PT Time Calculation (min)  (ACUTE ONLY): 15 min  Charges:  $Therapeutic Activity: 8-22 mins                    G Codes:       Chesley Noon, PTA 07/10/16, 11:44 AM

## 2016-07-10 NOTE — Progress Notes (Signed)
[facility] Cardiology  SUBJECTIVE: I don't have chest pain   Vitals:   07/09/16 2009 07/09/16 2028 07/10/16 0601 07/10/16 0817  BP: (!) 109/49 140/60 134/72 (!) 160/76  Pulse: 71  78 72  Resp: 14  16 16   Temp: 97.8 F (36.6 C)  97.9 F (36.6 C) 97.8 F (36.6 C)  TempSrc: Oral   Oral  SpO2: 96%  95% 94%  Weight:      Height:         Intake/Output Summary (Last 24 hours) at 07/10/16 1136 Last data filed at 07/09/16 1902  Gross per 24 hour  Intake              120 ml  Output                0 ml  Net              120 ml      PHYSICAL EXAM  General: Well developed, well nourished, in no acute distress HEENT:  Normocephalic and atramatic Neck:  No JVD.  Lungs: Clear bilaterally to auscultation and percussion. Heart: HRRR . Normal S1 and S2 without gallops or murmurs.  Abdomen: Bowel sounds are positive, abdomen soft and non-tender  Msk:  Back normal, normal gait. Normal strength and tone for age. Extremities: No clubbing, cyanosis or edema.   Neuro: Alert and oriented X 3. Psych:  Good affect, responds appropriately   LABS: Basic Metabolic Panel:  Recent Labs  07/08/16 1449 07/08/16 2037 07/09/16 0640  NA 133*  --  136  K 4.3  --  4.1  CL 98*  --  103  CO2 27  --  26  GLUCOSE 109*  --  98  BUN 18  --  11  CREATININE 0.59  --  0.43*  CALCIUM 9.5  --  9.0  MG  --  1.8  --    Liver Function Tests:  Recent Labs  07/08/16 1449  AST 45*  ALT 17  ALKPHOS 116  BILITOT 1.3*  PROT 7.1  ALBUMIN 3.7    Recent Labs  07/08/16 1449  LIPASE 18   CBC:  Recent Labs  07/08/16 1449 07/09/16 0640  WBC 8.2 7.0  NEUTROABS 6.3  --   HGB 13.2 12.0  HCT 39.0 37.1  MCV 87.5 89.3  PLT 281 238   Cardiac Enzymes:  Recent Labs  07/08/16 1449 07/08/16 2151  TROPONINI 0.04* 0.08*   BNP: Invalid input(s): POCBNP D-Dimer: No results for input(s): DDIMER in the last 72 hours. Hemoglobin A1C: No results for input(s): HGBA1C in the last 72 hours. Fasting Lipid  Panel: No results for input(s): CHOL, HDL, LDLCALC, TRIG, CHOLHDL, LDLDIRECT in the last 72 hours. Thyroid Function Tests: No results for input(s): TSH, T4TOTAL, T3FREE, THYROIDAB in the last 72 hours.  Invalid input(s): FREET3 Anemia Panel: No results for input(s): VITAMINB12, FOLATE, FERRITIN, TIBC, IRON, RETICCTPCT in the last 72 hours.  Ct Head Wo Contrast  Result Date: 07/08/2016 CLINICAL DATA:  Confusion and hallucinations since last week. EXAM: CT HEAD WITHOUT CONTRAST TECHNIQUE: Contiguous axial images were obtained from the base of the skull through the vertex without intravenous contrast. COMPARISON:  01/12/2013 FINDINGS: Brain: Stable age related cerebral atrophy, ventriculomegaly and periventricular white matter disease. No extra-axial fluid collections are identified. No CT findings for acute hemispheric infarction or intracranial hemorrhage. Stable 2 cm hyperdense extra-axial lesion in the right parietal area consistent with a hemangioma. The brainstem and cerebellum are normal. Vascular: No  hyperdense vessels were definite aneurysm. Stable vascular calcifications. Skull: No skull fracture or bone lesion. Sinuses/Orbits: The paranasal sinuses and mastoid air cells are clear except for right mastoid effusions. The globes are intact. Other: No scalp lesions or hematoma. IMPRESSION: 1. Stable 2 cm right parietal meningioma. 2. Chronic changes but no acute findings. Electronically Signed   By: Marijo Sanes M.D.   On: 07/08/2016 16:46   Ct Angio Chest Pe W Or Wo Contrast  Result Date: 07/08/2016 CLINICAL DATA:  Worsening confusion and tachycardia. EXAM: CT ANGIOGRAPHY CHEST WITH CONTRAST TECHNIQUE: Multidetector CT imaging of the chest was performed using the standard protocol during bolus administration of intravenous contrast. Multiplanar CT image reconstructions and MIPs were obtained to evaluate the vascular anatomy. CONTRAST:  75 cc Isovue 370 COMPARISON:  None. FINDINGS: Cardiovascular:  Heart size upper normal. Coronary artery calcification is noted. No evidence of pericardial effusion. Atherosclerotic calcification is noted in the wall of the thoracic aorta. No evidence for filling defect within the opacified pulmonary arteries to suggest the presence of an acute pulmonary embolus. Mediastinum/Nodes: No mediastinal lymphadenopathy. There is no hilar lymphadenopathy. The esophagus has normal imaging features. 2.0 cm left thyroid nodule evident. There is no axillary lymphadenopathy. Lungs/Pleura: Fine detail of lung parenchyma obscured by respiratory motion. Biapical pleural-parenchymal scarring is evident. Subsegmental atelectasis or linear scarring noted right middle lobe. Centrilobular emphysema noted. Bronchial wall thickening is evident. No focal airspace consolidation. No pulmonary edema or pleural effusion. Upper Abdomen: The liver shows diffusely decreased attenuation suggesting steatosis. Surgical clips are noted in the region of the esophagogastric junction with suture line visible in the stomach, incompletely visualized. Musculoskeletal: Bone windows reveal no worrisome lytic or sclerotic osseous lesions. Review of the MIP images confirms the above findings. IMPRESSION: 1. No CT evidence for acute pulmonary embolus. 2.  Emphysema. (ICD10-J43.9) 3. Coronary artery and thoracic aortic atherosclerosis. 4. 2.0 cm left thyroid nodule. Thyroid ultrasound could be used to further evaluate, as clinically warranted. Electronically Signed   By: Misty Stanley M.D.   On: 07/08/2016 20:38   Dg Chest Port 1 View  Result Date: 07/08/2016 CLINICAL DATA:  New onset altered mental status, tachycardia. Former smoker. EXAM: PORTABLE CHEST 1 VIEW COMPARISON:  Chest x-ray of January 21, 2016 FINDINGS: The lungs are well-expanded and clear. The heart and pulmonary vascularity are normal. There is calcification in the mitral valvular annulus. The mediastinum is normal in width. There is calcification in the  wall of the aortic arch. There is gentle dextrocurvature centered in the mid thoracic spine. There surgical clips in the region of the GE junction. IMPRESSION: Mild chronic bronchitic changes. No pneumonia, CHF, nor other acute cardiopulmonary abnormality. Thoracic aortic atherosclerosis. Electronically Signed   By: David  Martinique M.D.   On: 07/08/2016 14:46   Ct Renal Stone Study  Result Date: 07/08/2016 CLINICAL DATA:  Hematuria, confusion, shortness of breath EXAM: CT ABDOMEN AND PELVIS WITHOUT CONTRAST TECHNIQUE: Multidetector CT imaging of the abdomen and pelvis was performed following the standard protocol without IV contrast. COMPARISON:  CT abdomen pelvis of 09/16/2015, and CT abdomen pelvis of 10/22/2012 FINDINGS: Lower chest: The lung bases are clear. The heart is mildly enlarged. Mitral annular calcification is present. No pericardial effusion is noted. Hepatobiliary: There are few scattered low-attenuation lesions within the liver of uncertain significance. In the patient's arms by adjacent to the body creating artifacts making assessment more difficult. No hepatic dilatation is seen. No calcified gallstones are noted. Pancreas: The pancreas is normal in  size. The pancreatic duct is not dilated. Spleen: The spleen is unremarkable. Adrenals/Urinary Tract: The adrenal glands appear normal. No right renal calculi are seen. However there are left renal calculi present the largest of which is in the lower pole measured 11 x 8 mm. No present obstruction on the left is seen. The ureters appear normal in caliber. Unfortunately much of the anatomy of the bladder is obscured by artifacts created by the left hip replacement. Stomach/Bowel: The stomach is not well distended but no abnormality is seen. Some sutures are present distally probably due to partial gastrectomy as noted previously. No small bowel distention is evident. There scattered rectosigmoid colon diverticula present. The terminal ileum is  unremarkable. The appendix is not definitely visualized. Vascular/Lymphatic: The abdominal aorta is normal caliber with significant abdominal aortic atherosclerosis present. No adenopathy is seen. Reproductive: The uterus has previously been resected. Cystic adnexal structures again are noted left larger than right however, compared to the CT the abdomen pelvis from 10/22/2012, these lesions are actually smaller and therefore consistent with a benign process. No free fluid is seen. Other: None. Musculoskeletal: The lumbar vertebrae are in normal alignment with diffuse degenerative change. Left hip replacement is noted. The SI joints are corticated. IMPRESSION: 1. Left renal calculi, the largest of 11 right 8 mm. No present obstruction. 2. Arch much of the urinary bladder is obscured by artifacts created by the left hip replacement, but the previously noted bladder lesion is not definitely seen. 3. Cystic adnexal lesions left larger than right have decreased since the CT of 2014, and therefore are consistent with a benign process. 4. Abdominal aortic atherosclerosis. Electronically Signed   By: Ivar Drape M.D.   On: 07/08/2016 16:56     Echo   TELEMETRY: Normal sinus rhythm:  ASSESSMENT AND PLAN:  Active Problems:   HTN (hypertension), malignant    1. Borderline elevated troponin, in the setting of malignant hypertension, in the absence of chest pain, with nondiagnostic ECG, likely demand supply ischemia 2. Altered mental status with confusion and hallucinations 3. Malignant hypertension, blood pressure much improved 4. Right lower extremity DVT on Eliquis 5. Known CAD, S/P was bare-metal stent OM1 10/21/2015  Recommendations  1. Agree with current therapy 2. Continue Eliquis 3. Defer full dose anticoagulation 4. Defer further cardiac diagnostics at this time. May continue further cardiac workup as outpatient.  Signed off for now, please call if any questions    Isaias Cowman, MD,  PhD, [facility] 07/10/2016 11:36 AM

## 2016-07-10 NOTE — Progress Notes (Addendum)
Spoke with patient and her great granddaughter at the bedside. Patient is refusing to have or consider home health care. Educated patient on the reasoning why therapy made these recommendations, expressed healthcare team's concern for her safety at home. Despite excessive education, patient is still refusing. Patient's great granddaughter believes that the patient's daughter can talk to the patient about the needs for home health care once the patient is discharged, at home. Updated Dr. Verdell Carmine on the situation - he stated it was okay to discharge patient. Updated Jeani Hawking with social work - told there was no need to set up any home health care at discharge. Educated the patient and her family member that if they wanted to pursue home health care after discharge, to follow up with her family doctor and they could make a referral for the patient. Patient and great granddaughter verbalized understanding.

## 2016-07-11 NOTE — Discharge Summary (Signed)
[name] NAME: Melissa Cochran    MR#:  [id number]  DATE OF BIRTH:  07-13-1935  DATE OF ADMISSION:  07/08/2016 ADMITTING PHYSICIAN: Melissa Loll, MD  DATE OF DISCHARGE: 07/10/2016  2:25 PM  PRIMARY CARE PHYSICIAN: Melissa Miu, MD    ADMISSION DIAGNOSIS:  Kidney stone [N20.0] Altered mental status, unspecified altered mental status type [R41.82]  DISCHARGE DIAGNOSIS:  Active Problems:   HTN (hypertension), malignant   SECONDARY DIAGNOSIS:   Past Medical History:  Diagnosis Date  . Adnexal mass   . Anxiety   . Arthritis   . Bladder tumor   . Bleeding ulcer   . CHF (congestive heart failure) ([place])   . CHF (congestive heart failure) ([place])   . COPD (chronic obstructive pulmonary disease) ([place])   . Coronary artery disease   . GERD (gastroesophageal reflux disease)   . Heart block    left  . History of bleeding ulcers   . Hyperlipemia   . Hypertension   . Pneumonia   . Shortness of breath dyspnea     HOSPITAL COURSE:   81 year old female with past history of hypertension, hyperlipidemia, GERD, COPD, history of CHF, recent DVT presented to the hospital due to altered mental status.  1. Altered mental status- Suspected to be hypertensive encephalopathy as she presented with malignant hypertension, with underlying mild Dementia.  -Patient's blood pressures have improved since admission and her mental status has also improved and is close to baseline. -her CT head negative for acute pathology. Urinalysis negative, no other metabolic source noted.  2. Accelerated/malignant hypertension-much improved since admission. Secondary to suspected noncompliance. -Patient was given some as needed IV hydralazine, and hypertensives were resumed. Her BP has improved and normalized now.  She will Continue metoprolol, Norvasc.  3. History of recent DVT- she will continue Eliquis.  CT chest (-) for PE.  - seen by Vascular during  hospitalization and no further intervention needed.   4. Hematuria w/ Nephrolithiasis - seen by Urology and no plans further interventions while in the hospital.   - hx of bladder cancer and will need cystoscopy as outpatient.    PT recommended Home Health services but family refused.   DISCHARGE CONDITIONS:   Stable.   CONSULTS OBTAINED:  Treatment Team:  Isaias Cowman, MD Irine Seal, MD Evaristo Bury, MD  DRUG ALLERGIES:  No Known Allergies  DISCHARGE MEDICATIONS:   Allergies as of 07/10/2016   No Known Allergies     Medication List    STOP taking these medications   albuterol 108 (90 Base) MCG/ACT inhaler Commonly known as:  PROVENTIL HFA;VENTOLIN HFA   aspirin 81 MG chewable tablet   sulfamethoxazole-trimethoprim 800-160 MG tablet Commonly known as:  BACTRIM DS,SEPTRA DS     TAKE these medications   acetaminophen 325 MG tablet Commonly known as:  TYLENOL Take 650 mg by mouth every 6 (six) hours as needed for mild pain or headache.   amLODipine 10 MG tablet Commonly known as:  NORVASC Take 1 tablet (10 mg total) by mouth daily. What changed:  how much to take   clopidogrel 75 MG tablet Commonly known as:  PLAVIX Take 1 tablet (75 mg total) by mouth daily.   docusate sodium 100 MG capsule Commonly known as:  COLACE Take 1 capsule (100 mg total) by mouth 2 (two) times daily.   ELIQUIS 5 MG Tabs tablet Generic drug:  apixaban Take 5 mg by mouth 2 (two) times daily.  metoprolol 100 MG tablet Commonly known as:  LOPRESSOR Take 1 tablet (100 mg total) by mouth 2 (two) times daily.   traMADol 50 MG tablet Commonly known as:  ULTRAM Take 100 mg by mouth every 6 (six) hours as needed.         DISCHARGE INSTRUCTIONS:   DIET:  Cardiac diet  DISCHARGE CONDITION:  Stable  ACTIVITY:  Activity as tolerated  OXYGEN:  Home Oxygen: No.   Oxygen Delivery: room air  DISCHARGE LOCATION:  home   If you experience worsening of your  admission symptoms, develop shortness of breath, life threatening emergency, suicidal or homicidal thoughts you must seek medical attention immediately by calling 911 or calling your MD immediately  if symptoms less severe.  You Must read complete instructions/literature along with all the possible adverse reactions/side effects for all the Medicines you take and that have been prescribed to you. Take any new Medicines after you have completely understood and accpet all the possible adverse reactions/side effects.   Please note  You were cared for by a hospitalist during your hospital stay. If you have any questions about your discharge medications or the care you received while you were in the hospital after you are discharged, you can call the unit and asked to speak with the hospitalist on call if the hospitalist that took care of you is not available. Once you are discharged, your primary care physician will handle any further medical issues. Please note that NO REFILLS for any discharge medications will be authorized once you are discharged, as it is imperative that you return to your primary care physician (or establish a relationship with a primary care physician if you do not have one) for your aftercare needs so that they can reassess your need for medications and monitor your lab values.     Today   Mental status back to baseline. BP improved.  No other acute events overnight. Pt. Wants to go home.   VITAL SIGNS:  Blood pressure (!) 163/46, pulse 76, temperature 97.7 F (36.5 C), temperature source Oral, resp. rate 20, height 5\' 2"  (1.575 m), weight 73.5 kg (162 lb), SpO2 93 %.  I/O:  No intake or output data in the 24 hours ending 07/11/16 1257  PHYSICAL EXAMINATION:  GENERAL:  81 y.o.-year-old patient lying in the bed with no acute distress.  EYES: Pupils equal, round, reactive to light and accommodation. No scleral icterus. Extraocular muscles intact.  HEENT: Head atraumatic,  normocephalic. Oropharynx and nasopharynx clear.  NECK:  Supple, no jugular venous distention. No thyroid enlargement, no tenderness.  LUNGS: Normal breath sounds bilaterally, no wheezing, rales,rhonchi. No use of accessory muscles of respiration.  CARDIOVASCULAR: S1, S2 normal. No murmurs, rubs, or gallops.  ABDOMEN: Soft, non-tender, non-distended. Bowel sounds present. No organomegaly or mass.  EXTREMITIES: No pedal edema, cyanosis, or clubbing.  NEUROLOGIC: Cranial nerves II through XII are intact. No focal motor or sensory defecits b/l.  PSYCHIATRIC: The patient is alert and oriented x 3.  SKIN: No obvious rash, lesion, or ulcer.   DATA REVIEW:   CBC  Recent Labs Lab 07/09/16 0640  WBC 7.0  HGB 12.0  HCT 37.1  PLT 238    Chemistries   Recent Labs Lab 07/08/16 1449 07/08/16 2037 07/09/16 0640  NA 133*  --  136  K 4.3  --  4.1  CL 98*  --  103  CO2 27  --  26  GLUCOSE 109*  --  98  BUN  18  --  11  CREATININE 0.59  --  0.43*  CALCIUM 9.5  --  9.0  MG  --  1.8  --   AST 45*  --   --   ALT 17  --   --   ALKPHOS 116  --   --   BILITOT 1.3*  --   --     Cardiac Enzymes  Recent Labs Lab 07/08/16 2151  TROPONINI 0.08*    Microbiology Results  Results for orders placed or performed during the hospital encounter of 07/08/16  Blood Culture (routine x 2)     Status: None (Preliminary result)   Collection Time: 07/08/16  2:49 PM  Result Value Ref Range Status   Specimen Description BLOOD RIGHT AC  Final   Special Requests BOTTLES DRAWN AEROBIC AND ANAEROBIC BCAV  Final   Culture NO GROWTH 3 DAYS  Final   Report Status PENDING  Incomplete  Urine culture     Status: None   Collection Time: 07/08/16  2:50 PM  Result Value Ref Range Status   Specimen Description URINE, RANDOM  Final   Special Requests NONE  Final   Culture   Final    NO GROWTH Performed at [facility], [place] [address] [place], [place] [address]    Report Status 07/10/2016 FINAL  Final   Blood Culture (routine x 2)     Status: None (Preliminary result)   Collection Time: 07/08/16  8:37 PM  Result Value Ref Range Status   Specimen Description BLOOD RIGHT HAND  Final   Special Requests BOTTLES DRAWN AEROBIC AND ANAEROBIC BCLV  Final   Culture NO GROWTH 3 DAYS  Final   Report Status PENDING  Incomplete    RADIOLOGY:  No results found.    Management plans discussed with the patient, family and they are in agreement.  CODE STATUS:  Code Status History    Date Active Date Inactive Code Status Order ID Comments User Context   07/08/2016  8:01 PM 07/10/2016  5:30 PM Full Code [id number]  Melissa Loll, MD Inpatient   01/22/2016  1:57 AM 01/23/2016  9:37 PM Full Code [id number]  Harvie Bridge, DO Inpatient   10/21/2015  9:36 AM 10/22/2015  3:11 PM Full Code [id number]  Isaias Cowman, MD Inpatient   01/24/2015  9:30 PM 01/28/2015  5:07 PM Full Code [id number]  Henreitta Leber, MD Inpatient      TOTAL TIME TAKING CARE OF THIS PATIENT: 40 minutes.    Henreitta Leber M.D on 07/11/2016 at 12:57 PM  Between 7am to 6pm - Pager - [phone]  After 6pm go to [email or web address] - Proofreader  [facility]  Office  [phone]  CC: Primary care physician; Melissa Miu, MD

## 2016-07-12 ENCOUNTER — Other Ambulatory Visit: Payer: Self-pay

## 2016-07-13 ENCOUNTER — Ambulatory Visit: Payer: Medicare Other | Admitting: Family Medicine

## 2016-07-13 LAB — CULTURE, BLOOD (ROUTINE X 2)
CULTURE: NO GROWTH
Culture: NO GROWTH

## 2016-07-15 ENCOUNTER — Ambulatory Visit (INDEPENDENT_AMBULATORY_CARE_PROVIDER_SITE_OTHER): Payer: Medicare Other | Admitting: Vascular Surgery

## 2016-07-15 ENCOUNTER — Encounter (INDEPENDENT_AMBULATORY_CARE_PROVIDER_SITE_OTHER): Payer: Self-pay | Admitting: Vascular Surgery

## 2016-07-15 VITALS — BP 148/75 | HR 56 | Resp 16 | Ht 65.0 in | Wt 158.0 lb

## 2016-07-15 DIAGNOSIS — I872 Venous insufficiency (chronic) (peripheral): Secondary | ICD-10-CM | POA: Diagnosis not present

## 2016-07-15 NOTE — Progress Notes (Signed)
History of Present Illness  There is no documented history at this time  Assessments & Plan   There are no diagnoses linked to this encounter.    Additional instructions  Subjective:  Patient presents with venous ulcer of the Bilateral lower extremity.    Procedure:  3 layer unna wrap was placed Bilateral lower extremity.   Plan:   Follow up in one week.  

## 2016-07-16 ENCOUNTER — Other Ambulatory Visit: Payer: Medicare Other | Admitting: Urology

## 2016-07-20 ENCOUNTER — Ambulatory Visit (INDEPENDENT_AMBULATORY_CARE_PROVIDER_SITE_OTHER): Payer: Medicare Other | Admitting: Vascular Surgery

## 2016-07-20 ENCOUNTER — Encounter (INDEPENDENT_AMBULATORY_CARE_PROVIDER_SITE_OTHER): Payer: Medicare Other

## 2016-07-22 ENCOUNTER — Encounter (INDEPENDENT_AMBULATORY_CARE_PROVIDER_SITE_OTHER): Payer: Self-pay

## 2016-07-22 ENCOUNTER — Ambulatory Visit (INDEPENDENT_AMBULATORY_CARE_PROVIDER_SITE_OTHER): Payer: Medicare Other | Admitting: Vascular Surgery

## 2016-07-22 VITALS — BP 137/70 | HR 62 | Resp 18 | Wt 150.0 lb

## 2016-07-22 DIAGNOSIS — I2510 Atherosclerotic heart disease of native coronary artery without angina pectoris: Secondary | ICD-10-CM

## 2016-07-22 DIAGNOSIS — M7989 Other specified soft tissue disorders: Secondary | ICD-10-CM | POA: Diagnosis not present

## 2016-07-22 NOTE — Progress Notes (Signed)
History of Present Illness  There is no documented history at this time  Assessments & Plan   There are no diagnoses linked to this encounter.    Additional instructions  Subjective:  Patient presents with venous ulcer of the Bilateral lower extremity.    Procedure:  3 layer unna wrap was placed Bilateral lower extremity.   Plan:   Follow up in one week.  

## 2016-07-29 ENCOUNTER — Ambulatory Visit (INDEPENDENT_AMBULATORY_CARE_PROVIDER_SITE_OTHER): Payer: Medicare Other | Admitting: Vascular Surgery

## 2016-07-29 VITALS — BP 151/74 | HR 55 | Resp 12 | Ht 68.0 in | Wt 152.0 lb

## 2016-07-29 DIAGNOSIS — I2510 Atherosclerotic heart disease of native coronary artery without angina pectoris: Secondary | ICD-10-CM | POA: Diagnosis not present

## 2016-07-29 DIAGNOSIS — M7989 Other specified soft tissue disorders: Secondary | ICD-10-CM | POA: Diagnosis not present

## 2016-07-29 DIAGNOSIS — I872 Venous insufficiency (chronic) (peripheral): Secondary | ICD-10-CM | POA: Diagnosis not present

## 2016-08-05 ENCOUNTER — Ambulatory Visit (INDEPENDENT_AMBULATORY_CARE_PROVIDER_SITE_OTHER): Payer: Medicare Other | Admitting: Vascular Surgery

## 2016-08-05 ENCOUNTER — Encounter (INDEPENDENT_AMBULATORY_CARE_PROVIDER_SITE_OTHER): Payer: Self-pay

## 2016-08-05 VITALS — HR 67 | Resp 16 | Ht 68.0 in

## 2016-08-05 DIAGNOSIS — I872 Venous insufficiency (chronic) (peripheral): Secondary | ICD-10-CM | POA: Diagnosis not present

## 2016-08-05 DIAGNOSIS — I2510 Atherosclerotic heart disease of native coronary artery without angina pectoris: Secondary | ICD-10-CM

## 2016-08-05 NOTE — Progress Notes (Signed)
History of Present Illness  There is no documented history at this time  Assessments & Plan   There are no diagnoses linked to this encounter.    Additional instructions  Subjective:  Patient presents with venous ulcer of the Bilateral lower extremity.    Procedure:  3 layer unna wrap was placed Bilateral lower extremity.   Plan:   Follow up in one week.  

## 2016-08-10 NOTE — Progress Notes (Signed)
Subjective:    Patient ID: Melissa Cochran, female    DOB: Mar 09, 1936, 81 y.o.   MRN: [id number] Chief Complaint  Patient presents with  . Wound Check   Patient presents for a month unna boot / lower extremity edema / bilateral lower extremity ulceration follow up. She has undergone weekly unna wrap therapy. She reports improvement in both swelling and ulceration however the ulceration are still present. We review appropriate elevation techniques. She reports improved lower extremity pain. She denies any fever, nausea or vomiting.    Review of Systems  Constitutional: Negative.   HENT: Negative.   Eyes: Negative.   Respiratory: Negative.   Cardiovascular: Positive for leg swelling.  Gastrointestinal: Negative.   Endocrine: Negative.   Genitourinary: Negative.   Skin: Positive for wound.  Allergic/Immunologic: Negative.   Neurological: Negative.   Hematological: Negative.   Psychiatric/Behavioral: Negative.       Objective:   Physical Exam  Constitutional: She is oriented to person, place, and time. She appears well-developed and well-nourished. No distress.  HENT:  Head: Normocephalic and atraumatic.  Eyes: Conjunctivae are normal. Pupils are equal, round, and reactive to light.  Neck: Normal range of motion.  Cardiovascular: Normal rate, regular rhythm and normal heart sounds.   Pulses:      Radial pulses are 2+ on the right side, and 2+ on the left side.  Pulmonary/Chest: Effort normal.  Musculoskeletal: Normal range of motion. She exhibits edema (Moderate 1+ Pitting Edema).  Neurological: She is alert and oriented to person, place, and time.  Skin: She is not diaphoretic.  Scattered superficial bilateral ulcerations - improvement but still present. Non-infected. No cellulitis.   Psychiatric: She has a normal mood and affect. Her behavior is normal. Judgment and thought content normal.  Vitals reviewed.  BP (!) 151/74 (BP Location: Right Arm)   Pulse (!) 55    Resp 12   Ht 5\' 8"  (1.727 m)   Wt 152 lb (68.9 kg)   BMI 23.11 kg/m   Past Medical History:  Diagnosis Date  . Adnexal mass   . Anxiety   . Arthritis   . Bladder tumor   . Bleeding ulcer   . CHF (congestive heart failure) ([place])   . CHF (congestive heart failure) ([place])   . COPD (chronic obstructive pulmonary disease) ([place])   . Coronary artery disease   . GERD (gastroesophageal reflux disease)   . Heart block    left  . History of bleeding ulcers   . Hyperlipemia   . Hypertension   . Pneumonia   . Shortness of breath dyspnea    Social History   Social History  . Marital status: Widowed    Spouse name: N/A  . Number of children: N/A  . Years of education: N/A   Occupational History  . Not on file.   Social History Main Topics  . Smoking status: Former Smoker    Packs/day: 1.00    Years: 40.00    Quit date: 06/13/1998  . Smokeless tobacco: Never Used  . Alcohol use No  . Drug use: No  . Sexual activity: Not Currently   Other Topics Concern  . Not on file   Social History Narrative  . No narrative on file   Past Surgical History:  Procedure Laterality Date  . ABDOMINAL HYSTERECTOMY    . CARDIAC CATHETERIZATION Left 10/21/2015   Procedure: Left Heart Cath and Coronary Angiography;  Surgeon: Isaias Cowman, MD;  Location: [facility];  Service: Cardiovascular;  Laterality: Left;  . CARDIAC CATHETERIZATION N/A 10/21/2015   Procedure: Coronary Stent Intervention;  Surgeon: Isaias Cowman, MD;  Location: [facility];  Service: Cardiovascular;  Laterality: N/A;  . CARDIAC SURGERY     cardiac cath  . CYSTOSCOPY W/ RETROGRADES Bilateral 01/20/2016   Procedure: CYSTOSCOPY WITH RETROGRADE PYELOGRAM;  Surgeon: Hollice Espy, MD;  Location: [facility];  Service: Urology;  Laterality: Bilateral;  . CYSTOSCOPY WITH STENT PLACEMENT Right 01/20/2016   Procedure: CYSTOSCOPY WITH STENT PLACEMENT;  Surgeon: Hollice Espy, MD;  Location: [facility];   Service: Urology;  Laterality: Right;  . HIP SURGERY     left  . STOMACH SURGERY    . TRANSURETHRAL RESECTION OF BLADDER TUMOR N/A 01/20/2016   Procedure: TRANSURETHRAL RESECTION OF BLADDER TUMOR (TURBT);  Surgeon: Hollice Espy, MD;  Location: [facility];  Service: Urology;  Laterality: N/A;   Family History  Problem Relation Age of Onset  . Stomach cancer Mother   . CVA Father   . Bladder Cancer Neg Hx   . Kidney cancer Neg Hx    No Known Allergies     Assessment & Plan:  Patient presents for a month unna boot / lower extremity edema / bilateral lower extremity ulceration follow up. She has undergone weekly unna wrap therapy. She reports improvement in both swelling and ulceration however the ulceration are still present. We review appropriate elevation techniques. She reports improved lower extremity pain. She denies any fever, nausea or vomiting.   1. Venous insufficiency of both lower extremities - Improvement Improvement in both swelling and ulcerations. Extremities are not erythematous. No cellulitis noted. Continue unna wraps until ulceration are completely healed.   2. Swelling of limb - Improvement As above.  Current Outpatient Prescriptions on File Prior to Visit  Medication Sig Dispense Refill  . acetaminophen (TYLENOL) 325 MG tablet Take 650 mg by mouth every 6 (six) hours as needed for mild pain or headache.     Marland Kitchen amLODipine (NORVASC) 10 MG tablet Take 1 tablet (10 mg total) by mouth daily. 30 tablet 1  . apixaban (ELIQUIS) 5 MG TABS tablet Take 5 mg by mouth 2 (two) times daily.    . clopidogrel (PLAVIX) 75 MG tablet Take 1 tablet (75 mg total) by mouth daily. 30 tablet 1  . docusate sodium (COLACE) 100 MG capsule Take 1 capsule (100 mg total) by mouth 2 (two) times daily. 60 capsule 0  . hydrochlorothiazide (HYDRODIURIL) 12.5 MG tablet Take by mouth.    . metoprolol (LOPRESSOR) 100 MG tablet Take 1 tablet (100 mg total) by mouth 2 (two) times daily. 60 tablet 0  .  traMADol (ULTRAM) 50 MG tablet Take 100 mg by mouth every 6 (six) hours as needed.    Marland Kitchen aspirin EC 81 MG tablet Take by mouth.     Current Facility-Administered Medications on File Prior to Visit  Medication Dose Route Frequency Provider Last Rate Last Dose  . apixaban (ELIQUIS) tablet 5 mg  5 mg Oral BID Algernon Huxley, MD      . traMADol Veatrice Bourbon) tablet 50 mg  50 mg Oral Q6H PRN Algernon Huxley, MD        There are no Patient Instructions on file for this visit. No Follow-up on file.   Draden Cottingham A Amadeus Oyama, PA-C

## 2016-08-12 ENCOUNTER — Ambulatory Visit (INDEPENDENT_AMBULATORY_CARE_PROVIDER_SITE_OTHER): Payer: Medicare Other | Admitting: Vascular Surgery

## 2016-08-12 DIAGNOSIS — M7989 Other specified soft tissue disorders: Secondary | ICD-10-CM

## 2016-08-12 DIAGNOSIS — I2510 Atherosclerotic heart disease of native coronary artery without angina pectoris: Secondary | ICD-10-CM | POA: Diagnosis not present

## 2016-08-12 NOTE — Progress Notes (Signed)
History of Present Illness  There is no documented history at this time  Assessments & Plan   There are no diagnoses linked to this encounter.    Additional instructions  Subjective:  Patient presents with venous ulcer of the Bilateral lower extremity.    Procedure:  3 layer unna wrap was placed Bilateral lower extremity.   Plan:   Follow up in one week.  

## 2016-08-19 ENCOUNTER — Encounter (INDEPENDENT_AMBULATORY_CARE_PROVIDER_SITE_OTHER): Payer: Self-pay

## 2016-08-19 ENCOUNTER — Ambulatory Visit (INDEPENDENT_AMBULATORY_CARE_PROVIDER_SITE_OTHER): Payer: Medicare Other | Admitting: Vascular Surgery

## 2016-08-19 VITALS — BP 158/80 | HR 93 | Resp 16 | Ht 66.0 in | Wt 154.0 lb

## 2016-08-19 DIAGNOSIS — I872 Venous insufficiency (chronic) (peripheral): Secondary | ICD-10-CM | POA: Diagnosis not present

## 2016-08-19 DIAGNOSIS — I2510 Atherosclerotic heart disease of native coronary artery without angina pectoris: Secondary | ICD-10-CM | POA: Diagnosis not present

## 2016-08-19 NOTE — Progress Notes (Signed)
History of Present Illness  There is no documented history at this time  Assessments & Plan   There are no diagnoses linked to this encounter.    Additional instructions  Subjective:  Patient presents with venous ulcer of the Left lower extremity.    Procedure:  3 layer unna wrap was placed Left lower extremity.   Plan:   Follow up in one week.  

## 2016-08-24 ENCOUNTER — Ambulatory Visit (INDEPENDENT_AMBULATORY_CARE_PROVIDER_SITE_OTHER): Payer: Medicare Other | Admitting: Vascular Surgery

## 2016-08-24 ENCOUNTER — Encounter (INDEPENDENT_AMBULATORY_CARE_PROVIDER_SITE_OTHER): Payer: Self-pay | Admitting: Vascular Surgery

## 2016-08-24 VITALS — BP 136/71 | HR 71 | Resp 16 | Ht 66.0 in | Wt 151.0 lb

## 2016-08-24 DIAGNOSIS — I2510 Atherosclerotic heart disease of native coronary artery without angina pectoris: Secondary | ICD-10-CM | POA: Diagnosis not present

## 2016-08-24 DIAGNOSIS — I10 Essential (primary) hypertension: Secondary | ICD-10-CM | POA: Diagnosis not present

## 2016-08-24 DIAGNOSIS — M7989 Other specified soft tissue disorders: Secondary | ICD-10-CM | POA: Diagnosis not present

## 2016-08-24 DIAGNOSIS — I70244 Atherosclerosis of native arteries of left leg with ulceration of heel and midfoot: Secondary | ICD-10-CM

## 2016-08-24 DIAGNOSIS — I70234 Atherosclerosis of native arteries of right leg with ulceration of heel and midfoot: Secondary | ICD-10-CM

## 2016-08-24 DIAGNOSIS — I82413 Acute embolism and thrombosis of femoral vein, bilateral: Secondary | ICD-10-CM

## 2016-08-24 NOTE — Progress Notes (Signed)
MRN : [id number]  Melissa Cochran is a 81 y.o. ([birth year]) female who presents with chief complaint of  Chief Complaint  Patient presents with  . Ulcer    Unna boot check  .  History of Present Illness: Patient returns today in follow up of DVT and leg swelling. Her leg swelling is markedly improved. Her ulcerations are much better although she does still have several small dry scabs on both feet and ankle areas. Her pain is better. Financially, she has had difficulty affording her medications but has continued to take them. She does not have any chest pain or shortness of breath currently.  Current Outpatient Prescriptions  Medication Sig Dispense Refill  . acetaminophen (TYLENOL) 325 MG tablet Take 650 mg by mouth every 6 (six) hours as needed for mild pain or headache.     Marland Kitchen amLODipine (NORVASC) 10 MG tablet Take 1 tablet (10 mg total) by mouth daily. 30 tablet 1  . apixaban (ELIQUIS) 5 MG TABS tablet Take 5 mg by mouth 2 (two) times daily.    Marland Kitchen aspirin EC 81 MG tablet Take by mouth.    . clopidogrel (PLAVIX) 75 MG tablet Take 1 tablet (75 mg total) by mouth daily. 30 tablet 1  . docusate sodium (COLACE) 100 MG capsule Take 1 capsule (100 mg total) by mouth 2 (two) times daily. 60 capsule 0  . hydrochlorothiazide (HYDRODIURIL) 12.5 MG tablet Take by mouth.    . metoprolol (LOPRESSOR) 100 MG tablet Take 1 tablet (100 mg total) by mouth 2 (two) times daily. 60 tablet 0  . traMADol (ULTRAM) 50 MG tablet Take 100 mg by mouth every 6 (six) hours as needed.     Current Facility-Administered Medications  Medication Dose Route Frequency Provider Last Rate Last Dose  . apixaban (ELIQUIS) tablet 5 mg  5 mg Oral BID Algernon Huxley, MD      . traMADol Veatrice Bourbon) tablet 50 mg  50 mg Oral Q6H PRN Algernon Huxley, MD        Past Medical History:  Diagnosis Date  . Adnexal mass   . Anxiety   . Arthritis   . Bladder tumor   . Bleeding ulcer   . CHF (congestive heart failure) ([place])   . CHF  (congestive heart failure) ([place])   . COPD (chronic obstructive pulmonary disease) ([place])   . Coronary artery disease   . GERD (gastroesophageal reflux disease)   . Heart block    left  . History of bleeding ulcers   . Hyperlipemia   . Hypertension   . Pneumonia   . Shortness of breath dyspnea     Past Surgical History:  Procedure Laterality Date  . ABDOMINAL HYSTERECTOMY    . CARDIAC CATHETERIZATION Left 10/21/2015   Procedure: Left Heart Cath and Coronary Angiography;  Surgeon: Isaias Cowman, MD;  Location: [facility];  Service: Cardiovascular;  Laterality: Left;  . CARDIAC CATHETERIZATION N/A 10/21/2015   Procedure: Coronary Stent Intervention;  Surgeon: Isaias Cowman, MD;  Location: [place];  Service: Cardiovascular;  Laterality: N/A;  . CARDIAC SURGERY     cardiac cath  . CYSTOSCOPY W/ RETROGRADES Bilateral 01/20/2016   Procedure: CYSTOSCOPY WITH RETROGRADE PYELOGRAM;  Surgeon: Hollice Espy, MD;  Location: [facility];  Service: Urology;  Laterality: Bilateral;  . CYSTOSCOPY WITH STENT PLACEMENT Right 01/20/2016   Procedure: CYSTOSCOPY WITH STENT PLACEMENT;  Surgeon: Hollice Espy, MD;  Location: [facility];  Service: Urology;  Laterality: Right;  .  HIP SURGERY     left  . STOMACH SURGERY    . TRANSURETHRAL RESECTION OF BLADDER TUMOR N/A 01/20/2016   Procedure: TRANSURETHRAL RESECTION OF BLADDER TUMOR (TURBT);  Surgeon: Hollice Espy, MD;  Location: [facility];  Service: Urology;  Laterality: N/A;    Social History Social History  Substance Use Topics  . Smoking status: Former Smoker    Packs/day: 1.00    Years: 40.00    Quit date: 06/13/1998  . Smokeless tobacco: Never Used  . Alcohol use No    Family History Family History  Problem Relation Age of Onset  . Stomach cancer Mother   . CVA Father   . Bladder Cancer Neg Hx   . Kidney cancer Neg Hx     No Known Allergies   REVIEW OF SYSTEMS (Negative unless checked)  Constitutional:  '[]'$ Weight loss  '[]'$ Fever  '[]'$ Chills Cardiac: '[]'$ Chest pain   '[]'$ Chest pressure   '[]'$ Palpitations   '[]'$ Shortness of breath when laying flat   '[]'$ Shortness of breath at rest   '[]'$ Shortness of breath with exertion. Vascular:  '[]'$ Pain in legs with walking   '[]'$ Pain in legs at rest   '[]'$ Pain in legs when laying flat   '[]'$ Claudication   '[]'$ Pain in feet when walking  '[]'$ Pain in feet at rest  '[]'$ Pain in feet when laying flat   '[x]'$ History of DVT   '[x]'$ Phlebitis   '[x]'$ Swelling in legs   '[]'$ Varicose veins   '[x]'$ Non-healing ulcers Pulmonary:   '[]'$ Uses home oxygen   '[]'$ Productive cough   '[]'$ Hemoptysis   '[]'$ Wheeze  '[]'$ COPD   '[]'$ Asthma Neurologic:  '[]'$ Dizziness  '[]'$ Blackouts   '[]'$ Seizures   '[]'$ History of stroke   '[]'$ History of TIA  '[]'$ Aphasia   '[]'$ Temporary blindness   '[]'$ Dysphagia   '[]'$ Weakness or numbness in arms   '[]'$ Weakness or numbness in legs Musculoskeletal:  '[x]'$ Arthritis   '[]'$ Joint swelling   '[]'$ Joint pain   '[]'$ Low back pain Hematologic:  '[]'$ Easy bruising  '[]'$ Easy bleeding   '[]'$ Hypercoagulable state   '[]'$ Anemic   Gastrointestinal:  '[]'$ Blood in stool   '[]'$ Vomiting blood  '[]'$ Gastroesophageal reflux/heartburn   '[]'$ Abdominal pain Genitourinary:  '[x]'$ Chronic kidney disease   '[]'$ Difficult urination  '[]'$ Frequent urination  '[]'$ Burning with urination   '[]'$ Hematuria Skin:  '[]'$ Rashes   '[x]'$ Ulcers   '[x]'$ Wounds Psychological:  '[]'$ History of anxiety   '[]'$  History of major depression.  Physical Examination  BP 136/71 (BP Location: Right Arm)   Pulse 71   Resp 16   Ht '5\' 6"'$  (1.676 m)   Wt 151 lb (68.5 kg)   BMI 24.37 kg/m  Gen:  WD/WN, NAD Head: [place]/AT, No temporalis wasting. Ear/Nose/Throat: Hearing grossly intact, nares w/o erythema or drainage, trachea midline Eyes: Conjunctiva clear. Sclera non-icteric Neck: Supple.  No JVD.  Pulmonary:  Good air movement, no use of accessory muscles.  Cardiac: RRR, normal S1, S2 Vascular:  Vessel Right Left  Radial Palpable Palpable  Ulnar Palpable Palpable  Brachial Palpable Palpable  Carotid Palpable, without bruit  Palpable, without bruit  Aorta Not palpable N/A  Femoral Palpable Palpable  Popliteal Not Palpable Not Palpable  PT Trace Palpable Not Palpable  DP Not Palpable Not Palpable   Gastrointestinal: soft, non-tender/non-distended. No guarding/reflex.  Musculoskeletal: M/S 5/5 throughout.  No deformity or atrophy. 1-2+ bilateral lower extremity edema. Neurologic: Sensation grossly intact in extremities.  Symmetrical.  Speech is fluent.  Psychiatric: Judgment intact, Mood & affect appropriate for pt's clinical situation. Dermatologic: Several dry small scabs on both feet and toe areas. Calf ulcerations are markedly improved. Lymph :  No Cervical, Axillary, or Inguinal lymphadenopathy.      Labs Recent Results (from the past 2160 hour(s))  Lactic acid, plasma     Status: None   Collection Time: 07/08/16  2:49 PM  Result Value Ref Range   Lactic Acid, Venous 1.4 0.5 - 1.9 mmol/L  Comprehensive metabolic panel     Status: Abnormal   Collection Time: 07/08/16  2:49 PM  Result Value Ref Range   Sodium 133 (L) 135 - 145 mmol/L   Potassium 4.3 3.5 - 5.1 mmol/L   Chloride 98 (L) 101 - 111 mmol/L   CO2 27 22 - 32 mmol/L   Glucose, Bld 109 (H) 65 - 99 mg/dL   BUN 18 6 - 20 mg/dL   Creatinine, Ser 0.59 0.44 - 1.00 mg/dL   Calcium 9.5 8.9 - 10.3 mg/dL   Total Protein 7.1 6.5 - 8.1 g/dL   Albumin 3.7 3.5 - 5.0 g/dL   AST 45 (H) 15 - 41 U/L   ALT 17 14 - 54 U/L   Alkaline Phosphatase 116 38 - 126 U/L   Total Bilirubin 1.3 (H) 0.3 - 1.2 mg/dL   GFR calc non Af Amer >60 >60 mL/min   GFR calc Af Amer >60 >60 mL/min    Comment: (NOTE) The eGFR has been calculated using the CKD EPI equation. This calculation has not been validated in all clinical situations. eGFR's persistently <60 mL/min signify possible Chronic Kidney Disease.    Anion gap 8 5 - 15  Lipase, blood     Status: None   Collection Time: 07/08/16  2:49 PM  Result Value Ref Range   Lipase 18 11 - 51 U/L  Troponin I     Status:  Abnormal   Collection Time: 07/08/16  2:49 PM  Result Value Ref Range   Troponin I 0.04 (HH) <0.03 ng/mL    Comment: CRITICAL RESULT CALLED TO, READ BACK BY AND VERIFIED WITH ANNA HOLT AT 1532 07/08/2016 BY [facility].   CBC WITH DIFFERENTIAL     Status: Abnormal   Collection Time: 07/08/16  2:49 PM  Result Value Ref Range   WBC 8.2 3.6 - 11.0 K/uL   RBC 4.46 3.80 - 5.20 MIL/uL   Hemoglobin 13.2 12.0 - 16.0 g/dL   HCT 39.0 35.0 - 47.0 %   MCV 87.5 80.0 - 100.0 fL   MCH 29.5 26.0 - 34.0 pg   MCHC 33.7 32.0 - 36.0 g/dL   RDW 14.9 (H) 11.5 - 14.5 %   Platelets 281 150 - 440 K/uL   Neutrophils Relative % 77 %   Neutro Abs 6.3 1.4 - 6.5 K/uL   Lymphocytes Relative 13 %   Lymphs Abs 1.1 1.0 - 3.6 K/uL   Monocytes Relative 10 %   Monocytes Absolute 0.8 0.2 - 0.9 K/uL   Eosinophils Relative 0 %   Eosinophils Absolute 0.0 0 - 0.7 K/uL   Basophils Relative 0 %   Basophils Absolute 0.0 0 - 0.1 K/uL  Procalcitonin     Status: None   Collection Time: 07/08/16  2:49 PM  Result Value Ref Range   Procalcitonin <0.10 ng/mL    Comment:        Interpretation: PCT (Procalcitonin) <= 0.5 ng/mL: Systemic infection (sepsis) is not likely. Local bacterial infection is possible. (NOTE)         ICU PCT Algorithm               Non ICU PCT Algorithm    ----------------------------     ------------------------------  PCT < 0.25 ng/mL                 PCT < 0.1 ng/mL     Stopping of antibiotics            Stopping of antibiotics       strongly encouraged.               strongly encouraged.    ----------------------------     ------------------------------       PCT level decrease by               PCT < 0.25 ng/mL       >= 80% from peak PCT       OR PCT 0.25 - 0.5 ng/mL          Stopping of antibiotics                                             encouraged.     Stopping of antibiotics           encouraged.    ----------------------------     ------------------------------       PCT level decrease  by              PCT >= 0.25 ng/mL       < 80% from peak PCT        AND PCT >= 0.5 ng/mL            Continuin g antibiotics                                              encouraged.       Continuing antibiotics            encouraged.    ----------------------------     ------------------------------     PCT level increase compared          PCT > 0.5 ng/mL         with peak PCT AND          PCT >= 0.5 ng/mL             Escalation of antibiotics                                          strongly encouraged.      Escalation of antibiotics        strongly encouraged.   Protime-INR     Status: Abnormal   Collection Time: 07/08/16  2:49 PM  Result Value Ref Range   Prothrombin Time 15.9 (H) 11.4 - 15.2 seconds   INR 1.26   Blood Culture (routine x 2)     Status: None   Collection Time: 07/08/16  2:49 PM  Result Value Ref Range   Specimen Description BLOOD RIGHT AC    Special Requests BOTTLES DRAWN AEROBIC AND ANAEROBIC BCAV    Culture NO GROWTH 5 DAYS    Report Status 07/13/2016 FINAL   Urinalysis, Routine w reflex microscopic     Status: Abnormal   Collection Time: 07/08/16  2:50 PM  Result Value Ref Range   Color, Urine YELLOW (  A) YELLOW   APPearance CLEAR (A) CLEAR   Specific Gravity, Urine 1.015 1.005 - 1.030   pH 6.0 5.0 - 8.0   Glucose, UA NEGATIVE NEGATIVE mg/dL   Hgb urine dipstick LARGE (A) NEGATIVE   Bilirubin Urine NEGATIVE NEGATIVE   Ketones, ur 20 (A) NEGATIVE mg/dL   Protein, ur 30 (A) NEGATIVE mg/dL   Nitrite NEGATIVE NEGATIVE   Leukocytes, UA NEGATIVE NEGATIVE   RBC / HPF TOO NUMEROUS TO COUNT 0 - 5 RBC/hpf   WBC, UA 0-5 0 - 5 WBC/hpf   Bacteria, UA NONE SEEN NONE SEEN   Squamous Epithelial / LPF 0-5 (A) NONE SEEN   Mucous PRESENT   Urine culture     Status: None   Collection Time: 07/08/16  2:50 PM  Result Value Ref Range   Specimen Description URINE, RANDOM    Special Requests NONE    Culture      NO GROWTH Performed at [facility], [address], [place], [place] [address]    Report Status 07/10/2016 FINAL   Blood Culture (routine x 2)     Status: None   Collection Time: 07/08/16  8:37 PM  Result Value Ref Range   Specimen Description BLOOD RIGHT HAND    Special Requests BOTTLES DRAWN AEROBIC AND ANAEROBIC BCLV    Culture NO GROWTH 5 DAYS    Report Status 07/13/2016 FINAL   Magnesium     Status: None   Collection Time: 07/08/16  8:37 PM  Result Value Ref Range   Magnesium 1.8 1.7 - 2.4 mg/dL  Troponin I     Status: Abnormal   Collection Time: 07/08/16  9:51 PM  Result Value Ref Range   Troponin I 0.08 (HH) <0.03 ng/mL    Comment: CRITICAL VALUE NOTED. VALUE IS CONSISTENT WITH PREVIOUSLY REPORTED/CALLED VALUE BY [facility]   Basic metabolic panel     Status: Abnormal   Collection Time: 07/09/16  6:40 AM  Result Value Ref Range   Sodium 136 135 - 145 mmol/L   Potassium 4.1 3.5 - 5.1 mmol/L   Chloride 103 101 - 111 mmol/L   CO2 26 22 - 32 mmol/L   Glucose, Bld 98 65 - 99 mg/dL   BUN 11 6 - 20 mg/dL   Creatinine, Ser 0.43 (L) 0.44 - 1.00 mg/dL   Calcium 9.0 8.9 - 10.3 mg/dL   GFR calc non Af Amer >60 >60 mL/min   GFR calc Af Amer >60 >60 mL/min    Comment: (NOTE) The eGFR has been calculated using the CKD EPI equation. This calculation has not been validated in all clinical situations. eGFR's persistently <60 mL/min signify possible Chronic Kidney Disease.    Anion gap 7 5 - 15  CBC     Status: Abnormal   Collection Time: 07/09/16  6:40 AM  Result Value Ref Range   WBC 7.0 3.6 - 11.0 K/uL   RBC 4.16 3.80 - 5.20 MIL/uL   Hemoglobin 12.0 12.0 - 16.0 g/dL   HCT 37.1 35.0 - 47.0 %   MCV 89.3 80.0 - 100.0 fL   MCH 28.9 26.0 - 34.0 pg   MCHC 32.4 32.0 - 36.0 g/dL   RDW 15.0 (H) 11.5 - 14.5 %   Platelets 238 150 - 440 K/uL    Radiology No results found.    Assessment/Plan  Swelling of limb Better. We'll come out of the Unna boot staying good compression stockings. Continue anticoagulation  DVT (deep venous  thrombosis) (  [place]) She will need at least another month of anticoagulation we have given her samples today. Plan a DVT study in about a month's time. She is coming out of the Unna boots and we'll need to wear compression stockings daily.  HTN (hypertension), malignant blood pressure control important in reducing the progression of atherosclerotic disease. On appropriate oral medications.   Atherosclerosis of both lower extremities with bilateral ulceration of midfeet ([place]) Still needs her arterial perfusion assessed. Plan to do this in a month or so. Significantly improved symptomatically.    Leotis Pain, MD  08/24/2016 4:43 PM    This note was created with Dragon medical transcription system.  Any errors from dictation are purely unintentional

## 2016-08-24 NOTE — Assessment & Plan Note (Signed)
Still needs her arterial perfusion assessed. Plan to do this in a month or so. Significantly improved symptomatically.

## 2016-08-24 NOTE — Assessment & Plan Note (Signed)
blood pressure control important in reducing the progression of atherosclerotic disease. On appropriate oral medications.  

## 2016-08-24 NOTE — Assessment & Plan Note (Signed)
Better. We'll come out of the Unna boot staying good compression stockings. Continue anticoagulation

## 2016-08-24 NOTE — Assessment & Plan Note (Signed)
She will need at least another month of anticoagulation we have given her samples today. Plan a DVT study in about a month's time. She is coming out of the Unna boots and we'll need to wear compression stockings daily.

## 2016-09-10 ENCOUNTER — Other Ambulatory Visit: Payer: Self-pay

## 2016-09-10 MED ORDER — CLOPIDOGREL BISULFATE 75 MG PO TABS: 75.0000 mg | ORAL_TABLET | Freq: Every day | ORAL | 0 refills | Status: DC

## 2016-09-16 ENCOUNTER — Encounter (INDEPENDENT_AMBULATORY_CARE_PROVIDER_SITE_OTHER): Payer: Self-pay | Admitting: Vascular Surgery

## 2016-09-16 ENCOUNTER — Ambulatory Visit (INDEPENDENT_AMBULATORY_CARE_PROVIDER_SITE_OTHER): Payer: Medicare Other

## 2016-09-16 ENCOUNTER — Ambulatory Visit (INDEPENDENT_AMBULATORY_CARE_PROVIDER_SITE_OTHER): Payer: Medicare Other | Admitting: Vascular Surgery

## 2016-09-16 VITALS — BP 208/92 | HR 96 | Resp 16 | Ht 67.0 in | Wt 156.0 lb

## 2016-09-16 DIAGNOSIS — I70234 Atherosclerosis of native arteries of right leg with ulceration of heel and midfoot: Secondary | ICD-10-CM | POA: Diagnosis not present

## 2016-09-16 DIAGNOSIS — I82413 Acute embolism and thrombosis of femoral vein, bilateral: Secondary | ICD-10-CM

## 2016-09-16 DIAGNOSIS — I2510 Atherosclerotic heart disease of native coronary artery without angina pectoris: Secondary | ICD-10-CM | POA: Diagnosis not present

## 2016-09-16 DIAGNOSIS — I872 Venous insufficiency (chronic) (peripheral): Secondary | ICD-10-CM | POA: Diagnosis not present

## 2016-09-16 DIAGNOSIS — I70244 Atherosclerosis of native arteries of left leg with ulceration of heel and midfoot: Secondary | ICD-10-CM

## 2016-09-23 ENCOUNTER — Ambulatory Visit (INDEPENDENT_AMBULATORY_CARE_PROVIDER_SITE_OTHER): Payer: Medicare Other | Admitting: Vascular Surgery

## 2016-09-23 ENCOUNTER — Encounter (INDEPENDENT_AMBULATORY_CARE_PROVIDER_SITE_OTHER): Payer: Self-pay

## 2016-09-23 VITALS — BP 148/92 | HR 62 | Resp 16 | Ht 67.0 in | Wt 155.0 lb

## 2016-09-23 DIAGNOSIS — I872 Venous insufficiency (chronic) (peripheral): Secondary | ICD-10-CM

## 2016-09-23 DIAGNOSIS — I2510 Atherosclerotic heart disease of native coronary artery without angina pectoris: Secondary | ICD-10-CM

## 2016-09-23 NOTE — Progress Notes (Signed)
History of Present Illness  There is no documented history at this time  Assessments & Plan   There are no diagnoses linked to this encounter.    Additional instructions  Subjective:  Patient presents with venous ulcer of the Left lower extremity.    Procedure:  3 layer unna wrap was placed Left lower extremity.   Plan:   Follow up in one week.  

## 2016-09-30 ENCOUNTER — Ambulatory Visit (INDEPENDENT_AMBULATORY_CARE_PROVIDER_SITE_OTHER): Payer: Medicare Other | Admitting: Vascular Surgery

## 2016-09-30 ENCOUNTER — Encounter (INDEPENDENT_AMBULATORY_CARE_PROVIDER_SITE_OTHER): Payer: Self-pay

## 2016-09-30 VITALS — BP 197/89 | HR 59 | Resp 16 | Ht 67.0 in | Wt 153.0 lb

## 2016-09-30 DIAGNOSIS — I872 Venous insufficiency (chronic) (peripheral): Secondary | ICD-10-CM | POA: Diagnosis not present

## 2016-09-30 DIAGNOSIS — I2510 Atherosclerotic heart disease of native coronary artery without angina pectoris: Secondary | ICD-10-CM

## 2016-09-30 NOTE — Progress Notes (Signed)
History of Present Illness  There is no documented history at this time  Assessments & Plan   There are no diagnoses linked to this encounter.    Additional instructions  Subjective:  Patient presents with venous ulcer of the Left lower extremity.    Procedure:  3 layer unna wrap was placed Left lower extremity.   Plan:   Follow up in one week.  

## 2016-10-07 ENCOUNTER — Ambulatory Visit (INDEPENDENT_AMBULATORY_CARE_PROVIDER_SITE_OTHER): Payer: Medicare Other | Admitting: Vascular Surgery

## 2016-10-07 ENCOUNTER — Encounter (INDEPENDENT_AMBULATORY_CARE_PROVIDER_SITE_OTHER): Payer: Self-pay | Admitting: Vascular Surgery

## 2016-10-07 VITALS — BP 215/82 | HR 80 | Resp 16 | Ht 66.0 in | Wt 154.0 lb

## 2016-10-07 DIAGNOSIS — R600 Localized edema: Secondary | ICD-10-CM | POA: Diagnosis not present

## 2016-10-07 NOTE — Progress Notes (Signed)
History of Present Illness  There is no documented history at this time  Assessments & Plan   There are no diagnoses linked to this encounter.    Additional instructions  Subjective:  Patient presents with venous ulcer of the Left lower extremity.    Procedure:  3 layer unna wrap was placed Left lower extremity.   Plan:   Follow up in one week.  

## 2016-10-08 DIAGNOSIS — R600 Localized edema: Secondary | ICD-10-CM | POA: Insufficient documentation

## 2016-10-14 ENCOUNTER — Ambulatory Visit (INDEPENDENT_AMBULATORY_CARE_PROVIDER_SITE_OTHER): Payer: Medicare Other | Admitting: Vascular Surgery

## 2016-10-14 ENCOUNTER — Telehealth: Payer: Self-pay

## 2016-10-14 ENCOUNTER — Encounter (INDEPENDENT_AMBULATORY_CARE_PROVIDER_SITE_OTHER): Payer: Self-pay

## 2016-10-14 ENCOUNTER — Other Ambulatory Visit: Payer: Self-pay

## 2016-10-14 VITALS — BP 184/87 | HR 67 | Resp 16 | Wt 150.8 lb

## 2016-10-14 DIAGNOSIS — I2510 Atherosclerotic heart disease of native coronary artery without angina pectoris: Secondary | ICD-10-CM

## 2016-10-14 DIAGNOSIS — I83003 Varicose veins of unspecified lower extremity with ulcer of ankle: Secondary | ICD-10-CM

## 2016-10-14 DIAGNOSIS — L97309 Non-pressure chronic ulcer of unspecified ankle with unspecified severity: Secondary | ICD-10-CM | POA: Diagnosis not present

## 2016-10-14 NOTE — Telephone Encounter (Signed)
Pt called wanting her "blood pressure medicine refilled at the [facility]"- I called [facility] because we were unsure of what she was taking. We had 3 listed. Dr Saralyn Pilar wrote for Metoprolol and she has 1 refill on file. I told them to fill it but to send refill requests to [facility]. Also advised pt to contact Dr Saralyn Pilar' office

## 2016-10-14 NOTE — Progress Notes (Signed)
History of Present Illness  There is no documented history at this time  Assessments & Plan   There are no diagnoses linked to this encounter.    Additional instructions  Subjective:  Patient presents with venous ulcer of the Left lower extremity.    Procedure:  3 layer unna wrap was placed Left lower extremity.   Plan:   Follow up in one week.  

## 2016-10-21 ENCOUNTER — Ambulatory Visit (INDEPENDENT_AMBULATORY_CARE_PROVIDER_SITE_OTHER): Payer: Medicare Other | Admitting: Vascular Surgery

## 2016-10-21 DIAGNOSIS — R600 Localized edema: Secondary | ICD-10-CM

## 2016-10-21 DIAGNOSIS — I872 Venous insufficiency (chronic) (peripheral): Secondary | ICD-10-CM | POA: Diagnosis not present

## 2016-10-21 DIAGNOSIS — I82413 Acute embolism and thrombosis of femoral vein, bilateral: Secondary | ICD-10-CM

## 2016-10-21 NOTE — Progress Notes (Signed)
Subjective:    Patient ID: Melissa Cochran, female    DOB: 05/28/1935, 81 y.o.   MRN: [id number] Chief Complaint  Patient presents with  . Re-evaluation    Unna check   Patient presents for lower extremity edema and wound assessment. The patient has been in a left lower extremity Unna boot for many weeks. Patient presents today asking for Eliquis samples again. Patient states an improvement in her bilateral lower extremity edema. She denies any fever, nausea or vomiting.    Review of Systems  Constitutional: Negative.   HENT: Negative.   Eyes: Negative.   Respiratory: Negative.   Cardiovascular: Negative.   Gastrointestinal: Negative.   Endocrine: Negative.   Genitourinary: Negative.   Musculoskeletal: Negative.   Skin: Negative.   Allergic/Immunologic: Negative.   Neurological: Negative.   Hematological: Negative.   Psychiatric/Behavioral: Negative.       Objective:   Physical Exam  Constitutional: She is oriented to person, place, and time. She appears well-developed and well-nourished. No distress.  HENT:  Head: Normocephalic and atraumatic.  Eyes: Conjunctivae are normal. Pupils are equal, round, and reactive to light.  Neck: Normal range of motion.  Cardiovascular: Normal rate, regular rhythm, normal heart sounds and intact distal pulses.   Pulses:      Radial pulses are 2+ on the right side, and 2+ on the left side.       Dorsalis pedis pulses are 1+ on the right side, and 1+ on the left side.       Posterior tibial pulses are 1+ on the right side, and 1+ on the left side.  Pulmonary/Chest: Effort normal.  Musculoskeletal: Normal range of motion. She exhibits no edema (Left lower extremity edema has greatly improved.).  Neurological: She is alert and oriented to person, place, and time.  Skin: Skin is warm and dry. She is not diaphoretic.  Skin ulcerations to the left lower extremity are now healed.  Psychiatric: She has a normal mood and affect. Her behavior  is normal. Judgment and thought content normal.  Vitals reviewed.   There were no vitals taken for this visit.  Past Medical History:  Diagnosis Date  . Adnexal mass   . Anxiety   . Arthritis   . Bladder tumor   . Bleeding ulcer   . CHF (congestive heart failure) ([place])   . CHF (congestive heart failure) ([place])   . COPD (chronic obstructive pulmonary disease) ([place])   . Coronary artery disease   . GERD (gastroesophageal reflux disease)   . Heart block    left  . History of bleeding ulcers   . Hyperlipemia   . Hypertension   . Pneumonia   . Shortness of breath dyspnea     Social History   Social History  . Marital status: Widowed    Spouse name: N/A  . Number of children: N/A  . Years of education: N/A   Occupational History  . Not on file.   Social History Main Topics  . Smoking status: Former Smoker    Packs/day: 1.00    Years: 40.00    Quit date: 06/13/1998  . Smokeless tobacco: Never Used  . Alcohol use No  . Drug use: No  . Sexual activity: Not Currently   Other Topics Concern  . Not on file   Social History Narrative  . No narrative on file    Past Surgical History:  Procedure Laterality Date  . ABDOMINAL HYSTERECTOMY    . CARDIAC CATHETERIZATION Left  10/21/2015   Procedure: Left Heart Cath and Coronary Angiography;  Surgeon: Isaias Cowman, MD;  Location: [place] [facility];  Service: Cardiovascular;  Laterality: Left;  . CARDIAC CATHETERIZATION N/A 10/21/2015   Procedure: Coronary Stent Intervention;  Surgeon: Isaias Cowman, MD;  Location: [place] [facility];  Service: Cardiovascular;  Laterality: N/A;  . CARDIAC SURGERY     cardiac cath  . CYSTOSCOPY W/ RETROGRADES Bilateral 01/20/2016   Procedure: CYSTOSCOPY WITH RETROGRADE PYELOGRAM;  Surgeon: Hollice Espy, MD;  Location: [facility];  Service: Urology;  Laterality: Bilateral;  . CYSTOSCOPY WITH STENT PLACEMENT Right 01/20/2016   Procedure: CYSTOSCOPY WITH STENT PLACEMENT;  Surgeon:  Hollice Espy, MD;  Location: [facility];  Service: Urology;  Laterality: Right;  . HIP SURGERY     left  . STOMACH SURGERY    . TRANSURETHRAL RESECTION OF BLADDER TUMOR N/A 01/20/2016   Procedure: TRANSURETHRAL RESECTION OF BLADDER TUMOR (TURBT);  Surgeon: Hollice Espy, MD;  Location: [facility];  Service: Urology;  Laterality: N/A;    Family History  Problem Relation Age of Onset  . Stomach cancer Mother   . CVA Father   . Bladder Cancer Neg Hx   . Kidney cancer Neg Hx     No Known Allergies     Assessment & Plan:  Patient presents for lower extremity edema and wound assessment. The patient has been in a left lower extremity Unna boot for many weeks. Patient presents today asking for Eliquis samples again. Patient states an improvement in her bilateral lower extremity edema. She denies any fever, nausea or vomiting.   1. Venous insufficiency of both lower extremities - Stable Patient with venous insufficiency of the lower extremity. Patient will need to wear medically one compression on daily basis and elevate her legs her level or higher as a conservative therapy. The patient has been in the left lower extremity Unna boot for edema exacerbation with ulcer formation. Her edema is markedly improved and her ulcerations have healed. We'll continue on her wraps to the lower extremity and transition over to medical grade compression stockings. Patient states she has socks. She knows to call the office if her lower extremity edema returns  2. Acute deep vein thrombosis (DVT) of femoral vein of both lower extremities (HCC) - stable Patient with marked improvement to the left lower extremity. We'll bring patient back for a DVT study  If no DVT or chronic will transition off Eliquis. I suspect the patient has not been taking Eliquis on a consistent basis.  I gave the patient another week of Eliquis.  3. Bilateral lower extremity edema - improved Lower extremity edema especially the left  leg has improved after weeks of left lower extremity under wraps.  Current Outpatient Prescriptions on File Prior to Visit  Medication Sig Dispense Refill  . acetaminophen (TYLENOL) 325 MG tablet Take 650 mg by mouth every 6 (six) hours as needed for mild pain or headache.     Marland Kitchen apixaban (ELIQUIS) 5 MG TABS tablet Take 5 mg by mouth 2 (two) times daily.    Marland Kitchen aspirin EC 81 MG tablet Take by mouth.    . clopidogrel (PLAVIX) 75 MG tablet Take 1 tablet (75 mg total) by mouth daily. 30 tablet 0  . docusate sodium (COLACE) 100 MG capsule Take 1 capsule (100 mg total) by mouth 2 (two) times daily. 60 capsule 0  . metoprolol (LOPRESSOR) 100 MG tablet Take 1 tablet (100 mg total) by mouth 2 (two) times daily. [place]  tablet 0  . traMADol (ULTRAM) 50 MG tablet Take 100 mg by mouth every 6 (six) hours as needed.     Current Facility-Administered Medications on File Prior to Visit  Medication Dose Route Frequency Provider Last Rate Last Dose  . apixaban (ELIQUIS) tablet 5 mg  5 mg Oral BID Algernon Huxley, MD      . traMADol Veatrice Bourbon) tablet 50 mg  50 mg Oral Q6H PRN Algernon Huxley, MD        There are no Patient Instructions on file for this visit. No Follow-up on file.   Coyle Stordahl A Irish Piech, PA-C

## 2016-10-26 NOTE — Progress Notes (Signed)
Subjective:    Patient ID: Melissa Cochran, female    DOB: 05/25/1935, 81 y.o.   MRN: [id number] Chief Complaint  Patient presents with  . Re-evaluation    Ultrasound follow up   Patient last seen on 08/18/2016 in follow-up for bilateral lower extremity DVT and subsequent edema with ulceration. Patient has been undergoing bilateral lower extremity Unna wraps with minimal improvement. Daughter and the patient continue to ask for Eliquis samples. They have been given the information to seek monetary assistance for the Eliquis. If this is not feasible, we have recommended transitioning to Coumadin. The patient underwent a bilateral lower extremity ABI which was notable for no significant lower extremity arterial disease. This is no significant change when compared to the previous exam. The patient underwent a bilateral lower venous duplex exam which was notable for evidence of a minimally to partially occlusive chronic thrombus in the right distal from oral vein, chronic thrombus in the right knee level small saphenous vein, no evidence of deep or superficial vein thrombosis noted in the remainder of the visualized bilateral lower extremity veins, previous exam on 06/24/2016 demonstrated partially occlusive acute to subacute thrombus in the bilateral femoral veins and a chronic right small saphenous vein. Patient denies any fever, nausea, shortness of breath, chest pain.   Review of Systems  Constitutional: Negative.   HENT: Negative.   Eyes: Negative.   Respiratory: Negative.   Cardiovascular: Positive for leg swelling.  Gastrointestinal: Negative.   Endocrine: Negative.   Genitourinary: Negative.   Musculoskeletal: Negative.   Skin: Negative.   Allergic/Immunologic: Negative.   Neurological: Negative.   Hematological: Negative.   Psychiatric/Behavioral: Negative.       Objective:   Physical Exam  Constitutional: She is oriented to person, place, and time. She appears  well-developed and well-nourished. No distress.  HENT:  Head: Normocephalic and atraumatic.  Eyes: Conjunctivae are normal. Pupils are equal, round, and reactive to light.  Neck: Normal range of motion.  Cardiovascular: Normal rate, regular rhythm, normal heart sounds and intact distal pulses.   Pulses:      Radial pulses are 2+ on the right side, and 2+ on the left side.  Unable to palpate pedal pulses due to body habitus and edema however bilateral feet are warm  Pulmonary/Chest: Effort normal.  Musculoskeletal: Normal range of motion. She exhibits edema (Moderate bilateral lower extremity edema).  Neurological: She is alert and oriented to person, place, and time.  Skin: She is not diaphoretic.  Scattered ulcerations are noted however these are healing.  Psychiatric: She has a normal mood and affect. Her behavior is normal. Judgment and thought content normal.  Vitals reviewed.  BP (!) 208/92 (BP Location: Left Arm)   Pulse 96   Resp 16   Ht 5\' 7"  (1.702 m)   Wt 156 lb (70.8 kg)   BMI 24.43 kg/m   Past Medical History:  Diagnosis Date  . Adnexal mass   . Anxiety   . Arthritis   . Bladder tumor   . Bleeding ulcer   . CHF (congestive heart failure) ([place])   . CHF (congestive heart failure) ([place])   . COPD (chronic obstructive pulmonary disease) ([place])   . Coronary artery disease   . GERD (gastroesophageal reflux disease)   . Heart block    left  . History of bleeding ulcers   . Hyperlipemia   . Hypertension   . Pneumonia   . Shortness of breath dyspnea     Social History  Social History  . Marital status: Widowed    Spouse name: N/A  . Number of children: N/A  . Years of education: N/A   Occupational History  . Not on file.   Social History Main Topics  . Smoking status: Former Smoker    Packs/day: 1.00    Years: 40.00    Quit date: 06/13/1998  . Smokeless tobacco: Never Used  . Alcohol use No  . Drug use: No  . Sexual activity: Not Currently   Other  Topics Concern  . Not on file   Social History Narrative  . No narrative on file    Past Surgical History:  Procedure Laterality Date  . ABDOMINAL HYSTERECTOMY    . CARDIAC CATHETERIZATION Left 10/21/2015   Procedure: Left Heart Cath and Coronary Angiography;  Surgeon: Isaias Cowman, MD;  Location: [place] [facility];  Service: Cardiovascular;  Laterality: Left;  . CARDIAC CATHETERIZATION N/A 10/21/2015   Procedure: Coronary Stent Intervention;  Surgeon: Isaias Cowman, MD;  Location: [place] [facility];  Service: Cardiovascular;  Laterality: N/A;  . CARDIAC SURGERY     cardiac cath  . CYSTOSCOPY W/ RETROGRADES Bilateral 01/20/2016   Procedure: CYSTOSCOPY WITH RETROGRADE PYELOGRAM;  Surgeon: Hollice Espy, MD;  Location: [facility];  Service: Urology;  Laterality: Bilateral;  . CYSTOSCOPY WITH STENT PLACEMENT Right 01/20/2016   Procedure: CYSTOSCOPY WITH STENT PLACEMENT;  Surgeon: Hollice Espy, MD;  Location: [facility];  Service: Urology;  Laterality: Right;  . HIP SURGERY     left  . STOMACH SURGERY    . TRANSURETHRAL RESECTION OF BLADDER TUMOR N/A 01/20/2016   Procedure: TRANSURETHRAL RESECTION OF BLADDER TUMOR (TURBT);  Surgeon: Hollice Espy, MD;  Location: [facility];  Service: Urology;  Laterality: N/A;    Family History  Problem Relation Age of Onset  . Stomach cancer Mother   . CVA Father   . Bladder Cancer Neg Hx   . Kidney cancer Neg Hx     No Known Allergies     Assessment & Plan:  Patient last seen on 08/18/2016 in follow-up for bilateral lower extremity DVT and subsequent edema with ulceration. Patient has been undergoing bilateral lower extremity Unna wraps with minimal improvement. Daughter and the patient continue to ask for Eliquis samples. They have been given the information to seek monetary assistance for the Eliquis. If this is not feasible, we have recommended transitioning to Coumadin. The patient underwent a bilateral lower extremity ABI which  was notable for no significant lower extremity arterial disease. This is no significant change when compared to the previous exam. The patient underwent a bilateral lower venous duplex exam which was notable for evidence of a minimally to partially occlusive chronic thrombus in the right distal from oral vein, chronic thrombus in the right knee level small saphenous vein, no evidence of deep or superficial vein thrombosis noted in the remainder of the visualized bilateral lower extremity veins, previous exam on 06/24/2016 demonstrated partially occlusive acute to subacute thrombus in the bilateral femoral veins and a chronic right small saphenous vein. Patient denies any fever, nausea, shortness of breath, chest pain.  1. Venous insufficiency of both lower extremities - stable Patient is currently being treated with bilateral Unna wraps. Once her edema is controlled and her ulcerations are healed we'll transition the patient to compression stockings. Continue bilateral lower extremity Unna wraps for now. I suspect the patient has not engaging in appropriate elevation - we reviewed this again.  2. Acute deep vein thrombosis (DVT)  of femoral vein of both lower extremities (HCC) - stable As above Patient given Eliquis samples. Reminded the daughter and the patient to please call about receiving financial assistance. They expressed their understanding and state they will do so  Current Outpatient Prescriptions on File Prior to Visit  Medication Sig Dispense Refill  . acetaminophen (TYLENOL) 325 MG tablet Take 650 mg by mouth every 6 (six) hours as needed for mild pain or headache.     Marland Kitchen apixaban (ELIQUIS) 5 MG TABS tablet Take 5 mg by mouth 2 (two) times daily.    Marland Kitchen aspirin EC 81 MG tablet Take by mouth.    . clopidogrel (PLAVIX) 75 MG tablet Take 1 tablet (75 mg total) by mouth daily. 30 tablet 0  . docusate sodium (COLACE) 100 MG capsule Take 1 capsule (100 mg total) by mouth 2 (two) times daily.  60 capsule 0  . metoprolol (LOPRESSOR) 100 MG tablet Take 1 tablet (100 mg total) by mouth 2 (two) times daily. 60 tablet 0  . traMADol (ULTRAM) 50 MG tablet Take 100 mg by mouth every 6 (six) hours as needed.     Current Facility-Administered Medications on File Prior to Visit  Medication Dose Route Frequency Provider Last Rate Last Dose  . apixaban (ELIQUIS) tablet 5 mg  5 mg Oral BID Algernon Huxley, MD      . traMADol Veatrice Bourbon) tablet 50 mg  50 mg Oral Q6H PRN Algernon Huxley, MD        There are no Patient Instructions on file for this visit. No Follow-up on file.   Breyonna Nault A Knight [place], PA-C

## 2016-11-11 ENCOUNTER — Other Ambulatory Visit: Payer: Self-pay | Admitting: Family Medicine

## 2016-11-17 ENCOUNTER — Other Ambulatory Visit (INDEPENDENT_AMBULATORY_CARE_PROVIDER_SITE_OTHER): Payer: Self-pay | Admitting: Vascular Surgery

## 2016-11-17 ENCOUNTER — Other Ambulatory Visit (INDEPENDENT_AMBULATORY_CARE_PROVIDER_SITE_OTHER): Payer: Medicare Other

## 2016-11-17 DIAGNOSIS — R600 Localized edema: Secondary | ICD-10-CM

## 2016-11-17 DIAGNOSIS — Z86718 Personal history of other venous thrombosis and embolism: Secondary | ICD-10-CM

## 2016-11-26 ENCOUNTER — Encounter (INDEPENDENT_AMBULATORY_CARE_PROVIDER_SITE_OTHER): Payer: Self-pay | Admitting: Vascular Surgery

## 2016-12-19 ENCOUNTER — Other Ambulatory Visit: Payer: Self-pay | Admitting: Family Medicine

## 2017-01-07 ENCOUNTER — Other Ambulatory Visit: Payer: Self-pay | Admitting: Family Medicine

## 2017-02-13 ENCOUNTER — Emergency Department: Payer: Medicare Other

## 2017-02-13 ENCOUNTER — Inpatient Hospital Stay
Admission: EM | Admit: 2017-02-13 | Discharge: 2017-02-22 | DRG: 870 | Disposition: A | Discharge status: Other Acute Inpatient Hospital | Payer: Medicare Other | Attending: Internal Medicine | Admitting: Internal Medicine

## 2017-02-13 ENCOUNTER — Encounter: Payer: Self-pay | Admitting: Emergency Medicine

## 2017-02-13 DIAGNOSIS — G0490 Encephalitis and encephalomyelitis, unspecified: Secondary | ICD-10-CM | POA: Diagnosis not present

## 2017-02-13 DIAGNOSIS — L89159 Pressure ulcer of sacral region, unspecified stage: Secondary | ICD-10-CM | POA: Diagnosis present

## 2017-02-13 DIAGNOSIS — M48061 Spinal stenosis, lumbar region without neurogenic claudication: Secondary | ICD-10-CM | POA: Diagnosis not present

## 2017-02-13 DIAGNOSIS — K219 Gastro-esophageal reflux disease without esophagitis: Secondary | ICD-10-CM | POA: Diagnosis present

## 2017-02-13 DIAGNOSIS — Z955 Presence of coronary angioplasty implant and graft: Secondary | ICD-10-CM

## 2017-02-13 DIAGNOSIS — B9620 Unspecified Escherichia coli [E. coli] as the cause of diseases classified elsewhere: Secondary | ICD-10-CM | POA: Diagnosis present

## 2017-02-13 DIAGNOSIS — I480 Paroxysmal atrial fibrillation: Secondary | ICD-10-CM | POA: Diagnosis present

## 2017-02-13 DIAGNOSIS — Z4682 Encounter for fitting and adjustment of non-vascular catheter: Secondary | ICD-10-CM | POA: Diagnosis not present

## 2017-02-13 DIAGNOSIS — R402433 Glasgow coma scale score 3-8, at hospital admission: Secondary | ICD-10-CM | POA: Diagnosis not present

## 2017-02-13 DIAGNOSIS — D649 Anemia, unspecified: Secondary | ICD-10-CM | POA: Diagnosis present

## 2017-02-13 DIAGNOSIS — I110 Hypertensive heart disease with heart failure: Secondary | ICD-10-CM | POA: Diagnosis present

## 2017-02-13 DIAGNOSIS — L8994 Pressure ulcer of unspecified site, stage 4: Secondary | ICD-10-CM

## 2017-02-13 DIAGNOSIS — I5032 Chronic diastolic (congestive) heart failure: Secondary | ICD-10-CM | POA: Diagnosis present

## 2017-02-13 DIAGNOSIS — Z7902 Long term (current) use of antithrombotics/antiplatelets: Secondary | ICD-10-CM | POA: Diagnosis not present

## 2017-02-13 DIAGNOSIS — Z515 Encounter for palliative care: Secondary | ICD-10-CM

## 2017-02-13 DIAGNOSIS — M47816 Spondylosis without myelopathy or radiculopathy, lumbar region: Secondary | ICD-10-CM | POA: Diagnosis not present

## 2017-02-13 DIAGNOSIS — J9690 Respiratory failure, unspecified, unspecified whether with hypoxia or hypercapnia: Secondary | ICD-10-CM

## 2017-02-13 DIAGNOSIS — N179 Acute kidney failure, unspecified: Secondary | ICD-10-CM | POA: Diagnosis not present

## 2017-02-13 DIAGNOSIS — N170 Acute kidney failure with tubular necrosis: Secondary | ICD-10-CM | POA: Diagnosis not present

## 2017-02-13 DIAGNOSIS — G40901 Epilepsy, unspecified, not intractable, with status epilepticus: Secondary | ICD-10-CM | POA: Diagnosis not present

## 2017-02-13 DIAGNOSIS — B004 Herpesviral encephalitis: Secondary | ICD-10-CM | POA: Diagnosis present

## 2017-02-13 DIAGNOSIS — Z452 Encounter for adjustment and management of vascular access device: Secondary | ICD-10-CM | POA: Diagnosis not present

## 2017-02-13 DIAGNOSIS — Z823 Family history of stroke: Secondary | ICD-10-CM

## 2017-02-13 DIAGNOSIS — Z7901 Long term (current) use of anticoagulants: Secondary | ICD-10-CM

## 2017-02-13 DIAGNOSIS — A419 Sepsis, unspecified organism: Principal | ICD-10-CM | POA: Diagnosis present

## 2017-02-13 DIAGNOSIS — R4020 Unspecified coma: Secondary | ICD-10-CM | POA: Diagnosis not present

## 2017-02-13 DIAGNOSIS — R319 Hematuria, unspecified: Secondary | ICD-10-CM | POA: Diagnosis not present

## 2017-02-13 DIAGNOSIS — R7989 Other specified abnormal findings of blood chemistry: Secondary | ICD-10-CM

## 2017-02-13 DIAGNOSIS — J9600 Acute respiratory failure, unspecified whether with hypoxia or hypercapnia: Secondary | ICD-10-CM | POA: Diagnosis not present

## 2017-02-13 DIAGNOSIS — E860 Dehydration: Secondary | ICD-10-CM | POA: Diagnosis present

## 2017-02-13 DIAGNOSIS — G92 Toxic encephalopathy: Secondary | ICD-10-CM | POA: Diagnosis present

## 2017-02-13 DIAGNOSIS — R6520 Severe sepsis without septic shock: Secondary | ICD-10-CM | POA: Diagnosis present

## 2017-02-13 DIAGNOSIS — E872 Acidosis: Secondary | ICD-10-CM | POA: Diagnosis present

## 2017-02-13 DIAGNOSIS — L8990 Pressure ulcer of unspecified site, unspecified stage: Secondary | ICD-10-CM | POA: Insufficient documentation

## 2017-02-13 DIAGNOSIS — G9341 Metabolic encephalopathy: Secondary | ICD-10-CM | POA: Diagnosis not present

## 2017-02-13 DIAGNOSIS — R809 Proteinuria, unspecified: Secondary | ICD-10-CM | POA: Diagnosis not present

## 2017-02-13 DIAGNOSIS — R739 Hyperglycemia, unspecified: Secondary | ICD-10-CM | POA: Diagnosis not present

## 2017-02-13 DIAGNOSIS — Z87891 Personal history of nicotine dependence: Secondary | ICD-10-CM | POA: Diagnosis not present

## 2017-02-13 DIAGNOSIS — R0600 Dyspnea, unspecified: Secondary | ICD-10-CM | POA: Diagnosis not present

## 2017-02-13 DIAGNOSIS — N201 Calculus of ureter: Secondary | ICD-10-CM | POA: Diagnosis not present

## 2017-02-13 DIAGNOSIS — R778 Other specified abnormalities of plasma proteins: Secondary | ICD-10-CM

## 2017-02-13 DIAGNOSIS — R748 Abnormal levels of other serum enzymes: Secondary | ICD-10-CM | POA: Diagnosis not present

## 2017-02-13 DIAGNOSIS — I499 Cardiac arrhythmia, unspecified: Secondary | ICD-10-CM | POA: Diagnosis not present

## 2017-02-13 DIAGNOSIS — J449 Chronic obstructive pulmonary disease, unspecified: Secondary | ICD-10-CM | POA: Diagnosis present

## 2017-02-13 DIAGNOSIS — Z90710 Acquired absence of both cervix and uterus: Secondary | ICD-10-CM | POA: Diagnosis not present

## 2017-02-13 DIAGNOSIS — N136 Pyonephrosis: Secondary | ICD-10-CM | POA: Diagnosis present

## 2017-02-13 DIAGNOSIS — E785 Hyperlipidemia, unspecified: Secondary | ICD-10-CM | POA: Diagnosis present

## 2017-02-13 DIAGNOSIS — I639 Cerebral infarction, unspecified: Secondary | ICD-10-CM | POA: Diagnosis not present

## 2017-02-13 DIAGNOSIS — I2510 Atherosclerotic heart disease of native coronary artery without angina pectoris: Secondary | ICD-10-CM | POA: Diagnosis present

## 2017-02-13 DIAGNOSIS — R0902 Hypoxemia: Secondary | ICD-10-CM | POA: Diagnosis not present

## 2017-02-13 DIAGNOSIS — I739 Peripheral vascular disease, unspecified: Secondary | ICD-10-CM | POA: Diagnosis present

## 2017-02-13 DIAGNOSIS — J9601 Acute respiratory failure with hypoxia: Secondary | ICD-10-CM | POA: Diagnosis not present

## 2017-02-13 DIAGNOSIS — Z1612 Extended spectrum beta lactamase (ESBL) resistance: Secondary | ICD-10-CM | POA: Diagnosis present

## 2017-02-13 DIAGNOSIS — G40401 Other generalized epilepsy and epileptic syndromes, not intractable, with status epilepticus: Secondary | ICD-10-CM | POA: Diagnosis present

## 2017-02-13 DIAGNOSIS — G030 Nonpyogenic meningitis: Secondary | ICD-10-CM | POA: Diagnosis not present

## 2017-02-13 DIAGNOSIS — N132 Hydronephrosis with renal and ureteral calculous obstruction: Secondary | ICD-10-CM | POA: Diagnosis not present

## 2017-02-13 DIAGNOSIS — R569 Unspecified convulsions: Secondary | ICD-10-CM | POA: Diagnosis not present

## 2017-02-13 DIAGNOSIS — R9431 Abnormal electrocardiogram [ECG] [EKG]: Secondary | ICD-10-CM | POA: Diagnosis not present

## 2017-02-13 DIAGNOSIS — G9340 Encephalopathy, unspecified: Secondary | ICD-10-CM | POA: Diagnosis not present

## 2017-02-13 DIAGNOSIS — N390 Urinary tract infection, site not specified: Secondary | ICD-10-CM | POA: Diagnosis not present

## 2017-02-13 DIAGNOSIS — R000 Tachycardia, unspecified: Secondary | ICD-10-CM | POA: Diagnosis not present

## 2017-02-13 LAB — CBC WITH DIFFERENTIAL/PLATELET
BASOS ABS: 0 10*3/uL (ref 0–0.1)
Basophils Relative: 0 %
EOS PCT: 0 %
Eosinophils Absolute: 0 10*3/uL (ref 0–0.7)
HEMATOCRIT: 34.5 % — AB (ref 35.0–47.0)
Hemoglobin: 11.2 g/dL — ABNORMAL LOW (ref 12.0–16.0)
LYMPHS ABS: 0.3 10*3/uL — AB (ref 1.0–3.6)
LYMPHS PCT: 2 %
MCH: 29.5 pg (ref 26.0–34.0)
MCHC: 32.3 g/dL (ref 32.0–36.0)
MCV: 91.3 fL (ref 80.0–100.0)
MONO ABS: 0.5 10*3/uL (ref 0.2–0.9)
Monocytes Relative: 4 %
NEUTROS ABS: 11.8 10*3/uL — AB (ref 1.4–6.5)
Neutrophils Relative %: 94 %
PLATELETS: 363 10*3/uL (ref 150–440)
RBC: 3.78 MIL/uL — AB (ref 3.80–5.20)
RDW: 15.4 % — AB (ref 11.5–14.5)
WBC: 12.6 10*3/uL — ABNORMAL HIGH (ref 3.6–11.0)

## 2017-02-13 LAB — BLOOD GAS, VENOUS
ACID-BASE DEFICIT: 3.2 mmol/L — AB (ref 0.0–2.0)
BICARBONATE: 23.2 mmol/L (ref 20.0–28.0)
FIO2: 0.6
Hi Frequency JET Vent Rate: 14
O2 Saturation: 97.4 %
PATIENT TEMPERATURE: 39.4
PEEP/CPAP: 5 cmH2O
PH VEN: 7.28 (ref 7.250–7.430)
RATE: 16 resp/min
VT: 500 mL
pCO2, Ven: 46 mmHg (ref 44.0–60.0)
pO2, Ven: 119 mmHg — ABNORMAL HIGH (ref 32.0–45.0)

## 2017-02-13 LAB — COMPREHENSIVE METABOLIC PANEL
ALBUMIN: 2.7 g/dL — AB (ref 3.5–5.0)
ALT: 13 U/L — AB (ref 14–54)
AST: 72 U/L — AB (ref 15–41)
Alkaline Phosphatase: 120 U/L (ref 38–126)
Anion gap: 11 (ref 5–15)
BUN: 18 mg/dL (ref 6–20)
CHLORIDE: 108 mmol/L (ref 101–111)
CO2: 22 mmol/L (ref 22–32)
CREATININE: 1.03 mg/dL — AB (ref 0.44–1.00)
Calcium: 8.5 mg/dL — ABNORMAL LOW (ref 8.9–10.3)
GFR calc Af Amer: 58 mL/min — ABNORMAL LOW (ref >60)
GFR calc non Af Amer: 50 mL/min — ABNORMAL LOW (ref >60)
Glucose, Bld: 91 mg/dL (ref 65–99)
POTASSIUM: 4.8 mmol/L (ref 3.5–5.1)
SODIUM: 141 mmol/L (ref 135–145)
Total Bilirubin: 0.5 mg/dL (ref 0.3–1.2)
Total Protein: 5.8 g/dL — ABNORMAL LOW (ref 6.5–8.1)

## 2017-02-13 LAB — PROCALCITONIN: PROCALCITONIN: 1.97 ng/mL

## 2017-02-13 LAB — TYPE AND SCREEN
ABO/RH(D): A NEG
ANTIBODY SCREEN: NEGATIVE

## 2017-02-13 LAB — URINALYSIS, COMPLETE (UACMP) WITH MICROSCOPIC
Bilirubin Urine: NEGATIVE
Glucose, UA: 150 mg/dL — AB
Ketones, ur: NEGATIVE mg/dL
Nitrite: NEGATIVE
PROTEIN: 100 mg/dL — AB
SPECIFIC GRAVITY, URINE: 1.012 (ref 1.005–1.030)
pH: 5 (ref 5.0–8.0)

## 2017-02-13 LAB — PROTIME-INR
INR: 1.26
PROTHROMBIN TIME: 15.7 s — AB (ref 11.4–15.2)

## 2017-02-13 LAB — GLUCOSE, CAPILLARY: GLUCOSE-CAPILLARY: 125 mg/dL — AB (ref 65–99)

## 2017-02-13 LAB — LACTIC ACID, PLASMA
LACTIC ACID, VENOUS: 5.8 mmol/L — AB (ref 0.5–1.9)
LACTIC ACID, VENOUS: 3.6 mmol/L — AB (ref 0.5–1.9)

## 2017-02-13 LAB — TROPONIN I
Troponin I: 1.95 ng/mL (ref <0.03)
Troponin I: 11.29 ng/mL (ref <0.03)

## 2017-02-13 MED ORDER — MIDAZOLAM HCL 5 MG/5ML IJ SOLN
INTRAMUSCULAR | Status: AC | PRN
  Administered 2017-02-13: 2 mg via INTRAVENOUS

## 2017-02-13 MED ORDER — SODIUM CHLORIDE 0.9 % IV SOLN
INTRAVENOUS | Status: DC
  Administered 2017-02-13 – 2017-02-14 (×2): via INTRAVENOUS

## 2017-02-13 MED ORDER — ACETAMINOPHEN 650 MG RE SUPP
650.0000 mg | Freq: Four times a day (QID) | RECTAL | Status: DC | PRN
  Administered 2017-02-15: 650 mg via RECTAL
  Filled 2017-02-13: qty 1

## 2017-02-13 MED ORDER — SODIUM CHLORIDE 0.9 % IV BOLUS (SEPSIS)
1000.0000 mL | Freq: Once | INTRAVENOUS | Status: AC
  Administered 2017-02-13: 1000 mL via INTRAVENOUS

## 2017-02-13 MED ORDER — ENOXAPARIN SODIUM 40 MG/0.4ML ~~LOC~~ SOLN
40.0000 mg | SUBCUTANEOUS | Status: DC
  Administered 2017-02-13: 40 mg via SUBCUTANEOUS
  Filled 2017-02-13: qty 0.4

## 2017-02-13 MED ORDER — ROCURONIUM BROMIDE 50 MG/5ML IV SOLN
INTRAVENOUS | Status: AC | PRN
  Administered 2017-02-13: 100 mg via INTRAVENOUS

## 2017-02-13 MED ORDER — SODIUM CHLORIDE 0.9 % IV SOLN
1500.0000 mg | Freq: Once | INTRAVENOUS | Status: AC
  Administered 2017-02-13: 1500 mg via INTRAVENOUS
  Filled 2017-02-13: qty 15

## 2017-02-13 MED ORDER — CEFTRIAXONE SODIUM 1 G IJ SOLR
1.0000 g | INTRAMUSCULAR | Status: DC
  Administered 2017-02-13: 1 g via INTRAVENOUS
  Filled 2017-02-13 (×2): qty 10

## 2017-02-13 MED ORDER — ACETAMINOPHEN 325 MG PO TABS
650.0000 mg | ORAL_TABLET | Freq: Four times a day (QID) | ORAL | Status: DC | PRN
  Administered 2017-02-14 – 2017-02-19 (×3): 650 mg via ORAL
  Filled 2017-02-13 (×3): qty 2

## 2017-02-13 MED ORDER — VANCOMYCIN HCL IN DEXTROSE 1-5 GM/200ML-% IV SOLN
1000.0000 mg | Freq: Once | INTRAVENOUS | Status: AC
  Administered 2017-02-13: 1000 mg via INTRAVENOUS
  Filled 2017-02-13: qty 200

## 2017-02-13 MED ORDER — PIPERACILLIN-TAZOBACTAM 3.375 G IVPB 30 MIN
3.3750 g | Freq: Once | INTRAVENOUS | Status: AC
  Administered 2017-02-13: 3.375 g via INTRAVENOUS

## 2017-02-13 MED ORDER — SODIUM CHLORIDE 0.9 % IV SOLN
500.0000 mg | Freq: Two times a day (BID) | INTRAVENOUS | Status: DC
  Administered 2017-02-14 (×2): 500 mg via INTRAVENOUS
  Filled 2017-02-13 (×4): qty 5

## 2017-02-13 MED ORDER — PROPOFOL 1000 MG/100ML IV EMUL
5.0000 ug/kg/min | Freq: Once | INTRAVENOUS | Status: AC
  Administered 2017-02-13: 5 ug/kg/min via INTRAVENOUS
  Filled 2017-02-13: qty 100

## 2017-02-13 MED ORDER — ETOMIDATE 2 MG/ML IV SOLN
INTRAVENOUS | Status: AC | PRN
  Administered 2017-02-13: 20 mg via INTRAVENOUS

## 2017-02-13 MED ORDER — SODIUM CHLORIDE 0.9 % IV BOLUS (SEPSIS)
250.0000 mL | Freq: Once | INTRAVENOUS | Status: AC
  Administered 2017-02-13: 250 mL via INTRAVENOUS

## 2017-02-13 MED ORDER — PIPERACILLIN-TAZOBACTAM 3.375 G IVPB: 3.3750 g | Freq: Three times a day (TID) | INTRAVENOUS | Status: DC

## 2017-02-13 MED ORDER — ONDANSETRON HCL 4 MG PO TABS: 4.0000 mg | ORAL_TABLET | Freq: Four times a day (QID) | ORAL | Status: DC | PRN

## 2017-02-13 MED ORDER — HYDRALAZINE HCL 20 MG/ML IJ SOLN
10.0000 mg | Freq: Four times a day (QID) | INTRAMUSCULAR | Status: DC | PRN
  Administered 2017-02-13 – 2017-02-21 (×6): 10 mg via INTRAVENOUS
  Filled 2017-02-13 (×7): qty 1

## 2017-02-13 MED ORDER — LORAZEPAM 2 MG/ML IJ SOLN
2.0000 mg | Freq: Once | INTRAMUSCULAR | Status: AC
  Administered 2017-02-13: 2 mg via INTRAVENOUS

## 2017-02-13 MED ORDER — VANCOMYCIN HCL IN DEXTROSE 1-5 GM/200ML-% IV SOLN: 1000.0000 mg | INTRAVENOUS | Status: DC

## 2017-02-13 MED ORDER — ALBUTEROL SULFATE (2.5 MG/3ML) 0.083% IN NEBU
2.5000 mg | INHALATION_SOLUTION | RESPIRATORY_TRACT | Status: DC | PRN
  Administered 2017-02-21: 2.5 mg via RESPIRATORY_TRACT
  Filled 2017-02-13: qty 3

## 2017-02-13 MED ORDER — SODIUM CHLORIDE 0.9 % IV SOLN
0.5000 mg/h | INTRAVENOUS | Status: DC
  Administered 2017-02-13: 0.5 mg/h via INTRAVENOUS
  Filled 2017-02-13: qty 10

## 2017-02-13 MED ORDER — ONDANSETRON HCL 4 MG/2ML IJ SOLN: 4.0000 mg | Freq: Four times a day (QID) | INTRAMUSCULAR | Status: DC | PRN

## 2017-02-13 MED ORDER — PANTOPRAZOLE SODIUM 40 MG IV SOLR
40.0000 mg | INTRAVENOUS | Status: DC
  Administered 2017-02-14 – 2017-02-20 (×8): 40 mg via INTRAVENOUS
  Filled 2017-02-13 (×8): qty 40

## 2017-02-13 NOTE — Progress Notes (Addendum)
Spoke to [employer] - code sepsis 17:48 - she'll try to take Zosyn out shortly.  17:55 RN called pharmacy - Pyxis was not allowing her to take Zosyn out. I walked her through recovering a drawer and she was able to remove it.

## 2017-02-13 NOTE — Progress Notes (Signed)
eLink Physician-Brief Progress Note Patient Name: Melissa Cochran DOB: [birth year] MRN: [id number]   Date of Service  02/13/2017  HPI/Events of Note  80 yo female admitted with status epilepticus and UTI. Intubated and ventilated. PCCM asked to assume care in ICU.  VSS.  Needs stress ulcer prophylaxis.   eICU Interventions  Will order: 1. Protonix 40 mg IV now and Q day.      Intervention Category Evaluation Type: New Patient Evaluation  Lysle Dingwall 02/13/2017, 11:21 PM

## 2017-02-13 NOTE — Progress Notes (Signed)
Keppra tubed to main ED

## 2017-02-13 NOTE — Progress Notes (Signed)
Troponin 11.29.  Maggie, NP notified.

## 2017-02-13 NOTE — Progress Notes (Signed)
Notified Maggie, NP lactic acid 3.6.

## 2017-02-13 NOTE — H&P (Addendum)
[facility] NAME: Melissa Cochran    MR#:  [id number]  DATE OF BIRTH:  [birth year]  DATE OF ADMISSION:  02/13/2017  PRIMARY CARE PHYSICIAN: [name], MD   REQUESTING/REFERRING PHYSICIAN: Merlyn Lot, MD  CHIEF COMPLAINT:   Chief Complaint  Patient presents with  . Seizures   Unresponsiveness and the seizure activity today. HISTORY OF PRESENT ILLNESS:  Melissa Cochran  is a 81 y.o. female with a known history of Hypertension, CHF, COPD, hyperlipidemia and CAD. The patient was sent to ED due to unresponsiveness and seizure today. On arrival to the ER patient was placed on nonrebreather followed by bag-valve-mask. She was intubated in the ED due to persistent seizing for about 20 minutes. She was given Ativan followed by Versed without any improvement. Due to her agonal respirations patient was intubated for airway protection. She is on the sepsis and UTI, given Zosyn in the ED. CT of the head show meningioma, which is stable.  PAST MEDICAL HISTORY:   Past Medical History:  Diagnosis Date  . Adnexal mass   . Anxiety   . Arthritis   . Bladder tumor   . Bleeding ulcer   . CHF (congestive heart failure) ([place])   . CHF (congestive heart failure) ([place])   . COPD (chronic obstructive pulmonary disease) ([place])   . Coronary artery disease   . GERD (gastroesophageal reflux disease)   . Heart block    left  . History of bleeding ulcers   . Hyperlipemia   . Hypertension   . Pneumonia   . Shortness of breath dyspnea     PAST SURGICAL HISTORY:   Past Surgical History:  Procedure Laterality Date  . ABDOMINAL HYSTERECTOMY    . CARDIAC CATHETERIZATION Left 10/21/2015   Procedure: Left Heart Cath and Coronary Angiography;  Surgeon: Isaias Cowman, MD;  Location: [facility];  Service: Cardiovascular;  Laterality: Left;  . CARDIAC CATHETERIZATION N/A 10/21/2015   Procedure: Coronary Stent Intervention;   Surgeon: Isaias Cowman, MD;  Location: [facility];  Service: Cardiovascular;  Laterality: N/A;  . CARDIAC SURGERY     cardiac cath  . CYSTOSCOPY W/ RETROGRADES Bilateral 01/20/2016   Procedure: CYSTOSCOPY WITH RETROGRADE PYELOGRAM;  Surgeon: Hollice Espy, MD;  Location: [facility];  Service: Urology;  Laterality: Bilateral;  . CYSTOSCOPY WITH STENT PLACEMENT Right 01/20/2016   Procedure: CYSTOSCOPY WITH STENT PLACEMENT;  Surgeon: Hollice Espy, MD;  Location: [facility];  Service: Urology;  Laterality: Right;  . HIP SURGERY     left  . STOMACH SURGERY    . TRANSURETHRAL RESECTION OF BLADDER TUMOR N/A 01/20/2016   Procedure: TRANSURETHRAL RESECTION OF BLADDER TUMOR (TURBT);  Surgeon: Hollice Espy, MD;  Location: [facility];  Service: Urology;  Laterality: N/A;    SOCIAL HISTORY:   Social History  Substance Use Topics  . Smoking status: Former Smoker    Packs/day: 1.00    Years: 40.00    Quit date: 06/13/1998  . Smokeless tobacco: Never Used  . Alcohol use No    FAMILY HISTORY:   Family History  Problem Relation Age of Onset  . Stomach cancer Mother   . CVA Father   . Bladder Cancer Neg Hx   . Kidney cancer Neg Hx     DRUG ALLERGIES:  No Known Allergies  REVIEW OF SYSTEMS:   Review of Systems  Unable to perform ROS: Intubated    MEDICATIONS AT HOME:  Prior to Admission medications   Medication Sig Start Date End Date Taking? Authorizing Provider  clopidogrel (PLAVIX) 75 MG tablet TAKE 1 TABLET BY MOUTH ONCE DAILY 01/07/17  Yes [name], MD  metoprolol (LOPRESSOR) 100 MG tablet Take 1 tablet (100 mg total) by mouth 2 (two) times daily. Patient taking differently: Take 100 mg by mouth daily.  10/31/15  Yes [name], MD  apixaban (ELIQUIS) 5 MG TABS tablet Take 5 mg by mouth 2 (two) times daily.    [provider]  docusate sodium (COLACE) 100 MG capsule Take 1 capsule (100 mg total) by mouth 2 (two) times daily. 01/20/16   Hollice Espy, MD  traMADol (ULTRAM) 50 MG tablet Take 100 mg by mouth every 6 (six) hours as needed.    [provider]      VITAL SIGNS:  Blood pressure (!) 184/102, pulse 100, temperature (!) 100.9 F (38.3 C), resp. rate (!) 21, height 5\' 5"  (1.651 m), weight 150 lb (68 kg), SpO2 100 %.  PHYSICAL EXAMINATION:  Physical Exam  GENERAL:  81 y.o.-year-old patient lying in the bed, on Ventilation.  EYES: Pupils equal, round, reactive to light. No scleral icterus. HEENT: Head atraumatic, normocephalic.  NECK:  Supple, no jugular venous distention.  LUNGS: Normal breath sounds bilaterally, no wheezing, rales,rhonchi or crepitation. CARDIOVASCULAR: S1, S2 normal. No murmurs, rubs, or gallops.  ABDOMEN: Soft, nontender, nondistended. Bowel sounds present. No organomegaly or mass.  EXTREMITIES: No pedal edema, cyanosis, or clubbing.  NEUROLOGIC: Unable to exam.  PSYCHIATRIC: The patient is intubated and sedated. SKIN: No obvious rash, lesion, or ulcer.   LABORATORY PANEL:   CBC  Recent Labs Lab 02/13/17 1729  WBC 12.6*  HGB 11.2*  HCT 34.5*  PLT 363   ------------------------------------------------------------------------------------------------------------------  Chemistries   Recent Labs Lab 02/13/17 1729  NA 141  K 4.8  CL 108  CO2 22  GLUCOSE 91  BUN 18  CREATININE 1.03*  CALCIUM 8.5*  AST 72*  ALT 13*  ALKPHOS 120  BILITOT 0.5   ------------------------------------------------------------------------------------------------------------------  Cardiac Enzymes  Recent Labs Lab 02/13/17 1729  TROPONINI 1.95*   ------------------------------------------------------------------------------------------------------------------  RADIOLOGY:  Dg Abdomen 1 View  Result Date: 02/13/2017 CLINICAL DATA:  81 y/o  F; orogastric tube placement. EXAM: ABDOMEN - 1 VIEW COMPARISON:  07/08/2016 CT abdomen and pelvis. FINDINGS: Enteric tube tip projects over the  gastric body. Aortic calcific atherosclerosis. Moderate levocurvature of the lumbar spine with apex at L3. Surgical clips and sutures project over the gastroesophageal junction and left upper quadrant. Normal bowel gas pattern. Left kidney lower pole renal calculi as seen on prior CT. IMPRESSION: Enteric tube tip projects over the gastric body. Electronically Signed   By: Kristine Garbe M.D.   On: 02/13/2017 18:12   Ct Head Wo Contrast  Result Date: 02/13/2017 CLINICAL DATA:  81 y/o  F; high fever an active seizure on arrival. EXAM: CT HEAD WITHOUT CONTRAST TECHNIQUE: Contiguous axial images were obtained from the base of the skull through the vertex without intravenous contrast. COMPARISON:  07/08/2016 CT head FINDINGS: Brain: No evidence of acute infarction, hemorrhage, hydrocephalus, extra-axial collection or new mass lesion/mass effect. Stable right parietal region dural dense mass measuring 21 mm in axial plane compatible with meningioma. Stable chronic microvascular ischemic changes and parenchymal volume loss of the brain. Small stable chronic infarction of left lateral cerebellar hemisphere. Vascular: Calcific atherosclerosis of carotid siphons. No hyperdense vessel. Skull: Normal. Negative for fracture or focal lesion. Sinuses/Orbits:  No acute finding. Other: None. IMPRESSION: 1. No acute intracranial abnormality identified. 2. Stable right parietal meningioma. 3. Stable chronic microvascular ischemic changes and parenchymal volume loss of the brain. Electronically Signed   By: Kristine Garbe M.D.   On: 02/13/2017 18:41   Dg Chest Port 1 View  Result Date: 02/13/2017 CLINICAL DATA:  81 y/o F; code sepsis, post intubation, evaluate for pneumonia or pneumothorax. Patient actively season. EXAM: PORTABLE CHEST 1 VIEW COMPARISON:  07/08/2016 chest CT and chest radiograph. FINDINGS: Endotracheal tube 2.8 cm from the carina. Enteric tube extends below the field of view into the abdomen.  Surgical clip at the gastroesophageal junction. Stable normal cardiac silhouette. Aortic calcific atherosclerosis. No focal consolidation, effusion, or pneumothorax. Mild stable biapical pleuroparenchymal scarring. IMPRESSION: Endotracheal tube 2.8 cm from carina. Enteric tube tip below field of view in the abdomen. No acute pulmonary process. Electronically Signed   By: Kristine Garbe M.D.   On: 02/13/2017 18:11      IMPRESSION AND PLAN:   Status epilepticus The patient will be admitted to ICU, continue sedation medication IV, Keppra IV and neurology consult.  Acute respiratory failure with hypoxia Continue ventilation and intensivist consult.  Sepsis due to UTI Start sepsis protocol, continue antibiotics, follow-up CBC and cultures. Elevated troponin. Possible due to demanding ischemia secondary to seizure, respiratory failure and sepsis. Follow-up troponin level and cardiology consult.  Accelerated hypertension. IV hydralazine when necessary. COPD. Stable. Chronic diastolic CHF. Stable.  I Discussed with Elink intensivist. All the records are reviewed and case discussed with ED provider. Management plans discussed with the patient's daughter and granddaughter and they are in agreement.  CODE STATUS: Full code  TOTAL CRITICAL TIME TAKING CARE OF THIS PATIENT: 66 minutes.    Demetrios Loll M.D on 02/13/2017 at 6:56 PM  Between 7am to 6pm - Pager - [phone]  After 6pm go to [email or web address] - Proofreader  [facility]  Office  [phone]  CC: Primary care physician; [name], MD   Note: This dictation was prepared with Dragon dictation along with smaller phrase technology. Any transcriptional errors that result from this process are unin

## 2017-02-13 NOTE — Progress Notes (Signed)
Assisted with patient transport to CT while on ventilator with no complications noted

## 2017-02-13 NOTE — ED Notes (Signed)
Attempted report x1, nurse will call back she is in with another patient at this time

## 2017-02-13 NOTE — ED Provider Notes (Signed)
[facility] Emergency Department Provider Note    First MD Initiated Contact with Patient 02/13/17 1726     (approximate)  I have reviewed the triage vital signs and the nursing notes.   HISTORY  Chief Complaint Unresponsive, seizure activity   Level V caveat:  AMS, status epilepticus  HPI Melissa Cochran is a 81 y.o. female who arrives via EMS unresponsive with evidence of active generalized tonic-clonic shaking predominantly on the left side with rightward is deviation to the right. Patient reportedly was altered and told family that she was feeling unwell then became unresponsive EMS was called. When EMS arrived the patient is persistently seizing and hypoxic to 70%. Her oxygenation improved to the 90s on nonrebreather mask and they brought her immediately to the ER. Seizing time approximately greater than 20 minutes at this point.   Past Medical History:  Diagnosis Date  . Adnexal mass   . Anxiety   . Arthritis   . Bladder tumor   . Bleeding ulcer   . CHF (congestive heart failure) ([place])   . CHF (congestive heart failure) ([place])   . COPD (chronic obstructive pulmonary disease) ([place])   . Coronary artery disease   . GERD (gastroesophageal reflux disease)   . Heart block    left  . History of bleeding ulcers   . Hyperlipemia   . Hypertension   . Pneumonia   . Shortness of breath dyspnea    Family History  Problem Relation Age of Onset  . Stomach cancer Mother   . CVA Father   . Bladder Cancer Neg Hx   . Kidney cancer Neg Hx    Past Surgical History:  Procedure Laterality Date  . ABDOMINAL HYSTERECTOMY    . CARDIAC CATHETERIZATION Left 10/21/2015   Procedure: Left Heart Cath and Coronary Angiography;  Surgeon: Isaias Cowman, MD;  Location: [place] [facility];  Service: Cardiovascular;  Laterality: Left;  . CARDIAC CATHETERIZATION N/A 10/21/2015   Procedure: Coronary Stent Intervention;  Surgeon: Isaias Cowman, MD;   Location: [place] [facility];  Service: Cardiovascular;  Laterality: N/A;  . CARDIAC SURGERY     cardiac cath  . CYSTOSCOPY W/ RETROGRADES Bilateral 01/20/2016   Procedure: CYSTOSCOPY WITH RETROGRADE PYELOGRAM;  Surgeon: Hollice Espy, MD;  Location: [facility];  Service: Urology;  Laterality: Bilateral;  . CYSTOSCOPY WITH STENT PLACEMENT Right 01/20/2016   Procedure: CYSTOSCOPY WITH STENT PLACEMENT;  Surgeon: Hollice Espy, MD;  Location: [facility];  Service: Urology;  Laterality: Right;  . HIP SURGERY     left  . STOMACH SURGERY    . TRANSURETHRAL RESECTION OF BLADDER TUMOR N/A 01/20/2016   Procedure: TRANSURETHRAL RESECTION OF BLADDER TUMOR (TURBT);  Surgeon: Hollice Espy, MD;  Location: [facility];  Service: Urology;  Laterality: N/A;   Patient Active Problem List   Diagnosis Date Noted  . Convulsive status epilepticus ([place]) 02/13/2017  . Bilateral lower extremity edema 10/08/2016  . Atherosclerosis of both lower extremities with bilateral ulceration of midfeet ([place]) 08/24/2016  . Venous ulcer of ankle ([place]) 07/08/2016  . HTN (hypertension), malignant 07/08/2016  . DVT (deep venous thrombosis) ([place]) 07/02/2016  . Swelling of limb 06/08/2016  . Pain in limb 06/08/2016  . Altered mental status 01/22/2016  . S/P coronary artery stent placement 10/30/2015  . Atherosclerotic heart disease of native coronary artery without angina pectoris 10/21/2015  . Preop cardiovascular exam 10/08/2015  . SOB (shortness of breath) on exertion 10/08/2015  . Malignant neoplasm of trigone of  bladder ([place]) 09/29/2015  . Mass of lower lobe of left lung, incidetnal on CT 09/2015, smoker.  09/29/2015  . Other disorders of lung 09/29/2015  . Other microscopic hematuria 08/26/2015  . Kidney stone 08/26/2015  . Urge incontinence 08/26/2015  . Pneumonia 01/24/2015  . PNA (pneumonia) 01/24/2015  . Venous insufficiency of both lower extremities 12/09/2014  . Neuropathy 12/09/2014  . Essential (primary)  hypertension 12/09/2014  . Polyneuropathy 12/09/2014  . Chronic venous insufficiency 12/09/2014  . Adnexal mass 11/28/2013  . History of cardiac catheterization 11/28/2013  . BP (high blood pressure) 11/28/2013  . Hyperlipidemia 11/28/2013  . Other specified postprocedural states 11/28/2013      Prior to Admission medications   Medication Sig Start Date End Date Taking? Authorizing Provider  clopidogrel (PLAVIX) 75 MG tablet TAKE 1 TABLET BY MOUTH ONCE DAILY 01/07/17  Yes [name], MD  metoprolol (LOPRESSOR) 100 MG tablet Take 1 tablet (100 mg total) by mouth 2 (two) times daily. Patient taking differently: Take 100 mg by mouth daily.  10/31/15  Yes [name], MD  apixaban (ELIQUIS) 5 MG TABS tablet Take 5 mg by mouth 2 (two) times daily.    [provider]  docusate sodium (COLACE) 100 MG capsule Take 1 capsule (100 mg total) by mouth 2 (two) times daily. 01/20/16   Hollice Espy, MD  traMADol (ULTRAM) 50 MG tablet Take 100 mg by mouth every 6 (six) hours as needed.    [provider]    Allergies Patient has no known allergies.    Social History Social History  Substance Use Topics  . Smoking status: Former Smoker    Packs/day: 1.00    Years: 40.00    Quit date: 06/13/1998  . Smokeless tobacco: Never Used  . Alcohol use No    Review of Systems Unable to assess 2/2 critical condition ____________________________________________   PHYSICAL EXAM:  VITAL SIGNS: Vitals:   02/13/17 1830 02/13/17 1900  BP: (!) 184/102 (!) 189/100  Pulse: 100 97  Resp: (!) 21 (!) 22  Temp: (!) 100.9 F (38.3 C) 100.3 F (37.9 C)  SpO2: 100% 100%    Constitutional: Critically ill and actively seizing Eyes: Conjunctivae are normal. There is rightward gaze deviation pupils are midpoint bilaterally Head: Atraumatic. Nose: No congestion/rhinnorhea. Mouth/Throat: Mucous membranes are moist.  Edentulous Neck: No stridor. Painless ROM.  Cardiovascular:  Tachycardic rate, regular rhythm. Grossly normal heart sounds.  Good peripheral circulation. Respiratory: Agonal respirations with rhonchi heard bilaterally Gastrointestinal: Soft  No distention.  Genitourinary: Heavily soiled towels in the groin with what appear to be flies. Smells heavily of old urine. No crepitus or blistering noted. Musculoskeletal: No lower extremity edema.  No joint effusions. Neurologic:  Diffuse tonic-clonic shaking predominant left side but it is generalized with rightward gaze deviation. Remainder of neuro exam is limited. Skin:  Skin is warm, dry and intact. No rash noted. Psychiatric: unable to assess ____________________________________________   LABS (all labs ordered are listed, but only abnormal results are displayed)  Results for orders placed or performed during the hospital encounter of 02/13/17 (from the past 24 hour(s))  Glucose, capillary     Status: Abnormal   Collection Time: 02/13/17  5:20 PM  Result Value Ref Range   Glucose-Capillary 125 (H) 65 - 99 mg/dL  Lactic acid, plasma     Status: Abnormal   Collection Time: 02/13/17  5:29 PM  Result Value Ref Range   Lactic Acid, Venous 5.8 (HH) 0.5 - 1.9  mmol/L  Comprehensive metabolic panel     Status: Abnormal   Collection Time: 02/13/17  5:29 PM  Result Value Ref Range   Sodium 141 135 - 145 mmol/L   Potassium 4.8 3.5 - 5.1 mmol/L   Chloride 108 101 - 111 mmol/L   CO2 22 22 - 32 mmol/L   Glucose, Bld 91 65 - 99 mg/dL   BUN 18 6 - 20 mg/dL   Creatinine, Ser 1.03 (H) 0.44 - 1.00 mg/dL   Calcium 8.5 (L) 8.9 - 10.3 mg/dL   Total Protein 5.8 (L) 6.5 - 8.1 g/dL   Albumin 2.7 (L) 3.5 - 5.0 g/dL   AST 72 (H) 15 - 41 U/L   ALT 13 (L) 14 - 54 U/L   Alkaline Phosphatase 120 38 - 126 U/L   Total Bilirubin 0.5 0.3 - 1.2 mg/dL   GFR calc non Af Amer 50 (L) >60 mL/min   GFR calc Af Amer 58 (L) >60 mL/min   Anion gap 11 5 - 15  Troponin I     Status: Abnormal   Collection Time: 02/13/17  5:29 PM    Result Value Ref Range   Troponin I 1.95 (HH) <0.03 ng/mL  CBC WITH DIFFERENTIAL     Status: Abnormal   Collection Time: 02/13/17  5:29 PM  Result Value Ref Range   WBC 12.6 (H) 3.6 - 11.0 K/uL   RBC 3.78 (L) 3.80 - 5.20 MIL/uL   Hemoglobin 11.2 (L) 12.0 - 16.0 g/dL   HCT 34.5 (L) 35.0 - 47.0 %   MCV 91.3 80.0 - 100.0 fL   MCH 29.5 26.0 - 34.0 pg   MCHC 32.3 32.0 - 36.0 g/dL   RDW 15.4 (H) 11.5 - 14.5 %   Platelets 363 150 - 440 K/uL   Neutrophils Relative % 94 %   Neutro Abs 11.8 (H) 1.4 - 6.5 K/uL   Lymphocytes Relative 2 %   Lymphs Abs 0.3 (L) 1.0 - 3.6 K/uL   Monocytes Relative 4 %   Monocytes Absolute 0.5 0.2 - 0.9 K/uL   Eosinophils Relative 0 %   Eosinophils Absolute 0.0 0 - 0.7 K/uL   Basophils Relative 0 %   Basophils Absolute 0.0 0 - 0.1 K/uL  Blood gas, venous ([facility], [facility], [facility])     Status: Abnormal   Collection Time: 02/13/17  5:29 PM  Result Value Ref Range   FIO2 0.60    Delivery systems VENTILATOR    Mode ASSIST CONTROL    VT 500 mL   LHR 16 resp/min   Hi Frequency JET Vent Rate 14    Peep/cpap 5.0 cm H20   pH, Ven 7.28 7.250 - 7.430   pCO2, Ven 46 44.0 - 60.0 mmHg   pO2, Ven 119.0 (H) 32.0 - 45.0 mmHg   Bicarbonate 23.2 20.0 - 28.0 mmol/L   Acid-base deficit 3.2 (H) 0.0 - 2.0 mmol/L   O2 Saturation 97.4 %   Patient temperature 39.4    Collection site VEIN    Sample type VENIPUNCTURE   Type and screen [facility]     Status: None   Collection Time: 02/13/17  5:29 PM  Result Value Ref Range   ABO/RH(D) A NEG    Antibody Screen NEG    Sample Expiration 02/16/2017   Urinalysis, Complete w Microscopic     Status: Abnormal   Collection Time: 02/13/17  5:29 PM  Result Value Ref Range   Color, Urine YELLOW (A) YELLOW  APPearance TURBID (A) CLEAR   Specific Gravity, Urine 1.012 1.005 - 1.030   pH 5.0 5.0 - 8.0   Glucose, UA 150 (A) NEGATIVE mg/dL   Hgb urine dipstick LARGE (A) NEGATIVE   Bilirubin Urine NEGATIVE NEGATIVE   Ketones,  ur NEGATIVE NEGATIVE mg/dL   Protein, ur 100 (A) NEGATIVE mg/dL   Nitrite NEGATIVE NEGATIVE   Leukocytes, UA LARGE (A) NEGATIVE   RBC / HPF TOO NUMEROUS TO COUNT 0 - 5 RBC/hpf   WBC, UA TOO NUMEROUS TO COUNT 0 - 5 WBC/hpf   Bacteria, UA MANY (A) NONE SEEN   Squamous Epithelial / LPF 0-5 (A) NONE SEEN   WBC Clumps PRESENT    Mucus PRESENT   Protime-INR     Status: Abnormal   Collection Time: 02/13/17  5:29 PM  Result Value Ref Range   Prothrombin Time 15.7 (H) 11.4 - 15.2 seconds   INR 1.26    ____________________________________________  EKG My review and personal interpretation at Time: 18:10   Indication: ams  Rate: 110  Rhythm: sinus Axis: normal Other: poor r wave progression, no stemi, normal intervals ____________________________________________  RADIOLOGY  I personally reviewed all radiographic images ordered to evaluate for the above acute complaints and reviewed radiology reports and findings.  These findings were personally discussed with the patient.  Please see medical record for radiology report.  ____________________________________________   PROCEDURES  Procedure(s) performed:  Procedure Name: Intubation Date/Time: 02/13/2017 5:32 PM Performed by: Merlyn Lot Pre-anesthesia Checklist: Suction available and Emergency Drugs available Oxygen Delivery Method: Ambu bag Preoxygenation: Pre-oxygenation with 100% oxygen Induction Type: Rapid sequence Laryngoscope Size: Glidescope and 4 Grade View: Grade I Tube size: 7.5 mm Number of attempts: 1 Airway Equipment and Method: Video-laryngoscopy Placement Confirmation: ETT inserted through vocal cords under direct vision,  Positive ETCO2,  CO2 detector and Breath sounds checked- equal and bilateral Secured at: 20 cm Tube secured with: Tape Dental Injury: Teeth and Oropharynx as per pre-operative assessment          Critical Care performed:  Yes CRITICAL CARE Performed by: Merlyn Lot   Total critical care time: 50 minutes  Critical care time was exclusive of separately billable procedures and treating other patients.  Critical care was necessary to treat or prevent imminent or life-threatening deterioration.  Critical care was time spent personally by me on the following activities: development of treatment plan with patient and/or surrogate as well as nursing, discussions with consultants, evaluation of patient's response to treatment, examination of patient, obtaining history from patient or surrogate, ordering and performing treatments and interventions, ordering and review of laboratory studies, ordering and review of radiographic studies, pulse oximetry and re-evaluation of patient's condition.  ____________________________________________   INITIAL IMPRESSION / ASSESSMENT AND PLAN / ED COURSE  Pertinent labs & imaging results that were available during my care of the patient were reviewed by me and considered in my medical decision making (see chart for details).  DDX: Dehydration, sepsis, pna, uti, hypoglycemia, cva, drug effect, withdrawal, encephalitis   NOELENE GANG is a 81 y.o. who presents to the ED with persistent seizure seizing and status epilepticus. On arrival to the ER patient was placed on nonrebreather followed by bag-valve-mask. IV access was obtained. Stat glucose was normal at 125. Patient was given Ativan followed by Versed without any improvement. Due to her agonal respirations patient was intubated for airway protection and my concern for her critical illness. Blood work sent for the above differential. Will be started  on broad-spectrum antibiotics due to concern for fever and sepsis with possible encephalitis. Patient will be taken emergently to CT scanner to evaluate for evidence of bleed or mass.  Clinical Course as of Feb 13 1922  Nancy Fetter Feb 13, 2017  [place] Blood work thus far shows evidence of acute leukocytosis as well as  urine analysis that is consistent with urine Neri tract infection. She has a mild AKI that is acute. Her troponin is elevated to 1.95.  more likely secondary to status epilepticus sepsis then ACS based on her presentation. Do not see any evidence of bleed on her CT scan but she does have intracranial mass which certainly be a foci for seizure disorder. Patient is being started on propofol drip in addition to IV Versed. She is receiving broad-spectrum antibiotics at this moment and continued fluid resuscitation. Patient will be admitted to the ICU for further evaluation and management. Family was updated at bedside.  [PR]  1922 Patient with profound lactic acidosis. Patient with very poor prognosis and family was updated with this. Patient will be admitted to ICU for further evaluation and management. Remains in critical condition.  [PR]    Clinical Course User Index [PR] Merlyn Lot, MD     ____________________________________________   FINAL CLINICAL IMPRESSION(S) / ED DIAGNOSES  Final diagnoses:  Status epilepticus ([place])  Sepsis, due to unspecified organism ([place])  Elevated troponin I level  Acute respiratory failure with hypoxia ([place])      NEW MEDICATIONS STARTED DURING THIS VISIT:  New Prescriptions   No medications on file     Note:  This document was prepared using Dragon voice recognition software and may include unintentional dictation errors.    Merlyn Lot, MD 02/13/17 [phone]

## 2017-02-13 NOTE — ED Notes (Signed)
On vent:  AC 14  VT:  500  FiO2:  060  Peep: 5

## 2017-02-13 NOTE — ED Notes (Signed)
Date and time results received: 02/13/17 1910  Test: Lactic Acid Critical Value: 5.8  Name of Provider Notified: Bridgett Larsson  Orders Received? Or Actions Taken?: None

## 2017-02-13 NOTE — ED Triage Notes (Signed)
Arrives via [facility].  Patient actively seizing, grand mal.  Unresponsive.  To ED 19.  Dr. Quentin Cornwall at bedside.

## 2017-02-13 NOTE — Consult Note (Signed)
PULMONARY / CRITICAL CARE MEDICINE   Name: Melissa Cochran MRN: [id number] DOB: [birth year]    ADMISSION DATE:  02/13/2017   CONSULTATION DATE:  02/13/2017  REFERRING MD:  Dr. Bridgett Larsson  REASON: Status epilepticus and acute respiratory failure  CHIEF COMPLAINT:  unresponsive  HISTORY OF PRESENT ILLNESS:   This is a 81 year old Caucasian female with a past medical history as indicated below, no prior seizure history who presented to the emergency room via EMS after becoming unresponsive at home. History is obtained from ED records and from patient's family, as she is currently intubated and sedated. The patient's family, she was in her usual state of health all  Morning but in the afternoon and she complained of not feeling well.suddenly she started shaking and became unresponsive. When EMS arrived, patient was still having generalized tonic-clonic seizures. She was hypoxic with SPO2 in the 70s. She was placed on a nonrebreather mask and transferred to the emergency room. Total seizure time is estimated at about 20 minutes. She is being admitted to the ICU for further management. Her initial troponin is 1.95. CT head is negative for any acute bleed or infarct.  Patient is currently on a Versed drip with no further seizure activity. 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  PAST SURGICAL HISTORY: She  has a past surgical history that includes Cardiac surgery;  Abdominal hysterectomy; Stomach surgery; Hip surgery; Cardiac catheterization (Left, 10/21/2015); Cardiac catheterization (N/A, 10/21/2015); Transurethral resection of bladder tumor (N/A, 01/20/2016); Cystoscopy w/ retrogrades (Bilateral, 01/20/2016); and Cystoscopy with stent placement (Right, 01/20/2016).  No Known Allergies  No current facility-administered medications on file prior to encounter.    Current Outpatient Prescriptions on File Prior to Encounter  Medication Sig  . clopidogrel (PLAVIX) 75 MG tablet TAKE 1 TABLET BY MOUTH ONCE DAILY  . metoprolol (LOPRESSOR) 100 MG tablet Take 1 tablet (100 mg total) by mouth 2 (two) times daily. (Patient taking differently: Take 100 mg by mouth daily. )  . apixaban (ELIQUIS) 5 MG TABS tablet Take 5 mg by mouth 2 (two) times daily.  Marland Kitchen docusate sodium (COLACE) 100 MG capsule Take 1 capsule (100 mg total) by mouth 2 (two) times daily.  . traMADol (ULTRAM) 50 MG tablet Take 100 mg by mouth every 6 (six) hours as needed.    FAMILY HISTORY:  Her indicated that her mother is deceased. She indicated that her father is deceased. She indicated that the status of her neg hx is unknown.    SOCIAL HISTORY: She  reports that she quit smoking about 18 years ago. She has a 40.00 pack-year smoking history. She has never used smokeless tobacco. She reports that she does not drink alcohol or use drugs.  REVIEW OF SYSTEMS:   Unable to obtain as patient is sedated and intubated  SUBJECTIVE:   VITAL SIGNS: BP (!) 144/75   Pulse 99   Temp 97.8 F (36.6 C) (Axillary)   Resp 14   Ht 5\' 5"  (1.651 m)   Wt 150 lb (68 kg)   SpO2 100%   BMI  24.96 kg/m   HEMODYNAMICS:    VENTILATOR SETTINGS: Vent Mode: PRVC FiO2 (%):  [60 %] 60 % Set Rate:  [14 bmp] 14 bmp Vt Set:  [500 mL] 500 mL PEEP:  [5 cmH20] 5 cmH20 Plateau Pressure:  [18 cmH20] 18 cmH20  INTAKE / OUTPUT: No intake/output data recorded.  PHYSICAL EXAMINATION: General:  NAD Neuro:  Withdraws to  noxious stimulus, pupils are reactive, positive corneal reflexes, positive gag reflex,no posturing HEENT: PERRLA, trachea midline, no JVD Cardiovascular: Sinus tachycardia, S1, S2, no murmur, regurg or gallop, +2 pulses in upper extremities, +1 in bilateral lower extremities,no edema  Lungs:  ETT, BILATERAL BREATH SOUNDS, DIMINISHED IN THE BASES, NO WHEEZING Abdomen:  Soft, nontender, nondistended, normal bowel sounds Musculoskeletal:  Positive range of motion, no deformities Skin:  Venostasis discoloration in bilateral lower extremities  LABS:  BMET  Recent Labs Lab 02/13/17 1729  NA 141  K 4.8  CL 108  CO2 22  BUN 18  CREATININE 1.03*  GLUCOSE 91    Electrolytes  Recent Labs Lab 02/13/17 1729  CALCIUM 8.5*    CBC  Recent Labs Lab 02/13/17 1729  WBC 12.6*  HGB 11.2*  HCT 34.5*  PLT 363    Coag's  Recent Labs Lab 02/13/17 1729  INR 1.26    Sepsis Markers  Recent Labs Lab 02/13/17 1729 02/13/17 2135  LATICACIDVEN 5.8* 3.6*  PROCALCITON  --  1.97    ABG No results for input(s): PHART, PCO2ART, PO2ART in the last 168 hours.  Liver Enzymes  Recent Labs Lab 02/13/17 1729  AST 72*  ALT 13*  ALKPHOS 120  BILITOT 0.5  ALBUMIN 2.7*    Cardiac Enzymes  Recent Labs Lab 02/13/17 1729 02/13/17 2135  TROPONINI 1.95* 11.29*    Glucose  Recent Labs Lab 02/13/17 1720  GLUCAP 125*    Imaging Dg Abdomen 1 View  Result Date: 02/13/2017 CLINICAL DATA:  81 y/o  F; orogastric tube placement. EXAM: ABDOMEN - 1 VIEW COMPARISON:  07/08/2016 CT abdomen and pelvis. FINDINGS: Enteric tube tip projects over the gastric body. Aortic calcific atherosclerosis. Moderate levocurvature of the lumbar spine with apex at L3. Surgical clips and sutures project over the gastroesophageal junction and left upper quadrant. Normal bowel gas pattern. Left kidney lower pole renal calculi as seen on prior CT. IMPRESSION: Enteric tube tip projects over the gastric body.  Electronically Signed   By: Kristine Garbe M.D.   On: 02/13/2017 18:12   Ct Head Wo Contrast  Result Date: 02/13/2017 CLINICAL DATA:  81 y/o  F; high fever an active seizure on arrival. EXAM: CT HEAD WITHOUT CONTRAST TECHNIQUE: Contiguous axial images were obtained from the base of the skull through the vertex without intravenous contrast. COMPARISON:  07/08/2016 CT head FINDINGS: Brain: No evidence of acute infarction, hemorrhage, hydrocephalus, extra-axial collection or new mass lesion/mass effect. Stable right parietal region dural dense mass measuring 21 mm in axial plane compatible with meningioma. Stable chronic microvascular ischemic changes and parenchymal volume loss of the brain. Small stable chronic infarction of left lateral cerebellar hemisphere. Vascular: Calcific atherosclerosis of carotid siphons. No hyperdense vessel. Skull: Normal. Negative for fracture or focal lesion. Sinuses/Orbits: No acute finding. Other: None. IMPRESSION: 1. No acute intracranial abnormality identified. 2. Stable right parietal meningioma. 3. Stable chronic microvascular ischemic changes and parenchymal volume loss of the brain. Electronically Signed   By: Kristine Garbe M.D.   On: 02/13/2017 18:41   Dg Chest Port 1 View  Result Date: 02/13/2017  CLINICAL DATA:  81 y/o F; code sepsis, post intubation, evaluate for pneumonia or pneumothorax. Patient actively season. EXAM: PORTABLE CHEST 1 VIEW COMPARISON:  07/08/2016 chest CT and chest radiograph. FINDINGS: Endotracheal tube 2.8 cm from the carina. Enteric tube extends below the field of view into the abdomen. Surgical clip at the gastroesophageal junction. Stable normal cardiac silhouette. Aortic calcific atherosclerosis. No focal consolidation, effusion, or pneumothorax. Mild stable biapical pleuroparenchymal scarring. IMPRESSION: Endotracheal tube 2.8 cm from carina. Enteric tube tip below field of view in the abdomen. No acute pulmonary process.  Electronically Signed   By: Kristine Garbe M.D.   On: 02/13/2017 18:11    STUDIES:  Last 2-D echo was 01/25/2015 LV EF: 55% -   60%  CULTURES: Blood cultures Urine culture Sputum culture  ANTIBIOTICS: Ceftriaxone. Vancomycin  SIGNIFICANT EVENTS: 02/13/2017: Admitted with status epilepticus and respiratory failure  LINES/TUBES: Peripheral IVs  DISCUSSION: This is a 81 year old female presenting with severe sepsis of unknown source,status epilepticus and acute hypoxemic respiratory failure.  ASSESSMENT Status epilepticus-CT head negative; no prior history of seizures; most likely etiology is infectious process Sepsis of unknown source Acute hypoxic respiratory failure Elevated troponin Severe peripheral artery disease with femoral thrombus-on eliquis History of : CHF, COPD, and CAD   PLAN Hemodynamics per ICU protocol Full vent support with current settings ABG and CXR reviewed Gentle IV hydration Neurology consulted; awaiting in put Cardiology consulted; spoke with Dr. Ubaldo Glassing; no heparin for now. Patient was on Eliquis at home Continue to trend cardiac enzymes Patient may needs MRI in am EEG in am Versed infusion and propofol for burst suppression and sedation Seizure precautions GI and DVT prophylaxis  Further changes in treatment plan pending clinical course and diagnostics  FAMILY  - Updates: Patient's daughters updated at bedside.   - Inter-disciplinary family meet or Palliative Care meeting due by:  day [place]. [facility] ANP-BC Pulmonary and Critical Care Medicine [facility] Pager [phone] or [phone]  02/13/2017, 10:38 PM

## 2017-02-13 NOTE — Progress Notes (Addendum)
Pharmacy Antibiotic Note  Melissa Cochran is a 81 y.o. female admitted on 02/13/2017 with sepsis.  Pharmacy has been consulted for  Ceftriaxone dosing.  Plan: Admitting hospitalist did not address/reconcile ED consults. Dr. Jannifer Franklin says stop Vanc/Zosyn, use CTX PTD as ordered by admitting.  Ceftriaxone 1 gm IV Q24H  Height: 5\' 5"  (165.1 cm) Weight: 150 lb (68 kg) IBW/kg (Calculated) : 57  No data recorded.   Recent Labs Lab 02/13/17 1729  WBC 12.6*  CREATININE 1.03*    Estimated Creatinine Clearance: 39.9 mL/min (A) (by C-G formula based on SCr of 1.03 mg/dL (H)).    No Known Allergies  Thank you for allowing pharmacy to be a part of this patient's care.  Laural Benes, Pharm.D., BCPS Clinical Pharmacist 02/13/2017 6:37 PM

## 2017-02-14 ENCOUNTER — Inpatient Hospital Stay: Payer: Medicare Other

## 2017-02-14 ENCOUNTER — Inpatient Hospital Stay (HOSPITAL_COMMUNITY): Payer: Medicare Other

## 2017-02-14 ENCOUNTER — Inpatient Hospital Stay
Admit: 2017-02-14 | Discharge: 2017-02-14 | Disposition: A | Discharge status: Home / Self Care | Payer: Medicare Other | Attending: Cardiology | Admitting: Cardiology

## 2017-02-14 DIAGNOSIS — G40901 Epilepsy, unspecified, not intractable, with status epilepticus: Secondary | ICD-10-CM

## 2017-02-14 LAB — BASIC METABOLIC PANEL
Anion gap: 11 (ref 5–15)
BUN: 14 mg/dL (ref 6–20)
CALCIUM: 8.5 mg/dL — AB (ref 8.9–10.3)
CHLORIDE: 106 mmol/L (ref 101–111)
CO2: 25 mmol/L (ref 22–32)
CREATININE: 0.89 mg/dL (ref 0.44–1.00)
GFR calc Af Amer: >60 mL/min (ref >60)
GFR calc non Af Amer: >60 mL/min (ref >60)
Glucose, Bld: 126 mg/dL — ABNORMAL HIGH (ref 65–99)
Potassium: 3.6 mmol/L (ref 3.5–5.1)
SODIUM: 142 mmol/L (ref 135–145)

## 2017-02-14 LAB — ECHOCARDIOGRAM COMPLETE
Height: 65 in
Weight: 2400 oz

## 2017-02-14 LAB — URINE DRUG SCREEN, QUALITATIVE (ARMC ONLY)
AMPHETAMINES, UR SCREEN: NOT DETECTED
BARBITURATES, UR SCREEN: NOT DETECTED
BENZODIAZEPINE, UR SCRN: NOT DETECTED
COCAINE METABOLITE, UR ~~LOC~~: NOT DETECTED
Cannabinoid 50 Ng, Ur ~~LOC~~: NOT DETECTED
MDMA (Ecstasy)Ur Screen: NOT DETECTED
METHADONE SCREEN, URINE: NOT DETECTED
OPIATE, UR SCREEN: NOT DETECTED
PHENCYCLIDINE (PCP) UR S: NOT DETECTED
Tricyclic, Ur Screen: NOT DETECTED

## 2017-02-14 LAB — CBC
HCT: 39.3 % (ref 35.0–47.0)
HEMOGLOBIN: 12.8 g/dL (ref 12.0–16.0)
MCH: 29 pg (ref 26.0–34.0)
MCHC: 32.5 g/dL (ref 32.0–36.0)
MCV: 89.3 fL (ref 80.0–100.0)
PLATELETS: 307 10*3/uL (ref 150–440)
RBC: 4.4 MIL/uL (ref 3.80–5.20)
RDW: 15.4 % — AB (ref 11.5–14.5)
WBC: 17 10*3/uL — ABNORMAL HIGH (ref 3.6–11.0)

## 2017-02-14 LAB — MRSA PCR SCREENING: MRSA by PCR: NEGATIVE

## 2017-02-14 LAB — GLUCOSE, CAPILLARY
GLUCOSE-CAPILLARY: 89 mg/dL (ref 65–99)
Glucose-Capillary: 103 mg/dL — ABNORMAL HIGH (ref 65–99)
Glucose-Capillary: 130 mg/dL — ABNORMAL HIGH (ref 65–99)
Glucose-Capillary: 161 mg/dL — ABNORMAL HIGH (ref 65–99)
Glucose-Capillary: 174 mg/dL — ABNORMAL HIGH (ref 65–99)

## 2017-02-14 LAB — PHOSPHORUS: Phosphorus: 3.5 mg/dL (ref 2.5–4.6)

## 2017-02-14 LAB — TROPONIN I: Troponin I: 15.17 ng/mL (ref <0.03)

## 2017-02-14 LAB — MAGNESIUM: MAGNESIUM: 1.9 mg/dL (ref 1.7–2.4)

## 2017-02-14 LAB — HEMOGLOBIN A1C
Hgb A1c MFr Bld: 5.1 % (ref 4.8–5.6)
Mean Plasma Glucose: 99.67 mg/dL

## 2017-02-14 MED ORDER — PRO-STAT SUGAR FREE PO LIQD
30.0000 mL | Freq: Two times a day (BID) | ORAL | Status: DC
  Administered 2017-02-14 – 2017-02-22 (×17): 30 mL

## 2017-02-14 MED ORDER — SODIUM CHLORIDE 0.9 % IV SOLN
500.0000 mg | Freq: Once | INTRAVENOUS | Status: AC
  Administered 2017-02-14: 500 mg via INTRAVENOUS
  Filled 2017-02-14: qty 5

## 2017-02-14 MED ORDER — DEXTROSE 5 % IV SOLN
650.0000 mg | Freq: Three times a day (TID) | INTRAVENOUS | Status: DC
  Administered 2017-02-14 – 2017-02-17 (×9): 650 mg via INTRAVENOUS
  Filled 2017-02-14 (×12): qty 13

## 2017-02-14 MED ORDER — SODIUM CHLORIDE 0.9 % IV SOLN
1000.0000 mg | Freq: Two times a day (BID) | INTRAVENOUS | Status: DC
  Administered 2017-02-14 – 2017-02-21 (×13): 1000 mg via INTRAVENOUS
  Filled 2017-02-14 (×15): qty 10

## 2017-02-14 MED ORDER — DEXTROSE 5 % IV SOLN
1.0000 g | Freq: Once | INTRAVENOUS | Status: AC
  Administered 2017-02-14: 1 g via INTRAVENOUS
  Filled 2017-02-14: qty 10

## 2017-02-14 MED ORDER — ADULT MULTIVITAMIN LIQUID CH
15.0000 mL | Freq: Every day | ORAL | Status: DC
  Administered 2017-02-14 – 2017-02-22 (×9): 15 mL
  Filled 2017-02-14 (×10): qty 15

## 2017-02-14 MED ORDER — DEXTROSE 5 % IV SOLN
2.0000 g | Freq: Two times a day (BID) | INTRAVENOUS | Status: DC
  Administered 2017-02-14 – 2017-02-16 (×4): 2 g via INTRAVENOUS
  Filled 2017-02-14 (×5): qty 2

## 2017-02-14 MED ORDER — VITAL HIGH PROTEIN PO LIQD: 1000.0000 mL | ORAL | Status: DC

## 2017-02-14 MED ORDER — VANCOMYCIN HCL IN DEXTROSE 1-5 GM/200ML-% IV SOLN
1000.0000 mg | INTRAVENOUS | Status: DC
  Administered 2017-02-14 – 2017-02-16 (×4): 1000 mg via INTRAVENOUS
  Filled 2017-02-14 (×6): qty 200

## 2017-02-14 MED ORDER — METOPROLOL TARTRATE 5 MG/5ML IV SOLN: 5.0000 mg | Freq: Four times a day (QID) | INTRAVENOUS | Status: DC | PRN

## 2017-02-14 MED ORDER — METOPROLOL TARTRATE 5 MG/5ML IV SOLN
5.0000 mg | INTRAVENOUS | Status: AC
  Administered 2017-02-14: 5 mg via INTRAVENOUS

## 2017-02-14 MED ORDER — VITAL 1.5 CAL PO LIQD
1000.0000 mL | ORAL | Status: DC
  Administered 2017-02-14 – 2017-02-22 (×9): 1000 mL

## 2017-02-14 MED ORDER — MAGNESIUM SULFATE 2 GM/50ML IV SOLN
2.0000 g | Freq: Once | INTRAVENOUS | Status: AC
  Administered 2017-02-14: 2 g via INTRAVENOUS
  Filled 2017-02-14: qty 50

## 2017-02-14 MED ORDER — DILTIAZEM HCL 100 MG IV SOLR
5.0000 mg/h | INTRAVENOUS | Status: DC
  Administered 2017-02-14: 5 mg/h via INTRAVENOUS
  Administered 2017-02-15 (×2): 15 mg/h via INTRAVENOUS
  Administered 2017-02-15: 10 mg/h via INTRAVENOUS
  Filled 2017-02-14 (×5): qty 100

## 2017-02-14 MED ORDER — VANCOMYCIN HCL IN DEXTROSE 1-5 GM/200ML-% IV SOLN
1000.0000 mg | Freq: Once | INTRAVENOUS | Status: AC
  Administered 2017-02-14: 1000 mg via INTRAVENOUS
  Filled 2017-02-14: qty 200

## 2017-02-14 MED ORDER — HYDRALAZINE HCL 20 MG/ML IJ SOLN
5.0000 mg | Freq: Once | INTRAMUSCULAR | Status: AC
  Administered 2017-02-14: 5 mg via INTRAVENOUS

## 2017-02-14 MED ORDER — AMPICILLIN SODIUM 2 G IJ SOLR
2.0000 g | INTRAMUSCULAR | Status: DC
  Administered 2017-02-14 – 2017-02-17 (×18): 2 g via INTRAVENOUS
  Filled 2017-02-14 (×23): qty 2000

## 2017-02-14 MED ORDER — CHLORHEXIDINE GLUCONATE 0.12% ORAL RINSE (MEDLINE KIT)
15.0000 mL | Freq: Two times a day (BID) | OROMUCOSAL | Status: DC
  Administered 2017-02-14 – 2017-02-22 (×18): 15 mL via OROMUCOSAL

## 2017-02-14 MED ORDER — GADOBENATE DIMEGLUMINE 529 MG/ML IV SOLN
15.0000 mL | Freq: Once | INTRAVENOUS | Status: AC | PRN
  Administered 2017-02-14: 14 mL via INTRAVENOUS

## 2017-02-14 MED ORDER — FREE WATER
175.0000 mL | Freq: Four times a day (QID) | Status: DC
  Administered 2017-02-14 – 2017-02-21 (×27): 175 mL

## 2017-02-14 MED ORDER — ORAL CARE MOUTH RINSE
15.0000 mL | OROMUCOSAL | Status: DC
  Administered 2017-02-14 – 2017-02-22 (×78): 15 mL via OROMUCOSAL

## 2017-02-14 MED ORDER — METOPROLOL TARTRATE 5 MG/5ML IV SOLN
INTRAVENOUS | Status: AC
  Filled 2017-02-14: qty 5

## 2017-02-14 NOTE — Consult Note (Signed)
Reason for Consult:Status epilepticus Referring Physician: Kasa  CC: Seizures  HPI: Melissa Cochran is an 81 y.o. female who is intubated and sedated and unable to provide any history.  Family not available at this time therefore all history obtained from the chart.  Patient brought in via EMS on10/7 with GTC seizure (left greater than right) and right eye deviation. Patient reportedly was altered and told family that she was feeling unwell then became unresponsive.  EMS was called. When EMS arrived the patient was persistently seizing and hypoxic to 70%. Her oxygenation improved to the 90s on nonrebreather mask and they brought her immediately to the ER. Seizing time greater than 20 minutes.  Patient intubated and started on Keppra.      Past Medical History:  Diagnosis Date  . Adnexal mass   . Anxiety   . Arthritis   . Bladder tumor   . Bleeding ulcer   . CHF (congestive heart failure) ([place])   . CHF (congestive heart failure) ([place])   . COPD (chronic obstructive pulmonary disease) ([place])   . Coronary artery disease   . GERD (gastroesophageal reflux disease)   . Heart block    left  . History of bleeding ulcers   . Hyperlipemia   . Hypertension   . Pneumonia   . Shortness of breath dyspnea     Past Surgical History:  Procedure Laterality Date  . ABDOMINAL HYSTERECTOMY    . CARDIAC CATHETERIZATION Left 10/21/2015   Procedure: Left Heart Cath and Coronary Angiography;  Surgeon: Isaias Cowman, MD;  Location: [place] [facility];  Service: Cardiovascular;  Laterality: Left;  . CARDIAC CATHETERIZATION N/A 10/21/2015   Procedure: Coronary Stent Intervention;  Surgeon: Isaias Cowman, MD;  Location: [place] [facility];  Service: Cardiovascular;  Laterality: N/A;  . CARDIAC SURGERY     cardiac cath  . CYSTOSCOPY W/ RETROGRADES Bilateral 01/20/2016   Procedure: CYSTOSCOPY WITH RETROGRADE PYELOGRAM;  Surgeon: Hollice Espy, MD;  Location: [facility];  Service: Urology;   Laterality: Bilateral;  . CYSTOSCOPY WITH STENT PLACEMENT Right 01/20/2016   Procedure: CYSTOSCOPY WITH STENT PLACEMENT;  Surgeon: Hollice Espy, MD;  Location: [facility];  Service: Urology;  Laterality: Right;  . HIP SURGERY     left  . STOMACH SURGERY    . TRANSURETHRAL RESECTION OF BLADDER TUMOR N/A 01/20/2016   Procedure: TRANSURETHRAL RESECTION OF BLADDER TUMOR (TURBT);  Surgeon: Hollice Espy, MD;  Location: [facility];  Service: Urology;  Laterality: N/A;    Family History  Problem Relation Age of Onset  . Stomach cancer Mother   . CVA Father   . Bladder Cancer Neg Hx   . Kidney cancer Neg Hx     Social History:  reports that she quit smoking about 18 years ago. She has a 40.00 pack-year smoking history. She has never used smokeless tobacco. She reports that she does not drink alcohol or use drugs.  No Known Allergies  Medications:  I have reviewed the patient's current medications. Prior to Admission:  Facility-Administered Medications Prior to Admission  Medication Dose Route Frequency Provider Last Rate Last Dose  . apixaban (ELIQUIS) tablet 5 mg  5 mg Oral BID Algernon Huxley, MD      . traMADol Veatrice Bourbon) tablet 50 mg  50 mg Oral Q6H PRN Algernon Huxley, MD       Prescriptions Prior to Admission  Medication Sig Dispense Refill Last Dose  . clopidogrel (PLAVIX) 75 MG tablet TAKE 1 TABLET BY MOUTH ONCE  DAILY 30 tablet 0 unknown at unknown  . metoprolol (LOPRESSOR) 100 MG tablet Take 1 tablet (100 mg total) by mouth 2 (two) times daily. (Patient taking differently: Take 100 mg by mouth daily. ) 60 tablet 0 unknown at unknown  . apixaban (ELIQUIS) 5 MG TABS tablet Take 5 mg by mouth 2 (two) times daily.   unknown at unknown  . docusate sodium (COLACE) 100 MG capsule Take 1 capsule (100 mg total) by mouth 2 (two) times daily. 60 capsule 0 unknown at unknown  . traMADol (ULTRAM) 50 MG tablet Take 100 mg by mouth every 6 (six) hours as needed.   unknown at unknown   Scheduled: .  chlorhexidine gluconate (MEDLINE KIT)  15 mL Mouth Rinse BID  . enoxaparin (LOVENOX) injection  40 mg Subcutaneous Q24H  . mouth rinse  15 mL Mouth Rinse 10 times per day  . pantoprazole (PROTONIX) IV  40 mg Intravenous Q24H    ROS: Unable to provide due to intubation and sedation.     Physical Examination: Blood pressure (!) 160/74, pulse (!) 102, temperature (!) 100.9 F (38.3 C), temperature source Core (Comment), resp. rate 14, height '5\' 5"'$  (1.651 m), weight 68 kg (150 lb), SpO2 100 %.  HEENT-  Normocephalic, no lesions, without obvious abnormality.  Normal external eye and conjunctiva.  Normal TM's bilaterally.  Normal auditory canals and external ears. Normal external nose, mucus membranes and septum.  Normal pharynx. Cardiovascular- S1, S2 normal, pulses palpable throughout   Lungs- chest clear, no wheezing, rales, normal symmetric air entry Abdomen- soft, non-tender; bowel sounds normal; no masses,  no organomegaly Extremities- no edema Lymph-no adenopathy palpable Musculoskeletal-no joint tenderness, deformity or swelling Skin-warm and dry, no hyperpigmentation, vitiligo, or suspicious lesions  Neurological Examination  (intubated) Mental Status: Patient does not respond to verbal stimuli.  Does not respond to deep sternal rub.  Does not follow commands.  No verbalizations noted.  Cranial Nerves: II: patient does not respond confrontation bilaterally, pupils right 3 mm, left 3 mm,and reactive bilaterally III,IV,VI: Intact oculocephalic maneuvers bilaterally.  V,VII: corneal reflex present bilaterally  VIII: patient does not respond to verbal stimuli IX,X: gag reflex unable to test, XI: trapezius strength unable to test bilaterally XII: tongue strength unable to test Motor: Moves both lower extremities spontaneously Sensory: Does not respond to noxious stimuli in any extremity. Deep Tendon Reflexes:  2+ throughout with absent AJ's bilaterally. Plantars: downgoing  bilaterally Cerebellar: Unable to perform   Laboratory Studies:   Basic Metabolic Panel:  Recent Labs Lab 02/13/17 1729 02/14/17 0257  NA 141 142  K 4.8 3.6  CL 108 106  CO2 22 25  GLUCOSE 91 126*  BUN 18 14  CREATININE 1.03* 0.89  CALCIUM 8.5* 8.5*    Liver Function Tests:  Recent Labs Lab 02/13/17 1729  AST 72*  ALT 13*  ALKPHOS 120  BILITOT 0.5  PROT 5.8*  ALBUMIN 2.7*   No results for input(s): LIPASE, AMYLASE in the last 168 hours. No results for input(s): AMMONIA in the last 168 hours.  CBC:  Recent Labs Lab 02/13/17 1729 02/14/17 0257  WBC 12.6* 17.0*  NEUTROABS 11.8*  --   HGB 11.2* 12.8  HCT 34.5* 39.3  MCV 91.3 89.3  PLT 363 307    Cardiac Enzymes:  Recent Labs Lab 02/13/17 1729 02/13/17 2135 02/14/17 0257  TROPONINI 1.95* 11.29* 15.17*    BNP: Invalid input(s): POCBNP  CBG:  Recent Labs Lab 02/13/17 1720 02/13/17 2051  GLUCAP 125*  89    Microbiology: Results for orders placed or performed during the hospital encounter of 02/13/17  Blood Culture (routine x 2)     Status: None (Preliminary result)   Collection Time: 02/13/17  5:29 PM  Result Value Ref Range Status   Specimen Description BLOOD LEFT HAND  Final   Special Requests   Final    BOTTLES DRAWN AEROBIC AND ANAEROBIC Blood Culture adequate volume   Culture NO GROWTH < 12 HOURS  Final   Report Status PENDING  Incomplete  Blood Culture (routine x 2)     Status: None (Preliminary result)   Collection Time: 02/13/17  5:29 PM  Result Value Ref Range Status   Specimen Description BLOOD RIGHT HAND  Final   Special Requests   Final    BOTTLES DRAWN AEROBIC AND ANAEROBIC Blood Culture adequate volume   Culture NO GROWTH < 12 HOURS  Final   Report Status PENDING  Incomplete  MRSA PCR Screening     Status: None   Collection Time: 02/14/17  1:23 AM  Result Value Ref Range Status   MRSA by PCR NEGATIVE NEGATIVE Final    Comment:        The GeneXpert MRSA Assay  (FDA approved for NASAL specimens only), is one component of a comprehensive MRSA colonization surveillance program. It is not intended to diagnose MRSA infection nor to guide or monitor treatment for MRSA infections.     Coagulation Studies:  Recent Labs  02/13/17 1729  LABPROT 15.7*  INR 1.26    Urinalysis:  Recent Labs Lab 02/13/17 1729  COLORURINE YELLOW*  LABSPEC 1.012  PHURINE 5.0  GLUCOSEU 150*  HGBUR LARGE*  BILIRUBINUR NEGATIVE  KETONESUR NEGATIVE  PROTEINUR 100*  NITRITE NEGATIVE  LEUKOCYTESUR LARGE*    Lipid Panel:  No results found for: CHOL, TRIG, HDL, CHOLHDL, VLDL, LDLCALC  HgbA1C:  Lab Results  Component Value Date   HGBA1C 5.1 02/13/2017    Urine Drug Screen:     Component Value Date/Time   LABOPIA NONE DETECTED 02/13/2017 1729   COCAINSCRNUR NONE DETECTED 02/13/2017 1729   LABBENZ NONE DETECTED 02/13/2017 1729   AMPHETMU NONE DETECTED 02/13/2017 1729   THCU NONE DETECTED 02/13/2017 1729   LABBARB NONE DETECTED 02/13/2017 1729    Alcohol Level: No results for input(s): ETH in the last 168 hours.  Other results: EKG: sinus tachycardia with frequent PVC's at 107 bpm.  Imaging: Dg Abdomen 1 View  Result Date: 02/13/2017 CLINICAL DATA:  81 y/o  F; orogastric tube placement. EXAM: ABDOMEN - 1 VIEW COMPARISON:  07/08/2016 CT abdomen and pelvis. FINDINGS: Enteric tube tip projects over the gastric body. Aortic calcific atherosclerosis. Moderate levocurvature of the lumbar spine with apex at L3. Surgical clips and sutures project over the gastroesophageal junction and left upper quadrant. Normal bowel gas pattern. Left kidney lower pole renal calculi as seen on prior CT. IMPRESSION: Enteric tube tip projects over the gastric body. Electronically Signed   By: Kristine Garbe M.D.   On: 02/13/2017 18:12   Ct Head Wo Contrast  Result Date: 02/13/2017 CLINICAL DATA:  81 y/o  F; high fever an active seizure on arrival. EXAM: CT HEAD  WITHOUT CONTRAST TECHNIQUE: Contiguous axial images were obtained from the base of the skull through the vertex without intravenous contrast. COMPARISON:  07/08/2016 CT head FINDINGS: Brain: No evidence of acute infarction, hemorrhage, hydrocephalus, extra-axial collection or new mass lesion/mass effect. Stable right parietal region dural dense mass measuring 21 mm in axial  plane compatible with meningioma. Stable chronic microvascular ischemic changes and parenchymal volume loss of the brain. Small stable chronic infarction of left lateral cerebellar hemisphere. Vascular: Calcific atherosclerosis of carotid siphons. No hyperdense vessel. Skull: Normal. Negative for fracture or focal lesion. Sinuses/Orbits: No acute finding. Other: None. IMPRESSION: 1. No acute intracranial abnormality identified. 2. Stable right parietal meningioma. 3. Stable chronic microvascular ischemic changes and parenchymal volume loss of the brain. Electronically Signed   By: Kristine Garbe M.D.   On: 02/13/2017 18:41   Dg Chest Port 1 View  Result Date: 02/13/2017 CLINICAL DATA:  81 y/o F; code sepsis, post intubation, evaluate for pneumonia or pneumothorax. Patient actively season. EXAM: PORTABLE CHEST 1 VIEW COMPARISON:  07/08/2016 chest CT and chest radiograph. FINDINGS: Endotracheal tube 2.8 cm from the carina. Enteric tube extends below the field of view into the abdomen. Surgical clip at the gastroesophageal junction. Stable normal cardiac silhouette. Aortic calcific atherosclerosis. No focal consolidation, effusion, or pneumothorax. Mild stable biapical pleuroparenchymal scarring. IMPRESSION: Endotracheal tube 2.8 cm from carina. Enteric tube tip below field of view in the abdomen. No acute pulmonary process. Electronically Signed   By: Kristine Garbe M.D.   On: 02/13/2017 18:11     Assessment/Plan: 81 year old female presenting in status epilepticus.  Currently intubated and sedated.  On Keppra.   Etiology of seizures unclear.  White blood cell count is elevated and patient is febrile.  Source of infection unclear.  Head CT reviewed and shows no acute changes.  Further work up recommended.    Recommendations: 1.  LP indicated.  Patient on Eliquis at home.  Would not be able to perform LP for at least 48 hours from last dose. Would therefore place patient on broad spectrum antibiotics with CNS coverage and include Acyclovir.  2.  EEG today 3.  MRI of the brain with and without contrast 4.  Continue Keppra at current dose  Case discussed with Dr. Tilman Neat, MD Neurology [phone] 02/14/2017, 10:27 AM

## 2017-02-14 NOTE — Progress Notes (Signed)
Chaplain received an order to visit with pt in [place]. Chaplain arrived and pt was unconscious. Chaplain visited with family and provided emotional support. Doctors are still trying to figure out what is wrong with the pt. Chaplain provided prayer.    02/14/17 0939  Clinical Encounter Type  Visited With Patient;Patient and family together  Visit Type Initial;Spiritual support  Referral From Nurse  Consult/Referral To Chaplain  Spiritual Encounters  Spiritual Needs Emotional;Other (Comment)

## 2017-02-14 NOTE — Progress Notes (Signed)
Pharmacy Antibiotic Note  Melissa Cochran is a 81 y.o. female admitted on 02/13/2017 with status epilepticus. Treating for meningitis/encephalitis.  Pharmacy has been consulted for ampicillin, ceftriaxone, vancomycin, and acyclovir dosing.  Plan: Patient received vancomycin 1g IV x 1 on 10/7. Will continue vancomycin 1g IV Q18hr for goal trough of 15-20. Will obtain trough prior to 5th dose of vancomycin.  Ampicillin 2 g IV Q 4 hours. Ceftriaxone 2 g IV Q 12 hours. Patient previously ordered ceftriaxone 1g IV Q24hr for ?UTI, will order additional ceftriaxone 1 g IV X 1.  Acyclovir 650 mg IV Q 8 hours.  Will monitor renal function daily.   Height: 5\' 5"  (165.1 cm) Weight: 150 lb (68 kg) IBW/kg (Calculated) : 57  Temp (24hrs), Avg:100.1 F (37.8 C), Min:97.8 F (36.6 C), Max:102.6 F (39.2 C)   Recent Labs Lab 02/13/17 1729 02/13/17 2135 02/14/17 0257  WBC 12.6*  --  17.0*  CREATININE 1.03*  --  0.89  LATICACIDVEN 5.8* 3.6*  --     Estimated Creatinine Clearance: 46.1 mL/min (by C-G formula based on SCr of 0.89 mg/dL).    No Known Allergies  Antimicrobials this admission: Zosyn 10/7 >> 10/7 Ceftriaxone 10/7 >>  Vancomycin 10/7 >>  Ampicillin 10/8 >> Acyclovir 10/8 >>  Dose adjustments this admission: 10/8 Ceftriaxone increased to 2g IV Q12hr.   Microbiology results: 10/7 BCx: no growth <12 hours 10/7 UCx: sent  10/8 MRSA PCR: negative  Thank you for allowing pharmacy to be a part of this patient's care.  Oralia Manis 02/14/2017 11:11 AM

## 2017-02-14 NOTE — Consult Note (Signed)
[facility]  CARDIOLOGY CONSULT NOTE  Patient ID: Melissa Cochran MRN: [id number] DOB/AGE: August 23, 1935 81 y.o.  Admit date: 02/13/2017 Referring Physician Dr. Anselm Jungling Primary Physician Dr. Otilio Miu Primary Cardiologist Dr. Saralyn Pilar Reason for Consultation elevated troponin  HPI: Pt is a 81 yo female with history of cad s/p pci of mid left circumflex in 2011 with  cath in 10/21/15 which revealed a patent stent in the lcx and high grade om1 stenosis which was treated with a bms. She is now admitted with status epilepticus. She was treated with a trasurethral resection of a bladder tumor 9/17 and did well. She had some altered mental status at that time.Her ef was  Normal at 55% by echo in 2017. She presented to the er after being noted to be unresponsive and having a seizure  She was intubated for persistent seizure activity. She was on eliquis for paroxysmal afib. Head ct showed a meningioma which was stable form 3/18. There was no bleed. EKG showed sinus tach with no injury. Initial troponin was 1.95 with subsequent troponin rising to 15.17. Not able to give history due to sedation and intubation. Electrolytes and renal funcitin normal.   Review of Systems  Unable to perform ROS: Intubated    Past Medical History:  Diagnosis Date  . Adnexal mass   . Anxiety   . Arthritis   . Bladder tumor   . Bleeding ulcer   . CHF (congestive heart failure) ([place])   . CHF (congestive heart failure) ([place])   . COPD (chronic obstructive pulmonary disease) ([place])   . Coronary artery disease   . GERD (gastroesophageal reflux disease)   . Heart block    left  . History of bleeding ulcers   . Hyperlipemia   . Hypertension   . Pneumonia   . Shortness of breath dyspnea     Family History  Problem Relation Age of Onset  . Stomach cancer Mother   . CVA Father   . Bladder Cancer Neg Hx   . Kidney cancer Neg Hx     Social History   Social  History  . Marital status: Widowed    Spouse name: N/A  . Number of children: N/A  . Years of education: N/A   Occupational History  . Not on file.   Social History Main Topics  . Smoking status: Former Smoker    Packs/day: 1.00    Years: 40.00    Quit date: 06/13/1998  . Smokeless tobacco: Never Used  . Alcohol use No  . Drug use: No  . Sexual activity: Not Currently   Other Topics Concern  . Not on file   Social History Narrative  . No narrative on file    Past Surgical History:  Procedure Laterality Date  . ABDOMINAL HYSTERECTOMY    . CARDIAC CATHETERIZATION Left 10/21/2015   Procedure: Left Heart Cath and Coronary Angiography;  Surgeon: Isaias Cowman, MD;  Location: [place] [facility];  Service: Cardiovascular;  Laterality: Left;  . CARDIAC CATHETERIZATION N/A 10/21/2015   Procedure: Coronary Stent Intervention;  Surgeon: Isaias Cowman, MD;  Location: [place] [facility];  Service: Cardiovascular;  Laterality: N/A;  . CARDIAC SURGERY     cardiac cath  . CYSTOSCOPY W/ RETROGRADES Bilateral 01/20/2016   Procedure: CYSTOSCOPY WITH RETROGRADE PYELOGRAM;  Surgeon: Hollice Espy, MD;  Location: [facility];  Service: Urology;  Laterality: Bilateral;  . CYSTOSCOPY WITH STENT PLACEMENT Right 01/20/2016   Procedure:  CYSTOSCOPY WITH STENT PLACEMENT;  Surgeon: Hollice Espy, MD;  Location: [facility];  Service: Urology;  Laterality: Right;  . HIP SURGERY     left  . STOMACH SURGERY    . TRANSURETHRAL RESECTION OF BLADDER TUMOR N/A 01/20/2016   Procedure: TRANSURETHRAL RESECTION OF BLADDER TUMOR (TURBT);  Surgeon: Hollice Espy, MD;  Location: [facility];  Service: Urology;  Laterality: N/A;     Facility-Administered Medications Prior to Admission  Medication Dose Route Frequency Provider Last Rate Last Dose  . apixaban (ELIQUIS) tablet 5 mg  5 mg Oral BID Algernon Huxley, MD      . traMADol Veatrice Bourbon) tablet 50 mg  50 mg Oral Q6H PRN Algernon Huxley, MD       Prescriptions  Prior to Admission  Medication Sig Dispense Refill Last Dose  . clopidogrel (PLAVIX) 75 MG tablet TAKE 1 TABLET BY MOUTH ONCE DAILY 30 tablet 0 unknown at unknown  . metoprolol (LOPRESSOR) 100 MG tablet Take 1 tablet (100 mg total) by mouth 2 (two) times daily. (Patient taking differently: Take 100 mg by mouth daily. ) 60 tablet 0 unknown at unknown  . apixaban (ELIQUIS) 5 MG TABS tablet Take 5 mg by mouth 2 (two) times daily.   unknown at unknown  . docusate sodium (COLACE) 100 MG capsule Take 1 capsule (100 mg total) by mouth 2 (two) times daily. 60 capsule 0 unknown at unknown  . traMADol (ULTRAM) 50 MG tablet Take 100 mg by mouth every 6 (six) hours as needed.   unknown at unknown    Physical Exam: Blood pressure (!) 164/78, pulse (!) 104, temperature 99.2 F (37.3 C), temperature source Oral, resp. rate (!) 7, height 5\' 5"  (1.651 m), weight 68 kg (150 lb), SpO2 100 %.   Wt Readings from Last 1 Encounters:  02/13/17 68 kg (150 lb)     General appearance: intubated Resp: clear to auscultation bilaterally Cardio: regular rate and rhythm GI: no masses Extremities: extremities normal, atraumatic, no cyanosis or edema Neurologic: Mental status: intubated and sedated  Labs:   Lab Results  Component Value Date   WBC 17.0 (H) Aug 16, 202018   HGB 12.8 Aug 16, 202018   HCT 39.3 Aug 16, 202018   MCV 89.3 Aug 16, 202018   PLT 307 Aug 16, 202018    Recent Labs Lab 02/13/17 1729 02/14/17 0257  NA 141 142  K 4.8 3.6  CL 108 106  CO2 22 25  BUN 18 14  CREATININE 1.03* 0.89  CALCIUM 8.5* 8.5*  PROT 5.8*  --   BILITOT 0.5  --   ALKPHOS 120  --   ALT 13*  --   AST 72*  --   GLUCOSE 91 126*   Lab Results  Component Value Date   TROPONINI 15.17 ([place]) Aug 16, 202018      Radiology:  EKG: sinus tach  ASSESSMENT AND PLAN:  Pt with history of cad s/p pci of lcx with a des in 2011 and om1 in 2017 with a bms on plavix and eliquis as an outpatient who was admitted with a seizure. No previous seizures. No  bleed in brain. Has elevated serum troponin. No ischemia on ekg. Elevated tropnin may be secondary to seizure activity vs ischemia. If ok from neurology would resume plavix and consider heparin. Pending extubation, will consider further evaluation of pts coronary anatomy.  Signed: Teodoro Spray MD, [facility] 02/14/2017, 8:41 AM

## 2017-02-14 NOTE — Progress Notes (Signed)
*  PRELIMINARY RESULTS* Echocardiogram 2D Echocardiogram has been performed.  Sherrie Sport 02/14/2017, 2:18 PM

## 2017-02-14 NOTE — Progress Notes (Signed)
Notified Dr. Mortimer Fries via phone about patient's 5 beat run of VT. MD reviewed patient's clinical condition and lab values and ordered 2g of magnesium IVPB. Team will continue to monitor.

## 2017-02-14 NOTE — Progress Notes (Signed)
Initial Nutrition Assessment  DOCUMENTATION CODES:   Not applicable  INTERVENTION:  Recommend initiating Vital 1.5 at 40 ml/hr + Pro-Stat 30 ml BID via OGT. Provides 1640 kcal, 95 grams of protein, 730 ml H2O daily.  Recommend free water flush of 175 ml Q6 hrs. Provides 1430 ml H2O daily including water in tube feeding (not including flushes provided with medications/Pro-Stat).  Provide liquid multivitamin with minerals per tube daily.  NUTRITION DIAGNOSIS:   Inadequate oral intake related to inability to eat as evidenced by NPO status.  GOAL:   Provide needs based on ASPEN/SCCM guidelines  MONITOR:   Vent status, Labs, Weight trends, TF tolerance, I & O's, Skin  REASON FOR ASSESSMENT:   Ventilator    ASSESSMENT:   81 year old female with PMHx of HTN, HLD, heart block, adnexal mass, bleeding ulcer, CHF, CAD, COPD, GERD, anxiety who presented with unresponsiveness and status epilepticus and was intubated on 10/7 for airway protection.   -Plan is for MRI of brain and EEG today. LP tomorrow.  No family at bedside during assessment. Per review of weight history in chart patient has been 150-156 lbs the past year.  Access: 18 Fr. OGT placed 10/7; verified to terminate in gastric body per abdominal x-ray 10/7; 53 cm at corner of mouth  MAP: 97-116 mmHg  Patient is currently intubated on ventilator support MV: 7.5 L/min Temp (24hrs), Avg:100.1 F (37.8 C), Min:97.8 F (36.6 C), Max:102.6 F (39.2 C)  Propofol: N/A  Medications reviewed and include: pantoprazole, acyclovir, ampicillin, ceftriaxone, Keppra, Versed gtt, vancomycin.  Labs reviewed: CBG 89, Troponin 15.17, Lactic Acid 3.6, HgbA1c 5.1.  Nutrition-Focused physical exam completed. Findings are no fat depletion, no muscle depletion, and no edema.   Discussed with RN. Also discussed on rounds. Plan is to initiate tube feeds today.  Diet Order:  Diet NPO time specified  Skin:  Wound (see comment) (small red  round areas on right medial upper thigh)  Last BM:  Unknown  Height:   Ht Readings from Last 1 Encounters:  02/13/17 5\' 5"  (1.651 m)    Weight:   Wt Readings from Last 1 Encounters:  02/13/17 150 lb (68 kg)    Ideal Body Weight:  56.8 kg  BMI:  Body mass index is 24.96 kg/m.  Estimated Nutritional Needs:   Kcal:  1682 (PSU 2003b w/ MSJ 1162, Ve 7.5, Tmax 39.2)  Protein:  82-95 grams (1.2-1.4 grams/kg)  Fluid:  1.7 L/day (25 ml/kg)  EDUCATION NEEDS:   No education needs identified at this time  Willey Blade, [place], [place], LDN Office: [phone] Pager: [phone] After Hours/Weekend Pager: [phone]

## 2017-02-15 ENCOUNTER — Inpatient Hospital Stay: Payer: Medicare Other

## 2017-02-15 DIAGNOSIS — J9601 Acute respiratory failure with hypoxia: Secondary | ICD-10-CM

## 2017-02-15 LAB — CSF CELL COUNT WITH DIFFERENTIAL
EOS CSF: 0 %
LYMPHS CSF: 0 %
MONOCYTE-MACROPHAGE-SPINAL FLUID: 1 %
OTHER CELLS CSF: 0
RBC COUNT CSF: 5894 /mm3 — AB (ref 0–3)
Segmented Neutrophils-CSF: 99 %
Tube #: 1
WBC CSF: 165 /mm3 — AB (ref 0–5)

## 2017-02-15 LAB — MAGNESIUM
MAGNESIUM: 2.2 mg/dL (ref 1.7–2.4)
Magnesium: 2.2 mg/dL (ref 1.7–2.4)

## 2017-02-15 LAB — PHOSPHORUS
PHOSPHORUS: 2.1 mg/dL — AB (ref 2.5–4.6)
Phosphorus: 1.8 mg/dL — ABNORMAL LOW (ref 2.5–4.6)

## 2017-02-15 LAB — BLOOD CULTURE ID PANEL (REFLEXED)
Acinetobacter baumannii: NOT DETECTED
CANDIDA ALBICANS: NOT DETECTED
CANDIDA PARAPSILOSIS: NOT DETECTED
CANDIDA TROPICALIS: NOT DETECTED
Candida glabrata: NOT DETECTED
Candida krusei: NOT DETECTED
ENTEROBACTERIACEAE SPECIES: NOT DETECTED
ENTEROCOCCUS SPECIES: NOT DETECTED
Enterobacter cloacae complex: NOT DETECTED
Escherichia coli: NOT DETECTED
HAEMOPHILUS INFLUENZAE: NOT DETECTED
KLEBSIELLA OXYTOCA: NOT DETECTED
KLEBSIELLA PNEUMONIAE: NOT DETECTED
Listeria monocytogenes: NOT DETECTED
NEISSERIA MENINGITIDIS: NOT DETECTED
Proteus species: NOT DETECTED
Pseudomonas aeruginosa: NOT DETECTED
SERRATIA MARCESCENS: NOT DETECTED
STAPHYLOCOCCUS AUREUS BCID: NOT DETECTED
STREPTOCOCCUS AGALACTIAE: NOT DETECTED
STREPTOCOCCUS SPECIES: NOT DETECTED
Staphylococcus species: NOT DETECTED
Streptococcus pneumoniae: NOT DETECTED
Streptococcus pyogenes: NOT DETECTED

## 2017-02-15 LAB — GLUCOSE, CAPILLARY
GLUCOSE-CAPILLARY: 148 mg/dL — AB (ref 65–99)
Glucose-Capillary: 170 mg/dL — ABNORMAL HIGH (ref 65–99)
Glucose-Capillary: 136 mg/dL — ABNORMAL HIGH (ref 65–99)
Glucose-Capillary: 128 mg/dL — ABNORMAL HIGH (ref 65–99)
Glucose-Capillary: 150 mg/dL — ABNORMAL HIGH (ref 65–99)

## 2017-02-15 LAB — PROTEIN AND GLUCOSE, CSF
Glucose, CSF: 85 mg/dL — ABNORMAL HIGH (ref 40–70)
Total  Protein, CSF: 30 mg/dL (ref 15–45)

## 2017-02-15 LAB — BASIC METABOLIC PANEL
Anion gap: 7 (ref 5–15)
BUN: 21 mg/dL — AB (ref 6–20)
CHLORIDE: 104 mmol/L (ref 101–111)
CO2: 27 mmol/L (ref 22–32)
CREATININE: 0.7 mg/dL (ref 0.44–1.00)
Calcium: 8.7 mg/dL — ABNORMAL LOW (ref 8.9–10.3)
GFR calc Af Amer: >60 mL/min (ref >60)
GFR calc non Af Amer: >60 mL/min (ref >60)
Glucose, Bld: 174 mg/dL — ABNORMAL HIGH (ref 65–99)
POTASSIUM: 3.4 mmol/L — AB (ref 3.5–5.1)
Sodium: 138 mmol/L (ref 135–145)

## 2017-02-15 LAB — PATHOLOGIST SMEAR REVIEW

## 2017-02-15 LAB — APTT: aPTT: 30 seconds (ref 24–36)

## 2017-02-15 LAB — PROTIME-INR
INR: 1.27
PROTHROMBIN TIME: 15.8 s — AB (ref 11.4–15.2)

## 2017-02-15 MED ORDER — POTASSIUM PHOSPHATES 15 MMOLE/5ML IV SOLN
20.0000 mmol | Freq: Once | INTRAVENOUS | Status: AC
  Administered 2017-02-15: 20 mmol via INTRAVENOUS
  Filled 2017-02-15: qty 6.67

## 2017-02-15 MED ORDER — POTASSIUM & SODIUM PHOSPHATES 280-160-250 MG PO PACK
1.0000 | PACK | Freq: Four times a day (QID) | ORAL | Status: AC
  Administered 2017-02-15 – 2017-02-16 (×4): 1 via ORAL
  Filled 2017-02-15 (×4): qty 1

## 2017-02-15 MED ORDER — SENNOSIDES-DOCUSATE SODIUM 8.6-50 MG PO TABS
1.0000 | ORAL_TABLET | Freq: Two times a day (BID) | ORAL | Status: DC
  Administered 2017-02-15 – 2017-02-22 (×7): 1
  Filled 2017-02-15 (×8): qty 1

## 2017-02-15 NOTE — H&P (Signed)
PATIENT NAME: Melissa Cochran    MR#:  [id number]  DATE OF BIRTH:  Sep 29, 1935  DATE OF ADMISSION:  02/13/2017  PRIMARY CARE PHYSICIAN: Teodoro Spray, MD   REQUESTING/REFERRING PHYSICIAN: Merlyn Lot, MD  CHIEF COMPLAINT:   Chief Complaint  Patient presents with  . Seizures   Unresponsiveness and the seizure activity today. HISTORY OF PRESENT ILLNESS:  Melissa Cochran  is a 81 y.o. female with a known history of Hypertension, CHF, COPD, hyperlipidemia and CAD. The patient was sent to ED due to unresponsiveness and seizure today. On arrival to the ER patient was placed on nonrebreather followed by bag-valve-mask. She was intubated in the ED due to persistent seizing for about 20 minutes. She was given Ativan followed by Versed without any improvement. Due to her agonal respirations patient was intubated for airway protection. She is on the sepsis and UTI, given Zosyn in the ED. CT of the head show meningioma, which is stable.  Patient with +CNS infection Multiple abx Poor prognosis Remains critically ill On full vent support  PAST MEDICAL HISTORY:   Past Medical History:  Diagnosis Date  . Adnexal mass   . Anxiety   . Arthritis   . Bladder tumor   . Bleeding ulcer   . CHF (congestive heart failure) ([place])   . CHF (congestive heart failure) ([place])   . COPD (chronic obstructive pulmonary disease) ([place])   . Coronary artery disease   . GERD (gastroesophageal reflux disease)   . Heart block    left  . History of bleeding ulcers   . Hyperlipemia   . Hypertension   . Pneumonia   . Shortness of breath dyspnea     PAST SURGICAL HISTORY:   Past Surgical History:  Procedure Laterality Date  . ABDOMINAL HYSTERECTOMY    . CARDIAC CATHETERIZATION Left 10/21/2015   Procedure: Left Heart Cath and Coronary Angiography;  Surgeon: Isaias Cowman, MD;  Location: [place] [facility];  Service: Cardiovascular;  Laterality: Left;  . CARDIAC CATHETERIZATION N/A 10/21/2015    Procedure: Coronary Stent Intervention;  Surgeon: Isaias Cowman, MD;  Location: [place] [facility];  Service: Cardiovascular;  Laterality: N/A;  . CARDIAC SURGERY     cardiac cath  . CYSTOSCOPY W/ RETROGRADES Bilateral 01/20/2016   Procedure: CYSTOSCOPY WITH RETROGRADE PYELOGRAM;  Surgeon: Hollice Espy, MD;  Location: [facility];  Service: Urology;  Laterality: Bilateral;  . CYSTOSCOPY WITH STENT PLACEMENT Right 01/20/2016   Procedure: CYSTOSCOPY WITH STENT PLACEMENT;  Surgeon: Hollice Espy, MD;  Location: [facility];  Service: Urology;  Laterality: Right;  . HIP SURGERY     left  . STOMACH SURGERY    . TRANSURETHRAL RESECTION OF BLADDER TUMOR N/A 01/20/2016   Procedure: TRANSURETHRAL RESECTION OF BLADDER TUMOR (TURBT);  Surgeon: Hollice Espy, MD;  Location: [facility];  Service: Urology;  Laterality: N/A;    SOCIAL HISTORY:   Social History  Substance Use Topics  . Smoking status: Former Smoker    Packs/day: 1.00    Years: 40.00    Quit date: 06/13/1998  . Smokeless tobacco: Never Used  . Alcohol use No    FAMILY HISTORY:   Family History  Problem Relation Age of Onset  . Stomach cancer Mother   . CVA Father   . Bladder Cancer Neg Hx   . Kidney cancer Neg Hx     DRUG ALLERGIES:  No Known Allergies  REVIEW OF SYSTEMS:   Review of Systems  Unable to perform ROS: Intubated  MEDICATIONS AT HOME:   Prior to Admission medications   Medication Sig Start Date End Date Taking? Authorizing Provider  clopidogrel (PLAVIX) 75 MG tablet TAKE 1 TABLET BY MOUTH ONCE DAILY 01/07/17  Yes [name], MD  metoprolol (LOPRESSOR) 100 MG tablet Take 1 tablet (100 mg total) by mouth 2 (two) times daily. Patient taking differently: Take 100 mg by mouth daily.  10/31/15  Yes [name], MD  apixaban (ELIQUIS) 5 MG TABS tablet Take 5 mg by mouth 2 (two) times daily.    [provider]  docusate sodium (COLACE) 100 MG capsule Take 1 capsule (100 mg total) by mouth 2  (two) times daily. 01/20/16   Hollice Espy, MD  traMADol (ULTRAM) 50 MG tablet Take 100 mg by mouth every 6 (six) hours as needed.    [provider]      VITAL SIGNS:  Blood pressure (!) 151/76, pulse (!) 104, temperature (!) 100.4 F (38 C), temperature source Oral, resp. rate 19, height 5\' 5"  (1.651 m), weight 156 lb 8.4 oz (71 kg), SpO2 100 %.  PHYSICAL EXAMINATION:  Physical Exam  GENERAL:  81 y.o.-year-old patient lying in the bed, on Ventilation.  EYES: Pupils equal, round, reactive to light. No scleral icterus. HEENT: Head atraumatic, normocephalic.  NECK:  Supple, no jugular venous distention.  LUNGS: Normal breath sounds bilaterally, no wheezing, rales,rhonchi or crepitation. CARDIOVASCULAR: S1, S2 normal. No murmurs, rubs, or gallops.  ABDOMEN: Soft, nontender, nondistended. Bowel sounds present. No organomegaly or mass.  EXTREMITIES: No pedal edema, cyanosis, or clubbing.  NEUROLOGIC: Unable to exam.  PSYCHIATRIC: The patient is intubated and sedated. SKIN: No obvious rash, lesion, or ulcer.   LABORATORY PANEL:   CBC  Recent Labs Lab 02/14/17 0257  WBC 17.0*  HGB 12.8  HCT 39.3  PLT 307   ------------------------------------------------------------------------------------------------------------------  Chemistries   Recent Labs Lab 02/13/17 1729  02/15/17 0506  NA 141  < > 138  K 4.8  < > 3.4*  CL 108  < > 104  CO2 22  < > 27  GLUCOSE 91  < > 174*  BUN 18  < > 21*  CREATININE 1.03*  < > 0.70  CALCIUM 8.5*  < > 8.7*  MG  --   < > 2.2  AST 72*  --   --   ALT 13*  --   --   ALKPHOS 120  --   --   BILITOT 0.5  --   --   < > = values in this interval not displayed. ------------------------------------------------------------------------------------------------------------------  Cardiac Enzymes  Recent Labs Lab 02/14/17 0257  TROPONINI 15.17*      IMPRESSION AND PLAN:   81 yo white female with acute encephalopathy from acute  CVA and encephalitis with severe resp failure with status epilepticus  Status epilepticus The patient will be admitted to ICU, continue sedation medication IV, Keppra IV and neurology consult.  CN S infection/CVA Follow up neuro recs Continue IV abx   Acute respiratory failure with hypoxia Continue ventilation and intensivist consult.  Sepsis due to UTI Start sepsis protocol, continue antibiotics, follow-up CBC and cultures. Elevated troponin. Possible due to demanding ischemia secondary to seizure, respiratory failure and sepsis.  Follow-up troponin level and cardiology consult.  Accelerated hypertension. IV hydralazine when necessary. COPD. Stable. Chronic diastolic CHF. Stable.    Critical Care Time devoted to patient care services described in this note is 45 minutes.   Overall, patient is critically ill, prognosis is  guarded.  Patient with Multiorgan failure and at high risk for cardiac arrest and death.    Corrin Parker, M.D.  [facility]  Medical Director [place] Director [facility] Cardio-Pulmonary Department

## 2017-02-15 NOTE — Progress Notes (Signed)
[facility] for electrolyte management   Pharmacy consulted for electrolyte management for 81 yo female ICU patient admitted for AMS, being covered for meningitis, and requiring mechanical ventilation.   Plan:  Potasium phosphate 20 mol IV x 1 and Phos-nak 1 packet q6hr x 4 doses.   Will recheck electrolytes with am labs.   No Known Allergies  Patient Measurements: Height: 5\' 5"  (165.1 cm) Weight: 156 lb 8.4 oz (71 kg) IBW/kg (Calculated) : 57  Vital Signs: Temp: 101.3 F (38.5 C) (10/09 1937) Temp Source: Oral (10/09 1935) BP: 84/49 (10/09 2100) Pulse Rate: 96 (10/09 2100) Intake/Output from previous day: 10/08 0701 - 10/09 0700 In: 2314.6 [I.V.:45.6; NG/GT:1170; IV Piggyback:1099] Out: 1095 [Urine:1095] Intake/Output from this shift: Total I/O In: 525 [I.V.:45; NG/GT:480] Out: -   Labs:  Recent Labs  02/13/17 1729 02/14/17 0257 02/14/17 1330 02/15/17 0506 02/15/17 1027 02/15/17 1735  WBC 12.6* 17.0*  --   --   --   --   HGB 11.2* 12.8  --   --   --   --   HCT 34.5* 39.3  --   --   --   --   PLT 363 307  --   --   --   --   APTT  --   --   --   --  30  --   CREATININE 1.03* 0.89  --  0.70  --   --   MG  --   --  1.9 2.2  --  2.2  PHOS  --   --  3.5 1.8*  --  2.1*  ALBUMIN 2.7*  --   --   --   --   --   PROT 5.8*  --   --   --   --   --   AST 72*  --   --   --   --   --   ALT 13*  --   --   --   --   --   ALKPHOS 120  --   --   --   --   --   BILITOT 0.5  --   --   --   --   --    Estimated Creatinine Clearance: 56.4 mL/min (by C-G formula based on SCr of 0.7 mg/dL).   Medical History: Past Medical History:  Diagnosis Date  . Adnexal mass   . Anxiety   . Arthritis   . Bladder tumor   . Bleeding ulcer   . CHF (congestive heart failure) ([place])   . CHF (congestive heart failure) ([place])   . COPD (chronic obstructive pulmonary disease) ([place])   . Coronary artery disease   . GERD (gastroesophageal reflux disease)    . Heart block    left  . History of bleeding ulcers   . Hyperlipemia   . Hypertension   . Pneumonia   . Shortness of breath dyspnea     Pharmacy will continue to monitor and adjust per consult.   Maile Linford L 02/15/2017,9:48 PM

## 2017-02-15 NOTE — Procedures (Addendum)
LP Procedure Note:  Patient has been seen and examined.  Chart has been reviewed.  LP is being performed to rule out encephalitis.  Procedure has been explained to family including risks and benefits.  Consent has been signed by family and witnessed. A time out was performed.  Blood pressure 126/71, pulse 99, temperature 98.6 F (37 C), temperature source Oral, resp. rate (!) 23, height _0  (1.651 m), weight 71 kg (156 lb 8.4 oz), SpO2 99 %.   Current Facility-Administered Medications:  .  acetaminophen (TYLENOL) tablet 650 mg, 650 mg, Oral, Q6H PRN, 650 mg at 02/14/17 0910 **OR** acetaminophen (TYLENOL) suppository 650 mg, 650 mg, Rectal, Q6H PRN, Demetrios Loll, MD, 650 mg at 02/15/17 0009 .  acyclovir (ZOVIRAX) 650 mg in dextrose 5 % 100 mL IVPB, 650 mg, Intravenous, Q8H, Vaughan Basta, MD, Stopped at 02/15/17 1226 .  albuterol (PROVENTIL) (2.5 MG/3ML) 0.083% nebulizer solution 2.5 mg, 2.5 mg, Nebulization, Q2H PRN, Demetrios Loll, MD .  ampicillin (OMNIPEN) 2 g in sodium chloride 0.9 % 50 mL IVPB, 2 g, Intravenous, Q4H, Vaughan Basta, MD, Last Rate: 150 mL/hr at 02/15/17 1226, 2 g at 02/15/17 1226 .  cefTRIAXone (ROCEPHIN) 2 g in dextrose 5 % 50 mL IVPB, 2 g, Intravenous, Q12H, Vaughan Basta, MD, Stopped at 02/15/17 1039 .  chlorhexidine gluconate (MEDLINE KIT) (PERIDEX) 0.12 % solution 15 mL, 15 mL, Mouth Rinse, BID, Tukov, Magadalene S, NP, 15 mL at 02/15/17 0755 .  diltiazem (CARDIZEM) 100 mg in dextrose 5 % 100 mL (1 mg/mL) infusion, 5-15 mg/hr, Intravenous, Titrated, Blakeney, Dreama Saa, NP, Last Rate: 15 mL/hr at 02/15/17 0933, 15 mg/hr at 02/15/17 0933 .  feeding supplement (PRO-STAT SUGAR FREE 64) liquid 30 mL, 30 mL, Per Tube, BID, Kasa, Kurian, MD, 30 mL at 02/15/17 1009 .  feeding supplement (VITAL 1.5 CAL) liquid 1,000 mL, 1,000 mL, Per Tube, Q24H, Kasa, Kurian, MD, Last Rate: 40 mL/hr at 02/15/17 1200, 1,000 mL at 02/15/17 1200 .  free water 175 mL, 175 mL, Per  Tube, Q6H, Kasa, Kurian, MD, 175 mL at 02/15/17 1200 .  hydrALAZINE (APRESOLINE) injection 10 mg, 10 mg, Intravenous, Q6H PRN, Demetrios Loll, MD, 10 mg at 02/14/17 0550 .  levETIRAcetam (KEPPRA) 1,000 mg in sodium chloride 0.9 % 100 mL IVPB, 1,000 mg, Intravenous, Q12H, Alexis Goodell, MD, Last Rate: 440 mL/hr at 02/15/17 1227, 1,000 mg at 02/15/17 1227 .  MEDLINE mouth rinse, 15 mL, Mouth Rinse, 10 times per day, Tukov, Magadalene S, NP, 15 mL at 02/15/17 1227 .  metoprolol tartrate (LOPRESSOR) injection 5 mg, 5 mg, Intravenous, Q6H PRN, Awilda Bill, NP .  midazolam (VERSED) 50 mg in sodium chloride 0.9 % 50 mL (1 mg/mL) infusion, 0.5 mg/hr, Intravenous, Continuous, Merlyn Lot, MD, Stopped at 02/14/17 0800 .  multivitamin liquid 15 mL, 15 mL, Per Tube, Daily, Kasa, Kurian, MD, 15 mL at 02/15/17 1009 .  ondansetron (ZOFRAN) tablet 4 mg, 4 mg, Oral, Q6H PRN **OR** ondansetron (ZOFRAN) injection 4 mg, 4 mg, Intravenous, Q6H PRN, Demetrios Loll, MD .  pantoprazole (PROTONIX) injection 40 mg, 40 mg, Intravenous, Q24H, Anders Simmonds, MD, 40 mg at 02/14/17 2355 .  potassium PHOSPHATE 20 mmol in dextrose 5 % 500 mL infusion, 20 mmol, Intravenous, Once, Vaughan Basta, MD, Last Rate: 84 mL/hr at 02/15/17 0744, 20 mmol at 02/15/17 0744 .  vancomycin (VANCOCIN) IVPB 1000 mg/200 mL premix, 1,000 mg, Intravenous, Q18H, Vaughan Basta, MD, Stopped at 02/15/17 1109   Recent Labs  02/13/17 1729 02/14/17 0257 02/15/17 1027  WBC 12.6* 17.0*  --   HGB 11.2* 12.8  --   HCT 34.5* 39.3  --   PLT 363 307  --   INR 1.26  --  1.27    CT of head:  Patient was placed in the lateral decub position.  Area was cleaned with betadine.  Under sterile conditions 20G LP needle was placed at approximately L3-4 without difficulty.  Opening pressure was documented at 20.  Approximately 12cc of fluid that was clear initially but became blood tinged by the latter tubes was obtained and sent for  studies.   Due to blood at end of procedure will perform follow up lumbar CT scan.  Discussed with Dr. Tilman Neat, MD Neurology [phone] 02/15/2017  12:47 PM

## 2017-02-15 NOTE — Progress Notes (Addendum)
Subjective: Patient remains intubated and unresponsive.    Objective: Current vital signs: BP 126/71   Pulse 99   Temp 98.6 F (37 C) (Oral)   Resp (!) 23   Ht '5\' 5"'$  (1.651 m)   Wt 71 kg (156 lb 8.4 oz)   SpO2 99%   BMI 26.05 kg/m  Vital signs in last 24 hours: Temp:  [97.9 F (36.6 C)-101.6 F (38.7 C)] 98.6 F (37 C) (10/09 0800) Pulse Rate:  [90-128] 99 (10/09 1100) Resp:  [14-24] 23 (10/09 1100) BP: (126-181)/(57-98) 126/71 (10/09 1100) SpO2:  [98 %-100 %] 99 % (10/09 1100) FiO2 (%):  [28 %-35 %] 28 % (10/09 1144) Weight:  [71 kg (156 lb 8.4 oz)] 71 kg (156 lb 8.4 oz) (10/09 0500)  Intake/Output from previous day: 10/08 0701 - 10/09 0700 In: 2314.6 [I.V.:45.6; NG/GT:1170; IV Piggyback:1099] Out: 1095 [Urine:1095] Intake/Output this shift: Total I/O In: 1122.3 [I.V.:62.3; NG/GT:160; IV Piggyback:900] Out: 150 [Urine:150] Nutritional status: Diet NPO time specified  Neurologic Exam: Mental Status: Patient does not respond to verbal stimuli.  Does not respond to deep sternal rub.  Does not follow commands.  No verbalizations noted.  Cranial Nerves: II: patient does not respond confrontation bilaterally, pupils right 3 mm, left 3 mm,and sluggishly reactive bilaterally III,IV,VI: Absent oculocephalic maneuvers bilaterally.  V,VII: corneal reflex present bilaterally  VIII: patient does not respond to verbal stimuli IX,X: gag reflex unable to test, XI: trapezius strength unable to test bilaterally XII: tongue strength unable to test Motor: No spontaneous movement noted Sensory: Does not respond to noxious stimuli in any extremity. Plantars: Upgoing on the left Cerebellar: Unable to perform  Lab Results: Basic Metabolic Panel:  Recent Labs Lab 02/13/17 1729 02/14/17 0257 02/14/17 1330 02/15/17 0506  NA 141 142  --  138  K 4.8 3.6  --  3.4*  CL 108 106  --  104  CO2 22 25  --  27  GLUCOSE 91 126*  --  174*  BUN 18 14  --  21*  CREATININE 1.03* 0.89   --  0.70  CALCIUM 8.5* 8.5*  --  8.7*  MG  --   --  1.9 2.2  PHOS  --   --  3.5 1.8*    Liver Function Tests:  Recent Labs Lab 02/13/17 1729  AST 72*  ALT 13*  ALKPHOS 120  BILITOT 0.5  PROT 5.8*  ALBUMIN 2.7*   No results for input(s): LIPASE, AMYLASE in the last 168 hours. No results for input(s): AMMONIA in the last 168 hours.  CBC:  Recent Labs Lab 02/13/17 1729 02/14/17 0257  WBC 12.6* 17.0*  NEUTROABS 11.8*  --   HGB 11.2* 12.8  HCT 34.5* 39.3  MCV 91.3 89.3  PLT 363 307    Cardiac Enzymes:  Recent Labs Lab 02/13/17 1729 02/13/17 2135 02/14/17 0257  TROPONINI 1.95* 11.29* 15.17*    Lipid Panel: No results for input(s): CHOL, TRIG, HDL, CHOLHDL, VLDL, LDLCALC in the last 168 hours.  CBG:  Recent Labs Lab 02/14/17 1616 02/14/17 1951 02/14/17 2332 02/15/17 0410 02/15/17 0729  GLUCAP 130* 174* 161* 170* 148*    Microbiology: Results for orders placed or performed during the hospital encounter of 02/13/17  Blood Culture (routine x 2)     Status: None (Preliminary result)   Collection Time: 02/13/17  5:29 PM  Result Value Ref Range Status   Specimen Description BLOOD LEFT HAND  Final   Special Requests   Final  BOTTLES DRAWN AEROBIC AND ANAEROBIC Blood Culture adequate volume   Culture NO GROWTH 2 DAYS  Final   Report Status PENDING  Incomplete  Blood Culture (routine x 2)     Status: None (Preliminary result)   Collection Time: 02/13/17  5:29 PM  Result Value Ref Range Status   Specimen Description BLOOD RIGHT HAND  Final   Special Requests   Final    BOTTLES DRAWN AEROBIC AND ANAEROBIC Blood Culture adequate volume   Culture  Setup Time   Final    Organism ID to follow GRAM POSITIVE COCCI AEROBIC BOTTLE ONLY CRITICAL RESULT CALLED TO, READ BACK BY AND VERIFIED WITH: GARRETT COFFEE AT 1203 02/15/17 SDR    Culture GRAM POSITIVE COCCI  Final   Report Status PENDING  Incomplete  Urine culture     Status: Abnormal (Preliminary result)    Collection Time: 02/13/17  5:29 PM  Result Value Ref Range Status   Specimen Description URINE, RANDOM  Final   Special Requests NONE  Final   Culture >=100,000 COLONIES/mL GRAM NEGATIVE RODS (A)  Final   Report Status PENDING  Incomplete  Blood Culture ID Panel (Reflexed)     Status: None   Collection Time: 02/13/17  5:29 PM  Result Value Ref Range Status   Enterococcus species NOT DETECTED NOT DETECTED Final   Listeria monocytogenes NOT DETECTED NOT DETECTED Final   Staphylococcus species NOT DETECTED NOT DETECTED Final   Staphylococcus aureus NOT DETECTED NOT DETECTED Final   Streptococcus species NOT DETECTED NOT DETECTED Final   Streptococcus agalactiae NOT DETECTED NOT DETECTED Final   Streptococcus pneumoniae NOT DETECTED NOT DETECTED Final   Streptococcus pyogenes NOT DETECTED NOT DETECTED Final   Acinetobacter baumannii NOT DETECTED NOT DETECTED Final   Enterobacteriaceae species NOT DETECTED NOT DETECTED Final   Enterobacter cloacae complex NOT DETECTED NOT DETECTED Final   Escherichia coli NOT DETECTED NOT DETECTED Final   Klebsiella oxytoca NOT DETECTED NOT DETECTED Final   Klebsiella pneumoniae NOT DETECTED NOT DETECTED Final   Proteus species NOT DETECTED NOT DETECTED Final   Serratia marcescens NOT DETECTED NOT DETECTED Final   Haemophilus influenzae NOT DETECTED NOT DETECTED Final   Neisseria meningitidis NOT DETECTED NOT DETECTED Final   Pseudomonas aeruginosa NOT DETECTED NOT DETECTED Final   Candida albicans NOT DETECTED NOT DETECTED Final   Candida glabrata NOT DETECTED NOT DETECTED Final   Candida krusei NOT DETECTED NOT DETECTED Final   Candida parapsilosis NOT DETECTED NOT DETECTED Final   Candida tropicalis NOT DETECTED NOT DETECTED Final  MRSA PCR Screening     Status: None   Collection Time: 02/14/17  1:23 AM  Result Value Ref Range Status   MRSA by PCR NEGATIVE NEGATIVE Final    Comment:        The GeneXpert MRSA Assay (FDA approved for NASAL  specimens only), is one component of a comprehensive MRSA colonization surveillance program. It is not intended to diagnose MRSA infection nor to guide or monitor treatment for MRSA infections.     Coagulation Studies:  Recent Labs  02/13/17 1729 02/15/17 1027  LABPROT 15.7* 15.8*  INR 1.26 1.27    Imaging: Dg Abdomen 1 View  Result Date: 02/13/2017 CLINICAL DATA:  81 y/o  F; orogastric tube placement. EXAM: ABDOMEN - 1 VIEW COMPARISON:  07/08/2016 CT abdomen and pelvis. FINDINGS: Enteric tube tip projects over the gastric body. Aortic calcific atherosclerosis. Moderate levocurvature of the lumbar spine with apex at L3. Surgical clips and sutures  project over the gastroesophageal junction and left upper quadrant. Normal bowel gas pattern. Left kidney lower pole renal calculi as seen on prior CT. IMPRESSION: Enteric tube tip projects over the gastric body. Electronically Signed   By: Kristine Garbe M.D.   On: 02/13/2017 18:12   Ct Head Wo Contrast  Result Date: 02/13/2017 CLINICAL DATA:  81 y/o  F; high fever an active seizure on arrival. EXAM: CT HEAD WITHOUT CONTRAST TECHNIQUE: Contiguous axial images were obtained from the base of the skull through the vertex without intravenous contrast. COMPARISON:  07/08/2016 CT head FINDINGS: Brain: No evidence of acute infarction, hemorrhage, hydrocephalus, extra-axial collection or new mass lesion/mass effect. Stable right parietal region dural dense mass measuring 21 mm in axial plane compatible with meningioma. Stable chronic microvascular ischemic changes and parenchymal volume loss of the brain. Small stable chronic infarction of left lateral cerebellar hemisphere. Vascular: Calcific atherosclerosis of carotid siphons. No hyperdense vessel. Skull: Normal. Negative for fracture or focal lesion. Sinuses/Orbits: No acute finding. Other: None. IMPRESSION: 1. No acute intracranial abnormality identified. 2. Stable right parietal  meningioma. 3. Stable chronic microvascular ischemic changes and parenchymal volume loss of the brain. Electronically Signed   By: Kristine Garbe M.D.   On: 02/13/2017 18:41   Mr Jeri Cos YK Contrast  Addendum Date: 02/14/2017   ADDENDUM REPORT: 2020-09-516 13:27 ADDENDUM: Study discussed by telephone with Dr. Flora Lipps on 02/14/2017 at 1321 hours, and he advised that the patient did present with high fever (102, 103 F) and was started on empiric antiviral herpes treatment at admission. LP for CSF analysis planned for tomorrow. Electronically Signed   By: Genevie Ann M.D.   On: 2020-09-516 13:27   Result Date: 02/14/2017 CLINICAL DATA:  80 year old female became unresponsive after feeling un-well. Seizure, hypoxia. Intubated. EXAM: MRI HEAD WITHOUT AND WITH CONTRAST TECHNIQUE: Multiplanar, multiecho pulse sequences of the brain and surrounding structures were obtained without and with intravenous contrast. CONTRAST:  14 mL MultiHance COMPARISON:  Head CT without contrast 02/13/2017 and earlier. FINDINGS: Brain: Multifocal abnormal cortical diffusion, T2, and FLAIR hyperintensity. The most intense areas of abnormal diffusion are in the right parietal lobe and right occipital pole cortex and subcortical white matter (series 100, images 29 and 34). However, there is mild to moderate cortical signal abnormality tracking along the right insula, the dorsal and medial right thalamus (series 100, image 28), the right hippocampus and mesial temporal lobe, and also the right insula. See series 101 images 27 and 28. The occipital foci demonstrate confluent T2 and FLAIR hyperintensity. There is more subtle T2 and FLAIR hyperintensity in the other areas. No associated hemorrhage or mass effect. No associated enhancement in these areas. No definite contralateral left temporal lobe or insula involvement. However, there are small intense foci of diffusion abnormality also in both superior cerebellar hemispheres (series  100, images 15-18). No brainstem involvement. Following contrast the only abnormal area of enhancement is a round 2.3 cm meningioma along the right posterior frontal convexity (series 14, image 11). No dural thickening. Patchy and confluent scattered bilateral cerebral white matter T2 and FLAIR hyperintensity is nonspecific but appears chronic on diffusion. Similar patchy T2 hyperintensity in the left thalamus which is facilitated on diffusion. No midline shift, ventriculomegaly, extra-axial fluid collection or acute intracranial hemorrhage. Cervicomedullary junction and pituitary are within normal limits. Vascular: Major intracranial vascular flow voids are preserved. Skull and upper cervical spine: Negative. Visualized bone marrow signal is within normal limits. Sinuses/Orbits: Normal orbit soft tissues. Paranasal  sinuses are clear. Other: Intubated with fluid layering in the pharynx. Mild mastoid effusions. Visible internal auditory structures appear normal. Oral enteric tube also in place. Scalp soft tissues appear negative. IMPRESSION: 1. Multifocal acute abnormal signal in the brain (see #2 and 3). No associated hemorrhage, enhancement, or mass effect. 2. Abnormal signal in the right mesial temporal lobe, right thalamus, and right cingulate gyrus is nonspecific and might be postictal, but consider Acute Herpes Encephalitis if the patient is febrile and recommend empiric antiviral treatment. 3. Abnormal signal in the right occipital pole, posterior right parietal lobe, and both cerebellar hemispheres most resembles several acute infarcts. 4. A chronic 2.3 cm posterior right frontal convexity meningioma with mild regional mass effect but no associated edema is probably clinically silent. 5. Patchy bilateral cerebral white matter and left thalamic signal abnormality, nonspecific but may be related to chronic small vessel disease. Electronically Signed: By: Genevie Ann M.D. On: 04/26/202018 13:14   Dg Chest Port 1  View  Result Date: 02/13/2017 CLINICAL DATA:  81 y/o F; code sepsis, post intubation, evaluate for pneumonia or pneumothorax. Patient actively season. EXAM: PORTABLE CHEST 1 VIEW COMPARISON:  07/08/2016 chest CT and chest radiograph. FINDINGS: Endotracheal tube 2.8 cm from the carina. Enteric tube extends below the field of view into the abdomen. Surgical clip at the gastroesophageal junction. Stable normal cardiac silhouette. Aortic calcific atherosclerosis. No focal consolidation, effusion, or pneumothorax. Mild stable biapical pleuroparenchymal scarring. IMPRESSION: Endotracheal tube 2.8 cm from carina. Enteric tube tip below field of view in the abdomen. No acute pulmonary process. Electronically Signed   By: Kristine Garbe M.D.   On: 02/13/2017 18:11    Medications:  I have reviewed the patient's current medications. Scheduled: . chlorhexidine gluconate (MEDLINE KIT)  15 mL Mouth Rinse BID  . feeding supplement (PRO-STAT SUGAR FREE 64)  30 mL Per Tube BID  . feeding supplement (VITAL 1.5 CAL)  1,000 mL Per Tube Q24H  . free water  175 mL Per Tube Q6H  . mouth rinse  15 mL Mouth Rinse 10 times per day  . multivitamin  15 mL Per Tube Daily  . pantoprazole (PROTONIX) IV  40 mg Intravenous Q24H    Assessment/Plan: Patient continues to be febrile.  White blood cell count remains elevated.  MRI of the brain shows small acute cerebral infarcts bilaterally and abnormal right mesial temporal lobe signal concerning for herpes encephalitis.  Meningioma noted as well.  EEG shows frequent right frontotemporal sharp transients.  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  Patient on Acyclovir and broad spectrum antibiotics.  LP indicated.  Patient now off [place] for 48 hours.  Risks and benefits discussed with daughter.    Recommendations: 1.  Repeat coags 2.  LP today 3.  Due to frequency of right temporal sharp transients, Keppra increased to '1000mg'$  IV q 12 hours.       LOS: 2 days    Alexis Goodell, MD Neurology [phone] 02/15/2017  12:34 PM

## 2017-02-15 NOTE — Progress Notes (Signed)
PHARMACY - PHYSICIAN COMMUNICATION CRITICAL VALUE ALERT - BLOOD CULTURE IDENTIFICATION (BCID)  Results for orders placed or performed during the hospital encounter of 02/13/17  Blood Culture ID Panel (Reflexed) (Collected: 02/13/2017  5:29 PM)  Result Value Ref Range   Enterococcus species NOT DETECTED NOT DETECTED   Listeria monocytogenes NOT DETECTED NOT DETECTED   Staphylococcus species NOT DETECTED NOT DETECTED   Staphylococcus aureus NOT DETECTED NOT DETECTED   Streptococcus species NOT DETECTED NOT DETECTED   Streptococcus agalactiae NOT DETECTED NOT DETECTED   Streptococcus pneumoniae NOT DETECTED NOT DETECTED   Streptococcus pyogenes NOT DETECTED NOT DETECTED   Acinetobacter baumannii NOT DETECTED NOT DETECTED   Enterobacteriaceae species NOT DETECTED NOT DETECTED   Enterobacter cloacae complex NOT DETECTED NOT DETECTED   Escherichia coli NOT DETECTED NOT DETECTED   Klebsiella oxytoca NOT DETECTED NOT DETECTED   Klebsiella pneumoniae NOT DETECTED NOT DETECTED   Proteus species NOT DETECTED NOT DETECTED   Serratia marcescens NOT DETECTED NOT DETECTED   Haemophilus influenzae NOT DETECTED NOT DETECTED   Neisseria meningitidis NOT DETECTED NOT DETECTED   Pseudomonas aeruginosa NOT DETECTED NOT DETECTED   Candida albicans NOT DETECTED NOT DETECTED   Candida glabrata NOT DETECTED NOT DETECTED   Candida krusei NOT DETECTED NOT DETECTED   Candida parapsilosis NOT DETECTED NOT DETECTED   Candida tropicalis NOT DETECTED NOT DETECTED    Name of physician (or Provider) Contacted: Dr Mortimer Fries and I spoke in the ICU  Changes to prescribed antibiotics required: None at this time, pending [facility] results.  Donna Christen Blakely Gluth 02/15/2017  12:24 PM

## 2017-02-15 NOTE — Progress Notes (Signed)
[facility] [place] NAME: Melissa Cochran    MR#:  [id number]  DATE OF BIRTH:  1936/04/04  SUBJECTIVE:  CHIEF COMPLAINT:   Chief Complaint  Patient presents with  . Seizures    came with AMS, Seizures and fever. Intubated for airway protection.  MRI noted to have likely  Changes of Encephalitis  Seen by neurology also, as was on anticoagulant, advised to give broad spectrum ABx for now without lumber puncture.  REVIEW OF SYSTEMS:  Pt is intubated, can not give ROS.  ROS  DRUG ALLERGIES:  No Known Allergies  VITALS:  Blood pressure 136/76, pulse (!) 109, temperature 98.6 F (37 C), temperature source Oral, resp. rate 19, height 5\' 5"  (1.651 m), weight 71 kg (156 lb 8.4 oz), SpO2 98 %.  PHYSICAL EXAMINATION:  GENERAL:  81 y.o.-year-old patient lying in the bed with no acute distress.  EYES: Pupils equal, round, reactive to light . No scleral icterus.   HEENT: Head atraumatic, normocephalic. Oropharynx and nasopharynx clear.  NECK:  Supple, no jugular venous distention. No thyroid enlargement, no tenderness.  LUNGS: Normal breath sounds bilaterally, no wheezing, rales,rhonchi or crepitation. No use of accessory muscles of respiration. ETT , on vent support. CARDIOVASCULAR: S1, S2 normal. No murmurs, rubs, or gallops.  ABDOMEN: Soft, nontender, nondistended. Bowel sounds present. No organomegaly or mass.  EXTREMITIES: No pedal edema, cyanosis, or clubbing.  NEUROLOGIC: sedated on vent. Off sedation meds since morning, still not waking up. PSYCHIATRIC: The patient is sedated.  SKIN: No obvious rash, lesion, or ulcer.   Physical Exam LABORATORY PANEL:   CBC  Recent Labs Lab 02/14/17 0257  WBC 17.0*  HGB 12.8  HCT 39.3  PLT 307   ------------------------------------------------------------------------------------------------------------------  Chemistries   Recent Labs Lab 02/13/17 1729  02/15/17 0506  NA 141  < >  138  K 4.8  < > 3.4*  CL 108  < > 104  CO2 22  < > 27  GLUCOSE 91  < > 174*  BUN 18  < > 21*  CREATININE 1.03*  < > 0.70  CALCIUM 8.5*  < > 8.7*  MG  --   < > 2.2  AST 72*  --   --   ALT 13*  --   --   ALKPHOS 120  --   --   BILITOT 0.5  --   --   < > = values in this interval not displayed. ------------------------------------------------------------------------------------------------------------------  Cardiac Enzymes  Recent Labs Lab 02/13/17 2135 02/14/17 0257  TROPONINI 11.29* 15.17*   ------------------------------------------------------------------------------------------------------------------  RADIOLOGY:  Dg Abdomen 1 View  Result Date: 02/13/2017 CLINICAL DATA:  81 y/o  F; orogastric tube placement. EXAM: ABDOMEN - 1 VIEW COMPARISON:  07/08/2016 CT abdomen and pelvis. FINDINGS: Enteric tube tip projects over the gastric body. Aortic calcific atherosclerosis. Moderate levocurvature of the lumbar spine with apex at L3. Surgical clips and sutures project over the gastroesophageal junction and left upper quadrant. Normal bowel gas pattern. Left kidney lower pole renal calculi as seen on prior CT. IMPRESSION: Enteric tube tip projects over the gastric body. Electronically Signed   By: Kristine Garbe M.D.   On: 02/13/2017 18:12   Ct Head Wo Contrast  Result Date: 02/13/2017 CLINICAL DATA:  81 y/o  F; high fever an active seizure on arrival. EXAM: CT HEAD WITHOUT CONTRAST TECHNIQUE: Contiguous axial images were obtained from the base of the skull through the vertex without intravenous contrast. COMPARISON:  07/08/2016 CT head FINDINGS: Brain: No evidence of acute infarction, hemorrhage, hydrocephalus, extra-axial collection or new mass lesion/mass effect. Stable right parietal region dural dense mass measuring 21 mm in axial plane compatible with meningioma. Stable chronic microvascular ischemic changes and parenchymal volume loss of the brain. Small stable chronic  infarction of left lateral cerebellar hemisphere. Vascular: Calcific atherosclerosis of carotid siphons. No hyperdense vessel. Skull: Normal. Negative for fracture or focal lesion. Sinuses/Orbits: No acute finding. Other: None. IMPRESSION: 1. No acute intracranial abnormality identified. 2. Stable right parietal meningioma. 3. Stable chronic microvascular ischemic changes and parenchymal volume loss of the brain. Electronically Signed   By: Kristine Garbe M.D.   On: 02/13/2017 18:41   Mr Jeri Cos [place] Contrast  Addendum Date: 02/14/2017   ADDENDUM REPORT: September 17, 202018 13:27 ADDENDUM: Study discussed by telephone with Dr. Flora Lipps on 02/14/2017 at 1321 hours, and he advised that the patient did present with high fever (102, 103 F) and was started on empiric antiviral herpes treatment at admission. LP for CSF analysis planned for tomorrow. Electronically Signed   By: Genevie Ann M.D.   On: September 17, 202018 13:27   Result Date: 02/14/2017 CLINICAL DATA:  81 year old female became unresponsive after feeling un-well. Seizure, hypoxia. Intubated. EXAM: MRI HEAD WITHOUT AND WITH CONTRAST TECHNIQUE: Multiplanar, multiecho pulse sequences of the brain and surrounding structures were obtained without and with intravenous contrast. CONTRAST:  14 mL MultiHance COMPARISON:  Head CT without contrast 02/13/2017 and earlier. FINDINGS: Brain: Multifocal abnormal cortical diffusion, T2, and FLAIR hyperintensity. The most intense areas of abnormal diffusion are in the right parietal lobe and right occipital pole cortex and subcortical white matter (series 100, images 29 and 34). However, there is mild to moderate cortical signal abnormality tracking along the right insula, the dorsal and medial right thalamus (series 100, image 28), the right hippocampus and mesial temporal lobe, and also the right insula. See series 101 images 27 and 28. The occipital foci demonstrate confluent T2 and FLAIR hyperintensity. There is more subtle T2  and FLAIR hyperintensity in the other areas. No associated hemorrhage or mass effect. No associated enhancement in these areas. No definite contralateral left temporal lobe or insula involvement. However, there are small intense foci of diffusion abnormality also in both superior cerebellar hemispheres (series 100, images 15-18). No brainstem involvement. Following contrast the only abnormal area of enhancement is a round 2.3 cm meningioma along the right posterior frontal convexity (series 14, image 11). No dural thickening. Patchy and confluent scattered bilateral cerebral white matter T2 and FLAIR hyperintensity is nonspecific but appears chronic on diffusion. Similar patchy T2 hyperintensity in the left thalamus which is facilitated on diffusion. No midline shift, ventriculomegaly, extra-axial fluid collection or acute intracranial hemorrhage. Cervicomedullary junction and pituitary are within normal limits. Vascular: Major intracranial vascular flow voids are preserved. Skull and upper cervical spine: Negative. Visualized bone marrow signal is within normal limits. Sinuses/Orbits: Normal orbit soft tissues. Paranasal sinuses are clear. Other: Intubated with fluid layering in the pharynx. Mild mastoid effusions. Visible internal auditory structures appear normal. Oral enteric tube also in place. Scalp soft tissues appear negative. IMPRESSION: 1. Multifocal acute abnormal signal in the brain (see #2 and 3). No associated hemorrhage, enhancement, or mass effect. 2. Abnormal signal in the right mesial temporal lobe, right thalamus, and right cingulate gyrus is nonspecific and might be postictal, but consider Acute Herpes Encephalitis if the patient is febrile and recommend empiric antiviral treatment. 3. Abnormal signal in the right occipital pole, posterior  right parietal lobe, and both cerebellar hemispheres most resembles several acute infarcts. 4. A chronic 2.3 cm posterior right frontal convexity meningioma  with mild regional mass effect but no associated edema is probably clinically silent. 5. Patchy bilateral cerebral white matter and left thalamic signal abnormality, nonspecific but may be related to chronic small vessel disease. Electronically Signed: By: Genevie Ann M.D. On: 01-Sep-202018 13:14   Dg Chest Port 1 View  Result Date: 02/13/2017 CLINICAL DATA:  81 y/o F; code sepsis, post intubation, evaluate for pneumonia or pneumothorax. Patient actively season. EXAM: PORTABLE CHEST 1 VIEW COMPARISON:  07/08/2016 chest CT and chest radiograph. FINDINGS: Endotracheal tube 2.8 cm from the carina. Enteric tube extends below the field of view into the abdomen. Surgical clip at the gastroesophageal junction. Stable normal cardiac silhouette. Aortic calcific atherosclerosis. No focal consolidation, effusion, or pneumothorax. Mild stable biapical pleuroparenchymal scarring. IMPRESSION: Endotracheal tube 2.8 cm from carina. Enteric tube tip below field of view in the abdomen. No acute pulmonary process. Electronically Signed   By: Kristine Garbe M.D.   On: 02/13/2017 18:11    ASSESSMENT AND PLAN:   Active Problems:   Convulsive status epilepticus (HCC)  * Acute encephalopathy- Altered mental status   Likely meningitis Vs encephalitis   On broad spectrum Abx.    No LP now as was on Anticoagulant.  * Status epilepticus  continue sedation medication IV, Keppra IV and neurology consult.  * Acute respiratory failure with hypoxia Continue ventilation and intensivist consult.  * Sepsis due to UTI and meningitis  sepsis protocol, continue antibiotics, follow-up CBC and cultures.  * Elevated troponin. Possible due to demanding ischemia secondary to seizure, respiratory failure and sepsis.     cardiology consult.    Echo.  * Accelerated hypertension. IV hydralazine when necessary. * COPD. Stable. * Chronic diastolic CHF. Stable.   All the records are reviewed and case discussed with Care  Management/Social Workerr. Management plans discussed with the patient, family and they are in agreement.  CODE STATUS: Full.  TOTAL TIME TAKING CARE OF THIS PATIENT: 35 minutes.     POSSIBLE D/C IN 2-3 DAYS, DEPENDING ON CLINICAL CONDITION.   Vaughan Basta M.D on 02/15/2017   Between 7am to 6pm - Pager - [phone]  After 6pm go to [email or web address] - password [email or web address] [facility]  Office  [phone]  CC: Primary care physician; Teodoro Spray, MD  Note: This dictation was prepared with Dragon dictation along with smaller phrase technology. Any transcriptional errors that result from this process are unintentional.

## 2017-02-15 NOTE — Consult Note (Signed)
[facility]     Reason for Consult:Meningitis  Referring Physician:  Corbin Ade Date of Admission:  02/13/2017   Active Problems:   Convulsive status epilepticus [facility])   HPI: Melissa Cochran is a 81 y.o. female admitted with AMS and seizures. She was intubated in ED for persistent seizures and has been in ICU.  On admit temp 102.6, wbc 17 K.  UA TNTC wbc.  MRI abnormal and concern for HSV. Started on acyclovir.  BCx + GPC but biofire neg, UCX E coli.  She remains intubated and unresponsive. LP done today.  Past Medical History:  Diagnosis Date  . Adnexal mass   . Anxiety   . Arthritis   . Bladder tumor   . Bleeding ulcer   . CHF (congestive heart failure) ([place])   . CHF (congestive heart failure) ([place])   . COPD (chronic obstructive pulmonary disease) ([place])   . Coronary artery disease   . GERD (gastroesophageal reflux disease)   . Heart block    left  . History of bleeding ulcers   . Hyperlipemia   . Hypertension   . Pneumonia   . Shortness of breath dyspnea    Past Surgical History:  Procedure Laterality Date  . ABDOMINAL HYSTERECTOMY    . CARDIAC CATHETERIZATION Left 10/21/2015   Procedure: Left Heart Cath and Coronary Angiography;  Surgeon: Isaias Cowman, MD;  Location: [place] [facility];  Service: Cardiovascular;  Laterality: Left;  . CARDIAC CATHETERIZATION N/A 10/21/2015   Procedure: Coronary Stent Intervention;  Surgeon: Isaias Cowman, MD;  Location: [place] [facility];  Service: Cardiovascular;  Laterality: N/A;  . CARDIAC SURGERY     cardiac cath  . CYSTOSCOPY W/ RETROGRADES Bilateral 01/20/2016   Procedure: CYSTOSCOPY WITH RETROGRADE PYELOGRAM;  Surgeon: Hollice Espy, MD;  Location: [facility];  Service: Urology;  Laterality: Bilateral;  . CYSTOSCOPY WITH STENT PLACEMENT Right 01/20/2016   Procedure: CYSTOSCOPY WITH STENT PLACEMENT;  Surgeon: Hollice Espy, MD;  Location: [facility] OR[facility];  Service: Urology;  Laterality: Right;  .  HIP SURGERY     left  . STOMACH SURGERY    . TRANSURETHRAL RESECTION OF BLADDER TUMOR N/A 01/20/2016   Procedure: TRANSURETHRAL RESECTION OF BLADDER TUMOR (TURBT);  Surgeon: Hollice Espy, MD;  Location: [facility];  Service: Urology;  Laterality: N/A;   Social History  Substance Use Topics  . Smoking status: Former Smoker    Packs/day: 1.00    Years: 40.00    Quit date: 06/13/1998  . Smokeless tobacco: Never Used  . Alcohol use No   Family History  Problem Relation Age of Onset  . Stomach cancer Mother   . CVA Father   . Bladder Cancer Neg Hx   . Kidney cancer Neg Hx     Allergies: No Known Allergies  Current antibiotics: Antibiotics Given (last 72 hours)    Date/Time Action Medication Dose Rate   02/13/17 1804 New Bag/Given   piperacillin-tazobactam (ZOSYN) IVPB 3.375 g 3.375 g 100 mL/hr   02/13/17 1859 New Bag/Given   vancomycin (VANCOCIN) IVPB 1000 mg/200 mL premix 1,000 mg 200 mL/hr   02/13/17 2125 New Bag/Given   cefTRIAXone (ROCEPHIN) 1 g in dextrose 5 % 50 mL IVPB 1 g 120 mL/hr   02/14/17 1113 New Bag/Given   vancomycin (VANCOCIN) IVPB 1000 mg/200 mL premix 1,000 mg 200 mL/hr   02/14/17 1305 New Bag/Given   ampicillin (OMNIPEN) 2 g in sodium chloride 0.9 % 50 mL IVPB 2 g 150 mL/hr  02/14/17 1329 New Bag/Given   cefTRIAXone (ROCEPHIN) 1 g in dextrose 5 % 50 mL IVPB 1 g 100 mL/hr   02/14/17 1353 New Bag/Given   acyclovir (ZOVIRAX) 650 mg in dextrose 5 % 100 mL IVPB 650 mg 113 mL/hr   02/14/17 1547 New Bag/Given   ampicillin (OMNIPEN) 2 g in sodium chloride 0.9 % 50 mL IVPB 2 g 150 mL/hr   02/14/17 1613 New Bag/Given   vancomycin (VANCOCIN) IVPB 1000 mg/200 mL premix 1,000 mg 200 mL/hr   02/14/17 2029 New Bag/Given   acyclovir (ZOVIRAX) 650 mg in dextrose 5 % 100 mL IVPB 650 mg 113 mL/hr   02/14/17 2030 New Bag/Given   ampicillin (OMNIPEN) 2 g in sodium chloride 0.9 % 50 mL IVPB 2 g 150 mL/hr   02/14/17 2203 New Bag/Given   cefTRIAXone (ROCEPHIN) 2 g in dextrose 5  % 50 mL IVPB 2 g 100 mL/hr   02/15/17 0013 New Bag/Given   ampicillin (OMNIPEN) 2 g in sodium chloride 0.9 % 50 mL IVPB 2 g 150 mL/hr   02/15/17 0306 New Bag/Given   acyclovir (ZOVIRAX) 650 mg in dextrose 5 % 100 mL IVPB 650 mg 113 mL/hr   02/15/17 0451 New Bag/Given   ampicillin (OMNIPEN) 2 g in sodium chloride 0.9 % 50 mL IVPB 2 g 150 mL/hr   02/15/17 0744 New Bag/Given   ampicillin (OMNIPEN) 2 g in sodium chloride 0.9 % 50 mL IVPB 2 g 150 mL/hr   02/15/17 1009 New Bag/Given   cefTRIAXone (ROCEPHIN) 2 g in dextrose 5 % 50 mL IVPB 2 g 100 mL/hr   02/15/17 1009 New Bag/Given   vancomycin (VANCOCIN) IVPB 1000 mg/200 mL premix 1,000 mg 200 mL/hr   02/15/17 1100 New Bag/Given   acyclovir (ZOVIRAX) 650 mg in dextrose 5 % 100 mL IVPB 650 mg 113 mL/hr   02/15/17 1226 New Bag/Given   ampicillin (OMNIPEN) 2 g in sodium chloride 0.9 % 50 mL IVPB 2 g 150 mL/hr   02/15/17 1624 New Bag/Given   ampicillin (OMNIPEN) 2 g in sodium chloride 0.9 % 50 mL IVPB 2 g 150 mL/hr      MEDICATIONS: . chlorhexidine gluconate (MEDLINE KIT)  15 mL Mouth Rinse BID  . feeding supplement (PRO-STAT SUGAR FREE 64)  30 mL Per Tube BID  . feeding supplement (VITAL 1.5 CAL)  1,000 mL Per Tube Q24H  . free water  175 mL Per Tube Q6H  . mouth rinse  15 mL Mouth Rinse 10 times per day  . multivitamin  15 mL Per Tube Daily  . pantoprazole (PROTONIX) IV  40 mg Intravenous Q24H    Review of Systems - unable to obtain OBJECTIVE: Temp:  [98.6 F (37 C)-101.6 F (38.7 C)] 99.8 F (37.7 C) (10/09 1600) Pulse Rate:  [99-128] 101 (10/09 1800) Resp:  [16-28] 21 (10/09 1800) BP: (121-181)/(57-98) 130/66 (10/09 1700) SpO2:  [98 %-100 %] 99 % (10/09 1800) FiO2 (%):  [28 %] 28 % (10/09 1420) Weight:  [71 kg (156 lb 8.4 oz)] 71 kg (156 lb 8.4 oz) (10/09 0500) Physical Exam  Constitutional:  Intubated, unresponsive  HENT: , anicteric Mouth/Throat: ETT in place  Cardiovascular: Normal rate, regular rhythm and normal heart  sounds. -Pulmonary/Chest: Effort normal and breath sounds normal. No respiratory distress.  has no wheezes.  Neck = supple, no nuchal rigidity Abdominal: Soft. Bowel sounds are normal.  exhibits no distension. There is no tenderness.  Lymphadenopathy: no cervical adenopathy. No axillary adenopathy Neurological:  unresponsive Skin: Skin is warm and dry. No rash noted. No erythema.  Psychiatric unresponsive   LABS: Results for orders placed or performed during the hospital encounter of 02/13/17 (from the past 48 hour(s))  Glucose, capillary     Status: None   Collection Time: 02/13/17  8:51 PM  Result Value Ref Range   Glucose-Capillary 89 65 - 99 mg/dL  Lactic acid, plasma     Status: Abnormal   Collection Time: 02/13/17  9:35 PM  Result Value Ref Range   Lactic Acid, Venous 3.6 (HH) 0.5 - 1.9 mmol/L    Comment: CRITICAL RESULT CALLED TO, READ BACK BY AND VERIFIED WITH SHAUNA Trappe AT 2218 02/13/17.PMH  Troponin I     Status: Abnormal   Collection Time: 02/13/17  9:35 PM  Result Value Ref Range   Troponin I 11.29 (HH) <0.03 ng/mL    Comment: CRITICAL RESULT CALLED TO, READ BACK BY AND VERIFIED WITH SHAUNA HEDRICK AT 2210 02/13/17.PMH  Hemoglobin A1c     Status: None   Collection Time: 02/13/17  9:35 PM  Result Value Ref Range   Hgb A1c MFr Bld 5.1 4.8 - 5.6 %    Comment: (NOTE) Pre diabetes:          5.7%-6.4% Diabetes:              >6.4% Glycemic control for   <7.0% adults with diabetes    Mean Plasma Glucose 99.67 mg/dL    Comment: Performed at [facility] [address] [place], [place] [address]  Procalcitonin     Status: None   Collection Time: 02/13/17  9:35 PM  Result Value Ref Range   Procalcitonin 1.97 ng/mL    Comment:        Interpretation: PCT > 0.5 ng/mL and <= 2 ng/mL: Systemic infection (sepsis) is possible, but other conditions are known to elevate PCT as well. (NOTE)         ICU PCT Algorithm               Non ICU PCT Algorithm     ----------------------------     ------------------------------         PCT < 0.25 ng/mL                 PCT < 0.1 ng/mL     Stopping of antibiotics            Stopping of antibiotics       strongly encouraged.               strongly encouraged.    ----------------------------     ------------------------------       PCT level decrease by               PCT < 0.25 ng/mL       >= 80% from peak PCT       OR PCT 0.25 - 0.5 ng/mL          Stopping of antibiotics                                             encouraged.     Stopping of antibiotics           encouraged.    ----------------------------     ------------------------------       PCT level decrease by  PCT >= 0.25 ng/mL       < 80% from peak PCT        AND PCT >= 0.5 ng/mL             Continuing antibiotics                                              encouraged.       Continuing antibiotics            encouraged.    ----------------------------     ------------------------------     PCT level increase compared          PCT > 0.5 ng/mL         with peak PCT AND          PCT >= 0.5 ng/mL             Escalation of antibiotics                                          strongly encouraged.      Escalation of antibiotics        strongly encouraged.   MRSA PCR Screening     Status: None   Collection Time: 02/14/17  1:23 AM  Result Value Ref Range   MRSA by PCR NEGATIVE NEGATIVE    Comment:        The GeneXpert MRSA Assay (FDA approved for NASAL specimens only), is one component of a comprehensive MRSA colonization surveillance program. It is not intended to diagnose MRSA infection nor to guide or monitor treatment for MRSA infections.   Troponin I     Status: Abnormal   Collection Time: 02/14/17  2:57 AM  Result Value Ref Range   Troponin I 15.17 (HH) <0.03 ng/mL    Comment: CRITICAL VALUE NOTED. VALUE IS CONSISTENT WITH PREVIOUSLY REPORTED/CALLED VALUE.PMH  CBC     Status: Abnormal   Collection Time: 02/14/17   2:57 AM  Result Value Ref Range   WBC 17.0 (H) 3.6 - 11.0 K/uL   RBC 4.40 3.80 - 5.20 MIL/uL   Hemoglobin 12.8 12.0 - 16.0 g/dL   HCT 39.3 35.0 - 47.0 %   MCV 89.3 80.0 - 100.0 fL   MCH 29.0 26.0 - 34.0 pg   MCHC 32.5 32.0 - 36.0 g/dL   RDW 15.4 (H) 11.5 - 14.5 %   Platelets 307 150 - 440 K/uL  Basic metabolic panel     Status: Abnormal   Collection Time: 02/14/17  2:57 AM  Result Value Ref Range   Sodium 142 135 - 145 mmol/L   Potassium 3.6 3.5 - 5.1 mmol/L   Chloride 106 101 - 111 mmol/L   CO2 25 22 - 32 mmol/L   Glucose, Bld 126 (H) 65 - 99 mg/dL   BUN 14 6 - 20 mg/dL   Creatinine, Ser 0.89 0.44 - 1.00 mg/dL   Calcium 8.5 (L) 8.9 - 10.3 mg/dL   GFR calc non Af Amer >60 >60 mL/min   GFR calc Af Amer >60 >60 mL/min    Comment: (NOTE) The eGFR has been calculated using the CKD EPI equation. This calculation has not been validated in all clinical situations. eGFR's persistently <60 mL/min signify possible Chronic Kidney  Disease.    Anion gap 11 5 - 15  Glucose, capillary     Status: Abnormal   Collection Time: 02/14/17  1:16 PM  Result Value Ref Range   Glucose-Capillary 103 (H) 65 - 99 mg/dL  Magnesium     Status: None   Collection Time: 02/14/17  1:30 PM  Result Value Ref Range   Magnesium 1.9 1.7 - 2.4 mg/dL  Phosphorus     Status: None   Collection Time: 02/14/17  1:30 PM  Result Value Ref Range   Phosphorus 3.5 2.5 - 4.6 mg/dL  Glucose, capillary     Status: Abnormal   Collection Time: 02/14/17  4:16 PM  Result Value Ref Range   Glucose-Capillary 130 (H) 65 - 99 mg/dL  Glucose, capillary     Status: Abnormal   Collection Time: 02/14/17  7:51 PM  Result Value Ref Range   Glucose-Capillary 174 (H) 65 - 99 mg/dL  Glucose, capillary     Status: Abnormal   Collection Time: 02/14/17 11:32 PM  Result Value Ref Range   Glucose-Capillary 161 (H) 65 - 99 mg/dL  Glucose, capillary     Status: Abnormal   Collection Time: 02/15/17  4:10 AM  Result Value Ref Range    Glucose-Capillary 170 (H) 65 - 99 mg/dL  Magnesium     Status: None   Collection Time: 02/15/17  5:06 AM  Result Value Ref Range   Magnesium 2.2 1.7 - 2.4 mg/dL  Phosphorus     Status: Abnormal   Collection Time: 02/15/17  5:06 AM  Result Value Ref Range   Phosphorus 1.8 (L) 2.5 - 4.6 mg/dL  Basic metabolic panel     Status: Abnormal   Collection Time: 02/15/17  5:06 AM  Result Value Ref Range   Sodium 138 135 - 145 mmol/L   Potassium 3.4 (L) 3.5 - 5.1 mmol/L   Chloride 104 101 - 111 mmol/L   CO2 27 22 - 32 mmol/L   Glucose, Bld 174 (H) 65 - 99 mg/dL   BUN 21 (H) 6 - 20 mg/dL   Creatinine, Ser 0.70 0.44 - 1.00 mg/dL   Calcium 8.7 (L) 8.9 - 10.3 mg/dL   GFR calc non Af Amer >60 >60 mL/min   GFR calc Af Amer >60 >60 mL/min    Comment: (NOTE) The eGFR has been calculated using the CKD EPI equation. This calculation has not been validated in all clinical situations. eGFR's persistently <60 mL/min signify possible Chronic Kidney Disease.    Anion gap 7 5 - 15  Glucose, capillary     Status: Abnormal   Collection Time: 02/15/17  7:29 AM  Result Value Ref Range   Glucose-Capillary 148 (H) 65 - 99 mg/dL  Protime-INR     Status: Abnormal   Collection Time: 02/15/17 10:27 AM  Result Value Ref Range   Prothrombin Time 15.8 (H) 11.4 - 15.2 seconds   INR 1.27   APTT     Status: None   Collection Time: 02/15/17 10:27 AM  Result Value Ref Range   aPTT 30 24 - 36 seconds  Glucose, capillary     Status: Abnormal   Collection Time: 02/15/17 12:45 PM  Result Value Ref Range   Glucose-Capillary 136 (H) 65 - 99 mg/dL  Protein and glucose, CSF     Status: Abnormal   Collection Time: 02/15/17 12:49 PM  Result Value Ref Range   Glucose, CSF 85 (H) 40 - 70 mg/dL   Total  Protein, CSF  30 15 - 45 mg/dL  CSF cell count with differential collection tube #: 1     Status: Abnormal   Collection Time: 02/15/17 12:49 PM  Result Value Ref Range   Tube # 1    Color, CSF RED (A) COLORLESS    Appearance, CSF HAZY (A) CLEAR   Supernatant RED    RBC Count, CSF 5,894 (H) 0 - 3 /cu mm   WBC, CSF 165 (HH) 0 - 5 /cu mm    Comment: CRITICAL RESULT CALLED TO, READ BACK BY AND VERIFIED WITH: JEAN LIVINGSTON AT 1439 ON 02/15/2017 JJB    Segmented Neutrophils-CSF 99 %   Lymphs, CSF 0 %   Monocyte-Macrophage-Spinal Fluid 1 %   Eosinophils, CSF 0 %   Other Cells, CSF 0     Comment: PENDING PATHOLOGIST REVIEW  CSF culture     Status: None (Preliminary result)   Collection Time: 02/15/17 12:49 PM  Result Value Ref Range   Specimen Description CSF    Special Requests Normal    Gram Stain       MODERATE WBC SEEN NO ORGANISMS SEEN RARE RBC SEEN    Culture PENDING    Report Status PENDING   Pathologist smear review     Status: None   Collection Time: 02/15/17 12:49 PM  Result Value Ref Range   Path Review      CSF fluid review shows blood and predominantly neutrophils.  Dr. Luana Shu.    Glucose, capillary     Status: Abnormal   Collection Time: 02/15/17  4:24 PM  Result Value Ref Range   Glucose-Capillary 128 (H) 65 - 99 mg/dL  Magnesium     Status: None   Collection Time: 02/15/17  5:35 PM  Result Value Ref Range   Magnesium 2.2 1.7 - 2.4 mg/dL  Phosphorus     Status: Abnormal   Collection Time: 02/15/17  5:35 PM  Result Value Ref Range   Phosphorus 2.1 (L) 2.5 - 4.6 mg/dL   No components found for: ESR, C REACTIVE PROTEIN MICRO: Recent Results (from the past 720 hour(s))  Blood Culture (routine x 2)     Status: None (Preliminary result)   Collection Time: 02/13/17  5:29 PM  Result Value Ref Range Status   Specimen Description BLOOD LEFT HAND  Final   Special Requests   Final    BOTTLES DRAWN AEROBIC AND ANAEROBIC Blood Culture adequate volume   Culture NO GROWTH 2 DAYS  Final   Report Status PENDING  Incomplete  Blood Culture (routine x 2)     Status: None (Preliminary result)   Collection Time: 02/13/17  5:29 PM  Result Value Ref Range Status   Specimen Description  BLOOD RIGHT HAND  Final   Special Requests   Final    BOTTLES DRAWN AEROBIC AND ANAEROBIC Blood Culture adequate volume   Culture  Setup Time   Final    Organism ID to follow Perrin CRITICAL RESULT CALLED TO, READ BACK BY AND VERIFIED WITH: GARRETT COFFEE AT 1203 02/15/17 SDR    Culture GRAM POSITIVE COCCI  Final   Report Status PENDING  Incomplete  Urine culture     Status: Abnormal (Preliminary result)   Collection Time: 02/13/17  5:29 PM  Result Value Ref Range Status   Specimen Description URINE, RANDOM  Final   Special Requests NONE  Final   Culture >=100,000 COLONIES/mL ESCHERICHIA COLI (A)  Final   Report Status PENDING  Incomplete  Blood Culture ID Panel (Reflexed)     Status: None   Collection Time: 02/13/17  5:29 PM  Result Value Ref Range Status   Enterococcus species NOT DETECTED NOT DETECTED Final   Listeria monocytogenes NOT DETECTED NOT DETECTED Final   Staphylococcus species NOT DETECTED NOT DETECTED Final   Staphylococcus aureus NOT DETECTED NOT DETECTED Final   Streptococcus species NOT DETECTED NOT DETECTED Final   Streptococcus agalactiae NOT DETECTED NOT DETECTED Final   Streptococcus pneumoniae NOT DETECTED NOT DETECTED Final   Streptococcus pyogenes NOT DETECTED NOT DETECTED Final   Acinetobacter baumannii NOT DETECTED NOT DETECTED Final   Enterobacteriaceae species NOT DETECTED NOT DETECTED Final   Enterobacter cloacae complex NOT DETECTED NOT DETECTED Final   Escherichia coli NOT DETECTED NOT DETECTED Final   Klebsiella oxytoca NOT DETECTED NOT DETECTED Final   Klebsiella pneumoniae NOT DETECTED NOT DETECTED Final   Proteus species NOT DETECTED NOT DETECTED Final   Serratia marcescens NOT DETECTED NOT DETECTED Final   Haemophilus influenzae NOT DETECTED NOT DETECTED Final   Neisseria meningitidis NOT DETECTED NOT DETECTED Final   Pseudomonas aeruginosa NOT DETECTED NOT DETECTED Final   Candida albicans NOT DETECTED NOT  DETECTED Final   Candida glabrata NOT DETECTED NOT DETECTED Final   Candida krusei NOT DETECTED NOT DETECTED Final   Candida parapsilosis NOT DETECTED NOT DETECTED Final   Candida tropicalis NOT DETECTED NOT DETECTED Final  MRSA PCR Screening     Status: None   Collection Time: 02/14/17  1:23 AM  Result Value Ref Range Status   MRSA by PCR NEGATIVE NEGATIVE Final    Comment:        The GeneXpert MRSA Assay (FDA approved for NASAL specimens only), is one component of a comprehensive MRSA colonization surveillance program. It is not intended to diagnose MRSA infection nor to guide or monitor treatment for MRSA infections.   CSF culture     Status: None (Preliminary result)   Collection Time: 02/15/17 12:49 PM  Result Value Ref Range Status   Specimen Description CSF  Final   Special Requests Normal  Final   Gram Stain   Final     MODERATE WBC SEEN NO ORGANISMS SEEN RARE RBC SEEN    Culture PENDING  Incomplete   Report Status PENDING  Incomplete    IMAGING: Dg Abdomen 1 View  Result Date: 02/13/2017 CLINICAL DATA:  81 y/o  F; orogastric tube placement. EXAM: ABDOMEN - 1 VIEW COMPARISON:  07/08/2016 CT abdomen and pelvis. FINDINGS: Enteric tube tip projects over the gastric body. Aortic calcific atherosclerosis. Moderate levocurvature of the lumbar spine with apex at L3. Surgical clips and sutures project over the gastroesophageal junction and left upper quadrant. Normal bowel gas pattern. Left kidney lower pole renal calculi as seen on prior CT. IMPRESSION: Enteric tube tip projects over the gastric body. Electronically Signed   By: Kristine Garbe M.D.   On: 02/13/2017 18:12   Ct Head Wo Contrast  Result Date: 02/13/2017 CLINICAL DATA:  81 y/o  F; high fever an active seizure on arrival. EXAM: CT HEAD WITHOUT CONTRAST TECHNIQUE: Contiguous axial images were obtained from the base of the skull through the vertex without intravenous contrast. COMPARISON:  07/08/2016 CT  head FINDINGS: Brain: No evidence of acute infarction, hemorrhage, hydrocephalus, extra-axial collection or new mass lesion/mass effect. Stable right parietal region dural dense mass measuring 21 mm in axial plane compatible with meningioma. Stable chronic microvascular ischemic changes and parenchymal volume loss of the brain.  Small stable chronic infarction of left lateral cerebellar hemisphere. Vascular: Calcific atherosclerosis of carotid siphons. No hyperdense vessel. Skull: Normal. Negative for fracture or focal lesion. Sinuses/Orbits: No acute finding. Other: None. IMPRESSION: 1. No acute intracranial abnormality identified. 2. Stable right parietal meningioma. 3. Stable chronic microvascular ischemic changes and parenchymal volume loss of the brain. Electronically Signed   By: Kristine Garbe M.D.   On: 02/13/2017 18:41   Ct Lumbar Spine Wo Contrast  Result Date: 02/15/2017 CLINICAL DATA:  Meningitis due to other and unspecified causes. Rule out hemorrhage after lumbar puncture today. EXAM: CT LUMBAR SPINE WITHOUT CONTRAST TECHNIQUE: Multidetector CT imaging of the lumbar spine was performed without intravenous contrast administration. Multiplanar CT image reconstructions were also generated. COMPARISON:  Abdominal CT 07/08/2016 FINDINGS: Segmentation: 5 lumbar type vertebrae. Alignment: Moderate levoscoliosis.  Leftward translation at L3-4. Vertebrae: Negative for acute fracture, discitis, or aggressive bone lesion. Advanced endplate irregularity at L3-4 and L4-5 which is chronic and considered degenerative. Canal: Left paracentral dorsal epidural gas at the level of L2 correlating with recent LP history. No evidence of intraspinal hemorrhage, either within the thecal sac or epidural space. Paraspinal and other soft tissues: No evidence of hematoma in the posterior soft tissues. Atherosclerosis, retroperitoneal edema, and left nephrolithiasis. Postoperative changes at the proximal stomach. Disc  levels: T12- L1: Mild facet spurring.  No evidence of impingement L1-L2: Mild facet spurring.  No evidence of impingement L2-L3: Mild facet spurring. Disc narrowing asymmetric to the right with bulging. No evidence of impingement L3-L4: Advanced degenerative disc narrowing asymmetric towards the right. Asymmetric right facet spurring. Moderate right foraminal stenosis. Mild bilateral subarticular recess narrowing. L4-L5: Advanced asymmetric rightward degenerative disc narrowing. Asymmetric left degenerative facet hypertrophy. Ligamentum flavum thickening. There is moderate to advanced spinal stenosis. Left foraminal L4 impingement due to disc height loss and bulging. L5-S1:Mild disc narrowing and bulging.  No evidence of impingement. IMPRESSION: 1. No acute or unexpected finding. No evidence of postprocedural hemorrhage. 2. Scoliosis and advanced L3-4 and L4-5 disc degeneration with stenoses described above. Electronically Signed   By: Monte Fantasia M.D.   On: 02/15/2017 16:15   Mr Jeri Cos QP Contrast  Addendum Date: 02/14/2017   ADDENDUM REPORT: 25-Feb-202018 13:27 ADDENDUM: Study discussed by telephone with Dr. Flora Lipps on 02/14/2017 at 1321 hours, and he advised that the patient did present with high fever (102, 103 F) and was started on empiric antiviral herpes treatment at admission. LP for CSF analysis planned for tomorrow. Electronically Signed   By: Genevie Ann M.D.   On: 25-Feb-202018 13:27   Result Date: 02/14/2017 CLINICAL DATA:  81 year old female became unresponsive after feeling un-well. Seizure, hypoxia. Intubated. EXAM: MRI HEAD WITHOUT AND WITH CONTRAST TECHNIQUE: Multiplanar, multiecho pulse sequences of the brain and surrounding structures were obtained without and with intravenous contrast. CONTRAST:  14 mL MultiHance COMPARISON:  Head CT without contrast 02/13/2017 and earlier. FINDINGS: Brain: Multifocal abnormal cortical diffusion, T2, and FLAIR hyperintensity. The most intense areas of  abnormal diffusion are in the right parietal lobe and right occipital pole cortex and subcortical white matter (series 100, images 29 and 34). However, there is mild to moderate cortical signal abnormality tracking along the right insula, the dorsal and medial right thalamus (series 100, image 28), the right hippocampus and mesial temporal lobe, and also the right insula. See series 101 images 27 and 28. The occipital foci demonstrate confluent T2 and FLAIR hyperintensity. There is more subtle T2 and FLAIR hyperintensity in the other  areas. No associated hemorrhage or mass effect. No associated enhancement in these areas. No definite contralateral left temporal lobe or insula involvement. However, there are small intense foci of diffusion abnormality also in both superior cerebellar hemispheres (series 100, images 15-18). No brainstem involvement. Following contrast the only abnormal area of enhancement is a round 2.3 cm meningioma along the right posterior frontal convexity (series 14, image 11). No dural thickening. Patchy and confluent scattered bilateral cerebral white matter T2 and FLAIR hyperintensity is nonspecific but appears chronic on diffusion. Similar patchy T2 hyperintensity in the left thalamus which is facilitated on diffusion. No midline shift, ventriculomegaly, extra-axial fluid collection or acute intracranial hemorrhage. Cervicomedullary junction and pituitary are within normal limits. Vascular: Major intracranial vascular flow voids are preserved. Skull and upper cervical spine: Negative. Visualized bone marrow signal is within normal limits. Sinuses/Orbits: Normal orbit soft tissues. Paranasal sinuses are clear. Other: Intubated with fluid layering in the pharynx. Mild mastoid effusions. Visible internal auditory structures appear normal. Oral enteric tube also in place. Scalp soft tissues appear negative. IMPRESSION: 1. Multifocal acute abnormal signal in the brain (see #2 and 3). No associated  hemorrhage, enhancement, or mass effect. 2. Abnormal signal in the right mesial temporal lobe, right thalamus, and right cingulate gyrus is nonspecific and might be postictal, but consider Acute Herpes Encephalitis if the patient is febrile and recommend empiric antiviral treatment. 3. Abnormal signal in the right occipital pole, posterior right parietal lobe, and both cerebellar hemispheres most resembles several acute infarcts. 4. A chronic 2.3 cm posterior right frontal convexity meningioma with mild regional mass effect but no associated edema is probably clinically silent. 5. Patchy bilateral cerebral white matter and left thalamic signal abnormality, nonspecific but may be related to chronic small vessel disease. Electronically Signed: By: Genevie Ann M.D. On: 07/23/202018 13:14   Dg Chest Port 1 View  Result Date: 02/13/2017 CLINICAL DATA:  81 y/o F; code sepsis, post intubation, evaluate for pneumonia or pneumothorax. Patient actively season. EXAM: PORTABLE CHEST 1 VIEW COMPARISON:  07/08/2016 chest CT and chest radiograph. FINDINGS: Endotracheal tube 2.8 cm from the carina. Enteric tube extends below the field of view into the abdomen. Surgical clip at the gastroesophageal junction. Stable normal cardiac silhouette. Aortic calcific atherosclerosis. No focal consolidation, effusion, or pneumothorax. Mild stable biapical pleuroparenchymal scarring. IMPRESSION: Endotracheal tube 2.8 cm from carina. Enteric tube tip below field of view in the abdomen. No acute pulmonary process. Electronically Signed   By: Kristine Garbe M.D.   On: 02/13/2017 18:11    Assessment:   Melissa Cochran is a 81 y.o. female admitted with seizures, AMS, fevers and leukocytosis. MRI concerning for encephalitis. UA concerning for UTI and UCX with E coli. BCX 1/2 GPC. LP shows elevated wbc and rbc.  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 Listeria also possible based on LP.   Recommendations Continue acyclovir,  ceftriaxone, vanco and ampicillin HSV PCR pending  Thank you very much for allowing me to participate in the care of this patient. Please call with questions.   Cheral Marker. Ola Spurr, MD

## 2017-02-16 LAB — GLUCOSE, CAPILLARY
GLUCOSE-CAPILLARY: 127 mg/dL — AB (ref 65–99)
GLUCOSE-CAPILLARY: 129 mg/dL — AB (ref 65–99)
GLUCOSE-CAPILLARY: 130 mg/dL — AB (ref 65–99)
GLUCOSE-CAPILLARY: 134 mg/dL — AB (ref 65–99)
GLUCOSE-CAPILLARY: 135 mg/dL — AB (ref 65–99)
GLUCOSE-CAPILLARY: 139 mg/dL — AB (ref 65–99)
Glucose-Capillary: 133 mg/dL — ABNORMAL HIGH (ref 65–99)

## 2017-02-16 LAB — BASIC METABOLIC PANEL
ANION GAP: 10 (ref 5–15)
BUN: 29 mg/dL — AB (ref 6–20)
CHLORIDE: 98 mmol/L — AB (ref 101–111)
CO2: 28 mmol/L (ref 22–32)
Calcium: 8.5 mg/dL — ABNORMAL LOW (ref 8.9–10.3)
Creatinine, Ser: 0.73 mg/dL (ref 0.44–1.00)
Glucose, Bld: 146 mg/dL — ABNORMAL HIGH (ref 65–99)
POTASSIUM: 3.8 mmol/L (ref 3.5–5.1)
SODIUM: 136 mmol/L (ref 135–145)

## 2017-02-16 LAB — HERPES SIMPLEX VIRUS(HSV) DNA BY PCR
HSV 1 DNA: NEGATIVE
HSV 2 DNA: NEGATIVE

## 2017-02-16 LAB — MAGNESIUM: MAGNESIUM: 2.2 mg/dL (ref 1.7–2.4)

## 2017-02-16 LAB — URINE CULTURE

## 2017-02-16 LAB — PHOSPHORUS: PHOSPHORUS: 3.2 mg/dL (ref 2.5–4.6)

## 2017-02-16 MED ORDER — SODIUM CHLORIDE 0.9 % IV SOLN
2.0000 g | Freq: Three times a day (TID) | INTRAVENOUS | Status: DC
  Administered 2017-02-16 – 2017-02-20 (×12): 2 g via INTRAVENOUS
  Filled 2017-02-16 (×14): qty 2

## 2017-02-16 MED ORDER — AMIODARONE HCL IN DEXTROSE 360-4.14 MG/200ML-% IV SOLN
60.0000 mg/h | INTRAVENOUS | Status: DC
  Administered 2017-02-16: 60 mg/h via INTRAVENOUS
  Filled 2017-02-16: qty 200

## 2017-02-16 MED ORDER — METOPROLOL TARTRATE 5 MG/5ML IV SOLN
2.5000 mg | Freq: Four times a day (QID) | INTRAVENOUS | Status: DC | PRN
  Administered 2017-02-16 – 2017-02-18 (×3): 5 mg via INTRAVENOUS
  Administered 2017-02-20: 2.5 mg via INTRAVENOUS
  Administered 2017-02-21: 5 mg via INTRAVENOUS
  Filled 2017-02-16 (×6): qty 5

## 2017-02-16 MED ORDER — SODIUM CHLORIDE 0.9 % IV SOLN
1.0000 g | Freq: Three times a day (TID) | INTRAVENOUS | Status: DC
  Administered 2017-02-16: 1 g via INTRAVENOUS
  Filled 2017-02-16 (×3): qty 1

## 2017-02-16 MED ORDER — MEROPENEM 1 G IV SOLR
1.0000 g | Freq: Once | INTRAVENOUS | Status: AC
  Filled 2017-02-16: qty 1

## 2017-02-16 MED ORDER — AMIODARONE HCL IN DEXTROSE 360-4.14 MG/200ML-% IV SOLN
30.0000 mg/h | INTRAVENOUS | Status: DC
  Administered 2017-02-16 – 2017-02-21 (×9): 30 mg/h via INTRAVENOUS
  Filled 2017-02-16 (×10): qty 200

## 2017-02-16 MED ORDER — AMIODARONE LOAD VIA INFUSION
150.0000 mg | Freq: Once | INTRAVENOUS | Status: DC
  Filled 2017-02-16 (×2): qty 83.34

## 2017-02-16 NOTE — Progress Notes (Addendum)
[facility] INFECTIOUS DISEASE PROGRESS NOTE Date of Admission:  02/13/2017     ID: Melissa Cochran is a 81 y.o. 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   ROS  Unable to obtain  Medications:  Antibiotics Given (last 72 hours)    Date/Time Action Medication Dose Rate   02/13/17 1804 New Bag/Given   piperacillin-tazobactam (ZOSYN) IVPB 3.375 g 3.375 g 100 mL/hr   02/13/17 1859 New Bag/Given   vancomycin (VANCOCIN) IVPB 1000 mg/200 mL premix 1,000 mg 200 mL/hr   02/13/17 2125 New Bag/Given   cefTRIAXone (ROCEPHIN) 1 g in dextrose 5 % 50 mL IVPB 1 g 120 mL/hr   02/14/17 1113 New Bag/Given   vancomycin (VANCOCIN) IVPB 1000 mg/200 mL premix 1,000 mg 200 mL/hr   02/14/17 1305 New Bag/Given   ampicillin (OMNIPEN) 2 g in sodium chloride 0.9 % 50 mL IVPB 2 g 150 mL/hr   02/14/17 1329 New Bag/Given   cefTRIAXone (ROCEPHIN) 1 g in dextrose 5 % 50 mL IVPB 1 g 100 mL/hr   02/14/17 1353 New Bag/Given   acyclovir (ZOVIRAX) 650 mg in dextrose 5 % 100 mL IVPB 650 mg 113 mL/hr   02/14/17 1547 New Bag/Given   ampicillin (OMNIPEN) 2 g in sodium chloride 0.9 % 50 mL IVPB 2 g 150 mL/hr   02/14/17 1613 New Bag/Given   vancomycin (VANCOCIN) IVPB 1000 mg/200 mL premix 1,000 mg 200 mL/hr   02/14/17 2029 New Bag/Given   acyclovir (ZOVIRAX) 650 mg in dextrose 5 % 100 mL IVPB 650 mg 113 mL/hr   02/14/17 2030 New Bag/Given   ampicillin (OMNIPEN) 2 g in sodium chloride 0.9 % 50 mL IVPB 2 g 150 mL/hr   02/14/17 2203 New Bag/Given   cefTRIAXone (ROCEPHIN) 2 g in dextrose 5 % 50 mL IVPB 2 g 100 mL/hr   02/15/17 0013 New Bag/Given   ampicillin (OMNIPEN) 2 g in sodium chloride 0.9 % 50 mL IVPB 2 g 150 mL/hr   02/15/17 0306 New Bag/Given   acyclovir (ZOVIRAX) 650 mg in dextrose 5 % 100 mL IVPB 650 mg 113 mL/hr   02/15/17 0451 New Bag/Given   ampicillin (OMNIPEN) 2 g in sodium chloride 0.9 % 50 mL IVPB 2 g 150  mL/hr   02/15/17 0744 New Bag/Given   ampicillin (OMNIPEN) 2 g in sodium chloride 0.9 % 50 mL IVPB 2 g 150 mL/hr   02/15/17 1009 New Bag/Given   cefTRIAXone (ROCEPHIN) 2 g in dextrose 5 % 50 mL IVPB 2 g 100 mL/hr   02/15/17 1009 New Bag/Given   vancomycin (VANCOCIN) IVPB 1000 mg/200 mL premix 1,000 mg 200 mL/hr   02/15/17 1100 New Bag/Given   acyclovir (ZOVIRAX) 650 mg in dextrose 5 % 100 mL IVPB 650 mg 113 mL/hr   02/15/17 1226 New Bag/Given   ampicillin (OMNIPEN) 2 g in sodium chloride 0.9 % 50 mL IVPB 2 g 150 mL/hr   02/15/17 1624 New Bag/Given   ampicillin (OMNIPEN) 2 g in sodium chloride 0.9 % 50 mL IVPB 2 g 150 mL/hr   02/15/17 1934 New Bag/Given   acyclovir (ZOVIRAX) 650 mg in dextrose 5 % 100 mL IVPB 650 mg 113 mL/hr   02/15/17 2059 New Bag/Given   ampicillin (OMNIPEN) 2 g in sodium chloride 0.9 % 50 mL IVPB 2 g 150 mL/hr   02/15/17 2200 New Bag/Given   cefTRIAXone (ROCEPHIN) 2 g in dextrose 5 % 50 mL IVPB 2  g 100 mL/hr   02/15/17 2348 New Bag/Given   ampicillin (OMNIPEN) 2 g in sodium chloride 0.9 % 50 mL IVPB 2 g 150 mL/hr   02/16/17 0424 New Bag/Given   acyclovir (ZOVIRAX) 650 mg in dextrose 5 % 100 mL IVPB 650 mg 113 mL/hr   02/16/17 0451 New Bag/Given   ampicillin (OMNIPEN) 2 g in sodium chloride 0.9 % 50 mL IVPB 2 g 150 mL/hr   02/16/17 0455 New Bag/Given   vancomycin (VANCOCIN) IVPB 1000 mg/200 mL premix 1,000 mg 200 mL/hr   02/16/17 0805 New Bag/Given   ampicillin (OMNIPEN) 2 g in sodium chloride 0.9 % 50 mL IVPB 2 g 150 mL/hr   02/16/17 0918 New Bag/Given   cefTRIAXone (ROCEPHIN) 2 g in dextrose 5 % 50 mL IVPB 2 g 100 mL/hr   02/16/17 1005 New Bag/Given   meropenem (MERREM) 1 g in sodium chloride 0.9 % 100 mL IVPB 1 g 200 mL/hr   02/16/17 1146 New Bag/Given   ampicillin (OMNIPEN) 2 g in sodium chloride 0.9 % 50 mL IVPB 2 g 150 mL/hr     . amiodarone  150 mg Intravenous Once  . chlorhexidine gluconate (MEDLINE KIT)  15 mL Mouth Rinse BID  . feeding supplement  (PRO-STAT SUGAR FREE 64)  30 mL Per Tube BID  . feeding supplement (VITAL 1.5 CAL)  1,000 mL Per Tube Q24H  . free water  175 mL Per Tube Q6H  . mouth rinse  15 mL Mouth Rinse 10 times per day  . multivitamin  15 mL Per Tube Daily  . pantoprazole (PROTONIX) IV  40 mg Intravenous Q24H  . potassium & sodium phosphates  1 packet Oral Q6H  . senna-docusate  1 tablet Per Tube BID    Objective: Vital signs in last 24 hours: Temp:  [98.2 F (36.8 C)-101.8 F (38.8 C)] 98.4 F (36.9 C) (10/10 1200) Pulse Rate:  [49-113] 68 (10/10 1300) Resp:  [7-28] 19 (10/10 1300) BP: (84-164)/(49-101) 164/59 (10/10 1300) SpO2:  [98 %-100 %] 100 % (10/10 1300) FiO2 (%):  [28 %] 28 % (10/10 1200) Weight:  [70.4 kg (155 lb 3.3 oz)] 70.4 kg (155 lb 3.3 oz) (10/10 0451) Constitutional:  Intubated, unresponsive  HENT: , anicteric Mouth/Throat: ETT in place  Cardiovascular: Normal rate, regular rhythm and normal heart sounds. -Pulmonary/Chest: Effort normal and breath sounds normal. No respiratory distress.  has no wheezes.  Neck = supple, no nuchal rigidity Abdominal: Soft. Bowel sounds are normal.  exhibits no distension. There is no tenderness.  Lymphadenopathy: no cervical adenopathy. No axillary adenopathy Neurological: unresponsive Skin: Skin is warm and dry. No rash noted. No erythema.  Psychiatric unresponsive  Lab Results  Recent Labs  02/13/17 1729 02/14/17 0257 02/15/17 0506 02/16/17 0906  WBC 12.6* 17.0*  --   --   HGB 11.2* 12.8  --   --   HCT 34.5* 39.3  --   --   NA 141 142 138 136  K 4.8 3.6 3.4* 3.8  CL 108 106 104 98*  CO2 '22 25 27 28  '$ BUN 18 14 21* 29*  CREATININE 1.03* 0.89 0.70 0.73    Microbiology: Results for orders placed or performed during the hospital encounter of 02/13/17  Blood Culture (routine x 2)     Status: None (Preliminary result)   Collection Time: 02/13/17  5:29 PM  Result Value Ref Range Status   Specimen Description BLOOD LEFT HAND  Final   Special  Requests  Final    BOTTLES DRAWN AEROBIC AND ANAEROBIC Blood Culture adequate volume   Culture NO GROWTH 3 DAYS  Final   Report Status PENDING  Incomplete  Blood Culture (routine x 2)     Status: Abnormal (Preliminary result)   Collection Time: 02/13/17  5:29 PM  Result Value Ref Range Status   Specimen Description BLOOD RIGHT HAND  Final   Special Requests   Final    BOTTLES DRAWN AEROBIC AND ANAEROBIC Blood Culture adequate volume   Culture  Setup Time   Final    GRAM POSITIVE COCCI AEROBIC BOTTLE ONLY CRITICAL RESULT CALLED TO, READ BACK BY AND VERIFIED WITH: GARRETT COFFEE AT 1203 02/15/17 SDR Performed at [facility], [place] [address], [place], [place] [address]    Culture STAPHYLOCOCCUS SPECIES (COAGULASE NEGATIVE) (A)  Final   Report Status PENDING  Incomplete  Urine culture     Status: Abnormal   Collection Time: 02/13/17  5:29 PM  Result Value Ref Range Status   Specimen Description URINE, RANDOM  Final   Special Requests NONE  Final   Culture (A)  Final    >=100,000 COLONIES/mL ESCHERICHIA COLI Confirmed Extended Spectrum Beta-Lactamase Producer (ESBL).  In bloodstream infections from ESBL organisms, carbapenems are preferred over piperacillin/tazobactam. They are shown to have a lower risk of mortality. Performed at [facility], [place] [address] [place], [place] [address]    Report Status 02/16/2017 FINAL  Final   Organism ID, Bacteria ESCHERICHIA COLI (A)  Final      Susceptibility   Escherichia coli - MIC*    AMPICILLIN >=32 RESISTANT Resistant     CEFAZOLIN >=64 RESISTANT Resistant     CEFTRIAXONE >=64 RESISTANT Resistant     CIPROFLOXACIN >=4 RESISTANT Resistant     GENTAMICIN <=1 SENSITIVE Sensitive     IMIPENEM <=0.25 SENSITIVE Sensitive     NITROFURANTOIN <=16 SENSITIVE Sensitive     TRIMETH/SULFA >=320 RESISTANT Resistant     AMPICILLIN/SULBACTAM >=32 RESISTANT Resistant     PIP/TAZO <=4 SENSITIVE Sensitive     Extended ESBL POSITIVE Resistant      * >=100,000 COLONIES/mL ESCHERICHIA COLI  Blood Culture ID Panel (Reflexed)     Status: None   Collection Time: 02/13/17  5:29 PM  Result Value Ref Range Status   Enterococcus species NOT DETECTED NOT DETECTED Final   Listeria monocytogenes NOT DETECTED NOT DETECTED Final   Staphylococcus species NOT DETECTED NOT DETECTED Final   Staphylococcus aureus NOT DETECTED NOT DETECTED Final   Streptococcus species NOT DETECTED NOT DETECTED Final   Streptococcus agalactiae NOT DETECTED NOT DETECTED Final   Streptococcus pneumoniae NOT DETECTED NOT DETECTED Final   Streptococcus pyogenes NOT DETECTED NOT DETECTED Final   Acinetobacter baumannii NOT DETECTED NOT DETECTED Final   Enterobacteriaceae species NOT DETECTED NOT DETECTED Final   Enterobacter cloacae complex NOT DETECTED NOT DETECTED Final   Escherichia coli NOT DETECTED NOT DETECTED Final   Klebsiella oxytoca NOT DETECTED NOT DETECTED Final   Klebsiella pneumoniae NOT DETECTED NOT DETECTED Final   Proteus species NOT DETECTED NOT DETECTED Final   Serratia marcescens NOT DETECTED NOT DETECTED Final   Haemophilus influenzae NOT DETECTED NOT DETECTED Final   Neisseria meningitidis NOT DETECTED NOT DETECTED Final   Pseudomonas aeruginosa NOT DETECTED NOT DETECTED Final   Candida albicans NOT DETECTED NOT DETECTED Final   Candida glabrata NOT DETECTED NOT DETECTED Final   Candida krusei NOT DETECTED NOT DETECTED Final   Candida parapsilosis NOT  DETECTED NOT DETECTED Final   Candida tropicalis NOT DETECTED NOT DETECTED Final  MRSA PCR Screening     Status: None   Collection Time: 02/14/17  1:23 AM  Result Value Ref Range Status   MRSA by PCR NEGATIVE NEGATIVE Final    Comment:        The GeneXpert MRSA Assay (FDA approved for NASAL specimens only), is one component of a comprehensive MRSA colonization surveillance program. It is not intended to diagnose MRSA infection nor to guide or monitor treatment for MRSA infections.    CSF culture     Status: None (Preliminary result)   Collection Time: 02/15/17 12:49 PM  Result Value Ref Range Status   Specimen Description CSF  Final   Special Requests Normal  Final   Gram Stain   Final     MODERATE WBC SEEN NO ORGANISMS SEEN RARE RBC SEEN    Culture   Final    NO GROWTH < 12 HOURS Performed at [facility], [place] [address], [place], [place] [address]    Report Status PENDING  Incomplete    Studies/Results: Ct Lumbar Spine Wo Contrast  Result Date: 02/15/2017 CLINICAL DATA:  Meningitis due to other and unspecified causes. Rule out hemorrhage after lumbar puncture today. EXAM: CT LUMBAR SPINE WITHOUT CONTRAST TECHNIQUE: Multidetector CT imaging of the lumbar spine was performed without intravenous contrast administration. Multiplanar CT image reconstructions were also generated. COMPARISON:  Abdominal CT 07/08/2016 FINDINGS: Segmentation: 5 lumbar type vertebrae. Alignment: Moderate levoscoliosis.  Leftward translation at L3-4. Vertebrae: Negative for acute fracture, discitis, or aggressive bone lesion. Advanced endplate irregularity at L3-4 and L4-5 which is chronic and considered degenerative. Canal: Left paracentral dorsal epidural gas at the level of L2 correlating with recent LP history. No evidence of intraspinal hemorrhage, either within the thecal sac or epidural space. Paraspinal and other soft tissues: No evidence of hematoma in the posterior soft tissues. Atherosclerosis, retroperitoneal edema, and left nephrolithiasis. Postoperative changes at the proximal stomach. Disc levels: T12- L1: Mild facet spurring.  No evidence of impingement L1-L2: Mild facet spurring.  No evidence of impingement L2-L3: Mild facet spurring. Disc narrowing asymmetric to the right with bulging. No evidence of impingement L3-L4: Advanced degenerative disc narrowing asymmetric towards the right. Asymmetric right facet spurring. Moderate right foraminal stenosis. Mild bilateral  subarticular recess narrowing. L4-L5: Advanced asymmetric rightward degenerative disc narrowing. Asymmetric left degenerative facet hypertrophy. Ligamentum flavum thickening. There is moderate to advanced spinal stenosis. Left foraminal L4 impingement due to disc height loss and bulging. L5-S1:Mild disc narrowing and bulging.  No evidence of impingement. IMPRESSION: 1. No acute or unexpected finding. No evidence of postprocedural hemorrhage. 2. Scoliosis and advanced L3-4 and L4-5 disc degeneration with stenoses described above. Electronically Signed   By: Monte Fantasia M.D.   On: 02/15/2017 16:15    Assessment/Plan: Melissa Cochran is a 81 y.o. female admitted with seizures, AMS, fevers and leukocytosis. MRI concerning for encephalitis. UA concerning for UTI and UCX with E coli. BCX 1/2 GPC. LP shows elevated wbc and rbc.  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 Listeria also possible based on LP.   Recommendations Continue acyclovir, vanco. Meropenem added to cover the ESBL E coli in urine and will also cover listera HSV PCR pending Thank you very much for the consult. Will follow with you.  Leonel Ramsay   02/16/2017, 1:51 PM

## 2017-02-16 NOTE — Progress Notes (Signed)
Pharmacy Antibiotic Note  Melissa Cochran is a 81 y.o. 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 Also treating for UTI due to ESBL e coli.  Pharmacy has been consulted for meropenem dosing.  Plan: Continue empiric antimicrobials for CNS infection, awaiting cultures from LP and HSV PCR. Acyclovir 650 mg IV Q 8 H, ampicillin 2 g IV Q 4 H, vancomycin 1 g IV Q 18 H. Discontinue ceftriaxone and switch to meropenem to allow coverage for both meningitis and ESBL e coli. Initiate meropenem 2 g IV Q 8 H.  Height: 5\' 5"  (165.1 cm) Weight: 155 lb 3.3 oz (70.4 kg) IBW/kg (Calculated) : 57  Temp (24hrs), Avg:100.2 F (37.9 C), Min:98.2 F (36.8 C), Max:101.8 F (38.8 C)   Recent Labs Lab 02/13/17 1729 02/13/17 2135 02/14/17 0257 02/15/17 0506 02/16/17 0906  WBC 12.6*  --  17.0*  --   --   CREATININE 1.03*  --  0.89 0.70 0.73  LATICACIDVEN 5.8* 3.6*  --   --   --     Estimated Creatinine Clearance: 56.2 mL/min (by C-G formula based on SCr of 0.73 mg/dL).    No Known Allergies  Antimicrobials this admission: Zosyn 10/7 X 1 Acyclovir 10/8 >> Vancomycin 10/8 >>  Ampicillin 10/8 >> Ceftriaxone 10/8 >> 10/10 Meropenem 10/10 >>   Dose adjustments this admission:   Microbiology results: 10/7 BCx: gram positive cocci 10/7 BCx: no growth 3 days 10/7 UCx: ESBL e coli  10/8 MRSA PCR: negative 10/9 CSF Cx: no growth < 12 hours   Thank you for allowing pharmacy to be a part of this patient's care.  Oralia Manis 02/16/2017 11:31 AM

## 2017-02-16 NOTE — H&P (Signed)
PATIENT NAME: Melissa Cochran    MR#:  [id number]  DATE OF BIRTH:  May 07, 1936  DATE OF ADMISSION:  02/13/2017  PRIMARY CARE PHYSICIAN: Teodoro Spray, MD   REQUESTING/REFERRING PHYSICIAN: Merlyn Lot, MD  CHIEF COMPLAINT:   Chief Complaint  Patient presents with  . Seizures   Unresponsiveness and the seizure activity today. HISTORY OF PRESENT ILLNESS:   Patient with +CNS infection Multiple abx Poor prognosis Remains critically ill On full vent support    DRUG ALLERGIES:  No Known Allergies  REVIEW OF SYSTEMS:   Review of Systems  Unable to perform ROS: Intubated      VITAL SIGNS:  Blood pressure (!) 144/67, pulse (!) 111, temperature 98.2 F (36.8 C), temperature source Axillary, resp. rate 19, height 5\' 5"  (1.651 m), weight 155 lb 3.3 oz (70.4 kg), SpO2 100 %.  PHYSICAL EXAMINATION:  Physical Exam  GENERAL:  81 y.o.-year-old patient lying in the bed, on Ventilation.  EYES: Pupils equal, round, reactive to light. No scleral icterus. HEENT: Head atraumatic, normocephalic.  NECK:  Supple, no jugular venous distention.  LUNGS: Normal breath sounds bilaterally, no wheezing, rales,rhonchi or crepitation. CARDIOVASCULAR: S1, S2 normal. No murmurs, rubs, or gallops.  ABDOMEN: Soft, nontender, nondistended. Bowel sounds present. No organomegaly or mass.  EXTREMITIES: No pedal edema, cyanosis, or clubbing.  NEUROLOGIC: Unable to exam.  PSYCHIATRIC: The patient is intubated and sedated. SKIN: No obvious rash, lesion, or ulcer.   LABORATORY PANEL:   CBC  Recent Labs Lab 02/14/17 0257  WBC 17.0*  HGB 12.8  HCT 39.3  PLT 307   ------------------------------------------------------------------------------------------------------------------  Chemistries   Recent Labs Lab 02/13/17 1729  02/15/17 0506 02/15/17 1735  NA 141  < > 138  --   K 4.8  < > 3.4*  --   CL 108  < > 104  --   CO2 22  < > 27  --   GLUCOSE 91  < > 174*  --   BUN 18  < >  21*  --   CREATININE 1.03*  < > 0.70  --   CALCIUM 8.5*  < > 8.7*  --   MG  --   < > 2.2 2.2  AST 72*  --   --   --   ALT 13*  --   --   --   ALKPHOS 120  --   --   --   BILITOT 0.5  --   --   --   < > = values in this interval not displayed. ------------------------------------------------------------------------------------------------------------------  Cardiac Enzymes  Recent Labs Lab 02/14/17 0257  TROPONINI 15.17*      IMPRESSION AND PLAN:   81 yo white female with acute encephalopathy from acute CVA and encephalitis with severe resp failure with status epilepticus  Status epilepticus The patient will be admitted to ICU, continue sedation medication IV, Keppra IV and neurology consult.  CN S infection/CVA Follow up neuro recs Continue IV abx   Acute respiratory failure with hypoxia continue vent support Airway support  Sepsis due to UTI sepsis protocol, continue antibiotics, follow-up CBC and cultures. Elevated troponin. Possible due to demanding ischemia secondary to seizure, respiratory failure and sepsis.  Follow-up troponin level and cardiology consult.  Accelerated hypertension. IV hydralazine when necessary. COPD. Stable. Chronic diastolic CHF. Stable.    Critical Care Time devoted to patient care services described in this note is 34 minutes.   Overall, patient is critically ill, prognosis is guarded.  Patient with Multiorgan  failure and at high risk for cardiac arrest and death.    Corrin Parker, M.D.  [facility]  Medical Director [place] Director [facility] Department

## 2017-02-16 NOTE — Progress Notes (Signed)
[facility] NAME: Melissa Cochran    MR#:  [id number]  DATE OF BIRTH:  05-16-1935  SUBJECTIVE:  CHIEF COMPLAINT:   Chief Complaint  Patient presents with  . Seizures    came with AMS, Seizures and fever. Intubated for airway protection.  MRI noted to have likely  Changes of Encephalitis  Seen by neurology also, as was on anticoagulant, advised to give broad spectrum ABx   LP done today. Pt remains comatose.  REVIEW OF SYSTEMS:  Pt is intubated, can not give ROS.  ROS  DRUG ALLERGIES:  No Known Allergies  VITALS:  Blood pressure (!) 144/67, pulse (!) 111, temperature 98.2 F (36.8 C), temperature source Axillary, resp. rate 19, height 5\' 5"  (1.651 m), weight 70.4 kg (155 lb 3.3 oz), SpO2 100 %.  PHYSICAL EXAMINATION:  GENERAL:  81 y.o.-year-old patient lying in the bed with no acute distress.  EYES: Pupils equal, round, reactive to light . No scleral icterus.   HEENT: Head atraumatic, normocephalic. Oropharynx and nasopharynx clear.  NECK:  Supple, no jugular venous distention. No thyroid enlargement, no tenderness.  LUNGS: Normal breath sounds bilaterally, no wheezing, rales,rhonchi or crepitation. No use of accessory muscles of respiration. ETT , on vent support. CARDIOVASCULAR: S1, S2 normal. No murmurs, rubs, or gallops.  ABDOMEN: Soft, nontender, nondistended. Bowel sounds present. No organomegaly or mass.  EXTREMITIES: No pedal edema, cyanosis, or clubbing.  NEUROLOGIC: on vent. Off sedation meds since morning, still not waking up, no sedation. PSYCHIATRIC: The patient is comatose.  SKIN: No obvious rash, lesion, or ulcer.   Physical Exam LABORATORY PANEL:   CBC  Recent Labs Lab 02/14/17 0257  WBC 17.0*  HGB 12.8  HCT 39.3  PLT 307   ------------------------------------------------------------------------------------------------------------------  Chemistries   Recent Labs Lab 02/13/17 1729   02/15/17 0506 02/15/17 1735  NA 141  < > 138  --   K 4.8  < > 3.4*  --   CL 108  < > 104  --   CO2 22  < > 27  --   GLUCOSE 91  < > 174*  --   BUN 18  < > 21*  --   CREATININE 1.03*  < > 0.70  --   CALCIUM 8.5*  < > 8.7*  --   MG  --   < > 2.2 2.2  AST 72*  --   --   --   ALT 13*  --   --   --   ALKPHOS 120  --   --   --   BILITOT 0.5  --   --   --   < > = values in this interval not displayed. ------------------------------------------------------------------------------------------------------------------  Cardiac Enzymes  Recent Labs Lab 02/13/17 2135 02/14/17 0257  TROPONINI 11.29* 15.17*   ------------------------------------------------------------------------------------------------------------------  RADIOLOGY:  Ct Lumbar Spine Wo Contrast  Result Date: 02/15/2017 CLINICAL DATA:  Meningitis due to other and unspecified causes. Rule out hemorrhage after lumbar puncture today. EXAM: CT LUMBAR SPINE WITHOUT CONTRAST TECHNIQUE: Multidetector CT imaging of the lumbar spine was performed without intravenous contrast administration. Multiplanar CT image reconstructions were also generated. COMPARISON:  Abdominal CT 07/08/2016 FINDINGS: Segmentation: 5 lumbar type vertebrae. Alignment: Moderate levoscoliosis.  Leftward translation at L3-4. Vertebrae: Negative for acute fracture, discitis, or aggressive bone lesion. Advanced endplate irregularity at L3-4 and L4-5 which is chronic and considered degenerative. Canal: Left paracentral dorsal epidural gas at the level of L2 correlating with  recent LP history. No evidence of intraspinal hemorrhage, either within the thecal sac or epidural space. Paraspinal and other soft tissues: No evidence of hematoma in the posterior soft tissues. Atherosclerosis, retroperitoneal edema, and left nephrolithiasis. Postoperative changes at the proximal stomach. Disc levels: T12- L1: Mild facet spurring.  No evidence of impingement L1-L2: Mild facet  spurring.  No evidence of impingement L2-L3: Mild facet spurring. Disc narrowing asymmetric to the right with bulging. No evidence of impingement L3-L4: Advanced degenerative disc narrowing asymmetric towards the right. Asymmetric right facet spurring. Moderate right foraminal stenosis. Mild bilateral subarticular recess narrowing. L4-L5: Advanced asymmetric rightward degenerative disc narrowing. Asymmetric left degenerative facet hypertrophy. Ligamentum flavum thickening. There is moderate to advanced spinal stenosis. Left foraminal L4 impingement due to disc height loss and bulging. L5-S1:Mild disc narrowing and bulging.  No evidence of impingement. IMPRESSION: 1. No acute or unexpected finding. No evidence of postprocedural hemorrhage. 2. Scoliosis and advanced L3-4 and L4-5 disc degeneration with stenoses described above. Electronically Signed   By: Monte Fantasia M.D.   On: 02/15/2017 16:15   Mr Jeri Cos SJ Contrast  Addendum Date: 02/14/2017   ADDENDUM REPORT: 05/16/202018 13:27 ADDENDUM: Study discussed by telephone with Dr. Flora Lipps on 02/14/2017 at 1321 hours, and he advised that the patient did present with high fever (102, 103 F) and was started on empiric antiviral herpes treatment at admission. LP for CSF analysis planned for tomorrow. Electronically Signed   By: Genevie Ann M.D.   On: 05/16/202018 13:27   Result Date: 02/14/2017 CLINICAL DATA:  81 year old female became unresponsive after feeling un-well. Seizure, hypoxia. Intubated. EXAM: MRI HEAD WITHOUT AND WITH CONTRAST TECHNIQUE: Multiplanar, multiecho pulse sequences of the brain and surrounding structures were obtained without and with intravenous contrast. CONTRAST:  14 mL MultiHance COMPARISON:  Head CT without contrast 02/13/2017 and earlier. FINDINGS: Brain: Multifocal abnormal cortical diffusion, T2, and FLAIR hyperintensity. The most intense areas of abnormal diffusion are in the right parietal lobe and right occipital pole cortex and  subcortical white matter (series 100, images 29 and 34). However, there is mild to moderate cortical signal abnormality tracking along the right insula, the dorsal and medial right thalamus (series 100, image 28), the right hippocampus and mesial temporal lobe, and also the right insula. See series 101 images 27 and 28. The occipital foci demonstrate confluent T2 and FLAIR hyperintensity. There is more subtle T2 and FLAIR hyperintensity in the other areas. No associated hemorrhage or mass effect. No associated enhancement in these areas. No definite contralateral left temporal lobe or insula involvement. However, there are small intense foci of diffusion abnormality also in both superior cerebellar hemispheres (series 100, images 15-18). No brainstem involvement. Following contrast the only abnormal area of enhancement is a round 2.3 cm meningioma along the right posterior frontal convexity (series 14, image 11). No dural thickening. Patchy and confluent scattered bilateral cerebral white matter T2 and FLAIR hyperintensity is nonspecific but appears chronic on diffusion. Similar patchy T2 hyperintensity in the left thalamus which is facilitated on diffusion. No midline shift, ventriculomegaly, extra-axial fluid collection or acute intracranial hemorrhage. Cervicomedullary junction and pituitary are within normal limits. Vascular: Major intracranial vascular flow voids are preserved. Skull and upper cervical spine: Negative. Visualized bone marrow signal is within normal limits. Sinuses/Orbits: Normal orbit soft tissues. Paranasal sinuses are clear. Other: Intubated with fluid layering in the pharynx. Mild mastoid effusions. Visible internal auditory structures appear normal. Oral enteric tube also in place. Scalp soft tissues appear negative.  IMPRESSION: 1. Multifocal acute abnormal signal in the brain (see #2 and 3). No associated hemorrhage, enhancement, or mass effect. 2. Abnormal signal in the right mesial  temporal lobe, right thalamus, and right cingulate gyrus is nonspecific and might be postictal, but consider Acute Herpes Encephalitis if the patient is febrile and recommend empiric antiviral treatment. 3. Abnormal signal in the right occipital pole, posterior right parietal lobe, and both cerebellar hemispheres most resembles several acute infarcts. 4. A chronic 2.3 cm posterior right frontal convexity meningioma with mild regional mass effect but no associated edema is probably clinically silent. 5. Patchy bilateral cerebral white matter and left thalamic signal abnormality, nonspecific but may be related to chronic small vessel disease. Electronically Signed: By: Genevie Ann M.D. On: 08-03-202018 13:14    ASSESSMENT AND PLAN:   Active Problems:   Convulsive status epilepticus (HCC)  * Acute encephalopathy- Altered mental status   Likely meningitis Vs encephalitis   On broad spectrum Abx.   LP done today , follow results.   Pt remains comatose, no use of sedation meds.   CT lumber spine as had some blood at end of procedure ( Was on anticoagulant 2 days ago)  * Status epilepticus  continue sedation medication IV, Keppra IV and neurology consult.  * Acute respiratory failure with hypoxia Continue ventilation and intensivist consult.  * Sepsis due to UTI and meningitis  sepsis protocol, continue antibiotics, follow-up CBC and cultures.  ID consult to help.  * Elevated troponin. Possible due to demanding ischemia secondary to seizure, respiratory failure and sepsis.     Echo.  * Accelerated hypertension. IV hydralazine when necessary. * COPD. Stable. * Chronic diastolic CHF. Stable.   All the records are reviewed and case discussed with Care Management/Social Workerr. Management plans discussed with the patient, family and they are in agreement.  CODE STATUS: Full.  TOTAL TIME TAKING CARE OF THIS PATIENT: 35 minutes.     POSSIBLE D/C IN 2-3 DAYS, DEPENDING ON CLINICAL  CONDITION.   Vaughan Basta M.D on 02/16/2017   Between 7am to 6pm - Pager - [phone]  After 6pm go to [email or web address] - password [email or web address] [facility]  Office  [phone]  CC: Primary care physician; Teodoro Spray, MD  Note: This dictation was prepared with Dragon dictation along with smaller phrase technology. Any transcriptional errors that result from this process are unintentional.

## 2017-02-16 NOTE — Progress Notes (Signed)
Pt remains intubated overnight with no signs of purposeful movements. Pt d/c on off Dilt gtt and started on Amio gtt to lower HR and low BPs.

## 2017-02-16 NOTE — Progress Notes (Addendum)
Subjective: Patient remains unresponsive.  Febrile.  Objective: Current vital signs: BP (!) 164/59   Pulse 68   Temp 98.4 F (36.9 C) (Oral)   Resp 19   Ht '5\' 5"'$  (1.651 m)   Wt 70.4 kg (155 lb 3.3 oz)   SpO2 100%   BMI 25.83 kg/m  Vital signs in last 24 hours: Temp:  [98.2 F (36.8 C)-101.8 F (38.8 C)] 98.4 F (36.9 C) (10/10 1200) Pulse Rate:  [49-113] 68 (10/10 1300) Resp:  [7-28] 19 (10/10 1300) BP: (84-164)/(49-101) 164/59 (10/10 1300) SpO2:  [98 %-100 %] 100 % (10/10 1300) FiO2 (%):  [28 %] 28 % (10/10 1200) Weight:  [70.4 kg (155 lb 3.3 oz)] 70.4 kg (155 lb 3.3 oz) (10/10 0451)  Intake/Output from previous day: 10/09 0701 - 10/10 0700 In: 2375.3 [I.V.:212.3; NG/GT:640; IV Piggyback:1483] Out: 620 [Urine:620] Intake/Output this shift: Total I/O In: 1122.7 [I.V.:132.7; NG/GT:640; IV Piggyback:350] Out: 400 [Urine:100; Stool:300] Nutritional status: Diet NPO time specified  Neurologic Exam: Mental Status: Patient does not respond to verbal stimuli. With deep sternal rub localizes with the RUE. Does not follow commands. No verbalizations noted.  Cranial Nerves: II: patient does not respond confrontation bilaterally, pupils right 81m, left 373mand sluggishly reactivebilaterally III,IV,VI: Absent oculocephalic maneuvers bilaterally.  V,VII: corneal reflex present bilaterally VIII: patient does not respond to verbal stimuli IX,X: gag reflex unable to test, XI: trapezius strength unable to test bilaterally XII: tongue strength unable to test Motor: RUE localization Sensory: Does not respond to noxious stimuli in any extremity. Plantars: Upgoing on the left Cerebellar: Unable to perform   Lab Results: Basic Metabolic Panel:  Recent Labs Lab 02/13/17 1729 02/14/17 0257 02/14/17 1330 02/15/17 0506 02/15/17 1735 02/16/17 0906  NA 141 142  --  138  --  136  K 4.8 3.6  --  3.4*  --  3.8  CL 108 106  --  104  --  98*  CO2 22 25  --  27  --  28   GLUCOSE 91 126*  --  174*  --  146*  BUN 18 14  --  21*  --  29*  CREATININE 1.03* 0.89  --  0.70  --  0.73  CALCIUM 8.5* 8.5*  --  8.7*  --  8.5*  MG  --   --  1.9 2.2 2.2 2.2  PHOS  --   --  3.5 1.8* 2.1* 3.2    Liver Function Tests:  Recent Labs Lab 02/13/17 1729  AST 72*  ALT 13*  ALKPHOS 120  BILITOT 0.5  PROT 5.8*  ALBUMIN 2.7*   No results for input(s): LIPASE, AMYLASE in the last 168 hours. No results for input(s): AMMONIA in the last 168 hours.  CBC:  Recent Labs Lab 02/13/17 1729 02/14/17 0257  WBC 12.6* 17.0*  NEUTROABS 11.8*  --   HGB 11.2* 12.8  HCT 34.5* 39.3  MCV 91.3 89.3  PLT 363 307    Cardiac Enzymes:  Recent Labs Lab 02/13/17 1729 02/13/17 2135 02/14/17 0257  TROPONINI 1.95* 11.29* 15.17*    Lipid Panel: No results for input(s): CHOL, TRIG, HDL, CHOLHDL, VLDL, LDLCALC in the last 168 hours.  CBG:  Recent Labs Lab 02/15/17 1931 02/15/17 2358 02/16/17 0423 02/16/17 0730 02/16/17 1140  GLUCAP 150* 135* 130* 133* 1214   Microbiology: Results for orders placed or performed during the hospital encounter of 02/13/17  Blood Culture (routine x 2)     Status: None (Preliminary result)  Collection Time: 02/13/17  5:29 PM  Result Value Ref Range Status   Specimen Description BLOOD LEFT HAND  Final   Special Requests   Final    BOTTLES DRAWN AEROBIC AND ANAEROBIC Blood Culture adequate volume   Culture NO GROWTH 3 DAYS  Final   Report Status PENDING  Incomplete  Blood Culture (routine x 2)     Status: Abnormal (Preliminary result)   Collection Time: 02/13/17  5:29 PM  Result Value Ref Range Status   Specimen Description BLOOD RIGHT HAND  Final   Special Requests   Final    BOTTLES DRAWN AEROBIC AND ANAEROBIC Blood Culture adequate volume   Culture  Setup Time   Final    GRAM POSITIVE COCCI AEROBIC BOTTLE ONLY CRITICAL RESULT CALLED TO, READ BACK BY AND VERIFIED WITH: GARRETT COFFEE AT 1203 02/15/17 SDR Performed at [facility], [place] [address] [place], [place] [address]    Culture STAPHYLOCOCCUS SPECIES (COAGULASE NEGATIVE) (A)  Final   Report Status PENDING  Incomplete  Urine culture     Status: Abnormal   Collection Time: 02/13/17  5:29 PM  Result Value Ref Range Status   Specimen Description URINE, RANDOM  Final   Special Requests NONE  Final   Culture (A)  Final    >=100,000 COLONIES/mL ESCHERICHIA COLI Confirmed Extended Spectrum Beta-Lactamase Producer (ESBL).  In bloodstream infections from ESBL organisms, carbapenems are preferred over piperacillin/tazobactam. They are shown to have a lower risk of mortality. Performed at [facility], [place] [address] [place], [place] [address]    Report Status 02/16/2017 FINAL  Final   Organism ID, Bacteria ESCHERICHIA COLI (A)  Final      Susceptibility   Escherichia coli - MIC*    AMPICILLIN >=32 RESISTANT Resistant     CEFAZOLIN >=64 RESISTANT Resistant     CEFTRIAXONE >=64 RESISTANT Resistant     CIPROFLOXACIN >=4 RESISTANT Resistant     GENTAMICIN <=1 SENSITIVE Sensitive     IMIPENEM <=0.25 SENSITIVE Sensitive     NITROFURANTOIN <=16 SENSITIVE Sensitive     TRIMETH/SULFA >=320 RESISTANT Resistant     AMPICILLIN/SULBACTAM >=32 RESISTANT Resistant     PIP/TAZO <=4 SENSITIVE Sensitive     Extended ESBL POSITIVE Resistant     * >=100,000 COLONIES/mL ESCHERICHIA COLI  Blood Culture ID Panel (Reflexed)     Status: None   Collection Time: 02/13/17  5:29 PM  Result Value Ref Range Status   Enterococcus species NOT DETECTED NOT DETECTED Final   Listeria monocytogenes NOT DETECTED NOT DETECTED Final   Staphylococcus species NOT DETECTED NOT DETECTED Final   Staphylococcus aureus NOT DETECTED NOT DETECTED Final   Streptococcus species NOT DETECTED NOT DETECTED Final   Streptococcus agalactiae NOT DETECTED NOT DETECTED Final   Streptococcus pneumoniae NOT DETECTED NOT DETECTED Final   Streptococcus pyogenes NOT DETECTED NOT DETECTED Final    Acinetobacter baumannii NOT DETECTED NOT DETECTED Final   Enterobacteriaceae species NOT DETECTED NOT DETECTED Final   Enterobacter cloacae complex NOT DETECTED NOT DETECTED Final   Escherichia coli NOT DETECTED NOT DETECTED Final   Klebsiella oxytoca NOT DETECTED NOT DETECTED Final   Klebsiella pneumoniae NOT DETECTED NOT DETECTED Final   Proteus species NOT DETECTED NOT DETECTED Final   Serratia marcescens NOT DETECTED NOT DETECTED Final   Haemophilus influenzae NOT DETECTED NOT DETECTED Final   Neisseria meningitidis NOT DETECTED NOT DETECTED Final   Pseudomonas aeruginosa NOT DETECTED NOT DETECTED Final   Candida albicans  NOT DETECTED NOT DETECTED Final   Candida glabrata NOT DETECTED NOT DETECTED Final   Candida krusei NOT DETECTED NOT DETECTED Final   Candida parapsilosis NOT DETECTED NOT DETECTED Final   Candida tropicalis NOT DETECTED NOT DETECTED Final  MRSA PCR Screening     Status: None   Collection Time: 02/14/17  1:23 AM  Result Value Ref Range Status   MRSA by PCR NEGATIVE NEGATIVE Final    Comment:        The GeneXpert MRSA Assay (FDA approved for NASAL specimens only), is one component of a comprehensive MRSA colonization surveillance program. It is not intended to diagnose MRSA infection nor to guide or monitor treatment for MRSA infections.   CSF culture     Status: None (Preliminary result)   Collection Time: 02/15/17 12:49 PM  Result Value Ref Range Status   Specimen Description CSF  Final   Special Requests Normal  Final   Gram Stain   Final     MODERATE WBC SEEN NO ORGANISMS SEEN RARE RBC SEEN    Culture   Final    NO GROWTH < 12 HOURS Performed at [facility], [place] [address], [place], [place] [address]    Report Status PENDING  Incomplete    Coagulation Studies:  Recent Labs  02/13/17 1729 02/15/17 1027  LABPROT 15.7* 15.8*  INR 1.26 1.27    Imaging: Ct Lumbar Spine Wo Contrast  Result Date: 02/15/2017 CLINICAL DATA:   Meningitis due to other and unspecified causes. Rule out hemorrhage after lumbar puncture today. EXAM: CT LUMBAR SPINE WITHOUT CONTRAST TECHNIQUE: Multidetector CT imaging of the lumbar spine was performed without intravenous contrast administration. Multiplanar CT image reconstructions were also generated. COMPARISON:  Abdominal CT 07/08/2016 FINDINGS: Segmentation: 5 lumbar type vertebrae. Alignment: Moderate levoscoliosis.  Leftward translation at L3-4. Vertebrae: Negative for acute fracture, discitis, or aggressive bone lesion. Advanced endplate irregularity at L3-4 and L4-5 which is chronic and considered degenerative. Canal: Left paracentral dorsal epidural gas at the level of L2 correlating with recent LP history. No evidence of intraspinal hemorrhage, either within the thecal sac or epidural space. Paraspinal and other soft tissues: No evidence of hematoma in the posterior soft tissues. Atherosclerosis, retroperitoneal edema, and left nephrolithiasis. Postoperative changes at the proximal stomach. Disc levels: T12- L1: Mild facet spurring.  No evidence of impingement L1-L2: Mild facet spurring.  No evidence of impingement L2-L3: Mild facet spurring. Disc narrowing asymmetric to the right with bulging. No evidence of impingement L3-L4: Advanced degenerative disc narrowing asymmetric towards the right. Asymmetric right facet spurring. Moderate right foraminal stenosis. Mild bilateral subarticular recess narrowing. L4-L5: Advanced asymmetric rightward degenerative disc narrowing. Asymmetric left degenerative facet hypertrophy. Ligamentum flavum thickening. There is moderate to advanced spinal stenosis. Left foraminal L4 impingement due to disc height loss and bulging. L5-S1:Mild disc narrowing and bulging.  No evidence of impingement. IMPRESSION: 1. No acute or unexpected finding. No evidence of postprocedural hemorrhage. 2. Scoliosis and advanced L3-4 and L4-5 disc degeneration with stenoses described above.  Electronically Signed   By: Monte Fantasia M.D.   On: 02/15/2017 16:15    Medications:  I have reviewed the patient's current medications. Scheduled: . amiodarone  150 mg Intravenous Once  . chlorhexidine gluconate (MEDLINE KIT)  15 mL Mouth Rinse BID  . feeding supplement (PRO-STAT SUGAR FREE 64)  30 mL Per Tube BID  . feeding supplement (VITAL 1.5 CAL)  1,000 mL Per Tube Q24H  . free water  175 mL  Per Tube Q6H  . mouth rinse  15 mL Mouth Rinse 10 times per day  . multivitamin  15 mL Per Tube Daily  . pantoprazole (PROTONIX) IV  40 mg Intravenous Q24H  . senna-docusate  1 tablet Per Tube BID    Assessment/Plan: Patient unchanged.  LP shows normal protein and glucose.  WBC of 165, RBC count 5894.  99% neutrophils.  Despite neutrophilic predominance HSV remains on the differential.  HSV pending.  Would continue broad spectrum antibiotics and anticonvulsant therapy.    Case discussed with Dr. Mortimer Fries   LOS: 3 days   Alexis Goodell, MD Neurology [phone] 02/16/2017  2:19 PM

## 2017-02-16 NOTE — Progress Notes (Signed)
[facility] [place] NAME: Melissa Cochran    MR#:  [id number]  DATE OF BIRTH:  1936/03/12  SUBJECTIVE:  CHIEF COMPLAINT:   Chief Complaint  Patient presents with  . Seizures    came with AMS, Seizures and fever. Intubated for airway protection.  MRI noted to have likely  Changes of Encephalitis  Seen by neurology also, as was on anticoagulant, advised to give broad spectrum ABx   LP done . Pt remains comatose.  REVIEW OF SYSTEMS:  Pt is intubated, can not give ROS.  ROS  DRUG ALLERGIES:  No Known Allergies  VITALS:  Blood pressure 131/69, pulse (!) 110, temperature 99.2 F (37.3 C), temperature source Oral, resp. rate 19, height 5\' 5"  (1.651 m), weight 70.4 kg (155 lb 3.3 oz), SpO2 98 %.  PHYSICAL EXAMINATION:  GENERAL:  81 y.o.-year-old patient lying in the bed with no acute distress.  EYES: Pupils equal, round, reactive to light . No scleral icterus.   HEENT: Head atraumatic, normocephalic. Oropharynx and nasopharynx clear.  NECK:  Supple, no jugular venous distention. No thyroid enlargement, no tenderness.  LUNGS: Normal breath sounds bilaterally, no wheezing, rales,rhonchi or crepitation. No use of accessory muscles of respiration. ETT , on vent support. CARDIOVASCULAR: S1, S2 normal. No murmurs, rubs, or gallops.  ABDOMEN: Soft, nontender, nondistended. Bowel sounds present. No organomegaly or mass.  EXTREMITIES: No pedal edema, cyanosis, or clubbing.  NEUROLOGIC: on vent.  on sedation. PSYCHIATRIC: The patient is comatose, sedated.  SKIN: No obvious rash, lesion, or ulcer.   Physical Exam LABORATORY PANEL:   CBC  Recent Labs Lab 02/14/17 0257  WBC 17.0*  HGB 12.8  HCT 39.3  PLT 307   ------------------------------------------------------------------------------------------------------------------  Chemistries   Recent Labs Lab 02/13/17 1729  02/16/17 0906  NA 141  < > 136  K 4.8  < > 3.8  CL 108  < >  98*  CO2 22  < > 28  GLUCOSE 91  < > 146*  BUN 18  < > 29*  CREATININE 1.03*  < > 0.73  CALCIUM 8.5*  < > 8.5*  MG  --   < > 2.2  AST 72*  --   --   ALT 13*  --   --   ALKPHOS 120  --   --   BILITOT 0.5  --   --   < > = values in this interval not displayed. ------------------------------------------------------------------------------------------------------------------  Cardiac Enzymes  Recent Labs Lab 02/13/17 2135 02/14/17 0257  TROPONINI 11.29* 15.17*   ------------------------------------------------------------------------------------------------------------------  RADIOLOGY:  Ct Lumbar Spine Wo Contrast  Result Date: 02/15/2017 CLINICAL DATA:  Meningitis due to other and unspecified causes. Rule out hemorrhage after lumbar puncture today. EXAM: CT LUMBAR SPINE WITHOUT CONTRAST TECHNIQUE: Multidetector CT imaging of the lumbar spine was performed without intravenous contrast administration. Multiplanar CT image reconstructions were also generated. COMPARISON:  Abdominal CT 07/08/2016 FINDINGS: Segmentation: 5 lumbar type vertebrae. Alignment: Moderate levoscoliosis.  Leftward translation at L3-4. Vertebrae: Negative for acute fracture, discitis, or aggressive bone lesion. Advanced endplate irregularity at L3-4 and L4-5 which is chronic and considered degenerative. Canal: Left paracentral dorsal epidural gas at the level of L2 correlating with recent LP history. No evidence of intraspinal hemorrhage, either within the thecal sac or epidural space. Paraspinal and other soft tissues: No evidence of hematoma in the posterior soft tissues. Atherosclerosis, retroperitoneal edema, and left nephrolithiasis. Postoperative changes at the proximal stomach. Disc levels: T12- L1: Mild  facet spurring.  No evidence of impingement L1-L2: Mild facet spurring.  No evidence of impingement L2-L3: Mild facet spurring. Disc narrowing asymmetric to the right with bulging. No evidence of impingement L3-L4:  Advanced degenerative disc narrowing asymmetric towards the right. Asymmetric right facet spurring. Moderate right foraminal stenosis. Mild bilateral subarticular recess narrowing. L4-L5: Advanced asymmetric rightward degenerative disc narrowing. Asymmetric left degenerative facet hypertrophy. Ligamentum flavum thickening. There is moderate to advanced spinal stenosis. Left foraminal L4 impingement due to disc height loss and bulging. L5-S1:Mild disc narrowing and bulging.  No evidence of impingement. IMPRESSION: 1. No acute or unexpected finding. No evidence of postprocedural hemorrhage. 2. Scoliosis and advanced L3-4 and L4-5 disc degeneration with stenoses described above. Electronically Signed   By: Monte Fantasia M.D.   On: 02/15/2017 16:15    ASSESSMENT AND PLAN:   Active Problems:   Convulsive status epilepticus ([place])  * Acute encephalopathy- Altered mental status   Likely meningitis Vs encephalitis   On broad spectrum Abx.   LP done today , follow results.   Pt remains comatose, no use of sedation meds.   CT lumber spine as had some blood at end of procedure ( Was on anticoagulant 2 days ago) HSV cx is pending from LP.  * Status epilepticus  continue sedation medication IV, Keppra IV and neurology consult.  * Acute respiratory failure with hypoxia Continue ventilation and intensivist consult.  * Sepsis due to UTI and meningitis  sepsis protocol, continue antibiotics, follow-up CBC and cultures.  ID consult to help.  * Elevated troponin. Possible due to demanding ischemia secondary to seizure, respiratory failure and sepsis.     Echo.  * Accelerated hypertension. IV hydralazine when necessary. * COPD. Stable. * Chronic diastolic CHF. Stable.   All the records are reviewed and case discussed with Care Management/Social Workerr. Management plans discussed with the patient, family and they are in agreement.  CODE STATUS: Full.  TOTAL TIME TAKING CARE OF THIS PATIENT: 35  minutes.   POSSIBLE D/C IN 2-3 DAYS, DEPENDING ON CLINICAL CONDITION.   Vaughan Basta M.D on 02/16/2017   Between 7am to 6pm - Pager - [phone]  After 6pm go to [email or web address] - password [email or web address] [facility]  Office  [phone]  CC: Primary care physician; Teodoro Spray, MD  Note: This dictation was prepared with Dragon dictation along with smaller phrase technology. Any transcriptional errors that result from this process are unintentional.

## 2017-02-17 ENCOUNTER — Inpatient Hospital Stay: Payer: Medicare Other

## 2017-02-17 DIAGNOSIS — L8990 Pressure ulcer of unspecified site, unspecified stage: Secondary | ICD-10-CM | POA: Insufficient documentation

## 2017-02-17 DIAGNOSIS — L8994 Pressure ulcer of unspecified site, stage 4: Secondary | ICD-10-CM

## 2017-02-17 DIAGNOSIS — G9341 Metabolic encephalopathy: Secondary | ICD-10-CM

## 2017-02-17 LAB — GLUCOSE, CAPILLARY
GLUCOSE-CAPILLARY: 129 mg/dL — AB (ref 65–99)
GLUCOSE-CAPILLARY: 106 mg/dL — AB (ref 65–99)
GLUCOSE-CAPILLARY: 101 mg/dL — AB (ref 65–99)
Glucose-Capillary: 122 mg/dL — ABNORMAL HIGH (ref 65–99)

## 2017-02-17 LAB — CULTURE, BLOOD (ROUTINE X 2): Special Requests: ADEQUATE

## 2017-02-17 LAB — VDRL, CSF: VDRL Quant, CSF: NONREACTIVE

## 2017-02-17 LAB — RAPID HIV SCREEN (HIV 1/2 AB+AG)
HIV 1/2 ANTIBODIES: NONREACTIVE
HIV-1 P24 ANTIGEN - HIV24: NONREACTIVE

## 2017-02-17 LAB — VANCOMYCIN, TROUGH: Vancomycin Tr: 13 ug/mL — ABNORMAL LOW (ref 15–20)

## 2017-02-17 MED ORDER — SODIUM CHLORIDE 0.9% FLUSH: 10.0000 mL | INTRAVENOUS | Status: DC | PRN

## 2017-02-17 MED ORDER — SODIUM CHLORIDE 0.9% FLUSH
10.0000 mL | Freq: Two times a day (BID) | INTRAVENOUS | Status: DC
  Administered 2017-02-17 – 2017-02-22 (×10): 10 mL

## 2017-02-17 MED ORDER — VANCOMYCIN HCL 10 G IV SOLR
1250.0000 mg | INTRAVENOUS | Status: DC
  Administered 2017-02-17: 1250 mg via INTRAVENOUS
  Filled 2017-02-17 (×4): qty 1250

## 2017-02-17 MED ORDER — DEXTROSE 5 % IV SOLN
650.0000 mg | Freq: Three times a day (TID) | INTRAVENOUS | Status: DC
  Administered 2017-02-17 – 2017-02-20 (×8): 650 mg via INTRAVENOUS
  Filled 2017-02-17 (×13): qty 13

## 2017-02-17 NOTE — Progress Notes (Signed)
Peripherally Inserted Central Catheter/Midline Placement  The IV Nurse has discussed with the patient and/or persons authorized to consent for the patient, the purpose of this procedure and the potential benefits and risks involved with this procedure.  The benefits include less needle sticks, lab draws from the catheter, and the patient may be discharged home with the catheter. Risks include, but not limited to, infection, bleeding, blood clot (thrombus formation), and puncture of an artery; nerve damage and irregular heartbeat and possibility to perform a PICC exchange if needed/ordered by physician.  Alternatives to this procedure were also discussed.  Bard Power PICC patient education guide, fact sheet on infection prevention and patient information card has been provided to patient /or left at bedside.  Daughter signed consent at bedside.    RUE noted to be significantly edematous prior to PICC insertion.  PICC/Midline Placement Documentation  PICC Triple Lumen 75/88/32 PICC Right Basilic 36 cm 0 cm (Active)  Indication for Insertion or Continuance of Line Vasoactive infusions 02/17/2017  1:24 PM  Exposed Catheter (cm) 0 cm 02/17/2017  1:24 PM  Site Assessment Clean;Dry;Intact 02/17/2017  1:24 PM  Lumen #1 Status Flushed;Saline locked;Blood return noted 02/17/2017  1:24 PM  Lumen #2 Status Flushed;Saline locked;Blood return noted 02/17/2017  1:24 PM  Lumen #3 Status Flushed;Saline locked;Blood return noted 02/17/2017  1:24 PM  Dressing Type Transparent 02/17/2017  1:24 PM  Dressing Status Clean;Dry;Intact;Antimicrobial disc in place 02/17/2017  1:24 PM  Line Care Connections checked and tightened 02/17/2017  1:24 PM  Line Adjustment (NICU/IV Team Only) No 02/17/2017  1:24 PM  Dressing Intervention New dressing 02/17/2017  1:24 PM  Dressing Change Due 02/24/17 02/17/2017  1:24 PM       Rolena Infante 02/17/2017, 1:25 PM

## 2017-02-17 NOTE — Progress Notes (Signed)
[facility] at [place] NAME: Melissa Cochran    MR#:  [id number]  DATE OF BIRTH:  May 28, 1935  SUBJECTIVE:  CHIEF COMPLAINT:   Chief Complaint  Patient presents with  . Seizures    came with AMS, Seizures and fever. Intubated for airway protection.  MRI noted to have likely  Changes of Encephalitis  Seen by neurology also, as was on anticoagulant, advised to give broad spectrum ABx   LP done . Pt remains comatose. Off sedation for last 3 days.  REVIEW OF SYSTEMS:  Pt is intubated, can not give ROS.  ROS  DRUG ALLERGIES:  No Known Allergies  VITALS:  Blood pressure (!) 145/74, pulse (!) 105, temperature 100.1 F (37.8 C), temperature source Oral, resp. rate (!) 21, height 5\' 5"  (1.651 m), weight 71.9 kg (158 lb 8.2 oz), SpO2 100 %.  PHYSICAL EXAMINATION:  GENERAL:  81 y.o.-year-old patient lying in the bed with no acute distress.  EYES: Pupils equal, round, reactive to light . No scleral icterus.   HEENT: Head atraumatic, normocephalic. Oropharynx and nasopharynx clear.  NECK:  Supple, no jugular venous distention. No thyroid enlargement, no tenderness.  LUNGS: Normal breath sounds bilaterally, no wheezing, rales,rhonchi or crepitation. No use of accessory muscles of respiration. ETT , on vent support. CARDIOVASCULAR: S1, S2 normal. No murmurs, rubs, or gallops.  ABDOMEN: Soft, nontender, nondistended. Bowel sounds present. No organomegaly or mass.  EXTREMITIES: No pedal edema, cyanosis, or clubbing.  NEUROLOGIC: on vent.  on sedation. PSYCHIATRIC: The patient is comatose, sedated.  SKIN: No obvious rash, lesion, or ulcer.   Physical Exam LABORATORY PANEL:   CBC  Recent Labs Lab 02/14/17 0257  WBC 17.0*  HGB 12.8  HCT 39.3  PLT 307   ------------------------------------------------------------------------------------------------------------------  Chemistries   Recent Labs Lab 02/13/17 1729  02/16/17 0906  NA 141   < > 136  K 4.8  < > 3.8  CL 108  < > 98*  CO2 22  < > 28  GLUCOSE 91  < > 146*  BUN 18  < > 29*  CREATININE 1.03*  < > 0.73  CALCIUM 8.5*  < > 8.5*  MG  --   < > 2.2  AST 72*  --   --   ALT 13*  --   --   ALKPHOS 120  --   --   BILITOT 0.5  --   --   < > = values in this interval not displayed. ------------------------------------------------------------------------------------------------------------------  Cardiac Enzymes  Recent Labs Lab 02/13/17 2135 02/14/17 0257  TROPONINI 11.29* 15.17*   ------------------------------------------------------------------------------------------------------------------  RADIOLOGY:  Dg Chest Port 1 View  Result Date: 02/17/2017 CLINICAL DATA:  PICC line placement. EXAM: PORTABLE CHEST 1 VIEW COMPARISON:  02/13/2017 FINDINGS: The endotracheal tube is 3 cm above the carina. The NG tube is coursing down into the stomach. In the right sided PICC line tip is in the mid SVC just above the level of the carina. No complicating features. The lungs remain clear. IMPRESSION: Right PICC line tip in the mid SVC. Endotracheal tube and NG tubes in good position. No significant pulmonary findings. Electronically Signed   By: Marijo Sanes M.D.   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   LP  done today , follow results. Cx negative - also HSV PCR negative.   Pt remains comatose, no use of sedation meds.   CT lumber spine as had some blood at end of procedure ( Was on anticoagulant 2 days ago) negative  HSV PCR negative from LP. ID suggest to get VZV PCR- added.   Advise to contiunue Vanc+ Meropenem and Acuclovir.  * Status epilepticus  continue sedation medication IV, Keppra IV and neurology consult.  * Acute respiratory failure with hypoxia Continue  ventilation and intensivist consult.  * Sepsis due to UTI and meningitis  sepsis protocol, continue antibiotics, follow-up CBC and cultures.  Have ESBL- on meropenem,  * Elevated troponin. Possible due to demanding ischemia secondary to seizure, respiratory failure and sepsis.     Echo.  * Accelerated hypertension. IV hydralazine when necessary. * COPD. Stable. * Chronic diastolic CHF. Stable.   All the records are reviewed and case discussed with Care Management/Social Workerr. Management plans discussed with the patient, family and they are in agreement.  CODE STATUS: Full.  TOTAL TIME TAKING CARE OF THIS PATIENT: 35 minutes.   palliative care consult as prognosis very poor.  POSSIBLE D/C IN 2-3 DAYS, DEPENDING ON CLINICAL CONDITION.   Vaughan Basta M.D on 02/17/2017   Between 7am to 6pm - Pager - [phone]  After 6pm go to [email or web address] - password [email or web address] [facility]  Office  [phone]  CC: Primary care physician; Teodoro Spray, MD  Note: This dictation was prepared with Dragon dictation along with smaller phrase technology. Any transcriptional errors that result from this process are unintentional.

## 2017-02-17 NOTE — Progress Notes (Signed)
PATIENT NAME: Melissa Cochran    MR#:  [id number]  DATE OF BIRTH:  [birth year]  DATE OF ADMISSION:  02/13/2017  PRIMARY CARE PHYSICIAN: Teodoro Spray, MD   REQUESTING/REFERRING PHYSICIAN: Merlyn Lot, MD  CHIEF COMPLAINT:   Chief Complaint  Patient presents with  . Seizures   Unresponsiveness and the seizure activity today. HISTORY OF PRESENT ILLNESS:   Patient with +CNS infection Multiple abx Poor prognosis Remains critically ill On full vent support    DRUG ALLERGIES:  No Known Allergies  REVIEW OF SYSTEMS:   Review of Systems  Unable to perform ROS: Intubated      VITAL SIGNS:  Blood pressure 101/62, pulse (!) 101, temperature 98.9 F (37.2 C), temperature source Oral, resp. rate 20, height 5\' 5"  (1.651 m), weight 158 lb 8.2 oz (71.9 kg), SpO2 99 %.  PHYSICAL EXAMINATION:  Physical Exam  GENERAL:  81 y.o.-year-old patient lying in the bed, on Ventilation.  EYES: Pupils equal, round, reactive to light. No scleral icterus. HEENT: Head atraumatic, normocephalic.  NECK:  Supple, no jugular venous distention.  LUNGS: Normal breath sounds bilaterally, no wheezing, rales,rhonchi or crepitation. CARDIOVASCULAR: S1, S2 normal. No murmurs, rubs, or gallops.  ABDOMEN: Soft, nontender, nondistended. Bowel sounds present. No organomegaly or mass.  EXTREMITIES: No pedal edema, cyanosis, or clubbing.  NEUROLOGIC: Unable to exam.  PSYCHIATRIC: The patient is intubated and sedated. SKIN: No obvious rash, lesion, or ulcer.   LABORATORY PANEL:   CBC  Recent Labs Lab 02/14/17 0257  WBC 17.0*  HGB 12.8  HCT 39.3  PLT 307   ------------------------------------------------------------------------------------------------------------------  Chemistries   Recent Labs Lab 02/13/17 1729  02/16/17 0906  NA 141  < > 136  K 4.8  < > 3.8  CL 108  < > 98*  CO2 22  < > 28  GLUCOSE 91  < > 146*  BUN 18  < > 29*  CREATININE 1.03*  < > 0.73  CALCIUM 8.5*  <  > 8.5*  MG  --   < > 2.2  AST 72*  --   --   ALT 13*  --   --   ALKPHOS 120  --   --   BILITOT 0.5  --   --   < > = values in this interval not displayed. ------------------------------------------------------------------------------------------------------------------  Cardiac Enzymes  Recent Labs Lab 02/14/17 0257  TROPONINI 15.17*     IMPRESSION AND PLAN:   81 yo white female with acute encephalopathy from acute CVA and encephalitis with severe resp failure with status epilepticus  Status epilepticus Keppra IV and neurology consult.  Severe CN S infection/CVA Follow up neuro recs Continue IV abx   Acute respiratory failure with hypoxia continue vent support Airway support  Sepsis due to UTI sepsis protocol, continue antibiotics, ESBL species Elevated troponin. Possible due to demanding ischemia secondary to seizure, respiratory failure and sepsis.  Follow-up troponin level and cardiology consult.  Accelerated hypertension. IV hydralazine when necessary. COPD. Stable. Chronic diastolic CHF. Stable.    Critical Care Time devoted to patient care services described in this note is 36 minutes.   Overall, patient is critically ill, prognosis is guarded.  Patient with Multiorgan failure and at high risk for cardiac arrest and death.  Overall prognosis is very poor due to severe encephalopathy will obtain palliative care consultation to address goals of care with family  Corrin Parker, M.D.  [facility]  Medical Director [place] Director [place]  Department

## 2017-02-17 NOTE — Progress Notes (Signed)
ID E Note Still febrile low grade. Remains critically ill.  HSV PCR neg. CSF cx negative to date.   Rec I would continue the vanco and meropenem and also the acyclovir (could be VZV).  I have asked lab to add on VZV PCR.

## 2017-02-17 NOTE — Progress Notes (Signed)
Nutrition Follow-up  DOCUMENTATION CODES:   Not applicable  INTERVENTION:  Continue Vital 1.5 at 40 ml/hr + Pro-Stat 30 ml BID. Provides 1640 kcal, 95 grams of protein, 739 ml H2O daily.  Continue free water flush of 175 ml Q6hrs  Continue liquid multivitamin daily per tube.  NUTRITION DIAGNOSIS:   Inadequate oral intake related to inability to eat as evidenced by NPO status.  Ongoing - addressing with TF.  GOAL:   Provide needs based on ASPEN/SCCM guidelines  Met with TF.  MONITOR:   Vent status, Labs, Weight trends, TF tolerance, I & O's, Skin  REASON FOR ASSESSMENT:   Ventilator    ASSESSMENT:   81 year old female with PMHx of HTN, HLD, heart block, adnexal mass, bleeding ulcer, CHF, CAD, COPD, GERD, anxiety who presented with unresponsiveness and status epilepticus and was intubated on 10/7 for airway protection.  -Patient found to have CNS infection.  No family members at bedside during assessment today. Patient remains intubated. Not on any sedation.  Access: 18 Fr. OGT placed 10/7; verified to terminate in gastric body per abdominal x-ray 10/7; 55 cm at corner of mouth  MAP: 78-118 mmHg  TF: patient tolerating Vital 1.5 at 40 ml/hr + Pro-Stat 30 ml BID with free water flush of 175 ml Q6hrs  Patient is currently intubated on ventilator support MV: 10.3 L/min Temp (24hrs), Avg:99.6 F (37.6 C), Min:98.6 F (37 C), Max:100.6 F (38.1 C)  Propofol: N/A  Medications reviewed and include: amiodarone, liquid MVI daily, pantoprazole, senna, Keppra, meropenem, vancomycin.  Labs reviewed: CBG 106-139. On 10/10 Potassium, Phosphorus, and Magnesium WNL.  I/O 0700 10/10 to 0700 10/11: 925 ml UOP (0.5 ml/kg/hr); 350 ml stool output (50 ml in rectal tube)  Weight trend :71.9 kg on 10/11; only +0.9 kg from first true measured weight on 10/9  Discussed with RN. Also discussed on rounds.  Diet Order:  Diet NPO time specified  Skin:  Wound (see comment) (DTI  to left ischial tuberosity)  Last BM:  02/17/2017 - large type 7; rectal tube  Height:   Ht Readings from Last 1 Encounters:  02/17/17 _0  (1.651 m)    Weight:   Wt Readings from Last 1 Encounters:  02/17/17 158 lb 8.2 oz (71.9 kg)    Ideal Body Weight:  56.8 kg  BMI:  Body mass index is 26.38 kg/m.  Estimated Nutritional Needs:   Kcal:  1682 (PSU 2003b w/ MSJ 1162, Ve 7.5, Tmax 39.2)  Protein:  82-95 grams (1.2-1.4 grams/kg)  Fluid:  1.7 L/day (25 ml/kg)  EDUCATION NEEDS:   No education needs identified at this time  Willey Blade, [place], [place], LDN Office: [phone] Pager: [phone] After Hours/Weekend Pager: [phone]

## 2017-02-17 NOTE — Progress Notes (Signed)
Chart reviewed. No family at bedside. VM left for daughter. Will f/u in AM to arrange [place] meeting.   NO CHARGE  Ihor Dow, FNP-C Palliative Medicine Team  Phone: [phone] Fax: [phone]

## 2017-02-17 NOTE — Progress Notes (Signed)
Pharmacy Antibiotic Note  Melissa Cochran is a 81 y.o. 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 Also treating for UTI due to ESBL e coli.  Pharmacy has been consulted for meropenem dosing.  Plan: Continue meropenem 2 g IV Q 8 H to cover for ESBL e coli and meningitis. Per ID, will continue acyclovir 650 mg iv q 8 hours.   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 Will increase vancomycin dosing to 1250 mg iv q 18 hours and continue adjust dosing/check levels as necessary to achieve vancomycin trough of 15-20 mcg/ml.    Height: 5\' 5"  (165.1 cm) Weight: 158 lb 8.2 oz (71.9 kg) IBW/kg (Calculated) : 57  Temp (24hrs), Avg:98.9 F (37.2 C), Min:98.4 F (36.9 C), Max:100 F (37.8 C)   Recent Labs Lab 02/13/17 1729 02/13/17 2135 02/14/17 0257 02/15/17 0506 02/16/17 0906  WBC 12.6*  --  17.0*  --   --   CREATININE 1.03*  --  0.89 0.70 0.73  LATICACIDVEN 5.8* 3.6*  --   --   --     Estimated Creatinine Clearance: 56.7 mL/min (by C-G formula based on SCr of 0.73 mg/dL).    No Known Allergies  Antimicrobials this admission: Zosyn 10/7 X 1 Ceftriaxone 10/8 >> 10/10 Acyclovir 10/8 >>  Ampicillin 10/8 >> 10/11 Vancomycin 10/8 >>  Meropenem 10/10 >>   Dose adjustments this admission:   Microbiology results: 10/7 BCx: coag neg staph (possible contaminant) 10/7 BCx: no growth 4 days 10/7 UCx: ESBL e coli  10/8 MRSA PCR: negative 10/9 CSF Cx: no growth 2 days 10/9 HSV PCR: negative   Thank you for allowing pharmacy to be a part of this patient's care.  Oralia Manis 02/17/2017 11:03 AM

## 2017-02-17 NOTE — Plan of Care (Signed)
Problem: Nutrition: Goal: Adequate nutrition will be maintained Outcome: Progressing Tolerating tube feedings at goal  Problem: Cardiac: Goal: Ability to achieve and maintain adequate cardiopulmonary perfusion will improve Outcome: Progressing HR goal less than 120

## 2017-02-18 LAB — GLUCOSE, CAPILLARY
GLUCOSE-CAPILLARY: 104 mg/dL — AB (ref 65–99)
GLUCOSE-CAPILLARY: 116 mg/dL — AB (ref 65–99)
GLUCOSE-CAPILLARY: 118 mg/dL — AB (ref 65–99)
GLUCOSE-CAPILLARY: 135 mg/dL — AB (ref 65–99)
GLUCOSE-CAPILLARY: 135 mg/dL — AB (ref 65–99)
GLUCOSE-CAPILLARY: 135 mg/dL — AB (ref 65–99)
GLUCOSE-CAPILLARY: 152 mg/dL — AB (ref 65–99)

## 2017-02-18 LAB — BASIC METABOLIC PANEL
Anion gap: 9 (ref 5–15)
BUN: 36 mg/dL — AB (ref 6–20)
CALCIUM: 8.4 mg/dL — AB (ref 8.9–10.3)
CO2: 28 mmol/L (ref 22–32)
CREATININE: 0.65 mg/dL (ref 0.44–1.00)
Chloride: 98 mmol/L — ABNORMAL LOW (ref 101–111)
GFR calc Af Amer: >60 mL/min (ref >60)
GLUCOSE: 131 mg/dL — AB (ref 65–99)
Potassium: 3.5 mmol/L (ref 3.5–5.1)
Sodium: 135 mmol/L (ref 135–145)

## 2017-02-18 LAB — CBC
HCT: 32.6 % — ABNORMAL LOW (ref 35.0–47.0)
Hemoglobin: 10.6 g/dL — ABNORMAL LOW (ref 12.0–16.0)
MCH: 29.2 pg (ref 26.0–34.0)
MCHC: 32.4 g/dL (ref 32.0–36.0)
MCV: 90.2 fL (ref 80.0–100.0)
Platelets: 224 10*3/uL (ref 150–440)
RBC: 3.62 MIL/uL — ABNORMAL LOW (ref 3.80–5.20)
RDW: 15.4 % — AB (ref 11.5–14.5)
WBC: 13.3 10*3/uL — ABNORMAL HIGH (ref 3.6–11.0)

## 2017-02-18 LAB — CULTURE, BLOOD (ROUTINE X 2)
CULTURE: NO GROWTH
SPECIAL REQUESTS: ADEQUATE

## 2017-02-18 MED ORDER — SODIUM CHLORIDE 0.9 % IV BOLUS (SEPSIS)
1000.0000 mL | Freq: Once | INTRAVENOUS | Status: AC
  Administered 2017-02-18: 1000 mL via INTRAVENOUS

## 2017-02-18 NOTE — Progress Notes (Signed)
Dr Carma Lair informed of low UOP this shift, in setting of CHF, bolus ordered, Foley irrigated before bolus started, Foley was free flowing w/ no blockages

## 2017-02-18 NOTE — Progress Notes (Signed)
PATIENT NAME: Melissa Cochran    MR#:  [id number]  DATE OF BIRTH:  1936-02-28  DATE OF ADMISSION:  02/13/2017  PRIMARY CARE PHYSICIAN: Teodoro Spray, MD   REQUESTING/REFERRING PHYSICIAN: Merlyn Lot, MD  CHIEF COMPLAINT:   Chief Complaint  Patient presents with  . Seizures   Unresponsiveness and the seizure activity today. HISTORY OF PRESENT ILLNESS:   Patient seen and examined at bedside. Noted events overnight.    DRUG ALLERGIES:  No Known Allergies  REVIEW OF SYSTEMS:   Review of Systems  Unable to perform ROS: Intubated      VITAL SIGNS:  Blood pressure 101/62, pulse (!) 101, temperature 98.9 F (37.2 C), temperature source Oral, resp. rate 20, height 5\' 5"  (1.651 m), weight 158 lb 8.2 oz (71.9 kg), SpO2 99 %.  PHYSICAL EXAMINATION:  Physical Exam  GENERAL:  nad HEENT: Head atraumatic, normocephalic.  NECK:  Supple, no jugular venous distention.  LUNGS: Normal breath sounds bilaterally, no wheezing, rales,rhonchi or crepitation. CARDIOVASCULAR: S1, S2 normal. No murmurs, rubs, or gallops.  ABDOMEN: Soft, nontender, nondistended. Bowel sounds present. No organomegaly or mass.  EXTREMITIES: No pedal edema, cyanosis, or clubbing.  NEUROLOGIC: Unable to exam.    LABORATORY PANEL:   CBC  Recent Labs Lab 02/14/17 0257  WBC 17.0*  HGB 12.8  HCT 39.3  PLT 307   ------------------------------------------------------------------------------------------------------------------  Chemistries   Recent Labs Lab 02/13/17 1729  02/16/17 0906  NA 141  < > 136  K 4.8  < > 3.8  CL 108  < > 98*  CO2 22  < > 28  GLUCOSE 91  < > 146*  BUN 18  < > 29*  CREATININE 1.03*  < > 0.73  CALCIUM 8.5*  < > 8.5*  MG  --   < > 2.2  AST 72*  --   --   ALT 13*  --   --   ALKPHOS 120  --   --   BILITOT 0.5  --   --   < > = values in this interval not  displayed. ------------------------------------------------------------------------------------------------------------------  Cardiac Enzymes  Recent Labs Lab 02/14/17 0257  TROPONINI 15.17*     IMPRESSION AND PLAN:  Toxic metabolic encephalopathy likely secondary to Status epilepticus Antibiotics/AED as per neurology. Monitor mental status closely.  Acute respiratory failure with hypoxia Weaning from ventilator as tolerated. Will attempt a breathing trial. Maintain saturations above 90%.  Sepsis due to ESBL UTI On meropenem.  DVT/GI prophylaxis.   Cc: 32 minutes  Dimas Chyle MD PCCM

## 2017-02-18 NOTE — Progress Notes (Signed)
Palliative meeting arranged today for 2pm. Daughter did not show at time requested. I checked bedside and family waiting room again around 4pm and family not present. PMT not at [facility] over the weekend but will contact daughter again on Monday to arrange [place] conversation.   NO CHARGE  Ihor Dow, FNP-C Palliative Medicine Team  Phone: [phone] Fax: [phone]

## 2017-02-18 NOTE — Progress Notes (Signed)
[place] [facility] [place] NAME: Melissa Cochran    MR#:  [id number]  DATE OF BIRTH:  09/23/1935  SUBJECTIVE:  CHIEF COMPLAINT:   Chief Complaint  Patient presents with  . Seizures    came with AMS, Seizures and fever. Intubated for airway protection.  MRI noted to have likely  Changes of Encephalitis  Seen by neurology also, as was on anticoagulant, advised to give broad spectrum ABx   LP done . Pt remains comatose. Off sedation for last 3 days. Not waking up.  REVIEW OF SYSTEMS:  Pt is intubated, can not give ROS.  ROS  DRUG ALLERGIES:  No Known Allergies  VITALS:  Blood pressure (!) 159/92, pulse 99, temperature 98.7 F (37.1 C), temperature source Axillary, resp. rate (!) 21, height 5\' 5"  (1.651 m), weight 78.4 kg (172 lb 13.5 oz), SpO2 100 %.  PHYSICAL EXAMINATION:  GENERAL:  81 y.o.-year-old patient lying in the bed with no acute distress.  EYES: Pupils equal, round, reactive to light . No scleral icterus.   HEENT: Head atraumatic, normocephalic. Oropharynx and nasopharynx clear.  NECK:  Supple, no jugular venous distention. No thyroid enlargement, no tenderness.  LUNGS: Normal breath sounds bilaterally, no wheezing, rales,rhonchi or crepitation. No use of accessory muscles of respiration. ETT , on vent support. CARDIOVASCULAR: S1, S2 normal. No murmurs, rubs, or gallops.  ABDOMEN: Soft, nontender, nondistended. Bowel sounds present. No organomegaly or mass.  EXTREMITIES: No pedal edema, cyanosis, or clubbing.  NEUROLOGIC: on vent.  Comatose. PSYCHIATRIC: The patient is comatose, sedated.  SKIN: No obvious rash, lesion, or ulcer.   Physical Exam LABORATORY PANEL:   CBC  Recent Labs Lab 02/18/17 1620  WBC 13.3*  HGB 10.6*  HCT 32.6*  PLT 224   ------------------------------------------------------------------------------------------------------------------  Chemistries   Recent Labs Lab 02/13/17 1729   02/16/17 0906 02/18/17 1620  NA 141  < > 136 135  K 4.8  < > 3.8 3.5  CL 108  < > 98* 98*  CO2 22  < > 28 28  GLUCOSE 91  < > 146* 131*  BUN 18  < > 29* 36*  CREATININE 1.03*  < > 0.73 0.65  CALCIUM 8.5*  < > 8.5* 8.4*  MG  --   < > 2.2  --   AST 72*  --   --   --   ALT 13*  --   --   --   ALKPHOS 120  --   --   --   BILITOT 0.5  --   --   --   < > = values in this interval not displayed. ------------------------------------------------------------------------------------------------------------------  Cardiac Enzymes  Recent Labs Lab 02/13/17 2135 02/14/17 0257  TROPONINI 11.29* 15.17*   ------------------------------------------------------------------------------------------------------------------  RADIOLOGY:  Dg Chest Port 1 View  Result Date: 02/17/2017 CLINICAL DATA:  PICC line placement. EXAM: PORTABLE CHEST 1 VIEW COMPARISON:  02/13/2017 FINDINGS: The endotracheal tube is 3 cm above the carina. The NG tube is coursing down into the stomach. In the right sided PICC line tip is in the mid SVC just above the level of the carina. No complicating features. The lungs remain clear. IMPRESSION: Right PICC line tip in the mid SVC. Endotracheal tube and NG tubes in good position. No significant pulmonary findings. Electronically Signed   By: Marijo Sanes M.D.   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   LP done today , follow results. Cx negative - also HSV PCR negative.   Pt remains comatose, no use of sedation meds.   CT lumber spine as had some blood at end of procedure ( Was on anticoagulant 2 days ago) negative  HSV PCR negative from LP. ID suggest to get VZV PCR- added.   Advise to contiunue Vanc+ Meropenem and Acyclovir.  * Status epilepticus  continue sedation  medication IV, Keppra IV and neurology consult.  * Acute respiratory failure with hypoxia Continue ventilation and intensivist consult.  * Sepsis due to UTI and meningitis  sepsis protocol, continue antibiotics, follow-up CBC and cultures.  Have ESBL- on meropenem.  * Elevated troponin. Possible due to demanding ischemia secondary to seizure, respiratory failure and sepsis.     Echo.  * Accelerated hypertension. IV hydralazine when necessary. * COPD. Stable. * Chronic diastolic CHF. Stable.   All the records are reviewed and case discussed with Care Management/Social Workerr. Management plans discussed with the patient, family and they are in agreement.  CODE STATUS: Full.  TOTAL TIME TAKING CARE OF THIS PATIENT: 35 minutes.   palliative care consult as prognosis very poor. Family did not show up today for meeting with palliative care.  POSSIBLE D/C IN 2-3 DAYS, DEPENDING ON CLINICAL CONDITION.   Vaughan Basta M.D on 02/18/2017   Between 7am to 6pm - Pager - [phone]  After 6pm go to [email or web address] - password [email or web address] [facility]  Office  [phone]  CC: Primary care physician; Teodoro Spray, MD  Note: This dictation was prepared with Dragon dictation along with smaller phrase technology. Any transcriptional errors that result from this process are unintentional.

## 2017-02-18 NOTE — Progress Notes (Signed)
[place] INFECTIOUS DISEASE PROGRESS NOTE Date of Admission:  02/13/2017     ID: Melissa Cochran is a 81 y.o. 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 Minimally responsive despite not being on sedation.   ROS  Eleven systems are reviewed and negative except per hpi  Medications:  Antibiotics Given (last 72 hours)    Date/Time Action Medication Dose Rate   02/15/17 1624 New Bag/Given   ampicillin (OMNIPEN) 2 g in sodium chloride 0.9 % 50 mL IVPB 2 g 150 mL/hr   02/15/17 1934 New Bag/Given   acyclovir (ZOVIRAX) 650 mg in dextrose 5 % 100 mL IVPB 650 mg 113 mL/hr   02/15/17 2059 New Bag/Given   ampicillin (OMNIPEN) 2 g in sodium chloride 0.9 % 50 mL IVPB 2 g 150 mL/hr   02/15/17 2200 New Bag/Given   cefTRIAXone (ROCEPHIN) 2 g in dextrose 5 % 50 mL IVPB 2 g 100 mL/hr   02/15/17 2348 New Bag/Given   ampicillin (OMNIPEN) 2 g in sodium chloride 0.9 % 50 mL IVPB 2 g 150 mL/hr   02/16/17 0424 New Bag/Given   acyclovir (ZOVIRAX) 650 mg in dextrose 5 % 100 mL IVPB 650 mg 113 mL/hr   02/16/17 0451 New Bag/Given   ampicillin (OMNIPEN) 2 g in sodium chloride 0.9 % 50 mL IVPB 2 g 150 mL/hr   02/16/17 0455 New Bag/Given   vancomycin (VANCOCIN) IVPB 1000 mg/200 mL premix 1,000 mg 200 mL/hr   02/16/17 0805 New Bag/Given   ampicillin (OMNIPEN) 2 g in sodium chloride 0.9 % 50 mL IVPB 2 g 150 mL/hr   02/16/17 0918 New Bag/Given   cefTRIAXone (ROCEPHIN) 2 g in dextrose 5 % 50 mL IVPB 2 g 100 mL/hr   02/16/17 1005 New Bag/Given   meropenem (MERREM) 1 g in sodium chloride 0.9 % 100 mL IVPB 1 g 200 mL/hr   02/16/17 1100 New Bag/Given   acyclovir (ZOVIRAX) 650 mg in dextrose 5 % 100 mL IVPB 650 mg 113 mL/hr   02/16/17 1146 New Bag/Given   ampicillin (OMNIPEN) 2 g in sodium chloride 0.9 % 50 mL IVPB 2 g 150 mL/hr   02/16/17 1354 New Bag/Given    meropenem (MERREM) 2 g in sodium chloride 0.9 % 100 mL IVPB 2 g 200 mL/hr   02/16/17 1614 New Bag/Given   ampicillin (OMNIPEN) 2 g in sodium chloride 0.9 % 50 mL IVPB 2 g 150 mL/hr   02/16/17 2014 New Bag/Given   acyclovir (ZOVIRAX) 650 mg in dextrose 5 % 100 mL IVPB 650 mg 113 mL/hr   02/16/17 2015 New Bag/Given   ampicillin (OMNIPEN) 2 g in sodium chloride 0.9 % 50 mL IVPB 2 g 150 mL/hr   02/16/17 2215 New Bag/Given   vancomycin (VANCOCIN) IVPB 1000 mg/200 mL premix 1,000 mg 200 mL/hr   02/16/17 2338 New Bag/Given   meropenem (MERREM) 2 g in sodium chloride 0.9 % 100 mL IVPB 2 g 200 mL/hr   02/17/17 0043 New Bag/Given   ampicillin (OMNIPEN) 2 g in sodium chloride 0.9 % 50 mL IVPB 2 g 150 mL/hr   02/17/17 0310 New Bag/Given   acyclovir (ZOVIRAX) 650 mg in dextrose 5 % 100 mL IVPB 650 mg 113 mL/hr   02/17/17 0343 New Bag/Given   ampicillin (OMNIPEN) 2 g in sodium chloride 0.9 % 50 mL IVPB 2 g 150 mL/hr  02/17/17 0548 New Bag/Given   meropenem (MERREM) 2 g in sodium chloride 0.9 % 100 mL IVPB 2 g 200 mL/hr   02/17/17 0725 New Bag/Given   ampicillin (OMNIPEN) 2 g in sodium chloride 0.9 % 50 mL IVPB 2 g 150 mL/hr   02/17/17 1400 New Bag/Given   meropenem (MERREM) 2 g in sodium chloride 0.9 % 100 mL IVPB 2 g 200 mL/hr   02/17/17 2229 New Bag/Given   meropenem (MERREM) 2 g in sodium chloride 0.9 % 100 mL IVPB 2 g 200 mL/hr   02/17/17 2241 New Bag/Given   acyclovir (ZOVIRAX) 650 mg in dextrose 5 % 100 mL IVPB 650 mg 113 mL/hr   02/17/17 2319 New Bag/Given  [awaiting pharmacy]   vancomycin (VANCOCIN) 1,250 mg in sodium chloride 0.9 % 250 mL IVPB 1,250 mg 166.7 mL/hr   02/18/17 0556 New Bag/Given   acyclovir (ZOVIRAX) 650 mg in dextrose 5 % 100 mL IVPB 650 mg 113 mL/hr   02/18/17 0741 New Bag/Given   meropenem (MERREM) 2 g in sodium chloride 0.9 % 100 mL IVPB 2 g 200 mL/hr   02/18/17 1418 New Bag/Given   meropenem (MERREM) 2 g in sodium chloride 0.9 % 100 mL IVPB 2 g 200 mL/hr   02/18/17  1458 New Bag/Given   acyclovir (ZOVIRAX) 650 mg in dextrose 5 % 100 mL IVPB 650 mg 113 mL/hr     . amiodarone  150 mg Intravenous Once  . chlorhexidine gluconate (MEDLINE KIT)  15 mL Mouth Rinse BID  . feeding supplement (PRO-STAT SUGAR FREE 64)  30 mL Per Tube BID  . feeding supplement (VITAL 1.5 CAL)  1,000 mL Per Tube Q24H  . free water  175 mL Per Tube Q6H  . mouth rinse  15 mL Mouth Rinse 10 times per day  . multivitamin  15 mL Per Tube Daily  . pantoprazole (PROTONIX) IV  40 mg Intravenous Q24H  . senna-docusate  1 tablet Per Tube BID  . sodium chloride flush  10-40 mL Intracatheter Q12H    Objective: Vital signs in last 24 hours: Temp:  [98.2 F (36.8 C)-100.1 F (37.8 C)] 98.9 F (37.2 C) (10/12 1200) Pulse Rate:  [97-132] 101 (10/12 1500) Resp:  [17-30] 18 (10/12 1500) BP: (107-156)/(47-83) 120/69 (10/12 1500) SpO2:  [99 %-100 %] 99 % (10/12 1500) FiO2 (%):  [28 %-100 %] 28 % (10/12 1200) Weight:  [78.4 kg (172 lb 13.5 oz)] 78.4 kg (172 lb 13.5 oz) (10/12 0356) Constitutional:  Intubated, unresponsive  HENT: , anicteric Mouth/Throat: ETT in place  Cardiovascular: Normal rate, regular rhythm and normal heart sounds. -Pulmonary/Chest: Effort normal and breath sounds normal. No respiratory distress.  has no wheezes.  Neck = supple, no nuchal rigidity Abdominal: Soft. Bowel sounds are normal.  exhibits no distension. There is no tenderness.  Lymphadenopathy: no cervical adenopathy. No axillary adenopathy Neurological: unresponsive Skin: Skin is warm and dry. No rash noted. No erythema.  Psychiatric unresponsive  Lab Results  Recent Labs  02/16/17 0906  NA 136  K 3.8  CL 98*  CO2 28  BUN 29*  CREATININE 0.73    Microbiology: Results for orders placed or performed during the hospital encounter of 02/13/17  Blood Culture (routine x 2)     Status: None   Collection Time: 02/13/17  5:29 PM  Result Value Ref Range Status   Specimen Description BLOOD LEFT HAND   Final   Special Requests   Final    BOTTLES DRAWN  AEROBIC AND ANAEROBIC Blood Culture adequate volume   Culture NO GROWTH 5 DAYS  Final   Report Status 02/18/2017 FINAL  Final  Blood Culture (routine x 2)     Status: Abnormal   Collection Time: 02/13/17  5:29 PM  Result Value Ref Range Status   Specimen Description BLOOD RIGHT HAND  Final   Special Requests   Final    BOTTLES DRAWN AEROBIC AND ANAEROBIC Blood Culture adequate volume   Culture  Setup Time   Final    GRAM POSITIVE COCCI AEROBIC BOTTLE ONLY CRITICAL RESULT CALLED TO, READ BACK BY AND VERIFIED WITH: GARRETT COFFEE AT 8527 02/15/17 SDR    Culture (A)  Final    STAPHYLOCOCCUS SPECIES (COAGULASE NEGATIVE) THE SIGNIFICANCE OF ISOLATING THIS ORGANISM FROM A SINGLE SET OF BLOOD CULTURES WHEN MULTIPLE SETS ARE DRAWN IS UNCERTAIN. PLEASE NOTIFY THE MICROBIOLOGY DEPARTMENT WITHIN ONE WEEK IF SPECIATION AND SENSITIVITIES ARE REQUIRED. Performed at [facility], [place] [address] [place], [place] [address]    Report Status 02/17/2017 FINAL  Final  Urine culture     Status: Abnormal   Collection Time: 02/13/17  5:29 PM  Result Value Ref Range Status   Specimen Description URINE, RANDOM  Final   Special Requests NONE  Final   Culture (A)  Final    >=100,000 COLONIES/mL ESCHERICHIA COLI Confirmed Extended Spectrum Beta-Lactamase Producer (ESBL).  In bloodstream infections from ESBL organisms, carbapenems are preferred over piperacillin/tazobactam. They are shown to have a lower risk of mortality. Performed at [facility], [place] [address], [place], [place] [address]    Report Status 02/16/2017 FINAL  Final   Organism ID, Bacteria ESCHERICHIA COLI (A)  Final      Susceptibility   Escherichia coli - MIC*    AMPICILLIN >=32 RESISTANT Resistant     CEFAZOLIN >=64 RESISTANT Resistant     CEFTRIAXONE >=64 RESISTANT Resistant     CIPROFLOXACIN >=4 RESISTANT Resistant     GENTAMICIN <=1 SENSITIVE Sensitive     IMIPENEM <=0.25  SENSITIVE Sensitive     NITROFURANTOIN <=16 SENSITIVE Sensitive     TRIMETH/SULFA >=320 RESISTANT Resistant     AMPICILLIN/SULBACTAM >=32 RESISTANT Resistant     PIP/TAZO <=4 SENSITIVE Sensitive     Extended ESBL POSITIVE Resistant     * >=100,000 COLONIES/mL ESCHERICHIA COLI  Blood Culture ID Panel (Reflexed)     Status: None   Collection Time: 02/13/17  5:29 PM  Result Value Ref Range Status   Enterococcus species NOT DETECTED NOT DETECTED Final   Listeria monocytogenes NOT DETECTED NOT DETECTED Final   Staphylococcus species NOT DETECTED NOT DETECTED Final   Staphylococcus aureus NOT DETECTED NOT DETECTED Final   Streptococcus species NOT DETECTED NOT DETECTED Final   Streptococcus agalactiae NOT DETECTED NOT DETECTED Final   Streptococcus pneumoniae NOT DETECTED NOT DETECTED Final   Streptococcus pyogenes NOT DETECTED NOT DETECTED Final   Acinetobacter baumannii NOT DETECTED NOT DETECTED Final   Enterobacteriaceae species NOT DETECTED NOT DETECTED Final   Enterobacter cloacae complex NOT DETECTED NOT DETECTED Final   Escherichia coli NOT DETECTED NOT DETECTED Final   Klebsiella oxytoca NOT DETECTED NOT DETECTED Final   Klebsiella pneumoniae NOT DETECTED NOT DETECTED Final   Proteus species NOT DETECTED NOT DETECTED Final   Serratia marcescens NOT DETECTED NOT DETECTED Final   Haemophilus influenzae NOT DETECTED NOT DETECTED Final   Neisseria meningitidis NOT DETECTED NOT DETECTED Final   Pseudomonas aeruginosa NOT DETECTED NOT DETECTED Final  Candida albicans NOT DETECTED NOT DETECTED Final   Candida glabrata NOT DETECTED NOT DETECTED Final   Candida krusei NOT DETECTED NOT DETECTED Final   Candida parapsilosis NOT DETECTED NOT DETECTED Final   Candida tropicalis NOT DETECTED NOT DETECTED Final  MRSA PCR Screening     Status: None   Collection Time: 02/14/17  1:23 AM  Result Value Ref Range Status   MRSA by PCR NEGATIVE NEGATIVE Final    Comment:        The GeneXpert MRSA  Assay (FDA approved for NASAL specimens only), is one component of a comprehensive MRSA colonization surveillance program. It is not intended to diagnose MRSA infection nor to guide or monitor treatment for MRSA infections.   CSF culture     Status: None (Preliminary result)   Collection Time: 02/15/17 12:49 PM  Result Value Ref Range Status   Specimen Description CSF  Final   Special Requests Normal  Final   Gram Stain   Final     MODERATE WBC SEEN NO ORGANISMS SEEN RARE RBC SEEN    Culture   Final    NO GROWTH 3 DAYS Performed at [facility], [place] [address] [place], [place] [address]    Report Status PENDING  Incomplete  Culture, fungus without smear     Status: None (Preliminary result)   Collection Time: 02/15/17 12:49 PM  Result Value Ref Range Status   Specimen Description CSF  Final   Special Requests NONE  Final   Culture   Final    NO FUNGUS ISOLATED AFTER 2 DAYS Performed at [facility], [place] [address] [place], [place] [address]    Report Status PENDING  Incomplete    Studies/Results: Dg Chest Port 1 View  Result Date: 02/17/2017 CLINICAL DATA:  PICC line placement. EXAM: PORTABLE CHEST 1 VIEW COMPARISON:  02/13/2017 FINDINGS: The endotracheal tube is 3 cm above the carina. The NG tube is coursing down into the stomach. In the right sided PICC line tip is in the mid SVC just above the level of the carina. No complicating features. The lungs remain clear. IMPRESSION: Right PICC line tip in the mid SVC. Endotracheal tube and NG tubes in good position. No significant pulmonary findings. Electronically Signed   By: Marijo Sanes M.D.   On: 02/17/2017 14:51   Assessment/Plan: Melissa Cochran is a 81 y.o. female admitted with seizures, AMS, fevers and leukocytosis. MRI concerning for encephalitis. UA concerning for UTI and UCX with ESBL E coli. BCX 1/2 GPC. LP shows elevated wbc and rbc.  HSV PCR negative.  Listeria also possible based on LP.    Recommendations Continue acyclovir pending VZV which I have sent on the CSF Meropenem added to cover the ESBL E coli in urine and will also cover Listeria and other CNS bacteria.  Thank you very much for the consult. Will follow with you.  Leonel Ramsay   02/18/2017, 3:39 PM

## 2017-02-18 NOTE — Progress Notes (Signed)
Pharmacy Antibiotic Note  Melissa Cochran is a 81 y.o. 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 Also treating for UTI due to ESBL e coli.  Pharmacy has been consulted for meropenem dosing.  Plan: Continue meropenem 2 g IV Q 8 H to cover for ESBL e coli and meningitis. Per ID, will continue acyclovir 650 mg IV Q 8 hours.  Continue vancomycin 1250 mg IV Q 18 hours and continue to adjust dosing/check levels as necessary to achieve vancomycin trough of 15-20 mcg/ml.    Height: 5\' 5"  (165.1 cm) Weight: 172 lb 13.5 oz (78.4 kg) IBW/kg (Calculated) : 57  Temp (24hrs), Avg:99.3 F (37.4 C), Min:98.2 F (36.8 C), Max:100.3 F (37.9 C)   Recent Labs Lab 02/13/17 1729 02/13/17 2135 02/14/17 0257 02/15/17 0506 02/16/17 0906 02/17/17 1630  WBC 12.6*  --  17.0*  --   --   --   CREATININE 1.03*  --  0.89 0.70 0.73  --   LATICACIDVEN 5.8* 3.6*  --   --   --   --   VANCOTROUGH  --   --   --   --   --  13*    Estimated Creatinine Clearance: 59.1 mL/min (by C-G formula based on SCr of 0.73 mg/dL).    No Known Allergies  Antimicrobials this admission: Zosyn 10/7 X 1 Ceftriaxone 10/8 >> 10/10 Ampicillin 10/8 >> 10/11 Acyclovir 10/8 >>  Vancomycin 10/8 >>  Meropenem 10/10 >>   Dose adjustments this admission:   Microbiology results: 10/7 BCx: coag neg staph (possible contaminant) 10/7 BCx: no growth 5 days 10/7 UCx: ESBL e coli  10/8 MRSA PCR: negative 10/9 CSF Cx: no growth 3 days 10/9 CSF Fungal Cx: no fungus isolated after 1 day 10/9 HSV PCR: negative 10/9 VZV PCR: pending   Thank you for allowing pharmacy to be a part of this patient's care.  Oralia Manis 02/18/2017 11:04 AM

## 2017-02-18 NOTE — Progress Notes (Signed)
Writer attempted to order a c-diff check for patient this shift d/t liquid stool, [facility] recommends no check at this time d/t pt having laxative in past 24 hours.  Will report off on this to oncoming shift.  Senna given last nite, held today.  Will recommend holding tonight and check her tomorrow for CDiff if liquid stool continues

## 2017-02-18 NOTE — Progress Notes (Addendum)
Pt follows commands when stimulated, but does not open her eyes or make any spontaneous movements when she is not stimulated, she withdraws from oral care, but is otherwise quiet, minimal coughing, no sedation gtts infusing, no PRN pushes given. UOP 300 this shift, bolus given, creatinine is WDL. Droplet DCd per Dr Ola Spurr, ABX course completed and cultures negative. Hydralazine and metoprolol pushed x 1 this AM for elevated HR and BP.  Pt failed SBT w/i 5 minutes this AM, WOB greatly increased and patient was tachypneic and tachycardic.  Rate restarted, VSS with full vent support

## 2017-02-19 LAB — CSF CULTURE W GRAM STAIN: Culture: NO GROWTH

## 2017-02-19 LAB — BLOOD GAS, ARTERIAL
Acid-Base Excess: 5.8 mmol/L — ABNORMAL HIGH (ref 0.0–2.0)
BICARBONATE: 28.2 mmol/L — AB (ref 20.0–28.0)
FIO2: 0.28
MECHVT: 500 mL
Mechanical Rate: 14
O2 SAT: 98.9 %
PCO2 ART: 33 mmHg (ref 32.0–48.0)
PEEP/CPAP: 5 cmH2O
PH ART: 7.54 — AB (ref 7.350–7.450)
Patient temperature: 37
pO2, Arterial: 114 mmHg — ABNORMAL HIGH (ref 83.0–108.0)

## 2017-02-19 LAB — GLUCOSE, CAPILLARY
GLUCOSE-CAPILLARY: 141 mg/dL — AB (ref 65–99)
GLUCOSE-CAPILLARY: 129 mg/dL — AB (ref 65–99)
GLUCOSE-CAPILLARY: 170 mg/dL — AB (ref 65–99)
Glucose-Capillary: 108 mg/dL — ABNORMAL HIGH (ref 65–99)
Glucose-Capillary: 139 mg/dL — ABNORMAL HIGH (ref 65–99)
Glucose-Capillary: 159 mg/dL — ABNORMAL HIGH (ref 65–99)

## 2017-02-19 LAB — CRYPTOCOCCUS ANTIGEN, SERUM: CRYPTOCOCCUS ANTIGEN, SERUM: NEGATIVE

## 2017-02-19 LAB — MAGNESIUM: MAGNESIUM: 2.4 mg/dL (ref 1.7–2.4)

## 2017-02-19 LAB — PHOSPHORUS: Phosphorus: 2.6 mg/dL (ref 2.5–4.6)

## 2017-02-19 LAB — BASIC METABOLIC PANEL
Anion gap: 8 (ref 5–15)
BUN: 38 mg/dL — AB (ref 6–20)
CHLORIDE: 100 mmol/L — AB (ref 101–111)
CO2: 29 mmol/L (ref 22–32)
Calcium: 8.8 mg/dL — ABNORMAL LOW (ref 8.9–10.3)
Creatinine, Ser: 0.74 mg/dL (ref 0.44–1.00)
GLUCOSE: 157 mg/dL — AB (ref 65–99)
POTASSIUM: 3.6 mmol/L (ref 3.5–5.1)
SODIUM: 137 mmol/L (ref 135–145)

## 2017-02-19 LAB — CSF CULTURE: SPECIAL REQUESTS: NORMAL

## 2017-02-19 LAB — OLIGOCLONAL BANDS, CSF + SERM

## 2017-02-19 MED ORDER — FENTANYL CITRATE (PF) 100 MCG/2ML IJ SOLN
25.0000 ug | INTRAMUSCULAR | Status: DC | PRN
  Administered 2017-02-19 – 2017-02-20 (×4): 50 ug via INTRAVENOUS
  Filled 2017-02-19 (×3): qty 2

## 2017-02-19 MED ORDER — SODIUM CHLORIDE 0.9 % IV BOLUS (SEPSIS)
1000.0000 mL | Freq: Once | INTRAVENOUS | Status: AC
  Administered 2017-02-19: 1000 mL via INTRAVENOUS

## 2017-02-19 NOTE — Progress Notes (Signed)
[facility] [place] NAME: Melissa Cochran    MR#:  [id number]  DATE OF BIRTH:  [birth year]  SUBJECTIVE:  CHIEF COMPLAINT:   Chief Complaint  Patient presents with  . Seizures   Admitted for altered mental status, seizures, meningitis. Had to be intubated. LP done after stopping anticoagulation.  Patient continues to be intubated on full ventilatory support. REVIEW OF SYSTEMS:  Pt is intubated, can not give ROS.  ROS  DRUG ALLERGIES:  No Known Allergies  VITALS:  Blood pressure (!) 143/73, pulse (!) 124, temperature 98.1 F (36.7 C), resp. rate (!) 24, height 5\' 5"  (1.651 m), weight 80.4 kg (177 lb 4 oz), SpO2 98 %.  PHYSICAL EXAMINATION:  GENERAL:  81 y.o.-year-old patient lying in the bed with no acute distress.  EYES: Pupils equal, round, reactive to light . No scleral icterus.   HEENT: Head atraumatic, normocephalic. Oropharynx and nasopharynx clear. ET tube NECK:  Supple, no jugular venous distention. No thyroid enlargement, no tenderness.  LUNGS: Normal breath sounds bilaterally, no wheezing, rales,rhonchi or crepitation. No use of accessory muscles of respiration. ETT , on vent support. CARDIOVASCULAR: S1, S2 normal. No murmurs, rubs, or gallops.  ABDOMEN: Soft, nontender, nondistended. Bowel sounds present. No organomegaly or mass.  EXTREMITIES: No pedal edema, cyanosis, or clubbing.  NEUROLOGIC: on vent.  Marland Kitchen PSYCHIATRIC: The patient is sedated.  SKIN: No obvious rash, lesion, or ulcer.   Physical Exam LABORATORY PANEL:   CBC  Recent Labs Lab 02/18/17 1620  WBC 13.3*  HGB 10.6*  HCT 32.6*  PLT 224   ------------------------------------------------------------------------------------------------------------------  Chemistries   Recent Labs Lab 02/13/17 1729  02/19/17 0444  NA 141  < > 137  K 4.8  < > 3.6  CL 108  < > 100*  CO2 22  < > 29  GLUCOSE 91  < > 157*  BUN 18  < > 38*  CREATININE 1.03*  < >  0.74  CALCIUM 8.5*  < > 8.8*  MG  --   < > 2.4  AST 72*  --   --   ALT 13*  --   --   ALKPHOS 120  --   --   BILITOT 0.5  --   --   < > = values in this interval not displayed. ------------------------------------------------------------------------------------------------------------------  Cardiac Enzymes  Recent Labs Lab 02/13/17 2135 02/14/17 0257  TROPONINI 11.29* 15.17*   ------------------------------------------------------------------------------------------------------------------  RADIOLOGY:  Dg Chest Port 1 View  Result Date: 02/17/2017 CLINICAL DATA:  PICC line placement. EXAM: PORTABLE CHEST 1 VIEW COMPARISON:  02/13/2017 FINDINGS: The endotracheal tube is 3 cm above the carina. The NG tube is coursing down into the stomach. In the right sided PICC line tip is in the mid SVC just above the level of the carina. No complicating features. The lungs remain clear. IMPRESSION: Right PICC line tip in the mid SVC. Endotracheal tube and NG tubes in good position. No significant pulmonary findings. Electronically Signed   By: Marijo Sanes M.D.   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 VZV pending.   CT lumber spine as had some blood at end of procedure ( Was on anticoagulant 2 days ago) negative contiunue Meropenem and Acyclovir.  *  Status epilepticus  continue sedation medication IV, Keppra IV and neurology consult.  * Acute respiratory failure with hypoxia Continue ventilation and intensivist consult.  * ESBL E coli UTI ON meropenem  * Elevated troponin. Possible due to demanding ischemia secondary to seizure, respiratory failure and sepsis.     Echo pending  * Accelerated hypertension. IV hydralazine when necessary.  * COPD. Stable.  *  Chronic diastolic CHF. Stable.  Discussed with Dr. Lyndel Safe in the ICU  CODE STATUS: Full.  TOTAL TIME TAKING CARE OF THIS PATIENT: 35 minutes.   Hillary Bow R M.D on 02/19/2017   Between 7am to 6pm - Pager - [phone]  After 6pm go to [email or web address] - password [email or web address] [facility]  Office  [phone]  CC: Primary care physician; Teodoro Spray, MD  Note: This dictation was prepared with Dragon dictation along with smaller phrase technology. Any transcriptional errors that result from this process are unintentional.

## 2017-02-19 NOTE — Progress Notes (Signed)
Spoke with Dr. Lyndel Safe regarding pt's UOP down again (same as yesterday).  1L bolus NS ordered and infused slowly.  BA

## 2017-02-20 ENCOUNTER — Inpatient Hospital Stay: Payer: Medicare Other

## 2017-02-20 LAB — GLUCOSE, CAPILLARY
GLUCOSE-CAPILLARY: 157 mg/dL — AB (ref 65–99)
GLUCOSE-CAPILLARY: 148 mg/dL — AB (ref 65–99)
GLUCOSE-CAPILLARY: 109 mg/dL — AB (ref 65–99)
Glucose-Capillary: 143 mg/dL — ABNORMAL HIGH (ref 65–99)
Glucose-Capillary: 149 mg/dL — ABNORMAL HIGH (ref 65–99)
Glucose-Capillary: 186 mg/dL — ABNORMAL HIGH (ref 65–99)

## 2017-02-20 LAB — BASIC METABOLIC PANEL
ANION GAP: 13 (ref 5–15)
BUN: 75 mg/dL — ABNORMAL HIGH (ref 6–20)
CALCIUM: 8.9 mg/dL (ref 8.9–10.3)
CO2: 24 mmol/L (ref 22–32)
Chloride: 98 mmol/L — ABNORMAL LOW (ref 101–111)
Creatinine, Ser: 1.98 mg/dL — ABNORMAL HIGH (ref 0.44–1.00)
GFR calc non Af Amer: 23 mL/min — ABNORMAL LOW (ref >60)
GFR, EST AFRICAN AMERICAN: 26 mL/min — AB (ref >60)
Glucose, Bld: 160 mg/dL — ABNORMAL HIGH (ref 65–99)
POTASSIUM: 3.8 mmol/L (ref 3.5–5.1)
Sodium: 135 mmol/L (ref 135–145)

## 2017-02-20 LAB — C DIFFICILE QUICK SCREEN W PCR REFLEX
C Diff antigen: NEGATIVE
C Diff interpretation: NOT DETECTED
C Diff toxin: NEGATIVE

## 2017-02-20 LAB — VANCOMYCIN, RANDOM: Vancomycin Rm: 9

## 2017-02-20 MED ORDER — SODIUM CHLORIDE 0.9 % IV BOLUS (SEPSIS)
1000.0000 mL | Freq: Once | INTRAVENOUS | Status: AC
  Administered 2017-02-20: 1000 mL via INTRAVENOUS

## 2017-02-20 MED ORDER — SODIUM CHLORIDE 0.9 % IV SOLN
1.0000 g | Freq: Two times a day (BID) | INTRAVENOUS | Status: DC
  Administered 2017-02-20 – 2017-02-22 (×4): 1 g via INTRAVENOUS
  Filled 2017-02-20 (×6): qty 1

## 2017-02-20 MED ORDER — SODIUM CHLORIDE 0.9 % IV SOLN
INTRAVENOUS | Status: DC
  Administered 2017-02-20 – 2017-02-21 (×2): via INTRAVENOUS

## 2017-02-20 MED ORDER — DEXTROSE 5 % IV SOLN
650.0000 mg | INTRAVENOUS | Status: DC
  Administered 2017-02-21 – 2017-02-22 (×2): 650 mg via INTRAVENOUS
  Filled 2017-02-20 (×3): qty 13

## 2017-02-20 MED ORDER — SODIUM CHLORIDE 0.9 % IV BOLUS (SEPSIS)
500.0000 mL | Freq: Once | INTRAVENOUS | Status: AC
  Administered 2017-02-20: 500 mL via INTRAVENOUS

## 2017-02-20 NOTE — Consult Note (Signed)
[facility] CONSULT NOTE    Date: 02/20/2017                  Patient Name:  Melissa Cochran  MRN: [id number]  DOB: [birth year]  Age / Sex: 81 y.o., female         PCP: Teodoro Spray, MD                 Service Requesting Consult: Pulmonary/critical care                 Reason for Consult: Acute renal failure             History of Present Illness: Patient is a 81 y.o. female with a PMHx of adnexal mass, history of bladder tumor, congestive heart failure, COPD, coronary artery disease, GERD, hyperidemia, hypertension, who was admitted to [facility] on 02/13/2017 for evaluation of sudden altered mental status.  Patient unable to provide any history however the patient's daughter is at bedside and provides most of the history. Last Sunday the the patient's daughter states that patient reported she wasn't feeling well. She went to go lay down for a short period time. When she woke up she was confused and unable to speak. It appears that she began having seizures thereafter as well. She is being treated for presumptive encephalitis versus meningitis. She has now developed acute renal failure. 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 The patient has also received vancomycin for sepsis. Her most recent vancomycin level was normal. She has not been significantly hypotensive this admission.   Medications: Outpatient medications: Facility-Administered Medications Prior to Admission  Medication Dose Route Frequency Provider Last Rate Last Dose  . apixaban (ELIQUIS) tablet 5 mg  5 mg Oral BID Algernon Huxley, MD      . traMADol Veatrice Bourbon) tablet 50 mg  50 mg Oral Q6H PRN Algernon Huxley, MD       Prescriptions Prior to Admission  Medication Sig Dispense Refill Last Dose  . clopidogrel (PLAVIX) 75 MG tablet TAKE 1 TABLET BY MOUTH ONCE DAILY 30 tablet 0 unknown at unknown  . metoprolol (LOPRESSOR) 100 MG tablet Take 1  tablet (100 mg total) by mouth 2 (two) times daily. (Patient taking differently: Take 100 mg by mouth daily. ) 60 tablet 0 unknown at unknown  . apixaban (ELIQUIS) 5 MG TABS tablet Take 5 mg by mouth 2 (two) times daily.   unknown at unknown  . docusate sodium (COLACE) 100 MG capsule Take 1 capsule (100 mg total) by mouth 2 (two) times daily. 60 capsule 0 unknown at unknown  . traMADol (ULTRAM) 50 MG tablet Take 100 mg by mouth every 6 (six) hours as needed.   unknown at unknown    Current medications: Current Facility-Administered Medications  Medication Dose Route Frequency Provider Last Rate Last Dose  . acetaminophen (TYLENOL) tablet 650 mg  650 mg Oral Q6H PRN Demetrios Loll, MD   650 mg at 02/19/17 1203   Or  . acetaminophen (TYLENOL) suppository 650 mg  650 mg Rectal Q6H PRN Demetrios Loll, MD   650 mg at 02/15/17 0009  . [START ON 02/21/2017] acyclovir (ZOVIRAX) 650 mg in dextrose 5 % 100 mL IVPB  650 mg Intravenous Q24H Sudini, Srikar, MD      . albuterol (PROVENTIL) (2.5 MG/3ML) 0.083% nebulizer solution 2.5 mg  2.5 mg Nebulization Q2H PRN Demetrios Loll, MD      .  amiodarone (NEXTERONE) 1.8 mg/mL load via infusion 150 mg  150 mg Intravenous Once Awilda Bill, NP       Followed by  . amiodarone (NEXTERONE PREMIX) 360-4.14 MG/200ML-% (1.8 mg/mL) IV infusion  30 mg/hr Intravenous Continuous Awilda Bill, NP 16.7 mL/hr at 02/20/17 1344 30 mg/hr at 02/20/17 1344  . chlorhexidine gluconate (MEDLINE KIT) (PERIDEX) 0.12 % solution 15 mL  15 mL Mouth Rinse BID Dorene Sorrow S, NP   15 mL at 02/20/17 0734  . feeding supplement (PRO-STAT SUGAR FREE 64) liquid 30 mL  30 mL Per Tube BID Flora Lipps, MD   30 mL at 02/20/17 0912  . feeding supplement (VITAL 1.5 CAL) liquid 1,000 mL  1,000 mL Per Tube Q24H Flora Lipps, MD 40 mL/hr at 02/20/17 1200 1,000 mL at 02/20/17 1200  . fentaNYL (SUBLIMAZE) injection 25-50 mcg  25-50 mcg Intravenous Q2H PRN Dorene Sorrow S, NP   50 mcg at 02/20/17 1251  .  free water 175 mL  175 mL Per Tube Q6H Flora Lipps, MD   175 mL at 02/20/17 1200  . hydrALAZINE (APRESOLINE) injection 10 mg  10 mg Intravenous Q6H PRN Demetrios Loll, MD   10 mg at 02/19/17 1734  . levETIRAcetam (KEPPRA) 1,000 mg in sodium chloride 0.9 % 100 mL IVPB  1,000 mg Intravenous Shelly Coss, MD 440 mL/hr at 02/20/17 1345 1,000 mg at 02/20/17 1345  . MEDLINE mouth rinse  15 mL Mouth Rinse 10 times per day Mikael Spray, NP   15 mL at 02/20/17 1327  . meropenem (MERREM) 1 g in sodium chloride 0.9 % 100 mL IVPB  1 g Intravenous Q12H Sudini, Srikar, MD      . metoprolol tartrate (LOPRESSOR) injection 2.5-5 mg  2.5-5 mg Intravenous Q6H PRN Awilda Bill, NP   2.5 mg at 02/20/17 0730  . midazolam (VERSED) 50 mg in sodium chloride 0.9 % 50 mL (1 mg/mL) infusion  0.5 mg/hr Intravenous Continuous Merlyn Lot, MD   Stopped at 02/14/17 0800  . multivitamin liquid 15 mL  15 mL Per Tube Daily Flora Lipps, MD   15 mL at 02/20/17 0913  . ondansetron (ZOFRAN) tablet 4 mg  4 mg Oral Q6H PRN Demetrios Loll, MD       Or  . ondansetron [facility]) injection 4 mg  4 mg Intravenous Q6H PRN Demetrios Loll, MD      . pantoprazole (PROTONIX) injection 40 mg  40 mg Intravenous Q24H Anders Simmonds, MD   40 mg at 02/19/17 2355  . senna-docusate (Senokot-S) tablet 1 tablet  1 tablet Per Tube BID Vaughan Basta, MD   1 tablet at 02/19/17 2138  . sodium chloride flush (NS) 0.9 % injection 10-40 mL  10-40 mL Intracatheter Q12H Flora Lipps, MD   10 mL at 02/20/17 0913  . sodium chloride flush (NS) 0.9 % injection 10-40 mL  10-40 mL Intracatheter PRN Flora Lipps, MD          Allergies: No Known Allergies    Past Medical History: Past Medical History:  Diagnosis Date  . Adnexal mass   . Anxiety   . Arthritis   . Bladder tumor   . Bleeding ulcer   . CHF (congestive heart failure) ([place])   . CHF (congestive heart failure) ([place])   . COPD (chronic obstructive pulmonary disease) ([place])   .  Coronary artery disease   . GERD (gastroesophageal reflux disease)   . Heart block    left  .  History of bleeding ulcers   . Hyperlipemia   . Hypertension   . Pneumonia   . Shortness of breath dyspnea      Past Surgical History: Past Surgical History:  Procedure Laterality Date  . ABDOMINAL HYSTERECTOMY    . CARDIAC CATHETERIZATION Left 10/21/2015   Procedure: Left Heart Cath and Coronary Angiography;  Surgeon: Isaias Cowman, MD;  Location: [place] [facility];  Service: Cardiovascular;  Laterality: Left;  . CARDIAC CATHETERIZATION N/A 10/21/2015   Procedure: Coronary Stent Intervention;  Surgeon: Isaias Cowman, MD;  Location: [place] [facility];  Service: Cardiovascular;  Laterality: N/A;  . CARDIAC SURGERY     cardiac cath  . CYSTOSCOPY W/ RETROGRADES Bilateral 01/20/2016   Procedure: CYSTOSCOPY WITH RETROGRADE PYELOGRAM;  Surgeon: Hollice Espy, MD;  Location: [facility];  Service: Urology;  Laterality: Bilateral;  . CYSTOSCOPY WITH STENT PLACEMENT Right 01/20/2016   Procedure: CYSTOSCOPY WITH STENT PLACEMENT;  Surgeon: Hollice Espy, MD;  Location: [facility] OR[facility];  Service: Urology;  Laterality: Right;  . HIP SURGERY     left  . STOMACH SURGERY    . TRANSURETHRAL RESECTION OF BLADDER TUMOR N/A 01/20/2016   Procedure: TRANSURETHRAL RESECTION OF BLADDER TUMOR (TURBT);  Surgeon: Hollice Espy, MD;  Location: [facility] OR[facility];  Service: Urology;  Laterality: N/A;     Family History: Family History  Problem Relation Age of Onset  . Stomach cancer Mother   . CVA Father   . Bladder Cancer Neg Hx   . Kidney cancer Neg Hx      Social History: Social History   Social History  . Marital status: Widowed    Spouse name: N/A  . Number of children: N/A  . Years of education: N/A   Occupational History  . Not on file.   Social History Main Topics  . Smoking status: Former Smoker    Packs/day: 1.00    Years: 40.00    Quit date: 06/13/1998  . Smokeless tobacco: Never Used   . Alcohol use No  . Drug use: No  . Sexual activity: Not Currently   Other Topics Concern  . Not on file   Social History Narrative  . No narrative on file     Review of Systems: Patient unable to offer review systems as she is intubated.  Vital Signs: Blood pressure 131/70, pulse (!) 122, temperature 98.1 F (36.7 C), resp. rate (!) 27, height _0  (1.651 m), weight 83.4 kg (183 lb 13.8 oz), SpO2 100 %.  Weight trends: Filed Weights   02/18/17 0356 02/19/17 0700 02/20/17 0500  Weight: 78.4 kg (172 lb 13.5 oz) 80.4 kg (177 lb 4 oz) 83.4 kg (183 lb 13.8 oz)    Physical Exam: General: Critically ill appearing  Head: Normocephalic, atraumatic.  Eyes: Anicteric, EOMI  Nose: Mucous membranes moist, not inflammed, nonerythematous.  Throat: Endotracheal tube noted to be in place  Neck: Supple, trachea midline.  Lungs:  Normal respiratory effort. Clear to auscultation BL without crackles or wheezes.  Heart: RRR. S1 and S2 normal without gallop, murmur, or rubs.  Abdomen:  BS normoactive. Soft, Nondistended, non-tender.  No masses or organomegaly.  Extremities: No pretibial edema.  Neurologic: Intubated, on the ventilator.  Skin: No visible rashes, scars.    Lab results: Basic Metabolic Panel:  Recent Labs Lab 02/14/17 1330 02/15/17 0506 02/15/17 1735 02/16/17 0906 02/18/17 1620 02/19/17 0444 02/20/17 1039  NA  --  138  --  136 135 137 135  K  --  3.4*  --  3.8 3.5 3.6 3.8  CL  --  104  --  98* 98* 100* 98*  CO2  --  27  --  _0 GLUCOSE  --  174*  --  146* 131* 157* 160*  BUN  --  21*  --  29* 36* 38* 75*  CREATININE  --  0.70  --  0.73 0.65 0.74 1.98*  CALCIUM  --  8.7*  --  8.5* 8.4* 8.8* 8.9  MG 1.9 2.2 2.2 2.2  --  2.4  --   PHOS 3.5 1.8* 2.1* 3.2  --  2.6  --     Liver Function Tests:  Recent Labs Lab 02/13/17 1729  AST 72*  ALT 13*  ALKPHOS 120  BILITOT 0.5  PROT 5.8*  ALBUMIN 2.7*   No results for input(s): LIPASE, AMYLASE in the  last 168 hours. No results for input(s): AMMONIA in the last 168 hours.  CBC:  Recent Labs Lab 02/13/17 1729 02/14/17 0257 02/18/17 1620  WBC 12.6* 17.0* 13.3*  NEUTROABS 11.8*  --   --   HGB 11.2* 12.8 10.6*  HCT 34.5* 39.3 32.6*  MCV 91.3 89.3 90.2  PLT 363 307 224    Cardiac Enzymes:  Recent Labs Lab 02/13/17 1729 02/13/17 2135 02/14/17 0257  TROPONINI 1.95* 11.29* 15.17*    BNP: Invalid input(s): POCBNP  CBG:  Recent Labs Lab 02/19/17 1957 02/19/17 2357 02/20/17 0424 02/20/17 0823 02/20/17 1222  GLUCAP 159* 186* 157* 149* 143*    Microbiology: Results for orders placed or performed during the hospital encounter of 02/13/17  Blood Culture (routine x 2)     Status: None   Collection Time: 02/13/17  5:29 PM  Result Value Ref Range Status   Specimen Description BLOOD LEFT HAND  Final   Special Requests   Final    BOTTLES DRAWN AEROBIC AND ANAEROBIC Blood Culture adequate volume   Culture NO GROWTH 5 DAYS  Final   Report Status 02/18/2017 FINAL  Final  Blood Culture (routine x 2)     Status: Abnormal   Collection Time: 02/13/17  5:29 PM  Result Value Ref Range Status   Specimen Description BLOOD RIGHT HAND  Final   Special Requests   Final    BOTTLES DRAWN AEROBIC AND ANAEROBIC Blood Culture adequate volume   Culture  Setup Time   Final    GRAM POSITIVE COCCI AEROBIC BOTTLE ONLY CRITICAL RESULT CALLED TO, READ BACK BY AND VERIFIED WITH: GARRETT COFFEE AT 1761 02/15/17 SDR    Culture (A)  Final    STAPHYLOCOCCUS SPECIES (COAGULASE NEGATIVE) THE SIGNIFICANCE OF ISOLATING THIS ORGANISM FROM A SINGLE SET OF BLOOD CULTURES WHEN MULTIPLE SETS ARE DRAWN IS UNCERTAIN. PLEASE NOTIFY THE MICROBIOLOGY DEPARTMENT WITHIN ONE WEEK IF SPECIATION AND SENSITIVITIES ARE REQUIRED. Performed at [facility], [place] [address] [place], [place] [address]    Report Status 02/17/2017 FINAL  Final  Urine culture     Status: Abnormal   Collection Time: 02/13/17  5:29  PM  Result Value Ref Range Status   Specimen Description URINE, RANDOM  Final   Special Requests NONE  Final   Culture (A)  Final    >=100,000 COLONIES/mL ESCHERICHIA COLI Confirmed Extended Spectrum Beta-Lactamase Producer (ESBL).  In bloodstream infections from ESBL organisms, carbapenems are preferred over piperacillin/tazobactam. They are shown to have a lower risk of mortality. Performed at [facility], [place] [address], [place], [place] [address]    Report  Status 02/16/2017 FINAL  Final   Organism ID, Bacteria ESCHERICHIA COLI (A)  Final      Susceptibility   Escherichia coli - MIC*    AMPICILLIN >=32 RESISTANT Resistant     CEFAZOLIN >=64 RESISTANT Resistant     CEFTRIAXONE >=64 RESISTANT Resistant     CIPROFLOXACIN >=4 RESISTANT Resistant     GENTAMICIN <=1 SENSITIVE Sensitive     IMIPENEM <=0.25 SENSITIVE Sensitive     NITROFURANTOIN <=16 SENSITIVE Sensitive     TRIMETH/SULFA >=320 RESISTANT Resistant     AMPICILLIN/SULBACTAM >=32 RESISTANT Resistant     PIP/TAZO <=4 SENSITIVE Sensitive     Extended ESBL POSITIVE Resistant     * >=100,000 COLONIES/mL ESCHERICHIA COLI  Blood Culture ID Panel (Reflexed)     Status: None   Collection Time: 02/13/17  5:29 PM  Result Value Ref Range Status   Enterococcus species NOT DETECTED NOT DETECTED Final   Listeria monocytogenes NOT DETECTED NOT DETECTED Final   Staphylococcus species NOT DETECTED NOT DETECTED Final   Staphylococcus aureus NOT DETECTED NOT DETECTED Final   Streptococcus species NOT DETECTED NOT DETECTED Final   Streptococcus agalactiae NOT DETECTED NOT DETECTED Final   Streptococcus pneumoniae NOT DETECTED NOT DETECTED Final   Streptococcus pyogenes NOT DETECTED NOT DETECTED Final   Acinetobacter baumannii NOT DETECTED NOT DETECTED Final   Enterobacteriaceae species NOT DETECTED NOT DETECTED Final   Enterobacter cloacae complex NOT DETECTED NOT DETECTED Final   Escherichia coli NOT DETECTED NOT DETECTED Final    Klebsiella oxytoca NOT DETECTED NOT DETECTED Final   Klebsiella pneumoniae NOT DETECTED NOT DETECTED Final   Proteus species NOT DETECTED NOT DETECTED Final   Serratia marcescens NOT DETECTED NOT DETECTED Final   Haemophilus influenzae NOT DETECTED NOT DETECTED Final   Neisseria meningitidis NOT DETECTED NOT DETECTED Final   Pseudomonas aeruginosa NOT DETECTED NOT DETECTED Final   Candida albicans NOT DETECTED NOT DETECTED Final   Candida glabrata NOT DETECTED NOT DETECTED Final   Candida krusei NOT DETECTED NOT DETECTED Final   Candida parapsilosis NOT DETECTED NOT DETECTED Final   Candida tropicalis NOT DETECTED NOT DETECTED Final  MRSA PCR Screening     Status: None   Collection Time: 02/14/17  1:23 AM  Result Value Ref Range Status   MRSA by PCR NEGATIVE NEGATIVE Final    Comment:        The GeneXpert MRSA Assay (FDA approved for NASAL specimens only), is one component of a comprehensive MRSA colonization surveillance program. It is not intended to diagnose MRSA infection nor to guide or monitor treatment for MRSA infections.   CSF culture     Status: None   Collection Time: 02/15/17 12:49 PM  Result Value Ref Range Status   Specimen Description CSF  Final   Special Requests Normal  Final   Gram Stain   Final     MODERATE WBC SEEN NO ORGANISMS SEEN RARE RBC SEEN    Culture   Final    NO GROWTH 3 DAYS Performed at [facility], [place] [address] [place], [place] [address]    Report Status 02/19/2017 FINAL  Final  Culture, fungus without smear     Status: None (Preliminary result)   Collection Time: 02/15/17 12:49 PM  Result Value Ref Range Status   Specimen Description CSF  Final   Special Requests NONE  Final   Culture   Final    NO FUNGUS ISOLATED AFTER 3 DAYS Performed at [facility], [address], [place], [place] [address]    Report Status PENDING  Incomplete  C difficile quick scan w PCR reflex     Status: None   Collection Time: 02/20/17  10:51 AM  Result Value Ref Range Status   C Diff antigen NEGATIVE NEGATIVE Final   C Diff toxin NEGATIVE NEGATIVE Final   C Diff interpretation No C. difficile detected.  Final    Coagulation Studies: No results for input(s): LABPROT, INR in the last 72 hours.  Urinalysis: No results for input(s): COLORURINE, LABSPEC, PHURINE, GLUCOSEU, HGBUR, BILIRUBINUR, KETONESUR, PROTEINUR, UROBILINOGEN, NITRITE, LEUKOCYTESUR in the last 72 hours.  Invalid input(s): APPERANCEUR    Imaging:  No results found.   Assessment & Plan: Pt is a 81 y.o. female with a PMHx of adnexal mass, history of bladder tumor, congestive heart failure, COPD, coronary artery disease, GERD, hyperidemia, hypertension, who was admitted to [facility] on 02/13/2017 for evaluation of sudden altered mental status.   1.  Acute renal failure, ? Etiology secondary to acyclovir vs sepsis from E. Coli. vs vancomycin. 2.  Acute respiratory failure. 3.  Encephalitis vs meningitis, patient has been on acyclovir, dose adjusted.   Plan:  It appears that the patient has normal renal function at baseline. She was originally admitted for altered mental status which may be secondary to encephalitis versus meningitis. She has been treated with acyclovir for approximately one week. Acyclovir can cause crystal nephropathy at times. Acyclovir has been now dose adjusted. We will start the patient on 0.9 normal saline at 75 cc per hour. In addition she was also on vancomycin. Her most recent vancomycin level was 13. We plan to repeat serum vancomycin level now. Patient also was found to have an E. Coli UTI at admission but has not been significantly hypotensives.   We will also obtain renal ultrasound. No urgent indication for dialysis at ths time, however this may need to considered if oliguria persists.[name] was updated on this and agreeable with the current plan. Thanks for consultation.

## 2017-02-20 NOTE — Progress Notes (Signed)
RR in 30's. No vent changes done so far this am to begin spontaneous breathing trial.  HR 120 even after lopressor, so Fentanyl 50 mcg given to ease respirations and any pain.  Daughter @bs . Updated on condition. Bubba Camp, RN

## 2017-02-20 NOTE — Progress Notes (Signed)
Per lab call, creatinine increased to 1.98.  Pt cont to have min to no uop.  Dr. Lyndel Safe paged.  Order for nephrology consult and 500cc NS bolus.  Also, informed neuro about request to re-eval pt due to being off sedation for almost a week with very little to no response to stimuli.  Per Dr. Earnest Conroy, Alexis Goodell, MD will see the pt tomorrow.  Bubba Camp, RN

## 2017-02-20 NOTE — Progress Notes (Signed)
[place] [facility] [place] NAME: Melissa Cochran    MR#:  [id number]  DATE OF BIRTH:  01/28/1936  SUBJECTIVE:  CHIEF COMPLAINT:   Chief Complaint  Patient presents with  . Seizures   Admitted for altered mental status, seizures, meningitis. Had to be intubated. LP done after stopping anticoagulation.  Intubated and on full vent support  REVIEW OF SYSTEMS:  Pt is intubated, can not give ROS.  ROS  DRUG ALLERGIES:  No Known Allergies  VITALS:  Blood pressure 131/70, pulse (!) 122, temperature 98.1 F (36.7 C), resp. rate (!) 27, height 5\' 5"  (1.651 m), weight 83.4 kg (183 lb 13.8 oz), SpO2 100 %.  PHYSICAL EXAMINATION:  GENERAL:  81 y.o.-year-old patient lying in the bed with no acute distress.  EYES: Pupils equal, round, reactive to light . No scleral icterus.   HEENT: Head atraumatic, normocephalic. Oropharynx and nasopharynx clear. ET tube NECK:  Supple, no jugular venous distention. No thyroid enlargement, no tenderness.  LUNGS: Normal breath sounds bilaterally, no wheezing, rales,rhonchi or crepitation. No use of accessory muscles of respiration. ETT , on vent support. CARDIOVASCULAR: S1, S2 normal. No murmurs, rubs, or gallops.  ABDOMEN: Soft, nontender, nondistended. Bowel sounds present. No organomegaly or mass.  EXTREMITIES: No pedal edema, cyanosis, or clubbing.  NEUROLOGIC: on vent.  Marland Kitchen PSYCHIATRIC: The patient is sedated.  SKIN: No obvious rash, lesion, or ulcer.   Physical Exam LABORATORY PANEL:   CBC  Recent Labs Lab 02/18/17 1620  WBC 13.3*  HGB 10.6*  HCT 32.6*  PLT 224   ------------------------------------------------------------------------------------------------------------------  Chemistries   Recent Labs Lab 02/13/17 1729  02/19/17 0444  NA 141  < > 137  K 4.8  < > 3.6  CL 108  < > 100*  CO2 22  < > 29  GLUCOSE 91  < > 157*  BUN 18  < > 38*  CREATININE 1.03*  < > 0.74  CALCIUM 8.5*  < >  8.8*  MG  --   < > 2.4  AST 72*  --   --   ALT 13*  --   --   ALKPHOS 120  --   --   BILITOT 0.5  --   --   < > = values in this interval not displayed. ------------------------------------------------------------------------------------------------------------------  Cardiac Enzymes  Recent Labs Lab 02/13/17 2135 02/14/17 0257  TROPONINI 11.29* 15.17*   ------------------------------------------------------------------------------------------------------------------  RADIOLOGY:  No results found.  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 VZV pending.   CT lumber spine checked as she had some blood at end of procedure ( Was on anticoagulant 2 days ago) negative contiunue Meropenem and Acyclovir.  * Status epilepticus  continue sedation medication IV, Keppra IV and neurology consult.  * Acute respiratory failure with hypoxia Continue ventilation and appreciate intensivist help  * ESBL E coli UTI On meropenem  * Elevated troponin. Possible due to demanding ischemia secondary to seizure, respiratory failure and sepsis.     Echo pending  * Accelerated hypertension. IV hydralazine when necessary.  * COPD. Stable.  * Chronic diastolic CHF. Stable.  Discussed with Dr. Lyndel Safe in the ICU  CODE STATUS: Full.  TOTAL TIME TAKING CARE OF THIS PATIENT: 35 minutes.   Neita Carp M.D on  02/20/2017   Between 7am to 6pm - Pager - [phone]  After 6pm go to [email or web address] - password [email or web address]  Office  [phone]  CC: Primary care physician; Teodoro Spray, MD  Note: This dictation was prepared with Dragon dictation along with smaller phrase technology. Any transcriptional errors that result from this process are unintentional.

## 2017-02-20 NOTE — Progress Notes (Signed)
Re paged Dr. Holley Raring regarding nephrology consult. Bubba Camp, RN

## 2017-02-20 NOTE — Progress Notes (Signed)
PATIENT NAME: Melissa Cochran    MR#:  [id number]  DATE OF BIRTH:  [birth year]  DATE OF ADMISSION:  02/13/2017  SUBJECTIVE Noted events overnight. Patient continues to be encephalopathic and does not follow commands.  DRUG ALLERGIES:  No Known Allergies  REVIEW OF SYSTEMS:   Review of Systems  Unable to perform ROS: Intubated    VITAL SIGNS:  Blood pressure 101/62, pulse (!) 101, temperature 98.9 F (37.2 C), temperature source Oral, resp. rate 20, height 5\' 5"  (1.651 m), weight 158 lb 8.2 oz (71.9 kg), SpO2 99 %.  PHYSICAL EXAMINATION:  Physical Exam  GENERAL:  nad HEENT: Head atraumatic, normocephalic.  NECK:  Supple, no jugular venous distention.  LUNGS: Normal breath sounds bilaterally, no wheezing, rales,rhonchi or crepitation. CARDIOVASCULAR: S1, S2 normal. No murmurs, rubs, or gallops.  ABDOMEN: Soft, nontender, nondistended. Bowel sounds present. No organomegaly or mass.  EXTREMITIES: No pedal edema, cyanosis, or clubbing.  NEUROLOGIC: moves extremities but does not follow commands   LABORATORY PANEL:   CBC  Recent Labs Lab 02/14/17 0257  WBC 17.0*  HGB 12.8  HCT 39.3  PLT 307   ------------------------------------------------------------------------------------------------------------------  Chemistries   Recent Labs Lab 02/13/17 1729  02/16/17 0906  NA 141  < > 136  K 4.8  < > 3.8  CL 108  < > 98*  CO2 22  < > 28  GLUCOSE 91  < > 146*  BUN 18  < > 29*  CREATININE 1.03*  < > 0.73  CALCIUM 8.5*  < > 8.5*  MG  --   < > 2.2  AST 72*  --   --   ALT 13*  --   --   ALKPHOS 120  --   --   BILITOT 0.5  --   --   < > = values in this interval not displayed. ------------------------------------------------------------------------------------------------------------------  Cardiac Enzymes  Recent Labs Lab 02/14/17 0257  TROPONINI 15.17*     IMPRESSION AND PLAN:  Toxic metabolic encephalopathy likely secondary to Status  epilepticus Antibiotics/AED as per neurology. Monitor mental status closely.  Acute respiratory failure with hypoxia Weaning from ventilator as tolerated. Will attempt a breathing trial again today but cannot extubate as GCS<8. Maintain saturations above 90%.  Sepsis due to ESBL UTI On meropenem.  DVT/GI prophylaxis. Family updated at bedside.  Cc: 32 minutes  Dimas Chyle MD PCCM

## 2017-02-20 NOTE — Progress Notes (Signed)
Pharmacy Antibiotic Note  Melissa Cochran is a 81 y.o. 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 Also treating for UTI due to ESBL e coli.  Pharmacy has been consulted for meropenem dosing and acyclovir dosing  Plan: Patient with AKI, SCr 1.98 (up from 0.74) est CrCl 24.35ml/min  Will adjust antimicrobials for CrCl < 25 ml/min  Meropenem 1gm IV Q12H  Acyclovir 650 mg IV Q24H  Continue to follow and adjust   Height: 5\' 5"  (165.1 cm) Weight: 183 lb 13.8 oz (83.4 kg) IBW/kg (Calculated) : 57  Temp (24hrs), Avg:98.5 F (36.9 C), Min:98.1 F (36.7 C), Max:98.9 F (37.2 C)   Recent Labs Lab 02/13/17 1729 02/13/17 2135 02/14/17 0257 02/15/17 0506 02/16/17 0906 02/17/17 1630 02/18/17 1620 02/19/17 0444 02/20/17 1039  WBC 12.6*  --  17.0*  --   --   --  13.3*  --   --   CREATININE 1.03*  --  0.89 0.70 0.73  --  0.65 0.74 1.98*  LATICACIDVEN 5.8* 3.6*  --   --   --   --   --   --   --   VANCOTROUGH  --   --   --   --   --  13*  --   --   --     Estimated Creatinine Clearance: 24.6 mL/min (A) (by C-G formula based on SCr of 1.98 mg/dL (H)).    No Known Allergies  Antimicrobials this admission: Zosyn 10/7 X 1 Ceftriaxone 10/8 >> 10/10 Ampicillin 10/8 >> 10/11 Acyclovir 10/8 >>  Vancomycin 10/8 >>  Meropenem 10/10 >>   Dose adjustments this admission:   Microbiology results: 10/7 BCx: coag neg staph (possible contaminant) 10/7 BCx: no growth 5 days 10/7 UCx: ESBL e coli  10/8 MRSA PCR: negative 10/9 CSF Cx: no growth 3 days 10/9 CSF Fungal Cx: no fungus isolated after 1 day 10/9 HSV PCR: negative 10/9 VZV PCR: pending   Thank you for allowing pharmacy to be a part of this patient's care.  Rosemary Pentecost C 02/20/2017 1:03 PM

## 2017-02-21 ENCOUNTER — Inpatient Hospital Stay: Payer: Medicare Other

## 2017-02-21 ENCOUNTER — Inpatient Hospital Stay (HOSPITAL_COMMUNITY): Payer: Medicare Other

## 2017-02-21 DIAGNOSIS — Z515 Encounter for palliative care: Secondary | ICD-10-CM

## 2017-02-21 DIAGNOSIS — J9600 Acute respiratory failure, unspecified whether with hypoxia or hypercapnia: Secondary | ICD-10-CM

## 2017-02-21 DIAGNOSIS — G40901 Epilepsy, unspecified, not intractable, with status epilepticus: Secondary | ICD-10-CM

## 2017-02-21 DIAGNOSIS — G9340 Encephalopathy, unspecified: Secondary | ICD-10-CM

## 2017-02-21 DIAGNOSIS — N170 Acute kidney failure with tubular necrosis: Secondary | ICD-10-CM

## 2017-02-21 DIAGNOSIS — G0490 Encephalitis and encephalomyelitis, unspecified: Secondary | ICD-10-CM

## 2017-02-21 DIAGNOSIS — N179 Acute kidney failure, unspecified: Secondary | ICD-10-CM

## 2017-02-21 DIAGNOSIS — R748 Abnormal levels of other serum enzymes: Secondary | ICD-10-CM

## 2017-02-21 LAB — BASIC METABOLIC PANEL
Anion gap: 14 (ref 5–15)
BUN: 88 mg/dL — AB (ref 6–20)
CHLORIDE: 99 mmol/L — AB (ref 101–111)
CO2: 22 mmol/L (ref 22–32)
Calcium: 8.3 mg/dL — ABNORMAL LOW (ref 8.9–10.3)
Creatinine, Ser: 2.34 mg/dL — ABNORMAL HIGH (ref 0.44–1.00)
GFR calc Af Amer: 22 mL/min — ABNORMAL LOW (ref >60)
GFR calc non Af Amer: 19 mL/min — ABNORMAL LOW (ref >60)
GLUCOSE: 127 mg/dL — AB (ref 65–99)
POTASSIUM: 4 mmol/L (ref 3.5–5.1)
Sodium: 135 mmol/L (ref 135–145)

## 2017-02-21 LAB — URINALYSIS, COMPLETE (UACMP) WITH MICROSCOPIC
BILIRUBIN URINE: NEGATIVE
Glucose, UA: NEGATIVE mg/dL
Ketones, ur: NEGATIVE mg/dL
LEUKOCYTES UA: NEGATIVE
NITRITE: NEGATIVE
Protein, ur: 100 mg/dL — AB
SPECIFIC GRAVITY, URINE: 1.018 (ref 1.005–1.030)
Squamous Epithelial / LPF: NONE SEEN
WBC, UA: NONE SEEN WBC/hpf (ref 0–5)
pH: 5 (ref 5.0–8.0)

## 2017-02-21 LAB — GLUCOSE, CAPILLARY
GLUCOSE-CAPILLARY: 111 mg/dL — AB (ref 65–99)
GLUCOSE-CAPILLARY: 125 mg/dL — AB (ref 65–99)
GLUCOSE-CAPILLARY: 140 mg/dL — AB (ref 65–99)
Glucose-Capillary: 106 mg/dL — ABNORMAL HIGH (ref 65–99)
Glucose-Capillary: 129 mg/dL — ABNORMAL HIGH (ref 65–99)
Glucose-Capillary: 129 mg/dL — ABNORMAL HIGH (ref 65–99)
Glucose-Capillary: 140 mg/dL — ABNORMAL HIGH (ref 65–99)

## 2017-02-21 LAB — PROTEIN / CREATININE RATIO, URINE
Creatinine, Urine: 57 mg/dL
Protein Creatinine Ratio: 1.74 mg/mg{Cre} — ABNORMAL HIGH (ref 0.00–0.15)
Total Protein, Urine: 99 mg/dL

## 2017-02-21 LAB — CK: CK TOTAL: 5 U/L — AB (ref 38–234)

## 2017-02-21 MED ORDER — FREE WATER
100.0000 mL | Freq: Three times a day (TID) | Status: DC
  Administered 2017-02-21 – 2017-02-22 (×3): 100 mL

## 2017-02-21 MED ORDER — VALPROATE SODIUM 500 MG/5ML IV SOLN
1000.0000 mg | Freq: Once | INTRAVENOUS | Status: AC
  Administered 2017-02-21: 1000 mg via INTRAVENOUS
  Filled 2017-02-21: qty 10

## 2017-02-21 MED ORDER — FUROSEMIDE 10 MG/ML IJ SOLN
40.0000 mg | Freq: Once | INTRAMUSCULAR | Status: AC
  Administered 2017-02-21: 40 mg via INTRAVENOUS
  Filled 2017-02-21: qty 4

## 2017-02-21 MED ORDER — VALPROATE SODIUM 500 MG/5ML IV SOLN
500.0000 mg | Freq: Two times a day (BID) | INTRAVENOUS | Status: DC
  Administered 2017-02-22: 500 mg via INTRAVENOUS
  Filled 2017-02-21 (×2): qty 5

## 2017-02-21 MED ORDER — PANTOPRAZOLE SODIUM 40 MG PO PACK
40.0000 mg | PACK | Freq: Every day | ORAL | Status: DC
  Administered 2017-02-21 – 2017-02-22 (×2): 40 mg
  Filled 2017-02-21 (×2): qty 20

## 2017-02-21 MED ORDER — SODIUM CHLORIDE 0.9 % IV SOLN
500.0000 mg | Freq: Two times a day (BID) | INTRAVENOUS | Status: DC
  Administered 2017-02-21 – 2017-02-22 (×3): 500 mg via INTRAVENOUS
  Filled 2017-02-21 (×5): qty 5

## 2017-02-21 NOTE — Progress Notes (Signed)
No charge note.   PMT meeting at 12 noon today with Dtr.  Florentina Jenny, PA-C Palliative Medicine Pager: [phone]

## 2017-02-21 NOTE — Progress Notes (Signed)
Pt's foley replaced per Dr.Simmonds order.  Will continue to assess.

## 2017-02-21 NOTE — Progress Notes (Signed)
[facility] INFECTIOUS DISEASE PROGRESS NOTE Date of Admission:  02/13/2017     ID: Melissa Cochran is a 81 y.o. 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 Minimally responsive.  ROS  Eleven systems are reviewed and negative except per hpi  Medications:  Antibiotics Given (last 72 hours)    Date/Time Action Medication Dose Rate   02/18/17 1458 New Bag/Given   acyclovir (ZOVIRAX) 650 mg in dextrose 5 % 100 mL IVPB 650 mg 113 mL/hr   02/18/17 2119 New Bag/Given   meropenem (MERREM) 2 g in sodium chloride 0.9 % 100 mL IVPB 2 g 200 mL/hr   02/18/17 2212 New Bag/Given   acyclovir (ZOVIRAX) 650 mg in dextrose 5 % 100 mL IVPB 650 mg 113 mL/hr   02/19/17 0515 New Bag/Given   meropenem (MERREM) 2 g in sodium chloride 0.9 % 100 mL IVPB 2 g 200 mL/hr   02/19/17 0650 New Bag/Given   acyclovir (ZOVIRAX) 650 mg in dextrose 5 % 100 mL IVPB 650 mg 113 mL/hr   02/19/17 1600 New Bag/Given   acyclovir (ZOVIRAX) 650 mg in dextrose 5 % 100 mL IVPB 650 mg 113 mL/hr   02/19/17 1814 New Bag/Given   meropenem (MERREM) 2 g in sodium chloride 0.9 % 100 mL IVPB 2 g 200 mL/hr   02/19/17 2138 New Bag/Given   meropenem (MERREM) 2 g in sodium chloride 0.9 % 100 mL IVPB 2 g 200 mL/hr   02/19/17 2138 New Bag/Given   acyclovir (ZOVIRAX) 650 mg in dextrose 5 % 100 mL IVPB 650 mg 113 mL/hr   02/20/17 0523 New Bag/Given   meropenem (MERREM) 2 g in sodium chloride 0.9 % 100 mL IVPB 2 g 200 mL/hr   02/20/17 0524 New Bag/Given   acyclovir (ZOVIRAX) 650 mg in dextrose 5 % 100 mL IVPB 650 mg 113 mL/hr   02/20/17 2227 New Bag/Given   meropenem (MERREM) 1 g in sodium chloride 0.9 % 100 mL IVPB 1 g 200 mL/hr   02/21/17 0458 New Bag/Given   acyclovir (ZOVIRAX) 650 mg in dextrose 5 %  100 mL IVPB 650 mg 113 mL/hr   02/21/17 0931 New Bag/Given   meropenem (MERREM) 1 g in sodium chloride 0.9 % 100 mL IVPB 1 g 200 mL/hr     . chlorhexidine gluconate (MEDLINE KIT)  15 mL Mouth Rinse BID  . feeding supplement (PRO-STAT SUGAR FREE 64)  30 mL Per Tube BID  . feeding supplement (VITAL 1.5 CAL)  1,000 mL Per Tube Q24H  . free water  100 mL Per Tube Q8H  . mouth rinse  15 mL Mouth Rinse 10 times per day  . multivitamin  15 mL Per Tube Daily  . pantoprazole sodium  40 mg Per Tube Q1200  . senna-docusate  1 tablet Per Tube BID  . sodium chloride flush  10-40 mL Intracatheter Q12H    Objective: Vital signs in last 24 hours: Temp:  [98.3 F (36.8 C)-98.9 F (37.2 C)] 98.3 F (36.8 C) (10/15 1200) Pulse Rate:  [105-120] 105 (10/15 1434) Resp:  [23-35] 33 (10/15 1434) BP: (124-194)/(67-107) 150/99 (10/15 1434) SpO2:  [89 %-100 %] 99 % (10/15 1434) FiO2 (%):  [28 %-35 %] 28 % (10/15 1200) Weight:  [84.7 kg (186 lb 11.7  oz)] 84.7 kg (186 lb 11.7 oz) (10/15 0500) Constitutional:  Intubated, unresponsive  HENT: , anicteric Mouth/Throat: ETT in place  Cardiovascular: Normal rate, regular rhythm and normal heart sounds. -Pulmonary/Chest: Effort normal and breath sounds normal. No respiratory distress.  has no wheezes.  Neck = supple, no nuchal rigidity Abdominal: Soft. Bowel sounds are normal.  exhibits no distension. There is no tenderness.  Lymphadenopathy: no cervical adenopathy. No axillary adenopathy Neurological: unresponsive Skin: Skin is warm and dry. No rash noted. No erythema.  Psychiatric unresponsive  Lab Results  Recent Labs  02/18/17 1620  02/20/17 1039 02/21/17 0453  WBC 13.3*  --   --   --   HGB 10.6*  --   --   --   HCT 32.6*  --   --   --   NA 135  < > 135 135  K 3.5  < > 3.8 4.0  CL 98*  < > 98* 99*  CO2 28  < > 24 22  BUN 36*  < > 75* 88*  CREATININE 0.65  < > 1.98* 2.34*  < > = values in this interval not displayed.  Microbiology: Results  for orders placed or performed during the hospital encounter of 02/13/17  Blood Culture (routine x 2)     Status: None   Collection Time: 02/13/17  5:29 PM  Result Value Ref Range Status   Specimen Description BLOOD LEFT HAND  Final   Special Requests   Final    BOTTLES DRAWN AEROBIC AND ANAEROBIC Blood Culture adequate volume   Culture NO GROWTH 5 DAYS  Final   Report Status 02/18/2017 FINAL  Final  Blood Culture (routine x 2)     Status: Abnormal   Collection Time: 02/13/17  5:29 PM  Result Value Ref Range Status   Specimen Description BLOOD RIGHT HAND  Final   Special Requests   Final    BOTTLES DRAWN AEROBIC AND ANAEROBIC Blood Culture adequate volume   Culture  Setup Time   Final    GRAM POSITIVE COCCI AEROBIC BOTTLE ONLY CRITICAL RESULT CALLED TO, READ BACK BY AND VERIFIED WITH: GARRETT COFFEE AT 3491 02/15/17 SDR    Culture (A)  Final    STAPHYLOCOCCUS SPECIES (COAGULASE NEGATIVE) THE SIGNIFICANCE OF ISOLATING THIS ORGANISM FROM A SINGLE SET OF BLOOD CULTURES WHEN MULTIPLE SETS ARE DRAWN IS UNCERTAIN. PLEASE NOTIFY THE MICROBIOLOGY DEPARTMENT WITHIN ONE WEEK IF SPECIATION AND SENSITIVITIES ARE REQUIRED. Performed at [facility], [place] [address] [place], [place] [address]    Report Status 02/17/2017 FINAL  Final  Urine culture     Status: Abnormal   Collection Time: 02/13/17  5:29 PM  Result Value Ref Range Status   Specimen Description URINE, RANDOM  Final   Special Requests NONE  Final   Culture (A)  Final    >=100,000 COLONIES/mL ESCHERICHIA COLI Confirmed Extended Spectrum Beta-Lactamase Producer (ESBL).  In bloodstream infections from ESBL organisms, carbapenems are preferred over piperacillin/tazobactam. They are shown to have a lower risk of mortality. Performed at [facility], [place] [address], [place], [place] [address]    Report Status 02/16/2017 FINAL  Final   Organism ID, Bacteria ESCHERICHIA COLI (A)  Final      Susceptibility   Escherichia coli -  MIC*    AMPICILLIN >=32 RESISTANT Resistant     CEFAZOLIN >=64 RESISTANT Resistant     CEFTRIAXONE >=64 RESISTANT Resistant     CIPROFLOXACIN >=4 RESISTANT Resistant  GENTAMICIN <=1 SENSITIVE Sensitive     IMIPENEM <=0.25 SENSITIVE Sensitive     NITROFURANTOIN <=16 SENSITIVE Sensitive     TRIMETH/SULFA >=320 RESISTANT Resistant     AMPICILLIN/SULBACTAM >=32 RESISTANT Resistant     PIP/TAZO <=4 SENSITIVE Sensitive     Extended ESBL POSITIVE Resistant     * >=100,000 COLONIES/mL ESCHERICHIA COLI  Blood Culture ID Panel (Reflexed)     Status: None   Collection Time: 02/13/17  5:29 PM  Result Value Ref Range Status   Enterococcus species NOT DETECTED NOT DETECTED Final   Listeria monocytogenes NOT DETECTED NOT DETECTED Final   Staphylococcus species NOT DETECTED NOT DETECTED Final   Staphylococcus aureus NOT DETECTED NOT DETECTED Final   Streptococcus species NOT DETECTED NOT DETECTED Final   Streptococcus agalactiae NOT DETECTED NOT DETECTED Final   Streptococcus pneumoniae NOT DETECTED NOT DETECTED Final   Streptococcus pyogenes NOT DETECTED NOT DETECTED Final   Acinetobacter baumannii NOT DETECTED NOT DETECTED Final   Enterobacteriaceae species NOT DETECTED NOT DETECTED Final   Enterobacter cloacae complex NOT DETECTED NOT DETECTED Final   Escherichia coli NOT DETECTED NOT DETECTED Final   Klebsiella oxytoca NOT DETECTED NOT DETECTED Final   Klebsiella pneumoniae NOT DETECTED NOT DETECTED Final   Proteus species NOT DETECTED NOT DETECTED Final   Serratia marcescens NOT DETECTED NOT DETECTED Final   Haemophilus influenzae NOT DETECTED NOT DETECTED Final   Neisseria meningitidis NOT DETECTED NOT DETECTED Final   Pseudomonas aeruginosa NOT DETECTED NOT DETECTED Final   Candida albicans NOT DETECTED NOT DETECTED Final   Candida glabrata NOT DETECTED NOT DETECTED Final   Candida krusei NOT DETECTED NOT DETECTED Final   Candida parapsilosis NOT DETECTED NOT DETECTED Final   Candida  tropicalis NOT DETECTED NOT DETECTED Final  MRSA PCR Screening     Status: None   Collection Time: 02/14/17  1:23 AM  Result Value Ref Range Status   MRSA by PCR NEGATIVE NEGATIVE Final    Comment:        The GeneXpert MRSA Assay (FDA approved for NASAL specimens only), is one component of a comprehensive MRSA colonization surveillance program. It is not intended to diagnose MRSA infection nor to guide or monitor treatment for MRSA infections.   CSF culture     Status: None   Collection Time: 02/15/17 12:49 PM  Result Value Ref Range Status   Specimen Description CSF  Final   Special Requests Normal  Final   Gram Stain   Final     MODERATE WBC SEEN NO ORGANISMS SEEN RARE RBC SEEN    Culture   Final    NO GROWTH 3 DAYS Performed at [facility], [place] [address] [place], [place] [address]    Report Status 02/19/2017 FINAL  Final  Culture, fungus without smear     Status: None (Preliminary result)   Collection Time: 02/15/17 12:49 PM  Result Value Ref Range Status   Specimen Description CSF  Final   Special Requests NONE  Final   Culture   Final    NO FUNGUS ISOLATED AFTER 3 DAYS Performed at [facility], [place] [address], [place], [place] [address]    Report Status PENDING  Incomplete  C difficile quick scan w PCR reflex     Status: None   Collection Time: 02/20/17 10:51 AM  Result Value Ref Range Status   C Diff antigen NEGATIVE NEGATIVE Final   C Diff toxin NEGATIVE NEGATIVE Final   C  Diff interpretation No C. difficile detected.  Final    Studies/Results: US Renal  Result Date: 02/20/2017 CLINICAL DATA:  81 y/o  F; acute renal failure. EXAM: RENAL / URINARY TRACT ULTRASOUND COMPLETE COMPARISON:  02/15/2017 FINDINGS: Right Kidney: Length: 12.1 cm. Normal echogenicity. No mass. Mild right hydronephrosis. Left Kidney: Length: 13.0 cm. Lower pole echogenic foci measuring up to 13 mm compatible with nephrolithiasis. No hydronephrosis. Bladder: Appears  normal for degree of bladder distention. IMPRESSION: 1. Mild right hydronephrosis. 2. Left kidney lower pole nephrolithiasis. Electronically Signed   By: Kristine Garbe M.D.   On: 02/20/2017 17:21   Assessment/Plan: Melissa Cochran is a 81 y.o. female admitted with seizures, AMS, fevers and leukocytosis. MRI concerning for encephalitis. UA concerning for UTI and UCX with ESBL E coli. BCX 1/2 GPC. LP shows elevated wbc and rbc.  HSV PCR negative.  Listeria also possible based on LP.   Recommendations Continue acyclovir pending VZV which I have sent on the CSF Meropenem added to cover the ESBL E coli in urine and will also cover Listeria and other CNS bacteria.  Thank you very much for the consult. Will follow with you.  Leonel Ramsay   02/21/2017, 2:53 PM

## 2017-02-21 NOTE — Progress Notes (Signed)
Pt. was transported from CCU to CT while on the transport vent. Transport went well and pt. Is stable.

## 2017-02-21 NOTE — Progress Notes (Signed)
PULMONARY / CRITICAL CARE MEDICINE   Name: Melissa Cochran MRN: [id number] DOB: Jul 08, 1935    ADMISSION DATE:  02/13/2017  PT PROFILE:   73 F admitted via ED with unresponsiveness and witnessed new onset seizure. Intubated in ED for airway protection and prolonged seizure  MAJOR EVENTS/TEST RESULTS: 10/07 Admission as above 10/07 CT head: No acute intracranial abnormality identified. Stable right parietal meningioma 10/08 Cardiology consultation for elevated trop I (peak 15.17) 10/08 Neurology consultation 10/08 Echocardiogram:  10/08 EEG: abnormal electroencephalogram due to sharp activity over the right hemisphere with phase reversal at F8. This is consistent with the patient's presentation of seizures and suggests a focal disturbance over right hemisphere with epileptogenic potential 10/08 MRI brain: Multifocal acute abnormal signal in the brain. No associated hemorrhage, enhancement, or mass effect. Abnormal signal in the right mesial temporal lobe, right thalamus, and right cingulate gyrus is nonspecific and might be postictal, but consider Acute Herpes Encephalitis if the patient is febrile and recommend empiric antiviral treatment. Abnormal signal in the right occipital pole, posterior right parietal lobe, and both cerebellar hemispheres most resembles several acute infarcts. A chronic 2.3 cm posterior right frontal convexity meningioma with mild regional mass effect but no associated edema is probably clinically silent. Patchy bilateral cerebral white matter and left thalamic signal abnormality, nonspecific but may be related to chronic small vessel disease 10/09 ID consultation 10/09 CT L-spine: No acute or unexpected finding  10/09 LP - RBC 5894. WBC 165 with 99% segs 10/14 Nephrology consultation 10/14 US renal: Mild right hydronephrosis. Left kidney lower pole nephrolithiasis 10/15 CTAP: Mild-to-moderate bilateral hydroureteronephrosis to the level of the ureterovesical  junctions bilaterally. Nonobstructing lower left renal stones. Punctate 2 mm left pelvic ureteral stone, which appears nonobstructing. No right renal or right ureteral stones. Moderately distended bladder despite the presence of an indwelling Foley catheter in the bladder. Recommend clinical evaluation to exclude Foley dysfunction. Small left and trace right bilateral dependent pleural effusions with associated mild bibasilar atelectasis. Anasarca. Complex bilateral cystic adnexal masses, stable on the right and mildly increased in size on the left, indeterminate for neoplasm 10/15 EEG:  10/15 Urology consultation for hydronephrosis 10/15 Palliative Care consultation  INDWELLING DEVICES:: ETT 10/07 >>  PICC 10/11 >>   MICRO DATA: MRSA PCR 10/08 >> NEG Urine 10/07 >> E coli (ESBL) Blood 10/07 >> 1/2 coag neg staph CSF 10/09 >> NEG C diff 10/14 >> NEG  ANTIMICROBIALS:  Anti-infectives    Start     Dose/Rate Route Frequency Ordered Stop   02/21/17 0530  acyclovir (ZOVIRAX) 650 mg in dextrose 5 % 100 mL IVPB     650 mg 113 mL/hr over 60 Minutes Intravenous Every 24 hours 02/20/17 1257     02/20/17 2200  meropenem (MERREM) 1 g in sodium chloride 0.9 % 100 mL IVPB     1 g 200 mL/hr over 30 Minutes Intravenous Every 12 hours 02/20/17 1302     02/17/17 2130  vancomycin (VANCOCIN) 1,250 mg in sodium chloride 0.9 % 250 mL IVPB  Status:  Discontinued     1,250 mg 166.7 mL/hr over 90 Minutes Intravenous Every 18 hours 02/17/17 2022 02/18/17 1557   02/17/17 2130  acyclovir (ZOVIRAX) 650 mg in dextrose 5 % 100 mL IVPB  Status:  Discontinued     650 mg 113 mL/hr over 60 Minutes Intravenous Every 8 hours 02/17/17 2022 02/20/17 1257   02/16/17 1400  meropenem (MERREM) 2 g in sodium chloride 0.9 % 100 mL IVPB  Status:  Discontinued     2 g 200 mL/hr over 30 Minutes Intravenous Every 8 hours 02/16/17 0959 02/20/17 1302   02/16/17 1000  meropenem (MERREM) 1 g in sodium chloride 0.9 % 100 mL IVPB     1  g 200 mL/hr over 30 Minutes Intravenous  Once 02/16/17 0959 02/16/17 1030   02/16/17 0930  meropenem (MERREM) 1 g in sodium chloride 0.9 % 100 mL IVPB  Status:  Discontinued     1 g 200 mL/hr over 30 Minutes Intravenous Every 8 hours 02/16/17 0919 02/16/17 0959   02/14/17 2200  cefTRIAXone (ROCEPHIN) 2 g in dextrose 5 % 50 mL IVPB  Status:  Discontinued     2 g 100 mL/hr over 30 Minutes Intravenous Every 12 hours 02/14/17 1110 02/16/17 0921   02/14/17 1700  vancomycin (VANCOCIN) IVPB 1000 mg/200 mL premix  Status:  Discontinued     1,000 mg 200 mL/hr over 60 Minutes Intravenous Every 18 hours 02/14/17 1118 02/17/17 2022   02/14/17 1130  acyclovir (ZOVIRAX) 650 mg in dextrose 5 % 100 mL IVPB  Status:  Discontinued     650 mg 113 mL/hr over 60 Minutes Intravenous Every 8 hours 02/14/17 1110 02/17/17 1108   02/14/17 1115  cefTRIAXone (ROCEPHIN) 1 g in dextrose 5 % 50 mL IVPB     1 g 100 mL/hr over 30 Minutes Intravenous  Once 02/14/17 1110 02/14/17 1359   02/14/17 1115  ampicillin (OMNIPEN) 2 g in sodium chloride 0.9 % 50 mL IVPB  Status:  Discontinued     2 g 150 mL/hr over 20 Minutes Intravenous Every 4 hours 02/14/17 1110 02/17/17 1108   02/14/17 1015  vancomycin (VANCOCIN) IVPB 1000 mg/200 mL premix     1,000 mg 200 mL/hr over 60 Minutes Intravenous  Once 02/14/17 1008 02/14/17 1213   02/14/17 0900  vancomycin (VANCOCIN) IVPB 1000 mg/200 mL premix  Status:  Discontinued     1,000 mg 200 mL/hr over 60 Minutes Intravenous Every 24 hours 02/13/17 1836 02/13/17 2101   02/14/17 0000  piperacillin-tazobactam (ZOSYN) IVPB 3.375 g  Status:  Discontinued     3.375 g 12.5 mL/hr over 240 Minutes Intravenous Every 8 hours 02/13/17 1836 02/13/17 2101   02/13/17 2200  cefTRIAXone (ROCEPHIN) 1 g in dextrose 5 % 50 mL IVPB  Status:  Discontinued     1 g 120 mL/hr over 30 Minutes Intravenous Every 24 hours 02/13/17 2102 02/14/17 1110   02/13/17 1730  piperacillin-tazobactam (ZOSYN) IVPB 3.375 g      3.375 g 100 mL/hr over 30 Minutes Intravenous  Once 02/13/17 1722 02/13/17 1819   02/13/17 1730  vancomycin (VANCOCIN) IVPB 1000 mg/200 mL premix     1,000 mg 200 mL/hr over 60 Minutes Intravenous  Once 02/13/17 1722 02/13/17 2331    ID service managing abx   SUBJECTIVE:  No sedation for greater than 72 hours. Deeply comatose. No withdrawal from pain  VITAL SIGNS: BP 140/72   Pulse (!) 107   Temp 98.2 F (36.8 C) (Axillary)   Resp (!) 24   Ht 5\' 5"  (1.651 m)   Wt 84.7 kg (186 lb 11.7 oz)   SpO2 100%   BMI 31.07 kg/m   HEMODYNAMICS:    VENTILATOR SETTINGS: Vent Mode: PRVC FiO2 (%):  [28 %-35 %] 28 % Set Rate:  [14 bmp] 14 bmp Vt Set:  [500 mL] 500 mL PEEP:  [5 cmH20] 5 cmH20 Pressure Support:  [10 [place]  Pressure:  [23 cmH20] 23 cmH20  INTAKE / OUTPUT: I/O last 3 completed shifts: In: 5695.6 [I.V.:1780.6; Other:40; NG/GT:2470; IV Piggyback:1405] Out: 360 [Urine:20; Stool:340]  PHYSICAL EXAMINATION: General: intubated, comatose Neuro: no spontaneous movement, no withdrawal HEENT: NCAT, sclerae white Cardiovascular: regular, no M Lungs: coarse breath sounds, no wheezes Abdomen: distended, firm, diminished BS Ext: warm, no edema  LABS:  BMET  Recent Labs Lab 02/19/17 0444 02/20/17 1039 02/21/17 0453  NA 137 135 135  K 3.6 3.8 4.0  CL 100* 98* 99*  CO2 29 24 22   BUN 38* 75* 88*  CREATININE 0.74 1.98* 2.34*  GLUCOSE 157* 160* 127*    Electrolytes  Recent Labs Lab 02/15/17 1735 02/16/17 0906  02/19/17 0444 02/20/17 1039 02/21/17 0453  CALCIUM  --  8.5*  < > 8.8* 8.9 8.3*  MG 2.2 2.2  --  2.4  --   --   PHOS 2.1* 3.2  --  2.6  --   --   < > = values in this interval not displayed.  CBC  Recent Labs Lab 02/18/17 1620  WBC 13.3*  HGB 10.6*  HCT 32.6*  PLT 224    Coag's  Recent Labs Lab 02/15/17 1027  APTT 30  INR 1.27    Sepsis Markers No results for input(s): LATICACIDVEN, PROCALCITON, O2SATVEN in the last  168 hours.  ABG  Recent Labs Lab 02/19/17 0500  PHART 7.54*  PCO2ART 33  PO2ART 114*    Liver Enzymes No results for input(s): AST, ALT, ALKPHOS, BILITOT, ALBUMIN in the last 168 hours.  Cardiac Enzymes No results for input(s): TROPONINI, PROBNP in the last 168 hours.  Glucose  Recent Labs Lab 02/20/17 1946 02/21/17 0003 02/21/17 0412 02/21/17 0723 02/21/17 1148 02/21/17 1804  GLUCAP 109* 129* 129* 140* 140* 106*    CXR: NNF    ASSESSMENT / PLAN:  PULMONARY A: Ventilator dependent respiratory failure - intubated for altered mental status P:   Cont full vent support - settings reviewed and/or adjusted Cont vent bundle Daily SBT if/when meets criteria   CARDIOVASCULAR A:  Paroxysmal atrial fibrillation NSTEMI Junctional bradycardia 10/15 P:  Discontinue amiodarone Not presently a candidate for CAD workup Echocardiogram results still pending  RENAL A:   AKI - very poor candidate for HD Apparent Foley catheter malfunction P:   Monitor BMET intermittently Monitor I/Os Correct electrolytes as indicated  Replace Foley catheter  GASTROINTESTINAL A:   Abdominal distention P:   SUP: enteral pantoprazole Continue TF protocol  HEMATOLOGIC A:   Mild anemia without acute blood loss P:  DVT px: SCDs Monitor CBC intermittently Transfuse per usual guidelines   INFECTIOUS A:   Meningoencephalitis  P:   Monitor temp, WBC count Micro and abx as above Antibiotics per ID service  ENDOCRINE A:   Mild hyperglycemia P:   Monitor glucose on chemistry panels Consider SSI for glu > 180  NEUROLOGIC A:   Coma Meningoencephalitis Very poor prognosis for functional neurological outcome P:   RASS goal: 0, -1 All sedation has been stopped Palliative care consultation requested 10/15  FAMILY:  I spoke with the patient's daughter and granddaughter in detail. I described the very poor prognosis for any kind of meaningful recovery. I emphasized that  we should focus on her comfort and dignity. Her daughter seems reluctant to accept any limitations take care at this point.  CCM time: 60 mins  The above time includes time spent in consultation with patient and/or family members and reviewing care plan  on multidisciplinary rounds  Merton Border, MD PCCM service Mobile [phone] Pager [phone] 02/21/2017, 6:32 PM

## 2017-02-21 NOTE — Progress Notes (Signed)
[place] [facility] [place] NAME: Melissa Cochran    MR#:  [id number]  DATE OF BIRTH:  Oct 28, 1935  SUBJECTIVE:  CHIEF COMPLAINT:   Chief Complaint  Patient presents with  . Seizures   Admitted for altered mental status, seizures, meningitis. Had to be intubated. LP done after stopping anticoagulation.  Intubated. Sedated.  REVIEW OF SYSTEMS:  Pt is intubated, can not give ROS.  ROS  DRUG ALLERGIES:  No Known Allergies  VITALS:  Blood pressure (!) 155/89, pulse (!) 111, temperature 98.3 F (36.8 C), temperature source Axillary, resp. rate (!) 28, height 5\' 5"  (1.651 m), weight 84.7 kg (186 lb 11.7 oz), SpO2 98 %.  PHYSICAL EXAMINATION:  GENERAL:  81 y.o.-year-old patient lying in the bed with no acute distress.  EYES: Pupils equal, round, reactive to light . No scleral icterus.   HEENT: Head atraumatic, normocephalic. Oropharynx and nasopharynx clear. ET tube NECK:  Supple, no jugular venous distention. No thyroid enlargement, no tenderness.  LUNGS: Normal breath sounds bilaterally, no wheezing, rales,rhonchi or crepitation. No use of accessory muscles of respiration. ETT , on vent support. CARDIOVASCULAR: S1, S2 normal. No murmurs, rubs, or gallops.  ABDOMEN: Soft, nontender, nondistended. Bowel sounds present. No organomegaly or mass.  EXTREMITIES: No pedal edema, cyanosis, or clubbing.  NEUROLOGIC: on vent.  Marland Kitchen PSYCHIATRIC: The patient is sedated.  SKIN: No obvious rash, lesion, or ulcer.   Physical Exam LABORATORY PANEL:   CBC  Recent Labs Lab 02/18/17 1620  WBC 13.3*  HGB 10.6*  HCT 32.6*  PLT 224   ------------------------------------------------------------------------------------------------------------------  Chemistries   Recent Labs Lab 02/19/17 0444  02/21/17 0453  NA 137  < > 135  K 3.6  < > 4.0  CL 100*  < > 99*  CO2 29  < > 22  GLUCOSE 157*  < > 127*  BUN 38*  < > 88*  CREATININE 0.74  < > 2.34*   CALCIUM 8.8*  < > 8.3*  MG 2.4  --   --   < > = values in this interval not displayed. ------------------------------------------------------------------------------------------------------------------  Cardiac Enzymes No results for input(s): TROPONINI in the last 168 hours. ------------------------------------------------------------------------------------------------------------------  RADIOLOGY:  US Renal  Result Date: 02/20/2017 CLINICAL DATA:  81 y/o  F; acute renal failure. EXAM: RENAL / URINARY TRACT ULTRASOUND COMPLETE COMPARISON:  02/15/2017 FINDINGS: Right Kidney: Length: 12.1 cm. Normal echogenicity. No mass. Mild right hydronephrosis. Left Kidney: Length: 13.0 cm. Lower pole echogenic foci measuring up to 13 mm compatible with nephrolithiasis. No hydronephrosis. Bladder: Appears normal for degree of bladder distention. IMPRESSION: 1. Mild right hydronephrosis. 2. Left kidney lower pole nephrolithiasis. Electronically Signed   By: Kristine Garbe M.D.   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 VZV pending.   CT lumber spine checked as she had some blood at end of procedure ( Was on anticoagulant 2 days ago) negative contiunue Meropenem and Acyclovir.  * Status epilepticus  continue sedation medication IV, Keppra IV and neurology consult.  * Acute respiratory failure with hypoxia Continue ventilation and appreciate intensivist help  * ESBL E coli UTI On meropenem  *  Elevated troponin. Possible due to demanding ischemia secondary to seizure, respiratory failure and sepsis.     Echo pending  * Accelerated hypertension. IV hydralazine when  necessary.  * COPD. Stable.  * Chronic diastolic CHF. Stable.  CODE STATUS: Full.  TOTAL TIME TAKING CARE OF THIS PATIENT: 35 minutes.   Hillary Bow R M.D on 02/21/2017   Between 7am to 6pm - Pager - [phone]  After 6pm go to [email or web address] - password [email or web address] [facility]  Office  [phone]  CC: Primary care physician; Teodoro Spray, MD  Note: This dictation was prepared with Dragon dictation along with smaller phrase technology. Any transcriptional errors that result from this process are unintentional.

## 2017-02-21 NOTE — Progress Notes (Signed)
1345 Pt transported to CT.   1420- Return from CT Pt HR 120's, BP 194/107, RR 33. Pt received 10 mg hydralazine per PRN orders. Pt HR and BP remained elevated. Pt given 5 mg Lopressor per PRN orders.     93- Dr. Leonidas Romberg called and notified of pt's HR dropping to 40's on monitor.  Verbal orders to for 12 lead EKG. Will continue to assess.

## 2017-02-21 NOTE — Progress Notes (Signed)
[facility]  ROUNDING NOTE   Subjective:   UOP 10.   Creatinine 2.34 (1.98)  Ultrasound with mild right sided hydronephrosis  Objective:  Vital signs in last 24 hours:  Temp:  [98.4 F (36.9 C)-98.9 F (37.2 C)] 98.6 F (37 C) (10/15 0400) Pulse Rate:  [107-119] 111 (10/15 0600) Resp:  [23-35] 23 (10/15 0600) BP: (109-175)/(55-92) 140/86 (10/15 0600) SpO2:  [89 %-100 %] 93 % (10/15 0820) FiO2 (%):  [28 %-35 %] 28 % (10/15 0820) Weight:  [84.7 kg (186 lb 11.7 oz)] 84.7 kg (186 lb 11.7 oz) (10/15 0500)  Weight change: 1.3 kg (2 lb 13.9 oz) Filed Weights   02/19/17 0700 02/20/17 0500 02/21/17 0500  Weight: 80.4 kg (177 lb 4 oz) 83.4 kg (183 lb 13.8 oz) 84.7 kg (186 lb 11.7 oz)    Intake/Output: I/O last 3 completed shifts: In: 5695.6 [I.V.:1780.6; Other:40; NG/GT:2470; IV Piggyback:1405] Out: 360 [Urine:20; Stool:340]   Intake/Output this shift:  No intake/output data recorded.  Physical Exam: General: Critically Ill  Head: +ETT  Eyes: Eyes closed  Neck: No JVD  Lungs:  Clear to auscultation, PRVC FiO2 28%  Heart: Regular rate and rhythm  Abdomen:  Soft, nontender  Extremities:  ++ peripheral edema.  Neurologic: Intubated, sedated  Skin: No lesions       Basic Metabolic Panel:  Recent Labs Lab 02/14/17 1330  02/15/17 0506 02/15/17 1735 02/16/17 0906 02/18/17 1620 02/19/17 0444 02/20/17 1039 02/21/17 0453  NA  --   < > 138  --  136 135 137 135 135  K  --   < > 3.4*  --  3.8 3.5 3.6 3.8 4.0  CL  --   < > 104  --  98* 98* 100* 98* 99*  CO2  --   < > 27  --  28 28 29 24 22  GLUCOSE  --   < > 174*  --  146* 131* 157* 160* 127*  BUN  --   < > 21*  --  29* 36* 38* 75* 88*  CREATININE  --   < > 0.70  --  0.73 0.65 0.74 1.98* 2.34*  CALCIUM  --   < > 8.7*  --  8.5* 8.4* 8.8* 8.9 8.3*  MG 1.9  --  2.2 2.2 2.2  --  2.4  --   --   PHOS 3.5  --  1.8* 2.1* 3.2  --  2.6  --   --   < > = values in this interval not displayed.  Liver Function  Tests: No results for input(s): AST, ALT, ALKPHOS, BILITOT, PROT, ALBUMIN in the last 168 hours. No results for input(s): LIPASE, AMYLASE in the last 168 hours. No results for input(s): AMMONIA in the last 168 hours.  CBC:  Recent Labs Lab 02/18/17 1620  WBC 13.3*  HGB 10.6*  HCT 32.6*  MCV 90.2  PLT 224    Cardiac Enzymes: No results for input(s): CKTOTAL, CKMB, CKMBINDEX, TROPONINI in the last 168 hours.  BNP: Invalid input(s): POCBNP  CBG:  Recent Labs Lab 02/20/17 1608 02/20/17 1946 02/21/17 0003 02/21/17 0412 02/21/17 0723  GLUCAP 148* 109* 129* 129* 140*    Microbiology: Results for orders placed or performed during the hospital encounter of 02/13/17  Blood Culture (routine x 2)     Status: None   Collection Time: 02/13/17  5:29 PM  Result Value Ref Range Status   Specimen Description BLOOD LEFT HAND  Final     Special Requests   Final    BOTTLES DRAWN AEROBIC AND ANAEROBIC Blood Culture adequate volume   Culture NO GROWTH 5 DAYS  Final   Report Status 02/18/2017 FINAL  Final  Blood Culture (routine x 2)     Status: Abnormal   Collection Time: 02/13/17  5:29 PM  Result Value Ref Range Status   Specimen Description BLOOD RIGHT HAND  Final   Special Requests   Final    BOTTLES DRAWN AEROBIC AND ANAEROBIC Blood Culture adequate volume   Culture  Setup Time   Final    GRAM POSITIVE COCCI AEROBIC BOTTLE ONLY CRITICAL RESULT CALLED TO, READ BACK BY AND VERIFIED WITH: GARRETT COFFEE AT 7026 02/15/17 SDR    Culture (A)  Final    STAPHYLOCOCCUS SPECIES (COAGULASE NEGATIVE) THE SIGNIFICANCE OF ISOLATING THIS ORGANISM FROM A SINGLE SET OF BLOOD CULTURES WHEN MULTIPLE SETS ARE DRAWN IS UNCERTAIN. PLEASE NOTIFY THE MICROBIOLOGY DEPARTMENT WITHIN ONE WEEK IF SPECIATION AND SENSITIVITIES ARE REQUIRED. Performed at [facility], [place] [address] [place], [place] [address]    Report Status 02/17/2017 FINAL  Final  Urine culture     Status: Abnormal   Collection  Time: 02/13/17  5:29 PM  Result Value Ref Range Status   Specimen Description URINE, RANDOM  Final   Special Requests NONE  Final   Culture (A)  Final    >=100,000 COLONIES/mL ESCHERICHIA COLI Confirmed Extended Spectrum Beta-Lactamase Producer (ESBL).  In bloodstream infections from ESBL organisms, carbapenems are preferred over piperacillin/tazobactam. They are shown to have a lower risk of mortality. Performed at [facility], [place] [address] [place], [place] [address]    Report Status 02/16/2017 FINAL  Final   Organism ID, Bacteria ESCHERICHIA COLI (A)  Final      Susceptibility   Escherichia coli - MIC*    AMPICILLIN >=32 RESISTANT Resistant     CEFAZOLIN >=64 RESISTANT Resistant     CEFTRIAXONE >=64 RESISTANT Resistant     CIPROFLOXACIN >=4 RESISTANT Resistant     GENTAMICIN <=1 SENSITIVE Sensitive     IMIPENEM <=0.25 SENSITIVE Sensitive     NITROFURANTOIN <=16 SENSITIVE Sensitive     TRIMETH/SULFA >=320 RESISTANT Resistant     AMPICILLIN/SULBACTAM >=32 RESISTANT Resistant     PIP/TAZO <=4 SENSITIVE Sensitive     Extended ESBL POSITIVE Resistant     * >=100,000 COLONIES/mL ESCHERICHIA COLI  Blood Culture ID Panel (Reflexed)     Status: None   Collection Time: 02/13/17  5:29 PM  Result Value Ref Range Status   Enterococcus species NOT DETECTED NOT DETECTED Final   Listeria monocytogenes NOT DETECTED NOT DETECTED Final   Staphylococcus species NOT DETECTED NOT DETECTED Final   Staphylococcus aureus NOT DETECTED NOT DETECTED Final   Streptococcus species NOT DETECTED NOT DETECTED Final   Streptococcus agalactiae NOT DETECTED NOT DETECTED Final   Streptococcus pneumoniae NOT DETECTED NOT DETECTED Final   Streptococcus pyogenes NOT DETECTED NOT DETECTED Final   Acinetobacter baumannii NOT DETECTED NOT DETECTED Final   Enterobacteriaceae species NOT DETECTED NOT DETECTED Final   Enterobacter cloacae complex NOT DETECTED NOT DETECTED Final   Escherichia coli NOT DETECTED  NOT DETECTED Final   Klebsiella oxytoca NOT DETECTED NOT DETECTED Final   Klebsiella pneumoniae NOT DETECTED NOT DETECTED Final   Proteus species NOT DETECTED NOT DETECTED Final   Serratia marcescens NOT DETECTED NOT DETECTED Final   Haemophilus influenzae NOT DETECTED NOT DETECTED Final   Neisseria meningitidis NOT DETECTED NOT  DETECTED Final   Pseudomonas aeruginosa NOT DETECTED NOT DETECTED Final   Candida albicans NOT DETECTED NOT DETECTED Final   Candida glabrata NOT DETECTED NOT DETECTED Final   Candida krusei NOT DETECTED NOT DETECTED Final   Candida parapsilosis NOT DETECTED NOT DETECTED Final   Candida tropicalis NOT DETECTED NOT DETECTED Final  MRSA PCR Screening     Status: None   Collection Time: 02/14/17  1:23 AM  Result Value Ref Range Status   MRSA by PCR NEGATIVE NEGATIVE Final    Comment:        The GeneXpert MRSA Assay (FDA approved for NASAL specimens only), is one component of a comprehensive MRSA colonization surveillance program. It is not intended to diagnose MRSA infection nor to guide or monitor treatment for MRSA infections.   CSF culture     Status: None   Collection Time: 02/15/17 12:49 PM  Result Value Ref Range Status   Specimen Description CSF  Final   Special Requests Normal  Final   Gram Stain   Final     MODERATE WBC SEEN NO ORGANISMS SEEN RARE RBC SEEN    Culture   Final    NO GROWTH 3 DAYS Performed at [facility], [address] [place], [place] [address]    Report Status 02/19/2017 FINAL  Final  Culture, fungus without smear     Status: None (Preliminary result)   Collection Time: 02/15/17 12:49 PM  Result Value Ref Range Status   Specimen Description CSF  Final   Special Requests NONE  Final   Culture   Final    NO FUNGUS ISOLATED AFTER 3 DAYS Performed at [facility], [address] [place], [place] [address]    Report Status PENDING  Incomplete  C difficile quick scan w PCR reflex     Status: None    Collection Time: 02/20/17 10:51 AM  Result Value Ref Range Status   C Diff antigen NEGATIVE NEGATIVE Final   C Diff toxin NEGATIVE NEGATIVE Final   C Diff interpretation No C. difficile detected.  Final    Coagulation Studies: No results for input(s): LABPROT, INR in the last 72 hours.  Urinalysis: No results for input(s): COLORURINE, LABSPEC, PHURINE, GLUCOSEU, HGBUR, BILIRUBINUR, KETONESUR, PROTEINUR, UROBILINOGEN, NITRITE, LEUKOCYTESUR in the last 72 hours.  Invalid input(s): APPERANCEUR    Imaging: Us Renal  Result Date: 02/20/2017 CLINICAL DATA:  81 y/o  F; acute renal failure. EXAM: RENAL / URINARY TRACT ULTRASOUND COMPLETE COMPARISON:  02/15/2017 FINDINGS: Right Kidney: Length: 12.1 cm. Normal echogenicity. No mass. Mild right hydronephrosis. Left Kidney: Length: 13.0 cm. Lower pole echogenic foci measuring up to 13 mm compatible with nephrolithiasis. No hydronephrosis. Bladder: Appears normal for degree of bladder distention. IMPRESSION: 1. Mild right hydronephrosis. 2. Left kidney lower pole nephrolithiasis. Electronically Signed   By: Lance  Furusawa-Stratton M.D.   On: 02/20/2017 17:21     Medications:   . sodium chloride 75 mL/hr at 02/21/17 0600  . acyclovir Stopped (02/21/17 0558)  . amiodarone 30 mg/hr (02/21/17 0158)  . levETIRAcetam Stopped (02/21/17 0058)  . meropenem (MERREM) IV Stopped (02/20/17 2257)  . midazolam (VERSED) infusion Stopped (02/14/17 0800)   . amiodarone  150 mg Intravenous Once  . chlorhexidine gluconate (MEDLINE KIT)  15 mL Mouth Rinse BID  . feeding supplement (PRO-STAT SUGAR FREE 64)  30 mL Per Tube BID  . feeding supplement (VITAL 1.5 CAL)  1,000 mL Per Tube Q24H  . free water    175 mL Per Tube Q6H  . mouth rinse  15 mL Mouth Rinse 10 times per day  . multivitamin  15 mL Per Tube Daily  . pantoprazole (PROTONIX) IV  40 mg Intravenous Q24H  . senna-docusate  1 tablet Per Tube BID  . sodium chloride flush  10-40 mL Intracatheter Q12H    acetaminophen **OR** acetaminophen, albuterol, fentaNYL (SUBLIMAZE) injection, hydrALAZINE, metoprolol tartrate, ondansetron **OR** ondansetron (ZOFRAN) IV, sodium chloride flush  Assessment/ Plan:  Ms. Jamese C Monrroy is a 81 y.o. white female with adnexal mass, history of bladder tumor, congestive heart failure, COPD, coronary artery disease, GERD, hyperidemia, hypertension, who was admitted to [facility] on 02/13/2017\  1. Acute renal failure: nonoliguric urine output. Baseline creatinine of 0.74 on 10/13.  Urinalysis with hematuria, glucosuria and proteinuria. No IV contrast exposure Concern for ATN and acyclovir nephrotoxicity.  - Continue IV fluids: NS at 75mL/hr - IV furosemide 40mg IV x 1 - Monitor volume status. No acute indication for dialysis.  - Check CK, urinalysis and spot urine protein to creatinine ratio.   2. Urinary tract infection and sepsis/bacteremia: 10/7 E. Coli - meropenem.    LOS: 8 ,  10/15/20188:34 AM  

## 2017-02-21 NOTE — Progress Notes (Signed)
Subjective: Patient remains intubated and unresponsive.  Nursing has noted some twitching of the LUE.    Objective: Current vital signs: BP (!) 150/99 (BP Location: Right Leg)   Pulse (!) 105   Temp 98.3 F (36.8 C) (Axillary)   Resp (!) 33   Ht '5\' 5"'$  (1.651 m)   Wt 84.7 kg (186 lb 11.7 oz)   SpO2 99%   BMI 31.07 kg/m  Vital signs in last 24 hours: Temp:  [98.3 F (36.8 C)-98.9 F (37.2 C)] 98.3 F (36.8 C) (10/15 1200) Pulse Rate:  [105-120] 105 (10/15 1434) Resp:  [23-35] 33 (10/15 1434) BP: (124-194)/(67-107) 150/99 (10/15 1434) SpO2:  [89 %-100 %] 99 % (10/15 1434) FiO2 (%):  [28 %-35 %] 28 % (10/15 1200) Weight:  [84.7 kg (186 lb 11.7 oz)] 84.7 kg (186 lb 11.7 oz) (10/15 0500)  Intake/Output from previous day: 10/14 0701 - 10/15 0700 In: 2935.3 [I.V.:1367.3; NG/GT:1135; IV Piggyback:433] Out: 310 [Urine:10; Stool:300] Intake/Output this shift: Total I/O In: 1140.4 [I.V.:755.4; NG/GT:280; IV Piggyback:105] Out: 135 [Urine:10; Stool:125] Nutritional status:    Neurologic Exam: Mental Status: Patient does not respond to verbal stimuli.  Does not respond to deep sternal rub.  Does not follow commands.  No verbalizations noted.  Cranial Nerves: II: patient does not respond confrontation bilaterally, pupils right 3 mm, left 3 mm,and reactive bilaterally III,IV,VI: Absent oculocephalic maneuvers bilaterally.  V,VII: corneal reflex present bilaterally  VIII: patient does not respond to verbal stimuli IX,X: gag reflex unable to test, XI: trapezius strength unable to test bilaterally XII: tongue strength unable to test Motor: No spontaneous movement noted Sensory: Does not respond to noxious stimuli in any extremity. Deep Tendon Reflexes:  2+ throughout with absent AJ's bilaterally.   Lab Results: Basic Metabolic Panel:  Recent Labs Lab 02/15/17 0506 02/15/17 1735 02/16/17 0906 02/18/17 1620 02/19/17 0444 02/20/17 1039 02/21/17 0453  NA 138  --  136 135  137 135 135  K 3.4*  --  3.8 3.5 3.6 3.8 4.0  CL 104  --  98* 98* 100* 98* 99*  CO2 27  --  '28 28 29 24 22  '$ GLUCOSE 174*  --  146* 131* 157* 160* 127*  BUN 21*  --  29* 36* 38* 75* 88*  CREATININE 0.70  --  0.73 0.65 0.74 1.98* 2.34*  CALCIUM 8.7*  --  8.5* 8.4* 8.8* 8.9 8.3*  MG 2.2 2.2 2.2  --  2.4  --   --   PHOS 1.8* 2.1* 3.2  --  2.6  --   --     Liver Function Tests: No results for input(s): AST, ALT, ALKPHOS, BILITOT, PROT, ALBUMIN in the last 168 hours. No results for input(s): LIPASE, AMYLASE in the last 168 hours. No results for input(s): AMMONIA in the last 168 hours.  CBC:  Recent Labs Lab 02/18/17 1620  WBC 13.3*  HGB 10.6*  HCT 32.6*  MCV 90.2  PLT 224    Cardiac Enzymes:  Recent Labs Lab 02/21/17 0453  CKTOTAL 5*    Lipid Panel: No results for input(s): CHOL, TRIG, HDL, CHOLHDL, VLDL, LDLCALC in the last 168 hours.  CBG:  Recent Labs Lab 02/20/17 1946 02/21/17 0003 02/21/17 0412 02/21/17 0723 02/21/17 1148  GLUCAP 109* 129* 129* 140* 140*    Microbiology: Results for orders placed or performed during the hospital encounter of 02/13/17  Blood Culture (routine x 2)     Status: None   Collection Time: 02/13/17  5:29 PM  Result Value Ref Range Status   Specimen Description BLOOD LEFT HAND  Final   Special Requests   Final    BOTTLES DRAWN AEROBIC AND ANAEROBIC Blood Culture adequate volume   Culture NO GROWTH 5 DAYS  Final   Report Status 02/18/2017 FINAL  Final  Blood Culture (routine x 2)     Status: Abnormal   Collection Time: 02/13/17  5:29 PM  Result Value Ref Range Status   Specimen Description BLOOD RIGHT HAND  Final   Special Requests   Final    BOTTLES DRAWN AEROBIC AND ANAEROBIC Blood Culture adequate volume   Culture  Setup Time   Final    GRAM POSITIVE COCCI AEROBIC BOTTLE ONLY CRITICAL RESULT CALLED TO, READ BACK BY AND VERIFIED WITH: GARRETT COFFEE AT 7106 02/15/17 SDR    Culture (A)  Final    STAPHYLOCOCCUS SPECIES  (COAGULASE NEGATIVE) THE SIGNIFICANCE OF ISOLATING THIS ORGANISM FROM A SINGLE SET OF BLOOD CULTURES WHEN MULTIPLE SETS ARE DRAWN IS UNCERTAIN. PLEASE NOTIFY THE MICROBIOLOGY DEPARTMENT WITHIN ONE WEEK IF SPECIATION AND SENSITIVITIES ARE REQUIRED. Performed at [facility], [place] [address], [place], [place] [address]    Report Status 02/17/2017 FINAL  Final  Urine culture     Status: Abnormal   Collection Time: 02/13/17  5:29 PM  Result Value Ref Range Status   Specimen Description URINE, RANDOM  Final   Special Requests NONE  Final   Culture (A)  Final    >=100,000 COLONIES/mL ESCHERICHIA COLI Confirmed Extended Spectrum Beta-Lactamase Producer (ESBL).  In bloodstream infections from ESBL organisms, carbapenems are preferred over piperacillin/tazobactam. They are shown to have a lower risk of mortality. Performed at [facility], [place] [address] [place], [place] [address]    Report Status 02/16/2017 FINAL  Final   Organism ID, Bacteria ESCHERICHIA COLI (A)  Final      Susceptibility   Escherichia coli - MIC*    AMPICILLIN >=32 RESISTANT Resistant     CEFAZOLIN >=64 RESISTANT Resistant     CEFTRIAXONE >=64 RESISTANT Resistant     CIPROFLOXACIN >=4 RESISTANT Resistant     GENTAMICIN <=1 SENSITIVE Sensitive     IMIPENEM <=0.25 SENSITIVE Sensitive     NITROFURANTOIN <=16 SENSITIVE Sensitive     TRIMETH/SULFA >=320 RESISTANT Resistant     AMPICILLIN/SULBACTAM >=32 RESISTANT Resistant     PIP/TAZO <=4 SENSITIVE Sensitive     Extended ESBL POSITIVE Resistant     * >=100,000 COLONIES/mL ESCHERICHIA COLI  Blood Culture ID Panel (Reflexed)     Status: None   Collection Time: 02/13/17  5:29 PM  Result Value Ref Range Status   Enterococcus species NOT DETECTED NOT DETECTED Final   Listeria monocytogenes NOT DETECTED NOT DETECTED Final   Staphylococcus species NOT DETECTED NOT DETECTED Final   Staphylococcus aureus NOT DETECTED NOT DETECTED Final   Streptococcus species NOT  DETECTED NOT DETECTED Final   Streptococcus agalactiae NOT DETECTED NOT DETECTED Final   Streptococcus pneumoniae NOT DETECTED NOT DETECTED Final   Streptococcus pyogenes NOT DETECTED NOT DETECTED Final   Acinetobacter baumannii NOT DETECTED NOT DETECTED Final   Enterobacteriaceae species NOT DETECTED NOT DETECTED Final   Enterobacter cloacae complex NOT DETECTED NOT DETECTED Final   Escherichia coli NOT DETECTED NOT DETECTED Final   Klebsiella oxytoca NOT DETECTED NOT DETECTED Final   Klebsiella pneumoniae NOT DETECTED NOT DETECTED Final   Proteus species NOT DETECTED NOT DETECTED Final   Serratia marcescens NOT DETECTED NOT DETECTED Final  Haemophilus influenzae NOT DETECTED NOT DETECTED Final   Neisseria meningitidis NOT DETECTED NOT DETECTED Final   Pseudomonas aeruginosa NOT DETECTED NOT DETECTED Final   Candida albicans NOT DETECTED NOT DETECTED Final   Candida glabrata NOT DETECTED NOT DETECTED Final   Candida krusei NOT DETECTED NOT DETECTED Final   Candida parapsilosis NOT DETECTED NOT DETECTED Final   Candida tropicalis NOT DETECTED NOT DETECTED Final  MRSA PCR Screening     Status: None   Collection Time: 02/14/17  1:23 AM  Result Value Ref Range Status   MRSA by PCR NEGATIVE NEGATIVE Final    Comment:        The GeneXpert MRSA Assay (FDA approved for NASAL specimens only), is one component of a comprehensive MRSA colonization surveillance program. It is not intended to diagnose MRSA infection nor to guide or monitor treatment for MRSA infections.   CSF culture     Status: None   Collection Time: 02/15/17 12:49 PM  Result Value Ref Range Status   Specimen Description CSF  Final   Special Requests Normal  Final   Gram Stain   Final     MODERATE WBC SEEN NO ORGANISMS SEEN RARE RBC SEEN    Culture   Final    NO GROWTH 3 DAYS Performed at [facility], [place] [address] [place], [place] [address]    Report Status 02/19/2017 FINAL  Final  Culture, fungus  without smear     Status: None (Preliminary result)   Collection Time: 02/15/17 12:49 PM  Result Value Ref Range Status   Specimen Description CSF  Final   Special Requests NONE  Final   Culture   Final    NO FUNGUS ISOLATED AFTER 3 DAYS Performed at [facility], [place] [address], [place], [place] [address]    Report Status PENDING  Incomplete  C difficile quick scan w PCR reflex     Status: None   Collection Time: 02/20/17 10:51 AM  Result Value Ref Range Status   C Diff antigen NEGATIVE NEGATIVE Final   C Diff toxin NEGATIVE NEGATIVE Final   C Diff interpretation No C. difficile detected.  Final    Coagulation Studies: No results for input(s): LABPROT, INR in the last 72 hours.  Imaging: US Renal  Result Date: 02/20/2017 CLINICAL DATA:  81 y/o  F; acute renal failure. EXAM: RENAL / URINARY TRACT ULTRASOUND COMPLETE COMPARISON:  02/15/2017 FINDINGS: Right Kidney: Length: 12.1 cm. Normal echogenicity. No mass. Mild right hydronephrosis. Left Kidney: Length: 13.0 cm. Lower pole echogenic foci measuring up to 13 mm compatible with nephrolithiasis. No hydronephrosis. Bladder: Appears normal for degree of bladder distention. IMPRESSION: 1. Mild right hydronephrosis. 2. Left kidney lower pole nephrolithiasis. Electronically Signed   By: Kristine Garbe M.D.   On: 02/20/2017 17:21    Medications:  I have reviewed the patient's current medications. Scheduled: . chlorhexidine gluconate (MEDLINE KIT)  15 mL Mouth Rinse BID  . feeding supplement (PRO-STAT SUGAR FREE 64)  30 mL Per Tube BID  . feeding supplement (VITAL 1.5 CAL)  1,000 mL Per Tube Q24H  . free water  100 mL Per Tube Q8H  . mouth rinse  15 mL Mouth Rinse 10 times per day  . multivitamin  15 mL Per Tube Daily  . pantoprazole sodium  40 mg Per Tube Q1200  . senna-docusate  1 tablet Per Tube BID  . sodium chloride flush  10-40 mL Intracatheter Q12H    Assessment/Plan: Patient  remains unresponsive.  Now  experiencing renal insufficiency.  Keppra dose has been decreased.  May decrease further based on further testing.    ID directing antibiotic use.   No longer febrile but prognosis poor.    Recommendations: 1.  EEG today.  Will further adjust Keppra if necessary  Will continue to follow with you   Case discussed with Dr. Alva Garnet    LOS: 8 days   Alexis Goodell, MD Neurology [phone] 02/21/2017  2:41 PM

## 2017-02-21 NOTE — Consult Note (Signed)
Reason for Consult: Acute Renal Failure, Rt Ureteral Joaquim Lai, Urosepsis  Referring Physician: S. Sudini MD  Melissa Cochran is an 81 y.o. female. '  HPI:   1 - Acute Renal Failure - Cr 2.3 up from baseline <1 during admission for sepsis, seizures. Renal SU with mild Rt hydro and L spine CT with likely partially obstructing right ureteral stone.   2 -  Rt Ureteral Stone - approx 77m Rt mid ureteral stone on spine CT this admission, bilateral non-obstructing renal stones as well.  3 - Urosepsis - clinical sepsis from possivel urinary v. Meningeal source. UCody10/7 with e. Coli sens merrem (placed on), BCX 10/7 no growth to date.  Today "CDaviana is seen in consultation for above. She is critically ill in ICU and has been minimally responsive on vent for several days. Her daughter provides history. Palliative consult pending.    Past Medical History:  Diagnosis Date  . Adnexal mass   . Anxiety   . Arthritis   . Bladder tumor   . Bleeding ulcer   . CHF [place]ive heart failure) ([place]   . [place]   . COPD (chronic obstructive pulmonary disease) ([place]   . Coronary artery disease   . GERD (gastroesophageal reflux disease)   . Heart block    left  . History of bleeding ulcers   . Hyperlipemia   . Hypertension   . Pneumonia   . Shortness of breath dyspnea     Past Surgical History:  Procedure Laterality Date  . ABDOMINAL HYSTERECTOMY    . CARDIAC CATHETERIZATION Left 10/21/2015   Procedure: Left Heart Cath and Coronary Angiography;  Surgeon: AIsaias Cowman MD;  [facility];  Service: Cardiovascular;  Laterality: Left;  . CARDIAC CATHETERIZATION N/A 10/21/2015   Procedure: Coronary Stent Intervention;  Surgeon: AIsaias Cowman MD;  [facility];  Service: Cardiovascular;  Laterality: N/A;  . CARDIAC SURGERY     cardiac cath  . CYSTOSCOPY W/ RETROGRADES Bilateral 01/20/2016   Procedure: CYSTOSCOPY WITH  RETROGRADE PYELOGRAM;  Surgeon: AHollice Espy MD;  Location: [facility];  Service: Urology;  Laterality: Bilateral;  . CYSTOSCOPY WITH STENT PLACEMENT Right 01/20/2016   Procedure: CYSTOSCOPY WITH STENT PLACEMENT;  Surgeon: AHollice Espy MD;  Location: [facility];  Service: Urology;  Laterality: Right;  . HIP SURGERY     left  . STOMACH SURGERY    . TRANSURETHRAL RESECTION OF BLADDER TUMOR N/A 01/20/2016   Procedure: TRANSURETHRAL RESECTION OF BLADDER TUMOR (TURBT);  Surgeon: AHollice Espy MD;  Location: [facility];  Service: Urology;  Laterality: N/A;    Family History  Problem Relation Age of Onset  . Stomach cancer Mother   . CVA Father   . Bladder Cancer Neg Hx   . Kidney cancer Neg Hx     Social History:  reports that she quit smoking about 18 years ago. She has a 40.00 pack-year smoking history. She has never used smokeless tobacco. She reports that she does not drink alcohol or use drugs.  Allergies: No Known Allergies  Medications: I have reviewed the patient's current medications.  Results for orders placed or performed during the hospital encounter of 02/13/17 (from the past 48 hour(s))  Glucose, capillary     Status: Abnormal   Collection Time: 02/19/17  4:13 PM  Result Value Ref Range   Glucose-Capillary 139 (H) 65 - 99 mg/dL  Glucose, capillary     Status: Abnormal   Collection Time:  02/19/17  7:57 PM  Result Value Ref Range   Glucose-Capillary 159 (H) 65 - 99 mg/dL  Glucose, capillary     Status: Abnormal   Collection Time: 02/19/17 11:57 PM  Result Value Ref Range   Glucose-Capillary 186 (H) 65 - 99 mg/dL  Glucose, capillary     Status: Abnormal   Collection Time: 02/20/17  4:24 AM  Result Value Ref Range   Glucose-Capillary 157 (H) 65 - 99 mg/dL  Glucose, capillary     Status: Abnormal   Collection Time: 02/20/17  8:23 AM  Result Value Ref Range   Glucose-Capillary 149 (H) 65 - 99 mg/dL  Basic metabolic panel     Status: Abnormal   Collection Time: 02/20/17  10:39 AM  Result Value Ref Range   Sodium 135 135 - 145 mmol/L   Potassium 3.8 3.5 - 5.1 mmol/L   Chloride 98 (L) 101 - 111 mmol/L   CO2 24 22 - 32 mmol/L   Glucose, Bld 160 (H) 65 - 99 mg/dL   BUN 75 (H) 6 - 20 mg/dL   Creatinine, Ser 1.98 (H) 0.44 - 1.00 mg/dL   Calcium 8.9 8.9 - 10.3 mg/dL   GFR calc non Af Amer 23 (L) >60 mL/min   GFR calc Af Amer 26 (L) >60 mL/min    Comment: (NOTE) The eGFR has been calculated using the CKD EPI equation. This calculation has not been validated in all clinical situations. eGFR's persistently <60 mL/min signify possible Chronic Kidney Disease.    Anion gap 13 5 - 15  C difficile quick scan w PCR reflex     Status: None   Collection Time: 02/20/17 10:51 AM  Result Value Ref Range   C Diff antigen NEGATIVE NEGATIVE   C Diff toxin NEGATIVE NEGATIVE   C Diff interpretation No C. difficile detected.   Glucose, capillary     Status: Abnormal   Collection Time: 02/20/17 12:22 PM  Result Value Ref Range   Glucose-Capillary 143 (H) 65 - 99 mg/dL  Vancomycin, random     Status: None   Collection Time: 02/20/17  4:00 PM  Result Value Ref Range   Vancomycin Rm 9     Comment:        Random Vancomycin therapeutic range is dependent on dosage and time of specimen collection. A peak range is 20.0-40.0 ug/mL A trough range is 5.0-15.0 ug/mL          Glucose, capillary     Status: Abnormal   Collection Time: 02/20/17  4:08 PM  Result Value Ref Range   Glucose-Capillary 148 (H) 65 - 99 mg/dL  Glucose, capillary     Status: Abnormal   Collection Time: 02/20/17  7:46 PM  Result Value Ref Range   Glucose-Capillary 109 (H) 65 - 99 mg/dL  Glucose, capillary     Status: Abnormal   Collection Time: 02/21/17 12:03 AM  Result Value Ref Range   Glucose-Capillary 129 (H) 65 - 99 mg/dL  Glucose, capillary     Status: Abnormal   Collection Time: 02/21/17  4:12 AM  Result Value Ref Range   Glucose-Capillary 129 (H) 65 - 99 mg/dL  Basic metabolic panel      Status: Abnormal   Collection Time: 02/21/17  4:53 AM  Result Value Ref Range   Sodium 135 135 - 145 mmol/L   Potassium 4.0 3.5 - 5.1 mmol/L   Chloride 99 (L) 101 - 111 mmol/L   CO2 22 22 - 32 mmol/L  Glucose, Bld 127 (H) 65 - 99 mg/dL   BUN 88 (H) 6 - 20 mg/dL   Creatinine, Ser 2.34 (H) 0.44 - 1.00 mg/dL   Calcium 8.3 (L) 8.9 - 10.3 mg/dL   GFR calc non Af Amer 19 (L) >60 mL/min   GFR calc Af Amer 22 (L) >60 mL/min    Comment: (NOTE) The eGFR has been calculated using the CKD EPI equation. This calculation has not been validated in all clinical situations. eGFR's persistently <60 mL/min signify possible Chronic Kidney Disease.    Anion gap 14 5 - 15  CK     Status: Abnormal   Collection Time: 02/21/17  4:53 AM  Result Value Ref Range   Total CK 5 (L) 38 - 234 U/L  Glucose, capillary     Status: Abnormal   Collection Time: 02/21/17  7:23 AM  Result Value Ref Range   Glucose-Capillary 140 (H) 65 - 99 mg/dL  Urinalysis, Complete w Microscopic     Status: Abnormal   Collection Time: 02/21/17  9:21 AM  Result Value Ref Range   Color, Urine YELLOW (A) YELLOW   APPearance HAZY (A) CLEAR   Specific Gravity, Urine 1.018 1.005 - 1.030   pH 5.0 5.0 - 8.0   Glucose, UA NEGATIVE NEGATIVE mg/dL   Hgb urine dipstick MODERATE (A) NEGATIVE   Bilirubin Urine NEGATIVE NEGATIVE   Ketones, ur NEGATIVE NEGATIVE mg/dL   Protein, ur 100 (A) NEGATIVE mg/dL   Nitrite NEGATIVE NEGATIVE   Leukocytes, UA NEGATIVE NEGATIVE   RBC / HPF 6-30 0 - 5 RBC/hpf   WBC, UA NONE SEEN 0 - 5 WBC/hpf   Bacteria, UA RARE (A) NONE SEEN   Squamous Epithelial / LPF NONE SEEN NONE SEEN   Budding Yeast PRESENT    Ca Oxalate Crys, UA PRESENT   Glucose, capillary     Status: Abnormal   Collection Time: 02/21/17 11:48 AM  Result Value Ref Range   Glucose-Capillary 140 (H) 65 - 99 mg/dL    US Renal  Result Date: 02/20/2017 CLINICAL DATA:  81 y/o  F; acute renal failure. EXAM: RENAL / URINARY TRACT ULTRASOUND  COMPLETE COMPARISON:  02/15/2017 FINDINGS: Right Kidney: Length: 12.1 cm. Normal echogenicity. No mass. Mild right hydronephrosis. Left Kidney: Length: 13.0 cm. Lower pole echogenic foci measuring up to 13 mm compatible with nephrolithiasis. No hydronephrosis. Bladder: Appears normal for degree of bladder distention. IMPRESSION: 1. Mild right hydronephrosis. 2. Left kidney lower pole nephrolithiasis. Electronically Signed   By: Kristine Garbe M.D.   On: 02/20/2017 17:21    Review of Systems  Unable to perform ROS: Intubated   Blood pressure (!) 155/89, pulse (!) 111, temperature 98.3 F (36.8 C), temperature source Axillary, resp. rate (!) 28, height '5\' 5"'$  (1.651 m), weight 84.7 kg (186 lb 11.7 oz), SpO2 98 %. Physical Exam  Constitutional:  Ill appearing in ICU with daughter and and adult grandchild at bedside.   HENT:  Head: Normocephalic.  ETT in place on vent.   Cardiovascular:  Regular tacycardia by bedside monitor  Respiratory:  Mild coarse breath sounds on vent.   GI: Soft.  Genitourinary:  Genitourinary Comments: Foley in place.   Musculoskeletal: Normal range of motion.  Neurological:  GCS 3T off all sedation.   Skin: Skin is warm.    Assessment/Plan:  1 - Acute Renal Failure - likley combination pre/post renal with some partial rihgt ureteral obstruction from stone.   2 -  Rt Ureteral Stone - pt  NOT surgical candidate at present. Her prognosis is guarded. Discussed maximally aggressive path of IR neph tube placement on right which may improve renal function, allow decompression form stone, and faster clearance of any obstructing infection v. Palliative path.  At this point they are still meeting with teams including palliative care and are not ready to make final decision.   3 - Urosepsis - agree with current ABX based on CX data, appreciate ID input. Possible Rt renal drainage as per above.  If pt desires maximally aggressive care, then rec IR rt neph tube.  Plan discussed with critical care team.   [facility], Jeromy Borcherding 02/21/2017, 1:11 PM

## 2017-02-21 NOTE — Consult Note (Signed)
Consultation Note Date: 02/21/2017   Patient Name: Melissa Cochran  DOB: 1936-01-17  MRN: [id number]  Age / Sex: 81 y.o., female  PCP: Teodoro Spray, MD Referring Physician: Hillary Bow, MD  Reason for Consultation: Establishing goals of care  HPI/Patient Profile: 81 y.o. female with past medical history of heart failure, heart block, and COPD who was admitted on 02/13/2017.  She was unresponsive.  Initial work up revealed status epilepticus.  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  Since admission the patient has developed renal failure and has minimal urine output.  Clinical Assessment and Goals of Care:  I have reviewed medical records including EPIC notes, labs and imaging, received report from the bedside RN, CCM and neuro, assessed the patient and then met at the bedside along with her daughter [name] and her grandson Patsy [place]  to discuss diagnosis prognosis, GOC, EOL wishes, disposition and options.  I introduced Palliative Medicine as specialized medical care for people living with serious illness. It focuses on providing relief from the symptoms and stress of a serious illness. The goal is to improve quality of life for both the patient and the family.  We discussed a brief life review of the patient. She worked her entire life.  She started working in [employer] at age 36.  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  She has 1 daughter, [name].    As far as functional and nutritional status, the patient was able to walk on her walker from room to room.  She was able to drive and was independent with her ADLs.    We discussed their current illness and what it means in the larger context of their on-going co-morbidities.  Natural disease trajectory and expectations at EOL were discussed.  The family is very much in shock and is unable to  accept the patient's grave prognosis.  We discussed her prolonged seizure, HSV encephalitis and kidney failure.  We discussed next steps - 1.  Liberating her from the vent, 2 - determining if she will wake up and be able to eat.   We discussed what would be her minimal acceptable quality of life.  The family admitted they had never discussed that with her but they were ok with her not being able to walk or talk.  [name] expressed that she would only let her mother go if she felt like she was suffering.     I attempted to elicit values and goals of care important to the patient.  The difference between aggressive medical intervention and comfort care was considered in light of the patient's goals of care. At this point the family wants full scope treatment including re-intubation.  Questions and concerns were addressed. The family was encouraged to call with questions or concerns.  We agreed to talk again on Wednesday.   Primary Decision Maker:  NEXT OF KIN Dtr [name].    SUMMARY OF RECOMMENDATIONS    Code Status/Advance Care Planning:  Full  code  Psycho-social/Spiritual:   Desire for further Chaplaincy support: yes  Prognosis:   Very poor prognosis given status epilepticus, encephalitis, and renal failure.  Discharge Planning: To Be Determined.  Likely SNF with Palliative to follow if she is able to tolerate a diet.      Primary Diagnoses: Present on Admission: . Convulsive status epilepticus ([place])   I have reviewed the medical record, interviewed the patient and family, and examined the patient. The following aspects are pertinent.  Past Medical History:  Diagnosis Date  . Adnexal mass   . Anxiety   . Arthritis   . Bladder tumor   . Bleeding ulcer   . CHF (congestive heart failure) ([place])   . CHF (congestive heart failure) ([place])   . COPD (chronic obstructive pulmonary disease) ([place])   . Coronary artery disease   . GERD (gastroesophageal reflux disease)   . Heart block     left  . History of bleeding ulcers   . Hyperlipemia   . Hypertension   . Pneumonia   . Shortness of breath dyspnea    Social History   Social History  . Marital status: Widowed    Spouse name: N/A  . Number of children: N/A  . Years of education: N/A   Social History Main Topics  . Smoking status: Former Smoker    Packs/day: 1.00    Years: 40.00    Quit date: 06/13/1998  . Smokeless tobacco: Never Used  . Alcohol use No  . Drug use: No  . Sexual activity: Not Currently   Other Topics Concern  . None   Social History Narrative  . None   Family History  Problem Relation Age of Onset  . Stomach cancer Mother   . CVA Father   . Bladder Cancer Neg Hx   . Kidney cancer Neg Hx    Scheduled Meds: . amiodarone  150 mg Intravenous Once  . chlorhexidine gluconate (MEDLINE KIT)  15 mL Mouth Rinse BID  . feeding supplement (PRO-STAT SUGAR FREE 64)  30 mL Per Tube BID  . feeding supplement (VITAL 1.5 CAL)  1,000 mL Per Tube Q24H  . free water  175 mL Per Tube Q6H  . mouth rinse  15 mL Mouth Rinse 10 times per day  . multivitamin  15 mL Per Tube Daily  . pantoprazole (PROTONIX) IV  40 mg Intravenous Q24H  . senna-docusate  1 tablet Per Tube BID  . sodium chloride flush  10-40 mL Intracatheter Q12H   Continuous Infusions: . sodium chloride 75 mL/hr at 02/21/17 0936  . acyclovir Stopped (02/21/17 0558)  . amiodarone 30 mg/hr (02/21/17 0900)  . levETIRAcetam Stopped (02/21/17 0058)  . meropenem (MERREM) IV 1 g (02/21/17 0931)  . midazolam (VERSED) infusion Stopped (02/14/17 0800)   PRN Meds:.acetaminophen **OR** acetaminophen, albuterol, fentaNYL (SUBLIMAZE) injection, hydrALAZINE, metoprolol tartrate, ondansetron **OR** ondansetron (ZOFRAN) IV, sodium chloride flush No Known Allergies Review of Systems intubated  Physical Exam  Elderly female intubated and non responsive. CV tachy resp no distress.  Intubated. Extremities with 2+ edema, arms weeping.  Vital Signs:  BP (!) 155/89 (BP Location: Right Leg)   Pulse (!) 111   Temp 98.5 F (36.9 C) (Axillary)   Resp (!) 28   Ht 5' 5" (1.651 m)   Wt 84.7 kg (186 lb 11.7 oz)   SpO2 98%   BMI 31.07 kg/m  Pain Assessment: CPOT     SpO2: SpO2: 98 % O2 Device:SpO2: 98 % O2 Flow  Rate: .   IO: Intake/output summary:  Intake/Output Summary (Last 24 hours) at 02/21/17 1023 Last data filed at 02/21/17 0931  Gross per 24 hour  Intake           3443.9 ml  Output              445 ml  Net           2998.9 ml    LBM: Last BM Date: 02/20/17 Baseline Weight: Weight: 68 kg (150 lb) Most recent weight: Weight: 84.7 kg (186 lb 11.7 oz)     Palliative Assessment/Data: 10%     Time In: 10:00 Time Out: 11:10 Time Total: 70 min. Greater than 50%  of this time was spent counseling and coordinating care related to the above assessment and plan.  Signed by: Florentina Jenny, PA-C Palliative Medicine Pager: [phone]  Please contact Palliative Medicine Team phone at [phone] for questions and concerns.  For individual provider: See Shea Evans

## 2017-02-21 NOTE — Progress Notes (Signed)
Nutrition Follow-up  DOCUMENTATION CODES:   Not applicable  INTERVENTION:  Continue Vital 1.5 at 40 ml/hr + Pro-Stat 30 ml BID. Provides 1640 kcal, 95 grams of protein, 739 ml H2O daily.  Continue free water flush per MD. Currently ordered for 100 ml Q8hrs. Provides 1039 ml H2O daily including water in TF.  Continue liquid multivitamin daily per tube as goal TF regimen does not meet 100% RDIs for vitamins/minerals.  Recommend discontinuing order for senna. Patient having liquid bowel movements into rectal tube.  NUTRITION DIAGNOSIS:   Inadequate oral intake related to inability to eat as evidenced by NPO status.  Ongoing - addressing with TF.  GOAL:   Provide needs based on ASPEN/SCCM guidelines  Met with TF.  MONITOR:   Vent status, Labs, Weight trends, TF tolerance, I & O's, Skin  REASON FOR ASSESSMENT:   Ventilator    ASSESSMENT:   81 year old female with PMHx of HTN, HLD, heart block, adnexal mass, bleeding ulcer, CHF, CAD, COPD, GERD, anxiety who presented with unresponsiveness and status epilepticus and was intubated on 10/7 for airway protection.  -PMT following patient and had meeting with family today. At this time they would like full code and full scope of treatment. -Per nephrology note there is concern for ATN and acyclovir nephrotoxicity. -Patient continues to be encephalopathic.  Patient remains intubated. No family members at bedside during assessment today.  Access: 18 Fr. OGT placed 10/7; verified to terminate in gastric body per abdominal x-ray 10/7; 55 cm at corner of mouth   MAP: 89-113 mmHg  TF: patient continues to tolerate Vital 1.5 at 40 ml/hr + Pro-Stat 30 ml BID; per pump history patient has received 911 ml of Vital 1.5 in the past 24 hrs (95% goal TF volume); free water flush order was changed to 100 ml Q8hrs  Patient is currently intubated on ventilator support MV: 14.9 L/min Temp (24hrs), Avg:98.6 F (37 C), Min:98.3 F (36.8 C),  Max:98.9 F (37.2 C)  Propofol: N/A  Medications reviewed and include: pantoprazole, senna, acyclovir, Keppra, meropenem.  Labs reviewed: CBG 129-140, Chloride 99, BUN 88, Creatinine 2.34, eGFR 19.  I/O: only 10 ml UOP yesterday; 300 ml output of stool  Weight trend: 84.7 kg on 10/15; +16.7 kg from admission  Discussed with RN. Patient also discussed on rounds.  Diet Order:     Skin:  Reviewed, no issues  Last BM:  02/21/2017 - medium type 7 per rectal tube  Height:   Ht Readings from Last 1 Encounters:  02/17/17 '5\' 5"'$  (1.651 m)    Weight:   Wt Readings from Last 1 Encounters:  02/21/17 186 lb 11.7 oz (84.7 kg)    Ideal Body Weight:  56.8 kg  BMI:  Body mass index is 31.07 kg/m.  Estimated Nutritional Needs:   Kcal:  1682 (PSU 2003b w/ MSJ 1162, Ve 7.5, Tmax 39.2)  Protein:  82-95 grams (1.2-1.4 grams/kg)  Fluid:  1.7 L/day (25 ml/kg)  EDUCATION NEEDS:   No education needs identified at this time  Willey Blade, [place], [place], LDN Office: [phone] Pager: [phone] After Hours/Weekend Pager: [phone]

## 2017-02-22 ENCOUNTER — Inpatient Hospital Stay
Admission: RE | Admit: 2017-02-22 | Discharge: 2017-03-02 | Disposition: A | Discharge status: Other Acute Inpatient Hospital | Payer: Medicare Other | Source: Other Acute Inpatient Hospital | Attending: Urology | Admitting: Urology

## 2017-02-22 ENCOUNTER — Other Ambulatory Visit (HOSPITAL_COMMUNITY): Payer: Medicare Other

## 2017-02-22 ENCOUNTER — Ambulatory Visit (HOSPITAL_COMMUNITY)
Admission: AD | Admit: 2017-02-22 | Discharge: 2017-02-22 | Disposition: A | Discharge status: Home / Self Care | Payer: Medicare Other | Source: Other Acute Inpatient Hospital | Attending: Internal Medicine | Admitting: Internal Medicine

## 2017-02-22 ENCOUNTER — Inpatient Hospital Stay: Payer: Medicare Other

## 2017-02-22 DIAGNOSIS — I82409 Acute embolism and thrombosis of unspecified deep veins of unspecified lower extremity: Secondary | ICD-10-CM | POA: Diagnosis not present

## 2017-02-22 DIAGNOSIS — Z4659 Encounter for fitting and adjustment of other gastrointestinal appliance and device: Secondary | ICD-10-CM

## 2017-02-22 DIAGNOSIS — R899 Unspecified abnormal finding in specimens from other organs, systems and tissues: Secondary | ICD-10-CM | POA: Diagnosis not present

## 2017-02-22 DIAGNOSIS — E785 Hyperlipidemia, unspecified: Secondary | ICD-10-CM | POA: Diagnosis present

## 2017-02-22 DIAGNOSIS — G931 Anoxic brain damage, not elsewhere classified: Secondary | ICD-10-CM | POA: Diagnosis not present

## 2017-02-22 DIAGNOSIS — I480 Paroxysmal atrial fibrillation: Secondary | ICD-10-CM | POA: Diagnosis present

## 2017-02-22 DIAGNOSIS — Z87891 Personal history of nicotine dependence: Secondary | ICD-10-CM | POA: Diagnosis not present

## 2017-02-22 DIAGNOSIS — Z4682 Encounter for fitting and adjustment of non-vascular catheter: Secondary | ICD-10-CM | POA: Diagnosis not present

## 2017-02-22 DIAGNOSIS — N179 Acute kidney failure, unspecified: Secondary | ICD-10-CM | POA: Diagnosis not present

## 2017-02-22 DIAGNOSIS — Z6832 Body mass index (BMI) 32.0-32.9, adult: Secondary | ICD-10-CM | POA: Diagnosis not present

## 2017-02-22 DIAGNOSIS — R402433 Glasgow coma scale score 3-8, at hospital admission: Secondary | ICD-10-CM | POA: Diagnosis not present

## 2017-02-22 DIAGNOSIS — R7989 Other specified abnormal findings of blood chemistry: Secondary | ICD-10-CM

## 2017-02-22 DIAGNOSIS — I509 Heart failure, unspecified: Secondary | ICD-10-CM | POA: Diagnosis not present

## 2017-02-22 DIAGNOSIS — R748 Abnormal levels of other serum enzymes: Secondary | ICD-10-CM | POA: Diagnosis not present

## 2017-02-22 DIAGNOSIS — Z79899 Other long term (current) drug therapy: Secondary | ICD-10-CM | POA: Diagnosis not present

## 2017-02-22 DIAGNOSIS — J9600 Acute respiratory failure, unspecified whether with hypoxia or hypercapnia: Secondary | ICD-10-CM | POA: Diagnosis not present

## 2017-02-22 DIAGNOSIS — R69 Illness, unspecified: Secondary | ICD-10-CM | POA: Diagnosis not present

## 2017-02-22 DIAGNOSIS — R633 Feeding difficulties: Secondary | ICD-10-CM | POA: Diagnosis not present

## 2017-02-22 DIAGNOSIS — R262 Difficulty in walking, not elsewhere classified: Secondary | ICD-10-CM | POA: Diagnosis not present

## 2017-02-22 DIAGNOSIS — N390 Urinary tract infection, site not specified: Secondary | ICD-10-CM | POA: Diagnosis not present

## 2017-02-22 DIAGNOSIS — Z9911 Dependence on respirator [ventilator] status: Secondary | ICD-10-CM | POA: Insufficient documentation

## 2017-02-22 DIAGNOSIS — J9601 Acute respiratory failure with hypoxia: Secondary | ICD-10-CM | POA: Diagnosis not present

## 2017-02-22 DIAGNOSIS — Z0489 Encounter for examination and observation for other specified reasons: Secondary | ICD-10-CM | POA: Diagnosis not present

## 2017-02-22 DIAGNOSIS — I110 Hypertensive heart disease with heart failure: Secondary | ICD-10-CM | POA: Diagnosis not present

## 2017-02-22 DIAGNOSIS — J9690 Respiratory failure, unspecified, unspecified whether with hypoxia or hypercapnia: Secondary | ICD-10-CM | POA: Diagnosis not present

## 2017-02-22 DIAGNOSIS — G40901 Epilepsy, unspecified, not intractable, with status epilepticus: Secondary | ICD-10-CM | POA: Diagnosis not present

## 2017-02-22 DIAGNOSIS — N201 Calculus of ureter: Secondary | ICD-10-CM | POA: Diagnosis not present

## 2017-02-22 DIAGNOSIS — J9621 Acute and chronic respiratory failure with hypoxia: Secondary | ICD-10-CM | POA: Diagnosis not present

## 2017-02-22 DIAGNOSIS — Z720 Tobacco use: Secondary | ICD-10-CM | POA: Diagnosis not present

## 2017-02-22 DIAGNOSIS — N200 Calculus of kidney: Secondary | ICD-10-CM | POA: Diagnosis not present

## 2017-02-22 DIAGNOSIS — Z9289 Personal history of other medical treatment: Secondary | ICD-10-CM

## 2017-02-22 DIAGNOSIS — R1310 Dysphagia, unspecified: Secondary | ICD-10-CM | POA: Diagnosis not present

## 2017-02-22 DIAGNOSIS — K219 Gastro-esophageal reflux disease without esophagitis: Secondary | ICD-10-CM | POA: Diagnosis present

## 2017-02-22 DIAGNOSIS — B004 Herpesviral encephalitis: Secondary | ICD-10-CM | POA: Diagnosis not present

## 2017-02-22 DIAGNOSIS — M6281 Muscle weakness (generalized): Secondary | ICD-10-CM | POA: Diagnosis not present

## 2017-02-22 DIAGNOSIS — R319 Hematuria, unspecified: Secondary | ICD-10-CM | POA: Diagnosis not present

## 2017-02-22 DIAGNOSIS — J189 Pneumonia, unspecified organism: Secondary | ICD-10-CM | POA: Diagnosis not present

## 2017-02-22 DIAGNOSIS — Z95828 Presence of other vascular implants and grafts: Secondary | ICD-10-CM

## 2017-02-22 DIAGNOSIS — A419 Sepsis, unspecified organism: Secondary | ICD-10-CM | POA: Diagnosis not present

## 2017-02-22 DIAGNOSIS — J449 Chronic obstructive pulmonary disease, unspecified: Secondary | ICD-10-CM | POA: Diagnosis not present

## 2017-02-22 DIAGNOSIS — E876 Hypokalemia: Secondary | ICD-10-CM | POA: Diagnosis not present

## 2017-02-22 DIAGNOSIS — I5040 Unspecified combined systolic (congestive) and diastolic (congestive) heart failure: Secondary | ICD-10-CM | POA: Diagnosis not present

## 2017-02-22 DIAGNOSIS — I2510 Atherosclerotic heart disease of native coronary artery without angina pectoris: Secondary | ICD-10-CM | POA: Diagnosis not present

## 2017-02-22 DIAGNOSIS — E46 Unspecified protein-calorie malnutrition: Secondary | ICD-10-CM | POA: Diagnosis present

## 2017-02-22 DIAGNOSIS — G0490 Encephalitis and encephalomyelitis, unspecified: Secondary | ICD-10-CM | POA: Diagnosis not present

## 2017-02-22 DIAGNOSIS — J9620 Acute and chronic respiratory failure, unspecified whether with hypoxia or hypercapnia: Secondary | ICD-10-CM | POA: Diagnosis not present

## 2017-02-22 DIAGNOSIS — Z8551 Personal history of malignant neoplasm of bladder: Secondary | ICD-10-CM | POA: Diagnosis not present

## 2017-02-22 DIAGNOSIS — R41841 Cognitive communication deficit: Secondary | ICD-10-CM | POA: Diagnosis not present

## 2017-02-22 DIAGNOSIS — Z515 Encounter for palliative care: Secondary | ICD-10-CM | POA: Diagnosis not present

## 2017-02-22 DIAGNOSIS — G40919 Epilepsy, unspecified, intractable, without status epilepticus: Secondary | ICD-10-CM | POA: Diagnosis not present

## 2017-02-22 LAB — PROTEIN ELECTROPHORESIS, SERUM
A/G Ratio: 0.5 — ABNORMAL LOW (ref 0.7–1.7)
ALPHA-1-GLOBULIN: 0.5 g/dL — AB (ref 0.0–0.4)
ALPHA-2-GLOBULIN: 1 g/dL (ref 0.4–1.0)
Albumin ELP: 1.4 g/dL — ABNORMAL LOW (ref 2.9–4.4)
BETA GLOBULIN: 0.9 g/dL (ref 0.7–1.3)
Gamma Globulin: 0.3 g/dL — ABNORMAL LOW (ref 0.4–1.8)
Globulin, Total: 2.7 g/dL (ref 2.2–3.9)
M-SPIKE, %: 0.1 g/dL — AB
Total Protein ELP: 4.1 g/dL — ABNORMAL LOW (ref 6.0–8.5)

## 2017-02-22 LAB — ENA+DNA/DS+ANTICH+CENTRO+JO...
Centromere Ab Screen: <0.2 AI (ref 0.0–0.9)
DS DNA AB: 1 [IU]/mL (ref 0–9)
Ribonucleic Protein: 1.3 AI — ABNORMAL HIGH (ref 0.0–0.9)
SSA (Ro) (ENA) Antibody, IgG: <0.2 AI (ref 0.0–0.9)
SSB (La) (ENA) Antibody, IgG: <0.2 AI (ref 0.0–0.9)

## 2017-02-22 LAB — GLUCOSE, CAPILLARY
GLUCOSE-CAPILLARY: 107 mg/dL — AB (ref 65–99)
GLUCOSE-CAPILLARY: 119 mg/dL — AB (ref 65–99)
Glucose-Capillary: 110 mg/dL — ABNORMAL HIGH (ref 65–99)

## 2017-02-22 LAB — CBC
HEMATOCRIT: 27.9 % — AB (ref 35.0–47.0)
HEMOGLOBIN: 8.9 g/dL — AB (ref 12.0–16.0)
MCH: 28.8 pg (ref 26.0–34.0)
MCHC: 31.9 g/dL — ABNORMAL LOW (ref 32.0–36.0)
MCV: 90.1 fL (ref 80.0–100.0)
PLATELETS: 284 10*3/uL (ref 150–440)
RBC: 3.09 MIL/uL — AB (ref 3.80–5.20)
RDW: 16 % — ABNORMAL HIGH (ref 11.5–14.5)
WBC: 17.1 10*3/uL — AB (ref 3.6–11.0)

## 2017-02-22 LAB — BASIC METABOLIC PANEL
ANION GAP: 10 (ref 5–15)
BUN: 99 mg/dL — ABNORMAL HIGH (ref 6–20)
CHLORIDE: 101 mmol/L (ref 101–111)
CO2: 23 mmol/L (ref 22–32)
Calcium: 8.2 mg/dL — ABNORMAL LOW (ref 8.9–10.3)
Creatinine, Ser: 2.57 mg/dL — ABNORMAL HIGH (ref 0.44–1.00)
GFR calc Af Amer: 19 mL/min — ABNORMAL LOW (ref >60)
GFR calc non Af Amer: 17 mL/min — ABNORMAL LOW (ref >60)
Glucose, Bld: 108 mg/dL — ABNORMAL HIGH (ref 65–99)
POTASSIUM: 4.1 mmol/L (ref 3.5–5.1)
Sodium: 134 mmol/L — ABNORMAL LOW (ref 135–145)

## 2017-02-22 LAB — ANA W/REFLEX IF POSITIVE: ANA: POSITIVE — AB

## 2017-02-22 LAB — C4 COMPLEMENT: Complement C4, Body Fluid: 31 mg/dL (ref 14–44)

## 2017-02-22 LAB — MPO/PR-3 (ANCA) ANTIBODIES: ANCA Proteinase 3: <3.5 U/mL (ref 0.0–3.5)

## 2017-02-22 LAB — VARICELLA-ZOSTER BY PCR: VARICELLA-ZOSTER, PCR: NEGATIVE

## 2017-02-22 LAB — VALPROIC ACID LEVEL: Valproic Acid Lvl: <10 ug/mL — ABNORMAL LOW (ref 50.0–100.0)

## 2017-02-22 LAB — C3 COMPLEMENT: C3 COMPLEMENT: 165 mg/dL (ref 82–167)

## 2017-02-22 MED ORDER — SODIUM CHLORIDE 0.9 % IV SOLN: 1.0000 g | Freq: Two times a day (BID) | INTRAVENOUS | Status: DC

## 2017-02-22 MED ORDER — ADULT MULTIVITAMIN LIQUID CH: 15.0000 mL | Freq: Every day | ORAL | Status: DC

## 2017-02-22 MED ORDER — HYDRALAZINE HCL 20 MG/ML IJ SOLN: 10.0000 mg | Freq: Four times a day (QID) | INTRAMUSCULAR | Status: DC | PRN

## 2017-02-22 MED ORDER — VALPROATE SODIUM 500 MG/5ML IV SOLN: 1000.0000 mg | Freq: Three times a day (TID) | INTRAVENOUS | Status: DC

## 2017-02-22 MED ORDER — VITAL 1.5 CAL PO LIQD: 1000.0000 mL | ORAL | Status: DC

## 2017-02-22 MED ORDER — ACETAMINOPHEN 325 MG PO TABS: 650.0000 mg | ORAL_TABLET | Freq: Four times a day (QID) | ORAL | Status: DC | PRN

## 2017-02-22 MED ORDER — SODIUM CHLORIDE 0.9 % IV SOLN: 500.0000 mg | Freq: Two times a day (BID) | INTRAVENOUS | Status: DC

## 2017-02-22 MED ORDER — DEXTROSE 5 % IV SOLN: 650.0000 mg | INTRAVENOUS | Status: DC

## 2017-02-22 MED ORDER — ORAL CARE MOUTH RINSE: 15.0000 mL | OROMUCOSAL | 0 refills | Status: DC | PRN

## 2017-02-22 MED ORDER — CHLORHEXIDINE GLUCONATE 0.12% ORAL RINSE (MEDLINE KIT): 15.0000 mL | Freq: Two times a day (BID) | OROMUCOSAL | 0 refills | Status: DC

## 2017-02-22 MED ORDER — METOPROLOL TARTRATE 5 MG/5ML IV SOLN: 2.5000 mg | Freq: Four times a day (QID) | INTRAVENOUS | Status: DC | PRN

## 2017-02-22 MED ORDER — FREE WATER: 100.0000 mL | Freq: Three times a day (TID) | Status: DC

## 2017-02-22 MED ORDER — PRO-STAT SUGAR FREE PO LIQD: 30.0000 mL | Freq: Two times a day (BID) | ORAL | 0 refills | Status: DC

## 2017-02-22 MED ORDER — ACETAMINOPHEN 650 MG RE SUPP: 650.0000 mg | Freq: Four times a day (QID) | RECTAL | 0 refills | Status: DC | PRN

## 2017-02-22 MED ORDER — VALPROATE SODIUM 500 MG/5ML IV SOLN
1000.0000 mg | Freq: Three times a day (TID) | INTRAVENOUS | Status: DC
  Filled 2017-02-22 (×3): qty 10

## 2017-02-22 MED ORDER — PANTOPRAZOLE SODIUM 40 MG PO PACK: 40.0000 mg | PACK | Freq: Every day | ORAL | Status: DC

## 2017-02-22 MED ORDER — ONDANSETRON HCL 4 MG/2ML IJ SOLN: 4.0000 mg | Freq: Four times a day (QID) | INTRAMUSCULAR | 0 refills | Status: DC | PRN

## 2017-02-22 MED ORDER — SENNOSIDES-DOCUSATE SODIUM 8.6-50 MG PO TABS: 1.0000 | ORAL_TABLET | Freq: Two times a day (BID) | ORAL | Status: DC

## 2017-02-22 MED ORDER — ALBUTEROL SULFATE (2.5 MG/3ML) 0.083% IN NEBU: 2.5000 mg | INHALATION_SOLUTION | RESPIRATORY_TRACT | 12 refills | Status: DC | PRN

## 2017-02-22 NOTE — Care Management (Signed)
Palliative consult pending. I have requested LTAC screening in case family wishes to pursue treatment after talking with Palliative team.  [facility].

## 2017-02-22 NOTE — Progress Notes (Signed)
Report called to [facility] at [facility]. Report called to [facility].

## 2017-02-22 NOTE — Progress Notes (Signed)
Daily Progress Note   Patient Name: DEEANNA Cochran       Date: 02/22/2017 DOB: 1935-12-01  Age: 81 y.o. MRN#: [id number] Attending Physician: Hillary Bow, MD Primary Care Physician: Teodoro Spray, MD Admit Date: 02/13/2017  Reason for Consultation/Follow-up: Establishing goals of care  Subjective: Spoke with daughter and great grand son at bedside.  Family is overwhelmed by the tragedy of what has happened to Melissa Cochran.  They are willing to accept any level of recovery.  Melissa Cochran (dtr) is defensive - the patients prognosis for meaningful recovery is very grim, but Melissa Cochran is not prepared to accept that at this point.    We discussed LTAC as a place where Melissa Cochran could continue vent support and have more time to determine what function she may recover.   Assessment: Patient is vented, left arm with small continuous jerking motion, eyes closed but facial expression continuously changed.  The patient is unable to follow commands or respond in any meaningful way.  I am concerned given her tachycardia, respiratory rate, and facial expressions that she is in pain/uncomfortable.   Patient Profile/HPI:  81 y.o. female with past medical history of heart failure, heart block, and COPD who was admitted on 02/13/2017.  She was unresponsive.  Initial work up revealed status epilepticus.  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  MRI revealed multiple strokes. Since admission the patient has developed renal failure although urine output has improved over the last 24 hours.    Length of Stay: 9  Current Medications: Scheduled Meds:  . chlorhexidine gluconate (MEDLINE KIT)  15 mL Mouth Rinse BID  . feeding supplement (PRO-STAT SUGAR FREE 64)  30 mL Per Tube BID  . feeding supplement  (VITAL 1.5 CAL)  1,000 mL Per Tube Q24H  . free water  100 mL Per Tube Q8H  . mouth rinse  15 mL Mouth Rinse 10 times per day  . multivitamin  15 mL Per Tube Daily  . pantoprazole sodium  40 mg Per Tube Q1200  . senna-docusate  1 tablet Per Tube BID  . sodium chloride flush  10-40 mL Intracatheter Q12H    Continuous Infusions: . acyclovir 650 mg (02/22/17 0543)  . levETIRAcetam 500 mg (02/22/17 1205)  . meropenem (MERREM) IV Stopped (02/22/17 1113)  . valproate sodium Stopped (02/22/17 1100)  PRN Meds: acetaminophen **OR** acetaminophen, albuterol, hydrALAZINE, metoprolol tartrate, [DISCONTINUED] ondansetron **OR** ondansetron (ZOFRAN) IV, sodium chloride flush  Physical Exam        Elderly female.  Unresponsive, intubated CV tachycardic resp lung sounds are coarse. Abdomen soft nt, nd  Vital Signs: BP 133/70 (BP Location: Right Leg)   Pulse (!) 124   Temp 98.4 F (36.9 C) (Axillary)   Resp (!) 27   Ht 5\' 5"  (1.651 m)   Wt 89.4 kg (197 lb 1.5 oz)   SpO2 100%   BMI 32.80 kg/m  SpO2: SpO2: 100 % O2 Device: O2 Device: Ventilator O2 Flow Rate: O2 Flow Rate (L/min): 28 L/min  Intake/output summary:  Intake/Output Summary (Last 24 hours) at 02/22/17 1306 Last data filed at 02/22/17 1205  Gross per 24 hour  Intake          1728.34 ml  Output             2570 ml  Net          -841.66 ml   LBM: Last BM Date: 02/21/17 Baseline Weight: Weight: 68 kg (150 lb) Most recent weight: Weight: 89.4 kg (197 lb 1.5 oz)       Palliative Assessment/Data: 10%      Patient Active Problem List   Diagnosis Date Noted  . Encephalitis   . Acute renal failure with tubular necrosis (HCC)   . Palliative care encounter   . Pressure injury of skin 02/17/2017  . Convulsive status epilepticus (HCC) 02/13/2017  . Bilateral lower extremity edema 10/08/2016  . Atherosclerosis of both lower extremities with bilateral ulceration of midfeet (HCC) 08/24/2016  . Venous ulcer of ankle (HCC)  07/08/2016  . HTN (hypertension), malignant 07/08/2016  . DVT (deep venous thrombosis) (HCC) 07/02/2016  . Swelling of limb 06/08/2016  . Pain in limb 06/08/2016  . Altered mental status 01/22/2016  . S/P coronary artery stent placement 10/30/2015  . Atherosclerotic heart disease of native coronary artery without angina pectoris 10/21/2015  . Preop cardiovascular exam 10/08/2015  . SOB (shortness of breath) on exertion 10/08/2015  . 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 Mass of lower lobe of left lung, incidetnal on CT 09/2015, smoker.  09/29/2015  . Other disorders of lung 09/29/2015  . Other microscopic hematuria 08/26/2015  . Kidney stone 08/26/2015  . Urge incontinence 08/26/2015  . Pneumonia 01/24/2015  . PNA (pneumonia) 01/24/2015  . Venous insufficiency of both lower extremities 12/09/2014  . Neuropathy 12/09/2014  . Essential (primary) hypertension 12/09/2014  . Polyneuropathy 12/09/2014  . Chronic venous insufficiency 12/09/2014  . Adnexal mass 11/28/2013  . History of cardiac catheterization 11/28/2013  . BP (high blood pressure) 11/28/2013  . Hyperlipidemia 11/28/2013  . Other specified postprocedural states 11/28/2013    Palliative Care Plan    Recommendations/Plan:  Am concerned patient is uncomfortable given facial expression, pulse rate, resp rate.  Would recommend low dose scheduled morphine, but will defer to CCM  It appears patient will likely go to [facility] today.  If patient remains in the hospital PMT will continue to follow and attempt to help family understand the patient's predicament.  Goals of Care and Additional Recommendations:  Limitations on Scope of Treatment: Full Scope Treatment  Code Status:  Full code  Prognosis: days to weeks  Discharge Planning:  To Be Determined  Care plan was discussed with family.  Thank you for allowing the Palliative Medicine Team to assist in the care of this patient.  Total time  spent:  35 min.     Greater than 50%  of this time was spent counseling and coordinating care related to the above assessment and plan.  Florentina Jenny, PA-C Palliative Medicine  Please contact Palliative MedicineTeam phone at [phone] for questions and concerns between 7 am - 7 pm.   Please see AMION for individual provider pager numbers.

## 2017-02-22 NOTE — Progress Notes (Signed)
Pt transported to [facility] by [facility]. Pt transported in stable condition. Family aware of transport and at bedside.

## 2017-02-22 NOTE — Progress Notes (Signed)
[facility]  ROUNDING NOTE   Subjective:   UOP 1620 - furosemide '40mg'$  IV x 1 yesterday  Creatinine 2.57 (2.34) (1.98)   Objective:  Vital signs in last 24 hours:  Temp:  [97.6 F (36.4 C)-98.6 F (37 C)] 98.5 F (36.9 C) (10/16 0730) Pulse Rate:  [84-120] 111 (10/16 0600) Resp:  [23-33] 32 (10/16 0600) BP: (92-194)/(58-107) 104/67 (10/16 0730) SpO2:  [89 %-100 %] 100 % (10/16 0600) FiO2 (%):  [28 %] 28 % (10/16 0834) Weight:  [89.4 kg (197 lb 1.5 oz)] 89.4 kg (197 lb 1.5 oz) (10/16 0500)  Weight change: 4.7 kg (10 lb 5.8 oz) Filed Weights   02/20/17 0500 02/21/17 0500 02/22/17 0500  Weight: 83.4 kg (183 lb 13.8 oz) 84.7 kg (186 lb 11.7 oz) 89.4 kg (197 lb 1.5 oz)    Intake/Output: I/O last 3 completed shifts: In: 4896.4 [I.V.:2122.7; NG/GT:1870.7; IV [id number]:683] Out: 2055 [Urine:1620; Emesis/NG output:10; Stool:425]   Intake/Output this shift:  Total I/O In: -  Out: 150 [Urine:150]  Physical Exam: General: Critically Ill  Head: +ETT  Eyes: Eyes closed  Neck: No JVD  Lungs:  Clear to auscultation, PRVC FiO2 28%  Heart: Regular rate and rhythm  Abdomen:  Soft, nontender  Extremities:  + peripheral edema.  Neurologic: Intubated, sedated  Skin: No lesions       Basic Metabolic Panel:  Recent Labs Lab 02/15/17 1735  02/16/17 0906 02/18/17 1620 02/19/17 0444 02/20/17 1039 02/21/17 0453 02/22/17 0507  NA  --   < > 136 135 137 135 135 134*  K  --   < > 3.8 3.5 3.6 3.8 4.0 4.1  CL  --   < > 98* 98* 100* 98* 99* 101  CO2  --   < > '28 28 29 24 22 23  '$ GLUCOSE  --   < > 146* 131* 157* 160* 127* 108*  BUN  --   < > 29* 36* 38* 75* 88* 99*  CREATININE  --   < > 0.73 0.65 0.74 1.98* 2.34* 2.57*  CALCIUM  --   < > 8.5* 8.4* 8.8* 8.9 8.3* 8.2*  MG 2.2  --  2.2  --  2.4  --   --   --   PHOS 2.1*  --  3.2  --  2.6  --   --   --   < > = values in this interval not displayed.  Liver Function Tests: No results for input(s): AST, ALT, ALKPHOS,  BILITOT, PROT, ALBUMIN in the last 168 hours. No results for input(s): LIPASE, AMYLASE in the last 168 hours. No results for input(s): AMMONIA in the last 168 hours.  CBC:  Recent Labs Lab 02/18/17 1620 02/22/17 0507  WBC 13.3* 17.1*  HGB 10.6* 8.9*  HCT 32.6* 27.9*  MCV 90.2 90.1  PLT 224 284    Cardiac Enzymes:  Recent Labs Lab 02/21/17 0453  CKTOTAL 5*    BNP: Invalid input(s): POCBNP  CBG:  Recent Labs Lab 02/21/17 1804 02/21/17 1941 02/21/17 2339 02/22/17 0339 02/22/17 0733  GLUCAP 106* 111* 125* 107* 110*    Microbiology: Results for orders placed or performed during the hospital encounter of 02/13/17  Blood Culture (routine x 2)     Status: None   Collection Time: 02/13/17  5:29 PM  Result Value Ref Range Status   Specimen Description BLOOD LEFT HAND  Final   Special Requests   Final    BOTTLES DRAWN AEROBIC AND ANAEROBIC Blood  Culture adequate volume   Culture NO GROWTH 5 DAYS  Final   Report Status 02/18/2017 FINAL  Final  Blood Culture (routine x 2)     Status: Abnormal   Collection Time: 02/13/17  5:29 PM  Result Value Ref Range Status   Specimen Description BLOOD RIGHT HAND  Final   Special Requests   Final    BOTTLES DRAWN AEROBIC AND ANAEROBIC Blood Culture adequate volume   Culture  Setup Time   Final    GRAM POSITIVE COCCI AEROBIC BOTTLE ONLY CRITICAL RESULT CALLED TO, READ BACK BY AND VERIFIED WITH: GARRETT COFFEE AT 1203 02/15/17 SDR    Culture (A)  Final    STAPHYLOCOCCUS SPECIES (COAGULASE NEGATIVE) THE SIGNIFICANCE OF ISOLATING THIS ORGANISM FROM A SINGLE SET OF BLOOD CULTURES WHEN MULTIPLE SETS ARE DRAWN IS UNCERTAIN. PLEASE NOTIFY THE MICROBIOLOGY DEPARTMENT WITHIN ONE WEEK IF SPECIATION AND SENSITIVITIES ARE REQUIRED. Performed at [facility]: [facility], [address], [place], [place] [address]    Report Status 02/17/2017 FINAL  Final  Urine culture     Status: Abnormal   Collection Time: 02/13/17  5:29 PM  Result Value Ref  Range Status   Specimen Description URINE, RANDOM  Final   Special Requests NONE  Final   Culture (A)  Final    >=100,000 COLONIES/mL ESCHERICHIA COLI Confirmed Extended Spectrum Beta-Lactamase Producer (ESBL).  In bloodstream infections from ESBL organisms, carbapenems are preferred over piperacillin/tazobactam. They are shown to have a lower risk of mortality. Performed at [facility], [address], [place], [place] [address]    Report Status 02/16/2017 FINAL  Final   Organism ID, Bacteria ESCHERICHIA COLI (A)  Final      Susceptibility   Escherichia coli - MIC*    AMPICILLIN >=32 RESISTANT Resistant     CEFAZOLIN >=64 RESISTANT Resistant     CEFTRIAXONE >=64 RESISTANT Resistant     CIPROFLOXACIN >=4 RESISTANT Resistant     GENTAMICIN <=1 SENSITIVE Sensitive     IMIPENEM <=0.25 SENSITIVE Sensitive     NITROFURANTOIN <=16 SENSITIVE Sensitive     TRIMETH/SULFA >=320 RESISTANT Resistant     AMPICILLIN/SULBACTAM >=32 RESISTANT Resistant     PIP/TAZO <=4 SENSITIVE Sensitive     Extended ESBL POSITIVE Resistant     * >=100,000 COLONIES/mL ESCHERICHIA COLI  Blood Culture ID Panel (Reflexed)     Status: None   Collection Time: 02/13/17  5:29 PM  Result Value Ref Range Status   Enterococcus species NOT DETECTED NOT DETECTED Final   Listeria monocytogenes NOT DETECTED NOT DETECTED Final   Staphylococcus species NOT DETECTED NOT DETECTED Final   Staphylococcus aureus NOT DETECTED NOT DETECTED Final   Streptococcus species NOT DETECTED NOT DETECTED Final   Streptococcus agalactiae NOT DETECTED NOT DETECTED Final   Streptococcus pneumoniae NOT DETECTED NOT DETECTED Final   Streptococcus pyogenes NOT DETECTED NOT DETECTED Final   Acinetobacter baumannii NOT DETECTED NOT DETECTED Final   Enterobacteriaceae species NOT DETECTED NOT DETECTED Final   Enterobacter cloacae complex NOT DETECTED NOT DETECTED Final   Escherichia coli NOT DETECTED NOT DETECTED Final   Klebsiella oxytoca NOT  DETECTED NOT DETECTED Final   Klebsiella pneumoniae NOT DETECTED NOT DETECTED Final   Proteus species NOT DETECTED NOT DETECTED Final   Serratia marcescens NOT DETECTED NOT DETECTED Final   Haemophilus influenzae NOT DETECTED NOT DETECTED Final   Neisseria meningitidis NOT DETECTED NOT DETECTED Final   Pseudomonas aeruginosa NOT DETECTED NOT DETECTED Final   Candida  albicans NOT DETECTED NOT DETECTED Final   Candida glabrata NOT DETECTED NOT DETECTED Final   Candida krusei NOT DETECTED NOT DETECTED Final   Candida parapsilosis NOT DETECTED NOT DETECTED Final   Candida tropicalis NOT DETECTED NOT DETECTED Final  MRSA PCR Screening     Status: None   Collection Time: 02/14/17  1:23 AM  Result Value Ref Range Status   MRSA by PCR NEGATIVE NEGATIVE Final    Comment:        The GeneXpert MRSA Assay (FDA approved for NASAL specimens only), is one component of a comprehensive MRSA colonization surveillance program. It is not intended to diagnose MRSA infection nor to guide or monitor treatment for MRSA infections.   CSF culture     Status: None   Collection Time: 02/15/17 12:49 PM  Result Value Ref Range Status   Specimen Description CSF  Final   Special Requests Normal  Final   Gram Stain   Final     MODERATE WBC SEEN NO ORGANISMS SEEN RARE RBC SEEN    Culture   Final    NO GROWTH 3 DAYS Performed at [facility], [place] [address] [place], [place] [address]    Report Status 02/19/2017 FINAL  Final  Culture, fungus without smear     Status: None (Preliminary result)   Collection Time: 02/15/17 12:49 PM  Result Value Ref Range Status   Specimen Description CSF  Final   Special Requests NONE  Final   Culture   Final    NO FUNGUS ISOLATED AFTER 3 DAYS Performed at [facility], [place] [address], [place], [place] [address]    Report Status PENDING  Incomplete  C difficile quick scan w PCR reflex     Status: None   Collection Time: 02/20/17 10:51 AM  Result Value  Ref Range Status   C Diff antigen NEGATIVE NEGATIVE Final   C Diff toxin NEGATIVE NEGATIVE Final   C Diff interpretation No C. difficile detected.  Final    Coagulation Studies: No results for input(s): LABPROT, INR in the last 72 hours.  Urinalysis:  Recent Labs  02/21/17 0921  COLORURINE YELLOW*  LABSPEC 1.018  PHURINE 5.0  GLUCOSEU NEGATIVE  HGBUR MODERATE*  BILIRUBINUR NEGATIVE  KETONESUR NEGATIVE  PROTEINUR 100*  NITRITE NEGATIVE  LEUKOCYTESUR NEGATIVE      Imaging: Ct Abdomen Pelvis Wo Contrast  Result Date: 02/21/2017 CLINICAL DATA:  Inpatient. Urosepsis. Right hydronephrosis at ultrasound. EXAM: CT ABDOMEN AND PELVIS WITHOUT CONTRAST TECHNIQUE: Multidetector CT imaging of the abdomen and pelvis was performed following the standard protocol without IV contrast. COMPARISON:  02/20/2017 renal sonogram. 07/08/2016 CT abdomen/ pelvis. FINDINGS: Limited motion degraded noncontrast scan. Lower chest: Small left and trace right dependent bilateral pleural effusions with associated mild dependent compressive atelectasis in both lower lobes, left greater than right. Coronary atherosclerosis. Hepatobiliary: Normal liver size. No discrete liver mass. Normal gallbladder with no radiopaque cholelithiasis. No biliary ductal dilatation. Pancreas: Normal, with no mass or duct dilation. Spleen: Normal size. No mass. Adrenals/Urinary Tract: Normal adrenals. There is mild to moderate bilateral hydroureteronephrosis to the level of the ureterovesical junctions bilaterally. No right renal stones. No right ureteral stones. Three nonobstructing lower left renal stones, largest 9 mm. Distal left pelvic ureteral 2 mm stone (series 3/ image 74), which does not appear to be obstructing. No additional left ureteral stones. No contour deforming renal masses. Bilateral perinephric edema and ill-defined fluid, left greater than right, extending into the anterior  and posterior paranephric retroperitoneal  spaces bilaterally. Moderately distended bladder despite the presence of an indwelling Foley catheter within the posterior bladder lumen. No bladder wall thickening. No bladder stones. Stomach/Bowel: Enteric tube terminates in the proximal stomach. Stable postsurgical changes from distal gastrectomy with intact appearing gastrojejunal anastomosis. Normal caliber small bowel with no small bowel wall thickening. Appendix not discretely visualized. No pericecal inflammatory changes. Mild sigmoid diverticulosis, with no large bowel wall thickening. Fluid levels throughout the large bowel. Rectal drainage catheter in place. Vascular/Lymphatic: Atherosclerotic abdominal aorta with stable ectatic 2.5 cm infrarenal abdominal aorta. No pathologically enlarged lymph nodes in the abdomen or pelvis. Reproductive: Status post hysterectomy, with no abnormal findings at the vaginal cuff. Cystic 3.9 x 2.5 cm right adnexal mass, previously 3.9 x 2.4 cm on 07/08/2016, not appreciably changed. Lobulated cystic multilocular 9.7 x 6.3 cm left adnexal mass (series 3/image 72) with thickened internal septations and thin mural calcification, previously 9.2 x 5.7 cm, mildly increased. Other: No pneumoperitoneum, ascites or focal fluid collection. Anasarca. Musculoskeletal: No aggressive appearing focal osseous lesions. Moderate thoracolumbar spondylosis, most prominent at L4-5, not appreciably changed. Partially visualized left total hip arthroplasty. IMPRESSION: 1. Mild-to-moderate bilateral hydroureteronephrosis to the level of the ureterovesical junctions bilaterally. Nonobstructing lower left renal stones. Punctate 2 mm left pelvic ureteral stone, which appears nonobstructing. No right renal or right ureteral stones. 2. Moderately distended bladder despite the presence of an indwelling Foley catheter in the bladder. Recommend clinical evaluation to exclude Foley dysfunction. 3. Small left and trace right bilateral dependent pleural  effusions with associated mild bibasilar atelectasis. Anasarca. 4. Complex bilateral cystic adnexal masses, stable on the right and mildly increased in size on the left, indeterminate for neoplasm. Recommend short-term outpatient gynecology consultation and transabdominal/transvaginal pelvic ultrasound or pelvic MRI without and with IV contrast correlation. 5. No evidence of bowel obstruction. Stable postsurgical changes in the stomach. Fluid levels throughout the large bowel, indicating a diarrheal/ malabsorptive state. 6. Aortic Atherosclerosis (ICD10-I70.0). Stable 2.5 cm ectatic infrarenal abdominal aorta, at risk for aneurysm development. Recommend follow-up aortic ultrasound in 5 years. This recommendation follows ACR consensus guidelines: White Paper of the ACR Incidental Findings Committee II on Vascular Findings. J Am Coll Radiol 2013; 10:789-794. Electronically Signed   By: Ilona Sorrel M.D.   On: 02/21/2017 14:57   US Renal  Result Date: 02/20/2017 CLINICAL DATA:  81 y/o  F; acute renal failure. EXAM: RENAL / URINARY TRACT ULTRASOUND COMPLETE COMPARISON:  02/15/2017 FINDINGS: Right Kidney: Length: 12.1 cm. Normal echogenicity. No mass. Mild right hydronephrosis. Left Kidney: Length: 13.0 cm. Lower pole echogenic foci measuring up to 13 mm compatible with nephrolithiasis. No hydronephrosis. Bladder: Appears normal for degree of bladder distention. IMPRESSION: 1. Mild right hydronephrosis. 2. Left kidney lower pole nephrolithiasis. Electronically Signed   By: Kristine Garbe M.D.   On: 02/20/2017 17:21   Dg Chest Port 1 View  Result Date: 02/22/2017 CLINICAL DATA:  81 year old female respiratory failure. Subsequent encounter. EXAM: PORTABLE CHEST 1 VIEW COMPARISON:  02/17/2017 chest x-ray. FINDINGS: Rotation to the left. Right PICC line tip proximal to mid superior vena cava. Endotracheal tube tip 4.2 cm above the carina. Nasogastric tube courses below the diaphragm. Tip is not included  on the present exam. Left-sided pleural effusion.  Left base atelectasis/ infiltrate. Mild cardiomegaly. Calcified slightly tortuous aorta. IMPRESSION: Left-sided pleural effusion with left lobe atelectasis versus infiltrate. Aortic Atherosclerosis (ICD10-I70.0). Electronically Signed   By: Genia Del M.D.   On: 02/22/2017 07:05  Medications:   . acyclovir 650 mg (02/22/17 0543)  . levETIRAcetam Stopped (02/21/17 2353)  . meropenem (MERREM) IV Stopped (02/21/17 2231)  . valproate sodium     . chlorhexidine gluconate (MEDLINE KIT)  15 mL Mouth Rinse BID  . feeding supplement (PRO-STAT SUGAR FREE 64)  30 mL Per Tube BID  . feeding supplement (VITAL 1.5 CAL)  1,000 mL Per Tube Q24H  . free water  100 mL Per Tube Q8H  . mouth rinse  15 mL Mouth Rinse 10 times per day  . multivitamin  15 mL Per Tube Daily  . pantoprazole sodium  40 mg Per Tube Q1200  . senna-docusate  1 tablet Per Tube BID  . sodium chloride flush  10-40 mL Intracatheter Q12H   acetaminophen **OR** acetaminophen, albuterol, hydrALAZINE, metoprolol tartrate, [DISCONTINUED] ondansetron **OR** ondansetron (ZOFRAN) IV, sodium chloride flush  Assessment/ Plan:  [name] is a 81 y.o. white female with adnexal mass, history of bladder tumor, congestive heart failure, COPD, coronary artery disease, GERD, hyperidemia, hypertension, who was admitted to [facility] on 02/13/2017\  1. Acute renal failure: nonoliguric urine output. Baseline creatinine of 0.74 on 10/13.  Urinalysis with hematuria, glucosuria and proteinuria. No IV contrast exposure Concern for ATN and acyclovir nephrotoxicity.  - IV furosemide '40mg'$  IV x 1 yesterday.  - Monitor volume status. No acute indication for dialysis.   2. Urinary tract infection and sepsis/bacteremia: 10/7 E. Coli - meropenem.    LOS: 9 Amandeep Nesmith 10/16/20189:57 AM

## 2017-02-22 NOTE — Discharge Summary (Signed)
Physician Discharge Summary  Patient ID: PEJA ALLENDER MRN: [id number] DOB/AGE: Feb 25, 1936 81 y.o.  Admit date: 02/13/2017 Discharge date: 02/22/2017  Admission Diagnoses: Convulsive Status epilepticus Acute hypoxic respiratory failure secondary to seizures Meningoencephalitis Paroxysmal atrial fibrillation NSTEMI Junctional bradycardia  Acute renal failure  Discharge Diagnoses:  Active Problems:   Convulsive status epilepticus ([place])   Pressure injury of skin   Encephalitis   Acute renal failure with tubular necrosis [facility])   Palliative care encounter   Acute renal failure (HCC)   Elevated troponin I level  Discharged Condition: serious  Hospital Course: This is a 81 year old Caucasian female with a past medical history as indicated below, no prior seizure history who presented to the emergency room via EMS after becoming unresponsive at home. History is obtained from ED records and from patient's family, as she is currently intubated and sedated. The patient's family, she was in her usual state of health all  Morning but in the afternoon and she complained of not feeling well.suddenly she started shaking and became unresponsive. When EMS arrived, patient was still having generalized tonic-clonic seizures. She was hypoxic with SPO2 in the 70s. She was placed on a nonrebreather mask and transferred to the emergency room. Total seizure time is estimated at about 20 minutes. She is being admitted to the ICU for further management. Her initial troponin is 1.95. CT head is negative for any acute bleed or infarct.  Patient is currently on a Versed drip with no further seizure activity. 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 Stable  right parietal meningioma 10/08 Cardiology consultation for elevated trop I (peak 15.17) 10/08 Neurology consultation 10/08 Echocardiogram:  10/08 EEG: abnormal electroencephalogram due to sharp activity over the right hemisphere with phase reversal at F8. This is consistent with the patient's presentation of seizures and suggests a focal disturbance over right hemisphere with epileptogenic potential 10/08 MRI brain: Multifocal acute abnormal signal in the brain. No associated hemorrhage, enhancement, or mass effect. Abnormal signal in the right mesial temporal lobe, right thalamus, and right cingulate gyrus is nonspecific and might be postictal, but consider Acute Herpes Encephalitis if the patient is febrile and recommend empiric antiviral treatment. Abnormal signal in the right occipital pole, posterior right parietal lobe, and both cerebellar hemispheres most resembles several acute infarcts. A chronic 2.3 cm posterior right frontal convexity meningioma with mild regional mass effect but no associated edema is probably clinically silent. Patchy bilateral cerebral white matter and left thalamic signal abnormality, nonspecific but may be related to chronic small vessel disease 10/09 ID consultation 10/09 CT L-spine: No acute or unexpected finding  10/09 LP - RBC 5894. WBC 165 with 99% segs 10/14 Nephrology consultation 10/14 US renal: Mild right hydronephrosis. Left kidney lower pole nephrolithiasis 10/15 CTAP: Mild-to-moderate bilateral hydroureteronephrosis to the level of the ureterovesical junctions bilaterally. Nonobstructing lower left renal stones. Punctate 2 mm left pelvic ureteral stone, which appears nonobstructing. No right renal or right ureteral stones. Moderately distended bladder despite the presence of an indwelling Foley catheter in the bladder. Recommend clinical evaluation to exclude Foley dysfunction. Small left and trace right bilateral dependent pleural effusions with associated  mild bibasilar atelectasis. Anasarca. Complex bilateral cystic adnexal masses, stable on the right and mildly increased in size on the left, indeterminate for  neoplasm 10/15 EEG:  10/15 Urology consultation for hydronephrosis 10/15 Palliative Care consultation 10/16 Patient accepted to [facility]  Consults: pulmonary/intensive care, ID and neurology  Significant Diagnostic Studies: microbiology: blood culture: positive for staph, sputum culture: negative and urine culture: positive and radiology: X-Ray: see imaging results above, MRI: see imaging results above and CT scan: see imaging results above  Treatments: See medication list   Discharge Exam: Blood pressure 108/64, pulse (!) 129, temperature 98.4 F (36.9 C), temperature source Axillary, resp. rate (!) 26, height _0  (1.651 m), weight 197 lb 1.5 oz (89.4 kg), SpO2 99 %.  General:  NAD Neuro:  Withdraws to noxious stimulus, pupils are reactive, positive corneal reflexes, positive gag reflex,no posturing HEENT: PERRLA, trachea midline, no JVD Cardiovascular: Sinus tachycardia, S1, S2, no murmur, regurg or gallop, +2 pulses in upper extremities, +1 in bilateral lower extremities,no edema  Lungs:  ETT, BILATERAL BREATH SOUNDS, DIMINISHED IN THE BASES, NO WHEEZING Abdomen:  Soft, nontender, nondistended, normal bowel sounds Musculoskeletal:  Positive range of motion, no deformities Skin:  Venostasis discoloration in bilateral lower extremities  Disposition: To [place] via ambulance  Allergies as of 02/22/2017   No Known Allergies     Medication List    STOP taking these medications   clopidogrel 75 MG tablet Commonly known as:  PLAVIX   docusate sodium 100 MG capsule Commonly known as:  COLACE   ELIQUIS 5 MG Tabs tablet Generic drug:  apixaban   metoprolol tartrate 100 MG tablet Commonly known as:  LOPRESSOR Replaced by:  metoprolol tartrate 5 MG/5ML Soln injection    traMADol 50 MG tablet Commonly known as:  ULTRAM     TAKE these medications   acetaminophen 325 MG tablet Commonly known as:  TYLENOL Take 2 tablets (650 mg total) by mouth every 6 (six) hours as needed for mild pain (or Fever >/= 101).   acetaminophen 650 MG suppository Commonly known as:  TYLENOL Place 1 suppository (650 mg total) rectally every 6 (six) hours as needed for mild pain (or Fever >/= 101).   acyclovir 650 mg in dextrose 5 % 100 mL Inject 650 mg into the vein daily.   albuterol (2.5 MG/3ML) 0.083% nebulizer solution Commonly known as:  PROVENTIL Take 3 mLs (2.5 mg total) by nebulization every 2 (two) hours as needed for wheezing.   chlorhexidine gluconate (MEDLINE KIT) 0.12 % solution Commonly known as:  PERIDEX 15 mLs by Mouth Rinse route 2 (two) times daily.   feeding supplement (PRO-STAT SUGAR FREE 64) Liqd Place 30 mLs into feeding tube 2 (two) times daily.   feeding supplement (VITAL 1.5 CAL) Liqd Place 1,000 mLs into feeding tube daily.   free water Soln Place 100 mLs into feeding tube every 8 (eight) hours.   hydrALAZINE 20 MG/ML injection Commonly known as:  APRESOLINE Inject 0.5 mLs (10 mg total) into the vein every 6 (six) hours as needed (SBP>180 or DBP>100).   levETIRAcetam 500 mg in sodium chloride 0.9 % 100 mL Inject 500 mg into the vein every 12 (twelve) hours.   meropenem 1 g in sodium chloride 0.9 % 100 mL Inject 1 g into the vein every 12 (twelve) hours.   metoprolol tartrate 5 MG/5ML Soln injection Commonly known as:  LOPRESSOR Inject 2.5-5 mLs (2.5-5 mg total) into the vein every 6 (six) hours as needed (for heart rate >120). Replaces:  metoprolol tartrate 100 MG tablet   mouth rinse Liqd solution 15 mLs by Mouth  Rinse route as needed.   multivitamin Liqd Place 15 mLs into feeding tube daily.   ondansetron 4 MG/2ML Soln injection Commonly known as:  ZOFRAN Inject 2 mLs (4 mg total) into the vein every 6 (six) hours as needed  for nausea.   pantoprazole sodium 40 mg/20 mL Pack Commonly known as:  PROTONIX Place 20 mLs (40 mg total) into feeding tube daily at 12 noon.   senna-docusate 8.6-50 MG tablet Commonly known as:  Senokot-S Place 1 tablet into feeding tube 2 (two) times daily.   valproate 1,000 mg in dextrose 5 % 50 mL Inject 1,000 mg into the vein every 8 (eight) hours.        Signed: [facility] ANP-BC Pulmonary and [place] Pager [phone] or [phone]  02/22/2017, 3:02 PM   PCCM ATTENDING ATTESTATION:  I have evaluated patient with the APP [place], reviewed database in its entirety and discussed care plan in detail. In addition, this patient was discussed on multidisciplinary rounds. I agree with the above findings and documentation  Merton Border, MD PCCM service Mobile [phone] Pager [phone] 02/22/2017 4:55 PM

## 2017-02-22 NOTE — Care Management (Signed)
RNCM met with patient's family and they have decided on LTAC at [facility]. Erika with [facility] will provide room and report number. Unit clerk updated on need for [facility]; RN updated. NP notified. No other RNCM needs.

## 2017-02-22 NOTE — Care Management (Signed)
RNCM spoke with RN and patient's granddaughter still has not arrived.

## 2017-02-22 NOTE — Progress Notes (Signed)
Attempted to call report. Left call back number.

## 2017-02-22 NOTE — Progress Notes (Signed)
[facility] [place] NAME: Melissa Cochran    MR#:  [id number]  DATE OF BIRTH:  11-25-1935  SUBJECTIVE:  CHIEF COMPLAINT:   Chief Complaint  Patient presents with  . Seizures   Admitted for altered mental status, seizures, meningitis. Had to be intubated. LP done after stopping anticoagulation.  Intubated. Off sedation  REVIEW OF SYSTEMS:  Pt is intubated, can not give ROS.  ROS  DRUG ALLERGIES:  No Known Allergies  VITALS:  Blood pressure (!) 107/59, pulse (!) 103, temperature 98.5 F (36.9 C), temperature source Axillary, resp. rate (!) 23, height 5\' 5"  (1.651 m), weight 89.4 kg (197 lb 1.5 oz), SpO2 100 %.  PHYSICAL EXAMINATION:  GENERAL:  81 y.o.-year-old patient lying in the bed with no acute distress.  EYES: Pupils equal, round, reactive to light . No scleral icterus.   HEENT: Head atraumatic, normocephalic. Oropharynx and nasopharynx clear. ET tube NECK:  Supple, no jugular venous distention. No thyroid enlargement, no tenderness.  LUNGS: Normal breath sounds bilaterally, no wheezing, rales,rhonchi or crepitation. No use of accessory muscles of respiration. ETT , on vent support. CARDIOVASCULAR: S1, S2 normal. No murmurs, rubs, or gallops.  ABDOMEN: Soft, nontender, nondistended. Bowel sounds present. No organomegaly or mass.  EXTREMITIES: No pedal edema, cyanosis, or clubbing.  NEUROLOGIC: on vent.  Marland Kitchen PSYCHIATRIC: The patient is sedated.  SKIN: No obvious rash, lesion, or ulcer.   Physical Exam LABORATORY PANEL:   CBC  Recent Labs Lab 02/22/17 0507  WBC 17.1*  HGB 8.9*  HCT 27.9*  PLT 284   ------------------------------------------------------------------------------------------------------------------  Chemistries   Recent Labs Lab 02/19/17 0444  02/22/17 0507  NA 137  < > 134*  K 3.6  < > 4.1  CL 100*  < > 101  CO2 29  < > 23  GLUCOSE 157*  < > 108*  BUN 38*  < > 99*  CREATININE 0.74  < > 2.57*   CALCIUM 8.8*  < > 8.2*  MG 2.4  --   --   < > = values in this interval not displayed. ------------------------------------------------------------------------------------------------------------------  Cardiac Enzymes No results for input(s): TROPONINI in the last 168 hours. ------------------------------------------------------------------------------------------------------------------  RADIOLOGY:  Ct Abdomen Pelvis Wo Contrast  Result Date: 02/21/2017 CLINICAL DATA:  Inpatient. Urosepsis. Right hydronephrosis at ultrasound. EXAM: CT ABDOMEN AND PELVIS WITHOUT CONTRAST TECHNIQUE: Multidetector CT imaging of the abdomen and pelvis was performed following the standard protocol without IV contrast. COMPARISON:  02/20/2017 renal sonogram. 07/08/2016 CT abdomen/ pelvis. FINDINGS: Limited motion degraded noncontrast scan. Lower chest: Small left and trace right dependent bilateral pleural effusions with associated mild dependent compressive atelectasis in both lower lobes, left greater than right. Coronary atherosclerosis. Hepatobiliary: Normal liver size. No discrete liver mass. Normal gallbladder with no radiopaque cholelithiasis. No biliary ductal dilatation. Pancreas: Normal, with no mass or duct dilation. Spleen: Normal size. No mass. Adrenals/Urinary Tract: Normal adrenals. There is mild to moderate bilateral hydroureteronephrosis to the level of the ureterovesical junctions bilaterally. No right renal stones. No right ureteral stones. Three nonobstructing lower left renal stones, largest 9 mm. Distal left pelvic ureteral 2 mm stone (series 3/ image 74), which does not appear to be obstructing. No additional left ureteral stones. No contour deforming renal masses. Bilateral perinephric edema and ill-defined fluid, left greater than right, extending into the anterior and posterior paranephric retroperitoneal spaces bilaterally. Moderately distended bladder despite the presence of an indwelling Foley  catheter within the posterior bladder lumen. No  bladder wall thickening. No bladder stones. Stomach/Bowel: Enteric tube terminates in the proximal stomach. Stable postsurgical changes from distal gastrectomy with intact appearing gastrojejunal anastomosis. Normal caliber small bowel with no small bowel wall thickening. Appendix not discretely visualized. No pericecal inflammatory changes. Mild sigmoid diverticulosis, with no large bowel wall thickening. Fluid levels throughout the large bowel. Rectal drainage catheter in place. Vascular/Lymphatic: Atherosclerotic abdominal aorta with stable ectatic 2.5 cm infrarenal abdominal aorta. No pathologically enlarged lymph nodes in the abdomen or pelvis. Reproductive: Status post hysterectomy, with no abnormal findings at the vaginal cuff. Cystic 3.9 x 2.5 cm right adnexal mass, previously 3.9 x 2.4 cm on 07/08/2016, not appreciably changed. Lobulated cystic multilocular 9.7 x 6.3 cm left adnexal mass (series 3/image 72) with thickened internal septations and thin mural calcification, previously 9.2 x 5.7 cm, mildly increased. Other: No pneumoperitoneum, ascites or focal fluid collection. Anasarca. Musculoskeletal: No aggressive appearing focal osseous lesions. Moderate thoracolumbar spondylosis, most prominent at L4-5, not appreciably changed. Partially visualized left total hip arthroplasty. IMPRESSION: 1. Mild-to-moderate bilateral hydroureteronephrosis to the level of the ureterovesical junctions bilaterally. Nonobstructing lower left renal stones. Punctate 2 mm left pelvic ureteral stone, which appears nonobstructing. No right renal or right ureteral stones. 2. Moderately distended bladder despite the presence of an indwelling Foley catheter in the bladder. Recommend clinical evaluation to exclude Foley dysfunction. 3. Small left and trace right bilateral dependent pleural effusions with associated mild bibasilar atelectasis. Anasarca. 4. Complex bilateral cystic  adnexal masses, stable on the right and mildly increased in size on the left, indeterminate for neoplasm. Recommend short-term outpatient gynecology consultation and transabdominal/transvaginal pelvic ultrasound or pelvic MRI without and with IV contrast correlation. 5. No evidence of bowel obstruction. Stable postsurgical changes in the stomach. Fluid levels throughout the large bowel, indicating a diarrheal/ malabsorptive state. 6. Aortic Atherosclerosis (ICD10-I70.0). Stable 2.5 cm ectatic infrarenal abdominal aorta, at risk for aneurysm development. Recommend follow-up aortic ultrasound in 5 years. This recommendation follows ACR consensus guidelines: White Paper of the ACR Incidental Findings Committee II on Vascular Findings. J Am Coll Radiol 2013; 10:789-794. Electronically Signed   By: Ilona Sorrel M.D.   On: 02/21/2017 14:57   US Renal  Result Date: 02/20/2017 CLINICAL DATA:  81 y/o  F; acute renal failure. EXAM: RENAL / URINARY TRACT ULTRASOUND COMPLETE COMPARISON:  02/15/2017 FINDINGS: Right Kidney: Length: 12.1 cm. Normal echogenicity. No mass. Mild right hydronephrosis. Left Kidney: Length: 13.0 cm. Lower pole echogenic foci measuring up to 13 mm compatible with nephrolithiasis. No hydronephrosis. Bladder: Appears normal for degree of bladder distention. IMPRESSION: 1. Mild right hydronephrosis. 2. Left kidney lower pole nephrolithiasis. Electronically Signed   By: Kristine Garbe M.D.   On: 02/20/2017 17:21   Dg Chest Port 1 View  Result Date: 02/22/2017 CLINICAL DATA:  81 year old female respiratory failure. Subsequent encounter. EXAM: PORTABLE CHEST 1 VIEW COMPARISON:  02/17/2017 chest x-ray. FINDINGS: Rotation to the left. Right PICC line tip proximal to mid superior vena cava. Endotracheal tube tip 4.2 cm above the carina. Nasogastric tube courses below the diaphragm. Tip is not included on the present exam. Left-sided pleural effusion.  Left base atelectasis/ infiltrate. Mild  cardiomegaly. Calcified slightly tortuous aorta. IMPRESSION: Left-sided pleural effusion with left lobe atelectasis versus infiltrate. Aortic Atherosclerosis (ICD10-I70.0). Electronically Signed   By: Genia Del M.D.   On: 02/22/2017 07:05    ASSESSMENT AND PLAN:   Active Problems:   Convulsive status epilepticus (HCC)   Pressure injury of skin   Encephalitis  Acute renal failure with tubular necrosis (HCC)   Palliative care encounter  * Acute encephalopathy- Altered mental status   Likely meningitis Vs encephalitis   CSF Cx negative - also HSV PCR negative. VZV pending.   CT lumber spine checked as she had some blood at end of procedure ( Was on anticoagulant 2 days ago) negative contiunue Meropenem and Acyclovir.  * AKI likely due to ATN Appreciate nephrology input. Monitor I/O and repeat labs in AM  * Status epilepticus  continue sedation medication IV, Keppra IV and neurology consult.  * Acute respiratory failure with hypoxia Continue ventilation and appreciate intensivist help  * ESBL E coli UTI On meropenem  * Elevated troponin. Possible due to demanding ischemia secondary to seizure, respiratory failure and sepsis.     Echo pending  * Accelerated hypertension. IV hydralazine when necessary.  * COPD. Stable.  * Chronic diastolic CHF. Stable.  CODE STATUS: Full.  TOTAL TIME TAKING CARE OF THIS PATIENT: 25 minutes.   Poor prognosis.  Hillary Bow R M.D on 02/22/2017   Between 7am to 6pm - Pager - [phone]  After 6pm go to [email or web address] - password [email or web address] [facility]  Office  [phone]  CC: Primary care physician; Teodoro Spray, MD  Note: This dictation was prepared with Dragon dictation along with smaller phrase technology. Any transcriptional errors that result from this process are unintentional.

## 2017-02-22 NOTE — Care Management (Signed)
Both LTACs can take patient today as long as patient is not requiring CRRT. I have presented both facilities to daughter at bedside however she asks that I meet with her daughter also later today.

## 2017-02-22 NOTE — Progress Notes (Signed)
Subjective: Patient remains unresponsive.  Patient has had multiple episodes of instability with her vitals.  Decision made by family for patient to go to [facility]  Objective: Current vital signs: BP (!) 167/82   Pulse (!) 112   Temp 98.4 F (36.9 C) (Axillary)   Resp (!) 29   Ht '5\' 5"'$  (1.651 m)   Wt 89.4 kg (197 lb 1.5 oz)   SpO2 97%   BMI 32.80 kg/m  Vital signs in last 24 hours: Temp:  [97.6 F (36.4 C)-98.6 F (37 C)] 98.4 F (36.9 C) (10/16 1200) Pulse Rate:  [84-124] 112 (10/16 1300) Resp:  [23-33] 29 (10/16 1300) BP: (92-186)/(58-99) 167/82 (10/16 1300) SpO2:  [89 %-100 %] 97 % (10/16 1300) FiO2 (%):  [28 %] 28 % (10/16 1200) Weight:  [89.4 kg (197 lb 1.5 oz)] 89.4 kg (197 lb 1.5 oz) (10/16 0500)  Intake/Output from previous day: 10/15 0701 - 10/16 0700 In: 1961.1 [I.V.:755.4; NG/GT:735.7; IV Piggyback:470] Out: 2055 [Urine:1620; Emesis/NG output:10; Stool:425] Intake/Output this shift: Total I/O In: 1037.7 [I.V.:30; NG/GT:802.7; IV Piggyback:205] Out: 650 [Urine:550; Stool:100] Nutritional status:    Neurologic Exam: Mental Status: Patient does not respond to verbal stimuli. Does not respond to deep sternal rub. Does not follow commands. No verbalizations noted.  Cranial Nerves: II: patient does not respond confrontation bilaterally, pupils right 27m, left 322mand reactivebilaterally III,IV,VI: Absent oculocephalic maneuvers bilaterally.  V,VII: corneal reflex present bilaterally VIII: patient does not respond to verbal stimuli IX,X: gag reflex unable to test, XI: trapezius strength unable to test bilaterally XII: tongue strength unable to test Motor: No spontaneous movement noted Sensory: Does not respond to noxious stimuli in any extremity.   Lab Results: Basic Metabolic Panel:  Recent Labs Lab 02/15/17 1735  02/16/17 0906 02/18/17 1620 02/19/17 0444 02/20/17 1039 02/21/17 0453 02/22/17 0507  NA  --   < > 136 135 137 135 135 134*  K   --   < > 3.8 3.5 3.6 3.8 4.0 4.1  CL  --   < > 98* 98* 100* 98* 99* 101  CO2  --   < > '28 28 29 24 22 23  '$ GLUCOSE  --   < > 146* 131* 157* 160* 127* 108*  BUN  --   < > 29* 36* 38* 75* 88* 99*  CREATININE  --   < > 0.73 0.65 0.74 1.98* 2.34* 2.57*  CALCIUM  --   < > 8.5* 8.4* 8.8* 8.9 8.3* 8.2*  MG 2.2  --  2.2  --  2.4  --   --   --   PHOS 2.1*  --  3.2  --  2.6  --   --   --   < > = values in this interval not displayed.  Liver Function Tests: No results for input(s): AST, ALT, ALKPHOS, BILITOT, PROT, ALBUMIN in the last 168 hours. No results for input(s): LIPASE, AMYLASE in the last 168 hours. No results for input(s): AMMONIA in the last 168 hours.  CBC:  Recent Labs Lab 02/18/17 1620 02/22/17 0507  WBC 13.3* 17.1*  HGB 10.6* 8.9*  HCT 32.6* 27.9*  MCV 90.2 90.1  PLT 224 284    Cardiac Enzymes:  Recent Labs Lab 02/21/17 0453  CKTOTAL 5*    Lipid Panel: No results for input(s): CHOL, TRIG, HDL, CHOLHDL, VLDL, LDLCALC in the last 168 hours.  CBG:  Recent Labs Lab 02/21/17 1941 02/21/17 2339 02/22/17 0339 02/22/17 0733 02/22/17 1150  GLUCAP 111* 125*  107* 110* 49*    Microbiology: Results for orders placed or performed during the hospital encounter of 02/13/17  Blood Culture (routine x 2)     Status: None   Collection Time: 02/13/17  5:29 PM  Result Value Ref Range Status   Specimen Description BLOOD LEFT HAND  Final   Special Requests   Final    BOTTLES DRAWN AEROBIC AND ANAEROBIC Blood Culture adequate volume   Culture NO GROWTH 5 DAYS  Final   Report Status 02/18/2017 FINAL  Final  Blood Culture (routine x 2)     Status: Abnormal   Collection Time: 02/13/17  5:29 PM  Result Value Ref Range Status   Specimen Description BLOOD RIGHT HAND  Final   Special Requests   Final    BOTTLES DRAWN AEROBIC AND ANAEROBIC Blood Culture adequate volume   Culture  Setup Time   Final    GRAM POSITIVE COCCI AEROBIC BOTTLE ONLY CRITICAL RESULT CALLED TO, READ  BACK BY AND VERIFIED WITH: GARRETT COFFEE AT 1601 02/15/17 SDR    Culture (A)  Final    STAPHYLOCOCCUS SPECIES (COAGULASE NEGATIVE) THE SIGNIFICANCE OF ISOLATING THIS ORGANISM FROM A SINGLE SET OF BLOOD CULTURES WHEN MULTIPLE SETS ARE DRAWN IS UNCERTAIN. PLEASE NOTIFY THE MICROBIOLOGY DEPARTMENT WITHIN ONE WEEK IF SPECIATION AND SENSITIVITIES ARE REQUIRED. Performed at [facility], [place] [address] [place], [place] [address]    Report Status 02/17/2017 FINAL  Final  Urine culture     Status: Abnormal   Collection Time: 02/13/17  5:29 PM  Result Value Ref Range Status   Specimen Description URINE, RANDOM  Final   Special Requests NONE  Final   Culture (A)  Final    >=100,000 COLONIES/mL ESCHERICHIA COLI Confirmed Extended Spectrum Beta-Lactamase Producer (ESBL).  In bloodstream infections from ESBL organisms, carbapenems are preferred over piperacillin/tazobactam. They are shown to have a lower risk of mortality. Performed at [facility], [place] [address], [place], [place] [address]    Report Status 02/16/2017 FINAL  Final   Organism ID, Bacteria ESCHERICHIA COLI (A)  Final      Susceptibility   Escherichia coli - MIC*    AMPICILLIN >=32 RESISTANT Resistant     CEFAZOLIN >=64 RESISTANT Resistant     CEFTRIAXONE >=64 RESISTANT Resistant     CIPROFLOXACIN >=4 RESISTANT Resistant     GENTAMICIN <=1 SENSITIVE Sensitive     IMIPENEM <=0.25 SENSITIVE Sensitive     NITROFURANTOIN <=16 SENSITIVE Sensitive     TRIMETH/SULFA >=320 RESISTANT Resistant     AMPICILLIN/SULBACTAM >=32 RESISTANT Resistant     PIP/TAZO <=4 SENSITIVE Sensitive     Extended ESBL POSITIVE Resistant     * >=100,000 COLONIES/mL ESCHERICHIA COLI  Blood Culture ID Panel (Reflexed)     Status: None   Collection Time: 02/13/17  5:29 PM  Result Value Ref Range Status   Enterococcus species NOT DETECTED NOT DETECTED Final   Listeria monocytogenes NOT DETECTED NOT DETECTED Final   Staphylococcus species NOT  DETECTED NOT DETECTED Final   Staphylococcus aureus NOT DETECTED NOT DETECTED Final   Streptococcus species NOT DETECTED NOT DETECTED Final   Streptococcus agalactiae NOT DETECTED NOT DETECTED Final   Streptococcus pneumoniae NOT DETECTED NOT DETECTED Final   Streptococcus pyogenes NOT DETECTED NOT DETECTED Final   Acinetobacter baumannii NOT DETECTED NOT DETECTED Final   Enterobacteriaceae species NOT DETECTED NOT DETECTED Final   Enterobacter cloacae complex NOT DETECTED NOT DETECTED Final   Escherichia coli NOT  DETECTED NOT DETECTED Final   Klebsiella oxytoca NOT DETECTED NOT DETECTED Final   Klebsiella pneumoniae NOT DETECTED NOT DETECTED Final   Proteus species NOT DETECTED NOT DETECTED Final   Serratia marcescens NOT DETECTED NOT DETECTED Final   Haemophilus influenzae NOT DETECTED NOT DETECTED Final   Neisseria meningitidis NOT DETECTED NOT DETECTED Final   Pseudomonas aeruginosa NOT DETECTED NOT DETECTED Final   Candida albicans NOT DETECTED NOT DETECTED Final   Candida glabrata NOT DETECTED NOT DETECTED Final   Candida krusei NOT DETECTED NOT DETECTED Final   Candida parapsilosis NOT DETECTED NOT DETECTED Final   Candida tropicalis NOT DETECTED NOT DETECTED Final  MRSA PCR Screening     Status: None   Collection Time: 02/14/17  1:23 AM  Result Value Ref Range Status   MRSA by PCR NEGATIVE NEGATIVE Final    Comment:        The GeneXpert MRSA Assay (FDA approved for NASAL specimens only), is one component of a comprehensive MRSA colonization surveillance program. It is not intended to diagnose MRSA infection nor to guide or monitor treatment for MRSA infections.   CSF culture     Status: None   Collection Time: 02/15/17 12:49 PM  Result Value Ref Range Status   Specimen Description CSF  Final   Special Requests Normal  Final   Gram Stain   Final     MODERATE WBC SEEN NO ORGANISMS SEEN RARE RBC SEEN    Culture   Final    NO GROWTH 3 DAYS Performed at [facility], [place] [address], [place], [place] [address]    Report Status 02/19/2017 FINAL  Final  Culture, fungus without smear     Status: None (Preliminary result)   Collection Time: 02/15/17 12:49 PM  Result Value Ref Range Status   Specimen Description CSF  Final   Special Requests NONE  Final   Culture   Final    NO FUNGUS ISOLATED AFTER 3 DAYS Performed at [facility], [place] [address] [place], [place] [address]    Report Status PENDING  Incomplete  C difficile quick scan w PCR reflex     Status: None   Collection Time: 02/20/17 10:51 AM  Result Value Ref Range Status   C Diff antigen NEGATIVE NEGATIVE Final   C Diff toxin NEGATIVE NEGATIVE Final   C Diff interpretation No C. difficile detected.  Final    Coagulation Studies: No results for input(s): LABPROT, INR in the last 72 hours.  Imaging: Ct Abdomen Pelvis Wo Contrast  Result Date: 02/21/2017 CLINICAL DATA:  Inpatient. Urosepsis. Right hydronephrosis at ultrasound. EXAM: CT ABDOMEN AND PELVIS WITHOUT CONTRAST TECHNIQUE: Multidetector CT imaging of the abdomen and pelvis was performed following the standard protocol without IV contrast. COMPARISON:  02/20/2017 renal sonogram. 07/08/2016 CT abdomen/ pelvis. FINDINGS: Limited motion degraded noncontrast scan. Lower chest: Small left and trace right dependent bilateral pleural effusions with associated mild dependent compressive atelectasis in both lower lobes, left greater than right. Coronary atherosclerosis. Hepatobiliary: Normal liver size. No discrete liver mass. Normal gallbladder with no radiopaque cholelithiasis. No biliary ductal dilatation. Pancreas: Normal, with no mass or duct dilation. Spleen: Normal size. No mass. Adrenals/Urinary Tract: Normal adrenals. There is mild to moderate bilateral hydroureteronephrosis to the level of the ureterovesical junctions bilaterally. No right renal stones. No right ureteral stones. Three nonobstructing lower left renal stones,  largest 9 mm. Distal left pelvic ureteral 2 mm stone (series 3/ image 74), which does  not appear to be obstructing. No additional left ureteral stones. No contour deforming renal masses. Bilateral perinephric edema and ill-defined fluid, left greater than right, extending into the anterior and posterior paranephric retroperitoneal spaces bilaterally. Moderately distended bladder despite the presence of an indwelling Foley catheter within the posterior bladder lumen. No bladder wall thickening. No bladder stones. Stomach/Bowel: Enteric tube terminates in the proximal stomach. Stable postsurgical changes from distal gastrectomy with intact appearing gastrojejunal anastomosis. Normal caliber small bowel with no small bowel wall thickening. Appendix not discretely visualized. No pericecal inflammatory changes. Mild sigmoid diverticulosis, with no large bowel wall thickening. Fluid levels throughout the large bowel. Rectal drainage catheter in place. Vascular/Lymphatic: Atherosclerotic abdominal aorta with stable ectatic 2.5 cm infrarenal abdominal aorta. No pathologically enlarged lymph nodes in the abdomen or pelvis. Reproductive: Status post hysterectomy, with no abnormal findings at the vaginal cuff. Cystic 3.9 x 2.5 cm right adnexal mass, previously 3.9 x 2.4 cm on 07/08/2016, not appreciably changed. Lobulated cystic multilocular 9.7 x 6.3 cm left adnexal mass (series 3/image 72) with thickened internal septations and thin mural calcification, previously 9.2 x 5.7 cm, mildly increased. Other: No pneumoperitoneum, ascites or focal fluid collection. Anasarca. Musculoskeletal: No aggressive appearing focal osseous lesions. Moderate thoracolumbar spondylosis, most prominent at L4-5, not appreciably changed. Partially visualized left total hip arthroplasty. IMPRESSION: 1. Mild-to-moderate bilateral hydroureteronephrosis to the level of the ureterovesical junctions bilaterally. Nonobstructing lower left renal stones.  Punctate 2 mm left pelvic ureteral stone, which appears nonobstructing. No right renal or right ureteral stones. 2. Moderately distended bladder despite the presence of an indwelling Foley catheter in the bladder. Recommend clinical evaluation to exclude Foley dysfunction. 3. Small left and trace right bilateral dependent pleural effusions with associated mild bibasilar atelectasis. Anasarca. 4. Complex bilateral cystic adnexal masses, stable on the right and mildly increased in size on the left, indeterminate for neoplasm. Recommend short-term outpatient gynecology consultation and transabdominal/transvaginal pelvic ultrasound or pelvic MRI without and with IV contrast correlation. 5. No evidence of bowel obstruction. Stable postsurgical changes in the stomach. Fluid levels throughout the large bowel, indicating a diarrheal/ malabsorptive state. 6. Aortic Atherosclerosis (ICD10-I70.0). Stable 2.5 cm ectatic infrarenal abdominal aorta, at risk for aneurysm development. Recommend follow-up aortic ultrasound in 5 years. This recommendation follows ACR consensus guidelines: White Paper of the ACR Incidental Findings Committee II on Vascular Findings. J Am Coll Radiol 2013; 10:789-794. Electronically Signed   By: Ilona Sorrel M.D.   On: 02/21/2017 14:57   US Renal  Result Date: 02/20/2017 CLINICAL DATA:  81 y/o  F; acute renal failure. EXAM: RENAL / URINARY TRACT ULTRASOUND COMPLETE COMPARISON:  02/15/2017 FINDINGS: Right Kidney: Length: 12.1 cm. Normal echogenicity. No mass. Mild right hydronephrosis. Left Kidney: Length: 13.0 cm. Lower pole echogenic foci measuring up to 13 mm compatible with nephrolithiasis. No hydronephrosis. Bladder: Appears normal for degree of bladder distention. IMPRESSION: 1. Mild right hydronephrosis. 2. Left kidney lower pole nephrolithiasis. Electronically Signed   By: Kristine Garbe M.D.   On: 02/20/2017 17:21   Dg Chest Port 1 View  Result Date: 02/22/2017 CLINICAL  DATA:  81 year old female respiratory failure. Subsequent encounter. EXAM: PORTABLE CHEST 1 VIEW COMPARISON:  02/17/2017 chest x-ray. FINDINGS: Rotation to the left. Right PICC line tip proximal to mid superior vena cava. Endotracheal tube tip 4.2 cm above the carina. Nasogastric tube courses below the diaphragm. Tip is not included on the present exam. Left-sided pleural effusion.  Left base atelectasis/ infiltrate. Mild cardiomegaly. Calcified slightly tortuous aorta. IMPRESSION:  Left-sided pleural effusion with left lobe atelectasis versus infiltrate. Aortic Atherosclerosis (ICD10-I70.0). Electronically Signed   By: Genia Del M.D.   On: 02/22/2017 07:05    Medications:  I have reviewed the patient's current medications. Scheduled: . chlorhexidine gluconate (MEDLINE KIT)  15 mL Mouth Rinse BID  . feeding supplement (PRO-STAT SUGAR FREE 64)  30 mL Per Tube BID  . feeding supplement (VITAL 1.5 CAL)  1,000 mL Per Tube Q24H  . free water  100 mL Per Tube Q8H  . mouth rinse  15 mL Mouth Rinse 10 times per day  . multivitamin  15 mL Per Tube Daily  . pantoprazole sodium  40 mg Per Tube Q1200  . senna-docusate  1 tablet Per Tube BID  . sodium chloride flush  10-40 mL Intracatheter Q12H    Assessment/Plan: Patient not improved.  EEG shows GPEDS.  Unclear if there may be continued seizure activity particularly in the setting of unstable vitals.  Depakote started on yesterday.  Level<10 today.    Recommendations: 1.  Increase Depakote to '1000mg'$  q 8 hours.  Level to be monitored with follow up EEG's when deemed appropriate.     LOS: 9 days   Alexis Goodell, MD Neurology [phone] 02/22/2017  2:24 PM

## 2017-02-22 NOTE — Progress Notes (Signed)
[facility] Awaiting NP to fill out EMTALA so report can be called. Will continue to assess.

## 2017-02-22 NOTE — Progress Notes (Signed)
Pharmacy Antibiotic Note  DEIRDRE GRYDER is a 81 y.o. 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 Also treating for UTI due to ESBL e coli.  Pharmacy has been consulted for meropenem dosing and acyclovir dosing.  Plan: Continue meropenem 1 gm IV Q12H and acyclovir 650 mg IV Q24H. Antimicrobials adjusted for CrCl < 25 ml/min.   Height: 5\' 5"  (165.1 cm) Weight: 197 lb 1.5 oz (89.4 kg) IBW/kg (Calculated) : 57  Temp (24hrs), Avg:98.3 F (36.8 C), Min:97.6 F (36.4 C), Max:98.6 F (37 C)   Recent Labs Lab 02/17/17 1630 02/18/17 1620 02/19/17 0444 02/20/17 1039 02/20/17 1600 02/21/17 0453 02/22/17 0507  WBC  --  13.3*  --   --   --   --  17.1*  CREATININE  --  0.65 0.74 1.98*  --  2.34* 2.57*  VANCOTROUGH 13*  --   --   --   --   --   --   VANCORANDOM  --   --   --   --  9  --   --     Estimated Creatinine Clearance: 19.6 mL/min (A) (by C-G formula based on SCr of 2.57 mg/dL (H)).    No Known Allergies  Antimicrobials this admission: Zosyn 10/7 X 1 Ceftriaxone 10/8 >> 10/10 Ampicillin 10/8 >> 10/11 Vancomycin 10/8 >> 10/12 Acyclovir 10/8 >>  Meropenem 10/10 >>   Dose adjustments this admission:   Microbiology results: 10/7 BCx: coag neg staph (possible contaminant) 10/7 BCx: no growth 5 days 10/7 UCx: ESBL e coli  10/8 MRSA PCR: negative 10/9 CSF Cx: no growth 3 days 10/9 CSF Fungal Cx: no fungus isolated after 1 day 10/9 HSV PCR: negative 10/9 VZV PCR: pending   Thank you for allowing pharmacy to be a part of this patient's care.  Oralia Manis 02/22/2017 2:41 PM

## 2017-02-22 NOTE — Progress Notes (Deleted)
Pt. Was suctioned prior to extubation for a small amount of clear secretions. She was extubated without incident and place on a 2 L nasal cannula. No stridor was heard during auscultation.

## 2017-02-23 ENCOUNTER — Other Ambulatory Visit (HOSPITAL_COMMUNITY): Payer: Medicare Other

## 2017-02-23 DIAGNOSIS — N200 Calculus of kidney: Secondary | ICD-10-CM | POA: Diagnosis not present

## 2017-02-23 DIAGNOSIS — Z4682 Encounter for fitting and adjustment of non-vascular catheter: Secondary | ICD-10-CM | POA: Diagnosis not present

## 2017-02-23 LAB — CBC WITH DIFFERENTIAL/PLATELET
BASOS PCT: 0 %
Basophils Absolute: 0 10*3/uL (ref 0.0–0.1)
EOS ABS: 0.3 10*3/uL (ref 0.0–0.7)
Eosinophils Relative: 2 %
HCT: 29.8 % — ABNORMAL LOW (ref 36.0–46.0)
Hemoglobin: 9.3 g/dL — ABNORMAL LOW (ref 12.0–15.0)
Lymphocytes Relative: 6 %
Lymphs Abs: 0.8 10*3/uL (ref 0.7–4.0)
MCH: 28.5 pg (ref 26.0–34.0)
MCHC: 31.2 g/dL (ref 30.0–36.0)
MCV: 91.4 fL (ref 78.0–100.0)
MONOS PCT: 5 %
Monocytes Absolute: 0.7 10*3/uL (ref 0.1–1.0)
NEUTROS PCT: 87 %
Neutro Abs: 12 10*3/uL — ABNORMAL HIGH (ref 1.7–7.7)
Platelets: 321 10*3/uL (ref 150–400)
RBC: 3.26 MIL/uL — ABNORMAL LOW (ref 3.87–5.11)
RDW: 15.3 % (ref 11.5–15.5)
WBC: 13.8 10*3/uL — ABNORMAL HIGH (ref 4.0–10.5)

## 2017-02-23 LAB — COMPREHENSIVE METABOLIC PANEL
ALK PHOS: 199 U/L — AB (ref 38–126)
ALT: 84 U/L — AB (ref 14–54)
AST: 60 U/L — ABNORMAL HIGH (ref 15–41)
Albumin: 1.4 g/dL — ABNORMAL LOW (ref 3.5–5.0)
Anion gap: 12 (ref 5–15)
BILIRUBIN TOTAL: 0.4 mg/dL (ref 0.3–1.2)
BUN: 103 mg/dL — ABNORMAL HIGH (ref 6–20)
CALCIUM: 8.6 mg/dL — AB (ref 8.9–10.3)
CO2: 24 mmol/L (ref 22–32)
CREATININE: 2.33 mg/dL — AB (ref 0.44–1.00)
Chloride: 102 mmol/L (ref 101–111)
GFR calc non Af Amer: 19 mL/min — ABNORMAL LOW (ref >60)
GFR, EST AFRICAN AMERICAN: 22 mL/min — AB (ref >60)
Glucose, Bld: 90 mg/dL (ref 65–99)
Potassium: 4 mmol/L (ref 3.5–5.1)
SODIUM: 138 mmol/L (ref 135–145)
TOTAL PROTEIN: 4.4 g/dL — AB (ref 6.5–8.1)

## 2017-02-23 LAB — PROTIME-INR
INR: 1.18
PROTHROMBIN TIME: 14.9 s (ref 11.4–15.2)

## 2017-02-23 LAB — APTT: aPTT: 32 seconds (ref 24–36)

## 2017-02-23 LAB — GLOMERULAR BASEMENT MEMBRANE ANTIBODIES: GBM Ab: 2 units (ref 0–20)

## 2017-02-25 LAB — C DIFFICILE QUICK SCREEN W PCR REFLEX
C DIFFICLE (CDIFF) ANTIGEN: NEGATIVE
C Diff interpretation: NOT DETECTED
C Diff toxin: NEGATIVE

## 2017-03-01 LAB — BASIC METABOLIC PANEL
Anion gap: 10 (ref 5–15)
BUN: 99 mg/dL — AB (ref 6–20)
CALCIUM: 9.4 mg/dL (ref 8.9–10.3)
CO2: 25 mmol/L (ref 22–32)
Chloride: 112 mmol/L — ABNORMAL HIGH (ref 101–111)
Creatinine, Ser: 1.96 mg/dL — ABNORMAL HIGH (ref 0.44–1.00)
GFR calc Af Amer: 27 mL/min — ABNORMAL LOW (ref >60)
GFR, EST NON AFRICAN AMERICAN: 23 mL/min — AB (ref >60)
GLUCOSE: 122 mg/dL — AB (ref 65–99)
Potassium: 4.1 mmol/L (ref 3.5–5.1)
Sodium: 147 mmol/L — ABNORMAL HIGH (ref 135–145)

## 2017-03-01 NOTE — H&P (Signed)
PREOPERATIVE H&P  Chief Complaint: Acute on chronic respiratory failure  HPI: Melissa Cochran is a 81 y.o. female who presents for evaluation of acute on chronic respiratory failure. Admitted to outside hospital because of acute mental status changes with seizures secondary to encephalitis. Patient was subsequently transferred to [facility] on 10/16 intubated. Patient was recommended subsequent trach placement.  Past Medical History:  Diagnosis Date  . Adnexal mass   . Anxiety   . Arthritis   . Bladder tumor   . Bleeding ulcer   . CHF (congestive heart failure) ([place])   . CHF (congestive heart failure) ([place])   . COPD (chronic obstructive pulmonary disease) ([place])   . Coronary artery disease   . GERD (gastroesophageal reflux disease)   . Heart block    left  . History of bleeding ulcers   . Hyperlipemia   . Hypertension   . Pneumonia   . Shortness of breath dyspnea    Past Surgical History:  Procedure Laterality Date  . ABDOMINAL HYSTERECTOMY    . CARDIAC CATHETERIZATION Left 10/21/2015   Procedure: Left Heart Cath and Coronary Angiography;  Surgeon: Isaias Cowman, MD;  Location: [facility];  Service: Cardiovascular;  Laterality: Left;  . CARDIAC CATHETERIZATION N/A 10/21/2015   Procedure: Coronary Stent Intervention;  Surgeon: Isaias Cowman, MD;  Location: [facility];  Service: Cardiovascular;  Laterality: N/A;  . CARDIAC SURGERY     cardiac cath  . CYSTOSCOPY W/ RETROGRADES Bilateral 01/20/2016   Procedure: CYSTOSCOPY WITH RETROGRADE PYELOGRAM;  Surgeon: Hollice Espy, MD;  Location: [facility];  Service: Urology;  Laterality: Bilateral;  . CYSTOSCOPY WITH STENT PLACEMENT Right 01/20/2016   Procedure: CYSTOSCOPY WITH STENT PLACEMENT;  Surgeon: Hollice Espy, MD;  Location: [facility];  Service: Urology;  Laterality: Right;  . HIP SURGERY     left  . STOMACH SURGERY    . TRANSURETHRAL RESECTION OF BLADDER TUMOR N/A 01/20/2016   Procedure:  TRANSURETHRAL RESECTION OF BLADDER TUMOR (TURBT);  Surgeon: Hollice Espy, MD;  Location: [facility];  Service: Urology;  Laterality: N/A;   Social History   Social History  . Marital status: Widowed    Spouse name: N/A  . Number of children: N/A  . Years of education: N/A   Social History Main Topics  . Smoking status: Former Smoker    Packs/day: 1.00    Years: 40.00    Quit date: 06/13/1998  . Smokeless tobacco: Never Used  . Alcohol use No  . Drug use: No  . Sexual activity: Not Currently   Other Topics Concern  . Not on file   Social History Narrative  . No narrative on file   Family History  Problem Relation Age of Onset  . Stomach cancer Mother   . CVA Father   . Bladder Cancer Neg Hx   . Kidney cancer Neg Hx    No Known Allergies Prior to Admission medications   Medication Sig Start Date End Date Taking? Authorizing Provider  acetaminophen (TYLENOL) 325 MG tablet Take 2 tablets (650 mg total) by mouth every 6 (six) hours as needed for mild pain (or Fever >/= 101). 02/22/17   Mikael Spray, NP  acetaminophen (TYLENOL) 650 MG suppository Place 1 suppository (650 mg total) rectally every 6 (six) hours as needed for mild pain (or Fever >/= 101). 02/22/17   Mikael Spray, NP  acyclovir 650 mg in dextrose 5 % 100 mL Inject 650 mg into the vein daily. 02/23/17  Mikael Spray, NP  albuterol (PROVENTIL) (2.5 MG/3ML) 0.083% nebulizer solution Take 3 mLs (2.5 mg total) by nebulization every 2 (two) hours as needed for wheezing. 02/22/17   Mikael Spray, NP  Amino Acids-Protein Hydrolys (FEEDING SUPPLEMENT, PRO-STAT SUGAR FREE 64,) LIQD Place 30 mLs into feeding tube 2 (two) times daily. 02/22/17   Mikael Spray, NP  chlorhexidine gluconate, MEDLINE KIT, (PERIDEX) 0.12 % solution 15 mLs by Mouth Rinse route 2 (two) times daily. 02/22/17   Mikael Spray, NP  hydrALAZINE (APRESOLINE) 20 MG/ML injection Inject 0.5 mLs (10 mg total) into the vein  every 6 (six) hours as needed (SBP>180 or DBP>100). 02/22/17   Mikael Spray, NP  levETIRAcetam 500 mg in sodium chloride 0.9 % 100 mL Inject 500 mg into the vein every 12 (twelve) hours. 02/23/17   Mikael Spray, NP  meropenem 1 g in sodium chloride 0.9 % 100 mL Inject 1 g into the vein every 12 (twelve) hours. 02/22/17   Mikael Spray, NP  metoprolol tartrate (LOPRESSOR) 5 MG/5ML SOLN injection Inject 2.5-5 mLs (2.5-5 mg total) into the vein every 6 (six) hours as needed (for heart rate >120). 02/22/17   Mikael Spray, NP  mouth rinse LIQD solution 15 mLs by Mouth Rinse route as needed. 02/22/17   Mikael Spray, NP  Multiple Vitamin (MULTIVITAMIN) LIQD Place 15 mLs into feeding tube daily. 02/23/17   Mikael Spray, NP  Nutritional Supplements (FEEDING SUPPLEMENT, VITAL 1.5 CAL,) LIQD Place 1,000 mLs into feeding tube daily. 02/23/17   Mikael Spray, NP  ondansetron (ZOFRAN) 4 MG/2ML SOLN injection Inject 2 mLs (4 mg total) into the vein every 6 (six) hours as needed for nausea. 02/22/17   Mikael Spray, NP  pantoprazole sodium (PROTONIX) 40 mg/20 mL PACK Place 20 mLs (40 mg total) into feeding tube daily at 12 noon. 02/23/17   Mikael Spray, NP  senna-docusate (SENOKOT-S) 8.6-50 MG tablet Place 1 tablet into feeding tube 2 (two) times daily. 02/22/17   Mikael Spray, NP  valproate 1,000 mg in dextrose 5 % 50 mL Inject 1,000 mg into the vein every 8 (eight) hours. 02/22/17   Mikael Spray, NP  Water For Irrigation, Sterile (FREE WATER) SOLN Place 100 mLs into feeding tube every 8 (eight) hours. 02/22/17   Mikael Spray, NP     Positive ROS: neg  All other systems have been reviewed and were otherwise negative with the exception of those mentioned in the HPI and as above.  Physical Exam: There were no vitals filed for this visit.  General: Intubated Neck: No palpable adenopathy or thyroid nodules Ear: Ear canal is clear  with normal appearing TMs Cardiovascular: Regular rate and rhythm, no murmur.  Respiratory: Clear to auscultation   Assessment/Plan: prolonged intubation Plan for Procedure(s): TRACHEOSTOMY   [name], MD 03/01/2017 6:20 PM

## 2017-03-02 ENCOUNTER — Other Ambulatory Visit (HOSPITAL_COMMUNITY): Payer: Self-pay

## 2017-03-02 ENCOUNTER — Encounter: Payer: Self-pay | Admitting: Certified Registered Nurse Anesthetist

## 2017-03-02 ENCOUNTER — Encounter (HOSPITAL_COMMUNITY): Admission: RE | Disposition: A | Discharge status: Other Acute Inpatient Hospital | Payer: Self-pay | Attending: Urology

## 2017-03-02 ENCOUNTER — Inpatient Hospital Stay
Admission: RE | Admit: 2017-03-02 | Discharge: 2017-03-10 | Disposition: A | Discharge status: Other Acute Inpatient Hospital | Payer: Self-pay | Source: Other Acute Inpatient Hospital | Attending: Internal Medicine | Admitting: Internal Medicine

## 2017-03-02 ENCOUNTER — Encounter (HOSPITAL_COMMUNITY): Payer: Medicare Other | Admitting: Certified Registered Nurse Anesthetist

## 2017-03-02 DIAGNOSIS — Z4659 Encounter for fitting and adjustment of other gastrointestinal appliance and device: Secondary | ICD-10-CM

## 2017-03-02 DIAGNOSIS — Z931 Gastrostomy status: Secondary | ICD-10-CM

## 2017-03-02 DIAGNOSIS — Z431 Encounter for attention to gastrostomy: Secondary | ICD-10-CM

## 2017-03-02 DIAGNOSIS — Z4682 Encounter for fitting and adjustment of non-vascular catheter: Secondary | ICD-10-CM | POA: Diagnosis not present

## 2017-03-02 DIAGNOSIS — J9620 Acute and chronic respiratory failure, unspecified whether with hypoxia or hypercapnia: Secondary | ICD-10-CM | POA: Diagnosis not present

## 2017-03-02 HISTORY — PX: TRACHEOSTOMY TUBE PLACEMENT: SHX814

## 2017-03-02 SURGERY — CREATION, TRACHEOSTOMY
Anesthesia: General | Site: Neck

## 2017-03-02 MED ORDER — PHENYLEPHRINE 40 MCG/ML (10ML) SYRINGE FOR IV PUSH (FOR BLOOD PRESSURE SUPPORT)
PREFILLED_SYRINGE | INTRAVENOUS | Status: AC
  Filled 2017-03-02: qty 10

## 2017-03-02 MED ORDER — EPHEDRINE 5 MG/ML INJ
INTRAVENOUS | Status: AC
  Filled 2017-03-02: qty 10

## 2017-03-02 MED ORDER — FENTANYL CITRATE (PF) 250 MCG/5ML IJ SOLN
INTRAMUSCULAR | Status: DC | PRN
  Administered 2017-03-02: 50 ug via INTRAVENOUS

## 2017-03-02 MED ORDER — PROPOFOL 10 MG/ML IV BOLUS
INTRAVENOUS | Status: DC | PRN
  Administered 2017-03-02: 50 mg via INTRAVENOUS

## 2017-03-02 MED ORDER — FENTANYL CITRATE (PF) 250 MCG/5ML IJ SOLN
INTRAMUSCULAR | Status: AC
  Filled 2017-03-02: qty 5

## 2017-03-02 MED ORDER — 0.9 % SODIUM CHLORIDE (POUR BTL) OPTIME
TOPICAL | Status: DC | PRN
  Administered 2017-03-02: 1000 mL

## 2017-03-02 MED ORDER — LIDOCAINE-EPINEPHRINE 1 %-1:100000 IJ SOLN
INTRAMUSCULAR | Status: DC | PRN
  Administered 2017-03-02: 20 mL

## 2017-03-02 MED ORDER — PROPOFOL 10 MG/ML IV BOLUS
INTRAVENOUS | Status: AC
  Filled 2017-03-02: qty 20

## 2017-03-02 MED ORDER — LACTATED RINGERS IV SOLN
INTRAVENOUS | Status: DC | PRN
  Administered 2017-03-02: 14:00:00 via INTRAVENOUS

## 2017-03-02 MED ORDER — ROCURONIUM BROMIDE 10 MG/ML (PF) SYRINGE
PREFILLED_SYRINGE | INTRAVENOUS | Status: DC | PRN
  Administered 2017-03-02: 40 mg via INTRAVENOUS

## 2017-03-02 MED ORDER — SUGAMMADEX SODIUM 200 MG/2ML IV SOLN
INTRAVENOUS | Status: DC | PRN
  Administered 2017-03-02: 200 mg via INTRAVENOUS

## 2017-03-02 MED ORDER — PHENYLEPHRINE 40 MCG/ML (10ML) SYRINGE FOR IV PUSH (FOR BLOOD PRESSURE SUPPORT)
PREFILLED_SYRINGE | INTRAVENOUS | Status: DC | PRN
  Administered 2017-03-02: 80 ug via INTRAVENOUS

## 2017-03-02 MED ORDER — LIDOCAINE-EPINEPHRINE 1 %-1:100000 IJ SOLN
INTRAMUSCULAR | Status: AC
  Filled 2017-03-02: qty 1

## 2017-03-02 SURGICAL SUPPLY — 39 items
ATTRACTOMAT 16X20 MAGNETIC DRP (DRAPES) IMPLANT
BLADE SURG 15 STRL LF DISP TIS (BLADE) ×1 IMPLANT
BLADE SURG 15 STRL SS (BLADE) ×2
CLEANER TIP ELECTROSURG 2X2 (MISCELLANEOUS) ×3 IMPLANT
COVER SURGICAL LIGHT HANDLE (MISCELLANEOUS) ×3 IMPLANT
DRAPE HALF SHEET 40X57 (DRAPES) IMPLANT
ELECT COATED BLADE 2.86 ST (ELECTRODE) ×3 IMPLANT
ELECT REM PT RETURN 9FT ADLT (ELECTROSURGICAL) ×3
ELECTRODE REM PT RTRN 9FT ADLT (ELECTROSURGICAL) ×1 IMPLANT
GAUZE SPONGE 4X4 16PLY XRAY LF (GAUZE/BANDAGES/DRESSINGS) ×3 IMPLANT
GEL ULTRASOUND 20GR AQUASONIC (MISCELLANEOUS) ×3 IMPLANT
GLOVE BIO SURGEON STRL SZ7 (GLOVE) ×3 IMPLANT
GLOVE SS BIOGEL STRL SZ 7.5 (GLOVE) ×1 IMPLANT
GLOVE SUPERSENSE BIOGEL SZ 7.5 (GLOVE) ×2
GOWN STRL REUS W/ TWL LRG LVL3 (GOWN DISPOSABLE) ×1 IMPLANT
GOWN STRL REUS W/ TWL XL LVL3 (GOWN DISPOSABLE) ×1 IMPLANT
GOWN STRL REUS W/TWL LRG LVL3 (GOWN DISPOSABLE) ×2
GOWN STRL REUS W/TWL XL LVL3 (GOWN DISPOSABLE) ×2
HOLDER TRACH TUBE VELCRO 19.5 (MISCELLANEOUS) ×3 IMPLANT
KIT BASIN OR (CUSTOM PROCEDURE TRAY) ×3 IMPLANT
KIT ROOM TURNOVER OR (KITS) ×3 IMPLANT
KIT SUCTION CATH 14FR (SUCTIONS) ×3 IMPLANT
NEEDLE HYPO 25GX1X1/2 BEV (NEEDLE) ×3 IMPLANT
NS IRRIG 1000ML POUR BTL (IV SOLUTION) ×3 IMPLANT
PACK EENT II TURBAN DRAPE (CUSTOM PROCEDURE TRAY) ×3 IMPLANT
PAD ARMBOARD 7.5X6 YLW CONV (MISCELLANEOUS) ×3 IMPLANT
PENCIL BUTTON HOLSTER BLD 10FT (ELECTRODE) ×3 IMPLANT
SPONGE DRAIN TRACH 4X4 STRL 2S (GAUZE/BANDAGES/DRESSINGS) ×3 IMPLANT
SPONGE INTESTINAL PEANUT (DISPOSABLE) ×3 IMPLANT
SUT SILK 2 0 SH CR/8 (SUTURE) ×3 IMPLANT
SUT SILK 3 0 TIES 10X30 (SUTURE) IMPLANT
SYR 5ML LUER SLIP (SYRINGE) ×3 IMPLANT
SYR CONTROL 10ML LL (SYRINGE) ×3 IMPLANT
TOWEL OR 17X24 6PK STRL BLUE (TOWEL DISPOSABLE) ×3 IMPLANT
TOWEL OR 17X26 10 PK STRL BLUE (TOWEL DISPOSABLE) ×3 IMPLANT
TUBE CONNECTING 12'X1/4 (SUCTIONS) ×1
TUBE CONNECTING 12X1/4 (SUCTIONS) ×2 IMPLANT
TUBE TRACH SHILEY  6 DIST  CUF (TUBING) ×3 IMPLANT
TUBE TRACH SHILEY 8 DIST CUF (TUBING) IMPLANT

## 2017-03-02 NOTE — Interval H&P Note (Signed)
History and Physical Interval Note:  03/02/2017 1:37 PM  Melissa Cochran  has presented today for surgery, with the diagnosis of prolonged intubation  The various methods of treatment have been discussed with the patient and family. After consideration of risks, benefits and other options for treatment, the patient has consented to  Procedure(s): TRACHEOSTOMY (N/A) as a surgical intervention .  The patient's history has been reviewed, patient examined, no change in status, stable for surgery.  I have reviewed the patient's chart and labs.  Questions were answered to the patient's satisfaction.     Veasna Santibanez

## 2017-03-02 NOTE — Anesthesia Preprocedure Evaluation (Signed)
Anesthesia Evaluation  Patient identified by MRN, date of birth, ID band  Reviewed: Allergy & Precautions, H&P , NPO status , Patient's Chart, lab work & pertinent test results, reviewed documented beta blocker date and time   History of Anesthesia Complications Negative for: history of anesthetic complications  Airway Mallampati: Intubated       Dental  (+) Teeth Intact   Pulmonary shortness of breath, pneumonia, resolved, COPD,  COPD inhaler, former smoker,     + decreased breath sounds      Cardiovascular hypertension, + CAD, + Peripheral Vascular Disease, +CHF and + Orthopnea  + dysrhythmias  Rhythm:Irregular     Neuro/Psych  Neuromuscular disease    GI/Hepatic PUD, GERD  Medicated,  Endo/Other  negative endocrine ROS  Renal/GU CRFRenal disease     Musculoskeletal   Abdominal   Peds  Hematology  (+) anemia ,   Anesthesia Other Findings   Reproductive/Obstetrics                             Anesthesia Physical Anesthesia Plan  ASA: III  Anesthesia Plan: General   Post-op Pain Management:    Induction: Inhalational  PONV Risk Score and Plan: 3  Airway Management Planned: Oral ETT  Additional Equipment: None  Intra-op Plan:   Post-operative Plan: Post-operative intubation/ventilation  Informed Consent:   Consent reviewed with POA and History available from chart only  Plan Discussed with: CRNA and Surgeon  Anesthesia Plan Comments:         Anesthesia Quick Evaluation

## 2017-03-02 NOTE — Transfer of Care (Signed)
Immediate Anesthesia Transfer of Care Note  Patient: Melissa Cochran  Procedure(s) Performed: TRACHEOSTOMY (N/A Neck)  Patient Location: [facility]  Anesthesia Type:General  Level of Consciousness: sedated and Patient remains intubated per anesthesia plan  Airway & Oxygen Therapy: Patient remains intubated per anesthesia plan and Patient placed on Ventilator (see vital sign flow sheet for setting) Trach maintained  Post-op Assessment: Report given to RN and Post -op Vital signs reviewed and stable  Post vital signs: Reviewed and stable  Last Vitals: There were no vitals filed for this visit.  Last Pain: There were no vitals filed for this visit.       Complications: No apparent anesthesia complications   Patient transported to ICU with standard monitors (HR, BP, SPO2, RR) and emergency drugs/equipment. Controlled ventilation maintained via ambu bag. Report given to bedside RN and respiratory therapist. Pt connected to monitor and ventilator. All questions answered and vital signs stable before leaving

## 2017-03-02 NOTE — Brief Op Note (Signed)
02/22/2017 - 03/02/2017  2:13 PM  PATIENT:  Melissa Cochran  81 y.o. female  PRE-OPERATIVE DIAGNOSIS:  prolonged intubation  POST-OPERATIVE DIAGNOSIS:  prolonged intubation  PROCEDURE:  Procedure(s): TRACHEOSTOMY (N/A) #6 Shiley Cuffed  SURGEON:  Surgeon(s) and Role:    Rozetta Nunnery, MD - Primary  PHYSICIAN ASSISTANT:   ASSISTANTS: none   ANESTHESIA:   general  EBL:  minimal   BLOOD ADMINISTERED:YES  DRAINS: none   LOCAL MEDICATIONS USED:  XYLOCAINE with EPI 4 cc  SPECIMEN:  No Specimen  DISPOSITION OF SPECIMEN:  N/A  COUNTS:  YES  TOURNIQUET:  * No tourniquets in log *  DICTATION: .Other Dictation: Dictation Number [id number]  PLAN OF CARE: Discharge to home after PACU  PATIENT DISPOSITION:  PACU - hemodynamically stable.   Delay start of Pharmacological VTE agent (>24hrs) due to surgical blood loss or risk of bleeding: yes

## 2017-03-03 ENCOUNTER — Encounter (HOSPITAL_COMMUNITY): Payer: Self-pay | Admitting: Otolaryngology

## 2017-03-03 NOTE — Op Note (Signed)
NAME:  Melissa Cochran, PAWLICKI               ACCOUNT NO.:  MEDICAL RECORD NO.:  [id number]  LOCATION:                                 FACILITY:  PHYSICIAN:  Leonides Sake. Lucia Gaskins, M.D.DATE OF BIRTH:  [birth year]  DATE OF PROCEDURE:  03/02/2017 DATE OF DISCHARGE:                              OPERATIVE REPORT   PREOPERATIVE DIAGNOSIS:  Acute on chronic respiratory failure.  POSTOPERATIVE DIAGNOSIS:  Acute on chronic respiratory failure.  PROCEDURE PERFORMED:  Tracheotomy with a #6 Shiley cuffed.  SURGEON:  Leonides Sake. Lucia Gaskins, M.D.  ANESTHESIA:  General endotracheal.  COMPLICATIONS:  None.  ESTIMATED BLOOD LOSS:  Minimal.  BRIEF CLINICAL NOTE:  Haani Bakula is a 81 year old female who was recently being diagnosed with encephalitis, was admitted to outside hospital with mental status changes and seizures and was intubated. Attempts of extubation were unsuccessful.  The patient was subsequently transferred to [facility] about a week ago on the ventilator and intubated.  It was recommended that she undergo a tracheotomy and the family was agreeable.  She was taken to the operating room this time for a tracheotomy.  DESCRIPTION OF PROCEDURE:  The patient was brought straight down from [facility] to the operating room.  She was remained in her bed in a supine position.  A roll was placed underneath the shoulders to extend her neck.  A planned incision site was marked and injected with 4-5 mL of Xylocaine with epinephrine.  A vertical incision was made straight down through the subcutaneous tissue using cautery. Strap muscles were identified and were divided in midline and retracted laterally.  The cricoid cartilage and the first 3 tracheal rings were identified.  A horizontal tracheotomy was performed between the second and third tracheal rings.  It was elected to use a #6 cuffed Shiley trach.  This was inserted via the tracheotomy after removing  the endotracheal tube.  The patient was ventilated well with the new tracheotomy.  The trach was secured to the neck with 2-0 silk sutures x4 and Velcro trach collar around the neck.  The patient had minimal bleeding.         ______________________________ Leonides Sake. Lucia Gaskins, M.D.    CEN/MEDQ  D:  03/02/2017  T:  03/02/2017  Job:  [id number]

## 2017-03-03 NOTE — Anesthesia Postprocedure Evaluation (Signed)
Anesthesia Post Note  Patient: Melissa Cochran  Procedure(s) Performed: TRACHEOSTOMY (N/A Neck)     Patient location during evaluation: ICU Anesthesia Type: General Level of consciousness: sedated Pain management: pain level controlled Vital Signs Assessment: post-procedure vital signs reviewed and stable Respiratory status: patient remains intubated per anesthesia plan and patient connected to tracheostomy mask oxygen Cardiovascular status: stable Postop Assessment: no apparent nausea or vomiting Anesthetic complications: no    Last Vitals: There were no vitals filed for this visit.  Last Pain: There were no vitals filed for this visit.               Zeta Bucy

## 2017-03-04 LAB — BASIC METABOLIC PANEL
ANION GAP: 11 (ref 5–15)
BUN: 93 mg/dL — ABNORMAL HIGH (ref 6–20)
CALCIUM: 9.5 mg/dL (ref 8.9–10.3)
CO2: 29 mmol/L (ref 22–32)
CREATININE: 1.16 mg/dL — AB (ref 0.44–1.00)
Chloride: 114 mmol/L — ABNORMAL HIGH (ref 101–111)
GFR, EST AFRICAN AMERICAN: 51 mL/min — AB (ref >60)
GFR, EST NON AFRICAN AMERICAN: 44 mL/min — AB (ref >60)
GLUCOSE: 98 mg/dL (ref 65–99)
Potassium: 2.9 mmol/L — ABNORMAL LOW (ref 3.5–5.1)
Sodium: 154 mmol/L — ABNORMAL HIGH (ref 135–145)

## 2017-03-05 LAB — POTASSIUM: POTASSIUM: 3.7 mmol/L (ref 3.5–5.1)

## 2017-03-09 LAB — CULTURE, FUNGUS WITHOUT SMEAR

## 2017-03-09 NOTE — Progress Notes (Signed)
CT scan reviewed with Dr. Kathlene Cote.  Patient has had prior gastric surgery for what we assume may be a bleeding ulcer.  Given her change in gastric anatomy, she would NOT be a candidate for gastrostomy tube placement by IR.  This was relayed to Dr. Owens Shark.  Sui Kasparek E 8:57 AM 03/09/2017

## 2017-03-10 ENCOUNTER — Emergency Department (HOSPITAL_COMMUNITY)
Admission: EM | Admit: 2017-03-10 | Discharge: 2017-03-10 | Disposition: A | Discharge status: Home / Self Care | Payer: Medicare Other | Attending: Emergency Medicine | Admitting: Emergency Medicine

## 2017-03-10 ENCOUNTER — Inpatient Hospital Stay
Admission: AD | Admit: 2017-03-10 | Discharge: 2017-03-23 | Disposition: A | Discharge status: Skilled Nursing Facility | Payer: Self-pay | Source: Ambulatory Visit | Attending: Internal Medicine | Admitting: Internal Medicine

## 2017-03-10 DIAGNOSIS — G931 Anoxic brain damage, not elsewhere classified: Secondary | ICD-10-CM | POA: Insufficient documentation

## 2017-03-10 DIAGNOSIS — Z0489 Encounter for examination and observation for other specified reasons: Secondary | ICD-10-CM | POA: Diagnosis not present

## 2017-03-10 DIAGNOSIS — Z87891 Personal history of nicotine dependence: Secondary | ICD-10-CM | POA: Insufficient documentation

## 2017-03-10 DIAGNOSIS — I509 Heart failure, unspecified: Secondary | ICD-10-CM | POA: Insufficient documentation

## 2017-03-10 DIAGNOSIS — I110 Hypertensive heart disease with heart failure: Secondary | ICD-10-CM | POA: Insufficient documentation

## 2017-03-10 DIAGNOSIS — J449 Chronic obstructive pulmonary disease, unspecified: Secondary | ICD-10-CM | POA: Diagnosis not present

## 2017-03-10 DIAGNOSIS — R69 Illness, unspecified: Secondary | ICD-10-CM

## 2017-03-10 DIAGNOSIS — R633 Feeding difficulties: Secondary | ICD-10-CM | POA: Diagnosis not present

## 2017-03-10 DIAGNOSIS — Z79899 Other long term (current) drug therapy: Secondary | ICD-10-CM | POA: Insufficient documentation

## 2017-03-10 DIAGNOSIS — J9600 Acute respiratory failure, unspecified whether with hypoxia or hypercapnia: Secondary | ICD-10-CM | POA: Diagnosis not present

## 2017-03-10 LAB — I-STAT CHEM 8, ED
BUN: 35 mg/dL — ABNORMAL HIGH (ref 6–20)
Calcium, Ion: 1.27 mmol/L (ref 1.15–1.40)
Chloride: 118 mmol/L — ABNORMAL HIGH (ref 101–111)
Creatinine, Ser: 0.6 mg/dL (ref 0.44–1.00)
GLUCOSE: 109 mg/dL — AB (ref 65–99)
HCT: 25 % — ABNORMAL LOW (ref 36.0–46.0)
Hemoglobin: 8.5 g/dL — ABNORMAL LOW (ref 12.0–15.0)
POTASSIUM: 3.1 mmol/L — AB (ref 3.5–5.1)
SODIUM: 161 mmol/L — AB (ref 135–145)
TCO2: 34 mmol/L — AB (ref 22–32)

## 2017-03-10 LAB — BASIC METABOLIC PANEL
ANION GAP: 8 (ref 5–15)
BUN: 37 mg/dL — AB (ref 6–20)
CALCIUM: 9.1 mg/dL (ref 8.9–10.3)
CO2: 34 mmol/L — ABNORMAL HIGH (ref 22–32)
Chloride: 118 mmol/L — ABNORMAL HIGH (ref 101–111)
Creatinine, Ser: 0.59 mg/dL (ref 0.44–1.00)
GFR calc Af Amer: >60 mL/min (ref >60)
Glucose, Bld: 92 mg/dL (ref 65–99)
POTASSIUM: 3.2 mmol/L — AB (ref 3.5–5.1)
SODIUM: 160 mmol/L — AB (ref 135–145)

## 2017-03-10 NOTE — Consult Note (Signed)
[facility] Surgery Consult Note  Melissa Cochran 1936/04/16  [id number].    Requesting MD: Ebony Hail, MD Chief Complaint/Reason for Consult: G tube  HPI:  Melissa Cochran is a 81 year-old female who presents to [facility] for evaluation [facility]. [facility] 02[facility]22/17  after a hospitalization 10/7-10/16 for altered mental status, status epilepticus, urosepsis, and meningoencephalitis resulting in respiratory failure.  Today during my exam the patient is non-verbal, opening eyes to loud voice and following commands for me with her right side only, so I am unable to obtain a recent history.   Based on chart review, the patient has a history of "stomach surgery" - unsure when the procedure occurred but CT abdomen significant for visible surgical clips around what looks like a previous partial gastrectomy with GJ anastomosis. She also has a history of TURP for bladder tumor and hysterectomy.  The patient remains a full code as of 02/21/17. She is not currently on any blood thinning medications based on her chart review.   ROS: Review of Systems  Unable to perform ROS: Medical condition   Family History  Problem Relation Age of Onset  . Stomach cancer Mother   . CVA Father   . Bladder Cancer Neg Hx   . Kidney cancer Neg Hx     Past Medical History:  Diagnosis Date  . Adnexal mass   . Anxiety   . Arthritis   . Bladder tumor   . Bleeding ulcer   . CHF (congestive heart failure) ([place])   . CHF (congestive heart failure) ([place])   . COPD (chronic obstructive pulmonary disease) ([place])   . Coronary artery disease   . GERD (gastroesophageal reflux disease)   . Heart block    left  . History of bleeding ulcers   . Hyperlipemia   . Hypertension   . Pneumonia   . Shortness of breath dyspnea     Past Surgical History:  Procedure Laterality Date  . ABDOMINAL HYSTERECTOMY    . CARDIAC CATHETERIZATION Left  10/21/2015   Procedure: Left Heart Cath and Coronary Angiography;  Surgeon: Isaias Cowman, MD;  Location: [place] [facility];  Service: Cardiovascular;  Laterality: Left;  . CARDIAC CATHETERIZATION N/A 10/21/2015   Procedure: Coronary Stent Intervention;  Surgeon: Isaias Cowman, MD;  Location: [place] [facility];  Service: Cardiovascular;  Laterality: N/A;  . CARDIAC SURGERY     cardiac cath  . CYSTOSCOPY W/ RETROGRADES Bilateral 01/20/2016   Procedure: CYSTOSCOPY WITH RETROGRADE PYELOGRAM;  Surgeon: Hollice Espy, MD;  Location: [facility];  Service: Urology;  Laterality: Bilateral;  . CYSTOSCOPY WITH STENT PLACEMENT Right 01/20/2016   Procedure: CYSTOSCOPY WITH STENT PLACEMENT;  Surgeon: Hollice Espy, MD;  Location: [facility];  Service: Urology;  Laterality: Right;  . HIP SURGERY     left  . STOMACH SURGERY    . TRACHEOSTOMY TUBE PLACEMENT N/A 03/02/2017   Procedure: TRACHEOSTOMY;  Surgeon: Rozetta Nunnery, MD;  Location: [place];  Service: ENT;  Laterality: N/A;  . TRANSURETHRAL RESECTION OF BLADDER TUMOR N/A 01/20/2016   Procedure: TRANSURETHRAL RESECTION OF BLADDER TUMOR (TURBT);  Surgeon: Hollice Espy, MD;  Location: [facility];  Service: Urology;  Laterality: N/A;    Social History:  reports that she quit smoking about 18 years ago. She has a 40.00 pack-year smoking history. She has never used smokeless tobacco. She reports that she does not drink alcohol or use drugs.  Allergies: No Known  Allergies   (Not in a hospital admission)  Blood pressure (!) 157/63, pulse 82, temperature 98.8 F (37.1 C), temperature source Axillary, resp. rate (!) 22, height 5\' 5"  (1.651 m), weight 89.4 kg (197 lb), SpO2 90 %. Physical Exam: Physical Exam  Constitutional: She appears well-developed. No distress.  HENT:  Head: Normocephalic and atraumatic.  Right Ear: External ear normal.  Left Ear: External ear normal.  Cortrak feeding tube in R nare.    Eyes: Pupils are equal,  round, and reactive to light. Right eye exhibits no discharge. Left eye exhibits no discharge. No scleral icterus.  pupils are equal  Neck: Normal range of motion. No tracheal deviation present.  Tracheostomy tube in place.   Cardiovascular: Normal rate, regular rhythm and normal heart sounds.  Exam reveals no friction rub.   Pulmonary/Chest: No stridor. No respiratory distress. She has no wheezes. She has no rales. She exhibits no tenderness.  Ventilated respirations. Coarse upper airway secretions.  Abdominal: Soft. Bowel sounds are normal. She exhibits no distension and no mass. There is no rebound and no guarding.  Facial grimace to palpation of abdomen.  Musculoskeletal: She exhibits no edema, tenderness or deformity.  Neurological:  Somnolent. Arouses to loud voice. Intermittently FC for me to open her mouth, squeezed and let go of w/ right hand, wiggled right toes, did not FC on left.   Skin: Skin is warm and dry. No rash noted. She is not diaphoretic. No erythema.  Psychiatric:  Unable to assess    Results for orders placed or performed during the hospital encounter of 03/10/17 (from the past 48 hour(s))  I-stat chem 8, ed     Status: Abnormal   Collection Time: 03/10/17  1:27 PM  Result Value Ref Range   Sodium 161 (HH) 135 - 145 mmol/L   Potassium 3.1 (L) 3.5 - 5.1 mmol/L   Chloride 118 (H) 101 - 111 mmol/L   BUN 35 (H) 6 - 20 mg/dL   Creatinine, Ser 0.60 0.44 - 1.00 mg/dL   Glucose, Bld 109 (H) 65 - 99 mg/dL   Calcium, Ion 1.27 1.15 - 1.40 mmol/L   TCO2 34 (H) 22 - 32 mmol/L   Hemoglobin 8.5 (L) 12.0 - 15.0 g/dL   HCT 25.0 (L) 36.0 - 46.0 %   Comment NOTIFIED PHYSICIAN    No results found. Assessment/Plan Convulsive status epilepticus  Respiratory failure secondary to seizures Meningoencephalitis Acute renal failure with tubular necrosis Paroxysmal atrial fibrillation PMH NSTEMI COPD  Chronic diastolic CHF  Intolerance of PO intake - not amenable to PEG tube due  to anatomy. Will consider placement of open gastrostomy, gastrojejunostomy, or jejunostomy feeding tube. Will discuss with [facility] physician for next week, Dr. Autumn Messing,  (11/5-11/9) and patient will likely require evaluation by him in ED on Monday 03/14/17. The patients labs today reveal significant hypernatremia (161) and anemia (hgb 8.5/hct 25) and would need electrolytes to be corrected prior to considering surgery.    Jill Alexanders, [facility] Surgery 03/10/2017, 2:24 PM Pager: [phone] Consults: [phone] Mon-Fri 7:00 am-4:30 pm Sat-Sun 7:00 am-11:30 am

## 2017-03-10 NOTE — ED Provider Notes (Signed)
[place] EMERGENCY DEPARTMENT Provider Note   CSN: [id number] Arrival date & time: 03/10/17  1141     History   Chief Complaint Chief Complaint  Patient presents with  . Follow-up    HPI Melissa Cochran is a 81 y.o. female.  Patient is staying in a long-term care unit.  She had significant anoxic injury from seizure.  Her physician and medical power of attorney have decided the patient needs a PEG tube.  The patient was sent to the emergency department for surgical consult about a PEG tube    The history is provided by medical records. No language interpreter was used.  Illness  This is a chronic problem. The problem occurs constantly. The problem has not changed since onset.Associated symptoms include shortness of breath. Nothing aggravates the symptoms. Nothing relieves the symptoms. Treatments tried: Patient has a trach and an nasal gastric tube. The treatment provided no relief.    Past Medical History:  Diagnosis Date  . Adnexal mass   . Anxiety   . Arthritis   . Bladder tumor   . Bleeding ulcer   . CHF (congestive heart failure) ([place])   . CHF (congestive heart failure) ([place])   . COPD (chronic obstructive pulmonary disease) ([place])   . Coronary artery disease   . GERD (gastroesophageal reflux disease)   . Heart block    left  . History of bleeding ulcers   . Hyperlipemia   . Hypertension   . Pneumonia   . Shortness of breath dyspnea     Patient Active Problem List   Diagnosis Date Noted  . Acute renal failure ([place])   . Elevated troponin I level   . Encephalitis   . Acute renal failure with tubular necrosis ([place])   . Palliative care encounter   . Pressure injury of skin 02/17/2017  . Convulsive status epilepticus ([place]) 02/13/2017  . Bilateral lower extremity edema 10/08/2016  . Atherosclerosis of both lower extremities with bilateral ulceration of midfeet ([place]) 08/24/2016  . Venous ulcer of ankle ([place]) 07/08/2016  . HTN  (hypertension), malignant 07/08/2016  . DVT (deep venous thrombosis) ([place]) 07/02/2016  . Swelling of limb 06/08/2016  . Pain in limb 06/08/2016  . Altered mental status 01/22/2016  . S/P coronary artery stent placement 10/30/2015  . Atherosclerotic heart disease of native coronary artery without angina pectoris 10/21/2015  . Preop cardiovascular exam 10/08/2015  . SOB (shortness of breath) on exertion 10/08/2015  . Malignant neoplasm of trigone of bladder ([place]) 09/29/2015  . Mass of lower lobe of left lung, incidetnal on CT 09/2015, smoker.  09/29/2015  . Other disorders of lung 09/29/2015  . Other microscopic hematuria 08/26/2015  . Kidney stone 08/26/2015  . Urge incontinence 08/26/2015  . Pneumonia 01/24/2015  . PNA (pneumonia) 01/24/2015  . Venous insufficiency of both lower extremities 12/09/2014  . Neuropathy 12/09/2014  . Essential (primary) hypertension 12/09/2014  . Polyneuropathy 12/09/2014  . Chronic venous insufficiency 12/09/2014  . Adnexal mass 11/28/2013  . History of cardiac catheterization 11/28/2013  . BP (high blood pressure) 11/28/2013  . Hyperlipidemia 11/28/2013  . Other specified postprocedural states 11/28/2013    Past Surgical History:  Procedure Laterality Date  . ABDOMINAL HYSTERECTOMY    . CARDIAC CATHETERIZATION Left 10/21/2015   Procedure: Left Heart Cath and Coronary Angiography;  Surgeon: Isaias Cowman, MD;  Location: [place] [facility];  Service: Cardiovascular;  Laterality: Left;  . CARDIAC CATHETERIZATION N/A 10/21/2015   Procedure: Coronary Stent Intervention;  Surgeon: Isaias Cowman, MD;  Location: [place] [facility];  Service: Cardiovascular;  Laterality: N/A;  . CARDIAC SURGERY     cardiac cath  . CYSTOSCOPY W/ RETROGRADES Bilateral 01/20/2016   Procedure: CYSTOSCOPY WITH RETROGRADE PYELOGRAM;  Surgeon: Hollice Espy, MD;  Location: [facility];  Service: Urology;  Laterality: Bilateral;  . CYSTOSCOPY WITH STENT PLACEMENT Right  01/20/2016   Procedure: CYSTOSCOPY WITH STENT PLACEMENT;  Surgeon: Hollice Espy, MD;  Location: [facility];  Service: Urology;  Laterality: Right;  . HIP SURGERY     left  . STOMACH SURGERY    . TRACHEOSTOMY TUBE PLACEMENT N/A 03/02/2017   Procedure: TRACHEOSTOMY;  Surgeon: Rozetta Nunnery, MD;  Location: [place];  Service: ENT;  Laterality: N/A;  . TRANSURETHRAL RESECTION OF BLADDER TUMOR N/A 01/20/2016   Procedure: TRANSURETHRAL RESECTION OF BLADDER TUMOR (TURBT);  Surgeon: Hollice Espy, MD;  Location: [facility];  Service: Urology;  Laterality: N/A;    OB History    No data available       Home Medications    Prior to Admission medications   Medication Sig Start Date End Date Taking? Authorizing Provider  acetaminophen (TYLENOL) 325 MG tablet Take 2 tablets (650 mg total) by mouth every 6 (six) hours as needed for mild pain (or Fever >/= 101). 02/22/17   Mikael Spray, NP  acetaminophen (TYLENOL) 650 MG suppository Place 1 suppository (650 mg total) rectally every 6 (six) hours as needed for mild pain (or Fever >/= 101). 02/22/17   Mikael Spray, NP  acyclovir 650 mg in dextrose 5 % 100 mL Inject 650 mg into the vein daily. 02/23/17   Mikael Spray, NP  albuterol (PROVENTIL) (2.5 MG/3ML) 0.083% nebulizer solution Take 3 mLs (2.5 mg total) by nebulization every 2 (two) hours as needed for wheezing. 02/22/17   Mikael Spray, NP  Amino Acids-Protein Hydrolys (FEEDING SUPPLEMENT, PRO-STAT SUGAR FREE 64,) LIQD Place 30 mLs into feeding tube 2 (two) times daily. 02/22/17   Mikael Spray, NP  chlorhexidine gluconate, MEDLINE KIT, (PERIDEX) 0.12 % solution 15 mLs by Mouth Rinse route 2 (two) times daily. 02/22/17   Mikael Spray, NP  hydrALAZINE (APRESOLINE) 20 MG/ML injection Inject 0.5 mLs (10 mg total) into the vein every 6 (six) hours as needed (SBP>180 or DBP>100). 02/22/17   Mikael Spray, NP  levETIRAcetam 500 mg in sodium chloride 0.9 % 100  mL Inject 500 mg into the vein every 12 (twelve) hours. 02/23/17   Mikael Spray, NP  meropenem 1 g in sodium chloride 0.9 % 100 mL Inject 1 g into the vein every 12 (twelve) hours. 02/22/17   Mikael Spray, NP  metoprolol tartrate (LOPRESSOR) 5 MG/5ML SOLN injection Inject 2.5-5 mLs (2.5-5 mg total) into the vein every 6 (six) hours as needed (for heart rate >120). 02/22/17   Mikael Spray, NP  mouth rinse LIQD solution 15 mLs by Mouth Rinse route as needed. 02/22/17   Mikael Spray, NP  Multiple Vitamin (MULTIVITAMIN) LIQD Place 15 mLs into feeding tube daily. 02/23/17   Mikael Spray, NP  Nutritional Supplements (FEEDING SUPPLEMENT, VITAL 1.5 CAL,) LIQD Place 1,000 mLs into feeding tube daily. 02/23/17   Mikael Spray, NP  ondansetron (ZOFRAN) 4 MG/2ML SOLN injection Inject 2 mLs (4 mg total) into the vein every 6 (six) hours as needed for nausea. 02/22/17   Mikael Spray, NP  pantoprazole sodium (PROTONIX) 40 mg/20 mL PACK Place 20 mLs (  40 mg total) into feeding tube daily at 12 noon. 02/23/17   Mikael Spray, NP  senna-docusate (SENOKOT-S) 8.6-50 MG tablet Place 1 tablet into feeding tube 2 (two) times daily. 02/22/17   Mikael Spray, NP  valproate 1,000 mg in dextrose 5 % 50 mL Inject 1,000 mg into the vein every 8 (eight) hours. 02/22/17   Mikael Spray, NP  Water For Irrigation, Sterile (FREE WATER) SOLN Place 100 mLs into feeding tube every 8 (eight) hours. 02/22/17   Mikael Spray, NP    Family History Family History  Problem Relation Age of Onset  . Stomach cancer Mother   . CVA Father   . Bladder Cancer Neg Hx   . Kidney cancer Neg Hx     Social History Social History  Substance Use Topics  . Smoking status: Former Smoker    Packs/day: 1.00    Years: 40.00    Quit date: 06/13/1998  . Smokeless tobacco: Never Used  . Alcohol use No     Allergies   Patient has no known allergies.   Review of  Systems Review of Systems  Unable to perform ROS: Mental status change  Respiratory: Positive for shortness of breath.      Physical Exam Updated Vital Signs BP (!) 157/63   Pulse 82   Temp 98.8 F (37.1 C) (Axillary)   Resp (!) 22   Ht '5\' 5"'$  (1.651 m)   Wt 89.4 kg (197 lb)   SpO2 90%   BMI 32.78 kg/m   Physical Exam  Constitutional: She appears well-developed.  HENT:  Head: Normocephalic.  Eyes: Conjunctivae are normal. No scleral icterus.  Neck: No thyromegaly present.  Patient has a trach  Cardiovascular: Normal rate.  Exam reveals no gallop and no friction rub.   No murmur heard. Pulmonary/Chest: No stridor. She has wheezes. She has no rales. She exhibits no tenderness.  Abdominal: She exhibits distension. There is no tenderness. There is no rebound.  Musculoskeletal: Normal range of motion. She exhibits no edema.  Lymphadenopathy:    She has no cervical adenopathy.  Neurological: She exhibits normal muscle tone. Coordination normal.  Patient not responding at all  Skin: No rash noted. No erythema.     ED Treatments / Results  Labs (all labs ordered are listed, but only abnormal results are displayed) Labs Reviewed  I-STAT CHEM 8, ED - Abnormal; Notable for the following:       Result Value   Sodium 161 (*)    Potassium 3.1 (*)    Chloride 118 (*)    BUN 35 (*)    Glucose, Bld 109 (*)    TCO2 34 (*)    Hemoglobin 8.5 (*)    HCT 25.0 (*)    All other components within normal limits    EKG  EKG Interpretation None       Radiology No results found.  Procedures Procedures (including critical care time)  Medications Ordered in ED Medications - No data to display   Initial Impression / Assessment and Plan / ED Course  I have reviewed the triage vital signs and the nursing notes.  Pertinent labs & imaging results that were available during my care of the patient were reviewed by me and considered in my medical decision making (see chart for  details).     General surgery has consulted and they stated the patient can go back to her long-term care facility.  She will once again need to see  surgery next week to determine the best course of action for her gastric tube.  Patient is hyper natriuretic and this will be addressed by her physician at the long-term facility Final Clinical Impressions(s) / ED Diagnoses   Final diagnoses:  Illness    New Prescriptions New Prescriptions   No medications on file     Milton Ferguson, MD 03/10/17 1547

## 2017-03-10 NOTE — Discharge Instructions (Signed)
Surgery will reevaluate the patient next week for possible feeding tube,   patient is hyponatremic and her physician at the long-term care will address this

## 2017-03-10 NOTE — ED Notes (Signed)
PA for surgery at bedside for consult

## 2017-03-10 NOTE — ED Triage Notes (Signed)
Pt comes from [facility] for surgical consult for possible peg tube placement.  Upon arrival to ED pt is non-verbal, responds to pain.  Pt has an NGT placed with tube feeding at 30cc/hr.  VSS.  NAD noted.

## 2017-03-11 LAB — BASIC METABOLIC PANEL
Anion gap: 7 (ref 5–15)
BUN: 33 mg/dL — ABNORMAL HIGH (ref 6–20)
CALCIUM: 8.8 mg/dL — AB (ref 8.9–10.3)
CO2: 34 mmol/L — ABNORMAL HIGH (ref 22–32)
CREATININE: 0.58 mg/dL (ref 0.44–1.00)
Chloride: 110 mmol/L (ref 101–111)
Glucose, Bld: 108 mg/dL — ABNORMAL HIGH (ref 65–99)
Potassium: 3.4 mmol/L — ABNORMAL LOW (ref 3.5–5.1)
SODIUM: 151 mmol/L — AB (ref 135–145)

## 2017-03-12 LAB — BASIC METABOLIC PANEL
ANION GAP: 9 (ref 5–15)
BUN: 25 mg/dL — ABNORMAL HIGH (ref 6–20)
CHLORIDE: 101 mmol/L (ref 101–111)
CO2: 28 mmol/L (ref 22–32)
Calcium: 8.3 mg/dL — ABNORMAL LOW (ref 8.9–10.3)
Creatinine, Ser: 0.55 mg/dL (ref 0.44–1.00)
GFR calc non Af Amer: >60 mL/min (ref >60)
Glucose, Bld: 106 mg/dL — ABNORMAL HIGH (ref 65–99)
POTASSIUM: 4 mmol/L (ref 3.5–5.1)
Sodium: 138 mmol/L (ref 135–145)

## 2017-03-12 LAB — CBC
HCT: 26.8 % — ABNORMAL LOW (ref 36.0–46.0)
HEMOGLOBIN: 8.1 g/dL — AB (ref 12.0–15.0)
MCH: 29.6 pg (ref 26.0–34.0)
MCHC: 30.2 g/dL (ref 30.0–36.0)
MCV: 97.8 fL (ref 78.0–100.0)
Platelets: 183 10*3/uL (ref 150–400)
RBC: 2.74 MIL/uL — AB (ref 3.87–5.11)
RDW: 14 % (ref 11.5–15.5)
WBC: 8.7 10*3/uL (ref 4.0–10.5)

## 2017-03-13 LAB — BASIC METABOLIC PANEL
ANION GAP: 6 (ref 5–15)
BUN: 18 mg/dL (ref 6–20)
CALCIUM: 8.2 mg/dL — AB (ref 8.9–10.3)
CO2: 30 mmol/L (ref 22–32)
CREATININE: 0.46 mg/dL (ref 0.44–1.00)
Chloride: 99 mmol/L — ABNORMAL LOW (ref 101–111)
GFR calc Af Amer: >60 mL/min (ref >60)
GLUCOSE: 118 mg/dL — AB (ref 65–99)
Potassium: 3.2 mmol/L — ABNORMAL LOW (ref 3.5–5.1)
Sodium: 135 mmol/L (ref 135–145)

## 2017-03-14 LAB — BASIC METABOLIC PANEL
ANION GAP: 6 (ref 5–15)
BUN: 13 mg/dL (ref 6–20)
CALCIUM: 8.6 mg/dL — AB (ref 8.9–10.3)
CO2: 33 mmol/L — ABNORMAL HIGH (ref 22–32)
CREATININE: 0.47 mg/dL (ref 0.44–1.00)
Chloride: 102 mmol/L (ref 101–111)
GFR calc Af Amer: >60 mL/min (ref >60)
GLUCOSE: 88 mg/dL (ref 65–99)
Potassium: 3.6 mmol/L (ref 3.5–5.1)
Sodium: 141 mmol/L (ref 135–145)

## 2017-03-16 ENCOUNTER — Other Ambulatory Visit (HOSPITAL_COMMUNITY): Payer: Self-pay

## 2017-03-19 LAB — BASIC METABOLIC PANEL
ANION GAP: 10 (ref 5–15)
BUN: 14 mg/dL (ref 6–20)
CO2: 28 mmol/L (ref 22–32)
Calcium: 8.6 mg/dL — ABNORMAL LOW (ref 8.9–10.3)
Chloride: 102 mmol/L (ref 101–111)
Creatinine, Ser: 0.44 mg/dL (ref 0.44–1.00)
GFR calc Af Amer: >60 mL/min (ref >60)
Glucose, Bld: 88 mg/dL (ref 65–99)
POTASSIUM: 3.3 mmol/L — AB (ref 3.5–5.1)
SODIUM: 140 mmol/L (ref 135–145)

## 2017-03-19 LAB — CBC
HEMATOCRIT: 28.7 % — AB (ref 36.0–46.0)
HEMOGLOBIN: 8.7 g/dL — AB (ref 12.0–15.0)
MCH: 29.4 pg (ref 26.0–34.0)
MCHC: 30.3 g/dL (ref 30.0–36.0)
MCV: 97 fL (ref 78.0–100.0)
Platelets: 325 10*3/uL (ref 150–400)
RBC: 2.96 MIL/uL — ABNORMAL LOW (ref 3.87–5.11)
RDW: 15.1 % (ref 11.5–15.5)
WBC: 7.4 10*3/uL (ref 4.0–10.5)

## 2017-03-20 LAB — BASIC METABOLIC PANEL
Anion gap: 8 (ref 5–15)
BUN: 13 mg/dL (ref 6–20)
CALCIUM: 8.8 mg/dL — AB (ref 8.9–10.3)
CHLORIDE: 101 mmol/L (ref 101–111)
CO2: 33 mmol/L — AB (ref 22–32)
CREATININE: 0.47 mg/dL (ref 0.44–1.00)
GFR calc Af Amer: >60 mL/min (ref >60)
GFR calc non Af Amer: >60 mL/min (ref >60)
Glucose, Bld: 77 mg/dL (ref 65–99)
Potassium: 2.9 mmol/L — ABNORMAL LOW (ref 3.5–5.1)
Sodium: 142 mmol/L (ref 135–145)

## 2017-03-21 LAB — POTASSIUM: POTASSIUM: 3.7 mmol/L (ref 3.5–5.1)

## 2017-03-23 DIAGNOSIS — J9690 Respiratory failure, unspecified, unspecified whether with hypoxia or hypercapnia: Secondary | ICD-10-CM | POA: Diagnosis not present

## 2017-03-23 DIAGNOSIS — R262 Difficulty in walking, not elsewhere classified: Secondary | ICD-10-CM | POA: Diagnosis not present

## 2017-03-23 DIAGNOSIS — I509 Heart failure, unspecified: Secondary | ICD-10-CM | POA: Diagnosis not present

## 2017-03-23 DIAGNOSIS — B004 Herpesviral encephalitis: Secondary | ICD-10-CM | POA: Diagnosis not present

## 2017-03-23 DIAGNOSIS — J9600 Acute respiratory failure, unspecified whether with hypoxia or hypercapnia: Secondary | ICD-10-CM | POA: Diagnosis not present

## 2017-03-23 DIAGNOSIS — J189 Pneumonia, unspecified organism: Secondary | ICD-10-CM | POA: Diagnosis not present

## 2017-03-23 DIAGNOSIS — N179 Acute kidney failure, unspecified: Secondary | ICD-10-CM | POA: Diagnosis not present

## 2017-03-23 DIAGNOSIS — L89890 Pressure ulcer of other site, unstageable: Secondary | ICD-10-CM | POA: Diagnosis not present

## 2017-03-23 DIAGNOSIS — J449 Chronic obstructive pulmonary disease, unspecified: Secondary | ICD-10-CM | POA: Diagnosis not present

## 2017-03-23 DIAGNOSIS — L89153 Pressure ulcer of sacral region, stage 3: Secondary | ICD-10-CM | POA: Diagnosis not present

## 2017-03-23 DIAGNOSIS — R1310 Dysphagia, unspecified: Secondary | ICD-10-CM | POA: Diagnosis not present

## 2017-03-23 DIAGNOSIS — I2510 Atherosclerotic heart disease of native coronary artery without angina pectoris: Secondary | ICD-10-CM | POA: Diagnosis not present

## 2017-03-23 DIAGNOSIS — L89150 Pressure ulcer of sacral region, unstageable: Secondary | ICD-10-CM | POA: Diagnosis not present

## 2017-03-23 DIAGNOSIS — J9621 Acute and chronic respiratory failure with hypoxia: Secondary | ICD-10-CM | POA: Diagnosis not present

## 2017-03-23 DIAGNOSIS — G40919 Epilepsy, unspecified, intractable, without status epilepticus: Secondary | ICD-10-CM | POA: Diagnosis not present

## 2017-03-23 DIAGNOSIS — I5040 Unspecified combined systolic (congestive) and diastolic (congestive) heart failure: Secondary | ICD-10-CM | POA: Diagnosis not present

## 2017-03-23 DIAGNOSIS — M6281 Muscle weakness (generalized): Secondary | ICD-10-CM | POA: Diagnosis not present

## 2017-03-23 DIAGNOSIS — R41841 Cognitive communication deficit: Secondary | ICD-10-CM | POA: Diagnosis not present

## 2017-03-26 DIAGNOSIS — I509 Heart failure, unspecified: Secondary | ICD-10-CM | POA: Diagnosis not present

## 2017-03-26 DIAGNOSIS — I2510 Atherosclerotic heart disease of native coronary artery without angina pectoris: Secondary | ICD-10-CM | POA: Diagnosis not present

## 2017-03-26 DIAGNOSIS — J9621 Acute and chronic respiratory failure with hypoxia: Secondary | ICD-10-CM | POA: Diagnosis not present

## 2017-04-07 DIAGNOSIS — L89890 Pressure ulcer of other site, unstageable: Secondary | ICD-10-CM | POA: Diagnosis not present

## 2017-04-14 DIAGNOSIS — L89150 Pressure ulcer of sacral region, unstageable: Secondary | ICD-10-CM | POA: Diagnosis not present

## 2017-04-14 DIAGNOSIS — L89890 Pressure ulcer of other site, unstageable: Secondary | ICD-10-CM | POA: Diagnosis not present

## 2017-04-21 DIAGNOSIS — L89153 Pressure ulcer of sacral region, stage 3: Secondary | ICD-10-CM | POA: Diagnosis not present

## 2017-04-21 DIAGNOSIS — L89890 Pressure ulcer of other site, unstageable: Secondary | ICD-10-CM | POA: Diagnosis not present

## 2017-04-28 DIAGNOSIS — S81802A Unspecified open wound, left lower leg, initial encounter: Secondary | ICD-10-CM | POA: Diagnosis not present

## 2017-04-28 DIAGNOSIS — L89153 Pressure ulcer of sacral region, stage 3: Secondary | ICD-10-CM | POA: Diagnosis not present

## 2017-04-28 DIAGNOSIS — L89890 Pressure ulcer of other site, unstageable: Secondary | ICD-10-CM | POA: Diagnosis not present

## 2017-05-04 DIAGNOSIS — N189 Chronic kidney disease, unspecified: Secondary | ICD-10-CM | POA: Diagnosis not present

## 2017-05-04 DIAGNOSIS — D649 Anemia, unspecified: Secondary | ICD-10-CM | POA: Diagnosis not present

## 2017-05-04 DIAGNOSIS — E785 Hyperlipidemia, unspecified: Secondary | ICD-10-CM | POA: Diagnosis not present

## 2017-05-04 DIAGNOSIS — Z79899 Other long term (current) drug therapy: Secondary | ICD-10-CM | POA: Diagnosis not present

## 2017-06-02 DIAGNOSIS — L89893 Pressure ulcer of other site, stage 3: Secondary | ICD-10-CM | POA: Diagnosis not present

## 2017-06-08 DIAGNOSIS — M6281 Muscle weakness (generalized): Secondary | ICD-10-CM | POA: Diagnosis not present

## 2017-06-08 DIAGNOSIS — L89890 Pressure ulcer of other site, unstageable: Secondary | ICD-10-CM | POA: Diagnosis not present

## 2017-06-16 DIAGNOSIS — L89893 Pressure ulcer of other site, stage 3: Secondary | ICD-10-CM | POA: Diagnosis not present

## 2017-06-20 ENCOUNTER — Ambulatory Visit (INDEPENDENT_AMBULATORY_CARE_PROVIDER_SITE_OTHER): Payer: Medicare Other | Admitting: Vascular Surgery

## 2017-06-25 ENCOUNTER — Emergency Department
Admission: EM | Admit: 2017-06-25 | Discharge: 2017-06-26 | Disposition: A | Discharge status: Other Acute Inpatient Hospital | Payer: Medicare Other | Attending: Emergency Medicine | Admitting: Emergency Medicine

## 2017-06-25 ENCOUNTER — Emergency Department: Payer: Medicare Other

## 2017-06-25 ENCOUNTER — Other Ambulatory Visit: Payer: Self-pay

## 2017-06-25 DIAGNOSIS — N390 Urinary tract infection, site not specified: Secondary | ICD-10-CM | POA: Diagnosis not present

## 2017-06-25 DIAGNOSIS — G40901 Epilepsy, unspecified, not intractable, with status epilepticus: Secondary | ICD-10-CM

## 2017-06-25 DIAGNOSIS — Z79899 Other long term (current) drug therapy: Secondary | ICD-10-CM | POA: Insufficient documentation

## 2017-06-25 DIAGNOSIS — R569 Unspecified convulsions: Secondary | ICD-10-CM | POA: Diagnosis not present

## 2017-06-25 DIAGNOSIS — J449 Chronic obstructive pulmonary disease, unspecified: Secondary | ICD-10-CM | POA: Diagnosis not present

## 2017-06-25 DIAGNOSIS — A419 Sepsis, unspecified organism: Secondary | ICD-10-CM | POA: Diagnosis not present

## 2017-06-25 DIAGNOSIS — R000 Tachycardia, unspecified: Secondary | ICD-10-CM | POA: Diagnosis not present

## 2017-06-25 DIAGNOSIS — I509 Heart failure, unspecified: Secondary | ICD-10-CM | POA: Diagnosis not present

## 2017-06-25 DIAGNOSIS — Z87891 Personal history of nicotine dependence: Secondary | ICD-10-CM | POA: Insufficient documentation

## 2017-06-25 DIAGNOSIS — R918 Other nonspecific abnormal finding of lung field: Secondary | ICD-10-CM | POA: Diagnosis not present

## 2017-06-25 DIAGNOSIS — G43901 Migraine, unspecified, not intractable, with status migrainosus: Secondary | ICD-10-CM | POA: Diagnosis not present

## 2017-06-25 DIAGNOSIS — I110 Hypertensive heart disease with heart failure: Secondary | ICD-10-CM | POA: Diagnosis not present

## 2017-06-25 DIAGNOSIS — R6520 Severe sepsis without septic shock: Secondary | ICD-10-CM | POA: Diagnosis not present

## 2017-06-25 DIAGNOSIS — J9600 Acute respiratory failure, unspecified whether with hypoxia or hypercapnia: Secondary | ICD-10-CM | POA: Insufficient documentation

## 2017-06-25 DIAGNOSIS — Z4682 Encounter for fitting and adjustment of non-vascular catheter: Secondary | ICD-10-CM | POA: Diagnosis not present

## 2017-06-25 LAB — CBC
HCT: 33.8 % — ABNORMAL LOW (ref 35.0–47.0)
HEMOGLOBIN: 10.6 g/dL — AB (ref 12.0–16.0)
MCH: 26.3 pg (ref 26.0–34.0)
MCHC: 31.4 g/dL — ABNORMAL LOW (ref 32.0–36.0)
MCV: 83.6 fL (ref 80.0–100.0)
PLATELETS: 270 10*3/uL (ref 150–440)
RBC: 4.04 MIL/uL (ref 3.80–5.20)
RDW: 14.2 % (ref 11.5–14.5)
WBC: 6 10*3/uL (ref 3.6–11.0)

## 2017-06-25 LAB — URINALYSIS, ROUTINE W REFLEX MICROSCOPIC
Bilirubin Urine: NEGATIVE
Glucose, UA: NEGATIVE mg/dL
KETONES UR: NEGATIVE mg/dL
Nitrite: POSITIVE — AB
PH: 5 (ref 5.0–8.0)
PROTEIN: NEGATIVE mg/dL
SQUAMOUS EPITHELIAL / LPF: NONE SEEN
Specific Gravity, Urine: 1.017 (ref 1.005–1.030)

## 2017-06-25 LAB — CSF CELL COUNT WITH DIFFERENTIAL
EOS CSF: 0 %
Eosinophils, CSF: 0 %
Lymphs, CSF: 0 %
Lymphs, CSF: 80 %
Monocyte-Macrophage-Spinal Fluid: 0 %
Monocyte-Macrophage-Spinal Fluid: 20 %
Other Cells, CSF: 0
Other Cells, CSF: 0
RBC Count, CSF: 2 /mm3 (ref 0–3)
RBC Count, CSF: 7 /mm3 — ABNORMAL HIGH (ref 0–3)
SEGMENTED NEUTROPHILS-CSF: 0 %
SEGMENTED NEUTROPHILS-CSF: 0 %
TUBE #: 1
Tube #: 4
WBC CSF: 0 /mm3 (ref 0–5)
WBC, CSF: 2 /mm3 (ref 0–5)

## 2017-06-25 LAB — LACTIC ACID, PLASMA: Lactic Acid, Venous: 2.2 mmol/L (ref 0.5–1.9)

## 2017-06-25 LAB — LIPASE, BLOOD: LIPASE: 23 U/L (ref 11–51)

## 2017-06-25 LAB — BLOOD GAS, ARTERIAL
Acid-Base Excess: 2.4 mmol/L — ABNORMAL HIGH (ref 0.0–2.0)
BICARBONATE: 28.3 mmol/L — AB (ref 20.0–28.0)
FIO2: 0.35
MECHANICAL RATE: 12
MECHVT: 450 mL
O2 Saturation: 95.5 %
PEEP: 5 cmH2O
Patient temperature: 37
pCO2 arterial: 49 mmHg — ABNORMAL HIGH (ref 32.0–48.0)
pH, Arterial: 7.37 (ref 7.350–7.450)
pO2, Arterial: 81 mmHg — ABNORMAL LOW (ref 83.0–108.0)

## 2017-06-25 LAB — PROTIME-INR
INR: 1.18
Prothrombin Time: 14.9 seconds (ref 11.4–15.2)

## 2017-06-25 LAB — COMPREHENSIVE METABOLIC PANEL
ALT: 13 U/L — ABNORMAL LOW (ref 14–54)
ANION GAP: 8 (ref 5–15)
AST: 31 U/L (ref 15–41)
Albumin: 3.2 g/dL — ABNORMAL LOW (ref 3.5–5.0)
Alkaline Phosphatase: 72 U/L (ref 38–126)
BILIRUBIN TOTAL: 0.7 mg/dL (ref 0.3–1.2)
BUN: 24 mg/dL — AB (ref 6–20)
CALCIUM: 9.3 mg/dL (ref 8.9–10.3)
CO2: 29 mmol/L (ref 22–32)
Chloride: 103 mmol/L (ref 101–111)
Creatinine, Ser: 0.41 mg/dL — ABNORMAL LOW (ref 0.44–1.00)
GFR calc Af Amer: >60 mL/min (ref >60)
Glucose, Bld: 113 mg/dL — ABNORMAL HIGH (ref 65–99)
POTASSIUM: 4.7 mmol/L (ref 3.5–5.1)
Sodium: 140 mmol/L (ref 135–145)
Total Protein: 6.5 g/dL (ref 6.5–8.1)

## 2017-06-25 LAB — PROCALCITONIN: Procalcitonin: <0.1 ng/mL

## 2017-06-25 LAB — INFLUENZA PANEL BY PCR (TYPE A & B)
INFLAPCR: NEGATIVE
INFLBPCR: NEGATIVE

## 2017-06-25 LAB — PROTEIN AND GLUCOSE, CSF
GLUCOSE CSF: 66 mg/dL (ref 40–70)
Total  Protein, CSF: 18 mg/dL (ref 15–45)

## 2017-06-25 LAB — VALPROIC ACID LEVEL: VALPROIC ACID LVL: 37 ug/mL — AB (ref 50.0–100.0)

## 2017-06-25 LAB — TROPONIN I: Troponin I: <0.03 ng/mL (ref <0.03)

## 2017-06-25 MED ORDER — SODIUM CHLORIDE 0.9 % IV SOLN
5.0000 mg/h | INTRAVENOUS | Status: DC
  Administered 2017-06-25: 5 mg/h via INTRAVENOUS
  Filled 2017-06-25: qty 10

## 2017-06-25 MED ORDER — SODIUM CHLORIDE 0.9 % IV SOLN
1.0000 g | Freq: Three times a day (TID) | INTRAVENOUS | Status: DC
Start: 2017-06-25 — End: 2017-06-26
  Administered 2017-06-25: 1 g via INTRAVENOUS
  Filled 2017-06-25 (×3): qty 1

## 2017-06-25 MED ORDER — SODIUM CHLORIDE 0.9 % IV SOLN
1000.0000 mg | Freq: Once | INTRAVENOUS | Status: AC
  Administered 2017-06-25: 1000 mg via INTRAVENOUS
  Filled 2017-06-25: qty 10

## 2017-06-25 MED ORDER — SODIUM CHLORIDE 0.9 % IV SOLN: 1000.0000 mg | Freq: Once | INTRAVENOUS | Status: DC

## 2017-06-25 MED ORDER — DEXTROSE 5 % IV SOLN
10.0000 mg/kg | Freq: Once | INTRAVENOUS | Status: AC
  Administered 2017-06-25: 885 mg via INTRAVENOUS
  Filled 2017-06-25: qty 17.7

## 2017-06-25 MED ORDER — DEXTROSE 5 % IV SOLN
500.0000 mg | Freq: Once | INTRAVENOUS | Status: AC
  Administered 2017-06-25: 500 mg via INTRAVENOUS
  Filled 2017-06-25: qty 5

## 2017-06-25 MED ORDER — ROCURONIUM BROMIDE 50 MG/5ML IV SOLN
INTRAVENOUS | Status: AC | PRN
  Administered 2017-06-25: 70 mg via INTRAVENOUS

## 2017-06-25 MED ORDER — HYDROMORPHONE HCL 1 MG/ML IJ SOLN
INTRAMUSCULAR | Status: AC
  Administered 2017-06-25: 2 mg via INTRAVENOUS
  Filled 2017-06-25: qty 2

## 2017-06-25 MED ORDER — SODIUM CHLORIDE 0.9 % IV BOLUS (SEPSIS)
2000.0000 mL | Freq: Once | INTRAVENOUS | Status: AC
  Administered 2017-06-25: 2000 mL via INTRAVENOUS

## 2017-06-25 MED ORDER — SODIUM CHLORIDE 0.9 % IV SOLN
1000.0000 mg | Freq: Once | INTRAVENOUS | Status: AC
  Administered 2017-06-25: 1000 mg via INTRAVENOUS
  Filled 2017-06-25: qty 20

## 2017-06-25 MED ORDER — KETAMINE HCL 10 MG/ML IJ SOLN
INTRAMUSCULAR | Status: AC | PRN
  Administered 2017-06-25: 100 mg via INTRAVENOUS

## 2017-06-25 MED ORDER — DOPAMINE-DEXTROSE 1.6-5 MG/ML-% IV SOLN: 2.0000 ug/kg/min | Freq: Once | INTRAVENOUS | Status: DC

## 2017-06-25 MED ORDER — VANCOMYCIN HCL 10 G IV SOLR
1250.0000 mg | INTRAVENOUS | Status: AC
  Administered 2017-06-25: 1250 mg via INTRAVENOUS
  Filled 2017-06-25: qty 1250

## 2017-06-25 MED ORDER — MIDAZOLAM HCL 5 MG/5ML IJ SOLN
5.0000 mg | Freq: Once | INTRAMUSCULAR | Status: AC
  Administered 2017-06-25: 5 mg via INTRAVENOUS

## 2017-06-25 MED ORDER — LEVETIRACETAM IN NACL 1000 MG/100ML IV SOLN: 1000.0000 mg | Freq: Once | INTRAVENOUS | Status: DC

## 2017-06-25 MED ORDER — DOPAMINE-DEXTROSE 3.2-5 MG/ML-% IV SOLN
0.0000 ug/kg/min | Freq: Once | INTRAVENOUS | Status: AC
  Administered 2017-06-25: 10 ug/kg/min via INTRAVENOUS

## 2017-06-25 MED ORDER — HYDROMORPHONE HCL 1 MG/ML IJ SOLN
2.0000 mg | Freq: Once | INTRAMUSCULAR | Status: AC
  Administered 2017-06-25: 2 mg via INTRAVENOUS

## 2017-06-25 MED ORDER — SODIUM CHLORIDE 0.9 % IV SOLN
2.0000 g | Freq: Two times a day (BID) | INTRAVENOUS | Status: DC
  Administered 2017-06-25: 2 g via INTRAVENOUS
  Filled 2017-06-25 (×2): qty 20

## 2017-06-25 MED ORDER — LORAZEPAM 2 MG/ML IJ SOLN
2.0000 mg | Freq: Once | INTRAMUSCULAR | Status: AC
Start: 2017-06-25 — End: 2017-06-25
  Administered 2017-06-25: 2 mg via INTRAVENOUS

## 2017-06-25 MED ORDER — DOPAMINE-DEXTROSE 3.2-5 MG/ML-% IV SOLN
INTRAVENOUS | Status: AC
  Administered 2017-06-25: 10 ug/kg/min via INTRAVENOUS
  Filled 2017-06-25: qty 250

## 2017-06-25 MED ORDER — SODIUM CHLORIDE 0.9 % IV SOLN
800.0000 mg | Freq: Once | INTRAVENOUS | Status: AC
  Administered 2017-06-25: 800 mg via INTRAVENOUS
  Filled 2017-06-25: qty 16

## 2017-06-25 NOTE — Progress Notes (Signed)
Patient intubated by ED MD. Placed on vent, see flowsheet. Patient transported to CT without incident

## 2017-06-25 NOTE — ED Triage Notes (Addendum)
Pt arrives [facility] emergency traffic for seizure like activity for 45 min PTA. Arrives with 20 R hand. Received 4mg  versed en route with last administration at 8:22p. Continues to have seizure like activity. CBG 152. Takes keppra. From [place]. Initial 02 80%, arrived on 4 L nasal cannula. Diminished lung sounds. PERRL. Per EMS last known well was 6:30p today.   EMS VS 129/72. HR 110

## 2017-06-25 NOTE — ED Notes (Signed)
E-link called regarding needing repeat lactic drawn, currently all pt's IV access in use w/ infusions, IVs located in hands, unable to straight stick distal IV sites.  E-link informed of limitations.

## 2017-06-25 NOTE — ED Notes (Signed)
carelink at bedside 

## 2017-06-25 NOTE — ED Provider Notes (Addendum)
[facility] Emergency Department Provider Note  ____________________________________________   First MD Initiated Contact with Patient 06/25/17 2047     (approximate)  I have reviewed the triage vital signs and the nursing notes.   HISTORY  Chief Complaint Seizures  Level 5 exemption history limited by the patient's clinical condition  HPI Melissa Cochran is a 82 y.o. female who comes to the emergency department via EMS with 40 minutes of seizures.  The patient is coming from her nursing home where apparently she began to seize.  According to EMS the patient does have a past medical history of seizures for which she takes Keppra.  She has not had a seizure in the past several months.  Normal blood sugar in route.  EMS gave a total of 4 mg of midozalam en route.  Past Medical History:  Diagnosis Date  . Adnexal mass   . Anxiety   . Arthritis   . Bladder tumor   . Bleeding ulcer   . CHF (congestive heart failure) ([place])   . CHF (congestive heart failure) ([place])   . COPD (chronic obstructive pulmonary disease) ([place])   . Coronary artery disease   . GERD (gastroesophageal reflux disease)   . Heart block    left  . History of bleeding ulcers   . Hyperlipemia   . Hypertension   . Pneumonia   . Shortness of breath dyspnea     Patient Active Problem List   Diagnosis Date Noted  . Acute renal failure ([place])   . Elevated troponin I level   . Encephalitis   . Acute renal failure with tubular necrosis ([place])   . Palliative care encounter   . Pressure injury of skin 02/17/2017  . 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 Bilateral lower extremity edema 10/08/2016  . Atherosclerosis of both lower extremities with bilateral ulceration of midfeet ([place]) 08/24/2016  . Venous ulcer of ankle ([place]) 07/08/2016  . HTN (hypertension), malignant 07/08/2016  . DVT (deep venous thrombosis) ([place]) 07/02/2016  . Swelling of limb 06/08/2016  . Pain in limb  06/08/2016  . Altered mental status 01/22/2016  . S/P coronary artery stent placement 10/30/2015  . Atherosclerotic heart disease of native coronary artery without angina pectoris 10/21/2015  . Preop cardiovascular exam 10/08/2015  . SOB (shortness of breath) on exertion 10/08/2015  . Malignant neoplasm of trigone of bladder ([place]) 09/29/2015  . Mass of lower lobe of left lung, incidetnal on CT 09/2015, smoker.  09/29/2015  . Other disorders of lung 09/29/2015  . Other microscopic hematuria 08/26/2015  . Kidney stone 08/26/2015  . Urge incontinence 08/26/2015  . Pneumonia 01/24/2015  . PNA (pneumonia) 01/24/2015  . Venous insufficiency of both lower extremities 12/09/2014  . Neuropathy 12/09/2014  . Essential (primary) hypertension 12/09/2014  . Polyneuropathy 12/09/2014  . Chronic venous insufficiency 12/09/2014  . Adnexal mass 11/28/2013  . History of cardiac catheterization 11/28/2013  . BP (high blood pressure) 11/28/2013  . Hyperlipidemia 11/28/2013  . Other specified postprocedural states 11/28/2013    Past Surgical History:  Procedure Laterality Date  . ABDOMINAL HYSTERECTOMY    . CARDIAC CATHETERIZATION Left 10/21/2015   Procedure: Left Heart Cath and Coronary Angiography;  Surgeon: Isaias Cowman, MD;  Location: [facility];  Service: Cardiovascular;  Laterality: Left;  . CARDIAC CATHETERIZATION N/A 10/21/2015   Procedure: Coronary Stent Intervention;  Surgeon: Isaias Cowman, MD;  Location: [facility];  Service: Cardiovascular;  Laterality: N/A;  . CARDIAC  SURGERY     cardiac cath  . CYSTOSCOPY W/ RETROGRADES Bilateral 01/20/2016   Procedure: CYSTOSCOPY WITH RETROGRADE PYELOGRAM;  Surgeon: Hollice Espy, MD;  Location: [facility];  Service: Urology;  Laterality: Bilateral;  . CYSTOSCOPY WITH STENT PLACEMENT Right 01/20/2016   Procedure: CYSTOSCOPY WITH STENT PLACEMENT;  Surgeon: Hollice Espy, MD;  Location: [facility];  Service: Urology;   Laterality: Right;  . HIP SURGERY     left  . STOMACH SURGERY    . TRACHEOSTOMY TUBE PLACEMENT N/A 03/02/2017   Procedure: TRACHEOSTOMY;  Surgeon: Rozetta Nunnery, MD;  Location: [facility];  Service: ENT;  Laterality: N/A;  . TRANSURETHRAL RESECTION OF BLADDER TUMOR N/A 01/20/2016   Procedure: TRANSURETHRAL RESECTION OF BLADDER TUMOR (TURBT);  Surgeon: Hollice Espy, MD;  Location: [facility];  Service: Urology;  Laterality: N/A;    Prior to Admission medications   Medication Sig Start Date End Date Taking? Authorizing Provider  acetaminophen (TYLENOL) 325 MG tablet Take 2 tablets (650 mg total) by mouth every 6 (six) hours as needed for mild pain (or Fever >/= 101). 02/22/17   Mikael Spray, NP  acetaminophen (TYLENOL) 650 MG suppository Place 1 suppository (650 mg total) rectally every 6 (six) hours as needed for mild pain (or Fever >/= 101). 02/22/17   Mikael Spray, NP  acyclovir 650 mg in dextrose 5 % 100 mL Inject 650 mg into the vein daily. 02/23/17   Mikael Spray, NP  albuterol (PROVENTIL) (2.5 MG/3ML) 0.083% nebulizer solution Take 3 mLs (2.5 mg total) by nebulization every 2 (two) hours as needed for wheezing. 02/22/17   Mikael Spray, NP  Amino Acids-Protein Hydrolys (FEEDING SUPPLEMENT, PRO-STAT SUGAR FREE 64,) LIQD Place 30 mLs into feeding tube 2 (two) times daily. 02/22/17   Mikael Spray, NP  chlorhexidine gluconate, MEDLINE KIT, (PERIDEX) 0.12 % solution 15 mLs by Mouth Rinse route 2 (two) times daily. 02/22/17   Mikael Spray, NP  hydrALAZINE (APRESOLINE) 20 MG/ML injection Inject 0.5 mLs (10 mg total) into the vein every 6 (six) hours as needed (SBP>180 or DBP>100). 02/22/17   Mikael Spray, NP  levETIRAcetam 500 mg in sodium chloride 0.9 % 100 mL Inject 500 mg into the vein every 12 (twelve) hours. 02/23/17   Mikael Spray, NP  meropenem 1 g in sodium chloride 0.9 % 100 mL Inject 1 g into the vein every 12 (twelve) hours.  02/22/17   Mikael Spray, NP  metoprolol tartrate (LOPRESSOR) 5 MG/5ML SOLN injection Inject 2.5-5 mLs (2.5-5 mg total) into the vein every 6 (six) hours as needed (for heart rate >120). 02/22/17   Mikael Spray, NP  mouth rinse LIQD solution 15 mLs by Mouth Rinse route as needed. 02/22/17   Mikael Spray, NP  Multiple Vitamin (MULTIVITAMIN) LIQD Place 15 mLs into feeding tube daily. 02/23/17   Mikael Spray, NP  Nutritional Supplements (FEEDING SUPPLEMENT, VITAL 1.5 CAL,) LIQD Place 1,000 mLs into feeding tube daily. 02/23/17   Mikael Spray, NP  ondansetron (ZOFRAN) 4 MG/2ML SOLN injection Inject 2 mLs (4 mg total) into the vein every 6 (six) hours as needed for nausea. 02/22/17   Mikael Spray, NP  pantoprazole sodium (PROTONIX) 40 mg/20 mL PACK Place 20 mLs (40 mg total) into feeding tube daily at 12 noon. 02/23/17   Mikael Spray, NP  senna-docusate (SENOKOT-S) 8.6-50 MG tablet Place 1 tablet into feeding tube 2 (two) times daily. 02/22/17   Dorene Sorrow  S, NP  valproate 1,000 mg in dextrose 5 % 50 mL Inject 1,000 mg into the vein every 8 (eight) hours. 02/22/17   Mikael Spray, NP  Water For Irrigation, Sterile (FREE WATER) SOLN Place 100 mLs into feeding tube every 8 (eight) hours. 02/22/17   Mikael Spray, NP    Allergies Patient has no known allergies.  Family History  Problem Relation Age of Onset  . Stomach cancer Mother   . CVA Father   . Bladder Cancer Neg Hx   . Kidney cancer Neg Hx     Social History Social History   Tobacco Use  . Smoking status: Former Smoker    Packs/day: 1.00    Years: 40.00    Pack years: 40.00    Last attempt to quit: 06/13/1998    Years since quitting: 19.0  . Smokeless tobacco: Never Used  Substance Use Topics  . Alcohol use: No  . Drug use: No    Review of Systems Level 5 exemption history limited by the patient's clinical  condition  ____________________________________________   PHYSICAL EXAM:  VITAL SIGNS: ED Triage Vitals  Enc Vitals Group     BP 06/25/17 2041 (!) 164/86     Pulse Rate 06/25/17 2041 (!) 119     Resp 06/25/17 2041 (!) 32     Temp 06/25/17 2046 100.1 F (37.8 C)     Temp Source 06/25/17 2046 Rectal     SpO2 06/25/17 2041 100 %     Weight 06/25/17 2044 195 lb (88.5 kg)     Height --      Head Circumference --      Peak Flow --      Pain Score --      Pain Loc --      Pain Edu? --      Excl. in [place]? --     Constitutional: Actively seizing deviated to the left sonorous respirations appears critically ill Eyes: Eyes deviated to the left pupils midrange and round Head: Atraumatic. Nose: No congestion/rhinnorhea. Mouth/Throat: No trismus Neck: Trachea midline Cardiovascular: Tachycardic and irregularly irregular Respiratory: Moderate respiratory distress with sonorous respirations and actively aspirating Gastrointestinal: Soft nontender Musculoskeletal: No lower extremity edema   Neurologic:Actively seizing Skin: Skin feels extremely warm Psychiatric: Comatose    ____________________________________________   DIFFERENTIAL includes but not limited to  Status epilepticus, hyponatremia, meningitis, encephalitis ____________________________________________   LABS (all labs ordered are listed, but only abnormal results are displayed)  Labs Reviewed  CBC - Abnormal; Notable for the following components:      Result Value   Hemoglobin 10.6 (*)    HCT 33.8 (*)    MCHC 31.4 (*)    All other components within normal limits  LACTIC ACID, PLASMA - Abnormal; Notable for the following components:   Lactic Acid, Venous 2.2 (*)    All other components within normal limits  COMPREHENSIVE METABOLIC PANEL - Abnormal; Notable for the following components:   Glucose, Bld 113 (*)    BUN 24 (*)    Creatinine, Ser 0.41 (*)    Albumin 3.2 (*)    ALT 13 (*)    All other components  within normal limits  URINALYSIS, ROUTINE W REFLEX MICROSCOPIC - Abnormal; Notable for the following components:   Color, Urine YELLOW (*)    APPearance HAZY (*)    Hgb urine dipstick MODERATE (*)    Nitrite POSITIVE (*)    Leukocytes, UA SMALL (*)    Bacteria, UA  MANY (*)    All other components within normal limits  VALPROIC ACID LEVEL - Abnormal; Notable for the following components:   Valproic Acid Lvl 37 (*)    All other components within normal limits  BLOOD GAS, ARTERIAL - Abnormal; Notable for the following components:   pCO2 arterial 49 (*)    pO2, Arterial 81 (*)    Bicarbonate 28.3 (*)    Acid-Base Excess 2.4 (*)    All other components within normal limits  CULTURE, BLOOD (ROUTINE X 2)  CULTURE, BLOOD (ROUTINE X 2)  URINE CULTURE  CSF CULTURE  LIPASE, BLOOD  TROPONIN I  PROCALCITONIN  INFLUENZA PANEL BY PCR (TYPE A & B)  PROTIME-INR  PROTEIN AND GLUCOSE, CSF  LACTIC ACID, PLASMA  CSF CELL COUNT WITH DIFFERENTIAL  CSF CELL COUNT WITH DIFFERENTIAL  CBG MONITORING, ED    Lab work reviewed by me with a number of abnormalities.  Most notably elevated lactic acid concerning for sepsis as well as urinalysis consistent with infection __________________________________________  EKG  ED ECG REPORT I, Darel Hong, the attending physician, personally viewed and interpreted this ECG.  Date: 06/25/2017 EKG Time:  Rate: 91 Rhythm: Atrial fibrillation QRS Axis: normal Intervals: normal ST/T Wave abnormalities: normal Narrative Interpretation: no evidence of acute ischemia  ____________________________________________  RADIOLOGY  Chest x-ray reviewed by me with ET tube in adequate position Head CT reviewed by me with no acute disease shows chronic meningioma on the right ____________________________________________   PROCEDURES  Procedure(s) performed: Yes as below  .Critical Care Performed by: Darel Hong, MD Authorized by: Darel Hong, MD    Critical care provider statement:    Critical care time (minutes):  50   Critical care time was exclusive of:  Separately billable procedures and treating other patients   Critical care was necessary to treat or prevent imminent or life-threatening deterioration of the following conditions:  Respiratory failure and CNS failure or compromise   Critical care was time spent personally by me on the following activities:  Development of treatment plan with patient or surrogate, discussions with consultants, evaluation of patient's response to treatment, examination of patient, obtaining history from patient or surrogate, ordering and performing treatments and interventions, ordering and review of laboratory studies, ordering and review of radiographic studies, pulse oximetry, re-evaluation of patient's condition and review of old charts Procedure Name: Intubation Date/Time: 06/25/2017 9:10 PM Performed by: Darel Hong, MD Pre-anesthesia Checklist: Patient identified, Emergency Drugs available, Suction available and Patient being monitored Oxygen Delivery Method: Non-rebreather mask Preoxygenation: Pre-oxygenation with 100% oxygen Induction Type: IV induction and Rapid sequence Ventilation: Mask ventilation without difficulty Laryngoscope Size: Mac and 4 Grade View: Grade II Tube size: 7.5 mm Number of attempts: 1 Placement Confirmation: ETT inserted through vocal cords under direct vision,  CO2 detector and Breath sounds checked- equal and bilateral Secured at: 22 cm Tube secured with: ETT holder Dental Injury: Teeth and Oropharynx as per pre-operative assessment     .Lumbar Puncture Date/Time: 06/25/2017 9:55 PM Performed by: Darel Hong, MD Authorized by: Darel Hong, MD   Consent:    Consent obtained:  Emergent situation   Consent given by:  Healthcare agent   Risks discussed:  Headache, bleeding, infection, pain and nerve damage Pre-procedure details:    Procedure  purpose:  Diagnostic   Preparation: Patient was prepped and draped in usual sterile fashion   Anesthesia (see MAR for exact dosages):    Anesthesia method:  None Procedure details:    Patient position:  R lateral decubitus   Needle gauge:  18   Needle type:  Spinal needle - Quincke tip   Needle length (in):  3.5   Number of attempts:  1   Opening pressure (cm H2O):  29   Fluid appearance:  Clear   Tubes of fluid:  4   Total volume (ml):  8 Post-procedure:    Puncture site:  Adhesive bandage applied and direct pressure applied   Patient tolerance of procedure:  Tolerated well, no immediate complications    Critical Care performed: yes  Observation: no ____________________________________________   INITIAL IMPRESSION / ASSESSMENT AND PLAN / ED COURSE  Pertinent labs & imaging results that were available during my care of the patient were reviewed by me and considered in my medical decision making (see chart for details).  On arrival the patient was actively seizing for the last 45 minutes.  Normal blood glucose.  Had been given 4 mg of midozalam in route and we gave 2 mg of lorazepam without improvement.  The patient was clearly aspirating and decision was made to intubate.  Given 100 mg of ketamine and 70 mg of rocuronium and intubated on first pass without difficulty.  The patient felt warm and rectal temperature was 100.0 degrees after being exposed for some time raising concern for sepsis.  Meningitic dosing of ceftriaxone, acyclovir, and vancomycin are pending.  Placing the patient on a Versed drip now.    ----------------------------------------- 9:56 PM on 06/25/2017 -----------------------------------------  Head CT shows open ventricles.  Stable mass in the right temporal region.  LP performed in right lateral decubitus at L4-L5 interspace with opening pressure of 29 and clear  CSF. ____________________________________________   ----------------------------------------- 10:15 PM on 06/25/2017 -----------------------------------------  I spoke with neurologist Dr. Lorraine Lax at [facility] who recommends giving the patient her home dose of Keppra as well as Depakote and adding an extra 800 mg of Dilantin.  Call is pending to the ICU physician at [facility].   ----------------------------------------- 10:37 PM on 06/25/2017 ----------------------------------------- I spoke with intensivist at [facility] Dr. Oletta Darter who is graciously agreed to admit the patient to his service as a higher level of care transfer.  We did confirm that there are ICU beds available.   ----------------------------------------- 11:43 PM on 06/25/2017 -----------------------------------------  Now hypotensive to 50Z systolic.  Versed held and dopamine added.  Remains stable for transport.  FINAL CLINICAL IMPRESSION(S) / ED DIAGNOSES  Final diagnoses:  Status epilepticus ([place])  Sepsis, due to unspecified organism ([place])  Acute respiratory failure, unspecified whether with hypoxia or hypercapnia ([facility])  Urinary tract infection without hematuria, site unspecified      NEW MEDICATIONS STARTED DURING THIS VISIT:  New Prescriptions   No medications on file     Note:  This document was prepared using Dragon voice recognition software and may include unintentional dictation errors.     Darel Hong, MD 06/25/17 5868    Darel Hong, MD 06/25/17 2344

## 2017-06-25 NOTE — Progress Notes (Signed)
CODE SEPSIS - PHARMACY COMMUNICATION  **Broad Spectrum Antibiotics should be administered within 1 hour of Sepsis diagnosis**  Time Code Sepsis Called/Page Received: 20:45   Antibiotics Ordered:  Vanc, Ceftriaxone , Acyclovir  Time of 1st antibiotic administration:   Additional action taken by pharmacy:   Sent Rocephin 2 gm X 1 and ordered vancomycin 1250 mg IV X 1 to ED @ 21:10.  Pt is to be transferred out of [place] [facility].   If necessary, Name of Provider/Nurse Contacted:     Macklin Jacquin D ,PharmD Clinical Pharmacist  06/25/2017  9:05 PM

## 2017-06-25 NOTE — ED Notes (Signed)
Family at bedside. 

## 2017-06-25 NOTE — ED Notes (Signed)
Date and time results received: 06/25/17 2108   Test: lactic acid Critical Value: 2.2  Name of Provider Notified: Dr. Mable Paris

## 2017-06-25 NOTE — ED Notes (Signed)
Verbal order to bolus 5mg  of versed at this time. Bolus from bag.

## 2017-06-25 NOTE — ED Notes (Addendum)
Dr. Mable Paris informed of pt's low BP, 68/47, versed paused by carelink per Dr. Mable Paris recommendation

## 2017-06-26 ENCOUNTER — Inpatient Hospital Stay (HOSPITAL_COMMUNITY)
Admission: AD | Admit: 2017-06-26 | Discharge: 2017-07-08 | DRG: 100 | Disposition: A | Discharge status: Skilled Nursing Facility | Payer: Medicare Other | Source: Other Acute Inpatient Hospital | Attending: Family Medicine | Admitting: Family Medicine

## 2017-06-26 ENCOUNTER — Inpatient Hospital Stay (HOSPITAL_COMMUNITY): Payer: Medicare Other

## 2017-06-26 DIAGNOSIS — I2510 Atherosclerotic heart disease of native coronary artery without angina pectoris: Secondary | ICD-10-CM | POA: Diagnosis present

## 2017-06-26 DIAGNOSIS — G40901 Epilepsy, unspecified, not intractable, with status epilepticus: Secondary | ICD-10-CM | POA: Diagnosis not present

## 2017-06-26 DIAGNOSIS — E785 Hyperlipidemia, unspecified: Secondary | ICD-10-CM | POA: Diagnosis present

## 2017-06-26 DIAGNOSIS — A4151 Sepsis due to Escherichia coli [E. coli]: Secondary | ICD-10-CM | POA: Diagnosis not present

## 2017-06-26 DIAGNOSIS — I5020 Unspecified systolic (congestive) heart failure: Secondary | ICD-10-CM | POA: Diagnosis present

## 2017-06-26 DIAGNOSIS — J449 Chronic obstructive pulmonary disease, unspecified: Secondary | ICD-10-CM | POA: Diagnosis present

## 2017-06-26 DIAGNOSIS — Z79899 Other long term (current) drug therapy: Secondary | ICD-10-CM | POA: Diagnosis not present

## 2017-06-26 DIAGNOSIS — Z1612 Extended spectrum beta lactamase (ESBL) resistance: Secondary | ICD-10-CM | POA: Diagnosis present

## 2017-06-26 DIAGNOSIS — R41841 Cognitive communication deficit: Secondary | ICD-10-CM | POA: Diagnosis not present

## 2017-06-26 DIAGNOSIS — G40101 Localization-related (focal) (partial) symptomatic epilepsy and epileptic syndromes with simple partial seizures, not intractable, with status epilepticus: Principal | ICD-10-CM | POA: Diagnosis present

## 2017-06-26 DIAGNOSIS — J9600 Acute respiratory failure, unspecified whether with hypoxia or hypercapnia: Secondary | ICD-10-CM | POA: Diagnosis not present

## 2017-06-26 DIAGNOSIS — I4891 Unspecified atrial fibrillation: Secondary | ICD-10-CM

## 2017-06-26 DIAGNOSIS — R9401 Abnormal electroencephalogram [EEG]: Secondary | ICD-10-CM | POA: Diagnosis present

## 2017-06-26 DIAGNOSIS — Z4659 Encounter for fitting and adjustment of other gastrointestinal appliance and device: Secondary | ICD-10-CM

## 2017-06-26 DIAGNOSIS — R1310 Dysphagia, unspecified: Secondary | ICD-10-CM | POA: Diagnosis not present

## 2017-06-26 DIAGNOSIS — I110 Hypertensive heart disease with heart failure: Secondary | ICD-10-CM | POA: Diagnosis present

## 2017-06-26 DIAGNOSIS — Z9911 Dependence on respirator [ventilator] status: Secondary | ICD-10-CM

## 2017-06-26 DIAGNOSIS — D329 Benign neoplasm of meninges, unspecified: Secondary | ICD-10-CM | POA: Diagnosis present

## 2017-06-26 DIAGNOSIS — R9431 Abnormal electrocardiogram [ECG] [EKG]: Secondary | ICD-10-CM | POA: Diagnosis not present

## 2017-06-26 DIAGNOSIS — R569 Unspecified convulsions: Secondary | ICD-10-CM | POA: Diagnosis not present

## 2017-06-26 DIAGNOSIS — G9340 Encephalopathy, unspecified: Secondary | ICD-10-CM | POA: Diagnosis present

## 2017-06-26 DIAGNOSIS — R4182 Altered mental status, unspecified: Secondary | ICD-10-CM | POA: Diagnosis not present

## 2017-06-26 DIAGNOSIS — I509 Heart failure, unspecified: Secondary | ICD-10-CM | POA: Diagnosis not present

## 2017-06-26 DIAGNOSIS — Z7901 Long term (current) use of anticoagulants: Secondary | ICD-10-CM

## 2017-06-26 DIAGNOSIS — E872 Acidosis: Secondary | ICD-10-CM | POA: Diagnosis present

## 2017-06-26 DIAGNOSIS — N390 Urinary tract infection, site not specified: Secondary | ICD-10-CM | POA: Diagnosis not present

## 2017-06-26 DIAGNOSIS — I361 Nonrheumatic tricuspid (valve) insufficiency: Secondary | ICD-10-CM | POA: Diagnosis not present

## 2017-06-26 DIAGNOSIS — A419 Sepsis, unspecified organism: Secondary | ICD-10-CM | POA: Diagnosis present

## 2017-06-26 DIAGNOSIS — G40909 Epilepsy, unspecified, not intractable, without status epilepticus: Secondary | ICD-10-CM

## 2017-06-26 DIAGNOSIS — J189 Pneumonia, unspecified organism: Secondary | ICD-10-CM | POA: Diagnosis not present

## 2017-06-26 DIAGNOSIS — R918 Other nonspecific abnormal finding of lung field: Secondary | ICD-10-CM | POA: Diagnosis not present

## 2017-06-26 DIAGNOSIS — Z993 Dependence on wheelchair: Secondary | ICD-10-CM | POA: Diagnosis not present

## 2017-06-26 DIAGNOSIS — I480 Paroxysmal atrial fibrillation: Secondary | ICD-10-CM | POA: Diagnosis not present

## 2017-06-26 DIAGNOSIS — M6281 Muscle weakness (generalized): Secondary | ICD-10-CM | POA: Diagnosis not present

## 2017-06-26 DIAGNOSIS — R279 Unspecified lack of coordination: Secondary | ICD-10-CM | POA: Diagnosis not present

## 2017-06-26 DIAGNOSIS — R262 Difficulty in walking, not elsewhere classified: Secondary | ICD-10-CM | POA: Diagnosis not present

## 2017-06-26 DIAGNOSIS — Z7982 Long term (current) use of aspirin: Secondary | ICD-10-CM | POA: Diagnosis not present

## 2017-06-26 DIAGNOSIS — J9601 Acute respiratory failure with hypoxia: Secondary | ICD-10-CM | POA: Diagnosis not present

## 2017-06-26 DIAGNOSIS — J9621 Acute and chronic respiratory failure with hypoxia: Secondary | ICD-10-CM | POA: Diagnosis not present

## 2017-06-26 DIAGNOSIS — B9620 Unspecified Escherichia coli [E. coli] as the cause of diseases classified elsewhere: Secondary | ICD-10-CM | POA: Diagnosis present

## 2017-06-26 DIAGNOSIS — Z4682 Encounter for fitting and adjustment of non-vascular catheter: Secondary | ICD-10-CM | POA: Diagnosis not present

## 2017-06-26 HISTORY — DX: Unspecified chronic bronchitis: J42

## 2017-06-26 HISTORY — DX: Personal history of urinary calculi: Z87.442

## 2017-06-26 HISTORY — DX: Acute embolism and thrombosis of unspecified deep veins of unspecified lower extremity: I82.409

## 2017-06-26 HISTORY — DX: Acute myocardial infarction, unspecified: I21.9

## 2017-06-26 HISTORY — DX: Unspecified convulsions: R56.9

## 2017-06-26 HISTORY — DX: Cerebral infarction, unspecified: I63.9

## 2017-06-26 LAB — BLOOD GAS, ARTERIAL
Acid-Base Excess: 0.7 mmol/L (ref 0.0–2.0)
BICARBONATE: 25.5 mmol/L (ref 20.0–28.0)
Drawn by: 51155
FIO2: 30
LHR: 15 {breaths}/min
MECHVT: 450 mL
O2 Saturation: 96.9 %
PCO2 ART: 41.9 mmHg (ref 32.0–48.0)
PEEP: 5 cmH2O
PO2 ART: 88.2 mmHg (ref 83.0–108.0)
Patient temperature: 95
pH, Arterial: 7.39 (ref 7.350–7.450)

## 2017-06-26 LAB — POCT I-STAT 3, ART BLOOD GAS (G3+)
Acid-base deficit: 2 mmol/L (ref 0.0–2.0)
Bicarbonate: 25.7 mmol/L (ref 20.0–28.0)
O2 Saturation: 94 %
TCO2: 27 mmol/L (ref 22–32)
pCO2 arterial: 50.4 mmHg — ABNORMAL HIGH (ref 32.0–48.0)
pH, Arterial: 7.303 — ABNORMAL LOW (ref 7.350–7.450)
pO2, Arterial: 71 mmHg — ABNORMAL LOW (ref 83.0–108.0)

## 2017-06-26 LAB — RESPIRATORY PANEL BY PCR
Adenovirus: NOT DETECTED
BORDETELLA PERTUSSIS-RVPCR: NOT DETECTED
CORONAVIRUS OC43-RVPPCR: NOT DETECTED
Chlamydophila pneumoniae: NOT DETECTED
Coronavirus 229E: NOT DETECTED
Coronavirus HKU1: NOT DETECTED
Coronavirus NL63: NOT DETECTED
INFLUENZA A-RVPPCR: NOT DETECTED
INFLUENZA B-RVPPCR: NOT DETECTED
METAPNEUMOVIRUS-RVPPCR: NOT DETECTED
Mycoplasma pneumoniae: NOT DETECTED
PARAINFLUENZA VIRUS 1-RVPPCR: NOT DETECTED
PARAINFLUENZA VIRUS 2-RVPPCR: NOT DETECTED
PARAINFLUENZA VIRUS 4-RVPPCR: NOT DETECTED
Parainfluenza Virus 3: NOT DETECTED
RESPIRATORY SYNCYTIAL VIRUS-RVPPCR: NOT DETECTED
Rhinovirus / Enterovirus: NOT DETECTED

## 2017-06-26 LAB — CBC
HCT: 30.8 % — ABNORMAL LOW (ref 36.0–46.0)
Hemoglobin: 9.1 g/dL — ABNORMAL LOW (ref 12.0–15.0)
MCH: 26.3 pg (ref 26.0–34.0)
MCHC: 29.5 g/dL — AB (ref 30.0–36.0)
MCV: 89 fL (ref 78.0–100.0)
PLATELETS: 259 10*3/uL (ref 150–400)
RBC: 3.46 MIL/uL — ABNORMAL LOW (ref 3.87–5.11)
RDW: 13.2 % (ref 11.5–15.5)
WBC: 7 10*3/uL (ref 4.0–10.5)

## 2017-06-26 LAB — MAGNESIUM: Magnesium: 1.7 mg/dL (ref 1.7–2.4)

## 2017-06-26 LAB — MRSA PCR SCREENING: MRSA by PCR: NEGATIVE

## 2017-06-26 LAB — BASIC METABOLIC PANEL
Anion gap: 9 (ref 5–15)
BUN: 19 mg/dL (ref 6–20)
CHLORIDE: 109 mmol/L (ref 101–111)
CO2: 24 mmol/L (ref 22–32)
CREATININE: 0.48 mg/dL (ref 0.44–1.00)
Calcium: 8.4 mg/dL — ABNORMAL LOW (ref 8.9–10.3)
GFR calc Af Amer: >60 mL/min (ref >60)
GFR calc non Af Amer: >60 mL/min (ref >60)
GLUCOSE: 118 mg/dL — AB (ref 65–99)
Potassium: 4 mmol/L (ref 3.5–5.1)
Sodium: 142 mmol/L (ref 135–145)

## 2017-06-26 LAB — LACTIC ACID, PLASMA: Lactic Acid, Venous: 2.5 mmol/L (ref 0.5–1.9)

## 2017-06-26 LAB — PHOSPHORUS: Phosphorus: 3.6 mg/dL (ref 2.5–4.6)

## 2017-06-26 MED ORDER — PHENYLEPHRINE HCL 10 MG/ML IJ SOLN
0.0000 ug/min | INTRAMUSCULAR | Status: DC
  Administered 2017-06-26: 5 ug/min via INTRAVENOUS
  Filled 2017-06-26: qty 1

## 2017-06-26 MED ORDER — SODIUM CHLORIDE 0.9 % IV SOLN: 500.0000 mg | Freq: Two times a day (BID) | INTRAVENOUS | Status: DC

## 2017-06-26 MED ORDER — HEPARIN SODIUM (PORCINE) 5000 UNIT/ML IJ SOLN
5000.0000 [IU] | Freq: Three times a day (TID) | INTRAMUSCULAR | Status: DC
  Administered 2017-06-26 – 2017-07-08 (×37): 5000 [IU] via SUBCUTANEOUS
  Filled 2017-06-26 (×38): qty 1

## 2017-06-26 MED ORDER — SODIUM CHLORIDE 0.9 % IV SOLN
0.5000 mg/h | INTRAVENOUS | Status: DC
  Administered 2017-06-26 – 2017-06-27 (×2): 2.5 mg/h via INTRAVENOUS
  Filled 2017-06-26 (×3): qty 10

## 2017-06-26 MED ORDER — FENTANYL CITRATE (PF) 100 MCG/2ML IJ SOLN
100.0000 ug | INTRAMUSCULAR | Status: DC | PRN
  Administered 2017-06-26 – 2017-07-01 (×6): 100 ug via INTRAVENOUS
  Filled 2017-06-26 (×6): qty 2

## 2017-06-26 MED ORDER — ORAL CARE MOUTH RINSE
15.0000 mL | OROMUCOSAL | Status: DC
  Administered 2017-06-26 – 2017-06-30 (×48): 15 mL via OROMUCOSAL

## 2017-06-26 MED ORDER — VALPROATE SODIUM 500 MG/5ML IV SOLN
1000.0000 mg | Freq: Three times a day (TID) | INTRAVENOUS | Status: DC
  Administered 2017-06-26 – 2017-07-06 (×31): 1000 mg via INTRAVENOUS
  Filled 2017-06-26 (×34): qty 10

## 2017-06-26 MED ORDER — ORAL CARE MOUTH RINSE: 15.0000 mL | OROMUCOSAL | Status: DC

## 2017-06-26 MED ORDER — ACETAMINOPHEN 160 MG/5ML PO SOLN
650.0000 mg | Freq: Four times a day (QID) | ORAL | Status: DC | PRN
  Administered 2017-06-29 – 2017-07-07 (×8): 650 mg via ORAL
  Filled 2017-06-26 (×8): qty 20.3

## 2017-06-26 MED ORDER — CHLORHEXIDINE GLUCONATE 0.12% ORAL RINSE (MEDLINE KIT)
15.0000 mL | Freq: Two times a day (BID) | OROMUCOSAL | Status: DC
  Administered 2017-06-26 – 2017-07-04 (×18): 15 mL via OROMUCOSAL

## 2017-06-26 MED ORDER — SODIUM CHLORIDE 0.9 % IV SOLN
INTRAVENOUS | Status: DC
  Administered 2017-06-26 – 2017-06-28 (×3): via INTRAVENOUS

## 2017-06-26 MED ORDER — MAGNESIUM SULFATE 2 GM/50ML IV SOLN
2.0000 g | Freq: Once | INTRAVENOUS | Status: AC
  Administered 2017-06-26: 2 g via INTRAVENOUS
  Filled 2017-06-26: qty 50

## 2017-06-26 MED ORDER — CHLORHEXIDINE GLUCONATE 0.12% ORAL RINSE (MEDLINE KIT): 15.0000 mL | Freq: Two times a day (BID) | OROMUCOSAL | Status: DC

## 2017-06-26 MED ORDER — SODIUM CHLORIDE 0.9 % IV SOLN
250.0000 mL | INTRAVENOUS | Status: DC | PRN
  Administered 2017-06-27 – 2017-06-30 (×3): 250 mL via INTRAVENOUS

## 2017-06-26 MED ORDER — LEVETIRACETAM IN NACL 1000 MG/100ML IV SOLN
1000.0000 mg | Freq: Two times a day (BID) | INTRAVENOUS | Status: AC
  Administered 2017-06-26 – 2017-06-30 (×10): 1000 mg via INTRAVENOUS
  Filled 2017-06-26 (×2): qty 100
  Filled 2017-06-26 (×3): qty 10
  Filled 2017-06-26: qty 100
  Filled 2017-06-26 (×3): qty 10
  Filled 2017-06-26 (×2): qty 100

## 2017-06-26 MED ORDER — FENTANYL CITRATE (PF) 100 MCG/2ML IJ SOLN
100.0000 ug | INTRAMUSCULAR | Status: DC | PRN
  Administered 2017-06-29: 100 ug via INTRAVENOUS
  Filled 2017-06-26: qty 2

## 2017-06-26 MED ORDER — PANTOPRAZOLE SODIUM 40 MG IV SOLR
40.0000 mg | Freq: Every day | INTRAVENOUS | Status: DC
  Administered 2017-06-26 – 2017-07-03 (×8): 40 mg via INTRAVENOUS
  Filled 2017-06-26 (×8): qty 40

## 2017-06-26 MED ORDER — SODIUM CHLORIDE 0.9 % IV SOLN
1.0000 g | Freq: Three times a day (TID) | INTRAVENOUS | Status: DC
  Administered 2017-06-26 – 2017-06-29 (×10): 1 g via INTRAVENOUS
  Filled 2017-06-26 (×11): qty 1

## 2017-06-26 NOTE — Procedures (Signed)
LTM-EEG Report  HISTORY: Continuous video-EEG monitoring performed for 82 year old with abnormal MRI, seizures.   ACQUISITION: International 10-20 system for electrode placement; 18 channels with additional eyes linked to ipsilateral ears and EKG. Additional T1-T2 electrodes were used. Continuous video recording obtained.   EEG NUMBER:  MEDICATIONS:  Day 1: LEV, VPA, MDZ  DAY #1: from 6546 to 0730 06/26/17    BACKGROUND: An overall low voltage continuous recording with poor spontaneous variability and reactivity. The record consisted of low voltage delta-theta activity bilaterally with sparse superimposed faster frequencies. Activity over the right temporal region was generally slower with superimposed periodic discharges as described below. There was no evidence of a posterior basic rhythm or stage II sleep architecture.  EPILEPTIFORM/PERIODIC ACTIVITY: There were continuous blunted lateralized periodic discharges (LPDs) in the right temporal region, frequency 0.5-1Hz  without ictal evolution.  SEIZURES: none EVENTS: none reported  EKG: no significant arrhythmia  SUMMARY: This was an abnormal continuous video EEG due to generalized slowing with superimposed focal slowing and periodic discharges over the right temporal region. This was consistent with a diffuse cerebral disturbance with an additional focal epileptogenic disturbance over the right temporal region. The LPDs were blunted in morphology and showed no ictal evolution. Clinical correlation with imaging and CSF advised as this pattern can be seen in focal encephalitides.

## 2017-06-26 NOTE — Progress Notes (Signed)
LTM EEG initiated. 

## 2017-06-26 NOTE — Progress Notes (Signed)
Reviewed EEG; generalized slowing, no seizure appreciated

## 2017-06-26 NOTE — Progress Notes (Signed)
LTM EEG discontinued. No skin breakdown noted. 

## 2017-06-26 NOTE — Progress Notes (Signed)
[facility] Progress Note Patient Name: Melissa Cochran DOB: Jun 08, 1935 MRN: [id number]   Date of Service  06/26/2017  HPI/Events of Note  ABG on 30%/PRVC 12/TV 450/P 5 = 7.30/50.4/71/25.7  eICU Interventions  Will order: 1. Increase PRVC rate to 15. 2. Repeat ABG at 5 AM.      Intervention Category Major Interventions: Acid-Base disturbance - evaluation and management;Respiratory failure - evaluation and management  Sommer,Steven Cornelia Copa 06/26/2017, 2:32 AM

## 2017-06-26 NOTE — Consult Note (Signed)
Neurology Consultation Reason for Consult: Status epilpeticus Referring Physician: Dr Aloha Gell   History is obtained from: chart review, EDP   HPI: SHELENE KRAGE is a 82 y.o. female past medical history of seizures on Depakote and Keppra, CHF, paroxysmal atrial fibrillation, bilateral cerebral infarcts, possible meningoencephalitis, femoral thrombosis presented to [facility] ER with status epilepticus.  According to the EDP, patient was witnessed by nurses having generalized seizure lasting more than 20 minutes. Patient was still seizing when EMS arrived and she received 4 mg of Versed which had not stop the seizure, still seizing on arrival to ED. She received 2mg  of Ativan without improvement and was intubated. Patient was also noted to be febrile ( apparently has a fever of 101 in the nursing home) and was started on broad-spectrum antibiotics.(ceftriaxone, acyclovir, vancomycin). UA was positive for UTI. She was loaded with Fosphenytoin  and started on Versed drip. He underwent lumbar puncture in [facility] ER and a head CT which showed no acute abnormality.  Was transferred to [facility] course and workup  during admission on 02/13/17  Presented with status epilepticus - intubated and sedated on Versed drip. Seen by Dr Doy Mince. Started on Keppra.    CT head: No acute intracranial abnormality identified. Stable right parietal meningioma   MRI brain: Multifocal acute abnormal signal in the brain. No associated hemorrhage, enhancement, or mass effect. Abnormal signal in the right mesial temporal lobe, right thalamus, and right cingulate gyrus is nonspecific and might be postictal, but consider Acute Herpes Encephalitis if the patient is febrile and recommend empiric antiviral treatment. Abnormal signal in the right occipital pole, posterior right parietal lobe, and both cerebellar hemispheres most resembles several acute infarcts. A chronic 2.3 cm posterior  right frontal convexity meningioma with mild regional mass effect but no associated edema is probably clinically silent. Patchy bilateral cerebral white matter and left thalamic signal abnormality, nonspecific but may be related to chronic small vessel disease  10/09 LP - RBC 5894. WBC 165 with 99% segs  10/15 EEG: GPEDS, started on Depakote   10/16 transferred to LTAC    LDJ:TTSVXB to obtain due to altered mental status.   Past Medical History:  Diagnosis Date  . Adnexal mass   . Anxiety   . Arthritis   . Bladder tumor   . Bleeding ulcer   . CHF (congestive heart failure) ([place])   . CHF (congestive heart failure) ([place])   . COPD (chronic obstructive pulmonary disease) ([place])   . Coronary artery disease   . GERD (gastroesophageal reflux disease)   . Heart block    left  . History of bleeding ulcers   . Hyperlipemia   . Hypertension   . Pneumonia   . Shortness of breath dyspnea      Family History  Problem Relation Age of Onset  . Stomach cancer Mother   . CVA Father   . Bladder Cancer Neg Hx   . Kidney cancer Neg Hx      Social History:  reports that she quit smoking about 19 years ago. She has a 40.00 pack-year smoking history. she has never used smokeless tobacco. She reports that she does not drink alcohol or use drugs.   Exam: Current vital signs: BP 97/66   Pulse 68   Temp (!) 96.1 F (35.6 C)   Resp 15   Ht 5' 4.5" (1.638 m)   Wt 76.8 kg (169 lb 5 oz)   SpO2 100%  BMI 28.61 kg/m  Vital signs in last 24 hours: Temp:  [94.6 F (34.8 C)-100.1 F (37.8 C)] 96.1 F (35.6 C) (02/17 0445) Pulse Rate:  [58-119] 68 (02/17 0445) Resp:  [12-32] 15 (02/17 0412) BP: (68-171)/(47-105) 97/66 (02/17 0445) SpO2:  [94 %-100 %] 100 % (02/17 0445) FiO2 (%):  [30 %-35 %] 30 % (02/17 0412) Weight:  [76.8 kg (169 lb 5 oz)-88.5 kg (195 lb)] 76.8 kg (169 lb 5 oz) (02/17 0100)   Physical Exam  Constitutional: Appears well-developed and well-nourished.  Psych:  unresponsive so unable to assess Eyes: No scleral injection HENT: No OP obstrucion Head: Normocephalic.  Cardiovascular: Normal rate and regular rhythm.  Respiratory: Effort normal, non-labored breathing GI: Soft.  No distension. There is no tenderness.  Skin: WDI  Neuro: exam sedated and intubated  Mental Status: Patient does not respond to verbal stimuli.  Does not respond to deep sternal rub.  Does not follow commands.  No verbalizations noted.  Cranial Nerves:  patient does not respond confrontation bilaterally, pupils right 69mm, left 34mm unreactive doll's response absent bilaterally. Gag reflex absent  Motor: Extremities flaccid throughout.  No spontaneous movement noted.  No purposeful movements noted.  Sensory: Does not respond to noxious stimuli in any extremity. Deep Tendon Reflexes:  Absent throughout. Plantars: mute Cerebellar: Unable to perform   I have reviewed labs in epic and the results pertinent to this consultation are:  I have reviewed the images obtained: Head CT, prior MRI [place] is a 82 y.o. female past medical history of seizures on Depakote and Keppra, CHF, paroxysmal atrial fibrillation, bilateral cerebral infarcts, possible meningoencephalitis, femoral thrombosis presented to [facility] ER with status epilepticus. She was intubated and received fosphenytoin 20mg /kg.LP  was performed which is not consistent for CNS infection. patientreceived broad-spectrum antibiotics for sepsis and transferred to [facility] ICU.    Status Epilepticus  Sepsis - likely from UTI   82 y.o. female past medical history of seizures on Depakote and Keppra, CHF, paroxysmal atrial fibrillation, bilateral cerebral infarcts, possible meningoencephalitis, femoral thrombosis presented to [facility] ER with status epilepticus.   Stat LTM EEG ordered  Continue Versed gtt for now, will titrate based on EEG findings Continue Home AEDs Keppra and  Valproic Acid Obtain phenytoin and VPA levels in case we need to increase AEDs Reviewed LP results, PR 18, cells 2- this is not CNS infection, d/c Acyclovir Continue to treat sepsis    Will continue to follow     This patient is neurologically critically ill due to status epilepticus.  She is at risk for significant risk of neurological worsening from worsening seizures, complications of intubation and sedation, septic shock and death. This patient's care requires constant monitoring of vital signs, hemodynamics, respiratory and cardiac monitoring, review of multiple databases, neurological assessment, discussion with family, other specialists and medical decision making of high complexity.  I spent 55 minutes of neurocritical time in the care of this patient.

## 2017-06-26 NOTE — H&P (Signed)
PULMONARY / CRITICAL CARE MEDICINE   Name: Melissa Cochran MRN: [id number] DOB: Aug 21, 1935    ADMISSION DATE:  06/26/2017 CONSULTATION DATE:  06/26/2017  REFERRING MD:  Dr. Mable Paris  CHIEF COMPLAINT:  Seizures   HISTORY OF PRESENT ILLNESS:   82 year old female with PMH of HTN, CHF, COPD, HLD, CAD, Seizures, Meningioma    Recent Hospitalization 10/7-10/16. Found unresponsive, intubated, concerning for seizures. 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 Blood cultures positive for staph, however believed to be contaminant. UTI due to ESBL E.Coli. EEG with sharp activity over the right hemisphere which suggests focal disturbance. Ultimately Trach and PEG was placed as mental status did not improve. Discharged to [facility]. Decannulation shortly after due to improvement. Went to [facility], since has been weak and wheelchair bound, able to feed self, alert and oriented.    Presented to [facility] ED on 2/16 with reported seizure activity for 45 minutes. Given 4 mg versed per EMS. Upon arrival to ED given 2 mg of Ativan without improvement. Intubated for airway protection and transferred to [facility].   PAST MEDICAL HISTORY :  She  has a past medical history of Adnexal mass, Anxiety, Arthritis, Bladder tumor, Bleeding ulcer, CHF (congestive heart failure) ([place]), CHF (congestive heart failure) ([place]), COPD (chronic obstructive pulmonary disease) ([place]), Coronary artery disease, GERD (gastroesophageal reflux disease), Heart block, History of bleeding ulcers, Hyperlipemia, Hypertension, Pneumonia, and Shortness of breath dyspnea.  PAST SURGICAL HISTORY: She  has a past surgical history that includes Cardiac surgery; Abdominal hysterectomy; Stomach surgery; Hip surgery; Cardiac catheterization (Left, 10/21/2015); Cardiac catheterization (N/A, 10/21/2015); Transurethral resection of bladder tumor (N/A, 01/20/2016); Cystoscopy w/ retrogrades (Bilateral, 01/20/2016); Cystoscopy with stent placement (Right,  01/20/2016); and Tracheostomy tube placement (N/A, 03/02/2017).  No Known Allergies  No current facility-administered medications on file prior to encounter.    Current Outpatient Medications on File Prior to Encounter  Medication Sig  . acetaminophen (TYLENOL) 325 MG tablet Take 2 tablets (650 mg total) by mouth every 6 (six) hours as needed for mild pain (or Fever >/= 101).  Marland Kitchen acetaminophen (TYLENOL) 650 MG suppository Place 1 suppository (650 mg total) rectally every 6 (six) hours as needed for mild pain (or Fever >/= 101).  Marland Kitchen acyclovir 650 mg in dextrose 5 % 100 mL Inject 650 mg into the vein daily.  Marland Kitchen albuterol (PROVENTIL) (2.5 MG/3ML) 0.083% nebulizer solution Take 3 mLs (2.5 mg total) by nebulization every 2 (two) hours as needed for wheezing.  . Amino Acids-Protein Hydrolys (FEEDING SUPPLEMENT, PRO-STAT SUGAR FREE 64,) LIQD Place 30 mLs into feeding tube 2 (two) times daily.  . chlorhexidine gluconate, MEDLINE KIT, (PERIDEX) 0.12 % solution 15 mLs by Mouth Rinse route 2 (two) times daily.  . hydrALAZINE (APRESOLINE) 20 MG/ML injection Inject 0.5 mLs (10 mg total) into the vein every 6 (six) hours as needed (SBP>180 or DBP>100).  Marland Kitchen levETIRAcetam 500 mg in sodium chloride 0.9 % 100 mL Inject 500 mg into the vein every 12 (twelve) hours.  . meropenem 1 g in sodium chloride 0.9 % 100 mL Inject 1 g into the vein every 12 (twelve) hours.  . metoprolol tartrate (LOPRESSOR) 5 MG/5ML SOLN injection Inject 2.5-5 mLs (2.5-5 mg total) into the vein every 6 (six) hours as needed (for heart rate >120).  . mouth rinse LIQD solution 15 mLs by Mouth Rinse route as needed.  . Multiple Vitamin (MULTIVITAMIN) LIQD Place 15 mLs into feeding tube daily.  . Nutritional Supplements (FEEDING SUPPLEMENT, VITAL 1.5 CAL,) LIQD Place  1,000 mLs into feeding tube daily.  . ondansetron (ZOFRAN) 4 MG/2ML SOLN injection Inject 2 mLs (4 mg total) into the vein every 6 (six) hours as needed for nausea.  . pantoprazole  sodium (PROTONIX) 40 mg/20 mL PACK Place 20 mLs (40 mg total) into feeding tube daily at 12 noon.  . senna-docusate (SENOKOT-S) 8.6-50 MG tablet Place 1 tablet into feeding tube 2 (two) times daily.  Marland Kitchen valproate 1,000 mg in dextrose 5 % 50 mL Inject 1,000 mg into the vein every 8 (eight) hours.  . Water For Irrigation, Sterile (FREE WATER) SOLN Place 100 mLs into feeding tube every 8 (eight) hours.    FAMILY HISTORY:  Her indicated that her mother is deceased. She indicated that her father is deceased. She indicated that the status of her neg hx is unknown.   SOCIAL HISTORY: She  reports that she quit smoking about 19 years ago. She has a 40.00 pack-year smoking history. she has never used smokeless tobacco. She reports that she does not drink alcohol or use drugs.  REVIEW OF SYSTEMS:   Unable to review as patient is intubated and sedated   SUBJECTIVE:   VITAL SIGNS: Pulse 85   Temp (!) 94.6 F (34.8 C)   Resp 17   Ht 5' 4.5" (1.638 m)   Wt 76.8 kg (169 lb 5 oz)   SpO2 100%   BMI 28.61 kg/m   HEMODYNAMICS:    VENTILATOR SETTINGS: Vent Mode: PRVC FiO2 (%):  [30 %-35 %] 30 % Set Rate:  [12 bmp] 12 bmp Vt Set:  [450 mL] 450 mL PEEP:  [5 cmH20] 5 cmH20 Plateau Pressure:  [24 cmH20] 24 cmH20  INTAKE / OUTPUT: No intake/output data recorded.  PHYSICAL EXAMINATION: General:  Elderly female, on vent  Neuro:  Sedated, pupils intact and pin point  HEENT:  Dry mm, ETT in place  Cardiovascular:  RRR, no MRG  Lungs:  Diminished breath sounds to bases, no wheeze/crackles  Abdomen:  Distended, active bowel sounds Musculoskeletal:  +2 BLE  Skin:  Warm, dry, intact   LABS:  BMET Recent Labs  Lab 06/25/17 2037  NA 140  K 4.7  CL 103  CO2 29  BUN 24*  CREATININE 0.41*  GLUCOSE 113*    Electrolytes Recent Labs  Lab 06/25/17 2037  CALCIUM 9.3    CBC Recent Labs  Lab 06/25/17 2037  WBC 6.0  HGB 10.6*  HCT 33.8*  PLT 270    Coag's Recent Labs  Lab  06/25/17 2104  INR 1.18    Sepsis Markers Recent Labs  Lab 06/25/17 2037  LATICACIDVEN 2.2*  PROCALCITON <0.10    ABG Recent Labs  Lab 06/25/17 2153  PHART 7.37  PCO2ART 49*  PO2ART 81*    Liver Enzymes Recent Labs  Lab 06/25/17 2037  AST 31  ALT 13*  ALKPHOS 72  BILITOT 0.7  ALBUMIN 3.2*    Cardiac Enzymes Recent Labs  Lab 06/25/17 2037  TROPONINI <0.03    Glucose No results for input(s): GLUCAP in the last 168 hours.  Imaging Dg Abdomen 1 View  Result Date: 06/25/2017 CLINICAL DATA:  NG tube placement. EXAM: ABDOMEN - 1 VIEW COMPARISON:  CT 02/21/2017 FINDINGS: Tip of the enteric tube below the diaphragm in the stomach, the side port is not well visualized but likely just beyond the gastroesophageal junction. Multiple surgical clips and enteric sutures in the upper abdomen. Left renal stone. Nonobstructive bowel gas pattern. IMPRESSION: Tip of the enteric tube  below the diaphragm in the stomach, side-port not well visualized but likely just beyond the gastroesophageal junction. Electronically Signed   By: Jeb Levering M.D.   On: 06/25/2017 21:54   Ct Head Wo Contrast  Result Date: 06/25/2017 CLINICAL DATA:  Seizure-like activity. EXAM: CT HEAD WITHOUT CONTRAST TECHNIQUE: Contiguous axial images were obtained from the base of the skull through the vertex without intravenous contrast. COMPARISON:  Head CT 02/13/2017 FINDINGS: Brain: No intraparenchymal hemorrhage or extra-axial collection. No evidence of acute cortical infarct. There is periventricular hypoattenuation compatible with chronic microvascular disease. Unchanged 2.1 cm right convexity meningioma. No abnormality of the underlying brain. Vascular: No hyperdense vessel or unexpected calcification. Skull: Normal visualized skull base, calvarium and extracranial soft tissues. Sinuses/Orbits: No sinus fluid levels or advanced mucosal thickening. No mastoid effusion. Normal orbits. IMPRESSION: 1. No acute  abnormality. 2. Unchanged right convexity meningioma without abnormality of the underlying brain. 3. Chronic small vessel disease. Electronically Signed   By: Ulyses Jarred M.D.   On: 06/25/2017 22:22   Dg Chest Port 1 View  Result Date: 06/25/2017 CLINICAL DATA:  Intubation. EXAM: PORTABLE CHEST 1 VIEW COMPARISON:  Radiograph 02/22/2017 FINDINGS: Endotracheal tube 2.9 cm from the carina. Enteric tube in place, better defined on concurrent abdominal radiograph. Mild cardiomegaly unchanged. Aortic atherosclerosis. Unchanged left basilar opacity likely pleural fluid and atelectasis. No pulmonary edema. No pneumothorax. IMPRESSION: 1. Endotracheal tube 2.9 cm from the carina.  Enteric tube in place. 2. Unchanged left basilar opacity since October 2018 likely combination of small pleural effusion and atelectasis/airspace disease. Mild cardiomegaly is unchanged. Electronically Signed   By: Jeb Levering M.D.   On: 06/25/2017 21:52   STUDIES:  CXR 2/16 > Endotracheal tube 2.9 cm from the carina. Enteric tube in place, better defined on concurrent abdominal radiograph. Mild cardiomegaly unchanged. Aortic atherosclerosis. Unchanged left basilar opacity likely pleural fluid and atelectasis. No pulmonary edema. No pneumothorax. KUB 2/16 > Tip of the enteric tube below the diaphragm in the stomach, the side port is not well visualized but likely just beyond the gastroesophageal junction. Multiple surgical clips and enteric sutures in the upper abdomen. Left renal stone. Nonobstructive bowel gas pattern. CT Head 2/16 > Brain: No intraparenchymal hemorrhage or extra-axial collection. No evidence of acute cortical infarct. There is periventricular hypoattenuation compatible with chronic microvascular disease. Unchanged 2.1 cm right convexity meningioma. No abnormality of the underlying brain. Vascular: No hyperdense vessel or unexpected calcification. Skull: Normal visualized skull base, calvarium and  extracranial soft tissues.Sinuses/Orbits: No sinus fluid levels or advanced mucosal thickening. No mastoid effusion. Normal orbits.  CULTURES: Blood 2/16 >> Urine 2/16 >> Sputum 2/16 >> CSF 2/16 >>  ANTIBIOTICS: Acyclovir 2/16  Rocephin 2/16 Vancomycin 2/16  Meropenem 2/16 >>   SIGNIFICANT EVENTS: 2/16 > Presents to ED   LINES/TUBES: ETT 2/16 >>  DISCUSSION: 82 year old female presents to ED from SNF after witnessed/uncontrolled seizure requiring intubation. Transferred to [facility] for further management.   ASSESSMENT / PLAN:  PULMONARY A: Respiratory Insufficieny in setting of seizures   H/O COPD, Recent Trach/Vent Dependence 03/2017 (Decannulation shortly after)  P:   Vent Support Trend ABG/CXR Pulmonary Hygiene   CARDIOVASCULAR A:  Urosepsis  Diastolic HF  H/O HTN, HLD, CAD P:  Cardiac Monitoring  Wean Neo to Maintain MAP >65 Hold metoprolol and hydralazine in setting of hypotension    RENAL A:   Non-Anion Gap Metabolic Acidosis with Lactic Acidosis  LA 2.2 >  P:   Trend BMP  Replace electrolytes  Trend LA  NS @ 75 ml/hr   GASTROINTESTINAL A:   SUP P:   NPO PPI  HEMATOLOGIC A:   No issues  P:  Trend CBC   INFECTIOUS A:   Urosepsis with recent ESBL E.Coli  U/A with many bacteria  PCT <.10  WBC 6.0  P:   Trend WBC and Fever Curve  Trend PCT and LA  Follow Culture Data  Received Meropenem at OSH, will place on Zosyn due to sensitives from prior stay   ENDOCRINE A:   No issues  P:   Trend Glucose   NEUROLOGIC A:   Status Ellipticus   Right Parietal Meningioma  H/O Seizures  Valproic Acid 37  P:   RASS goal: 0/-1  Neurology Consulted  EEG pending  Given Acyclovir, Rocephin, Vancomycin at OSH   Received Phenytoin load, Keppra, and, Depacon  Continue home Keppra and Depacon   FAMILY  - Updates: Family updated.   - Inter-disciplinary family meet or Palliative Care meeting due by: 07/03/2017      Hayden Pedro, AGACNP-BC [facility]  Pgr: [phone]  PCCM Pgr: [phone]

## 2017-06-26 NOTE — Progress Notes (Signed)
Pharmacy Antibiotic Note  Melissa Cochran is a 82 y.o. female admitted on 06/26/2017 with seizure.  Pharmacy has been consulted for Merrem dosing for UTI. Pt has history of multiple ESBL UTIs. WBC WNL. Renal function ok. Abnormal U/A.   Plan: Merrem 1g IV q8h Trend WBC, temp, renal function  F/U urine culture   Height: 5' 4.5" (163.8 cm) Weight: 169 lb 5 oz (76.8 kg) IBW/kg (Calculated) : 55.85  Temp (24hrs), Avg:96.6 F (35.9 C), Min:94.6 F (34.8 C), Max:100.1 F (37.8 C)  Recent Labs  Lab 06/25/17 2037  WBC 6.0  CREATININE 0.41*  LATICACIDVEN 2.2*    Estimated Creatinine Clearance: 56.9 mL/min (A) (by C-G formula based on SCr of 0.41 mg/dL (L)).    No Known Allergies  Melissa Cochran 06/26/2017 2:00 AM

## 2017-06-26 NOTE — Progress Notes (Signed)
Valproic acid level came back low at 37, suggestive of subtherapeutic dosing at her SNF.  LTM EEG:  This was an abnormal continuous video EEG due to generalized slowing with superimposed focal slowing and periodic discharges over the right temporal region. This was consistent with a diffuse cerebral disturbance with an additional focal epileptogenic disturbance over the right temporal region. The LPDs were blunted in morphology and showed no ictal evolution. Clinical correlation with imaging and CSF advised as this pattern can be seen in focal encephalitides.  Assessment: 82 y.o. female past medical history of seizures on Depakote and Keppra, CHF, paroxysmal atrial fibrillation, bilateral cerebral infarcts, possible meningoencephalitis, femoral thrombosis presented to [facility] ED on 2/16 with reported seizure activity at SNF for 45 minutes, which did not abort with initial benzos. History of seizures, on Keppra and VPA at SNF. Seizures stopped after giving the patient her home dose of Keppra as well as Depakote and adding an extra 800 mg of Dilantin.  1. Diagnosed with urosepsis, which may have precipitated the seizure.  2. LP not c/w infection.  3. EEG with generalized slowing, superimposed focal slowing and periodic discharges over the right temporal region. The latter finding is near the right convexity meningioma seen on her CT.   Recommendations: 1. Continue Keppra at 1000 mg BID 2. Continue increased dose of scheduled valproic acid, 1000 mg TID. Recheck VPA level on Tuesday.  3. Treat infection.   4. LTM EEG discontinued.   Electronically signed: Dr. Kerney Elbe

## 2017-06-27 ENCOUNTER — Ambulatory Visit (INDEPENDENT_AMBULATORY_CARE_PROVIDER_SITE_OTHER): Payer: Medicare Other | Admitting: Vascular Surgery

## 2017-06-27 ENCOUNTER — Inpatient Hospital Stay (HOSPITAL_COMMUNITY): Payer: Medicare Other

## 2017-06-27 DIAGNOSIS — R569 Unspecified convulsions: Secondary | ICD-10-CM

## 2017-06-27 LAB — GLUCOSE, CAPILLARY
Glucose-Capillary: 126 mg/dL — ABNORMAL HIGH (ref 65–99)
Glucose-Capillary: 105 mg/dL — ABNORMAL HIGH (ref 65–99)

## 2017-06-27 LAB — PROCALCITONIN: Procalcitonin: <0.1 ng/mL

## 2017-06-27 MED ORDER — PRO-STAT SUGAR FREE PO LIQD
30.0000 mL | Freq: Two times a day (BID) | ORAL | Status: DC
  Administered 2017-06-27 – 2017-06-30 (×7): 30 mL
  Filled 2017-06-27 (×8): qty 30

## 2017-06-27 MED ORDER — VITAL AF 1.2 CAL PO LIQD
1000.0000 mL | ORAL | Status: DC
  Administered 2017-06-27 – 2017-06-29 (×3): 1000 mL

## 2017-06-27 MED ORDER — DEXMEDETOMIDINE HCL IN NACL 200 MCG/50ML IV SOLN
0.4000 ug/kg/h | INTRAVENOUS | Status: DC
  Administered 2017-06-27 (×3): 0.4 ug/kg/h via INTRAVENOUS
  Administered 2017-06-27 – 2017-06-28 (×8): 0.7 ug/kg/h via INTRAVENOUS
  Administered 2017-06-29 (×2): 0.5 ug/kg/h via INTRAVENOUS
  Administered 2017-06-29: 0.7 ug/kg/h via INTRAVENOUS
  Administered 2017-06-29 – 2017-06-30 (×3): 0.6 ug/kg/h via INTRAVENOUS
  Administered 2017-06-30: 0.5 ug/kg/h via INTRAVENOUS
  Filled 2017-06-27 (×18): qty 50

## 2017-06-27 NOTE — Procedures (Signed)
ELECTROENCEPHALOGRAM REPORT  Date of Study: 06/27/2017  Patient's Name: Melissa Cochran MRN: [id number] Date of Birth: January 28, 1936  Referring Provider: Dr. Amie [place]  Clinical History: This is an 82 year old woman one episode of right-sided twitching overnight.  Medications: Depakote Keppra  Technical Summary: A multichannel digital EEG recording measured by the international 10-20 system with electrodes applied with paste and impedances below 5000 ohms performed as portable with EKG monitoring in an intubated and unresponsive  patient.  Hyperventilation and photic stimulation were not performed.  The digital EEG was referentially recorded, reformatted, and digitally filtered in a variety of bipolar and referential montages for optimal display.   Description: The patient is intubated and unresponsive during the recording. No sedating medication listed during EEG. There is no clear posterior dominant rhythm seen. The background consists of a moderate amount of diffuse 4-5 Hz theta and 2-3 Hz delta slowing admixed with alpha and beta frequencies. There are lateralized periodic discharges occurring at a frequency of 1/sec over the right hemisphere, maximal over the right frontotemporal region. No evolution in frequency or amplitude seen. Normal sleep architecture is not seen. There is slight increase in faster frequencies with noxious stimulation. Hyperventilation and photic stimulation were not performed. There were no electrographic seizures seen. No right-sided twitching noted.    EKG lead showed irregular rhythm.  Impression: This EEG is abnormal due to the presence of: 1. Moderate diffuse background slowing 2. Lateralized periodic discharges over the right hemisphere  Clinical Correlation of the above findings indicates diffuse cerebral dysfunction that is non-specific in etiology and can be seen with hypoxic/ischemic injury, toxic/metabolic encephalopathies, neurodegenerative  disorders, or medication effect. Although lateralized periodic discharges do not represent ongoing seizure activity, they are commonly associated with an acute brain lesion and clinical focal seizures, or post-ictal after focal status epilepticus. There were no electrographic seizures in this study. Right-sided twitching was not captured. Clinical correlation is advised.   Ellouise Newer, M.D.

## 2017-06-27 NOTE — Progress Notes (Signed)
EEG completed; results pending.    

## 2017-06-27 NOTE — Progress Notes (Signed)
During the bath it was noted that the pt started to have a tremor/ seizure-like activity in the R arm and foot, but subsided after 3-4 minutes.  I immediately contacted Dr. Lorraine Lax and informed him of the situation.  I was given instructions to continue monitoring the pt and inform him if I see anymore seizure-like activity as we will most likely put the pt back on an EEG at that point.  Will continue to monitor the situation and update MD if necessary.

## 2017-06-27 NOTE — Progress Notes (Addendum)
Neurology Progress Note   S:// One episode of right sided twitching overnight. No other events.  O:// Current vital signs: BP (!) 149/85   Pulse 93   Temp 98.8 F (37.1 C)   Resp 17   Ht '5\' 4"'$  (1.626 m)   Wt 77.8 kg (171 lb 8.3 oz)   SpO2 100%   BMI 29.44 kg/m  Vital signs in last 24 hours: Temp:  [98.4 F (36.9 C)-99.3 F (37.4 C)] 98.8 F (37.1 C) (02/18 0900) Pulse Rate:  [46-144] 93 (02/18 0900) Resp:  [15-17] 17 (02/18 0842) BP: (96-164)/(49-99) 149/85 (02/18 0900) SpO2:  [100 %] 100 % (02/18 0900) FiO2 (%):  [30 %] 30 % (02/18 0842) Weight:  [77.8 kg (171 lb 8.3 oz)] 77.8 kg (171 lb 8.3 oz) (02/18 0200) General: Sedated intubated in no acute distress HEENT: Endotracheal tube in place, normal cephalic, atraumatic, clear nares CVS: Irregularly irregular, S1-S2 heard Respiratory: Equal air entry, clear to auscultation Abdomen: Nondistended nontender Neurological exam Patient sedated on Versed 2.5/h. Intubated Does not open eyes to loud voice. Shuts her eyes real tight on sternal rub and tries to localize weakly with the right arm.  No movement on the left arm. No spontaneous movements noted initially but later when sedation was turned on, spontaneously moving the right arm. Withdraws right leg strongly.  Minimal withdrawal in the left leg.  Medications  Current Facility-Administered Medications:  .  0.9 %  sodium chloride infusion, 250 mL, Intravenous, PRN, Dewaine Oats, Katalina M, NP .  0.9 %  sodium chloride infusion, , Intravenous, Continuous, Eubanks, Katalina M, NP, Last Rate: 75 mL/hr at 06/27/17 0900 .  acetaminophen (TYLENOL) solution 650 mg, 650 mg, Oral, Q6H PRN, Omar Person, NP .  chlorhexidine gluconate (MEDLINE KIT) (PERIDEX) 0.12 % solution 15 mL, 15 mL, Mouth Rinse, BID, Amie Portland, MD, 15 mL at 06/27/17 0745 .  fentaNYL (SUBLIMAZE) injection 100 mcg, 100 mcg, Intravenous, Q15 min PRN, Omar Person, NP .  fentaNYL (SUBLIMAZE) injection  100 mcg, 100 mcg, Intravenous, Q2H PRN, Omar Person, NP, 100 mcg at 06/27/17 0531 .  heparin injection 5,000 Units, 5,000 Units, Subcutaneous, Q8H, Omar Person, NP, 5,000 Units at 06/27/17 0530 .  levETIRAcetam (KEPPRA) IVPB 1000 mg/100 mL premix, 1,000 mg, Intravenous, Q12H, Aroor, Lanice Schwab, MD, Stopped at 06/27/17 8101 .  MEDLINE mouth rinse, 15 mL, Mouth Rinse, 10 times per day, Amie Portland, MD, 15 mL at 06/27/17 0533 .  meropenem (MERREM) 1 g in sodium chloride 0.9 % 100 mL IVPB, 1 g, Intravenous, Q8H, Erenest Blank, RPH, Stopped at 06/27/17 0601 .  midazolam (VERSED) 50 mg in sodium chloride 0.9 % 50 mL (1 mg/mL) infusion, 0.5-8 mg/hr, Intravenous, Titrated, Anders Simmonds, MD, Last Rate: 2.5 mL/hr at 06/27/17 0900, 2.5 mg/hr at 06/27/17 0900 .  pantoprazole (PROTONIX) injection 40 mg, 40 mg, Intravenous, Daily, Hayden Pedro M, NP, 40 mg at 06/26/17 0940 .  phenylephrine (NEO-SYNEPHRINE) 10 mg in sodium chloride 0.9 % 250 mL (0.04 mg/mL) infusion, 0-200 mcg/min, Intravenous, Titrated, Omar Person, NP, Stopped at 06/26/17 [phone]  valproate (DEPACON) 1,000 mg in dextrose 5 % 50 mL IVPB, 1,000 mg, Intravenous, Q8H, Omar Person, NP, Stopped at 06/27/17 0631 Labs CBC    Component Value Date/Time   WBC 7.0 06/26/2017 0319   RBC 3.46 (L) 06/26/2017 0319   HGB 9.1 (L) 06/26/2017 0319   HGB 13.1 01/23/2014 1125   HCT 30.8 (L) 06/26/2017 0319  HCT 41.3 01/23/2014 1125   PLT 259 06/26/2017 0319   PLT 244 01/23/2014 1125   MCV 89.0 06/26/2017 0319   MCV 91 01/23/2014 1125   MCH 26.3 06/26/2017 0319   MCHC 29.5 (L) 06/26/2017 0319   RDW 13.2 06/26/2017 0319   RDW 13.3 01/23/2014 1125   LYMPHSABS 0.8 02/23/2017 0210   LYMPHSABS 1.1 01/23/2014 1125   MONOABS 0.7 02/23/2017 0210   MONOABS 0.5 01/23/2014 1125   EOSABS 0.3 02/23/2017 0210   EOSABS 0.1 01/23/2014 1125   BASOSABS 0.0 02/23/2017 0210   BASOSABS 0.0 01/23/2014 1125    CMP      Component Value Date/Time   NA 142 06/26/2017 0319   NA 141 08/26/2015 1131   NA 131 (L) 01/23/2014 1125   K 4.0 06/26/2017 0319   K 3.9 01/23/2014 1125   CL 109 06/26/2017 0319   CL 94 (L) 01/23/2014 1125   CO2 24 06/26/2017 0319   CO2 32 01/23/2014 1125   GLUCOSE 118 (H) 06/26/2017 0319   GLUCOSE 91 01/23/2014 1125   BUN 19 06/26/2017 0319   BUN 16 08/26/2015 1131   BUN 11 01/23/2014 1125   CREATININE 0.48 06/26/2017 0319   CREATININE 0.72 01/23/2014 1125   CALCIUM 8.4 (L) 06/26/2017 0319   CALCIUM 9.4 01/23/2014 1125   PROT 6.5 06/25/2017 2037   PROT 7.0 01/23/2014 1125   ALBUMIN 3.2 (L) 06/25/2017 2037   ALBUMIN 3.4 01/23/2014 1125   AST 31 06/25/2017 2037   AST 11 (L) 01/23/2014 1125   ALT 13 (L) 06/25/2017 2037   ALT 16 01/23/2014 1125   ALKPHOS 72 06/25/2017 2037   ALKPHOS 123 (H) 01/23/2014 1125   BILITOT 0.7 06/25/2017 2037   BILITOT 0.4 01/23/2014 1125   GFRNONAA >60 06/26/2017 0319   GFRNONAA >60 01/23/2014 1125   GFRAA >60 06/26/2017 0319   GFRAA >60 01/23/2014 1125    glycosylated hemoglobin  Lipid Panel  No results found for: CHOL, TRIG, HDL, CHOLHDL, VLDL, LDLCALC, LDLDIRECT   Imaging I have reviewed images in epic and the results pertinent to this consultation are: CT-scan of the brain -no acute changes.  Right-sided meningioma.  Assessment:  82 year old woman past history of seizures on Depakote and Keppra, CHF, paroxysmal A. 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 Seizures were controlled after the patient received her Keppra, Depakote and a load of Dilantin. LTM EEG showed generalized slowing and superimposed focal slowing and periodic discharges over the right temporal region consistent with diffuse cerebral  disturbance with an additional focal epileptogenic disturbance in the right temporal region. Overnight, there was report of some right-sided seizure activity that lasted 3-4 minutes. No seizure activity since then.  Patient was on 2.5 mg of Versed an hour after that. Spinal tap was done which is not consistent with infection. She has urosepsis. Most likely lowered seizure threshold in the setting of subtherapeutic antiepileptic medications and urosepsis.  Impression: Breakthrough seizure in the setting of subtherapeutic anti-epileptic medications and urosepsis Urosepsis Concern for right-sided seizure overnight  Recommendations: Wean sedation - d/c versed drip Continue with Depakote 1000 TID and check levels tomorrow. Continue with Keppra 1000 BID Routine EEG given the overnight brief right sided twitching to look for focality on left hemisphere. Prior EEG showed focal slowing and periodic discharges over right  hemisphere possibly related to right convexity meningioma. MRI Brain w/o contrast to look for any focal lesions on left and also f/u lesions on right seen previously in October. Management of the infection and toxic metabolic derangements per primary team as you are. We will follow with you. Plan communicated with the critical care attending in person on the unit.  -- Amie Portland, MD [facility] Pager: [phone] If 7pm to 7am, please call on call as listed on AMION.   CRITICAL CARE ATTESTATION This patient is critically ill and at significant risk of neurological worsening, death and care requires constant monitoring of vital signs, hemodynamics,respiratory and cardiac monitoring. I spent 30  minutes of neurocritical care time performing neurological assessment, discussion with family, other specialists and medical decision making of high complexityin the care of  this patient.

## 2017-06-27 NOTE — Progress Notes (Signed)
Initial Nutrition Assessment  INTERVENTION:   Vital AF 1.2 @ 45 ml/hr (1080 ml/day) 30 ml Prostat BID  Provides: 1496 kcal, 111 grams protein, and 875 ml free water.    NUTRITION DIAGNOSIS:   Inadequate oral intake related to inability to eat as evidenced by NPO status.  GOAL:   Patient will meet greater than or equal to 90% of their needs  MONITOR:   Vent status, TF tolerance  REASON FOR ASSESSMENT:   Consult, Ventilator Enteral/tube feeding initiation and management  ASSESSMENT:   Pt with PMH of HTN, CHF, COPD, HLD, CAD, Sz, meningioma, recent hospitalization 10/7-10/16 for sz. Pt ultimately had trach went to [facility] and was decannulated, went to rehab was able feed herself but was weak and wheelchair bound. Pt admitted 2/16 with seizure activity for 45 min. Pt intubated.     Pt discussed during ICU rounds and with RN.  MRI pending No family present during visit.   Patient is currently intubated on ventilator support MV: 6.9 L/min Temp (24hrs), Avg:98.9 F (37.2 C), Min:98.4 F (36.9 C), Max:99.7 F (37.6 C)  Medications and labs reviewed   70 F OG tube with tip just beyond GE junction per xray 2/16 Admission weight: 169 lb (76.8 kg)   NUTRITION - FOCUSED PHYSICAL EXAM:    Most Recent Value  Orbital Region  No depletion  Upper Arm Region  No depletion  Thoracic and Lumbar Region  No depletion  Buccal Region  Unable to assess  Temple Region  Mild depletion  Clavicle Bone Region  No depletion  Clavicle and Acromion Bone Region  No depletion  Scapular Bone Region  No depletion  Dorsal Hand  No depletion  Patellar Region  No depletion  Anterior Thigh Region  No depletion  Posterior Calf Region  No depletion  Edema (RD Assessment)  Mild  Hair  Reviewed  Eyes  Unable to assess  Mouth  Unable to assess  Skin  Reviewed  Nails  Reviewed       Diet Order:  No diet orders on file  EDUCATION NEEDS:   No education needs have been identified at this  time  Skin:  Skin Assessment: (left leg non-pressure wound)  Last BM:  unknown  Height:   Ht Readings from Last 1 Encounters:  06/27/17 5\' 4"  (1.626 m)    Weight:   Wt Readings from Last 1 Encounters:  06/27/17 171 lb 8.3 oz (77.8 kg)    Ideal Body Weight:  54.5 kg  BMI:  Body mass index is 29.44 kg/m.  Estimated Nutritional Needs:   Kcal:  1470  Protein:  105-120 grams  Fluid:  > 1.5 L/day  Maylon Peppers RD, LDN, CNSC [phone] Pager [phone] After Hours Pager

## 2017-06-27 NOTE — Progress Notes (Signed)
PULMONARY / CRITICAL CARE MEDICINE   Name: Melissa Cochran MRN: [id number] DOB: 1936-02-22    ADMISSION DATE:  06/26/2017   CHIEF COMPLAINT:  Unresponsive, post ictal  HISTORY OF PRESENT ILLNESS:      82 year old female with PMH of HTN, CHF, COPD, HLD, CAD, Seizures, Meningioma   Recent Hospitalization 10/7-10/16. Found unresponsive, intubated, concerning for seizures. 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 Blood cultures positive for staph, however believed to be contaminant. UTI due to ESBL E.Coli. EEG with sharp activity over the right hemisphere which suggests focal disturbance. Ultimately Trach and PEG was placed as mental status did not improve. Discharged to [facility]. Decannulation shortly after due to improvement. Went to [facility], since has been weak and wheelchair bound, able to feed self, alert and oriented.   Presented to [facility] ED on 2/16 with reported seizure activity for 45 minutes. Given 4 mg versed per EMS. Upon arrival to ED given 2 mg of Ativan without improvement. Intubated for airway protection and transferred to [facility].  She remains intubated and mechanically ventilated this morning. EEG showed no ongoing seizure activity and versed infusion was dicontinued. She remains unresponsive during my exam     PAST MEDICAL HISTORY :  She  has a past medical history of Adnexal mass, Anxiety, Arthritis, Bladder tumor, Bleeding ulcer, CHF (congestive heart failure) ([place]), CHF (congestive heart failure) ([place]), COPD (chronic obstructive pulmonary disease) ([place]), Coronary artery disease, GERD (gastroesophageal reflux disease), Heart block, History of bleeding ulcers, Hyperlipemia, Hypertension, Pneumonia, and Shortness of breath dyspnea.  PAST SURGICAL HISTORY: She  has a past surgical history that includes Cardiac surgery; Abdominal hysterectomy; Stomach surgery; Hip surgery; Cardiac catheterization (Left, 10/21/2015); Cardiac catheterization (N/A, 10/21/2015);  Transurethral resection of bladder tumor (N/A, 01/20/2016); Cystoscopy w/ retrogrades (Bilateral, 01/20/2016); Cystoscopy with stent placement (Right, 01/20/2016); and Tracheostomy tube placement (N/A, 03/02/2017).  No Known Allergies  No current facility-administered medications on file prior to encounter.    Current Outpatient Medications on File Prior to Encounter  Medication Sig  . acetaminophen (TYLENOL) 325 MG tablet Take 2 tablets (650 mg total) by mouth every 6 (six) hours as needed for mild pain (or Fever >/= 101).  . Amino Acids-Protein Hydrolys (FEEDING SUPPLEMENT, PRO-STAT SUGAR FREE 64,) LIQD Place 30 mLs into feeding tube 2 (two) times daily. (Patient taking differently: Take 30 mLs by mouth 3 (three) times daily. )  . amLODipine (NORVASC) 10 MG tablet Take 10 mg by mouth daily.  Marland Kitchen aspirin EC 81 MG tablet Take 81 mg by mouth daily.  Marland Kitchen atorvastatin (LIPITOR) 10 MG tablet Take 10 mg by mouth at bedtime.  . carvedilol (COREG) 6.25 MG tablet Take 6.25 mg by mouth 2 (two) times daily.  Marland Kitchen levETIRAcetam (KEPPRA) 750 MG tablet Take 750 mg by mouth 2 (two) times daily.  . Melatonin 3 MG TABS Take 3 mg by mouth at bedtime.  . Multiple Vitamin (THEREMS) TABS Take 1 tablet by mouth daily.  . sertraline (ZOLOFT) 25 MG tablet Take 25 mg by mouth daily.  . tamsulosin (FLOMAX) 0.4 MG CAPS capsule Take 0.4 mg by mouth daily.  . Valproate Sodium (DEPAKENE) 250 MG/5ML SOLN solution Take 500 mg by mouth 2 (two) times daily.  Marland Kitchen acetaminophen (TYLENOL) 650 MG suppository Place 1 suppository (650 mg total) rectally every 6 (six) hours as needed for mild pain (or Fever >/= 101). (Patient not taking: Reported on 06/26/2017)  . acyclovir 650 mg in dextrose 5 % 100 mL Inject 650 mg into the  vein daily. (Patient not taking: Reported on 06/26/2017)  . albuterol (PROVENTIL) (2.5 MG/3ML) 0.083% nebulizer solution Take 3 mLs (2.5 mg total) by nebulization every 2 (two) hours as needed for wheezing. (Patient not  taking: Reported on 06/26/2017)  . chlorhexidine gluconate, MEDLINE KIT, (PERIDEX) 0.12 % solution 15 mLs by Mouth Rinse route 2 (two) times daily. (Patient not taking: Reported on 06/26/2017)  . hydrALAZINE (APRESOLINE) 20 MG/ML injection Inject 0.5 mLs (10 mg total) into the vein every 6 (six) hours as needed (SBP>180 or DBP>100). (Patient not taking: Reported on 06/26/2017)  . levETIRAcetam 500 mg in sodium chloride 0.9 % 100 mL Inject 500 mg into the vein every 12 (twelve) hours. (Patient not taking: Reported on 06/26/2017)  . meropenem 1 g in sodium chloride 0.9 % 100 mL Inject 1 g into the vein every 12 (twelve) hours. (Patient not taking: Reported on 06/26/2017)  . metoprolol tartrate (LOPRESSOR) 5 MG/5ML SOLN injection Inject 2.5-5 mLs (2.5-5 mg total) into the vein every 6 (six) hours as needed (for heart rate >120). (Patient not taking: Reported on 06/26/2017)  . mouth rinse LIQD solution 15 mLs by Mouth Rinse route as needed. (Patient not taking: Reported on 06/26/2017)  . Multiple Vitamin (MULTIVITAMIN) LIQD Place 15 mLs into feeding tube daily. (Patient not taking: Reported on 06/26/2017)  . Nutritional Supplements (FEEDING SUPPLEMENT, VITAL 1.5 CAL,) LIQD Place 1,000 mLs into feeding tube daily. (Patient not taking: Reported on 06/26/2017)  . ondansetron (ZOFRAN) 4 MG/2ML SOLN injection Inject 2 mLs (4 mg total) into the vein every 6 (six) hours as needed for nausea. (Patient not taking: Reported on 06/26/2017)  . pantoprazole sodium (PROTONIX) 40 mg/20 mL PACK Place 20 mLs (40 mg total) into feeding tube daily at 12 noon. (Patient not taking: Reported on 06/26/2017)  . senna-docusate (SENOKOT-S) 8.6-50 MG tablet Place 1 tablet into feeding tube 2 (two) times daily. (Patient not taking: Reported on 06/26/2017)  . valproate 1,000 mg in dextrose 5 % 50 mL Inject 1,000 mg into the vein every 8 (eight) hours. (Patient not taking: Reported on 06/26/2017)  . Water For Irrigation, Sterile (FREE WATER) SOLN  Place 100 mLs into feeding tube every 8 (eight) hours. (Patient not taking: Reported on 06/26/2017)    FAMILY HISTORY:  Her indicated that her mother is deceased. She indicated that her father is deceased. She indicated that the status of her neg hx is unknown.   SOCIAL HISTORY: She  reports that she quit smoking about 19 years ago. She has a 40.00 pack-year smoking history. she has never used smokeless tobacco. She reports that she does not drink alcohol or use drugs.  REVIEW OF SYSTEMS:   Not obtainable  SUBJECTIVE:  As above  VITAL SIGNS: BP (!) 160/119   Pulse (!) 120   Temp 99 F (37.2 C)   Resp 17   Ht '5\' 4"'$  (1.626 m)   Wt 171 lb 8.3 oz (77.8 kg)   SpO2 97%   BMI 29.44 kg/m   HEMODYNAMICS:    VENTILATOR SETTINGS: Vent Mode: PRVC FiO2 (%):  [30 %] 30 % Set Rate:  [15 bmp] 15 bmp Vt Set:  [450 mL] 450 mL PEEP:  [5 cmH20] 5 cmH20 Plateau Pressure:  [15 cmH20-21 cmH20] 18 cmH20  INTAKE / OUTPUT: I/O last 3 completed shifts: In: 3834 [I.V.:2894; IV Piggyback:940] Out: 5053 [Urine:1650; Emesis/NG output:175]  PHYSICAL EXAMINATION: General: Chronically ill-appearing elderly female who is intubated and mechanically ventilated she is in no overt distress Neuro:  Is no response to voice or loud noise.  She extends the left upper extremity in response to sternal rub.  Pupils are equal and reactive.   Cardiovascular: S1 and S2 are somewhat rapid with frequent ectopics.  There is no murmur rub or gallop. Lungs: She is not breathing above the set ventilator rate, respirations are unlabored, there is symmetric air movement, few scattered rhonchi, no wheezes. Abdomen: The abdomen is soft without any organomegaly masses tenderness  LABS:  BMET Recent Labs  Lab 06/25/17 2037 06/26/17 0319  NA 140 142  K 4.7 4.0  CL 103 109  CO2 29 24  BUN 24* 19  CREATININE 0.41* 0.48  GLUCOSE 113* 118*    Electrolytes Recent Labs  Lab 06/25/17 2037 06/26/17 0319  CALCIUM 9.3  8.4*  MG  --  1.7  PHOS  --  3.6    CBC Recent Labs  Lab 06/25/17 2037 06/26/17 0319  WBC 6.0 7.0  HGB 10.6* 9.1*  HCT 33.8* 30.8*  PLT 270 259    Coag's Recent Labs  Lab 06/25/17 2104  INR 1.18    Sepsis Markers Recent Labs  Lab 06/25/17 2037 06/26/17 0319 06/27/17 0232  LATICACIDVEN 2.2* 2.5*  --   PROCALCITON <0.10  --  <0.10    ABG Recent Labs  Lab 06/25/17 2153 06/26/17 0210 06/26/17 0416  PHART 7.37 7.303* 7.390  PCO2ART 49* 50.4* 41.9  PO2ART 81* 71.0* 88.2    Liver Enzymes Recent Labs  Lab 06/25/17 2037  AST 31  ALT 13*  ALKPHOS 72  BILITOT 0.7  ALBUMIN 3.2*    Cardiac Enzymes Recent Labs  Lab 06/25/17 2037  TROPONINI <0.03    Glucose No results for input(s): GLUCAP in the last 168 hours.  Imaging No results found.    CULTURES: Urine obtained at [place] is growing greater than 10 to the fifth gram-negative rods  ANTIBIOTICS: Meropenem started on 2/17   DISCUSSION:      This is an 82 year old with a prior history of ESBL producing gram-negative UTI and a known right convexity meningioma who was transferred to this hospital after prolonged seizure.   ASSESSMENT / PLAN:  PULMONARY A: Chest x-ray does not show evolution of a infiltrate to my eye.  Her gas exchange and mechanics actually look quite good and it is only her mental status of precludes [place] from extubating the patient.  CARDIOVASCULAR A: Now appears to be in atrial fibrillation.  I will be adding a beta-blocker if necessary for rate control.  GASTROINTESTINAL A: Prophylaxis is with Protonix  INFECTIOUS A: Continue meropenem pending identification of the gram-negative in her urine.  She has a history of ESBL producing E. coli  NEUROLOGIC A: I am very concerned by her persistent poor neurological status.  An MRI is pending looking for Ms. structural insult I am particularly concerned in the setting of what appears to be new onset atrial fibrillation.  Her dose  of valproic acid and Keppra have been adjusted and her Versed infusion has been discontinued  The patient remains acutely ill with altered mental status requiring life support measures including intubation and mechanical ventilation.  Greater than 32 minutes was spent in the care of this patient today   Lars Masson, MD [facility] [place] Pager: [phone]  06/27/2017, 10:28 AM

## 2017-06-28 ENCOUNTER — Inpatient Hospital Stay (HOSPITAL_COMMUNITY): Payer: Medicare Other

## 2017-06-28 DIAGNOSIS — R4182 Altered mental status, unspecified: Secondary | ICD-10-CM

## 2017-06-28 DIAGNOSIS — I361 Nonrheumatic tricuspid (valve) insufficiency: Secondary | ICD-10-CM

## 2017-06-28 LAB — MAGNESIUM: MAGNESIUM: 2 mg/dL (ref 1.7–2.4)

## 2017-06-28 LAB — GLUCOSE, CAPILLARY
GLUCOSE-CAPILLARY: 121 mg/dL — AB (ref 65–99)
GLUCOSE-CAPILLARY: 121 mg/dL — AB (ref 65–99)
GLUCOSE-CAPILLARY: 101 mg/dL — AB (ref 65–99)
GLUCOSE-CAPILLARY: 114 mg/dL — AB (ref 65–99)
GLUCOSE-CAPILLARY: 133 mg/dL — AB (ref 65–99)
GLUCOSE-CAPILLARY: 106 mg/dL — AB (ref 65–99)

## 2017-06-28 LAB — URINE CULTURE: Culture: >=100000 — AB

## 2017-06-28 LAB — BASIC METABOLIC PANEL
ANION GAP: 10 (ref 5–15)
BUN: 21 mg/dL — AB (ref 6–20)
CHLORIDE: 109 mmol/L (ref 101–111)
CO2: 24 mmol/L (ref 22–32)
Calcium: 8.9 mg/dL (ref 8.9–10.3)
Creatinine, Ser: 0.53 mg/dL (ref 0.44–1.00)
GFR calc Af Amer: >60 mL/min (ref >60)
GLUCOSE: 127 mg/dL — AB (ref 65–99)
POTASSIUM: 4.5 mmol/L (ref 3.5–5.1)
SODIUM: 143 mmol/L (ref 135–145)

## 2017-06-28 LAB — CULTURE, RESPIRATORY W GRAM STAIN

## 2017-06-28 LAB — CULTURE, RESPIRATORY

## 2017-06-28 LAB — ECHOCARDIOGRAM COMPLETE
HEIGHTINCHES: 64 in
Weight: 2920.65 oz

## 2017-06-28 LAB — PHOSPHORUS: Phosphorus: 3 mg/dL (ref 2.5–4.6)

## 2017-06-28 LAB — TSH: TSH: 7.725 u[IU]/mL — ABNORMAL HIGH (ref 0.350–4.500)

## 2017-06-28 MED ORDER — LORAZEPAM 2 MG/ML IJ SOLN
2.0000 mg | INTRAMUSCULAR | Status: DC | PRN
  Administered 2017-06-28 – 2017-06-29 (×2): 2 mg via INTRAVENOUS
  Filled 2017-06-28 (×4): qty 1

## 2017-06-28 NOTE — Progress Notes (Signed)
  Echocardiogram 2D Echocardiogram has been performed.  Melissa Cochran 06/28/2017, 10:10 AM

## 2017-06-28 NOTE — Progress Notes (Signed)
Subjective: Significant changes at this time.  No episodes of right side twitching overnight Exam: Vitals:   06/28/17 1100 06/28/17 1121  BP: 129/79   Pulse: 77   Resp:    Temp: 100.2 F (37.9 C)   SpO2: 100% 98%    Physical Exam   HEENT-  Normocephalic, no lesions, without obvious abnormality.  Normal external eye and conjunctiva.   Cardiovascular- S1-S2 audible, rregular irregular Lungs-no rhonchi or wheezing noted, no excessive working breathing.  Saturations within normal limits Abdomen- All 4 quadrants palpated and nontender Extremities- Warm, dry and intact Musculoskeletal-no joint tenderness, deformity or swelling Skin-warm and dry, no hyperpigmentation, vitiligo, or suspicious lesions Neuro:  Mental Status: Currently intubated and breathing over the vent.  Patient does localize to discomfort to the right arm in addition grimaces to noxious stimuli.  Patient continues to be sedated.  Does not open eyes to voice. Cranial Nerves: II: Blinks to threat bilaterally III,IV, VI: ptosis not present, extra-ocular motions intact bilaterally pupils equal, round, reactive to light and accommodation V,VII: Difficult to assess as she is intubated and is dated VIII: As noted above Motor: Localizes antigravity with right arm.  Does not move left side.  Patient does grimace to noxious stimuli throughout Sensory: Localizes of the right arm to noxious stimuli and grimaces throughout.     Medications:  Scheduled: . chlorhexidine gluconate (MEDLINE KIT)  15 mL Mouth Rinse BID  . feeding supplement (PRO-STAT SUGAR FREE 64)  30 mL Per Tube BID  . heparin  5,000 Units Subcutaneous Q8H  . mouth rinse  15 mL Mouth Rinse 10 times per day  . pantoprazole (PROTONIX) IV  40 mg Intravenous Daily   Continuous: . sodium chloride 250 mL (06/28/17 1100)  . sodium chloride 75 mL/hr at 06/28/17 1100  . dexmedetomidine (PRECEDEX) IV infusion 0.7 mcg/kg/hr (06/28/17 1119)  . feeding supplement (VITAL  AF 1.2 CAL) 1,000 mL (06/28/17 1100)  . levETIRAcetam Stopped (06/28/17 0421)  . meropenem (MERREM) IV Stopped (06/28/17 0754)  . midazolam (VERSED) infusion Stopped (06/27/17 0955)  . phenylephrine (NEO-SYNEPHRINE) Adult infusion Stopped (06/26/17 0949)  . valproate sodium Stopped (06/28/17 0754)    Pertinent Labs/Diagnostics: EEG: Impression: This EEG is abnormal due to the presence of: 1. Moderate diffuse background slowing 2. Lateralized periodic discharges over the right hemisphere  Clinical Correlation of the above findings indicates diffuse cerebral dysfunction that is non-specific in etiology and can be seen with hypoxic/ischemic injury, toxic/metabolic encephalopathies, neurodegenerative disorders, or medication effect. Although lateralized periodic discharges do not represent ongoing seizure activity, they are commonly associated with an acute brain lesion and clinical focal seizures, or post-ictal after focal status epilepticus. There were no electrographic seizures in this study. Right-sided twitching was not captured. Clinical correlation is advised.  We will be obtaining MRI today  No results found.  Etta Quill PA-C [facility] [phone]  Impression:  Breakthrough seizure in the setting of subtherapeutic anti-epileptic medications and urosepsis Urosepsis  Recommendations: Continue with Depakote 1000 3 times daily.  Continue with Keppra 1000 mg twice daily. MRI brain without contrast will be obtained today Continue to manage infection and toxic metabolic derangements.  06/28/2017, 11:29 AM  Attending Neurohospitalist Addendum Patient seen and examined with APP/Resident. Agree with the history and physical as documented above. Agree with the plan as documented, which I helped formulate. I have independently reviewed the chart, obtained history, review of systems and examined the patient.I have personally reviewed pertinent head/neck/spine imaging  (CT/MRI). Please feel free to call  with any questions. --- Amie Portland, MD [facility] Pager: [phone]  If 7pm to 7am, please call on call as listed on AMION.  CRITICAL CARE ATTESTATION This patient is critically ill and at significant risk of neurological worsening, death and care requires constant monitoring of vital signs, hemodynamics,respiratory and cardiac monitoring. I spent 30  minutes of neurocritical care time performing neurological assessment, discussion with family, other specialists and medical decision making of high complexityin the care of  this patient.

## 2017-06-28 NOTE — Procedures (Signed)
Central venous catheter placement Indication: Venous access in a patient with intermittent seizures and no reliable peripheral access. A line was placed as medically indicated as we have no surrogate for this patient.  A timeout was performed in the area over the left subclavian extensively prepped with chlorhexidine.  The area was widely draped after donning sterile garb and the subclavian vein easily cannulated.  A wire was gently placed and the tract dilated and a 20 cm 7 French triple-lumen catheter was placed over the wire without difficulty there was good flow from all ports.  The catheter was sutured in place at 20 cm and a Biopatch applied followed by a sterile dressing. Chest x-ray to confirm placement is pending

## 2017-06-28 NOTE — Progress Notes (Signed)
PULMONARY / CRITICAL CARE MEDICINE   Name: Melissa Cochran MRN: [id number] DOB: Jul 01, 1935    ADMISSION DATE:  06/26/2017   CHIEF COMPLAINT:  Unresponsive, post ictal  HISTORY OF PRESENT ILLNESS:      82 year old female with PMH of HTN, CHF, COPD, HLD, CAD, Seizures, Meningioma   Recent Hospitalization 10/7-10/16. Found unresponsive, intubated, concerning for seizures. 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 Blood cultures positive for staph, however believed to be contaminant. UTI due to ESBL E.Coli. EEG with sharp activity over the right hemisphere which suggests focal disturbance. Ultimately Trach and PEG was placed as mental status did not improve. Discharged to [facility]. Decannulation shortly after due to improvement. Went to [facility], since has been weak and wheelchair bound, able to feed self, alert and oriented.   Presented to [facility] ED on 2/16 with reported seizure activity for 45 minutes. Given 4 mg versed per EMS. Upon arrival to ED given 2 mg of Ativan without improvement. Intubated for airway protection and transferred to [facility].  She remains intubated and mechanically ventilated this morning. EEG shows periodic discharges but no overt seizure activity.She is sedated with Precedex this am, showing more purposeful movement, but not interactive. No clinical seizure activity.   PAST MEDICAL HISTORY :  She  has a past medical history of Adnexal mass, Anxiety, Arthritis, Bladder tumor, Bleeding ulcer, CHF (congestive heart failure) ([place]), CHF (congestive heart failure) ([place]), COPD (chronic obstructive pulmonary disease) ([place]), Coronary artery disease, GERD (gastroesophageal reflux disease), Heart block, History of bleeding ulcers, Hyperlipemia, Hypertension, Pneumonia, and Shortness of breath dyspnea.  PAST SURGICAL HISTORY: She  has a past surgical history that includes Cardiac surgery; Abdominal hysterectomy; Stomach surgery; Hip surgery; Cardiac catheterization (Left,  10/21/2015); Cardiac catheterization (N/A, 10/21/2015); Transurethral resection of bladder tumor (N/A, 01/20/2016); Cystoscopy w/ retrogrades (Bilateral, 01/20/2016); Cystoscopy with stent placement (Right, 01/20/2016); and Tracheostomy tube placement (N/A, 03/02/2017).  No Known Allergies  No current facility-administered medications on file prior to encounter.    Current Outpatient Medications on File Prior to Encounter  Medication Sig  . acetaminophen (TYLENOL) 325 MG tablet Take 2 tablets (650 mg total) by mouth every 6 (six) hours as needed for mild pain (or Fever >/= 101).  . Amino Acids-Protein Hydrolys (FEEDING SUPPLEMENT, PRO-STAT SUGAR FREE 64,) LIQD Place 30 mLs into feeding tube 2 (two) times daily. (Patient taking differently: Take 30 mLs by mouth 3 (three) times daily. )  . amLODipine (NORVASC) 10 MG tablet Take 10 mg by mouth daily.  Marland Kitchen aspirin EC 81 MG tablet Take 81 mg by mouth daily.  Marland Kitchen atorvastatin (LIPITOR) 10 MG tablet Take 10 mg by mouth at bedtime.  . carvedilol (COREG) 6.25 MG tablet Take 6.25 mg by mouth 2 (two) times daily.  Marland Kitchen levETIRAcetam (KEPPRA) 750 MG tablet Take 750 mg by mouth 2 (two) times daily.  . Melatonin 3 MG TABS Take 3 mg by mouth at bedtime.  . Multiple Vitamin (THEREMS) TABS Take 1 tablet by mouth daily.  . sertraline (ZOLOFT) 25 MG tablet Take 25 mg by mouth daily.  . tamsulosin (FLOMAX) 0.4 MG CAPS capsule Take 0.4 mg by mouth daily.  . Valproate Sodium (DEPAKENE) 250 MG/5ML SOLN solution Take 500 mg by mouth 2 (two) times daily.  Marland Kitchen acetaminophen (TYLENOL) 650 MG suppository Place 1 suppository (650 mg total) rectally every 6 (six) hours as needed for mild pain (or Fever >/= 101). (Patient not taking: Reported on 06/26/2017)  . acyclovir 650 mg in dextrose 5 %  100 mL Inject 650 mg into the vein daily. (Patient not taking: Reported on 06/26/2017)  . albuterol (PROVENTIL) (2.5 MG/3ML) 0.083% nebulizer solution Take 3 mLs (2.5 mg total) by nebulization every 2  (two) hours as needed for wheezing. (Patient not taking: Reported on 06/26/2017)  . chlorhexidine gluconate, MEDLINE KIT, (PERIDEX) 0.12 % solution 15 mLs by Mouth Rinse route 2 (two) times daily. (Patient not taking: Reported on 06/26/2017)  . hydrALAZINE (APRESOLINE) 20 MG/ML injection Inject 0.5 mLs (10 mg total) into the vein every 6 (six) hours as needed (SBP>180 or DBP>100). (Patient not taking: Reported on 06/26/2017)  . levETIRAcetam 500 mg in sodium chloride 0.9 % 100 mL Inject 500 mg into the vein every 12 (twelve) hours. (Patient not taking: Reported on 06/26/2017)  . meropenem 1 g in sodium chloride 0.9 % 100 mL Inject 1 g into the vein every 12 (twelve) hours. (Patient not taking: Reported on 06/26/2017)  . metoprolol tartrate (LOPRESSOR) 5 MG/5ML SOLN injection Inject 2.5-5 mLs (2.5-5 mg total) into the vein every 6 (six) hours as needed (for heart rate >120). (Patient not taking: Reported on 06/26/2017)  . mouth rinse LIQD solution 15 mLs by Mouth Rinse route as needed. (Patient not taking: Reported on 06/26/2017)  . Multiple Vitamin (MULTIVITAMIN) LIQD Place 15 mLs into feeding tube daily. (Patient not taking: Reported on 06/26/2017)  . Nutritional Supplements (FEEDING SUPPLEMENT, VITAL 1.5 CAL,) LIQD Place 1,000 mLs into feeding tube daily. (Patient not taking: Reported on 06/26/2017)  . ondansetron (ZOFRAN) 4 MG/2ML SOLN injection Inject 2 mLs (4 mg total) into the vein every 6 (six) hours as needed for nausea. (Patient not taking: Reported on 06/26/2017)  . pantoprazole sodium (PROTONIX) 40 mg/20 mL PACK Place 20 mLs (40 mg total) into feeding tube daily at 12 noon. (Patient not taking: Reported on 06/26/2017)  . senna-docusate (SENOKOT-S) 8.6-50 MG tablet Place 1 tablet into feeding tube 2 (two) times daily. (Patient not taking: Reported on 06/26/2017)  . valproate 1,000 mg in dextrose 5 % 50 mL Inject 1,000 mg into the vein every 8 (eight) hours. (Patient not taking: Reported on 06/26/2017)  .  Water For Irrigation, Sterile (FREE WATER) SOLN Place 100 mLs into feeding tube every 8 (eight) hours. (Patient not taking: Reported on 06/26/2017)    FAMILY HISTORY:  Her indicated that her mother is deceased. She indicated that her father is deceased. She indicated that the status of her neg hx is unknown.   SOCIAL HISTORY: She  reports that she quit smoking about 19 years ago. She has a 40.00 pack-year smoking history. she has never used smokeless tobacco. She reports that she does not drink alcohol or use drugs.  REVIEW OF SYSTEMS:   Not obtainable  SUBJECTIVE:  As above  VITAL SIGNS: BP 118/64   Pulse 74   Temp 99.3 F (37.4 C)   Resp 15   Ht '5\' 4"'$  (1.626 m)   Wt 182 lb 8.7 oz (82.8 kg)   SpO2 99%   BMI 31.33 kg/m   HEMODYNAMICS:    VENTILATOR SETTINGS: Vent Mode: PRVC FiO2 (%):  [30 %] 30 % Set Rate:  [15 bmp] 15 bmp Vt Set:  [450 mL] 450 mL PEEP:  [5 cmH20] 5 cmH20 Plateau Pressure:  [15 cmH20-19 cmH20] 18 cmH20  INTAKE / OUTPUT: I/O last 3 completed shifts: In: 4755.1 [I.V.:3038.9; NG/GT:716.3; IV Piggyback:1000] Out: 1995 [Urine:1820; Emesis/NG output:175]  PHYSICAL EXAMINATION: General: Chronically ill-appearing elderly female who is intubated and mechanically ventilated she  is in no overt distress. Tonic contracture of the neck to the right. Neuro: There is no response to voice or loud noise.  She spontaneoulsymoves both upper extremities. Does not eye open to sternal rub, pupils equal..   Cardiovascular: S1 and S2 are somewhat rapid with frequent ectopics.  There is no murmur rub or gallop. Lungs: She is breathing comfortably on PS 12 .Rrespirations are unlabored, there is symmetric air movement, few scattered rhonchi, no wheezes. Abdomen: The abdomen is obese, soft without any organomegaly masses tenderness  LABS:  BMET Recent Labs  Lab 06/25/17 2037 06/26/17 0319  NA 140 142  K 4.7 4.0  CL 103 109  CO2 29 24  BUN 24* 19  CREATININE 0.41*  0.48  GLUCOSE 113* 118*    Electrolytes Recent Labs  Lab 06/25/17 2037 06/26/17 0319  CALCIUM 9.3 8.4*  MG  --  1.7  PHOS  --  3.6    CBC Recent Labs  Lab 06/25/17 2037 06/26/17 0319  WBC 6.0 7.0  HGB 10.6* 9.1*  HCT 33.8* 30.8*  PLT 270 259    Coag's Recent Labs  Lab 06/25/17 2104  INR 1.18    Sepsis Markers Recent Labs  Lab 06/25/17 2037 06/26/17 0319 06/27/17 0232  LATICACIDVEN 2.2* 2.5*  --   PROCALCITON <0.10  --  <0.10    ABG Recent Labs  Lab 06/25/17 2153 06/26/17 0210 06/26/17 0416  PHART 7.37 7.303* 7.390  PCO2ART 49* 50.4* 41.9  PO2ART 81* 71.0* 88.2    Liver Enzymes Recent Labs  Lab 06/25/17 2037  AST 31  ALT 13*  ALKPHOS 72  BILITOT 0.7  ALBUMIN 3.2*    Cardiac Enzymes Recent Labs  Lab 06/25/17 2037  TROPONINI <0.03    Glucose Recent Labs  Lab 06/27/17 2039 06/27/17 2324 06/28/17 0341 06/28/17 0756  GLUCAP 126* 105* 121* 121*    Imaging No results found.    CULTURES: Urine obtained at [place] is growing greater than 10 to the fifth gram-negative rods  ANTIBIOTICS: Meropenem started on 2/17   DISCUSSION:      This is an 82 year old with a prior history of ESBL producing gram-negative UTI and a known right convexity meningioma who was transferred to this hospital after prolonged seizure. No clinical or EEG evidence of seizures overnight.  ASSESSMENT / PLAN:  PULMONARY A: Chest x-ray does not show evolution of a infiltrate to my eye.  Her gas exchange and mechanics are  quite good and it is only her mental status that precludes [place] from extubating the patient.  CARDIOVASCULAR A: Now appears to be in atrial fibrillation.  I will be adding a beta-blocker if necessary for rate control.  GASTROINTESTINAL A: Prophylaxis is with Protonix  INFECTIOUS A: Continue meropenem pending identification of the gram-negative in her urine.  She has a history of ESBL producing E. coli  NEUROLOGIC A: She has a  persistent poor neurological status more than 24 hours after control of her last known seizure.  I am concerned that there is no longer represents a postictal state would like to get a structural study of her brain.  Unfortunately the MRI has not been performed yet so I have ordered a stat noncontrast CT of the head.  We are optimizing metabolic parameters, ensuring electrolytes are normal ensuring the potential infections are treated.  Pertinent the latter her urine culture is not yet back and she will continue on meropenem.    Her dose of valproic acid and Keppra were adjusted  on 2/18 and I have ordered a valproic acid level for the morning of 2/19.    The patient remains acutely ill with altered mental status requiring life support measures including intubation and mechanical ventilation.  Greater than 32 minutes was spent in the care of this patient today   Lars Masson, MD [facility] [place] Pager: [phone] 06/28/2017, 9:00 AM

## 2017-06-29 LAB — CSF CULTURE
CULTURE: NO GROWTH
GRAM STAIN: NONE SEEN

## 2017-06-29 LAB — BLOOD GAS, ARTERIAL
ACID-BASE EXCESS: 1.2 mmol/L (ref 0.0–2.0)
BICARBONATE: 24.9 mmol/L (ref 20.0–28.0)
DRAWN BY: 419771
FIO2: 40
LHR: 15 {breaths}/min
MECHVT: 450 mL
O2 SAT: 98.8 %
PEEP/CPAP: 5 cmH2O
PH ART: 7.443 (ref 7.350–7.450)
PO2 ART: 133 mmHg — AB (ref 83.0–108.0)
Patient temperature: 98.6
pCO2 arterial: 37 mmHg (ref 32.0–48.0)

## 2017-06-29 LAB — GLUCOSE, CAPILLARY
GLUCOSE-CAPILLARY: 97 mg/dL (ref 65–99)
GLUCOSE-CAPILLARY: 97 mg/dL (ref 65–99)
GLUCOSE-CAPILLARY: 104 mg/dL — AB (ref 65–99)
Glucose-Capillary: 102 mg/dL — ABNORMAL HIGH (ref 65–99)
Glucose-Capillary: 83 mg/dL (ref 65–99)
Glucose-Capillary: 105 mg/dL — ABNORMAL HIGH (ref 65–99)

## 2017-06-29 LAB — COMPREHENSIVE METABOLIC PANEL
ALK PHOS: 66 U/L (ref 38–126)
ALT: 13 U/L — AB (ref 14–54)
ANION GAP: 9 (ref 5–15)
AST: 16 U/L (ref 15–41)
Albumin: 2.1 g/dL — ABNORMAL LOW (ref 3.5–5.0)
BUN: 21 mg/dL — ABNORMAL HIGH (ref 6–20)
CALCIUM: 8.8 mg/dL — AB (ref 8.9–10.3)
CHLORIDE: 111 mmol/L (ref 101–111)
CO2: 24 mmol/L (ref 22–32)
CREATININE: 0.46 mg/dL (ref 0.44–1.00)
Glucose, Bld: 111 mg/dL — ABNORMAL HIGH (ref 65–99)
Potassium: 3.3 mmol/L — ABNORMAL LOW (ref 3.5–5.1)
Sodium: 144 mmol/L (ref 135–145)
Total Bilirubin: 0.3 mg/dL (ref 0.3–1.2)
Total Protein: 4.6 g/dL — ABNORMAL LOW (ref 6.5–8.1)

## 2017-06-29 LAB — CBC WITH DIFFERENTIAL/PLATELET
BASOS ABS: 0 10*3/uL (ref 0.0–0.1)
Basophils Relative: 0 %
EOS PCT: 5 %
Eosinophils Absolute: 0.3 10*3/uL (ref 0.0–0.7)
HEMATOCRIT: 30.4 % — AB (ref 36.0–46.0)
HEMOGLOBIN: 9 g/dL — AB (ref 12.0–15.0)
LYMPHS ABS: 1.2 10*3/uL (ref 0.7–4.0)
Lymphocytes Relative: 23 %
MCH: 25.4 pg — AB (ref 26.0–34.0)
MCHC: 29.6 g/dL — ABNORMAL LOW (ref 30.0–36.0)
MCV: 85.9 fL (ref 78.0–100.0)
Monocytes Absolute: 0.5 10*3/uL (ref 0.1–1.0)
Monocytes Relative: 9 %
NEUTROS ABS: 3.4 10*3/uL (ref 1.7–7.7)
NEUTROS PCT: 63 %
PLATELETS: 193 10*3/uL (ref 150–400)
RBC: 3.54 MIL/uL — ABNORMAL LOW (ref 3.87–5.11)
RDW: 13.5 % (ref 11.5–15.5)
WBC: 5.3 10*3/uL (ref 4.0–10.5)

## 2017-06-29 LAB — VALPROIC ACID LEVEL: Valproic Acid Lvl: 17 ug/mL — ABNORMAL LOW (ref 50.0–100.0)

## 2017-06-29 LAB — PHOSPHORUS
PHOSPHORUS: 2.3 mg/dL — AB (ref 2.5–4.6)
Phosphorus: 2.5 mg/dL (ref 2.5–4.6)

## 2017-06-29 LAB — AMMONIA: Ammonia: 30 umol/L (ref 9–35)

## 2017-06-29 LAB — MAGNESIUM
MAGNESIUM: 1.8 mg/dL (ref 1.7–2.4)
MAGNESIUM: 1.9 mg/dL (ref 1.7–2.4)

## 2017-06-29 LAB — CSF CULTURE W GRAM STAIN

## 2017-06-29 MED ORDER — HYDRALAZINE HCL 20 MG/ML IJ SOLN
5.0000 mg | INTRAMUSCULAR | Status: DC | PRN
  Administered 2017-06-29 – 2017-07-04 (×15): 5 mg via INTRAVENOUS
  Filled 2017-06-29 (×17): qty 1

## 2017-06-29 MED ORDER — KCL-LACTATED RINGERS-D5W 20 MEQ/L IV SOLN
INTRAVENOUS | Status: AC
  Administered 2017-06-29 – 2017-07-01 (×2): via INTRAVENOUS
  Filled 2017-06-29 (×3): qty 1000

## 2017-06-29 MED ORDER — SODIUM CHLORIDE 0.9% FLUSH: 10.0000 mL | INTRAVENOUS | Status: DC | PRN

## 2017-06-29 MED ORDER — FUROSEMIDE 10 MG/ML IJ SOLN
20.0000 mg | Freq: Two times a day (BID) | INTRAMUSCULAR | Status: DC
  Administered 2017-06-29 – 2017-07-02 (×8): 20 mg via INTRAVENOUS
  Filled 2017-06-29 (×9): qty 2

## 2017-06-29 MED ORDER — ALBUMIN HUMAN 25 % IV SOLN
12.5000 g | Freq: Four times a day (QID) | INTRAVENOUS | Status: AC
  Administered 2017-06-29 – 2017-07-01 (×8): 12.5 g via INTRAVENOUS
  Filled 2017-06-29 (×8): qty 50

## 2017-06-29 MED ORDER — FOSFOMYCIN TROMETHAMINE 3 G PO PACK
3.0000 g | PACK | Freq: Once | ORAL | Status: AC
  Administered 2017-06-29: 3 g
  Filled 2017-06-29: qty 3

## 2017-06-29 MED ORDER — SODIUM CHLORIDE 0.9% FLUSH
10.0000 mL | Freq: Two times a day (BID) | INTRAVENOUS | Status: DC
  Administered 2017-06-29 – 2017-06-30 (×2): 30 mL
  Administered 2017-06-30 – 2017-07-03 (×7): 10 mL

## 2017-06-29 MED ORDER — POTASSIUM CHLORIDE 10 MEQ/50ML IV SOLN
10.0000 meq | INTRAVENOUS | Status: AC
Start: 2017-06-29 — End: 2017-06-29
  Administered 2017-06-29 (×5): 10 meq via INTRAVENOUS
  Filled 2017-06-29 (×5): qty 50

## 2017-06-29 MED ORDER — CHLORHEXIDINE GLUCONATE CLOTH 2 % EX PADS
6.0000 | MEDICATED_PAD | Freq: Every day | CUTANEOUS | Status: DC
  Administered 2017-06-29 – 2017-07-04 (×4): 6 via TOPICAL

## 2017-06-29 NOTE — Progress Notes (Signed)
While bathing the pt she showed signs of responsiveness; opening her eyes, purposefully moving her RUE towards her head and giving a her best attempt at showing a "thumbs up" as "following commands". This was all with her Precedex infusing as noted, 0.7 mcg/kg/hr, and it was not adjusted r/t pt strongly "chewing" on her ETT and setting off the ventilator alarm. Will continue to monitor and adjust Precedex towards desired RASS goal.

## 2017-06-29 NOTE — Progress Notes (Signed)
Pharmacy Consult - VPA   67 yof with recent hospitalization in October, discharged to [facility] with trach and PEG, admitted unresponsive and post-ictal. Patient with history of seizures on Keppra and VPA and is on meropenem day #4 inpatient for ESBL Ecoli UTI (CCM treating pseudomonas in resp cx as colonizer). VPA level has continued to trend down despite dose increase to 1g IV q8h on 02/22/17 by Neuro (had been on meropenem previously). Pharmacy consulted to monitor and dose VPA.  VPA and meropenem have a significant DDI resulting in decreased VPA levels and increased risk of seizures. Dr. Pearline Cables asked Pharmacy to consult with ID on best treatment options for this patient. Per discussion with Dr. Baxter Flattery, d/c meropenem for now and give fosfomycin x 1. ID will see patient and provide further recommendations if necessary.  Plan: D/c meropenem and give fosfomycin 3g PT x 1 per discussion with ID Continue VPA at current dose of 1g IV Q8H per Neuro -Check level tomorrow per Neuro F/u for further ID recommendations  Elicia Lamp, PharmD, BCPS Clinical Pharmacist Clinical phone for 06/29/2017 until 3:30pm: [phone] If after 3:30pm, please call main pharmacy at: [phone] 06/29/2017 12:14 PM

## 2017-06-29 NOTE — Progress Notes (Addendum)
Subjective: Patient remains intubated but appears to be doing much better.  Patient did suffer a seizure yesterday.  Depakote level was checked and was subtherapeutic at 17.  This is unclear as she is on 1 g 3 times daily thus we have written an order for pharmacy to evaluate.  Exam: Vitals:   06/29/17 0600 06/29/17 0700  BP: 137/86 (!) 173/133  Pulse: 72 73  Resp:    Temp: 98.6 F (37 C) 99 F (37.2 C)  SpO2: 100% 100%    Physical Exam   HEENT-  Normocephalic, no lesions, without obvious abnormality.  Normal external eye and conjunctiva.   Cardiovascular- S1-S2 audible, pulses palpable throughout   Lungs-no rhonchi or wheezing noted, no excessive working breathing.  Saturations within normal limits Abdomen- All 4 quadrants palpated and nontender Extremities- Warm, dry and intact Musculoskeletal-no joint tenderness, deformity or swelling Skin-warm and dry, no hyperpigmentation, vitiligo, or suspicious lesions    Neuro:  Mental Status: Follows commands Cranial Nerves: II:  Visual fields grossly normal,  III,IV, VI: ptosis not present, extra-ocular motions intact bilaterally pupils equal, round, reactive to light and accommodation V,VII: Face appears symmetric, facial light touch sensation normal bilaterally VIII: hearing normal bilaterally IX,X: uvula rises symmetrically XI: bilateral shoulder shrug XII: midline tongue extension Motor: Moving upper extremities 3/5 and lower extremities 2/5     Medications:  Scheduled: . chlorhexidine gluconate (MEDLINE KIT)  15 mL Mouth Rinse BID  . feeding supplement (PRO-STAT SUGAR FREE 64)  30 mL Per Tube BID  . furosemide  20 mg Intravenous Q12H  . heparin  5,000 Units Subcutaneous Q8H  . mouth rinse  15 mL Mouth Rinse 10 times per day  . pantoprazole (PROTONIX) IV  40 mg Intravenous Daily   Continuous: . sodium chloride 250 mL (06/29/17 0700)  . albumin human    . dexmedetomidine (PRECEDEX) IV infusion 0.6 mcg/kg/hr  (06/29/17 1009)  . dextrose 5% lactated ringers with KCl 20 mEq/L 30 mL/hr at 06/29/17 1010  . feeding supplement (VITAL AF 1.2 CAL) 1,000 mL (06/29/17 0700)  . levETIRAcetam Stopped (06/29/17 0357)  . meropenem (MERREM) IV 1 g (06/29/17 0555)  . midazolam (VERSED) infusion Stopped (06/27/17 0955)  . phenylephrine (NEO-SYNEPHRINE) Adult infusion Stopped (06/26/17 0949)  . potassium chloride 10 mEq (06/29/17 1012)  . valproate sodium 1,000 mg (06/29/17 0555)    Pertinent Labs/Diagnostics: Depakote level 17--unclear why so low as she is on 1 g 3 times daily--have ordered pharmacy to evaluate  Dg Abd 1 View  Result Date: 06/28/2017 CLINICAL DATA:  Orogastric tube placement. EXAM: ABDOMEN - 1 VIEW COMPARISON:  06/25/2016. FINDINGS: Surgical clips noted in the upper abdomen. OG tube noted coiled stomach. Minimally prominent air-filled loops of small large bowel noted. Mild adynamic ileus can't be excluded. Left base atelectasis. IMPRESSION: Orogastric tube noted with tip coiled in the stomach. Electronically Signed   By: Marcello Moores  Register   On: 06/28/2017 14:29   Mr Brain Wo Contrast  Result Date: 06/28/2017  IMPRESSION: 1. No acute intracranial abnormality identified. 2. 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 3. 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 4. Moderate chronic microvascular ischemic changes and moderate parenchymal volume loss of the brain. 5. Right frontal parietal convexity stable meningioma. Minimal local mass effect. No edema in the adjacent brain. Electronically Signed   By: Kristine Garbe M.D.   On: 06/28/2017 14:11   Dg Chest [facility]  1 View  Result Date: 06/28/2017 CLINICAL DATA:  Seizure. EXAM: PORTABLE CHEST 1 VIEW COMPARISON:  Radiograph of June 26, 2017. FINDINGS: Stable cardiomegaly. Endotracheal tube tip is seen at the carina; withdrawal by  3-4 cm is recommended. Interval placement of left subclavian catheter with distal tip in expected position of the SVC. Nasogastric tube is seen in proximal stomach. No pneumothorax is noted. Mild bibasilar subsegmental atelectasis or edema is noted with mild pleural effusions. Bony thorax is unremarkable. IMPRESSION: Endotracheal tube tip is seen at the carina; withdrawal by 3-4 cm is recommended. These results will be called to the ordering clinician or representative by the Radiologist Assistant, and communication documented in the PACS or zVision Dashboard. Interval placement of left subclavian catheter with distal tip in expected position of the SVC. No pneumothorax is noted. Mild bibasilar subsegmental atelectasis or edema is noted with mild bilateral pleural effusions. Electronically Signed   By: Marijo Conception, M.D.   On: 06/28/2017 16:41     Etta Quill PA-C [facility] [phone]  Impression:  Breakthrough seizure in the setting of subtherapeutic antiepileptic medications and and urosepsis.  Recommendations: --We will have pharmacy evaluate Depakote level and what may be causing her Depakote level not to be therapeutic at this time as she is on significant dose of 1 g 3 times daily. --Will consider alternatives to Depakote if it becomes difficult to maintain therapeutic levels --Continue Keppra at current dose --Obtain ammonia level --We will continue to follow  06/29/2017, 10:12 AM  Attending Neurohospitalist Addendum Patient seen and examined with APP/Resident. Agree with the history and physical as documented above. Agree with the plan as documented, which I helped formulate. I have independently reviewed the chart, obtained history, review of systems and examined the patient.I have personally reviewed pertinent head/neck/spine imaging (CT/MRI). Please feel free to call with any questions. --- Amie Portland, MD [facility] Pager: [phone]  If 7pm  to 7am, please call on call as listed on AMION.  CRITICAL CARE ATTESTATION This patient is critically ill and at significant risk of neurological worsening, death and care requires constant monitoring of vital signs, hemodynamics,respiratory and cardiac monitoring. I spent 30  minutes of neurocritical care time performing neurological assessment, discussion with family, other specialists and medical decision making of high complexityin the care of  this patient.

## 2017-06-29 NOTE — Consult Note (Addendum)
[facility]    Date of Admission:  06/26/2017     Total days of antibiotics 4  Day 1 Fosfomycin   Meropenem 2/16 >> 2/19 (seizures persisted)                Reason for Consult: ESBL Ecoli UTI, seizures on meropenem   Referring Provider: PCCM  Active Problems:   Seizures [facility])  Assessment: 82 y.o. female admitted with seizures. She was intubated prophylactically for airway protection and started on treatment for suspected meningitis. CSF benign on cytology and culture. Urine with ESBL ecoli and started on Meropenem for treatment of suspected UTI. She resumed seizure activity while on Meropenem today which is a known possible side effect of carbapenem class as it can lower seizure threshold.   Pseudomonas growing in respiratory aspirate - previously this patient required prolonged ventilation and trach as well as LTAC stay. She has normal procalcitonin, adequate oxygenation and normal respiratory effort. CXR also more c/w CHF.   Plan: 1. Stop Meropenem  2. Would dose Fosfomycin x 1 today and observe for treatment of possible ESBL ecoli UTI - today is 5th day of treatment for this.  3. Would not treat pseudomonas as this is likely a colonizing organism for her and does not seem to be contributing presently.     . chlorhexidine gluconate (MEDLINE KIT)  15 mL Mouth Rinse BID  . feeding supplement (PRO-STAT SUGAR FREE 64)  30 mL Per Tube BID  . furosemide  20 mg Intravenous Q12H  . heparin  5,000 Units Subcutaneous Q8H  . mouth rinse  15 mL Mouth Rinse 10 times per day  . pantoprazole (PROTONIX) IV  40 mg Intravenous Daily    HPI: Melissa Cochran is a 82 y.o. female with past medical history significant for HTN, CHF, COPD, hyperlipidemia, bladder tumor, heart block, CAD, seizures, meningioma. She was admitted on 06/26/2017 with status epilepticus. There was concern for meningitis and started empirically on antibiotics for this as well as  continued home antiepileptic meds (phenytoin, keppra and depacon).   Previously she was hospitalized on 10/7 - 02/22/2017 after being found down unresponsive - concerned for HSV encephalitis at that time. BCx were positive for coagulase negative staph species and deemed a contaminant. UTI with ESBL e coli. Trach and PEG was placed due to lack of improvement of neurologic status and required admission to [facility]. She ultimately underwent decannulation fairly quickly after admission with neuro improvement and was discharged to rehab.   She presented to [facility] ED via EMS on 06/25/17 with prolonged seizures (70mn). She was intubated for airway protection [facility] on 2/16 showed no hemorrhage or fluid collection. Periventricular hypoattenuation c/w chronic microvascular disease. Unchanged 2.1 cm meningioma without underlying abnormality of the brain.   She was placed on Meropenem empirically based on previous sensitivities of most recent urine culture as her U/A showed many bacteria. WBC normal. Initially hypothermic at 94.5 degrees but now with fevers to 100.8 that resolve spontaneously. She was on meropenem up until this afternoon when she started having seizures again.   Review of Systems: Review of Systems  Unable to perform ROS: Intubated    Past Medical History:  Diagnosis Date  . Adnexal mass   . Anxiety   . Arthritis   . Bladder tumor   . Bleeding ulcer   . CHF (congestive heart failure) ([place]   .  CHF (congestive heart failure) ([place])   . COPD (chronic obstructive pulmonary disease) ([place])   . Coronary artery disease   . GERD (gastroesophageal reflux disease)   . Heart block    left  . History of bleeding ulcers   . Hyperlipemia   . Hypertension   . Pneumonia   . Shortness of breath dyspnea     Social History   Tobacco Use  . Smoking status: Former Smoker    Packs/day: 1.00    Years: 40.00    Pack years: 40.00    Last  attempt to quit: 06/13/1998    Years since quitting: 19.0  . Smokeless tobacco: Never Used  Substance Use Topics  . Alcohol use: No  . Drug use: No    Family History  Problem Relation Age of Onset  . Stomach cancer Mother   . CVA Father   . Bladder Cancer Neg Hx   . Kidney cancer Neg Hx    No Known Allergies  OBJECTIVE: Blood pressure (!) 85/50, pulse 85, temperature 99 F (37.2 C), resp. rate 17, height '5\' 4"'$  (1.626 m), weight 186 lb 11.7 oz (84.7 kg), SpO2 100 %.  Physical Exam  Constitutional:  Chronically ill appearing elderly woman on ventilator  HENT:  Head: Normocephalic.  Mouth/Throat: No oral lesions. No dental abscesses.  ETT in place   Eyes: Pupils are equal, round, and reactive to light. No scleral icterus.  Cardiovascular: Normal rate, regular rhythm and normal heart sounds.  No murmur heard. Pulmonary/Chest: Effort normal and breath sounds normal. No respiratory distress. She has no rales.  Abdominal: Soft. She exhibits no distension. There is no tenderness.  Musculoskeletal: Normal range of motion. She exhibits edema. She exhibits no tenderness.  Lymphadenopathy:    She has no cervical adenopathy.  Neurological:  Intubated - follows commands. Does not make much effort to open eyes   Skin: Skin is warm and dry. No rash (generalized ) noted.  Vitals reviewed.   Lab Results Lab Results  Component Value Date   WBC 5.3 06/29/2017   HGB 9.0 (L) 06/29/2017   HCT 30.4 (L) 06/29/2017   MCV 85.9 06/29/2017   PLT 193 06/29/2017    Lab Results  Component Value Date   CREATININE 0.46 06/29/2017   BUN 21 (H) 06/29/2017   NA 144 06/29/2017   K 3.3 (L) 06/29/2017   CL 111 06/29/2017   CO2 24 06/29/2017    Lab Results  Component Value Date   ALT 13 (L) 06/29/2017   AST 16 06/29/2017   ALKPHOS 66 06/29/2017   BILITOT 0.3 06/29/2017     Microbiology: Urine Cx 2/16 >> ESBL Ecoli  CSF 2/16 >> no growth on culture, WBC normal, Glucose/Protein normal.    Sputum 2/16 >> pseudomonas (R-cipro) Blood Cx 2/16 >> NG x 4d 2/2   Janene Madeira, MSN, NP-C [facility] for Infectious [place] Cell: [phone] Pager: [phone]  06/29/2017 1:31 PM

## 2017-06-29 NOTE — Progress Notes (Signed)
PULMONARY / CRITICAL CARE MEDICINE   Name: Melissa Cochran MRN: [id number] DOB: 03-21-1936    ADMISSION DATE:  06/26/2017   CHIEF COMPLAINT:  Unresponsive, post ictal  HISTORY OF PRESENT ILLNESS:      82 year old female with PMH of HTN, CHF, COPD, HLD, CAD, Seizures, Meningioma   Recent Hospitalization 10/7-10/16. Found unresponsive, intubated, concerning for seizures. 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 Blood cultures positive for staph, however believed to be contaminant. UTI due to ESBL E.Coli. EEG with sharp activity over the right hemisphere which suggests focal disturbance. Ultimately Trach and PEG was placed as mental status did not improve. Discharged to [facility]. Decannulation shortly after due to improvement. Went to [facility], since has been weak and wheelchair bound, able to feed self, alert and oriented.   Presented to [facility] ED on 2/16 with reported seizure activity for 45 minutes. Given 4 mg versed per EMS. Upon arrival to ED given 2 mg of Ativan without improvement. Intubated for airway protection and transferred to [facility].  She remains intubated and mechanically ventilated this morning. EEG shows periodic discharges but no overt seizure activity.Yesterday she had generalized tonic clonic activity in MRI. Stopped with one dose of Ativan. She is still sedated with Precedex this am, but is following some simple instructions.   PAST MEDICAL HISTORY :  She  has a past medical history of Adnexal mass, Anxiety, Arthritis, Bladder tumor, Bleeding ulcer, CHF (congestive heart failure) ([place]), CHF (congestive heart failure) ([place]), COPD (chronic obstructive pulmonary disease) ([place]), Coronary artery disease, GERD (gastroesophageal reflux disease), Heart block, History of bleeding ulcers, Hyperlipemia, Hypertension, Pneumonia, and Shortness of breath dyspnea.  PAST SURGICAL HISTORY: She  has a past surgical history that includes Cardiac surgery; Abdominal hysterectomy; Stomach  surgery; Hip surgery; Cardiac catheterization (Left, 10/21/2015); Cardiac catheterization (N/A, 10/21/2015); Transurethral resection of bladder tumor (N/A, 01/20/2016); Cystoscopy w/ retrogrades (Bilateral, 01/20/2016); Cystoscopy with stent placement (Right, 01/20/2016); and Tracheostomy tube placement (N/A, 03/02/2017).  No Known Allergies  No current facility-administered medications on file prior to encounter.    Current Outpatient Medications on File Prior to Encounter  Medication Sig  . acetaminophen (TYLENOL) 325 MG tablet Take 2 tablets (650 mg total) by mouth every 6 (six) hours as needed for mild pain (or Fever >/= 101).  . Amino Acids-Protein Hydrolys (FEEDING SUPPLEMENT, PRO-STAT SUGAR FREE 64,) LIQD Place 30 mLs into feeding tube 2 (two) times daily. (Patient taking differently: Take 30 mLs by mouth 3 (three) times daily. )  . amLODipine (NORVASC) 10 MG tablet Take 10 mg by mouth daily.  Marland Kitchen aspirin EC 81 MG tablet Take 81 mg by mouth daily.  Marland Kitchen atorvastatin (LIPITOR) 10 MG tablet Take 10 mg by mouth at bedtime.  . carvedilol (COREG) 6.25 MG tablet Take 6.25 mg by mouth 2 (two) times daily.  Marland Kitchen levETIRAcetam (KEPPRA) 750 MG tablet Take 750 mg by mouth 2 (two) times daily.  . Melatonin 3 MG TABS Take 3 mg by mouth at bedtime.  . Multiple Vitamin (THEREMS) TABS Take 1 tablet by mouth daily.  . sertraline (ZOLOFT) 25 MG tablet Take 25 mg by mouth daily.  . tamsulosin (FLOMAX) 0.4 MG CAPS capsule Take 0.4 mg by mouth daily.  . Valproate Sodium (DEPAKENE) 250 MG/5ML SOLN solution Take 500 mg by mouth 2 (two) times daily.  Marland Kitchen acetaminophen (TYLENOL) 650 MG suppository Place 1 suppository (650 mg total) rectally every 6 (six) hours as needed for mild pain (or Fever >/= 101). (Patient not taking: Reported  on 06/26/2017)  . acyclovir 650 mg in dextrose 5 % 100 mL Inject 650 mg into the vein daily. (Patient not taking: Reported on 06/26/2017)  . albuterol (PROVENTIL) (2.5 MG/3ML) 0.083% nebulizer  solution Take 3 mLs (2.5 mg total) by nebulization every 2 (two) hours as needed for wheezing. (Patient not taking: Reported on 06/26/2017)  . chlorhexidine gluconate, MEDLINE KIT, (PERIDEX) 0.12 % solution 15 mLs by Mouth Rinse route 2 (two) times daily. (Patient not taking: Reported on 06/26/2017)  . hydrALAZINE (APRESOLINE) 20 MG/ML injection Inject 0.5 mLs (10 mg total) into the vein every 6 (six) hours as needed (SBP>180 or DBP>100). (Patient not taking: Reported on 06/26/2017)  . levETIRAcetam 500 mg in sodium chloride 0.9 % 100 mL Inject 500 mg into the vein every 12 (twelve) hours. (Patient not taking: Reported on 06/26/2017)  . meropenem 1 g in sodium chloride 0.9 % 100 mL Inject 1 g into the vein every 12 (twelve) hours. (Patient not taking: Reported on 06/26/2017)  . metoprolol tartrate (LOPRESSOR) 5 MG/5ML SOLN injection Inject 2.5-5 mLs (2.5-5 mg total) into the vein every 6 (six) hours as needed (for heart rate >120). (Patient not taking: Reported on 06/26/2017)  . mouth rinse LIQD solution 15 mLs by Mouth Rinse route as needed. (Patient not taking: Reported on 06/26/2017)  . Multiple Vitamin (MULTIVITAMIN) LIQD Place 15 mLs into feeding tube daily. (Patient not taking: Reported on 06/26/2017)  . Nutritional Supplements (FEEDING SUPPLEMENT, VITAL 1.5 CAL,) LIQD Place 1,000 mLs into feeding tube daily. (Patient not taking: Reported on 06/26/2017)  . ondansetron (ZOFRAN) 4 MG/2ML SOLN injection Inject 2 mLs (4 mg total) into the vein every 6 (six) hours as needed for nausea. (Patient not taking: Reported on 06/26/2017)  . pantoprazole sodium (PROTONIX) 40 mg/20 mL PACK Place 20 mLs (40 mg total) into feeding tube daily at 12 noon. (Patient not taking: Reported on 06/26/2017)  . senna-docusate (SENOKOT-S) 8.6-50 MG tablet Place 1 tablet into feeding tube 2 (two) times daily. (Patient not taking: Reported on 06/26/2017)  . valproate 1,000 mg in dextrose 5 % 50 mL Inject 1,000 mg into the vein every 8  (eight) hours. (Patient not taking: Reported on 06/26/2017)  . Water For Irrigation, Sterile (FREE WATER) SOLN Place 100 mLs into feeding tube every 8 (eight) hours. (Patient not taking: Reported on 06/26/2017)    FAMILY HISTORY:  Her indicated that her mother is deceased. She indicated that her father is deceased. She indicated that the status of her neg hx is unknown.   SOCIAL HISTORY: She  reports that she quit smoking about 19 years ago. She has a 40.00 pack-year smoking history. she has never used smokeless tobacco. She reports that she does not drink alcohol or use drugs.  REVIEW OF SYSTEMS:   Not obtainable  SUBJECTIVE:  As above  VITAL SIGNS: BP (!) 173/133   Pulse 73   Temp 99 F (37.2 C)   Resp (!) 23   Ht _0  (1.626 m)   Wt 186 lb 11.7 oz (84.7 kg)   SpO2 100%   BMI 32.05 kg/m   HEMODYNAMICS:    VENTILATOR SETTINGS: Vent Mode: PRVC FiO2 (%):  [30 %-40 %] 40 % Set Rate:  [15 bmp] 15 bmp Vt Set:  [450 mL] 450 mL PEEP:  [5 cmH20] 5 cmH20 Pressure Support:  [12 cmH20] 12 cmH20 Plateau Pressure:  [18 cmH20-28 cmH20] 28 cmH20  INTAKE / OUTPUT: I/O last 3 completed shifts: In: 6102.5 [I.V.:3657.5; PR/FF:6384; IV  Piggyback:780] Out: 1120 [JSHFW:2637]  PHYSICAL EXAMINATION: General: Chronically ill-appearing elderly female who is intubated and mechanically ventilated she is in no overt distress. Tonic contracture of the neck to the right. Neuro: She does not open eyes to voice, but does follow simple instructions.   She spontaneoulsymoves both upper extremities, but more active on the right. pupils equal..   Cardiovascular: S1 and S2 are somewhat rapid with frequent ectopics.  There is no murmur rub or gallop. Lungs: She is breathing comfortably on PS 12 .Rrespirations are unlabored, there is symmetric air movement, few scattered rhonchi, no wheezes. Abdomen: The abdomen is obese, soft without any organomegaly masses tenderness 1+ anasarca LABS:  BMET Recent  Labs  Lab 06/26/17 0319 06/28/17 0915 06/29/17 0443  NA 142 143 144  K 4.0 4.5 3.3*  CL 109 109 111  CO2 _0 BUN 19 21* 21*  CREATININE 0.48 0.53 0.46  GLUCOSE 118* 127* 111*    Electrolytes Recent Labs  Lab 06/26/17 0319 06/28/17 0915 06/29/17 0443  CALCIUM 8.4* 8.9 8.8*  MG 1.7 2.0 1.8  PHOS 3.6 3.0 2.5    CBC Recent Labs  Lab 06/25/17 2037 06/26/17 0319 06/29/17 0443  WBC 6.0 7.0 5.3  HGB 10.6* 9.1* 9.0*  HCT 33.8* 30.8* 30.4*  PLT 270 259 193    Coag's Recent Labs  Lab 06/25/17 2104  INR 1.18    Sepsis Markers Recent Labs  Lab 06/25/17 2037 06/26/17 0319 06/27/17 0232  LATICACIDVEN 2.2* 2.5*  --   PROCALCITON <0.10  --  <0.10    ABG Recent Labs  Lab 06/26/17 0210 06/26/17 0416 06/29/17 0345  PHART 7.303* 7.390 7.443  PCO2ART 50.4* 41.9 37.0  PO2ART 71.0* 88.2 133*    Liver Enzymes Recent Labs  Lab 06/25/17 2037 06/29/17 0443  AST 31 16  ALT 13* 13*  ALKPHOS 72 66  BILITOT 0.7 0.3  ALBUMIN 3.2* 2.1*    Cardiac Enzymes Recent Labs  Lab 06/25/17 2037  TROPONINI <0.03    Glucose Recent Labs  Lab 06/28/17 1136 06/28/17 1600 06/28/17 1957 06/28/17 2320 06/29/17 0338 06/29/17 0817  GLUCAP 101* 114* 133* 106* 97 102*    Imaging Dg Abd 1 View  Result Date: 06/28/2017 CLINICAL DATA:  Orogastric tube placement. EXAM: ABDOMEN - 1 VIEW COMPARISON:  06/25/2016. FINDINGS: Surgical clips noted in the upper abdomen. OG tube noted coiled stomach. Minimally prominent air-filled loops of small large bowel noted. Mild adynamic ileus can't be excluded. Left base atelectasis. IMPRESSION: Orogastric tube noted with tip coiled in the stomach. Electronically Signed   By: Marcello Moores  Register   On: 06/28/2017 14:29   Mr Brain Wo Contrast  Result Date: 06/28/2017 CLINICAL DATA:  82 y/o F; history of seizures with possible breakthrough seizure. Urosepsis. EXAM: MRI HEAD WITHOUT CONTRAST TECHNIQUE: Multiplanar, multiecho pulse sequences  of the brain and surrounding structures were obtained without intravenous contrast. COMPARISON:  06/25/2017 CT head.  September 29, 202018 MRI head. FINDINGS: Brain: Small foci of hemosiderin stained encephalomalacia are present in the right parietal and occipital lobes corresponding to prior areas of infarction on the prior MRI. No reduced diffusion to suggest acute/early subacute infarction. No new susceptibility hypointensity to indicate interval intracranial hemorrhage. No hydrocephalus, extra-axial collection, or effacement of basilar cisterns. Severalnonspecific foci of T2 FLAIR hyperintense signal abnormality in subcortical and periventricular white matter are compatible withmoderatechronic microvascular ischemic changes for age. Moderatebrain parenchymal volume loss. Interval loss of volume in right hippocampus and increased signal. Stable size and signal  of the left hippocampus. Right frontoparietal convexity extra-axial mass measuring 2.1 x 1.9 x 2.2 cm (AP x ML x CC series 5, image 4 and series 8, image 19) with intermediate T1 and increased T2 signal as well as speckled susceptibility hypointensity compatible with mineralization, stable from prior studies. Vascular: Normal flow voids. Skull and upper cervical spine: Normal marrow signal. Sinuses/Orbits: Mildly increased signal within paranasal sinus mucosal and mastoid air cells, likely due to intubation. Tiny left maxillary sinus mucous retention cyst. Orbits are unremarkable. Other: None. IMPRESSION: 1. No acute intracranial abnormality identified. 2. 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 3. 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 4. Moderate chronic microvascular ischemic changes and moderate parenchymal volume loss of the brain. 5. Right frontal parietal convexity stable meningioma. Minimal local mass effect. No edema in the adjacent  brain. Electronically Signed   By: Kristine Garbe M.D.   On: 06/28/2017 14:11   Dg Chest Port 1 View  Result Date: 06/28/2017 CLINICAL DATA:  Seizure. EXAM: PORTABLE CHEST 1 VIEW COMPARISON:  Radiograph of June 26, 2017. FINDINGS: Stable cardiomegaly. Endotracheal tube tip is seen at the carina; withdrawal by 3-4 cm is recommended. Interval placement of left subclavian catheter with distal tip in expected position of the SVC. Nasogastric tube is seen in proximal stomach. No pneumothorax is noted. Mild bibasilar subsegmental atelectasis or edema is noted with mild pleural effusions. Bony thorax is unremarkable. IMPRESSION: Endotracheal tube tip is seen at the carina; withdrawal by 3-4 cm is recommended. These results will be called to the ordering clinician or representative by the Radiologist Assistant, and communication documented in the PACS or zVision Dashboard. Interval placement of left subclavian catheter with distal tip in expected position of the SVC. No pneumothorax is noted. Mild bibasilar subsegmental atelectasis or edema is noted with mild bilateral pleural effusions. Electronically Signed   By: Marijo Conception, M.D.   On: 06/28/2017 16:41      CULTURES: Urine obtained at [place] is growing greater than 10 to the fifth gram-negative rods  ANTIBIOTICS: Meropenem started on 2/17   DISCUSSION:      This is an 82 year old with a prior history of ESBL producing gram-negative UTI and a known right convexity meningioma who was transferred to this hospital after prolonged seizure. No clinical or EEG evidence of seizures overnight.  ASSESSMENT / PLAN:  PULMONARY A: Chest x-ray does not show evolution of a infiltrate to my eye.  Her gas exchange and mechanics are  quite good and it is only her mental status ias somewhat better this am.  We are attempting an SBT this morning  CARDIOVASCULAR A: Now appears to be in atrial fibrillation.  I will be adding a beta-blocker if  necessary for rate control. Her rate is controlled now, but she's on precedex. Echo shows mild LV systolic dysfunction, sequentially adding lasix and afterload reduction.   GASTROINTESTINAL A: Prophylaxis is with Protonix  INFECTIOUS A: Continue meropenem. I see no urine culture back. She has a history of ESBL producing E. coli  NEUROLOGIC A: Mental status is improved, suspect a resolving  Post ictal state. Adjustment of AED's per Neurology.     Lars Masson, MD Pulmonary and [place] Pager: [phone] 06/29/2017, 8:37 AM

## 2017-06-30 DIAGNOSIS — Z9911 Dependence on respirator [ventilator] status: Secondary | ICD-10-CM

## 2017-06-30 DIAGNOSIS — N390 Urinary tract infection, site not specified: Secondary | ICD-10-CM

## 2017-06-30 DIAGNOSIS — B9620 Unspecified Escherichia coli [E. coli] as the cause of diseases classified elsewhere: Secondary | ICD-10-CM

## 2017-06-30 DIAGNOSIS — I4891 Unspecified atrial fibrillation: Secondary | ICD-10-CM

## 2017-06-30 LAB — CBC WITH DIFFERENTIAL/PLATELET
BASOS ABS: 0 10*3/uL (ref 0.0–0.1)
Basophils Relative: 0 %
EOS PCT: 7 %
Eosinophils Absolute: 0.4 10*3/uL (ref 0.0–0.7)
HCT: 30 % — ABNORMAL LOW (ref 36.0–46.0)
Hemoglobin: 8.9 g/dL — ABNORMAL LOW (ref 12.0–15.0)
LYMPHS PCT: 40 %
Lymphs Abs: 2.1 10*3/uL (ref 0.7–4.0)
MCH: 25.7 pg — AB (ref 26.0–34.0)
MCHC: 29.7 g/dL — ABNORMAL LOW (ref 30.0–36.0)
MCV: 86.7 fL (ref 78.0–100.0)
MONO ABS: 0.6 10*3/uL (ref 0.1–1.0)
Monocytes Relative: 12 %
Neutro Abs: 2.1 10*3/uL (ref 1.7–7.7)
Neutrophils Relative %: 41 %
PLATELETS: 195 10*3/uL (ref 150–400)
RBC: 3.46 MIL/uL — ABNORMAL LOW (ref 3.87–5.11)
RDW: 13.9 % (ref 11.5–15.5)
WBC: 5.2 10*3/uL (ref 4.0–10.5)

## 2017-06-30 LAB — CULTURE, BLOOD (ROUTINE X 2)
Culture: NO GROWTH
Culture: NO GROWTH
Special Requests: ADEQUATE
Special Requests: ADEQUATE

## 2017-06-30 LAB — MAGNESIUM
MAGNESIUM: 1.9 mg/dL (ref 1.7–2.4)
Magnesium: 1.9 mg/dL (ref 1.7–2.4)

## 2017-06-30 LAB — BASIC METABOLIC PANEL
Anion gap: 8 (ref 5–15)
BUN: 21 mg/dL — AB (ref 6–20)
CALCIUM: 9.1 mg/dL (ref 8.9–10.3)
CO2: 26 mmol/L (ref 22–32)
CREATININE: 0.42 mg/dL — AB (ref 0.44–1.00)
Chloride: 109 mmol/L (ref 101–111)
GFR calc Af Amer: >60 mL/min (ref >60)
GLUCOSE: 95 mg/dL (ref 65–99)
Potassium: 3.6 mmol/L (ref 3.5–5.1)
Sodium: 143 mmol/L (ref 135–145)

## 2017-06-30 LAB — GLUCOSE, CAPILLARY
GLUCOSE-CAPILLARY: 68 mg/dL (ref 65–99)
GLUCOSE-CAPILLARY: 81 mg/dL (ref 65–99)
Glucose-Capillary: 104 mg/dL — ABNORMAL HIGH (ref 65–99)
Glucose-Capillary: 89 mg/dL (ref 65–99)
Glucose-Capillary: 91 mg/dL (ref 65–99)
Glucose-Capillary: 96 mg/dL (ref 65–99)
Glucose-Capillary: 97 mg/dL (ref 65–99)

## 2017-06-30 LAB — PHOSPHORUS
Phosphorus: 2.5 mg/dL (ref 2.5–4.6)
Phosphorus: 2.4 mg/dL — ABNORMAL LOW (ref 2.5–4.6)

## 2017-06-30 LAB — VALPROIC ACID LEVEL: Valproic Acid Lvl: 15 ug/mL — ABNORMAL LOW (ref 50.0–100.0)

## 2017-06-30 MED ORDER — LABETALOL HCL 5 MG/ML IV SOLN
10.0000 mg | INTRAVENOUS | Status: AC | PRN
  Administered 2017-06-30 – 2017-07-03 (×10): 20 mg via INTRAVENOUS
  Filled 2017-06-30 (×7): qty 4

## 2017-06-30 MED ORDER — IPRATROPIUM BROMIDE 0.02 % IN SOLN
0.5000 mg | Freq: Four times a day (QID) | RESPIRATORY_TRACT | Status: DC
  Administered 2017-06-30 – 2017-07-04 (×16): 0.5 mg via RESPIRATORY_TRACT
  Filled 2017-06-30 (×17): qty 2.5

## 2017-06-30 MED ORDER — DEXTROSE 50 % IV SOLN
25.0000 mL | Freq: Once | INTRAVENOUS | Status: AC
  Administered 2017-06-30: 25 mL via INTRAVENOUS
  Filled 2017-06-30: qty 50

## 2017-06-30 MED ORDER — AMLODIPINE BESYLATE 10 MG PO TABS
10.0000 mg | ORAL_TABLET | Freq: Every day | ORAL | Status: DC
  Administered 2017-07-02 – 2017-07-08 (×7): 10 mg via ORAL
  Filled 2017-06-30 (×7): qty 1

## 2017-06-30 MED ORDER — IPRATROPIUM-ALBUTEROL 0.5-2.5 (3) MG/3ML IN SOLN
3.0000 mL | Freq: Four times a day (QID) | RESPIRATORY_TRACT | Status: DC
  Administered 2017-06-30: 3 mL via RESPIRATORY_TRACT
  Filled 2017-06-30 (×2): qty 3

## 2017-06-30 MED ORDER — METOPROLOL TARTRATE 5 MG/5ML IV SOLN
5.0000 mg | Freq: Once | INTRAVENOUS | Status: AC
  Administered 2017-06-30: 5 mg via INTRAVENOUS
  Filled 2017-06-30: qty 5

## 2017-06-30 MED ORDER — METOPROLOL TARTRATE 5 MG/5ML IV SOLN
2.5000 mg | Freq: Four times a day (QID) | INTRAVENOUS | Status: DC
  Administered 2017-06-30 – 2017-07-06 (×24): 2.5 mg via INTRAVENOUS
  Filled 2017-06-30 (×24): qty 5

## 2017-06-30 MED ORDER — ORAL CARE MOUTH RINSE
15.0000 mL | Freq: Two times a day (BID) | OROMUCOSAL | Status: DC
  Administered 2017-07-01 – 2017-07-04 (×7): 15 mL via OROMUCOSAL

## 2017-06-30 MED ORDER — SODIUM CHLORIDE 0.9 % IV SOLN
1000.0000 mg | Freq: Two times a day (BID) | INTRAVENOUS | Status: DC
  Administered 2017-07-01 – 2017-07-06 (×11): 1000 mg via INTRAVENOUS
  Filled 2017-06-30 (×13): qty 10

## 2017-06-30 NOTE — Progress Notes (Signed)
Hypoglycemic Event  CBG: 68  Treatment: D50 IV 25 mL  Symptoms: None  Follow-up CBG: Time:1645 CBG Result:89  Possible Reasons for Event: Inadequate meal intake  Comments/MD notified: Dr. Pearline Cables notified on the unit.      Virgene Tirone, Rande Brunt

## 2017-06-30 NOTE — Progress Notes (Signed)
Pt extubated per MD order. Pt tol very well. Able to vocalize right after extubation, asking about her family. Pt placed on 4L [place] and will wean as tol. Will cont to monitor

## 2017-06-30 NOTE — Care Management Note (Signed)
Case Management Note  Patient Details  Name: Melissa Cochran MRN: [id number] Date of Birth: 04-May-1936  Subjective/Objective:  Pt admitted on 06/26/17 with prolonged seizure activity.   PTA, pt resided at [facility].              Action/Plan: Pt extubated today, 06/30/17.  Will need PT/OT consults when able to tolerate therapies.  Will consult CSW to follow to facilitate possible return to SNF upon dc.    Expected Discharge Date:                  Expected Discharge Plan:  Skilled Nursing Facility  In-House Referral:  Clinical Social Work  Discharge planning Services  CM Consult  Post Acute Care Choice:    Choice offered to:     DME Arranged:    DME Agency:     HH Arranged:    [place] Agency:     Status of Service:  In process, will continue to follow  If discussed at Long Length of Stay Meetings, dates discussed:    Additional Comments:  Reinaldo Raddle, RN, BSN  Trauma/Neuro ICU Case Manager [phone]

## 2017-06-30 NOTE — Progress Notes (Signed)
eLink Physician-Brief Progress Note Patient Name: ENORA TRILLO DOB: November 26, 1935 MRN: [id number]   Date of Service  06/30/2017  HPI/Events of Note  Diarrhea, nurse requesting flexiseal  eICU Interventions  flexiseal     Intervention Category Minor Interventions: Other:  Sharia Reeve 06/30/2017, 8:17 PM

## 2017-06-30 NOTE — Progress Notes (Signed)
PULMONARY / CRITICAL CARE MEDICINE   Name: Melissa Cochran MRN: [id number] DOB: 1935-06-20    ADMISSION DATE:  06/26/2017   CHIEF COMPLAINT:  Unresponsive, post ictal  HISTORY OF PRESENT ILLNESS:      82 year old female with PMH of HTN, CHF, COPD, HLD, CAD, Seizures, Meningioma   Recent Hospitalization 10/7-10/16. Found unresponsive, intubated, concerning for seizures. 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 Blood cultures positive for staph, however believed to be contaminant. UTI due to ESBL E.Coli. EEG with sharp activity over the right hemisphere which suggests focal disturbance. Ultimately Trach and PEG was placed as mental status did not improve. Discharged to [facility]. Decannulation shortly after due to improvement. Went to [facility], since has been weak and wheelchair bound, able to feed self, alert and oriented.   Presented to [facility] ED on 2/16 with reported seizure activity for 45 minutes. Given 4 mg versed per EMS. Upon arrival to ED given 2 mg of Ativan without improvement. Intubated for airway protection and transferred to [facility].  She remains intubated and mechanically ventilated.  Sedated on Precedex this morning and becomes extremely agitated when stimulated but does not interact.   PAST MEDICAL HISTORY :  She  has a past medical history of Adnexal mass, Anxiety, Arthritis, Bladder tumor, Bleeding ulcer, CHF (congestive heart failure) ([place]), CHF (congestive heart failure) ([place]), COPD (chronic obstructive pulmonary disease) ([place]), Coronary artery disease, GERD (gastroesophageal reflux disease), Heart block, History of bleeding ulcers, Hyperlipemia, Hypertension, Pneumonia, and Shortness of breath dyspnea.  PAST SURGICAL HISTORY: She  has a past surgical history that includes Cardiac surgery; Abdominal hysterectomy; Stomach surgery; Hip surgery; Cardiac catheterization (Left, 10/21/2015); Cardiac catheterization (N/A, 10/21/2015); Transurethral resection of bladder tumor  (N/A, 01/20/2016); Cystoscopy w/ retrogrades (Bilateral, 01/20/2016); Cystoscopy with stent placement (Right, 01/20/2016); and Tracheostomy tube placement (N/A, 03/02/2017).  No Known Allergies  No current facility-administered medications on file prior to encounter.    Current Outpatient Medications on File Prior to Encounter  Medication Sig  . acetaminophen (TYLENOL) 325 MG tablet Take 2 tablets (650 mg total) by mouth every 6 (six) hours as needed for mild pain (or Fever >/= 101).  . Amino Acids-Protein Hydrolys (FEEDING SUPPLEMENT, PRO-STAT SUGAR FREE 64,) LIQD Place 30 mLs into feeding tube 2 (two) times daily. (Patient taking differently: Take 30 mLs by mouth 3 (three) times daily. )  . amLODipine (NORVASC) 10 MG tablet Take 10 mg by mouth daily.  Marland Kitchen aspirin EC 81 MG tablet Take 81 mg by mouth daily.  Marland Kitchen atorvastatin (LIPITOR) 10 MG tablet Take 10 mg by mouth at bedtime.  . carvedilol (COREG) 6.25 MG tablet Take 6.25 mg by mouth 2 (two) times daily.  Marland Kitchen levETIRAcetam (KEPPRA) 750 MG tablet Take 750 mg by mouth 2 (two) times daily.  . Melatonin 3 MG TABS Take 3 mg by mouth at bedtime.  . Multiple Vitamin (THEREMS) TABS Take 1 tablet by mouth daily.  . sertraline (ZOLOFT) 25 MG tablet Take 25 mg by mouth daily.  . tamsulosin (FLOMAX) 0.4 MG CAPS capsule Take 0.4 mg by mouth daily.  . Valproate Sodium (DEPAKENE) 250 MG/5ML SOLN solution Take 500 mg by mouth 2 (two) times daily.  Marland Kitchen acetaminophen (TYLENOL) 650 MG suppository Place 1 suppository (650 mg total) rectally every 6 (six) hours as needed for mild pain (or Fever >/= 101). (Patient not taking: Reported on 06/26/2017)  . acyclovir 650 mg in dextrose 5 % 100 mL Inject 650 mg into the vein daily. (Patient not taking:  Reported on 06/26/2017)  . albuterol (PROVENTIL) (2.5 MG/3ML) 0.083% nebulizer solution Take 3 mLs (2.5 mg total) by nebulization every 2 (two) hours as needed for wheezing. (Patient not taking: Reported on 06/26/2017)  .  chlorhexidine gluconate, MEDLINE KIT, (PERIDEX) 0.12 % solution 15 mLs by Mouth Rinse route 2 (two) times daily. (Patient not taking: Reported on 06/26/2017)  . hydrALAZINE (APRESOLINE) 20 MG/ML injection Inject 0.5 mLs (10 mg total) into the vein every 6 (six) hours as needed (SBP>180 or DBP>100). (Patient not taking: Reported on 06/26/2017)  . levETIRAcetam 500 mg in sodium chloride 0.9 % 100 mL Inject 500 mg into the vein every 12 (twelve) hours. (Patient not taking: Reported on 06/26/2017)  . meropenem 1 g in sodium chloride 0.9 % 100 mL Inject 1 g into the vein every 12 (twelve) hours. (Patient not taking: Reported on 06/26/2017)  . metoprolol tartrate (LOPRESSOR) 5 MG/5ML SOLN injection Inject 2.5-5 mLs (2.5-5 mg total) into the vein every 6 (six) hours as needed (for heart rate >120). (Patient not taking: Reported on 06/26/2017)  . mouth rinse LIQD solution 15 mLs by Mouth Rinse route as needed. (Patient not taking: Reported on 06/26/2017)  . Multiple Vitamin (MULTIVITAMIN) LIQD Place 15 mLs into feeding tube daily. (Patient not taking: Reported on 06/26/2017)  . Nutritional Supplements (FEEDING SUPPLEMENT, VITAL 1.5 CAL,) LIQD Place 1,000 mLs into feeding tube daily. (Patient not taking: Reported on 06/26/2017)  . ondansetron (ZOFRAN) 4 MG/2ML SOLN injection Inject 2 mLs (4 mg total) into the vein every 6 (six) hours as needed for nausea. (Patient not taking: Reported on 06/26/2017)  . pantoprazole sodium (PROTONIX) 40 mg/20 mL PACK Place 20 mLs (40 mg total) into feeding tube daily at 12 noon. (Patient not taking: Reported on 06/26/2017)  . senna-docusate (SENOKOT-S) 8.6-50 MG tablet Place 1 tablet into feeding tube 2 (two) times daily. (Patient not taking: Reported on 06/26/2017)  . valproate 1,000 mg in dextrose 5 % 50 mL Inject 1,000 mg into the vein every 8 (eight) hours. (Patient not taking: Reported on 06/26/2017)  . Water For Irrigation, Sterile (FREE WATER) SOLN Place 100 mLs into feeding tube every  8 (eight) hours. (Patient not taking: Reported on 06/26/2017)    FAMILY HISTORY:  Her indicated that her mother is deceased. She indicated that her father is deceased. She indicated that the status of her neg hx is unknown.   SOCIAL HISTORY: She  reports that she quit smoking about 19 years ago. She has a 40.00 pack-year smoking history. she has never used smokeless tobacco. She reports that she does not drink alcohol or use drugs.  REVIEW OF SYSTEMS:   Not obtainable  SUBJECTIVE:  As above  VITAL SIGNS: BP (!) 143/74   Pulse 70   Temp 98.2 F (36.8 C)   Resp 15   Ht '5\' 4"'$  (1.626 m)   Wt 183 lb 10.3 oz (83.3 kg)   SpO2 100%   BMI 31.52 kg/m   HEMODYNAMICS:    VENTILATOR SETTINGS: Vent Mode: PRVC FiO2 (%):  [40 %] 40 % Set Rate:  [15 bmp] 15 bmp Vt Set:  [450 mL] 450 mL PEEP:  [5 cmH20] 5 cmH20 Plateau Pressure:  [16 cmH20-18 cmH20] 16 cmH20  INTAKE / OUTPUT: I/O last 3 completed shifts: In: 4891.1 [I.V.:2326.1; NG/GT:1575; IV Piggyback:990] Out: 2816 [Urine:2815; Stool:1]  PHYSICAL EXAMINATION: General: Chronically ill-appearing elderly female who is intubated and mechanically ventilated she is in no overt distress. . Neuro: She does not open eyes  to voice, or to noxious stimuli.  She vigorously moves all fours in response to noxious stimulus.   Cardiovascular: S1 and S2 are regular without murmur rub or gallop  Lungs: Respirations are unlabored, minute ventilation at rest is only 6 L/min.  there is symmetric air movement, few scattered rhonchi, few scattered wheezes.   Abdomen: The abdomen is obese, soft without any organomegaly masses tenderness 1+ anasarca LABS:  BMET Recent Labs  Lab 06/28/17 0915 06/29/17 0443 06/30/17 0338  NA 143 144 143  K 4.5 3.3* 3.6  CL 109 111 109  CO2 '24 24 26  '$ BUN 21* 21* 21*  CREATININE 0.53 0.46 0.42*  GLUCOSE 127* 111* 95    Electrolytes Recent Labs  Lab 06/28/17 0915 06/29/17 0443 06/29/17 1707 06/30/17 0338   CALCIUM 8.9 8.8*  --  9.1  MG 2.0 1.8 1.9 1.9  PHOS 3.0 2.5 2.3* 2.5    CBC Recent Labs  Lab 06/26/17 0319 06/29/17 0443 06/30/17 0338  WBC 7.0 5.3 5.2  HGB 9.1* 9.0* 8.9*  HCT 30.8* 30.4* 30.0*  PLT 259 193 195    Coag's Recent Labs  Lab 06/25/17 2104  INR 1.18    Sepsis Markers Recent Labs  Lab 06/25/17 2037 06/26/17 0319 06/27/17 0232  LATICACIDVEN 2.2* 2.5*  --   PROCALCITON <0.10  --  <0.10    ABG Recent Labs  Lab 06/26/17 0210 06/26/17 0416 06/29/17 0345  PHART 7.303* 7.390 7.443  PCO2ART 50.4* 41.9 37.0  PO2ART 71.0* 88.2 133*    Liver Enzymes Recent Labs  Lab 06/25/17 2037 06/29/17 0443  AST 31 16  ALT 13* 13*  ALKPHOS 72 66  BILITOT 0.7 0.3  ALBUMIN 3.2* 2.1*    Cardiac Enzymes Recent Labs  Lab 06/25/17 2037  TROPONINI <0.03    Glucose Recent Labs  Lab 06/29/17 1148 06/29/17 1600 06/29/17 1949 06/29/17 2304 06/30/17 0325 06/30/17 0752  GLUCAP 83 97 104* 105* 104* 97    Imaging No results found.    CULTURES: Urine obtained at [place] is growing greater than 10 to the fifth gram-negative rods  ANTIBIOTICS: Meropenem started on 2/17   DISCUSSION:      This is an 82 year old with a prior history of ESBL producing gram-negative UTI and a known right convexity meningioma who was transferred to this hospital after prolonged seizure.  She has had several episodes of clinical seizures with subsequent postictal states since admission.  She was on meropenem for an ESBL producing UTI but we discovered that meropenem greatly accelerates the tablets him of valproic acid so the meropenem has been substituted.    ASSESSMENT / PLAN:  PULMONARY A:   Her gas exchange and mechanics are  quite good and her mental status is somewhat better today I will be attempting a spontaneous breathing trial this morning.  She does have a Pseudomonas in her sputum but she has no evidence of pneumonia.  CARDIOVASCULAR A: Now appears to be in  atrial fibrillation.  I will be adding a beta-blocker if necessary for rate control. Her rate is controlled now, but she's on precedex. Echo shows mild LV systolic dysfunction, sequentially adding lasix and afterload reduction.   GASTROINTESTINAL A: Prophylaxis is with Protonix  INFECTIOUS A: Continue fosfomycin for ESBL producing E. coli in the urine  NEUROLOGIC A: Mental status is improved, suspect a resolving  Post ictal state. Adjustment of AED's per Neurology.     Lars Masson, MD Pulmonary and [place] Pager: [phone])  [phone] 06/30/2017, 8:40 AM

## 2017-06-30 NOTE — Progress Notes (Addendum)
[facility] for Infectious Disease  Date of Admission:  06/26/2017     Total days of antibiotics 5  Fosfomycin x 1 2/20  Meropenem 2/16 > 2/19         Patient ID: Melissa Cochran is a 82 y.o. female admitted with complicated seizures, protective ventilation need and ESBL Ecoli UTI.   Principal Problem:   Seizures ([place]) Active Problems:   E-coli UTI, ESBL    A-fib ([facility])   Need for protective airway ventilation   . chlorhexidine gluconate (MEDLINE KIT)  15 mL Mouth Rinse BID  . Chlorhexidine Gluconate Cloth  6 each Topical Q0600  . feeding supplement (PRO-STAT SUGAR FREE 64)  30 mL Per Tube BID  . furosemide  20 mg Intravenous Q12H  . heparin  5,000 Units Subcutaneous Q8H  . ipratropium-albuterol  3 mL Nebulization Q6H  . mouth rinse  15 mL Mouth Rinse 10 times per day  . pantoprazole (PROTONIX) IV  40 mg Intravenous Daily  . sodium chloride flush  10-40 mL Intracatheter Q12H    SUBJECTIVE: Received tylenol for temperature 100.4 yesterday afternoon ~1800. Afebrile since. Still on precedex and ventilator - attempting SBT per PCCM's direction. WBC normal. Remains in rate controlled atrial fib.   No Known Allergies  OBJECTIVE: Vitals:   06/30/17 0800 06/30/17 0850 06/30/17 0900 06/30/17 1000  BP: (!) 143/74 140/78 (!) 176/87 (!) 141/73  Pulse: 70 78 81 79  Resp:  13    Temp: 98.2 F (36.8 C)  98.2 F (36.8 C) 99 F (37.2 C)  TempSrc:      SpO2: 100% 100% 100% 100%  Weight:      Height:       Body mass index is 31.52 kg/m.  Physical Exam  Constitutional: She is oriented to person, place, and time.  Chronically ill appearing elderly woman on ventilator.   HENT:  Mouth/Throat: No oral lesions. No dental abscesses.  ETT in place. Otherwise oropharynx clear    Cardiovascular: Normal rate and normal heart sounds. An irregularly irregular rhythm present.  Pulmonary/Chest: Effort normal and breath sounds normal.  Abdominal: Soft. She exhibits no  distension. There is no tenderness.  Musculoskeletal: Normal range of motion. She exhibits edema (generalized ). She exhibits no tenderness.  Lymphadenopathy:    She has no cervical adenopathy.  Neurological: She is alert and oriented to person, place, and time.  Skin: Skin is warm and dry. No rash noted.  Psychiatric: Mood, affect and judgment normal.  Nursing note and vitals reviewed.  Lab Results Lab Results  Component Value Date   WBC 5.2 06/30/2017   HGB 8.9 (L) 06/30/2017   HCT 30.0 (L) 06/30/2017   MCV 86.7 06/30/2017   PLT 195 06/30/2017    Lab Results  Component Value Date   CREATININE 0.42 (L) 06/30/2017   BUN 21 (H) 06/30/2017   NA 143 06/30/2017   K 3.6 06/30/2017   CL 109 06/30/2017   CO2 26 06/30/2017    Lab Results  Component Value Date   ALT 13 (L) 06/29/2017   AST 16 06/29/2017   ALKPHOS 66 06/29/2017   BILITOT 0.3 06/29/2017     Microbiology: Urine Cx 2/16 >> ESBL Ecoli  CSF 2/16 >> no growth on culture, WBC normal, Glucose/Protein normal.  Sputum 2/16 >> pseudomonas (R-cipro) Blood Cx 2/16 >> NG x 4d 2/2   Assessment:  82 y.o. female admitted with complicated seizures intubated protectively now with difficulty  weaning from ventilator 2/2 neurologic status/agitation. Urine found to have ESBL ecoli and started on meropenem for suspicion of UTI. Unfortunately on day 4 of meropenem she started having seizures again. She was given 1 dose of fosfomycin yesterday.   Pseudomonas growing in respiratory aspirate - previously this patient required prolonged ventilation and trach as well as LTAC stay. She has normal procalcitonin, adequate oxygenation and normal respiratory effort. CXR also more c/w CHF.    Plan:  1. No change to current plan for UTI treatment - she has had adequate treatment for this.  2. Would hold antipyretics for temps < 101 to trend natural fever curve.   We will sign off for now but happy to have call back about this patient should her  condition change.   Janene Madeira, MSN, NP-C [facility] for Infectious [place] Cell: [phone] Pager: [phone]  06/30/2017  10:20 AM

## 2017-06-30 NOTE — Progress Notes (Signed)
Called pt's daughter [name] for updates on patient's progression but there was no answer. Will try again later. Erryn Dickison, Rande Brunt, RN

## 2017-06-30 NOTE — Progress Notes (Signed)
[facility] Progress Note Patient Name: Melissa Cochran DOB: Dec 17, 1935 MRN: [id number]   Date of Service  06/30/2017  HPI/Events of Note  SBP 174  eICU Interventions  Labetalol prn ordered and norvasc 10mg  daily started     Intervention Category Intermediate Interventions: Hypertension - evaluation and management  Sharia Reeve 06/30/2017, 9:57 PM

## 2017-06-30 NOTE — Progress Notes (Signed)
Patient has been extubated, cough present, on 4L O2She is only alert to self but very aware of her surroundings. She is requesting to talk with her daughter. No pain at this time. Will continue to monitor. Vasil Juhasz, Rande Brunt, RN

## 2017-06-30 NOTE — Progress Notes (Signed)
After turning and cleaning the patient her heart rate has been in the 130's sustaining. A one time dose of Lopressor was ordered as well as q6 scheduled by Dr. Pearline Cables. Shania Bjelland, Rande Brunt, RN

## 2017-06-30 NOTE — Progress Notes (Signed)
Pharmacy Consult - VPA  73 yof with recent hospitalization in October, discharged to [facility] with trach and PEG, admitted unresponsive and post-ictal. Patient with history of seizures on Keppra and VPA and was on meropenem day #4 inpatient for ESBL Ecoli UTI (CCM treating pseudomonas in resp cx as colonizer). VPA level has continued to trend down despite dose increase to 1g IV q8h on 02/22/17 by Neuro (had been on meropenem previously). Pharmacy consulted to monitor and dose VPA.  VPA and meropenem have a significant DDI resulting in decreased VPA levels and increased risk of seizures. Meropenem was discontinued 2/20 (last dose @ 0555) and patient was given fosfomycin x 1 dose. Today's VPA level remains low at 15 - meropenem likely still clearing. No further seizures noted.  Plan: Continue VPA 1g IV q8 Check level tomorrow morning   Nena Jordan, PharmD, BCPS 06/30/2017 9:07 AM

## 2017-06-30 NOTE — Progress Notes (Addendum)
Neurology Progress Note   S:// No seizures overnight.   O:// Current vital signs: BP (!) 143/74   Pulse 70   Temp 98.2 F (36.8 C)   Resp 15   Ht '5\' 4"'$  (1.626 m)   Wt 83.3 kg (183 lb 10.3 oz)   SpO2 100%   BMI 31.52 kg/m  Vital signs in last 24 hours: Temp:  [98.2 F (36.8 C)-100.8 F (38.2 C)] 98.2 F (36.8 C) (02/21 0800) Pulse Rate:  [62-127] 70 (02/21 0800) Resp:  [15-27] 15 (02/21 0317) BP: (85-215)/(50-103) 143/74 (02/21 0800) SpO2:  [98 %-100 %] 100 % (02/21 0800) FiO2 (%):  [40 %] 40 % (02/21 0317) Weight:  [83.3 kg (183 lb 10.3 oz)] 83.3 kg (183 lb 10.3 oz) (02/21 0330) Essentially unchanged exam from yesterday HEENT: Normocephalic atraumatic Cardiovascular: S1-S2 heard regular rate rhythm Lungs clear to auscultation Abdomen nondistended nontender Musculoskeletal: Warm well perfused Neurological exam Mental status: Patient's currently on Precedex.  Opens eyes to command.  Follows commands in the upper extremities.  Wiggles toes. Cranial nerves: Pupils equal round react to light, extraocular movements seem intact with no gaze preference.  Facial symmetry difficult to ascertain with the intubation. Motor exam: He is able to raise both arms antigravity.  Is able to wiggle toes on command. Sensory exam: Perceives light touch on all 4 extremities equally. Did not test coordination or gait at this time Dampened deep tendon reflexes all over.  Medications  Current Facility-Administered Medications:  .  0.9 %  sodium chloride infusion, 250 mL, Intravenous, PRN, Omar Person, NP, Last Rate: 10 mL/hr at 06/30/17 0800, 250 mL at 06/30/17 0800 .  acetaminophen (TYLENOL) solution 650 mg, 650 mg, Oral, Q6H PRN, Omar Person, NP, 650 mg at 06/29/17 1713 .  albumin human 25 % solution 12.5 g, 12.5 g, Intravenous, Q6H, Sampson Goon, MD, 12.5 g at 06/30/17 0430 .  chlorhexidine gluconate (MEDLINE KIT) (PERIDEX) 0.12 % solution 15 mL, 15 mL, Mouth Rinse, BID,  Amie Portland, MD, 15 mL at 06/30/17 0748 .  Chlorhexidine Gluconate Cloth 2 % PADS 6 each, 6 each, Topical, Q0600, Sampson Goon, MD, 6 each at 06/29/17 2030 .  dexmedetomidine (PRECEDEX) 200 MCG/50ML (4 mcg/mL) infusion, 0.4-1.2 mcg/kg/hr, Intravenous, Titrated, Sampson Goon, MD, Last Rate: 11.7 mL/hr at 06/30/17 0800, 0.602 mcg/kg/hr at 06/30/17 0800 .  dextrose 5% in lactated ringers with KCl 20 mEq/L infusion, , Intravenous, Continuous, Sampson Goon, MD, Last Rate: 30 mL/hr at 06/30/17 0800 .  feeding supplement (PRO-STAT SUGAR FREE 64) liquid 30 mL, 30 mL, Per Tube, BID, Sampson Goon, MD, 30 mL at 06/29/17 2144 .  feeding supplement (VITAL AF 1.2 CAL) liquid 1,000 mL, 1,000 mL, Per Tube, Continuous, Sampson Goon, MD, Last Rate: 45 mL/hr at 06/30/17 0800, 1,000 mL at 06/30/17 0800 .  fentaNYL (SUBLIMAZE) injection 100 mcg, 100 mcg, Intravenous, Q15 min PRN, Omar Person, NP, 100 mcg at 06/29/17 1016 .  fentaNYL (SUBLIMAZE) injection 100 mcg, 100 mcg, Intravenous, Q2H PRN, Omar Person, NP, 100 mcg at 06/29/17 2157 .  furosemide (LASIX) injection 20 mg, 20 mg, Intravenous, Q12H, Sampson Goon, MD, 20 mg at 06/29/17 2144 .  heparin injection 5,000 Units, 5,000 Units, Subcutaneous, Q8H, Omar Person, NP, 5,000 Units at 06/30/17 0602 .  hydrALAZINE (APRESOLINE) injection 5 mg, 5 mg, Intravenous, Q4H PRN, Sampson Goon, MD, 5 mg at 06/29/17 1007 .  levETIRAcetam (KEPPRA) IVPB 1000 mg/100 mL premix, 1,000  mg, Intravenous, Q12H, [place], Lanice Schwab, MD, Stopped at 06/30/17 0445 .  LORazepam (ATIVAN) injection 2 mg, 2 mg, Intravenous, PRN, Sampson Goon, MD, 2 mg at 06/29/17 1025 .  MEDLINE mouth rinse, 15 mL, Mouth Rinse, 10 times per day, Amie Portland, MD, 15 mL at 06/30/17 0603 .  midazolam (VERSED) 50 mg in sodium chloride 0.9 % 50 mL (1 mg/mL) infusion, 0.5-8 mg/hr, Intravenous, Titrated, Oletta Darter Virgina Evener, MD, Stopped at 06/27/17 [phone] .  pantoprazole (PROTONIX)  injection 40 mg, 40 mg, Intravenous, Daily, Hayden Pedro M, NP, 40 mg at 06/29/17 0910 .  phenylephrine (NEO-SYNEPHRINE) 10 mg in sodium chloride 0.9 % 250 mL (0.04 mg/mL) infusion, 0-200 mcg/min, Intravenous, Titrated, Omar Person, NP, Stopped at 06/26/17 [phone] .  sodium chloride flush (NS) 0.9 % injection 10-40 mL, 10-40 mL, Intracatheter, Q12H, Sampson Goon, MD, 30 mL at 06/29/17 2145 .  sodium chloride flush (NS) 0.9 % injection 10-40 mL, 10-40 mL, Intracatheter, PRN, Sampson Goon, MD .  valproate (DEPACON) 1,000 mg in dextrose 5 % 50 mL IVPB, 1,000 mg, Intravenous, Q8H, Omar Person, NP, Stopped at 06/30/17 3614 Labs CBC    Component Value Date/Time   WBC 5.2 06/30/2017 0338   RBC 3.46 (L) 06/30/2017 0338   HGB 8.9 (L) 06/30/2017 0338   HGB 13.1 01/23/2014 1125   HCT 30.0 (L) 06/30/2017 0338   HCT 41.3 01/23/2014 1125   PLT 195 06/30/2017 0338   PLT 244 01/23/2014 1125   MCV 86.7 06/30/2017 0338   MCV 91 01/23/2014 1125   MCH 25.7 (L) 06/30/2017 0338   MCHC 29.7 (L) 06/30/2017 0338   RDW 13.9 06/30/2017 0338   RDW 13.3 01/23/2014 1125   LYMPHSABS 2.1 06/30/2017 0338   LYMPHSABS 1.1 01/23/2014 1125   MONOABS 0.6 06/30/2017 0338   MONOABS 0.5 01/23/2014 1125   EOSABS 0.4 06/30/2017 0338   EOSABS 0.1 01/23/2014 1125   BASOSABS 0.0 06/30/2017 0338   BASOSABS 0.0 01/23/2014 1125    CMP     Component Value Date/Time   NA 143 06/30/2017 0338   NA 141 08/26/2015 1131   NA 131 (L) 01/23/2014 1125   K 3.6 06/30/2017 0338   K 3.9 01/23/2014 1125   CL 109 06/30/2017 0338   CL 94 (L) 01/23/2014 1125   CO2 26 06/30/2017 0338   CO2 32 01/23/2014 1125   GLUCOSE 95 06/30/2017 0338   GLUCOSE 91 01/23/2014 1125   BUN 21 (H) 06/30/2017 0338   BUN 16 08/26/2015 1131   BUN 11 01/23/2014 1125   CREATININE 0.42 (L) 06/30/2017 0338   CREATININE 0.72 01/23/2014 1125   CALCIUM 9.1 06/30/2017 0338   CALCIUM 9.4 01/23/2014 1125   PROT 4.6 (L) 06/29/2017 0443    PROT 7.0 01/23/2014 1125   ALBUMIN 2.1 (L) 06/29/2017 0443   ALBUMIN 3.4 01/23/2014 1125   AST 16 06/29/2017 0443   AST 11 (L) 01/23/2014 1125   ALT 13 (L) 06/29/2017 0443   ALT 16 01/23/2014 1125   ALKPHOS 66 06/29/2017 0443   ALKPHOS 123 (H) 01/23/2014 1125   BILITOT 0.3 06/29/2017 0443   BILITOT 0.4 01/23/2014 1125   GFRNONAA >60 06/30/2017 0338   GFRNONAA >60 01/23/2014 1125   GFRAA >60 06/30/2017 0338   GFRAA >60 01/23/2014 1125   Ammonia level 30 VPA level yesterday-17  Assessment:  Most likely breakthrough seizure in the setting of subtherapeutic antiepileptic medication of urosepsis. Her Depakote levels have been subtherapeutic even with high  doses of Depakote.  She was on meropenem for treatment of UTI, which was probably the cause for excessive metabolization of the Depakote. Per pharmacy and ID consult, meropenem has been discontinued.  Recommendations: Continue Keppra at current dose Continue Depakote at current dose. Appreciate pharmacy help with Depakote dosing and monitoring. Goal level of Depakote 50-100. Supportive care and vent management per [facility] M as you are. Treatment of toxic metabolic derangements per [facility] M as you are.  I have advised the patient's RN to contact us if the patient has any seizures.  At that point, I would consider repeating an EEG and also may be adding a third antiepileptic agent.  For now I would keep her on Keppra and Depakote and wait for the Depakote level to be therapeutic, now that the meropenem has been discontinued.  -- Amie Portland, MD [facility] Pager: [phone] If 7pm to 7am, please call on call as listed on AMION.

## 2017-07-01 LAB — BASIC METABOLIC PANEL
ANION GAP: 12 (ref 5–15)
BUN: 15 mg/dL (ref 6–20)
CO2: 27 mmol/L (ref 22–32)
Calcium: 9.8 mg/dL (ref 8.9–10.3)
Chloride: 105 mmol/L (ref 101–111)
Creatinine, Ser: 1.18 mg/dL — ABNORMAL HIGH (ref 0.44–1.00)
GFR, EST AFRICAN AMERICAN: 49 mL/min — AB (ref >60)
GFR, EST NON AFRICAN AMERICAN: 42 mL/min — AB (ref >60)
Glucose, Bld: 104 mg/dL — ABNORMAL HIGH (ref 65–99)
POTASSIUM: 3.7 mmol/L (ref 3.5–5.1)
SODIUM: 144 mmol/L (ref 135–145)

## 2017-07-01 LAB — VALPROIC ACID LEVEL: VALPROIC ACID LVL: 23 ug/mL — AB (ref 50.0–100.0)

## 2017-07-01 LAB — GLUCOSE, CAPILLARY
GLUCOSE-CAPILLARY: 110 mg/dL — AB (ref 65–99)
GLUCOSE-CAPILLARY: 110 mg/dL — AB (ref 65–99)
GLUCOSE-CAPILLARY: 125 mg/dL — AB (ref 65–99)
Glucose-Capillary: 107 mg/dL — ABNORMAL HIGH (ref 65–99)
Glucose-Capillary: 112 mg/dL — ABNORMAL HIGH (ref 65–99)
Glucose-Capillary: 92 mg/dL (ref 65–99)

## 2017-07-01 MED ORDER — VALPROATE SODIUM 500 MG/5ML IV SOLN
500.0000 mg | Freq: Once | INTRAVENOUS | Status: AC
  Administered 2017-07-01: 500 mg via INTRAVENOUS
  Filled 2017-07-01: qty 5

## 2017-07-01 MED ORDER — ACETAMINOPHEN 650 MG RE SUPP
650.0000 mg | RECTAL | Status: DC | PRN
  Administered 2017-07-01: 650 mg via RECTAL
  Filled 2017-07-01: qty 1

## 2017-07-01 NOTE — Progress Notes (Signed)
PULMONARY / CRITICAL CARE MEDICINE   Name: Melissa Cochran MRN: [id number] DOB: [birth year]    ADMISSION DATE:  06/26/2017   CHIEF COMPLAINT:  Unresponsive, post ictal  HISTORY OF PRESENT ILLNESS:      82 year old female with PMH of HTN, CHF, COPD, HLD, CAD, Seizures, Meningioma   Recent Hospitalization 10/7-10/16. Found unresponsive, intubated, concerning for seizures. 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 Blood cultures positive for staph, however believed to be contaminant. UTI due to ESBL E.Coli. EEG with sharp activity over the right hemisphere which suggests focal disturbance. Ultimately Trach and PEG was placed as mental status did not improve. Discharged to [facility]. Decannulation shortly after due to improvement. Went to [facility], since has been weak and wheelchair bound, able to feed self, alert and oriented.   Presented to [facility] ED on 2/16 with reported seizure activity for 45 minutes. Given 4 mg versed per EMS. Upon arrival to ED given 2 mg of Ativan without improvement. Intubated for airway protection and transferred to [facility].  He was extubated on 2/21 and so far this is been well-tolerated.  She is verbally interactive with me this morning with appropriate responses.  She has shown no further seizure activity despite a valproic acid level which remains subtherapeutic.   PAST MEDICAL HISTORY :  She  has a past medical history of Adnexal mass, Anxiety, Arthritis, Bladder tumor, Bleeding ulcer, CHF (congestive heart failure) ([place]), CHF (congestive heart failure) ([place]), COPD (chronic obstructive pulmonary disease) ([place]), Coronary artery disease, GERD (gastroesophageal reflux disease), Heart block, History of bleeding ulcers, Hyperlipemia, Hypertension, Pneumonia, and Shortness of breath dyspnea.  PAST SURGICAL HISTORY: She  has a past surgical history that includes Cardiac surgery; Abdominal hysterectomy; Stomach surgery; Hip surgery; Cardiac catheterization (Left,  10/21/2015); Cardiac catheterization (N/A, 10/21/2015); Transurethral resection of bladder tumor (N/A, 01/20/2016); Cystoscopy w/ retrogrades (Bilateral, 01/20/2016); Cystoscopy with stent placement (Right, 01/20/2016); and Tracheostomy tube placement (N/A, 03/02/2017).  No Known Allergies  No current facility-administered medications on file prior to encounter.    Current Outpatient Medications on File Prior to Encounter  Medication Sig  . acetaminophen (TYLENOL) 325 MG tablet Take 2 tablets (650 mg total) by mouth every 6 (six) hours as needed for mild pain (or Fever >/= 101).  . Amino Acids-Protein Hydrolys (FEEDING SUPPLEMENT, PRO-STAT SUGAR FREE 64,) LIQD Place 30 mLs into feeding tube 2 (two) times daily. (Patient taking differently: Take 30 mLs by mouth 3 (three) times daily. )  . amLODipine (NORVASC) 10 MG tablet Take 10 mg by mouth daily.  Marland Kitchen aspirin EC 81 MG tablet Take 81 mg by mouth daily.  Marland Kitchen atorvastatin (LIPITOR) 10 MG tablet Take 10 mg by mouth at bedtime.  . carvedilol (COREG) 6.25 MG tablet Take 6.25 mg by mouth 2 (two) times daily.  Marland Kitchen levETIRAcetam (KEPPRA) 750 MG tablet Take 750 mg by mouth 2 (two) times daily.  . Melatonin 3 MG TABS Take 3 mg by mouth at bedtime.  . Multiple Vitamin (THEREMS) TABS Take 1 tablet by mouth daily.  . sertraline (ZOLOFT) 25 MG tablet Take 25 mg by mouth daily.  . tamsulosin (FLOMAX) 0.4 MG CAPS capsule Take 0.4 mg by mouth daily.  . Valproate Sodium (DEPAKENE) 250 MG/5ML SOLN solution Take 500 mg by mouth 2 (two) times daily.  Marland Kitchen acetaminophen (TYLENOL) 650 MG suppository Place 1 suppository (650 mg total) rectally every 6 (six) hours as needed for mild pain (or Fever >/= 101). (Patient not taking: Reported on 06/26/2017)  . acyclovir  650 mg in dextrose 5 % 100 mL Inject 650 mg into the vein daily. (Patient not taking: Reported on 06/26/2017)  . albuterol (PROVENTIL) (2.5 MG/3ML) 0.083% nebulizer solution Take 3 mLs (2.5 mg total) by nebulization every 2  (two) hours as needed for wheezing. (Patient not taking: Reported on 06/26/2017)  . chlorhexidine gluconate, MEDLINE KIT, (PERIDEX) 0.12 % solution 15 mLs by Mouth Rinse route 2 (two) times daily. (Patient not taking: Reported on 06/26/2017)  . hydrALAZINE (APRESOLINE) 20 MG/ML injection Inject 0.5 mLs (10 mg total) into the vein every 6 (six) hours as needed (SBP>180 or DBP>100). (Patient not taking: Reported on 06/26/2017)  . levETIRAcetam 500 mg in sodium chloride 0.9 % 100 mL Inject 500 mg into the vein every 12 (twelve) hours. (Patient not taking: Reported on 06/26/2017)  . meropenem 1 g in sodium chloride 0.9 % 100 mL Inject 1 g into the vein every 12 (twelve) hours. (Patient not taking: Reported on 06/26/2017)  . metoprolol tartrate (LOPRESSOR) 5 MG/5ML SOLN injection Inject 2.5-5 mLs (2.5-5 mg total) into the vein every 6 (six) hours as needed (for heart rate >120). (Patient not taking: Reported on 06/26/2017)  . mouth rinse LIQD solution 15 mLs by Mouth Rinse route as needed. (Patient not taking: Reported on 06/26/2017)  . Multiple Vitamin (MULTIVITAMIN) LIQD Place 15 mLs into feeding tube daily. (Patient not taking: Reported on 06/26/2017)  . Nutritional Supplements (FEEDING SUPPLEMENT, VITAL 1.5 CAL,) LIQD Place 1,000 mLs into feeding tube daily. (Patient not taking: Reported on 06/26/2017)  . ondansetron (ZOFRAN) 4 MG/2ML SOLN injection Inject 2 mLs (4 mg total) into the vein every 6 (six) hours as needed for nausea. (Patient not taking: Reported on 06/26/2017)  . pantoprazole sodium (PROTONIX) 40 mg/20 mL PACK Place 20 mLs (40 mg total) into feeding tube daily at 12 noon. (Patient not taking: Reported on 06/26/2017)  . senna-docusate (SENOKOT-S) 8.6-50 MG tablet Place 1 tablet into feeding tube 2 (two) times daily. (Patient not taking: Reported on 06/26/2017)  . valproate 1,000 mg in dextrose 5 % 50 mL Inject 1,000 mg into the vein every 8 (eight) hours. (Patient not taking: Reported on 06/26/2017)  .  Water For Irrigation, Sterile (FREE WATER) SOLN Place 100 mLs into feeding tube every 8 (eight) hours. (Patient not taking: Reported on 06/26/2017)    FAMILY HISTORY:  Her indicated that her mother is deceased. She indicated that her father is deceased. She indicated that the status of her neg hx is unknown.   SOCIAL HISTORY: She  reports that she quit smoking about 19 years ago. She has a 40.00 pack-year smoking history. she has never used smokeless tobacco. She reports that she does not drink alcohol or use drugs.  REVIEW OF SYSTEMS:   Not obtainable  SUBJECTIVE:  As above  VITAL SIGNS: BP (!) 157/86   Pulse 97   Temp 99.3 F (37.4 C)   Resp (!) 28   Ht '5\' 4"'$  (1.626 m)   Wt 173 lb 15.1 oz (78.9 kg)   SpO2 98%   BMI 29.86 kg/m   HEMODYNAMICS:    VENTILATOR SETTINGS: Vent Mode: CPAP;PSV FiO2 (%):  [40 %] 40 % PEEP:  [5 cmH20] 5 cmH20 Pressure Support:  [8 cmH20] 8 cmH20  INTAKE / OUTPUT: I/O last 3 completed shifts: In: 3197.5 [I.V.:1735; NG/GT:712.5; IV Piggyback:750] Out: 4236 [Urine:4235; Stool:1]  PHYSICAL EXAMINATION: General: Chronically ill-appearing elderly female, who is extubated and in no distress.  Neuro: She is remarkably oriented x3 and  appropriately interactive.  Pupils are equal and she moves all fours on request.  She appears to have an intention tremor.    Cardiovascular: S1 and S2 are irregularly irregular without murmur rub or gallop  Lungs: Respirations are unlabored,  there is symmetric air movement, few scattered rhonchi, no wheezes.  scattered wheezes.   Abdomen: The abdomen is obese, soft without any organomegaly masses tenderness 1+ anasarca LABS:  BMET Recent Labs  Lab 06/29/17 0443 06/30/17 0338 07/01/17 0300  NA 144 143 144  K 3.3* 3.6 3.7  CL 111 109 105  CO2 '24 26 27  '$ BUN 21* 21* 15  CREATININE 0.46 0.42* 1.18*  GLUCOSE 111* 95 104*    Electrolytes Recent Labs  Lab 06/29/17 0443 06/29/17 1707 06/30/17 0338  06/30/17 1616 07/01/17 0300  CALCIUM 8.8*  --  9.1  --  9.8  MG 1.8 1.9 1.9 1.9  --   PHOS 2.5 2.3* 2.5 2.4*  --     CBC Recent Labs  Lab 06/26/17 0319 06/29/17 0443 06/30/17 0338  WBC 7.0 5.3 5.2  HGB 9.1* 9.0* 8.9*  HCT 30.8* 30.4* 30.0*  PLT 259 193 195    Coag's Recent Labs  Lab 06/25/17 2104  INR 1.18    Sepsis Markers Recent Labs  Lab 06/25/17 2037 06/26/17 0319 06/27/17 0232  LATICACIDVEN 2.2* 2.5*  --   PROCALCITON <0.10  --  <0.10    ABG Recent Labs  Lab 06/26/17 0210 06/26/17 0416 06/29/17 0345  PHART 7.303* 7.390 7.443  PCO2ART 50.4* 41.9 37.0  PO2ART 71.0* 88.2 133*    Liver Enzymes Recent Labs  Lab 06/25/17 2037 06/29/17 0443  AST 31 16  ALT 13* 13*  ALKPHOS 72 66  BILITOT 0.7 0.3  ALBUMIN 3.2* 2.1*    Cardiac Enzymes Recent Labs  Lab 06/25/17 2037  TROPONINI <0.03    Glucose Recent Labs  Lab 06/30/17 1611 06/30/17 1650 06/30/17 1958 06/30/17 2341 07/01/17 0330 07/01/17 0746  GLUCAP 68 89 81 96 110* 92    Imaging No results found.    CULTURES: Urine obtained at [place] is growing greater than 10 to the fifth gram-negative rods  ANTIBIOTICS: Meropenem started on 2/17   DISCUSSION:      This is an 82 year old with a prior history of ESBL producing gram-negative UTI and a known right convexity meningioma who was transferred to this hospital after prolonged seizure.  She has had several episodes of clinical seizures with subsequent postictal states since admission.  She was on meropenem for an ESBL producing UTI but we discovered that meropenem greatly accelerates the metabolism of valproic acid so the meropenem has been substituted.    ASSESSMENT / PLAN:  PULMONARY A:   Intubation has been well-tolerated.  She has no indication of aspiration nor does she have any difficulty in protecting her airway.    CARDIOVASCULAR A: Now appears to be in atrial fibrillation.  I added a small dose of Lopressor for rate  control.   controlled now, but she's on precedex. Echo shows mild LV systolic dysfunction, I am diuresing the patient and will add a modest dose of ACE inhibitor if renal function allows after diuresis.     GASTROINTESTINAL A: Prophylaxis is with Protonix  INFECTIOUS A: She received 1 dose of  fosfomycin for ESBL producing E. coli in the urine  NEUROLOGIC A: Mental status is improved, suspect a resolving  Post ictal state. Adjustment of AED's per Neurology.     Lars Masson, MD  Pulmonary and [place] Pager: [phone] 07/01/2017, 10:55 AM

## 2017-07-01 NOTE — Progress Notes (Signed)
Pharmacy Consult - VPA  63 yof with recent hospitalization in October, discharged to [facility] with trach and PEG, admitted unresponsive and post-ictal. Patient with history of seizures on Keppra and VPA and was on meropenem day #4 inpatient for ESBL Ecoli UTI (CCM treating pseudomonas in resp cx as colonizer). VPA level has continued to trend down despite dose increase to 1g IV q8h on 02/22/17 by Neuro (had been on meropenem previously). Pharmacy consulted to monitor and dose VPA.  VPA and meropenem have a significant DDI resulting in decreased VPA levels and increased risk of seizures. Meropenem was discontinued 2/20 (last dose @ 0555) and patient was given fosfomycin x 1 dose. Today's VPA level has started to increase to 23. Discussed with Dr. Rory Percy and will give a loading dose 500mg  x 1 then continue 1g q8 - likely to be therapeutic this weekend.   Plan: VPA 500mg  IV x 1 Continue VPA 1g IV q8 Check level tomorrow morning   Nena Jordan, PharmD, BCPS 07/01/2017 10:32 AM

## 2017-07-01 NOTE — Progress Notes (Signed)
Neurology Progress Note   S:// Patient seen and examined Extubated   O:// Current vital signs: BP (!) 168/83   Pulse 88   Temp 99.9 F (37.7 C)   Resp (!) 27   Ht '5\' 4"'$  (1.626 m)   Wt 78.9 kg (173 lb 15.1 oz)   SpO2 98%   BMI 29.86 kg/m  Vital signs in last 24 hours: Temp:  [99 F (37.2 C)-102 F (38.9 C)] 99.9 F (37.7 C) (02/22 0700) Pulse Rate:  [40-140] 88 (02/22 0700) Resp:  [18-44] 27 (02/22 0700) BP: (108-191)/(51-111) 168/83 (02/22 0821) SpO2:  [93 %-100 %] 98 % (02/22 0849) FiO2 (%):  [40 %] 40 % (02/22 0134) Weight:  [78.9 kg (173 lb 15.1 oz)] 78.9 kg (173 lb 15.1 oz) (02/22 0500) Awake alert oriented in no apparent distress S1-S2 heard regular rate rhythm Chest clear to auscultation Abdomen nondistended nontender Neurological exam next line patient awake alert oriented x3 Her speech is mildly dysarthric Naming comprehension and repetition are intact Cranial nerves: Pupils equal round reactive light, extra ocular movements intact, visual fields full, facial symmetry is maintained, facial sensation normal bilaterally, palate elevates midline, tongue midline. Motor exam: Able to lift both upper extremity is antigravity.  Wiggles toes to commands but not able to lift left leg antigravity.  Able to lift right leg for a very short duration of time antigravity Sensory exam: Appreciated light touch all over without any focal deficits Coronation: Not performed Gait exam: Deferred due to patient's medical condition   Medications  Current Facility-Administered Medications:  .  0.9 %  sodium chloride infusion, 250 mL, Intravenous, PRN, Omar Person, NP, Last Rate: 10 mL/hr at 07/01/17 0600, 250 mL at 07/01/17 0600 .  acetaminophen (TYLENOL) solution 650 mg, 650 mg, Oral, Q6H PRN, Omar Person, NP, 650 mg at 06/29/17 1713 .  acetaminophen (TYLENOL) suppository 650 mg, 650 mg, Rectal, Q4H PRN, Sharia Reeve, MD, 650 mg at 07/01/17 0052 .  amLODipine  (NORVASC) tablet 10 mg, 10 mg, Oral, Daily, Panchal, Amar, MD .  chlorhexidine gluconate (MEDLINE KIT) (PERIDEX) 0.12 % solution 15 mL, 15 mL, Mouth Rinse, BID, Amie Portland, MD, 15 mL at 07/01/17 0825 .  Chlorhexidine Gluconate Cloth 2 % PADS 6 each, 6 each, Topical, Q0600, Sampson Goon, MD, 6 each at 07/01/17 0430 .  dexmedetomidine (PRECEDEX) 200 MCG/50ML (4 mcg/mL) infusion, 0.4-1.2 mcg/kg/hr, Intravenous, Titrated, Sampson Goon, MD, Stopped at 06/30/17 1407 .  dextrose 5% in lactated ringers with KCl 20 mEq/L infusion, , Intravenous, Continuous, Sampson Goon, MD, Last Rate: 30 mL/hr at 07/01/17 0600 .  feeding supplement (PRO-STAT SUGAR FREE 64) liquid 30 mL, 30 mL, Per Tube, BID, Sampson Goon, MD, 30 mL at 06/30/17 [phone] .  feeding supplement (VITAL AF 1.2 CAL) liquid 1,000 mL, 1,000 mL, Per Tube, Continuous, Sampson Goon, MD, Stopped at 06/30/17 [phone] .  fentaNYL (SUBLIMAZE) injection 100 mcg, 100 mcg, Intravenous, Q15 min PRN, Omar Person, NP, 100 mcg at 06/29/17 1016 .  fentaNYL (SUBLIMAZE) injection 100 mcg, 100 mcg, Intravenous, Q2H PRN, Omar Person, NP, 100 mcg at 07/01/17 0414 .  furosemide (LASIX) injection 20 mg, 20 mg, Intravenous, Q12H, Sampson Goon, MD, 20 mg at 07/01/17 9323 .  heparin injection 5,000 Units, 5,000 Units, Subcutaneous, Q8H, Omar Person, NP, 5,000 Units at 07/01/17 0553 .  hydrALAZINE (APRESOLINE) injection 5 mg, 5 mg, Intravenous, Q4H PRN, Sampson Goon, MD, 5 mg at 07/01/17 5573 .  ipratropium (ATROVENT) nebulizer solution 0.5 mg, 0.5 mg, Nebulization, Q6H, Panchal, Amar, MD, 0.5 mg at 07/01/17 0849 .  labetalol (NORMODYNE,TRANDATE) injection 10-20 mg, 10-20 mg, Intravenous, Q2H PRN, Sharia Reeve, MD, 20 mg at 07/01/17 0337 .  levETIRAcetam (KEPPRA) 1,000 mg in sodium chloride 0.9 % 100 mL IVPB, 1,000 mg, Intravenous, Q12H, [place], Rise Paganini, MD, Stopped at 07/01/17 0343 .  LORazepam (ATIVAN) injection 2 mg, 2 mg, Intravenous,  PRN, Sampson Goon, MD, 2 mg at 06/29/17 1025 .  MEDLINE mouth rinse, 15 mL, Mouth Rinse, BID, Sampson Goon, MD .  metoprolol tartrate (LOPRESSOR) injection 2.5 mg, 2.5 mg, Intravenous, Q6H, Sampson Goon, MD, 2.5 mg at 07/01/17 0553 .  midazolam (VERSED) 50 mg in sodium chloride 0.9 % 50 mL (1 mg/mL) infusion, 0.5-8 mg/hr, Intravenous, Titrated, Oletta Darter Virgina Evener, MD, Stopped at 06/27/17 [phone] .  pantoprazole (PROTONIX) injection 40 mg, 40 mg, Intravenous, Daily, Hayden Pedro M, NP, 40 mg at 06/30/17 0942 .  phenylephrine (NEO-SYNEPHRINE) 10 mg in sodium chloride 0.9 % 250 mL (0.04 mg/mL) infusion, 0-200 mcg/min, Intravenous, Titrated, Omar Person, NP, Stopped at 06/26/17 [phone] .  sodium chloride flush (NS) 0.9 % injection 10-40 mL, 10-40 mL, Intracatheter, Q12H, Sampson Goon, MD, 30 mL at 06/30/17 2129 .  sodium chloride flush (NS) 0.9 % injection 10-40 mL, 10-40 mL, Intracatheter, PRN, Sampson Goon, MD .  valproate (DEPACON) 1,000 mg in dextrose 5 % 50 mL IVPB, 1,000 mg, Intravenous, Q8H, Omar Person, NP, Stopped at 07/01/17 2952 Labs CBC    Component Value Date/Time   WBC 5.2 06/30/2017 0338   RBC 3.46 (L) 06/30/2017 0338   HGB 8.9 (L) 06/30/2017 0338   HGB 13.1 01/23/2014 1125   HCT 30.0 (L) 06/30/2017 0338   HCT 41.3 01/23/2014 1125   PLT 195 06/30/2017 0338   PLT 244 01/23/2014 1125   MCV 86.7 06/30/2017 0338   MCV 91 01/23/2014 1125   MCH 25.7 (L) 06/30/2017 0338   MCHC 29.7 (L) 06/30/2017 0338   RDW 13.9 06/30/2017 0338   RDW 13.3 01/23/2014 1125   LYMPHSABS 2.1 06/30/2017 0338   LYMPHSABS 1.1 01/23/2014 1125   MONOABS 0.6 06/30/2017 0338   MONOABS 0.5 01/23/2014 1125   EOSABS 0.4 06/30/2017 0338   EOSABS 0.1 01/23/2014 1125   BASOSABS 0.0 06/30/2017 0338   BASOSABS 0.0 01/23/2014 1125    CMP     Component Value Date/Time   NA 144 07/01/2017 0300   NA 141 08/26/2015 1131   NA 131 (L) 01/23/2014 1125   K 3.7 07/01/2017 0300   K 3.9  01/23/2014 1125   CL 105 07/01/2017 0300   CL 94 (L) 01/23/2014 1125   CO2 27 07/01/2017 0300   CO2 32 01/23/2014 1125   GLUCOSE 104 (H) 07/01/2017 0300   GLUCOSE 91 01/23/2014 1125   BUN 15 07/01/2017 0300   BUN 16 08/26/2015 1131   BUN 11 01/23/2014 1125   CREATININE 1.18 (H) 07/01/2017 0300   CREATININE 0.72 01/23/2014 1125   CALCIUM 9.8 07/01/2017 0300   CALCIUM 9.4 01/23/2014 1125   PROT 4.6 (L) 06/29/2017 0443   PROT 7.0 01/23/2014 1125   ALBUMIN 2.1 (L) 06/29/2017 0443   ALBUMIN 3.4 01/23/2014 1125   AST 16 06/29/2017 0443   AST 11 (L) 01/23/2014 1125   ALT 13 (L) 06/29/2017 0443   ALT 16 01/23/2014 1125   ALKPHOS 66 06/29/2017 0443   ALKPHOS 123 (H) 01/23/2014 1125  BILITOT 0.3 06/29/2017 0443   BILITOT 0.4 01/23/2014 1125   GFRNONAA 42 (L) 07/01/2017 0300   GFRNONAA >60 01/23/2014 1125   GFRAA 49 (L) 07/01/2017 0300   GFRAA >60 01/23/2014 1125    Assessment:  Breakthrough seizures in the setting of subtherapeutic antiepileptic medication and urosepsis Depakote level still subtherapeutic-likely due to interaction with meropenem.  Recommendations: Continue Keppra at current dose Continue Depakote at current dose Additional Keppra 500 IV x1 Check Depakote level tomorrow in the morning Medical management of toxic metabolic derangements per primary team as you are. I would prefer to keep the patient on Keppra and Depakote.  Hopefully the meropenem should be out of her system in another day or 2 and she would become therapeutic on the Depakote. My suspicion is that the seizures were probably breakthrough in the setting of subtherapeutic Depakote levels-as well as lowering of seizure threshold with infection and interaction with meropenem. Appreciate pharmacy assistance with management of Depakote Plan relayed to the primary team attending in person on the floor  -- Amie Portland, MD [facility] Pager: [phone] If 7pm to 7am, please call on call as  listed on AMION.  CRITICAL CARE ATTESTATION This patient is critically ill and at significant risk of neurological worsening, death and care requires constant monitoring of vital signs, hemodynamics,respiratory and cardiac monitoring. I spent 35  minutes of neurocritical care time performing neurological assessment, discussion with family, other specialists and medical decision making of high complexityin the care of  this patient.

## 2017-07-01 NOTE — Evaluation (Signed)
Clinical/Bedside Swallow Evaluation Patient Details  Name: Melissa Cochran MRN: [id number] Date of Birth: [birth year]  Today's Date: 07/01/2017 Time: SLP Start Time (ACUTE ONLY): 1350 SLP Stop Time (ACUTE ONLY): 1410 SLP Time Calculation (min) (ACUTE ONLY): 20 min  Past Medical History:  Past Medical History:  Diagnosis Date  . Adnexal mass   . Anxiety   . Arthritis   . Bladder tumor   . Bleeding ulcer   . CHF (congestive heart failure) ([place])   . CHF (congestive heart failure) ([place])   . COPD (chronic obstructive pulmonary disease) ([place])   . Coronary artery disease   . GERD (gastroesophageal reflux disease)   . Heart block    left  . History of bleeding ulcers   . Hyperlipemia   . Hypertension   . Pneumonia   . Shortness of breath dyspnea    Past Surgical History:  Past Surgical History:  Procedure Laterality Date  . ABDOMINAL HYSTERECTOMY    . CARDIAC CATHETERIZATION Left 10/21/2015   Procedure: Left Heart Cath and Coronary Angiography;  Surgeon: Isaias Cowman, MD;  Location: [place] [facility];  Service: Cardiovascular;  Laterality: Left;  . CARDIAC CATHETERIZATION N/A 10/21/2015   Procedure: Coronary Stent Intervention;  Surgeon: Isaias Cowman, MD;  Location: [place] [facility];  Service: Cardiovascular;  Laterality: N/A;  . CARDIAC SURGERY     cardiac cath  . CYSTOSCOPY W/ RETROGRADES Bilateral 01/20/2016   Procedure: CYSTOSCOPY WITH RETROGRADE PYELOGRAM;  Surgeon: Hollice Espy, MD;  Location: [facility];  Service: Urology;  Laterality: Bilateral;  . CYSTOSCOPY WITH STENT PLACEMENT Right 01/20/2016   Procedure: CYSTOSCOPY WITH STENT PLACEMENT;  Surgeon: Hollice Espy, MD;  Location: [facility];  Service: Urology;  Laterality: Right;  . HIP SURGERY     left  . STOMACH SURGERY    . TRACHEOSTOMY TUBE PLACEMENT N/A 03/02/2017   Procedure: TRACHEOSTOMY;  Surgeon: Rozetta Nunnery, MD;  Location: [facility];  Service: ENT;  Laterality: N/A;  . TRANSURETHRAL  RESECTION OF BLADDER TUMOR N/A 01/20/2016   Procedure: TRANSURETHRAL RESECTION OF BLADDER TUMOR (TURBT);  Surgeon: Hollice Espy, MD;  Location: [facility];  Service: Urology;  Laterality: N/A;   HPI:  82 year old female with PMH of HTN, CHF, COPD, HLD, CAD, Seizures, Meningioma. Presented to [facility] 2/16 with seizure activity.  Intubated 2/16-2/21. Recent Hospitalization 10/7-10/16. Found unresponsive, intubated, concerning for seizures. 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 Ultimately Trach and PEG was placed as mental status did not improve. Discharged to [facility]. Decannulation shortly after due to improvement. Went to [facility], since has been weak and wheelchair bound, able to feed self, alert and oriented   Assessment / Plan / Recommendation Clinical Impression  Pt presents with s/s of a post-extubation dysphagia marked by hoarse voice with coughing associated with thin liquids.  Appeared to tolerate nectar thick liquids and purees with no overt suggestion of aspiration. RR reached low 30s while eating, with indications for decreased coordination of swallow/respiratory sequencing. Given length of intubation and tachypnea, recommend proceeding with MBS next date.  For tonight, allow occasional sips of nectar thick liquid from floor stock; give meds whole with puree. D/W RN, who agrees with plan.  SLP Visit Diagnosis: Dysphagia, unspecified (R13.10)    Aspiration Risk       Diet Recommendation   nectar thick liquid from floor stock  Medication Administration: Whole meds with puree    Other  Recommendations Oral Care Recommendations: Oral care BID   Follow up Recommendations Other (comment)(tba)  Frequency and Duration            Prognosis        Swallow Study   General Date of Onset: 06/26/17 HPI: 82 year old female with PMH of HTN, CHF, COPD, HLD, CAD, Seizures, Meningioma. Presented to [facility] 2/16 with seizure activity.  Intubated 2/16-2/21. Recent Hospitalization 10/7-10/16.  Found unresponsive, intubated, concerning for seizures. 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 Ultimately Trach and PEG was placed as mental status did not improve. Discharged to [facility]. Decannulation shortly after due to improvement. Went to [facility], since has been weak and wheelchair bound, able to feed self, alert and oriented Type of Study: Bedside Swallow Evaluation Diet Prior to this Study: NPO Temperature Spikes Noted: No Respiratory Status: Nasal cannula History of Recent Intubation: Yes Length of Intubations (days): 21 days Date extubated: 06/30/17 Behavior/Cognition: Alert Oral Cavity Assessment: Within Functional Limits Oral Care Completed by SLP: Recent completion by staff Oral Cavity - Dentition: Edentulous Vision: Functional for self-feeding Self-Feeding Abilities: Needs assist Patient Positioning: Upright in bed Baseline Vocal Quality: Hoarse Volitional Cough: Strong Volitional Swallow: Able to elicit    Oral/Motor/Sensory Function     Ice Chips Ice chips: Within functional limits   Thin Liquid Thin Liquid: Impaired Presentation: Straw Oral Phase Impairments: Reduced labial seal Oral Phase Functional Implications: Left anterior spillage Pharyngeal  Phase Impairments: Cough - Immediate    Nectar Thick Nectar Thick Liquid: Within functional limits   Honey Thick Honey Thick Liquid: Not tested   Puree Puree: Within functional limits   Solid   GO   Solid: Not tested        Juan Quam Laurice 07/01/2017,2:25 PM

## 2017-07-01 NOTE — Progress Notes (Addendum)
Nutrition Follow-up  INTERVENTION:   If diet advances will supplement as appropriate to meet needs  If pt fails swallow eval recommend Cortrak tube Jevity 1.2 @ 45 ml/hr  30 ml Prostat TID  Provides: 1596 kcal, 104 grams protein, and 875 ml free water.   NUTRITION DIAGNOSIS:   Inadequate oral intake related to inability to eat as evidenced by NPO status. Ongoing.   GOAL:   Patient will meet greater than or equal to 90% of their needs Not met.   MONITOR:   Vent status, TF tolerance  ASSESSMENT:   Pt with PMH of HTN, CHF, COPD, HLD, CAD, Sz, meningioma, recent hospitalization 10/7-10/16 for sz. Pt ultimately had trach went to [facility] and was decannulated, went to rehab was able feed herself but was weak and wheelchair bound. Pt admitted 2/16 with seizure activity for 45 min. Pt intubated.   Pt discussed during ICU rounds and with RN.  Pt failed bedside swallow eval, plan for [facility] 2/23  2/21 extubated  Medications reviewed and include: [facility] Labs reviewed CBG's: 110-92-110 UOP: 3070 ml Pt remains 7.2 L positive since admission  Diet Order:  No diet orders on file  EDUCATION NEEDS:   No education needs have been identified at this time  Skin:  Skin Assessment: (left leg non-pressure wound)  Last BM:  2/21  Height:   Ht Readings from Last 1 Encounters:  06/27/17 '5\' 4"'$  (1.626 m)    Weight:   Wt Readings from Last 1 Encounters:  07/01/17 173 lb 15.1 oz (78.9 kg)    Ideal Body Weight:  54.5 kg  BMI:  Body mass index is 29.86 kg/m.  Estimated Nutritional Needs:   Kcal:  1473-1600  Protein:  92-110 grams  Fluid:  > 1.5 L/day  Maylon Peppers RD, LDN, CNSC [phone] Pager [phone] After Hours Pager

## 2017-07-02 ENCOUNTER — Inpatient Hospital Stay (HOSPITAL_COMMUNITY): Payer: Medicare Other

## 2017-07-02 ENCOUNTER — Encounter (HOSPITAL_COMMUNITY): Payer: Self-pay

## 2017-07-02 LAB — BASIC METABOLIC PANEL
ANION GAP: 11 (ref 5–15)
BUN: 14 mg/dL (ref 6–20)
CALCIUM: 10 mg/dL (ref 8.9–10.3)
CO2: 30 mmol/L (ref 22–32)
Chloride: 104 mmol/L (ref 101–111)
Creatinine, Ser: 0.5 mg/dL (ref 0.44–1.00)
GLUCOSE: 119 mg/dL — AB (ref 65–99)
POTASSIUM: 3.6 mmol/L (ref 3.5–5.1)
SODIUM: 145 mmol/L (ref 135–145)

## 2017-07-02 LAB — GLUCOSE, CAPILLARY
GLUCOSE-CAPILLARY: 122 mg/dL — AB (ref 65–99)
GLUCOSE-CAPILLARY: 132 mg/dL — AB (ref 65–99)
Glucose-Capillary: 116 mg/dL — ABNORMAL HIGH (ref 65–99)
Glucose-Capillary: 132 mg/dL — ABNORMAL HIGH (ref 65–99)
Glucose-Capillary: 112 mg/dL — ABNORMAL HIGH (ref 65–99)

## 2017-07-02 LAB — VALPROIC ACID LEVEL: VALPROIC ACID LVL: 35 ug/mL — AB (ref 50.0–100.0)

## 2017-07-02 MED ORDER — ALBUTEROL SULFATE (2.5 MG/3ML) 0.083% IN NEBU
2.5000 mg | INHALATION_SOLUTION | RESPIRATORY_TRACT | Status: DC | PRN
  Administered 2017-07-02 – 2017-07-03 (×2): 2.5 mg via RESPIRATORY_TRACT
  Filled 2017-07-02 (×2): qty 3

## 2017-07-02 MED ORDER — RESOURCE THICKENUP CLEAR PO POWD
ORAL | Status: DC | PRN
Start: 2017-07-02 — End: 2017-07-08
  Filled 2017-07-02: qty 125

## 2017-07-02 MED ORDER — LISINOPRIL 2.5 MG PO TABS
2.5000 mg | ORAL_TABLET | Freq: Every day | ORAL | Status: DC
  Administered 2017-07-02 – 2017-07-08 (×7): 2.5 mg
  Filled 2017-07-02 (×7): qty 1

## 2017-07-02 MED ORDER — KCL-LACTATED RINGERS-D5W 20 MEQ/L IV SOLN
INTRAVENOUS | Status: AC
  Administered 2017-07-02 – 2017-07-03 (×3): via INTRAVENOUS
  Filled 2017-07-02 (×3): qty 1000

## 2017-07-02 MED ORDER — FENTANYL CITRATE (PF) 100 MCG/2ML IJ SOLN
25.0000 ug | Freq: Once | INTRAMUSCULAR | Status: AC
  Administered 2017-07-02: 25 ug via INTRAVENOUS
  Filled 2017-07-02: qty 2

## 2017-07-02 NOTE — Progress Notes (Signed)
PULMONARY / CRITICAL CARE MEDICINE   Name: Melissa Cochran MRN: [id number] DOB: 01-Aug-1935    ADMISSION DATE:  06/26/2017   CHIEF COMPLAINT:  Unresponsive, post ictal  HISTORY OF PRESENT ILLNESS:      82 year old female with PMH of HTN, CHF, COPD, HLD, CAD, Seizures, Meningioma   Recent Hospitalization 10/7-10/16. Found unresponsive, intubated, concerning for seizures. 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 Blood cultures positive for staph, however believed to be contaminant. UTI due to ESBL E.Coli. EEG with sharp activity over the right hemisphere which suggests focal disturbance. Ultimately Trach and PEG was placed as mental status did not improve. Discharged to [facility]. Decannulation shortly after due to improvement. Went to [facility], since has been weak and wheelchair bound, able to feed self, alert and oriented.   Presented to [facility] ED on 2/16 with reported seizure activity for 45 minutes. Given 4 mg versed per EMS. Upon arrival to ED given 2 mg of Ativan without improvement. Intubated for airway protection and transferred to [facility].  He was extubated on 2/21 and so far this has been well-tolerated.  She is verbally interactive with me this morning with appropriate responses.  She has shown no further seizure activity despite a valproic acid level which remains subtherapeutic.   PAST MEDICAL HISTORY :  She  has a past medical history of Adnexal mass, Anxiety, Arthritis, Bladder tumor, Bleeding ulcer, CHF (congestive heart failure) ([place]), CHF (congestive heart failure) ([place]), COPD (chronic obstructive pulmonary disease) ([place]), Coronary artery disease, GERD (gastroesophageal reflux disease), Heart block, History of bleeding ulcers, Hyperlipemia, Hypertension, Pneumonia, and Shortness of breath dyspnea.  PAST SURGICAL HISTORY: She  has a past surgical history that includes Cardiac surgery; Abdominal hysterectomy; Stomach surgery; Hip surgery; Cardiac catheterization (Left,  10/21/2015); Cardiac catheterization (N/A, 10/21/2015); Transurethral resection of bladder tumor (N/A, 01/20/2016); Cystoscopy w/ retrogrades (Bilateral, 01/20/2016); Cystoscopy with stent placement (Right, 01/20/2016); and Tracheostomy tube placement (N/A, 03/02/2017).  No Known Allergies  No current facility-administered medications on file prior to encounter.    Current Outpatient Medications on File Prior to Encounter  Medication Sig  . acetaminophen (TYLENOL) 325 MG tablet Take 2 tablets (650 mg total) by mouth every 6 (six) hours as needed for mild pain (or Fever >/= 101).  . Amino Acids-Protein Hydrolys (FEEDING SUPPLEMENT, PRO-STAT SUGAR FREE 64,) LIQD Place 30 mLs into feeding tube 2 (two) times daily. (Patient taking differently: Take 30 mLs by mouth 3 (three) times daily. )  . amLODipine (NORVASC) 10 MG tablet Take 10 mg by mouth daily.  Marland Kitchen aspirin EC 81 MG tablet Take 81 mg by mouth daily.  Marland Kitchen atorvastatin (LIPITOR) 10 MG tablet Take 10 mg by mouth at bedtime.  . carvedilol (COREG) 6.25 MG tablet Take 6.25 mg by mouth 2 (two) times daily.  Marland Kitchen levETIRAcetam (KEPPRA) 750 MG tablet Take 750 mg by mouth 2 (two) times daily.  . Melatonin 3 MG TABS Take 3 mg by mouth at bedtime.  . Multiple Vitamin (THEREMS) TABS Take 1 tablet by mouth daily.  . sertraline (ZOLOFT) 25 MG tablet Take 25 mg by mouth daily.  . tamsulosin (FLOMAX) 0.4 MG CAPS capsule Take 0.4 mg by mouth daily.  . Valproate Sodium (DEPAKENE) 250 MG/5ML SOLN solution Take 500 mg by mouth 2 (two) times daily.  Marland Kitchen acetaminophen (TYLENOL) 650 MG suppository Place 1 suppository (650 mg total) rectally every 6 (six) hours as needed for mild pain (or Fever >/= 101). (Patient not taking: Reported on 06/26/2017)  . acyclovir  650 mg in dextrose 5 % 100 mL Inject 650 mg into the vein daily. (Patient not taking: Reported on 06/26/2017)  . albuterol (PROVENTIL) (2.5 MG/3ML) 0.083% nebulizer solution Take 3 mLs (2.5 mg total) by nebulization every 2  (two) hours as needed for wheezing. (Patient not taking: Reported on 06/26/2017)  . chlorhexidine gluconate, MEDLINE KIT, (PERIDEX) 0.12 % solution 15 mLs by Mouth Rinse route 2 (two) times daily. (Patient not taking: Reported on 06/26/2017)  . hydrALAZINE (APRESOLINE) 20 MG/ML injection Inject 0.5 mLs (10 mg total) into the vein every 6 (six) hours as needed (SBP>180 or DBP>100). (Patient not taking: Reported on 06/26/2017)  . levETIRAcetam 500 mg in sodium chloride 0.9 % 100 mL Inject 500 mg into the vein every 12 (twelve) hours. (Patient not taking: Reported on 06/26/2017)  . meropenem 1 g in sodium chloride 0.9 % 100 mL Inject 1 g into the vein every 12 (twelve) hours. (Patient not taking: Reported on 06/26/2017)  . metoprolol tartrate (LOPRESSOR) 5 MG/5ML SOLN injection Inject 2.5-5 mLs (2.5-5 mg total) into the vein every 6 (six) hours as needed (for heart rate >120). (Patient not taking: Reported on 06/26/2017)  . mouth rinse LIQD solution 15 mLs by Mouth Rinse route as needed. (Patient not taking: Reported on 06/26/2017)  . Multiple Vitamin (MULTIVITAMIN) LIQD Place 15 mLs into feeding tube daily. (Patient not taking: Reported on 06/26/2017)  . Nutritional Supplements (FEEDING SUPPLEMENT, VITAL 1.5 CAL,) LIQD Place 1,000 mLs into feeding tube daily. (Patient not taking: Reported on 06/26/2017)  . ondansetron (ZOFRAN) 4 MG/2ML SOLN injection Inject 2 mLs (4 mg total) into the vein every 6 (six) hours as needed for nausea. (Patient not taking: Reported on 06/26/2017)  . pantoprazole sodium (PROTONIX) 40 mg/20 mL PACK Place 20 mLs (40 mg total) into feeding tube daily at 12 noon. (Patient not taking: Reported on 06/26/2017)  . senna-docusate (SENOKOT-S) 8.6-50 MG tablet Place 1 tablet into feeding tube 2 (two) times daily. (Patient not taking: Reported on 06/26/2017)  . valproate 1,000 mg in dextrose 5 % 50 mL Inject 1,000 mg into the vein every 8 (eight) hours. (Patient not taking: Reported on 06/26/2017)  .  Water For Irrigation, Sterile (FREE WATER) SOLN Place 100 mLs into feeding tube every 8 (eight) hours. (Patient not taking: Reported on 06/26/2017)    FAMILY HISTORY:  Her indicated that her mother is deceased. She indicated that her father is deceased. She indicated that the status of her neg hx is unknown.   SOCIAL HISTORY: She  reports that she quit smoking about 19 years ago. She has a 40.00 pack-year smoking history. she has never used smokeless tobacco. She reports that she does not drink alcohol or use drugs.  REVIEW OF SYSTEMS:   Not obtainable  SUBJECTIVE:  As above  VITAL SIGNS: BP (!) 154/76   Pulse (!) 107   Temp 99.1 F (37.3 C) (Oral)   Resp (!) 26   Ht '5\' 4"'$  (1.626 m)   Wt 174 lb 2.6 oz (79 kg)   SpO2 98%   BMI 29.90 kg/m   HEMODYNAMICS:    VENTILATOR SETTINGS:    INTAKE / OUTPUT: I/O last 3 completed shifts: In: 2040 [I.V.:1470; IV Piggyback:570] Out: 4132 [Urine:4425]  PHYSICAL EXAMINATION: General: Chronically ill-appearing elderly female, who is extubated and in no distress.  Neuro: She is remarkably oriented x3 and appropriately interactive.  Pupils are equal and she moves all fours on request.  She appears to have an intention tremor.  Cardiovascular: S1 and S2 are irregularly irregular without murmur rub or gallop  Lungs: Respirations are unlabored,  there is symmetric air movement, few scattered rhonchi, no wheezes.  scattered wheezes.   Abdomen: The abdomen is obese, soft without any organomegaly masses tenderness 1+ anasarca LABS:  BMET Recent Labs  Lab 06/30/17 0338 07/01/17 0300 07/02/17 0300  NA 143 144 145  K 3.6 3.7 3.6  CL 109 105 104  CO2 '26 27 30  '$ BUN 21* 15 14  CREATININE 0.42* 1.18* 0.50  GLUCOSE 95 104* 119*    Electrolytes Recent Labs  Lab 06/29/17 1707 06/30/17 0338 06/30/17 1616 07/01/17 0300 07/02/17 0300  CALCIUM  --  9.1  --  9.8 10.0  MG 1.9 1.9 1.9  --   --   PHOS 2.3* 2.5 2.4*  --   --      CBC Recent Labs  Lab 06/26/17 0319 06/29/17 0443 06/30/17 0338  WBC 7.0 5.3 5.2  HGB 9.1* 9.0* 8.9*  HCT 30.8* 30.4* 30.0*  PLT 259 193 195    Coag's Recent Labs  Lab 06/25/17 2104  INR 1.18    Sepsis Markers Recent Labs  Lab 06/25/17 2037 06/26/17 0319 06/27/17 0232  LATICACIDVEN 2.2* 2.5*  --   PROCALCITON <0.10  --  <0.10    ABG Recent Labs  Lab 06/26/17 0210 06/26/17 0416 06/29/17 0345  PHART 7.303* 7.390 7.443  PCO2ART 50.4* 41.9 37.0  PO2ART 71.0* 88.2 133*    Liver Enzymes Recent Labs  Lab 06/25/17 2037 06/29/17 0443  AST 31 16  ALT 13* 13*  ALKPHOS 72 66  BILITOT 0.7 0.3  ALBUMIN 3.2* 2.1*    Cardiac Enzymes Recent Labs  Lab 06/25/17 2037  TROPONINI <0.03    Glucose Recent Labs  Lab 07/01/17 1133 07/01/17 1551 07/01/17 2020 07/01/17 2343 07/02/17 0334 07/02/17 0738  GLUCAP 110* 125* 107* 112* 122* 116*    Imaging No results found.    CULTURES: Urine obtained at [facility] is growing greater than 10 to the fifth gram-negative rods an ESBL producing E. coli  ANTIBIOTICS: Meropenem started on 2/17   DISCUSSION:      This is an 82 year old with a prior history of ESBL producing gram-negative UTI and a known right convexity meningioma who was transferred to this hospital after prolonged seizure.  She has had several episodes of clinical seizures with subsequent postictal states since admission.  She was on meropenem for an ESBL producing UTI but we discovered that meropenem greatly accelerates the metabolism of valproic acid so the meropenem has been substituted.    ASSESSMENT / PLAN:  PULMONARY A:   Intubation has been well-tolerated.  She has no indication of aspiration nor does she have any difficulty in protecting her airway.    CARDIOVASCULAR A: Now appears to be in atrial fibrillation.  I added a small dose of Lopressor for rate control.    Echo shows mild LV systolic dysfunction, I am diuresing the patient, I  have added a small dose of ACE inhibitor today.    I am hesitant to add anticoagulation until it is clear that her seizures are controlled.  GASTROINTESTINAL A: Prophylaxis is with Protonix  INFECTIOUS A: She received 1 dose of  fosfomycin for ESBL producing E. coli in the urine  NEUROLOGIC A: Mental status is improved, suspect a resolving  Post ictal state. Adjustment of AED's per Neurology.     Lars Masson, MD Pulmonary and [place] Pager: [phone]  07/02/2017, 8:25 AM

## 2017-07-02 NOTE — Progress Notes (Addendum)
Neurology Progress Note   S:// Seen and examined. No sz overnight. More awake/alert  O:// Current vital signs: BP 134/90   Pulse 96   Temp 98 F (36.7 C) (Oral)   Resp (!) 21   Ht 5' 4" (1.626 m)   Wt 79 kg (174 lb 2.6 oz)   SpO2 96%   BMI 29.90 kg/m   Vital signs in last 24 hours: Temp:  [97.8 F (36.6 C)-99.7 F (37.6 C)] 98 F (36.7 C) (02/23 0800) Pulse Rate:  [30-116] 96 (02/23 0930) Resp:  [21-33] 21 (02/23 0930) BP: (124-179)/(69-101) 134/90 (02/23 0930) SpO2:  [93 %-100 %] 96 % (02/23 0930) Weight:  [79 kg (174 lb 2.6 oz)] 79 kg (174 lb 2.6 oz) (02/23 0336) UNCHANGED FROM YESTERDAY Awake alert oriented in no apparent distress S1-S2 heard regular rate rhythm Chest clear to auscultation Abdomen nondistended nontender Neurological exam next line patient awake alert oriented x3 Her speech is mildly dysarthric Naming comprehension and repetition are intact Cranial nerves: Pupils equal round reactive light, extra ocular movements intact, visual fields full, facial symmetry is maintained, facial sensation normal bilaterally, palate elevates midline, tongue midline. Motor exam: Able to lift both upper extremity is antigravity.  Wiggles toes to commands but not able to lift left leg antigravity.  Able to lift right leg for a very short duration of time antigravity Sensory exam: Appreciated light touch all over without any focal deficits Coronation: Not performed Gait exam: Deferred due to patient's medical condition  Medications  Current Facility-Administered Medications:  .  0.9 %  sodium chloride infusion, 250 mL, Intravenous, PRN, Omar Person, NP, Last Rate: 10 mL/hr at 07/02/17 1000, 250 mL at 07/02/17 1000 .  acetaminophen (TYLENOL) solution 650 mg, 650 mg, Oral, Q6H PRN, Omar Person, NP, 650 mg at 06/29/17 1713 .  acetaminophen (TYLENOL) suppository 650 mg, 650 mg, Rectal, Q4H PRN, Sharia Reeve, MD, 650 mg at 07/01/17 0052 .  albuterol  (PROVENTIL) (2.5 MG/3ML) 0.083% nebulizer solution 2.5 mg, 2.5 mg, Nebulization, Q3H PRN, Deterding, Guadelupe Sabin, MD, 2.5 mg at 07/02/17 0409 .  amLODipine (NORVASC) tablet 10 mg, 10 mg, Oral, Daily, Panchal, Amar, MD, 10 mg at 07/02/17 0926 .  chlorhexidine gluconate (MEDLINE KIT) (PERIDEX) 0.12 % solution 15 mL, 15 mL, Mouth Rinse, BID, Amie Portland, MD, 15 mL at 07/02/17 0759 .  Chlorhexidine Gluconate Cloth 2 % PADS 6 each, 6 each, Topical, Q0600, Sampson Goon, MD, 6 each at 07/01/17 0430 .  feeding supplement (PRO-STAT SUGAR FREE 64) liquid 30 mL, 30 mL, Per Tube, BID, Sampson Goon, MD, Stopped at 07/01/17 1203 .  feeding supplement (VITAL AF 1.2 CAL) liquid 1,000 mL, 1,000 mL, Per Tube, Continuous, Sampson Goon, MD, Stopped at 06/30/17 [phone]  furosemide (LASIX) injection 20 mg, 20 mg, Intravenous, Q12H, Sampson Goon, MD, 20 mg at 07/02/17 0800 .  heparin injection 5,000 Units, 5,000 Units, Subcutaneous, Q8H, Eubanks, Katalina M, NP, 5,000 Units at 07/02/17 0511 .  hydrALAZINE (APRESOLINE) injection 5 mg, 5 mg, Intravenous, Q4H PRN, Sampson Goon, MD, 5 mg at 07/02/17 0645 .  ipratropium (ATROVENT) nebulizer solution 0.5 mg, 0.5 mg, Nebulization, Q6H, Panchal, Amar, MD, 0.5 mg at 07/02/17 0827 .  labetalol (NORMODYNE,TRANDATE) injection 10-20 mg, 10-20 mg, Intravenous, Q2H PRN, Sharia Reeve, MD, 20 mg at 07/02/17 0542 .  levETIRAcetam (KEPPRA) 1,000 mg in sodium chloride 0.9 % 100 mL IVPB, 1,000 mg, Intravenous, Q12H, Scatliffe, Rise Paganini, MD, Stopped at 07/02/17 0307 .  lisinopril (PRINIVIL,ZESTRIL) tablet 2.5 mg, 2.5 mg, Per Tube, Daily, Sampson Goon, MD, 2.5 mg at 07/02/17 0926 .  LORazepam (ATIVAN) injection 2 mg, 2 mg, Intravenous, PRN, Sampson Goon, MD, 2 mg at 06/29/17 1025 .  MEDLINE mouth rinse, 15 mL, Mouth Rinse, BID, Sampson Goon, MD, 15 mL at 07/02/17 0933 .  metoprolol tartrate (LOPRESSOR) injection 2.5 mg, 2.5 mg, Intravenous, Q6H, Sampson Goon, MD, 2.5 mg at  07/02/17 0505 .  midazolam (VERSED) 50 mg in sodium chloride 0.9 % 50 mL (1 mg/mL) infusion, 0.5-8 mg/hr, Intravenous, Titrated, Oletta Darter Virgina Evener, MD, Stopped at 06/27/17 [phone] .  pantoprazole (PROTONIX) injection 40 mg, 40 mg, Intravenous, Daily, Hayden Pedro M, NP, 40 mg at 07/02/17 3570 .  phenylephrine (NEO-SYNEPHRINE) 10 mg in sodium chloride 0.9 % 250 mL (0.04 mg/mL) infusion, 0-200 mcg/min, Intravenous, Titrated, Omar Person, NP, Stopped at 06/26/17 [phone] .  sodium chloride flush (NS) 0.9 % injection 10-40 mL, 10-40 mL, Intracatheter, Q12H, Sampson Goon, MD, 10 mL at 07/02/17 0927 .  sodium chloride flush (NS) 0.9 % injection 10-40 mL, 10-40 mL, Intracatheter, PRN, Sampson Goon, MD .  valproate (DEPACON) 1,000 mg in dextrose 5 % 50 mL IVPB, 1,000 mg, Intravenous, Q8H, Omar Person, NP, Last Rate: 60 mL/hr at 07/02/17 0503, 1,000 mg at 07/02/17 0503 Labs CBC    Component Value Date/Time   WBC 5.2 06/30/2017 0338   RBC 3.46 (L) 06/30/2017 0338   HGB 8.9 (L) 06/30/2017 0338   HGB 13.1 01/23/2014 1125   HCT 30.0 (L) 06/30/2017 0338   HCT 41.3 01/23/2014 1125   PLT 195 06/30/2017 0338   PLT 244 01/23/2014 1125   MCV 86.7 06/30/2017 0338   MCV 91 01/23/2014 1125   MCH 25.7 (L) 06/30/2017 0338   MCHC 29.7 (L) 06/30/2017 0338   RDW 13.9 06/30/2017 0338   RDW 13.3 01/23/2014 1125   LYMPHSABS 2.1 06/30/2017 0338   LYMPHSABS 1.1 01/23/2014 1125   MONOABS 0.6 06/30/2017 0338   MONOABS 0.5 01/23/2014 1125   EOSABS 0.4 06/30/2017 0338   EOSABS 0.1 01/23/2014 1125   BASOSABS 0.0 06/30/2017 0338   BASOSABS 0.0 01/23/2014 1125    CMP     Component Value Date/Time   NA 145 07/02/2017 0300   NA 141 08/26/2015 1131   NA 131 (L) 01/23/2014 1125   K 3.6 07/02/2017 0300   K 3.9 01/23/2014 1125   CL 104 07/02/2017 0300   CL 94 (L) 01/23/2014 1125   CO2 30 07/02/2017 0300   CO2 32 01/23/2014 1125   GLUCOSE 119 (H) 07/02/2017 0300   GLUCOSE 91 01/23/2014 1125   BUN  14 07/02/2017 0300   BUN 16 08/26/2015 1131   BUN 11 01/23/2014 1125   CREATININE 0.50 07/02/2017 0300   CREATININE 0.72 01/23/2014 1125   CALCIUM 10.0 07/02/2017 0300   CALCIUM 9.4 01/23/2014 1125   PROT 4.6 (L) 06/29/2017 0443   PROT 7.0 01/23/2014 1125   ALBUMIN 2.1 (L) 06/29/2017 0443   ALBUMIN 3.4 01/23/2014 1125   AST 16 06/29/2017 0443   AST 11 (L) 01/23/2014 1125   ALT 13 (L) 06/29/2017 0443   ALT 16 01/23/2014 1125   ALKPHOS 66 06/29/2017 0443   ALKPHOS 123 (H) 01/23/2014 1125   BILITOT 0.3 06/29/2017 0443   BILITOT 0.4 01/23/2014 1125   GFRNONAA >60 07/02/2017 0300   GFRNONAA >60 01/23/2014 1125   GFRAA >60 07/02/2017 0300   GFRAA >60 01/23/2014  1125   VPA -35   Assessment:  Breakthrough seizures secondary to subtherapeutic antiepileptic levels and urosepsis. Mental status much improved.  No seizures. Depakote still remains subtherapeutic-likely due to interaction with meropenem. We will give till the end of the week for the levels to achieve steady state.  Recommendations: Continue with same doses of Depakote & Keppra Might take 5-7 days to obtain steady state. Check level Monday.  No additional dose of Keppra needed unless she has a breakthrough seizure. Medical management of toxic metabolic derangements per the primary team as you are. Neurology will be available as needed. Please call if breakthrough seizures occur. Please call neurology with questions.  -- Amie Portland, MD [facility] Pager: [phone] If 7pm to 7am, please call on call as listed on AMION.  CRITICAL CARE ATTESTATION This patient is critically ill and at significant risk of neurological worsening, death and care requires constant monitoring of vital signs, hemodynamics,respiratory and cardiac monitoring. I spent 30  minutes of neurocritical care time performing neurological assessment, discussion with family, other specialists and medical decision making of high complexityin  the care of  this patient.

## 2017-07-02 NOTE — Clinical Social Work Note (Signed)
Clinical Social Work Assessment  Patient Details  Name: Melissa Cochran MRN: [id number] Date of Birth: [birth year]  Date of referral:  07/02/17               Reason for consult:  Facility Placement                Permission sought to share information with:  Facility Sport and exercise psychologist, Family Supports Permission granted to share information::  Yes, Verbal Permission Granted  Name::     Jarely Juncaj  Agency::     Relationship::  Daughter  Contact Information:  [phone]  Housing/Transportation Living arrangements for the past 2 months:  [place] of Information:  Patient Patient Interpreter Needed:  None Criminal Activity/Legal Involvement Pertinent to Current Situation/Hospitalization:  No - Comment as needed Significant Relationships:  Adult Children Lives with:  Self Do you feel safe going back to the place where you live?  Yes Need for family participation in patient care:  No (Coment)  Care giving concerns: No care giving concerns, patient admitted from facility and to return at discharge.  Social Worker assessment / plan:   CSW met with patient at bedside, patient slow to respond upon verbal questions but stated she is willing to return to [facility] upon discharge.   Employment status:  Retired Forensic scientist:  Medicare PT Recommendations:  [place] / Referral to community resources:     Patient/Family's Response to care:  CSW inquired about following up with any family members and patient stated no.  Patient/Family's Understanding of and Emotional Response to Diagnosis, Current Treatment, and Prognosis:  Patient understands treatment plan and that she will discharge back to [facility] upon discharge.  Emotional Assessment Appearance:  Appears stated age Attitude/Demeanor/Rapport:    Affect (typically observed):  Accepting, Appropriate Orientation:  Oriented  to Self, Oriented to Place, Oriented to  Time, Oriented to Situation(Slow to respond, but answered questions verbally) Alcohol / Substance use:    Psych involvement (Current and /or in the community):     Discharge Needs  Concerns to be addressed:    Readmission within the last 30 days:  No Current discharge risk:    Barriers to Discharge:      Archie Endo, LCSW 07/02/2017, 12:35 PM

## 2017-07-02 NOTE — Progress Notes (Signed)
Modified Barium Swallow Progress Note  Patient Details  Name: Melissa Cochran MRN: [id number] Date of Birth: Dec 27, 1935  Today's Date: 07/02/2017  Modified Barium Swallow completed.  Full report located under Chart Review in the Imaging Section.  Brief recommendations include the following:  Clinical Impression  Patient presents with moderate oral, pharyngeal and cervical esophageal dysphagia. Oral stage is characterized by prolonged bolus formation and delayed oral transit. Mastication is prolonged with soft solids due to lack of dentition. Pharyngeal stage characterized by mildly decreased tongue base retraction, leading to mild vallecular, pyriform residue with puree, and moderate residue with soft solids. Swallow initiation is at level of the pyriform sinuses with liquids, resulting in delayed closure of the laryngeal vestibule. There is significant penetration to the level of the vocal cords before the swallow with thin liquids and cup sips of nectar, some of which reversed with a reflexive subsequent swallow. Cued cough reduced but did not completely clear penetrate. Pt ultimately silently aspirated thin liquids after the swallow. There was no penetration or aspiration with teaspoons of nectar, puree. Slow clearance of contrast through the cervical esophagus, with intermittent backflow noted. With soft solids, backflow through the cricopharyngeus penetrated the laryngeal vestibule and was aspirated silently during the swallow. Subsequently, pt's work of breathing increased, and respiratory rate elevated to low-mid 30s. Ceased testing and SLP cleaned oral cavity, extracting remaining pieces of partially masticated solid. Pt positioned upright in bed and vitals stabilized. Recommend pt continue nectar thick liquids, purees but via teaspoon, meds crushed in puree, with esophageal precautions. Maintain upright posture during and for 30 minutes after meals. Pt has a history of GERD, and given  esophageal backflow long-term use of thickened liquids may be contraindicated.   Swallow Evaluation Recommendations       SLP Diet Recommendations: Nectar thick liquid;Dysphagia 1 (Puree) solids   Liquid Administration via: Spoon   Medication Administration: Crushed with puree   Supervision: Full supervision/cueing for compensatory strategies   Compensations: Slow rate;Small sips/bites   Postural Changes: Seated upright at 90 degrees;Remain semi-upright after after feeds/meals (Comment)       Other Recommendations: Order thickener from [place], [place], [place] Speech-Language Pathologist [phone]  Aliene Altes 07/02/2017,12:58 PM

## 2017-07-02 NOTE — Plan of Care (Signed)
Pt with increased appetite.

## 2017-07-02 NOTE — NC FL2 (Signed)
[place] LEVEL OF CARE SCREENING TOOL     IDENTIFICATION  Patient Name: LIBBEY DUCE Birthdate: 07-14-1935 Sex: female Admission Date (Current Location): 06/26/2017  [place] and [place] Number:  [place] and Address:  [facility], [place] [address] [place], [place] [address]      Provider Number: [id number]  Attending Physician Name and Address:  Kandice Hams, MD  Relative Name and Phone Number:  Elisabetta Mishra, [phone]    Current Level of Care: Hospital Recommended Level of Care: [place] Prior Approval Number:    Date Approved/Denied:   PASRR Number:    Discharge Plan: SNF    Current Diagnoses: Patient Active Problem List   Diagnosis Date Noted  . E-coli UTI, ESBL  06/30/2017  . A-fib ([place]) 06/30/2017  . Need for protective airway ventilation 06/30/2017  . Seizures ([place]) 06/26/2017  . Acute renal failure ([place])   . Elevated troponin I level   . Encephalitis   . Acute renal failure with tubular necrosis ([place])   . Palliative care encounter   . Pressure injury of skin 02/17/2017  . 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 Bilateral lower extremity edema 10/08/2016  . Atherosclerosis of both lower extremities with bilateral ulceration of midfeet ([place]) 08/24/2016  . Venous ulcer of ankle ([place]) 07/08/2016  . HTN (hypertension), malignant 07/08/2016  . DVT (deep venous thrombosis) ([place]) 07/02/2016  . Swelling of limb 06/08/2016  . Pain in limb 06/08/2016  . Altered mental status 01/22/2016  . S/P coronary artery stent placement 10/30/2015  . Atherosclerotic heart disease of native coronary artery without angina pectoris 10/21/2015  . Preop cardiovascular exam 10/08/2015  . SOB (shortness of breath) on exertion 10/08/2015  . 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 Mass of lower lobe of left lung, incidetnal on CT 09/2015, smoker.  09/29/2015   . Other disorders of lung 09/29/2015  . Other microscopic hematuria 08/26/2015  . Kidney stone 08/26/2015  . Urge incontinence 08/26/2015  . Pneumonia 01/24/2015  . PNA (pneumonia) 01/24/2015  . Venous insufficiency of both lower extremities 12/09/2014  . Neuropathy 12/09/2014  . Essential (primary) hypertension 12/09/2014  . Polyneuropathy 12/09/2014  . Chronic venous insufficiency 12/09/2014  . Adnexal mass 11/28/2013  . History of cardiac catheterization 11/28/2013  . BP (high blood pressure) 11/28/2013  . Hyperlipidemia 11/28/2013  . Other specified postprocedural states 11/28/2013    Orientation RESPIRATION BLADDER Height & Weight     Self, Situation, Place, Time  O2 Incontinent Weight: 174 lb 2.6 oz (79 kg) Height:  '5\' 4"'$  (162.6 cm)  BEHAVIORAL SYMPTOMS/MOOD NEUROLOGICAL BOWEL NUTRITION STATUS    Convulsions/Seizures Incontinent Diet(Contingent upon discharge)  AMBULATORY STATUS COMMUNICATION OF NEEDS Skin     Verbally Skin abrasions(Small abrasions on left leg)                       Personal Care Assistance Level of Assistance              Functional Limitations Info  (Patient squinting during CSW interaction, slow to speak but understandable)          SPECIAL CARE FACTORS FREQUENCY  PT (By licensed PT), OT (By licensed OT)     PT Frequency: 5x OT Frequency: 5x            Contractures Contractures Info: Not present    Additional Factors Info  Code Status Code Status Info: FULL  CODE             Current Medications (07/02/2017):  This is the current hospital active medication list Current Facility-Administered Medications  Medication Dose Route Frequency Provider Last Rate Last Dose  . 0.9 %  sodium chloride infusion  250 mL Intravenous PRN Omar Person, NP 10 mL/hr at 07/02/17 1100 250 mL at 07/02/17 1100  . acetaminophen (TYLENOL) solution 650 mg  650 mg Oral Q6H PRN Omar Person, NP   650 mg at 06/29/17 1713  .  acetaminophen (TYLENOL) suppository 650 mg  650 mg Rectal Q4H PRN Sharia Reeve, MD   650 mg at 07/01/17 0052  . albuterol (PROVENTIL) (2.5 MG/3ML) 0.083% nebulizer solution 2.5 mg  2.5 mg Nebulization Q3H PRN Deterding, Guadelupe Sabin, MD   2.5 mg at 07/02/17 0409  . amLODipine (NORVASC) tablet 10 mg  10 mg Oral Daily Sharia Reeve, MD   10 mg at 07/02/17 0926  . chlorhexidine gluconate (MEDLINE KIT) (PERIDEX) 0.12 % solution 15 mL  15 mL Mouth Rinse BID Amie Portland, MD   15 mL at 07/02/17 0759  . Chlorhexidine Gluconate Cloth 2 % PADS 6 each  6 each Topical Q0600 Sampson Goon, MD   6 each at 07/02/17 1224  . dextrose 5% in lactated ringers with KCl 20 mEq/L infusion   Intravenous Continuous Sampson Goon, MD 30 mL/hr at 07/02/17 1224    . feeding supplement (PRO-STAT SUGAR FREE 64) liquid 30 mL  30 mL Per Tube BID Sampson Goon, MD   Stopped at 07/01/17 1203  . feeding supplement (VITAL AF 1.2 CAL) liquid 1,000 mL  1,000 mL Per Tube Continuous Sampson Goon, MD   Stopped at 06/30/17 [phone]  . furosemide (LASIX) injection 20 mg  20 mg Intravenous Q12H Sampson Goon, MD   20 mg at 07/02/17 0800  . heparin injection 5,000 Units  5,000 Units Subcutaneous Q8H Omar Person, NP   5,000 Units at 07/02/17 0511  . hydrALAZINE (APRESOLINE) injection 5 mg  5 mg Intravenous Q4H PRN Sampson Goon, MD   5 mg at 07/02/17 0645  . ipratropium (ATROVENT) nebulizer solution 0.5 mg  0.5 mg Nebulization Q6H Sharia Reeve, MD   0.5 mg at 07/02/17 0827  . labetalol (NORMODYNE,TRANDATE) injection 10-20 mg  10-20 mg Intravenous Q2H PRN Sharia Reeve, MD   20 mg at 07/02/17 0542  . levETIRAcetam (KEPPRA) 1,000 mg in sodium chloride 0.9 % 100 mL IVPB  1,000 mg Intravenous Q12H Scatliffe, Rise Paganini, MD   Stopped at 07/02/17 0307  . lisinopril (PRINIVIL,ZESTRIL) tablet 2.5 mg  2.5 mg Per Tube Daily Sampson Goon, MD   2.5 mg at 07/02/17 [phone]  . LORazepam (ATIVAN) injection 2 mg  2 mg Intravenous PRN Sampson Goon,  MD   2 mg at 06/29/17 1025  . MEDLINE mouth rinse  15 mL Mouth Rinse BID Sampson Goon, MD   15 mL at 07/02/17 0933  . metoprolol tartrate (LOPRESSOR) injection 2.5 mg  2.5 mg Intravenous Q6H Sampson Goon, MD   2.5 mg at 07/02/17 1224  . midazolam (VERSED) 50 mg in sodium chloride 0.9 % 50 mL (1 mg/mL) infusion  0.5-8 mg/hr Intravenous Titrated Anders Simmonds, MD   Stopped at 06/27/17 [phone]  . pantoprazole (PROTONIX) injection 40 mg  40 mg Intravenous Daily Omar Person, NP   40 mg at 07/02/17 0923  . phenylephrine (NEO-SYNEPHRINE) 10 mg in sodium chloride 0.9 %  250 mL (0.04 mg/mL) infusion  0-200 mcg/min Intravenous Titrated Omar Person, NP   Stopped at 06/26/17 [phone]  . sodium chloride flush (NS) 0.9 % injection 10-40 mL  10-40 mL Intracatheter Q12H Sampson Goon, MD   10 mL at 07/02/17 7353  . sodium chloride flush (NS) 0.9 % injection 10-40 mL  10-40 mL Intracatheter PRN Sampson Goon, MD      . valproate (DEPACON) 1,000 mg in dextrose 5 % 50 mL IVPB  1,000 mg Intravenous Q8H Omar Person, NP 60 mL/hr at 07/02/17 0503 1,000 mg at 07/02/17 0503     Discharge Medications: Please see discharge summary for a list of discharge medications.  Relevant Imaging Results:  Relevant Lab Results:   Additional Information  SSN: [id number]  Archie Endo, LCSWA

## 2017-07-03 ENCOUNTER — Encounter (HOSPITAL_COMMUNITY): Payer: Self-pay

## 2017-07-03 LAB — GLUCOSE, CAPILLARY
GLUCOSE-CAPILLARY: 103 mg/dL — AB (ref 65–99)
GLUCOSE-CAPILLARY: 105 mg/dL — AB (ref 65–99)
GLUCOSE-CAPILLARY: 110 mg/dL — AB (ref 65–99)
GLUCOSE-CAPILLARY: 95 mg/dL (ref 65–99)
Glucose-Capillary: 116 mg/dL — ABNORMAL HIGH (ref 65–99)

## 2017-07-03 LAB — BASIC METABOLIC PANEL
Anion gap: 10 (ref 5–15)
BUN: 18 mg/dL (ref 6–20)
CHLORIDE: 104 mmol/L (ref 101–111)
CO2: 31 mmol/L (ref 22–32)
Calcium: 9.8 mg/dL (ref 8.9–10.3)
Creatinine, Ser: 0.51 mg/dL (ref 0.44–1.00)
GFR calc non Af Amer: >60 mL/min (ref >60)
Glucose, Bld: 108 mg/dL — ABNORMAL HIGH (ref 65–99)
POTASSIUM: 3.2 mmol/L — AB (ref 3.5–5.1)
Sodium: 145 mmol/L (ref 135–145)

## 2017-07-03 MED ORDER — FUROSEMIDE 10 MG/ML IJ SOLN
20.0000 mg | Freq: Every day | INTRAMUSCULAR | Status: DC
  Administered 2017-07-03 – 2017-07-06 (×4): 20 mg via INTRAVENOUS
  Filled 2017-07-03 (×2): qty 4
  Filled 2017-07-03: qty 2

## 2017-07-03 MED ORDER — POTASSIUM CHLORIDE 10 MEQ/100ML IV SOLN
10.0000 meq | INTRAVENOUS | Status: AC
  Administered 2017-07-03 (×3): 10 meq via INTRAVENOUS
  Filled 2017-07-03 (×3): qty 100

## 2017-07-03 MED ORDER — FENTANYL CITRATE (PF) 100 MCG/2ML IJ SOLN
25.0000 ug | Freq: Once | INTRAMUSCULAR | Status: AC
  Administered 2017-07-03: 25 ug via INTRAVENOUS
  Filled 2017-07-03: qty 2

## 2017-07-03 MED ORDER — PANTOPRAZOLE SODIUM 40 MG PO PACK
40.0000 mg | PACK | Freq: Every day | ORAL | Status: DC
  Administered 2017-07-06 – 2017-07-08 (×3): 40 mg
  Filled 2017-07-03: qty 20

## 2017-07-03 NOTE — Progress Notes (Deleted)
[facility] ADULT ICU REPLACEMENT PROTOCOL FOR AM LAB REPLACEMENT ONLY  The patient does apply for the [facility] Adult ICU Electrolyte Replacment Protocol based on the criteria listed below:   1. Is GFR >/= 40 ml/min? Yes.    Patient's GFR today is >60 2. Is urine output >/= 0.5 ml/kg/hr for the last 6 hours? Yes.   Patient's UOP is 0.9 ml/kg/hr 3. Is BUN < 60 mg/dL? Yes.    Patient's BUN today is 18 4. Abnormal electrolyte  K 3.2 5. Ordered repletion with: per protocol 6. If a panic level lab has been reported, has the CCM MD in charge been notified? Yes.  .   Physician:  E Deterding  Christeen Douglas 07/03/2017 6:29 AM

## 2017-07-03 NOTE — Progress Notes (Signed)
SLP Cancellation Note  Patient Details Name: Melissa Cochran MRN: [id number] DOB: 1935/09/08   Cancelled treatment:       Reason Eval/Treat Not Completed: Patient at procedure or test/unavailable. Pt currently having breathing treatment. Will continue efforts.  Deneise Lever, [place], [place] Speech-Language Pathologist [phone]   Aliene Altes 07/03/2017, 2:59 PM

## 2017-07-03 NOTE — Progress Notes (Signed)
PULMONARY / CRITICAL CARE MEDICINE   Name: Melissa Cochran MRN: [id number] DOB: Dec 19, 1935    ADMISSION DATE:  06/26/2017   CHIEF COMPLAINT:  Unresponsive, post ictal  HISTORY OF PRESENT ILLNESS:      82 year old female with PMH of HTN, CHF, COPD, HLD, CAD, Seizures, Meningioma   Recent Hospitalization 10/7-10/16. Found unresponsive, intubated, concerning for seizures. 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 Blood cultures positive for staph, however believed to be contaminant. UTI due to ESBL E.Coli. EEG with sharp activity over the right hemisphere which suggests focal disturbance. Ultimately Trach and PEG was placed as mental status did not improve. Discharged to [facility]. Decannulation shortly after due to improvement. Went to [facility], since has been weak and wheelchair bound, able to feed self, alert and oriented.   Presented to [facility] ED on 2/16 with reported seizure activity for 45 minutes. Given 4 mg versed per EMS. Upon arrival to ED given 2 mg of Ativan without improvement. Intubated for airway protection and transferred to [facility].  She was extubated on 2/21 and so far this has been well-tolerated.  She is verbally interactive with me this morning with appropriate responses.  She has shown no further seizure activity despite a valproic acid level which remains subtherapeutic.  She is remarkably alert and interactive this morning.  She has no complaint other than being cold.   PAST MEDICAL HISTORY :  She  has a past medical history of Adnexal mass, Anxiety, Arthritis, Bladder tumor, Bleeding ulcer, CHF (congestive heart failure) ([place]), CHF (congestive heart failure) ([place]), COPD (chronic obstructive pulmonary disease) ([place]), Coronary artery disease, GERD (gastroesophageal reflux disease), Heart block, History of bleeding ulcers, Hyperlipemia, Hypertension, Pneumonia, and Shortness of breath dyspnea.  PAST SURGICAL HISTORY: She  has a past surgical history that includes  Cardiac surgery; Abdominal hysterectomy; Stomach surgery; Hip surgery; Cardiac catheterization (Left, 10/21/2015); Cardiac catheterization (N/A, 10/21/2015); Transurethral resection of bladder tumor (N/A, 01/20/2016); Cystoscopy w/ retrogrades (Bilateral, 01/20/2016); Cystoscopy with stent placement (Right, 01/20/2016); and Tracheostomy tube placement (N/A, 03/02/2017).  No Known Allergies  No current facility-administered medications on file prior to encounter.    Current Outpatient Medications on File Prior to Encounter  Medication Sig  . acetaminophen (TYLENOL) 325 MG tablet Take 2 tablets (650 mg total) by mouth every 6 (six) hours as needed for mild pain (or Fever >/= 101).  . Amino Acids-Protein Hydrolys (FEEDING SUPPLEMENT, PRO-STAT SUGAR FREE 64,) LIQD Place 30 mLs into feeding tube 2 (two) times daily. (Patient taking differently: Take 30 mLs by mouth 3 (three) times daily. )  . amLODipine (NORVASC) 10 MG tablet Take 10 mg by mouth daily.  Marland Kitchen aspirin EC 81 MG tablet Take 81 mg by mouth daily.  Marland Kitchen atorvastatin (LIPITOR) 10 MG tablet Take 10 mg by mouth at bedtime.  . carvedilol (COREG) 6.25 MG tablet Take 6.25 mg by mouth 2 (two) times daily.  Marland Kitchen levETIRAcetam (KEPPRA) 750 MG tablet Take 750 mg by mouth 2 (two) times daily.  . Melatonin 3 MG TABS Take 3 mg by mouth at bedtime.  . Multiple Vitamin (THEREMS) TABS Take 1 tablet by mouth daily.  . sertraline (ZOLOFT) 25 MG tablet Take 25 mg by mouth daily.  . tamsulosin (FLOMAX) 0.4 MG CAPS capsule Take 0.4 mg by mouth daily.  . Valproate Sodium (DEPAKENE) 250 MG/5ML SOLN solution Take 500 mg by mouth 2 (two) times daily.  Marland Kitchen acetaminophen (TYLENOL) 650 MG suppository Place 1 suppository (650 mg total) rectally every 6 (six) hours  as needed for mild pain (or Fever >/= 101). (Patient not taking: Reported on 06/26/2017)  . acyclovir 650 mg in dextrose 5 % 100 mL Inject 650 mg into the vein daily. (Patient not taking: Reported on 06/26/2017)  . albuterol  (PROVENTIL) (2.5 MG/3ML) 0.083% nebulizer solution Take 3 mLs (2.5 mg total) by nebulization every 2 (two) hours as needed for wheezing. (Patient not taking: Reported on 06/26/2017)  . chlorhexidine gluconate, MEDLINE KIT, (PERIDEX) 0.12 % solution 15 mLs by Mouth Rinse route 2 (two) times daily. (Patient not taking: Reported on 06/26/2017)  . hydrALAZINE (APRESOLINE) 20 MG/ML injection Inject 0.5 mLs (10 mg total) into the vein every 6 (six) hours as needed (SBP>180 or DBP>100). (Patient not taking: Reported on 06/26/2017)  . levETIRAcetam 500 mg in sodium chloride 0.9 % 100 mL Inject 500 mg into the vein every 12 (twelve) hours. (Patient not taking: Reported on 06/26/2017)  . meropenem 1 g in sodium chloride 0.9 % 100 mL Inject 1 g into the vein every 12 (twelve) hours. (Patient not taking: Reported on 06/26/2017)  . metoprolol tartrate (LOPRESSOR) 5 MG/5ML SOLN injection Inject 2.5-5 mLs (2.5-5 mg total) into the vein every 6 (six) hours as needed (for heart rate >120). (Patient not taking: Reported on 06/26/2017)  . mouth rinse LIQD solution 15 mLs by Mouth Rinse route as needed. (Patient not taking: Reported on 06/26/2017)  . Multiple Vitamin (MULTIVITAMIN) LIQD Place 15 mLs into feeding tube daily. (Patient not taking: Reported on 06/26/2017)  . Nutritional Supplements (FEEDING SUPPLEMENT, VITAL 1.5 CAL,) LIQD Place 1,000 mLs into feeding tube daily. (Patient not taking: Reported on 06/26/2017)  . ondansetron (ZOFRAN) 4 MG/2ML SOLN injection Inject 2 mLs (4 mg total) into the vein every 6 (six) hours as needed for nausea. (Patient not taking: Reported on 06/26/2017)  . pantoprazole sodium (PROTONIX) 40 mg/20 mL PACK Place 20 mLs (40 mg total) into feeding tube daily at 12 noon. (Patient not taking: Reported on 06/26/2017)  . senna-docusate (SENOKOT-S) 8.6-50 MG tablet Place 1 tablet into feeding tube 2 (two) times daily. (Patient not taking: Reported on 06/26/2017)  . valproate 1,000 mg in dextrose 5 % 50 mL  Inject 1,000 mg into the vein every 8 (eight) hours. (Patient not taking: Reported on 06/26/2017)  . Water For Irrigation, Sterile (FREE WATER) SOLN Place 100 mLs into feeding tube every 8 (eight) hours. (Patient not taking: Reported on 06/26/2017)    FAMILY HISTORY:  Her indicated that her mother is deceased. She indicated that her father is deceased. She indicated that the status of her neg hx is unknown.   SOCIAL HISTORY: She  reports that she quit smoking about 19 years ago. She has a 40.00 pack-year smoking history. she has never used smokeless tobacco. She reports that she does not drink alcohol or use drugs.  REVIEW OF SYSTEMS:   Not obtainable  SUBJECTIVE:  As above  VITAL SIGNS: BP (!) 141/88   Pulse (!) 103   Temp 98.5 F (36.9 C) (Axillary)   Resp 20   Ht '5\' 4"'$  (1.626 m)   Wt 173 lb 15.1 oz (78.9 kg)   SpO2 99%   BMI 29.86 kg/m   HEMODYNAMICS:    VENTILATOR SETTINGS:    INTAKE / OUTPUT: I/O last 3 completed shifts: In: 1948 [I.V.:1378; IV Piggyback:570] Out: 2450 [Urine:2450]  PHYSICAL EXAMINATION: General: Chronically ill-appearing elderly female, who is extubated and in no distress.  Neuro: She is remarkably oriented x3 and appropriately interactive.  Pupils  are equal and she moves all fours on request.  She appears to have an intention tremor.    Cardiovascular: S1 and S2 are irregularly irregular without murmur rub or gallop  Lungs: Respirations are unlabored,  there is symmetric air movement, few scattered rhonchi, no wheezes.  scattered wheezes.   Abdomen: The abdomen is obese, soft without any organomegaly masses tenderness 1+ anasarca LABS:  BMET Recent Labs  Lab 07/01/17 0300 07/02/17 0300 07/03/17 0305  NA 144 145 145  K 3.7 3.6 3.2*  CL 105 104 104  CO2 '27 30 31  '$ BUN '15 14 18  '$ CREATININE 1.18* 0.50 0.51  GLUCOSE 104* 119* 108*    Electrolytes Recent Labs  Lab 06/29/17 1707 06/30/17 0338 06/30/17 1616 07/01/17 0300  07/02/17 0300 07/03/17 0305  CALCIUM  --  9.1  --  9.8 10.0 9.8  MG 1.9 1.9 1.9  --   --   --   PHOS 2.3* 2.5 2.4*  --   --   --     CBC Recent Labs  Lab 06/29/17 0443 06/30/17 0338  WBC 5.3 5.2  HGB 9.0* 8.9*  HCT 30.4* 30.0*  PLT 193 195    Coag's No results for input(s): APTT, INR in the last 168 hours.  Sepsis Markers Recent Labs  Lab 06/27/17 0232  PROCALCITON <0.10    ABG Recent Labs  Lab 06/29/17 0345  PHART 7.443  PCO2ART 37.0  PO2ART 133*    Liver Enzymes Recent Labs  Lab 06/29/17 0443  AST 16  ALT 13*  ALKPHOS 66  BILITOT 0.3  ALBUMIN 2.1*    Cardiac Enzymes No results for input(s): TROPONINI, PROBNP in the last 168 hours.  Glucose Recent Labs  Lab 07/02/17 0738 07/02/17 1158 07/02/17 1552 07/02/17 2000 07/03/17 0009 07/03/17 0504  GLUCAP 116* 132* 132* 112* 95 105*    Imaging No results found.    CULTURES: Urine obtained at [place] is growing greater than 10 to the fifth gram-negative rods an ESBL producing E. coli  ANTIBIOTICS: Meropenem started on 2/17 followed by Fosfomycin then D/C'd  DISCUSSION:      This is an 82 year old with a prior history of ESBL producing gram-negative UTI and a known right convexity meningioma who was transferred to this hospital after prolonged seizure.  She has had several episodes of clinical seizures with subsequent postictal states since admission.  She was on meropenem for an ESBL producing UTI but we discovered that meropenem greatly accelerates the metabolism of valproic acid so the meropenem has been substituted.    ASSESSMENT / PLAN:  PULMONARY A:   Extubation has been well-tolerated.  She has no indication of aspiration nor does she have any difficulty in protecting her airway.    CARDIOVASCULAR A: Now appears to be in atrial fibrillation.  I added a small dose of Lopressor for rate control.    Echo shows mild LV systolic dysfunction, I am diuresing the patient, I have added a small  dose of ACE inhibitor today.    I am hesitant to add anticoagulation until it is clear that her seizures are controlled.  GASTROINTESTINAL A: Prophylaxis is with Protonix  INFECTIOUS A: She received 1 dose of  fosfomycin for ESBL producing E. coli in the urine  NEUROLOGIC A: Mental status is improved, suspect a resolving  Post ictal state. Adjustment of AED's per Neurology.     Lars Masson, MD Pulmonary and [place] Pager: [phone] 07/03/2017, 8:19 AM

## 2017-07-03 NOTE — Progress Notes (Signed)
S:// Seen and examined. No sz overnight. More awake/alert  O: Vitals:   07/03/17 0600 07/03/17 0700  BP: (!) 172/95 (!) 141/88  Pulse: (!) 103   Resp: (!) 22 20  Temp:    SpO2: 99%   UNCHANGED Awake alert oriented in no apparent distress S1-S2 heard regular rate rhythm Chest clear to auscultation Abdomen nondistended nontender Neurological exam next line patient awake alert oriented x3 Her speech is mildly dysarthric Naming comprehension and repetition are intact Cranial nerves: Pupils equal round reactive light, extra ocular movements intact, visual fields full, facial symmetry is maintained, facial sensation normal bilaterally, palate elevates midline, tongue midline. Motor exam: Able to lift both upper extremity is antigravity. Wiggles toes to commands but not able to lift left leg antigravity. Able to lift right leg for a very short duration of time antigravity Sensory exam: Appreciated light touch all over without any focal deficits Coronation: Not performed Gait exam: Deferred due to patient's medical condition   Current Facility-Administered Medications:  .  0.9 %  sodium chloride infusion, 250 mL, Intravenous, PRN, Omar Person, NP, Last Rate: 10 mL/hr at 07/03/17 0700, 250 mL at 07/03/17 0700 .  acetaminophen (TYLENOL) solution 650 mg, 650 mg, Oral, Q6H PRN, Omar Person, NP, 650 mg at 07/03/17 0603 .  acetaminophen (TYLENOL) suppository 650 mg, 650 mg, Rectal, Q4H PRN, Sharia Reeve, MD, 650 mg at 07/01/17 0052 .  albuterol (PROVENTIL) (2.5 MG/3ML) 0.083% nebulizer solution 2.5 mg, 2.5 mg, Nebulization, Q3H PRN, Deterding, Guadelupe Sabin, MD, 2.5 mg at 07/03/17 0202 .  amLODipine (NORVASC) tablet 10 mg, 10 mg, Oral, Daily, Panchal, Amar, MD, 10 mg at 07/02/17 0926 .  chlorhexidine gluconate (MEDLINE KIT) (PERIDEX) 0.12 % solution 15 mL, 15 mL, Mouth Rinse, BID, Amie Portland, MD, 15 mL at 07/03/17 2774 .  Chlorhexidine Gluconate Cloth 2 % PADS 6 each, 6 each,  Topical, Q0600, Sampson Goon, MD, 6 each at 07/02/17 1224 .  dextrose 5% in lactated ringers with KCl 20 mEq/L infusion, , Intravenous, Continuous, Sampson Goon, MD, Last Rate: 30 mL/hr at 07/03/17 0948 .  feeding supplement (PRO-STAT SUGAR FREE 64) liquid 30 mL, 30 mL, Per Tube, BID, Sampson Goon, MD, Stopped at 07/01/17 1203 .  feeding supplement (VITAL AF 1.2 CAL) liquid 1,000 mL, 1,000 mL, Per Tube, Continuous, Sampson Goon, MD, Stopped at 06/30/17 [phone] .  furosemide (LASIX) injection 20 mg, 20 mg, Intravenous, Daily, Sampson Goon, MD, 20 mg at 07/03/17 0949 .  heparin injection 5,000 Units, 5,000 Units, Subcutaneous, Q8H, Omar Person, NP, 5,000 Units at 07/03/17 0516 .  hydrALAZINE (APRESOLINE) injection 5 mg, 5 mg, Intravenous, Q4H PRN, Sampson Goon, MD, 5 mg at 07/03/17 8676 .  ipratropium (ATROVENT) nebulizer solution 0.5 mg, 0.5 mg, Nebulization, Q6H, Panchal, Amar, MD, 0.5 mg at 07/03/17 0901 .  levETIRAcetam (KEPPRA) 1,000 mg in sodium chloride 0.9 % 100 mL IVPB, 1,000 mg, Intravenous, Q12H, Scatliffe, Rise Paganini, MD, Stopped at 07/03/17 0320 .  lisinopril (PRINIVIL,ZESTRIL) tablet 2.5 mg, 2.5 mg, Per Tube, Daily, Sampson Goon, MD, 2.5 mg at 07/02/17 0926 .  LORazepam (ATIVAN) injection 2 mg, 2 mg, Intravenous, PRN, Sampson Goon, MD, 2 mg at 06/29/17 1025 .  MEDLINE mouth rinse, 15 mL, Mouth Rinse, BID, Sampson Goon, MD, 15 mL at 07/02/17 2147 .  metoprolol tartrate (LOPRESSOR) injection 2.5 mg, 2.5 mg, Intravenous, Q6H, Sampson Goon, MD, 2.5 mg at 07/03/17 0517 .  midazolam (VERSED)  50 mg in sodium chloride 0.9 % 50 mL (1 mg/mL) infusion, 0.5-8 mg/hr, Intravenous, Titrated, Oletta Darter Virgina Evener, MD, Stopped at 06/27/17 [phone] .  pantoprazole (PROTONIX) injection 40 mg, 40 mg, Intravenous, Daily, Hayden Pedro M, NP, 40 mg at 07/02/17 2229 .  potassium chloride 10 mEq in 100 mL IVPB, 10 mEq, Intravenous, Q1 Hr x 3, Sampson Goon, MD, Last Rate: 100 mL/hr at  07/03/17 0949, 10 mEq at 07/03/17 0949 .  RESOURCE THICKENUP CLEAR, , Oral, PRN, Sampson Goon, MD .  sodium chloride flush (NS) 0.9 % injection 10-40 mL, 10-40 mL, Intracatheter, Q12H, Sampson Goon, MD, 10 mL at 07/03/17 0950 .  sodium chloride flush (NS) 0.9 % injection 10-40 mL, 10-40 mL, Intracatheter, PRN, Sampson Goon, MD .  valproate (DEPACON) 1,000 mg in dextrose 5 % 50 mL IVPB, 1,000 mg, Intravenous, Q8H, Omar Person, NP, Last Rate: 60 mL/hr at 07/03/17 0513, 1,000 mg at 07/03/17 0513  Labs No new levels CBC    Component Value Date/Time   WBC 5.2 06/30/2017 0338   RBC 3.46 (L) 06/30/2017 0338   HGB 8.9 (L) 06/30/2017 0338   HGB 13.1 01/23/2014 1125   HCT 30.0 (L) 06/30/2017 0338   HCT 41.3 01/23/2014 1125   PLT 195 06/30/2017 0338   PLT 244 01/23/2014 1125   MCV 86.7 06/30/2017 0338   MCV 91 01/23/2014 1125   MCH 25.7 (L) 06/30/2017 0338   MCHC 29.7 (L) 06/30/2017 0338   RDW 13.9 06/30/2017 0338   RDW 13.3 01/23/2014 1125   LYMPHSABS 2.1 06/30/2017 0338   LYMPHSABS 1.1 01/23/2014 1125   MONOABS 0.6 06/30/2017 0338   MONOABS 0.5 01/23/2014 1125   EOSABS 0.4 06/30/2017 0338   EOSABS 0.1 01/23/2014 1125   BASOSABS 0.0 06/30/2017 0338   BASOSABS 0.0 01/23/2014 1125   CMP     Component Value Date/Time   NA 145 07/03/2017 0305   NA 141 08/26/2015 1131   NA 131 (L) 01/23/2014 1125   K 3.2 (L) 07/03/2017 0305   K 3.9 01/23/2014 1125   CL 104 07/03/2017 0305   CL 94 (L) 01/23/2014 1125   CO2 31 07/03/2017 0305   CO2 32 01/23/2014 1125   GLUCOSE 108 (H) 07/03/2017 0305   GLUCOSE 91 01/23/2014 1125   BUN 18 07/03/2017 0305   BUN 16 08/26/2015 1131   BUN 11 01/23/2014 1125   CREATININE 0.51 07/03/2017 0305   CREATININE 0.72 01/23/2014 1125   CALCIUM 9.8 07/03/2017 0305   CALCIUM 9.4 01/23/2014 1125   PROT 4.6 (L) 06/29/2017 0443   PROT 7.0 01/23/2014 1125   ALBUMIN 2.1 (L) 06/29/2017 0443   ALBUMIN 3.4 01/23/2014 1125   AST 16 06/29/2017 0443    AST 11 (L) 01/23/2014 1125   ALT 13 (L) 06/29/2017 0443   ALT 16 01/23/2014 1125   ALKPHOS 66 06/29/2017 0443   ALKPHOS 123 (H) 01/23/2014 1125   BILITOT 0.3 06/29/2017 0443   BILITOT 0.4 01/23/2014 1125   GFRNONAA >60 07/03/2017 0305   GFRNONAA >60 01/23/2014 1125   GFRAA >60 07/03/2017 0305   GFRAA >60 01/23/2014 1125    Assessment Breakthrough seizure secondary to subtherapeutic antiepileptic levels in the setting of urosepsis. She is now seizure-free for at least 2-3 days.  Her examination is back to baseline. The Depakote levels were hard to make therapeutic due to interaction with meropenem.  Meropenem has been discontinued now for 2-3 days. Her valproate levels are trending up but  still not therapeutic.  Recs: -I would recommend not checking a Depakote level for the next 1 or 2 days. - I would recommend to repeat a Depakote level on Wednesday-goal levels between 50-100 -Keep her on the current dose of Keppra and Depakote-the current dose of Depakote is a very high dose and might need adjustment if the levels on Wednesday are higher than 100.  I would imagine that she would require no more than 500 mg twice daily of Depakote as a regular dosage.  Appreciate pharmacy assistance in helping with Depakote dosing once Depakote attained steady state levels. -Also check ammonia levels on Wednesday -Maintain seizure precautions -Medical management of toxic metabolic derangements as you are.    Neurology will be available as needed.  Please call with questions

## 2017-07-04 DIAGNOSIS — B9620 Unspecified Escherichia coli [E. coli] as the cause of diseases classified elsewhere: Secondary | ICD-10-CM

## 2017-07-04 DIAGNOSIS — I480 Paroxysmal atrial fibrillation: Secondary | ICD-10-CM

## 2017-07-04 DIAGNOSIS — N390 Urinary tract infection, site not specified: Secondary | ICD-10-CM

## 2017-07-04 LAB — GLUCOSE, CAPILLARY
GLUCOSE-CAPILLARY: 116 mg/dL — AB (ref 65–99)
GLUCOSE-CAPILLARY: 98 mg/dL (ref 65–99)
Glucose-Capillary: 117 mg/dL — ABNORMAL HIGH (ref 65–99)
Glucose-Capillary: 123 mg/dL — ABNORMAL HIGH (ref 65–99)
Glucose-Capillary: 135 mg/dL — ABNORMAL HIGH (ref 65–99)
Glucose-Capillary: 97 mg/dL (ref 65–99)

## 2017-07-04 MED ORDER — IPRATROPIUM BROMIDE 0.02 % IN SOLN
0.5000 mg | Freq: Three times a day (TID) | RESPIRATORY_TRACT | Status: DC
  Administered 2017-07-05 – 2017-07-08 (×10): 0.5 mg via RESPIRATORY_TRACT
  Filled 2017-07-04 (×10): qty 2.5

## 2017-07-04 MED ORDER — CHLORHEXIDINE GLUCONATE CLOTH 2 % EX PADS
6.0000 | MEDICATED_PAD | Freq: Every day | CUTANEOUS | Status: DC
  Administered 2017-07-04 – 2017-07-07 (×3): 6 via TOPICAL

## 2017-07-04 NOTE — Progress Notes (Signed)
Patient admitted to 3w27. Family at bedside. No acute distress. VSS. Oriented to room. Call light provided. No c/o pain or discomfort. Will monitor.

## 2017-07-04 NOTE — Progress Notes (Signed)
PROGRESS NOTE  Melissa Cochran YQI:[id number] DOB: 07-Sep-1935 DOA: 06/26/2017 PCP: Teodoro Spray, MD   LOS: 8 days   Brief Narrative / Interim history: 82 yo female with history of hypertension, COPD, hyperlipidemia, coronary artery disease, seizure disorder, CHF was brought to the hospital after a seizure activity for about 45 minutes, intubated in the field and admitted to the ICU.  She recently had a complicated hospitalization in October 2018 again in the setting of seizure disorder requiring intubation, tracheostomy and PEG placement and [facility]  She improved and was decannulated.  She was in rehab when this episode happened. She was extubated on 2/21, she is tolerating this.  No further seizure episodes.  Neurology is following.  Assessment & Plan: Principal Problem:   Seizures ([place]) Active Problems:   E-coli UTI, ESBL    A-fib (HCC)   Need for protective airway ventilation   Seizure disorder  -neurology following, appreciate input. Currently patient is on Depakote 1 g Q8H and Keppra 1g BID -valproic acid level still subtherapeutic on 2/23 at 35. This is due to her being on Meropenem which significantly increases Depakote clearance -per neurology recommendations are to recheck a valproic level again on Wednesday, goal 50-100. Will discuss with pharmacy once level is back regarding final home dose  ESBL UTI -treated with Meropenem 2/17 to 2/20 and fosfomycin x 1 on 2/20 -no further signs of infection  Paroxysmal A fib -rate controlled with Metoprolol. If she has consistent po intake will switch to po  -will need anticoagulation once  Acute encephalopathy -due to seizures/postictal states, now improved  HTN -continue Norvasc, Lasix, Lisinopril  Acute hypoxic respiratory failure -with inability to protect her airway, intubated given seizure episodes -now extubated, on [place], comfortable, wean off oxygen as tolerated.   Acute on  chronic mild systolic CHF -2D echo with EF 45-50%, in A fib -CXR 2/19 with slight fluid overload  -betablockers, IV Lasix will need cards eval likely in outpatient setting once acute illness improved.    DVT prophylaxis: heparin s.q. Code Status: Full code Family Communication: no family at bedside Disposition Plan: home vs SNF when ready   Consultants:   PCCM  Neurology   Procedures:   2D echo: Study Conclusions - Left ventricle: The cavity size was mildly dilated. Systolic function was mildly reduced. The estimated ejection fraction was in the range of 45% to 50%. Diffuse hypokinesis. The study is not technically sufficient to allow evaluation of LV diastolic function. - Aortic valve: Transvalvular velocity was within the normal range. There was no stenosis. There was no regurgitation. Mitral valve: Calcified annulus. Transvalvular velocity was within the normal range. There was no evidence for stenosis. There was mild to moderate regurgitation. - Left atrium: The atrium was severely dilated. - Right ventricle: The cavity size was normal. Wall thickness was normal. Systolic function was normal. - Atrial septum: No defect or patent foramen ovale was identified. - Tricuspid valve: There was moderate regurgitation. - Pulmonary arteries: Systolic pressure was moderately increased. PA peak pressure: 56 mm Hg (S).  Antimicrobials:  Meropenem 2/17 >> 2/20   Fosfomycin 2/20 x 1 dose  Subjective: -has pain all over. Denies shortness of breath   Objective: Vitals:   07/04/17 0825 07/04/17 0900 07/04/17 1000 07/04/17 1144  BP: (!) 153/80 (!) 148/76 (!) 154/81   Pulse: (!) 106 (!) 117 (!) 112   Resp: (!) 25 (!) 24 (!) 24   Temp:    99.5 F (  37.5 C)  TempSrc:    Axillary  SpO2: 94% 92% 95%   Weight:      Height:        Intake/Output Summary (Last 24 hours) at 07/04/2017 1148 Last data filed at 07/04/2017 1144 Gross per 24 hour  Intake 1430 ml  Output 1300 ml  Net 130 ml    Filed Weights   07/02/17 0336 07/03/17 0400 07/04/17 0448  Weight: 79 kg (174 lb 2.6 oz) 78.9 kg (173 lb 15.1 oz) 80 kg (176 lb 5.9 oz)    Examination:  Constitutional: NAD Eyes:  lids and conjunctivae normal ENMT: Mucous membranes are moist.  Neck: normal, supple Respiratory: clear to auscultation bilaterally, no wheezing, no crackles. Cardiovascular: irregular, no murmurs / rubs / gallops. Trace LE edema. Abdomen: no tenderness. Bowel sounds positive.  Musculoskeletal: no clubbing / cyanosis.  Skin: no rashes Neurologic: non focal    Data Reviewed: I have independently reviewed following labs and imaging studies   CBC: Recent Labs  Lab 06/29/17 0443 06/30/17 0338  WBC 5.3 5.2  NEUTROABS 3.4 2.1  HGB 9.0* 8.9*  HCT 30.4* 30.0*  MCV 85.9 86.7  PLT 193 833   Basic Metabolic Panel: Recent Labs  Lab 06/28/17 0915 06/29/17 0443 06/29/17 1707 06/30/17 0338 06/30/17 1616 07/01/17 0300 07/02/17 0300 07/03/17 0305  NA 143 144  --  143  --  144 145 145  K 4.5 3.3*  --  3.6  --  3.7 3.6 3.2*  CL 109 111  --  109  --  105 104 104  CO2 24 24  --  26  --  27 30 31   GLUCOSE 127* 111*  --  95  --  104* 119* 108*  BUN 21* 21*  --  21*  --  15 14 18   CREATININE 0.53 0.46  --  0.42*  --  1.18* 0.50 0.51  CALCIUM 8.9 8.8*  --  9.1  --  9.8 10.0 9.8  MG 2.0 1.8 1.9 1.9 1.9  --   --   --   PHOS 3.0 2.5 2.3* 2.5 2.4*  --   --   --    GFR: Estimated Creatinine Clearance: 57.4 mL/min (by C-G formula based on SCr of 0.51 mg/dL). Liver Function Tests: Recent Labs  Lab 06/29/17 0443  AST 16  ALT 13*  ALKPHOS 66  BILITOT 0.3  PROT 4.6*  ALBUMIN 2.1*   No results for input(s): LIPASE, AMYLASE in the last 168 hours. Recent Labs  Lab 06/29/17 0937  AMMONIA 30   Coagulation Profile: No results for input(s): INR, PROTIME in the last 168 hours. Cardiac Enzymes: No results for input(s): CKTOTAL, CKMB, CKMBINDEX, TROPONINI in the last 168 hours. BNP (last 3 results) No  results for input(s): PROBNP in the last 8760 hours. HbA1C: No results for input(s): HGBA1C in the last 72 hours. CBG: Recent Labs  Lab 07/03/17 0504 07/03/17 0834 07/03/17 1316 07/03/17 1956 07/04/17 0736  GLUCAP 105* 110* 116* 103* 117*   Lipid Profile: No results for input(s): CHOL, HDL, LDLCALC, TRIG, CHOLHDL, LDLDIRECT in the last 72 hours. Thyroid Function Tests: No results for input(s): TSH, T4TOTAL, FREET4, T3FREE, THYROIDAB in the last 72 hours. Anemia Panel: No results for input(s): VITAMINB12, FOLATE, FERRITIN, TIBC, IRON, RETICCTPCT in the last 72 hours. Urine analysis:    Component Value Date/Time   COLORURINE YELLOW (A) 06/25/2017 2052   APPEARANCEUR HAZY (A) 06/25/2017 2052   APPEARANCEUR Cloudy (A) 01/29/2016 1126   LABSPEC  1.017 06/25/2017 2052   LABSPEC 1.020 01/23/2014 1047   PHURINE 5.0 06/25/2017 2052   GLUCOSEU NEGATIVE 06/25/2017 2052   GLUCOSEU NEGATIVE 01/23/2014 1047   HGBUR MODERATE (A) 06/25/2017 2052   BILIRUBINUR NEGATIVE 06/25/2017 2052   BILIRUBINUR Negative 01/13/2016 [place] 01/23/2014 [place] 06/25/2017 2052   PROTEINUR NEGATIVE 06/25/2017 2052   UROBILINOGEN 0.2 08/04/2015 1140   NITRITE POSITIVE (A) 06/25/2017 2052   LEUKOCYTESUR SMALL (A) 06/25/2017 2052   LEUKOCYTESUR 3+ (A) 01/13/2016 1110   LEUKOCYTESUR TRACE 01/23/2014 1047   Sepsis Labs: Invalid input(s): PROCALCITONIN, LACTICIDVEN  Recent Results (from the past 240 hour(s))  Blood Culture (routine x 2)     Status: None   Collection Time: 06/25/17  8:52 PM  Result Value Ref Range Status   Specimen Description BLOOD LEFT HAND  Final   Special Requests   Final    BOTTLES DRAWN AEROBIC AND ANAEROBIC Blood Culture adequate volume   Culture   Final    NO GROWTH 5 DAYS Performed at [facility], [address] [place], [place] [address]    Report Status 06/30/2017 FINAL  Final  Blood Culture (routine x 2)     Status: None    Collection Time: 06/25/17  8:52 PM  Result Value Ref Range Status   Specimen Description BLOOD LEFT FOREARM  Final   Special Requests   Final    BOTTLES DRAWN AEROBIC AND ANAEROBIC Blood Culture adequate volume   Culture   Final    NO GROWTH 5 DAYS Performed at [facility], [address] [place], [place] [address]    Report Status 06/30/2017 FINAL  Final  Urine culture     Status: Abnormal   Collection Time: 06/25/17  8:52 PM  Result Value Ref Range Status   Specimen Description   Final    URINE, RANDOM Performed at [facility], [address] [place], [place] [address]    Special Requests   Final    NONE Performed at [facility], [address] [place], [place] [address]    Culture (A)  Final    >=100,000 COLONIES/mL ESCHERICHIA COLI Confirmed Extended Spectrum Beta-Lactamase Producer (ESBL).  In bloodstream infections from ESBL organisms, carbapenems are preferred over piperacillin/tazobactam. They are shown to have a lower risk of mortality. Performed at [facility], [place] [address], [place], [place] [address]    Report Status 06/28/2017 FINAL  Final   Organism ID, Bacteria ESCHERICHIA COLI (A)  Final      Susceptibility   Escherichia coli - MIC*    AMPICILLIN >=32 RESISTANT Resistant     CEFAZOLIN >=64 RESISTANT Resistant     CEFTRIAXONE RESISTANT Resistant     CIPROFLOXACIN >=4 RESISTANT Resistant     GENTAMICIN <=1 SENSITIVE Sensitive     IMIPENEM <=0.25 SENSITIVE Sensitive     NITROFURANTOIN <=16 SENSITIVE Sensitive     TRIMETH/SULFA >=320 RESISTANT Resistant     AMPICILLIN/SULBACTAM >=32 RESISTANT Resistant     PIP/TAZO <=4 SENSITIVE Sensitive     Extended ESBL POSITIVE Resistant     * >=100,000 COLONIES/mL ESCHERICHIA COLI  CSF culture     Status: None   Collection Time: 06/25/17  9:43 PM  Result Value Ref Range Status   Specimen Description   Final    CSF Performed at [facility], [address]  [place], [place] [address]    Special Requests   Final  NONE Performed at [facility], [address] [place], [place] [address]    Gram Stain   Final    NO ORGANISMS SEEN RARE WBC SEEN NO RBCS SEEN  Performed at [facility], [address] [place], [place] [address]    Culture   Final    NO GROWTH 3 DAYS Performed at [facility], [place] [address] [place], [place] [address]    Report Status 06/29/2017 FINAL  Final  MRSA PCR Screening     Status: None   Collection Time: 06/26/17  1:26 AM  Result Value Ref Range Status   MRSA by PCR NEGATIVE NEGATIVE Final    Comment:        The GeneXpert MRSA Assay (FDA approved for NASAL specimens only), is one component of a comprehensive MRSA colonization surveillance program. It is not intended to diagnose MRSA infection nor to guide or monitor treatment for MRSA infections. Performed at [facility], [place] [address], [place], [place] [address]   Respiratory Panel by PCR     Status: None   Collection Time: 06/26/17  1:26 AM  Result Value Ref Range Status   Adenovirus NOT DETECTED NOT DETECTED Final   Coronavirus 229E NOT DETECTED NOT DETECTED Final   Coronavirus HKU1 NOT DETECTED NOT DETECTED Final   Coronavirus NL63 NOT DETECTED NOT DETECTED Final   Coronavirus OC43 NOT DETECTED NOT DETECTED Final   Metapneumovirus NOT DETECTED NOT DETECTED Final   Rhinovirus / Enterovirus NOT DETECTED NOT DETECTED Final   Influenza A NOT DETECTED NOT DETECTED Final   Influenza B NOT DETECTED NOT DETECTED Final   Parainfluenza Virus 1 NOT DETECTED NOT DETECTED Final   Parainfluenza Virus 2 NOT DETECTED NOT DETECTED Final   Parainfluenza Virus 3 NOT DETECTED NOT DETECTED Final   Parainfluenza Virus 4 NOT DETECTED NOT DETECTED Final   Respiratory Syncytial Virus NOT DETECTED NOT DETECTED Final   Bordetella pertussis NOT DETECTED NOT DETECTED Final   Chlamydophila pneumoniae NOT DETECTED NOT DETECTED Final    Mycoplasma pneumoniae NOT DETECTED NOT DETECTED Final    Comment: Performed at [facility], [place] [address], [place], [place] [address]  Culture, respiratory (NON-Expectorated)     Status: None   Collection Time: 06/26/17  1:26 AM  Result Value Ref Range Status   Specimen Description TRACHEAL ASPIRATE  Final   Special Requests NONE  Final   Gram Stain   Final    RARE WBC PRESENT, PREDOMINANTLY MONONUCLEAR FEW GRAM POSITIVE RODS FEW SQUAMOUS EPITHELIAL CELLS PRESENT Performed at [facility], [place] [address] [place], [place] [address]    Culture MODERATE PSEUDOMONAS AERUGINOSA  Final   Report Status 06/28/2017 FINAL  Final   Organism ID, Bacteria PSEUDOMONAS AERUGINOSA  Final      Susceptibility   Pseudomonas aeruginosa - MIC*    CEFTAZIDIME 2 SENSITIVE Sensitive     CIPROFLOXACIN 2 INTERMEDIATE Intermediate     GENTAMICIN 2 SENSITIVE Sensitive     IMIPENEM 2 SENSITIVE Sensitive     PIP/TAZO <=4 SENSITIVE Sensitive     CEFEPIME 4 SENSITIVE Sensitive     * MODERATE PSEUDOMONAS AERUGINOSA  Culture, blood (routine x 2)     Status: None (Preliminary result)   Collection Time: 07/01/17  2:43 AM  Result Value Ref Range Status   Specimen Description BLOOD LEFT HAND  Final   Special Requests IN PEDIATRIC BOTTLE Blood Culture adequate volume  Final   Culture  Final    NO GROWTH 2 DAYS Performed at [facility], [place] [address], [place], [place] [address]    Report Status PENDING  Incomplete  Culture, blood (routine x 2)     Status: None (Preliminary result)   Collection Time: 07/01/17  2:50 AM  Result Value Ref Range Status   Specimen Description BLOOD RIGHT HAND  Final   Special Requests IN PEDIATRIC BOTTLE Blood Culture adequate volume  Final   Culture   Final    NO GROWTH 2 DAYS Performed at [facility], [place] [address], [place], [place] [address]    Report Status PENDING  Incomplete      Radiology Studies: No results found.   Scheduled Meds: .  amLODipine  10 mg Oral Daily  . furosemide  20 mg Intravenous Daily  . heparin  5,000 Units Subcutaneous Q8H  . ipratropium  0.5 mg Nebulization Q6H  . lisinopril  2.5 mg Per Tube Daily  . metoprolol tartrate  2.5 mg Intravenous Q6H  . pantoprazole sodium  40 mg Per Tube Daily   Continuous Infusions: . dextrose 5% lactated ringers with KCl 20 mEq/L 30 mL/hr at 07/04/17 1000  . levETIRAcetam Stopped (07/04/17 0315)  . valproate sodium Stopped (07/04/17 0933)    Marzetta Board, MD, PhD [facility] Pager [phone]  If 7PM-7AM, please contact night-coverage [email or web address] Password [facility] 07/04/2017, 11:48 AM

## 2017-07-04 NOTE — Progress Notes (Addendum)
Nutrition Follow-up  INTERVENTION:   Magic cup TID with meals, each supplement provides 290 kcal and 9 grams of protein  Mighty Shake II TID with meals, each supplement provides 480-500 kcals and 20-23 grams of protein   NUTRITION DIAGNOSIS:   Inadequate oral intake related to decreased appetite as evidenced by meal completion < 50%. Ongoing.   GOAL:   Patient will meet greater than or equal to 90% of their needs Progressing.   MONITOR:   PO intake, Supplement acceptance, Diet advancement  REASON FOR ASSESSMENT:   Consult, Ventilator Enteral/tube feeding initiation and management  ASSESSMENT:   Pt with PMH of HTN, CHF, COPD, HLD, CAD, Sz, meningioma, recent hospitalization 10/7-10/16 for sz. Pt ultimately had trach went to [facility] and was decannulated, went to rehab was able feed herself but was weak and wheelchair bound. Pt admitted 2/16 with seizure activity for 45 min. Pt intubated.   Pt discussed during ICU rounds and with RN.  2/21 extubated 2/23 Diet advanced to dysphagia 1 with Nectar thick liquids  Per nurse tech pt is eating applesauce and pudding but did not eat any of the foods on her plate for breakfast. Pt states he is not hungry and does not have much of an appetite. She does not like ensure.  Neurology following for sz medication adjustments  Diet Order:  DIET - DYS 1 Room service appropriate? Yes; Fluid consistency: Nectar Thick  EDUCATION NEEDS:   No education needs have been identified at this time  Skin:  Skin Assessment: (left leg non-pressure wound)  Last BM:  2/21  Height:   Ht Readings from Last 1 Encounters:  06/27/17 5\' 4"  (1.626 m)    Weight:   Wt Readings from Last 1 Encounters:  07/04/17 176 lb 5.9 oz (80 kg)    Ideal Body Weight:  54.5 kg  BMI:  Body mass index is 30.27 kg/m.  Estimated Nutritional Needs:   Kcal:  1473-1600  Protein:  92-110 grams  Fluid:  > 1.5 L/day  Maylon Peppers RD, LDN, CNSC [phone]  Pager [phone] After Hours Pager

## 2017-07-04 NOTE — Progress Notes (Signed)
  Speech Language Pathology Treatment: Dysphagia  Patient Details Name: Melissa Cochran MRN: [id number] DOB: 1936/02/20 Today's Date: 07/04/2017 Time: 1520-1530 SLP Time Calculation (min) (ACUTE ONLY): 10 min  Assessment / Plan / Recommendation Clinical Impression  Pt alert, communicating.  Accepted nectar-thick liquids from a spoon and several bites of Magic Cup, but then declined further POs.  Intermittent delayed cough/throat clearing after POs. Concerns are related to poor motivation to eat, as well as backflow of POs from cervical esophagus that were subsequently aspirated per recent MBS.  Despite best efforts to prevent aspiration with diet modifications, postural adjustments, and careful feeding, pt remains a high aspiration risk.  D/W  RN.  Will follow - no family has been available for education purposes.   HPI HPI: 82 year old female with PMH of HTN, CHF, COPD, HLD, CAD, Seizures, Meningioma. Presented to [facility] 2/16 with seizure activity.  Intubated 2/16-2/21. Recent Hospitalization 10/7-10/16. Found unresponsive, intubated, concerning for seizures. 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 Ultimately Trach and PEG was placed as mental status did not improve. Discharged to [facility]. Decannulation shortly after due to improvement. Went to [facility], since has been weak and wheelchair bound, able to feed self, alert and oriented      SLP Plan  Continue with current plan of care       Recommendations  Diet recommendations: Dysphagia 1 (puree);Nectar-thick liquid Liquids provided via: Teaspoon Medication Administration: Crushed with puree Supervision: Patient able to self feed Compensations: Slow rate;Small sips/bites                Oral Care Recommendations: Oral care BID SLP Visit Diagnosis: Dysphagia, oropharyngeal phase (R13.12);Dysphagia, pharyngoesophageal phase (R13.14) Plan: Continue with current plan of care       GO                Assunta Curtis 07/04/2017, 3:37 PM

## 2017-07-05 ENCOUNTER — Other Ambulatory Visit: Payer: Self-pay

## 2017-07-05 ENCOUNTER — Encounter (HOSPITAL_COMMUNITY): Payer: Self-pay | Admitting: General Practice

## 2017-07-05 LAB — GLUCOSE, CAPILLARY
GLUCOSE-CAPILLARY: 88 mg/dL (ref 65–99)
GLUCOSE-CAPILLARY: 88 mg/dL (ref 65–99)
Glucose-Capillary: 108 mg/dL — ABNORMAL HIGH (ref 65–99)
Glucose-Capillary: 87 mg/dL (ref 65–99)
Glucose-Capillary: 93 mg/dL (ref 65–99)
Glucose-Capillary: 97 mg/dL (ref 65–99)

## 2017-07-05 NOTE — Progress Notes (Signed)
PROGRESS NOTE  Melissa Cochran KYH:[id number] DOB: [birth year] DOA: 06/26/2017 PCP: Teodoro Spray, MD   LOS: 9 days   Brief Narrative / Interim history: 82 yo female with history of hypertension, COPD, hyperlipidemia, coronary artery disease, seizure disorder, CHF was brought to the hospital after a seizure activity for about 45 minutes, intubated in the field and admitted to the ICU.  She recently had a complicated hospitalization in October 2018 again in the setting of seizure disorder requiring intubation, tracheostomy and PEG placement and [facility].  She improved and was decannulated.  She was in rehab when this episode happened. She was extubated on 2/21, she is tolerating this.  No further seizure episodes.  Neurology is following.  Assessment & Plan: Principal Problem:   Seizures ([place]) Active Problems:   E-coli UTI, ESBL    A-fib (HCC)   Need for protective airway ventilation   Seizure disorder  -neurology following, appreciate input. Currently patient is on Depakote 1 g Q8H and Keppra 1g BID -valproic acid level still subtherapeutic on 2/23 at 35. This is due to her being on Meropenem which significantly increases Depakote clearance -per neurology recommendations are to recheck a valproic level again on Wednesday, goal 50-100.  -will order the level for tomorrow morning, will need to discuss with pharmacy and neurology once level is back regarding final home dose  ESBL UTI -treated with Meropenem 2/17 to 2/20 and fosfomycin x 1 on 2/20 -no further signs of infection, she is afebrile  Paroxysmal A fib -rate controlled with Metoprolol. If she has consistent po intake will switch to po.  She is getting a formal swallow eval today -will need anticoagulation once seizures are controlled she is no longer at risk  Acute encephalopathy -This was thought to be in the setting of seizures and postictal status, now improved and appears alert and  appropriate  HTN -continue Norvasc, Lasix, Lisinopril -Blood pressure this morning 168/84, keep on same regimen  Acute hypoxic respiratory failure -with inability to protect her airway, intubated given seizure episodes -now extubated, on [place], comfortable, wean off oxygen as tolerated.   Acute on chronic mild systolic CHF -2D echo with EF 45-50%, in A fib -CXR 2/19 with slight fluid overload  -betablockers, IV Lasix will need cards eval likely in outpatient setting once acute illness improved.  -Continue IV Lasix today   DVT prophylaxis: heparin s.q. Code Status: Full code Family Communication: no family at bedside Disposition Plan: home vs SNF when ready   Consultants:   PCCM  Neurology   Procedures:   2D echo: Study Conclusions - Left ventricle: The cavity size was mildly dilated. Systolic function was mildly reduced. The estimated ejection fraction was in the range of 45% to 50%. Diffuse hypokinesis. The study is not technically sufficient to allow evaluation of LV diastolic function. - Aortic valve: Transvalvular velocity was within the normal range. There was no stenosis. There was no regurgitation. Mitral valve: Calcified annulus. Transvalvular velocity was within the normal range. There was no evidence for stenosis. There was mild to moderate regurgitation. - Left atrium: The atrium was severely dilated. - Right ventricle: The cavity size was normal. Wall thickness was normal. Systolic function was normal. - Atrial septum: No defect or patent foramen ovale was identified. - Tricuspid valve: There was moderate regurgitation. - Pulmonary arteries: Systolic pressure was moderately increased. PA peak pressure: 56 mm Hg (S).  Antimicrobials:  Meropenem 2/17 >> 2/20   Fosfomycin 2/20 x 1  dose  Subjective: -Much improved since yesterday, alert, appropriate.  Denies any pain, denies any shortness of breath, denies any palpitations  Objective: Vitals:   07/05/17 0500  07/05/17 0700 07/05/17 0820 07/05/17 1002  BP: (!) 160/77  (!) 168/84   Pulse: (!) 114  (!) 108   Resp: (!) 22  18   Temp: 98.7 F (37.1 C)  98.2 F (36.8 C)   TempSrc: Oral  Oral   SpO2: 97%  100% 99%  Weight:  81.3 kg (179 lb 3.2 oz)    Height:        Intake/Output Summary (Last 24 hours) at 07/05/2017 1058 Last data filed at 07/05/2017 0815 Gross per 24 hour  Intake 410 ml  Output 1350 ml  Net -940 ml   Filed Weights   07/03/17 0400 07/04/17 0448 07/05/17 0700  Weight: 78.9 kg (173 lb 15.1 oz) 80 kg (176 lb 5.9 oz) 81.3 kg (179 lb 3.2 oz)    Examination:  Constitutional: No distress, sleeping when I entered the room but wakes up easily Eyes: No scleral icterus ENMT: Moist mucous membranes Neck: Normal, supple Respiratory: no wheezing, somewhat coarse breath sounds Cardiovascular: Irregular, no murmurs.  Trace lower extremity edema Abdomen: Soft, nontender, nondistended, positive bowel sounds Skin: No rashes seen Neurologic: Equal strength,   Data Reviewed: I have independently reviewed following labs and imaging studies   CBC: Recent Labs  Lab 06/29/17 0443 06/30/17 0338  WBC 5.3 5.2  NEUTROABS 3.4 2.1  HGB 9.0* 8.9*  HCT 30.4* 30.0*  MCV 85.9 86.7  PLT 193 378   Basic Metabolic Panel: Recent Labs  Lab 06/29/17 0443 06/29/17 1707 06/30/17 0338 06/30/17 1616 07/01/17 0300 07/02/17 0300 07/03/17 0305  NA 144  --  143  --  144 145 145  K 3.3*  --  3.6  --  3.7 3.6 3.2*  CL 111  --  109  --  105 104 104  CO2 24  --  26  --  27 30 31   GLUCOSE 111*  --  95  --  104* 119* 108*  BUN 21*  --  21*  --  15 14 18   CREATININE 0.46  --  0.42*  --  1.18* 0.50 0.51  CALCIUM 8.8*  --  9.1  --  9.8 10.0 9.8  MG 1.8 1.9 1.9 1.9  --   --   --   PHOS 2.5 2.3* 2.5 2.4*  --   --   --    GFR: Estimated Creatinine Clearance: 57.8 mL/min (by C-G formula based on SCr of 0.51 mg/dL). Liver Function Tests: Recent Labs  Lab 06/29/17 0443  AST 16  ALT 13*  ALKPHOS  66  BILITOT 0.3  PROT 4.6*  ALBUMIN 2.1*   No results for input(s): LIPASE, AMYLASE in the last 168 hours. Recent Labs  Lab 06/29/17 0937  AMMONIA 30   Coagulation Profile: No results for input(s): INR, PROTIME in the last 168 hours. Cardiac Enzymes: No results for input(s): CKTOTAL, CKMB, CKMBINDEX, TROPONINI in the last 168 hours. BNP (last 3 results) No results for input(s): PROBNP in the last 8760 hours. HbA1C: No results for input(s): HGBA1C in the last 72 hours. CBG: Recent Labs  Lab 07/04/17 0736 07/04/17 1144 07/04/17 1540 07/05/17 0028 07/05/17 0507  GLUCAP 117* 116* 98 93 97   Lipid Profile: No results for input(s): CHOL, HDL, LDLCALC, TRIG, CHOLHDL, LDLDIRECT in the last 72 hours. Thyroid Function Tests: No results for input(s): TSH, T4TOTAL,  FREET4, T3FREE, THYROIDAB in the last 72 hours. Anemia Panel: No results for input(s): VITAMINB12, FOLATE, FERRITIN, TIBC, IRON, RETICCTPCT in the last 72 hours. Urine analysis:    Component Value Date/Time   COLORURINE YELLOW (A) 06/25/2017 2052   APPEARANCEUR HAZY (A) 06/25/2017 2052   APPEARANCEUR Cloudy (A) 01/29/2016 1126   LABSPEC 1.017 06/25/2017 2052   LABSPEC 1.020 01/23/2014 1047   PHURINE 5.0 06/25/2017 2052   GLUCOSEU NEGATIVE 06/25/2017 2052   GLUCOSEU NEGATIVE 01/23/2014 1047   HGBUR MODERATE (A) 06/25/2017 2052   BILIRUBINUR NEGATIVE 06/25/2017 2052   BILIRUBINUR Negative 01/13/2016 [place] 01/23/2014 [place] 06/25/2017 2052   PROTEINUR NEGATIVE 06/25/2017 2052   UROBILINOGEN 0.2 08/04/2015 1140   NITRITE POSITIVE (A) 06/25/2017 2052   LEUKOCYTESUR SMALL (A) 06/25/2017 2052   LEUKOCYTESUR 3+ (A) 01/13/2016 1110   LEUKOCYTESUR TRACE 01/23/2014 1047   Sepsis Labs: Invalid input(s): PROCALCITONIN, LACTICIDVEN  Recent Results (from the past 240 hour(s))  Blood Culture (routine x 2)     Status: None   Collection Time: 06/25/17  8:52 PM  Result Value Ref  Range Status   Specimen Description BLOOD LEFT HAND  Final   Special Requests   Final    BOTTLES DRAWN AEROBIC AND ANAEROBIC Blood Culture adequate volume   Culture   Final    NO GROWTH 5 DAYS Performed at [facility], [address] [place], [place] [address]    Report Status 06/30/2017 FINAL  Final  Blood Culture (routine x 2)     Status: None   Collection Time: 06/25/17  8:52 PM  Result Value Ref Range Status   Specimen Description BLOOD LEFT FOREARM  Final   Special Requests   Final    BOTTLES DRAWN AEROBIC AND ANAEROBIC Blood Culture adequate volume   Culture   Final    NO GROWTH 5 DAYS Performed at [facility], [address] [place], [place] [address]    Report Status 06/30/2017 FINAL  Final  Urine culture     Status: Abnormal   Collection Time: 06/25/17  8:52 PM  Result Value Ref Range Status   Specimen Description   Final    URINE, RANDOM Performed at [facility], [address], [place], [place] [address]    Special Requests   Final    NONE Performed at [facility], [address] [place], [place] [address]    Culture (A)  Final    >=100,000 COLONIES/mL ESCHERICHIA COLI Confirmed Extended Spectrum Beta-Lactamase Producer (ESBL).  In bloodstream infections from ESBL organisms, carbapenems are preferred over piperacillin/tazobactam. They are shown to have a lower risk of mortality. Performed at [facility], [place] [address], [place], [place] [address]    Report Status 06/28/2017 FINAL  Final   Organism ID, Bacteria ESCHERICHIA COLI (A)  Final      Susceptibility   Escherichia coli - MIC*    AMPICILLIN >=32 RESISTANT Resistant     CEFAZOLIN >=64 RESISTANT Resistant     CEFTRIAXONE RESISTANT Resistant     CIPROFLOXACIN >=4 RESISTANT Resistant     GENTAMICIN <=1 SENSITIVE Sensitive     IMIPENEM <=0.25 SENSITIVE Sensitive     NITROFURANTOIN <=16 SENSITIVE Sensitive     TRIMETH/SULFA >=320 RESISTANT Resistant      AMPICILLIN/SULBACTAM >=32 RESISTANT Resistant     PIP/TAZO <=4 SENSITIVE Sensitive     Extended ESBL POSITIVE Resistant     * >=100,000 COLONIES/mL ESCHERICHIA COLI  CSF culture     Status: None   Collection Time: 06/25/17  9:43 PM  Result Value Ref Range Status   Specimen Description   Final    CSF Performed at [facility], [address] [place], [place] [address]    Special Requests   Final    NONE Performed at [facility], [address] [place], [place] [address]    Gram Stain   Final    NO ORGANISMS SEEN RARE WBC SEEN NO RBCS SEEN  Performed at [facility], [address] [place], [place] [address]    Culture   Final    NO GROWTH 3 DAYS Performed at [facility], [place] [address] [place], [place] [address]    Report Status 06/29/2017 FINAL  Final  MRSA PCR Screening     Status: None   Collection Time: 06/26/17  1:26 AM  Result Value Ref Range Status   MRSA by PCR NEGATIVE NEGATIVE Final    Comment:        The GeneXpert MRSA Assay (FDA approved for NASAL specimens only), is one component of a comprehensive MRSA colonization surveillance program. It is not intended to diagnose MRSA infection nor to guide or monitor treatment for MRSA infections. Performed at [facility], [place] [address], [place], [place] [address]   Respiratory Panel by PCR     Status: None   Collection Time: 06/26/17  1:26 AM  Result Value Ref Range Status   Adenovirus NOT DETECTED NOT DETECTED Final   Coronavirus 229E NOT DETECTED NOT DETECTED Final   Coronavirus HKU1 NOT DETECTED NOT DETECTED Final   Coronavirus NL63 NOT DETECTED NOT DETECTED Final   Coronavirus OC43 NOT DETECTED NOT DETECTED Final   Metapneumovirus NOT DETECTED NOT DETECTED Final   Rhinovirus / Enterovirus NOT DETECTED NOT DETECTED Final   Influenza A NOT DETECTED NOT DETECTED Final   Influenza B NOT DETECTED NOT DETECTED Final   Parainfluenza Virus 1 NOT DETECTED NOT  DETECTED Final   Parainfluenza Virus 2 NOT DETECTED NOT DETECTED Final   Parainfluenza Virus 3 NOT DETECTED NOT DETECTED Final   Parainfluenza Virus 4 NOT DETECTED NOT DETECTED Final   Respiratory Syncytial Virus NOT DETECTED NOT DETECTED Final   Bordetella pertussis NOT DETECTED NOT DETECTED Final   Chlamydophila pneumoniae NOT DETECTED NOT DETECTED Final   Mycoplasma pneumoniae NOT DETECTED NOT DETECTED Final    Comment: Performed at [facility], [place] [address], [place], [place] [address]  Culture, respiratory (NON-Expectorated)     Status: None   Collection Time: 06/26/17  1:26 AM  Result Value Ref Range Status   Specimen Description TRACHEAL ASPIRATE  Final   Special Requests NONE  Final   Gram Stain   Final    RARE WBC PRESENT, PREDOMINANTLY MONONUCLEAR FEW GRAM POSITIVE RODS FEW SQUAMOUS EPITHELIAL CELLS PRESENT Performed at [facility], [place] [address], [place], [place] [address]    Culture MODERATE PSEUDOMONAS AERUGINOSA  Final   Report Status 06/28/2017 FINAL  Final   Organism ID, Bacteria PSEUDOMONAS AERUGINOSA  Final      Susceptibility   Pseudomonas aeruginosa - MIC*    CEFTAZIDIME 2 SENSITIVE Sensitive     CIPROFLOXACIN 2 INTERMEDIATE Intermediate     GENTAMICIN 2 SENSITIVE Sensitive     IMIPENEM 2 SENSITIVE Sensitive     PIP/TAZO <=4 SENSITIVE Sensitive     CEFEPIME 4 SENSITIVE Sensitive     * MODERATE PSEUDOMONAS  AERUGINOSA  Culture, blood (routine x 2)     Status: None (Preliminary result)   Collection Time: 07/01/17  2:43 AM  Result Value Ref Range Status   Specimen Description BLOOD LEFT HAND  Final   Special Requests IN PEDIATRIC BOTTLE Blood Culture adequate volume  Final   Culture   Final    NO GROWTH 3 DAYS Performed at [facility], [place] [address], [place], [place] [address]    Report Status PENDING  Incomplete  Culture, blood (routine x 2)     Status: None (Preliminary result)   Collection Time: 07/01/17  2:50 AM  Result Value Ref  Range Status   Specimen Description BLOOD RIGHT HAND  Final   Special Requests IN PEDIATRIC BOTTLE Blood Culture adequate volume  Final   Culture   Final    NO GROWTH 3 DAYS Performed at [facility], [place] [address] [place], [place] [address]    Report Status PENDING  Incomplete      Radiology Studies: No results found.   Scheduled Meds: . amLODipine  10 mg Oral Daily  . Chlorhexidine Gluconate Cloth  6 each Topical Daily  . furosemide  20 mg Intravenous Daily  . heparin  5,000 Units Subcutaneous Q8H  . ipratropium  0.5 mg Nebulization TID  . lisinopril  2.5 mg Per Tube Daily  . metoprolol tartrate  2.5 mg Intravenous Q6H  . pantoprazole sodium  40 mg Per Tube Daily   Continuous Infusions: . dextrose 5% lactated ringers with KCl 20 mEq/L 30 mL/hr at 07/04/17 1500  . levETIRAcetam Stopped (07/05/17 0555)  . valproate sodium Stopped (07/05/17 0734)    Marzetta Board, MD, PhD [facility] Pager [phone]  If 7PM-7AM, please contact night-coverage [email or web address] Password [facility] 07/05/2017, 10:58 AM

## 2017-07-05 NOTE — Care Management Note (Signed)
Case Management Note  Patient Details  Name: Melissa Cochran MRN: [id number] Date of Birth: [birth year]  Subjective/Objective:                    Action/Plan: Pt transferred from [place]. She originally is from [facility]. Plans if for her to return when medically stable. CM following.    Expected Discharge Date:                  Expected Discharge Plan:  Skilled Nursing Facility  In-House Referral:  Clinical Social Work  Discharge planning Services  CM Consult  Post Acute Care Choice:    Choice offered to:     DME Arranged:    DME Agency:     HH Arranged:    [place] Agency:     Status of Service:  In process, will continue to follow  If discussed at Long Length of Stay Meetings, dates discussed:    Additional Comments:  Pollie Friar, RN 07/05/2017, 1:10 PM

## 2017-07-06 LAB — COMPREHENSIVE METABOLIC PANEL
ALBUMIN: 3 g/dL — AB (ref 3.5–5.0)
ALT: 17 U/L (ref 14–54)
ANION GAP: 10 (ref 5–15)
AST: 18 U/L (ref 15–41)
Alkaline Phosphatase: 70 U/L (ref 38–126)
BILIRUBIN TOTAL: 0.7 mg/dL (ref 0.3–1.2)
BUN: 10 mg/dL (ref 6–20)
CHLORIDE: 101 mmol/L (ref 101–111)
CO2: 31 mmol/L (ref 22–32)
Calcium: 9.7 mg/dL (ref 8.9–10.3)
Creatinine, Ser: 0.48 mg/dL (ref 0.44–1.00)
GFR calc Af Amer: >60 mL/min (ref >60)
GLUCOSE: 88 mg/dL (ref 65–99)
POTASSIUM: 3.6 mmol/L (ref 3.5–5.1)
Sodium: 142 mmol/L (ref 135–145)
TOTAL PROTEIN: 5.3 g/dL — AB (ref 6.5–8.1)

## 2017-07-06 LAB — VALPROIC ACID LEVEL: VALPROIC ACID LVL: 97 ug/mL (ref 50.0–100.0)

## 2017-07-06 LAB — CULTURE, BLOOD (ROUTINE X 2)
Culture: NO GROWTH
Culture: NO GROWTH
SPECIAL REQUESTS: ADEQUATE
Special Requests: ADEQUATE

## 2017-07-06 LAB — CBC
HEMATOCRIT: 33 % — AB (ref 36.0–46.0)
HEMOGLOBIN: 9.4 g/dL — AB (ref 12.0–15.0)
MCH: 25.4 pg — ABNORMAL LOW (ref 26.0–34.0)
MCHC: 28.5 g/dL — ABNORMAL LOW (ref 30.0–36.0)
MCV: 89.2 fL (ref 78.0–100.0)
Platelets: 259 10*3/uL (ref 150–400)
RBC: 3.7 MIL/uL — ABNORMAL LOW (ref 3.87–5.11)
RDW: 15.2 % (ref 11.5–15.5)
WBC: 5.7 10*3/uL (ref 4.0–10.5)

## 2017-07-06 LAB — GLUCOSE, CAPILLARY
GLUCOSE-CAPILLARY: 100 mg/dL — AB (ref 65–99)
GLUCOSE-CAPILLARY: 86 mg/dL (ref 65–99)
Glucose-Capillary: 83 mg/dL (ref 65–99)
Glucose-Capillary: 75 mg/dL (ref 65–99)
Glucose-Capillary: 123 mg/dL — ABNORMAL HIGH (ref 65–99)

## 2017-07-06 MED ORDER — LEVETIRACETAM 100 MG/ML PO SOLN
1000.0000 mg | Freq: Two times a day (BID) | ORAL | Status: DC
  Administered 2017-07-06 – 2017-07-08 (×5): 1000 mg via ORAL
  Filled 2017-07-06 (×5): qty 10

## 2017-07-06 MED ORDER — FUROSEMIDE 40 MG PO TABS
40.0000 mg | ORAL_TABLET | Freq: Every day | ORAL | Status: DC
  Administered 2017-07-07 – 2017-07-08 (×2): 40 mg
  Filled 2017-07-06 (×2): qty 1

## 2017-07-06 MED ORDER — VALPROATE SODIUM 250 MG/5ML PO SOLN
500.0000 mg | Freq: Two times a day (BID) | ORAL | Status: DC
  Administered 2017-07-06 – 2017-07-08 (×4): 500 mg
  Filled 2017-07-06 (×4): qty 10

## 2017-07-06 MED ORDER — SODIUM CHLORIDE 0.9% FLUSH
10.0000 mL | Freq: Two times a day (BID) | INTRAVENOUS | Status: DC
  Administered 2017-07-06: 10 mL
  Administered 2017-07-07: 20 mL

## 2017-07-06 MED ORDER — CARVEDILOL 6.25 MG PO TABS
6.2500 mg | ORAL_TABLET | Freq: Two times a day (BID) | ORAL | Status: DC
  Administered 2017-07-06 – 2017-07-08 (×4): 6.25 mg
  Filled 2017-07-06 (×4): qty 1

## 2017-07-06 MED ORDER — SODIUM CHLORIDE 0.9% FLUSH: 10.0000 mL | INTRAVENOUS | Status: DC | PRN

## 2017-07-06 NOTE — Care Management Important Message (Signed)
Important Message  Patient Details  Name: Melissa Cochran MRN: [id number] Date of Birth: Oct 14, 1935   Medicare Important Message Given:  Yes    Carles Collet, RN 07/06/2017, 11:31 AM

## 2017-07-06 NOTE — Progress Notes (Signed)
PROGRESS NOTE  Melissa Cochran MVH:[id number] DOB: 11-Aug-1935 DOA: 06/26/2017 PCP: [name], MD   LOS: 10 days   Brief Narrative / Interim history: 82 yo female with history of hypertension, COPD, hyperlipidemia, coronary artery disease, seizure disorder, CHF was brought to the hospital after a seizure activity for about 45 minutes, intubated in the field and admitted to the ICU.  She recently had a complicated hospitalization in October 2018 again in the setting of seizure disorder requiring intubation, tracheostomy and PEG placement and [facility]  She improved and was decannulated.  She was in rehab when this episode happened. She was extubated on 2/21, she is tolerating this.  No further seizure episodes.    Neurology is involved in care but has currently signed off   Assessment & Plan: Principal Problem:   Seizures ([place]) Active Problems:   E-coli UTI, ESBL    A-fib ([place])   Need for protective airway ventilation   Seizure disorder  -neurology following, appreciate input.  -valproic acid level still subtherapeutic on 2/23 at 35. This is due to her being on Meropenem which significantly increases Depakote clearance -I have discussed valproate dosing with pharmacy given the discontinuation of Meropenem dosing has been left at 500 mg per tube bid.  - Continue Keppra 1000 mg po bid.   ESBL UTI -treated with Meropenem 2/17 to 2/20 and fosfomycin x 1 on 2/20 -no further signs of infection, she is afebrile  Paroxysmal A fib -rate controlled with Metoprolol.  But will transition to tube -will need anticoagulation once seizures are controlled she is no longer a fall risk  Acute encephalopathy -secondary to seizure  HTN -continue current regimen Norvasc, Lasix, Lisinopril  Acute hypoxic respiratory failure -with inability to protect her airway, intubated given seizure episodes -now extubated, on [place], comfortable, wean off oxygen as  tolerated.   Acute on chronic mild systolic CHF -2D echo with EF 45-50%, in A fib -CXR 2/19 with slight fluid overload  -betablockers, IV Lasix will need cards eval likely in outpatient setting once acute illness improved.  -Continue IV Lasix today   DVT prophylaxis: heparin s.q. Code Status: Full code Family Communication: no family at bedside Disposition Plan: home vs SNF when ready   Consultants:   PCCM  Neurology   Procedures:   2D echo: Study Conclusions - Left ventricle: The cavity size was mildly dilated. Systolic function was mildly reduced. The estimated ejection fraction was in the range of 45% to 50%. Diffuse hypokinesis. The study is not technically sufficient to allow evaluation of LV diastolic function. - Aortic valve: Transvalvular velocity was within the normal range. There was no stenosis. There was no regurgitation. Mitral valve: Calcified annulus. Transvalvular velocity was within the normal range. There was no evidence for stenosis. There was mild to moderate regurgitation. - Left atrium: The atrium was severely dilated. - Right ventricle: The cavity size was normal. Wall thickness was normal. Systolic function was normal. - Atrial septum: No defect or patent foramen ovale was identified. - Tricuspid valve: There was moderate regurgitation. - Pulmonary arteries: Systolic pressure was moderately increased. PA peak pressure: 56 mm Hg (S).  Antimicrobials:  Meropenem 2/17 >> 2/20   Fosfomycin 2/20 x 1 dose  Subjective: No new complaints reported.  Objective: Vitals:   07/06/17 0405 07/06/17 0740 07/06/17 0919 07/06/17 1115  BP: (!) 148/77 (!) 175/83 (!) 141/70 127/64  Pulse: 97 86  61  Resp: 20   18  Temp: 97.9  F (36.6 C) 98.5 F (36.9 C)  98.9 F (37.2 C)  TempSrc: Axillary Oral  Axillary  SpO2: 100% 92% 94% 91%  Weight: 83.2 kg (183 lb 6.8 oz)     Height:        Intake/Output Summary (Last 24 hours) at 07/06/2017 1401 Last data filed at  07/06/2017 1329 Gross per 24 hour  Intake 680 ml  Output 1500 ml  Net -820 ml   Filed Weights   07/04/17 0448 07/05/17 0700 07/06/17 0405  Weight: 80 kg (176 lb 5.9 oz) 81.3 kg (179 lb 3.2 oz) 83.2 kg (183 lb 6.8 oz)    Examination:  Constitutional: pt in nad. Eyes: No scleral icterus ENMT: Moist mucous membranes Neck: Normal, supple Respiratory: no wheezing, equal chest rise.  Cardiovascular: Irregular, no murmurs.  Trace lower extremity edema Abdomen: Soft, nontender, nondistended, positive bowel sounds Skin: No rashes on limited exam. Neurologic: Equal strength   Data Reviewed: I have independently reviewed following labs and imaging studies   CBC: Recent Labs  Lab 06/30/17 0338 07/06/17 0846  WBC 5.2 5.7  NEUTROABS 2.1  --   HGB 8.9* 9.4*  HCT 30.0* 33.0*  MCV 86.7 89.2  PLT 195 409   Basic Metabolic Panel: Recent Labs  Lab 06/29/17 1707 06/30/17 0338 06/30/17 1616 07/01/17 0300 07/02/17 0300 07/03/17 0305 07/06/17 0846  NA  --  143  --  144 145 145 142  K  --  3.6  --  3.7 3.6 3.2* 3.6  CL  --  109  --  105 104 104 101  CO2  --  26  --  27 30 31 31   GLUCOSE  --  95  --  104* 119* 108* 88  BUN  --  21*  --  15 14 18 10   CREATININE  --  0.42*  --  1.18* 0.50 0.51 0.48  CALCIUM  --  9.1  --  9.8 10.0 9.8 9.7  MG 1.9 1.9 1.9  --   --   --   --   PHOS 2.3* 2.5 2.4*  --   --   --   --    GFR: Estimated Creatinine Clearance: 58.5 mL/min (by C-G formula based on SCr of 0.48 mg/dL). Liver Function Tests: Recent Labs  Lab 07/06/17 0846  AST 18  ALT 17  ALKPHOS 70  BILITOT 0.7  PROT 5.3*  ALBUMIN 3.0*   No results for input(s): LIPASE, AMYLASE in the last 168 hours. No results for input(s): AMMONIA in the last 168 hours. Coagulation Profile: No results for input(s): INR, PROTIME in the last 168 hours. Cardiac Enzymes: No results for input(s): CKTOTAL, CKMB, CKMBINDEX, TROPONINI in the last 168 hours. BNP (last 3 results) No results for input(s):  PROBNP in the last 8760 hours. HbA1C: No results for input(s): HGBA1C in the last 72 hours. CBG: Recent Labs  Lab 07/05/17 1935 07/05/17 2355 07/06/17 0349 07/06/17 0740 07/06/17 1114  GLUCAP 87 88 83 75 100*   Lipid Profile: No results for input(s): CHOL, HDL, LDLCALC, TRIG, CHOLHDL, LDLDIRECT in the last 72 hours. Thyroid Function Tests: No results for input(s): TSH, T4TOTAL, FREET4, T3FREE, THYROIDAB in the last 72 hours. Anemia Panel: No results for input(s): VITAMINB12, FOLATE, FERRITIN, TIBC, IRON, RETICCTPCT in the last 72 hours. Urine analysis:    Component Value Date/Time   COLORURINE YELLOW (A) 06/25/2017 2052   APPEARANCEUR HAZY (A) 06/25/2017 2052   APPEARANCEUR Cloudy (A) 01/29/2016 1126   LABSPEC 1.017 06/25/2017  2052   LABSPEC 1.020 01/23/2014 1047   PHURINE 5.0 06/25/2017 2052   GLUCOSEU NEGATIVE 06/25/2017 2052   GLUCOSEU NEGATIVE 01/23/2014 1047   HGBUR MODERATE (A) 06/25/2017 2052   BILIRUBINUR NEGATIVE 06/25/2017 2052   BILIRUBINUR Negative 01/13/2016 [place] 01/23/2014 [place] 06/25/2017 2052   PROTEINUR NEGATIVE 06/25/2017 2052   UROBILINOGEN 0.2 08/04/2015 1140   NITRITE POSITIVE (A) 06/25/2017 2052   LEUKOCYTESUR SMALL (A) 06/25/2017 2052   LEUKOCYTESUR 3+ (A) 01/13/2016 1110   LEUKOCYTESUR TRACE 01/23/2014 1047   Sepsis Labs: Invalid input(s): PROCALCITONIN, LACTICIDVEN  Recent Results (from the past 240 hour(s))  Culture, blood (routine x 2)     Status: None   Collection Time: 07/01/17  2:43 AM  Result Value Ref Range Status   Specimen Description BLOOD LEFT HAND  Final   Special Requests IN PEDIATRIC BOTTLE Blood Culture adequate volume  Final   Culture   Final    NO GROWTH 5 DAYS Performed at [facility], [address] [place], [place] [address]    Report Status 07/06/2017 FINAL  Final  Culture, blood (routine x 2)     Status: None   Collection Time: 07/01/17  2:50 AM  Result Value Ref  Range Status   Specimen Description BLOOD RIGHT HAND  Final   Special Requests IN PEDIATRIC BOTTLE Blood Culture adequate volume  Final   Culture   Final    NO GROWTH 5 DAYS Performed at [facility], [place] [address], [place], [place] [address]    Report Status 07/06/2017 FINAL  Final    Radiology Studies: No results found.   Scheduled Meds: . amLODipine  10 mg Oral Daily  . Chlorhexidine Gluconate Cloth  6 each Topical Daily  . furosemide  20 mg Intravenous Daily  . heparin  5,000 Units Subcutaneous Q8H  . ipratropium  0.5 mg Nebulization TID  . levETIRAcetam  1,000 mg Oral BID  . lisinopril  2.5 mg Per Tube Daily  . metoprolol tartrate  2.5 mg Intravenous Q6H  . pantoprazole sodium  40 mg Per Tube Daily  . Valproate Sodium  500 mg Per Tube BID   Continuous Infusions:  [facility] Pager [phone]  If 7PM-7AM, please contact night-coverage [email or web address] [facility] 07/06/2017, 2:01 PM

## 2017-07-06 NOTE — Progress Notes (Signed)
Pharmacy note: valproic acid  82 yo female with breakthrough seizures due to subtherapeutic antiepileptic levels on valproic acid 1000mg  IV q8h.  This was due to a drug interaction with meropenem.  Valproic acid level was 37 on 06/25/17 and now 97 (meropenem discontinued on 2/20). Valproic acid was doses at 500mg  po q12h at her nursing home.   -discussed with Neuro and Dr Wendee Beavers  Plan -resume valproic acid to 500mg  po q12h -Will follow patient progress  Hildred Laser, PharmD Clinical Pharmacist Clinical phone from 8:30-4:00 is [phone] After 4pm, please call Main Rx [phone]) for assistance. 07/06/2017 10:59 AM

## 2017-07-07 LAB — GLUCOSE, CAPILLARY
GLUCOSE-CAPILLARY: 90 mg/dL (ref 65–99)
GLUCOSE-CAPILLARY: 81 mg/dL (ref 65–99)
GLUCOSE-CAPILLARY: 105 mg/dL — AB (ref 65–99)
Glucose-Capillary: 159 mg/dL — ABNORMAL HIGH (ref 65–99)
Glucose-Capillary: 88 mg/dL (ref 65–99)
Glucose-Capillary: 89 mg/dL (ref 65–99)

## 2017-07-07 MED ORDER — AMLODIPINE BESYLATE 10 MG PO TABS: 10.0000 mg | ORAL_TABLET | Freq: Every day | ORAL | Status: DC

## 2017-07-07 MED ORDER — CARVEDILOL 6.25 MG PO TABS: 6.2500 mg | ORAL_TABLET | Freq: Two times a day (BID) | ORAL | Status: DC

## 2017-07-07 MED ORDER — LEVETIRACETAM 100 MG/ML PO SOLN: 1000.0000 mg | Freq: Two times a day (BID) | ORAL | 12 refills | Status: DC

## 2017-07-07 MED ORDER — VALPROATE SODIUM 250 MG/5ML PO SOLN: 500.0000 mg | Freq: Two times a day (BID) | ORAL | Status: DC

## 2017-07-07 MED ORDER — LISINOPRIL 2.5 MG PO TABS: 2.5000 mg | ORAL_TABLET | Freq: Every day | ORAL | Status: DC

## 2017-07-07 MED ORDER — PANTOPRAZOLE SODIUM 40 MG PO PACK: 40.0000 mg | PACK | Freq: Every day | ORAL | Status: DC

## 2017-07-07 MED ORDER — FUROSEMIDE 40 MG PO TABS: 40.0000 mg | ORAL_TABLET | Freq: Every day | ORAL | Status: DC

## 2017-07-07 MED ORDER — ACETAMINOPHEN 325 MG PO TABS: 650.0000 mg | ORAL_TABLET | Freq: Four times a day (QID) | ORAL | Status: DC | PRN

## 2017-07-07 NOTE — Progress Notes (Signed)
  Speech Language Pathology Treatment: Dysphagia  Patient Details Name: Melissa Cochran MRN: [id number] DOB: March 12, 1936 Today's Date: 07/07/2017 Time: 9935-7017 SLP Time Calculation (min) (ACUTE ONLY): 12 min  Assessment / Plan / Recommendation Clinical Impression  F/u diet tolerance assessment complete. Patient sleeping upon arrival, aroused, but noted to keep eyes closed throughout session despite continuously opening mouth for acceptance of clinician presented bolus. Consumed pureed solid and nectar thick liquid via tsp with max clinician assistance for use of compensatory strategies including positioning for upright posture and alternation of solid and liquid to facilitate esophageal clearance in attempts to decrease risk of retrograde flow of bolus and post prandial aspiration. Congested cough noted prior to and during po intake, cannot r/o aspiration, although appeared to be baseline. Noted stable temperature but increased O2 requirements. Aspiration risk remains high. Will continue to f/u.    HPI HPI: 82 year old female with PMH of HTN, CHF, COPD, HLD, CAD, Seizures, Meningioma. Presented to [facility] 2/16 with seizure activity.  Intubated 2/16-2/21. Recent Hospitalization 10/7-10/16. Found unresponsive, intubated, concerning for seizures. 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 Ultimately Trach and PEG was placed as mental status did not improve. Discharged to [facility]. Decannulation shortly after due to improvement. Went to [facility], since has been weak and wheelchair bound, able to feed self, alert and oriented      SLP Plan  Continue with current plan of care       Recommendations  Diet recommendations: Dysphagia 1 (puree);Nectar-thick liquid Liquids provided via: Teaspoon Medication Administration: Crushed with puree Supervision: Staff to assist with self feeding;Full supervision/cueing for compensatory strategies Compensations: Slow rate;Small sips/bites Postural Changes and/or  Swallow Maneuvers: Seated upright 90 degrees;Upright 30-60 min after meal                Oral Care Recommendations: Oral care BID Follow up Recommendations: Skilled Nursing facility SLP Visit Diagnosis: Dysphagia, oropharyngeal phase (R13.12);Dysphagia, pharyngoesophageal phase (R13.14) Plan: Continue with current plan of care       [name], [place] [phone]    [name] 07/07/2017, 11:27 AM

## 2017-07-07 NOTE — Discharge Summary (Addendum)
Physician Discharge Summary  Melissa Cochran AOZ:[id number] DOB: 07/20/1935 DOA: 06/26/2017  PCP: [name], MD  Admit date: 06/26/2017 Discharge date: 07/08/2017  Time spent: > 35 minutes  Recommendations for Outpatient Follow-up:  1. Please decide when to continue antidepressant if warranted   Discharge Diagnoses:  Principal Problem:   Seizures ([place]) Active Problems:   E-coli UTI, ESBL    A-fib ([place])   Need for protective airway ventilation   Discharge Condition: stablel  Diet recommendation: dysphagia 1 diet  Filed Weights   07/06/17 0405 07/07/17 0500 07/08/17 0502  Weight: 83.2 kg (183 lb 6.8 oz) 78.6 kg (173 lb 4.5 oz) 79.2 kg (174 lb 9.7 oz)    History of present illness:  82 yo female with history of hypertension, COPD, hyperlipidemia, coronary artery disease, seizure disorder, CHF was brought to the hospital after a seizure activity for about 45 minutes, intubated in the field and admitted to the ICU.  She recently had a complicated hospitalization in October 2018 again in the setting of seizure disorder requiring intubation, tracheostomy and PEG placement and [facility].  She improved and was decannulated.  She was in rehab when this episode happened. She was extubated on 2/21, she is tolerating this.  No further seizure episodes.    Neurology was involved while patient was in house.  Hospital Course:  Seizure disorder  -neurology following, appreciate input.  -valproic acid level still subtherapeutic on 2/23 at 35. This is due to her being on Meropenem which significantly increases Depakote clearance -I have discussed valproate dosing with pharmacy given the discontinuation of Meropenem dosing has been left at 500 mg per tube bid.  - Continue Keppra 1000 mg po bid.   ESBL UTI -treated with Meropenem 2/17 to 2/20 and fosfomycin x 1 on 2/20 -no further signs of infection, she is afebrile  Paroxysmal A fib -rate  controlled with Metoprolol.  But will transition to tube -will need anticoagulation once seizures are controlled she is no longer a fall risk  Acute encephalopathy -secondary to seizure  HTN -continue current regimen Norvasc, Lasix, Lisinopril  Acute hypoxic respiratory failure - d/c on supplemental oxygen, wean to room air as tolerated  Acute on chronic mild systolic CHF -2D echo with EF 45-50%, in A fib -CXR 2/19 with slight fluid overload  - placed back on B blocker -Continue Lasix today   Procedures:  2D echo: Study Conclusions - Left ventricle: The cavity size was mildly dilated. Systolicfunction was mildly reduced. The estimated ejection fraction wasin the range of 45% to 50%. Diffuse hypokinesis. The study is nottechnically sufficient to allow evaluation of LV diastolicfunction. - Aortic valve: Transvalvular velocity was within the normal range.There was no stenosis. There was no regurgitation. Mitral valve: Calcified annulus. Transvalvular velocity waswithin the normal range. There was no evidence for stenosis.There was mild to moderate regurgitation. - Left atrium: The atrium was severely dilated. - Right ventricle: The cavity size was normal. Wall thickness wasnormal. Systolic function was normal. - Atrial septum: No defect or patent foramen ovale was identified. - Tricuspid valve: There was moderate regurgitation. - Pulmonary arteries: Systolic pressure was moderately increased.PA peak pressure: 56 mm Hg (S).   Consultations:  PCCM  Neurology    Discharge Exam: Vitals:   07/08/17 0847 07/08/17 1250  BP:  (!) 135/59  Pulse:  88  Resp:  18  Temp:  98 F (36.7 C)  SpO2: 97% 92%    General: Pt in nad, alert  and awake Cardiovascular: no cyanosis Respiratory: equal chest rise, no increased wob  Discharge Instructions   Discharge Instructions    Call MD for:  persistant dizziness or light-headedness   Complete by:  As directed    Call MD  for:  temperature >100.4   Complete by:  As directed    Diet - low sodium heart healthy   Complete by:  As directed    Discharge instructions   Complete by:  As directed    Please ensure primary care physician continues to monitor patient closely when patient transitions back to SNF   Increase activity slowly   Complete by:  As directed      Allergies as of 07/08/2017   No Known Allergies     Medication List    STOP taking these medications   acyclovir 650 mg in dextrose 5 % 100 mL   aspirin EC 81 MG tablet   atorvastatin 10 MG tablet Commonly known as:  LIPITOR   chlorhexidine gluconate (MEDLINE KIT) 0.12 % solution Commonly known as:  PERIDEX   feeding supplement (VITAL 1.5 CAL) Liqd   free water Soln   hydrALAZINE 20 MG/ML injection Commonly known as:  APRESOLINE   levETIRAcetam 500 mg in sodium chloride 0.9 % 100 mL   levETIRAcetam 750 MG tablet Commonly known as:  KEPPRA Replaced by:  levETIRAcetam 100 MG/ML solution   Melatonin 3 MG Tabs   meropenem 1 g in sodium chloride 0.9 % 100 mL   metoprolol tartrate 5 MG/5ML Soln injection Commonly known as:  LOPRESSOR   mouth rinse Liqd solution   ondansetron 4 MG/2ML Soln injection Commonly known as:  ZOFRAN   senna-docusate 8.6-50 MG tablet Commonly known as:  Senokot-S   sertraline 25 MG tablet Commonly known as:  ZOLOFT   tamsulosin 0.4 MG Caps capsule Commonly known as:  FLOMAX   THEREMS Tabs   valproate 1,000 mg in dextrose 5 % 50 mL     TAKE these medications   acetaminophen 325 MG tablet Commonly known as:  TYLENOL Take 2 tablets (650 mg total) by mouth every 6 (six) hours as needed for mild pain (or Fever >/= 101). What changed:  Another medication with the same name was removed. Continue taking this medication, and follow the directions you see here.   albuterol (2.5 MG/3ML) 0.083% nebulizer solution Commonly known as:  PROVENTIL Take 3 mLs (2.5 mg total) by nebulization every 2 (two)  hours as needed for wheezing.   amLODipine 10 MG tablet Commonly known as:  NORVASC Take 1 tablet (10 mg total) by mouth daily.   carvedilol 6.25 MG tablet Commonly known as:  COREG Take 1 tablet (6.25 mg total) by mouth 2 (two) times daily with a meal. What changed:  when to take this   feeding supplement (PRO-STAT SUGAR FREE 64) Liqd Take 30 mLs by mouth 3 (three) times daily.   furosemide 40 MG tablet Commonly known as:  LASIX Take 1 tablet (40 mg total) by mouth daily.   levETIRAcetam 100 MG/ML solution Commonly known as:  KEPPRA Take 10 mLs (1,000 mg total) by mouth 2 (two) times daily. Replaces:  levETIRAcetam 750 MG tablet   lisinopril 2.5 MG tablet Commonly known as:  PRINIVIL,ZESTRIL Take 1 tablet (2.5 mg total) by mouth daily.   multivitamin Liqd Take 15 mLs by mouth daily. What changed:  how to take this   pantoprazole sodium 40 mg/20 mL Pack Commonly known as:  PROTONIX Take 20 mLs (40  mg total) by mouth daily. What changed:    how to take this  when to take this   Valproate Sodium 250 MG/5ML Soln solution Commonly known as:  DEPAKENE Take 10 mLs (500 mg total) by mouth 2 (two) times daily.            Durable Medical Equipment  (From admission, onward)        Start     Ordered   07/07/17 1334  For home use only DME oxygen  Once    Question Answer Comment  Mode or (Route) Nasal cannula   Liters per Minute 2   Frequency Continuous (stationary and portable oxygen unit needed)   Oxygen delivery system Gas      07/07/17 1333     No Known Allergies    The results of significant diagnostics from this hospitalization (including imaging, microbiology, ancillary and laboratory) are listed below for reference.    Significant Diagnostic Studies: Dg Abd 1 View  Result Date: 06/28/2017 CLINICAL DATA:  Orogastric tube placement. EXAM: ABDOMEN - 1 VIEW COMPARISON:  06/25/2016. FINDINGS: Surgical clips noted in the upper abdomen. OG tube noted  coiled stomach. Minimally prominent air-filled loops of small large bowel noted. Mild adynamic ileus can't be excluded. Left base atelectasis. IMPRESSION: Orogastric tube noted with tip coiled in the stomach. Electronically Signed   By: Marcello Moores  Register   On: 06/28/2017 14:29   Dg Abdomen 1 View  Result Date: 06/25/2017 CLINICAL DATA:  NG tube placement. EXAM: ABDOMEN - 1 VIEW COMPARISON:  CT 02/21/2017 FINDINGS: Tip of the enteric tube below the diaphragm in the stomach, the side port is not well visualized but likely just beyond the gastroesophageal junction. Multiple surgical clips and enteric sutures in the upper abdomen. Left renal stone. Nonobstructive bowel gas pattern. IMPRESSION: Tip of the enteric tube below the diaphragm in the stomach, side-port not well visualized but likely just beyond the gastroesophageal junction. Electronically Signed   By: Jeb Levering M.D.   On: 06/25/2017 21:54   Ct Head Wo Contrast  Result Date: 06/25/2017 CLINICAL DATA:  Seizure-like activity. EXAM: CT HEAD WITHOUT CONTRAST TECHNIQUE: Contiguous axial images were obtained from the base of the skull through the vertex without intravenous contrast. COMPARISON:  Head CT 02/13/2017 FINDINGS: Brain: No intraparenchymal hemorrhage or extra-axial collection. No evidence of acute cortical infarct. There is periventricular hypoattenuation compatible with chronic microvascular disease. Unchanged 2.1 cm right convexity meningioma. No abnormality of the underlying brain. Vascular: No hyperdense vessel or unexpected calcification. Skull: Normal visualized skull base, calvarium and extracranial soft tissues. Sinuses/Orbits: No sinus fluid levels or advanced mucosal thickening. No mastoid effusion. Normal orbits. IMPRESSION: 1. No acute abnormality. 2. Unchanged right convexity meningioma without abnormality of the underlying brain. 3. Chronic small vessel disease. Electronically Signed   By: Ulyses Jarred M.D.   On: 06/25/2017  22:22   Mr Brain Wo Contrast  Result Date: 06/28/2017 CLINICAL DATA:  82 y/o F; history of seizures with possible breakthrough seizure. Urosepsis. EXAM: MRI HEAD WITHOUT CONTRAST TECHNIQUE: Multiplanar, multiecho pulse sequences of the brain and surrounding structures were obtained without intravenous contrast. COMPARISON:  06/25/2017 CT head.  2020-04-2217 MRI head. FINDINGS: Brain: Small foci of hemosiderin stained encephalomalacia are present in the right parietal and occipital lobes corresponding to prior areas of infarction on the prior MRI. No reduced diffusion to suggest acute/early subacute infarction. No new susceptibility hypointensity to indicate interval intracranial hemorrhage. No hydrocephalus, extra-axial collection, or effacement of basilar cisterns. Severalnonspecific foci  of T2 FLAIR hyperintense signal abnormality in subcortical and periventricular white matter are compatible withmoderatechronic microvascular ischemic changes for age. Moderatebrain parenchymal volume loss. Interval loss of volume in right hippocampus and increased signal. Stable size and signal of the left hippocampus. Right frontoparietal convexity extra-axial mass measuring 2.1 x 1.9 x 2.2 cm (AP x ML x CC series 5, image 4 and series 8, image 19) with intermediate T1 and increased T2 signal as well as speckled susceptibility hypointensity compatible with mineralization, stable from prior studies. Vascular: Normal flow voids. Skull and upper cervical spine: Normal marrow signal. Sinuses/Orbits: Mildly increased signal within paranasal sinus mucosal and mastoid air cells, likely due to intubation. Tiny left maxillary sinus mucous retention cyst. Orbits are unremarkable. Other: None. IMPRESSION: 1. No acute intracranial abnormality identified. 2. 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 3. 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 4. Moderate chronic microvascular ischemic changes and moderate parenchymal volume loss of the brain. 5. Right frontal parietal convexity stable meningioma. Minimal local mass effect. No edema in the adjacent brain. Electronically Signed   By: Kristine Garbe M.D.   On: 06/28/2017 14:11   Dg Chest Port 1 View  Result Date: 06/28/2017 CLINICAL DATA:  Seizure. EXAM: PORTABLE CHEST 1 VIEW COMPARISON:  Radiograph of June 26, 2017. FINDINGS: Stable cardiomegaly. Endotracheal tube tip is seen at the carina; withdrawal by 3-4 cm is recommended. Interval placement of left subclavian catheter with distal tip in expected position of the SVC. Nasogastric tube is seen in proximal stomach. No pneumothorax is noted. Mild bibasilar subsegmental atelectasis or edema is noted with mild pleural effusions. Bony thorax is unremarkable. IMPRESSION: Endotracheal tube tip is seen at the carina; withdrawal by 3-4 cm is recommended. These results will be called to the ordering clinician or representative by the Radiologist Assistant, and communication documented in the PACS or zVision Dashboard. Interval placement of left subclavian catheter with distal tip in expected position of the SVC. No pneumothorax is noted. Mild bibasilar subsegmental atelectasis or edema is noted with mild bilateral pleural effusions. Electronically Signed   By: Marijo Conception, M.D.   On: 06/28/2017 16:41   Dg Chest Port 1 View  Result Date: 06/26/2017 CLINICAL DATA:  Endotracheal tube placement. EXAM: PORTABLE CHEST 1 VIEW COMPARISON:  Yesterday at 2103 hour FINDINGS: Endotracheal tube 2.8 cm from the carina. Enteric tube in place, tip below the diaphragm. Worsening bibasilar aeration with increasing bibasilar opacities. Mild cardiomegaly with unchanged mediastinal contours. No pneumothorax. IMPRESSION: 1. Endotracheal tube 2.8 cm from the carina.  Enteric tube in place. 2. Worsening bibasilar aeration with increasing  opacities, atelectasis versus pleural effusions. Electronically Signed   By: Jeb Levering M.D.   On: 06/26/2017 01:45   Dg Chest Port 1 View  Result Date: 06/25/2017 CLINICAL DATA:  Intubation. EXAM: PORTABLE CHEST 1 VIEW COMPARISON:  Radiograph 02/22/2017 FINDINGS: Endotracheal tube 2.9 cm from the carina. Enteric tube in place, better defined on concurrent abdominal radiograph. Mild cardiomegaly unchanged. Aortic atherosclerosis. Unchanged left basilar opacity likely pleural fluid and atelectasis. No pulmonary edema. No pneumothorax. IMPRESSION: 1. Endotracheal tube 2.9 cm from the carina.  Enteric tube in place. 2. Unchanged left basilar opacity since October 2018 likely combination of small pleural effusion and atelectasis/airspace disease. Mild cardiomegaly is unchanged. Electronically Signed   By: Jeb Levering M.D.   On: 06/25/2017 21:52    Microbiology: Recent Results (from the past  240 hour(s))  Culture, blood (routine x 2)     Status: None   Collection Time: 07/01/17  2:43 AM  Result Value Ref Range Status   Specimen Description BLOOD LEFT HAND  Final   Special Requests IN PEDIATRIC BOTTLE Blood Culture adequate volume  Final   Culture   Final    NO GROWTH 5 DAYS Performed at [facility], [place] [address], [place], [place] [address]    Report Status 07/06/2017 FINAL  Final  Culture, blood (routine x 2)     Status: None   Collection Time: 07/01/17  2:50 AM  Result Value Ref Range Status   Specimen Description BLOOD RIGHT HAND  Final   Special Requests IN PEDIATRIC BOTTLE Blood Culture adequate volume  Final   Culture   Final    NO GROWTH 5 DAYS Performed at [facility], [place] [address], [place], [place] [address]    Report Status 07/06/2017 FINAL  Final     Labs: Basic Metabolic Panel: Recent Labs  Lab 07/02/17 0300 07/03/17 0305 07/06/17 0846  NA 145 145 142  K 3.6 3.2* 3.6  CL 104 104 101  CO2 '30 31 31  '$ GLUCOSE 119* 108* 88  BUN '14 18 10   '$ CREATININE 0.50 0.51 0.48  CALCIUM 10.0 9.8 9.7   Liver Function Tests: Recent Labs  Lab 07/06/17 0846  AST 18  ALT 17  ALKPHOS 70  BILITOT 0.7  PROT 5.3*  ALBUMIN 3.0*   No results for input(s): LIPASE, AMYLASE in the last 168 hours. No results for input(s): AMMONIA in the last 168 hours. CBC: Recent Labs  Lab 07/06/17 0846  WBC 5.7  HGB 9.4*  HCT 33.0*  MCV 89.2  PLT 259   Cardiac Enzymes: No results for input(s): CKTOTAL, CKMB, CKMBINDEX, TROPONINI in the last 168 hours. BNP: BNP (last 3 results) No results for input(s): BNP in the last 8760 hours.  ProBNP (last 3 results) No results for input(s): PROBNP in the last 8760 hours.  CBG: Recent Labs  Lab 07/07/17 2052 07/08/17 0039 07/08/17 0500 07/08/17 0730 07/08/17 1137  GLUCAP 88 86 81 89 101*     Signed:  Velvet Bathe MD.  [facility] 07/08/2017, 2:59 PM  Pt stable for discharge today 07/08/17

## 2017-07-07 NOTE — Progress Notes (Signed)
CSW following for discharge plan. CSW spoke with patient's daughter, [name], and granddaughter, Deidre Ala earlier today (2:00 PM) to discuss discharge and return to [facility]. Per family, they would rather the patient admit to somewhere else instead. CSW explained that the patient had been discharged, so if there was another facility that can take the patient today, CSW can assist, but if not then the patient would have to return to [facility]. [name] requested that CSW contact the facility in [place] contacted Admissions with [facility]. Admissions at [facility] reviewed the patient's case and contacted insurance; patient has no SNF days and is under private pay. CSW received information on private pay rates for [facility], and can provide to family, but [facility] is unable to take the patient today.   CSW contacted Admissions at [facility] to inform them that the patient was ready to return. CSW spoke with Admissions, who forwarded onto the Director of Nursing because they do not have the staff available on site today to readmit the patient. Patient will need to be seen by an RN and there are no RNs on site for the evening shift today, and the DON is not able to call any in to work the evening. CSW updated MD.  Patient has no other bed offers to admit today.   CSW will continue to follow.  Laveda Abbe, [place] Clinical Social Worker [phone]

## 2017-07-08 DIAGNOSIS — J9621 Acute and chronic respiratory failure with hypoxia: Secondary | ICD-10-CM | POA: Diagnosis not present

## 2017-07-08 DIAGNOSIS — R279 Unspecified lack of coordination: Secondary | ICD-10-CM | POA: Diagnosis not present

## 2017-07-08 DIAGNOSIS — R41841 Cognitive communication deficit: Secondary | ICD-10-CM | POA: Diagnosis not present

## 2017-07-08 DIAGNOSIS — Z1612 Extended spectrum beta lactamase (ESBL) resistance: Secondary | ICD-10-CM | POA: Diagnosis not present

## 2017-07-08 DIAGNOSIS — M6281 Muscle weakness (generalized): Secondary | ICD-10-CM | POA: Diagnosis not present

## 2017-07-08 DIAGNOSIS — B9620 Unspecified Escherichia coli [E. coli] as the cause of diseases classified elsewhere: Secondary | ICD-10-CM | POA: Diagnosis not present

## 2017-07-08 DIAGNOSIS — R262 Difficulty in walking, not elsewhere classified: Secondary | ICD-10-CM | POA: Diagnosis not present

## 2017-07-08 DIAGNOSIS — A4151 Sepsis due to Escherichia coli [E. coli]: Secondary | ICD-10-CM | POA: Diagnosis not present

## 2017-07-08 DIAGNOSIS — I509 Heart failure, unspecified: Secondary | ICD-10-CM | POA: Diagnosis not present

## 2017-07-08 DIAGNOSIS — R569 Unspecified convulsions: Secondary | ICD-10-CM | POA: Diagnosis not present

## 2017-07-08 DIAGNOSIS — N390 Urinary tract infection, site not specified: Secondary | ICD-10-CM | POA: Diagnosis not present

## 2017-07-08 DIAGNOSIS — R1310 Dysphagia, unspecified: Secondary | ICD-10-CM | POA: Diagnosis not present

## 2017-07-08 DIAGNOSIS — I4891 Unspecified atrial fibrillation: Secondary | ICD-10-CM | POA: Diagnosis not present

## 2017-07-08 DIAGNOSIS — R9431 Abnormal electrocardiogram [ECG] [EKG]: Secondary | ICD-10-CM | POA: Diagnosis not present

## 2017-07-08 DIAGNOSIS — J189 Pneumonia, unspecified organism: Secondary | ICD-10-CM | POA: Diagnosis not present

## 2017-07-08 DIAGNOSIS — L89893 Pressure ulcer of other site, stage 3: Secondary | ICD-10-CM | POA: Diagnosis not present

## 2017-07-08 DIAGNOSIS — R5381 Other malaise: Secondary | ICD-10-CM | POA: Diagnosis not present

## 2017-07-08 DIAGNOSIS — R4182 Altered mental status, unspecified: Secondary | ICD-10-CM | POA: Diagnosis not present

## 2017-07-08 DIAGNOSIS — G40901 Epilepsy, unspecified, not intractable, with status epilepticus: Secondary | ICD-10-CM | POA: Diagnosis not present

## 2017-07-08 LAB — GLUCOSE, CAPILLARY
GLUCOSE-CAPILLARY: 89 mg/dL (ref 65–99)
GLUCOSE-CAPILLARY: 101 mg/dL — AB (ref 65–99)
Glucose-Capillary: 81 mg/dL (ref 65–99)
Glucose-Capillary: 86 mg/dL (ref 65–99)

## 2017-07-08 MED ORDER — PRO-STAT SUGAR FREE PO LIQD: 30.0000 mL | Freq: Three times a day (TID) | ORAL | Status: DC

## 2017-07-08 MED ORDER — LISINOPRIL 2.5 MG PO TABS: 2.5000 mg | ORAL_TABLET | Freq: Every day | ORAL | Status: DC

## 2017-07-08 MED ORDER — ADULT MULTIVITAMIN LIQUID CH: 15.0000 mL | Freq: Every day | ORAL | Status: DC

## 2017-07-08 MED ORDER — IPRATROPIUM BROMIDE 0.02 % IN SOLN: 0.5000 mg | Freq: Two times a day (BID) | RESPIRATORY_TRACT | Status: DC

## 2017-07-08 MED ORDER — FUROSEMIDE 40 MG PO TABS: 40.0000 mg | ORAL_TABLET | Freq: Every day | ORAL | Status: DC

## 2017-07-08 MED ORDER — ACETAMINOPHEN 325 MG PO TABS: 650.0000 mg | ORAL_TABLET | Freq: Four times a day (QID) | ORAL | Status: DC | PRN

## 2017-07-08 MED ORDER — AMLODIPINE BESYLATE 10 MG PO TABS: 10.0000 mg | ORAL_TABLET | Freq: Every day | ORAL | Status: DC

## 2017-07-08 MED ORDER — CARVEDILOL 6.25 MG PO TABS: 6.2500 mg | ORAL_TABLET | Freq: Two times a day (BID) | ORAL | Status: DC

## 2017-07-08 MED ORDER — VALPROATE SODIUM 250 MG/5ML PO SOLN: 500.0000 mg | Freq: Two times a day (BID) | ORAL | Status: DC

## 2017-07-08 MED ORDER — PANTOPRAZOLE SODIUM 40 MG PO PACK: 40.0000 mg | PACK | Freq: Every day | ORAL | Status: DC

## 2017-07-08 NOTE — Progress Notes (Signed)
Pt picked up by [facility] to be transported off unit to disposition. Pt transported off unit via stretcher with belongings to the side. Sterile dsg applied to removed line site clean, dry and intact with not stain or active bleeding noted. Delia Heady RN

## 2017-07-08 NOTE — Progress Notes (Addendum)
Discharge to: [facility] [place] Anticipated discharge date: 07/08/17 Family notified: Vilinda Blanks, by phone Transportation by: [facility]  Report #: [phone], Room E7  Transport set up for 1:30 PM.  CSW signing off.  Laveda Abbe LCSW [phone]

## 2017-07-08 NOTE — Progress Notes (Signed)
Report called off to nurse Crystal at the facility. Pt telemetry and IJ removed; pt cleaned up and awaiting on [facility] to transport off to disposition. Will continue to monitor pt till he's picked up. P. Angelica Pou RN

## 2017-07-08 NOTE — Consult Note (Signed)
   [facility] CM Inpatient Consult   07/08/2017  Melissa Cochran [birth year] [id number]  Patient assessed for needs in the [place].  Patient is from a skilled facility and notes reveals patient is to return to [facility] at [place].  Patient's risk score was listed high and assessed for [facility] Care Management and no needs noted.   Sign off.  Natividad Brood, RN BSN [facility] Liaison  [phone] business mobile phone Toll free office [phone]

## 2017-07-08 NOTE — Care Management Note (Signed)
Case Management Note  Patient Details  Name: Melissa Cochran MRN: [id number] Date of Birth: 05/05/1936  Subjective/Objective:                    Action/Plan: Pt discharging back to her SNF. CM signing off.    Expected Discharge Date:  07/07/17               Expected Discharge Plan:  Skilled Nursing Facility  In-House Referral:  Clinical Social Work  Discharge planning Services  CM Consult  Post Acute Care Choice:    Choice offered to:     DME Arranged:    DME Agency:     HH Arranged:    [place] Agency:     Status of Service:  Completed, signed off  If discussed at [employer], dates discussed:    Additional Comments:  Pollie Friar, RN 07/08/2017, 12:27 PM

## 2017-07-08 NOTE — Progress Notes (Signed)
Pt central line removed per order and protocol. Catheter tip intact with no drainage or discharge noted. Pressure applied and clean, dry sterile dsg with Vaseline gauze applied to site. Pt resting comfortably in bed with call light within reach and pt to remain lying in bed for at least 30 mins per protocol. Will closely monitor pt. Delia Heady RN

## 2017-07-11 DIAGNOSIS — I509 Heart failure, unspecified: Secondary | ICD-10-CM | POA: Diagnosis not present

## 2017-07-11 DIAGNOSIS — R5381 Other malaise: Secondary | ICD-10-CM | POA: Diagnosis not present

## 2017-07-11 DIAGNOSIS — R569 Unspecified convulsions: Secondary | ICD-10-CM | POA: Diagnosis not present

## 2017-07-11 DIAGNOSIS — N390 Urinary tract infection, site not specified: Secondary | ICD-10-CM | POA: Diagnosis not present

## 2017-07-11 DIAGNOSIS — Z1612 Extended spectrum beta lactamase (ESBL) resistance: Secondary | ICD-10-CM | POA: Diagnosis not present

## 2017-07-11 DIAGNOSIS — B9620 Unspecified Escherichia coli [E. coli] as the cause of diseases classified elsewhere: Secondary | ICD-10-CM | POA: Diagnosis not present

## 2017-07-14 DIAGNOSIS — L89893 Pressure ulcer of other site, stage 3: Secondary | ICD-10-CM | POA: Diagnosis not present

## 2017-07-25 DIAGNOSIS — I509 Heart failure, unspecified: Secondary | ICD-10-CM | POA: Diagnosis not present

## 2017-07-25 DIAGNOSIS — N390 Urinary tract infection, site not specified: Secondary | ICD-10-CM | POA: Diagnosis not present

## 2017-07-25 DIAGNOSIS — Z1612 Extended spectrum beta lactamase (ESBL) resistance: Secondary | ICD-10-CM | POA: Diagnosis not present

## 2017-07-25 DIAGNOSIS — B9620 Unspecified Escherichia coli [E. coli] as the cause of diseases classified elsewhere: Secondary | ICD-10-CM | POA: Diagnosis not present

## 2017-07-25 DIAGNOSIS — R569 Unspecified convulsions: Secondary | ICD-10-CM | POA: Diagnosis not present

## 2017-07-25 DIAGNOSIS — R5381 Other malaise: Secondary | ICD-10-CM | POA: Diagnosis not present

## 2017-08-10 DIAGNOSIS — I5020 Unspecified systolic (congestive) heart failure: Secondary | ICD-10-CM | POA: Diagnosis not present

## 2017-08-10 DIAGNOSIS — I110 Hypertensive heart disease with heart failure: Secondary | ICD-10-CM | POA: Diagnosis not present

## 2017-08-10 DIAGNOSIS — J449 Chronic obstructive pulmonary disease, unspecified: Secondary | ICD-10-CM | POA: Diagnosis not present

## 2017-08-10 DIAGNOSIS — R569 Unspecified convulsions: Secondary | ICD-10-CM | POA: Diagnosis not present

## 2017-08-10 DIAGNOSIS — I2510 Atherosclerotic heart disease of native coronary artery without angina pectoris: Secondary | ICD-10-CM | POA: Diagnosis not present

## 2017-08-10 DIAGNOSIS — I4891 Unspecified atrial fibrillation: Secondary | ICD-10-CM | POA: Diagnosis not present

## 2017-08-24 ENCOUNTER — Emergency Department
Admission: EM | Admit: 2017-08-24 | Discharge: 2017-08-24 | Disposition: A | Discharge status: Other Acute Inpatient Hospital | Payer: Medicare Other | Attending: Student in an Organized Health Care Education/Training Program | Admitting: Student in an Organized Health Care Education/Training Program

## 2017-08-24 ENCOUNTER — Other Ambulatory Visit: Payer: Self-pay

## 2017-08-24 ENCOUNTER — Emergency Department: Payer: Medicare Other

## 2017-08-24 DIAGNOSIS — T424X5A Adverse effect of benzodiazepines, initial encounter: Secondary | ICD-10-CM | POA: Diagnosis not present

## 2017-08-24 DIAGNOSIS — I252 Old myocardial infarction: Secondary | ICD-10-CM | POA: Diagnosis not present

## 2017-08-24 DIAGNOSIS — R402313 Coma scale, best motor response, none, at hospital admission: Secondary | ICD-10-CM | POA: Diagnosis present

## 2017-08-24 DIAGNOSIS — Z8551 Personal history of malignant neoplasm of bladder: Secondary | ICD-10-CM | POA: Insufficient documentation

## 2017-08-24 DIAGNOSIS — I509 Heart failure, unspecified: Secondary | ICD-10-CM | POA: Insufficient documentation

## 2017-08-24 DIAGNOSIS — Z87891 Personal history of nicotine dependence: Secondary | ICD-10-CM | POA: Diagnosis not present

## 2017-08-24 DIAGNOSIS — Z4682 Encounter for fitting and adjustment of non-vascular catheter: Secondary | ICD-10-CM | POA: Diagnosis not present

## 2017-08-24 DIAGNOSIS — J9602 Acute respiratory failure with hypercapnia: Secondary | ICD-10-CM | POA: Diagnosis not present

## 2017-08-24 DIAGNOSIS — I2510 Atherosclerotic heart disease of native coronary artery without angina pectoris: Secondary | ICD-10-CM | POA: Diagnosis present

## 2017-08-24 DIAGNOSIS — K219 Gastro-esophageal reflux disease without esophagitis: Secondary | ICD-10-CM | POA: Diagnosis not present

## 2017-08-24 DIAGNOSIS — G319 Degenerative disease of nervous system, unspecified: Secondary | ICD-10-CM | POA: Diagnosis present

## 2017-08-24 DIAGNOSIS — J9601 Acute respiratory failure with hypoxia: Secondary | ICD-10-CM | POA: Insufficient documentation

## 2017-08-24 DIAGNOSIS — Z8709 Personal history of other diseases of the respiratory system: Secondary | ICD-10-CM | POA: Diagnosis not present

## 2017-08-24 DIAGNOSIS — G9381 Temporal sclerosis: Secondary | ICD-10-CM | POA: Diagnosis not present

## 2017-08-24 DIAGNOSIS — G9341 Metabolic encephalopathy: Secondary | ICD-10-CM | POA: Diagnosis not present

## 2017-08-24 DIAGNOSIS — G40911 Epilepsy, unspecified, intractable, with status epilepticus: Secondary | ICD-10-CM | POA: Diagnosis present

## 2017-08-24 DIAGNOSIS — A4151 Sepsis due to Escherichia coli [E. coli]: Secondary | ICD-10-CM | POA: Diagnosis not present

## 2017-08-24 DIAGNOSIS — T41295A Adverse effect of other general anesthetics, initial encounter: Secondary | ICD-10-CM | POA: Diagnosis not present

## 2017-08-24 DIAGNOSIS — Z7982 Long term (current) use of aspirin: Secondary | ICD-10-CM | POA: Diagnosis not present

## 2017-08-24 DIAGNOSIS — I10 Essential (primary) hypertension: Secondary | ICD-10-CM | POA: Diagnosis not present

## 2017-08-24 DIAGNOSIS — K209 Esophagitis, unspecified: Secondary | ICD-10-CM | POA: Diagnosis not present

## 2017-08-24 DIAGNOSIS — I4891 Unspecified atrial fibrillation: Secondary | ICD-10-CM | POA: Diagnosis not present

## 2017-08-24 DIAGNOSIS — J449 Chronic obstructive pulmonary disease, unspecified: Secondary | ICD-10-CM | POA: Diagnosis present

## 2017-08-24 DIAGNOSIS — G40901 Epilepsy, unspecified, not intractable, with status epilepticus: Secondary | ICD-10-CM | POA: Diagnosis not present

## 2017-08-24 DIAGNOSIS — N390 Urinary tract infection, site not specified: Secondary | ICD-10-CM | POA: Diagnosis not present

## 2017-08-24 DIAGNOSIS — K2970 Gastritis, unspecified, without bleeding: Secondary | ICD-10-CM | POA: Diagnosis not present

## 2017-08-24 DIAGNOSIS — I952 Hypotension due to drugs: Secondary | ICD-10-CM | POA: Diagnosis not present

## 2017-08-24 DIAGNOSIS — I5032 Chronic diastolic (congestive) heart failure: Secondary | ICD-10-CM | POA: Diagnosis present

## 2017-08-24 DIAGNOSIS — G9340 Encephalopathy, unspecified: Secondary | ICD-10-CM | POA: Diagnosis not present

## 2017-08-24 DIAGNOSIS — D649 Anemia, unspecified: Secondary | ICD-10-CM | POA: Diagnosis not present

## 2017-08-24 DIAGNOSIS — R4182 Altered mental status, unspecified: Secondary | ICD-10-CM | POA: Diagnosis not present

## 2017-08-24 DIAGNOSIS — R402113 Coma scale, eyes open, never, at hospital admission: Secondary | ICD-10-CM | POA: Diagnosis present

## 2017-08-24 DIAGNOSIS — Z8673 Personal history of transient ischemic attack (TIA), and cerebral infarction without residual deficits: Secondary | ICD-10-CM | POA: Diagnosis not present

## 2017-08-24 DIAGNOSIS — K210 Gastro-esophageal reflux disease with esophagitis: Secondary | ICD-10-CM | POA: Diagnosis not present

## 2017-08-24 DIAGNOSIS — I110 Hypertensive heart disease with heart failure: Secondary | ICD-10-CM | POA: Insufficient documentation

## 2017-08-24 DIAGNOSIS — Z7401 Bed confinement status: Secondary | ICD-10-CM | POA: Diagnosis not present

## 2017-08-24 DIAGNOSIS — R402213 Coma scale, best verbal response, none, at hospital admission: Secondary | ICD-10-CM | POA: Diagnosis present

## 2017-08-24 DIAGNOSIS — I639 Cerebral infarction, unspecified: Secondary | ICD-10-CM | POA: Diagnosis not present

## 2017-08-24 DIAGNOSIS — G40409 Other generalized epilepsy and epileptic syndromes, not intractable, without status epilepticus: Secondary | ICD-10-CM | POA: Diagnosis not present

## 2017-08-24 DIAGNOSIS — Z9911 Dependence on respirator [ventilator] status: Secondary | ICD-10-CM | POA: Diagnosis not present

## 2017-08-24 DIAGNOSIS — G969 Disorder of central nervous system, unspecified: Secondary | ICD-10-CM | POA: Diagnosis not present

## 2017-08-24 DIAGNOSIS — M7989 Other specified soft tissue disorders: Secondary | ICD-10-CM | POA: Diagnosis not present

## 2017-08-24 DIAGNOSIS — Z8679 Personal history of other diseases of the circulatory system: Secondary | ICD-10-CM | POA: Diagnosis not present

## 2017-08-24 DIAGNOSIS — J9600 Acute respiratory failure, unspecified whether with hypoxia or hypercapnia: Secondary | ICD-10-CM | POA: Diagnosis not present

## 2017-08-24 DIAGNOSIS — D638 Anemia in other chronic diseases classified elsewhere: Secondary | ICD-10-CM | POA: Diagnosis not present

## 2017-08-24 DIAGNOSIS — N3000 Acute cystitis without hematuria: Secondary | ICD-10-CM | POA: Diagnosis not present

## 2017-08-24 DIAGNOSIS — J9690 Respiratory failure, unspecified, unspecified whether with hypoxia or hypercapnia: Secondary | ICD-10-CM | POA: Diagnosis not present

## 2017-08-24 DIAGNOSIS — E785 Hyperlipidemia, unspecified: Secondary | ICD-10-CM | POA: Diagnosis present

## 2017-08-24 DIAGNOSIS — G9349 Other encephalopathy: Secondary | ICD-10-CM | POA: Diagnosis not present

## 2017-08-24 DIAGNOSIS — G40909 Epilepsy, unspecified, not intractable, without status epilepticus: Secondary | ICD-10-CM | POA: Diagnosis not present

## 2017-08-24 DIAGNOSIS — R569 Unspecified convulsions: Secondary | ICD-10-CM | POA: Diagnosis not present

## 2017-08-24 DIAGNOSIS — I69398 Other sequelae of cerebral infarction: Secondary | ICD-10-CM | POA: Diagnosis not present

## 2017-08-24 DIAGNOSIS — A419 Sepsis, unspecified organism: Secondary | ICD-10-CM | POA: Diagnosis not present

## 2017-08-24 DIAGNOSIS — Z79899 Other long term (current) drug therapy: Secondary | ICD-10-CM | POA: Insufficient documentation

## 2017-08-24 DIAGNOSIS — Z930 Tracheostomy status: Secondary | ICD-10-CM | POA: Diagnosis not present

## 2017-08-24 LAB — CBC WITH DIFFERENTIAL/PLATELET
BASOS ABS: 0 10*3/uL (ref 0–0.1)
BASOS PCT: 0 %
EOS PCT: 3 %
Eosinophils Absolute: 0.1 10*3/uL (ref 0–0.7)
HCT: 38.3 % (ref 35.0–47.0)
Hemoglobin: 12.4 g/dL (ref 12.0–16.0)
Lymphocytes Relative: 28 %
Lymphs Abs: 1.5 10*3/uL (ref 1.0–3.6)
MCH: 26.5 pg (ref 26.0–34.0)
MCHC: 32.3 g/dL (ref 32.0–36.0)
MCV: 82.1 fL (ref 80.0–100.0)
MONO ABS: 0.3 10*3/uL (ref 0.2–0.9)
MONOS PCT: 6 %
Neutro Abs: 3.4 10*3/uL (ref 1.4–6.5)
Neutrophils Relative %: 63 %
PLATELETS: 199 10*3/uL (ref 150–440)
RBC: 4.67 MIL/uL (ref 3.80–5.20)
RDW: 18 % — AB (ref 11.5–14.5)
WBC: 5.4 10*3/uL (ref 3.6–11.0)

## 2017-08-24 LAB — URINALYSIS, COMPLETE (UACMP) WITH MICROSCOPIC
Bacteria, UA: NONE SEEN
Bilirubin Urine: NEGATIVE
GLUCOSE, UA: NEGATIVE mg/dL
Ketones, ur: NEGATIVE mg/dL
NITRITE: NEGATIVE
PH: 6 (ref 5.0–8.0)
Protein, ur: NEGATIVE mg/dL
SPECIFIC GRAVITY, URINE: 1.009 (ref 1.005–1.030)
Squamous Epithelial / LPF: NONE SEEN

## 2017-08-24 LAB — BLOOD GAS, ARTERIAL
ACID-BASE EXCESS: 8.1 mmol/L — AB (ref 0.0–2.0)
BICARBONATE: 29.5 mmol/L — AB (ref 20.0–28.0)
FIO2: 0.5
LHR: 16 {breaths}/min
MECHVT: 450 mL
O2 SAT: 97.5 %
PATIENT TEMPERATURE: 37
PCO2 ART: 30 mmHg — AB (ref 32.0–48.0)
PEEP: 5 cmH2O
PH ART: 7.6 — AB (ref 7.350–7.450)
PO2 ART: 80 mmHg — AB (ref 83.0–108.0)

## 2017-08-24 LAB — COMPREHENSIVE METABOLIC PANEL
ALBUMIN: 3.2 g/dL — AB (ref 3.5–5.0)
ALT: 17 U/L (ref 14–54)
ANION GAP: 7 (ref 5–15)
AST: 28 U/L (ref 15–41)
Alkaline Phosphatase: 87 U/L (ref 38–126)
BILIRUBIN TOTAL: 0.7 mg/dL (ref 0.3–1.2)
BUN: 15 mg/dL (ref 6–20)
CHLORIDE: 99 mmol/L — AB (ref 101–111)
CO2: 31 mmol/L (ref 22–32)
Calcium: 9.6 mg/dL (ref 8.9–10.3)
Creatinine, Ser: 0.66 mg/dL (ref 0.44–1.00)
GFR calc Af Amer: >60 mL/min (ref >60)
GLUCOSE: 120 mg/dL — AB (ref 65–99)
POTASSIUM: 4.2 mmol/L (ref 3.5–5.1)
Sodium: 137 mmol/L (ref 135–145)
Total Protein: 6.3 g/dL — ABNORMAL LOW (ref 6.5–8.1)

## 2017-08-24 LAB — LACTIC ACID, PLASMA
Lactic Acid, Venous: 2.7 mmol/L (ref 0.5–1.9)
Lactic Acid, Venous: 2.6 mmol/L (ref 0.5–1.9)

## 2017-08-24 LAB — TYPE AND SCREEN
ABO/RH(D): A NEG
Antibody Screen: NEGATIVE

## 2017-08-24 LAB — TROPONIN I

## 2017-08-24 LAB — PREPARE RBC (CROSSMATCH)

## 2017-08-24 LAB — GLUCOSE, CAPILLARY: Glucose-Capillary: 131 mg/dL — ABNORMAL HIGH (ref 65–99)

## 2017-08-24 MED ORDER — SODIUM CHLORIDE 0.9 % IV BOLUS
1000.0000 mL | Freq: Once | INTRAVENOUS | Status: AC
  Administered 2017-08-24: 1000 mL via INTRAVENOUS

## 2017-08-24 MED ORDER — SODIUM CHLORIDE 0.9 % IV SOLN: 10.0000 mL/h | Freq: Once | INTRAVENOUS | Status: DC

## 2017-08-24 MED ORDER — ROCURONIUM BROMIDE 50 MG/5ML IV SOLN
100.0000 mg | Freq: Once | INTRAVENOUS | Status: AC
  Administered 2017-08-24: 100 mg via INTRAVENOUS
  Filled 2017-08-24: qty 10

## 2017-08-24 MED ORDER — SODIUM CHLORIDE 0.9 % IV SOLN
0.5000 mg/h | INTRAVENOUS | Status: DC
  Administered 2017-08-24: 0.5 mg/h via INTRAVENOUS
  Filled 2017-08-24: qty 10

## 2017-08-24 MED ORDER — PROPOFOL 1000 MG/100ML IV EMUL
5.0000 ug/kg/min | Freq: Once | INTRAVENOUS | Status: AC
  Administered 2017-08-24: 5 ug/kg/min via INTRAVENOUS
  Filled 2017-08-24: qty 100

## 2017-08-24 MED ORDER — SODIUM CHLORIDE 0.9 % IV BOLUS
500.0000 mL | Freq: Once | INTRAVENOUS | Status: AC
  Administered 2017-08-24: 500 mL via INTRAVENOUS

## 2017-08-24 MED ORDER — MIDAZOLAM HCL 5 MG/5ML IJ SOLN: 2.0000 mg | Freq: Once | INTRAMUSCULAR | Status: DC

## 2017-08-24 MED ORDER — SODIUM CHLORIDE 0.9 % IV SOLN
1500.0000 mg | Freq: Once | INTRAVENOUS | Status: AC
  Administered 2017-08-24: 1500 mg via INTRAVENOUS
  Filled 2017-08-24: qty 15

## 2017-08-24 MED ORDER — VALPROATE SODIUM 500 MG/5ML IV SOLN
20.0000 mg/kg | Freq: Once | INTRAVENOUS | Status: AC
  Administered 2017-08-24: 1180 mg via INTRAVENOUS
  Filled 2017-08-24: qty 11.8

## 2017-08-24 MED ORDER — ETOMIDATE 2 MG/ML IV SOLN
20.0000 mg | Freq: Once | INTRAVENOUS | Status: AC
  Administered 2017-08-24: 20 mg via INTRAVENOUS

## 2017-08-24 MED ORDER — SODIUM CHLORIDE 0.9 % IV SOLN
1000.0000 mL | INTRAVENOUS | Status: DC
  Administered 2017-08-24: 1000 mL via INTRAVENOUS

## 2017-08-24 MED ORDER — MIDAZOLAM HCL 5 MG/5ML IJ SOLN
2.0000 mg | Freq: Once | INTRAMUSCULAR | Status: AC
  Administered 2017-08-24: 2 mg via INTRAVENOUS

## 2017-08-24 MED ORDER — FENTANYL 2500MCG IN NS 250ML (10MCG/ML) PREMIX INFUSION
30.0000 ug/h | INTRAVENOUS | Status: DC
  Administered 2017-08-24: 30 ug/h via INTRAVENOUS
  Filled 2017-08-24: qty 250

## 2017-08-24 NOTE — ED Notes (Addendum)
On intermittent suction, gastric appearing contents in suction canister.

## 2017-08-24 NOTE — Progress Notes (Signed)
RN called pharmacy to request Versed infusion. RN informed we need an order to dispense drugs.

## 2017-08-24 NOTE — ED Notes (Signed)
Hemoglobin 3.6 hematocrit 11.7 to Dr. Merlyn Lot in person.

## 2017-08-24 NOTE — ED Notes (Signed)
Report to Lonn Georgia, Therapist, sports at [facility]

## 2017-08-24 NOTE — ED Notes (Signed)
Back from CT with this RN

## 2017-08-24 NOTE — ED Notes (Signed)
2mg  versed given IV at this time

## 2017-08-24 NOTE — ED Notes (Signed)
EDP aware of rising blood pressure at this time, verbal orders to increase propofol to 19mcg/kg/min

## 2017-08-24 NOTE — ED Triage Notes (Signed)
To ER via [facility] from [place] [facility] after seizure. Pt seizing on way to ER, 2mg  versed IV with EMS through 22L wrist. CBG 126. Pt nonverbal, does not follow commands. 85% on 15L NRB. ED at bedside. Pt having twitching like motion. Unable to protect airway at this time.

## 2017-08-24 NOTE — ED Notes (Addendum)
EDP informed of patient blood pressure trending down. Verbal orders received for NS and to decrease versed drip. Recheck BP after bolus

## 2017-08-24 NOTE — ED Notes (Addendum)
Intubated, positive color change. 22 at lip 7.5

## 2017-08-24 NOTE — ED Notes (Signed)
Lactic 2.7 verbal in person Dr. Merlyn Lot

## 2017-08-24 NOTE — Progress Notes (Signed)
Chaplain was rounding  And was informed of the Pt. Ch. Stood out and prayed for Pt and staff. No family at this time . Will follow up.   08/24/17 1300  Clinical Encounter Type  Visited With Patient  Visit Type Initial;Critical Care  Referral From Nurse  Spiritual Encounters  Spiritual Needs Prayer

## 2017-08-24 NOTE — ED Notes (Addendum)
Pt suctioned, large amount of oral secretions  OG tube on intermittent suction/

## 2017-08-24 NOTE — ED Notes (Signed)
Melissa Cochran at bedside, daughter

## 2017-08-24 NOTE — ED Provider Notes (Signed)
[facility] Emergency Department Provider Note    First MD Initiated Contact with Patient 08/24/17 1336     (approximate)  I have reviewed the triage vital signs and the nursing notes.   HISTORY  Chief Complaint Seizures  Level v caveat:  AMS - status epilepticus  HPI Melissa Cochran is a 82 y.o. female history of CHF COPD as well as seizure disorder presents after witnessed seizure activity that happened while she was at her wrist [place].  Patient had generalized tonic-clonic shaking which she has had in the past.  EMS was called and arrived to find her with persistent shaking.  She was given 4 mg of IV Versed with no improvemennt.  No report of any trauma or fall.  No report of fevers.  Patient with recent complex hospitalization for similar symptoms and found to have ESBL urosepsis and required prolonged ventilator support.  Past Medical History:  Diagnosis Date  . Adnexal mass   . Anxiety   . Arthritis   . Bladder tumor   . Bleeding ulcer   . CHF (congestive heart failure) ([place])   . CHF (congestive heart failure) ([place])   . Chronic bronchitis ([place])   . COPD (chronic obstructive pulmonary disease) ([place])   . Coronary artery disease   . DVT (deep venous thrombosis) (H[facility])    RLE  . GERD (gastroesophageal reflux disease)   . Heart block    left  . History of bleeding ulcers   . History of kidney stones   . Hyperlipemia   . Hypertension   . Myocardial infarction ([place])    "slight one" (07/05/2017)  . Pneumonia    "several times" (07/05/2017)  . Seizures ([place]) 02/2017 "several"; 06/26/2017 X 1  . Shortness of breath dyspnea   . Stroke [facility]) 02/13/2018   "still right sided weakness" (07/05/2017)   Family History  Problem Relation Age of Onset  . Stomach cancer Mother   . CVA Father   . Bladder Cancer Neg Hx   . Kidney cancer Neg Hx    Past Surgical History:  Procedure Laterality Date  . ABDOMINAL HYSTERECTOMY    . CARDIAC  CATHETERIZATION Left 10/21/2015   Procedure: Left Heart Cath and Coronary Angiography;  Surgeon: Isaias Cowman, MD;  Location: [place] [facility];  Service: Cardiovascular;  Laterality: Left;  . CARDIAC CATHETERIZATION N/A 10/21/2015   Procedure: Coronary Stent Intervention;  Surgeon: Isaias Cowman, MD;  Location: [facility];  Service: Cardiovascular;  Laterality: N/A;  . CARDIAC CATHETERIZATION    . CYSTOSCOPY W/ RETROGRADES Bilateral 01/20/2016   Procedure: CYSTOSCOPY WITH RETROGRADE PYELOGRAM;  Surgeon: Hollice Espy, MD;  Location: [facility];  Service: Urology;  Laterality: Bilateral;  . CYSTOSCOPY WITH STENT PLACEMENT Right 01/20/2016   Procedure: CYSTOSCOPY WITH STENT PLACEMENT;  Surgeon: Hollice Espy, MD;  Location: [facility];  Service: Urology;  Laterality: Right;  . HIP FRACTURE SURGERY Left   . STOMACH SURGERY     "stomach ulcers"  . TRACHEOSTOMY TUBE PLACEMENT N/A 03/02/2017   Procedure: TRACHEOSTOMY;  Surgeon: Rozetta Nunnery, MD;  Location: [place];  Service: ENT;  Laterality: N/A;  . TRANSURETHRAL RESECTION OF BLADDER TUMOR N/A 01/20/2016   Procedure: TRANSURETHRAL RESECTION OF BLADDER TUMOR (TURBT);  Surgeon: Hollice Espy, MD;  Location: [facility];  Service: Urology;  Laterality: N/A;   Patient Active Problem List   Diagnosis Date Noted  . E-coli UTI, ESBL  06/30/2017  . A-fib ([place]) 06/30/2017  .  Need for protective airway ventilation 06/30/2017  . Seizures ([place]) 06/26/2017  . Acute renal failure ([place])   . Elevated troponin I level   . Encephalitis   . Acute renal failure with tubular necrosis ([place])   . Palliative care encounter   . Pressure injury of skin 02/17/2017  . Convulsive status epilepticus ([place]) 02/13/2017  . Bilateral lower extremity edema 10/08/2016  . Atherosclerosis of both lower extremities with bilateral ulceration of midfeet ([place]) 08/24/2016  . Venous ulcer of ankle ([place]) 07/08/2016  . HTN (hypertension), malignant 07/08/2016   . DVT (deep venous thrombosis) ([place]) 07/02/2016  . Swelling of limb 06/08/2016  . Pain in limb 06/08/2016  . Altered mental status 01/22/2016  . S/P coronary artery stent placement 10/30/2015  . Atherosclerotic heart disease of native coronary artery without angina pectoris 10/21/2015  . Preop cardiovascular exam 10/08/2015  . SOB (shortness of breath) on exertion 10/08/2015  . Malignant neoplasm of trigone of bladder ([place]) 09/29/2015  . Mass of lower lobe of left lung, incidetnal on CT 09/2015, smoker.  09/29/2015  . Other disorders of lung 09/29/2015  . Other microscopic hematuria 08/26/2015  . Kidney stone 08/26/2015  . Urge incontinence 08/26/2015  . Pneumonia 01/24/2015  . PNA (pneumonia) 01/24/2015  . Venous insufficiency of both lower extremities 12/09/2014  . Neuropathy 12/09/2014  . Essential (primary) hypertension 12/09/2014  . Polyneuropathy 12/09/2014  . Chronic venous insufficiency 12/09/2014  . Adnexal mass 11/28/2013  . History of cardiac catheterization 11/28/2013  . BP (high blood pressure) 11/28/2013  . Hyperlipidemia 11/28/2013  . Other specified postprocedural states 11/28/2013      Prior to Admission medications   Medication Sig Start Date End Date Taking? Authorizing Provider  acetaminophen (TYLENOL) 325 MG tablet Take 2 tablets (650 mg total) by mouth every 6 (six) hours as needed for mild pain (or Fever >/= 101). 07/08/17  Yes Velvet Bathe, MD  albuterol (PROVENTIL) (2.5 MG/3ML) 0.083% nebulizer solution Take 3 mLs (2.5 mg total) by nebulization every 2 (two) hours as needed for wheezing. 02/22/17  Yes Mikael Spray, NP  amLODipine (NORVASC) 10 MG tablet Take 1 tablet (10 mg total) by mouth daily. 07/08/17  Yes Velvet Bathe, MD  carvedilol (COREG) 6.25 MG tablet Take 1 tablet (6.25 mg total) by mouth 2 (two) times daily with a meal. 07/08/17  Yes Velvet Bathe, MD  furosemide (LASIX) 40 MG tablet Take 1 tablet (40 mg total) by mouth daily. 07/08/17  Yes  Velvet Bathe, MD  levETIRAcetam (KEPPRA) 500 MG tablet Take 1,000 mg by mouth 2 (two) times daily.   Yes [provider]  lisinopril (PRINIVIL,ZESTRIL) 2.5 MG tablet Take 1 tablet (2.5 mg total) by mouth daily. 07/08/17  Yes Velvet Bathe, MD  Multiple Vitamin (THEREMS) TABS Take 1 tablet by mouth daily.   Yes [provider]  pantoprazole (PROTONIX) 40 MG tablet Take 40 mg by mouth daily.   Yes [provider]  valproic acid (DEPAKENE) 250 MG capsule Take 500 mg by mouth 2 (two) times daily.   Yes [provider]  Amino Acids-Protein Hydrolys (FEEDING SUPPLEMENT, PRO-STAT SUGAR FREE 64,) LIQD Take 30 mLs by mouth 3 (three) times daily. Patient not taking: Reported on 08/24/2017 07/08/17   Velvet Bathe, MD  levETIRAcetam (KEPPRA) 100 MG/ML solution Take 10 mLs (1,000 mg total) by mouth 2 (two) times daily. Patient not taking: Reported on 08/24/2017 07/07/17   Velvet Bathe, MD  Multiple Vitamin (MULTIVITAMIN) LIQD Take 15 mLs by mouth daily.  Patient not taking: Reported on 08/24/2017 07/08/17   Velvet Bathe, MD  pantoprazole sodium (PROTONIX) 40 mg/20 mL PACK Take 20 mLs (40 mg total) by mouth daily. Patient not taking: Reported on 08/24/2017 07/08/17   Velvet Bathe, MD  Valproate Sodium (DEPAKENE) 250 MG/5ML SOLN solution Take 10 mLs (500 mg total) by mouth 2 (two) times daily. Patient not taking: Reported on 08/24/2017 07/08/17   Velvet Bathe, MD    Allergies Patient has no known allergies.    Social History Social History   Tobacco Use  . Smoking status: Former Smoker    Packs/day: 1.00    Years: 40.00    Pack years: 40.00    Last attempt to quit: 06/13/1998    Years since quitting: 19.2  . Smokeless tobacco: Never Used  Substance Use Topics  . Alcohol use: No  . Drug use: No    Review of Systems Patient denies headaches, rhinorrhea, blurry vision, numbness, shortness of breath, chest pain, edema, cough, abdominal pain, nausea, vomiting, diarrhea,  dysuria, fevers, rashes or hallucinations unless otherwise stated above in HPI. ____________________________________________   PHYSICAL EXAM:  VITAL SIGNS: Vitals:   08/24/17 1840 08/24/17 1845  BP: (!) 156/97 (!) 163/99  Pulse: 90 93  Resp: (!) 21 (!) 21  Temp:    SpO2: 100% 100%    Constitutional: Unresponsive with generalized clonic shaking movements with leftward gaze preference.  eyes: Conjunctivae are normal.  Head: Atraumatic. Nose: No congestion/rhinnorhea. Mouth/Throat: Mucous membranes are moist.   Neck: No stridor. Painless ROM.  Cardiovascular: Tachycardic regular rhythm. Grossly normal heart sounds.  Good peripheral circulation. Respiratory: Very tachypneic taking short shallow breaths and rhythmic motion, not protecting her airway gastrointestinal: Soft and nontender. No distention. No abdominal bruits. No CVA tenderness. Genitourinary: normal external genitalia Musculoskeletal: No lower extremity tenderness nor edema.  No joint effusions. Neurologic: Unresponsive with clonic rhythmic seizure-like activity as described above.  Skin:  Skin is warm, dry and intact. No rash noted.  ____________________________________________   LABS (all labs ordered are listed, but only abnormal results are displayed)  Results for orders placed or performed during the hospital encounter of 08/24/17 (from the past 24 hour(s))  Glucose, capillary     Status: Abnormal   Collection Time: 08/24/17  1:38 PM  Result Value Ref Range   Glucose-Capillary 131 (H) 65 - 99 mg/dL  Lactic acid, plasma     Status: Abnormal   Collection Time: 08/24/17  1:39 PM  Result Value Ref Range   Lactic Acid, Venous 2.7 (HH) 0.5 - 1.9 mmol/L  CBC WITH DIFFERENTIAL     Status: None   Collection Time: 08/24/17  1:39 PM  Result Value Ref Range   WBC Results consistent with contamination. 3.6 - 11.0 K/uL   RBC Results consistent with contamination. 3.80 - 5.20 MIL/uL   Hemoglobin Results consistent with  contamination. 12.0 - 16.0 g/dL   HCT Results consistent with contamination. 35.0 - 47.0 %   MCV Results consistent with contamination. 80.0 - 100.0 fL   MCH Results consistent with contamination. 26.0 - 34.0 pg   MCHC Results consistent with contamination. 32.0 - 36.0 g/dL   RDW Results consistent with contamination. 11.5 - 14.5 %   Platelets Results consistent with contamination. 150 - 440 K/uL   Neutrophils Relative % Results consistent with contamination. %   Neutro Abs Results consistent with contamination. 1.4 - 6.5 K/uL   Lymphocytes Relative Results consistent with contamination. %   Lymphs Abs Results consistent with contamination.  1.0 - 3.6 K/uL   Monocytes Relative Results consistent with contamination. %   Monocytes Absolute Results consistent with contamination. 0.2 - 0.9 K/uL   Eosinophils Relative Results consistent with contamination. %   Eosinophils Absolute Results consistent with contamination. 0 - 0.7 K/uL   Basophils Relative Results consistent with contamination. %   Basophils Absolute Results consistent with contamination. 0 - 0.1 K/uL  Protime-INR     Status: Abnormal   Collection Time: 08/24/17  1:39 PM  Result Value Ref Range   Prothrombin Time 30.4 (H) 11.4 - 15.2 seconds   INR 2.94   Valproic acid level     Status: Abnormal   Collection Time: 08/24/17  1:39 PM  Result Value Ref Range   Valproic Acid Lvl <10 (L) 50.0 - 100.0 ug/mL  Urinalysis, Complete w Microscopic     Status: Abnormal   Collection Time: 08/24/17  1:40 PM  Result Value Ref Range   Color, Urine YELLOW (A) YELLOW   APPearance HAZY (A) CLEAR   Specific Gravity, Urine 1.009 1.005 - 1.030   pH 6.0 5.0 - 8.0   Glucose, UA NEGATIVE NEGATIVE mg/dL   Hgb urine dipstick SMALL (A) NEGATIVE   Bilirubin Urine NEGATIVE NEGATIVE   Ketones, ur NEGATIVE NEGATIVE mg/dL   Protein, ur NEGATIVE NEGATIVE mg/dL   Nitrite NEGATIVE NEGATIVE   Leukocytes, UA MODERATE (A) NEGATIVE   RBC / HPF 6-30 0 - 5  RBC/hpf   WBC, UA TOO NUMEROUS TO COUNT 0 - 5 WBC/hpf   Bacteria, UA NONE SEEN NONE SEEN   Squamous Epithelial / LPF NONE SEEN NONE SEEN   WBC Clumps PRESENT    Mucus PRESENT    Hyaline Casts, UA PRESENT   Prepare RBC     Status: None   Collection Time: 08/24/17  2:03 PM  Result Value Ref Range   Order Confirmation      ORDER PROCESSED BY BLOOD BANK Performed at [facility], [address] [place], [place] [address]   CBC with Differential/Platelet     Status: Abnormal   Collection Time: 08/24/17  2:18 PM  Result Value Ref Range   WBC 5.4 3.6 - 11.0 K/uL   RBC 4.67 3.80 - 5.20 MIL/uL   Hemoglobin 12.4 12.0 - 16.0 g/dL   HCT 38.3 35.0 - 47.0 %   MCV 82.1 80.0 - 100.0 fL   MCH 26.5 26.0 - 34.0 pg   MCHC 32.3 32.0 - 36.0 g/dL   RDW 18.0 (H) 11.5 - 14.5 %   Platelets 199 150 - 440 K/uL   Neutrophils Relative % 63 %   Neutro Abs 3.4 1.4 - 6.5 K/uL   Lymphocytes Relative 28 %   Lymphs Abs 1.5 1.0 - 3.6 K/uL   Monocytes Relative 6 %   Monocytes Absolute 0.3 0.2 - 0.9 K/uL   Eosinophils Relative 3 %   Eosinophils Absolute 0.1 0 - 0.7 K/uL   Basophils Relative 0 %   Basophils Absolute 0.0 0 - 0.1 K/uL  Comprehensive metabolic panel     Status: Abnormal   Collection Time: 08/24/17  2:18 PM  Result Value Ref Range   Sodium 137 135 - 145 mmol/L   Potassium 4.2 3.5 - 5.1 mmol/L   Chloride 99 (L) 101 - 111 mmol/L   CO2 31 22 - 32 mmol/L   Glucose, Bld 120 (H) 65 - 99 mg/dL   BUN 15 6 - 20 mg/dL   Creatinine, Ser 0.66 0.44 - 1.00 mg/dL  Calcium 9.6 8.9 - 10.3 mg/dL   Total Protein 6.3 (L) 6.5 - 8.1 g/dL   Albumin 3.2 (L) 3.5 - 5.0 g/dL   AST 28 15 - 41 U/L   ALT 17 14 - 54 U/L   Alkaline Phosphatase 87 38 - 126 U/L   Total Bilirubin 0.7 0.3 - 1.2 mg/dL   GFR calc non Af Amer >60 >60 mL/min   GFR calc Af Amer >60 >60 mL/min   Anion gap 7 5 - 15  Troponin I     Status: None   Collection Time: 08/24/17  2:18 PM  Result Value Ref Range   Troponin I <0.03 <0.03  ng/mL  Blood gas, arterial ([facility] & [facility] ONLY)     Status: Abnormal   Collection Time: 08/24/17  2:20 PM  Result Value Ref Range   FIO2 0.50    Mode PRESSURE REGULATED VOLUME CONTROL    VT 450 mL   LHR 16 resp/min   Peep/cpap 5.0 cm H20   pH, Arterial 7.60 (H) 7.350 - 7.450   pCO2 arterial 30 (L) 32.0 - 48.0 mmHg   pO2, Arterial 80 (L) 83.0 - 108.0 mmHg   Bicarbonate 29.5 (H) 20.0 - 28.0 mmol/L   Acid-Base Excess 8.1 (H) 0.0 - 2.0 mmol/L   O2 Saturation 97.5 %   Patient temperature 37.0    Collection site RIGHT RADIAL    Sample type ARTERIAL DRAW    Allens test (pass/fail) PASS PASS  Type and screen [facility]     Status: None   Collection Time: 08/24/17  2:26 PM  Result Value Ref Range   ABO/RH(D) A NEG    Antibody Screen NEG    Sample Expiration      08/27/2017 Performed at [facility], [address] [place], [place] [address]    ____________________________________________  EKG My review and personal interpretation at Time: 13:40   Indication: ams  Rate: 110  Rhythm: afib Axis: normal Other: nonspeicifc st abn, no stemi, ____________________________________________  RADIOLOGY  I personally reviewed all radiographic images ordered to evaluate for the above acute complaints and reviewed radiology reports and findings.  These findings were personally discussed with the patient.  Please see medical record for radiology report.  ____________________________________________   PROCEDURES  Procedure(s) performed:  Procedure Name: Intubation Date/Time: 08/24/2017 4:06 PM Performed by: Merlyn Lot, MD Pre-anesthesia Checklist: Patient identified, Emergency Drugs available, Suction available and Patient being monitored Preoxygenation: Pre-oxygenation with 100% oxygen Induction Type: IV induction and Rapid sequence Laryngoscope Size: Glidescope and 3 Grade View: Grade II Tube size: 7.5 mm Number of attempts: 1 Airway Equipment and  Method: Video-laryngoscopy Placement Confirmation: ETT inserted through vocal cords under direct vision,  CO2 detector and Breath sounds checked- equal and bilateral Dental Injury: Teeth and Oropharynx as per pre-operative assessment     .Critical Care Performed by: Merlyn Lot, MD Authorized by: Merlyn Lot, MD   Critical care provider statement:    Critical care time (minutes):  50   Critical care time was exclusive of:  Separately billable procedures and treating other patients   Critical care was time spent personally by me on the following activities:  Development of treatment plan with patient or surrogate, discussions with consultants, evaluation of patient's response to treatment, examination of patient, obtaining history from patient or surrogate, ordering and performing treatments and interventions, ordering and review of laboratory studies, ordering and review of radiographic studies, pulse oximetry, re-evaluation of patient's condition and review of  old charts      Critical Care performed: yes ____________________________________________   INITIAL IMPRESSION / ASSESSMENT AND PLAN / ED COURSE  Pertinent labs & imaging results that were available during my care of the patient were reviewed by me and considered in my medical decision making (see chart for details).  DDX: status epilepticus, Dehydration, sepsis, pna, uti, hypoglycemia, cva, drug effect, withdrawal, encephalitis   Melissa Cochran is a 82 y.o. who presents to the ED with status epilepticus requiring emergent intubation upon arrival to the ER.  Blood work sent for the above differential.  Sugar is 130.  The patient will be placed on continuous pulse oximetry and telemetry for monitoring.  Laboratory evaluation will be sent to evaluate for the above complaints.     Clinical Course as of Aug 24 1853  Wed Aug 24, 2017  1403 Neutrophils: 49 [PR]  [facility] and they are currently on  diversion not excepting any ICU patients.  Currently waiting on recheck labs as they are not convinced that this is an true hemoglobin therefore prior transfusion will await repeat.   [PR]  1437 Repeat CBC shows normal hemoglobin and therefore our initial one is erroneous.  We will hold on transfusion.   [PR]  1440 Elevated lactate likely secondary to increased metabolic demands given status epilepticus.  Will give loading dose of about depakote as well.   [PR]  1453 Pressure persistently elevated.  Will also add on IV propofol for additional seizure suppression   [PR]    Clinical Course User Index [PR] Merlyn Lot, MD   ----------------------------------------- 6:54 PM on 08/24/2017 -----------------------------------------  Patient reassessed remains him dynamically stable but critically ill prior to transfer.  Does not have any evidence of clinical seizures at this time remains on propofol, Versed, fentanyl drips.  As part of my medical decision making, I reviewed the following data within the [facility] notes reviewed and incorporated, Labs reviewed, notes.   ____________________________________________   FINAL CLINICAL IMPRESSION(S) / ED DIAGNOSES  Final diagnoses:  Status epilepticus ([place])  Acute respiratory failure with hypoxia ([facility])      NEW MEDICATIONS STARTED DURING THIS VISIT:  New Prescriptions   No medications on file     Note:  This document was prepared using Dragon voice recognition software and may include unintentional dictation errors.    Merlyn Lot, MD 08/24/17 [phone]

## 2017-08-24 NOTE — ED Notes (Addendum)
NS infusing at this time. 20mg  etomidate via IV by Helene Kelp, and 100mg  rocuronium via IV by Helene Kelp RN verbal order Dr. Merlyn Lot.

## 2017-08-24 NOTE — ED Notes (Addendum)
Pt suctioned by RT Large amount of clear secretions

## 2017-08-24 NOTE — ED Notes (Signed)
Family at bedside. 

## 2017-08-24 NOTE — ED Notes (Signed)
Report to [employer], Robin.

## 2017-08-25 LAB — URINE CULTURE: Culture: NO GROWTH

## 2017-08-29 LAB — CULTURE, BLOOD (ROUTINE X 2)
CULTURE: NO GROWTH
CULTURE: NO GROWTH
SPECIAL REQUESTS: ADEQUATE
Special Requests: ADEQUATE

## 2017-09-12 DIAGNOSIS — R05 Cough: Secondary | ICD-10-CM | POA: Diagnosis not present

## 2017-09-12 DIAGNOSIS — R570 Cardiogenic shock: Secondary | ICD-10-CM | POA: Diagnosis not present

## 2017-09-12 DIAGNOSIS — L259 Unspecified contact dermatitis, unspecified cause: Secondary | ICD-10-CM | POA: Diagnosis not present

## 2017-09-12 DIAGNOSIS — A419 Sepsis, unspecified organism: Secondary | ICD-10-CM | POA: Diagnosis not present

## 2017-09-12 DIAGNOSIS — J9620 Acute and chronic respiratory failure, unspecified whether with hypoxia or hypercapnia: Secondary | ICD-10-CM | POA: Diagnosis not present

## 2017-09-12 DIAGNOSIS — Z87891 Personal history of nicotine dependence: Secondary | ICD-10-CM | POA: Diagnosis not present

## 2017-09-12 DIAGNOSIS — L89153 Pressure ulcer of sacral region, stage 3: Secondary | ICD-10-CM | POA: Diagnosis present

## 2017-09-12 DIAGNOSIS — R569 Unspecified convulsions: Secondary | ICD-10-CM | POA: Diagnosis not present

## 2017-09-12 DIAGNOSIS — L98499 Non-pressure chronic ulcer of skin of other sites with unspecified severity: Secondary | ICD-10-CM | POA: Diagnosis not present

## 2017-09-12 DIAGNOSIS — G40909 Epilepsy, unspecified, not intractable, without status epilepticus: Secondary | ICD-10-CM | POA: Diagnosis not present

## 2017-09-12 DIAGNOSIS — R627 Adult failure to thrive: Secondary | ICD-10-CM | POA: Diagnosis not present

## 2017-09-12 DIAGNOSIS — I469 Cardiac arrest, cause unspecified: Secondary | ICD-10-CM | POA: Diagnosis not present

## 2017-09-12 DIAGNOSIS — I509 Heart failure, unspecified: Secondary | ICD-10-CM | POA: Diagnosis not present

## 2017-09-12 DIAGNOSIS — Z430 Encounter for attention to tracheostomy: Secondary | ICD-10-CM | POA: Diagnosis not present

## 2017-09-12 DIAGNOSIS — N390 Urinary tract infection, site not specified: Secondary | ICD-10-CM | POA: Diagnosis not present

## 2017-09-12 DIAGNOSIS — M436 Torticollis: Secondary | ICD-10-CM | POA: Diagnosis not present

## 2017-09-12 DIAGNOSIS — G40309 Generalized idiopathic epilepsy and epileptic syndromes, not intractable, without status epilepticus: Secondary | ICD-10-CM | POA: Diagnosis not present

## 2017-09-12 DIAGNOSIS — J449 Chronic obstructive pulmonary disease, unspecified: Secondary | ICD-10-CM | POA: Diagnosis present

## 2017-09-12 DIAGNOSIS — L89102 Pressure ulcer of unspecified part of back, stage 2: Secondary | ICD-10-CM | POA: Diagnosis present

## 2017-09-12 DIAGNOSIS — J90 Pleural effusion, not elsewhere classified: Secondary | ICD-10-CM | POA: Diagnosis not present

## 2017-09-12 DIAGNOSIS — Z431 Encounter for attention to gastrostomy: Secondary | ICD-10-CM | POA: Diagnosis not present

## 2017-09-12 DIAGNOSIS — J181 Lobar pneumonia, unspecified organism: Secondary | ICD-10-CM | POA: Diagnosis not present

## 2017-09-12 DIAGNOSIS — J690 Pneumonitis due to inhalation of food and vomit: Secondary | ICD-10-CM | POA: Diagnosis not present

## 2017-09-12 DIAGNOSIS — R197 Diarrhea, unspecified: Secondary | ICD-10-CM | POA: Diagnosis not present

## 2017-09-12 DIAGNOSIS — K219 Gastro-esophageal reflux disease without esophagitis: Secondary | ICD-10-CM | POA: Diagnosis present

## 2017-09-12 DIAGNOSIS — R531 Weakness: Secondary | ICD-10-CM | POA: Diagnosis not present

## 2017-09-12 DIAGNOSIS — J9602 Acute respiratory failure with hypercapnia: Secondary | ICD-10-CM | POA: Diagnosis not present

## 2017-09-12 DIAGNOSIS — I110 Hypertensive heart disease with heart failure: Secondary | ICD-10-CM | POA: Diagnosis not present

## 2017-09-12 DIAGNOSIS — L89150 Pressure ulcer of sacral region, unstageable: Secondary | ICD-10-CM | POA: Diagnosis not present

## 2017-09-12 DIAGNOSIS — J9691 Respiratory failure, unspecified with hypoxia: Secondary | ICD-10-CM | POA: Diagnosis not present

## 2017-09-12 DIAGNOSIS — R509 Fever, unspecified: Secondary | ICD-10-CM | POA: Diagnosis not present

## 2017-09-12 DIAGNOSIS — Z8673 Personal history of transient ischemic attack (TIA), and cerebral infarction without residual deficits: Secondary | ICD-10-CM | POA: Diagnosis not present

## 2017-09-12 DIAGNOSIS — Z789 Other specified health status: Secondary | ICD-10-CM | POA: Diagnosis not present

## 2017-09-12 DIAGNOSIS — N170 Acute kidney failure with tubular necrosis: Secondary | ICD-10-CM | POA: Diagnosis not present

## 2017-09-12 DIAGNOSIS — R29898 Other symptoms and signs involving the musculoskeletal system: Secondary | ICD-10-CM | POA: Diagnosis not present

## 2017-09-12 DIAGNOSIS — G40901 Epilepsy, unspecified, not intractable, with status epilepticus: Secondary | ICD-10-CM | POA: Diagnosis not present

## 2017-09-12 DIAGNOSIS — Z930 Tracheostomy status: Secondary | ICD-10-CM | POA: Diagnosis not present

## 2017-09-12 DIAGNOSIS — J9621 Acute and chronic respiratory failure with hypoxia: Secondary | ICD-10-CM | POA: Diagnosis not present

## 2017-09-12 DIAGNOSIS — R401 Stupor: Secondary | ICD-10-CM | POA: Diagnosis not present

## 2017-09-12 DIAGNOSIS — Z451 Encounter for adjustment and management of infusion pump: Secondary | ICD-10-CM | POA: Diagnosis not present

## 2017-09-12 DIAGNOSIS — R6251 Failure to thrive (child): Secondary | ICD-10-CM | POA: Diagnosis not present

## 2017-09-12 DIAGNOSIS — I480 Paroxysmal atrial fibrillation: Secondary | ICD-10-CM | POA: Diagnosis not present

## 2017-09-12 DIAGNOSIS — D72829 Elevated white blood cell count, unspecified: Secondary | ICD-10-CM | POA: Diagnosis not present

## 2017-09-12 DIAGNOSIS — R579 Shock, unspecified: Secondary | ICD-10-CM | POA: Diagnosis not present

## 2017-09-12 DIAGNOSIS — E860 Dehydration: Secondary | ICD-10-CM | POA: Diagnosis present

## 2017-09-12 DIAGNOSIS — J9601 Acute respiratory failure with hypoxia: Secondary | ICD-10-CM | POA: Diagnosis not present

## 2017-09-12 DIAGNOSIS — D649 Anemia, unspecified: Secondary | ICD-10-CM | POA: Diagnosis not present

## 2017-09-12 DIAGNOSIS — J989 Respiratory disorder, unspecified: Secondary | ICD-10-CM | POA: Diagnosis not present

## 2017-09-12 DIAGNOSIS — A4150 Gram-negative sepsis, unspecified: Secondary | ICD-10-CM | POA: Diagnosis not present

## 2017-09-12 DIAGNOSIS — J189 Pneumonia, unspecified organism: Secondary | ICD-10-CM | POA: Diagnosis not present

## 2017-09-12 DIAGNOSIS — I2510 Atherosclerotic heart disease of native coronary artery without angina pectoris: Secondary | ICD-10-CM | POA: Diagnosis not present

## 2017-09-12 DIAGNOSIS — I959 Hypotension, unspecified: Secondary | ICD-10-CM | POA: Diagnosis not present

## 2017-09-12 DIAGNOSIS — Z8679 Personal history of other diseases of the circulatory system: Secondary | ICD-10-CM | POA: Diagnosis not present

## 2017-09-12 DIAGNOSIS — R9431 Abnormal electrocardiogram [ECG] [EKG]: Secondary | ICD-10-CM | POA: Diagnosis not present

## 2017-09-12 DIAGNOSIS — N179 Acute kidney failure, unspecified: Secondary | ICD-10-CM | POA: Diagnosis not present

## 2017-09-12 DIAGNOSIS — G9341 Metabolic encephalopathy: Secondary | ICD-10-CM | POA: Diagnosis not present

## 2017-09-12 DIAGNOSIS — I482 Chronic atrial fibrillation: Secondary | ICD-10-CM | POA: Diagnosis not present

## 2017-09-12 DIAGNOSIS — J9600 Acute respiratory failure, unspecified whether with hypoxia or hypercapnia: Secondary | ICD-10-CM | POA: Diagnosis not present

## 2017-09-12 DIAGNOSIS — I5032 Chronic diastolic (congestive) heart failure: Secondary | ICD-10-CM | POA: Diagnosis not present

## 2017-09-12 DIAGNOSIS — Z9911 Dependence on respirator [ventilator] status: Secondary | ICD-10-CM | POA: Diagnosis not present

## 2017-09-13 ENCOUNTER — Other Ambulatory Visit (HOSPITAL_COMMUNITY): Payer: Medicare Other | Admitting: Internal Medicine

## 2017-09-13 DIAGNOSIS — Z930 Tracheostomy status: Secondary | ICD-10-CM

## 2017-09-13 DIAGNOSIS — N390 Urinary tract infection, site not specified: Secondary | ICD-10-CM

## 2017-09-13 DIAGNOSIS — I482 Chronic atrial fibrillation, unspecified: Secondary | ICD-10-CM

## 2017-09-13 DIAGNOSIS — J9621 Acute and chronic respiratory failure with hypoxia: Secondary | ICD-10-CM

## 2017-09-13 DIAGNOSIS — A419 Sepsis, unspecified organism: Secondary | ICD-10-CM

## 2017-09-13 DIAGNOSIS — R569 Unspecified convulsions: Secondary | ICD-10-CM

## 2017-09-13 NOTE — Progress Notes (Signed)
[place]  Date of Service: 09/13/2017  PULMONARY CONSULT   Melissa Cochran  NFA:[id number]  DOB: 1935-05-23     Referring Physician: Deanne Coffer, MD  HPI: Melissa Cochran is a 82 y.o. female seen for Acute on Chronic Respiratory Failure.  Patient is transferred from [facility] to our facility for further management and weaning.  Patient presented with acute respiratory failure and hypoxia was found at the time to be septic.  Today the sepsis source was felt to be urinary tract infection in addition patient had status epilepticus.  Patient had actually been admitted to [facility] on April 17 with status epilepticus when she presented with a witnessed seizure.  Patient had been given Versed at the time without any improvement.  At that time patient was intubated and placed on mechanical ventilation for airway protection.  She required aggressive seizure medications and had to be placed on propofol as well as Versed.  Ultimately the seizures resolved.  Because of her issues with the airway she underwent a tracheostomy on the end of April 30 because she was not able to be weaned off of the ventilator.  She was started on pressure support weans transferred to our facility for further weaning.  Currently she is on pressure support and has been requiring 30% oxygen.  Review of Systems:  Patient is not able to provide a review of systems  Past Medical History:  Diagnosis Date  . Adnexal mass   . Anxiety   . Arthritis   . Bladder tumor   . Bleeding ulcer   . CHF (congestive heart failure) ([place])   . CHF (congestive heart failure) ([place])   . Chronic bronchitis ([place])   . COPD (chronic obstructive pulmonary disease) ([place])   . Coronary artery disease   . DVT (deep venous thrombosis) ([facility])    RLE  . GERD (gastroesophageal reflux disease)   . Heart block    left  . History of bleeding ulcers   . History of kidney stones    . Hyperlipemia   . Hypertension   . Myocardial infarction ([place])    "slight one" (07/05/2017)  . Pneumonia    "several times" (07/05/2017)  . Seizures ([place]) 02/2017 "several"; 06/26/2017 X 1  . Shortness of breath dyspnea   . Stroke [facility]) 02/13/2018   "still right sided weakness" (07/05/2017)    Past Surgical History:  Procedure Laterality Date  . ABDOMINAL HYSTERECTOMY    . CARDIAC CATHETERIZATION Left 10/21/2015   Procedure: Left Heart Cath and Coronary Angiography;  Surgeon: Isaias Cowman, MD;  Location: [place] [facility];  Service: Cardiovascular;  Laterality: Left;  . CARDIAC CATHETERIZATION N/A 10/21/2015   Procedure: Coronary Stent Intervention;  Surgeon: Isaias Cowman, MD;  Location: [place] [facility];  Service: Cardiovascular;  Laterality: N/A;  . CARDIAC CATHETERIZATION    . CYSTOSCOPY W/ RETROGRADES Bilateral 01/20/2016   Procedure: CYSTOSCOPY WITH RETROGRADE PYELOGRAM;  Surgeon: Hollice Espy, MD;  Location: [facility] ORS;  Service: Urology;  Laterality: Bilateral;  . CYSTOSCOPY WITH STENT PLACEMENT Right 01/20/2016   Procedure: CYSTOSCOPY WITH STENT PLACEMENT;  Surgeon: Hollice Espy, MD;  Location: [facility] ORS;  Service: Urology;  Laterality: Right;  . HIP FRACTURE SURGERY Left   . STOMACH SURGERY     "stomach ulcers"  . TRACHEOSTOMY TUBE PLACEMENT N/A 03/02/2017   Procedure: TRACHEOSTOMY;  Surgeon: Rozetta Nunnery, MD;  Location: [place];  Service: ENT;  Laterality: N/A;  . TRANSURETHRAL RESECTION  OF BLADDER TUMOR N/A 01/20/2016   Procedure: TRANSURETHRAL RESECTION OF BLADDER TUMOR (TURBT);  Surgeon: Hollice Espy, MD;  Location: [facility] ORS;  Service: Urology;  Laterality: N/A;    Social History:    reports that she quit smoking about 19 years ago. She has a 40.00 pack-year smoking history. She has never used smokeless tobacco. She reports that she does not drink alcohol or use drugs.  Family History: Non-Contributory to the present illness  Allergies   Reviewed on the [facility]  Medications: Reviewed on Rounds  Physical Exam:  Vitals: Temperature 97.6 pulse 78 respiratory rate 19 blood pressure 134/60 saturations 99%  Ventilator Settings mode of ventilation pressure support FiO2 30% tidal volume 372 pressure support 14 PEEP 5  . General: Comfortable at this time . Eyes: Grossly normal lids, irises & conjunctiva . ENT: grossly tongue is normal . Neck: no obvious mass . Cardiovascular: S1-S2 normal no gallop or rub . Respiratory: Rhonchi expansion is equal . Abdomen: Soft and nontender . Skin: no rash seen on limited exam . Musculoskeletal: not rigid . Psychiatric:unable to assess . Neurologic: no seizure no involuntary movements         Labs on Admission:  Pertinent data includes a white count of 12.9 hemoglobin 10 hematocrit 31.7 platelet count 351 Sodium 137 potassium 4.5 BUN 42 creatinine 0.3  Radiological Exams on Admission: Reviewed  Assessment/Plan Patient Active Problem List   Diagnosis Date Noted  . E-coli UTI, ESBL  06/30/2017  . A-fib ([place]) 06/30/2017  . Need for protective airway ventilation 06/30/2017  . Seizures ([place]) 06/26/2017  . Acute renal failure ([place])   . Elevated troponin I level   . Encephalitis   . Acute renal failure with tubular necrosis ([place])   . Palliative care encounter   . Pressure injury of skin 02/17/2017  . Convulsive status epilepticus ([place]) 02/13/2017  . Bilateral lower extremity edema 10/08/2016  . Atherosclerosis of both lower extremities with bilateral ulceration of midfeet ([place]) 08/24/2016  . Venous ulcer of ankle ([place]) 07/08/2016  . HTN (hypertension), malignant 07/08/2016  . DVT (deep venous thrombosis) ([place]) 07/02/2016  . Swelling of limb 06/08/2016  . Pain in limb 06/08/2016  . Altered mental status 01/22/2016  . S/P coronary artery stent placement 10/30/2015  . Atherosclerotic heart disease of native coronary artery without angina pectoris 10/21/2015  . Preop cardiovascular  exam 10/08/2015  . SOB (shortness of breath) on exertion 10/08/2015  . Malignant neoplasm of trigone of bladder ([place]) 09/29/2015  . Mass of lower lobe of left lung, incidetnal on CT 09/2015, smoker.  09/29/2015  . Other disorders of lung 09/29/2015  . Other microscopic hematuria 08/26/2015  . Kidney stone 08/26/2015  . Urge incontinence 08/26/2015  . Pneumonia 01/24/2015  . PNA (pneumonia) 01/24/2015  . Venous insufficiency of both lower extremities 12/09/2014  . Neuropathy 12/09/2014  . Essential (primary) hypertension 12/09/2014  . Polyneuropathy 12/09/2014  . Chronic venous insufficiency 12/09/2014  . Adnexal mass 11/28/2013  . History of cardiac catheterization 11/28/2013  . BP (high blood pressure) 11/28/2013  . Hyperlipidemia 11/28/2013  . Other specified postprocedural states 11/28/2013     1. Acute on chronic respiratory failure with hypoxia at this time patient is on weaning protocol patient was started on pressure support will assess and see if she is able to do anything as far as going straight T collar trials.  Titrate oxygen as tolerated continue with aggressive pulmonary toilet 2. Sepsis secondary to urinary tract infection patient was treated with  antibiotics seems to be doing better now clinically will continue to monitor hemodynamics we will continue with supportive care and follow 3. Status epilepticus now without seizures need to monitor her medications closely we will continue with supportive care 4. Tracheostomy continue to work towards weaning and eventual decannulation. 5. Chronic atrial fibrillation currently rate is controlled we will continue to follow  I have personally seen and evaluated the patient, evaluated laboratory and imaging results, formulated the assessment and plan and placed orders. The Patient requires high complexity decision making for assessment and support.  Case was discussed on Rounds with the Respiratory Therapy Staff Time Spent  94minutes  Allyne Gee, MD [facility]

## 2017-09-14 ENCOUNTER — Other Ambulatory Visit (HOSPITAL_COMMUNITY): Payer: Medicare Other | Admitting: Internal Medicine

## 2017-09-14 DIAGNOSIS — A419 Sepsis, unspecified organism: Secondary | ICD-10-CM

## 2017-09-14 DIAGNOSIS — I482 Chronic atrial fibrillation, unspecified: Secondary | ICD-10-CM

## 2017-09-14 DIAGNOSIS — J9621 Acute and chronic respiratory failure with hypoxia: Secondary | ICD-10-CM

## 2017-09-14 DIAGNOSIS — Z930 Tracheostomy status: Secondary | ICD-10-CM

## 2017-09-14 DIAGNOSIS — N390 Urinary tract infection, site not specified: Secondary | ICD-10-CM

## 2017-09-14 DIAGNOSIS — N170 Acute kidney failure with tubular necrosis: Secondary | ICD-10-CM

## 2017-09-14 NOTE — Progress Notes (Signed)
[facility]  PROGRESS NOTE  PULMONARY SERVICE ROUNDS  Date of Service: 09/14/2017  Melissa Cochran  DOB: 03-05-1936  Referring physician: Deanne Coffer, MD  HPI: Melissa Cochran is a 82 y.o. female  being seen for Acute on Chronic Respiratory Failure.  Patient is weaning on T collar currently is on 35% oxygen had a blood gas done which actually looked good the patient's been on T collar for more than 24 hours  Review of Systems: Unremarkable other than noted in HPI  Allergies:  Reviewed on the [facility]  Medications: Reviewed  Vitals: Temperature 98.1 pulse 80 respiratory rate 22 blood pressure 118/68 saturations 97%  Ventilator Settings: Aerosolized T collar FiO2 35%  Physical Exam: . General:  calm and comfortable NAD . Eyes: normal lids, irises & conjunctiva . ENT: grossly normal tongue not enlarged . Neck: no masses . Cardiovascular: S1 S2 Normal no rubs no gallop . Respiratory: No rhonchi expansion is equal . Abdomen: Soft and nontender . Skin: no rash seen on limited exam . Musculoskeletal:  no rigidity . Psychiatric: unable to assess . Neurologic: no involuntary movements          Lab Data:  Reviewed on epic pH 7.48 PCO2 45 PO2 78 saturations 96%  Radiological Data: Reviewed on epic  Assessment/Plan  Patient Active Problem List   Diagnosis Date Noted  . E-coli UTI, ESBL  06/30/2017  . A-fib ([place]) 06/30/2017  . Need for protective airway ventilation 06/30/2017  . Seizures ([place]) 06/26/2017  . Acute renal failure ([place])   . Elevated troponin I level   . Encephalitis   . Acute renal failure with tubular necrosis ([place])   . Palliative care encounter   . Pressure injury of skin 02/17/2017  . Convulsive status epilepticus ([place]) 02/13/2017  . Bilateral lower extremity edema 10/08/2016  . Atherosclerosis of both lower extremities with bilateral ulceration of midfeet ([place]) 08/24/2016  . Venous ulcer of ankle ([place]) 07/08/2016  . HTN  (hypertension), malignant 07/08/2016  . DVT (deep venous thrombosis) ([place]) 07/02/2016  . Swelling of limb 06/08/2016  . Pain in limb 06/08/2016  . Altered mental status 01/22/2016  . S/P coronary artery stent placement 10/30/2015  . Atherosclerotic heart disease of native coronary artery without angina pectoris 10/21/2015  . Preop cardiovascular exam 10/08/2015  . SOB (shortness of breath) on exertion 10/08/2015  . Malignant neoplasm of trigone of bladder ([place]) 09/29/2015  . Mass of lower lobe of left lung, incidetnal on CT 09/2015, smoker.  09/29/2015  . Other disorders of lung 09/29/2015  . Other microscopic hematuria 08/26/2015  . Kidney stone 08/26/2015  . Urge incontinence 08/26/2015  . Pneumonia 01/24/2015  . PNA (pneumonia) 01/24/2015  . Venous insufficiency of both lower extremities 12/09/2014  . Neuropathy 12/09/2014  . Essential (primary) hypertension 12/09/2014  . Polyneuropathy 12/09/2014  . Chronic venous insufficiency 12/09/2014  . Adnexal mass 11/28/2013  . History of cardiac catheterization 11/28/2013  . BP (high blood pressure) 11/28/2013  . Hyperlipidemia 11/28/2013  . Other specified postprocedural states 11/28/2013      1. Acute on chronic respiratory failure with hypoxia patient is doing fairly well with weaning we are going to continue to advance the wean on T collar beyond 24 hours hopefully we should be able to proceed towards capping. 2. Sepsis secondary to urinary tract infection treated clinically is improved patient remains afebrile 3. Status epilepticus no active disease seizures are noted at this time.  We will continue to monitor 4. Chronic  atrial fibrillation rate is controlled we will continue to follow 5. Tracheostomy at baseline we will continue with supportive care   I have personally evaluated the patient, evaluated the laboratory and imaging results and formulated the assessment and plan and placed orders as needed. The Patient requires high  complexity decision making for assessment and support. I have discussed the patient on rounds with the Respiratory Staff   Allyne Gee, MD [facility] Pulmonary Critical Care Medicine

## 2017-09-15 ENCOUNTER — Other Ambulatory Visit (HOSPITAL_COMMUNITY): Payer: Medicare Other | Admitting: Internal Medicine

## 2017-09-15 DIAGNOSIS — Z930 Tracheostomy status: Secondary | ICD-10-CM

## 2017-09-15 DIAGNOSIS — N390 Urinary tract infection, site not specified: Secondary | ICD-10-CM

## 2017-09-15 DIAGNOSIS — I482 Chronic atrial fibrillation, unspecified: Secondary | ICD-10-CM

## 2017-09-15 DIAGNOSIS — A4150 Gram-negative sepsis, unspecified: Secondary | ICD-10-CM

## 2017-09-15 DIAGNOSIS — G40901 Epilepsy, unspecified, not intractable, with status epilepticus: Secondary | ICD-10-CM

## 2017-09-15 DIAGNOSIS — J9621 Acute and chronic respiratory failure with hypoxia: Secondary | ICD-10-CM

## 2017-09-15 NOTE — Progress Notes (Signed)
[facility]  PROGRESS NOTE  PULMONARY SERVICE ROUNDS  Date of Service: 09/15/2017  Melissa Cochran  DOB: 1935/10/25  Referring physician: Deanne Coffer, MD  HPI: Melissa Cochran is a 82 y.o. female  being seen for Acute on Chronic Respiratory Failure.  Currently on T collar patient is on 20% oxygen and well with weaning  Review of Systems: Unremarkable other than noted in HPI  Allergies:  Reviewed on the [facility]  Medications: Reviewed  Vitals: Temperature 98.3 pulse 81 respiratory 20 BP 122/70 saturation 96%  Ventilator Settings: Currently off of the ventilator  Physical Exam: . General:  calm and comfortable NAD . Eyes: normal lids, irises & conjunctiva . ENT: grossly normal tongue not enlarged . Neck: no masses . Cardiovascular: S1 S2 Normal no rubs no gallop . Respiratory: No rhonchi expansion is equal . Abdomen: Soft and nontender . Skin: no rash seen on limited exam . Musculoskeletal:  no rigidity . Psychiatric: unable to assess . Neurologic: no involuntary movements          Lab Data:  No labs to review  Radiological Data: No x-rays to review  Assessment/Plan  Patient Active Problem List   Diagnosis Date Noted  . E-coli UTI, ESBL  06/30/2017  . A-fib ([place]) 06/30/2017  . Need for protective airway ventilation 06/30/2017  . Seizures ([place]) 06/26/2017  . Acute renal failure ([place])   . Elevated troponin I level   . Encephalitis   . Acute renal failure with tubular necrosis ([place])   . Palliative care encounter   . Pressure injury of skin 02/17/2017  . Convulsive status epilepticus ([place]) 02/13/2017  . Bilateral lower extremity edema 10/08/2016  . Atherosclerosis of both lower extremities with bilateral ulceration of midfeet ([place]) 08/24/2016  . Venous ulcer of ankle ([place]) 07/08/2016  . HTN (hypertension), malignant 07/08/2016  . DVT (deep venous thrombosis) ([place]) 07/02/2016  . Swelling of limb 06/08/2016  . Pain in limb 06/08/2016  .  Altered mental status 01/22/2016  . S/P coronary artery stent placement 10/30/2015  . Atherosclerotic heart disease of native coronary artery without angina pectoris 10/21/2015  . Preop cardiovascular exam 10/08/2015  . SOB (shortness of breath) on exertion 10/08/2015  . Malignant neoplasm of trigone of bladder ([place]) 09/29/2015  . Mass of lower lobe of left lung, incidetnal on CT 09/2015, smoker.  09/29/2015  . Other disorders of lung 09/29/2015  . Other microscopic hematuria 08/26/2015  . Kidney stone 08/26/2015  . Urge incontinence 08/26/2015  . Pneumonia 01/24/2015  . PNA (pneumonia) 01/24/2015  . Venous insufficiency of both lower extremities 12/09/2014  . Neuropathy 12/09/2014  . Essential (primary) hypertension 12/09/2014  . Polyneuropathy 12/09/2014  . Chronic venous insufficiency 12/09/2014  . Adnexal mass 11/28/2013  . History of cardiac catheterization 11/28/2013  . BP (high blood pressure) 11/28/2013  . Hyperlipidemia 11/28/2013  . Other specified postprocedural states 11/28/2013      1. Acute on chronic respiratory failure with hypoxia continue with weaning on T collar will attempt PMV and capping trials soon.  We will continue secretion management pulmonary toilet 2. Sepsis secondary to urinary tract infection clinically resolved we will continue to follow along 3. Status epilepticus stable no active seizures 4. Chronic atrial fibrillation rate is controlled 5. Tracheostomy at baseline   I have personally evaluated the patient, evaluated the laboratory and imaging results and formulated the assessment and plan and placed orders as needed. The Patient requires high complexity decision making for assessment and support. I  have discussed the patient on rounds with the Respiratory Staff   Allyne Gee, MD [facility] Pulmonary Critical Care Medicine

## 2017-09-16 ENCOUNTER — Other Ambulatory Visit (HOSPITAL_COMMUNITY): Payer: Medicare Other | Admitting: Internal Medicine

## 2017-09-16 DIAGNOSIS — I482 Chronic atrial fibrillation, unspecified: Secondary | ICD-10-CM

## 2017-09-16 DIAGNOSIS — G40909 Epilepsy, unspecified, not intractable, without status epilepticus: Secondary | ICD-10-CM

## 2017-09-16 DIAGNOSIS — J9621 Acute and chronic respiratory failure with hypoxia: Secondary | ICD-10-CM

## 2017-09-16 DIAGNOSIS — N390 Urinary tract infection, site not specified: Secondary | ICD-10-CM

## 2017-09-16 DIAGNOSIS — Z930 Tracheostomy status: Secondary | ICD-10-CM

## 2017-09-16 DIAGNOSIS — A419 Sepsis, unspecified organism: Secondary | ICD-10-CM

## 2017-09-16 NOTE — Progress Notes (Signed)
Melissa Cochran  PROGRESS NOTE  PULMONARY SERVICE ROUNDS  Date of Service: 09/16/2017  OMESHA Cochran  DOB: Dec 12, 1935  Referring physician: Deanne Coffer, MD  HPI: Melissa Cochran is a 82 y.o. female  being seen for Acute on Chronic Respiratory Failure.  She remains on T, is doing fairly well.  Has been on 28% oxygen so far looking good.  We should hopefully be able to change her tracheostomy tube to a cuffless trach  Review of Systems: Unremarkable other than noted in HPI  Allergies:  Reviewed on the [facility]  Medications: Reviewed  Vitals: Temperature 98.3 pulse 88 respiratory rate 20 blood pressure 130/72 saturations 98%  Ventilator Settings: Off the ventilator on T collar  Physical Exam: . General:  calm and comfortable NAD . Eyes: normal lids, irises & conjunctiva . ENT: grossly normal tongue not enlarged . Neck: no masses . Cardiovascular: S1 S2 Normal no rubs no gallop . Respiratory: No rhonchi expansion is equal . Abdomen: Soft nontender . Skin: no rash seen on limited exam . Musculoskeletal:  no rigidity . Psychiatric: unable to assess . Neurologic: no involuntary movements          Lab Data:  Reviewed  Radiological Data: Reviewed  Assessment/Plan  Patient Active Problem List   Diagnosis Date Noted  . E-coli UTI, ESBL  06/30/2017  . A-fib ([place]) 06/30/2017  . Need for protective airway ventilation 06/30/2017  . Seizures ([place]) 06/26/2017  . Acute renal failure ([place])   . Elevated troponin I level   . Acute renal failure with tubular necrosis ([place])   . Palliative care encounter   . Pressure injury of skin 02/17/2017  . Bilateral lower extremity edema 10/08/2016  . Venous ulcer of ankle ([place]) 07/08/2016  . DVT (deep venous thrombosis) ([place]) 07/02/2016  . Swelling of limb 06/08/2016  . Pain in limb 06/08/2016  . Altered mental status 01/22/2016  . S/P coronary artery stent placement 10/30/2015  . Preop cardiovascular exam  10/08/2015  . SOB (shortness of breath) on exertion 10/08/2015  . Malignant neoplasm of trigone of bladder ([place]) 09/29/2015  . Mass of lower lobe of left lung, incidetnal on CT 09/2015, smoker.  09/29/2015  . Other disorders of lung 09/29/2015  . Other microscopic hematuria 08/26/2015  . Kidney stone 08/26/2015  . Urge incontinence 08/26/2015  . Pneumonia 01/24/2015  . PNA (pneumonia) 01/24/2015  . Essential (primary) hypertension 12/09/2014  . Adnexal mass 11/28/2013  . History of cardiac catheterization 11/28/2013  . Hyperlipidemia 11/28/2013  . Other specified postprocedural states 11/28/2013      1. Acute on chronic respiratory failure with hypoxia continue with supportive care wound try to change the tracheostomy over to a cuffless trach 2. Sepsis syndrome secondary to urinary tract infection treated with antibiotics we will continue with supportive care. 3. Epilepsy not intractable no active seizures noted at this time we will continue to follow 4. Chronic atrial fibrillation rate is well controlled 5. Tracheostomy at baseline we will change the tube   I have personally evaluated the patient, evaluated the laboratory and imaging results and formulated the assessment and plan and placed orders as needed. The Patient requires high complexity decision making for assessment and support. I have discussed the patient on rounds with the Respiratory Staff   Allyne Gee, MD [facility] Pulmonary Critical Care Medicine

## 2017-09-18 ENCOUNTER — Other Ambulatory Visit (HOSPITAL_COMMUNITY): Payer: Medicare Other | Admitting: Internal Medicine

## 2017-09-18 DIAGNOSIS — A419 Sepsis, unspecified organism: Secondary | ICD-10-CM

## 2017-09-18 DIAGNOSIS — N170 Acute kidney failure with tubular necrosis: Secondary | ICD-10-CM

## 2017-09-18 DIAGNOSIS — G40909 Epilepsy, unspecified, not intractable, without status epilepticus: Secondary | ICD-10-CM

## 2017-09-18 DIAGNOSIS — R569 Unspecified convulsions: Secondary | ICD-10-CM

## 2017-09-18 DIAGNOSIS — N390 Urinary tract infection, site not specified: Secondary | ICD-10-CM

## 2017-09-18 DIAGNOSIS — I482 Chronic atrial fibrillation, unspecified: Secondary | ICD-10-CM

## 2017-09-18 NOTE — Progress Notes (Signed)
[facility]  PROGRESS NOTE  PULMONARY SERVICE ROUNDS  Date of Service: 09/18/2017  Melissa Cochran  DOB: 1936-02-22  Referring physician: Deanne Coffer, MD  HPI: Melissa Cochran is a 82 y.o. female  being seen for Acute on Chronic Respiratory Failure.  She continues to do well on T collar and has been off of the ventilator for more than 72 hours now comfortable without distress at this time.  Review of Systems: Unremarkable other than noted in HPI  Allergies:  Reviewed on the [facility]  Medications: Reviewed  Vitals: Temperature 99.3 pulse 89 respiratory rate 28 blood pressure 154/96 saturations 99%  Ventilator Settings: On T collar 28%  Physical Exam: . General:  calm and comfortable NAD . Eyes: normal lids, irises & conjunctiva . ENT: grossly normal tongue not enlarged . Neck: no masses . Cardiovascular: S1 S2 Normal no rubs no gallop . Respiratory: No rhonchi noted at this time. . Abdomen: Soft and nontender . Skin: no rash seen on limited exam . Musculoskeletal:  no rigidity . Psychiatric: unable to assess . Neurologic: no involuntary movements          Lab Data:  Sodium 144 potassium 4.3 BUN 43 creatinine 0.4 White count 12.8 hemoglobin 10.1 hematocrit 33.7 platelet count 386  Radiological Data: No x-rays to report  Assessment/Plan  Patient Active Problem List   Diagnosis Date Noted  . E-coli UTI, ESBL  06/30/2017  . A-fib ([place]) 06/30/2017  . Need for protective airway ventilation 06/30/2017  . Seizures ([place]) 06/26/2017  . Acute renal failure ([place])   . Elevated troponin I level   . Acute renal failure with tubular necrosis ([place])   . Palliative care encounter   . Pressure injury of skin 02/17/2017  . Bilateral lower extremity edema 10/08/2016  . Venous ulcer of ankle ([place]) 07/08/2016  . DVT (deep venous thrombosis) ([place]) 07/02/2016  . Swelling of limb 06/08/2016  . Pain in limb 06/08/2016  . Altered mental status 01/22/2016  .  S/P coronary artery stent placement 10/30/2015  . Preop cardiovascular exam 10/08/2015  . SOB (shortness of breath) on exertion 10/08/2015  . Malignant neoplasm of trigone of bladder ([place]) 09/29/2015  . Mass of lower lobe of left lung, incidetnal on CT 09/2015, smoker.  09/29/2015  . Other disorders of lung 09/29/2015  . Other microscopic hematuria 08/26/2015  . Kidney stone 08/26/2015  . Urge incontinence 08/26/2015  . Pneumonia 01/24/2015  . PNA (pneumonia) 01/24/2015  . Essential (primary) hypertension 12/09/2014  . Adnexal mass 11/28/2013  . History of cardiac catheterization 11/28/2013  . Hyperlipidemia 11/28/2013  . Other specified postprocedural states 11/28/2013     Acute on chronic respiratory failure with hypoxia 1. Acute on chronic respiratory failure with hypoxia at this time patient is on T collar has been tolerating the weaning very well so far patient will be continued on T collar trials titrate oxygen continue with pulmonary toilet secretion management. 2. Sepsis syndrome secondary to urinary tract infection treated with antibiotics we will continue with supportive care 3. Epilepsy no active seizures are noted at this time we will continue to monitor 4. Chronic atrial fibrillation rate is controlled continue to follow 5. Tracheostomy working towards hopefully decannulation.   I have personally evaluated the patient, evaluated the laboratory and imaging results and formulated the assessment and plan and placed orders as needed. The Patient requires high complexity decision making for assessment and support. I have discussed the patient on rounds with the Respiratory Staff  Allyne Gee, MD [facility] Pulmonary Critical Care Medicine

## 2017-09-20 ENCOUNTER — Other Ambulatory Visit (HOSPITAL_COMMUNITY): Payer: Medicare Other | Admitting: Internal Medicine

## 2017-09-20 DIAGNOSIS — I482 Chronic atrial fibrillation, unspecified: Secondary | ICD-10-CM

## 2017-09-20 DIAGNOSIS — J9621 Acute and chronic respiratory failure with hypoxia: Secondary | ICD-10-CM

## 2017-09-20 DIAGNOSIS — G40909 Epilepsy, unspecified, not intractable, without status epilepticus: Secondary | ICD-10-CM

## 2017-09-20 DIAGNOSIS — Z930 Tracheostomy status: Secondary | ICD-10-CM

## 2017-09-20 DIAGNOSIS — A419 Sepsis, unspecified organism: Secondary | ICD-10-CM

## 2017-09-20 DIAGNOSIS — N390 Urinary tract infection, site not specified: Secondary | ICD-10-CM

## 2017-09-20 NOTE — Progress Notes (Signed)
[facility]  PROGRESS NOTE  PULMONARY SERVICE ROUNDS  Date of Service: 09/20/2017  Melissa Cochran  DOB: 10-30-1935  Referring physician: Deanne Coffer, MD  HPI: Melissa Cochran is a 82 y.o. female  being seen for Acute on Chronic Respiratory Failure.  Patient is on T collar at this time has been on room air.  Review of Systems: Unremarkable other than noted in HPI  Allergies:  Reviewed on the [facility]  Medications: Reviewed  Vitals: Temperature 98.5 pulse 100 respiratory rate 18 blood pressure 124/80 saturations 94%  Ventilator Settings: Off the ventilator on T collar  Physical Exam: . General:  calm and comfortable NAD . Eyes: normal lids, irises & conjunctiva . ENT: grossly normal tongue not enlarged . Neck: no masses . Cardiovascular: S1 S2 Normal no rubs no gallop . Respiratory: No rhonchi noted at this time . Abdomen: Soft nontender . Skin: no rash seen on limited exam . Musculoskeletal:  no rigidity . Psychiatric: unable to assess . Neurologic: no involuntary movements          Lab Data:  Reviewed  Radiological Data: Reviewed  Assessment/Plan  Patient Active Problem List   Diagnosis Date Noted  . E-coli UTI, ESBL  06/30/2017  . A-fib ([place]) 06/30/2017  . Need for protective airway ventilation 06/30/2017  . Seizures ([place]) 06/26/2017  . Acute renal failure ([place])   . Elevated troponin I level   . Acute renal failure with tubular necrosis ([place])   . Palliative care encounter   . Pressure injury of skin 02/17/2017  . Bilateral lower extremity edema 10/08/2016  . Venous ulcer of ankle ([place]) 07/08/2016  . DVT (deep venous thrombosis) ([place]) 07/02/2016  . Swelling of limb 06/08/2016  . Pain in limb 06/08/2016  . Altered mental status 01/22/2016  . S/P coronary artery stent placement 10/30/2015  . Preop cardiovascular exam 10/08/2015  . SOB (shortness of breath) on exertion 10/08/2015  . 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 Mass of lower lobe of left lung, incidetnal on CT 09/2015, smoker.  09/29/2015  . Other disorders of lung 09/29/2015  . Other microscopic hematuria 08/26/2015  . Kidney stone 08/26/2015  . Urge incontinence 08/26/2015  . Pneumonia 01/24/2015  . PNA (pneumonia) 01/24/2015  . Essential (primary) hypertension 12/09/2014  . Adnexal mass 11/28/2013  . History of cardiac catheterization 11/28/2013  . Hyperlipidemia 11/28/2013  . Other specified postprocedural states 11/28/2013      1. Acute on chronic respiratory failure with hypoxia she continues to tolerate T collar fairly well.  We are going to try her on PMV I spoke with speech therapy and apparently PMV has been trying and she is doing well with it so we should be able to hopefully advance to capping. 2. Sepsis secondary to urinary tract infection treated we will continue with supportive care 3. Epilepsy no active seizures are noted 4. Chronic atrial fibrillation rate is controlled 5. Tracheostomy working slowly towards decannulation   I have personally evaluated the patient, evaluated the laboratory and imaging results and formulated the assessment and plan and placed orders as needed. The Patient requires high complexity decision making for assessment and support. I have discussed the patient on rounds with the Respiratory Staff   Allyne Gee, MD [facility] Pulmonary Critical Care Medicine

## 2017-09-21 ENCOUNTER — Other Ambulatory Visit (HOSPITAL_COMMUNITY): Payer: Medicare Other | Admitting: Internal Medicine

## 2017-09-21 DIAGNOSIS — N390 Urinary tract infection, site not specified: Secondary | ICD-10-CM

## 2017-09-21 DIAGNOSIS — G40909 Epilepsy, unspecified, not intractable, without status epilepticus: Secondary | ICD-10-CM

## 2017-09-21 DIAGNOSIS — I482 Chronic atrial fibrillation, unspecified: Secondary | ICD-10-CM

## 2017-09-21 DIAGNOSIS — Z930 Tracheostomy status: Secondary | ICD-10-CM

## 2017-09-21 DIAGNOSIS — J9621 Acute and chronic respiratory failure with hypoxia: Secondary | ICD-10-CM

## 2017-09-21 DIAGNOSIS — A419 Sepsis, unspecified organism: Secondary | ICD-10-CM

## 2017-09-21 NOTE — Progress Notes (Signed)
[facility]  PROGRESS NOTE  PULMONARY SERVICE ROUNDS  Date of Service: 09/21/2017  Melissa Cochran  DOB: [birth year]  Referring physician: Deanne Coffer, MD  HPI: Melissa Cochran is a 82 y.o. female  being seen for Acute on Chronic Respiratory Failure.  She remains on T collar has been tolerating PMV so we should be able to hopefully start capping today  Review of Systems: Unremarkable other than noted in HPI  Allergies:  Reviewed on the [facility]  Medications: Reviewed  Vitals: Temperature 98.5 pulse 97 respiratory 22 blood pressure 157/75 saturations 99%  Ventilator Settings: /T collar on room air Start capping  Physical Exam: . General:  calm and comfortable NAD . Eyes: normal lids, irises & conjunctiva . ENT: grossly normal tongue not enlarged . Neck: no masses . Cardiovascular: S1 S2 Normal no rubs no gallop . Respiratory: No rhonchi expansion . Abdomen: Nontender . Skin: no rash seen on limited exam . Musculoskeletal:  no rigidity . Psychiatric: unable to assess . Neurologic: no involuntary movements          Lab Data:  Reviewed  Radiological Data: Reviewed  Assessment/Plan  Patient Active Problem List   Diagnosis Date Noted  . E-coli UTI, ESBL  06/30/2017  . A-fib ([place]) 06/30/2017  . Need for protective airway ventilation 06/30/2017  . Seizures ([place]) 06/26/2017  . Acute renal failure ([place])   . Elevated troponin I level   . Acute renal failure with tubular necrosis ([place])   . Palliative care encounter   . Pressure injury of skin 02/17/2017  . Bilateral lower extremity edema 10/08/2016  . Venous ulcer of ankle ([place]) 07/08/2016  . DVT (deep venous thrombosis) ([place]) 07/02/2016  . Swelling of limb 06/08/2016  . Pain in limb 06/08/2016  . Altered mental status 01/22/2016  . S/P coronary artery stent placement 10/30/2015  . Preop cardiovascular exam 10/08/2015  . SOB (shortness of breath) on exertion 10/08/2015  . Malignant  neoplasm of trigone of bladder ([place]) 09/29/2015  . Mass of lower lobe of left lung, incidetnal on CT 09/2015, smoker.  09/29/2015  . Other disorders of lung 09/29/2015  . Other microscopic hematuria 08/26/2015  . Kidney stone 08/26/2015  . Urge incontinence 08/26/2015  . Pneumonia 01/24/2015  . PNA (pneumonia) 01/24/2015  . Essential (primary) hypertension 12/09/2014  . Adnexal mass 11/28/2013  . History of cardiac catheterization 11/28/2013  . Hyperlipidemia 11/28/2013  . Other specified postprocedural states 11/28/2013      1. Acute on chronic respiratory failure with hypoxia as mentioned we will continue to advance her wean and hopefully start capping trials soon 2. Sepsis secondary to urinary tract infection clinically resolved we will continue to monitor 3. Epilepsy no seizure activity noted at this time 4. Chronic atrial fibrillation rate is controlled 5. Tracheostomy start capping   I have personally evaluated the patient, evaluated the laboratory and imaging results and formulated the assessment and plan and placed orders as needed. The Patient requires high complexity decision making for assessment and support. I have discussed the patient on rounds with the Respiratory Staff   Allyne Gee, MD [facility] Pulmonary Critical Care Medicine

## 2017-09-23 ENCOUNTER — Other Ambulatory Visit (HOSPITAL_COMMUNITY): Payer: Medicare Other | Admitting: Internal Medicine

## 2017-09-23 DIAGNOSIS — A419 Sepsis, unspecified organism: Secondary | ICD-10-CM

## 2017-09-23 DIAGNOSIS — N390 Urinary tract infection, site not specified: Secondary | ICD-10-CM

## 2017-09-23 DIAGNOSIS — Z930 Tracheostomy status: Secondary | ICD-10-CM

## 2017-09-23 DIAGNOSIS — I482 Chronic atrial fibrillation, unspecified: Secondary | ICD-10-CM

## 2017-09-23 DIAGNOSIS — G40909 Epilepsy, unspecified, not intractable, without status epilepticus: Secondary | ICD-10-CM

## 2017-09-23 DIAGNOSIS — J9621 Acute and chronic respiratory failure with hypoxia: Secondary | ICD-10-CM

## 2017-09-23 NOTE — Progress Notes (Signed)
[facility]  PROGRESS NOTE  PULMONARY SERVICE ROUNDS  Date of Service: 09/23/2017  Melissa Cochran  DOB: [birth year]  Referring physician: Deanne Coffer, MD  HPI: Melissa Cochran is a 82 y.o. female  being seen for Acute on Chronic Respiratory Failure.  She is capping doing very well has been on room air.  No issues with increased secretions reportedly.  Review of Systems: Unremarkable other than noted in HPI  Allergies:  Reviewed on the [facility]  Medications: Reviewed  Vitals: Temperature 97.5 pulse 104 respiratory rate 18 blood pressure 130/70 saturations 94%  Ventilator Settings: Capping room air  Physical Exam: . General:  calm and comfortable NAD . Eyes: normal lids, irises & conjunctiva . ENT: grossly normal tongue not enlarged . Neck: no masses . Cardiovascular: S1 S2 Normal no rubs no gallop . Respiratory: No rhonchi noted at this time . Abdomen: Soft nondistended . Skin: no rash seen on limited exam . Musculoskeletal:  no rigidity . Psychiatric: unable to assess . Neurologic: no involuntary movements          Lab Data:  Sodium 152 potassium 3.3 BUN 45 creatinine 0.6 White count 17.8 hemoglobin 10.9 hematocrit 38.4 plt 253  Radiological Data: No x-rays to report  Assessment/Plan  Patient Active Problem List   Diagnosis Date Noted  . E-coli UTI, ESBL  06/30/2017  . A-fib ([place]) 06/30/2017  . Need for protective airway ventilation 06/30/2017  . Seizures ([place]) 06/26/2017  . Acute renal failure ([place])   . Elevated troponin I level   . Acute renal failure with tubular necrosis ([place])   . Palliative care encounter   . Pressure injury of skin 02/17/2017  . Bilateral lower extremity edema 10/08/2016  . Venous ulcer of ankle ([place]) 07/08/2016  . DVT (deep venous thrombosis) ([place]) 07/02/2016  . Swelling of limb 06/08/2016  . Pain in limb 06/08/2016  . Altered mental status 01/22/2016  . S/P coronary artery stent placement 10/30/2015  .  Preop cardiovascular exam 10/08/2015  . SOB (shortness of breath) on exertion 10/08/2015  . 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 Mass of lower lobe of left lung, incidetnal on CT 09/2015, smoker.  09/29/2015  . Other disorders of lung 09/29/2015  . Other microscopic hematuria 08/26/2015  . Kidney stone 08/26/2015  . Urge incontinence 08/26/2015  . Pneumonia 01/24/2015  . PNA (pneumonia) 01/24/2015  . Essential (primary) hypertension 12/09/2014  . Adnexal mass 11/28/2013  . History of cardiac catheterization 11/28/2013  . Hyperlipidemia 11/28/2013  . Other specified postprocedural states 11/28/2013      1. Acute on chronic respiratory failure with hypoxia patient is doing well with capping trials which will be continued we will continue with pulmonary toilet supportive care secretion management.  Patient should be able to hopefully be decannulated. 2. Sepsis secondary to urinary tract infections resolved 3. Epilepsy no active seizures at this time 4. Chronic atrial fibrillation rate is controlled 5. Tracheostomy as mentioned was already capping at this stage   I have personally evaluated the patient, evaluated the laboratory and imaging results and formulated the assessment and plan and placed orders as needed. The Patient requires high complexity decision making for assessment and support. I have discussed the patient on rounds with the Respiratory Staff   Allyne Gee, MD [facility] Pulmonary Critical Care Medicine

## 2017-09-25 ENCOUNTER — Other Ambulatory Visit (HOSPITAL_COMMUNITY): Payer: Medicare Other | Admitting: Internal Medicine

## 2017-09-25 DIAGNOSIS — Z930 Tracheostomy status: Secondary | ICD-10-CM

## 2017-09-25 DIAGNOSIS — J9621 Acute and chronic respiratory failure with hypoxia: Secondary | ICD-10-CM

## 2017-09-25 DIAGNOSIS — N390 Urinary tract infection, site not specified: Secondary | ICD-10-CM

## 2017-09-25 DIAGNOSIS — I482 Chronic atrial fibrillation, unspecified: Secondary | ICD-10-CM

## 2017-09-25 DIAGNOSIS — G40909 Epilepsy, unspecified, not intractable, without status epilepticus: Secondary | ICD-10-CM

## 2017-09-25 DIAGNOSIS — A419 Sepsis, unspecified organism: Secondary | ICD-10-CM

## 2017-09-25 NOTE — Progress Notes (Signed)
[facility]  PROGRESS NOTE  PULMONARY SERVICE ROUNDS  Date of Service: 09/25/2017  Melissa Cochran  DOB: 07-04-1935  Referring physician: Deanne Coffer, MD  HPI: Melissa Cochran is a 82 y.o. female  being seen for Acute on Chronic Respiratory Failure.  She is Comfortable does not appear to be in any distress at this time.  She is also on room air.  Review of Systems: Unremarkable other than noted in HPI  Allergies:  Reviewed on the [facility]  Medications: Reviewed  Vitals: Temperature 99.0 pulse 100 respiratory rate 20 blood pressure 120/66 saturations 96%.  Ventilator Settings: Capping room air  Physical Exam: . General:  calm and comfortable NAD . Eyes: normal lids, irises & conjunctiva . ENT: grossly normal tongue not enlarged . Neck: no masses . Cardiovascular: S1 S2 Normal no rubs no gallop . Respiratory: No rhonchi expansion is equal . Abdomen: Soft nontender . Skin: no rash seen on limited exam . Musculoskeletal:  no rigidity . Psychiatric: unable to assess . Neurologic: no involuntary movements          Lab Data:  Reviewed  Radiological Data: Reviewed  Assessment/Plan  Patient Active Problem List   Diagnosis Date Noted  . E-coli UTI, ESBL  06/30/2017  . A-fib ([place]) 06/30/2017  . Need for protective airway ventilation 06/30/2017  . Seizures ([place]) 06/26/2017  . Acute renal failure ([place])   . Elevated troponin I level   . Acute renal failure with tubular necrosis ([place])   . Palliative care encounter   . Pressure injury of skin 02/17/2017  . Bilateral lower extremity edema 10/08/2016  . Venous ulcer of ankle ([place]) 07/08/2016  . DVT (deep venous thrombosis) ([place]) 07/02/2016  . Swelling of limb 06/08/2016  . Pain in limb 06/08/2016  . Altered mental status 01/22/2016  . S/P coronary artery stent placement 10/30/2015  . Preop cardiovascular exam 10/08/2015  . SOB (shortness of breath) on exertion 10/08/2015  . Malignant neoplasm of  trigone of bladder ([place]) 09/29/2015  . Mass of lower lobe of left lung, incidetnal on CT 09/2015, smoker.  09/29/2015  . Other disorders of lung 09/29/2015  . Other microscopic hematuria 08/26/2015  . Kidney stone 08/26/2015  . Urge incontinence 08/26/2015  . Pneumonia 01/24/2015  . PNA (pneumonia) 01/24/2015  . Essential (primary) hypertension 12/09/2014  . Adnexal mass 11/28/2013  . History of cardiac catheterization 11/28/2013  . Hyperlipidemia 11/28/2013  . Other specified postprocedural states 11/28/2013      1. Acute on chronic respiratory failure with hypoxia.  Right now is doing fine with weaning on capping trials.  We will continue to cath and will discuss with multidisciplinary team regarding decannulation possibilities. 2. Sepsis secondary to urinary tract infection clinically improved 3. Epilepsy does not appear to have any active seizures 4. Chronic atrial fibrillation rate is controlled 5. Tracheostomy working towards decannulation   I have personally evaluated the patient, evaluated the laboratory and imaging results and formulated the assessment and plan and placed orders as needed. The Patient requires high complexity decision making for assessment and support. I have discussed the patient on rounds with the Respiratory Staff   Allyne Gee, MD [facility] Pulmonary Critical Care Medicine

## 2017-10-07 DIAGNOSIS — J9600 Acute respiratory failure, unspecified whether with hypoxia or hypercapnia: Secondary | ICD-10-CM | POA: Diagnosis not present

## 2017-10-07 DIAGNOSIS — J9620 Acute and chronic respiratory failure, unspecified whether with hypoxia or hypercapnia: Secondary | ICD-10-CM | POA: Diagnosis not present

## 2017-10-07 DIAGNOSIS — R579 Shock, unspecified: Secondary | ICD-10-CM | POA: Diagnosis not present

## 2017-10-07 DIAGNOSIS — I69398 Other sequelae of cerebral infarction: Secondary | ICD-10-CM | POA: Diagnosis not present

## 2017-10-07 DIAGNOSIS — I2510 Atherosclerotic heart disease of native coronary artery without angina pectoris: Secondary | ICD-10-CM | POA: Diagnosis present

## 2017-10-07 DIAGNOSIS — R627 Adult failure to thrive: Secondary | ICD-10-CM | POA: Diagnosis not present

## 2017-10-07 DIAGNOSIS — J9690 Respiratory failure, unspecified, unspecified whether with hypoxia or hypercapnia: Secondary | ICD-10-CM | POA: Diagnosis not present

## 2017-10-07 DIAGNOSIS — I639 Cerebral infarction, unspecified: Secondary | ICD-10-CM | POA: Diagnosis not present

## 2017-10-07 DIAGNOSIS — Z87891 Personal history of nicotine dependence: Secondary | ICD-10-CM | POA: Diagnosis not present

## 2017-10-07 DIAGNOSIS — K219 Gastro-esophageal reflux disease without esophagitis: Secondary | ICD-10-CM | POA: Diagnosis present

## 2017-10-07 DIAGNOSIS — E872 Acidosis: Secondary | ICD-10-CM | POA: Diagnosis present

## 2017-10-07 DIAGNOSIS — I482 Chronic atrial fibrillation: Secondary | ICD-10-CM | POA: Diagnosis not present

## 2017-10-07 DIAGNOSIS — I959 Hypotension, unspecified: Secondary | ICD-10-CM | POA: Diagnosis not present

## 2017-10-07 DIAGNOSIS — I82621 Acute embolism and thrombosis of deep veins of right upper extremity: Secondary | ICD-10-CM | POA: Diagnosis not present

## 2017-10-07 DIAGNOSIS — I509 Heart failure, unspecified: Secondary | ICD-10-CM | POA: Diagnosis not present

## 2017-10-07 DIAGNOSIS — M1990 Unspecified osteoarthritis, unspecified site: Secondary | ICD-10-CM | POA: Diagnosis present

## 2017-10-07 DIAGNOSIS — E785 Hyperlipidemia, unspecified: Secondary | ICD-10-CM | POA: Diagnosis present

## 2017-10-07 DIAGNOSIS — Z7982 Long term (current) use of aspirin: Secondary | ICD-10-CM | POA: Diagnosis not present

## 2017-10-07 DIAGNOSIS — J9602 Acute respiratory failure with hypercapnia: Secondary | ICD-10-CM | POA: Diagnosis not present

## 2017-10-07 DIAGNOSIS — I469 Cardiac arrest, cause unspecified: Secondary | ICD-10-CM | POA: Diagnosis not present

## 2017-10-07 DIAGNOSIS — I480 Paroxysmal atrial fibrillation: Secondary | ICD-10-CM | POA: Diagnosis not present

## 2017-10-07 DIAGNOSIS — J9621 Acute and chronic respiratory failure with hypoxia: Secondary | ICD-10-CM | POA: Diagnosis present

## 2017-10-07 DIAGNOSIS — D649 Anemia, unspecified: Secondary | ICD-10-CM | POA: Diagnosis not present

## 2017-10-07 DIAGNOSIS — I468 Cardiac arrest due to other underlying condition: Secondary | ICD-10-CM | POA: Diagnosis not present

## 2017-10-07 DIAGNOSIS — I82B11 Acute embolism and thrombosis of right subclavian vein: Secondary | ICD-10-CM | POA: Diagnosis not present

## 2017-10-07 DIAGNOSIS — J989 Respiratory disorder, unspecified: Secondary | ICD-10-CM | POA: Diagnosis not present

## 2017-10-07 DIAGNOSIS — Z515 Encounter for palliative care: Secondary | ICD-10-CM | POA: Diagnosis not present

## 2017-10-07 DIAGNOSIS — J9622 Acute and chronic respiratory failure with hypercapnia: Secondary | ICD-10-CM | POA: Diagnosis not present

## 2017-10-07 DIAGNOSIS — G9341 Metabolic encephalopathy: Secondary | ICD-10-CM | POA: Diagnosis present

## 2017-10-07 DIAGNOSIS — Z4682 Encounter for fitting and adjustment of non-vascular catheter: Secondary | ICD-10-CM | POA: Diagnosis not present

## 2017-10-07 DIAGNOSIS — Z8551 Personal history of malignant neoplasm of bladder: Secondary | ICD-10-CM | POA: Diagnosis not present

## 2017-10-07 DIAGNOSIS — Z7189 Other specified counseling: Secondary | ICD-10-CM | POA: Diagnosis not present

## 2017-10-07 DIAGNOSIS — I5032 Chronic diastolic (congestive) heart failure: Secondary | ICD-10-CM | POA: Diagnosis present

## 2017-10-07 DIAGNOSIS — D638 Anemia in other chronic diseases classified elsewhere: Secondary | ICD-10-CM | POA: Diagnosis present

## 2017-10-07 DIAGNOSIS — Z452 Encounter for adjustment and management of vascular access device: Secondary | ICD-10-CM | POA: Diagnosis not present

## 2017-10-07 DIAGNOSIS — I110 Hypertensive heart disease with heart failure: Secondary | ICD-10-CM | POA: Diagnosis present

## 2017-10-07 DIAGNOSIS — Z955 Presence of coronary angioplasty implant and graft: Secondary | ICD-10-CM | POA: Diagnosis not present

## 2017-10-07 DIAGNOSIS — J189 Pneumonia, unspecified organism: Secondary | ICD-10-CM | POA: Diagnosis present

## 2017-10-07 DIAGNOSIS — J690 Pneumonitis due to inhalation of food and vomit: Secondary | ICD-10-CM | POA: Diagnosis present

## 2017-10-07 DIAGNOSIS — J9610 Chronic respiratory failure, unspecified whether with hypoxia or hypercapnia: Secondary | ICD-10-CM | POA: Diagnosis not present

## 2017-10-07 DIAGNOSIS — Z90710 Acquired absence of both cervix and uterus: Secondary | ICD-10-CM | POA: Diagnosis not present

## 2017-10-07 DIAGNOSIS — J9601 Acute respiratory failure with hypoxia: Secondary | ICD-10-CM | POA: Diagnosis not present

## 2017-10-07 DIAGNOSIS — G9340 Encephalopathy, unspecified: Secondary | ICD-10-CM | POA: Diagnosis not present

## 2017-10-07 DIAGNOSIS — J449 Chronic obstructive pulmonary disease, unspecified: Secondary | ICD-10-CM | POA: Diagnosis present

## 2017-10-07 DIAGNOSIS — Z7401 Bed confinement status: Secondary | ICD-10-CM | POA: Diagnosis not present

## 2017-10-07 DIAGNOSIS — D329 Benign neoplasm of meninges, unspecified: Secondary | ICD-10-CM | POA: Diagnosis not present

## 2017-10-07 DIAGNOSIS — I69359 Hemiplegia and hemiparesis following cerebral infarction affecting unspecified side: Secondary | ICD-10-CM | POA: Diagnosis not present

## 2017-10-07 DIAGNOSIS — I872 Venous insufficiency (chronic) (peripheral): Secondary | ICD-10-CM | POA: Diagnosis present

## 2017-10-07 DIAGNOSIS — R570 Cardiogenic shock: Secondary | ICD-10-CM | POA: Diagnosis present

## 2017-10-22 DIAGNOSIS — R0989 Other specified symptoms and signs involving the circulatory and respiratory systems: Secondary | ICD-10-CM | POA: Diagnosis not present

## 2017-10-22 DIAGNOSIS — Z430 Encounter for attention to tracheostomy: Secondary | ICD-10-CM | POA: Diagnosis not present

## 2017-10-22 DIAGNOSIS — I82601 Acute embolism and thrombosis of unspecified veins of right upper extremity: Secondary | ICD-10-CM | POA: Diagnosis not present

## 2017-10-22 DIAGNOSIS — I468 Cardiac arrest due to other underlying condition: Secondary | ICD-10-CM | POA: Diagnosis not present

## 2017-10-22 DIAGNOSIS — Z86718 Personal history of other venous thrombosis and embolism: Secondary | ICD-10-CM | POA: Diagnosis not present

## 2017-10-22 DIAGNOSIS — E870 Hyperosmolality and hypernatremia: Secondary | ICD-10-CM | POA: Diagnosis not present

## 2017-10-22 DIAGNOSIS — D320 Benign neoplasm of cerebral meninges: Secondary | ICD-10-CM | POA: Diagnosis not present

## 2017-10-22 DIAGNOSIS — J181 Lobar pneumonia, unspecified organism: Secondary | ICD-10-CM | POA: Diagnosis not present

## 2017-10-22 DIAGNOSIS — I272 Pulmonary hypertension, unspecified: Secondary | ICD-10-CM | POA: Diagnosis not present

## 2017-10-22 DIAGNOSIS — R29898 Other symptoms and signs involving the musculoskeletal system: Secondary | ICD-10-CM | POA: Diagnosis not present

## 2017-10-22 DIAGNOSIS — L259 Unspecified contact dermatitis, unspecified cause: Secondary | ICD-10-CM | POA: Diagnosis not present

## 2017-10-22 DIAGNOSIS — L89150 Pressure ulcer of sacral region, unstageable: Secondary | ICD-10-CM | POA: Diagnosis present

## 2017-10-22 DIAGNOSIS — I639 Cerebral infarction, unspecified: Secondary | ICD-10-CM | POA: Diagnosis not present

## 2017-10-22 DIAGNOSIS — Z9981 Dependence on supplemental oxygen: Secondary | ICD-10-CM | POA: Diagnosis not present

## 2017-10-22 DIAGNOSIS — D638 Anemia in other chronic diseases classified elsewhere: Secondary | ICD-10-CM | POA: Diagnosis present

## 2017-10-22 DIAGNOSIS — I340 Nonrheumatic mitral (valve) insufficiency: Secondary | ICD-10-CM | POA: Diagnosis not present

## 2017-10-22 DIAGNOSIS — M6250 Muscle wasting and atrophy, not elsewhere classified, unspecified site: Secondary | ICD-10-CM | POA: Diagnosis not present

## 2017-10-22 DIAGNOSIS — N170 Acute kidney failure with tubular necrosis: Secondary | ICD-10-CM | POA: Diagnosis not present

## 2017-10-22 DIAGNOSIS — I4891 Unspecified atrial fibrillation: Secondary | ICD-10-CM | POA: Diagnosis not present

## 2017-10-22 DIAGNOSIS — Z8674 Personal history of sudden cardiac arrest: Secondary | ICD-10-CM | POA: Diagnosis not present

## 2017-10-22 DIAGNOSIS — I69398 Other sequelae of cerebral infarction: Secondary | ICD-10-CM | POA: Diagnosis not present

## 2017-10-22 DIAGNOSIS — K219 Gastro-esophageal reflux disease without esophagitis: Secondary | ICD-10-CM | POA: Diagnosis not present

## 2017-10-22 DIAGNOSIS — R627 Adult failure to thrive: Secondary | ICD-10-CM | POA: Diagnosis not present

## 2017-10-22 DIAGNOSIS — B9620 Unspecified Escherichia coli [E. coli] as the cause of diseases classified elsewhere: Secondary | ICD-10-CM | POA: Diagnosis not present

## 2017-10-22 DIAGNOSIS — L89109 Pressure ulcer of unspecified part of back, unspecified stage: Secondary | ICD-10-CM | POA: Diagnosis not present

## 2017-10-22 DIAGNOSIS — Z930 Tracheostomy status: Secondary | ICD-10-CM | POA: Diagnosis not present

## 2017-10-22 DIAGNOSIS — R509 Fever, unspecified: Secondary | ICD-10-CM | POA: Diagnosis not present

## 2017-10-22 DIAGNOSIS — Z8673 Personal history of transient ischemic attack (TIA), and cerebral infarction without residual deficits: Secondary | ICD-10-CM | POA: Diagnosis not present

## 2017-10-22 DIAGNOSIS — J9601 Acute respiratory failure with hypoxia: Secondary | ICD-10-CM | POA: Diagnosis not present

## 2017-10-22 DIAGNOSIS — I10 Essential (primary) hypertension: Secondary | ICD-10-CM | POA: Diagnosis not present

## 2017-10-22 DIAGNOSIS — I739 Peripheral vascular disease, unspecified: Secondary | ICD-10-CM | POA: Diagnosis not present

## 2017-10-22 DIAGNOSIS — L89154 Pressure ulcer of sacral region, stage 4: Secondary | ICD-10-CM | POA: Diagnosis not present

## 2017-10-22 DIAGNOSIS — J9621 Acute and chronic respiratory failure with hypoxia: Secondary | ICD-10-CM | POA: Diagnosis not present

## 2017-10-22 DIAGNOSIS — M24541 Contracture, right hand: Secondary | ICD-10-CM | POA: Diagnosis not present

## 2017-10-22 DIAGNOSIS — J189 Pneumonia, unspecified organism: Secondary | ICD-10-CM | POA: Diagnosis not present

## 2017-10-22 DIAGNOSIS — J811 Chronic pulmonary edema: Secondary | ICD-10-CM | POA: Diagnosis not present

## 2017-10-22 DIAGNOSIS — I480 Paroxysmal atrial fibrillation: Secondary | ICD-10-CM | POA: Diagnosis not present

## 2017-10-22 DIAGNOSIS — I482 Chronic atrial fibrillation: Secondary | ICD-10-CM | POA: Diagnosis not present

## 2017-10-22 DIAGNOSIS — N186 End stage renal disease: Secondary | ICD-10-CM | POA: Diagnosis not present

## 2017-10-22 DIAGNOSIS — G9340 Encephalopathy, unspecified: Secondary | ICD-10-CM | POA: Diagnosis not present

## 2017-10-22 DIAGNOSIS — J9622 Acute and chronic respiratory failure with hypercapnia: Secondary | ICD-10-CM | POA: Diagnosis not present

## 2017-10-22 DIAGNOSIS — J9602 Acute respiratory failure with hypercapnia: Secondary | ICD-10-CM | POA: Diagnosis not present

## 2017-10-22 DIAGNOSIS — J9811 Atelectasis: Secondary | ICD-10-CM | POA: Diagnosis not present

## 2017-10-22 DIAGNOSIS — L89122 Pressure ulcer of left upper back, stage 2: Secondary | ICD-10-CM | POA: Diagnosis not present

## 2017-10-22 DIAGNOSIS — E785 Hyperlipidemia, unspecified: Secondary | ICD-10-CM | POA: Diagnosis present

## 2017-10-22 DIAGNOSIS — Z431 Encounter for attention to gastrostomy: Secondary | ICD-10-CM | POA: Diagnosis not present

## 2017-10-22 DIAGNOSIS — J690 Pneumonitis due to inhalation of food and vomit: Secondary | ICD-10-CM | POA: Diagnosis not present

## 2017-10-22 DIAGNOSIS — G40501 Epileptic seizures related to external causes, not intractable, with status epilepticus: Secondary | ICD-10-CM | POA: Diagnosis not present

## 2017-10-22 DIAGNOSIS — E119 Type 2 diabetes mellitus without complications: Secondary | ICD-10-CM | POA: Diagnosis not present

## 2017-10-22 DIAGNOSIS — R0602 Shortness of breath: Secondary | ICD-10-CM | POA: Diagnosis not present

## 2017-10-22 DIAGNOSIS — I509 Heart failure, unspecified: Secondary | ICD-10-CM | POA: Diagnosis not present

## 2017-10-22 DIAGNOSIS — Z87891 Personal history of nicotine dependence: Secondary | ICD-10-CM | POA: Diagnosis not present

## 2017-10-22 DIAGNOSIS — J9620 Acute and chronic respiratory failure, unspecified whether with hypoxia or hypercapnia: Secondary | ICD-10-CM | POA: Diagnosis not present

## 2017-10-22 DIAGNOSIS — G40909 Epilepsy, unspecified, not intractable, without status epilepticus: Secondary | ICD-10-CM | POA: Diagnosis present

## 2017-10-22 DIAGNOSIS — G931 Anoxic brain damage, not elsewhere classified: Secondary | ICD-10-CM | POA: Diagnosis not present

## 2017-10-22 DIAGNOSIS — N179 Acute kidney failure, unspecified: Secondary | ICD-10-CM | POA: Diagnosis not present

## 2017-10-22 DIAGNOSIS — I2510 Atherosclerotic heart disease of native coronary artery without angina pectoris: Secondary | ICD-10-CM | POA: Diagnosis not present

## 2017-10-22 DIAGNOSIS — I361 Nonrheumatic tricuspid (valve) insufficiency: Secondary | ICD-10-CM | POA: Diagnosis not present

## 2017-10-22 DIAGNOSIS — R197 Diarrhea, unspecified: Secondary | ICD-10-CM | POA: Diagnosis not present

## 2017-10-22 DIAGNOSIS — Z9911 Dependence on respirator [ventilator] status: Secondary | ICD-10-CM | POA: Diagnosis not present

## 2017-10-22 DIAGNOSIS — F445 Conversion disorder with seizures or convulsions: Secondary | ICD-10-CM | POA: Diagnosis not present

## 2017-10-22 DIAGNOSIS — R1312 Dysphagia, oropharyngeal phase: Secondary | ICD-10-CM | POA: Diagnosis not present

## 2017-10-22 DIAGNOSIS — I5032 Chronic diastolic (congestive) heart failure: Secondary | ICD-10-CM | POA: Diagnosis not present

## 2017-10-22 DIAGNOSIS — M436 Torticollis: Secondary | ICD-10-CM | POA: Diagnosis not present

## 2017-10-22 DIAGNOSIS — M24542 Contracture, left hand: Secondary | ICD-10-CM | POA: Diagnosis not present

## 2017-10-22 DIAGNOSIS — R0609 Other forms of dyspnea: Secondary | ICD-10-CM | POA: Diagnosis not present

## 2017-10-22 DIAGNOSIS — D329 Benign neoplasm of meninges, unspecified: Secondary | ICD-10-CM | POA: Diagnosis not present

## 2017-10-22 DIAGNOSIS — N390 Urinary tract infection, site not specified: Secondary | ICD-10-CM | POA: Diagnosis not present

## 2017-10-22 DIAGNOSIS — J449 Chronic obstructive pulmonary disease, unspecified: Secondary | ICD-10-CM | POA: Diagnosis not present

## 2017-10-22 DIAGNOSIS — M6281 Muscle weakness (generalized): Secondary | ICD-10-CM | POA: Diagnosis not present

## 2017-10-22 DIAGNOSIS — J9600 Acute respiratory failure, unspecified whether with hypoxia or hypercapnia: Secondary | ICD-10-CM | POA: Diagnosis not present

## 2017-10-22 DIAGNOSIS — I517 Cardiomegaly: Secondary | ICD-10-CM | POA: Diagnosis not present

## 2017-10-22 DIAGNOSIS — R000 Tachycardia, unspecified: Secondary | ICD-10-CM | POA: Diagnosis not present

## 2017-10-22 DIAGNOSIS — I82621 Acute embolism and thrombosis of deep veins of right upper extremity: Secondary | ICD-10-CM | POA: Diagnosis not present

## 2017-10-22 DIAGNOSIS — J90 Pleural effusion, not elsewhere classified: Secondary | ICD-10-CM | POA: Diagnosis not present

## 2017-10-22 DIAGNOSIS — G40309 Generalized idiopathic epilepsy and epileptic syndromes, not intractable, without status epilepticus: Secondary | ICD-10-CM | POA: Diagnosis not present

## 2017-10-22 DIAGNOSIS — R9431 Abnormal electrocardiogram [ECG] [EKG]: Secondary | ICD-10-CM | POA: Diagnosis not present

## 2017-10-22 DIAGNOSIS — Z789 Other specified health status: Secondary | ICD-10-CM | POA: Diagnosis not present

## 2017-10-22 DIAGNOSIS — I110 Hypertensive heart disease with heart failure: Secondary | ICD-10-CM | POA: Diagnosis not present

## 2017-10-22 DIAGNOSIS — L89120 Pressure ulcer of left upper back, unstageable: Secondary | ICD-10-CM | POA: Diagnosis not present

## 2017-11-06 ENCOUNTER — Other Ambulatory Visit (HOSPITAL_COMMUNITY): Payer: Medicare Other | Admitting: Internal Medicine

## 2017-11-06 DIAGNOSIS — N170 Acute kidney failure with tubular necrosis: Secondary | ICD-10-CM

## 2017-11-06 DIAGNOSIS — I482 Chronic atrial fibrillation, unspecified: Secondary | ICD-10-CM

## 2017-11-06 DIAGNOSIS — N179 Acute kidney failure, unspecified: Secondary | ICD-10-CM

## 2017-11-06 DIAGNOSIS — J9621 Acute and chronic respiratory failure with hypoxia: Secondary | ICD-10-CM

## 2017-11-06 DIAGNOSIS — J181 Lobar pneumonia, unspecified organism: Secondary | ICD-10-CM

## 2017-11-06 DIAGNOSIS — J189 Pneumonia, unspecified organism: Secondary | ICD-10-CM

## 2017-11-06 DIAGNOSIS — R569 Unspecified convulsions: Secondary | ICD-10-CM

## 2017-11-06 DIAGNOSIS — Z930 Tracheostomy status: Secondary | ICD-10-CM

## 2017-11-06 NOTE — Progress Notes (Signed)
[place] NOTE  PULMONARY SERVICE ROUNDS  Date of Service: 11/06/2017  EVIA GOLDSMITH  DOB: [birth year]  Referring physician: Deanne Coffer, MD  HPI: JAYDALEE BARDWELL is a 82 y.o. female  being seen for Acute on Chronic Respiratory Failure.  She is capping today on 2 L nasal cannula has been capped for more than 48 hours looks comfortable without distress  Review of Systems: Unremarkable other than noted in HPI  Allergies:  Reviewed on the [facility]  Medications: Reviewed  Vitals: Temperature 99.9 pulse 111 respiratory rate 16 blood pressure 138/72 saturations 96%  Ventilator Settings: Off of the ventilator  Physical Exam: . General:  calm and comfortable NAD . Eyes: normal lids, irises & conjunctiva . ENT: grossly normal tongue not enlarged . Neck: no masses . Cardiovascular: S1 S2 Normal no rubs no gallop . Respiratory: No rhonchi or rales . Abdomen: soft non-distended . Skin: no rash seen on limited exam . Musculoskeletal:  no rigidity . Psychiatric: unable to assess . Neurologic: no involuntary movements          Lab Data and radiological Data:  Data reviewed   Assessment/Plan  Patient Active Problem List   Diagnosis Date Noted  . E-coli UTI, ESBL  06/30/2017  . A-fib ([place]) 06/30/2017  . Need for protective airway ventilation 06/30/2017  . Seizures ([place]) 06/26/2017  . Acute renal failure ([place])   . Elevated troponin I level   . Acute renal failure with tubular necrosis ([place])   . Palliative care encounter   . Pressure injury of skin 02/17/2017  . Bilateral lower extremity edema 10/08/2016  . Venous ulcer of ankle ([place]) 07/08/2016  . DVT (deep venous thrombosis) ([place]) 07/02/2016  . Swelling of limb 06/08/2016  . Pain in limb 06/08/2016  . Altered mental status 01/22/2016  . S/P coronary artery stent placement 10/30/2015  . Preop cardiovascular exam 10/08/2015  . SOB (shortness of breath) on exertion 10/08/2015  .  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 Mass of lower lobe of left lung, incidetnal on CT 09/2015, smoker.  09/29/2015  . Other disorders of lung 09/29/2015  . Other microscopic hematuria 08/26/2015  . Kidney stone 08/26/2015  . Urge incontinence 08/26/2015  . Pneumonia 01/24/2015  . PNA (pneumonia) 01/24/2015  . Essential (primary) hypertension 12/09/2014  . Adnexal mass 11/28/2013  . History of cardiac catheterization 11/28/2013  . Hyperlipidemia 11/28/2013  . Other specified postprocedural states 11/28/2013      1. Acute on chronic respiratory failure with hypoxia she is now weaning has been capping and actually doing fairly well.  Hopefully will working towards decannulation.  We will discuss this further with the team on rounds. 2. Epilepsy seizure disorder no active seizures noted at this time. 3. Chronic atrial fibrillation rate is controlled 4. Tracheostomy stable 5. Lobar pneumonia treated with antibiotics we will continue with present management.   I have personally evaluated the patient, evaluated the laboratory and imaging results and formulated the assessment and plan and placed orders as needed. The Patient requires high complexity decision making for assessment and support. I have discussed the patient on rounds with the Respiratory Staff   Allyne Gee, MD [facility] Pulmonary Critical Care Medicine

## 2017-11-07 ENCOUNTER — Other Ambulatory Visit (HOSPITAL_COMMUNITY): Payer: Medicare Other | Admitting: Internal Medicine

## 2017-11-07 DIAGNOSIS — N170 Acute kidney failure with tubular necrosis: Secondary | ICD-10-CM

## 2017-11-07 DIAGNOSIS — J181 Lobar pneumonia, unspecified organism: Secondary | ICD-10-CM

## 2017-11-07 DIAGNOSIS — I482 Chronic atrial fibrillation, unspecified: Secondary | ICD-10-CM

## 2017-11-07 DIAGNOSIS — N179 Acute kidney failure, unspecified: Secondary | ICD-10-CM

## 2017-11-07 DIAGNOSIS — Z930 Tracheostomy status: Secondary | ICD-10-CM

## 2017-11-07 NOTE — Progress Notes (Signed)
[place] NOTE  PULMONARY SERVICE ROUNDS  Date of Service: 11/07/2017  KINGA CASSAR  DOB: 06/17/1935  Referring physician: Deanne Coffer, MD  HPI: DOREEN GARRETSON is a 82 y.o. female  being seen for Acute on Chronic Respiratory Failure.  She is capping doing fairly well.  Has not had any issues with the capping.  Remains on about 2 L nasal cannula  Review of Systems: Unremarkable other than noted in HPI  Allergies:  Reviewed on the [facility]  Medications: Reviewed  Vitals: Temperature 97.0 pulse 98 respiratory rate 20 blood pressure 126/80 saturations 98%  Ventilator Settings: Capping off of the ventilator on 2 L nasal cannula  Physical Exam: . General:  calm and comfortable NAD . Eyes: normal lids, irises & conjunctiva . ENT: grossly normal tongue not enlarged . Neck: no masses . Cardiovascular: S1 S2 Normal no rubs no gallop . Respiratory: Coarse breath sounds no rhonchi are noted at this time. . Abdomen: soft non-distended . Skin: no rash seen on limited exam . Musculoskeletal:  no rigidity . Psychiatric: unable to assess . Neurologic: no involuntary movements          Lab Data and radiological Data:  Data has been reviewed   Assessment/Plan  Patient Active Problem List   Diagnosis Date Noted  . E-coli UTI, ESBL  06/30/2017  . A-fib ([place]) 06/30/2017  . Need for protective airway ventilation 06/30/2017  . Seizures ([place]) 06/26/2017  . Acute renal failure ([place])   . Elevated troponin I level   . Acute renal failure with tubular necrosis ([place])   . Palliative care encounter   . Pressure injury of skin 02/17/2017  . Bilateral lower extremity edema 10/08/2016  . Venous ulcer of ankle ([place]) 07/08/2016  . DVT (deep venous thrombosis) ([place]) 07/02/2016  . Swelling of limb 06/08/2016  . Pain in limb 06/08/2016  . Altered mental status 01/22/2016  . S/P coronary artery stent placement 10/30/2015  . Preop cardiovascular exam  10/08/2015  . SOB (shortness of breath) on exertion 10/08/2015  . 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 Mass of lower lobe of left lung, incidetnal on CT 09/2015, smoker.  09/29/2015  . Other disorders of lung 09/29/2015  . Other microscopic hematuria 08/26/2015  . Kidney stone 08/26/2015  . Urge incontinence 08/26/2015  . Pneumonia 01/24/2015  . PNA (pneumonia) 01/24/2015  . Essential (primary) hypertension 12/09/2014  . Adnexal mass 11/28/2013  . History of cardiac catheterization 11/28/2013  . Hyperlipidemia 11/28/2013  . Other specified postprocedural states 11/28/2013      1. Acute on chronic respiratory failure with hypoxia patient will be continued on capping going to move towards decannulation this week this was discussed on rounds today. 2. Epilepsy seizure disorder patient has no active seizures noted at this time. 3. Chronic atrial fibrillation rate is controlled 4. Tracheostomy as noted above we will be moving towards decannulation 5. Lobar pneumonia treated with antibiotics   I have personally evaluated the patient, evaluated the laboratory and imaging results and formulated the assessment and plan and placed orders as needed. The Patient requires high complexity decision making for assessment and support. I have discussed the patient on rounds with the Respiratory Staff   Allyne Gee, MD [facility] Pulmonary Critical Care Medicine

## 2017-11-08 ENCOUNTER — Other Ambulatory Visit (HOSPITAL_COMMUNITY): Payer: Medicare Other | Admitting: Internal Medicine

## 2017-11-08 DIAGNOSIS — J181 Lobar pneumonia, unspecified organism: Secondary | ICD-10-CM

## 2017-11-08 DIAGNOSIS — Z930 Tracheostomy status: Secondary | ICD-10-CM

## 2017-11-08 DIAGNOSIS — J9621 Acute and chronic respiratory failure with hypoxia: Secondary | ICD-10-CM

## 2017-11-08 DIAGNOSIS — R509 Fever, unspecified: Secondary | ICD-10-CM

## 2017-11-08 DIAGNOSIS — I482 Chronic atrial fibrillation, unspecified: Secondary | ICD-10-CM

## 2017-11-08 DIAGNOSIS — G40909 Epilepsy, unspecified, not intractable, without status epilepticus: Secondary | ICD-10-CM

## 2017-11-08 NOTE — Progress Notes (Signed)
[place] NOTE  PULMONARY SERVICE ROUNDS  Date of Service: 11/08/2017  Melissa Cochran  DOB: [birth year]  Referring physician: Deanne Coffer, MD  HPI: Melissa Cochran is a 82 y.o. female  being seen for Acute on Chronic Respiratory Failure.  Patient is doing fine with capping trials is been on 2 L nasal cannula without distress.  Review of Systems: Unremarkable other than noted in HPI  Allergies:  [facility] on the [facility]  Medications: Reviewed  Vitals: Temperature 101.6 pulse 122 respiratory rate 24 blood pressure 120/70 saturations 98%  Ventilator Settings: Capping as already noted 2 L nasal cannula  Physical Exam: . General:  calm and comfortable NAD . Eyes: normal lids, irises & conjunctiva . ENT: grossly normal tongue not enlarged . Neck: no masses . Cardiovascular: S1 S2 Normal no rubs no gallop . Respiratory: No rhonchi or rales are noted at this time. . Abdomen: soft non-distended . Skin: no rash seen on limited exam . Musculoskeletal:  no rigidity . Psychiatric: unable to assess . Neurologic: no involuntary movements          Lab Data and radiological Data:  Clinical data and status reviewed   Assessment/Plan  Patient Active Problem List   Diagnosis Date Noted  . E-coli UTI, ESBL  06/30/2017  . A-fib ([place]) 06/30/2017  . Need for protective airway ventilation 06/30/2017  . Seizures ([place]) 06/26/2017  . Acute renal failure ([place])   . Elevated troponin I level   . Acute renal failure with tubular necrosis ([place])   . Palliative care encounter   . Pressure injury of skin 02/17/2017  . Bilateral lower extremity edema 10/08/2016  . Venous ulcer of ankle ([place]) 07/08/2016  . DVT (deep venous thrombosis) ([place]) 07/02/2016  . Swelling of limb 06/08/2016  . Pain in limb 06/08/2016  . Altered mental status 01/22/2016  . S/P coronary artery stent placement 10/30/2015  . Preop cardiovascular exam 10/08/2015  . SOB (shortness of  breath) on exertion 10/08/2015  . 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 Mass of lower lobe of left lung, incidetnal on CT 09/2015, smoker.  09/29/2015  . Other disorders of lung 09/29/2015  . Other microscopic hematuria 08/26/2015  . Kidney stone 08/26/2015  . Urge incontinence 08/26/2015  . Pneumonia 01/24/2015  . PNA (pneumonia) 01/24/2015  . Essential (primary) hypertension 12/09/2014  . Adnexal mass 11/28/2013  . History of cardiac catheterization 11/28/2013  . Hyperlipidemia 11/28/2013  . Other specified postprocedural states 11/28/2013      1. Acute on chronic respiratory failure with hypoxia patient is doing fine with capping however right now has a temperature so therefore decannulation will be held off.  Were going to wait towards the end of the week to reassess potential for decannulation.  We will continue with full supportive care at this time. 2. Fever has temperature will review patient's medications and discussed with the primary care team regarding antibiotics if necessary.  Continue with aggressive pulmonary toilet supportive care.  She does have a history of previous pneumonia we will follow-up on that as noted above with the primary care team 3. Epilepsy no active seizures noted at this time. 4. Chronic right control for atrial fibrillation. 5. Tracheostomy hold off on decannulation for now. 6. Lobar pneumonia treated previously with antibiotics we will as mentioned above has a fever discussed with the primary care team regarding assessment and further management.   I have personally evaluated the patient, evaluated the  laboratory and imaging results and formulated the assessment and plan and placed orders as needed. The Patient requires high complexity decision making for assessment and support. I have discussed the patient on rounds with the Respiratory Staff time 35 minutes discussion with the medical staff as well as the treatment treatment care  team and reviewed of the [place], MD [facility] Pulmonary Critical Care Medicine

## 2017-11-09 ENCOUNTER — Other Ambulatory Visit (HOSPITAL_COMMUNITY): Payer: Medicare Other | Admitting: Internal Medicine

## 2017-11-09 DIAGNOSIS — I482 Chronic atrial fibrillation, unspecified: Secondary | ICD-10-CM

## 2017-11-09 DIAGNOSIS — Z930 Tracheostomy status: Secondary | ICD-10-CM

## 2017-11-09 DIAGNOSIS — J181 Lobar pneumonia, unspecified organism: Secondary | ICD-10-CM

## 2017-11-09 DIAGNOSIS — R509 Fever, unspecified: Secondary | ICD-10-CM

## 2017-11-09 DIAGNOSIS — G40909 Epilepsy, unspecified, not intractable, without status epilepticus: Secondary | ICD-10-CM

## 2017-11-09 DIAGNOSIS — J9621 Acute and chronic respiratory failure with hypoxia: Secondary | ICD-10-CM

## 2017-11-09 NOTE — Progress Notes (Signed)
Melissa Cochran  PULMONARY SERVICE ROUNDS  Date of Service: 11/09/2017  Melissa Cochran  DOB: 05/12/1935  Referring physician: Deanne Coffer, MD  HPI: Melissa Cochran is a 82 y.o. female  being seen for Acute on Chronic Respiratory Failure.  Patient is capping has been on 1 L.  Unfortunately now has a fever of 102.9  Review of Systems: Unremarkable other than noted in HPI  Allergies:  Reviewed on the [facility]  Medications: Reviewed  Vitals: Temperature 102.9 pulse 136 respiratory rate 20 blood pressure 110/58 saturations 96%  Ventilator Settings: Currently is capping  Physical Exam: . General:  calm and comfortable NAD . Eyes: normal lids, irises & conjunctiva . ENT: grossly normal tongue not enlarged . Neck: no masses . Cardiovascular: S1 S2 Normal no rubs no gallop . Respiratory: No rhonchi or rales are noted . Abdomen: soft non-distended . Skin: no rash seen on limited exam . Musculoskeletal:  no rigidity . Psychiatric: unable to assess . Neurologic: no involuntary movements          Lab Data and radiological Data:  Data reviewed   Assessment/Plan  Patient Active Problem List   Diagnosis Date Noted  . E-coli UTI, ESBL  06/30/2017  . A-fib ([place]) 06/30/2017  . Need for protective airway ventilation 06/30/2017  . Seizures ([place]) 06/26/2017  . Acute renal failure ([place])   . Elevated troponin I level   . Acute renal failure with tubular necrosis ([place])   . Palliative care encounter   . Pressure injury of skin 02/17/2017  . Bilateral lower extremity edema 10/08/2016  . Venous ulcer of ankle ([place]) 07/08/2016  . DVT (deep venous thrombosis) ([place]) 07/02/2016  . Swelling of limb 06/08/2016  . Pain in limb 06/08/2016  . Altered mental status 01/22/2016  . S/P coronary artery stent placement 10/30/2015  . Preop cardiovascular exam 10/08/2015  . SOB (shortness of breath) on exertion 10/08/2015  . 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 Mass of lower lobe of left lung, incidetnal on CT 09/2015, smoker.  09/29/2015  . Other disorders of lung 09/29/2015  . Other microscopic hematuria 08/26/2015  . Kidney stone 08/26/2015  . Urge incontinence 08/26/2015  . Pneumonia 01/24/2015  . PNA (pneumonia) 01/24/2015  . Essential (primary) hypertension 12/09/2014  . Adnexal mass 11/28/2013  . History of cardiac catheterization 11/28/2013  . Hyperlipidemia 11/28/2013  . Other specified postprocedural states 11/28/2013      1. Acute on chronic respiratory failure with hypoxia patient should be able to hopefully be continue with capping work towards decannulation however with the fever this will be worked up prior to decannulate 2. Fevers patient empirically started on antibiotics we will continue to monitor. 3. Epilepsy no active seizures. 4. Lobar pneumonia treated with antibiotics 5. Chronic atrial fibrillation rate is controlled we will monitor 6. Tracheostomy continue with supportive care pulmonary toilet   I have personally evaluated the patient, evaluated the laboratory and imaging results and formulated the assessment and plan and placed orders as needed. The Patient requires high complexity decision making for assessment and support. I have discussed the patient on rounds with the Respiratory Staff   Allyne Gee, MD [facility] Pulmonary Critical Care Medicine

## 2017-11-15 ENCOUNTER — Other Ambulatory Visit (HOSPITAL_COMMUNITY): Payer: Medicare Other | Admitting: Internal Medicine

## 2017-11-15 DIAGNOSIS — I482 Chronic atrial fibrillation, unspecified: Secondary | ICD-10-CM

## 2017-11-15 DIAGNOSIS — G40309 Generalized idiopathic epilepsy and epileptic syndromes, not intractable, without status epilepticus: Secondary | ICD-10-CM

## 2017-11-15 DIAGNOSIS — J9621 Acute and chronic respiratory failure with hypoxia: Secondary | ICD-10-CM

## 2017-11-15 DIAGNOSIS — R509 Fever, unspecified: Secondary | ICD-10-CM

## 2017-11-15 DIAGNOSIS — J181 Lobar pneumonia, unspecified organism: Secondary | ICD-10-CM

## 2017-11-15 DIAGNOSIS — Z930 Tracheostomy status: Secondary | ICD-10-CM

## 2017-11-15 NOTE — Progress Notes (Signed)
[place] NOTE  PULMONARY SERVICE ROUNDS  Date of Service: 11/15/2017  Melissa Cochran  DOB: 1936/05/02  Referring physician: Deanne Coffer, MD  HPI: Melissa Cochran is a 82 y.o. female  being seen for Acute on Chronic Respiratory Failure.  She is back on the ventilator.  Patient has increased work of breathing noted increased respiratory rate.  Patient was placed back on 40% FiO2 due to low saturations.  Review of Systems: Unremarkable other than noted in HPI  Allergies:  Reviewed on the [facility]  Medications: Reviewed  Vitals: Temperature 97.7 pulse 134 respiratory rate 28 blood pressure 120/80 saturations 90%  Ventilator Settings: Mode of ventilation pressure assist control FiO2 40% tidal volume 380 PEEP 5  Physical Exam: . General:  calm and comfortable NAD . Eyes: normal lids, irises & conjunctiva . ENT: grossly normal tongue not enlarged . Neck: no masses . Cardiovascular: S1 S2 Normal no rubs no gallop . Respiratory: No rhonchi or rales are noted at this time. . Abdomen: soft non-distended . Skin: no rash seen on limited exam . Musculoskeletal:  no rigidity . Psychiatric: unable to assess . Neurologic: no involuntary movements          Lab Data and radiological Data:  Sodium 149 potassium 5.3 BUN 51 creatinine 0.5 White count 15.6 hemoglobin 10.6 hematocrit 40.1 platelet count 415   Assessment/Plan  Patient Active Problem List   Diagnosis Date Noted  . E-coli UTI, ESBL  06/30/2017  . A-fib ([place]) 06/30/2017  . Need for protective airway ventilation 06/30/2017  . Seizures ([place]) 06/26/2017  . Acute renal failure ([place])   . Elevated troponin I level   . Acute renal failure with tubular necrosis ([place])   . Palliative care encounter   . Pressure injury of skin 02/17/2017  . Bilateral lower extremity edema 10/08/2016  . Venous ulcer of ankle ([place]) 07/08/2016  . DVT (deep venous thrombosis) ([place]) 07/02/2016  . Swelling of  limb 06/08/2016  . Pain in limb 06/08/2016  . Altered mental status 01/22/2016  . S/P coronary artery stent placement 10/30/2015  . Preop cardiovascular exam 10/08/2015  . SOB (shortness of breath) on exertion 10/08/2015  . Malignant neoplasm of trigone of bladder ([place]) 09/29/2015  . Mass of lower lobe of left lung, incidetnal on CT 09/2015, smoker.  09/29/2015  . Other disorders of lung 09/29/2015  . Other microscopic hematuria 08/26/2015  . Kidney stone 08/26/2015  . Urge incontinence 08/26/2015  . Pneumonia 01/24/2015  . PNA (pneumonia) 01/24/2015  . Essential (primary) hypertension 12/09/2014  . Adnexal mass 11/28/2013  . History of cardiac catheterization 11/28/2013  . Hyperlipidemia 11/28/2013  . Other specified postprocedural states 11/28/2013      1. Acute on chronic respiratory failure with hypoxia we will continue with full vent support for now.  Reassess in the morning.  Follow-up ABG as necessary.  Continue pulmonary toilet secretion management. 2. Fever patient still has low-grade fever noted we will continue with supportive care.  Acute decompensation follow-up chest x-ray. 3. Epilepsy no active seizures 4. Lobar pneumonia treated with antibiotics follow-up x-ray 5. Chronic atrial fibrillation rate is controlled 6. Tracheostomy will remain in place hold off on decannulation in fact I think that she may need to keep the tracheostomy in place   I have personally evaluated the patient, evaluated the laboratory and imaging results and formulated the assessment and plan and placed orders as needed. The Patient requires high complexity decision making for assessment and  support. I have discussed the patient on rounds with the Respiratory Staff   Allyne Gee, MD [facility] Pulmonary Critical Care Medicine

## 2017-11-16 ENCOUNTER — Other Ambulatory Visit (HOSPITAL_COMMUNITY): Payer: Medicare Other | Admitting: Internal Medicine

## 2017-11-16 DIAGNOSIS — J9621 Acute and chronic respiratory failure with hypoxia: Secondary | ICD-10-CM

## 2017-11-16 DIAGNOSIS — Z930 Tracheostomy status: Secondary | ICD-10-CM

## 2017-11-16 DIAGNOSIS — G40909 Epilepsy, unspecified, not intractable, without status epilepticus: Secondary | ICD-10-CM

## 2017-11-16 DIAGNOSIS — I482 Chronic atrial fibrillation, unspecified: Secondary | ICD-10-CM

## 2017-11-16 DIAGNOSIS — Z789 Other specified health status: Secondary | ICD-10-CM

## 2017-11-16 DIAGNOSIS — J181 Lobar pneumonia, unspecified organism: Secondary | ICD-10-CM

## 2017-11-16 NOTE — Progress Notes (Signed)
[place] NOTE  PULMONARY SERVICE ROUNDS  Date of Service: 11/16/2017  LARINA LIEURANCE  DOB: [birth year]  Referring physician: Deanne Coffer, MD  HPI: PHILLIPA MORDEN is a 82 y.o. female  being seen for Acute on Chronic Respiratory Failure.  Currently on full support is on pressure assist control mode has been on 40% FiO2.  We should be able to hopefully start weaning protocol back again to try to get her off the ventilator.  Review of Systems: Unremarkable other than noted in HPI  Allergies:  Reviewed on the [facility]  Medications: Reviewed  Vitals: Temperature 98.7 pulse 109 respiratory rate 27 blood pressure 117/66 saturation 99%  Ventilator Settings: Mode of ventilation pressure assist control FiO2 40% tidal volume 413 PEEP 5  Physical Exam: . General:  calm and comfortable NAD . Eyes: normal lids, irises & conjunctiva . ENT: grossly normal tongue not enlarged . Neck: no masses . Cardiovascular: S1 S2 Normal no rubs no gallop . Respiratory: No rhonchi or rales . Abdomen: soft non-distended . Skin: no rash seen on limited exam . Musculoskeletal:  no rigidity . Psychiatric: unable to assess . Neurologic: no involuntary movements          Lab Data and radiological Data:  [place]   Assessment/Plan  Patient Active Problem List   Diagnosis Date Noted  . E-coli UTI, ESBL  06/30/2017  . A-fib ([place]) 06/30/2017  . Need for protective airway ventilation 06/30/2017  . Seizures ([place]) 06/26/2017  . Acute renal failure ([place])   . Elevated troponin I level   . Acute renal failure with tubular necrosis ([place])   . Palliative care encounter   . Pressure injury of skin 02/17/2017  . Bilateral lower extremity edema 10/08/2016  . Venous ulcer of ankle ([place]) 07/08/2016  . DVT (deep venous thrombosis) ([place]) 07/02/2016  . Swelling of limb 06/08/2016  . Pain in limb 06/08/2016  . Altered mental status 01/22/2016  . S/P coronary artery stent  placement 10/30/2015  . Preop cardiovascular exam 10/08/2015  . SOB (shortness of breath) on exertion 10/08/2015  . Malignant neoplasm of trigone of bladder ([place]) 09/29/2015  . Mass of lower lobe of left lung, incidetnal on CT 09/2015, smoker.  09/29/2015  . Other disorders of lung 09/29/2015  . Other microscopic hematuria 08/26/2015  . Kidney stone 08/26/2015  . Urge incontinence 08/26/2015  . Pneumonia 01/24/2015  . PNA (pneumonia) 01/24/2015  . Essential (primary) hypertension 12/09/2014  . Adnexal mass 11/28/2013  . History of cardiac catheterization 11/28/2013  . Hyperlipidemia 11/28/2013  . Other specified postprocedural states 11/28/2013      1. Acute on chronic respiratory failure with hypoxia patient is on the ventilator at this time will resume the wean and get her back to T collar.  Continue pulmonary toilet secretion management supportive care. 2. Fever improved we will continue to monitor 3. Epilepsy no active seizures 4. Lobar pneumonia treated 5. Chronic atrial fibrillation rate is controlled 6. Tracheostomy will need to remain in place as discussed on rounds   I have personally evaluated the patient, evaluated the laboratory and imaging results and formulated the assessment and plan and placed orders as needed. The Patient requires high complexity decision making for assessment and support. I have discussed the patient on rounds with the Respiratory Staff   Allyne Gee, MD [facility] Pulmonary Critical Care Medicine

## 2017-11-17 ENCOUNTER — Other Ambulatory Visit (HOSPITAL_COMMUNITY): Payer: Medicare Other | Admitting: Internal Medicine

## 2017-11-17 DIAGNOSIS — I482 Chronic atrial fibrillation, unspecified: Secondary | ICD-10-CM

## 2017-11-17 DIAGNOSIS — Z930 Tracheostomy status: Secondary | ICD-10-CM

## 2017-11-17 DIAGNOSIS — G40909 Epilepsy, unspecified, not intractable, without status epilepticus: Secondary | ICD-10-CM

## 2017-11-17 DIAGNOSIS — J181 Lobar pneumonia, unspecified organism: Secondary | ICD-10-CM

## 2017-11-17 DIAGNOSIS — R509 Fever, unspecified: Secondary | ICD-10-CM

## 2017-11-17 DIAGNOSIS — J9621 Acute and chronic respiratory failure with hypoxia: Secondary | ICD-10-CM

## 2017-11-17 NOTE — Progress Notes (Signed)
[place] NOTE  PULMONARY SERVICE ROUNDS  Date of Service: 11/17/2017  Melissa Cochran  DOB: [birth year]  Referring physician: Deanne Coffer, MD  HPI: Melissa Cochran is a 82 y.o. female  being seen for Acute on Chronic Respiratory Failure.  Patient is still on the ventilator has been on pressure assist control mode.  Not tolerating weaning at this time.  Patient's been requiring 40% FiO2.  Currently is on a PEEP of 5  Review of Systems: Unremarkable other than noted in HPI  Allergies:  Reviewed on the [facility]  Medications: Reviewed  Vitals: Temperature 98.2 pulse 92 respiratory rate 19 blood pressure 106/64 saturations 99%  Ventilator Settings: Mode of ventilation pressure assist control FiO2 40% tidal volume 450 PEEP 5  Physical Exam: . General:  calm and comfortable NAD . Eyes: normal lids, irises & conjunctiva . ENT: grossly normal tongue not enlarged . Neck: no masses . Cardiovascular: S1 S2 Normal no rubs no gallop . Respiratory: No rhonchi or rales are noted at this time . Abdomen: soft non-distended . Skin: no rash seen on limited exam . Musculoskeletal:  no rigidity . Psychiatric: unable to assess . Neurologic: no involuntary movements          Lab Data and radiological Data:  Laboratory data has been reviewed   Assessment/Plan  Patient Active Problem List   Diagnosis Date Noted  . E-coli UTI, ESBL  06/30/2017  . A-fib ([place]) 06/30/2017  . Need for protective airway ventilation 06/30/2017  . Seizures ([place]) 06/26/2017  . Acute renal failure ([place])   . Elevated troponin I level   . Acute renal failure with tubular necrosis ([place])   . Palliative care encounter   . Pressure injury of skin 02/17/2017  . Bilateral lower extremity edema 10/08/2016  . Venous ulcer of ankle ([place]) 07/08/2016  . DVT (deep venous thrombosis) ([place]) 07/02/2016  . Swelling of limb 06/08/2016  . Pain in limb 06/08/2016  . Altered mental status  01/22/2016  . S/P coronary artery stent placement 10/30/2015  . Preop cardiovascular exam 10/08/2015  . SOB (shortness of breath) on exertion 10/08/2015  . Malignant neoplasm of trigone of bladder ([place]) 09/29/2015  . Mass of lower lobe of left lung, incidetnal on CT 09/2015, smoker.  09/29/2015  . Other disorders of lung 09/29/2015  . Other microscopic hematuria 08/26/2015  . Kidney stone 08/26/2015  . Urge incontinence 08/26/2015  . Pneumonia 01/24/2015  . PNA (pneumonia) 01/24/2015  . Essential (primary) hypertension 12/09/2014  . Adnexal mass 11/28/2013  . History of cardiac catheterization 11/28/2013  . Hyperlipidemia 11/28/2013  . Other specified postprocedural states 11/28/2013      1. Acute on chronic respiratory failure with hypoxia patient is going to continue on full support currently on pressure assist control mode.  Patient has been on 40% FiO2 with a PEEP of 5. 2. Fever resolved we will continue with supportive care. 3. Epilepsy at baseline no seizures 4. Lobar pneumonia treated no fever noted 5. Chronic atrial fibrillation rate is controlled 6. Tracheostomy we will continue present management   I have personally evaluated the patient, evaluated the laboratory and imaging results and formulated the assessment and plan and placed orders as needed. The Patient requires high complexity decision making for assessment and support. I have discussed the patient on rounds with the Respiratory Staff   Allyne Gee, MD [facility] Pulmonary Critical Care Medicine

## 2017-11-18 ENCOUNTER — Other Ambulatory Visit (HOSPITAL_COMMUNITY): Payer: Medicare Other | Admitting: Internal Medicine

## 2017-11-18 DIAGNOSIS — J9621 Acute and chronic respiratory failure with hypoxia: Secondary | ICD-10-CM

## 2017-11-18 DIAGNOSIS — I482 Chronic atrial fibrillation, unspecified: Secondary | ICD-10-CM

## 2017-11-18 DIAGNOSIS — J181 Lobar pneumonia, unspecified organism: Secondary | ICD-10-CM

## 2017-11-18 DIAGNOSIS — G40909 Epilepsy, unspecified, not intractable, without status epilepticus: Secondary | ICD-10-CM

## 2017-11-18 NOTE — Progress Notes (Signed)
[name] NOTE  PULMONARY SERVICE ROUNDS  Date of Service: 11/18/2017  Melissa Cochran  DOB: 04-Jan-1936  Referring physician: Deanne Coffer, MD  HPI: Melissa Cochran is a 82 y.o. female  being seen for Acute on Chronic Respiratory Failure.  She remains on the ventilator at this time patient has been on pressure assist control mode currently is on 40% oxygen.  Review of Systems: Unremarkable other than noted in HPI  Allergies:  Reviewed on the [facility]  Medications: Reviewed  Vitals: Temperature 98.6 pulse 92 respiratory rate 20 blood pressure 108/60 saturations 99%  Ventilator Settings: Currently on pressure assist control FiO2 40% tidal volume 436 PEEP 5  Physical Exam: . General:  calm and comfortable NAD . Eyes: normal lids, irises & conjunctiva . ENT: grossly normal tongue not enlarged . Neck: no masses . Cardiovascular: S1 S2 Normal no rubs no gallop . Respiratory: No rhonchi or rales . Abdomen: soft non-distended . Skin: no rash seen on limited exam . Musculoskeletal:  no rigidity . Psychiatric: unable to assess . Neurologic: no involuntary movements          Lab Data and radiological Data:  Sodium 136 potassium 4.2 BUN 29 creatinine 0.5   Assessment/Plan  Patient Active Problem List   Diagnosis Date Noted  . E-coli UTI, ESBL  06/30/2017  . A-fib ([place]) 06/30/2017  . Need for protective airway ventilation 06/30/2017  . Seizures ([place]) 06/26/2017  . Acute renal failure ([place])   . Elevated troponin I level   . Acute renal failure with tubular necrosis ([place])   . Palliative care encounter   . Pressure injury of skin 02/17/2017  . Bilateral lower extremity edema 10/08/2016  . Venous ulcer of ankle ([place]) 07/08/2016  . DVT (deep venous thrombosis) ([place]) 07/02/2016  . Swelling of limb 06/08/2016  . Pain in limb 06/08/2016  . Altered mental status 01/22/2016  . S/P coronary artery stent placement 10/30/2015  . Preop  cardiovascular exam 10/08/2015  . SOB (shortness of breath) on exertion 10/08/2015  . Malignant neoplasm of trigone of bladder ([place]) 09/29/2015  . Mass of lower lobe of left lung, incidetnal on CT 09/2015, smoker.  09/29/2015  . Other disorders of lung 09/29/2015  . Other microscopic hematuria 08/26/2015  . Kidney stone 08/26/2015  . Urge incontinence 08/26/2015  . Pneumonia 01/24/2015  . PNA (pneumonia) 01/24/2015  . Essential (primary) hypertension 12/09/2014  . Adnexal mass 11/28/2013  . History of cardiac catheterization 11/28/2013  . Hyperlipidemia 11/28/2013  . Other specified postprocedural states 11/28/2013      1. Acute on chronic respiratory failure with hypoxia continues on full support we will continue on the ventilator at this time. 2. Febrile resolved we will continue present management. 3. Epilepsy no seizures at this time. 4. Lobar pneumonia treated 5. Chronic atrial fibrillation rate is controlled 6. Tracheostomy at baseline   I have personally evaluated the patient, evaluated the laboratory and imaging results and formulated the assessment and plan and placed orders as needed. The Patient requires high complexity decision making for assessment and support. I have discussed the patient on rounds with the Respiratory Staff   Allyne Gee, MD [facility] Pulmonary Critical Care Medicine

## 2017-11-19 ENCOUNTER — Other Ambulatory Visit (HOSPITAL_COMMUNITY): Payer: Medicare Other | Admitting: Internal Medicine

## 2017-11-19 DIAGNOSIS — I482 Chronic atrial fibrillation, unspecified: Secondary | ICD-10-CM

## 2017-11-19 DIAGNOSIS — J9621 Acute and chronic respiratory failure with hypoxia: Secondary | ICD-10-CM

## 2017-11-19 DIAGNOSIS — Z930 Tracheostomy status: Secondary | ICD-10-CM

## 2017-11-19 DIAGNOSIS — G40909 Epilepsy, unspecified, not intractable, without status epilepticus: Secondary | ICD-10-CM

## 2017-11-19 DIAGNOSIS — J181 Lobar pneumonia, unspecified organism: Secondary | ICD-10-CM

## 2017-11-19 NOTE — Progress Notes (Signed)
[place] NOTE  PULMONARY SERVICE ROUNDS  Date of Service: 11/19/2017  Melissa Cochran  DOB: 1936/05/01  Referring physician: Deanne Coffer, MD  HPI: Melissa Cochran is a 82 y.o. female  being seen for Acute on Chronic Respiratory Failure.  She continues to remain on the ventilator and pressure assist control mode has not been tolerating any weaning attempts.  Secretions have been fair to moderate  Review of Systems: Unremarkable other than noted in HPI  Allergies:  Reviewed on the [facility]  Medications: Reviewed  Vitals: Temperature 98.6 pulse 84 respiratory rate 20 blood pressure 110/70 saturations 99%  Ventilator Settings: Mode of ventilation pressure assist control FiO2 30% tidal volume 456 PEEP 5  Physical Exam: . General:  calm and comfortable NAD . Eyes: normal lids, irises & conjunctiva . ENT: grossly normal tongue not enlarged . Neck: no masses . Cardiovascular: S1 S2 Normal no rubs no gallop . Respiratory: Coarse breath sounds noted bilaterally . Abdomen: soft non-distended . Skin: no rash seen on limited exam . Musculoskeletal:  no rigidity . Psychiatric: unable to assess . Neurologic: no involuntary movements          Lab Data and radiological Data:  Labs have been reviewed   Assessment/Plan  Patient Active Problem List   Diagnosis Date Noted  . E-coli UTI, ESBL  06/30/2017  . A-fib ([place]) 06/30/2017  . Need for protective airway ventilation 06/30/2017  . Seizures ([place]) 06/26/2017  . Acute renal failure ([place])   . Elevated troponin I level   . Acute renal failure with tubular necrosis ([place])   . Palliative care encounter   . Pressure injury of skin 02/17/2017  . Bilateral lower extremity edema 10/08/2016  . Venous ulcer of ankle ([place]) 07/08/2016  . DVT (deep venous thrombosis) ([place]) 07/02/2016  . Swelling of limb 06/08/2016  . Pain in limb 06/08/2016  . Altered mental status 01/22/2016  . S/P coronary artery  stent placement 10/30/2015  . Preop cardiovascular exam 10/08/2015  . SOB (shortness of breath) on exertion 10/08/2015  . Malignant neoplasm of trigone of bladder ([place]) 09/29/2015  . Mass of lower lobe of left lung, incidetnal on CT 09/2015, smoker.  09/29/2015  . Other disorders of lung 09/29/2015  . Other microscopic hematuria 08/26/2015  . Kidney stone 08/26/2015  . Urge incontinence 08/26/2015  . Pneumonia 01/24/2015  . PNA (pneumonia) 01/24/2015  . Essential (primary) hypertension 12/09/2014  . Adnexal mass 11/28/2013  . History of cardiac catheterization 11/28/2013  . Hyperlipidemia 11/28/2013  . Other specified postprocedural states 11/28/2013      1. Acute on chronic respiratory failure with hypoxia patient is still not tolerating the wean right now remains on full support and pressure assist control mode as noted above.  We will try to titrate the oxygen as tolerated and continue with supportive care. 2. Epilepsy stable no active seizures are noted at this time. 3. Lobar pneumonia treated with antibiotics clinically improved 4. Chronic atrial fibrillation rate is controlled we will continue to follow 5. Tracheostomy at baseline right now on the ventilator   I have personally evaluated the patient, evaluated the laboratory and imaging results and formulated the assessment and plan and placed orders as needed. The Patient requires high complexity decision making for assessment and support. I have discussed the patient on rounds with the Respiratory Staff   Allyne Gee, MD [facility] Pulmonary Critical Care Medicine

## 2017-11-21 ENCOUNTER — Other Ambulatory Visit (HOSPITAL_COMMUNITY): Payer: Medicare Other | Admitting: Internal Medicine

## 2017-11-21 DIAGNOSIS — I482 Chronic atrial fibrillation, unspecified: Secondary | ICD-10-CM

## 2017-11-21 DIAGNOSIS — J181 Lobar pneumonia, unspecified organism: Secondary | ICD-10-CM

## 2017-11-21 DIAGNOSIS — J9621 Acute and chronic respiratory failure with hypoxia: Secondary | ICD-10-CM

## 2017-11-21 DIAGNOSIS — Z930 Tracheostomy status: Secondary | ICD-10-CM

## 2017-11-21 DIAGNOSIS — G40909 Epilepsy, unspecified, not intractable, without status epilepticus: Secondary | ICD-10-CM

## 2017-11-21 NOTE — Progress Notes (Signed)
[place] NOTE  PULMONARY SERVICE ROUNDS  Date of Service: 11/21/2017  Melissa Cochran  DOB: [birth year]  Referring physician: Deanne Coffer, MD  HPI: Melissa Cochran is a 82 y.o. female  being seen for Acute on Chronic Respiratory Failure.  She remains on the ventilator full support she is not able to wean so far.  Remains on pressure assist control mode.  Case management is looking at possible discharge in the coming week.  She will likely need to go to event SNF facility  Review of Systems: Unremarkable other than noted in HPI  Allergies:  Reviewed on the [facility]  Medications: Reviewed  Vitals: Temperature 97.1 pulse 93 respiratory rate 25 blood pressure 110/60 saturations 99%  Ventilator Settings: Mode of ventilation pressure assist control FiO2 30% tidal volume 410 PEEP 5  Physical Exam: . General:  calm and comfortable NAD . Eyes: normal lids, irises & conjunctiva . ENT: grossly normal tongue not enlarged . Neck: no masses . Cardiovascular: S1 S2 Normal no rubs no gallop . Respiratory: No rhonchi or rales noted at this time. . Abdomen: soft non-distended . Skin: no rash seen on limited exam . Musculoskeletal:  no rigidity . Psychiatric: unable to assess . Neurologic: no involuntary movements          Lab Data and radiological Data:  Sodium 136 potassium 4.2 BUN 39 creatinine 0.5   Assessment/Plan  Patient Active Problem List   Diagnosis Date Noted  . E-coli UTI, ESBL  06/30/2017  . A-fib ([place]) 06/30/2017  . Need for protective airway ventilation 06/30/2017  . Seizures ([place]) 06/26/2017  . Acute renal failure ([place])   . Elevated troponin I level   . Acute renal failure with tubular necrosis ([place])   . Palliative care encounter   . Pressure injury of skin 02/17/2017  . Bilateral lower extremity edema 10/08/2016  . Venous ulcer of ankle ([place]) 07/08/2016  . DVT (deep venous thrombosis) ([place]) 07/02/2016  . Swelling of limb  06/08/2016  . Pain in limb 06/08/2016  . Altered mental status 01/22/2016  . S/P coronary artery stent placement 10/30/2015  . Preop cardiovascular exam 10/08/2015  . SOB (shortness of breath) on exertion 10/08/2015  . Malignant neoplasm of trigone of bladder ([place]) 09/29/2015  . Mass of lower lobe of left lung, incidetnal on CT 09/2015, smoker.  09/29/2015  . Other disorders of lung 09/29/2015  . Other microscopic hematuria 08/26/2015  . Kidney stone 08/26/2015  . Urge incontinence 08/26/2015  . Pneumonia 01/24/2015  . PNA (pneumonia) 01/24/2015  . Essential (primary) hypertension 12/09/2014  . Adnexal mass 11/28/2013  . History of cardiac catheterization 11/28/2013  . Hyperlipidemia 11/28/2013  . Other specified postprocedural states 11/28/2013      1. Acute on chronic respiratory failure with hypoxia patient is currently on full vent support.  Her mechanics have been poor patient is not able to wean we will continue with assessing the spontaneous breathing index and wean as tolerated. 2. Lobar pneumonia treated with antibiotics continue supportive care 3. Epilepsy no active seizures we will continue with present management. 4. Chronic atrial fibrillation rate is controlled 5. Tracheostomy will be continued   I have personally evaluated the patient, evaluated the laboratory and imaging results and formulated the assessment and plan and placed orders as needed. The Patient requires high complexity decision making for assessment and support. I have discussed the patient on rounds with the Respiratory Staff   Allyne Gee, MD [facility] Pulmonary  Critical Care Medicine

## 2017-11-22 ENCOUNTER — Other Ambulatory Visit (HOSPITAL_COMMUNITY): Payer: Medicare Other | Admitting: Internal Medicine

## 2017-11-22 DIAGNOSIS — I482 Chronic atrial fibrillation, unspecified: Secondary | ICD-10-CM

## 2017-11-22 DIAGNOSIS — J9621 Acute and chronic respiratory failure with hypoxia: Secondary | ICD-10-CM

## 2017-11-22 DIAGNOSIS — Z930 Tracheostomy status: Secondary | ICD-10-CM

## 2017-11-22 DIAGNOSIS — J181 Lobar pneumonia, unspecified organism: Secondary | ICD-10-CM

## 2017-11-22 DIAGNOSIS — G40909 Epilepsy, unspecified, not intractable, without status epilepticus: Secondary | ICD-10-CM

## 2017-11-22 NOTE — Progress Notes (Signed)
[place] NOTE  PULMONARY SERVICE ROUNDS  Date of Service: 11/22/2017  ODILIA DAMICO  DOB: 1935/12/07  Referring physician: Deanne Coffer, MD  HPI: KENNETTE CUTHRELL is a 82 y.o. female  being seen for Acute on Chronic Respiratory Failure.  She remains on full support currently on pressure assist control patient is on 30% FiO2.  Review of Systems: Unremarkable other than noted in HPI  Allergies:  Reviewed on the [facility]  Medications: Reviewed  Vitals: Temperature 99.2 pulse 95 respiratory rate 18 blood pressure 122/64 saturations 98%  Ventilator Settings: Mode of ventilation pressure assist control FiO2 30% tidal volume 476 PEEP 5  Physical Exam: . General:  calm and comfortable NAD . Eyes: normal lids, irises & conjunctiva . ENT: grossly normal tongue not enlarged . Neck: no masses . Cardiovascular: S1 S2 Normal no rubs no gallop . Respiratory: No rhonchi or rales . Abdomen: soft non-distended . Skin: no rash seen on limited exam . Musculoskeletal:  no rigidity . Psychiatric: unable to assess . Neurologic: no involuntary movements          Lab Data and radiological Data:  Data reviewed   Assessment/Plan  Patient Active Problem List   Diagnosis Date Noted  . E-coli UTI, ESBL  06/30/2017  . A-fib ([place]) 06/30/2017  . Need for protective airway ventilation 06/30/2017  . Seizures ([place]) 06/26/2017  . Acute renal failure ([place])   . Elevated troponin I level   . Acute renal failure with tubular necrosis ([place])   . Palliative care encounter   . Pressure injury of skin 02/17/2017  . Bilateral lower extremity edema 10/08/2016  . Venous ulcer of ankle ([place]) 07/08/2016  . DVT (deep venous thrombosis) ([place]) 07/02/2016  . Swelling of limb 06/08/2016  . Pain in limb 06/08/2016  . Altered mental status 01/22/2016  . S/P coronary artery stent placement 10/30/2015  . Preop cardiovascular exam 10/08/2015  . SOB (shortness of breath) on  exertion 10/08/2015  . 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 Mass of lower lobe of left lung, incidetnal on CT 09/2015, smoker.  09/29/2015  . Other disorders of lung 09/29/2015  . Other microscopic hematuria 08/26/2015  . Kidney stone 08/26/2015  . Urge incontinence 08/26/2015  . Pneumonia 01/24/2015  . PNA (pneumonia) 01/24/2015  . Essential (primary) hypertension 12/09/2014  . Adnexal mass 11/28/2013  . History of cardiac catheterization 11/28/2013  . Hyperlipidemia 11/28/2013  . Other specified postprocedural states 11/28/2013      1. Acute on chronic respiratory failure with hypoxia patient is tolerating full vent support at this time.  Spontaneous breathing index is been checked not feasible for weaning at this time we will continue with full support at this stage. 2. Lobar pneumonia treated with antibiotics we will continue to monitor 3. Epilepsy no active seizures 4. Tracheostomy stable 5. Chronic atrial fibrillation rate is controlled   I have personally evaluated the patient, evaluated the laboratory and imaging results and formulated the assessment and plan and placed orders as needed. The Patient requires high complexity decision making for assessment and support. I have discussed the patient on rounds with the Respiratory Staff   Allyne Gee, MD [facility] Pulmonary Critical Care Medicine

## 2017-11-23 ENCOUNTER — Other Ambulatory Visit (HOSPITAL_COMMUNITY): Payer: Medicare Other | Admitting: Internal Medicine

## 2017-11-23 DIAGNOSIS — J181 Lobar pneumonia, unspecified organism: Secondary | ICD-10-CM

## 2017-11-23 DIAGNOSIS — I482 Chronic atrial fibrillation, unspecified: Secondary | ICD-10-CM

## 2017-11-23 DIAGNOSIS — J9621 Acute and chronic respiratory failure with hypoxia: Secondary | ICD-10-CM

## 2017-11-23 DIAGNOSIS — G40909 Epilepsy, unspecified, not intractable, without status epilepticus: Secondary | ICD-10-CM

## 2017-11-23 DIAGNOSIS — Z930 Tracheostomy status: Secondary | ICD-10-CM

## 2017-11-23 NOTE — Progress Notes (Signed)
[place] NOTE  PULMONARY SERVICE ROUNDS  Date of Service: 11/23/2017  Melissa Cochran  DOB: Nov 25, 1935  Referring physician: Deanne Coffer, MD  HPI: Melissa Cochran is a 82 y.o. female  being seen for Acute on Chronic Respiratory Failure.  She remains on full vent support not weaning.  Patient is currently on pressure assist control with any discharge  Review of Systems: Unremarkable other than noted in HPI  Allergies:  Reviewed on the [facility]  Medications: Reviewed  Vitals: Temperature 97.8 pulse 98 respiratory rate 18 blood pressure 110/68 saturations 99%  Ventilator Settings: Mode of ventilation pressure assist control FiO2 30% tidal volume 409 PEEP 5  Physical Exam: . General:  calm and comfortable NAD . Eyes: normal lids, irises & conjunctiva . ENT: grossly normal tongue not enlarged . Neck: no masses . Cardiovascular: S1 S2 Normal no rubs no gallop . Respiratory: No rhonchi or rales at this time. . Abdomen: soft non-distended . Skin: no rash seen on limited exam . Musculoskeletal:  no rigidity . Psychiatric: unable to assess . Neurologic: no involuntary movements          Lab Data and radiological Data:  Sodium 135 potassium 5.3 BUN 35 creatinine 0.4 White count 11.5 hemoglobin 8.5 hematocrit 29 platelet count 274   Assessment/Plan  Patient Active Problem List   Diagnosis Date Noted  . E-coli UTI, ESBL  06/30/2017  . A-fib ([place]) 06/30/2017  . Need for protective airway ventilation 06/30/2017  . Seizures ([place]) 06/26/2017  . Acute renal failure ([place])   . Elevated troponin I level   . Acute renal failure with tubular necrosis ([place])   . Palliative care encounter   . Pressure injury of skin 02/17/2017  . Bilateral lower extremity edema 10/08/2016  . Venous ulcer of ankle ([place]) 07/08/2016  . DVT (deep venous thrombosis) ([place]) 07/02/2016  . Swelling of limb 06/08/2016  . Pain in limb 06/08/2016  . Altered mental  status 01/22/2016  . S/P coronary artery stent placement 10/30/2015  . Preop cardiovascular exam 10/08/2015  . SOB (shortness of breath) on exertion 10/08/2015  . 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 Mass of lower lobe of left lung, incidetnal on CT 09/2015, smoker.  09/29/2015  . Other disorders of lung 09/29/2015  . Other microscopic hematuria 08/26/2015  . Kidney stone 08/26/2015  . Urge incontinence 08/26/2015  . Pneumonia 01/24/2015  . PNA (pneumonia) 01/24/2015  . Essential (primary) hypertension 12/09/2014  . Adnexal mass 11/28/2013  . History of cardiac catheterization 11/28/2013  . Hyperlipidemia 11/28/2013  . Other specified postprocedural states 11/28/2013      1. Acute on chronic respiratory failure with hypoxia we will continue with full support on pressure assist control mode patient secretions are still copious has not been able to wean.  She is going to remain on the full pressure assist control mode. 2. Lobar pneumonia treated with antibiotics we will continue with present management. 3. Epilepsy grossly unchanged no active seizures. 4. Tracheostomy status.  Continued on trach with the vent support. 5. Chronic atrial fibrillation rate is controlled at this time   I have personally evaluated the patient, evaluated the laboratory and imaging results and formulated the assessment and plan and placed orders as needed. The Patient requires high complexity decision making for assessment and support. I have discussed the patient on rounds with the Respiratory Staff   Allyne Gee, MD [facility] Pulmonary Critical Care Medicine

## 2017-11-24 ENCOUNTER — Other Ambulatory Visit (HOSPITAL_COMMUNITY): Payer: Medicare Other | Admitting: Internal Medicine

## 2017-11-24 DIAGNOSIS — I482 Chronic atrial fibrillation, unspecified: Secondary | ICD-10-CM

## 2017-11-24 DIAGNOSIS — J181 Lobar pneumonia, unspecified organism: Secondary | ICD-10-CM

## 2017-11-24 DIAGNOSIS — Z930 Tracheostomy status: Secondary | ICD-10-CM

## 2017-11-24 DIAGNOSIS — J9621 Acute and chronic respiratory failure with hypoxia: Secondary | ICD-10-CM

## 2017-11-24 DIAGNOSIS — G40909 Epilepsy, unspecified, not intractable, without status epilepticus: Secondary | ICD-10-CM

## 2017-11-24 NOTE — Progress Notes (Signed)
[place] NOTE  PULMONARY SERVICE ROUNDS  Date of Service: 11/24/2017  Melissa Cochran  DOB: 05/21/1935  Referring physician: Deanne Coffer, MD  HPI: Melissa Cochran is a 82 y.o. female  being seen for Acute on Chronic Respiratory Failure.  She remains on the ventilator.  Was attempted on spontaneous breathing trial did not tolerate.  Currently is on pressure assist control mode.  Review of Systems: Unremarkable other than noted in HPI  Allergies:  Reviewed on the [facility]  Medications: Reviewed  Vitals: Temperature 97.4 pulse 95 respiratory rate 22 blood pressure 120/70 saturations 99%  Ventilator Settings: Mode of ventilation is pressure assist control FiO2 30% tidal volume 397 PEEP 5  Physical Exam: . General:  calm and comfortable NAD . Eyes: normal lids, irises & conjunctiva . ENT: grossly normal tongue not enlarged . Neck: no masses . Cardiovascular: S1 S2 Normal no rubs no gallop . Respiratory: No rhonchi or rales noted at this time. . Abdomen: soft non-distended . Skin: no rash seen on limited exam . Musculoskeletal:  no rigidity . Psychiatric: unable to assess . Neurologic: no involuntary movements          Lab Data and radiological Data:  Data has been reviewed   Assessment/Plan  Patient Active Problem List   Diagnosis Date Noted  . E-coli UTI, ESBL  06/30/2017  . A-fib ([place]) 06/30/2017  . Need for protective airway ventilation 06/30/2017  . Seizures ([place]) 06/26/2017  . Acute renal failure ([place])   . Elevated troponin I level   . Acute renal failure with tubular necrosis ([place])   . Palliative care encounter   . Pressure injury of skin 02/17/2017  . Bilateral lower extremity edema 10/08/2016  . Venous ulcer of ankle ([place]) 07/08/2016  . DVT (deep venous thrombosis) ([place]) 07/02/2016  . Swelling of limb 06/08/2016  . Pain in limb 06/08/2016  . Altered mental status 01/22/2016  . S/P coronary artery stent placement  10/30/2015  . Preop cardiovascular exam 10/08/2015  . SOB (shortness of breath) on exertion 10/08/2015  . 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 Mass of lower lobe of left lung, incidetnal on CT 09/2015, smoker.  09/29/2015  . Other disorders of lung 09/29/2015  . Other microscopic hematuria 08/26/2015  . Kidney stone 08/26/2015  . Urge incontinence 08/26/2015  . Pneumonia 01/24/2015  . PNA (pneumonia) 01/24/2015  . Essential (primary) hypertension 12/09/2014  . Adnexal mass 11/28/2013  . History of cardiac catheterization 11/28/2013  . Hyperlipidemia 11/28/2013  . Other specified postprocedural states 11/28/2013      1. Acute on chronic respiratory failure with hypoxia patient is failing spontaneous breathing trial continue with full support on pressure assist control.  Continue pulmonary toilet secretion management.  Patient's overall prognosis remains guarded. 2. Lobar pneumonia treated with antibiotics we will continue with present management. 3. Epilepsy no active seizures are noted. 4. Tracheostomy will continue present management 5. Chronic atrial fibrillation rate is controlled we will continue to follow   I have personally evaluated the patient, evaluated the laboratory and imaging results and formulated the assessment and plan and placed orders as needed. The Patient requires high complexity decision making for assessment and support. I have discussed the patient on rounds with the Respiratory Staff   Allyne Gee, MD [facility] Pulmonary Critical Care Medicine

## 2017-11-25 ENCOUNTER — Other Ambulatory Visit (HOSPITAL_COMMUNITY): Payer: Medicare Other | Admitting: Internal Medicine

## 2017-11-25 DIAGNOSIS — I482 Chronic atrial fibrillation, unspecified: Secondary | ICD-10-CM

## 2017-11-25 DIAGNOSIS — Z930 Tracheostomy status: Secondary | ICD-10-CM

## 2017-11-25 DIAGNOSIS — G40909 Epilepsy, unspecified, not intractable, without status epilepticus: Secondary | ICD-10-CM

## 2017-11-25 DIAGNOSIS — J9621 Acute and chronic respiratory failure with hypoxia: Secondary | ICD-10-CM

## 2017-11-25 DIAGNOSIS — J181 Lobar pneumonia, unspecified organism: Secondary | ICD-10-CM

## 2017-11-25 NOTE — Progress Notes (Signed)
[place] NOTE  PULMONARY SERVICE ROUNDS  Date of Service: 11/25/2017  Melissa Cochran  DOB: 12-Dec-1935  Referring physician: Deanne Coffer, MD  HPI: Melissa Cochran is a 82 y.o. female  being seen for Acute on Chronic Respiratory Failure.  She remains on the ventilator no distress at this time patient is on pressure assist control mode.  Comfortable without distress at this time.  Review of Systems: Unremarkable other than noted in HPI  Allergies:  Reviewed on the [facility]  Medications: Reviewed  Vitals: Temperature is 98.4 pulse 93 respiratory rate 20 blood pressure 06/02/1960 saturations 100%  Ventilator Settings: Mode of ventilation pressure assist control FiO2 30% tidal volume to 38 PEEP 5  Physical Exam: . General:  calm and comfortable NAD . Eyes: normal lids, irises & conjunctiva . ENT: grossly normal tongue not enlarged . Neck: no masses . Cardiovascular: S1 S2 Normal no rubs no gallop . Respiratory: No rhonchi at this time . Abdomen: soft non-distended . Skin: no rash seen on limited exam . Musculoskeletal:  no rigidity . Psychiatric: unable to assess . Neurologic: no involuntary movements          Lab Data and radiological Data:  Sodium 136 potassium 5.0 BUN 40 creatinine 0.4   Assessment/Plan  Patient Active Problem List   Diagnosis Date Noted  . E-coli UTI, ESBL  06/30/2017  . A-fib ([place]) 06/30/2017  . Need for protective airway ventilation 06/30/2017  . Seizures ([place]) 06/26/2017  . Acute renal failure ([place])   . Elevated troponin I level   . Acute renal failure with tubular necrosis ([place])   . Palliative care encounter   . Pressure injury of skin 02/17/2017  . Bilateral lower extremity edema 10/08/2016  . Venous ulcer of ankle ([place]) 07/08/2016  . DVT (deep venous thrombosis) ([place]) 07/02/2016  . Swelling of limb 06/08/2016  . Pain in limb 06/08/2016  . Altered mental status 01/22/2016  . S/P coronary artery  stent placement 10/30/2015  . Preop cardiovascular exam 10/08/2015  . SOB (shortness of breath) on exertion 10/08/2015  . 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 Mass of lower lobe of left lung, incidetnal on CT 09/2015, smoker.  09/29/2015  . Other disorders of lung 09/29/2015  . Other microscopic hematuria 08/26/2015  . Kidney stone 08/26/2015  . Urge incontinence 08/26/2015  . Pneumonia 01/24/2015  . PNA (pneumonia) 01/24/2015  . Essential (primary) hypertension 12/09/2014  . Adnexal mass 11/28/2013  . History of cardiac catheterization 11/28/2013  . Hyperlipidemia 11/28/2013  . Other specified postprocedural states 11/28/2013      1. Acute on chronic respiratory failure with hypoxia this is not amenable we will continue full support on the ventilator she is not wean able. 2. Lobar pneumonia treated with antibiotics we will continue with supportive care. 3. Epilepsy no active seizures 4. Tracheostomy continue present management 5. Chronic atrial fibrillation rate is controlled   I have personally evaluated the patient, evaluated the laboratory and imaging results and formulated the assessment and plan and placed orders as needed. The Patient requires high complexity decision making for assessment and support. I have discussed the patient on rounds with the Respiratory Staff   Melissa Gee, MD [facility] Pulmonary Critical Care Medicine

## 2017-11-27 ENCOUNTER — Other Ambulatory Visit (HOSPITAL_COMMUNITY): Payer: Medicare Other | Admitting: Internal Medicine

## 2017-11-27 DIAGNOSIS — G40909 Epilepsy, unspecified, not intractable, without status epilepticus: Secondary | ICD-10-CM

## 2017-11-27 DIAGNOSIS — J181 Lobar pneumonia, unspecified organism: Secondary | ICD-10-CM

## 2017-11-27 DIAGNOSIS — I482 Chronic atrial fibrillation, unspecified: Secondary | ICD-10-CM

## 2017-11-27 DIAGNOSIS — J9621 Acute and chronic respiratory failure with hypoxia: Secondary | ICD-10-CM

## 2017-11-27 NOTE — Progress Notes (Signed)
[place] NOTE  PULMONARY SERVICE ROUNDS  Date of Service: 11/27/2017  BRENDY FICEK  DOB: [birth year]  Referring physician: Deanne Coffer, MD  HPI: Melissa Cochran is a 82 y.o. female  being seen for Acute on Chronic Respiratory Failure.  She is on pressure assist control mode currently on 30% FiO2 spontaneous breathing index poor  Review of Systems: Unremarkable other than noted in HPI  Allergies:  Reviewed on the [facility]  Medications: Reviewed  Vitals: Temperature 98.2 pulse 98 respiratory rate 18 blood pressure 142/78 saturations 100%  Ventilator Settings: Mode of ventilation pressure assist control FiO2 30% tidal volume 330 PEEP 5  Physical Exam: . General:  calm and comfortable NAD . Eyes: normal lids, irises & conjunctiva . ENT: grossly normal tongue not enlarged . Neck: no masses . Cardiovascular: S1 S2 Normal no rubs no gallop . Respiratory: No rhonchi or rales . Abdomen: soft non-distended . Skin: no rash seen on limited exam . Musculoskeletal:  no rigidity . Psychiatric: unable to assess . Neurologic: no involuntary movements          Lab Data and radiological Data:  No labs to review   Assessment/Plan  Patient Active Problem List   Diagnosis Date Noted  . E-coli UTI, ESBL  06/30/2017  . A-fib ([place]) 06/30/2017  . Need for protective airway ventilation 06/30/2017  . Seizures ([place]) 06/26/2017  . Acute renal failure ([place])   . Elevated troponin I level   . Acute renal failure with tubular necrosis ([place])   . Palliative care encounter   . Pressure injury of skin 02/17/2017  . Bilateral lower extremity edema 10/08/2016  . Venous ulcer of ankle ([place]) 07/08/2016  . DVT (deep venous thrombosis) ([place]) 07/02/2016  . Swelling of limb 06/08/2016  . Pain in limb 06/08/2016  . Altered mental status 01/22/2016  . S/P coronary artery stent placement 10/30/2015  . Preop cardiovascular exam 10/08/2015  . SOB (shortness of  breath) on exertion 10/08/2015  . 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) 09/29/2015  . Mass of lower lobe of left lung, incidetnal on CT 09/2015, smoker.  09/29/2015  . Other disorders of lung 09/29/2015  . Other microscopic hematuria 08/26/2015  . Kidney stone 08/26/2015  . Urge incontinence 08/26/2015  . Pneumonia 01/24/2015  . PNA (pneumonia) 01/24/2015  . Essential (primary) hypertension 12/09/2014  . Adnexal mass 11/28/2013  . History of cardiac catheterization 11/28/2013  . Hyperlipidemia 11/28/2013  . Other specified postprocedural states 11/28/2013      1. Acute on chronic respiratory failure with hypoxia continues on full support patient spontaneous breathing index is poor not able to wean continue pulmonary toilet secretion management. 2. Lobar pneumonia treated with antibiotics we will continue to follow. 3. Seizure disorder no active seizures. 4. Chronic atrial fibrillation rate is controlled at this time we will continue to monitor.   I have personally evaluated the patient, evaluated the laboratory and imaging results and formulated the assessment and plan and placed orders as needed. The Patient requires high complexity decision making for assessment and support. I have discussed the patient on rounds with the Respiratory Staff   Allyne Gee, MD [facility] Pulmonary Critical Care Medicine

## 2017-11-29 ENCOUNTER — Other Ambulatory Visit (HOSPITAL_COMMUNITY): Payer: Medicare Other | Admitting: Internal Medicine

## 2017-11-29 DIAGNOSIS — G40909 Epilepsy, unspecified, not intractable, without status epilepticus: Secondary | ICD-10-CM

## 2017-11-29 DIAGNOSIS — I482 Chronic atrial fibrillation, unspecified: Secondary | ICD-10-CM

## 2017-11-29 DIAGNOSIS — J181 Lobar pneumonia, unspecified organism: Secondary | ICD-10-CM

## 2017-11-29 DIAGNOSIS — J9621 Acute and chronic respiratory failure with hypoxia: Secondary | ICD-10-CM

## 2017-11-29 NOTE — Progress Notes (Signed)
[place] NOTE  PULMONARY SERVICE ROUNDS  Date of Service: 11/29/2017  EKTA DANCER  DOB: 04-24-1936  Referring physician: Deanne Coffer, MD  HPI: Melissa Cochran is a 82 y.o. female  being seen for Acute on Chronic Respiratory Failure.  Patient's family was at the bedside they were updated.  Patient right now has not been doing much in the way of weaning she remains on assist control mode.  Review of Systems: Unremarkable other than noted in HPI  Allergies:  Reviewed on the [facility]  Medications: Reviewed  Vitals: Temperature 98.0 pulse 96 respiratory rate 24 blood pressure 120/80 saturations 97%  Ventilator Settings: Mode of ventilation assist control FiO2 30% tidal volume 450 PEEP 5  Physical Exam: . General:  calm and comfortable NAD . Eyes: normal lids, irises & conjunctiva . ENT: grossly normal tongue not enlarged . Neck: no masses . Cardiovascular: S1 S2 Normal no rubs no gallop . Respiratory: Coarse breath sounds no rhonchi . Abdomen: soft non-distended . Skin: no rash seen on limited exam . Musculoskeletal:  no rigidity . Psychiatric: unable to assess . Neurologic: no involuntary movements          Lab Data and radiological Data:  Data has been reviewed   Assessment/Plan  Patient Active Problem List   Diagnosis Date Noted  . E-coli UTI, ESBL  06/30/2017  . A-fib ([place]) 06/30/2017  . Need for protective airway ventilation 06/30/2017  . Seizures ([place]) 06/26/2017  . Acute renal failure ([place])   . Elevated troponin I level   . Acute renal failure with tubular necrosis ([place])   . Palliative care encounter   . Pressure injury of skin 02/17/2017  . Bilateral lower extremity edema 10/08/2016  . Venous ulcer of ankle ([place]) 07/08/2016  . DVT (deep venous thrombosis) ([place]) 07/02/2016  . Swelling of limb 06/08/2016  . Pain in limb 06/08/2016  . Altered mental status 01/22/2016  . S/P coronary artery stent placement  10/30/2015  . Preop cardiovascular exam 10/08/2015  . SOB (shortness of breath) on exertion 10/08/2015  . 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 Mass of lower lobe of left lung, incidetnal on CT 09/2015, smoker.  09/29/2015  . Other disorders of lung 09/29/2015  . Other microscopic hematuria 08/26/2015  . Kidney stone 08/26/2015  . Urge incontinence 08/26/2015  . Pneumonia 01/24/2015  . PNA (pneumonia) 01/24/2015  . Essential (primary) hypertension 12/09/2014  . Adnexal mass 11/28/2013  . History of cardiac catheterization 11/28/2013  . Hyperlipidemia 11/28/2013  . Other specified postprocedural states 11/28/2013      1. Acute on chronic respiratory failure with hypoxia continue with full vent support.  She is not amenable. 2. Lobar pneumonia treated with antibiotics we will continue present management. 3. Seizure disorder she is stable no active seizures are noted. 4. Chronic atrial fibrillation rate is controlled we will continue to monitor rhythm   I have personally evaluated the patient, evaluated the laboratory and imaging results and formulated the assessment and plan and placed orders as needed. The Patient requires high complexity decision making for assessment and support. I have discussed the patient on rounds with the Respiratory Staff   Allyne Gee, MD [facility] Pulmonary Critical Care Medicine

## 2017-11-30 ENCOUNTER — Other Ambulatory Visit (HOSPITAL_COMMUNITY): Payer: Medicare Other | Admitting: Internal Medicine

## 2017-11-30 DIAGNOSIS — J9621 Acute and chronic respiratory failure with hypoxia: Secondary | ICD-10-CM

## 2017-11-30 DIAGNOSIS — J181 Lobar pneumonia, unspecified organism: Secondary | ICD-10-CM

## 2017-11-30 DIAGNOSIS — I482 Chronic atrial fibrillation, unspecified: Secondary | ICD-10-CM

## 2017-11-30 DIAGNOSIS — R9431 Abnormal electrocardiogram [ECG] [EKG]: Secondary | ICD-10-CM

## 2017-11-30 DIAGNOSIS — G40909 Epilepsy, unspecified, not intractable, without status epilepticus: Secondary | ICD-10-CM

## 2017-11-30 NOTE — Progress Notes (Signed)
[place] NOTE  PULMONARY SERVICE ROUNDS  Date of Service: 11/30/2017  Melissa Cochran  DOB: [birth year]  Referring physician: Deanne Coffer, MD  HPI: Melissa Cochran is a 82 y.o. female  being seen for Acute on Chronic Respiratory Failure.  She is on the ventilator grossly unchanged not able to do any weaning.  Reportedly had some ST elevations noted which are being worked up by the primary care team  Review of Systems: Unremarkable other than noted in HPI  Allergies:  Reviewed on the [facility]  Medications: Reviewed  Vitals: Temperature 96.8 pulse 86 respiratory rate 20 blood pressure 110/62 saturations 100%  Ventilator Settings: Mode of ventilation pressure assist control FiO2 20% tidal volume 410 PEEP 5  Physical Exam: . General:  calm and comfortable NAD . Eyes: normal lids, irises & conjunctiva . ENT: grossly normal tongue not enlarged . Neck: no masses . Cardiovascular: S1 S2 Normal no rubs no gallop . Respiratory: Coarse breath sounds noted bilaterally . Abdomen: soft non-distended . Skin: no rash seen on limited exam . Musculoskeletal:  no rigidity . Psychiatric: unable to assess . Neurologic: no involuntary movements          Lab Data and radiological Data:  Sodium 138 potassium 4.5 BUN 46 creatinine 0.4 White count 7.3 hemoglobin 9 hematocrit 31.2 platelet count 266   Assessment/Plan  Patient Active Problem List   Diagnosis Date Noted  . E-coli UTI, ESBL  06/30/2017  . A-fib ([place]) 06/30/2017  . Need for protective airway ventilation 06/30/2017  . Seizures ([place]) 06/26/2017  . Acute renal failure ([place])   . Elevated troponin I level   . Acute renal failure with tubular necrosis ([place])   . Palliative care encounter   . Pressure injury of skin 02/17/2017  . Bilateral lower extremity edema 10/08/2016  . Venous ulcer of ankle ([place]) 07/08/2016  . DVT (deep venous thrombosis) ([place]) 07/02/2016  . Swelling of limb  06/08/2016  . Pain in limb 06/08/2016  . Altered mental status 01/22/2016  . S/P coronary artery stent placement 10/30/2015  . Preop cardiovascular exam 10/08/2015  . SOB (shortness of breath) on exertion 10/08/2015  . 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 Mass of lower lobe of left lung, incidetnal on CT 09/2015, smoker.  09/29/2015  . Other disorders of lung 09/29/2015  . Other microscopic hematuria 08/26/2015  . Kidney stone 08/26/2015  . Urge incontinence 08/26/2015  . Pneumonia 01/24/2015  . PNA (pneumonia) 01/24/2015  . Essential (primary) hypertension 12/09/2014  . Adnexal mass 11/28/2013  . History of cardiac catheterization 11/28/2013  . Hyperlipidemia 11/28/2013  . Other specified postprocedural states 11/28/2013      1. Acute on chronic respiratory failure with hypoxia patient is not able to wean she will be continued on full support we will continue with supportive care and titrate oxygen as tolerated. 2. Lobar pneumonia treated with antibiotics follow-up CT scan shows some pleural effusions which will likely need to be tapped 3. ST elevations nonspecific at this stage patient has negative troponins reportedly 4. Seizure disorder no active seizures 5. Chronic atrial fibrillation rate is controlled we will continue to follow   I have personally evaluated the patient, evaluated the laboratory and imaging results and formulated the assessment and plan and placed orders as needed. The Patient requires high complexity decision making for assessment and support. I have discussed the patient on rounds with the Respiratory Staff   Allyne Gee, MD  [facility] Pulmonary Critical Care Medicine

## 2017-12-02 ENCOUNTER — Other Ambulatory Visit (HOSPITAL_COMMUNITY): Payer: Medicare Other | Admitting: Internal Medicine

## 2017-12-02 DIAGNOSIS — J181 Lobar pneumonia, unspecified organism: Secondary | ICD-10-CM

## 2017-12-02 DIAGNOSIS — I482 Chronic atrial fibrillation, unspecified: Secondary | ICD-10-CM

## 2017-12-02 DIAGNOSIS — G40909 Epilepsy, unspecified, not intractable, without status epilepticus: Secondary | ICD-10-CM

## 2017-12-02 DIAGNOSIS — J9621 Acute and chronic respiratory failure with hypoxia: Secondary | ICD-10-CM

## 2017-12-02 DIAGNOSIS — J90 Pleural effusion, not elsewhere classified: Secondary | ICD-10-CM

## 2017-12-02 NOTE — Progress Notes (Signed)
[place] NOTE  PULMONARY SERVICE ROUNDS  Date of Service: 12/02/2017  DEALVA LAFOY  DOB: [birth year]  Referring physician: Deanne Coffer, MD  HPI: MILANIA HAUBNER is a 82 y.o. female  being seen for Acute on Chronic Respiratory Failure.  Patient remains on pressure assist control mode right now is on 20% FiO2 with a PEEP of 12  Review of Systems: Unremarkable other than noted in HPI  Allergies:  Reviewed on the [facility]  Medications: Reviewed  Vitals: Temperature 98.0 pulse 80 respiratory rate 20 blood pressure 106/60 saturations 100%  Ventilator Settings: Mode of ventilation pressure assist control FiO2 20% tidal volume 369 PEEP 5  Physical Exam: . General:  calm and comfortable NAD . Eyes: normal lids, irises & conjunctiva . ENT: grossly normal tongue not enlarged . Neck: no masses . Cardiovascular: S1 S2 Normal no rubs no gallop . Respiratory: No rhonchi or rales . Abdomen: soft non-distended . Skin: no rash seen on limited exam . Musculoskeletal:  no rigidity . Psychiatric: unable to assess . Neurologic: no involuntary movements          Lab Data and radiological Data:  Sodium 140 potassium 4.7 BUN 45 creatinine 0.3   Assessment/Plan  Patient Active Problem List   Diagnosis Date Noted  . E-coli UTI, ESBL  06/30/2017  . A-fib ([place]) 06/30/2017  . Need for protective airway ventilation 06/30/2017  . Seizures ([place]) 06/26/2017  . Acute renal failure ([place])   . Elevated troponin I level   . Acute renal failure with tubular necrosis ([place])   . Palliative care encounter   . Pressure injury of skin 02/17/2017  . Bilateral lower extremity edema 10/08/2016  . Venous ulcer of ankle ([place]) 07/08/2016  . DVT (deep venous thrombosis) ([place]) 07/02/2016  . Swelling of limb 06/08/2016  . Pain in limb 06/08/2016  . Altered mental status 01/22/2016  . S/P coronary artery stent placement 10/30/2015  . Preop cardiovascular exam  10/08/2015  . SOB (shortness of breath) on exertion 10/08/2015  . 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 Mass of lower lobe of left lung, incidetnal on CT 09/2015, smoker.  09/29/2015  . Other disorders of lung 09/29/2015  . Other microscopic hematuria 08/26/2015  . Kidney stone 08/26/2015  . Urge incontinence 08/26/2015  . Pneumonia 01/24/2015  . PNA (pneumonia) 01/24/2015  . Essential (primary) hypertension 12/09/2014  . Adnexal mass 11/28/2013  . History of cardiac catheterization 11/28/2013  . Hyperlipidemia 11/28/2013  . Other specified postprocedural states 11/28/2013      1. Acute on chronic respiratory failure with hypoxia patient is continued on full vent support mechanics are poor patient's not improving continue to follow along. 2. Pleural effusion patient is scheduled for thoracentesis reportedly 3. For pneumonia treated with antibiotics fluid would be sent for extended analysis including for empyema. 4. Seizure disorder no active seizures 5. Chronic atrial fibrillation rate is controlled   I have personally evaluated the patient, evaluated the laboratory and imaging results and formulated the assessment and plan and placed orders as needed. The Patient requires high complexity decision making for assessment and support. I have discussed the patient on rounds with the Respiratory Staff   Allyne Gee, MD [facility] Pulmonary Critical Care Medicine

## 2017-12-03 ENCOUNTER — Other Ambulatory Visit (HOSPITAL_COMMUNITY): Payer: Medicare Other | Admitting: Internal Medicine

## 2017-12-03 DIAGNOSIS — J90 Pleural effusion, not elsewhere classified: Secondary | ICD-10-CM

## 2017-12-03 DIAGNOSIS — I482 Chronic atrial fibrillation, unspecified: Secondary | ICD-10-CM

## 2017-12-03 DIAGNOSIS — G40909 Epilepsy, unspecified, not intractable, without status epilepticus: Secondary | ICD-10-CM

## 2017-12-03 DIAGNOSIS — J181 Lobar pneumonia, unspecified organism: Secondary | ICD-10-CM

## 2017-12-03 DIAGNOSIS — J9621 Acute and chronic respiratory failure with hypoxia: Secondary | ICD-10-CM

## 2017-12-03 NOTE — Progress Notes (Signed)
[place] NOTE  PULMONARY SERVICE ROUNDS  Date of Service: 12/03/2017  Melissa Cochran  DOB: May 25, 1935  Referring physician: Deanne Coffer, MD  HPI: Melissa Cochran is a 82 y.o. female  being seen for Acute on Chronic Respiratory Failure.  She is grossly unchanged remains on the ventilator apparently respiratory therapy attempted to wean her this morning she did not tolerate.  Right now is on full support on pressure assist control.  Review of Systems: Unremarkable other than noted in HPI  Allergies:  Reviewed on the [facility]  Medications: Reviewed  Vitals: Temperature 98.4 pulse 96 respiratory rate 24 blood pressure 120/70 saturations 100%  Ventilator Settings: Mode of ventilation pressure assist control FiO2 20% tidal volume 434 PEEP 5  Physical Exam: . General:  calm and comfortable NAD . Eyes: normal lids, irises & conjunctiva . ENT: grossly normal tongue not enlarged . Neck: no masses . Cardiovascular: S1 S2 Normal no rubs no gallop . Respiratory: Coarse breath sounds no rhonchi . Abdomen: soft non-distended . Skin: no rash seen on limited exam . Musculoskeletal:  no rigidity . Psychiatric: unable to assess . Neurologic: no involuntary movements          Lab Data and radiological Data:  No labs to report   Assessment/Plan  Patient Active Problem List   Diagnosis Date Noted  . E-coli UTI, ESBL  06/30/2017  . A-fib ([place]) 06/30/2017  . Need for protective airway ventilation 06/30/2017  . Seizures ([place]) 06/26/2017  . Acute renal failure ([place])   . Elevated troponin I level   . Acute renal failure with tubular necrosis ([place])   . Palliative care encounter   . Pressure injury of skin 02/17/2017  . Bilateral lower extremity edema 10/08/2016  . Venous ulcer of ankle ([place]) 07/08/2016  . DVT (deep venous thrombosis) ([place]) 07/02/2016  . Swelling of limb 06/08/2016  . Pain in limb 06/08/2016  . Altered mental status 01/22/2016   . S/P coronary artery stent placement 10/30/2015  . Preop cardiovascular exam 10/08/2015  . SOB (shortness of breath) on exertion 10/08/2015  . 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 Mass of lower lobe of left lung, incidetnal on CT 09/2015, smoker.  09/29/2015  . Other disorders of lung 09/29/2015  . Other microscopic hematuria 08/26/2015  . Kidney stone 08/26/2015  . Urge incontinence 08/26/2015  . Pneumonia 01/24/2015  . PNA (pneumonia) 01/24/2015  . Essential (primary) hypertension 12/09/2014  . Adnexal mass 11/28/2013  . History of cardiac catheterization 11/28/2013  . Hyperlipidemia 11/28/2013  . Other specified postprocedural states 11/28/2013      1. Acute on chronic respiratory failure with hypoxia we will continue with full vent support as evidenced by failure to wean this morning she is not amenable. 2. Lobar pneumonia treated with antibiotics we will continue to follow 3. Pleural effusions thoracentesis we will continue supportive care 4. Seizure disorder grossly no active seizures. 5. Chronic atrial fibrillation rate is controlled   I have personally evaluated the patient, evaluated the laboratory and imaging results and formulated the assessment and plan and placed orders as needed. The Patient requires high complexity decision making for assessment and support. I have discussed the patient on rounds with the Respiratory Staff   Allyne Gee, MD [facility] Pulmonary Critical Care Medicine

## 2017-12-04 ENCOUNTER — Other Ambulatory Visit (HOSPITAL_COMMUNITY): Payer: Medicare Other | Admitting: Internal Medicine

## 2017-12-04 DIAGNOSIS — J90 Pleural effusion, not elsewhere classified: Secondary | ICD-10-CM

## 2017-12-04 DIAGNOSIS — I482 Chronic atrial fibrillation, unspecified: Secondary | ICD-10-CM

## 2017-12-04 DIAGNOSIS — G40909 Epilepsy, unspecified, not intractable, without status epilepticus: Secondary | ICD-10-CM

## 2017-12-04 DIAGNOSIS — J181 Lobar pneumonia, unspecified organism: Secondary | ICD-10-CM

## 2017-12-04 DIAGNOSIS — J9621 Acute and chronic respiratory failure with hypoxia: Secondary | ICD-10-CM

## 2017-12-04 NOTE — Progress Notes (Signed)
[place] NOTE  PULMONARY SERVICE ROUNDS  Date of Service: 12/04/2017  Melissa Cochran  DOB: 08/14/1935  Referring physician: Deanne Coffer, MD  HPI: Melissa Cochran is a 82 y.o. female  being seen for Acute on Chronic Respiratory Failure.  She is comfortable without distress remains on pressure assist control mode has been on 20% FiO2 with a PEEP of 5  Review of Systems: Unremarkable other than noted in HPI  Allergies:  Reviewed on the [facility]  Medications: Reviewed  Vitals: Temperature 99.1 pulse 86 respiratory rate 20 blood pressure 118/60 saturations 98%  Ventilator Settings: Mode of ventilation pressure assist control FiO2 20% tidal volume 409 PEEP 5  Physical Exam: . General:  calm and comfortable NAD . Eyes: normal lids, irises & conjunctiva . ENT: grossly normal tongue not enlarged . Neck: no masses . Cardiovascular: S1 S2 Normal no rubs no gallop . Respiratory: No rhonchi or rales . Abdomen: soft non-distended . Skin: no rash seen on limited exam . Musculoskeletal:  no rigidity . Psychiatric: unable to assess . Neurologic: no involuntary movements          Lab Data and radiological Data:  Labs have been reviewed   Assessment/Plan  Patient Active Problem List   Diagnosis Date Noted  . E-coli UTI, ESBL  06/30/2017  . A-fib ([place]) 06/30/2017  . Need for protective airway ventilation 06/30/2017  . Seizures ([place]) 06/26/2017  . Acute renal failure ([place])   . Elevated troponin I level   . Acute renal failure with tubular necrosis ([place])   . Palliative care encounter   . Pressure injury of skin 02/17/2017  . Bilateral lower extremity edema 10/08/2016  . Venous ulcer of ankle ([place]) 07/08/2016  . DVT (deep venous thrombosis) ([place]) 07/02/2016  . Swelling of limb 06/08/2016  . Pain in limb 06/08/2016  . Altered mental status 01/22/2016  . S/P coronary artery stent placement 10/30/2015  . Preop cardiovascular exam  10/08/2015  . SOB (shortness of breath) on exertion 10/08/2015  . 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 Mass of lower lobe of left lung, incidetnal on CT 09/2015, smoker.  09/29/2015  . Other disorders of lung 09/29/2015  . Other microscopic hematuria 08/26/2015  . Kidney stone 08/26/2015  . Urge incontinence 08/26/2015  . Pneumonia 01/24/2015  . PNA (pneumonia) 01/24/2015  . Essential (primary) hypertension 12/09/2014  . Adnexal mass 11/28/2013  . History of cardiac catheterization 11/28/2013  . Hyperlipidemia 11/28/2013  . Other specified postprocedural states 11/28/2013      1. Acute on chronic respiratory failure with hypoxia patient continues on pressure assist control mode we will continue pulmonary toilet supportive care 2. Lobar pneumonia treated with antibiotics we will continue present management. 3. Pleural effusion status post thoracentesis 4. Seizure disorder grossly unchanged no active seizures 5. Chronic atrial fibrillation rate controlled   I have personally evaluated the patient, evaluated the laboratory and imaging results and formulated the assessment and plan and placed orders as needed. The Patient requires high complexity decision making for assessment and support. I have discussed the patient on rounds with the Respiratory Staff   Allyne Gee, MD [facility] Pulmonary Critical Care Medicine

## 2017-12-05 DIAGNOSIS — M6281 Muscle weakness (generalized): Secondary | ICD-10-CM | POA: Diagnosis not present

## 2017-12-05 DIAGNOSIS — I509 Heart failure, unspecified: Secondary | ICD-10-CM | POA: Diagnosis not present

## 2017-12-05 DIAGNOSIS — I2510 Atherosclerotic heart disease of native coronary artery without angina pectoris: Secondary | ICD-10-CM | POA: Diagnosis not present

## 2017-12-05 DIAGNOSIS — Z430 Encounter for attention to tracheostomy: Secondary | ICD-10-CM | POA: Diagnosis not present

## 2017-12-05 DIAGNOSIS — G40909 Epilepsy, unspecified, not intractable, without status epilepticus: Secondary | ICD-10-CM | POA: Diagnosis not present

## 2017-12-05 DIAGNOSIS — M6249 Contracture of muscle, multiple sites: Secondary | ICD-10-CM | POA: Diagnosis not present

## 2017-12-05 DIAGNOSIS — Z431 Encounter for attention to gastrostomy: Secondary | ICD-10-CM | POA: Diagnosis not present

## 2017-12-05 DIAGNOSIS — D320 Benign neoplasm of cerebral meninges: Secondary | ICD-10-CM | POA: Diagnosis not present

## 2017-12-05 DIAGNOSIS — L89109 Pressure ulcer of unspecified part of back, unspecified stage: Secondary | ICD-10-CM | POA: Diagnosis not present

## 2017-12-05 DIAGNOSIS — L89153 Pressure ulcer of sacral region, stage 3: Secondary | ICD-10-CM | POA: Diagnosis not present

## 2017-12-05 DIAGNOSIS — M24542 Contracture, left hand: Secondary | ICD-10-CM | POA: Diagnosis not present

## 2017-12-05 DIAGNOSIS — R4189 Other symptoms and signs involving cognitive functions and awareness: Secondary | ICD-10-CM | POA: Diagnosis not present

## 2017-12-05 DIAGNOSIS — J9620 Acute and chronic respiratory failure, unspecified whether with hypoxia or hypercapnia: Secondary | ICD-10-CM | POA: Diagnosis not present

## 2017-12-05 DIAGNOSIS — J9600 Acute respiratory failure, unspecified whether with hypoxia or hypercapnia: Secondary | ICD-10-CM | POA: Diagnosis not present

## 2017-12-05 DIAGNOSIS — K219 Gastro-esophageal reflux disease without esophagitis: Secondary | ICD-10-CM | POA: Diagnosis not present

## 2017-12-05 DIAGNOSIS — I639 Cerebral infarction, unspecified: Secondary | ICD-10-CM | POA: Diagnosis not present

## 2017-12-05 DIAGNOSIS — G40501 Epileptic seizures related to external causes, not intractable, with status epilepticus: Secondary | ICD-10-CM | POA: Diagnosis not present

## 2017-12-05 DIAGNOSIS — F445 Conversion disorder with seizures or convulsions: Secondary | ICD-10-CM | POA: Diagnosis not present

## 2017-12-05 DIAGNOSIS — J9621 Acute and chronic respiratory failure with hypoxia: Secondary | ICD-10-CM | POA: Diagnosis not present

## 2017-12-05 DIAGNOSIS — M436 Torticollis: Secondary | ICD-10-CM | POA: Diagnosis not present

## 2017-12-05 DIAGNOSIS — I480 Paroxysmal atrial fibrillation: Secondary | ICD-10-CM | POA: Diagnosis not present

## 2017-12-05 DIAGNOSIS — R509 Fever, unspecified: Secondary | ICD-10-CM | POA: Diagnosis not present

## 2017-12-05 DIAGNOSIS — G9340 Encephalopathy, unspecified: Secondary | ICD-10-CM | POA: Diagnosis not present

## 2017-12-05 DIAGNOSIS — D638 Anemia in other chronic diseases classified elsewhere: Secondary | ICD-10-CM | POA: Diagnosis not present

## 2017-12-05 DIAGNOSIS — M6250 Muscle wasting and atrophy, not elsewhere classified, unspecified site: Secondary | ICD-10-CM | POA: Diagnosis not present

## 2017-12-05 DIAGNOSIS — I82601 Acute embolism and thrombosis of unspecified veins of right upper extremity: Secondary | ICD-10-CM | POA: Diagnosis not present

## 2017-12-05 DIAGNOSIS — Z9981 Dependence on supplemental oxygen: Secondary | ICD-10-CM | POA: Diagnosis not present

## 2017-12-05 DIAGNOSIS — K117 Disturbances of salivary secretion: Secondary | ICD-10-CM | POA: Diagnosis not present

## 2017-12-05 DIAGNOSIS — Z23 Encounter for immunization: Secondary | ICD-10-CM | POA: Diagnosis not present

## 2017-12-05 DIAGNOSIS — M24541 Contracture, right hand: Secondary | ICD-10-CM | POA: Diagnosis not present

## 2017-12-05 DIAGNOSIS — J449 Chronic obstructive pulmonary disease, unspecified: Secondary | ICD-10-CM | POA: Diagnosis not present

## 2017-12-05 DIAGNOSIS — R1312 Dysphagia, oropharyngeal phase: Secondary | ICD-10-CM | POA: Diagnosis not present

## 2017-12-05 DIAGNOSIS — R1310 Dysphagia, unspecified: Secondary | ICD-10-CM | POA: Diagnosis not present

## 2017-12-05 DIAGNOSIS — I10 Essential (primary) hypertension: Secondary | ICD-10-CM | POA: Diagnosis not present

## 2017-12-05 DIAGNOSIS — R5381 Other malaise: Secondary | ICD-10-CM | POA: Diagnosis not present

## 2017-12-05 DIAGNOSIS — J189 Pneumonia, unspecified organism: Secondary | ICD-10-CM | POA: Diagnosis not present

## 2017-12-05 DIAGNOSIS — Z9911 Dependence on respirator [ventilator] status: Secondary | ICD-10-CM | POA: Diagnosis not present

## 2017-12-05 DIAGNOSIS — I5032 Chronic diastolic (congestive) heart failure: Secondary | ICD-10-CM | POA: Diagnosis not present

## 2017-12-05 DIAGNOSIS — B351 Tinea unguium: Secondary | ICD-10-CM | POA: Diagnosis not present

## 2017-12-05 DIAGNOSIS — I739 Peripheral vascular disease, unspecified: Secondary | ICD-10-CM | POA: Diagnosis not present

## 2017-12-05 DIAGNOSIS — J9622 Acute and chronic respiratory failure with hypercapnia: Secondary | ICD-10-CM | POA: Diagnosis not present

## 2017-12-05 DIAGNOSIS — G931 Anoxic brain damage, not elsewhere classified: Secondary | ICD-10-CM | POA: Diagnosis not present

## 2017-12-05 DIAGNOSIS — R2689 Other abnormalities of gait and mobility: Secondary | ICD-10-CM | POA: Diagnosis not present

## 2017-12-05 DIAGNOSIS — R0682 Tachypnea, not elsewhere classified: Secondary | ICD-10-CM | POA: Diagnosis not present

## 2017-12-05 DIAGNOSIS — J690 Pneumonitis due to inhalation of food and vomit: Secondary | ICD-10-CM | POA: Diagnosis not present

## 2017-12-06 DIAGNOSIS — J9621 Acute and chronic respiratory failure with hypoxia: Secondary | ICD-10-CM | POA: Diagnosis not present

## 2017-12-06 DIAGNOSIS — I480 Paroxysmal atrial fibrillation: Secondary | ICD-10-CM | POA: Diagnosis not present

## 2017-12-06 DIAGNOSIS — R1310 Dysphagia, unspecified: Secondary | ICD-10-CM | POA: Diagnosis not present

## 2017-12-06 DIAGNOSIS — G40909 Epilepsy, unspecified, not intractable, without status epilepticus: Secondary | ICD-10-CM | POA: Diagnosis not present

## 2017-12-07 DIAGNOSIS — R2689 Other abnormalities of gait and mobility: Secondary | ICD-10-CM | POA: Diagnosis not present

## 2017-12-07 DIAGNOSIS — M6281 Muscle weakness (generalized): Secondary | ICD-10-CM | POA: Diagnosis not present

## 2017-12-07 DIAGNOSIS — R5381 Other malaise: Secondary | ICD-10-CM | POA: Diagnosis not present

## 2017-12-07 DIAGNOSIS — R4189 Other symptoms and signs involving cognitive functions and awareness: Secondary | ICD-10-CM | POA: Diagnosis not present

## 2017-12-14 DIAGNOSIS — M6281 Muscle weakness (generalized): Secondary | ICD-10-CM | POA: Diagnosis not present

## 2017-12-14 DIAGNOSIS — R5381 Other malaise: Secondary | ICD-10-CM | POA: Diagnosis not present

## 2017-12-14 DIAGNOSIS — R4189 Other symptoms and signs involving cognitive functions and awareness: Secondary | ICD-10-CM | POA: Diagnosis not present

## 2017-12-14 DIAGNOSIS — R2689 Other abnormalities of gait and mobility: Secondary | ICD-10-CM | POA: Diagnosis not present

## 2017-12-19 DIAGNOSIS — J9600 Acute respiratory failure, unspecified whether with hypoxia or hypercapnia: Secondary | ICD-10-CM | POA: Diagnosis not present

## 2017-12-19 DIAGNOSIS — Z9911 Dependence on respirator [ventilator] status: Secondary | ICD-10-CM | POA: Diagnosis not present

## 2017-12-21 DIAGNOSIS — R5381 Other malaise: Secondary | ICD-10-CM | POA: Diagnosis not present

## 2017-12-21 DIAGNOSIS — R4189 Other symptoms and signs involving cognitive functions and awareness: Secondary | ICD-10-CM | POA: Diagnosis not present

## 2017-12-21 DIAGNOSIS — M6281 Muscle weakness (generalized): Secondary | ICD-10-CM | POA: Diagnosis not present

## 2017-12-21 DIAGNOSIS — R2689 Other abnormalities of gait and mobility: Secondary | ICD-10-CM | POA: Diagnosis not present

## 2017-12-22 DIAGNOSIS — Z9911 Dependence on respirator [ventilator] status: Secondary | ICD-10-CM | POA: Diagnosis not present

## 2017-12-22 DIAGNOSIS — J9600 Acute respiratory failure, unspecified whether with hypoxia or hypercapnia: Secondary | ICD-10-CM | POA: Diagnosis not present

## 2017-12-25 DIAGNOSIS — J9600 Acute respiratory failure, unspecified whether with hypoxia or hypercapnia: Secondary | ICD-10-CM | POA: Diagnosis not present

## 2017-12-25 DIAGNOSIS — Z9911 Dependence on respirator [ventilator] status: Secondary | ICD-10-CM | POA: Diagnosis not present

## 2017-12-29 DIAGNOSIS — Z9911 Dependence on respirator [ventilator] status: Secondary | ICD-10-CM | POA: Diagnosis not present

## 2017-12-29 DIAGNOSIS — J9600 Acute respiratory failure, unspecified whether with hypoxia or hypercapnia: Secondary | ICD-10-CM | POA: Diagnosis not present

## 2018-01-01 DIAGNOSIS — J9600 Acute respiratory failure, unspecified whether with hypoxia or hypercapnia: Secondary | ICD-10-CM | POA: Diagnosis not present

## 2018-01-01 DIAGNOSIS — Z9911 Dependence on respirator [ventilator] status: Secondary | ICD-10-CM | POA: Diagnosis not present

## 2018-01-08 DIAGNOSIS — I480 Paroxysmal atrial fibrillation: Secondary | ICD-10-CM | POA: Diagnosis not present

## 2018-01-08 DIAGNOSIS — R2689 Other abnormalities of gait and mobility: Secondary | ICD-10-CM | POA: Diagnosis not present

## 2018-01-08 DIAGNOSIS — I2510 Atherosclerotic heart disease of native coronary artery without angina pectoris: Secondary | ICD-10-CM | POA: Diagnosis not present

## 2018-01-08 DIAGNOSIS — M6281 Muscle weakness (generalized): Secondary | ICD-10-CM | POA: Diagnosis not present

## 2018-01-08 DIAGNOSIS — J9620 Acute and chronic respiratory failure, unspecified whether with hypoxia or hypercapnia: Secondary | ICD-10-CM | POA: Diagnosis not present

## 2018-01-08 DIAGNOSIS — L89109 Pressure ulcer of unspecified part of back, unspecified stage: Secondary | ICD-10-CM | POA: Diagnosis not present

## 2018-01-08 DIAGNOSIS — R4189 Other symptoms and signs involving cognitive functions and awareness: Secondary | ICD-10-CM | POA: Diagnosis not present

## 2018-01-08 DIAGNOSIS — B351 Tinea unguium: Secondary | ICD-10-CM | POA: Diagnosis not present

## 2018-01-08 DIAGNOSIS — Z9911 Dependence on respirator [ventilator] status: Secondary | ICD-10-CM | POA: Diagnosis not present

## 2018-01-08 DIAGNOSIS — R5381 Other malaise: Secondary | ICD-10-CM | POA: Diagnosis not present

## 2018-01-08 DIAGNOSIS — Z430 Encounter for attention to tracheostomy: Secondary | ICD-10-CM | POA: Diagnosis not present

## 2018-01-08 DIAGNOSIS — I82601 Acute embolism and thrombosis of unspecified veins of right upper extremity: Secondary | ICD-10-CM | POA: Diagnosis not present

## 2018-01-08 DIAGNOSIS — M436 Torticollis: Secondary | ICD-10-CM | POA: Diagnosis not present

## 2018-01-08 DIAGNOSIS — R1312 Dysphagia, oropharyngeal phase: Secondary | ICD-10-CM | POA: Diagnosis not present

## 2018-01-08 DIAGNOSIS — K117 Disturbances of salivary secretion: Secondary | ICD-10-CM | POA: Diagnosis not present

## 2018-01-08 DIAGNOSIS — Z431 Encounter for attention to gastrostomy: Secondary | ICD-10-CM | POA: Diagnosis not present

## 2018-01-08 DIAGNOSIS — J189 Pneumonia, unspecified organism: Secondary | ICD-10-CM | POA: Diagnosis not present

## 2018-01-08 DIAGNOSIS — M24542 Contracture, left hand: Secondary | ICD-10-CM | POA: Diagnosis not present

## 2018-01-08 DIAGNOSIS — J9621 Acute and chronic respiratory failure with hypoxia: Secondary | ICD-10-CM | POA: Diagnosis not present

## 2018-01-08 DIAGNOSIS — I10 Essential (primary) hypertension: Secondary | ICD-10-CM | POA: Diagnosis not present

## 2018-01-08 DIAGNOSIS — L89153 Pressure ulcer of sacral region, stage 3: Secondary | ICD-10-CM | POA: Diagnosis not present

## 2018-01-08 DIAGNOSIS — M6249 Contracture of muscle, multiple sites: Secondary | ICD-10-CM | POA: Diagnosis not present

## 2018-01-08 DIAGNOSIS — D638 Anemia in other chronic diseases classified elsewhere: Secondary | ICD-10-CM | POA: Diagnosis not present

## 2018-01-08 DIAGNOSIS — J9600 Acute respiratory failure, unspecified whether with hypoxia or hypercapnia: Secondary | ICD-10-CM | POA: Diagnosis not present

## 2018-01-08 DIAGNOSIS — I5032 Chronic diastolic (congestive) heart failure: Secondary | ICD-10-CM | POA: Diagnosis not present

## 2018-01-08 DIAGNOSIS — R0682 Tachypnea, not elsewhere classified: Secondary | ICD-10-CM | POA: Diagnosis not present

## 2018-01-08 DIAGNOSIS — F445 Conversion disorder with seizures or convulsions: Secondary | ICD-10-CM | POA: Diagnosis not present

## 2018-01-08 DIAGNOSIS — R1310 Dysphagia, unspecified: Secondary | ICD-10-CM | POA: Diagnosis not present

## 2018-01-08 DIAGNOSIS — J449 Chronic obstructive pulmonary disease, unspecified: Secondary | ICD-10-CM | POA: Diagnosis not present

## 2018-01-08 DIAGNOSIS — G40501 Epileptic seizures related to external causes, not intractable, with status epilepticus: Secondary | ICD-10-CM | POA: Diagnosis not present

## 2018-01-08 DIAGNOSIS — M24541 Contracture, right hand: Secondary | ICD-10-CM | POA: Diagnosis not present

## 2018-01-08 DIAGNOSIS — G9340 Encephalopathy, unspecified: Secondary | ICD-10-CM | POA: Diagnosis not present

## 2018-01-08 DIAGNOSIS — I639 Cerebral infarction, unspecified: Secondary | ICD-10-CM | POA: Diagnosis not present

## 2018-01-08 DIAGNOSIS — D320 Benign neoplasm of cerebral meninges: Secondary | ICD-10-CM | POA: Diagnosis not present

## 2018-01-08 DIAGNOSIS — I739 Peripheral vascular disease, unspecified: Secondary | ICD-10-CM | POA: Diagnosis not present

## 2018-01-08 DIAGNOSIS — K219 Gastro-esophageal reflux disease without esophagitis: Secondary | ICD-10-CM | POA: Diagnosis not present

## 2018-01-08 DIAGNOSIS — Z23 Encounter for immunization: Secondary | ICD-10-CM | POA: Diagnosis not present

## 2018-01-11 DIAGNOSIS — R4189 Other symptoms and signs involving cognitive functions and awareness: Secondary | ICD-10-CM | POA: Diagnosis not present

## 2018-01-11 DIAGNOSIS — Z9911 Dependence on respirator [ventilator] status: Secondary | ICD-10-CM | POA: Diagnosis not present

## 2018-01-11 DIAGNOSIS — M6281 Muscle weakness (generalized): Secondary | ICD-10-CM | POA: Diagnosis not present

## 2018-01-11 DIAGNOSIS — R5381 Other malaise: Secondary | ICD-10-CM | POA: Diagnosis not present

## 2018-01-11 DIAGNOSIS — J9600 Acute respiratory failure, unspecified whether with hypoxia or hypercapnia: Secondary | ICD-10-CM | POA: Diagnosis not present

## 2018-01-11 DIAGNOSIS — R2689 Other abnormalities of gait and mobility: Secondary | ICD-10-CM | POA: Diagnosis not present

## 2018-01-20 DIAGNOSIS — J9600 Acute respiratory failure, unspecified whether with hypoxia or hypercapnia: Secondary | ICD-10-CM | POA: Diagnosis not present

## 2018-01-20 DIAGNOSIS — Z9911 Dependence on respirator [ventilator] status: Secondary | ICD-10-CM | POA: Diagnosis not present

## 2018-01-23 DIAGNOSIS — R4189 Other symptoms and signs involving cognitive functions and awareness: Secondary | ICD-10-CM | POA: Diagnosis not present

## 2018-01-23 DIAGNOSIS — J9600 Acute respiratory failure, unspecified whether with hypoxia or hypercapnia: Secondary | ICD-10-CM | POA: Diagnosis not present

## 2018-01-23 DIAGNOSIS — R5381 Other malaise: Secondary | ICD-10-CM | POA: Diagnosis not present

## 2018-01-23 DIAGNOSIS — M6281 Muscle weakness (generalized): Secondary | ICD-10-CM | POA: Diagnosis not present

## 2018-01-23 DIAGNOSIS — Z9911 Dependence on respirator [ventilator] status: Secondary | ICD-10-CM | POA: Diagnosis not present

## 2018-01-23 DIAGNOSIS — R2689 Other abnormalities of gait and mobility: Secondary | ICD-10-CM | POA: Diagnosis not present

## 2018-01-25 DIAGNOSIS — R2689 Other abnormalities of gait and mobility: Secondary | ICD-10-CM | POA: Diagnosis not present

## 2018-01-25 DIAGNOSIS — R4189 Other symptoms and signs involving cognitive functions and awareness: Secondary | ICD-10-CM | POA: Diagnosis not present

## 2018-01-25 DIAGNOSIS — R5381 Other malaise: Secondary | ICD-10-CM | POA: Diagnosis not present

## 2018-01-25 DIAGNOSIS — M6281 Muscle weakness (generalized): Secondary | ICD-10-CM | POA: Diagnosis not present

## 2018-01-27 DIAGNOSIS — Z9911 Dependence on respirator [ventilator] status: Secondary | ICD-10-CM | POA: Diagnosis not present

## 2018-01-27 DIAGNOSIS — J9600 Acute respiratory failure, unspecified whether with hypoxia or hypercapnia: Secondary | ICD-10-CM | POA: Diagnosis not present

## 2018-01-29 IMAGING — RF DG SWALLOWING FUNCTION - NRPT [facility]
1 series · 18 of 24 positions shown · non-contrast
Comparison: none

[Series 1: run · 18 acquisitions, 18 frames shown]
[im 1/18]
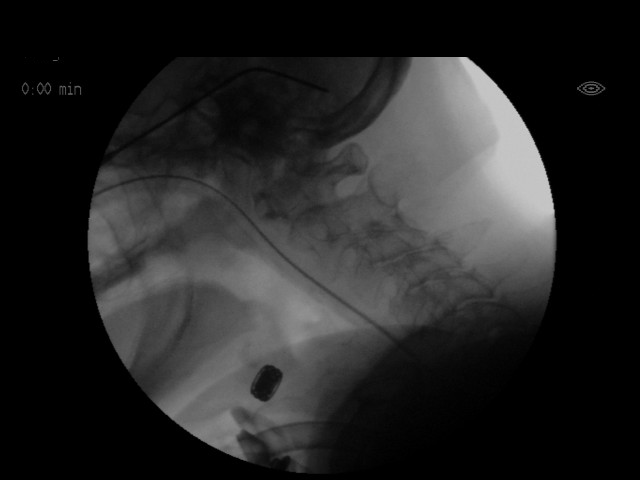
[im 2/18]
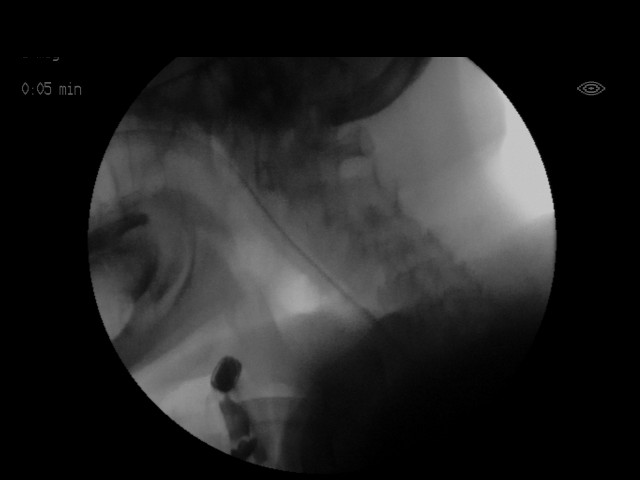
[im 3/18]
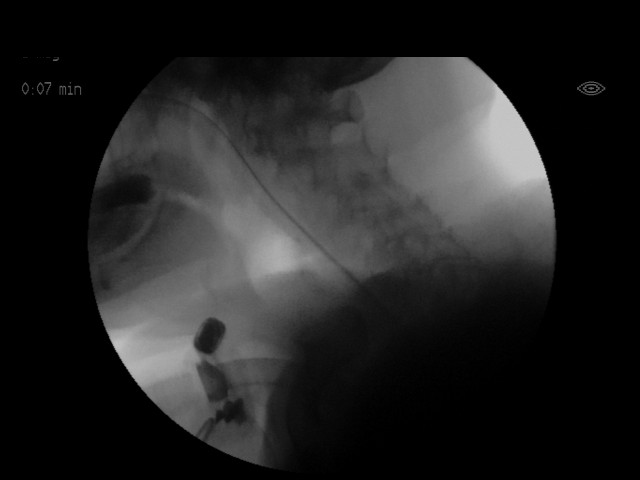
[im 4/18]
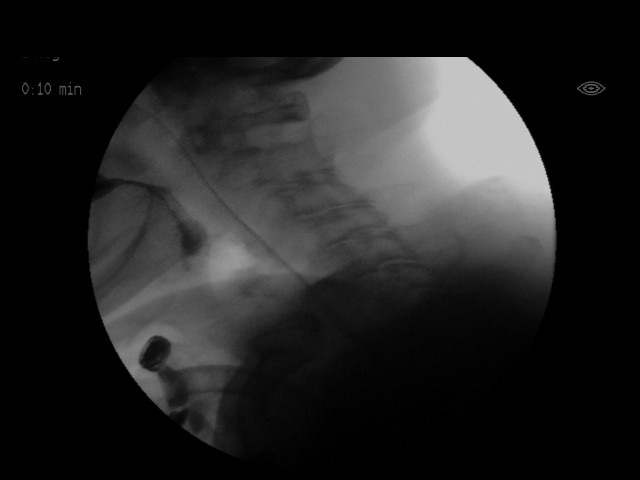
[im 5/18]
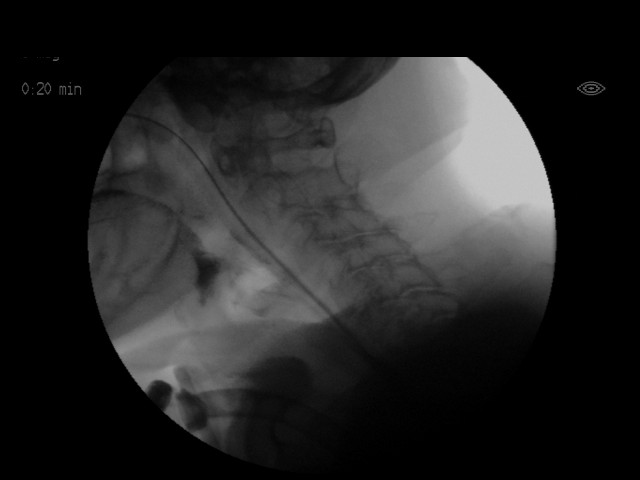
[im 6/18]
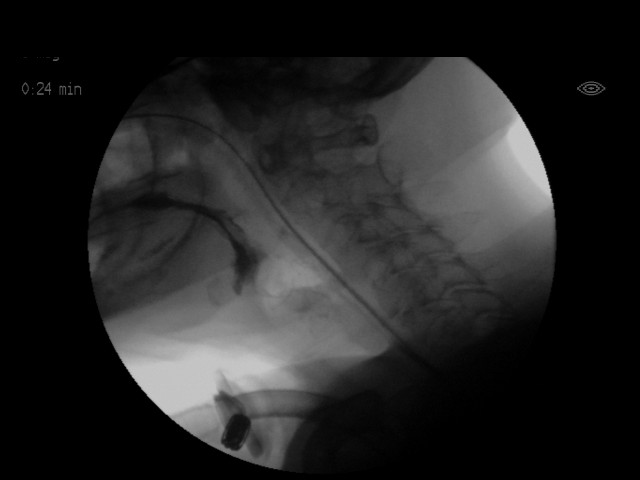
[im 7/18]
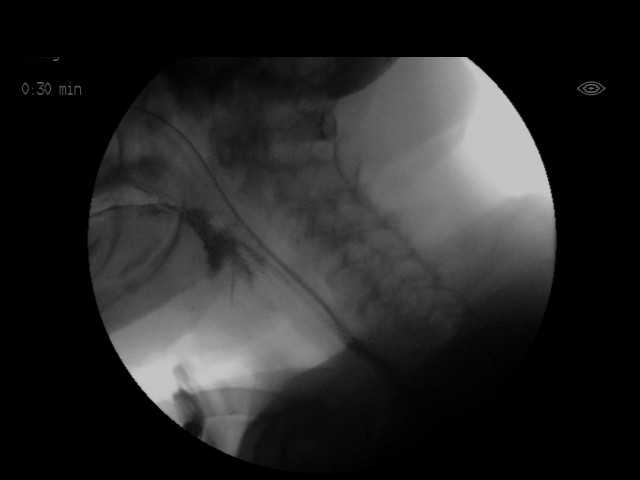
[im 8/18]
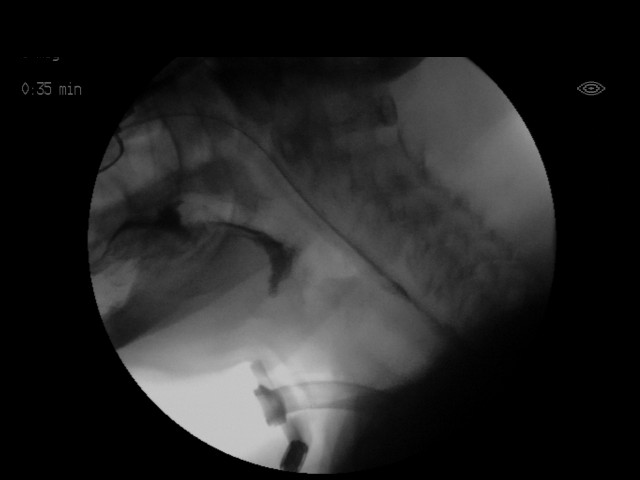
[im 9/18]
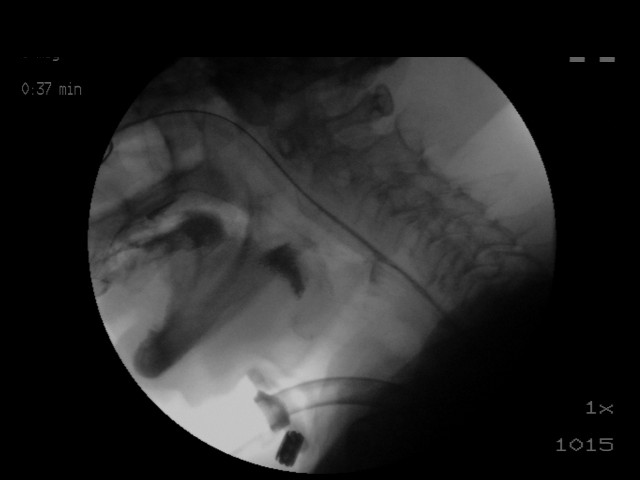
[im 10/18]
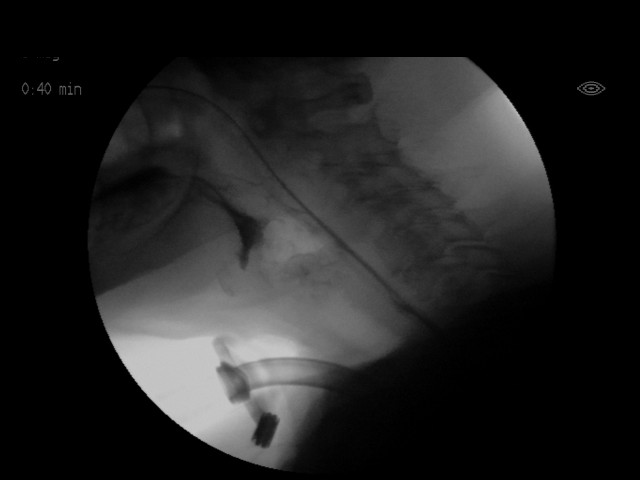
[im 11/18]
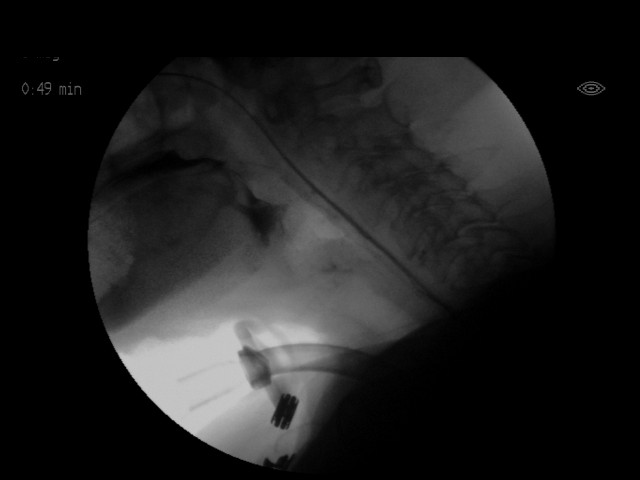
[im 12/18]
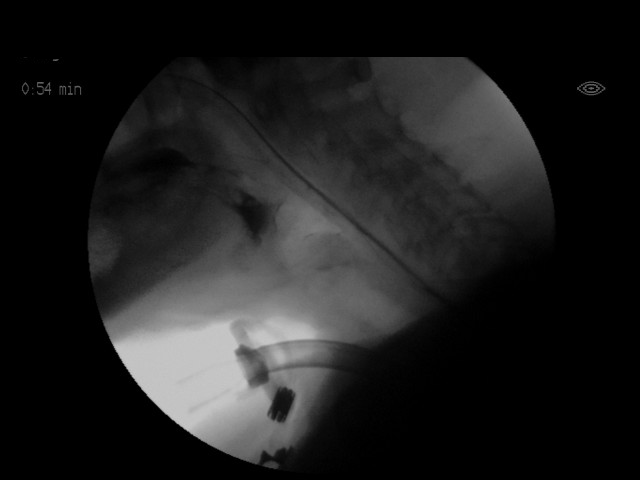
[im 13/18]
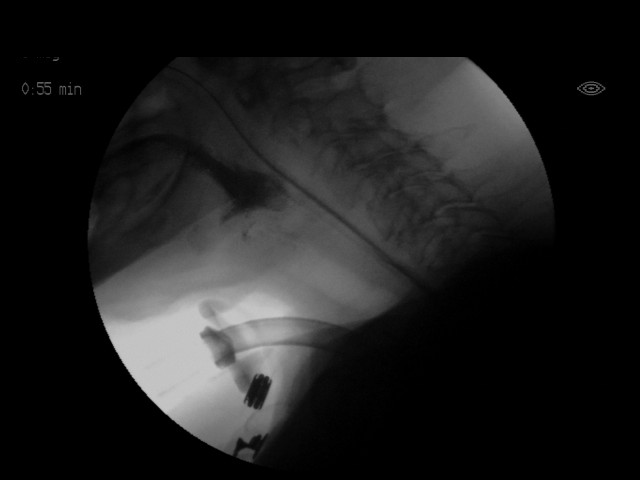
[im 15/18]
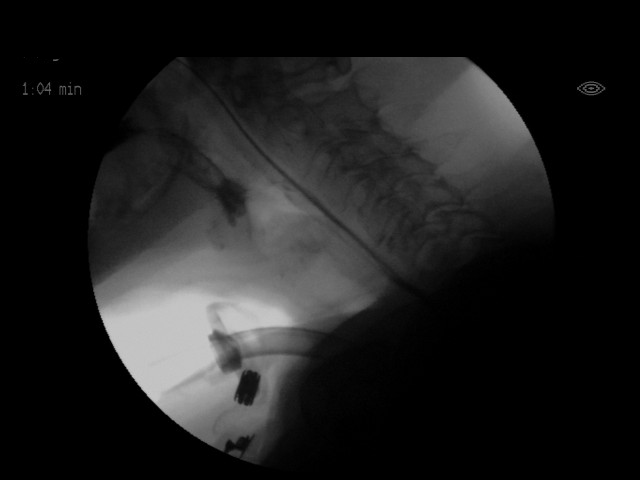
[im 15/18]
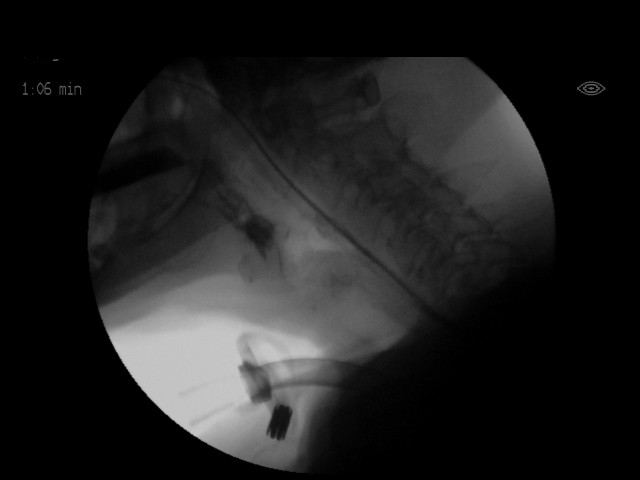
[im 16/18]
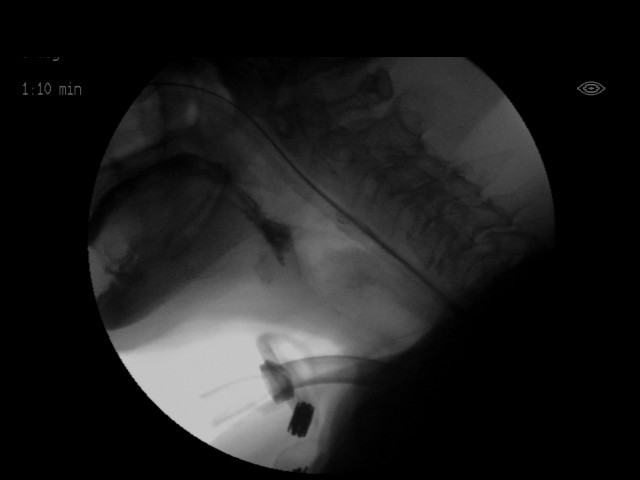
[im 18/18]
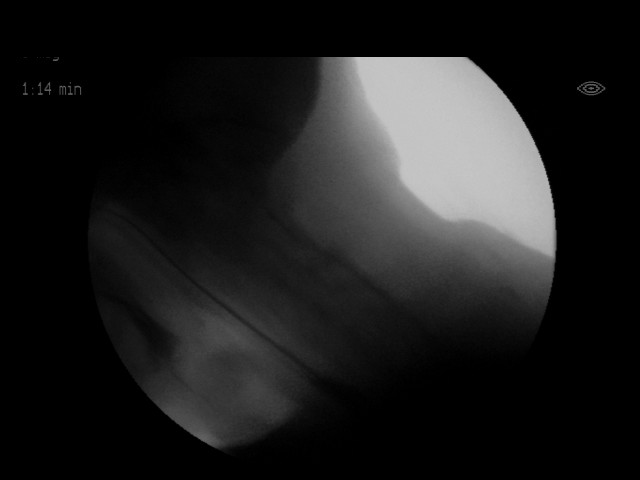
[im 18/18]
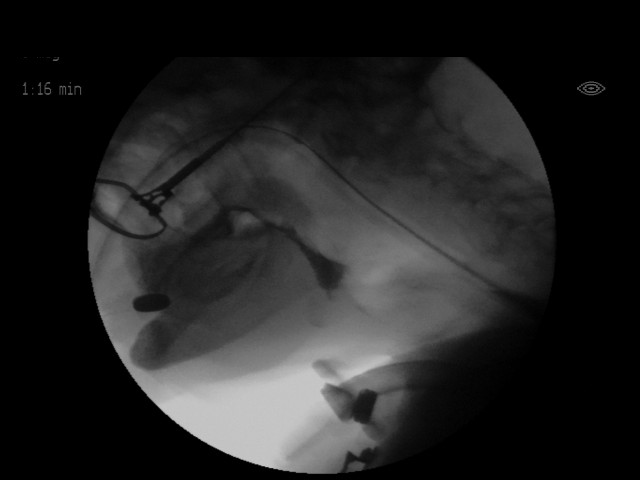

[18 of 24 positions shown; findings below may reference images not displayed]

FLUOROSCOPY FOR SWALLOWING FUNCTION STUDY:
Fluoroscopy was provided for swallowing function study, which was administered by a speech pathologist.  Final results and recommendations from this study are contained within the speech pathology report.

## 2018-02-09 DIAGNOSIS — R2689 Other abnormalities of gait and mobility: Secondary | ICD-10-CM | POA: Diagnosis not present

## 2018-02-09 DIAGNOSIS — M6281 Muscle weakness (generalized): Secondary | ICD-10-CM | POA: Diagnosis not present

## 2018-02-09 DIAGNOSIS — R4189 Other symptoms and signs involving cognitive functions and awareness: Secondary | ICD-10-CM | POA: Diagnosis not present

## 2018-02-09 DIAGNOSIS — R5381 Other malaise: Secondary | ICD-10-CM | POA: Diagnosis not present

## 2018-02-10 DIAGNOSIS — Z9911 Dependence on respirator [ventilator] status: Secondary | ICD-10-CM | POA: Diagnosis not present

## 2018-02-10 DIAGNOSIS — J9600 Acute respiratory failure, unspecified whether with hypoxia or hypercapnia: Secondary | ICD-10-CM | POA: Diagnosis not present

## 2018-02-13 DIAGNOSIS — I639 Cerebral infarction, unspecified: Secondary | ICD-10-CM

## 2018-02-13 HISTORY — DX: Cerebral infarction, unspecified: I63.9

## 2018-02-24 DIAGNOSIS — Z9911 Dependence on respirator [ventilator] status: Secondary | ICD-10-CM | POA: Diagnosis not present

## 2018-02-24 DIAGNOSIS — J9600 Acute respiratory failure, unspecified whether with hypoxia or hypercapnia: Secondary | ICD-10-CM | POA: Diagnosis not present

## 2018-02-28 DIAGNOSIS — J189 Pneumonia, unspecified organism: Secondary | ICD-10-CM | POA: Diagnosis not present

## 2018-02-28 DIAGNOSIS — J9621 Acute and chronic respiratory failure with hypoxia: Secondary | ICD-10-CM | POA: Diagnosis not present

## 2018-02-28 DIAGNOSIS — L89153 Pressure ulcer of sacral region, stage 3: Secondary | ICD-10-CM | POA: Diagnosis not present

## 2018-02-28 DIAGNOSIS — R1310 Dysphagia, unspecified: Secondary | ICD-10-CM | POA: Diagnosis not present

## 2018-03-05 DIAGNOSIS — J9600 Acute respiratory failure, unspecified whether with hypoxia or hypercapnia: Secondary | ICD-10-CM | POA: Diagnosis not present

## 2018-03-05 DIAGNOSIS — Z9911 Dependence on respirator [ventilator] status: Secondary | ICD-10-CM | POA: Diagnosis not present

## 2018-03-10 DIAGNOSIS — M6249 Contracture of muscle, multiple sites: Secondary | ICD-10-CM | POA: Diagnosis not present

## 2018-03-10 DIAGNOSIS — J9600 Acute respiratory failure, unspecified whether with hypoxia or hypercapnia: Secondary | ICD-10-CM | POA: Diagnosis not present

## 2018-03-10 DIAGNOSIS — R2689 Other abnormalities of gait and mobility: Secondary | ICD-10-CM | POA: Diagnosis not present

## 2018-03-10 DIAGNOSIS — Z9911 Dependence on respirator [ventilator] status: Secondary | ICD-10-CM | POA: Diagnosis not present

## 2018-03-10 DIAGNOSIS — R1312 Dysphagia, oropharyngeal phase: Secondary | ICD-10-CM | POA: Diagnosis not present

## 2018-03-13 DIAGNOSIS — M6249 Contracture of muscle, multiple sites: Secondary | ICD-10-CM | POA: Diagnosis not present

## 2018-03-13 DIAGNOSIS — R1312 Dysphagia, oropharyngeal phase: Secondary | ICD-10-CM | POA: Diagnosis not present

## 2018-03-13 DIAGNOSIS — R2689 Other abnormalities of gait and mobility: Secondary | ICD-10-CM | POA: Diagnosis not present

## 2018-03-14 DIAGNOSIS — R1312 Dysphagia, oropharyngeal phase: Secondary | ICD-10-CM | POA: Diagnosis not present

## 2018-03-14 DIAGNOSIS — M6249 Contracture of muscle, multiple sites: Secondary | ICD-10-CM | POA: Diagnosis not present

## 2018-03-14 DIAGNOSIS — R2689 Other abnormalities of gait and mobility: Secondary | ICD-10-CM | POA: Diagnosis not present

## 2018-03-15 DIAGNOSIS — R1312 Dysphagia, oropharyngeal phase: Secondary | ICD-10-CM | POA: Diagnosis not present

## 2018-03-15 DIAGNOSIS — R2689 Other abnormalities of gait and mobility: Secondary | ICD-10-CM | POA: Diagnosis not present

## 2018-03-15 DIAGNOSIS — M6249 Contracture of muscle, multiple sites: Secondary | ICD-10-CM | POA: Diagnosis not present

## 2018-03-16 DIAGNOSIS — M6249 Contracture of muscle, multiple sites: Secondary | ICD-10-CM | POA: Diagnosis not present

## 2018-03-16 DIAGNOSIS — R2689 Other abnormalities of gait and mobility: Secondary | ICD-10-CM | POA: Diagnosis not present

## 2018-03-16 DIAGNOSIS — R1312 Dysphagia, oropharyngeal phase: Secondary | ICD-10-CM | POA: Diagnosis not present

## 2018-03-17 DIAGNOSIS — R1312 Dysphagia, oropharyngeal phase: Secondary | ICD-10-CM | POA: Diagnosis not present

## 2018-03-17 DIAGNOSIS — M6249 Contracture of muscle, multiple sites: Secondary | ICD-10-CM | POA: Diagnosis not present

## 2018-03-17 DIAGNOSIS — R2689 Other abnormalities of gait and mobility: Secondary | ICD-10-CM | POA: Diagnosis not present

## 2018-03-20 DIAGNOSIS — R1312 Dysphagia, oropharyngeal phase: Secondary | ICD-10-CM | POA: Diagnosis not present

## 2018-03-20 DIAGNOSIS — R2689 Other abnormalities of gait and mobility: Secondary | ICD-10-CM | POA: Diagnosis not present

## 2018-03-20 DIAGNOSIS — M6249 Contracture of muscle, multiple sites: Secondary | ICD-10-CM | POA: Diagnosis not present

## 2018-03-21 DIAGNOSIS — M6249 Contracture of muscle, multiple sites: Secondary | ICD-10-CM | POA: Diagnosis not present

## 2018-03-21 DIAGNOSIS — R1312 Dysphagia, oropharyngeal phase: Secondary | ICD-10-CM | POA: Diagnosis not present

## 2018-03-21 DIAGNOSIS — R2689 Other abnormalities of gait and mobility: Secondary | ICD-10-CM | POA: Diagnosis not present

## 2018-03-22 DIAGNOSIS — R2689 Other abnormalities of gait and mobility: Secondary | ICD-10-CM | POA: Diagnosis not present

## 2018-03-22 DIAGNOSIS — R1312 Dysphagia, oropharyngeal phase: Secondary | ICD-10-CM | POA: Diagnosis not present

## 2018-03-22 DIAGNOSIS — M6249 Contracture of muscle, multiple sites: Secondary | ICD-10-CM | POA: Diagnosis not present

## 2018-03-23 DIAGNOSIS — R2689 Other abnormalities of gait and mobility: Secondary | ICD-10-CM | POA: Diagnosis not present

## 2018-03-23 DIAGNOSIS — R1312 Dysphagia, oropharyngeal phase: Secondary | ICD-10-CM | POA: Diagnosis not present

## 2018-03-23 DIAGNOSIS — M6249 Contracture of muscle, multiple sites: Secondary | ICD-10-CM | POA: Diagnosis not present

## 2018-03-24 DIAGNOSIS — J9600 Acute respiratory failure, unspecified whether with hypoxia or hypercapnia: Secondary | ICD-10-CM | POA: Diagnosis not present

## 2018-03-24 DIAGNOSIS — M6249 Contracture of muscle, multiple sites: Secondary | ICD-10-CM | POA: Diagnosis not present

## 2018-03-24 DIAGNOSIS — Z9911 Dependence on respirator [ventilator] status: Secondary | ICD-10-CM | POA: Diagnosis not present

## 2018-03-24 DIAGNOSIS — R1312 Dysphagia, oropharyngeal phase: Secondary | ICD-10-CM | POA: Diagnosis not present

## 2018-03-24 DIAGNOSIS — R2689 Other abnormalities of gait and mobility: Secondary | ICD-10-CM | POA: Diagnosis not present

## 2018-03-27 DIAGNOSIS — R2689 Other abnormalities of gait and mobility: Secondary | ICD-10-CM | POA: Diagnosis not present

## 2018-03-27 DIAGNOSIS — M6249 Contracture of muscle, multiple sites: Secondary | ICD-10-CM | POA: Diagnosis not present

## 2018-03-27 DIAGNOSIS — R1312 Dysphagia, oropharyngeal phase: Secondary | ICD-10-CM | POA: Diagnosis not present

## 2018-03-28 DIAGNOSIS — R2689 Other abnormalities of gait and mobility: Secondary | ICD-10-CM | POA: Diagnosis not present

## 2018-03-28 DIAGNOSIS — M6249 Contracture of muscle, multiple sites: Secondary | ICD-10-CM | POA: Diagnosis not present

## 2018-03-28 DIAGNOSIS — J9600 Acute respiratory failure, unspecified whether with hypoxia or hypercapnia: Secondary | ICD-10-CM | POA: Diagnosis not present

## 2018-03-28 DIAGNOSIS — R1312 Dysphagia, oropharyngeal phase: Secondary | ICD-10-CM | POA: Diagnosis not present

## 2018-03-29 DIAGNOSIS — M6249 Contracture of muscle, multiple sites: Secondary | ICD-10-CM | POA: Diagnosis not present

## 2018-03-29 DIAGNOSIS — R0989 Other specified symptoms and signs involving the circulatory and respiratory systems: Secondary | ICD-10-CM | POA: Diagnosis not present

## 2018-03-29 DIAGNOSIS — R2689 Other abnormalities of gait and mobility: Secondary | ICD-10-CM | POA: Diagnosis not present

## 2018-03-29 DIAGNOSIS — R1312 Dysphagia, oropharyngeal phase: Secondary | ICD-10-CM | POA: Diagnosis not present

## 2018-03-30 DIAGNOSIS — R2689 Other abnormalities of gait and mobility: Secondary | ICD-10-CM | POA: Diagnosis not present

## 2018-03-30 DIAGNOSIS — M6249 Contracture of muscle, multiple sites: Secondary | ICD-10-CM | POA: Diagnosis not present

## 2018-03-30 DIAGNOSIS — R1312 Dysphagia, oropharyngeal phase: Secondary | ICD-10-CM | POA: Diagnosis not present

## 2018-03-31 DIAGNOSIS — R2689 Other abnormalities of gait and mobility: Secondary | ICD-10-CM | POA: Diagnosis not present

## 2018-03-31 DIAGNOSIS — R1312 Dysphagia, oropharyngeal phase: Secondary | ICD-10-CM | POA: Diagnosis not present

## 2018-03-31 DIAGNOSIS — M6249 Contracture of muscle, multiple sites: Secondary | ICD-10-CM | POA: Diagnosis not present

## 2018-04-03 DIAGNOSIS — M6249 Contracture of muscle, multiple sites: Secondary | ICD-10-CM | POA: Diagnosis not present

## 2018-04-03 DIAGNOSIS — R1312 Dysphagia, oropharyngeal phase: Secondary | ICD-10-CM | POA: Diagnosis not present

## 2018-04-03 DIAGNOSIS — R2689 Other abnormalities of gait and mobility: Secondary | ICD-10-CM | POA: Diagnosis not present

## 2018-04-04 DIAGNOSIS — R2689 Other abnormalities of gait and mobility: Secondary | ICD-10-CM | POA: Diagnosis not present

## 2018-04-04 DIAGNOSIS — R1312 Dysphagia, oropharyngeal phase: Secondary | ICD-10-CM | POA: Diagnosis not present

## 2018-04-04 DIAGNOSIS — M6249 Contracture of muscle, multiple sites: Secondary | ICD-10-CM | POA: Diagnosis not present

## 2018-04-05 DIAGNOSIS — I480 Paroxysmal atrial fibrillation: Secondary | ICD-10-CM | POA: Diagnosis not present

## 2018-04-05 DIAGNOSIS — E785 Hyperlipidemia, unspecified: Secondary | ICD-10-CM | POA: Diagnosis not present

## 2018-04-05 DIAGNOSIS — I110 Hypertensive heart disease with heart failure: Secondary | ICD-10-CM | POA: Diagnosis not present

## 2018-04-05 DIAGNOSIS — R1312 Dysphagia, oropharyngeal phase: Secondary | ICD-10-CM | POA: Diagnosis not present

## 2018-04-05 DIAGNOSIS — Z86718 Personal history of other venous thrombosis and embolism: Secondary | ICD-10-CM | POA: Diagnosis not present

## 2018-04-05 DIAGNOSIS — J9610 Chronic respiratory failure, unspecified whether with hypoxia or hypercapnia: Secondary | ICD-10-CM | POA: Diagnosis not present

## 2018-04-05 DIAGNOSIS — R2689 Other abnormalities of gait and mobility: Secondary | ICD-10-CM | POA: Diagnosis not present

## 2018-04-05 DIAGNOSIS — Z87891 Personal history of nicotine dependence: Secondary | ICD-10-CM | POA: Diagnosis not present

## 2018-04-05 DIAGNOSIS — Z9911 Dependence on respirator [ventilator] status: Secondary | ICD-10-CM | POA: Diagnosis not present

## 2018-04-05 DIAGNOSIS — M6249 Contracture of muscle, multiple sites: Secondary | ICD-10-CM | POA: Diagnosis not present

## 2018-04-05 DIAGNOSIS — K219 Gastro-esophageal reflux disease without esophagitis: Secondary | ICD-10-CM | POA: Diagnosis not present

## 2018-04-05 DIAGNOSIS — Z430 Encounter for attention to tracheostomy: Secondary | ICD-10-CM | POA: Diagnosis not present

## 2018-04-05 DIAGNOSIS — I509 Heart failure, unspecified: Secondary | ICD-10-CM | POA: Diagnosis not present

## 2018-04-05 DIAGNOSIS — Z8673 Personal history of transient ischemic attack (TIA), and cerebral infarction without residual deficits: Secondary | ICD-10-CM | POA: Diagnosis not present

## 2018-04-06 DIAGNOSIS — R2689 Other abnormalities of gait and mobility: Secondary | ICD-10-CM | POA: Diagnosis not present

## 2018-04-06 DIAGNOSIS — R1312 Dysphagia, oropharyngeal phase: Secondary | ICD-10-CM | POA: Diagnosis not present

## 2018-04-06 DIAGNOSIS — M6249 Contracture of muscle, multiple sites: Secondary | ICD-10-CM | POA: Diagnosis not present

## 2018-04-07 DIAGNOSIS — R1312 Dysphagia, oropharyngeal phase: Secondary | ICD-10-CM | POA: Diagnosis not present

## 2018-04-07 DIAGNOSIS — R2689 Other abnormalities of gait and mobility: Secondary | ICD-10-CM | POA: Diagnosis not present

## 2018-04-07 DIAGNOSIS — J9600 Acute respiratory failure, unspecified whether with hypoxia or hypercapnia: Secondary | ICD-10-CM | POA: Diagnosis not present

## 2018-04-07 DIAGNOSIS — Z9911 Dependence on respirator [ventilator] status: Secondary | ICD-10-CM | POA: Diagnosis not present

## 2018-04-07 DIAGNOSIS — M6249 Contracture of muscle, multiple sites: Secondary | ICD-10-CM | POA: Diagnosis not present

## 2018-04-10 DIAGNOSIS — M6249 Contracture of muscle, multiple sites: Secondary | ICD-10-CM | POA: Diagnosis not present

## 2018-04-10 DIAGNOSIS — R1312 Dysphagia, oropharyngeal phase: Secondary | ICD-10-CM | POA: Diagnosis not present

## 2018-04-10 DIAGNOSIS — R2689 Other abnormalities of gait and mobility: Secondary | ICD-10-CM | POA: Diagnosis not present

## 2018-04-11 DIAGNOSIS — Z87891 Personal history of nicotine dependence: Secondary | ICD-10-CM | POA: Diagnosis not present

## 2018-04-11 DIAGNOSIS — Z430 Encounter for attention to tracheostomy: Secondary | ICD-10-CM | POA: Diagnosis not present

## 2018-04-11 DIAGNOSIS — K219 Gastro-esophageal reflux disease without esophagitis: Secondary | ICD-10-CM | POA: Diagnosis not present

## 2018-04-11 DIAGNOSIS — I480 Paroxysmal atrial fibrillation: Secondary | ICD-10-CM | POA: Diagnosis not present

## 2018-04-11 DIAGNOSIS — J181 Lobar pneumonia, unspecified organism: Secondary | ICD-10-CM | POA: Diagnosis not present

## 2018-04-11 DIAGNOSIS — E785 Hyperlipidemia, unspecified: Secondary | ICD-10-CM | POA: Diagnosis not present

## 2018-04-11 DIAGNOSIS — R2689 Other abnormalities of gait and mobility: Secondary | ICD-10-CM | POA: Diagnosis not present

## 2018-04-11 DIAGNOSIS — M6249 Contracture of muscle, multiple sites: Secondary | ICD-10-CM | POA: Diagnosis not present

## 2018-04-11 DIAGNOSIS — I110 Hypertensive heart disease with heart failure: Secondary | ICD-10-CM | POA: Diagnosis not present

## 2018-04-11 DIAGNOSIS — Z86718 Personal history of other venous thrombosis and embolism: Secondary | ICD-10-CM | POA: Diagnosis not present

## 2018-04-11 DIAGNOSIS — J9610 Chronic respiratory failure, unspecified whether with hypoxia or hypercapnia: Secondary | ICD-10-CM | POA: Diagnosis not present

## 2018-04-11 DIAGNOSIS — I509 Heart failure, unspecified: Secondary | ICD-10-CM | POA: Diagnosis not present

## 2018-04-11 DIAGNOSIS — Z9911 Dependence on respirator [ventilator] status: Secondary | ICD-10-CM | POA: Diagnosis not present

## 2018-04-11 DIAGNOSIS — Z8673 Personal history of transient ischemic attack (TIA), and cerebral infarction without residual deficits: Secondary | ICD-10-CM | POA: Diagnosis not present

## 2018-04-11 DIAGNOSIS — R1312 Dysphagia, oropharyngeal phase: Secondary | ICD-10-CM | POA: Diagnosis not present

## 2018-04-12 DIAGNOSIS — M6249 Contracture of muscle, multiple sites: Secondary | ICD-10-CM | POA: Diagnosis not present

## 2018-04-12 DIAGNOSIS — R2689 Other abnormalities of gait and mobility: Secondary | ICD-10-CM | POA: Diagnosis not present

## 2018-04-12 DIAGNOSIS — R1312 Dysphagia, oropharyngeal phase: Secondary | ICD-10-CM | POA: Diagnosis not present

## 2018-04-13 DIAGNOSIS — M6249 Contracture of muscle, multiple sites: Secondary | ICD-10-CM | POA: Diagnosis not present

## 2018-04-13 DIAGNOSIS — R2689 Other abnormalities of gait and mobility: Secondary | ICD-10-CM | POA: Diagnosis not present

## 2018-04-13 DIAGNOSIS — R1312 Dysphagia, oropharyngeal phase: Secondary | ICD-10-CM | POA: Diagnosis not present

## 2018-04-14 DIAGNOSIS — R1312 Dysphagia, oropharyngeal phase: Secondary | ICD-10-CM | POA: Diagnosis not present

## 2018-04-14 DIAGNOSIS — M6249 Contracture of muscle, multiple sites: Secondary | ICD-10-CM | POA: Diagnosis not present

## 2018-04-14 DIAGNOSIS — R2689 Other abnormalities of gait and mobility: Secondary | ICD-10-CM | POA: Diagnosis not present

## 2018-04-16 DIAGNOSIS — I509 Heart failure, unspecified: Secondary | ICD-10-CM | POA: Diagnosis not present

## 2018-04-16 DIAGNOSIS — Z9911 Dependence on respirator [ventilator] status: Secondary | ICD-10-CM | POA: Diagnosis not present

## 2018-04-16 DIAGNOSIS — J9610 Chronic respiratory failure, unspecified whether with hypoxia or hypercapnia: Secondary | ICD-10-CM | POA: Diagnosis not present

## 2018-04-16 DIAGNOSIS — R1312 Dysphagia, oropharyngeal phase: Secondary | ICD-10-CM | POA: Diagnosis not present

## 2018-04-16 DIAGNOSIS — R2689 Other abnormalities of gait and mobility: Secondary | ICD-10-CM | POA: Diagnosis not present

## 2018-04-16 DIAGNOSIS — I110 Hypertensive heart disease with heart failure: Secondary | ICD-10-CM | POA: Diagnosis not present

## 2018-04-16 DIAGNOSIS — Z87891 Personal history of nicotine dependence: Secondary | ICD-10-CM | POA: Diagnosis not present

## 2018-04-16 DIAGNOSIS — Z430 Encounter for attention to tracheostomy: Secondary | ICD-10-CM | POA: Diagnosis not present

## 2018-04-16 DIAGNOSIS — M6249 Contracture of muscle, multiple sites: Secondary | ICD-10-CM | POA: Diagnosis not present

## 2018-04-17 DIAGNOSIS — R1312 Dysphagia, oropharyngeal phase: Secondary | ICD-10-CM | POA: Diagnosis not present

## 2018-04-17 DIAGNOSIS — M6249 Contracture of muscle, multiple sites: Secondary | ICD-10-CM | POA: Diagnosis not present

## 2018-04-17 DIAGNOSIS — R2689 Other abnormalities of gait and mobility: Secondary | ICD-10-CM | POA: Diagnosis not present

## 2018-04-18 ENCOUNTER — Inpatient Hospital Stay (HOSPITAL_COMMUNITY): Payer: Medicare Other

## 2018-04-18 ENCOUNTER — Inpatient Hospital Stay (HOSPITAL_COMMUNITY)
Admission: EM | Admit: 2018-04-18 | Discharge: 2018-05-02 | DRG: 070 | Disposition: A | Discharge status: Skilled Nursing Facility | Payer: Medicare Other | Attending: Internal Medicine | Admitting: Internal Medicine

## 2018-04-18 ENCOUNTER — Encounter (HOSPITAL_COMMUNITY): Payer: Self-pay

## 2018-04-18 ENCOUNTER — Other Ambulatory Visit: Payer: Self-pay

## 2018-04-18 ENCOUNTER — Emergency Department (HOSPITAL_COMMUNITY): Payer: Medicare Other

## 2018-04-18 DIAGNOSIS — E871 Hypo-osmolality and hyponatremia: Secondary | ICD-10-CM | POA: Diagnosis not present

## 2018-04-18 DIAGNOSIS — J189 Pneumonia, unspecified organism: Secondary | ICD-10-CM

## 2018-04-18 DIAGNOSIS — E876 Hypokalemia: Secondary | ICD-10-CM | POA: Diagnosis not present

## 2018-04-18 DIAGNOSIS — L89150 Pressure ulcer of sacral region, unstageable: Secondary | ICD-10-CM | POA: Diagnosis present

## 2018-04-18 DIAGNOSIS — J9621 Acute and chronic respiratory failure with hypoxia: Secondary | ICD-10-CM | POA: Diagnosis present

## 2018-04-18 DIAGNOSIS — E870 Hyperosmolality and hypernatremia: Secondary | ICD-10-CM | POA: Diagnosis not present

## 2018-04-18 DIAGNOSIS — M1990 Unspecified osteoarthritis, unspecified site: Secondary | ICD-10-CM | POA: Diagnosis present

## 2018-04-18 DIAGNOSIS — I5022 Chronic systolic (congestive) heart failure: Secondary | ICD-10-CM | POA: Diagnosis present

## 2018-04-18 DIAGNOSIS — Z79891 Long term (current) use of opiate analgesic: Secondary | ICD-10-CM

## 2018-04-18 DIAGNOSIS — I2510 Atherosclerotic heart disease of native coronary artery without angina pectoris: Secondary | ICD-10-CM | POA: Diagnosis present

## 2018-04-18 DIAGNOSIS — M436 Torticollis: Secondary | ICD-10-CM | POA: Diagnosis present

## 2018-04-18 DIAGNOSIS — J9611 Chronic respiratory failure with hypoxia: Secondary | ICD-10-CM

## 2018-04-18 DIAGNOSIS — G9340 Encephalopathy, unspecified: Secondary | ICD-10-CM

## 2018-04-18 DIAGNOSIS — L97309 Non-pressure chronic ulcer of unspecified ankle with unspecified severity: Secondary | ICD-10-CM | POA: Diagnosis present

## 2018-04-18 DIAGNOSIS — Z9889 Other specified postprocedural states: Secondary | ICD-10-CM

## 2018-04-18 DIAGNOSIS — I482 Chronic atrial fibrillation, unspecified: Secondary | ICD-10-CM | POA: Diagnosis present

## 2018-04-18 DIAGNOSIS — I82413 Acute embolism and thrombosis of femoral vein, bilateral: Secondary | ICD-10-CM | POA: Diagnosis not present

## 2018-04-18 DIAGNOSIS — R0902 Hypoxemia: Secondary | ICD-10-CM | POA: Diagnosis not present

## 2018-04-18 DIAGNOSIS — K219 Gastro-esophageal reflux disease without esophagitis: Secondary | ICD-10-CM | POA: Diagnosis present

## 2018-04-18 DIAGNOSIS — Z87891 Personal history of nicotine dependence: Secondary | ICD-10-CM

## 2018-04-18 DIAGNOSIS — D320 Benign neoplasm of cerebral meninges: Secondary | ICD-10-CM | POA: Diagnosis present

## 2018-04-18 DIAGNOSIS — L89894 Pressure ulcer of other site, stage 4: Secondary | ICD-10-CM | POA: Diagnosis present

## 2018-04-18 DIAGNOSIS — G9389 Other specified disorders of brain: Secondary | ICD-10-CM | POA: Diagnosis not present

## 2018-04-18 DIAGNOSIS — I700 Atherosclerosis of aorta: Secondary | ICD-10-CM | POA: Diagnosis not present

## 2018-04-18 DIAGNOSIS — R2689 Other abnormalities of gait and mobility: Secondary | ICD-10-CM | POA: Diagnosis not present

## 2018-04-18 DIAGNOSIS — Z79899 Other long term (current) drug therapy: Secondary | ICD-10-CM

## 2018-04-18 DIAGNOSIS — J9610 Chronic respiratory failure, unspecified whether with hypoxia or hypercapnia: Secondary | ICD-10-CM

## 2018-04-18 DIAGNOSIS — A0472 Enterocolitis due to Clostridium difficile, not specified as recurrent: Secondary | ICD-10-CM | POA: Diagnosis present

## 2018-04-18 DIAGNOSIS — F419 Anxiety disorder, unspecified: Secondary | ICD-10-CM | POA: Diagnosis present

## 2018-04-18 DIAGNOSIS — G40909 Epilepsy, unspecified, not intractable, without status epilepticus: Secondary | ICD-10-CM

## 2018-04-18 DIAGNOSIS — R4020 Unspecified coma: Secondary | ICD-10-CM | POA: Diagnosis not present

## 2018-04-18 DIAGNOSIS — M6249 Contracture of muscle, multiple sites: Secondary | ICD-10-CM | POA: Diagnosis not present

## 2018-04-18 DIAGNOSIS — J9690 Respiratory failure, unspecified, unspecified whether with hypoxia or hypercapnia: Secondary | ICD-10-CM | POA: Diagnosis not present

## 2018-04-18 DIAGNOSIS — Z87442 Personal history of urinary calculi: Secondary | ICD-10-CM

## 2018-04-18 DIAGNOSIS — Z823 Family history of stroke: Secondary | ICD-10-CM

## 2018-04-18 DIAGNOSIS — Z515 Encounter for palliative care: Secondary | ICD-10-CM | POA: Diagnosis present

## 2018-04-18 DIAGNOSIS — J9811 Atelectasis: Secondary | ICD-10-CM | POA: Diagnosis not present

## 2018-04-18 DIAGNOSIS — R4182 Altered mental status, unspecified: Secondary | ICD-10-CM | POA: Diagnosis not present

## 2018-04-18 DIAGNOSIS — G9341 Metabolic encephalopathy: Principal | ICD-10-CM | POA: Diagnosis present

## 2018-04-18 DIAGNOSIS — I252 Old myocardial infarction: Secondary | ICD-10-CM

## 2018-04-18 DIAGNOSIS — I110 Hypertensive heart disease with heart failure: Secondary | ICD-10-CM | POA: Diagnosis present

## 2018-04-18 DIAGNOSIS — J9601 Acute respiratory failure with hypoxia: Secondary | ICD-10-CM

## 2018-04-18 DIAGNOSIS — I82409 Acute embolism and thrombosis of unspecified deep veins of unspecified lower extremity: Secondary | ICD-10-CM | POA: Diagnosis present

## 2018-04-18 DIAGNOSIS — I4891 Unspecified atrial fibrillation: Secondary | ICD-10-CM | POA: Diagnosis present

## 2018-04-18 DIAGNOSIS — Z955 Presence of coronary angioplasty implant and graft: Secondary | ICD-10-CM

## 2018-04-18 DIAGNOSIS — D649 Anemia, unspecified: Secondary | ICD-10-CM | POA: Diagnosis present

## 2018-04-18 DIAGNOSIS — Z930 Tracheostomy status: Secondary | ICD-10-CM | POA: Diagnosis not present

## 2018-04-18 DIAGNOSIS — Z7189 Other specified counseling: Secondary | ICD-10-CM | POA: Diagnosis not present

## 2018-04-18 DIAGNOSIS — N132 Hydronephrosis with renal and ureteral calculous obstruction: Secondary | ICD-10-CM | POA: Diagnosis present

## 2018-04-18 DIAGNOSIS — I639 Cerebral infarction, unspecified: Secondary | ICD-10-CM | POA: Diagnosis not present

## 2018-04-18 DIAGNOSIS — Z9911 Dependence on respirator [ventilator] status: Secondary | ICD-10-CM | POA: Diagnosis not present

## 2018-04-18 DIAGNOSIS — I83003 Varicose veins of unspecified lower extremity with ulcer of ankle: Secondary | ICD-10-CM | POA: Diagnosis present

## 2018-04-18 DIAGNOSIS — R1312 Dysphagia, oropharyngeal phase: Secondary | ICD-10-CM | POA: Diagnosis not present

## 2018-04-18 DIAGNOSIS — R569 Unspecified convulsions: Secondary | ICD-10-CM | POA: Diagnosis not present

## 2018-04-18 DIAGNOSIS — I10 Essential (primary) hypertension: Secondary | ICD-10-CM | POA: Diagnosis not present

## 2018-04-18 DIAGNOSIS — E785 Hyperlipidemia, unspecified: Secondary | ICD-10-CM | POA: Diagnosis present

## 2018-04-18 DIAGNOSIS — Z931 Gastrostomy status: Secondary | ICD-10-CM

## 2018-04-18 DIAGNOSIS — Z8673 Personal history of transient ischemic attack (TIA), and cerebral infarction without residual deficits: Secondary | ICD-10-CM

## 2018-04-18 DIAGNOSIS — Z7901 Long term (current) use of anticoagulants: Secondary | ICD-10-CM

## 2018-04-18 HISTORY — DX: Cerebral infarction, unspecified: I63.9

## 2018-04-18 HISTORY — DX: Chronic respiratory failure, unspecified whether with hypoxia or hypercapnia: J96.10

## 2018-04-18 LAB — CBC WITH DIFFERENTIAL/PLATELET
Abs Immature Granulocytes: 0.1 10*3/uL — ABNORMAL HIGH (ref 0.00–0.07)
Basophils Absolute: 0 10*3/uL (ref 0.0–0.1)
Basophils Relative: 0 %
Eosinophils Absolute: 0.2 10*3/uL (ref 0.0–0.5)
Eosinophils Relative: 2 %
HCT: 34.1 % — ABNORMAL LOW (ref 36.0–46.0)
Hemoglobin: 8.7 g/dL — ABNORMAL LOW (ref 12.0–15.0)
Immature Granulocytes: 1 %
Lymphocytes Relative: 20 %
Lymphs Abs: 1.8 10*3/uL (ref 0.7–4.0)
MCH: 26.5 pg (ref 26.0–34.0)
MCHC: 25.5 g/dL — ABNORMAL LOW (ref 30.0–36.0)
MCV: 104 fL — ABNORMAL HIGH (ref 80.0–100.0)
Monocytes Absolute: 0.3 10*3/uL (ref 0.1–1.0)
Monocytes Relative: 3 %
NRBC: 0.4 % — AB (ref 0.0–0.2)
Neutro Abs: 6.8 10*3/uL (ref 1.7–7.7)
Neutrophils Relative %: 74 %
Platelets: 186 10*3/uL (ref 150–400)
RBC: 3.28 MIL/uL — ABNORMAL LOW (ref 3.87–5.11)
RDW: 20.9 % — ABNORMAL HIGH (ref 11.5–15.5)
WBC: 9.1 10*3/uL (ref 4.0–10.5)

## 2018-04-18 LAB — BASIC METABOLIC PANEL
Anion gap: 9 (ref 5–15)
BUN: 37 mg/dL — ABNORMAL HIGH (ref 8–23)
CO2: 25 mmol/L (ref 22–32)
Calcium: 8.6 mg/dL — ABNORMAL LOW (ref 8.9–10.3)
Chloride: 120 mmol/L — ABNORMAL HIGH (ref 98–111)
Creatinine, Ser: 0.32 mg/dL — ABNORMAL LOW (ref 0.44–1.00)
GFR calc Af Amer: >60 mL/min (ref >60)
GFR calc non Af Amer: >60 mL/min (ref >60)
Glucose, Bld: 103 mg/dL — ABNORMAL HIGH (ref 70–99)
POTASSIUM: 3.9 mmol/L (ref 3.5–5.1)
Sodium: 154 mmol/L — ABNORMAL HIGH (ref 135–145)

## 2018-04-18 LAB — COMPREHENSIVE METABOLIC PANEL
ALT: 29 U/L (ref 0–44)
AST: 20 U/L (ref 15–41)
Albumin: 2.7 g/dL — ABNORMAL LOW (ref 3.5–5.0)
Alkaline Phosphatase: 53 U/L (ref 38–126)
Anion gap: 7 (ref 5–15)
BUN: 46 mg/dL — ABNORMAL HIGH (ref 8–23)
CO2: 31 mmol/L (ref 22–32)
Calcium: 9.6 mg/dL (ref 8.9–10.3)
Chloride: 113 mmol/L — ABNORMAL HIGH (ref 98–111)
Creatinine, Ser: 0.39 mg/dL — ABNORMAL LOW (ref 0.44–1.00)
GFR calc non Af Amer: >60 mL/min (ref >60)
Glucose, Bld: 111 mg/dL — ABNORMAL HIGH (ref 70–99)
Potassium: 4.3 mmol/L (ref 3.5–5.1)
Sodium: 151 mmol/L — ABNORMAL HIGH (ref 135–145)
Total Bilirubin: 0.9 mg/dL (ref 0.3–1.2)
Total Protein: 6.1 g/dL — ABNORMAL LOW (ref 6.5–8.1)

## 2018-04-18 LAB — BLOOD GAS, ARTERIAL
Acid-Base Excess: 5.7 mmol/L — ABNORMAL HIGH (ref 0.0–2.0)
Bicarbonate: 28.9 mmol/L — ABNORMAL HIGH (ref 20.0–28.0)
DRAWN BY: 441261
FIO2: 28
O2 Saturation: 96.6 %
PEEP: 5 cmH2O
Patient temperature: 98.6
RATE: 18 resp/min
VT: 430 mL
pCO2 arterial: 37.3 mmHg (ref 32.0–48.0)
pH, Arterial: 7.5 — ABNORMAL HIGH (ref 7.350–7.450)
pO2, Arterial: 78.3 mmHg — ABNORMAL LOW (ref 83.0–108.0)

## 2018-04-18 LAB — TROPONIN I
Troponin I: 0.03 ng/mL (ref <0.03)
Troponin I: <0.03 ng/mL (ref <0.03)

## 2018-04-18 LAB — URINALYSIS, ROUTINE W REFLEX MICROSCOPIC
BILIRUBIN URINE: NEGATIVE
Glucose, UA: NEGATIVE mg/dL
Hgb urine dipstick: NEGATIVE
Ketones, ur: NEGATIVE mg/dL
Leukocytes, UA: NEGATIVE
Nitrite: NEGATIVE
Protein, ur: 30 mg/dL — AB
Specific Gravity, Urine: 1.02 (ref 1.005–1.030)
pH: 6 (ref 5.0–8.0)

## 2018-04-18 LAB — TSH: TSH: 3.254 u[IU]/mL (ref 0.350–4.500)

## 2018-04-18 LAB — MAGNESIUM: Magnesium: 2.3 mg/dL (ref 1.7–2.4)

## 2018-04-18 LAB — AMMONIA: Ammonia: 25 umol/L (ref 9–35)

## 2018-04-18 LAB — VITAMIN B12: Vitamin B-12: 499 pg/mL (ref 180–914)

## 2018-04-18 LAB — POC OCCULT BLOOD, ED: Fecal Occult Bld: NEGATIVE

## 2018-04-18 LAB — I-STAT CG4 LACTIC ACID, ED: Lactic Acid, Venous: 1.46 mmol/L (ref 0.5–1.9)

## 2018-04-18 LAB — SEDIMENTATION RATE: Sed Rate: 40 mm/hr — ABNORMAL HIGH (ref 0–22)

## 2018-04-18 LAB — VALPROIC ACID LEVEL: Valproic Acid Lvl: <10 ug/mL — ABNORMAL LOW (ref 50.0–100.0)

## 2018-04-18 MED ORDER — LACTATED RINGERS IV SOLN
INTRAVENOUS | Status: DC
  Administered 2018-04-18: 20:00:00 via INTRAVENOUS

## 2018-04-18 MED ORDER — PANTOPRAZOLE SODIUM 40 MG PO PACK
40.0000 mg | PACK | Freq: Every day | ORAL | Status: DC
  Administered 2018-04-19 – 2018-05-02 (×14): 40 mg
  Filled 2018-04-18 (×16): qty 20

## 2018-04-18 MED ORDER — LEVETIRACETAM IN NACL 1000 MG/100ML IV SOLN: 1000.0000 mg | Freq: Two times a day (BID) | INTRAVENOUS | Status: DC

## 2018-04-18 MED ORDER — SODIUM CHLORIDE 0.9 % IV BOLUS (SEPSIS)
1000.0000 mL | Freq: Once | INTRAVENOUS | Status: AC
  Administered 2018-04-18: 1000 mL via INTRAVENOUS

## 2018-04-18 MED ORDER — PANTOPRAZOLE SODIUM 40 MG IV SOLR
40.0000 mg | Freq: Once | INTRAVENOUS | Status: AC
  Administered 2018-04-18: 40 mg via INTRAVENOUS
  Filled 2018-04-18: qty 40

## 2018-04-18 MED ORDER — FENTANYL CITRATE (PF) 100 MCG/2ML IJ SOLN
12.5000 ug | INTRAMUSCULAR | Status: DC | PRN
  Administered 2018-05-02 (×2): 12.5 ug via INTRAVENOUS
  Filled 2018-04-18 (×2): qty 2

## 2018-04-18 MED ORDER — DEXTROSE 5 % IV SOLN
INTRAVENOUS | Status: AC
  Administered 2018-04-18 – 2018-04-19 (×2): via INTRAVENOUS

## 2018-04-18 MED ORDER — SODIUM CHLORIDE 0.9% FLUSH
3.0000 mL | Freq: Two times a day (BID) | INTRAVENOUS | Status: DC
  Administered 2018-04-18 – 2018-05-02 (×26): 3 mL via INTRAVENOUS

## 2018-04-18 MED ORDER — VALPROATE SODIUM 500 MG/5ML IV SOLN
250.0000 mg | Freq: Two times a day (BID) | INTRAVENOUS | Status: DC
  Administered 2018-04-18 – 2018-04-23 (×10): 250 mg via INTRAVENOUS
  Filled 2018-04-18 (×12): qty 2.5

## 2018-04-18 MED ORDER — LEVETIRACETAM IN NACL 1000 MG/100ML IV SOLN
1000.0000 mg | Freq: Two times a day (BID) | INTRAVENOUS | Status: DC
  Administered 2018-04-18 – 2018-04-23 (×10): 1000 mg via INTRAVENOUS
  Filled 2018-04-18 (×10): qty 100

## 2018-04-18 MED ORDER — FREE WATER
200.0000 mL | Freq: Three times a day (TID) | Status: DC
  Administered 2018-04-19 – 2018-05-02 (×41): 200 mL

## 2018-04-18 NOTE — H&P (Addendum)
History and Physical    [name] VEL:[id number] DOB: 07/17/1935 DOA: 04/18/2018  PCP: [name], MD  Patient coming from: [facility]  I have personally briefly reviewed patient's old medical records in [place]  Chief Complaint: AMS  HPI: Melissa Cochran is Melissa Cochran 82 y.o. female with medical history significant of stroke s/p seizures, trach/peg with vent dependence, hypertension, COPD, coronary disease, heart failure, atrial fibrillation and multiple other medical problems who presents from [facility] with worsening mental status.   History is limited as patient is altered and there is limited information from the [place].  Per the ED providers note, [facility] was called due to increased lethargy and electrolyte imbalance.  She was being treated at [facility] for pneumonia with IV vancomycin and meropenem, but apparently she had been increasingly altered over the past few days and was essentially unresponsive this morning.  Per my discussion with the nurse at [facility], the Depakote level was detectable and the patient was unresponsive so there was concern that the patient may be seizing.  For that reason the patient was transferred to [facility].  On discussion with the patient's daughter and the patient's nurse, prior to her pneumonia she had been weaned off the vent and was on the speaking valve.  She was able to talk with this and was working with speech swallowing.  Her nurse notes that she has lower extremity greater than upper extremity weakness.     ED Course: Labs, CXR, EKG, IVF.  Admit for AMS.  Review of Systems: unable to assess due to mental status  Past Medical History:  Diagnosis Date  . Adnexal mass   . Anxiety   . Arthritis   . Bladder tumor   . Bleeding ulcer   . CHF (congestive heart failure) ([place])   . CHF (congestive heart failure) ([place])   . Chronic bronchitis ([place])   . COPD (chronic obstructive pulmonary disease) ([place])   . Coronary artery disease   .  DVT (deep venous thrombosis) ([facility])    RLE  . GERD (gastroesophageal reflux disease)   . Heart block    left  . History of bleeding ulcers   . History of kidney stones   . Hyperlipemia   . Hypertension   . Myocardial infarction ([place])    "slight one" (07/05/2017)  . Pneumonia    "several times" (07/05/2017)  . Seizures ([place]) 02/2017 "several"; 06/26/2017 X 1  . Shortness of breath dyspnea   . Stroke [facility]) 02/13/2018   "still right sided weakness" (07/05/2017)    Past Surgical History:  Procedure Laterality Date  . ABDOMINAL HYSTERECTOMY    . CARDIAC CATHETERIZATION Left 10/21/2015   Procedure: Left Heart Cath and Coronary Angiography;  Surgeon: Isaias Cowman, MD;  Location: [place] CV LAB;  Service: Cardiovascular;  Laterality: Left;  . CARDIAC CATHETERIZATION N/Danyale Ridinger 10/21/2015   Procedure: Coronary Stent Intervention;  Surgeon: Isaias Cowman, MD;  Location: [place] CV LAB;  Service: Cardiovascular;  Laterality: N/Stewart Pimenta;  . CARDIAC CATHETERIZATION    . CYSTOSCOPY W/ RETROGRADES Bilateral 01/20/2016   Procedure: CYSTOSCOPY WITH RETROGRADE PYELOGRAM;  Surgeon: Hollice Espy, MD;  Location: [facility] ORS;  Service: Urology;  Laterality: Bilateral;  . CYSTOSCOPY WITH STENT PLACEMENT Right 01/20/2016   Procedure: CYSTOSCOPY WITH STENT PLACEMENT;  Surgeon: Hollice Espy, MD;  Location: [facility] ORS;  Service: Urology;  Laterality: Right;  . HIP FRACTURE SURGERY Left   . STOMACH SURGERY     "stomach ulcers"  . TRACHEOSTOMY TUBE PLACEMENT  N/Neo Yepiz 03/02/2017   Procedure: TRACHEOSTOMY;  Surgeon: Rozetta Nunnery, MD;  Location: [place];  Service: ENT;  Laterality: N/Randall Colden;  . TRANSURETHRAL RESECTION OF BLADDER TUMOR N/Juanangel Soderholm 01/20/2016   Procedure: TRANSURETHRAL RESECTION OF BLADDER TUMOR (TURBT);  Surgeon: Hollice Espy, MD;  Location: [facility] ORS;  Service: Urology;  Laterality: N/Ekaterini Capitano;     reports that she quit smoking about 19 years ago. She has [name] 40.00 pack-year smoking history. She has never  used smokeless tobacco. She reports that she does not drink alcohol or use drugs.  No Known Allergies  Family History  Problem Relation Age of Onset  . Stomach cancer Mother   . CVA Father   . Bladder Cancer Neg Hx   . Kidney cancer Neg Hx    Prior to Admission medications   Medication Sig Start Date End Date Taking? Authorizing Provider  acetaminophen (TYLENOL) 325 MG tablet Take 2 tablets (650 mg total) by mouth every 6 (six) hours as needed for mild pain (or Fever >/= 101). 07/08/17  Yes Velvet Bathe, MD  apixaban (ELIQUIS) 2.5 MG TABS tablet Place 2.5 mg into feeding tube 2 (two) times daily.   Yes [provider]  aspirin 81 MG chewable tablet Chew 81 mg by mouth daily.   Yes [provider]  carvedilol (COREG) 6.25 MG tablet Take 1 tablet (6.25 mg total) by mouth 2 (two) times daily with Ahmoni Edge meal. 07/08/17  Yes Velvet Bathe, MD  chlorhexidine (PERIDEX) 0.12 % solution Use as directed 15 mLs in the mouth or throat 2 (two) times daily.   Yes [provider]  diazepam (VALIUM) 5 MG tablet Take 2.5 mg by mouth every 12 (twelve) hours.   Yes [provider]  famotidine (PEPCID) 20 MG tablet Take 20 mg by mouth at bedtime.   Yes [provider]  furosemide (LASIX) 40 MG tablet Take 1 tablet (40 mg total) by mouth daily. 07/08/17  Yes Velvet Bathe, MD  ipratropium-albuterol (DUONEB) 0.5-2.5 (3) MG/3ML SOLN Take 3 mLs by nebulization every 6 (six) hours as needed (SOB).   Yes [provider]  levETIRAcetam (KEPPRA) 100 MG/ML solution Take 10 mLs (1,000 mg total) by mouth 2 (two) times daily. 07/07/17  Yes Velvet Bathe, MD  Nutritional Supplements (ISOSOURCE 1.5 CAL) LIQD 230 mLs by Gastric Tube route every 4 (four) hours.   Yes [provider]  Valproate Sodium (DEPAKENE) 250 MG/5ML SOLN solution Take 10 mLs (500 mg total) by mouth 2 (two) times daily. Patient taking differently: Take 250 mg by mouth 2 (two) times daily.  07/08/17  Yes  Velvet Bathe, MD  meropenem 1 g in sodium chloride 0.9 % 100 mL Inject 1 g into the vein every 12 (twelve) hours. 04/11/18 04/18/18  [provider]  vancomycin 1,000 mg in sodium chloride 0.9 % 250 mL Inject 1,000 mg into the vein daily. 04/12/18 04/18/18  [provider]    Physical Exam: Vitals:   04/18/18 1810 04/18/18 1900 04/18/18 2000 04/18/18 2112  BP: (!) 105/58   134/71  Pulse: 97   92  Resp: 19   15  Temp:   (!) 97.3 F (36.3 C)   TempSrc:   Axillary   SpO2: 97%   97%  Weight: 63.5 kg     Height:  5\' 4"  (1.626 m)      Constitutional: minimally responsive Vitals:   04/18/18 1810 04/18/18 1900 04/18/18 2000 04/18/18 2112  BP: (!) 105/58   134/71  Pulse: 97  92  Resp: 19   15  Temp:   (!) 97.3 F (36.3 C)   TempSrc:   Axillary   SpO2: 97%   97%  Weight: 63.5 kg     Height:  5\' 4"  (1.626 m)     Eyes: PERRL, lids and conjunctivae normal ENMT: Mucous membranes are moist. Posterior pharynx clear of any exudate or lesions.Normal dentition.  Neck: trach in place, on vent Respiratory: clear to auscultation bilaterally, on vent Cardiovascular: irregularly irregular.  Abdomen: no tenderness, no masses palpated. No hepatosplenomegaly. Bowel sounds positive.  Musculoskeletal: no clubbing / cyanosis. No joint deformity upper and lower extremities. Good ROM, no contractures. Normal muscle tone.  Skin: decubitus ulcers to spine and sacrum Neurologic: minimally responsive, opens eyes to voice, grimaces to painful stimuli   Psychiatric: unable to assess  Labs on Admission: I have personally reviewed following labs and imaging studies  CBC: Recent Labs  Lab 04/18/18 1215  WBC 9.1  NEUTROABS 6.8  HGB 8.7*  HCT 34.1*  MCV 104.0*  PLT 355   Basic Metabolic Panel: Recent Labs  Lab 04/18/18 1215  NA 151*  K 4.3  CL 113*  CO2 31  GLUCOSE 111*  BUN 46*  CREATININE 0.39*  CALCIUM 9.6   GFR: Estimated Creatinine Clearance: 48.4 mL/min (Shar Paez) (by C-G  formula based on SCr of 0.39 mg/dL (L)). Liver Function Tests: Recent Labs  Lab 04/18/18 1215  AST 20  ALT 29  ALKPHOS 53  BILITOT 0.9  PROT 6.1*  ALBUMIN 2.7*   No results for input(s): LIPASE, AMYLASE in the last 168 hours. No results for input(s): AMMONIA in the last 168 hours. Coagulation Profile: No results for input(s): INR, PROTIME in the last 168 hours. Cardiac Enzymes: Recent Labs  Lab 04/18/18 1215  TROPONINI 0.03*   BNP (last 3 results) No results for input(s): PROBNP in the last 8760 hours. HbA1C: No results for input(s): HGBA1C in the last 72 hours. CBG: No results for input(s): GLUCAP in the last 168 hours. Lipid Profile: No results for input(s): CHOL, HDL, LDLCALC, TRIG, CHOLHDL, LDLDIRECT in the last 72 hours. Thyroid Function Tests: No results for input(s): TSH, T4TOTAL, FREET4, T3FREE, THYROIDAB in the last 72 hours. Anemia Panel: No results for input(s): VITAMINB12, FOLATE, FERRITIN, TIBC, IRON, RETICCTPCT in the last 72 hours. Urine analysis:    Component Value Date/Time   COLORURINE YELLOW 04/18/2018 1534   APPEARANCEUR CLEAR 04/18/2018 1534   APPEARANCEUR Cloudy (Solenne Manwarren) 01/29/2016 1126   LABSPEC 1.020 04/18/2018 1534   LABSPEC 1.020 01/23/2014 1047   PHURINE 6.0 04/18/2018 1534   GLUCOSEU NEGATIVE 04/18/2018 1534   GLUCOSEU NEGATIVE 01/23/2014 1047   HGBUR NEGATIVE 04/18/2018 [place] 04/18/2018 1534   BILIRUBINUR Negative 01/13/2016 1110   BILIRUBINUR NEGATIVE 01/23/2014 1047   KETONESUR NEGATIVE 04/18/2018 1534   PROTEINUR 30 (Melda Mermelstein) 04/18/2018 1534   UROBILINOGEN 0.2 08/04/2015 1140   NITRITE NEGATIVE 04/18/2018 1534   LEUKOCYTESUR NEGATIVE 04/18/2018 1534   LEUKOCYTESUR 3+ (Deshawnda Acrey) 01/13/2016 1110   LEUKOCYTESUR TRACE 01/23/2014 1047    Radiological Exams on Admission: Ct Head Wo Contrast  Result Date: 04/18/2018 CLINICAL DATA:  Encephalopathy EXAM: CT HEAD WITHOUT CONTRAST TECHNIQUE: Contiguous axial images were obtained  from the base of the skull through the vertex without intravenous contrast. COMPARISON:  CT 08/24/2017, MRI 06/28/2017 FINDINGS: Brain: Evaluation is limited by patient positioning. No acute territorial infarction, hemorrhage or new mass is visualized. 2 cm right cranial convexity meningioma, no change. Moderate-to-marked atrophy.  Small focus of encephalomalacia in the right occipital lobe. Moderate small vessel ischemic changes of the white matter. Stable ventricle size. Vascular: No hyperdense vessels.  Carotid vascular calcification. Skull: No fracture Sinuses/Orbits: Mucosal disease in the sphenoid and ethmoid sinuses. Other: None IMPRESSION: 1. No definite CT evidence for acute intracranial abnormality. 2. Atrophy and small vessel ischemic changes of the white matter. 3. Stable 2 cm right cranial convexity meningioma. Electronically Signed   By: Donavan Foil M.D.   On: 04/18/2018 18:16   Dg Chest Port 1 View  Result Date: 04/18/2018 CLINICAL DATA:  Electrolyte imbalance, increased lethargy. The patient is Esteen Delpriore tracheostomy patient and is undergoing ventilation. History of CHF and COPD. EXAM: PORTABLE CHEST 1 VIEW COMPARISON:  Portable chest x-ray of August 24, 2017 FINDINGS: The lungs are adequately inflated. There is no focal infiltrate. There is no pleural effusion or pneumothorax. The cardiac silhouette is mildly enlarged. The pulmonary vascularity is normal. The endotracheal tube tip projects approximately 3 cm above the carina. The right-sided PICC line tip projects over the midportion of the SVC. There is calcification in the wall of the aortic arch. There is calcification of the mitral valvular annulus. IMPRESSION: No acute pneumonia. Stable mild cardiomegaly without pulmonary edema. The endotracheal tube placed via the tracheostomy is in reasonable position. Thoracic aortic atherosclerosis. Electronically Signed   By: David  Martinique M.D.   On: 04/18/2018 12:18    EKG: Independently reviewed. Atrial  fibrillation.  Q waves in III, aVF appear new.    Assessment/Plan Active Problems:   Acute encephalopathy  Acute Encephalopathy:  Sent from [facility] for AMS, but unclear baseline and events leading up to transfer.  Apparently, she'd been more lethargic at [facility] and this morning was unresponsive.  I spoke to daughter and nurse at [place] [facility] who note before her pneumonia, 2 weeks ago, she was off the vent and speaking with Avon Mergenthaler speaking valve, working with speech and swallowing some as well.  Nurse at [facility] noted she has LE>UE weakness at baseline.  Since the pneumonia, it sounds like things started declining again.  Per my discussion with nurse, this morning depakote level resulted undetectable, and concern for seizures from MD so she was transferred to [facility].  Ddx includes seizures, infection, hypernatremia, etc Head CT without acute findings Follow EEG Correct hypernatremia VBG without hypercarbia.  Follow TSH, B12, folate, RPR, ammonia, utox. Valproic acid level undetectable (Carbapenems can decrease valproic acid level), follow keppra level Follow blood and urine cx (currently afebrile with normal WBC and no obvious signs of infection - though she does have decubitus ulcers to sacrum and spine).  Hold abx. Holding valium 2.5 mg q12 hours while she's altered Appreciate neurology assistance  Hypernatremia:  S/p 2 L NS bolus in ED Started on 200 cc free water per tube q8 per PCCM Follow repeat BMP and continue to monitor, adjust free water as needed  Hx Pneumonia: s/p vanc and meropenem for pneumonia, seems resolved on CXR, no further abx for now  Hypertension  HFrEF (EF 45-50%): hold lasix and coreg  Atrial fibrillation: hold eliquis for now with decrease in H/H, consider resumption when able  Abnormal EKG  Elevated troponin: new q waves in inferior leads.  Consider further w/u with echo.  Mildly elevated troponin, follow.   Chronic Respiratory Failure, vent dependent: Per PCCM  S/p G  tube:  Dietician c/s to restart tube feeds  Anemia: no si/sx bleeding, per EDP, brown, guiac negative stool, will hold eliquis for now, resume  when we know H/H stable  Decubitus Ulcers: to sacrum and spine, wound c/s.  Consider imaging.  DVT prophylaxis: SCD's Code Status: Full code, confirmed with daughter Family Communication: daughter over phone, notes full code Disposition Plan: pending improvement Consults called: PCCM, neurology Admission status: inpatient   Fayrene Helper MD [facility] Pager [phone]  If 7PM-7AM, please contact night-coverage [email or web address] Password [email or web address]  04/18/2018, 9:25 PM   35 min critical care time

## 2018-04-18 NOTE — ED Notes (Signed)
ED TO INPATIENT HANDOFF REPORT  Name/Age/Gender Melissa Cochran 82 y.o. female  Code Status Code Status History    Date Active Date Inactive Code Status Order ID Comments User Context   06/26/2017 0109 07/08/2017 1828 Full Code [id number]  Omar Person, NP Inpatient   02/22/2017 2236 03/02/2017 1607 Full Code [id number]  Richardo Priest [facility] Inpatient   02/13/2017 2109 02/22/2017 2058 Full Code [id number]  Demetrios Loll, MD Inpatient   07/08/2016 2001 07/10/2016 1730 Full Code [id number]  Demetrios Loll, MD Inpatient   01/22/2016 0157 01/23/2016 2137 Full Code [id number]  Harvie Bridge, DO Inpatient   10/21/2015 0936 10/22/2015 1511 Full Code [id number]  Isaias Cowman, MD Inpatient   01/24/2015 2130 01/28/2015 1707 Full Code [id number]  Henreitta Leber, MD Inpatient      Home/SNF/Other Skilled nursing facility  Chief Complaint vented pat  Level of Care/Admitting Diagnosis ED Disposition    ED Disposition Condition [place]: [facility] [[id number]]  Level of Care: Stepdown [14]  Admit to SDU based on following criteria: Respiratory Distress:  Frequent assessment and/or intervention to maintain adequate ventilation/respiration, pulmonary toilet, and respiratory treatment.  Diagnosis: Acute encephalopathy [749449]  Admitting Physician: Elodia Florence [phone]  Attending Physician: Cephus Slater, A CALDWELL [phone]  Estimated length of stay: past midnight tomorrow  Certification:: I certify this patient will need inpatient services for at least 2 midnights  PT Class (Do Not Modify): Inpatient [101]  PT Acc Code (Do Not Modify): Private [1]       Medical History Past Medical History:  Diagnosis Date  . Adnexal mass   . Anxiety   . Arthritis   . Bladder tumor   . Bleeding ulcer   . CHF (congestive heart failure) ([place])   . CHF (congestive heart failure) ([place])   . Chronic bronchitis ([place])   . COPD (chronic obstructive pulmonary disease)  ([place])   . Coronary artery disease   . DVT (deep venous thrombosis) (H[facility])    RLE  . GERD (gastroesophageal reflux disease)   . Heart block    left  . History of bleeding ulcers   . History of kidney stones   . Hyperlipemia   . Hypertension   . Myocardial infarction ([place])    "slight one" (07/05/2017)  . Pneumonia    "several times" (07/05/2017)  . Seizures ([place]) 02/2017 "several"; 06/26/2017 X 1  . Shortness of breath dyspnea   . Stroke [facility]) 02/13/2018   "still right sided weakness" (07/05/2017)    Allergies No Known Allergies  IV Location/Drains/Wounds Patient Lines/Drains/Airways Status   Active Line/Drains/Airways    Name:   Placement date:   Placement time:   Site:   Days:   PICC Single Lumen 04/18/18 PICC Right   04/18/18    1221    -   less than 1   NG/OG Tube Orogastric 18 Fr. Left mouth Aucultation Documented cm marking at nare/ corner of mouth 51 cm   08/24/17    1346    Left mouth   237   Urethral Catheter teresa   08/24/17    1346    -   237   Airway 7.5 mm   08/24/17    1335     237   Tracheostomy Shiley 8 mm Cuffed   04/18/18    1202    8 mm   less than 1   Wound / Incision (Open or Dehisced) 06/26/17 Non-pressure wound Leg Left;Lateral  06/26/17    0115    Leg   296          Labs/Imaging Results for orders placed or performed during the hospital encounter of 04/18/18 (from the past 48 hour(s))  Comprehensive metabolic panel     Status: Abnormal   Collection Time: 04/18/18 12:15 PM  Result Value Ref Range   Sodium 151 (H) 135 - 145 mmol/L   Potassium 4.3 3.5 - 5.1 mmol/L   Chloride 113 (H) 98 - 111 mmol/L   CO2 31 22 - 32 mmol/L   Glucose, Bld 111 (H) 70 - 99 mg/dL   BUN 46 (H) 8 - 23 mg/dL   Creatinine, Ser 0.39 (L) 0.44 - 1.00 mg/dL   Calcium 9.6 8.9 - 10.3 mg/dL   Total Protein 6.1 (L) 6.5 - 8.1 g/dL   Albumin 2.7 (L) 3.5 - 5.0 g/dL   AST 20 15 - 41 U/L   ALT 29 0 - 44 U/L   Alkaline Phosphatase 53 38 - 126 U/L   Total Bilirubin 0.9 0.3 - 1.2  mg/dL   GFR calc non Af Amer >60 >60 mL/min   GFR calc Af Amer >60 >60 mL/min   Anion gap 7 5 - 15    Comment: Performed at [facility], [place] [address] [place], [place] [address]  CBC WITH DIFFERENTIAL     Status: Abnormal   Collection Time: 04/18/18 12:15 PM  Result Value Ref Range   WBC 9.1 4.0 - 10.5 K/uL   RBC 3.28 (L) 3.87 - 5.11 MIL/uL   Hemoglobin 8.7 (L) 12.0 - 15.0 g/dL   HCT 34.1 (L) 36.0 - 46.0 %   MCV 104.0 (H) 80.0 - 100.0 fL   MCH 26.5 26.0 - 34.0 pg   MCHC 25.5 (L) 30.0 - 36.0 g/dL   RDW 20.9 (H) 11.5 - 15.5 %   Platelets 186 150 - 400 K/uL   nRBC 0.4 (H) 0.0 - 0.2 %   Neutrophils Relative % 74 %   Neutro Abs 6.8 1.7 - 7.7 K/uL   Lymphocytes Relative 20 %   Lymphs Abs 1.8 0.7 - 4.0 K/uL   Monocytes Relative 3 %   Monocytes Absolute 0.3 0.1 - 1.0 K/uL   Eosinophils Relative 2 %   Eosinophils Absolute 0.2 0.0 - 0.5 K/uL   Basophils Relative 0 %   Basophils Absolute 0.0 0.0 - 0.1 K/uL   Immature Granulocytes 1 %   Abs Immature Granulocytes 0.10 (H) 0.00 - 0.07 K/uL   Polychromasia PRESENT     Comment: Performed at [facility], [place] [address] [place], [place] [address]  Blood Culture (routine x 2)     Status: None (Preliminary result)   Collection Time: 04/18/18 12:15 PM  Result Value Ref Range   Specimen Description BLOOD PICC LINE    Special Requests      BOTTLES DRAWN AEROBIC AND ANAEROBIC Blood Culture adequate volume Performed at [facility], [place] [address], [place], [place] [address]    Culture PENDING    Report Status PENDING   Troponin I - ONCE - STAT     Status: Abnormal   Collection Time: 04/18/18 12:15 PM  Result Value Ref Range   Troponin I 0.03 (HH) <0.03 ng/mL    Comment: CRITICAL RESULT CALLED TO, READ BACK BY AND VERIFIED WITH: ZULETA,K. RN AT 1330 04/18/18 MULLINS,T Performed at [facility], [place] [address] [place], [place] [address]   Valproic  acid level     Status:  Abnormal   Collection Time: 04/18/18 12:15 PM  Result Value Ref Range   Valproic Acid Lvl <10 (L) 50.0 - 100.0 ug/mL    Comment: RESULTS CONFIRMED BY MANUAL DILUTION Performed at [place] [address], [place], [place] [address]   Blood gas, arterial ([facility], [facility], [facility])     Status: Abnormal   Collection Time: 04/18/18 12:21 PM  Result Value Ref Range   FIO2 28.00    Delivery systems VENTILATOR    Mode PRESSURE REGULATED VOLUME CONTROL    VT 430 mL   LHR 18 resp/min   Peep/cpap 5.0 cm H20   pH, Arterial 7.500 (H) 7.350 - 7.450   pCO2 arterial 37.3 32.0 - 48.0 mmHg   pO2, Arterial 78.3 (L) 83.0 - 108.0 mmHg   Bicarbonate 28.9 (H) 20.0 - 28.0 mmol/L   Acid-Base Excess 5.7 (H) 0.0 - 2.0 mmol/L   O2 Saturation 96.6 %   Patient temperature 98.6    Collection site RIGHT RADIAL    Drawn by [id number]    Sample type ARTERIAL DRAW     Comment: Performed at [facility], [place] [address] [place], [place] [address]  I-Stat CG4 Lactic Acid, ED     Status: None   Collection Time: 04/18/18 12:22 PM  Result Value Ref Range   Lactic Acid, Venous 1.46 0.5 - 1.9 mmol/L  POC occult blood, ED Provider will collect     Status: None   Collection Time: 04/18/18  1:44 PM  Result Value Ref Range   Fecal Occult Bld NEGATIVE NEGATIVE  Urinalysis, Routine w reflex microscopic     Status: Abnormal   Collection Time: 04/18/18  3:34 PM  Result Value Ref Range   Color, Urine YELLOW YELLOW   APPearance CLEAR CLEAR   Specific Gravity, Urine 1.020 1.005 - 1.030   pH 6.0 5.0 - 8.0   Glucose, UA NEGATIVE NEGATIVE mg/dL   Hgb urine dipstick NEGATIVE NEGATIVE   Bilirubin Urine NEGATIVE NEGATIVE   Ketones, ur NEGATIVE NEGATIVE mg/dL   Protein, ur 30 (A) NEGATIVE mg/dL   Nitrite NEGATIVE NEGATIVE   Leukocytes, UA NEGATIVE NEGATIVE   RBC / HPF 0-5 0 - 5 RBC/hpf   WBC, UA 0-5 0 - 5 WBC/hpf   Bacteria, UA RARE (A) NONE SEEN    Comment: Performed at [facility], [place] [address] [place],  [address]   Dg Chest Port 1 View  Result Date: 04/18/2018 CLINICAL DATA:  Electrolyte imbalance, increased lethargy. The patient is a tracheostomy patient and is undergoing ventilation. History of CHF and COPD. EXAM: PORTABLE CHEST 1 VIEW COMPARISON:  Portable chest x-ray of August 24, 2017 FINDINGS: The lungs are adequately inflated. There is no focal infiltrate. There is no pleural effusion or pneumothorax. The cardiac silhouette is mildly enlarged. The pulmonary vascularity is normal. The endotracheal tube tip projects approximately 3 cm above the carina. The right-sided PICC line tip projects over the midportion of the SVC. There is calcification in the wall of the aortic arch. There is calcification of the mitral valvular annulus. IMPRESSION: No acute pneumonia. Stable mild cardiomegaly without pulmonary edema. The endotracheal tube placed via the tracheostomy is in reasonable position. Thoracic aortic atherosclerosis. Electronically Signed   By: David  Martinique M.D.   On: 04/18/2018 12:18   EKG Interpretation  Date/Time:  Tuesday April 18 2018 12:14:04 EST Ventricular Rate:  101 PR Interval:    QRS  Duration: 79 QT Interval:  356 QTC Calculation: 462 R Axis:   -30 Text Interpretation:  Atrial fibrillation Inferior infarct, old No significant change since last tracing Confirmed by Isla Pence [phone]) on 04/18/2018 3:38:22 PM   Pending Labs Unresulted Labs (From admission, onward)    Start     Ordered   04/19/18 0500  Procalcitonin  Daily,   R     04/18/18 1550   04/18/18 1732  Levetiracetam level  Add-on,   R     04/18/18 1731   04/18/18 1551  Procalcitonin - Baseline  Add-on,   STAT     04/18/18 1550   04/18/18 1550  Ammonia  Add-on,   R     04/18/18 1549   04/18/18 1550  Vitamin B12  Add-on,   R     04/18/18 1549   04/18/18 1550  Folate  Add-on,   R     04/18/18 1549   04/18/18 1550  RPR  Add-on,   R     04/18/18 1549    04/18/18 1203  Urine culture  ONCE - STAT,   STAT     04/18/18 1203   04/18/18 1203  Culture, respiratory (non-expectorated)  Once,   R     04/18/18 1203   04/18/18 1202  Blood Culture (routine x 2)  BLOOD CULTURE X 2,   STAT     04/18/18 1203   Signed and Held  TSH  Add-on,   R     Signed and Held   Signed and Held  Comprehensive metabolic panel  Tomorrow morning,   R     Signed and Held   Signed and Held  CBC  Tomorrow morning,   R     Signed and Held          Vitals/Pain Today's Vitals   04/18/18 1625 04/18/18 1700 04/18/18 1739 04/18/18 1743  BP: 118/67 112/62 102/66   Pulse: 94 94 93   Resp: 18 18 (!) 23   Temp:    99.1 F (37.3 C)  TempSrc:    Rectal  SpO2: 97% 97% 100%   Height:        Isolation Precautions No active isolations  Medications Medications  pantoprazole sodium (PROTONIX) 40 mg/20 mL oral suspension 40 mg (has no administration in time range)  fentaNYL (SUBLIMAZE) injection 12.5-25 mcg (has no administration in time range)  free water 200 mL (has no administration in time range)  levETIRAcetam (KEPPRA) IVPB 1000 mg/100 mL premix (has no administration in time range)  valproate (DEPACON) 250 mg in dextrose 5 % 50 mL IVPB (has no administration in time range)  sodium chloride 0.9 % bolus 1,000 mL (0 mLs Intravenous Stopped 04/18/18 1310)    And  sodium chloride 0.9 % bolus 1,000 mL (0 mLs Intravenous Stopped 04/18/18 1432)  pantoprazole (PROTONIX) injection 40 mg (40 mg Intravenous Given 04/18/18 1541)    Mobility non-ambulatory

## 2018-04-18 NOTE — ED Notes (Signed)
Bed: [facility] Expected date:  Expected time:  Means of arrival:  Comments: EMS-SOB

## 2018-04-18 NOTE — Consult Note (Signed)
NAME:  Melissa Cochran, MRN:  [id number], DOB:  1935-12-03, LOS: 0 ADMISSION DATE:  04/18/2018, CONSULTATION DATE:  04/18/18 REFERRING MD:  Dr. Gilford Raid, CHIEF COMPLAINT:  Altered mental status   Brief History   82 y/o F, [facility] resident, who presented to [facility] on 12/10 with reports of AMS.    History of present illness   82 y/o F who presented to [facility] on 12/10 from [facility] with reports of altered mental status.    [facility] was called for transport for lethargy and electrolyte imbalances but apparently there was no paper work sent with the patient.  Per report, at baseline she is awake and will make eye contact.  Hx of CVA with post stroke seizures, Trach/ventilator, PEG dependent at baseline.  At [place], she was being treated for pneumonia with IV vancomycin and meropenem.  She is supposed to be on depakote, keppra for seizures but per EMS report depakote level was low.  No reported obvious seizure activity.    Initial labs - Na 151, K 4.3, CL 113, BUN 46, Sr Cr 0.39, WBC 9.1, Hgb 8.7 and platelets 186.  PCXR was evaluated and negative for infiltrate, trach in good position, ? Tiny left effusion.  ABG 7.5 / 37 / 78 / 28.   Daughter reported patient was apparently using PMV with SLP approximately 2 weeks ago.  She was (per her report) off the vent but has declined since being treated for PNA.   Past Medical History  AF - not on blood thinners due to bleeding ulcers Hx DVT CVA with subsequent seizures Trach / Vent Dependent  PEG Dependent  GERD HTN HLD CAD s/p MI  CHF  PNA    Significant Hospital Events   12/10 Admit   Consults:  12/10 PCCM   Procedures:     Significant Diagnostic Tests:  EEG 12/10 >>  CT Head 12/10 >>  Micro Data:  Tracheal Aspirate 12/10 >>  Blood Cultures 10/10 >>  UC 12/10 >>  Antimicrobials:    Interim history/subjective:  As above.   Objective   Blood pressure 109/78, pulse 86, resp. rate 17, height 5\' 4"  (1.626 m), SpO2 99 %.      Vent Mode: PRVC FiO2 (%):  [28 %] 28 % Set Rate:  [12 bmp-18 bmp] 12 bmp Vt Set:  [430 mL] 430 mL PEEP:  [5 cmH20] 5 cmH20 Plateau Pressure:  [17 cmH20] 17 cmH20   Intake/Output Summary (Last 24 hours) at 04/18/2018 1528 Last data filed at 04/18/2018 1432 Gross per 24 hour  Intake 2000 ml  Output -  Net 2000 ml   There were no vitals filed for this visit.  Examination: General: chronically ill appearing female lying in bed on vent HEENT: MM pink/moist, ? torticollis / head turned to right / difficult to get back to midline, pupils 18mm Neuro: patient raises eyebrows when name called, ?eye contact, does not follow simple commands but grips hand when touched CV: s1s2 rrr, no m/r/g PULM: even/non-labored, lungs bilaterally clear  HY:IFOY, non-tender, bsx4 active  Extremities: warm/dry, no edema, bilateral foot drop   Skin: no rashes or lesions  Resolved Hospital Problem list     Assessment & Plan:   Chronic Ventilator Dependent Respiratory Failure  -#8 Shiley cuffed trach -recent Rx for PNA with vanco + meropenem P: PRVC 8cc/kg  Minimal PEEP/FiO2 need, 5/30% Follow intermittent CXR > clear on admit  Trach care per protocol  PRN low dose fentanyl for pain to allow for neuro  exam / pt in no distress   Acute Encephalopathy  -unclear baseline  -likely metabolic with hypernatremia, r/o seizure -hx of seizures on AED's, recent meropenem administration / ?lowered seizure threshold P: Per primary MD EEG, Ammonia, RPR, TSH, B12 Consider assessment of AED's for therapeutic level CT head  Pan culture   Hypernatremia  P: Free water 200 ml PT Q8  At Risk Malnutrition  P: Consider TF in am 12/10  Hypertension  P: Per primary    Best practice:  Diet: NPO    Pain/Anxiety/Delirium protocol (if indicated): PRN low dose fentanyl to allow for neuro exam VAP protocol (if indicated): in place DVT prophylaxis: SCD's GI prophylaxis: Protonix PT QD Glucose control:   Mobility: bed rest  Code Status: Full Code  Family Communication: Daughter updated per Dr. Florene Glen  Disposition: SDU  Labs   CBC: Recent Labs  Lab 04/18/18 1215  WBC 9.1  NEUTROABS 6.8  HGB 8.7*  HCT 34.1*  MCV 104.0*  PLT 102    Basic Metabolic Panel: Recent Labs  Lab 04/18/18 1215  NA 151*  K 4.3  CL 113*  CO2 31  GLUCOSE 111*  BUN 46*  CREATININE 0.39*  CALCIUM 9.6   GFR: CrCl cannot be calculated (Unknown ideal weight.). Recent Labs  Lab 04/18/18 1215 04/18/18 1222  WBC 9.1  --   LATICACIDVEN  --  1.46    Liver Function Tests: Recent Labs  Lab 04/18/18 1215  AST 20  ALT 29  ALKPHOS 53  BILITOT 0.9  PROT 6.1*  ALBUMIN 2.7*   No results for input(s): LIPASE, AMYLASE in the last 168 hours. No results for input(s): AMMONIA in the last 168 hours.  ABG    Component Value Date/Time   PHART 7.500 (H) 04/18/2018 1221   PCO2ART 37.3 04/18/2018 1221   PO2ART 78.3 (L) 04/18/2018 1221   HCO3 28.9 (H) 04/18/2018 1221   TCO2 27 06/26/2017 0210   ACIDBASEDEF 2.0 06/26/2017 0210   O2SAT 96.6 04/18/2018 1221     Coagulation Profile: No results for input(s): INR, PROTIME in the last 168 hours.  Cardiac Enzymes: Recent Labs  Lab 04/18/18 1215  TROPONINI 0.03*    HbA1C: Hgb A1c MFr Bld  Date/Time Value Ref Range Status  02/13/2017 09:35 PM 5.1 4.8 - 5.6 % Final    Comment:    (NOTE) Pre diabetes:          5.7%-6.4% Diabetes:              >6.4% Glycemic control for   <7.0% adults with diabetes     CBG: No results for input(s): GLUCAP in the last 168 hours.  Review of Systems:    Unable to complete due to AMS / vent dependent.   Past Medical History  She,  has a past medical history of Adnexal mass, Anxiety, Arthritis, Bladder tumor, Bleeding ulcer, CHF (congestive heart failure) ([place]), CHF (congestive heart failure) ([place]), Chronic bronchitis ([place]), COPD (chronic obstructive pulmonary disease) ([place]), Coronary artery disease, DVT (deep  venous thrombosis) ([place]), GERD (gastroesophageal reflux disease), Heart block, History of bleeding ulcers, History of kidney stones, Hyperlipemia, Hypertension, Myocardial infarction ([place]), Pneumonia, Seizures ([place]) (02/2017 "several"; 06/26/2017 X 1), Shortness of breath dyspnea, and Stroke ([place]) (02/13/2018).   Surgical History    Past Surgical History:  Procedure Laterality Date  . ABDOMINAL HYSTERECTOMY    . CARDIAC CATHETERIZATION Left 10/21/2015   Procedure: Left Heart Cath and Coronary Angiography;  Surgeon: Isaias Cowman, MD;  Location: [facility]  CV LAB;  Service: Cardiovascular;  Laterality: Left;  . CARDIAC CATHETERIZATION N/A 10/21/2015   Procedure: Coronary Stent Intervention;  Surgeon: Isaias Cowman, MD;  Location: [place] [facility] LAB;  Service: Cardiovascular;  Laterality: N/A;  . CARDIAC CATHETERIZATION    . CYSTOSCOPY W/ RETROGRADES Bilateral 01/20/2016   Procedure: CYSTOSCOPY WITH RETROGRADE PYELOGRAM;  Surgeon: Hollice Espy, MD;  Location: [facility] ORS;  Service: Urology;  Laterality: Bilateral;  . CYSTOSCOPY WITH STENT PLACEMENT Right 01/20/2016   Procedure: CYSTOSCOPY WITH STENT PLACEMENT;  Surgeon: Hollice Espy, MD;  Location: [facility] ORS;  Service: Urology;  Laterality: Right;  . HIP FRACTURE SURGERY Left   . STOMACH SURGERY     "stomach ulcers"  . TRACHEOSTOMY TUBE PLACEMENT N/A 03/02/2017   Procedure: TRACHEOSTOMY;  Surgeon: Rozetta Nunnery, MD;  Location: [facility];  Service: ENT;  Laterality: N/A;  . TRANSURETHRAL RESECTION OF BLADDER TUMOR N/A 01/20/2016   Procedure: TRANSURETHRAL RESECTION OF BLADDER TUMOR (TURBT);  Surgeon: Hollice Espy, MD;  Location: [facility] ORS;  Service: Urology;  Laterality: N/A;     Social History   reports that she quit smoking about 19 years ago. She has a 40.00 pack-year smoking history. She has never used smokeless tobacco. She reports that she does not drink alcohol or use drugs.   Family History   Her family history  includes CVA in her father; Stomach cancer in her mother. There is no history of Bladder Cancer or Kidney cancer.   Allergies No Known Allergies   Home Medications  Prior to Admission medications   Medication Sig Start Date End Date Taking? Authorizing Provider  acetaminophen (TYLENOL) 325 MG tablet Take 2 tablets (650 mg total) by mouth every 6 (six) hours as needed for mild pain (or Fever >/= 101). 07/08/17   Velvet Bathe, MD  albuterol (PROVENTIL) (2.5 MG/3ML) 0.083% nebulizer solution Take 3 mLs (2.5 mg total) by nebulization every 2 (two) hours as needed for wheezing. 02/22/17   Tukov-Yual, Arlyss Gandy, NP  Amino Acids-Protein Hydrolys (FEEDING SUPPLEMENT, PRO-STAT SUGAR FREE 64,) LIQD Take 30 mLs by mouth 3 (three) times daily. Patient not taking: Reported on 08/24/2017 07/08/17   Velvet Bathe, MD  amLODipine (NORVASC) 10 MG tablet Take 1 tablet (10 mg total) by mouth daily. 07/08/17   Velvet Bathe, MD  carvedilol (COREG) 6.25 MG tablet Take 1 tablet (6.25 mg total) by mouth 2 (two) times daily with a meal. 07/08/17   Velvet Bathe, MD  furosemide (LASIX) 40 MG tablet Take 1 tablet (40 mg total) by mouth daily. 07/08/17   Velvet Bathe, MD  levETIRAcetam (KEPPRA) 100 MG/ML solution Take 10 mLs (1,000 mg total) by mouth 2 (two) times daily. Patient not taking: Reported on 08/24/2017 07/07/17   Velvet Bathe, MD  levETIRAcetam (KEPPRA) 500 MG tablet Take 1,000 mg by mouth 2 (two) times daily.    [provider]  lisinopril (PRINIVIL,ZESTRIL) 2.5 MG tablet Take 1 tablet (2.5 mg total) by mouth daily. 07/08/17   Velvet Bathe, MD  Multiple Vitamin (MULTIVITAMIN) LIQD Take 15 mLs by mouth daily. Patient not taking: Reported on 08/24/2017 07/08/17   Velvet Bathe, MD  Multiple Vitamin [facility]) TABS Take 1 tablet by mouth daily.    [provider]  pantoprazole (PROTONIX) 40 MG tablet Take 40 mg by mouth daily.    [provider]  pantoprazole sodium (PROTONIX) 40 mg/20 mL PACK  Take 20 mLs (40 mg total) by mouth daily. Patient not taking: Reported on 08/24/2017 07/08/17   Velvet Bathe,  MD  Valproate Sodium (DEPAKENE) 250 MG/5ML SOLN solution Take 10 mLs (500 mg total) by mouth 2 (two) times daily. Patient not taking: Reported on 08/24/2017 07/08/17   Velvet Bathe, MD  valproic acid (DEPAKENE) 250 MG capsule Take 500 mg by mouth 2 (two) times daily.    [provider]     Critical care time:      Noe Gens, NP-C [facility] Pgr: [phone] or if no answer [phone] 04/18/2018, 3:28 PM

## 2018-04-18 NOTE — ED Triage Notes (Signed)
Transported by [facility] from [facility]-- staff reported to [place], increased lethargy, electrolyte imbalance and no relief with IV antibiotics. Patient currently on the vent via trach collar. VSS with [facility].

## 2018-04-18 NOTE — ED Notes (Signed)
Patient transported to CT 

## 2018-04-18 NOTE — ED Provider Notes (Signed)
[facility] DEPT Provider Note   CSN: [id number] Arrival date & time: 04/18/18  1151     History   Chief Complaint Chief Complaint  Patient presents with  . Respiratory Distress    HPI Melissa Cochran is a 82 y.o. female.  [facility].  [facility] was called because of increased lethargy, electrolyte imbalance.  We don't know what exact electrolyte imbalances we need to evaluate.  The pt is normally awake and will look at you, but she has not been doing this behavior.  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  She is ventilator dependent.  The pt has a PICC line and is getting treated for pneumonia with IV vancomycin and IV Meropenem.  The pt is supposed to be on depakote and keppra for seizures.  Per EMS, depakote level was low, so the nurses were worried pt was seizing.  The pt has not had any seizure movements.  Pt is unable to give any hx.  She does have a hx of afib and is not on blood thinners due to hx of bleeding ulcers.     Past Medical History:  Diagnosis Date  . Adnexal mass   . Anxiety   . Arthritis   . Bladder tumor   . Bleeding ulcer   . CHF (congestive heart failure) ([place])   . CHF (congestive heart failure) ([place])   . Chronic bronchitis ([place])   . COPD (chronic obstructive pulmonary disease) ([place])   . Coronary artery disease   . DVT (deep venous thrombosis) ([facility])    RLE  . GERD (gastroesophageal reflux disease)   . Heart block    left  . History of bleeding ulcers   . History of kidney stones   . Hyperlipemia   . Hypertension   . Myocardial infarction ([place])    "slight one" (07/05/2017)  . Pneumonia    "several times" (07/05/2017)  . Seizures ([place]) 02/2017 "several"; 06/26/2017 X 1  . Shortness of breath dyspnea   . Stroke [facility]) 02/13/2018   "still right sided weakness" (07/05/2017)    Patient Active Problem List   Diagnosis Date Noted  . E-coli UTI, ESBL  06/30/2017    . A-fib ([place]) 06/30/2017  . Need for protective airway ventilation 06/30/2017  . Seizures ([place]) 06/26/2017  . Acute renal failure ([place])   . Elevated troponin I level   . Acute renal failure with tubular necrosis ([place])   . Palliative care encounter   . Pressure injury of skin 02/17/2017  . Bilateral lower extremity edema 10/08/2016  . Venous ulcer of ankle ([place]) 07/08/2016  . DVT (deep venous thrombosis) ([place]) 07/02/2016  . Swelling of limb 06/08/2016  . Pain in limb 06/08/2016  . Altered mental status 01/22/2016  . S/P coronary artery stent placement 10/30/2015  . Preop cardiovascular exam 10/08/2015  . SOB (shortness of breath) on exertion 10/08/2015  . Malignant neoplasm of trigone of bladder ([place]) 09/29/2015  . Mass of lower lobe of left lung, incidetnal on CT 09/2015, smoker.  09/29/2015  . Other disorders of lung 09/29/2015  . Other microscopic hematuria 08/26/2015  . Kidney stone 08/26/2015  . Urge incontinence 08/26/2015  . Pneumonia 01/24/2015  . PNA (pneumonia) 01/24/2015  . Essential (primary) hypertension 12/09/2014  . Adnexal mass 11/28/2013  . History of cardiac catheterization 11/28/2013  . Hyperlipidemia 11/28/2013  . Other specified postprocedural states 11/28/2013    Past Surgical  History:  Procedure Laterality Date  . ABDOMINAL HYSTERECTOMY    . CARDIAC CATHETERIZATION Left 10/21/2015   Procedure: Left Heart Cath and Coronary Angiography;  Surgeon: Isaias Cowman, MD;  Location: [facility];  Service: Cardiovascular;  Laterality: Left;  . CARDIAC CATHETERIZATION N/A 10/21/2015   Procedure: Coronary Stent Intervention;  Surgeon: Isaias Cowman, MD;  Location: [facility];  Service: Cardiovascular;  Laterality: N/A;  . CARDIAC CATHETERIZATION    . CYSTOSCOPY W/ RETROGRADES Bilateral 01/20/2016   Procedure: CYSTOSCOPY WITH RETROGRADE PYELOGRAM;  Surgeon: Hollice Espy, MD;  Location: [facility] ORS;  Service: Urology;  Laterality:  Bilateral;  . CYSTOSCOPY WITH STENT PLACEMENT Right 01/20/2016   Procedure: CYSTOSCOPY WITH STENT PLACEMENT;  Surgeon: Hollice Espy, MD;  Location: [facility] ORS;  Service: Urology;  Laterality: Right;  . HIP FRACTURE SURGERY Left   . STOMACH SURGERY     "stomach ulcers"  . TRACHEOSTOMY TUBE PLACEMENT N/A 03/02/2017   Procedure: TRACHEOSTOMY;  Surgeon: Rozetta Nunnery, MD;  Location: [facility];  Service: ENT;  Laterality: N/A;  . TRANSURETHRAL RESECTION OF BLADDER TUMOR N/A 01/20/2016   Procedure: TRANSURETHRAL RESECTION OF BLADDER TUMOR (TURBT);  Surgeon: Hollice Espy, MD;  Location: [facility] ORS;  Service: Urology;  Laterality: N/A;     OB History   None      Home Medications    Prior to Admission medications   Medication Sig Start Date End Date Taking? Authorizing Provider  acetaminophen (TYLENOL) 325 MG tablet Take 2 tablets (650 mg total) by mouth every 6 (six) hours as needed for mild pain (or Fever >/= 101). 07/08/17   Velvet Bathe, MD  albuterol (PROVENTIL) (2.5 MG/3ML) 0.083% nebulizer solution Take 3 mLs (2.5 mg total) by nebulization every 2 (two) hours as needed for wheezing. 02/22/17   Tukov-Yual, Arlyss Gandy, NP  Amino Acids-Protein Hydrolys (FEEDING SUPPLEMENT, PRO-STAT SUGAR FREE 64,) LIQD Take 30 mLs by mouth 3 (three) times daily. Patient not taking: Reported on 08/24/2017 07/08/17   Velvet Bathe, MD  amLODipine (NORVASC) 10 MG tablet Take 1 tablet (10 mg total) by mouth daily. 07/08/17   Velvet Bathe, MD  carvedilol (COREG) 6.25 MG tablet Take 1 tablet (6.25 mg total) by mouth 2 (two) times daily with a meal. 07/08/17   Velvet Bathe, MD  furosemide (LASIX) 40 MG tablet Take 1 tablet (40 mg total) by mouth daily. 07/08/17   Velvet Bathe, MD  levETIRAcetam (KEPPRA) 100 MG/ML solution Take 10 mLs (1,000 mg total) by mouth 2 (two) times daily. Patient not taking: Reported on 08/24/2017 07/07/17   Velvet Bathe, MD  levETIRAcetam (KEPPRA) 500 MG tablet Take 1,000 mg by mouth 2 (two)  times daily.    [provider]  lisinopril (PRINIVIL,ZESTRIL) 2.5 MG tablet Take 1 tablet (2.5 mg total) by mouth daily. 07/08/17   Velvet Bathe, MD  Multiple Vitamin (MULTIVITAMIN) LIQD Take 15 mLs by mouth daily. Patient not taking: Reported on 08/24/2017 07/08/17   Velvet Bathe, MD  Multiple Vitamin [facility]) TABS Take 1 tablet by mouth daily.    [provider]  pantoprazole (PROTONIX) 40 MG tablet Take 40 mg by mouth daily.    [provider]  pantoprazole sodium (PROTONIX) 40 mg/20 mL PACK Take 20 mLs (40 mg total) by mouth daily. Patient not taking: Reported on 08/24/2017 07/08/17   Velvet Bathe, MD  Valproate Sodium (DEPAKENE) 250 MG/5ML SOLN solution Take 10 mLs (500 mg total) by mouth 2 (two) times daily. Patient not taking: Reported on 08/24/2017  07/08/17   Velvet Bathe, MD  valproic acid (DEPAKENE) 250 MG capsule Take 500 mg by mouth 2 (two) times daily.    [provider]    Family History Family History  Problem Relation Age of Onset  . Stomach cancer Mother   . CVA Father   . Bladder Cancer Neg Hx   . Kidney cancer Neg Hx     Social History Social History   Tobacco Use  . Smoking status: Former Smoker    Packs/day: 1.00    Years: 40.00    Pack years: 40.00    Last attempt to quit: 06/13/1998    Years since quitting: 19.8  . Smokeless tobacco: Never Used  Substance Use Topics  . Alcohol use: No  . Drug use: No     Allergies   Patient has no known allergies.   Review of Systems Review of Systems  Unable to perform ROS: Mental status change     Physical Exam Updated Vital Signs BP 132/78   Pulse 92   Resp (!) 23   Ht 5\' 4"  (1.626 m)   SpO2 97%   BMI 22.31 kg/m   Physical Exam  Constitutional: She appears well-developed and well-nourished.  HENT:  Head: Normocephalic and atraumatic.  Right Ear: External ear normal.  Left Ear: External ear normal.  Nose: Nose normal.  Eyes: Pupils are equal, round, and reactive to  light. Conjunctivae and EOM are normal.  Neck: Neck supple.  Trach in place  Cardiovascular: Normal rate, normal heart sounds and intact distal pulses. An irregular rhythm present.  Pulmonary/Chest: Effort normal and breath sounds normal.  Abdominal: Soft. Bowel sounds are normal.  Musculoskeletal: Normal range of motion.  Neurological: She is unresponsive.  Skin: Skin is warm. Capillary refill takes less than 2 seconds.  Decubitus ulcers to back and sacrum  Psychiatric:  Unable to asses  Nursing note and vitals reviewed.    ED Treatments / Results  Labs (all labs ordered are listed, but only abnormal results are displayed) Labs Reviewed  COMPREHENSIVE METABOLIC PANEL - Abnormal; Notable for the following components:      Result Value   Sodium 151 (*)    Chloride 113 (*)    Glucose, Bld 111 (*)    BUN 46 (*)    Creatinine, Ser 0.39 (*)    Total Protein 6.1 (*)    Albumin 2.7 (*)    All other components within normal limits  CBC WITH DIFFERENTIAL/PLATELET - Abnormal; Notable for the following components:   RBC 3.28 (*)    Hemoglobin 8.7 (*)    HCT 34.1 (*)    MCV 104.0 (*)    MCHC 25.5 (*)    RDW 20.9 (*)    nRBC 0.4 (*)    Abs Immature Granulocytes 0.10 (*)    All other components within normal limits  TROPONIN I - Abnormal; Notable for the following components:   Troponin I 0.03 (*)    All other components within normal limits  BLOOD GAS, ARTERIAL - Abnormal; Notable for the following components:   pH, Arterial 7.500 (*)    pO2, Arterial 78.3 (*)    Bicarbonate 28.9 (*)    Acid-Base Excess 5.7 (*)    All other components within normal limits  VALPROIC ACID LEVEL - Abnormal; Notable for the following components:   Valproic Acid Lvl <10 (*)    All other components within normal limits  CULTURE, BLOOD (ROUTINE X 2)  CULTURE, BLOOD (ROUTINE X 2)  URINE CULTURE  CULTURE, RESPIRATORY  URINALYSIS, ROUTINE W REFLEX MICROSCOPIC  I-STAT CG4 LACTIC ACID, ED  POC OCCULT  BLOOD, ED    EKG EKG Interpretation  Date/Time:  Tuesday April 18 2018 12:14:04 EST Ventricular Rate:  101 PR Interval:    QRS Duration: 79 QT Interval:  356 QTC Calculation: 462 R Axis:   -30 Text Interpretation:  Atrial fibrillation Inferior infarct, old No significant change since last tracing Confirmed by Isla Pence [phone]) on 04/18/2018 3:38:22 PM  EKG not coming through on MUSE  HR 101 afib.  No st or t wave changes.   Radiology Dg Chest Port 1 View  Result Date: 04/18/2018 CLINICAL DATA:  Electrolyte imbalance, increased lethargy. The patient is a tracheostomy patient and is undergoing ventilation. History of CHF and COPD. EXAM: PORTABLE CHEST 1 VIEW COMPARISON:  Portable chest x-ray of August 24, 2017 FINDINGS: The lungs are adequately inflated. There is no focal infiltrate. There is no pleural effusion or pneumothorax. The cardiac silhouette is mildly enlarged. The pulmonary vascularity is normal. The endotracheal tube tip projects approximately 3 cm above the carina. The right-sided PICC line tip projects over the midportion of the SVC. There is calcification in the wall of the aortic arch. There is calcification of the mitral valvular annulus. IMPRESSION: No acute pneumonia. Stable mild cardiomegaly without pulmonary edema. The endotracheal tube placed via the tracheostomy is in reasonable position. Thoracic aortic atherosclerosis. Electronically Signed   By: David  Martinique M.D.   On: 04/18/2018 12:18    Procedures Procedures (including critical care time)  Medications Ordered in ED Medications  pantoprazole (PROTONIX) injection 40 mg (has no administration in time range)  sodium chloride 0.9 % bolus 1,000 mL (0 mLs Intravenous Stopped 04/18/18 1310)    And  sodium chloride 0.9 % bolus 1,000 mL (0 mLs Intravenous Stopped 04/18/18 1432)     Initial Impression / Assessment and Plan / ED Course  I have reviewed the triage vital signs and the nursing  notes.  Pertinent labs & imaging results that were available during my care of the patient were reviewed by me and considered in my medical decision making (see chart for details).    Code sepsis called as we were not sure exactly what was going on with patient and she is getting treatment for her PNA.  However, she does not have a fever and does not appear to be septic.  She did get a dose of her vancomycin and meropenem today.  Pharmacy is on the case and is trying to find out exact times of med administrations.  Pt is hypernatremic, so is given IVFs.  Depakote level is low, but she does not appear to be seizing.  She is not tachycardic after IVFs.  She is not having any seizure mvmts.  I am not sure why it is low, unless the nurses have not been giving it.  Pt has a chronic vent, so I spoke with Dr. Nelda Marseille who will manage the vent.  He does not think she needs to be in the ICU.  Pt is anemic, but stool is guaiac negative.  She does have a hx of bleeding ulcers, so I gave her protonix.  We don't have records from [facility].  I asked the secretary to see if they will send records over.    CRITICAL CARE Performed by: Isla Pence   Total critical care time: 30 minutes  Critical care time was exclusive of separately billable procedures and treating other patients.  Critical care was necessary to treat or prevent imminent or life-threatening deterioration.  Critical care was time spent personally by me on the following activities: development of treatment plan with patient and/or surrogate as well as nursing, discussions with consultants, evaluation of patient's response to treatment, examination of patient, obtaining history from patient or surrogate, ordering and performing treatments and interventions, ordering and review of laboratory studies, ordering and review of radiographic studies, pulse oximetry and re-evaluation of patient's condition.  Pt d/w Dr. Florene Glen (triad) for  admission.  Final Clinical Impressions(s) / ED Diagnoses   Final diagnoses:  Hypernatremia  Ventilator dependent ([place])  Acute on chronic anemia    ED Discharge Orders    None       Isla Pence, MD 04/18/18 1541

## 2018-04-18 NOTE — ED Notes (Signed)
Critical troponin 0.03 reported to RN

## 2018-04-18 NOTE — Consult Note (Signed)
NEURO HOSPITALIST CONSULT NOTE   Requestig physician: Dr. Florene Glen  Reason for Consult: AMS  History obtained from:  Family and Chart     HPI:                                                                                                                                          Melissa Cochran is an 81 y.o. female with a history of stroke and post-stroke seizures, who presents from her 26 with AMS in the setting of pneumonia. She has a tracheostomy, is on the ventilator and is PEG dependent at baseline. About 2 weeks ago, per family, the patient was conversant. In the setting of a pneumonia which for which she has been undergoing treatment with antibiotics, her mentation has declined. The family was told that her worsened mentation was most likely due to the pneumonia, but staff at the [facility] felt that she may also be in nonconvulsive status due to valproic acid levels being low. However, family states that they have not seen any jerking or shaking to indicate a seizure and they were not informed of her exhibiting seizure like movements by staff at the [facility] or here. Valproic acid level obtained today was subtherapeutic at < 10. Per chart review, she is also on Keppra for seizure prophylaxis. Labs reveal elevated BUN/Cr ratio as well as hypernatremia, concerning for possible volume depletion. CT head was obtained today with images reviewed: No acute abnormality is seen; a stable right cranial convexity meningioma is seen.   CT head:  1. No definite CT evidence for acute intracranial abnormality. 2. Atrophy and small vessel ischemic changes of the white matter. 3. Stable 2 cm right cranial convexity meningioma  Past Medical History:  Diagnosis Date  . Adnexal mass   . Anxiety   . Arthritis   . Bladder tumor   . Bleeding ulcer   . CHF (congestive heart failure) ([place])   . CHF (congestive heart failure) ([place])   . Chronic bronchitis ([place])   . COPD (chronic obstructive  pulmonary disease) ([place])   . Coronary artery disease   . DVT (deep venous thrombosis) ([facility])    RLE  . GERD (gastroesophageal reflux disease)   . Heart block    left  . History of bleeding ulcers   . History of kidney stones   . Hyperlipemia   . Hypertension   . Myocardial infarction ([place])    "slight one" (07/05/2017)  . Pneumonia    "several times" (07/05/2017)  . Seizures ([place]) 02/2017 "several"; 06/26/2017 X 1  . Shortness of breath dyspnea   . Stroke [facility]) 02/13/2018   "still right sided weakness" (07/05/2017)    Past Surgical History:  Procedure Laterality Date  . ABDOMINAL HYSTERECTOMY    . CARDIAC CATHETERIZATION  Left 10/21/2015   Procedure: Left Heart Cath and Coronary Angiography;  Surgeon: Isaias Cowman, MD;  Location: [place] [facility];  Service: Cardiovascular;  Laterality: Left;  . CARDIAC CATHETERIZATION N/A 10/21/2015   Procedure: Coronary Stent Intervention;  Surgeon: Isaias Cowman, MD;  Location: [facility];  Service: Cardiovascular;  Laterality: N/A;  . CARDIAC CATHETERIZATION    . CYSTOSCOPY W/ RETROGRADES Bilateral 01/20/2016   Procedure: CYSTOSCOPY WITH RETROGRADE PYELOGRAM;  Surgeon: Hollice Espy, MD;  Location: [facility] OR[facility];  Service: Urology;  Laterality: Bilateral;  . CYSTOSCOPY WITH STENT PLACEMENT Right 01/20/2016   Procedure: CYSTOSCOPY WITH STENT PLACEMENT;  Surgeon: Hollice Espy, MD;  Location: [facility] OR[facility];  Service: Urology;  Laterality: Right;  . HIP FRACTURE SURGERY Left   . STOMACH SURGERY     "stomach ulcers"  . TRACHEOSTOMY TUBE PLACEMENT N/A 03/02/2017   Procedure: TRACHEOSTOMY;  Surgeon: Rozetta Nunnery, MD;  Location: [facility];  Service: ENT;  Laterality: N/A;  . TRANSURETHRAL RESECTION OF BLADDER TUMOR N/A 01/20/2016   Procedure: TRANSURETHRAL RESECTION OF BLADDER TUMOR (TURBT);  Surgeon: Hollice Espy, MD;  Location: [facility] OR[facility];  Service: Urology;  Laterality: N/A;    Family History  Problem Relation Age of Onset  .  Stomach cancer Mother   . CVA Father   . Bladder Cancer Neg Hx   . Kidney cancer Neg Hx               Social History:  reports that she quit smoking about 19 years ago. She has a 40.00 pack-year smoking history. She has never used smokeless tobacco. She reports that she does not drink alcohol or use drugs.  No Known Allergies  MEDICATIONS:                                                                                                                     Scheduled: . free water  200 mL Per Tube Q8H  . [START ON 04/19/2018] pantoprazole sodium  40 mg Per Tube Daily  . sodium chloride flush  3 mL Intravenous Q12H   Continuous: . lactated ringers 75 mL/hr at 04/18/18 2016  . levETIRAcetam 1,000 mg (04/18/18 2019)  . valproate sodium       ROS:  Unable to obtain due to AMS.    Blood pressure (!) 105/58, pulse 97, temperature 98.6 F (37 C), temperature source Axillary, resp. rate 19, height _0  (1.626 m), weight 63.5 kg, SpO2 97 %.   General Examination:                                                                                                       Physical Exam  HEENT-  Trach collar in place. Torticollis to the right is noted with increased tone to right side of neck musculature.  Extremities- Warm and well-perfused   Neurological Examination Mental Status: Nonverbal. Not following commands. Little to no spontaneous movement. Noted to be smiling but not gazing at examiner, RN or family. Not agitated. Does not appear to be in any pain or discomfort.  Cranial Nerves: II: PERRL. Does not gaze towards visual stimuli.   III,IV, VI: Tends to keep eyes closed and resists when examiner attempts to passively open. No forced gaze deviation or nystagmus.  V,VII: Face grossly symmetric. Deferred noxious stimulation VIII: Not responding to  voice IX,X: Unable to assess XI: Unable to assess XII: Unable to assess Motor/Sensory: Increased tone bilateral upper extremities. Not moving limbs to command or spontaneously. Increased extensor tone bilateral lower extremities. Slight withdrawal to noxious plantar stimulation bilaterally.  Sensory: Pinprick and light touch intact throughout, bilaterally Deep Tendon Reflexes: Low amplitude brisk reflexes Plantars: Mute bilaterally Cerebellar/Gait: Unable to assess Other: No jerking, twitching, tremor, nystagmus, forced gaze deviation, posturing or other motor activity suggestive of seizure.     Lab Results: Basic Metabolic Panel: Recent Labs  Lab 04/18/18 1215  NA 151*  K 4.3  CL 113*  CO2 31  GLUCOSE 111*  BUN 46*  CREATININE 0.39*  CALCIUM 9.6    CBC: Recent Labs  Lab 04/18/18 1215  WBC 9.1  NEUTROABS 6.8  HGB 8.7*  HCT 34.1*  MCV 104.0*  PLT 186    Cardiac Enzymes: Recent Labs  Lab 04/18/18 1215  TROPONINI 0.03*    Lipid Panel: No results for input(s): CHOL, TRIG, HDL, CHOLHDL, VLDL, LDLCALC in the last 168 hours.  Imaging: Ct Head Wo Contrast  Result Date: 04/18/2018 CLINICAL DATA:  Encephalopathy EXAM: CT HEAD WITHOUT CONTRAST TECHNIQUE: Contiguous axial images were obtained from the base of the skull through the vertex without intravenous contrast. COMPARISON:  CT 08/24/2017, MRI 06/28/2017 FINDINGS: Brain: Evaluation is limited by patient positioning. No acute territorial infarction, hemorrhage or new mass is visualized. 2 cm right cranial convexity meningioma, no change. Moderate-to-marked atrophy. Small focus of encephalomalacia in the right occipital lobe. Moderate small vessel ischemic changes of the white matter. Stable ventricle size. Vascular: No hyperdense vessels.  Carotid vascular calcification. Skull: No fracture Sinuses/Orbits: Mucosal disease in the sphenoid and ethmoid sinuses. Other: None IMPRESSION: 1. No definite CT evidence for acute  intracranial abnormality. 2. Atrophy and small vessel ischemic changes of the white matter. 3. Stable 2 cm right cranial convexity meningioma. Electronically Signed   By: Donavan Foil M.D.   On: 04/18/2018 18:16   Dg Chest  Port 1 View  Result Date: 04/18/2018 CLINICAL DATA:  Electrolyte imbalance, increased lethargy. The patient is a tracheostomy patient and is undergoing ventilation. History of CHF and COPD. EXAM: PORTABLE CHEST 1 VIEW COMPARISON:  Portable chest x-ray of August 24, 2017 FINDINGS: The lungs are adequately inflated. There is no focal infiltrate. There is no pleural effusion or pneumothorax. The cardiac silhouette is mildly enlarged. The pulmonary vascularity is normal. The endotracheal tube tip projects approximately 3 cm above the carina. The right-sided PICC line tip projects over the midportion of the SVC. There is calcification in the wall of the aortic arch. There is calcification of the mitral valvular annulus. IMPRESSION: No acute pneumonia. Stable mild cardiomegaly without pulmonary edema. The endotracheal tube placed via the tracheostomy is in reasonable position. Thoracic aortic atherosclerosis. Electronically Signed   By: David  Martinique M.D.   On: 04/18/2018 12:18    Assessment: 82 year old female with AMS in the setting of PNA, hypernatremia and probable volume depletion 1. No evidence for seizure activity on examination. Her AMS is most likely multifactorial, with volume depletion and PNA most likely playing a significant role.  2. No new stroke on CT to suggest a new stroke as the etiology for her AMS.  3. Right frontal meningioma on CT is noted.  4. Torticollis. Will need gentle PT exercises for this.   Recommendations: 1. EEG in the morning.  2. Continue VPA and Keppra. Obtain repeat VPA level in 2 days.  3. Consider an MRI brain if she does not improve with volume repletion 4. Magnesium level, ESR, RPR, ammonia level, vitamin B12, TSH  5. Gentle PT for management  of her torticollis   Electronically signed: Dr. Kerney Elbe 04/18/2018, 8:14 PM

## 2018-04-19 ENCOUNTER — Inpatient Hospital Stay (HOSPITAL_COMMUNITY): Payer: Medicare Other

## 2018-04-19 ENCOUNTER — Inpatient Hospital Stay (HOSPITAL_COMMUNITY)
Admit: 2018-04-19 | Discharge: 2018-04-19 | Disposition: A | Discharge status: Home / Self Care | Payer: Medicare Other | Attending: Emergency Medicine | Admitting: Emergency Medicine

## 2018-04-19 ENCOUNTER — Encounter (HOSPITAL_COMMUNITY): Payer: Self-pay | Admitting: Internal Medicine

## 2018-04-19 DIAGNOSIS — I639 Cerebral infarction, unspecified: Secondary | ICD-10-CM

## 2018-04-19 DIAGNOSIS — J9610 Chronic respiratory failure, unspecified whether with hypoxia or hypercapnia: Secondary | ICD-10-CM

## 2018-04-19 HISTORY — DX: Chronic respiratory failure, unspecified whether with hypoxia or hypercapnia: J96.10

## 2018-04-19 HISTORY — DX: Cerebral infarction, unspecified: I63.9

## 2018-04-19 LAB — CBC
HCT: 33 % — ABNORMAL LOW (ref 36.0–46.0)
Hemoglobin: 8.9 g/dL — ABNORMAL LOW (ref 12.0–15.0)
MCH: 27 pg (ref 26.0–34.0)
MCHC: 27 g/dL — ABNORMAL LOW (ref 30.0–36.0)
MCV: 100 fL (ref 80.0–100.0)
Platelets: 172 10*3/uL (ref 150–400)
RBC: 3.3 MIL/uL — ABNORMAL LOW (ref 3.87–5.11)
RDW: 20.2 % — ABNORMAL HIGH (ref 11.5–15.5)
WBC: 7.8 10*3/uL (ref 4.0–10.5)
nRBC: 0.4 % — ABNORMAL HIGH (ref 0.0–0.2)

## 2018-04-19 LAB — GLUCOSE, CAPILLARY
Glucose-Capillary: 104 mg/dL — ABNORMAL HIGH (ref 70–99)
Glucose-Capillary: 107 mg/dL — ABNORMAL HIGH (ref 70–99)
Glucose-Capillary: 115 mg/dL — ABNORMAL HIGH (ref 70–99)
Glucose-Capillary: 121 mg/dL — ABNORMAL HIGH (ref 70–99)

## 2018-04-19 LAB — COMPREHENSIVE METABOLIC PANEL
ALK PHOS: 45 U/L (ref 38–126)
ALT: 22 U/L (ref 0–44)
AST: 16 U/L (ref 15–41)
Albumin: 2.2 g/dL — ABNORMAL LOW (ref 3.5–5.0)
Anion gap: 8 (ref 5–15)
BUN: 40 mg/dL — ABNORMAL HIGH (ref 8–23)
CO2: 27 mmol/L (ref 22–32)
Calcium: 9.1 mg/dL (ref 8.9–10.3)
Chloride: 116 mmol/L — ABNORMAL HIGH (ref 98–111)
Creatinine, Ser: 0.32 mg/dL — ABNORMAL LOW (ref 0.44–1.00)
GFR calc Af Amer: >60 mL/min (ref >60)
GFR calc non Af Amer: >60 mL/min (ref >60)
Glucose, Bld: 103 mg/dL — ABNORMAL HIGH (ref 70–99)
Potassium: 3.9 mmol/L (ref 3.5–5.1)
Sodium: 151 mmol/L — ABNORMAL HIGH (ref 135–145)
Total Bilirubin: 1.1 mg/dL (ref 0.3–1.2)
Total Protein: 5.5 g/dL — ABNORMAL LOW (ref 6.5–8.1)

## 2018-04-19 LAB — RAPID URINE DRUG SCREEN, HOSP PERFORMED
Amphetamines: NOT DETECTED
Barbiturates: NOT DETECTED
Benzodiazepines: POSITIVE — AB
Cocaine: NOT DETECTED
Opiates: NOT DETECTED
Tetrahydrocannabinol: NOT DETECTED

## 2018-04-19 LAB — PROCALCITONIN
Procalcitonin: <0.1 ng/mL
Procalcitonin: <0.1 ng/mL

## 2018-04-19 LAB — URINE CULTURE: CULTURE: NO GROWTH

## 2018-04-19 LAB — FOLATE: Folate: 32.9 ng/mL (ref >5.9)

## 2018-04-19 LAB — MAGNESIUM: Magnesium: 2.4 mg/dL (ref 1.7–2.4)

## 2018-04-19 LAB — RPR: RPR Ser Ql: NONREACTIVE

## 2018-04-19 LAB — OCCULT BLOOD X 1 CARD TO LAB, STOOL: Fecal Occult Bld: NEGATIVE

## 2018-04-19 LAB — PHOSPHORUS
Phosphorus: 2.4 mg/dL — ABNORMAL LOW (ref 2.5–4.6)
Phosphorus: 2.4 mg/dL — ABNORMAL LOW (ref 2.5–4.6)

## 2018-04-19 MED ORDER — CARVEDILOL 6.25 MG PO TABS
6.2500 mg | ORAL_TABLET | Freq: Two times a day (BID) | ORAL | Status: DC
  Administered 2018-04-19 – 2018-05-02 (×26): 6.25 mg via ORAL
  Filled 2018-04-19 (×28): qty 1

## 2018-04-19 MED ORDER — PRO-STAT SUGAR FREE PO LIQD
30.0000 mL | Freq: Every day | ORAL | Status: DC
  Administered 2018-04-19 – 2018-04-24 (×6): 30 mL
  Filled 2018-04-19 (×6): qty 30

## 2018-04-19 MED ORDER — FAMOTIDINE 20 MG PO TABS
20.0000 mg | ORAL_TABLET | Freq: Every day | ORAL | Status: DC
  Administered 2018-04-19 – 2018-05-01 (×13): 20 mg via ORAL
  Filled 2018-04-19 (×13): qty 1

## 2018-04-19 MED ORDER — ORAL CARE MOUTH RINSE
15.0000 mL | OROMUCOSAL | Status: DC
  Administered 2018-04-19 – 2018-05-02 (×129): 15 mL via OROMUCOSAL

## 2018-04-19 MED ORDER — CHLORHEXIDINE GLUCONATE 0.12% ORAL RINSE (MEDLINE KIT)
15.0000 mL | Freq: Two times a day (BID) | OROMUCOSAL | Status: DC
  Administered 2018-04-19 – 2018-05-02 (×26): 15 mL via OROMUCOSAL

## 2018-04-19 MED ORDER — COLLAGENASE 250 UNIT/GM EX OINT
TOPICAL_OINTMENT | Freq: Every day | CUTANEOUS | Status: DC
  Administered 2018-04-19: 13:00:00 via TOPICAL
  Administered 2018-04-20: 1 via TOPICAL
  Administered 2018-04-21 – 2018-04-29 (×4): via TOPICAL
  Filled 2018-04-19 (×2): qty 90

## 2018-04-19 MED ORDER — ASPIRIN 81 MG PO CHEW
81.0000 mg | CHEWABLE_TABLET | Freq: Every day | ORAL | Status: DC
  Administered 2018-04-19 – 2018-05-02 (×14): 81 mg via ORAL
  Filled 2018-04-19 (×14): qty 1

## 2018-04-19 MED ORDER — VITAL HIGH PROTEIN PO LIQD
1000.0000 mL | ORAL | Status: DC
  Administered 2018-04-19 – 2018-04-23 (×5): 1000 mL

## 2018-04-19 MED ORDER — APIXABAN 2.5 MG PO TABS
2.5000 mg | ORAL_TABLET | Freq: Two times a day (BID) | ORAL | Status: DC
  Administered 2018-04-19 – 2018-05-02 (×27): 2.5 mg
  Filled 2018-04-19 (×27): qty 1

## 2018-04-19 MED ORDER — HEPARIN SODIUM (PORCINE) 5000 UNIT/ML IJ SOLN: 5000.0000 [IU] | Freq: Three times a day (TID) | INTRAMUSCULAR | Status: DC

## 2018-04-19 NOTE — Procedures (Signed)
ELECTROENCEPHALOGRAM REPORT   Patient: Melissa Cochran       Room #: 1540 EEG No. ID: [id number] Age: 82 y.o.        Sex: female Referring Physician: Purohit Report Date:  04/19/2018        Interpreting Physician: Alexis Goodell  History: AJANE NOVELLA is an 82 y.o. female with altered mental status  Medications:  Eliquis, ASA, Coreg, Santyl, Keppra, Protonix, Depacon  Conditions of Recording:  This is a 21 channel routine scalp EEG performed with bipolar and monopolar montages arranged in accordance to the international 10/20 system of electrode placement. One channel was dedicated to EKG recording.  The patient is in the drowsy and asleep states.  Description:  The patient does not achieve wakefulness during the recording for a waking background rhythm to be evaluated.  During drowse the background is noted to be slow and irregular with low voltage theta and beta activity noted.   The patient goes in to a light sleep with symmetrical sleep spindles, vertex central sharp transients and irregular slow activity. There are some occasional sharp transients noted over the right hemisphere emanating from the frontotemporal region.  .     Hyperventilation and intermittent photic stimulation were not performed.  IMPRESSION: This is an abnormal electroencephalogram due to occasional sharp activity over the right frontotemporal region. This is suggestive of a focal disturbance over right hemisphere with epileptogenic potential.    Alexis Goodell, MD Neurology [phone] 04/19/2018, 3:09 PM

## 2018-04-19 NOTE — Progress Notes (Signed)
EEG resulted: This is an abnormal electroencephalogram due to occasional sharp activity over the right frontotemporal region. This is suggestive of a focal disturbance over right hemisphere with epileptogenic potential.   A/R: 82 year old female with AMS in the setting of PNA, hypernatremia and probable volume depletion -- The EEG finding most likely reflects cortical irritability from the patient's known meningioma.  -- TSH normal. RPR nonreactive. Mg normal. Ammonia normal. B12 normal.  -- Continue VPA and Keppra. Obtain repeat VPA level on Thursday.  -- Consider an MRI brain if she does not improve with volume repletion -- Gentle PT for management of her torticollis -- Neurology will sign off. Please call if there are additional questions.   Electronically signed: Dr. Kerney Elbe

## 2018-04-19 NOTE — Progress Notes (Signed)
NAME:  Melissa Cochran, MRN:  [id number], DOB:  02-25-1936, LOS: 1 ADMISSION DATE:  04/18/2018, CONSULTATION DATE:  04/18/18 REFERRING MD:  Florene Glen, CHIEF COMPLAINT:  Acute encephalopathy   Brief History   82 y/o female who has been vent dependent since April 2019 was admitted to [facility] for acute change in mental status on 04/18/18. Came from [place].   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 rate (!) 27, height 5\' 4"  (1.626 m), weight 63.5 kg, SpO2 97 %.    Vent Mode: CPAP;PSV FiO2 (%):  [28 %-100 %] 28 % Set Rate:  [12 bmp-18 bmp] 12 bmp Vt Set:  [430 mL] 430 mL PEEP:  [5 cmH20] 5 cmH20 Pressure Support:  [10 cmH20] 10 cmH20 Plateau Pressure:  [14 cmH20-17 cmH20] 17 cmH20   Intake/Output Summary (Last 24 hours) at 04/19/2018 0957 Last data filed at 04/19/2018 0800 Gross per 24 hour  Intake 2973.79 ml  Output 405 ml  Net 2568.79 ml   Filed Weights   04/18/18 1810 04/19/18 0329  Weight: 63.5 kg 63.5 kg    Examination:  General:  In bed on vent HENT: NCAT trach in place PULM: CTA B, vent supported breathing CV: RRR, no mgr GI: BS+, soft, nontender, PEG in place MSK: normal bulk and tone Neuro: minimal response to voice, painful stimili    Resolved Hospital Problem list     Assessment & Plan:  Chronic respiratory failure with  hypoxemia: unclear if she has ever been liberated from the vent since 08/2017; most recent notes from [facility] in 11/2017 indicate that she remained vent dependent; [facility] 04/19/2018 > continue full vent support with PRVC > trach care per routine > will request records from [facility]  Acute encephalopathy: multifactorial, no one clear underlying cause > f/u EEG report > consider MRI  Seizure disorder > per neuro  Hypernatremia: per TRH > continue free water via tube > consider stopping D5 and transitioning free water to via tube only > repeat BMET in AM  I do not see evidence of pneumonia or acute infection    Best practice:  Diet: TRH Pain/Anxiety/Delirium protocol (if indicated): N/A VAP protocol (if indicated): yes DVT prophylaxis: add sub cutaneous heparin GI prophylaxis: n/a  Rest per [facility]  Labs   CBC: Recent Labs  Lab 04/18/18 1215 04/19/18 0415  WBC 9.1 7.8  NEUTROABS 6.8  --   HGB 8.7* 8.9*  HCT 34.1* 33.0*  MCV 104.0* 100.0  PLT 186 315    Basic Metabolic Panel: Recent Labs  Lab 04/18/18 1215 04/18/18 2145 04/19/18 0415  NA 151* 154* 151*  K 4.3 3.9 3.9  CL 113* 120* 116*  CO2 31 25 27   GLUCOSE 111* 103* 103*  BUN 46* 37* 40*  CREATININE 0.39* 0.32* 0.32*  CALCIUM 9.6 8.6* 9.1  MG  --  2.3 2.4   GFR: Estimated Creatinine Clearance: 48.4  mL/min (A) (by C-G formula based on SCr of 0.32 mg/dL (L)). Recent Labs  Lab 04/18/18 1215 04/18/18 1222 04/18/18 2145 04/19/18 0415  PROCALCITON  --   --  <0.10 <0.10  WBC 9.1  --   --  7.8  LATICACIDVEN  --  1.46  --   --     Liver Function Tests: Recent Labs  Lab 04/18/18 1215 04/19/18 0415  AST 20 16  ALT 29 22  ALKPHOS 53 45  BILITOT 0.9 1.1  PROT 6.1* 5.5*  ALBUMIN 2.7* 2.2*   No results for input(s): LIPASE, AMYLASE in the last 168 hours. Recent Labs  Lab 04/18/18 2145  AMMONIA 25    ABG    Component Value Date/Time    PHART 7.500 (H) 04/18/2018 1221   PCO2ART 37.3 04/18/2018 1221   PO2ART 78.3 (L) 04/18/2018 1221   HCO3 28.9 (H) 04/18/2018 1221   TCO2 27 06/26/2017 0210   ACIDBASEDEF 2.0 06/26/2017 0210   O2SAT 96.6 04/18/2018 1221     Coagulation Profile: No results for input(s): INR, PROTIME in the last 168 hours.  Cardiac Enzymes: Recent Labs  Lab 04/18/18 1215 04/18/18 2145  TROPONINI 0.03* <0.03    HbA1C: Hgb A1c MFr Bld  Date/Time Value Ref Range Status  02/13/2017 09:35 PM 5.1 4.8 - 5.6 % Final    Comment:    (NOTE) Pre diabetes:          5.7%-6.4% Diabetes:              >6.4% Glycemic control for   <7.0% adults with diabetes     CBG: No results for input(s): GLUCAP in the last 168 hours.     Critical care time: none    Roselie Awkward, MD [place] PCCM Pager: [phone] Cell: [phone] If no response, call [phone]

## 2018-04-19 NOTE — Progress Notes (Signed)
EEG completed; results pending.    

## 2018-04-19 NOTE — Progress Notes (Signed)
Initial Nutrition Assessment  DOCUMENTATION CODES:   Not applicable  INTERVENTION:  - Will order TF:  Vital High Protein @ 50 mL/hr with 30 mL Prostat once/day. This regimen provides 1300 kcal, 120 grams of protein, and 1003 mL free water.  - Free water flush to be per MD/NP given current serum Na and Cl.  NUTRITION DIAGNOSIS:   Increased nutrient needs related to wound healing as evidenced by estimated needs.  GOAL:   Patient will meet greater than or equal to 90% of their needs  MONITOR:   Vent status, TF tolerance, Weight trends, Labs, Skin  REASON FOR ASSESSMENT:   Ventilator, Consult Enteral/tube feeding initiation and management  ASSESSMENT:   81 year old with past medical history relevant for atrial fibrillation, HTN, CAD s/p stent in 2011, CHF, nephrolithiasis complicated by hydronephrosis s/p stent placement in 2017, CVA, seizure disorder complicated by chronic respiratory failure with ventilator dependence and PEG tube placement who was admitted from [place] for lethargy and electrolyte imbalances.  BMI indicates normal weight. Patient intubated via trach with PEG in place. Spoke with RN at bedside; she reports that there is no phone number for family in the chart (no family/visitors at bedside) and that unit Network engineer is working to get medical records from [place].   Patient was last seen by a [facility] RD on 02/21/17 at which time she was ordered Vital 1.5 @ 40 mL/hr with 30 mL Prostat BID. Per review of medications list in H&P, patient was receiving a multivitamin and 30 mL Prostat TID PTA, but no TF formula listed. Order currently in place for 200 mL free water TID (600 mL/day).  Per Dr. Harvel Quale note this AM: acute metabolic encephalopathy with head CT unremarkable, at baseline she was able to use PMV to communicate and begin working on swallowing with SLP, plan for MRI brain if she does not improve with treatment of metabolic abnormalities, possible  PNA   Patient is currently intubated on ventilator support MV: 7.3 L/min Temp (24hrs), Avg:98 F (36.7 C), Min:97.3 F (36.3 C), Max:99.1 F (37.3 C) Propofol: none BP: 106/71 and MAP: 81  Medications reviewed. Labs reviewed; Na: 151 mmol/L, Cl: 116 mmol/L, BUN: 40 mg/dL, creatinine: 0.32 mg/dL. IVF; D5 @ 75 mL/hr (306 kcal).     NUTRITION - FOCUSED PHYSICAL EXAM:  Completed; no muscle and no fat wasting.   Diet Order:   Diet Order            Diet NPO time specified  Diet effective now              EDUCATION NEEDS:   Not appropriate for education at this time  Skin:  Skin Assessment: Skin Integrity Issues: Skin Integrity Issues:: Stage IV, Unstageable Stage IV: vertebral column Unstageable: full thickness to sacrum  Last BM:  12/11  Height:   Ht Readings from Last 1 Encounters:  04/18/18 5\' 4"  (1.626 m)    Weight:   Wt Readings from Last 1 Encounters:  04/19/18 63.5 kg    Ideal Body Weight:  54.54 kg  BMI:  Body mass index is 24.03 kg/m.  Estimated Nutritional Needs:   Kcal:  1296 kcal  Protein:  108-127 grams (1.7-2 grams/kg)  Fluid:  >/= 2 L/day     Jarome Matin, MS, RD, LDN, [facility] Inpatient Clinical Dietitian Pager # [phone] After hours/weekend pager # [phone]

## 2018-04-19 NOTE — Consult Note (Signed)
[facility] Nurse wound consult note Reason for Consult: Unstageable pressure injury to sacrococcygeal area.  Unstageable pressure injury to lumbar spine, all present on admission. PEG tube in place.  Current albumin 2.2.  Due to co morbidities, healing may not be the goal with these wounds.  Wound type:full thickness pressure injury Pressure Injury POA: Yes Measurement: sacrum:  3 cm x 1.5 cm x 1 cm, tunnels at 2 o'clock Lumbar spine:  2 cm x 2 cm x 0.3 cm  Both wounds with 25% adherent fibrin slough to wound bed Wound bed: 25% fibrin slough and 68% pale pink slick nongranulating tissue.  Drainage (amount, consistency, odor) minimal serosanguinous   No odor Periwound: Dry skin Dressing procedure/placement/frequency: Cleanse wounds with NS and pat dry.  Apply Santyl to wound bed.  Please pack tunneling in sacral wound with packing strip.  Cover with NS moist 2x2.  Cover with ABD pad and tape. Change daily.  Will not follow at this time.  Please re-consult if needed.  Domenic Moras MSN, RN, FNP-BC CWON Wound, Ostomy, Continence Nurse Pager [phone]

## 2018-04-19 NOTE — Progress Notes (Signed)
Dr Herbert Moors notified of urine output  Today.

## 2018-04-19 NOTE — Progress Notes (Signed)
PROGRESS NOTE    Melissa Cochran  MWU:[id number] DOB: 1936/01/26 DOA: 04/18/2018 PCP: [name], MD   Brief Narrative:  82 year old with past medical history relevant for chronic atrial fibrillation on apixaban, hypertension, coronary artery disease status post stent in 7253, chronic systolic heart failure with EF of 45 to 50% by echo on 06/28/2017, nephrolithiasis complicated by hydronephrosis status post stent placement in 2017, history of bilateral hydronephrosis, CVA, seizure disorder complicated by chronic respiratory failure with ventilator dependence and PEG tube placement who was admitted from [facility] for lethargy and electrolyte imbalances.   Assessment & Plan:   Principal Problem:   Acute encephalopathy Active Problems:   Essential (primary) hypertension   History of cardiac catheterization   Hyperlipidemia   DVT (deep venous thrombosis) (HCC)   Seizures (HCC)   A-fib (HCC)   CVA (cerebral vascular accident) ([place])   Chronic respiratory failure ([place])   #) Acute metabolic encephalopathy: Patient was admitted from [facility] with decreased responsiveness.  Head CT on admission was unremarkable.  There was possibly a concern that this could be nonconvulsive status epilepticus.  Of note patient was being treated with IV vancomycin and meropenem at [facility] and there was some concern that this could increase clearance of valproic acid.  Apparently at her baseline she had been weaned to a Passy-Muir valve and was speaking and working on swallowing.  Currently she is completely nonresponsive.  She is mildly hypernatremic however her procalcitonin is undetectable and her urine and blood cultures are thus far negative.  Additionally in her complicated medical history she has required stent placement for nephrolithiasis and hydronephrosis. -Pending EEG on 04/19/2018 -Neurology following, patient recommendations - B12 and folate replete, RPR pending - Urine cultures and  blood cultures from 04/18/2018 no growth to date - Valproic acid undetectable, levetiracetam level pending - Continue antiepileptics per below -We will plan for MRI brain if patient does not improve with treatment of metabolic abnormalities - Treatment of hypernatremia per below  #) Possible pneumonia: Patient apparently was being treated for pneumonia at [place].  Currently with low procalcitonin and no other signs or symptoms of infection will hold on antibiotics. -Follow-up blood cultures on 04/18/2018 -We will consider CT imaging as patient is noted to have opacities on chest x-ray that this may just be residual pneumonia  #) Seizure disorder: -Hold levetiracetam thousand milligrams twice daily by tube -Hold valproic acid 250 mg twice daily by tube - Continue levetiracetam thousand milligrams IV twice daily -Continue IV valproic acid 250 mg twice daily  #) Chronic respiratory failure status post tracheostomy: -PCCM following, patient recommendations  #) Hypernatremia with PEG tube dependence: -Continue free water flushes 200 mL's every 8 hours -Continue D5 water at 75 mL's an hour for 1 day - We will consult nutrition for diet  #) Chronic atrial fibrillation complicated by CVA: -Continue beta-blocker -Continue apixaban 2.5 mg twice daily  #) Hypertension/hyperlipidemia/coronary artery disease status post stent: -Continue aspirin 81 mg -Continue carvedilol 6.25 mg twice daily  #) Nephrolithiasis/hydronephrosis status post stents: Currently her creatinine is normal. -Low threshold to repeat CT abdomen   #) Chronic systolic heart failure: Last echo in February showed EF of 40 to 45% -Hold furosemide 40 mg daily via tube  Fluids: D5 W Electrolytes: Per above Nutrition: Per above  Prophylaxis: Apixaban  Disposition: Pending return to baseline mental status  Full code  Consultants:   PCCM  Neurology  Procedures:   04/19/2018 EEG: Pending  Antimicrobials:  None   Subjective: This morning patient cannot respond to any questions.  She does not appear to be uncomfortable.  Objective: Vitals:   04/19/18 0800 04/19/18 0810 04/19/18 0900 04/19/18 1000  BP: 117/60  115/61 106/71  Pulse: 85  82 81  Resp: (!) 27  (!) 30 16  Temp:  97.9 F (36.6 C)    TempSrc:  Axillary    SpO2: 97%  96% 97%  Weight:      Height:        Intake/Output Summary (Last 24 hours) at 04/19/2018 1029 Last data filed at 04/19/2018 1000 Gross per 24 hour  Intake 3123.74 ml  Output 445 ml  Net 2678.74 ml   Filed Weights   04/18/18 1810 04/19/18 0329  Weight: 63.5 kg 63.5 kg    Examination:  General exam: No acute distress Respiratory system: Anterior lung sounds with scattered rhonchi Cardiovascular system: Distant heart sounds, regular rate and rhythm, no murmurs Gastrointestinal system: Soft, nondistended, no rebound or guarding, plus bowel sounds Central nervous system: Not alert or oriented, unable to assess neurological exam is not compliant but appears to have some withdrawal to noxious stimuli Extremities: Lower extremity edema Skin: Tracheostomy site is clean dry and intact, PEG tube site is clean dry and intact Psychiatry: Unable to assess due to medical condition    Data Reviewed: I have personally reviewed following labs and imaging studies  CBC: Recent Labs  Lab 04/18/18 1215 04/19/18 0415  WBC 9.1 7.8  NEUTROABS 6.8  --   HGB 8.7* 8.9*  HCT 34.1* 33.0*  MCV 104.0* 100.0  PLT 186 409   Basic Metabolic Panel: Recent Labs  Lab 04/18/18 1215 04/18/18 2145 04/19/18 0415  NA 151* 154* 151*  K 4.3 3.9 3.9  CL 113* 120* 116*  CO2 31 25 27   GLUCOSE 111* 103* 103*  BUN 46* 37* 40*  CREATININE 0.39* 0.32* 0.32*  CALCIUM 9.6 8.6* 9.1  MG  --  2.3 2.4   GFR: Estimated Creatinine Clearance: 48.4 mL/min (A) (by C-G formula based on SCr of 0.32 mg/dL (L)). Liver Function Tests: Recent Labs  Lab 04/18/18 1215 04/19/18 0415   AST 20 16  ALT 29 22  ALKPHOS 53 45  BILITOT 0.9 1.1  PROT 6.1* 5.5*  ALBUMIN 2.7* 2.2*   No results for input(s): LIPASE, AMYLASE in the last 168 hours. Recent Labs  Lab 04/18/18 2145  AMMONIA 25   Coagulation Profile: No results for input(s): INR, PROTIME in the last 168 hours. Cardiac Enzymes: Recent Labs  Lab 04/18/18 1215 04/18/18 2145  TROPONINI 0.03* <0.03   BNP (last 3 results) No results for input(s): PROBNP in the last 8760 hours. HbA1C: No results for input(s): HGBA1C in the last 72 hours. CBG: No results for input(s): GLUCAP in the last 168 hours. Lipid Profile: No results for input(s): CHOL, HDL, LDLCALC, TRIG, CHOLHDL, LDLDIRECT in the last 72 hours. Thyroid Function Tests: Recent Labs    04/18/18 2145  TSH 3.254   Anemia Panel: Recent Labs    04/18/18 2145  VITAMINB12 499  FOLATE 32.9   Sepsis Labs: Recent Labs  Lab 04/18/18 1222 04/18/18 2145 04/19/18 0415  PROCALCITON  --  <0.10 <0.10  LATICACIDVEN 1.46  --   --     Recent Results (from the past 240 hour(s))  Blood Culture (routine x 2)     Status: None (Preliminary result)   Collection Time: 04/18/18 12:15 PM  Result Value Ref Range Status   Specimen  Description BLOOD PICC LINE  Final   Special Requests   Final    BOTTLES DRAWN AEROBIC AND ANAEROBIC Blood Culture adequate volume Performed at [facility], [place] [address], [place], [place] [address]    Culture PENDING  Incomplete   Report Status PENDING  Incomplete         Radiology Studies: Ct Head Wo Contrast  Result Date: 04/18/2018 CLINICAL DATA:  Encephalopathy EXAM: CT HEAD WITHOUT CONTRAST TECHNIQUE: Contiguous axial images were obtained from the base of the skull through the vertex without intravenous contrast. COMPARISON:  CT 08/24/2017, MRI 06/28/2017 FINDINGS: Brain: Evaluation is limited by patient positioning. No acute territorial infarction, hemorrhage or new mass is visualized. 2 cm right cranial  convexity meningioma, no change. Moderate-to-marked atrophy. Small focus of encephalomalacia in the right occipital lobe. Moderate small vessel ischemic changes of the white matter. Stable ventricle size. Vascular: No hyperdense vessels.  Carotid vascular calcification. Skull: No fracture Sinuses/Orbits: Mucosal disease in the sphenoid and ethmoid sinuses. Other: None IMPRESSION: 1. No definite CT evidence for acute intracranial abnormality. 2. Atrophy and small vessel ischemic changes of the white matter. 3. Stable 2 cm right cranial convexity meningioma. Electronically Signed   By: Donavan Foil M.D.   On: 04/18/2018 18:16   Portable Chest Xray  Result Date: 04/19/2018 CLINICAL DATA:  Acute respiratory failure with hypoxia. EXAM: PORTABLE CHEST 1 VIEW COMPARISON:  Radiograph of April 18, 2018. FINDINGS: Tracheostomy tube is in grossly good position. Right-sided PICC line is unchanged in position. Stable cardiomediastinal silhouette. No pneumothorax is noted. Increased bibasilar subsegmental atelectasis is noted with minimal pleural effusions. Bony thorax is unremarkable. IMPRESSION: Stable support apparatus. Increased bibasilar opacities as described above. Electronically Signed   By: Marijo Conception, M.D.   On: 04/19/2018 07:41   Dg Chest Port 1 View  Result Date: 04/18/2018 CLINICAL DATA:  Electrolyte imbalance, increased lethargy. The patient is a tracheostomy patient and is undergoing ventilation. History of CHF and COPD. EXAM: PORTABLE CHEST 1 VIEW COMPARISON:  Portable chest x-ray of August 24, 2017 FINDINGS: The lungs are adequately inflated. There is no focal infiltrate. There is no pleural effusion or pneumothorax. The cardiac silhouette is mildly enlarged. The pulmonary vascularity is normal. The endotracheal tube tip projects approximately 3 cm above the carina. The right-sided PICC line tip projects over the midportion of the SVC. There is calcification in the wall of the aortic arch. There  is calcification of the mitral valvular annulus. IMPRESSION: No acute pneumonia. Stable mild cardiomegaly without pulmonary edema. The endotracheal tube placed via the tracheostomy is in reasonable position. Thoracic aortic atherosclerosis. Electronically Signed   By: David  Martinique M.D.   On: 04/18/2018 12:18        Scheduled Meds: . free water  200 mL Per Tube Q8H  . heparin injection (subcutaneous)  5,000 Units Subcutaneous Q8H  . pantoprazole sodium  40 mg Per Tube Daily  . sodium chloride flush  3 mL Intravenous Q12H   Continuous Infusions: . dextrose 75 mL/hr at 04/19/18 1000  . levETIRAcetam Stopped (04/19/18 2458)  . valproate sodium Stopped (04/18/18 2219)     LOS: 1 day    Time spent: [place], MD [facility]  If 7PM-7AM, please contact night-coverage [email or web address] Password [facility] 04/19/2018, 10:29 AM

## 2018-04-20 ENCOUNTER — Inpatient Hospital Stay (HOSPITAL_COMMUNITY): Payer: Medicare Other

## 2018-04-20 LAB — COMPREHENSIVE METABOLIC PANEL
ALT: 21 U/L (ref 0–44)
AST: 16 U/L (ref 15–41)
Albumin: 2 g/dL — ABNORMAL LOW (ref 3.5–5.0)
Alkaline Phosphatase: 43 U/L (ref 38–126)
BUN: 36 mg/dL — ABNORMAL HIGH (ref 8–23)
Calcium: 8.7 mg/dL — ABNORMAL LOW (ref 8.9–10.3)
Chloride: 108 mmol/L (ref 98–111)
GFR calc Af Amer: >60 mL/min (ref >60)
GFR calc non Af Amer: >60 mL/min (ref >60)
Glucose, Bld: 122 mg/dL — ABNORMAL HIGH (ref 70–99)
Potassium: 3.3 mmol/L — ABNORMAL LOW (ref 3.5–5.1)
Sodium: 141 mmol/L (ref 135–145)
Total Bilirubin: 0.9 mg/dL (ref 0.3–1.2)
Total Protein: 5 g/dL — ABNORMAL LOW (ref 6.5–8.1)

## 2018-04-20 LAB — CBC
HCT: 29.8 % — ABNORMAL LOW (ref 36.0–46.0)
Hemoglobin: 8.2 g/dL — ABNORMAL LOW (ref 12.0–15.0)
MCH: 26.9 pg (ref 26.0–34.0)
MCHC: 27.5 g/dL — ABNORMAL LOW (ref 30.0–36.0)
MCV: 97.7 fL (ref 80.0–100.0)
Platelets: 151 10*3/uL (ref 150–400)
RBC: 3.05 MIL/uL — ABNORMAL LOW (ref 3.87–5.11)
RDW: 20.3 % — ABNORMAL HIGH (ref 11.5–15.5)
WBC: 6.4 K/uL (ref 4.0–10.5)
nRBC: 0.3 % — ABNORMAL HIGH (ref 0.0–0.2)

## 2018-04-20 LAB — BLOOD CULTURE ID PANEL (REFLEXED)
Acinetobacter baumannii: NOT DETECTED
CANDIDA TROPICALIS: NOT DETECTED
Candida albicans: NOT DETECTED
Candida glabrata: NOT DETECTED
Candida krusei: NOT DETECTED
Candida parapsilosis: NOT DETECTED
ESCHERICHIA COLI: NOT DETECTED
Enterobacter cloacae complex: NOT DETECTED
Enterobacteriaceae species: NOT DETECTED
Enterococcus species: NOT DETECTED
Haemophilus influenzae: NOT DETECTED
Klebsiella oxytoca: NOT DETECTED
Klebsiella pneumoniae: NOT DETECTED
Listeria monocytogenes: NOT DETECTED
Neisseria meningitidis: NOT DETECTED
Proteus species: NOT DETECTED
Pseudomonas aeruginosa: NOT DETECTED
Serratia marcescens: NOT DETECTED
Staphylococcus aureus (BCID): NOT DETECTED
Staphylococcus species: NOT DETECTED
Streptococcus agalactiae: NOT DETECTED
Streptococcus pneumoniae: NOT DETECTED
Streptococcus pyogenes: NOT DETECTED
Streptococcus species: NOT DETECTED

## 2018-04-20 LAB — GLUCOSE, CAPILLARY
Glucose-Capillary: 102 mg/dL — ABNORMAL HIGH (ref 70–99)
Glucose-Capillary: 103 mg/dL — ABNORMAL HIGH (ref 70–99)
Glucose-Capillary: 109 mg/dL — ABNORMAL HIGH (ref 70–99)
Glucose-Capillary: 112 mg/dL — ABNORMAL HIGH (ref 70–99)
Glucose-Capillary: 121 mg/dL — ABNORMAL HIGH (ref 70–99)
Glucose-Capillary: 137 mg/dL — ABNORMAL HIGH (ref 70–99)

## 2018-04-20 LAB — PHOSPHORUS
Phosphorus: 2.4 mg/dL — ABNORMAL LOW (ref 2.5–4.6)
Phosphorus: 2.4 mg/dL — ABNORMAL LOW (ref 2.5–4.6)

## 2018-04-20 LAB — MRSA PCR SCREENING: MRSA by PCR: POSITIVE — AB

## 2018-04-20 LAB — PROCALCITONIN: Procalcitonin: <0.1 ng/mL

## 2018-04-20 LAB — COMPREHENSIVE METABOLIC PANEL WITH GFR
Anion gap: 6 (ref 5–15)
CO2: 27 mmol/L (ref 22–32)
Creatinine, Ser: 0.33 mg/dL — ABNORMAL LOW (ref 0.44–1.00)

## 2018-04-20 LAB — MAGNESIUM: Magnesium: 2.1 mg/dL (ref 1.7–2.4)

## 2018-04-20 MED ORDER — POTASSIUM CHLORIDE 20 MEQ PO PACK
40.0000 meq | PACK | Freq: Once | ORAL | Status: AC
  Administered 2018-04-20: 40 meq via ORAL
  Filled 2018-04-20: qty 2

## 2018-04-20 MED ORDER — CHLORHEXIDINE GLUCONATE CLOTH 2 % EX PADS
6.0000 | MEDICATED_PAD | Freq: Every day | CUTANEOUS | Status: DC
  Administered 2018-04-21 – 2018-05-02 (×9): 6 via TOPICAL

## 2018-04-20 MED ORDER — SODIUM CHLORIDE 0.9% FLUSH
10.0000 mL | INTRAVENOUS | Status: DC | PRN
  Administered 2018-04-30: 10 mL
  Filled 2018-04-20: qty 40

## 2018-04-20 MED ORDER — MUPIROCIN 2 % EX OINT
1.0000 "application " | TOPICAL_OINTMENT | Freq: Two times a day (BID) | CUTANEOUS | Status: AC
  Administered 2018-04-20 – 2018-04-25 (×10): 1 via NASAL
  Filled 2018-04-20: qty 22

## 2018-04-20 MED ORDER — SODIUM CHLORIDE 0.9% FLUSH
10.0000 mL | Freq: Two times a day (BID) | INTRAVENOUS | Status: DC
  Administered 2018-04-20 – 2018-04-22 (×5): 10 mL
  Administered 2018-04-22: 20 mL
  Administered 2018-04-23 (×2): 10 mL
  Administered 2018-04-24: 40 mL
  Administered 2018-04-24: 10 mL
  Administered 2018-04-25: 40 mL
  Administered 2018-04-25 – 2018-05-02 (×12): 10 mL

## 2018-04-20 NOTE — Progress Notes (Signed)
PROGRESS NOTE    Melissa Cochran  OEH:[id number] DOB: 1936/04/27 DOA: 04/18/2018 PCP: [name], MD   Brief Narrative:  82 year old with past medical history relevant for chronic atrial fibrillation on apixaban, hypertension, coronary artery disease status post stent in 0370, chronic systolic heart failure with EF of 45 to 50% by echo on 06/28/2017, nephrolithiasis complicated by hydronephrosis status post stent placement in 2017, history of bilateral hydronephrosis, CVA, seizure disorder complicated by chronic respiratory failure with ventilator dependence and PEG tube placement who was admitted from [facility] for lethargy and electrolyte imbalances.   Assessment & Plan:   Principal Problem:   Acute encephalopathy Active Problems:   Essential (primary) hypertension   History of cardiac catheterization   Hyperlipidemia   DVT (deep venous thrombosis) (HCC)   Seizures (HCC)   A-fib (HCC)   CVA (cerebral vascular accident) ([employer])   Chronic respiratory failure ([place])   #) Acute metabolic encephalopathy: Despite correction of her electrolytes patient continues to be not at her baseline.  At this time there is not appear to be a metabolic cause that is clearly shown itself.  There is no evidence of infection. - EEG on 04/19/2018 shows occasional sharp activity over right frontotemporal region which neurology is attributing to a meningioma -Neurology following, appreciate recommendations - B12 and folate replete, RPR negative - Urine cultures and blood cultures from 04/18/2018 no growth to date -Respiratory culture from 04/19/2018 pending growth, shows abundant white blood cells and GNR's - Valproic acid undetectable, levetiracetam level pending - Continue antiepileptics per below -We will order MRI brain today  #) Possible pneumonia: Patient apparently was being treated for pneumonia at [place].  Currently with low procalcitonin and no other signs or symptoms of infection  will hold on antibiotics. - blood cultures on 04/18/2018 no growth to date -Consider CT chest --Respiratory culture from 04/19/2018 pending growth, shows abundant white blood cells and GNR's  #) Seizure disorder: -Hold levetiracetam thousand milligrams twice daily by tube -Hold valproic acid 250 mg twice daily by tube - Continue levetiracetam thousand milligrams IV twice daily -Continue IV valproic acid 250 mg twice daily  #) Chronic respiratory failure status post tracheostomy: -PCCM following, patient recommendations  #) Hypernatremia with PEG tube dependence: Resolved with free water flushes and D5W -Continue free water flushes 200 mL's every 8 hours -Discontinue D5 water at 75 mL's an hour for 1 day - Vital 1.5 @ 40 mL/hr with 30 mL Prostat BID  #) Chronic atrial fibrillation complicated by CVA: -Continue beta-blocker -Continue apixaban 2.5 mg twice daily  #) Hypertension/hyperlipidemia/coronary artery disease status post stent: -Continue aspirin 81 mg -Continue carvedilol 6.25 mg twice daily  #) Anemia: This appears to be stable at this time.  Suspect most likely iron deficiency anemia and anemia of chronic disease. -We will consider work-up  #) Nephrolithiasis/hydronephrosis status post stents: Currently her creatinine is normal. -Low threshold to repeat CT abdomen   #) Chronic systolic heart failure: Last echo in February showed EF of 40 to 45% -Hold furosemide 40 mg daily via tube  Fluids: Free water flushes Electrolytes: Per above Nutrition: Per above  Prophylaxis: Apixaban  Disposition: Pending return to baseline mental status  Full code  Consultants:   PCCM  Neurology  Procedures:   04/19/2018 WUG:QBVQ is an abnormal electroencephalogram due to occasional sharp activity over the right frontotemporal region. This is suggestive of a focal disturbance over right hemisphere with epileptogenic potential.   Antimicrobials:   None   Subjective: This  morning patient cannot respond to any questions.  She does not appear to be uncomfortable.  Objective: Vitals:   04/20/18 0500 04/20/18 0600 04/20/18 0718 04/20/18 0800  BP: (!) 92/50 (!) 92/49 (!) 113/57   Pulse: 75 76 75   Resp: 15 15 (!) 31   Temp:    (!) 97.5 F (36.4 C)  TempSrc:    Axillary  SpO2: 99% 98%    Weight:      Height:        Intake/Output Summary (Last 24 hours) at 04/20/2018 2836 Last data filed at 04/20/2018 0600 Gross per 24 hour  Intake 1125.49 ml  Output 570 ml  Net 555.49 ml   Filed Weights   04/18/18 1810 04/19/18 0329  Weight: 63.5 kg 63.5 kg    Examination:  General exam: No acute distress Respiratory system: Anterior lung sounds with scattered rhonchi Cardiovascular system: Distant heart sounds, regular rate and rhythm, no murmurs Gastrointestinal system: Soft, nondistended, no rebound or guarding, plus bowel sounds Central nervous system: Not alert or oriented, unable to assess neurological exam is not compliant but appears to have some withdrawal to noxious stimuli Extremities: Lower extremity edema Skin: Tracheostomy site is clean dry and intact, PEG tube site is clean dry and intact Psychiatry: Unable to assess due to medical condition    Data Reviewed: I have personally reviewed following labs and imaging studies  CBC: Recent Labs  Lab 04/18/18 1215 04/19/18 0415 04/20/18 0607  WBC 9.1 7.8 6.4  NEUTROABS 6.8  --   --   HGB 8.7* 8.9* 8.2*  HCT 34.1* 33.0* 29.8*  MCV 104.0* 100.0 97.7  PLT 186 172 629   Basic Metabolic Panel: Recent Labs  Lab 04/18/18 1215 04/18/18 2145 04/19/18 0415 04/19/18 1350 04/19/18 1654 04/20/18 0607  NA 151* 154* 151*  --   --  141  K 4.3 3.9 3.9  --   --  3.3*  CL 113* 120* 116*  --   --  108  CO2 _0 --   --  27  GLUCOSE 111* 103* 103*  --   --  122*  BUN 46* 37* 40*  --   --  36*  CREATININE 0.39* 0.32* 0.32*  --   --  0.33*  CALCIUM 9.6 8.6* 9.1  --   --  8.7*  MG  --  2.3 2.4   --   --  2.1  PHOS  --   --   --  2.4* 2.4* 2.4*   GFR: Estimated Creatinine Clearance: 48.4 mL/min (A) (by C-G formula based on SCr of 0.33 mg/dL (L)). Liver Function Tests: Recent Labs  Lab 04/18/18 1215 04/19/18 0415 04/20/18 0607  AST _1 ALT _2 ALKPHOS 53 45 43  BILITOT 0.9 1.1 0.9  PROT 6.1* 5.5* 5.0*  ALBUMIN 2.7* 2.2* 2.0*   No results for input(s): LIPASE, AMYLASE in the last 168 hours. Recent Labs  Lab 04/18/18 2145  AMMONIA 25   Coagulation Profile: No results for input(s): INR, PROTIME in the last 168 hours. Cardiac Enzymes: Recent Labs  Lab 04/18/18 1215 04/18/18 2145  TROPONINI 0.03* <0.03   BNP (last 3 results) No results for input(s): PROBNP in the last 8760 hours. HbA1C: No results for input(s): HGBA1C in the last 72 hours. CBG: Recent Labs  Lab 04/19/18 1358 04/19/18 1519 04/19/18 1948 04/19/18 2329 04/20/18 0312  GLUCAP 104* 107* 115* 121* 137*   Lipid Profile: No results for  input(s): CHOL, HDL, LDLCALC, TRIG, CHOLHDL, LDLDIRECT in the last 72 hours. Thyroid Function Tests: Recent Labs    04/18/18 2145  TSH 3.254   Anemia Panel: Recent Labs    04/18/18 2145  VITAMINB12 499  FOLATE 32.9   Sepsis Labs: Recent Labs  Lab 04/18/18 1222 04/18/18 2145 04/19/18 0415 04/20/18 0607  PROCALCITON  --  <0.10 <0.10 <0.10  LATICACIDVEN 1.46  --   --   --     Recent Results (from the past 240 hour(s))  Blood Culture (routine x 2)     Status: None (Preliminary result)   Collection Time: 04/18/18 12:15 PM  Result Value Ref Range Status   Specimen Description BLOOD PICC LINE  Final   Special Requests   Final    BOTTLES DRAWN AEROBIC AND ANAEROBIC Blood Culture adequate volume   Culture   Final    NO GROWTH < 24 HOURS Performed at [facility], [place] [address], [place], [place] [address]    Report Status PENDING  Incomplete  Blood Culture (routine x 2)     Status: None (Preliminary result)   Collection Time:  04/18/18  3:05 PM  Result Value Ref Range Status   Specimen Description   Final    BLOOD RIGHT HAND Performed at [facility] [address] [place], [place] [address]    Special Requests   Final    BOTTLES DRAWN AEROBIC ONLY Blood Culture results may not be optimal due to an inadequate volume of blood received in culture bottles Performed at [facility] [address], [place], [place] [address]    Culture  Setup Time   Final    GRAM POSITIVE COCCI AEROBIC BOTTLE ONLY Organism ID to follow CRITICAL RESULT CALLED TO, READ BACK BY AND VERIFIED WITH: L POINDEXTER PHARMD [id number] 04/20/18 A BROWNING    Culture   Final    TOO YOUNG TO READ Performed at [facility], [place] [address], [place], [place] [address]    Report Status PENDING  Incomplete  Blood Culture ID Panel (Reflexed)     Status: None   Collection Time: 04/18/18  3:05 PM  Result Value Ref Range Status   Enterococcus species NOT DETECTED NOT DETECTED Final   Listeria monocytogenes NOT DETECTED NOT DETECTED Final   Staphylococcus species NOT DETECTED NOT DETECTED Final   Staphylococcus aureus (BCID) NOT DETECTED NOT DETECTED Final   Streptococcus species NOT DETECTED NOT DETECTED Final   Streptococcus agalactiae NOT DETECTED NOT DETECTED Final   Streptococcus pneumoniae NOT DETECTED NOT DETECTED Final   Streptococcus pyogenes NOT DETECTED NOT DETECTED Final   Acinetobacter baumannii NOT DETECTED NOT DETECTED Final   Enterobacteriaceae species NOT DETECTED NOT DETECTED Final   Enterobacter cloacae complex NOT DETECTED NOT DETECTED Final   Escherichia coli NOT DETECTED NOT DETECTED Final   Klebsiella oxytoca NOT DETECTED NOT DETECTED Final   Klebsiella pneumoniae NOT DETECTED NOT DETECTED Final   Proteus species NOT DETECTED NOT DETECTED Final   Serratia marcescens NOT DETECTED NOT DETECTED Final   Haemophilus influenzae NOT DETECTED NOT DETECTED Final   Neisseria meningitidis  NOT DETECTED NOT DETECTED Final   Pseudomonas aeruginosa NOT DETECTED NOT DETECTED Final   Candida albicans NOT DETECTED NOT DETECTED Final   Candida glabrata NOT DETECTED NOT DETECTED Final   Candida krusei NOT DETECTED NOT DETECTED Final   Candida parapsilosis NOT DETECTED NOT DETECTED Final   Candida tropicalis NOT DETECTED NOT DETECTED Final  Comment: Performed at [facility], [place] [address] [place], [place] [address]  Urine culture     Status: None   Collection Time: 04/18/18  3:34 PM  Result Value Ref Range Status   Specimen Description   Final    URINE, RANDOM Performed at [facility] [address], [place], [place] [address]    Special Requests   Final    NONE Performed at [facility], [place] [address], [place], [place] [address]    Culture   Final    NO GROWTH Performed at [facility], [place] [address] [place], [place] [address]    Report Status 04/19/2018 FINAL  Final  Culture, respiratory (non-expectorated)     Status: None (Preliminary result)   Collection Time: 04/19/18  3:48 PM  Result Value Ref Range Status   Specimen Description   Final    TRACHEAL ASPIRATE Performed at [facility] [address], [place], [place] [address]    Special Requests   Final    NONE Performed at [facility] [place] [address], [place], [place] [address]    Gram Stain   Final    ABUNDANT WBC PRESENT,BOTH PMN AND MONONUCLEAR ABUNDANT GRAM NEGATIVE RODS Performed at [facility], [place] [address], [place], [place] [address]    Culture PENDING  Incomplete   Report Status PENDING  Incomplete         Radiology Studies: Ct Head Wo Contrast  Result Date: 04/18/2018 CLINICAL DATA:  Encephalopathy EXAM: CT HEAD WITHOUT CONTRAST TECHNIQUE: Contiguous axial images were obtained from the base of the skull through the vertex without intravenous contrast. COMPARISON:  CT 08/24/2017, MRI  06/28/2017 FINDINGS: Brain: Evaluation is limited by patient positioning. No acute territorial infarction, hemorrhage or new mass is visualized. 2 cm right cranial convexity meningioma, no change. Moderate-to-marked atrophy. Small focus of encephalomalacia in the right occipital lobe. Moderate small vessel ischemic changes of the white matter. Stable ventricle size. Vascular: No hyperdense vessels.  Carotid vascular calcification. Skull: No fracture Sinuses/Orbits: Mucosal disease in the sphenoid and ethmoid sinuses. Other: None IMPRESSION: 1. No definite CT evidence for acute intracranial abnormality. 2. Atrophy and small vessel ischemic changes of the white matter. 3. Stable 2 cm right cranial convexity meningioma. Electronically Signed   By: Donavan Foil M.D.   On: 04/18/2018 18:16   Portable Chest Xray  Result Date: 04/19/2018 CLINICAL DATA:  Acute respiratory failure with hypoxia. EXAM: PORTABLE CHEST 1 VIEW COMPARISON:  Radiograph of April 18, 2018. FINDINGS: Tracheostomy tube is in grossly good position. Right-sided PICC line is unchanged in position. Stable cardiomediastinal silhouette. No pneumothorax is noted. Increased bibasilar subsegmental atelectasis is noted with minimal pleural effusions. Bony thorax is unremarkable. IMPRESSION: Stable support apparatus. Increased bibasilar opacities as described above. Electronically Signed   By: Marijo Conception, M.D.   On: 04/19/2018 07:41   Dg Chest Port 1 View  Result Date: 04/18/2018 CLINICAL DATA:  Electrolyte imbalance, increased lethargy. The patient is a tracheostomy patient and is undergoing ventilation. History of CHF and COPD. EXAM: PORTABLE CHEST 1 VIEW COMPARISON:  Portable chest x-ray of August 24, 2017 FINDINGS: The lungs are adequately inflated. There is no focal infiltrate. There is no pleural effusion or pneumothorax. The cardiac silhouette is mildly enlarged. The pulmonary vascularity is normal. The endotracheal tube tip projects  approximately 3 cm above the carina. The right-sided PICC line tip projects over the midportion of the  SVC. There is calcification in the wall of the aortic arch. There is calcification of the mitral valvular annulus. IMPRESSION: No acute pneumonia. Stable mild cardiomegaly without pulmonary edema. The endotracheal tube placed via the tracheostomy is in reasonable position. Thoracic aortic atherosclerosis. Electronically Signed   By: David  Martinique M.D.   On: 04/18/2018 12:18        Scheduled Meds: . apixaban  2.5 mg Per Tube BID  . aspirin  81 mg Oral Daily  . carvedilol  6.25 mg Oral BID WC  . chlorhexidine gluconate (MEDLINE KIT)  15 mL Mouth Rinse BID  . Chlorhexidine Gluconate Cloth  6 each Topical Daily  . collagenase   Topical Daily  . famotidine  20 mg Oral QHS  . feeding supplement (PRO-STAT SUGAR FREE 64)  30 mL Per Tube Daily  . feeding supplement (VITAL HIGH PROTEIN)  1,000 mL Per Tube Q24H  . free water  200 mL Per Tube Q8H  . mouth rinse  15 mL Mouth Rinse 10 times per day  . pantoprazole sodium  40 mg Per Tube Daily  . sodium chloride flush  10-40 mL Intracatheter Q12H  . sodium chloride flush  3 mL Intravenous Q12H   Continuous Infusions: . levETIRAcetam 400 mL/hr at 04/20/18 0600  . valproate sodium 250 mg (04/20/18 0931)     LOS: 2 days    Time spent: [place], MD [facility]  If 7PM-7AM, please contact night-coverage [email or web address] Password [facility] 04/20/2018, 9:42 AM

## 2018-04-20 NOTE — Care Management Note (Signed)
Case Management Note  Patient Details  Name: Melissa Cochran MRN: [id number] Date of Birth: 09/02/1935  Subjective/Objective:                  82 year old with past medical history relevant for chronic atrial fibrillation on apixaban, hypertension, coronary artery disease status post stent in 4098, chronic systolic heart failure with EF of 45 to 50% by echo on 06/28/2017, nephrolithiasis complicated by hydronephrosis status post stent placement in 2017, history of bilateral hydronephrosis, CVA, seizure disorder complicated by chronic respiratory failure with ventilator dependence and PEG tube placement who was admitted from [facility] for lethargy and electrolyte imbalances.  Action/Plan: Will follow for progression of care and clinical status. Will follow for case management needs none present at this time.  Expected Discharge Date:  (unknown)               Expected Discharge Plan:  Long Term Acute Care (LTAC)  In-House Referral:     Discharge planning Services  CM Consult  Post Acute Care Choice:    Choice offered to:     DME Arranged:    DME Agency:     HH Arranged:    HH Agency:     Status of Service:  In process, will continue to follow  If discussed at Long Length of Stay Meetings, dates discussed:    Additional Comments:  Leeroy Cha, RN 04/20/2018, 8:19 AM

## 2018-04-20 NOTE — Progress Notes (Signed)
PHARMACY - PHYSICIAN COMMUNICATION CRITICAL VALUE ALERT - BLOOD CULTURE IDENTIFICATION (BCID)  Results for orders placed or performed during the hospital encounter of 04/18/18  Blood Culture ID Panel (Reflexed) (Collected: 04/18/2018  3:05 PM)  Result Value Ref Range   Enterococcus species NOT DETECTED NOT DETECTED   Listeria monocytogenes NOT DETECTED NOT DETECTED   Staphylococcus species NOT DETECTED NOT DETECTED   Staphylococcus aureus (BCID) NOT DETECTED NOT DETECTED   Streptococcus species NOT DETECTED NOT DETECTED   Streptococcus agalactiae NOT DETECTED NOT DETECTED   Streptococcus pneumoniae NOT DETECTED NOT DETECTED   Streptococcus pyogenes NOT DETECTED NOT DETECTED   Acinetobacter baumannii NOT DETECTED NOT DETECTED   Enterobacteriaceae species NOT DETECTED NOT DETECTED   Enterobacter cloacae complex NOT DETECTED NOT DETECTED   Escherichia coli NOT DETECTED NOT DETECTED   Klebsiella oxytoca NOT DETECTED NOT DETECTED   Klebsiella pneumoniae NOT DETECTED NOT DETECTED   Proteus species NOT DETECTED NOT DETECTED   Serratia marcescens NOT DETECTED NOT DETECTED   Haemophilus influenzae NOT DETECTED NOT DETECTED   Neisseria meningitidis NOT DETECTED NOT DETECTED   Pseudomonas aeruginosa NOT DETECTED NOT DETECTED   Candida albicans NOT DETECTED NOT DETECTED   Candida glabrata NOT DETECTED NOT DETECTED   Candida krusei NOT DETECTED NOT DETECTED   Candida parapsilosis NOT DETECTED NOT DETECTED   Candida tropicalis NOT DETECTED NOT DETECTED    Name of physician (or Provider) Contacted: Lamar Blinks, NP  Changes to prescribed antibiotics required: no changes at this time  Everette Rank, PharmD 04/20/2018  3:43 AM

## 2018-04-20 NOTE — Progress Notes (Signed)
Pt weaned about 2 1/2 hour and was placed on full support due to going to MRI. RT will tru to wean again tomorrow.

## 2018-04-21 DIAGNOSIS — I4891 Unspecified atrial fibrillation: Secondary | ICD-10-CM

## 2018-04-21 DIAGNOSIS — J9601 Acute respiratory failure with hypoxia: Secondary | ICD-10-CM

## 2018-04-21 LAB — GLUCOSE, CAPILLARY
GLUCOSE-CAPILLARY: 92 mg/dL (ref 70–99)
Glucose-Capillary: 101 mg/dL — ABNORMAL HIGH (ref 70–99)
Glucose-Capillary: 108 mg/dL — ABNORMAL HIGH (ref 70–99)
Glucose-Capillary: 108 mg/dL — ABNORMAL HIGH (ref 70–99)
Glucose-Capillary: 110 mg/dL — ABNORMAL HIGH (ref 70–99)
Glucose-Capillary: 111 mg/dL — ABNORMAL HIGH (ref 70–99)
Glucose-Capillary: 111 mg/dL — ABNORMAL HIGH (ref 70–99)

## 2018-04-21 LAB — CBC
HCT: 31.6 % — ABNORMAL LOW (ref 36.0–46.0)
Hemoglobin: 8.8 g/dL — ABNORMAL LOW (ref 12.0–15.0)
MCH: 26.5 pg (ref 26.0–34.0)
MCHC: 27.8 g/dL — ABNORMAL LOW (ref 30.0–36.0)
MCV: 95.2 fL (ref 80.0–100.0)
Platelets: 178 10*3/uL (ref 150–400)
RBC: 3.32 MIL/uL — ABNORMAL LOW (ref 3.87–5.11)
RDW: 20.3 % — ABNORMAL HIGH (ref 11.5–15.5)
WBC: 7.6 10*3/uL (ref 4.0–10.5)
nRBC: 0 % (ref 0.0–0.2)

## 2018-04-21 LAB — BASIC METABOLIC PANEL
Anion gap: 6 (ref 5–15)
CO2: 25 mmol/L (ref 22–32)
Calcium: 8.8 mg/dL — ABNORMAL LOW (ref 8.9–10.3)
Chloride: 108 mmol/L (ref 98–111)
Glucose, Bld: 109 mg/dL — ABNORMAL HIGH (ref 70–99)
Potassium: 3.5 mmol/L (ref 3.5–5.1)

## 2018-04-21 LAB — VALPROIC ACID LEVEL: Valproic Acid Lvl: 13 ug/mL — ABNORMAL LOW (ref 50.0–100.0)

## 2018-04-21 LAB — BASIC METABOLIC PANEL WITH GFR
BUN: 31 mg/dL — ABNORMAL HIGH (ref 8–23)
Creatinine, Ser: <0.3 mg/dL — ABNORMAL LOW (ref 0.44–1.00)
Sodium: 139 mmol/L (ref 135–145)

## 2018-04-21 LAB — LEVETIRACETAM LEVEL: Levetiracetam Lvl: 39.4 ug/mL (ref 10.0–40.0)

## 2018-04-21 MED ORDER — FUROSEMIDE 40 MG PO TABS
40.0000 mg | ORAL_TABLET | Freq: Every day | ORAL | Status: DC
  Administered 2018-04-21 – 2018-05-02 (×12): 40 mg via ORAL
  Filled 2018-04-21 (×12): qty 1

## 2018-04-21 NOTE — Progress Notes (Signed)
RT tried the Pt off the ventilator. The Pt's vital signs were good but her breathing seemed to be a little shallow. Pt is still on wean 10/5 . RT will continue to monitor

## 2018-04-21 NOTE — Progress Notes (Signed)
PROGRESS NOTE    Melissa Cochran  JJK:[id number] DOB: 13-Apr-1936 DOA: 04/18/2018 PCP: [name], MD   Brief Narrative:  82 year old with past medical history relevant for chronic atrial fibrillation on apixaban, hypertension, coronary artery disease status post stent in 3716, chronic systolic heart failure with EF of 45 to 50% by echo on 06/28/2017, nephrolithiasis complicated by hydronephrosis status post stent placement in 2017, history of bilateral hydronephrosis, CVA, seizure disorder complicated by chronic respiratory failure with ventilator dependence and PEG tube placement who was admitted from [facility] for lethargy and electrolyte imbalances.   Assessment & Plan:   Principal Problem:   Acute encephalopathy Active Problems:   Essential (primary) hypertension   History of cardiac catheterization   Hyperlipidemia   DVT (deep venous thrombosis) (HCC)   Seizures (HCC)   A-fib (HCC)   CVA (cerebral vascular accident) ([place])   Chronic respiratory failure ([place])   #) Acute metabolic encephalopathy: Despite correction of her electrolytes patient continues to be not at her baseline.  She continues to improve somewhat and is more awake but not alert.  She intermittently follows commands - EEG on 04/19/2018 shows occasional sharp activity over right frontotemporal region which neurology is attributing to a meningioma -Neurology following, appreciate recommendations - B12 and folate replete, RPR negative - Urine cultures and blood cultures from 04/18/2018 no growth to date -Respiratory culture from 04/19/2018 pending growth, shows abundant white blood cells and GNR's - Continue antiepileptics per below -We will order MRI brain today  #) Possible pneumonia: Patient apparently was being treated for pneumonia at [facility].  Currently with low procalcitonin and no other signs or symptoms of infection will hold on antibiotics. - blood cultures on 04/18/2018 no growth to  date -Consider CT chest --Respiratory culture from 04/19/2018 pending growth, shows abundant white blood cells and GNR's  #) Seizure disorder: -Hold levetiracetam thousand milligrams twice daily by tube -Hold valproic acid 250 mg twice daily by tube - Continue levetiracetam thousand milligrams IV twice daily -Continue IV valproic acid 250 mg twice daily - Valproic acid level low at 13, levetiracetam level pending  #) Chronic respiratory failure status post tracheostomy: -PCCM following, appreciate t recommendations  #) Hypernatremia with PEG tube dependence: Resolved with free water flushes and D5W -Continue free water flushes 200 mL's every 8 hours - Vital 1.5 @ 40 mL/hr with 30 mL Prostat BID  #) Chronic atrial fibrillation complicated by CVA: -Continue beta-blocker -Continue apixaban 2.5 mg twice daily  #) Hypertension/hyperlipidemia/coronary artery disease status post stent: -Continue aspirin 81 mg -Continue carvedilol 6.25 mg twice daily  #) Anemia: This appears to be stable at this time.  Suspect most likely iron deficiency anemia and anemia of chronic disease. -We will consider work-up  #) Nephrolithiasis/hydronephrosis status post stents: Currently her creatinine is normal. -Low threshold to repeat CT abdomen   #) Chronic systolic heart failure: Last echo in February showed EF of 40 to 45% -Restart furosemide 40 mg daily via tube  Fluids: Free water flushes Electrolytes: Per above Nutrition: Per above  Prophylaxis: Apixaban  Disposition: Pending return to baseline mental status  Full code  Consultants:   PCCM  Neurology  Procedures:   04/19/2018 RCV:ELFY is an abnormal electroencephalogram due to occasional sharp activity over the right frontotemporal region. This is suggestive of a focal disturbance over right hemisphere with epileptogenic potential.   Antimicrobials:   None   Subjective: This morning patient is opening her eyes but does not  appear to be alert.  She intermittently squeezes right hand but does not wiggle right feet.  Objective: Vitals:   04/21/18 0600 04/21/18 0633 04/21/18 0707 04/21/18 0800  BP: (!) 92/42 119/70 (!) 77/48   Pulse: 82 80 78   Resp: _0 Temp:    98.4 F (36.9 C)  TempSrc:    Oral  SpO2: 96% 98% 97%   Weight:      Height:        Intake/Output Summary (Last 24 hours) at 04/21/2018 0920 Last data filed at 04/21/2018 0601 Gross per 24 hour  Intake 402.94 ml  Output 1000 ml  Net -597.06 ml   Filed Weights   04/18/18 1810 04/19/18 0329 04/21/18 0500  Weight: 63.5 kg 63.5 kg 69.1 kg    Examination:  General exam: No acute distress Respiratory system: Anterior lung sounds with scattered rhonchi Cardiovascular system: Distant heart sounds, regular rate and rhythm, no murmurs Gastrointestinal system: Soft, nondistended, no rebound or guarding, plus bowel sounds Central nervous system: Awake but not alert or oriented, left-sided hemiparesis, intermittently squeezes right hand but does not wiggle right feet Extremities: Lower extremity edema Skin: Tracheostomy site is clean dry and intact, PEG tube site is clean dry and intact Psychiatry: Unable to assess due to medical condition    Data Reviewed: I have personally reviewed following labs and imaging studies  CBC: Recent Labs  Lab 04/18/18 1215 04/19/18 0415 04/20/18 0607 04/21/18 0342  WBC 9.1 7.8 6.4 7.6  NEUTROABS 6.8  --   --   --   HGB 8.7* 8.9* 8.2* 8.8*  HCT 34.1* 33.0* 29.8* 31.6*  MCV 104.0* 100.0 97.7 95.2  PLT 186 172 151 354   Basic Metabolic Panel: Recent Labs  Lab 04/18/18 1215 04/18/18 2145 04/19/18 0415 04/19/18 1350 04/19/18 1654 04/20/18 0607 04/20/18 1800 04/21/18 0342  NA 151* 154* 151*  --   --  141  --  139  K 4.3 3.9 3.9  --   --  3.3*  --  3.5  CL 113* 120* 116*  --   --  108  --  108  CO2 _1 --   --  27  --  25  GLUCOSE 111* 103* 103*  --   --  122*  --  109*  BUN 46* 37*  40*  --   --  36*  --  31*  CREATININE 0.39* 0.32* 0.32*  --   --  0.33*  --  <0.30*  CALCIUM 9.6 8.6* 9.1  --   --  8.7*  --  8.8*  MG  --  2.3 2.4  --   --  2.1  --   --   PHOS  --   --   --  2.4* 2.4* 2.4* 2.4*  --    GFR: CrCl cannot be calculated (This lab value cannot be used to calculate CrCl because it is not a number: <0.30). Liver Function Tests: Recent Labs  Lab 04/18/18 1215 04/19/18 0415 04/20/18 0607  AST _2 ALT _3 ALKPHOS 53 45 43  BILITOT 0.9 1.1 0.9  PROT 6.1* 5.5* 5.0*  ALBUMIN 2.7* 2.2* 2.0*   No results for input(s): LIPASE, AMYLASE in the last 168 hours. Recent Labs  Lab 04/18/18 2145  AMMONIA 25   Coagulation Profile: No results for input(s): INR, PROTIME in the last 168 hours. Cardiac Enzymes: Recent Labs  Lab 04/18/18 1215 04/18/18 2145  TROPONINI 0.03* <0.03  BNP (last 3 results) No results for input(s): PROBNP in the last 8760 hours. HbA1C: No results for input(s): HGBA1C in the last 72 hours. CBG: Recent Labs  Lab 04/20/18 1610 04/20/18 1955 04/20/18 2330 04/21/18 0357 04/21/18 0745  GLUCAP 109* 103* 112* 101* 108*   Lipid Profile: No results for input(s): CHOL, HDL, LDLCALC, TRIG, CHOLHDL, LDLDIRECT in the last 72 hours. Thyroid Function Tests: Recent Labs    04/18/18 2145  TSH 3.254   Anemia Panel: Recent Labs    04/18/18 2145  VITAMINB12 499  FOLATE 32.9   Sepsis Labs: Recent Labs  Lab 04/18/18 1222 04/18/18 2145 04/19/18 0415 04/20/18 0607  PROCALCITON  --  <0.10 <0.10 <0.10  LATICACIDVEN 1.46  --   --   --     Recent Results (from the past 240 hour(s))  Blood Culture (routine x 2)     Status: None (Preliminary result)   Collection Time: 04/18/18 12:15 PM  Result Value Ref Range Status   Specimen Description BLOOD PICC LINE  Final   Special Requests   Final    BOTTLES DRAWN AEROBIC AND ANAEROBIC Blood Culture adequate volume   Culture   Final    NO GROWTH 3 DAYS Performed at [facility], [place] [address], [place], [place] [address]    Report Status PENDING  Incomplete  Blood Culture (routine x 2)     Status: None (Preliminary result)   Collection Time: 04/18/18  3:05 PM  Result Value Ref Range Status   Specimen Description   Final    BLOOD RIGHT HAND Performed at [facility] [address] [place], [place] [address]    Special Requests   Final    BOTTLES DRAWN AEROBIC ONLY Blood Culture results may not be optimal due to an inadequate volume of blood received in culture bottles Performed at [facility] [address], [place], [place] [address]    Culture  Setup Time   Final    GRAM POSITIVE COCCI AEROBIC BOTTLE ONLY Organism ID to follow CRITICAL RESULT CALLED TO, READ BACK BY AND VERIFIED WITH: L POINDEXTER PHARMD [id number] 04/20/18 A BROWNING Performed at [facility], [place] [address] [place], [place] [address]    Culture GRAM POSITIVE COCCI  Final   Report Status PENDING  Incomplete  Blood Culture ID Panel (Reflexed)     Status: None   Collection Time: 04/18/18  3:05 PM  Result Value Ref Range Status   Enterococcus species NOT DETECTED NOT DETECTED Final   Listeria monocytogenes NOT DETECTED NOT DETECTED Final   Staphylococcus species NOT DETECTED NOT DETECTED Final   Staphylococcus aureus (BCID) NOT DETECTED NOT DETECTED Final   Streptococcus species NOT DETECTED NOT DETECTED Final   Streptococcus agalactiae NOT DETECTED NOT DETECTED Final   Streptococcus pneumoniae NOT DETECTED NOT DETECTED Final   Streptococcus pyogenes NOT DETECTED NOT DETECTED Final   Acinetobacter baumannii NOT DETECTED NOT DETECTED Final   Enterobacteriaceae species NOT DETECTED NOT DETECTED Final   Enterobacter cloacae complex NOT DETECTED NOT DETECTED Final   Escherichia coli NOT DETECTED NOT DETECTED Final   Klebsiella oxytoca NOT DETECTED NOT DETECTED Final   Klebsiella pneumoniae NOT DETECTED NOT DETECTED Final   Proteus species  NOT DETECTED NOT DETECTED Final   Serratia marcescens NOT DETECTED NOT DETECTED Final   Haemophilus influenzae NOT DETECTED NOT DETECTED Final   Neisseria meningitidis NOT DETECTED NOT DETECTED Final   Pseudomonas aeruginosa NOT DETECTED NOT DETECTED  Final   Candida albicans NOT DETECTED NOT DETECTED Final   Candida glabrata NOT DETECTED NOT DETECTED Final   Candida krusei NOT DETECTED NOT DETECTED Final   Candida parapsilosis NOT DETECTED NOT DETECTED Final   Candida tropicalis NOT DETECTED NOT DETECTED Final    Comment: Performed at [facility], [place] [address] [place], [place] [address]  Urine culture     Status: None   Collection Time: 04/18/18  3:34 PM  Result Value Ref Range Status   Specimen Description   Final    URINE, RANDOM Performed at [facility] [address], [place], [place] [address]    Special Requests   Final    NONE Performed at [facility] [place] [address], [place], [place] [address]    Culture   Final    NO GROWTH Performed at [facility], [place] [address] [place], [place] [address]    Report Status 04/19/2018 FINAL  Final  Culture, respiratory (non-expectorated)     Status: None (Preliminary result)   Collection Time: 04/19/18  3:48 PM  Result Value Ref Range Status   Specimen Description   Final    TRACHEAL ASPIRATE Performed at [facility] [address], [place], [place] [address]    Special Requests   Final    NONE Performed at [facility], [place] [address] [place], [place] [address]    Gram Stain   Final    ABUNDANT WBC PRESENT,BOTH PMN AND MONONUCLEAR ABUNDANT GRAM NEGATIVE RODS Performed at [facility], [place] [address], [place], [place] [address]    Culture FEW GRAM NEGATIVE RODS  Final   Report Status PENDING  Incomplete  MRSA PCR Screening     Status: Abnormal   Collection Time: 04/20/18  6:08 PM  Result Value Ref Range Status   MRSA by  PCR POSITIVE (A) NEGATIVE Final    Comment:        The GeneXpert MRSA Assay (FDA approved for NASAL specimens only), is one component of a comprehensive MRSA colonization surveillance program. It is not intended to diagnose MRSA infection nor to guide or monitor treatment for MRSA infections. RESULT CALLED TO, READ BACK BY AND VERIFIED WITH: B.BLACKBURN AT 2101 ON 04/20/18 BY N.THOMPSON Performed at [facility], [place] [address], [place], [place] [address]          Radiology Studies: Mr Brain Wo Contrast  Result Date: 04/20/2018 CLINICAL DATA:  Unexplained altered level of consciousness EXAM: MRI HEAD WITHOUT CONTRAST TECHNIQUE: Multiplanar, multiecho pulse sequences of the brain and surrounding structures were obtained without intravenous contrast. COMPARISON:  Head CT from 2 days ago.  Brain MRI 06/28/2017 FINDINGS: Brain: No acute infarction, hemorrhage, hydrocephalus, extra-axial collection or mass lesion. Advanced generalized cortical atrophy with predilection for the right hemisphere. There is asymmetric gliosis in the right cerebral white matter. This may be related to chronic severe ischemia on the right. There is no historical indication of focal seizures. Chronic small vessel ischemic type changes seen in the bilateral thalamus. Known meningioma along the posterior right frontal convexity measuring 2.2 cm. There is local cortical mass effect without brain edema. Vascular: Major flow voids are preserved Skull and upper cervical spine: Negative for marrow lesion Sinuses/Orbits: Partial right mastoid opacification with negative nasopharynx. Sphenoid sinus fluid levels. IMPRESSION: 1. No acute finding. 2. Advanced atrophy asymmetrically severe on the right where there is also greater white matter gliosis. This pattern  can be seen with chronic asymmetric ischemia and high-grade arterial stenosis in the neck or with chronic unilateral seizures. 3. Incidental meningioma  along the right convexity that is stable. Electronically Signed   By: Monte Fantasia M.D.   On: 04/20/2018 12:30        Scheduled Meds: . apixaban  2.5 mg Per Tube BID  . aspirin  81 mg Oral Daily  . carvedilol  6.25 mg Oral BID WC  . chlorhexidine gluconate (MEDLINE KIT)  15 mL Mouth Rinse BID  . Chlorhexidine Gluconate Cloth  6 each Topical Daily  . collagenase   Topical Daily  . famotidine  20 mg Oral QHS  . feeding supplement (PRO-STAT SUGAR FREE 64)  30 mL Per Tube Daily  . feeding supplement (VITAL HIGH PROTEIN)  1,000 mL Per Tube Q24H  . free water  200 mL Per Tube Q8H  . mouth rinse  15 mL Mouth Rinse 10 times per day  . mupirocin ointment  1 application Nasal BID  . pantoprazole sodium  40 mg Per Tube Daily  . sodium chloride flush  10-40 mL Intracatheter Q12H  . sodium chloride flush  3 mL Intravenous Q12H   Continuous Infusions: . levETIRAcetam Stopped (04/21/18 0550)  . valproate sodium Stopped (04/20/18 2223)     LOS: 3 days    Time spent: [place], MD [facility]  If 7PM-7AM, please contact night-coverage [email or web address] Password [email or web address] 04/21/2018, 9:20 AM

## 2018-04-21 NOTE — Progress Notes (Signed)
NAME:  Melissa Cochran, MRN:  [id number], DOB:  05-17-1935, LOS: 3 ADMISSION DATE:  04/18/2018, CONSULTATION DATE:  04/18/18 REFERRING MD:  Florene Glen, CHIEF COMPLAINT:  Acute encephalopathy   Brief History   82 y/o female who has been vent dependent since April 2019 was admitted to [facility] for acute change in mental status on 04/18/18. Came from [place].   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 rate 15, height 5\' 4"  (1.626 m), weight 69.1 kg, SpO2 97 %.    Vent Mode: PSV;CPAP FiO2 (%):  [28 %] 28 % Set Rate:  [12 bmp] 12 bmp Vt Set:  [430 mL] 430 mL PEEP:  [5 cmH20] 5 cmH20 Pressure Support:  [5 cmH20-10 cmH20] 10 cmH20 Plateau Pressure:  [16 cmH20-18 cmH20] 16 cmH20   Intake/Output Summary (Last 24 hours) at 04/21/2018 6294 Last data filed at 04/21/2018 0601 Gross per 24 hour  Intake 402.94 ml  Output 1000 ml  Net -597.06 ml   Filed Weights   04/18/18 1810 04/19/18 0329 04/21/18 0500  Weight: 63.5 kg 63.5 kg 69.1 kg    Examination:  General:  In bed on vent HENT: NCAT trach in place PULM: CTA B, vent supported breathing CV: RRR, no mgr GI: BS+, soft, nontender MSK: diminished muscular tone with wasting legs Neuro: eye opening today to voice      Resolved Hospital Problem list     Assessment & Plan:  Chronic respiratory failure with hypoxemia:  > OK to wean on pressure  support, plan PRVC at night > VAP prevention   Acute encephalopathy: multifactorial, no one clear underlying cause > per neurology  Seizure disorder > per neuro  Hypernatremia: per TRH > continue free water via tube > follow BMET  At this point I would consider transfer back to [facility] if no other active issues to manage   Best practice:  Diet: TRH Pain/Anxiety/Delirium protocol (if indicated): N/A VAP protocol (if indicated): yes DVT prophylaxis: add sub cutaneous heparin GI prophylaxis: n/a  Rest per [facility]  Labs   CBC: Recent Labs  Lab 04/18/18 1215 04/19/18 0415 04/20/18 0607 04/21/18 0342  WBC 9.1 7.8 6.4 7.6  NEUTROABS 6.8  --   --   --   HGB 8.7* 8.9* 8.2* 8.8*  HCT 34.1* 33.0* 29.8* 31.6*  MCV 104.0* 100.0 97.7 95.2  PLT 186 172 151 765    Basic Metabolic Panel: Recent Labs  Lab 04/18/18 1215 04/18/18 2145 04/19/18 0415 04/19/18 1350 04/19/18 1654 04/20/18 0607 04/20/18 1800 04/21/18 0342  NA 151* 154* 151*  --   --  141  --  139  K 4.3 3.9 3.9  --   --  3.3*  --  3.5  CL 113* 120* 116*  --   --  108  --  108  CO2 31 25 27   --   --  27  --  25  GLUCOSE 111* 103* 103*  --   --  122*  --  109*  BUN 46* 37* 40*  --   --  36*  --  31*  CREATININE 0.39* 0.32* 0.32*  --   --  0.33*  --  <0.30*  CALCIUM 9.6 8.6* 9.1  --   --  8.7*  --  8.8*  MG  --  2.3 2.4  --   --  2.1  --   --   PHOS  --   --   --  2.4* 2.4* 2.4* 2.4*  --    GFR: CrCl cannot be calculated (This lab value cannot be used to calculate CrCl because it is not a number: <0.30). Recent Labs  Lab 04/18/18 1215 04/18/18 1222 04/18/18 2145 04/19/18 0415 04/20/18 0607 04/21/18 0342  PROCALCITON  --   --  <0.10 <0.10 <0.10  --   WBC 9.1  --   --  7.8 6.4 7.6  LATICACIDVEN  --  1.46  --   --   --   --     Liver Function Tests: Recent Labs  Lab 04/18/18 1215 04/19/18 0415 04/20/18 0607  AST 20 16 16   ALT 29 22 21   ALKPHOS 53 45 43  BILITOT 0.9 1.1 0.9  PROT 6.1* 5.5* 5.0*    ALBUMIN 2.7* 2.2* 2.0*   No results for input(s): LIPASE, AMYLASE in the last 168 hours. Recent Labs  Lab 04/18/18 2145  AMMONIA 25    ABG    Component Value Date/Time   PHART 7.500 (H) 04/18/2018 1221   PCO2ART 37.3 04/18/2018 1221   PO2ART 78.3 (L) 04/18/2018 1221   HCO3 28.9 (H) 04/18/2018 1221   TCO2 27 06/26/2017 0210   ACIDBASEDEF 2.0 06/26/2017 0210   O2SAT 96.6 04/18/2018 1221     Coagulation Profile: No results for input(s): INR, PROTIME in the last 168 hours.  Cardiac Enzymes: Recent Labs  Lab 04/18/18 1215 04/18/18 2145  TROPONINI 0.03* <0.03    HbA1C: Hgb A1c MFr Bld  Date/Time Value Ref Range Status  02/13/2017 09:35 PM 5.1 4.8 - 5.6 % Final    Comment:    (NOTE) Pre diabetes:          5.7%-6.4% Diabetes:              >6.4% Glycemic control for   <7.0% adults with diabetes     CBG: Recent Labs  Lab 04/20/18 1610 04/20/18 1955 04/20/18 2330 04/21/18 0357 04/21/18 0745  GLUCAP 109* 103* 112* 101* 108*       Critical care time: none    Roselie Awkward, MD [facility] Pager: [phone] Cell: [phone] If no response, call [phone]

## 2018-04-22 LAB — GLUCOSE, CAPILLARY
Glucose-Capillary: 105 mg/dL — ABNORMAL HIGH (ref 70–99)
Glucose-Capillary: 111 mg/dL — ABNORMAL HIGH (ref 70–99)
Glucose-Capillary: 124 mg/dL — ABNORMAL HIGH (ref 70–99)
Glucose-Capillary: 102 mg/dL — ABNORMAL HIGH (ref 70–99)
Glucose-Capillary: 107 mg/dL — ABNORMAL HIGH (ref 70–99)

## 2018-04-22 LAB — CULTURE, RESPIRATORY W GRAM STAIN

## 2018-04-22 LAB — CULTURE, BLOOD (ROUTINE X 2)

## 2018-04-22 LAB — CULTURE, RESPIRATORY

## 2018-04-22 NOTE — Progress Notes (Signed)
PROGRESS NOTE    Melissa Cochran  HGD:[id number] DOB: 16-Oct-1935 DOA: 04/18/2018 PCP: [name], MD   Brief Narrative:  82 year old with past medical history relevant for chronic atrial fibrillation on apixaban, hypertension, coronary artery disease status post stent in 9622, chronic systolic heart failure with EF of 45 to 50% by echo on 06/28/2017, nephrolithiasis complicated by hydronephrosis status post stent placement in 2017, history of bilateral hydronephrosis, CVA, seizure disorder complicated by chronic respiratory failure with ventilator dependence and PEG tube placement who was admitted from [facility] for lethargy and electrolyte imbalances.   Assessment & Plan:   Principal Problem:   Acute encephalopathy Active Problems:   Essential (primary) hypertension   History of cardiac catheterization   Hyperlipidemia   DVT (deep venous thrombosis) (HCC)   Seizures (HCC)   A-fib (HCC)   CVA (cerebral vascular accident) ([place])   Chronic respiratory failure ([place])   Acute respiratory failure with hypoxia ([place])   #) Acute metabolic encephalopathy: Improving but patient continues to be altered.  Again her baseline is unclear though reportedly she was able to speak using a Passy-Muir valve.  Have discussed with neurology and they felt like possible repeat EEG might be reasonable. - EEG on 04/19/2018 shows occasional sharp activity over right frontotemporal region which neurology is attributing to a meningioma -Repeat EEG on 04/22/2018 pending -Neurology recommends holding on lumbar puncture at this time -Neurology following, appreciate recommendations - B12 and folate replete, RPR negative - Continue antiepileptics per below -Brain MRI shows no acute process but does show old stroke and angioma  #) Torticollis: -Physical therapy consult  #) Possible pneumonia/bacterial tracheitis: Patient reportedly was treated for pneumonia at [facility] with meropenem and vancomycin.   There is no culture results from this that of return.  Of note there was a concern that meropenem could decrease the valproic acid level and cause patient to have a seizure.  At this time the relevance of this respiratory culture from 04/19/2018 growing Pseudomonas is unclear particularly the patient does not have any other infectious symptoms and a procalcitonin is quite low. - blood cultures on 04/18/2018 growing 1 out of 2 coagulase-negative staph --Respiratory culture from 04/19/2018 growing Pseudomonas -Repeat respiratory culture from 04/21/2018 no growth to date - Procalcitonin was undetectable on admission  #) Seizure disorder: -Hold levetiracetam thousand milligrams twice daily by tube -Hold valproic acid 250 mg twice daily by tube - Continue levetiracetam thousand milligrams IV twice daily -Continue IV valproic acid 250 mg twice daily - Valproic acid level low at 13, levetiracetam level 39.4  #) Chronic respiratory failure status post tracheostomy: -PCCM following, appreciate t recommendations  #) Hypernatremia with PEG tube dependence: Resolved with free water flushes and D5W -Continue free water flushes 200 mL's every 8 hours - Vital 1.5 @ 40 mL/hr with 30 mL Prostat BID  #) Chronic atrial fibrillation complicated by CVA: -Continue beta-blocker -Continue apixaban 2.5 mg twice daily  #) Hypertension/hyperlipidemia/coronary artery disease status post stent: -Continue aspirin 81 mg -Continue carvedilol 6.25 mg twice daily  #) Anemia: This appears to be stable at this time.  Suspect most likely iron deficiency anemia and anemia of chronic disease.  #) Nephrolithiasis/hydronephrosis status post stents: Currently her creatinine is normal. -Low threshold to repeat CT abdomen   #) Chronic systolic heart failure: Last echo in February showed EF of 40 to 45% -Continue furosemide 40 mg daily via tube  Fluids: Free water flushes Electrolytes: Per above Nutrition: Per  above  Prophylaxis: Apixaban  Disposition: Pending return to baseline mental status  Full code  Consultants:   PCCM  Neurology  Procedures:   04/19/2018 PTW:SFKC is an abnormal electroencephalogram due to occasional sharp activity over the right frontotemporal region. This is suggestive of a focal disturbance over right hemisphere with epileptogenic potential.  04/22/2018 EEG: Pending  Antimicrobials:   None   Subjective: This morning patient does awake to arousal.  She occasionally squeezes the right upper extremity hand but does not wiggle her right lower extremity toes.  Objective: Vitals:   04/22/18 0322 04/22/18 0400 04/22/18 0500 04/22/18 0800  BP: 100/71 105/84 (!) 97/48   Pulse: 75 77 75   Resp: '20 18 19   '$ Temp:    98.4 F (36.9 C)  TempSrc:    Oral  SpO2: 100% 96% 97%   Weight:   69.1 kg   Height:        Intake/Output Summary (Last 24 hours) at 04/22/2018 0950 Last data filed at 04/22/2018 0830 Gross per 24 hour  Intake 706.6 ml  Output 1892 ml  Net -1185.4 ml   Filed Weights   04/19/18 0329 04/21/18 0500 04/22/18 0500  Weight: 63.5 kg 69.1 kg 69.1 kg    Examination:  General exam: No acute distress Respiratory system: Anterior lung sounds with scattered rhonchi Cardiovascular system: Distant heart sounds, regular rate and rhythm, no murmurs Gastrointestinal system: Soft, nondistended, no rebound or guarding, plus bowel sounds Central nervous system: Awake but not alert or oriented, left-sided hemiparesis, intermittently squeezes right hand but does not wiggle right feet, torticollis towards right side noted Extremities: Lower extremity edema Skin: Tracheostomy site is clean dry and intact, PEG tube site is clean dry and intact Psychiatry: Unable to assess due to medical condition    Data Reviewed: I have personally reviewed following labs and imaging studies  CBC: Recent Labs  Lab 04/18/18 1215 04/19/18 0415 04/20/18 0607  04/21/18 0342  WBC 9.1 7.8 6.4 7.6  NEUTROABS 6.8  --   --   --   HGB 8.7* 8.9* 8.2* 8.8*  HCT 34.1* 33.0* 29.8* 31.6*  MCV 104.0* 100.0 97.7 95.2  PLT 186 172 151 127   Basic Metabolic Panel: Recent Labs  Lab 04/18/18 1215 04/18/18 2145 04/19/18 0415 04/19/18 1350 04/19/18 1654 04/20/18 0607 04/20/18 1800 04/21/18 0342  NA 151* 154* 151*  --   --  141  --  139  K 4.3 3.9 3.9  --   --  3.3*  --  3.5  CL 113* 120* 116*  --   --  108  --  108  CO2 '31 25 27  '$ --   --  27  --  25  GLUCOSE 111* 103* 103*  --   --  122*  --  109*  BUN 46* 37* 40*  --   --  36*  --  31*  CREATININE 0.39* 0.32* 0.32*  --   --  0.33*  --  <0.30*  CALCIUM 9.6 8.6* 9.1  --   --  8.7*  --  8.8*  MG  --  2.3 2.4  --   --  2.1  --   --   PHOS  --   --   --  2.4* 2.4* 2.4* 2.4*  --    GFR: CrCl cannot be calculated (This lab value cannot be used to calculate CrCl because it is not a number: <0.30). Liver Function Tests: Recent Labs  Lab 04/18/18 1215 04/19/18 0415 04/20/18 0607  AST  $'20 16 16  'u$ ALT '29 22 21  '$ ALKPHOS 53 45 43  BILITOT 0.9 1.1 0.9  PROT 6.1* 5.5* 5.0*  ALBUMIN 2.7* 2.2* 2.0*   No results for input(s): LIPASE, AMYLASE in the last 168 hours. Recent Labs  Lab 04/18/18 2145  AMMONIA 25   Coagulation Profile: No results for input(s): INR, PROTIME in the last 168 hours. Cardiac Enzymes: Recent Labs  Lab 04/18/18 1215 04/18/18 2145  TROPONINI 0.03* <0.03   BNP (last 3 results) No results for input(s): PROBNP in the last 8760 hours. HbA1C: No results for input(s): HGBA1C in the last 72 hours. CBG: Recent Labs  Lab 04/21/18 1607 04/21/18 1931 04/21/18 2346 04/22/18 0254 04/22/18 0745  GLUCAP 108* 92 111* 105* 111*   Lipid Profile: No results for input(s): CHOL, HDL, LDLCALC, TRIG, CHOLHDL, LDLDIRECT in the last 72 hours. Thyroid Function Tests: No results for input(s): TSH, T4TOTAL, FREET4, T3FREE, THYROIDAB in the last 72 hours. Anemia Panel: No results for  input(s): VITAMINB12, FOLATE, FERRITIN, TIBC, IRON, RETICCTPCT in the last 72 hours. Sepsis Labs: Recent Labs  Lab 04/18/18 1222 04/18/18 2145 04/19/18 0415 04/20/18 0607  PROCALCITON  --  <0.10 <0.10 <0.10  LATICACIDVEN 1.46  --   --   --     Recent Results (from the past 240 hour(s))  Blood Culture (routine x 2)     Status: None (Preliminary result)   Collection Time: 04/18/18 12:15 PM  Result Value Ref Range Status   Specimen Description BLOOD PICC LINE  Final   Special Requests   Final    BOTTLES DRAWN AEROBIC AND ANAEROBIC Blood Culture adequate volume   Culture   Final    NO GROWTH 3 DAYS Performed at [facility], [place] [address] [place], [place] [address]    Report Status PENDING  Incomplete  Blood Culture (routine x 2)     Status: Abnormal   Collection Time: 04/18/18  3:05 PM  Result Value Ref Range Status   Specimen Description   Final    BLOOD RIGHT HAND Performed at [facility] [address], [place], [place] [address]    Special Requests   Final    BOTTLES DRAWN AEROBIC ONLY Blood Culture results may not be optimal due to an inadequate volume of blood received in culture bottles Performed at [facility] [address] [place], [place] [address]    Culture  Setup Time   Final    GRAM POSITIVE COCCI AEROBIC BOTTLE ONLY CRITICAL RESULT CALLED TO, READ BACK BY AND VERIFIED WITH: L POINDEXTER PHARMD 0201 04/20/18 A BROWNING    Culture (A)  Final    STAPHYLOCOCCUS SPECIES (COAGULASE NEGATIVE) THE SIGNIFICANCE OF ISOLATING THIS ORGANISM FROM A SINGLE SET OF BLOOD CULTURES WHEN MULTIPLE SETS ARE DRAWN IS UNCERTAIN. PLEASE NOTIFY THE MICROBIOLOGY DEPARTMENT WITHIN ONE WEEK IF SPECIATION AND SENSITIVITIES ARE REQUIRED. Performed at [facility], [place] [address], [place],  [address]    Report Status 04/22/2018 FINAL  Final  Blood Culture ID Panel (Reflexed)     Status: None   Collection Time: 04/18/18  3:05  PM  Result Value Ref Range Status   Enterococcus species NOT DETECTED NOT DETECTED Final   Listeria monocytogenes NOT DETECTED NOT DETECTED Final   Staphylococcus species NOT DETECTED NOT DETECTED Final   Staphylococcus aureus (BCID) NOT DETECTED NOT DETECTED Final   Streptococcus species NOT DETECTED NOT DETECTED Final   Streptococcus agalactiae NOT DETECTED NOT DETECTED  Final   Streptococcus pneumoniae NOT DETECTED NOT DETECTED Final   Streptococcus pyogenes NOT DETECTED NOT DETECTED Final   Acinetobacter baumannii NOT DETECTED NOT DETECTED Final   Enterobacteriaceae species NOT DETECTED NOT DETECTED Final   Enterobacter cloacae complex NOT DETECTED NOT DETECTED Final   Escherichia coli NOT DETECTED NOT DETECTED Final   Klebsiella oxytoca NOT DETECTED NOT DETECTED Final   Klebsiella pneumoniae NOT DETECTED NOT DETECTED Final   Proteus species NOT DETECTED NOT DETECTED Final   Serratia marcescens NOT DETECTED NOT DETECTED Final   Haemophilus influenzae NOT DETECTED NOT DETECTED Final   Neisseria meningitidis NOT DETECTED NOT DETECTED Final   Pseudomonas aeruginosa NOT DETECTED NOT DETECTED Final   Candida albicans NOT DETECTED NOT DETECTED Final   Candida glabrata NOT DETECTED NOT DETECTED Final   Candida krusei NOT DETECTED NOT DETECTED Final   Candida parapsilosis NOT DETECTED NOT DETECTED Final   Candida tropicalis NOT DETECTED NOT DETECTED Final    Comment: Performed at [facility], [place] [address] [place], [place] [address]  Urine culture     Status: None   Collection Time: 04/18/18  3:34 PM  Result Value Ref Range Status   Specimen Description   Final    URINE, RANDOM Performed at [facility] [address] [place], [place] [address]    Special Requests   Final    NONE Performed at [facility], [place] [address], [place], [place] [address]    Culture   Final    NO GROWTH Performed at [facility], [place] [address],  [place], [place] [address]    Report Status 04/19/2018 FINAL  Final  Culture, respiratory (non-expectorated)     Status: None (Preliminary result)   Collection Time: 04/19/18  3:48 PM  Result Value Ref Range Status   Specimen Description   Final    TRACHEAL ASPIRATE Performed at [facility] [address], [place], [place] [address]    Special Requests   Final    NONE Performed at [facility], [place] [address], [place], [place] [address]    Gram Stain   Final    ABUNDANT WBC PRESENT,BOTH PMN AND MONONUCLEAR ABUNDANT GRAM NEGATIVE RODS Performed at [facility], [place] [address] [place], [place] [address]    Culture FEW PSEUDOMONAS AERUGINOSA  Final   Report Status PENDING  Incomplete  MRSA PCR Screening     Status: Abnormal   Collection Time: 04/20/18  6:08 PM  Result Value Ref Range Status   MRSA by PCR POSITIVE (A) NEGATIVE Final    Comment:        The GeneXpert MRSA Assay (FDA approved for NASAL specimens only), is one component of a comprehensive MRSA colonization surveillance program. It is not intended to diagnose MRSA infection nor to guide or monitor treatment for MRSA infections. RESULT CALLED TO, READ BACK BY AND VERIFIED WITH: B.BLACKBURN AT 2101 ON 04/20/18 BY N.THOMPSON Performed at [facility], [place] [address] [place], [place] [address]          Radiology Studies: Mr Brain Wo Contrast  Result Date: 04/20/2018 CLINICAL DATA:  Unexplained altered level of consciousness EXAM: MRI HEAD WITHOUT CONTRAST TECHNIQUE: Multiplanar, multiecho pulse sequences of the brain and surrounding structures were obtained without intravenous contrast. COMPARISON:  Head CT from 2 days ago.  Brain MRI 06/28/2017 FINDINGS: Brain: No acute infarction, hemorrhage, hydrocephalus, extra-axial collection or mass lesion. Advanced generalized cortical  atrophy with predilection for the right hemisphere. There is asymmetric  gliosis in the right cerebral white matter. This may be related to chronic severe ischemia on the right. There is no historical indication of focal seizures. Chronic small vessel ischemic type changes seen in the bilateral thalamus. Known meningioma along the posterior right frontal convexity measuring 2.2 cm. There is local cortical mass effect without brain edema. Vascular: Major flow voids are preserved Skull and upper cervical spine: Negative for marrow lesion Sinuses/Orbits: Partial right mastoid opacification with negative nasopharynx. Sphenoid sinus fluid levels. IMPRESSION: 1. No acute finding. 2. Advanced atrophy asymmetrically severe on the right where there is also greater white matter gliosis. This pattern can be seen with chronic asymmetric ischemia and high-grade arterial stenosis in the neck or with chronic unilateral seizures. 3. Incidental meningioma along the right convexity that is stable. Electronically Signed   By: Monte Fantasia M.D.   On: 04/20/2018 12:30        Scheduled Meds: . apixaban  2.5 mg Per Tube BID  . aspirin  81 mg Oral Daily  . carvedilol  6.25 mg Oral BID WC  . chlorhexidine gluconate (MEDLINE KIT)  15 mL Mouth Rinse BID  . Chlorhexidine Gluconate Cloth  6 each Topical Daily  . collagenase   Topical Daily  . famotidine  20 mg Oral QHS  . feeding supplement (PRO-STAT SUGAR FREE 64)  30 mL Per Tube Daily  . feeding supplement (VITAL HIGH PROTEIN)  1,000 mL Per Tube Q24H  . free water  200 mL Per Tube Q8H  . furosemide  40 mg Oral Daily  . mouth rinse  15 mL Mouth Rinse 10 times per day  . mupirocin ointment  1 application Nasal BID  . pantoprazole sodium  40 mg Per Tube Daily  . sodium chloride flush  10-40 mL Intracatheter Q12H  . sodium chloride flush  3 mL Intravenous Q12H   Continuous Infusions: . levETIRAcetam Stopped (04/22/18 0533)  . valproate sodium Stopped (04/21/18 2221)     LOS: 4 days    Time spent: [place], MD [facility]  If 7PM-7AM, please contact night-coverage [email or web address] Password [email or web address] 04/22/2018, 9:50 AM

## 2018-04-22 NOTE — Progress Notes (Signed)
PT Cancellation Note  Patient Details Name: DEJANAY WAMBOLDT MRN: [id number] DOB: November 26, 1935   Cancelled Treatment:    Reason Eval/Treat Not Completed: PT screened, no needs identified, will sign offpatient is from [facility] and vent dependent. Can  Resume PT if indicated at next venue.(? Return to [facility])   Morris, Shella Maxim 04/22/2018, 10:52 AM  [place] Pager [phone] Office [phone]

## 2018-04-23 ENCOUNTER — Inpatient Hospital Stay (HOSPITAL_COMMUNITY): Payer: Medicare Other

## 2018-04-23 LAB — CBC
HCT: 29.6 % — ABNORMAL LOW (ref 36.0–46.0)
Hemoglobin: 8.2 g/dL — ABNORMAL LOW (ref 12.0–15.0)
MCH: 27.2 pg (ref 26.0–34.0)
MCHC: 27.7 g/dL — ABNORMAL LOW (ref 30.0–36.0)
MCV: 98 fL (ref 80.0–100.0)
Platelets: 160 10*3/uL (ref 150–400)
RBC: 3.02 MIL/uL — ABNORMAL LOW (ref 3.87–5.11)
RDW: 21.7 % — ABNORMAL HIGH (ref 11.5–15.5)
WBC: 7.3 10*3/uL (ref 4.0–10.5)
nRBC: 0.3 % — ABNORMAL HIGH (ref 0.0–0.2)

## 2018-04-23 LAB — COMPREHENSIVE METABOLIC PANEL WITH GFR
Alkaline Phosphatase: 50 U/L (ref 38–126)
Anion gap: 8 (ref 5–15)
Sodium: 141 mmol/L (ref 135–145)

## 2018-04-23 LAB — COMPREHENSIVE METABOLIC PANEL
ALT: 15 U/L (ref 0–44)
AST: 42 U/L — ABNORMAL HIGH (ref 15–41)
Albumin: 2.1 g/dL — ABNORMAL LOW (ref 3.5–5.0)
BUN: 32 mg/dL — ABNORMAL HIGH (ref 8–23)
CO2: 28 mmol/L (ref 22–32)
Calcium: 8.7 mg/dL — ABNORMAL LOW (ref 8.9–10.3)
Chloride: 105 mmol/L (ref 98–111)
Creatinine, Ser: 0.33 mg/dL — ABNORMAL LOW (ref 0.44–1.00)
GFR calc Af Amer: >60 mL/min (ref >60)
GFR calc non Af Amer: >60 mL/min (ref >60)
Glucose, Bld: 111 mg/dL — ABNORMAL HIGH (ref 70–99)
Potassium: 4.7 mmol/L (ref 3.5–5.1)
Total Bilirubin: 1.1 mg/dL (ref 0.3–1.2)
Total Protein: 5.2 g/dL — ABNORMAL LOW (ref 6.5–8.1)

## 2018-04-23 LAB — GLUCOSE, CAPILLARY
Glucose-Capillary: 108 mg/dL — ABNORMAL HIGH (ref 70–99)
Glucose-Capillary: 112 mg/dL — ABNORMAL HIGH (ref 70–99)
Glucose-Capillary: 118 mg/dL — ABNORMAL HIGH (ref 70–99)
Glucose-Capillary: 96 mg/dL (ref 70–99)

## 2018-04-23 LAB — MAGNESIUM: Magnesium: 2.1 mg/dL (ref 1.7–2.4)

## 2018-04-23 MED ORDER — PIPERACILLIN-TAZOBACTAM 3.375 G IVPB 30 MIN: 3.3750 g | Freq: Four times a day (QID) | INTRAVENOUS | Status: DC

## 2018-04-23 MED ORDER — VALPROIC ACID 250 MG/5ML PO SOLN
500.0000 mg | Freq: Two times a day (BID) | ORAL | Status: DC
  Administered 2018-04-23 – 2018-05-02 (×18): 500 mg via ORAL
  Filled 2018-04-23 (×18): qty 10

## 2018-04-23 MED ORDER — PIPERACILLIN-TAZOBACTAM 3.375 G IVPB
3.3750 g | Freq: Three times a day (TID) | INTRAVENOUS | Status: DC
  Filled 2018-04-23: qty 50

## 2018-04-23 MED ORDER — VALPROATE SODIUM 250 MG/5ML PO SOLN
500.0000 mg | Freq: Two times a day (BID) | ORAL | Status: DC
  Filled 2018-04-23: qty 10

## 2018-04-23 MED ORDER — LEVETIRACETAM 100 MG/ML PO SOLN
1000.0000 mg | Freq: Two times a day (BID) | ORAL | Status: DC
  Administered 2018-04-23 – 2018-05-02 (×19): 1000 mg via ORAL
  Filled 2018-04-23 (×19): qty 10

## 2018-04-23 NOTE — Progress Notes (Addendum)
Contacted [facility] rep, Raquel Sarna for follow up on dc back to LT[facility] today. Will follow up with NCM on dc. Jonnie Finner RN CCM Case Mgmt phone [phone]  04/23/2018 1:11 pm Received call from [facility], they are unable to accept pt back. Pt does not have any additional coverage days for LTAC. Notified attending. Jonnie Finner RN CCM Case Mgmt phone [phone]

## 2018-04-23 NOTE — Discharge Instructions (Signed)
Seizure, Adult °When you have a seizure: °· Parts of your body may move. °· How aware or awake (conscious) you are may change. °· You may shake (convulse). ° °Some people have symptoms right before a seizure happens. These symptoms may include: °· Fear. °· Worry (anxiety). °· Feeling like you are going to throw up (nausea). °· Feeling like the room is spinning (vertigo). °· Feeling like you saw or heard something before (deja vu). °· Odd tastes or smells. °· Changes in vision, such as seeing flashing lights or spots. ° °Seizures usually last from 30 seconds to 2 minutes. Usually, they are not harmful unless they last a long time. °Follow these instructions at home: °Medicines °· Take over-the-counter and prescription medicines only as told by your doctor. °· Avoid anything that may keep your medicine from working, such as alcohol. °Activity °· Do not do any activities that would be dangerous if you had another seizure, like driving or swimming. Wait until your doctor approves. °· If you live in the [place] ask your local DMV (department of motor vehicles) when you can drive. °· Rest. °Teaching others °· Teach friends and family what to do when you have a seizure. They should: °? Lay you on the ground. °? Protect your head and body. °? Loosen any tight clothing around your neck. °? Turn you on your side. °? Stay with you until you are better. °? Not hold you down. °? Not put anything in your mouth. °? Know whether or not you need emergency care. °General instructions °· Contact your doctor each time you have a seizure. °· Avoid anything that gives you seizures. °· Keep a seizure diary. Write down: °? What you think caused each seizure. °? What you remember about each seizure. °· Keep all follow-up visits as told by your doctor. This is important. °Contact a doctor if: °· You have another seizure. °· You have seizures more often. °· There is any change in what happens during your seizures. °· You continue to have  seizures with treatment. °· You have symptoms of being sick or having an infection. °Get help right away if: °· You have a seizure: °? That lasts longer than 5 minutes. °? That is different than seizures you had before. °? That makes it harder to breathe. °? After you hurt your head. °· After a seizure, you cannot speak or use a part of your body. °· After a seizure, you are confused or have a bad headache. °· You have two or more seizures in a row. °· You are having seizures more often. °· You do not wake up right after a seizure. °· You get hurt during a seizure. °In an emergency: °· These symptoms may be an emergency. Do not wait to see if the symptoms will go away. Get medical help right away. Call your local emergency services (911 in the [place] Do not drive yourself to the hospital. °This information is not intended to replace advice given to you by your health care provider. Make sure you discuss any questions you have with your health care provider. °Document Released: 10/13/2007 Document Revised: 01/07/2016 Document Reviewed: 01/07/2016 °Elsevier Interactive Patient Education © 2017 [employer] ° °

## 2018-04-23 NOTE — Discharge Summary (Signed)
Physician Discharge Summary  Melissa Cochran:[id number] DOB: 14-Jan-1936 DOA: 04/18/2018  PCP: [name], MD  Admit date: 04/18/2018 Discharge date: 04/23/2018  [facility]: [facility]  Recommendations for Outpatient Follow-up:  1. Follow up with PCP in 1-2 weeks 2. Please obtain BMP/CBC in one week   Home Health: No Equipment/Devices: PEG tube, tracheostomy  Discharge Condition: Stable CODE STATUS: Full Diet recommendation: Vital 1.5 at 40 mils an hour with 30 mL's of pro-stat twice daily, free water flushes 200 mils every 8 hours  Brief/Interim Summary:  #) Acute metabolic encephalopathy: Patient was initially admitted with altered mental status and acute metabolic encephalopathy.  This was thought to be a postictal state.  Her baseline was unclear during this hospitalization.  Neurology was consulted and recommended EEG.  An EEG on 04/19/2018 showed right activity over the frontotemporal region which was attributed to a meningioma.  Patient was started on IV levetiracetam and valproic acid.  Valproic acid levels were noted to be undetectable initially and then repeat check showed that they were increasing.  Her levetiracetam levels were normal..  This was thought to be secondary to use of meropenem as patient was being treated for possible hospital-acquired pneumonia.  Patient did have a procalcitonin which was undetectable here.  Her blood cultures were negative.  She did have a sputum culture that grew out Pseudomonas that was sensitive only to Zosyn and ceftaz edema however she had no evidence of pneumonia on chest x-ray and this was felt to be a colonizer.  Patient did have an MRI that showed no acute process other than old stroke and known meningioma that was stable.  Patient's mental status improved and patient was discharged [facility].  She should have her antiepileptic levels checked intermittently.  #) Torticollis: Patient was  noted to have torticollis.  She should continue with physical therapy.  #) Possible pneumonia/bacterial tracheitis: Patient apparently has been treated with meropenem and vancomycin [facility].  Here her cultures were negative except for sputum culture that grew out Pseudomonas that was felt to be a colonizer.  1 out of 2 blood cultures grew out coagulase-negative staph after several days with plan to be contaminant.  Her procalcitonin was undetectable.  Her urine culture was unremarkable.  #) Hypernatremia with PEG tube dependence: Patient was noted initially to be hyponatremic.  She was given free water as well as started on free water flushes.  She was started on vital 1.5 at 40 mils an hour with 30 mL's per stent twice daily.  #) Seizure disorder: Please see above the patient was briefly on IV levetiracetam and valproic acid however these were restarted by tube on discharge.  Levels were low with valproic acid but were improving on recheck.  #) Chronic respiratory failure status post tracheostomy: PCCM was consulted and did manage the ventilator.  Patient was maintained on the ventilator during her hospitalization.  #) Chronic atrial fibrillation complicated by CVA: Patient was continued on beta-blocker and apixaban.  #) Hypertension/hyperlipidemia/coronary artery disease: Patient was continued on aspirin and carvedilol.  #) Anemia: This was noted to be stable during this hospitalization.  #) Nephrolithiasis/hydronephrosis status post stents: Patient's creatinine was normal.  Repeat abdomen pelvis CT was not performed as she appeared to have no evidence of AKI or post renal obstruction.  #) Chronic systolic heart failure: Patient's echo on February 2019 showed an EF of 40 to 45%.  She was continued on her home furosemide dose here.  Discharge  Diagnoses:  Principal Problem:   Acute encephalopathy Active Problems:   Essential (primary) hypertension   History of cardiac catheterization    Hyperlipidemia   DVT (deep venous thrombosis) (HCC)   Seizures (HCC)   A-fib (HCC)   CVA (cerebral vascular accident) ([place])   Chronic respiratory failure ([place])   Acute respiratory failure with hypoxia [facility])    Discharge Instructions  Discharge Instructions    Call MD for:  difficulty breathing, headache or visual disturbances   Complete by:  As directed    Call MD for:  hives   Complete by:  As directed    Call MD for:  persistant nausea and vomiting   Complete by:  As directed    Call MD for:  redness, tenderness, or signs of infection (pain, swelling, redness, odor or green/yellow discharge around incision site)   Complete by:  As directed    Call MD for:  severe uncontrolled pain   Complete by:  As directed    Call MD for:  temperature >100.4   Complete by:  As directed    Diet - low sodium heart healthy   Complete by:  As directed    Discharge instructions   Complete by:  As directed    Please follow-up with your primary care doctor in 1 week.   Increase activity slowly   Complete by:  As directed      Allergies as of 04/23/2018   No Known Allergies     Medication List    STOP taking these medications   piperacillin-tazobactam 3.375 GM/50ML IVPB Commonly known as:  ZOSYN   vancomycin 1,000 mg in sodium chloride 0.9 % 250 mL     TAKE these medications   acetaminophen 325 MG tablet Commonly known as:  TYLENOL Take 2 tablets (650 mg total) by mouth every 6 (six) hours as needed for mild pain (or Fever >/= 101).   apixaban 2.5 MG Tabs tablet Commonly known as:  ELIQUIS Place 2.5 mg into feeding tube 2 (two) times daily.   aspirin 81 MG chewable tablet Chew 81 mg by mouth daily.   carvedilol 6.25 MG tablet Commonly known as:  COREG Take 1 tablet (6.25 mg total) by mouth 2 (two) times daily with a meal.   chlorhexidine 0.12 % solution Commonly known as:  PERIDEX Use as directed 15 mLs in the mouth or throat 2 (two) times daily.   diazepam 5 MG  tablet Commonly known as:  VALIUM Take 2.5 mg by mouth every 12 (twelve) hours.   famotidine 20 MG tablet Commonly known as:  PEPCID Take 20 mg by mouth at bedtime.   furosemide 40 MG tablet Commonly known as:  LASIX Take 1 tablet (40 mg total) by mouth daily.   ipratropium-albuterol 0.5-2.5 (3) MG/3ML Soln Commonly known as:  DUONEB Take 3 mLs by nebulization every 6 (six) hours as needed (SOB).   ISOSOURCE 1.5 CAL Liqd 230 mLs by Gastric Tube route every 4 (four) hours.   levETIRAcetam 100 MG/ML solution Commonly known as:  KEPPRA Take 10 mLs (1,000 mg total) by mouth 2 (two) times daily.   Valproate Sodium 250 MG/5ML Soln solution Commonly known as:  DEPAKENE Take 10 mLs (500 mg total) by mouth 2 (two) times daily. What changed:  how much to take       No Known Allergies  Consultations:  PCCM  Neurology   Procedures/Studies: Ct Head Wo Contrast  Result Date: 04/18/2018 CLINICAL DATA:  Encephalopathy EXAM: CT HEAD  WITHOUT CONTRAST TECHNIQUE: Contiguous axial images were obtained from the base of the skull through the vertex without intravenous contrast. COMPARISON:  CT 08/24/2017, MRI 06/28/2017 FINDINGS: Brain: Evaluation is limited by patient positioning. No acute territorial infarction, hemorrhage or new mass is visualized. 2 cm right cranial convexity meningioma, no change. Moderate-to-marked atrophy. Small focus of encephalomalacia in the right occipital lobe. Moderate small vessel ischemic changes of the white matter. Stable ventricle size. Vascular: No hyperdense vessels.  Carotid vascular calcification. Skull: No fracture Sinuses/Orbits: Mucosal disease in the sphenoid and ethmoid sinuses. Other: None IMPRESSION: 1. No definite CT evidence for acute intracranial abnormality. 2. Atrophy and small vessel ischemic changes of the white matter. 3. Stable 2 cm right cranial convexity meningioma. Electronically Signed   By: Donavan Foil M.D.   On: 04/18/2018 18:16    Mr Brain Wo Contrast  Result Date: 04/20/2018 CLINICAL DATA:  Unexplained altered level of consciousness EXAM: MRI HEAD WITHOUT CONTRAST TECHNIQUE: Multiplanar, multiecho pulse sequences of the brain and surrounding structures were obtained without intravenous contrast. COMPARISON:  Head CT from 2 days ago.  Brain MRI 06/28/2017 FINDINGS: Brain: No acute infarction, hemorrhage, hydrocephalus, extra-axial collection or mass lesion. Advanced generalized cortical atrophy with predilection for the right hemisphere. There is asymmetric gliosis in the right cerebral white matter. This may be related to chronic severe ischemia on the right. There is no historical indication of focal seizures. Chronic small vessel ischemic type changes seen in the bilateral thalamus. Known meningioma along the posterior right frontal convexity measuring 2.2 cm. There is local cortical mass effect without brain edema. Vascular: Major flow voids are preserved Skull and upper cervical spine: Negative for marrow lesion Sinuses/Orbits: Partial right mastoid opacification with negative nasopharynx. Sphenoid sinus fluid levels. IMPRESSION: 1. No acute finding. 2. Advanced atrophy asymmetrically severe on the right where there is also greater white matter gliosis. This pattern can be seen with chronic asymmetric ischemia and high-grade arterial stenosis in the neck or with chronic unilateral seizures. 3. Incidental meningioma along the right convexity that is stable. Electronically Signed   By: Monte Fantasia M.D.   On: 04/20/2018 12:30   Portable Chest Xray  Result Date: 04/19/2018 CLINICAL DATA:  Acute respiratory failure with hypoxia. EXAM: PORTABLE CHEST 1 VIEW COMPARISON:  Radiograph of April 18, 2018. FINDINGS: Tracheostomy tube is in grossly good position. Right-sided PICC line is unchanged in position. Stable cardiomediastinal silhouette. No pneumothorax is noted. Increased bibasilar subsegmental atelectasis is noted with  minimal pleural effusions. Bony thorax is unremarkable. IMPRESSION: Stable support apparatus. Increased bibasilar opacities as described above. Electronically Signed   By: Marijo Conception, M.D.   On: 04/19/2018 07:41   Dg Chest Port 1 View  Result Date: 04/18/2018 CLINICAL DATA:  Electrolyte imbalance, increased lethargy. The patient is a tracheostomy patient and is undergoing ventilation. History of CHF and COPD. EXAM: PORTABLE CHEST 1 VIEW COMPARISON:  Portable chest x-ray of August 24, 2017 FINDINGS: The lungs are adequately inflated. There is no focal infiltrate. There is no pleural effusion or pneumothorax. The cardiac silhouette is mildly enlarged. The pulmonary vascularity is normal. The endotracheal tube tip projects approximately 3 cm above the carina. The right-sided PICC line tip projects over the midportion of the SVC. There is calcification in the wall of the aortic arch. There is calcification of the mitral valvular annulus. IMPRESSION: No acute pneumonia. Stable mild cardiomegaly without pulmonary edema. The endotracheal tube placed via the tracheostomy is in reasonable position. Thoracic aortic atherosclerosis. Electronically  Signed   By: David  Martinique M.D.   On: 04/18/2018 12:18   Dg Chest Port 1v Same Day  Result Date: 04/23/2018 CLINICAL DATA:  Pneumonia. EXAM: PORTABLE CHEST 1 VIEW COMPARISON:  Radiograph of April 19, 2018. FINDINGS: Stable cardiomediastinal silhouette. Tracheostomy tube is in good position. Right-sided PICC line is unchanged in position. No pneumothorax is noted. Mild bibasilar subsegmental atelectasis is noted with minimal pleural effusions. Bony thorax is unremarkable. IMPRESSION: Mild bibasilar subsegmental atelectasis with minimal pleural effusions. Stable support apparatus. Electronically Signed   By: Marijo Conception, M.D.   On: 04/23/2018 08:04     04/19/2018 XBM:WUXL is an abnormal electroencephalogram due tooccasionalsharp activity over the  rightfrontotemporal region. This issuggestive ofa focal disturbance over right hemisphere with epileptogenic potential.   Subjective:   Discharge Exam: Vitals:   04/23/18 0811 04/23/18 0900  BP:  (!) 120/55  Pulse:  80  Resp:  12  Temp: 98.4 F (36.9 C)   SpO2:  98%   Vitals:   04/23/18 0700 04/23/18 0800 04/23/18 0811 04/23/18 0900  BP: (!) 103/48 129/61  (!) 120/55  Pulse: 66 77  80  Resp: 13 16  12   Temp:   98.4 F (36.9 C)   TempSrc:   Axillary   SpO2: 98% 99%  98%  Weight:      Height:       General exam: No acute distress Respiratory system: Anterior lung sounds with scattered rhonchi Cardiovascular system: Distant heart sounds, regular rate and rhythm, no murmurs Gastrointestinal system: Soft, nondistended, no rebound or guarding, plus bowel sounds Central nervous system: Awake and alert, intubated, left-sided hemiparesis  squeezes right hand and does wiggle right feet, torticollis towards right side noted Extremities: Lower extremity edema Skin: Tracheostomy site is clean dry and intact, PEG tube site is clean dry and intact Psychiatry: Unable to assess due to medical condition    The results of significant diagnostics from this hospitalization (including imaging, microbiology, ancillary and laboratory) are listed below for reference.     Microbiology: Recent Results (from the past 240 hour(s))  Blood Culture (routine x 2)     Status: None (Preliminary result)   Collection Time: 04/18/18 12:15 PM  Result Value Ref Range Status   Specimen Description BLOOD PICC LINE  Final   Special Requests   Final    BOTTLES DRAWN AEROBIC AND ANAEROBIC Blood Culture adequate volume   Culture   Final    NO GROWTH 4 DAYS Performed at [facility], [address], [place], [place] [address]    Report Status PENDING  Incomplete  Blood Culture (routine x 2)     Status: Abnormal   Collection Time: 04/18/18  3:05 PM  Result Value Ref Range Status   Specimen  Description   Final    BLOOD RIGHT HAND Performed at [facility] [address], [place], [place] [address]    Special Requests   Final    BOTTLES DRAWN AEROBIC ONLY Blood Culture results may not be optimal due to an inadequate volume of blood received in culture bottles Performed at [facility] [address], [place], [place] [address]    Culture  Setup Time   Final    GRAM POSITIVE COCCI AEROBIC BOTTLE ONLY CRITICAL RESULT CALLED TO, READ BACK BY AND VERIFIED WITH: L POINDEXTER PHARMD 0201 04/20/18 A BROWNING    Culture (A)  Final    STAPHYLOCOCCUS SPECIES (COAGULASE NEGATIVE) THE SIGNIFICANCE OF ISOLATING  THIS ORGANISM FROM A SINGLE SET OF BLOOD CULTURES WHEN MULTIPLE SETS ARE DRAWN IS UNCERTAIN. PLEASE NOTIFY THE MICROBIOLOGY DEPARTMENT WITHIN ONE WEEK IF SPECIATION AND SENSITIVITIES ARE REQUIRED. Performed at [facility], [place] [address], [place], [place] [address]    Report Status 04/22/2018 FINAL  Final  Blood Culture ID Panel (Reflexed)     Status: None   Collection Time: 04/18/18  3:05 PM  Result Value Ref Range Status   Enterococcus species NOT DETECTED NOT DETECTED Final   Listeria monocytogenes NOT DETECTED NOT DETECTED Final   Staphylococcus species NOT DETECTED NOT DETECTED Final   Staphylococcus aureus (BCID) NOT DETECTED NOT DETECTED Final   Streptococcus species NOT DETECTED NOT DETECTED Final   Streptococcus agalactiae NOT DETECTED NOT DETECTED Final   Streptococcus pneumoniae NOT DETECTED NOT DETECTED Final   Streptococcus pyogenes NOT DETECTED NOT DETECTED Final   Acinetobacter baumannii NOT DETECTED NOT DETECTED Final   Enterobacteriaceae species NOT DETECTED NOT DETECTED Final   Enterobacter cloacae complex NOT DETECTED NOT DETECTED Final   Escherichia coli NOT DETECTED NOT DETECTED Final   Klebsiella oxytoca NOT DETECTED NOT DETECTED Final   Klebsiella pneumoniae NOT DETECTED NOT DETECTED Final   Proteus  species NOT DETECTED NOT DETECTED Final   Serratia marcescens NOT DETECTED NOT DETECTED Final   Haemophilus influenzae NOT DETECTED NOT DETECTED Final   Neisseria meningitidis NOT DETECTED NOT DETECTED Final   Pseudomonas aeruginosa NOT DETECTED NOT DETECTED Final   Candida albicans NOT DETECTED NOT DETECTED Final   Candida glabrata NOT DETECTED NOT DETECTED Final   Candida krusei NOT DETECTED NOT DETECTED Final   Candida parapsilosis NOT DETECTED NOT DETECTED Final   Candida tropicalis NOT DETECTED NOT DETECTED Final    Comment: Performed at [facility], [place] [address] [place], [place] [address]  Urine culture     Status: None   Collection Time: 04/18/18  3:34 PM  Result Value Ref Range Status   Specimen Description   Final    URINE, RANDOM Performed at [facility] [address] [place], [place] [address]    Special Requests   Final    NONE Performed at [facility], [place] [address], [place], [place] [address]    Culture   Final    NO GROWTH Performed at [facility], [place] [address] [place], [place] [address]    Report Status 04/19/2018 FINAL  Final  Culture, respiratory (non-expectorated)     Status: None   Collection Time: 04/19/18  3:48 PM  Result Value Ref Range Status   Specimen Description   Final    TRACHEAL ASPIRATE Performed at [facility] [address], [place], [place] [address]    Special Requests   Final    NONE Performed at [facility], [place] [address] [place], [place] [address]    Gram Stain   Final    ABUNDANT WBC PRESENT,BOTH PMN AND MONONUCLEAR ABUNDANT GRAM NEGATIVE RODS Performed at [facility], [place] [address], [place],  [address]    Culture FEW PSEUDOMONAS AERUGINOSA  Final   Report Status 04/22/2018 FINAL  Final   Organism ID, Bacteria PSEUDOMONAS AERUGINOSA  Final      Susceptibility   Pseudomonas aeruginosa - MIC*    CEFTAZIDIME 8  SENSITIVE Sensitive     CIPROFLOXACIN >=4 RESISTANT Resistant     GENTAMICIN 8 INTERMEDIATE Intermediate     IMIPENEM >=16 RESISTANT Resistant  PIP/TAZO 16 SENSITIVE Sensitive     CEFEPIME 16 INTERMEDIATE Intermediate     * FEW PSEUDOMONAS AERUGINOSA  MRSA PCR Screening     Status: Abnormal   Collection Time: 04/20/18  6:08 PM  Result Value Ref Range Status   MRSA by PCR POSITIVE (A) NEGATIVE Final    Comment:        The GeneXpert MRSA Assay (FDA approved for NASAL specimens only), is one component of a comprehensive MRSA colonization surveillance program. It is not intended to diagnose MRSA infection nor to guide or monitor treatment for MRSA infections. RESULT CALLED TO, READ BACK BY AND VERIFIED WITH: B.BLACKBURN AT 2101 ON 04/20/18 BY N.THOMPSON Performed at [facility], [place] [address] [place], [place] [address]   Culture, respiratory (non-expectorated)     Status: None (Preliminary result)   Collection Time: 04/21/18  3:38 PM  Result Value Ref Range Status   Specimen Description   Final    TRACHEAL ASPIRATE Performed at [facility] [address] [place], [place] [address]    Special Requests   Final    Immunocompromised Performed at [facility], [place] [address] [place], [place] [address]    Gram Stain   Final    NO WBC SEEN FEW GRAM VARIABLE ROD Performed at [facility], [place] [address], [place], [place] [address]    Culture PENDING  Incomplete   Report Status PENDING  Incomplete     Labs: BNP (last 3 results) No results for input(s): BNP in the last 8760 hours. Basic Metabolic Panel: Recent Labs  Lab 04/18/18 2145 04/19/18 0415 04/19/18 1350 04/19/18 1654 04/20/18 0607 04/20/18 1800 04/21/18 0342 04/23/18 0425  NA 154* 151*  --   --  141  --  139 141  K 3.9 3.9  --   --  3.3*  --  3.5 4.7  CL 120* 116*  --   --  108  --  108 105  CO2 25 27  --   --  27  --  25 28  GLUCOSE 103*  103*  --   --  122*  --  109* 111*  BUN 37* 40*  --   --  36*  --  31* 32*  CREATININE 0.32* 0.32*  --   --  0.33*  --  <0.30* 0.33*  CALCIUM 8.6* 9.1  --   --  8.7*  --  8.8* 8.7*  MG 2.3 2.4  --   --  2.1  --   --  2.1  PHOS  --   --  2.4* 2.4* 2.4* 2.4*  --   --    Liver Function Tests: Recent Labs  Lab 04/18/18 1215 04/19/18 0415 04/20/18 0607 04/23/18 0425  AST 20 16 16  42*  ALT 29 22 21 15   ALKPHOS 53 45 43 50  BILITOT 0.9 1.1 0.9 1.1  PROT 6.1* 5.5* 5.0* 5.2*  ALBUMIN 2.7* 2.2* 2.0* 2.1*   No results for input(s): LIPASE, AMYLASE in the last 168 hours. Recent Labs  Lab 04/18/18 2145  AMMONIA 25   CBC: Recent Labs  Lab 04/18/18 1215 04/19/18 0415 04/20/18 0607 04/21/18 0342 04/23/18 0425  WBC 9.1 7.8 6.4 7.6 7.3  NEUTROABS 6.8  --   --   --   --   HGB 8.7* 8.9* 8.2* 8.8* 8.2*  HCT 34.1* 33.0* 29.8* 31.6* 29.6*  MCV 104.0* 100.0 97.7 95.2 98.0  PLT 186 172 151 178  160   Cardiac Enzymes: Recent Labs  Lab 04/18/18 1215 04/18/18 2145  TROPONINI 0.03* <0.03   BNP: Invalid input(s): POCBNP CBG: Recent Labs  Lab 04/22/18 1545 04/22/18 1950 04/22/18 2358 04/23/18 0357 04/23/18 0811  GLUCAP 102* 107* 108* 112* 96   D-Dimer No results for input(s): DDIMER in the last 72 hours. Hgb A1c No results for input(s): HGBA1C in the last 72 hours. Lipid Profile No results for input(s): CHOL, HDL, LDLCALC, TRIG, CHOLHDL, LDLDIRECT in the last 72 hours. Thyroid function studies No results for input(s): TSH, T4TOTAL, T3FREE, THYROIDAB in the last 72 hours.  Invalid input(s): FREET3 Anemia work up No results for input(s): VITAMINB12, FOLATE, FERRITIN, TIBC, IRON, RETICCTPCT in the last 72 hours. Urinalysis    Component Value Date/Time   COLORURINE YELLOW 04/18/2018 1534   APPEARANCEUR CLEAR 04/18/2018 1534   APPEARANCEUR Cloudy (A) 01/29/2016 1126   LABSPEC 1.020 04/18/2018 1534   LABSPEC 1.020 01/23/2014 1047   PHURINE 6.0 04/18/2018 1534   GLUCOSEU  NEGATIVE 04/18/2018 1534   GLUCOSEU NEGATIVE 01/23/2014 1047   HGBUR NEGATIVE 04/18/2018 [place] 04/18/2018 1534   BILIRUBINUR Negative 01/13/2016 1110   BILIRUBINUR NEGATIVE 01/23/2014 1047   KETONESUR NEGATIVE 04/18/2018 1534   PROTEINUR 30 (A) 04/18/2018 1534   UROBILINOGEN 0.2 08/04/2015 1140   NITRITE NEGATIVE 04/18/2018 1534   LEUKOCYTESUR NEGATIVE 04/18/2018 1534   LEUKOCYTESUR 3+ (A) 01/13/2016 1110   LEUKOCYTESUR TRACE 01/23/2014 1047   Sepsis Labs Invalid input(s): PROCALCITONIN,  WBC,  LACTICIDVEN Microbiology Recent Results (from the past 240 hour(s))  Blood Culture (routine x 2)     Status: None (Preliminary result)   Collection Time: 04/18/18 12:15 PM  Result Value Ref Range Status   Specimen Description BLOOD PICC LINE  Final   Special Requests   Final    BOTTLES DRAWN AEROBIC AND ANAEROBIC Blood Culture adequate volume   Culture   Final    NO GROWTH 4 DAYS Performed at [facility], [place] [address] [place], [place] [address]    Report Status PENDING  Incomplete  Blood Culture (routine x 2)     Status: Abnormal   Collection Time: 04/18/18  3:05 PM  Result Value Ref Range Status   Specimen Description   Final    BLOOD RIGHT HAND Performed at [facility] [address], [place], [place] [address]    Special Requests   Final    BOTTLES DRAWN AEROBIC ONLY Blood Culture results may not be optimal due to an inadequate volume of blood received in culture bottles Performed at [facility] [address], [place], [place] [address]    Culture  Setup Time   Final    GRAM POSITIVE COCCI AEROBIC BOTTLE ONLY CRITICAL RESULT CALLED TO, READ BACK BY AND VERIFIED WITH: L POINDEXTER PHARMD 0201 04/20/18 A BROWNING    Culture (A)  Final    STAPHYLOCOCCUS SPECIES (COAGULASE NEGATIVE) THE SIGNIFICANCE OF ISOLATING THIS ORGANISM FROM A SINGLE SET OF BLOOD CULTURES WHEN MULTIPLE SETS ARE DRAWN IS UNCERTAIN.  PLEASE NOTIFY THE MICROBIOLOGY DEPARTMENT WITHIN ONE WEEK IF SPECIATION AND SENSITIVITIES ARE REQUIRED. Performed at [facility], [place] [address], [place],  [address]    Report Status 04/22/2018 FINAL  Final  Blood Culture ID Panel (Reflexed)     Status: None   Collection Time: 04/18/18  3:05 PM  Result Value Ref Range Status   Enterococcus species NOT DETECTED NOT DETECTED Final  Listeria monocytogenes NOT DETECTED NOT DETECTED Final   Staphylococcus species NOT DETECTED NOT DETECTED Final   Staphylococcus aureus (BCID) NOT DETECTED NOT DETECTED Final   Streptococcus species NOT DETECTED NOT DETECTED Final   Streptococcus agalactiae NOT DETECTED NOT DETECTED Final   Streptococcus pneumoniae NOT DETECTED NOT DETECTED Final   Streptococcus pyogenes NOT DETECTED NOT DETECTED Final   Acinetobacter baumannii NOT DETECTED NOT DETECTED Final   Enterobacteriaceae species NOT DETECTED NOT DETECTED Final   Enterobacter cloacae complex NOT DETECTED NOT DETECTED Final   Escherichia coli NOT DETECTED NOT DETECTED Final   Klebsiella oxytoca NOT DETECTED NOT DETECTED Final   Klebsiella pneumoniae NOT DETECTED NOT DETECTED Final   Proteus species NOT DETECTED NOT DETECTED Final   Serratia marcescens NOT DETECTED NOT DETECTED Final   Haemophilus influenzae NOT DETECTED NOT DETECTED Final   Neisseria meningitidis NOT DETECTED NOT DETECTED Final   Pseudomonas aeruginosa NOT DETECTED NOT DETECTED Final   Candida albicans NOT DETECTED NOT DETECTED Final   Candida glabrata NOT DETECTED NOT DETECTED Final   Candida krusei NOT DETECTED NOT DETECTED Final   Candida parapsilosis NOT DETECTED NOT DETECTED Final   Candida tropicalis NOT DETECTED NOT DETECTED Final    Comment: Performed at [facility], [place] [address], [place], [place] [address]  Urine culture     Status: None   Collection Time: 04/18/18  3:34 PM  Result Value Ref Range Status   Specimen Description   Final    URINE,  RANDOM Performed at [place] [address], [place], [place] [address]    Special Requests   Final    NONE Performed at [facility], [place] [address] [place], [place] [address]    Culture   Final    NO GROWTH Performed at [facility], [place] [address] [place], [place] [address]    Report Status 04/19/2018 FINAL  Final  Culture, respiratory (non-expectorated)     Status: None   Collection Time: 04/19/18  3:48 PM  Result Value Ref Range Status   Specimen Description   Final    TRACHEAL ASPIRATE Performed at [facility] [address], [place], [place] [address]    Special Requests   Final    NONE Performed at [facility], [place] [address] [place], [place] [address]    Gram Stain   Final    ABUNDANT WBC PRESENT,BOTH PMN AND MONONUCLEAR ABUNDANT GRAM NEGATIVE RODS Performed at [facility], [place] [address] [place], [place] [address]    Culture FEW PSEUDOMONAS AERUGINOSA  Final   Report Status 04/22/2018 FINAL  Final   Organism ID, Bacteria PSEUDOMONAS AERUGINOSA  Final      Susceptibility   Pseudomonas aeruginosa - MIC*    CEFTAZIDIME 8 SENSITIVE Sensitive     CIPROFLOXACIN >=4 RESISTANT Resistant     GENTAMICIN 8 INTERMEDIATE Intermediate     IMIPENEM >=16 RESISTANT Resistant     PIP/TAZO 16 SENSITIVE Sensitive     CEFEPIME 16 INTERMEDIATE Intermediate     * FEW PSEUDOMONAS AERUGINOSA  MRSA PCR Screening     Status: Abnormal   Collection Time: 04/20/18  6:08 PM  Result Value Ref Range Status   MRSA by PCR POSITIVE (A) NEGATIVE Final    Comment:        The GeneXpert MRSA Assay (FDA approved for NASAL specimens only), is one component of a comprehensive MRSA colonization surveillance program. It is not  intended to diagnose MRSA infection nor to guide or monitor treatment for MRSA infections. RESULT CALLED TO, READ BACK BY AND VERIFIED WITH: B.BLACKBURN AT 2101 ON 04/20/18  BY N.THOMPSON Performed at [facility], [place] [address] [place], [place] [address]   Culture, respiratory (non-expectorated)     Status: None (Preliminary result)   Collection Time: 04/21/18  3:38 PM  Result Value Ref Range Status   Specimen Description   Final    TRACHEAL ASPIRATE Performed at [place] [address], [place], [place] [address]    Special Requests   Final    Immunocompromised Performed at [facility], [place] [address] [place], [place] [address]    Gram Stain   Final    NO WBC SEEN FEW GRAM VARIABLE ROD Performed at [facility], [place] [address], [place],  [address]    Culture PENDING  Incomplete   Report Status PENDING  Incomplete     Time coordinating discharge: 63  SIGNED:   Cristy Folks, MD  [facility] 04/23/2018, 9:58 AM  If 7PM-7AM, please contact night-coverage [email or web address] Password [email or web address]

## 2018-04-23 NOTE — Progress Notes (Addendum)
PROGRESS NOTE    Melissa Cochran  ZOX:[id number] DOB: August 06, 1935 DOA: 04/18/2018 PCP: [name], MD   Brief Narrative:  82 year old with past medical history relevant for chronic atrial fibrillation on apixaban, hypertension, coronary artery disease status post stent in 8119, chronic systolic heart failure with EF of 45 to 50% by echo on 06/28/2017, nephrolithiasis complicated by hydronephrosis status post stent placement in 2017, history of bilateral hydronephrosis, CVA, seizure disorder complicated by chronic respiratory failure with ventilator dependence and PEG tube placement who was admitted from [facility] for lethargy and electrolyte imbalances.   Assessment & Plan:   Principal Problem:   Acute encephalopathy Active Problems:   Essential (primary) hypertension   History of cardiac catheterization   Hyperlipidemia   DVT (deep venous thrombosis) (HCC)   Seizures (HCC)   A-fib (HCC)   CVA (cerebral vascular accident) ([place])   Chronic respiratory failure ([place])   Acute respiratory failure with hypoxia ([place])   #) Acute metabolic encephalopathy:   her baseline is unclear though reportedly she was able to speak using a Passy-Muir valve at [facility] and was working on swallowing.  Her encephalopathy has almost completely resolved.  At this time it seems to be likely secondary to decreasing levels are antiepileptics due to meropenem given at [facility] for possible hospital-acquired pneumonia as well as electrolyte abnormalities including hyERonatremia. - EEG on 04/19/2018 shows occasional sharp activity over right frontotemporal region which neurology is attributing to a meningioma -Neurology following, appreciate recommendations - B12 and folate replete, RPR negative - Continue antiepileptics per below -Brain MRI shows no acute process but does show old stroke and meningioma - Patient was at baseline on 04/23/2018 unfortunately she has run out of Medicare days and so cannot go  back to [facility]  #) Torticollis: -Physical therapy consult  #) Possible pneumonia/bacterial tracheitis: Patient reportedly was treated for pneumonia at [facility] with meropenem and vancomycin.  There is no culture results from this that of return.  Of note there was a concern that meropenem could decrease the valproic acid level and cause patient to have a seizure.  At this time the relevance of this respiratory culture from 04/19/2018 growing Pseudomonas is unclear particularly the patient does not have any other infectious symptoms and a procalcitonin is quite low. - blood cultures on 04/18/2018 growing 1 out of 2 coagulase-negative staph --Respiratory culture from 04/19/2018 growing Pseudomonas -Repeat respiratory culture from 04/21/2018 no growth to date - Procalcitonin was undetectable on admission  #) Seizure disorder: -Restart levetiracetam thousand milligrams twice daily by tube -Restart valproic acid 250 mg twice daily by tube - Discontinued levetiracetam thousand milligrams IV twice daily -Discontinue IV valproic acid 250 mg twice daily - Valproic acid level low at 13, levetiracetam level 39.4  #) Chronic respiratory failure status post tracheostomy: -PCCM following, appreciate t recommendations  #) Hypernatremia with PEG tube dependence: Resolved with free water flushes and D5W -Continue free water flushes 200 mL's every 8 hours - Vital 1.5 @ 40 mL/hr with 30 mL Prostat BID  #) Chronic atrial fibrillation complicated by CVA: -Continue beta-blocker -Continue apixaban 2.5 mg twice daily  #) Hypertension/hyperlipidemia/coronary artery disease status post stent: -Continue aspirin 81 mg -Continue carvedilol 6.25 mg twice daily  #) Anemia: This appears to be stable at this time.  Suspect most likely iron deficiency anemia and anemia of chronic disease.  #) Nephrolithiasis/hydronephrosis status post stents: Currently her creatinine is normal. -Low threshold to repeat CT abdomen     #) Chronic systolic  heart failure: Last echo in February showed EF of 40 to 45% -Continue furosemide 40 mg daily via tube  Fluids: Free water flushes Electrolytes: Per above Nutrition: Per above  Prophylaxis: Apixaban  Disposition: Pending finding of LTAC  Full code  Consultants:   PCCM  Neurology  Procedures:   04/19/2018 HUD:JSHF is an abnormal electroencephalogram due to occasional sharp activity over the right frontotemporal region. This is suggestive of a focal disturbance over right hemisphere with epileptogenic potential.  04/22/2018 EEG: Pending  Antimicrobials:   None   Subjective: This morning patient does awake to arousal.  She reports she is feeling fine.  She does follow commands on her right side.  Objective: Vitals:   04/23/18 1300 04/23/18 1400 04/23/18 1500 04/23/18 1600  BP: (!) 92/41 (!) 107/55 109/61 (!) 109/52  Pulse: 71 75 79 79  Resp: 13 (!) 24 (!) 25 16  Temp:      TempSrc:      SpO2: 99% 100% 100% 98%  Weight:      Height:        Intake/Output Summary (Last 24 hours) at 04/23/2018 1650 Last data filed at 04/23/2018 1600 Gross per 24 hour  Intake 1560.74 ml  Output 865 ml  Net 695.74 ml   Filed Weights   04/21/18 0500 04/22/18 0500 04/23/18 0116  Weight: 69.1 kg 69.1 kg 70.7 kg    Examination:  General exam: No acute distress Respiratory system: Anterior lung sounds with scattered rhonchi Cardiovascular system: Distant heart sounds, regular rate and rhythm, no murmurs Gastrointestinal system: Soft, nondistended, no rebound or guarding, plus bowel sounds Central nervous system: Awake and appears to be oriented though cannot assess completely, left-sided hemiparesis, squeezes right hand and wiggle right feet, torticollis towards right side noted Extremities: Lower extremity edema Skin: Tracheostomy site is clean Cochran and intact, PEG tube site is clean Cochran and intact Psychiatry: Unable to assess due to medical  condition    Data Reviewed: I have personally reviewed following labs and imaging studies  CBC: Recent Labs  Lab 04/18/18 1215 04/19/18 0415 04/20/18 0607 04/21/18 0342 04/23/18 0425  WBC 9.1 7.8 6.4 7.6 7.3  NEUTROABS 6.8  --   --   --   --   HGB 8.7* 8.9* 8.2* 8.8* 8.2*  HCT 34.1* 33.0* 29.8* 31.6* 29.6*  MCV 104.0* 100.0 97.7 95.2 98.0  PLT 186 172 151 178 026   Basic Metabolic Panel: Recent Labs  Lab 04/18/18 2145 04/19/18 0415 04/19/18 1350 04/19/18 1654 04/20/18 0607 04/20/18 1800 04/21/18 0342 04/23/18 0425  NA 154* 151*  --   --  141  --  139 141  K 3.9 3.9  --   --  3.3*  --  3.5 4.7  CL 120* 116*  --   --  108  --  108 105  CO2 25 27  --   --  27  --  25 28  GLUCOSE 103* 103*  --   --  122*  --  109* 111*  BUN 37* 40*  --   --  36*  --  31* 32*  CREATININE 0.32* 0.32*  --   --  0.33*  --  <0.30* 0.33*  CALCIUM 8.6* 9.1  --   --  8.7*  --  8.8* 8.7*  MG 2.3 2.4  --   --  2.1  --   --  2.1  PHOS  --   --  2.4* 2.4* 2.4* 2.4*  --   --  GFR: Estimated Creatinine Clearance: 54.1 mL/min (A) (by C-G formula based on SCr of 0.33 mg/dL (L)). Liver Function Tests: Recent Labs  Lab 04/18/18 1215 04/19/18 0415 04/20/18 0607 04/23/18 0425  AST _0 42*  ALT _1 ALKPHOS 53 45 43 50  BILITOT 0.9 1.1 0.9 1.1  PROT 6.1* 5.5* 5.0* 5.2*  ALBUMIN 2.7* 2.2* 2.0* 2.1*   No results for input(s): LIPASE, AMYLASE in the last 168 hours. Recent Labs  Lab 04/18/18 2145  AMMONIA 25   Coagulation Profile: No results for input(s): INR, PROTIME in the last 168 hours. Cardiac Enzymes: Recent Labs  Lab 04/18/18 1215 04/18/18 2145  TROPONINI 0.03* <0.03   BNP (last 3 results) No results for input(s): PROBNP in the last 8760 hours. HbA1C: No results for input(s): HGBA1C in the last 72 hours. CBG: Recent Labs  Lab 04/22/18 1950 04/22/18 2358 04/23/18 0357 04/23/18 0811 04/23/18 1136  GLUCAP 107* 108* 112* 96 118*   Lipid Profile: No results  for input(s): CHOL, HDL, LDLCALC, TRIG, CHOLHDL, LDLDIRECT in the last 72 hours. Thyroid Function Tests: No results for input(s): TSH, T4TOTAL, FREET4, T3FREE, THYROIDAB in the last 72 hours. Anemia Panel: No results for input(s): VITAMINB12, FOLATE, FERRITIN, TIBC, IRON, RETICCTPCT in the last 72 hours. Sepsis Labs: Recent Labs  Lab 04/18/18 1222 04/18/18 2145 04/19/18 0415 04/20/18 0607  PROCALCITON  --  <0.10 <0.10 <0.10  LATICACIDVEN 1.46  --   --   --     Recent Results (from the past 240 hour(s))  Blood Culture (routine x 2)     Status: None (Preliminary result)   Collection Time: 04/18/18 12:15 PM  Result Value Ref Range Status   Specimen Description BLOOD PICC LINE  Final   Special Requests   Final    BOTTLES DRAWN AEROBIC AND ANAEROBIC Blood Culture adequate volume   Culture   Final    NO GROWTH 4 DAYS Performed at [facility], [place] [address] [place], [place] [address]    Report Status PENDING  Incomplete  Blood Culture (routine x 2)     Status: Abnormal   Collection Time: 04/18/18  3:05 PM  Result Value Ref Range Status   Specimen Description   Final    BLOOD RIGHT HAND Performed at [place] [address] [place], [place] [address]    Special Requests   Final    BOTTLES DRAWN AEROBIC ONLY Blood Culture results may not be optimal due to an inadequate volume of blood received in culture bottles Performed at [facility] [address] [place], [place] [address]    Culture  Setup Time   Final    GRAM POSITIVE COCCI AEROBIC BOTTLE ONLY CRITICAL RESULT CALLED TO, READ BACK BY AND VERIFIED WITH: L POINDEXTER PHARMD 0201 04/20/18 A BROWNING    Culture (A)  Final    STAPHYLOCOCCUS SPECIES (COAGULASE NEGATIVE) THE SIGNIFICANCE OF ISOLATING THIS ORGANISM FROM A SINGLE SET OF BLOOD CULTURES WHEN MULTIPLE SETS ARE DRAWN IS UNCERTAIN. PLEASE NOTIFY THE MICROBIOLOGY DEPARTMENT WITHIN ONE WEEK IF SPECIATION AND  SENSITIVITIES ARE REQUIRED. Performed at [facility], [name] [address], [place], [place] [address]    Report Status 04/22/2018 FINAL  Final  Blood Culture ID Panel (Reflexed)     Status: None   Collection Time: 04/18/18  3:05 PM  Result Value Ref Range Status   Enterococcus species NOT DETECTED NOT DETECTED Final   Listeria monocytogenes NOT  DETECTED NOT DETECTED Final   Staphylococcus species NOT DETECTED NOT DETECTED Final   Staphylococcus aureus (BCID) NOT DETECTED NOT DETECTED Final   Streptococcus species NOT DETECTED NOT DETECTED Final   Streptococcus agalactiae NOT DETECTED NOT DETECTED Final   Streptococcus pneumoniae NOT DETECTED NOT DETECTED Final   Streptococcus pyogenes NOT DETECTED NOT DETECTED Final   Acinetobacter baumannii NOT DETECTED NOT DETECTED Final   Enterobacteriaceae species NOT DETECTED NOT DETECTED Final   Enterobacter cloacae complex NOT DETECTED NOT DETECTED Final   Escherichia coli NOT DETECTED NOT DETECTED Final   Klebsiella oxytoca NOT DETECTED NOT DETECTED Final   Klebsiella pneumoniae NOT DETECTED NOT DETECTED Final   Proteus species NOT DETECTED NOT DETECTED Final   Serratia marcescens NOT DETECTED NOT DETECTED Final   Haemophilus influenzae NOT DETECTED NOT DETECTED Final   Neisseria meningitidis NOT DETECTED NOT DETECTED Final   Pseudomonas aeruginosa NOT DETECTED NOT DETECTED Final   Candida albicans NOT DETECTED NOT DETECTED Final   Candida glabrata NOT DETECTED NOT DETECTED Final   Candida krusei NOT DETECTED NOT DETECTED Final   Candida parapsilosis NOT DETECTED NOT DETECTED Final   Candida tropicalis NOT DETECTED NOT DETECTED Final    Comment: Performed at [facility], [place] [address] [place], [place] [address]  Urine culture     Status: None   Collection Time: 04/18/18  3:34 PM  Result Value Ref Range Status   Specimen Description   Final    URINE, RANDOM Performed at [facility] [address],  [place], [place] [address]    Special Requests   Final    NONE Performed at [facility], [place] [address] [place], [place] [address]    Culture   Final    NO GROWTH Performed at [facility], [place] [address] [place], [place] [address]    Report Status 04/19/2018 FINAL  Final  Culture, respiratory (non-expectorated)     Status: None   Collection Time: 04/19/18  3:48 PM  Result Value Ref Range Status   Specimen Description   Final    TRACHEAL ASPIRATE Performed at [facility] [address] [place], [place] [address]    Special Requests   Final    NONE Performed at [facility], [place] [address] [place], [place] [address]    Gram Stain   Final    ABUNDANT WBC PRESENT,BOTH PMN AND MONONUCLEAR ABUNDANT GRAM NEGATIVE RODS Performed at [facility], [place] [address], [place], [place] [address]    Culture FEW PSEUDOMONAS AERUGINOSA  Final   Report Status 04/22/2018 FINAL  Final   Organism ID, Bacteria PSEUDOMONAS AERUGINOSA  Final      Susceptibility   Pseudomonas aeruginosa - MIC*    CEFTAZIDIME 8 SENSITIVE Sensitive     CIPROFLOXACIN >=4 RESISTANT Resistant     GENTAMICIN 8 INTERMEDIATE Intermediate     IMIPENEM >=16 RESISTANT Resistant     PIP/TAZO 16 SENSITIVE Sensitive     CEFEPIME 16 INTERMEDIATE Intermediate     * FEW PSEUDOMONAS AERUGINOSA  MRSA PCR Screening     Status: Abnormal   Collection Time: 04/20/18  6:08 PM  Result Value Ref Range Status   MRSA by PCR POSITIVE (A) NEGATIVE Final    Comment:        The GeneXpert MRSA Assay (FDA approved for NASAL specimens only), is one component of a comprehensive MRSA colonization surveillance program. It is not intended to diagnose  MRSA infection nor to guide or monitor treatment for MRSA infections. RESULT CALLED TO, READ BACK BY AND VERIFIED WITH: B.BLACKBURN AT 2101 ON 04/20/18 BY N.THOMPSON Performed at [facility], [place] [address], [place], [place] [address]   Culture, respiratory (non-expectorated)     Status: None (Preliminary result)   Collection Time: 04/21/18  3:38 PM  Result Value Ref Range Status   Specimen Description   Final    TRACHEAL ASPIRATE Performed at [facility] [address], [place], [place] [address]    Special Requests   Final    Immunocompromised Performed at [facility], [place] [address] [place], [place] [address]    Gram Stain   Final    NO WBC SEEN FEW GRAM VARIABLE ROD Performed at [facility], [place] [address], [place], [place] [address]    Culture ABUNDANT PSEUDOMONAS AERUGINOSA  Final   Report Status PENDING  Incomplete         Radiology Studies: Dg Chest Port 1v Same Day  Result Date: 04/23/2018 CLINICAL DATA:  Pneumonia. EXAM: PORTABLE CHEST 1 VIEW COMPARISON:  Radiograph of April 19, 2018. FINDINGS: Stable cardiomediastinal silhouette. Tracheostomy tube is in good position. Right-sided PICC line is unchanged in position. No pneumothorax is noted. Mild bibasilar subsegmental atelectasis is noted with minimal pleural effusions. Bony thorax is unremarkable. IMPRESSION: Mild bibasilar subsegmental atelectasis with minimal pleural effusions. Stable support apparatus. Electronically Signed   By: Marijo Conception, M.D.   On: 04/23/2018 08:04        Scheduled Meds: . apixaban  2.5 mg Per Tube BID  . aspirin  81 mg Oral Daily  . carvedilol  6.25 mg Oral BID WC  . chlorhexidine gluconate (MEDLINE KIT)  15 mL Mouth Rinse BID  . Chlorhexidine Gluconate Cloth  6 each Topical Daily  . collagenase   Topical Daily  . famotidine  20 mg Oral QHS  . feeding supplement (PRO-STAT SUGAR FREE 64)  30 mL Per Tube Daily  . feeding supplement (VITAL HIGH PROTEIN)  1,000 mL Per Tube Q24H  . free water  200 mL Per Tube Q8H  . furosemide  40 mg Oral Daily  . levETIRAcetam  1,000 mg Oral BID  . mouth rinse  15 mL Mouth Rinse 10 times per day  .  mupirocin ointment  1 application Nasal BID  . pantoprazole sodium  40 mg Per Tube Daily  . sodium chloride flush  10-40 mL Intracatheter Q12H  . sodium chloride flush  3 mL Intravenous Q12H  . Valproate Sodium  500 mg Oral BID   Continuous Infusions:    LOS: 5 days    Time spent: [place], MD [facility]  If 7PM-7AM, please contact night-coverage [email or web address] Password [email or web address] 04/23/2018, 4:50 PM

## 2018-04-24 ENCOUNTER — Ambulatory Visit: Payer: Self-pay | Admitting: *Deleted

## 2018-04-24 DIAGNOSIS — I639 Cerebral infarction, unspecified: Secondary | ICD-10-CM

## 2018-04-24 DIAGNOSIS — I10 Essential (primary) hypertension: Secondary | ICD-10-CM

## 2018-04-24 DIAGNOSIS — R569 Unspecified convulsions: Secondary | ICD-10-CM

## 2018-04-24 LAB — GLUCOSE, CAPILLARY
GLUCOSE-CAPILLARY: 102 mg/dL — AB (ref 70–99)
Glucose-Capillary: 102 mg/dL — ABNORMAL HIGH (ref 70–99)
Glucose-Capillary: 103 mg/dL — ABNORMAL HIGH (ref 70–99)
Glucose-Capillary: 106 mg/dL — ABNORMAL HIGH (ref 70–99)
Glucose-Capillary: 117 mg/dL — ABNORMAL HIGH (ref 70–99)
Glucose-Capillary: 94 mg/dL (ref 70–99)
Glucose-Capillary: 95 mg/dL (ref 70–99)
Glucose-Capillary: 96 mg/dL (ref 70–99)
Glucose-Capillary: 97 mg/dL (ref 70–99)

## 2018-04-24 LAB — CULTURE, RESPIRATORY W GRAM STAIN: Gram Stain: NONE SEEN

## 2018-04-24 LAB — C DIFFICILE QUICK SCREEN W PCR REFLEX
C Diff antigen: POSITIVE — AB
C Diff toxin: NEGATIVE

## 2018-04-24 MED ORDER — ADULT MULTIVITAMIN LIQUID CH
15.0000 mL | Freq: Every day | ORAL | Status: DC
  Administered 2018-04-24 – 2018-05-02 (×9): 15 mL
  Filled 2018-04-24 (×9): qty 15

## 2018-04-24 MED ORDER — VITAL HIGH PROTEIN PO LIQD
1000.0000 mL | ORAL | Status: DC
  Administered 2018-04-24 – 2018-05-02 (×8): 1000 mL

## 2018-04-24 MED ORDER — JUVEN PO PACK
1.0000 | PACK | Freq: Two times a day (BID) | ORAL | Status: DC
  Administered 2018-04-24 – 2018-05-02 (×17): 1
  Filled 2018-04-24 (×17): qty 1

## 2018-04-24 MED ORDER — PRO-STAT SUGAR FREE PO LIQD
30.0000 mL | Freq: Two times a day (BID) | ORAL | Status: DC
  Administered 2018-04-24 – 2018-05-02 (×16): 30 mL
  Filled 2018-04-24 (×15): qty 30

## 2018-04-24 NOTE — Progress Notes (Signed)
[facility] PROGRESS NOTE  Melissa Cochran QHK:[id number] DOB: 03-Jun-1935 DOA: 04/18/2018 PCP: [name], MD  Brief summary   82 year old with past medical history relevant for chronic atrial fibrillation on apixaban, hypertension, coronary artery disease status post stent in 3582, chronic systolic heart failure with EF of 45 to 50% by echo on 06/28/2017, nephrolithiasis complicated by hydronephrosis status post stent placement in 2017, history of bilateral hydronephrosis, CVA, seizure disorder complicated by chronic respiratory failure with ventilator dependence and PEG tube placement who was admitted from [facility] for lethargy and electrolyte imbalances.  Assessment/Plan:  Acute metabolic encephalopathy:   her baseline is unclear though reportedly she was able to speak using a Passy-Muir valve at [facility] and was working on swallowing.  Her encephalopathy has almost completely resolved.  At this time it seems to be likely secondary to decreasing levels are antiepileptics due to meropenem given at [facility] for possible hospital-acquired pneumonia as well as electrolyte abnormalities including hyERonatremia. -EEG on 04/19/2018 shows occasional sharp activity over right frontotemporal region which neurology is attributing to a meningioma. B12 and folate replete, RPR negative. Continue antiepileptics per below. Brain MRI shows no acute process but does show old stroke and meningioma.  -unfortunately she has run out of Medicare days and so cannot go back to [facility]. Awaiting placement   Possible recent pneumonia/bacterial tracheitis: Patient reportedly was treated for pneumonia at [facility] with meropenem and vancomycin.  There is no culture results from this that of return.  Of note there was a concern that meropenem could decrease the valproic acid level and cause patient to have a seizure.  At this time the relevance of this respiratory culture from 04/19/2018 growing Pseudomonas is  unclear particularly the patient does not have any other infectious symptoms and a procalcitonin is quite low. blood cultures on 04/18/2018 growing 1 out of 2 coagulase-negative staph. Respiratory culture from 04/19/2018 growing Pseudomonas. Repeat respiratory culture from 04/21/2018 no growth to date. Procalcitonin was undetectable on admission  Seizure disorder: Restart levetiracetam thousand milligrams twice daily by tube. Restart valproic acid 250 mg twice daily by tube  Torticollis. Con physical therapy  Chronic respiratory failure status post tracheostomy: PCCM following, appreciate t recommendations  Hypernatremia with PEG tube dependence: Resolved with free water flushes and D5W. Continue free water flushes 200 mL's every 8 hours. Vital 1.5 @ 40 mL/hr with 30 mL Prostat BID  Chronic atrial fibrillation complicated by CVA:. Continue beta-blocker. Continue apixaban 2.5 mg twice daily  Hypertension/hyperlipidemia/coronary artery disease status post stent: Continue aspirin 81 mg. continue carvedilol 6.25 mg twice daily  Anemia: This appears to be stable at this time.  Suspect most likely iron deficiency anemia and anemia of chronic disease.  Nephrolithiasis/hydronephrosis status post stents: Currently her creatinine is normal. Low threshold to repeat CT abdomen   Chronic systolic heart failure: Last echo in February showed EF of 40 to 45%. continue furosemide 40 mg daily via tube  Code Status: full Family Communication:  D/w patient, RN (indicate person spoken with, relationship, and if by phone, the number) Disposition Plan: pend LTEC placement    Consultants:  PCCM  Neurology  Procedures:  04/19/2018 PPG:FQMK is an abnormal electroencephalogram due tooccasionalsharp activity over the rightfrontotemporal region. This issuggestive ofa focal disturbance over right hemisphere with epileptogenic potential.  04/22/2018 EEG:   Antibiotics: Anti-infectives (From  admission, onward)   Start     Dose/Rate Route Frequency Ordered Stop   04/23/18 0800  piperacillin-tazobactam (ZOSYN) IVPB 3.375 g  Status:  Discontinued     3.375 g 12.5 mL/hr over 240 Minutes Intravenous Every 8 hours 04/23/18 0726 04/23/18 0951   04/23/18 0730  piperacillin-tazobactam (ZOSYN) IVPB 3.375 g  Status:  Discontinued     3.375 g 100 mL/hr over 30 Minutes Intravenous Every 6 hours 04/23/18 0723 04/23/18 0726        (indicate start date, and stop date if known)  HPI/Subjective: Follows commands minimally. No acute distress. Stable on vent. Afebrile   Objective: Vitals:   04/24/18 0600 04/24/18 0800  BP: (!) 97/53   Pulse: 73   Resp: 20   Temp:  98.7 F (37.1 C)  SpO2: 97% 96%    Intake/Output Summary (Last 24 hours) at 04/24/2018 0832 Last data filed at 04/24/2018 0644 Gross per 24 hour  Intake 1402.5 ml  Output 1010 ml  Net 392.5 ml   Filed Weights   04/22/18 0500 04/23/18 0116 04/24/18 0113  Weight: 69.1 kg 70.7 kg 70.1 kg    Exam:   General:  No distress   Cardiovascular: s1,s2 rrr  Respiratory: no wheezing   Abdomen: soft, NT  Musculoskeletal: decub ulcers.    Data Reviewed: Basic Metabolic Panel: Recent Labs  Lab 04/18/18 2145 04/19/18 0415 04/19/18 1350 04/19/18 1654 04/20/18 0607 04/20/18 1800 04/21/18 0342 04/23/18 0425  NA 154* 151*  --   --  141  --  139 141  K 3.9 3.9  --   --  3.3*  --  3.5 4.7  CL 120* 116*  --   --  108  --  108 105  CO2 25 27  --   --  27  --  25 28  GLUCOSE 103* 103*  --   --  122*  --  109* 111*  BUN 37* 40*  --   --  36*  --  31* 32*  CREATININE 0.32* 0.32*  --   --  0.33*  --  <0.30* 0.33*  CALCIUM 8.6* 9.1  --   --  8.7*  --  8.8* 8.7*  MG 2.3 2.4  --   --  2.1  --   --  2.1  PHOS  --   --  2.4* 2.4* 2.4* 2.4*  --   --    Liver Function Tests: Recent Labs  Lab 04/18/18 1215 04/19/18 0415 04/20/18 0607 04/23/18 0425  AST _0 42*  ALT _1 ALKPHOS 53 45 43 50  BILITOT 0.9  1.1 0.9 1.1  PROT 6.1* 5.5* 5.0* 5.2*  ALBUMIN 2.7* 2.2* 2.0* 2.1*   No results for input(s): LIPASE, AMYLASE in the last 168 hours. Recent Labs  Lab 04/18/18 2145  AMMONIA 25   CBC: Recent Labs  Lab 04/18/18 1215 04/19/18 0415 04/20/18 0607 04/21/18 0342 04/23/18 0425  WBC 9.1 7.8 6.4 7.6 7.3  NEUTROABS 6.8  --   --   --   --   HGB 8.7* 8.9* 8.2* 8.8* 8.2*  HCT 34.1* 33.0* 29.8* 31.6* 29.6*  MCV 104.0* 100.0 97.7 95.2 98.0  PLT 186 172 151 178 160   Cardiac Enzymes: Recent Labs  Lab 04/18/18 1215 04/18/18 2145  TROPONINI 0.03* <0.03   BNP (last 3 results) No results for input(s): BNP in the last 8760 hours.  ProBNP (last 3 results) No results for input(s): PROBNP in the last 8760 hours.  CBG: Recent Labs  Lab 04/23/18 1136 04/23/18 1523 04/23/18 2014 04/24/18 0336 04/24/18 0739  GLUCAP 118* 102* 103* 102* 97  Recent Results (from the past 240 hour(s))  Blood Culture (routine x 2)     Status: None (Preliminary result)   Collection Time: 04/18/18 12:15 PM  Result Value Ref Range Status   Specimen Description BLOOD PICC LINE  Final   Special Requests   Final    BOTTLES DRAWN AEROBIC AND ANAEROBIC Blood Culture adequate volume   Culture   Final    NO GROWTH 4 DAYS Performed at [facility], [address], [place], [place] [address]    Report Status PENDING  Incomplete  Blood Culture (routine x 2)     Status: Abnormal   Collection Time: 04/18/18  3:05 PM  Result Value Ref Range Status   Specimen Description   Final    BLOOD RIGHT HAND Performed at [facility] [address] [place], [place] [address]    Special Requests   Final    BOTTLES DRAWN AEROBIC ONLY Blood Culture results may not be optimal due to an inadequate volume of blood received in culture bottles Performed at [facility] [address] [place], [place] [address]    Culture  Setup Time   Final    GRAM POSITIVE COCCI AEROBIC BOTTLE  ONLY CRITICAL RESULT CALLED TO, READ BACK BY AND VERIFIED WITH: L POINDEXTER PHARMD 0201 04/20/18 A BROWNING    Culture (A)  Final    STAPHYLOCOCCUS SPECIES (COAGULASE NEGATIVE) THE SIGNIFICANCE OF ISOLATING THIS ORGANISM FROM A SINGLE SET OF BLOOD CULTURES WHEN MULTIPLE SETS ARE DRAWN IS UNCERTAIN. PLEASE NOTIFY THE MICROBIOLOGY DEPARTMENT WITHIN ONE WEEK IF SPECIATION AND SENSITIVITIES ARE REQUIRED. Performed at [facility], [place] [address] [place], [place] [address]    Report Status 04/22/2018 FINAL  Final  Blood Culture ID Panel (Reflexed)     Status: None   Collection Time: 04/18/18  3:05 PM  Result Value Ref Range Status   Enterococcus species NOT DETECTED NOT DETECTED Final   Listeria monocytogenes NOT DETECTED NOT DETECTED Final   Staphylococcus species NOT DETECTED NOT DETECTED Final   Staphylococcus aureus (BCID) NOT DETECTED NOT DETECTED Final   Streptococcus species NOT DETECTED NOT DETECTED Final   Streptococcus agalactiae NOT DETECTED NOT DETECTED Final   Streptococcus pneumoniae NOT DETECTED NOT DETECTED Final   Streptococcus pyogenes NOT DETECTED NOT DETECTED Final   Acinetobacter baumannii NOT DETECTED NOT DETECTED Final   Enterobacteriaceae species NOT DETECTED NOT DETECTED Final   Enterobacter cloacae complex NOT DETECTED NOT DETECTED Final   Escherichia coli NOT DETECTED NOT DETECTED Final   Klebsiella oxytoca NOT DETECTED NOT DETECTED Final   Klebsiella pneumoniae NOT DETECTED NOT DETECTED Final   Proteus species NOT DETECTED NOT DETECTED Final   Serratia marcescens NOT DETECTED NOT DETECTED Final   Haemophilus influenzae NOT DETECTED NOT DETECTED Final   Neisseria meningitidis NOT DETECTED NOT DETECTED Final   Pseudomonas aeruginosa NOT DETECTED NOT DETECTED Final   Candida albicans NOT DETECTED NOT DETECTED Final   Candida glabrata NOT DETECTED NOT DETECTED Final   Candida krusei NOT DETECTED NOT DETECTED Final   Candida parapsilosis NOT DETECTED NOT  DETECTED Final   Candida tropicalis NOT DETECTED NOT DETECTED Final    Comment: Performed at [facility], [place] [address] [place], [place] [address]  Urine culture     Status: None   Collection Time: 04/18/18  3:34 PM  Result Value Ref Range Status   Specimen Description   Final    URINE, RANDOM Performed at  [facility], [place] [address] [place], [place] [address]    Special Requests   Final    NONE Performed at [facility], [place] [address], [place], [place] [address]    Culture   Final    NO GROWTH Performed at [facility], [place] [address], [place], [place] [address]    Report Status 04/19/2018 FINAL  Final  Culture, respiratory (non-expectorated)     Status: None   Collection Time: 04/19/18  3:48 PM  Result Value Ref Range Status   Specimen Description   Final    TRACHEAL ASPIRATE Performed at [facility] [address] [place], [place] [address]    Special Requests   Final    NONE Performed at [facility], [place] [address], [place], [place] [address]    Gram Stain   Final    ABUNDANT WBC PRESENT,BOTH PMN AND MONONUCLEAR ABUNDANT GRAM NEGATIVE RODS Performed at [facility], [place] [address] [place], [place] [address]    Culture FEW PSEUDOMONAS AERUGINOSA  Final   Report Status 04/22/2018 FINAL  Final   Organism ID, Bacteria PSEUDOMONAS AERUGINOSA  Final      Susceptibility   Pseudomonas aeruginosa - MIC*    CEFTAZIDIME 8 SENSITIVE Sensitive     CIPROFLOXACIN >=4 RESISTANT Resistant     GENTAMICIN 8 INTERMEDIATE Intermediate     IMIPENEM >=16 RESISTANT Resistant     PIP/TAZO 16 SENSITIVE Sensitive     CEFEPIME 16 INTERMEDIATE Intermediate     * FEW PSEUDOMONAS AERUGINOSA  MRSA PCR Screening     Status: Abnormal   Collection Time: 04/20/18  6:08 PM  Result Value Ref Range Status   MRSA by PCR POSITIVE (A) NEGATIVE Final    Comment:        The GeneXpert MRSA Assay  (FDA approved for NASAL specimens only), is one component of a comprehensive MRSA colonization surveillance program. It is not intended to diagnose MRSA infection nor to guide or monitor treatment for MRSA infections. RESULT CALLED TO, READ BACK BY AND VERIFIED WITH: B.BLACKBURN AT 2101 ON 04/20/18 BY N.THOMPSON Performed at [facility], [place] [address], [place], [place] [address]   Culture, respiratory (non-expectorated)     Status: None (Preliminary result)   Collection Time: 04/21/18  3:38 PM  Result Value Ref Range Status   Specimen Description   Final    TRACHEAL ASPIRATE Performed at [facility] [address] [place], [place] [address]    Special Requests   Final    Immunocompromised Performed at [facility], [place] [address] [place], [place] [address]    Gram Stain   Final    NO WBC SEEN FEW GRAM VARIABLE ROD Performed at [facility], [place] [address] [place], [place] [address]    Culture ABUNDANT PSEUDOMONAS AERUGINOSA  Final   Report Status PENDING  Incomplete     Studies: Dg Chest Port 1v Same Day  Result Date: 04/23/2018 CLINICAL DATA:  Pneumonia. EXAM: PORTABLE CHEST 1 VIEW COMPARISON:  Radiograph of April 19, 2018. FINDINGS: Stable cardiomediastinal silhouette. Tracheostomy tube is in good position. Right-sided PICC line is unchanged in position. No pneumothorax is noted. Mild bibasilar subsegmental atelectasis is noted with minimal pleural effusions. Bony thorax is unremarkable. IMPRESSION: Mild bibasilar subsegmental atelectasis with minimal pleural effusions. Stable support apparatus. Electronically Signed   By: Marijo Conception, M.D.   On: 04/23/2018 08:04  Scheduled Meds: . apixaban  2.5 mg Per Tube BID  . aspirin  81 mg Oral Daily  . carvedilol  6.25 mg Oral BID WC  . chlorhexidine gluconate (MEDLINE KIT)  15 mL Mouth Rinse BID  . Chlorhexidine Gluconate Cloth  6 each Topical Daily   . collagenase   Topical Daily  . famotidine  20 mg Oral QHS  . feeding supplement (PRO-STAT SUGAR FREE 64)  30 mL Per Tube Daily  . feeding supplement (VITAL HIGH PROTEIN)  1,000 mL Per Tube Q24H  . free water  200 mL Per Tube Q8H  . furosemide  40 mg Oral Daily  . levETIRAcetam  1,000 mg Oral BID  . mouth rinse  15 mL Mouth Rinse 10 times per day  . mupirocin ointment  1 application Nasal BID  . pantoprazole sodium  40 mg Per Tube Daily  . sodium chloride flush  10-40 mL Intracatheter Q12H  . sodium chloride flush  3 mL Intravenous Q12H  . valproic acid  500 mg Oral BID   Continuous Infusions:  Principal Problem:   Acute encephalopathy Active Problems:   Essential (primary) hypertension   History of cardiac catheterization   Hyperlipidemia   DVT (deep venous thrombosis) (HCC)   Venous ulcer of ankle (HCC)   Seizures (HCC)   A-fib (HCC)   CVA (cerebral vascular accident) ([place])   Chronic respiratory failure ([place])   Acute respiratory failure with hypoxia ([place])    Time spent: >35 minutes     Kinnie Feil  [facility] Pager [phone]. If 7PM-7AM, please contact night-coverage at [email or web address], password [facility] 04/24/2018, 8:32 AM  LOS: 6 days

## 2018-04-24 NOTE — Consult Note (Signed)
   [facility] CM Inpatient Consult   04/24/2018  Melissa Cochran Mar 28, 1936 [id number]     Patient screened for potential [facility] Care Management services due to unplanned readmission risk score of 27% (high).  Chart reviewed. Patient is currently in ICU on vent.    Marthenia Rolling, MSN-Ed, RN,BSN [facility] Liaison [phone]

## 2018-04-24 NOTE — Progress Notes (Signed)
Nutrition Follow-up  DOCUMENTATION CODES:   Not applicable  INTERVENTION:  - Will adjust TF regimen: Vital High Protein @ 40 mL/hr with 30 mL Prostat BID. - Will add Juven BID, each packet provides 80 caloriesand 14 grams of amino acids; supplement contains CaHMB, glutamine, and arginine, to promote wound healing. - New TF regimen will provide: 1320 kcal (104% estimated kcal need), 114 grams of protein, 28 grams of amino acids, and 802 mL free water.  - Will order liquid multivitamin per PEG/day.  - Continue free water per MD/NP.   NUTRITION DIAGNOSIS:   Increased nutrient needs related to wound healing as evidenced by estimated needs. -ongoing  GOAL:   Patient will meet greater than or equal to 90% of their needs -met with TF regimen  MONITOR:   Vent status, TF tolerance, Weight trends, Labs, Skin  ASSESSMENT:   82 year old with past medical history relevant for atrial fibrillation, HTN, CAD s/p stent in 2011, CHF, nephrolithiasis complicated by hydronephrosis s/p stent placement in 2017, CVA, seizure disorder complicated by chronic respiratory failure with ventilator dependence and PEG tube placement who was admitted from [facility] for lethargy and electrolyte imbalances.  Weight stable since assessment on 12/11. Patient intubated via trach. PEG in place and she is receiving Vital High Protein @ 50 mL/hr with 30 ml Prostat once/day and 200 mL free water TID. This regimen is providing 1300 kcal, 120 grams of protein, and 1603 mL free water. Plan is for LTAC at d/c.     Patient is currently intubated on ventilator support MV: 5.6 L/min Temp (24hrs), Avg:98.3 F (36.8 C), Min:97.6 F (36.4 C), Max:98.7 F (37.1 C) BP: 114/53 and MAP: 73  Medications reviewed; 20 mg Pepcid per PEG/day, 40 mg Lasix per PEG/day, 40 mg Protonix per PEG/day, Labs reviewed; CBGs: 102 and 97 mg/dL today, BUN: 32 mg/dL, creatinine: 0.33 mg/dL, Ca: 8.7 mg/dL.    Diet Order:   Diet Order        Diet - low sodium heart healthy        Diet NPO time specified  Diet effective now              EDUCATION NEEDS:   Not appropriate for education at this time  Skin:  Skin Assessment: Skin Integrity Issues: Skin Integrity Issues:: Stage IV, Unstageable Stage IV: vertebral column Unstageable: full thickness to sacrum  Last BM:  12/15  Height:   Ht Readings from Last 1 Encounters:  04/18/18 '5\' 4"'$  (1.626 m)    Weight:   Wt Readings from Last 1 Encounters:  04/24/18 70.1 kg    Ideal Body Weight:  54.54 kg  BMI:  Body mass index is 26.53 kg/m.  Estimated Nutritional Needs:   Kcal:  1263 kcal  Protein:  108-127 grams (1.7-2 grams/kg)  Fluid:  >/= 2 L/day      Jarome Matin, MS, RD, LDN, [facility] Inpatient Clinical Dietitian Pager # [phone] After hours/weekend pager # [phone]

## 2018-04-24 NOTE — Progress Notes (Signed)
NAME:  Melissa Cochran, MRN:  [id number], DOB:  1936-01-02, LOS: 6 ADMISSION DATE:  04/18/2018, CONSULTATION DATE:  04/18/18 REFERRING MD:  Florene Glen, CHIEF COMPLAINT:  Acute encephalopathy   Brief History   82 y/o female who has been vent dependent since April 2019 was admitted to [facility] for acute change in mental status on 04/18/18. Came from [facility].   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  This is suggestive of a focal disturbance over the right hemisphere with epileptogenic potential.   Micro Data:  12/10 blood >> 1/2 coag neg staph  12/10 urine >> negative  12/11 tracheal aspirate >> few pseudomonas aeruginosa >> S-ceftaz, zosyn  Antimicrobials:  Per primary   Interim history/subjective:     Objective   Blood pressure (!) 114/53, pulse 73, temperature 98.7 F (37.1 C), temperature source Oral, resp. rate 13, height 5\' 4"  (1.626 m), weight 70.1 kg, SpO2 98 %.    Vent Mode: PRVC FiO2 (%):  [28 %] 28 % Set Rate:  [12 bmp] 12 bmp Vt Set:  [430 mL] 430 mL PEEP:  [5 cmH20] 5 cmH20 Plateau Pressure:  [15 cmH20-19 cmH20] 17 cmH20   Intake/Output Summary (Last 24 hours) at 04/24/2018 1019 Last data filed at 04/24/2018 2536 Gross per 24 hour  Intake 1402.5 ml  Output 950 ml  Net 452.5 ml   Filed Weights   04/22/18 0500 04/23/18 0116 04/24/18 0113  Weight: 69.1 kg 70.7 kg 70.1 kg    Examination: General: frail elderly female lying in bed on vent  HEENT: MM pink/moist, trach midline c/d/i  Neuro: opens eyes to voice then  closes again, no follow commands, torticollis  CV: s1s2 rrr, no m/r/g PULM: even/non-labored, lungs bilaterally clear  UY:QIHK, non-tender, bsx4 active, flexiseal in place  Extremities: warm/dry, trace generalized edema  Skin: no rashes or lesions, decubitus ulcers dressed   Resolved Hospital Problem list     Assessment & Plan:   Chronic respiratory failure with hypoxemia: P: PSV wean ok during daytime, PRVC 8cc/kg at night  VAP prevention measures   Acute encephalopathy -multifactorial, no one clear underlying cause P: Per primary, neurology   Seizure Disorder P: Per Neurology, [facility]   Hypernatremia P: Per [facility] Free water via tube  Follow BMP    Best practice:  Diet: per [facility] Pain/Anxiety/Delirium protocol (if indicated): N/A VAP protocol (if indicated): yes DVT prophylaxis: add sub cutaneous heparin GI prophylaxis: n/a Disposition:  She has run out of her medicare days, can not go back to [facility].  Will need LTAC or vent SNF due to vent needs.  PCCM will continue to follow for trach / vent needs.   Labs   CBC: Recent Labs  Lab 04/18/18 1215 04/19/18 0415 04/20/18 0607 04/21/18 0342 04/23/18 0425  WBC 9.1 7.8 6.4 7.6 7.3  NEUTROABS 6.8  --   --   --   --   HGB 8.7* 8.9* 8.2* 8.8* 8.2*  HCT 34.1* 33.0* 29.8* 31.6* 29.6*  MCV 104.0* 100.0 97.7 95.2 98.0  PLT 186 172 151 178 742    Basic Metabolic Panel: Recent Labs  Lab  04/18/18 2145 04/19/18 0415 04/19/18 1350 04/19/18 1654 04/20/18 0607 04/20/18 1800 04/21/18 0342 04/23/18 0425  NA 154* 151*  --   --  141  --  139 141  K 3.9 3.9  --   --  3.3*  --  3.5 4.7  CL 120* 116*  --   --  108  --  108 105  CO2 25 27  --   --  27  --  25 28  GLUCOSE 103* 103*  --   --  122*  --  109* 111*  BUN 37* 40*  --   --  36*  --  31* 32*  CREATININE 0.32* 0.32*  --   --  0.33*  --  <0.30* 0.33*  CALCIUM 8.6* 9.1  --   --  8.7*  --  8.8* 8.7*  MG 2.3 2.4  --   --  2.1  --   --  2.1  PHOS  --   --  2.4* 2.4* 2.4*  2.4*  --   --    GFR: Estimated Creatinine Clearance: 53.9 mL/min (A) (by C-G formula based on SCr of 0.33 mg/dL (L)). Recent Labs  Lab 04/18/18 1222 04/18/18 2145 04/19/18 0415 04/20/18 0607 04/21/18 0342 04/23/18 0425  PROCALCITON  --  <0.10 <0.10 <0.10  --   --   WBC  --   --  7.8 6.4 7.6 7.3  LATICACIDVEN 1.46  --   --   --   --   --     Liver Function Tests: Recent Labs  Lab 04/18/18 1215 04/19/18 0415 04/20/18 0607 04/23/18 0425  AST 20 16 16  42*  ALT 29 22 21 15   ALKPHOS 53 45 43 50  BILITOT 0.9 1.1 0.9 1.1  PROT 6.1* 5.5* 5.0* 5.2*  ALBUMIN 2.7* 2.2* 2.0* 2.1*   No results for input(s): LIPASE, AMYLASE in the last 168 hours. Recent Labs  Lab 04/18/18 2145  AMMONIA 25    ABG    Component Value Date/Time   PHART 7.500 (H) 04/18/2018 1221   PCO2ART 37.3 04/18/2018 1221   PO2ART 78.3 (L) 04/18/2018 1221   HCO3 28.9 (H) 04/18/2018 1221   TCO2 27 06/26/2017 0210   ACIDBASEDEF 2.0 06/26/2017 0210   O2SAT 96.6 04/18/2018 1221     Coagulation Profile: No results for input(s): INR, PROTIME in the last 168 hours.  Cardiac Enzymes: Recent Labs  Lab 04/18/18 1215 04/18/18 2145  TROPONINI 0.03* <0.03    HbA1C: Hgb A1c MFr Bld  Date/Time Value Ref Range Status  02/13/2017 09:35 PM 5.1 4.8 - 5.6 % Final    Comment:    (NOTE) Pre diabetes:          5.7%-6.4% Diabetes:              >6.4% Glycemic control for   <7.0% adults with diabetes     CBG: Recent Labs  Lab 04/23/18 1136 04/23/18 1523 04/23/18 2014 04/24/18 0336 04/24/18 [place], NP-C  Pulmonary & Critical Care Pgr: [phone] or if no answer [phone] 04/24/2018, 10:19 AM

## 2018-04-25 DIAGNOSIS — I82413 Acute embolism and thrombosis of femoral vein, bilateral: Secondary | ICD-10-CM

## 2018-04-25 LAB — CLOSTRIDIUM DIFFICILE BY PCR, REFLEXED: Toxigenic C. Difficile by PCR: POSITIVE — AB

## 2018-04-25 LAB — GLUCOSE, CAPILLARY
Glucose-Capillary: 97 mg/dL (ref 70–99)
Glucose-Capillary: 100 mg/dL — ABNORMAL HIGH (ref 70–99)
Glucose-Capillary: 96 mg/dL (ref 70–99)
Glucose-Capillary: 112 mg/dL — ABNORMAL HIGH (ref 70–99)
Glucose-Capillary: 105 mg/dL — ABNORMAL HIGH (ref 70–99)

## 2018-04-25 LAB — CULTURE, BLOOD (ROUTINE X 2)
Culture: NO GROWTH
Special Requests: ADEQUATE

## 2018-04-25 MED ORDER — VANCOMYCIN 50 MG/ML ORAL SOLUTION
125.0000 mg | Freq: Four times a day (QID) | ORAL | Status: DC
  Administered 2018-04-25 – 2018-05-02 (×30): 125 mg via ORAL
  Filled 2018-04-25 (×31): qty 2.5

## 2018-04-25 NOTE — Care Management Note (Addendum)
Case Management Note  Patient Details  Name: ARYAHNA SPAGNA MRN: [id number] Date of Birth: 11-18-1935  Subjective/Objective:                   Continued inpatient stay is needed until 1 or more of the following are present: ? Acceptable patient status for next level of care is achieved. ? ALL of the following are present:   Hemodynamic stability    Respiratory status acceptable    Neurologic status acceptable    Temperature status acceptable    No infection, or status acceptable    No neutropenia, or status acceptable    Special infection or injury situations absent    Electrolyte status acceptable    Activity level acceptable    Intake acceptable    Action/Plan: C.diff panel is positive/ p.o. vancomycin started.  Poor intake/ Acute metabolic encephalopathy Possible recent pneumonia/bacterial tracheitis: Expected Discharge Date:  04/23/18               Expected Discharge Plan:  Long Term Acute Care (LTAC)  In-House Referral:  NA  Discharge planning Services  CM Consult  Post Acute Care Choice:  Long Term Acute Care (LTAC), Resumption of Svcs/PTA Provider Choice offered to:  NA  DME Arranged:  N/A DME Agency:  NA  HH Arranged:  NA HH Agency:  NA  Status of Service:  Completed, signed off  If discussed at [place] of Stay Meetings, dates discussed:    Additional Comments:  Leeroy Cha, RN 04/25/2018, 12:33 PM

## 2018-04-25 NOTE — Progress Notes (Signed)
[facility] PROGRESS NOTE  Melissa Cochran ZSW:[id number] DOB: 05-14-1935 DOA: 04/18/2018 PCP: [name], MD  Brief summary   82 year old with past medical history relevant for chronic atrial fibrillation on apixaban, hypertension, coronary artery disease status post stent in 3220, chronic systolic heart failure with EF of 45 to 50% by echo on 06/28/2017, nephrolithiasis complicated by hydronephrosis status post stent placement in 2017, history of bilateral hydronephrosis, CVA, seizure disorder complicated by chronic respiratory failure with ventilator dependence and PEG tube placement who was admitted from [facility] for lethargy and electrolyte imbalances, found to have C diff  Assessment/Plan:  Acute metabolic encephalopathy:   her baseline is unclear though reportedly she was able to speak using a Passy-Muir valve at [facility] and was working on swallowing.  Her encephalopathy has almost completely resolved.  At this time it seems to be likely secondary to decreasing levels are antiepileptics due to meropenem given at [facility] for possible hospital-acquired pneumonia as well as electrolyte abnormalities including hyERonatremia. -EEG on 04/19/2018 shows occasional sharp activity over right frontotemporal region which neurology is attributing to a meningioma. B12 and folate replete, RPR negative. Continue antiepileptics per below. Brain MRI shows no acute process but does show old stroke and meningioma.  -unfortunately she has run out of Medicare days and so cannot go back to [facility]. Awaiting placement   Possible recent pneumonia/bacterial tracheitis: Patient reportedly was treated for pneumonia at [facility] with meropenem and vancomycin.  There is no culture results from this that of return.  Of note there was a concern that meropenem could decrease the valproic acid level and cause patient to have a seizure.  At this time the relevance of this respiratory culture from 04/19/2018  growing Pseudomonas is unclear particularly the patient does not have any other infectious symptoms and a procalcitonin is quite low. blood cultures on 04/18/2018 growing 1 out of 2 coagulase-negative staph. Respiratory culture from 04/19/2018 growing Pseudomonas. Repeat respiratory culture from 04/21/2018 no growth to date. Procalcitonin was undetectable on admission  C diff colitis. Likely due to recent antibiotic use at [facility]. Start PO vanc. 12/16>>. Monitor   Seizure disorder: Restart levetiracetam thousand milligrams twice daily by tube. Restart valproic acid 250 mg twice daily by tube  Torticollis. Con physical therapy  Chronic respiratory failure status post tracheostomy: PCCM following, appreciate t recommendations  Hypernatremia with PEG tube dependence: Resolved with free water flushes and D5W. Continue free water flushes 200 mL's every 8 hours. Vital 1.5 @ 40 mL/hr with 30 mL Prostat BID  Chronic atrial fibrillation complicated by CVA:. Continue beta-blocker. Continue apixaban 2.5 mg twice daily  Hypertension/hyperlipidemia/coronary artery disease status post stent: Continue aspirin 81 mg. continue carvedilol 6.25 mg twice daily  Anemia: This appears to be stable at this time.  Suspect most likely iron deficiency anemia and anemia of chronic disease.  Nephrolithiasis/hydronephrosis status post stents: Currently her creatinine is normal. Low threshold to repeat CT abdomen   Chronic systolic heart failure: Last echo in February showed EF of 40 to 45%. continue furosemide 40 mg daily via tube  Prognosis is guarded with stroke, related encephalopathy, seizure, chronic heart failure, respiratory failure, vent dependant, associated with infections, at risk for recurrent infection. Will ask palliative care evaluation   Code Status: full Family Communication:  D/w patient, RN (indicate person spoken with, relationship, and if by phone, the number) Disposition Plan: pend LTEC  placement    Consultants:  PCCM  Neurology  Procedures:  04/19/2018 URK:YHCW is an abnormal electroencephalogram  due tooccasionalsharp activity over the rightfrontotemporal region. This issuggestive ofa focal disturbance over right hemisphere with epileptogenic potential.  04/22/2018 EEG:   Antibiotics: Anti-infectives (From admission, onward)   Start     Dose/Rate Route Frequency Ordered Stop   04/25/18 1000  vancomycin (VANCOCIN) 50 mg/mL oral solution 125 mg     125 mg Oral 4 times daily 04/25/18 0853 05/05/18 0959   04/23/18 0800  piperacillin-tazobactam (ZOSYN) IVPB 3.375 g  Status:  Discontinued     3.375 g 12.5 mL/hr over 240 Minutes Intravenous Every 8 hours 04/23/18 0726 04/23/18 0951   04/23/18 0730  piperacillin-tazobactam (ZOSYN) IVPB 3.375 g  Status:  Discontinued     3.375 g 100 mL/hr over 30 Minutes Intravenous Every 6 hours 04/23/18 0723 04/23/18 0726       (indicate start date, and stop date if known)  HPI/Subjective: Remains unchanged. Follows commands minimally. No acute distress. Stable on vent. Afebrile. Found to have C diff.   Objective: Vitals:   04/25/18 0741 04/25/18 0800  BP:  (!) 107/58  Pulse:  73  Resp:  12  Temp:  98.6 F (37 C)  SpO2: 100% 98%    Intake/Output Summary (Last 24 hours) at 04/25/2018 0854 Last data filed at 04/25/2018 0542 Gross per 24 hour  Intake 480 ml  Output 1975 ml  Net -1495 ml   Filed Weights   04/23/18 0116 04/24/18 0113 04/25/18 0215  Weight: 70.7 kg 70.1 kg 72.3 kg    Exam:   General:  No distress   Cardiovascular: s1,s2 rrr  Respiratory: no wheezing   Abdomen: soft, NT  Musculoskeletal: decub ulcers.    Data Reviewed: Basic Metabolic Panel: Recent Labs  Lab 04/18/18 2145 04/19/18 0415 04/19/18 1350 04/19/18 1654 04/20/18 0607 04/20/18 1800 04/21/18 0342 04/23/18 0425  NA 154* 151*  --   --  141  --  139 141  K 3.9 3.9  --   --  3.3*  --  3.5 4.7  CL 120* 116*  --   --  108   --  108 105  CO2 25 27  --   --  27  --  25 28  GLUCOSE 103* 103*  --   --  122*  --  109* 111*  BUN 37* 40*  --   --  36*  --  31* 32*  CREATININE 0.32* 0.32*  --   --  0.33*  --  <0.30* 0.33*  CALCIUM 8.6* 9.1  --   --  8.7*  --  8.8* 8.7*  MG 2.3 2.4  --   --  2.1  --   --  2.1  PHOS  --   --  2.4* 2.4* 2.4* 2.4*  --   --    Liver Function Tests: Recent Labs  Lab 04/18/18 1215 04/19/18 0415 04/20/18 0607 04/23/18 0425  AST _0 42*  ALT _1 ALKPHOS 53 45 43 50  BILITOT 0.9 1.1 0.9 1.1  PROT 6.1* 5.5* 5.0* 5.2*  ALBUMIN 2.7* 2.2* 2.0* 2.1*   No results for input(s): LIPASE, AMYLASE in the last 168 hours. Recent Labs  Lab 04/18/18 2145  AMMONIA 25   CBC: Recent Labs  Lab 04/18/18 1215 04/19/18 0415 04/20/18 0607 04/21/18 0342 04/23/18 0425  WBC 9.1 7.8 6.4 7.6 7.3  NEUTROABS 6.8  --   --   --   --   HGB 8.7* 8.9* 8.2* 8.8* 8.2*  HCT 34.1* 33.0* 29.8*  31.6* 29.6*  MCV 104.0* 100.0 97.7 95.2 98.0  PLT 186 172 151 178 160   Cardiac Enzymes: Recent Labs  Lab 04/18/18 1215 04/18/18 2145  TROPONINI 0.03* <0.03   BNP (last 3 results) No results for input(s): BNP in the last 8760 hours.  ProBNP (last 3 results) No results for input(s): PROBNP in the last 8760 hours.  CBG: Recent Labs  Lab 04/24/18 1522 04/24/18 1943 04/24/18 2337 04/25/18 0325 04/25/18 0806  GLUCAP 94 95 96 97 100*    Recent Results (from the past 240 hour(s))  Blood Culture (routine x 2)     Status: None (Preliminary result)   Collection Time: 04/18/18 12:15 PM  Result Value Ref Range Status   Specimen Description BLOOD PICC LINE  Final   Special Requests   Final    BOTTLES DRAWN AEROBIC AND ANAEROBIC Blood Culture adequate volume   Culture   Final    NO GROWTH 4 DAYS Performed at [facility], [place] [address] [place], [place] [address]    Report Status PENDING  Incomplete  Blood Culture (routine x 2)     Status: Abnormal   Collection Time: 04/18/18  3:05  PM  Result Value Ref Range Status   Specimen Description   Final    BLOOD RIGHT HAND Performed at [place] [address], [place], [place] [address]    Special Requests   Final    BOTTLES DRAWN AEROBIC ONLY Blood Culture results may not be optimal due to an inadequate volume of blood received in culture bottles Performed at [place] [address] [place], [place] [address]    Culture  Setup Time   Final    GRAM POSITIVE COCCI AEROBIC BOTTLE ONLY CRITICAL RESULT CALLED TO, READ BACK BY AND VERIFIED WITH: L POINDEXTER PHARMD 0201 04/20/18 A BROWNING    Culture (A)  Final    STAPHYLOCOCCUS SPECIES (COAGULASE NEGATIVE) THE SIGNIFICANCE OF ISOLATING THIS ORGANISM FROM A SINGLE SET OF BLOOD CULTURES WHEN MULTIPLE SETS ARE DRAWN IS UNCERTAIN. PLEASE NOTIFY THE MICROBIOLOGY DEPARTMENT WITHIN ONE WEEK IF SPECIATION AND SENSITIVITIES ARE REQUIRED. Performed at [facility], [place] [address], [place], [place] [address]    Report Status 04/22/2018 FINAL  Final  Blood Culture ID Panel (Reflexed)     Status: None   Collection Time: 04/18/18  3:05 PM  Result Value Ref Range Status   Enterococcus species NOT DETECTED NOT DETECTED Final   Listeria monocytogenes NOT DETECTED NOT DETECTED Final   Staphylococcus species NOT DETECTED NOT DETECTED Final   Staphylococcus aureus (BCID) NOT DETECTED NOT DETECTED Final   Streptococcus species NOT DETECTED NOT DETECTED Final   Streptococcus agalactiae NOT DETECTED NOT DETECTED Final   Streptococcus pneumoniae NOT DETECTED NOT DETECTED Final   Streptococcus pyogenes NOT DETECTED NOT DETECTED Final   Acinetobacter baumannii NOT DETECTED NOT DETECTED Final   Enterobacteriaceae species NOT DETECTED NOT DETECTED Final   Enterobacter cloacae complex NOT DETECTED NOT DETECTED Final   Escherichia coli NOT DETECTED NOT DETECTED Final   Klebsiella oxytoca NOT DETECTED NOT DETECTED Final   Klebsiella pneumoniae  NOT DETECTED NOT DETECTED Final   Proteus species NOT DETECTED NOT DETECTED Final   Serratia marcescens NOT DETECTED NOT DETECTED Final   Haemophilus influenzae NOT DETECTED NOT DETECTED Final   Neisseria meningitidis NOT DETECTED NOT DETECTED Final   Pseudomonas aeruginosa NOT DETECTED NOT DETECTED Final   Candida albicans NOT DETECTED NOT DETECTED Final  Candida glabrata NOT DETECTED NOT DETECTED Final   Candida krusei NOT DETECTED NOT DETECTED Final   Candida parapsilosis NOT DETECTED NOT DETECTED Final   Candida tropicalis NOT DETECTED NOT DETECTED Final    Comment: Performed at [facility], [place] [address] [place], [place] [address]  Urine culture     Status: None   Collection Time: 04/18/18  3:34 PM  Result Value Ref Range Status   Specimen Description   Final    URINE, RANDOM Performed at [facility] [address], [place], [place] [address]    Special Requests   Final    NONE Performed at [facility], [place] [address] [place], [place] [address]    Culture   Final    NO GROWTH Performed at [facility], [place] [address], [place], [place] [address]    Report Status 04/19/2018 FINAL  Final  Culture, respiratory (non-expectorated)     Status: None   Collection Time: 04/19/18  3:48 PM  Result Value Ref Range Status   Specimen Description   Final    TRACHEAL ASPIRATE Performed at [place] [address], [place], [place] [address]    Special Requests   Final    NONE Performed at [facility], [place] [address] [place], [place] [address]    Gram Stain   Final    ABUNDANT WBC PRESENT,BOTH PMN AND MONONUCLEAR ABUNDANT GRAM NEGATIVE RODS Performed at [facility], [place] [address], [place], [place] [address]    Culture FEW PSEUDOMONAS AERUGINOSA  Final   Report Status 04/22/2018 FINAL  Final   Organism ID, Bacteria PSEUDOMONAS AERUGINOSA  Final      Susceptibility    Pseudomonas aeruginosa - MIC*    CEFTAZIDIME 8 SENSITIVE Sensitive     CIPROFLOXACIN >=4 RESISTANT Resistant     GENTAMICIN 8 INTERMEDIATE Intermediate     IMIPENEM >=16 RESISTANT Resistant     PIP/TAZO 16 SENSITIVE Sensitive     CEFEPIME 16 INTERMEDIATE Intermediate     * FEW PSEUDOMONAS AERUGINOSA  MRSA PCR Screening     Status: Abnormal   Collection Time: 04/20/18  6:08 PM  Result Value Ref Range Status   MRSA by PCR POSITIVE (A) NEGATIVE Final    Comment:        The GeneXpert MRSA Assay (FDA approved for NASAL specimens only), is one component of a comprehensive MRSA colonization surveillance program. It is not intended to diagnose MRSA infection nor to guide or monitor treatment for MRSA infections. RESULT CALLED TO, READ BACK BY AND VERIFIED WITH: B.[email or web address] AT 2101 ON 04/20/18 BY N.THOMPSON Performed at [facility], [place] [address], [place], [place] [address]   Culture, respiratory (non-expectorated)     Status: None   Collection Time: 04/21/18  3:38 PM  Result Value Ref Range Status   Specimen Description   Final    TRACHEAL ASPIRATE Performed at [place] [address] [place], [place] [address]    Special Requests   Final    Immunocompromised Performed at [facility], [place] [address], [place], [place] [address]    Gram Stain   Final    NO WBC SEEN FEW GRAM VARIABLE ROD Performed at [facility], [place] [address], [place],  [address]    Culture ABUNDANT PSEUDOMONAS AERUGINOSA  Final   Report Status 04/24/2018 FINAL  Final   Organism ID, Bacteria PSEUDOMONAS AERUGINOSA  Final      Susceptibility   Pseudomonas aeruginosa - MIC*    CEFTAZIDIME 4 SENSITIVE Sensitive     CIPROFLOXACIN 2 INTERMEDIATE Intermediate     GENTAMICIN 8 INTERMEDIATE Intermediate     IMIPENEM >=16 RESISTANT Resistant     PIP/TAZO 8 SENSITIVE Sensitive     CEFEPIME 16 INTERMEDIATE Intermediate     * ABUNDANT  PSEUDOMONAS AERUGINOSA  C difficile quick scan w PCR reflex     Status: Abnormal   Collection Time: 04/24/18 10:10 PM  Result Value Ref Range Status   C Diff antigen POSITIVE (A) NEGATIVE Final   C Diff toxin NEGATIVE NEGATIVE Final   C Diff interpretation Results are indeterminate. See PCR results.  Final    Comment: Performed at [facility], [place] [address], [place], [place] [address]  C. Diff by PCR, Reflexed     Status: Abnormal   Collection Time: 04/24/18 10:10 PM  Result Value Ref Range Status   Toxigenic C. Difficile by PCR POSITIVE (A) NEGATIVE Final    Comment: Positive for toxigenic C. difficile with little to no toxin production. Only treat if clinical presentation suggests symptomatic illness. Performed at [facility], [place] [address], [place], [place] [address]      Studies: No results found.  Scheduled Meds: . apixaban  2.5 mg Per Tube BID  . aspirin  81 mg Oral Daily  . carvedilol  6.25 mg Oral BID WC  . chlorhexidine gluconate (MEDLINE KIT)  15 mL Mouth Rinse BID  . Chlorhexidine Gluconate Cloth  6 each Topical Daily  . collagenase   Topical Daily  . famotidine  20 mg Oral QHS  . feeding supplement (PRO-STAT SUGAR FREE 64)  30 mL Per Tube BID  . feeding supplement (VITAL HIGH PROTEIN)  1,000 mL Per Tube Q24H  . free water  200 mL Per Tube Q8H  . furosemide  40 mg Oral Daily  . levETIRAcetam  1,000 mg Oral BID  . mouth rinse  15 mL Mouth Rinse 10 times per day  . multivitamin  15 mL Per Tube Daily  . mupirocin ointment  1 application Nasal BID  . nutrition supplement (JUVEN)  1 packet Per Tube BID BM  . pantoprazole sodium  40 mg Per Tube Daily  . sodium chloride flush  10-40 mL Intracatheter Q12H  . sodium chloride flush  3 mL Intravenous Q12H  . valproic acid  500 mg Oral BID  . vancomycin  125 mg Oral QID   Continuous Infusions:  Principal Problem:   Acute encephalopathy Active Problems:   Essential (primary) hypertension    History of cardiac catheterization   Hyperlipidemia   DVT (deep venous thrombosis) (HCC)   Venous ulcer of ankle (HCC)   Seizures (HCC)   A-fib (HCC)   CVA (cerebral vascular accident) ([place])   Chronic respiratory failure ([place])   Acute respiratory failure with hypoxia ([place])    Time spent: >35 minutes     Kinnie Feil  [facility] Pager [phone]. If 7PM-7AM, please contact night-coverage at [email or web address], password [facility] 04/25/2018, 8:54 AM  LOS: 7 days

## 2018-04-26 LAB — GLUCOSE, CAPILLARY
GLUCOSE-CAPILLARY: 102 mg/dL — AB (ref 70–99)
Glucose-Capillary: 98 mg/dL (ref 70–99)
Glucose-Capillary: 100 mg/dL — ABNORMAL HIGH (ref 70–99)
Glucose-Capillary: 105 mg/dL — ABNORMAL HIGH (ref 70–99)
Glucose-Capillary: 117 mg/dL — ABNORMAL HIGH (ref 70–99)
Glucose-Capillary: 106 mg/dL — ABNORMAL HIGH (ref 70–99)
Glucose-Capillary: 111 mg/dL — ABNORMAL HIGH (ref 70–99)

## 2018-04-26 MED ORDER — ACETAMINOPHEN 160 MG/5ML PO SOLN
650.0000 mg | Freq: Four times a day (QID) | ORAL | Status: DC | PRN
  Administered 2018-04-26: 650 mg via ORAL
  Filled 2018-04-26: qty 20.3

## 2018-04-26 NOTE — Consult Note (Signed)
Consultation Note Date: 04/26/2018   Patient Name: Melissa Cochran  DOB: 1935-08-14  MRN: [id number]  Age / Sex: 82 y.o., female  PCP: [name], MD Referring Physician: Kinnie Feil, MD  Reason for Consultation: Establishing goals of care  HPI/Patient Profile: 82 y.o. female  with past medical history of stroke, seizures, trach/PEG, cardiac arrest, COPD, CAD, CHF, atrial fibrillation admitted from [facility] on 04/18/2018 with ongoing altered mental status following recent HCAP. Currently being treated for C. diff following antibiotic treatment of HCAP and return to being vent dependent.   Clinical Assessment and Goals of Care: I met today at Ms. Schoenherr's bedside but no family and Ms. Kachmar continues to be somnolent and would only open her eyes briefly to much verbal and tactile stimuli. She is not alert enough to participate in [place] conversation.   I spent some time reviewing chart and records and past palliative care notes in electronic chart. Noted past [place] with daughter, [name], who has requested full aggressive care and that she would only let her mother go if she felt that she was suffering. She was okay with her mother not being able to eat/drink, talk, walk, or even breathe on her own.   I called and spoke with [name]. [name] shares with me her mother's experience and how she has advocated and fought the medical team who kept telling her that her mother was dying. [name] shares that her mother has been admitted at [facility], [facility], [facility], and [facility]. She tells me that her mother was resuscitated at [facility] shares how she has continued to fight to keep her mother alive and has been extremely pleased with the progress her mother has received at [facility]. Before her recent HCAP a couple weeks ago Ms. Karam was reportedly weaned off vent, utilizing PMV, and  working with thickened liquids. She was able to call (with assistance) and speak with family via telephone. [name] was extremely pleased with this progress and feels that this is exactly what everyone told her would not happen.   I explained to [name] our concern for recurrent pneumonia, C. diff, and unstageable wounds as well as her mother's lack of improvement in mental status as well as now being vent dependent again. I explained that this brings [place] back to square one and I am unsure if her mother will be able to improve to her previous state a month ago. [name] acknowledges that this is an unknown but continues to be hopeful. Rhonda at this time would like to continue efforts with full aggressive care to see if her mother will improve again. We did also discuss LTACH vs vent SNF and I encouraged her to speak with her [facility] contacts to assist with her understanding and options as well as our CSW.   I will continue to support but doubt there will be any changes in [place] anytime soon. Would benefit from long term palliative follow up at next venue.   Primary Decision Maker NEXT OF KIN only  child is daughter [name]    SUMMARY OF RECOMMENDATIONS   - Full aggressive care desired, full code  Code Status/Advance Care Planning:  Full code   Symptom Management:   Per primary and PCCM.   Needs wound care to prevent worsening and infection. Explained to family that healing of this wound is extremely difficult and may not be possible.   VAP precautions.   Palliative Prophylaxis:   Aspiration, Delirium Protocol, Frequent Pain Assessment, Oral Care, Palliative Wound Care and Turn Reposition  Additional Recommendations (Limitations, Scope, Preferences):  Full Scope Treatment  Psycho-social/Spiritual:   Desire for further Chaplaincy support:no  Additional Recommendations: Caregiving  Support/Resources and Grief/Bereavement Support  Prognosis:   Unable to determine. Overall prognosis very  poor but difficult to prognosticate given aggressiveness of care desired including long term vent and artificial feeding. High risk for acute decompensation.   Discharge Planning: To Be Determined      Primary Diagnoses: Present on Admission: . Acute encephalopathy . Essential (primary) hypertension . Hyperlipidemia . A-fib ([place]) . DVT (deep venous thrombosis) ([place]) . Venous ulcer of ankle ([place])   I have reviewed the medical record, interviewed the patient and family, and examined the patient. The following aspects are pertinent.  Past Medical History:  Diagnosis Date  . Adnexal mass   . Anxiety   . Arthritis   . Bladder tumor   . Bleeding ulcer   . CHF (congestive heart failure) ([place])   . CHF (congestive heart failure) ([place])   . Chronic bronchitis ([place])   . Chronic respiratory failure ([place]) 04/19/2018  . COPD (chronic obstructive pulmonary disease) ([place])   . Coronary artery disease   . CVA (cerebral vascular accident) ([place]) 04/19/2018  . DVT (deep venous thrombosis) ([facility])    RLE  . GERD (gastroesophageal reflux disease)   . Heart block    left  . History of bleeding ulcers   . History of kidney stones   . Hyperlipemia   . Hypertension   . Myocardial infarction ([place])    "slight one" (07/05/2017)  . Pneumonia    "several times" (07/05/2017)  . Seizures ([place]) 02/2017 "several"; 06/26/2017 X 1  . Shortness of breath dyspnea   . Stroke [facility]) 02/13/2018   "still right sided weakness" (07/05/2017)   Social History   Socioeconomic History  . Marital status: Widowed    Spouse name: Not on file  . Number of children: Not on file  . Years of education: Not on file  . Highest education level: Not on file  Occupational History  . Not on file  Social Needs  . Financial resource strain: Not on file  . Food insecurity:    Worry: Not on file    Inability: Not on file  . Transportation needs:    Medical: Not on file    Non-medical: Not on file  Tobacco Use  . Smoking  status: Former Smoker    Packs/day: 1.00    Years: 40.00    Pack years: 40.00    Last attempt to quit: 06/13/1998    Years since quitting: 19.8  . Smokeless tobacco: Never Used  Substance and Sexual Activity  . Alcohol use: No  . Drug use: No  . Sexual activity: Not Currently  Lifestyle  . Physical activity:    Days per week: Not on file    Minutes per session: Not on file  . Stress: Not on file  Relationships  . Social connections:    Talks on phone: Not  on file    Gets together: Not on file    Attends religious service: Not on file    Active member of club or organization: Not on file    Attends meetings of clubs or organizations: Not on file    Relationship status: Not on file  Other Topics Concern  . Not on file  Social History Narrative  . Not on file   Family History  Problem Relation Age of Onset  . Stomach cancer Mother   . CVA Father   . Bladder Cancer Neg Hx   . Kidney cancer Neg Hx    Scheduled Meds: . apixaban  2.5 mg Per Tube BID  . aspirin  81 mg Oral Daily  . carvedilol  6.25 mg Oral BID WC  . chlorhexidine gluconate (MEDLINE KIT)  15 mL Mouth Rinse BID  . Chlorhexidine Gluconate Cloth  6 each Topical Daily  . collagenase   Topical Daily  . famotidine  20 mg Oral QHS  . feeding supplement (PRO-STAT SUGAR FREE 64)  30 mL Per Tube BID  . feeding supplement (VITAL HIGH PROTEIN)  1,000 mL Per Tube Q24H  . free water  200 mL Per Tube Q8H  . furosemide  40 mg Oral Daily  . levETIRAcetam  1,000 mg Oral BID  . mouth rinse  15 mL Mouth Rinse 10 times per day  . multivitamin  15 mL Per Tube Daily  . nutrition supplement (JUVEN)  1 packet Per Tube BID BM  . pantoprazole sodium  40 mg Per Tube Daily  . sodium chloride flush  10-40 mL Intracatheter Q12H  . sodium chloride flush  3 mL Intravenous Q12H  . valproic acid  500 mg Oral BID  . vancomycin  125 mg Oral QID   Continuous Infusions: PRN Meds:.acetaminophen (TYLENOL) oral liquid 160 mg/5 mL, fentaNYL  (SUBLIMAZE) injection, sodium chloride flush No Known Allergies Review of Systems  Unable to perform ROS: Acuity of condition    Physical Exam Vitals signs and nursing note reviewed.  Constitutional:      Appearance: She is ill-appearing.     Interventions: She is intubated.     Comments: Frail   Cardiovascular:     Rate and Rhythm: Normal rate and regular rhythm.  Pulmonary:     Effort: No tachypnea, accessory muscle usage or respiratory distress. She is intubated.     Breath sounds: Normal breath sounds.  Abdominal:     General: Bowel sounds are normal.     Palpations: Abdomen is soft.     Tenderness: There is no abdominal tenderness.  Neurological:     Comments: Minimally responsive with brief eye opening but no tracking. Does not follow any commands.      Vital Signs: BP (!) 114/59   Pulse 80   Temp (!) 101 F (38.3 C) (Oral) Comment: reported to rn   Resp 17   Ht 5' 4" (1.626 m)   Wt 71.5 kg   SpO2 98%   BMI 27.06 kg/m  Pain Scale: CPOT POSS *See Group Information*: 3-INTERVENTION REQUIRED,Unacceptable,Frequently drowsy, arousable, drifts off to sleep during conversation     SpO2: SpO2: 98 % O2 Device:SpO2: 98 % O2 Flow Rate: .   IO: Intake/output summary:   Intake/Output Summary (Last 24 hours) at 04/26/2018 1555 Last data filed at 04/26/2018 1214 Gross per 24 hour  Intake 2198 ml  Output 2875 ml  Net -677 ml    LBM: Last BM Date: 04/26/18 Baseline Weight: Weight: 63.5  kg Most recent weight: Weight: 71.5 kg     Palliative Assessment/Data: 20%   Flowsheet Rows     Most Recent Value  Intake Tab  Referral Department  Hospitalist  Unit at Time of Referral  ICU  Date Notified  04/25/18  Palliative Care Type  Return patient Palliative Care  Reason for referral  Clarify Goals of Care  Date of Admission  04/18/18  # of days IP prior to Palliative referral  7  Clinical Assessment  Psychosocial & Spiritual Assessment  Palliative Care Outcomes        Time In: 0370 Time Out: 1600 Time Total: 75 min Greater than 50%  of this time was spent counseling and coordinating care related to the above assessment and plan.  Signed by: Vinie Sill, NP Palliative Medicine Team Pager # [phone] (M-F 8a-5p) Team Phone # [phone] (Nights/Weekends)

## 2018-04-26 NOTE — Clinical Social Work Note (Signed)
Clinical Social Work Assessment  Patient Details  Name: Melissa Cochran MRN: [id number] Date of Birth: 15-Feb-1936  Date of referral:  04/26/18               Reason for consult:  Facility Placement                Permission sought to share information with:  (Patient is not oriented) Permission granted to share information::     Name::     Melissa Cochran  Agency::     Relationship::  Daughter  Contact Information:     Housing/Transportation Living arrangements for the past 2 months:  [facility]) Source of Information:  Adult Children Patient Interpreter Needed:  None(Patient non verbal due to illness) Criminal Activity/Legal Involvement Pertinent to Current Situation/Hospitalization:  No - Comment as needed Significant Relationships:  Adult Children Lives with:    Do you feel safe going back to the place where you live?    Need for family participation in patient care:  Yes (Comment)  Care giving concerns:  Per H&P Melissa Cochran is a 82 y.o. female with medical history significant of stroke s/p seizures, trach/peg with vent dependence, hypertension, COPD, coronary disease, heart failure, atrial fibrillation and multiple other medical problems who presents from [facility] with worsening mental status.    Social Worker assessment / plan:  LCSW consulted for facility placement.   Per RNCM not patient from [facility] and unable to return due to not having any LTACH days left.   Patient is vent dependent and there are only 2 local SNFs that accept vents.   LCSW spoke with patient's daughter, Melissa Cochran. Melissa Cochran stated that she had not been notified that her mom could not return to [facility]. Melissa Cochran reports that she has been in touch with [facility] and has completed the medicaid application per their request. From her understanding her mom is able to return.   LCSW explained LTACH vs SNF and medicare payment for facility stay. LCSW left messages for contacts Mervin Kung and Seth Bake at [facility] for  clarity.   PLAN: LCSW will continue to research facility options for patient dc.   Employment status:  Retired Forensic scientist:  Medicare PT Recommendations:  Not assessed at this time Information / Referral to community resources:     Patient/Family's Response to care:  Daughter, Melissa Cochran is thankful for LCSW follow up and resources.   Patient/Family's Understanding of and Emotional Response to Diagnosis, Current Treatment, and Prognosis:  Daughter Melissa Cochran expressed confusion about her moms discharge plan as she had not been told her mom can not return. LCSW listened to daughters concerns and acknowledged feelings.   Emotional Assessment Appearance:  Appears stated age Attitude/Demeanor/Rapport:  Unable to Assess Affect (typically observed):  Unable to Assess Orientation:    Alcohol / Substance use:  Not Applicable Psych involvement (Current and /or in the community):  No (Comment)  Discharge Needs  Concerns to be addressed:  No discharge needs identified Readmission within the last 30 days:  No Current discharge risk:  None Barriers to Discharge:  Vent Bed not available   Servando Snare, LCSW 04/26/2018, 11:40 AM

## 2018-04-26 NOTE — Progress Notes (Signed)
[facility] PROGRESS NOTE  ALLE DIFABIO EXH:[id number] DOB: Jul 23, 1935 DOA: 04/18/2018 PCP: Melissa Patch, MD  Brief summary   82 year old with past medical history relevant for chronic atrial fibrillation on apixaban, hypertension, coronary artery disease status post stent in 3810, chronic systolic heart failure with EF of 45 to 50% by echo on 06/28/2017, nephrolithiasis complicated by hydronephrosis status post stent placement in 2017, history of bilateral hydronephrosis, CVA, seizure disorder complicated by chronic respiratory failure with ventilator dependence and PEG tube placement who was admitted from [facility] for lethargy and electrolyte imbalances, found to have C diff  Assessment/Plan:  Acute metabolic encephalopathy:   her baseline is unclear though reportedly she was able to speak using a Passy-Muir valve at [facility] and was working on swallowing.  Her encephalopathy has almost completely resolved.  At this time it seems to be likely secondary to decreasing levels are antiepileptics due to meropenem given at [facility] for possible hospital-acquired pneumonia as well as electrolyte abnormalities including hyERonatremia. -EEG on 04/19/2018 shows occasional sharp activity over right frontotemporal region which neurology is attributing to a meningioma. B12 and folate replete, RPR negative. Continue antiepileptics per below. Brain MRI shows no acute process but does show old stroke and meningioma.  -unfortunately she has run out of Medicare days and so cannot go back to [facility]. Awaiting placement   Possible recent pneumonia/bacterial tracheitis: Patient reportedly was treated for pneumonia at [facility] with meropenem and vancomycin.  There is no culture results from this that of return.  Of note there was a concern that meropenem could decrease the valproic acid level and cause patient to have a seizure.  At this time the relevance of this respiratory culture from 04/19/2018  growing Pseudomonas is unclear particularly the patient does not have any other infectious symptoms and a procalcitonin is quite low. blood cultures on 04/18/2018 growing 1 out of 2 coagulase-negative staph. Respiratory culture from 04/19/2018 growing Pseudomonas. Repeat respiratory culture from 04/21/2018 no growth to date. Procalcitonin was undetectable on admission  C diff colitis. Likely due to recent antibiotic use at [facility]. Start PO vanc. 12/16>>. Monitor   Seizure disorder: Restart levetiracetam thousand milligrams twice daily by tube. Restart valproic acid 250 mg twice daily by tube  Torticollis. Con physical therapy  Chronic respiratory failure status post tracheostomy: PCCM following, appreciate t recommendations  Hypernatremia with PEG tube dependence: Resolved with free water flushes and D5W. Continue free water flushes 200 mL's every 8 hours. Vital 1.5 @ 40 mL/hr with 30 mL Prostat BID  Chronic atrial fibrillation complicated by CVA:. Continue beta-blocker. Continue apixaban 2.5 mg twice daily  Hypertension/hyperlipidemia/coronary artery disease status post stent: Continue aspirin 81 mg. continue carvedilol 6.25 mg twice daily  Anemia: This appears to be stable at this time.  Suspect most likely iron deficiency anemia and anemia of chronic disease.  Nephrolithiasis/hydronephrosis status post stents: Currently her creatinine is normal. Low threshold to repeat CT abdomen   Chronic systolic heart failure: Last echo in February showed EF of 40 to 45%. continue furosemide 40 mg daily via tube  Prognosis is guarded with stroke, related encephalopathy, seizure, chronic heart failure, respiratory failure, vent dependant, associated with infections, at risk for recurrent infection. Consulted  palliative care evaluation   Code Status: full Family Communication:  D/w patient, RN (indicate person spoken with, relationship, and if by phone, the number) Disposition Plan: pend [facility]EC  placement    Consultants:  PCCM  Neurology  Procedures:  04/19/2018 FBP:ZWCH is an abnormal electroencephalogram  due tooccasionalsharp activity over the rightfrontotemporal region. This issuggestive ofa focal disturbance over right hemisphere with epileptogenic potential.  04/22/2018 EEG:   Antibiotics: Anti-infectives (From admission, onward)   Start     Dose/Rate Route Frequency Ordered Stop   04/25/18 1000  vancomycin (VANCOCIN) 50 mg/mL oral solution 125 mg     125 mg Oral 4 times daily 04/25/18 0853 05/05/18 0959   04/23/18 0800  piperacillin-tazobactam (ZOSYN) IVPB 3.375 g  Status:  Discontinued     3.375 g 12.5 mL/hr over 240 Minutes Intravenous Every 8 hours 04/23/18 0726 04/23/18 0951   04/23/18 0730  piperacillin-tazobactam (ZOSYN) IVPB 3.375 g  Status:  Discontinued     3.375 g 100 mL/hr over 30 Minutes Intravenous Every 6 hours 04/23/18 0723 04/23/18 0726       (indicate start date, and stop date if known)  HPI/Subjective: Remains unchanged. Follows commands minimally. No acute distress. Stable on vent. Afebrile. Found to have C diff.   Objective: Vitals:   04/26/18 0728 04/26/18 0800  BP:  (!) 124/59  Pulse:  77  Resp: 15 14  Temp:  99 F (37.2 C)  SpO2: 98% 98%    Intake/Output Summary (Last 24 hours) at 04/26/2018 0828 Last data filed at 04/26/2018 0517 Gross per 24 hour  Intake 1954.67 ml  Output 2075 ml  Net -120.33 ml   Filed Weights   04/24/18 0113 04/25/18 0215 04/26/18 0337  Weight: 70.1 kg 72.3 kg 71.5 kg    Exam:   General:  No distress. Looks chronically ill. No change in exam   Cardiovascular: s1,s2 rrr  Respiratory: no wheezing   Abdomen: soft, NT  Musculoskeletal: decub ulcers.    Data Reviewed: Basic Metabolic Panel: Recent Labs  Lab 04/19/18 1350 04/19/18 1654 04/20/18 0607 04/20/18 1800 04/21/18 0342 04/23/18 0425  NA  --   --  141  --  139 141  K  --   --  3.3*  --  3.5 4.7  CL  --   --  108  --  108  105  CO2  --   --  27  --  25 28  GLUCOSE  --   --  122*  --  109* 111*  BUN  --   --  36*  --  31* 32*  CREATININE  --   --  0.33*  --  <0.30* 0.33*  CALCIUM  --   --  8.7*  --  8.8* 8.7*  MG  --   --  2.1  --   --  2.1  PHOS 2.4* 2.4* 2.4* 2.4*  --   --    Liver Function Tests: Recent Labs  Lab 04/20/18 0607 04/23/18 0425  AST 16 42*  ALT 21 15  ALKPHOS 43 50  BILITOT 0.9 1.1  PROT 5.0* 5.2*  ALBUMIN 2.0* 2.1*   No results for input(s): LIPASE, AMYLASE in the last 168 hours. No results for input(s): AMMONIA in the last 168 hours. CBC: Recent Labs  Lab 04/20/18 0607 04/21/18 0342 04/23/18 0425  WBC 6.4 7.6 7.3  HGB 8.2* 8.8* 8.2*  HCT 29.8* 31.6* 29.6*  MCV 97.7 95.2 98.0  PLT 151 178 160   Cardiac Enzymes: No results for input(s): CKTOTAL, CKMB, CKMBINDEX, TROPONINI in the last 168 hours. BNP (last 3 results) No results for input(s): BNP in the last 8760 hours.  ProBNP (last 3 results) No results for input(s): PROBNP in the last 8760 hours.  CBG: Recent Labs  Lab 04/25/18 1219 04/25/18 1609 04/25/18 2009 04/26/18 0323 04/26/18 0756  GLUCAP 96 112* 105* 98 100*    Recent Results (from the past 240 hour(s))  Blood Culture (routine x 2)     Status: None   Collection Time: 04/18/18 12:15 PM  Result Value Ref Range Status   Specimen Description BLOOD PICC LINE  Final   Special Requests   Final    BOTTLES DRAWN AEROBIC AND ANAEROBIC Blood Culture adequate volume   Culture   Final    NO GROWTH 5 DAYS Performed at [facility], [place] [address] [place], [place] [address]    Report Status 04/25/2018 FINAL  Final  Blood Culture (routine x 2)     Status: Abnormal   Collection Time: 04/18/18  3:05 PM  Result Value Ref Range Status   Specimen Description   Final    BLOOD RIGHT HAND Performed at [facility] [address] [place], [place] [address]    Special Requests   Final    BOTTLES DRAWN AEROBIC ONLY Blood Culture results  may not be optimal due to an inadequate volume of blood received in culture bottles Performed at [facility] [address], [place], [place] [address]    Culture  Setup Time   Final    GRAM POSITIVE COCCI AEROBIC BOTTLE ONLY CRITICAL RESULT CALLED TO, READ BACK BY AND VERIFIED WITH: L POINDEXTER PHARMD 0201 04/20/18 A BROWNING    Culture (A)  Final    STAPHYLOCOCCUS SPECIES (COAGULASE NEGATIVE) THE SIGNIFICANCE OF ISOLATING THIS ORGANISM FROM A SINGLE SET OF BLOOD CULTURES WHEN MULTIPLE SETS ARE DRAWN IS UNCERTAIN. PLEASE NOTIFY THE MICROBIOLOGY DEPARTMENT WITHIN ONE WEEK IF SPECIATION AND SENSITIVITIES ARE REQUIRED. Performed at [facility], [place] [address], [place], [place] [address]    Report Status 04/22/2018 FINAL  Final  Blood Culture ID Panel (Reflexed)     Status: None   Collection Time: 04/18/18  3:05 PM  Result Value Ref Range Status   Enterococcus species NOT DETECTED NOT DETECTED Final   Listeria monocytogenes NOT DETECTED NOT DETECTED Final   Staphylococcus species NOT DETECTED NOT DETECTED Final   Staphylococcus aureus (BCID) NOT DETECTED NOT DETECTED Final   Streptococcus species NOT DETECTED NOT DETECTED Final   Streptococcus agalactiae NOT DETECTED NOT DETECTED Final   Streptococcus pneumoniae NOT DETECTED NOT DETECTED Final   Streptococcus pyogenes NOT DETECTED NOT DETECTED Final   Acinetobacter baumannii NOT DETECTED NOT DETECTED Final   Enterobacteriaceae species NOT DETECTED NOT DETECTED Final   Enterobacter cloacae complex NOT DETECTED NOT DETECTED Final   Escherichia coli NOT DETECTED NOT DETECTED Final   Klebsiella oxytoca NOT DETECTED NOT DETECTED Final   Klebsiella pneumoniae NOT DETECTED NOT DETECTED Final   Proteus species NOT DETECTED NOT DETECTED Final   Serratia marcescens NOT DETECTED NOT DETECTED Final   Haemophilus influenzae NOT DETECTED NOT DETECTED Final   Neisseria meningitidis NOT DETECTED NOT DETECTED Final    Pseudomonas aeruginosa NOT DETECTED NOT DETECTED Final   Candida albicans NOT DETECTED NOT DETECTED Final   Candida glabrata NOT DETECTED NOT DETECTED Final   Candida krusei NOT DETECTED NOT DETECTED Final   Candida parapsilosis NOT DETECTED NOT DETECTED Final   Candida tropicalis NOT DETECTED NOT DETECTED Final    Comment: Performed at [facility], [place] [address], [place], [place] [address]  Urine culture     Status: None   Collection Time: 04/18/18  3:34 PM  Result Value Ref Range Status   Specimen Description   Final    URINE, RANDOM Performed at [place] [address], [place], [place] [address]    Special Requests   Final    NONE Performed at [facility], [place] [address], [place], [place] [address]    Culture   Final    NO GROWTH Performed at [facility], [place] [address] [place], [place] [address]    Report Status 04/19/2018 FINAL  Final  Culture, respiratory (non-expectorated)     Status: None   Collection Time: 04/19/18  3:48 PM  Result Value Ref Range Status   Specimen Description   Final    TRACHEAL ASPIRATE Performed at [facility] [address], [place], [place] [address]    Special Requests   Final    NONE Performed at [facility], [place] [address], [place], [place] [address]    Gram Stain   Final    ABUNDANT WBC PRESENT,BOTH PMN AND MONONUCLEAR ABUNDANT GRAM NEGATIVE RODS Performed at [facility], [place] [address], [place], [place] [address]    Culture FEW PSEUDOMONAS AERUGINOSA  Final   Report Status 04/22/2018 FINAL  Final   Organism ID, Bacteria PSEUDOMONAS AERUGINOSA  Final      Susceptibility   Pseudomonas aeruginosa - MIC*    CEFTAZIDIME 8 SENSITIVE Sensitive     CIPROFLOXACIN >=4 RESISTANT Resistant     GENTAMICIN 8 INTERMEDIATE Intermediate     IMIPENEM >=16 RESISTANT Resistant     PIP/TAZO 16 SENSITIVE Sensitive     CEFEPIME 16 INTERMEDIATE  Intermediate     * FEW PSEUDOMONAS AERUGINOSA  MRSA PCR Screening     Status: Abnormal   Collection Time: 04/20/18  6:08 PM  Result Value Ref Range Status   MRSA by PCR POSITIVE (A) NEGATIVE Final    Comment:        The GeneXpert MRSA Assay (FDA approved for NASAL specimens only), is one component of a comprehensive MRSA colonization surveillance program. It is not intended to diagnose MRSA infection nor to guide or monitor treatment for MRSA infections. RESULT CALLED TO, READ BACK BY AND VERIFIED WITH: B.BLACKBURN AT 2101 ON 04/20/18 BY N.THOMPSON Performed at [facility], [place] [address] [place], [place] [address]   Culture, respiratory (non-expectorated)     Status: None   Collection Time: 04/21/18  3:38 PM  Result Value Ref Range Status   Specimen Description   Final    TRACHEAL ASPIRATE Performed at [facility] [place] [address], [place], [place] [address]    Special Requests   Final    Immunocompromised Performed at [facility], [place] [address], [place], [place] [address]    Gram Stain   Final    NO WBC SEEN FEW GRAM VARIABLE ROD Performed at [facility], [place] [address] [place], [place] [address]    Culture ABUNDANT PSEUDOMONAS AERUGINOSA  Final   Report Status 04/24/2018 FINAL  Final   Organism ID, Bacteria PSEUDOMONAS AERUGINOSA  Final      Susceptibility   Pseudomonas aeruginosa - MIC*    CEFTAZIDIME 4 SENSITIVE Sensitive     CIPROFLOXACIN 2 INTERMEDIATE Intermediate     GENTAMICIN 8 INTERMEDIATE Intermediate     IMIPENEM >=16 RESISTANT Resistant     PIP/TAZO 8 SENSITIVE Sensitive     CEFEPIME 16 INTERMEDIATE Intermediate     * ABUNDANT PSEUDOMONAS AERUGINOSA  C difficile quick scan w PCR reflex     Status: Abnormal   Collection Time: 04/24/18 10:10 PM  Result Value Ref Range Status   C Diff antigen POSITIVE (A) NEGATIVE Final   C Diff toxin NEGATIVE NEGATIVE Final   C Diff interpretation  Results are indeterminate. See PCR results.  Final    Comment: Performed at [facility], [place] [address], [place], [place] [address]  C. Diff by PCR, Reflexed     Status: Abnormal   Collection Time: 04/24/18 10:10 PM  Result Value Ref Range Status   Toxigenic C. Difficile by PCR POSITIVE (A) NEGATIVE Final    Comment: Positive for toxigenic C. difficile with little to no toxin production. Only treat if clinical presentation suggests symptomatic illness. Performed at [facility], [place] [address] [place], [place] [address]      Studies: No results found.  Scheduled Meds: . apixaban  2.5 mg Per Tube BID  . aspirin  81 mg Oral Daily  . carvedilol  6.25 mg Oral BID WC  . chlorhexidine gluconate (MEDLINE KIT)  15 mL Mouth Rinse BID  . Chlorhexidine Gluconate Cloth  6 each Topical Daily  . collagenase   Topical Daily  . famotidine  20 mg Oral QHS  . feeding supplement (PRO-STAT SUGAR FREE 64)  30 mL Per Tube BID  . feeding supplement (VITAL HIGH PROTEIN)  1,000 mL Per Tube Q24H  . free water  200 mL Per Tube Q8H  . furosemide  40 mg Oral Daily  . levETIRAcetam  1,000 mg Oral BID  . mouth rinse  15 mL Mouth Rinse 10 times per day  . multivitamin  15 mL Per Tube Daily  . nutrition supplement (JUVEN)  1 packet Per Tube BID BM  . pantoprazole sodium  40 mg Per Tube Daily  . sodium chloride flush  10-40 mL Intracatheter Q12H  . sodium chloride flush  3 mL Intravenous Q12H  . valproic acid  500 mg Oral BID  . vancomycin  125 mg Oral QID   Continuous Infusions:  Principal Problem:   Acute encephalopathy Active Problems:   Essential (primary) hypertension   History of cardiac catheterization   Hyperlipidemia   DVT (deep venous thrombosis) (HCC)   Venous ulcer of ankle (HCC)   Seizures (HCC)   A-fib (HCC)   CVA (cerebral vascular accident) ([place])   Chronic respiratory failure ([place])   Acute respiratory failure with hypoxia ([place])    Time spent: >35 minutes      Melissa Cochran  [facility] Pager [phone]. If 7PM-7AM, please contact night-coverage at [email or web address], password [facility] 04/26/2018, 8:28 AM  LOS: 8 days

## 2018-04-27 DIAGNOSIS — Z7189 Other specified counseling: Secondary | ICD-10-CM

## 2018-04-27 DIAGNOSIS — Z515 Encounter for palliative care: Secondary | ICD-10-CM

## 2018-04-27 LAB — GLUCOSE, CAPILLARY
Glucose-Capillary: 98 mg/dL (ref 70–99)
Glucose-Capillary: 106 mg/dL — ABNORMAL HIGH (ref 70–99)
Glucose-Capillary: 108 mg/dL — ABNORMAL HIGH (ref 70–99)
Glucose-Capillary: 104 mg/dL — ABNORMAL HIGH (ref 70–99)
Glucose-Capillary: 91 mg/dL (ref 70–99)

## 2018-04-27 NOTE — Progress Notes (Signed)
Patient still had no PCR results from C.Diff test performed on 12/16. Called lab at [facility]. PCR is positive. Copy of results are in pt shadow chart.

## 2018-04-27 NOTE — Progress Notes (Signed)
Left message for SNF admissions director Pershing Proud.   Confirmed with Tye Maryland, Scientist, physiological, that patient can return if no changes is needs. Will fax clinicals.   LCSW will continue to follow.   Carolin Coy [place]

## 2018-04-27 NOTE — Progress Notes (Signed)
Palliative:  Ms. Goga is much unchanged: vent dependent with minimal support and minimally responsive with brief eye opening but not following commands. I called to touch base with daughter, [name]. I educated [name] that there has not been any changes. [name] says she has spoken with [facility] and has settled that her mother should be able to return to [facility] which she is pleased with this information. Goals are for continued full, aggressive care. Palliative will shadow chart for decline but no further palliative needs at this time.   Exam: Minimally responsive. No following commands. On vent via trach. No distress.   15 min  Vinie Sill, NP Palliative Medicine Team Pager # [phone] (M-F 8a-5p) Team Phone # [phone] (Nights/Weekends)

## 2018-04-27 NOTE — Progress Notes (Signed)
[facility] PROGRESS NOTE  Melissa Cochran RWE:[id number] DOB: 02-06-1936 DOA: 04/18/2018 PCP: [name], MD  Brief summary   82 year old with past medical history relevant for chronic atrial fibrillation on apixaban, hypertension, coronary artery disease status post stent in 6195, chronic systolic heart failure with EF of 45 to 50% by echo on 06/28/2017, nephrolithiasis complicated by hydronephrosis status post stent placement in 2017, history of bilateral hydronephrosis, CVA, seizure disorder complicated by chronic respiratory failure with ventilator dependence and PEG tube placement who was admitted from [facility] for lethargy and electrolyte imbalances, found to have C diff  Assessment/Plan:  Acute metabolic encephalopathy:   her baseline is unclear though reportedly she was able to speak using a Passy-Muir valve at [facility] and was working on swallowing.  Her encephalopathy has almost completely resolved.  At this time it seems to be likely secondary to decreasing levels are antiepileptics due to meropenem given at [facility] for possible hospital-acquired pneumonia as well as electrolyte abnormalities including hyERonatremia. -EEG on 04/19/2018 shows occasional sharp activity over right frontotemporal region which neurology is attributing to a meningioma. B12 and folate replete, RPR negative. Continue antiepileptics per below. Brain MRI shows no acute process but does show old stroke and meningioma.  -unfortunately she has run out of Medicare days and so cannot go back to [facility]. Awaiting placement   Possible recent pneumonia/bacterial tracheitis: Patient reportedly was treated for pneumonia at [facility] with meropenem and vancomycin.  There is no culture results from this that of return.  Of note there was a concern that meropenem could decrease the valproic acid level and cause patient to have a seizure.  At this time the relevance of this respiratory culture from 04/19/2018  growing Pseudomonas is unclear particularly the patient does not have any other infectious symptoms and a procalcitonin is quite low. blood cultures on 04/18/2018 growing 1 out of 2 coagulase-negative staph. Respiratory culture from 04/19/2018 growing Pseudomonas. Repeat respiratory culture from 04/21/2018 no growth to date. Procalcitonin was undetectable on admission  C diff colitis. Likely due to recent antibiotic use at [facility]. Started PO vanc. 12/16>>. Monitor   Seizure disorder: Restart levetiracetam thousand milligrams twice daily by tube. Restarted valproic acid 250 mg twice daily by tube  Torticollis. Cont physical therapy  Chronic respiratory failure status post tracheostomy: PCCM following, appreciate t recommendations  Hypernatremia with PEG tube dependence: Resolved with free water flushes and D5W. Continue free water flushes 200 mL's every 8 hours. Vital 1.5 @ 40 mL/hr with 30 mL Prostat BID  Chronic atrial fibrillation complicated by CVA:. Continue beta-blocker. Continue apixaban 2.5 mg twice daily  Hypertension/hyperlipidemia/coronary artery disease status post stent: Continue aspirin 81 mg. continue carvedilol 6.25 mg twice daily  Anemia: This appears to be stable at this time.  Suspect most likely iron deficiency anemia and anemia of chronic disease.  Nephrolithiasis/hydronephrosis status post stents: Currently her creatinine is normal. Low threshold to repeat CT abdomen   Chronic systolic heart failure: Last echo in February showed EF of 40 to 45%. continue furosemide 40 mg daily via tube  Prognosis is guarded with stroke, related encephalopathy, seizure, chronic heart failure, respiratory failure, vent dependant, associated with infections, at risk for recurrent infection.  -full code per palliative care +family discussions   Code Status: full Family Communication:  D/w patient, RN (indicate person spoken with, relationship, and if by phone, the number) Disposition  Plan: pend LTEC placement    Consultants:  PCCM  Neurology  Procedures:  04/19/2018 KDT:OIZT is  an abnormal electroencephalogram due tooccasionalsharp activity over the rightfrontotemporal region. This issuggestive ofa focal disturbance over right hemisphere with epileptogenic potential.  04/22/2018 EEG:   Antibiotics: Anti-infectives (From admission, onward)   Start     Dose/Rate Route Frequency Ordered Stop   04/25/18 1000  vancomycin (VANCOCIN) 50 mg/mL oral solution 125 mg     125 mg Oral 4 times daily 04/25/18 0853 05/05/18 0959   04/23/18 0800  piperacillin-tazobactam (ZOSYN) IVPB 3.375 g  Status:  Discontinued     3.375 g 12.5 mL/hr over 240 Minutes Intravenous Every 8 hours 04/23/18 0726 04/23/18 0951   04/23/18 0730  piperacillin-tazobactam (ZOSYN) IVPB 3.375 g  Status:  Discontinued     3.375 g 100 mL/hr over 30 Minutes Intravenous Every 6 hours 04/23/18 0723 04/23/18 0726       (indicate start date, and stop date if known)  HPI/Subjective: Remains unchanged. Follows commands minimally. No acute distress. Stable on vent. Afebrile. Found to have C diff.   Objective: Vitals:   04/27/18 0737 04/27/18 0800  BP:  139/90  Pulse: 80 82  Resp: 13 16  Temp:  98.6 F (37 C)  SpO2: 98% 99%    Intake/Output Summary (Last 24 hours) at 04/27/2018 0903 Last data filed at 04/27/2018 0456 Gross per 24 hour  Intake 360 ml  Output 2275 ml  Net -1915 ml   Filed Weights   04/25/18 0215 04/26/18 0337 04/27/18 0451  Weight: 72.3 kg 71.5 kg 71.6 kg    Exam:   General:  No distress. Looks chronically ill. No change in exam   Cardiovascular: s1,s2 rrr  Respiratory: no wheezing   Abdomen: soft, NT  Musculoskeletal: decub ulcers.    Data Reviewed: Basic Metabolic Panel: Recent Labs  Lab 04/20/18 1800 04/21/18 0342 04/23/18 0425  NA  --  139 141  K  --  3.5 4.7  CL  --  108 105  CO2  --  25 28  GLUCOSE  --  109* 111*  BUN  --  31* 32*  CREATININE   --  <0.30* 0.33*  CALCIUM  --  8.8* 8.7*  MG  --   --  2.1  PHOS 2.4*  --   --    Liver Function Tests: Recent Labs  Lab 04/23/18 0425  AST 42*  ALT 15  ALKPHOS 50  BILITOT 1.1  PROT 5.2*  ALBUMIN 2.1*   No results for input(s): LIPASE, AMYLASE in the last 168 hours. No results for input(s): AMMONIA in the last 168 hours. CBC: Recent Labs  Lab 04/21/18 0342 04/23/18 0425  WBC 7.6 7.3  HGB 8.8* 8.2*  HCT 31.6* 29.6*  MCV 95.2 98.0  PLT 178 160   Cardiac Enzymes: No results for input(s): CKTOTAL, CKMB, CKMBINDEX, TROPONINI in the last 168 hours. BNP (last 3 results) No results for input(s): BNP in the last 8760 hours.  ProBNP (last 3 results) No results for input(s): PROBNP in the last 8760 hours.  CBG: Recent Labs  Lab 04/26/18 1635 04/26/18 1951 04/26/18 2335 04/27/18 0324 04/27/18 0744  GLUCAP 106* 111* 102* 98 106*    Recent Results (from the past 240 hour(s))  Blood Culture (routine x 2)     Status: None   Collection Time: 04/18/18 12:15 PM  Result Value Ref Range Status   Specimen Description BLOOD PICC LINE  Final   Special Requests   Final    BOTTLES DRAWN AEROBIC AND ANAEROBIC Blood Culture adequate volume   Culture  Final    NO GROWTH 5 DAYS Performed at [facility], [place] [address], [place], [place] [address]    Report Status 04/25/2018 FINAL  Final  Blood Culture (routine x 2)     Status: Abnormal   Collection Time: 04/18/18  3:05 PM  Result Value Ref Range Status   Specimen Description   Final    BLOOD RIGHT HAND Performed at [facility] [address] [place], [place] [address]    Special Requests   Final    BOTTLES DRAWN AEROBIC ONLY Blood Culture results may not be optimal due to an inadequate volume of blood received in culture bottles Performed at [facility] [address], [place], [place] [address]    Culture  Setup Time   Final    GRAM POSITIVE COCCI AEROBIC BOTTLE  ONLY CRITICAL RESULT CALLED TO, READ BACK BY AND VERIFIED WITH: L POINDEXTER PHARMD 0201 04/20/18 A BROWNING    Culture (A)  Final    STAPHYLOCOCCUS SPECIES (COAGULASE NEGATIVE) THE SIGNIFICANCE OF ISOLATING THIS ORGANISM FROM A SINGLE SET OF BLOOD CULTURES WHEN MULTIPLE SETS ARE DRAWN IS UNCERTAIN. PLEASE NOTIFY THE MICROBIOLOGY DEPARTMENT WITHIN ONE WEEK IF SPECIATION AND SENSITIVITIES ARE REQUIRED. Performed at [facility], [place] [address] [place], [place] [address]    Report Status 04/22/2018 FINAL  Final  Blood Culture ID Panel (Reflexed)     Status: None   Collection Time: 04/18/18  3:05 PM  Result Value Ref Range Status   Enterococcus species NOT DETECTED NOT DETECTED Final   Listeria monocytogenes NOT DETECTED NOT DETECTED Final   Staphylococcus species NOT DETECTED NOT DETECTED Final   Staphylococcus aureus (BCID) NOT DETECTED NOT DETECTED Final   Streptococcus species NOT DETECTED NOT DETECTED Final   Streptococcus agalactiae NOT DETECTED NOT DETECTED Final   Streptococcus pneumoniae NOT DETECTED NOT DETECTED Final   Streptococcus pyogenes NOT DETECTED NOT DETECTED Final   Acinetobacter baumannii NOT DETECTED NOT DETECTED Final   Enterobacteriaceae species NOT DETECTED NOT DETECTED Final   Enterobacter cloacae complex NOT DETECTED NOT DETECTED Final   Escherichia coli NOT DETECTED NOT DETECTED Final   Klebsiella oxytoca NOT DETECTED NOT DETECTED Final   Klebsiella pneumoniae NOT DETECTED NOT DETECTED Final   Proteus species NOT DETECTED NOT DETECTED Final   Serratia marcescens NOT DETECTED NOT DETECTED Final   Haemophilus influenzae NOT DETECTED NOT DETECTED Final   Neisseria meningitidis NOT DETECTED NOT DETECTED Final   Pseudomonas aeruginosa NOT DETECTED NOT DETECTED Final   Candida albicans NOT DETECTED NOT DETECTED Final   Candida glabrata NOT DETECTED NOT DETECTED Final   Candida krusei NOT DETECTED NOT DETECTED Final   Candida parapsilosis NOT DETECTED NOT  DETECTED Final   Candida tropicalis NOT DETECTED NOT DETECTED Final    Comment: Performed at [facility], [place] [address] [place], [place] [address]  Urine culture     Status: None   Collection Time: 04/18/18  3:34 PM  Result Value Ref Range Status   Specimen Description   Final    URINE, RANDOM Performed at [facility] [address] [place], [place] [address]    Special Requests   Final    NONE Performed at [facility], [place] [address], [place], [place] [address]    Culture   Final    NO GROWTH Performed at [facility], [place] [address] [place], [place] [address]    Report Status  04/19/2018 FINAL  Final  Culture, respiratory (non-expectorated)     Status: None   Collection Time: 04/19/18  3:48 PM  Result Value Ref Range Status   Specimen Description   Final    TRACHEAL ASPIRATE Performed at [facility] [address] [place], [place] [address]    Special Requests   Final    NONE Performed at [facility], [place] [address] [place], [place] [address]    Gram Stain   Final    ABUNDANT WBC PRESENT,BOTH PMN AND MONONUCLEAR ABUNDANT GRAM NEGATIVE RODS Performed at [facility], [place] [address] [place], [place] [address]    Culture FEW PSEUDOMONAS AERUGINOSA  Final   Report Status 04/22/2018 FINAL  Final   Organism ID, Bacteria PSEUDOMONAS AERUGINOSA  Final      Susceptibility   Pseudomonas aeruginosa - MIC*    CEFTAZIDIME 8 SENSITIVE Sensitive     CIPROFLOXACIN >=4 RESISTANT Resistant     GENTAMICIN 8 INTERMEDIATE Intermediate     IMIPENEM >=16 RESISTANT Resistant     PIP/TAZO 16 SENSITIVE Sensitive     CEFEPIME 16 INTERMEDIATE Intermediate     * FEW PSEUDOMONAS AERUGINOSA  MRSA PCR Screening     Status: Abnormal   Collection Time: 04/20/18  6:08 PM  Result Value Ref Range Status   MRSA by PCR POSITIVE (A) NEGATIVE Final    Comment:        The GeneXpert MRSA Assay  (FDA approved for NASAL specimens only), is one component of a comprehensive MRSA colonization surveillance program. It is not intended to diagnose MRSA infection nor to guide or monitor treatment for MRSA infections. RESULT CALLED TO, READ BACK BY AND VERIFIED WITH: B.BLACKBURN AT 2101 ON 04/20/18 BY N.THOMPSON Performed at [facility], [place] [address], [place], [place] [address]   Culture, respiratory (non-expectorated)     Status: None   Collection Time: 04/21/18  3:38 PM  Result Value Ref Range Status   Specimen Description   Final    TRACHEAL ASPIRATE Performed at [facility] [address], [place], [place] [address]    Special Requests   Final    Immunocompromised Performed at [facility], [place] [address], [place], [place] [address]    Gram Stain   Final    NO WBC SEEN FEW GRAM VARIABLE ROD Performed at [facility], [place] [address], [place], [place] [address]    Culture ABUNDANT PSEUDOMONAS AERUGINOSA  Final   Report Status 04/24/2018 FINAL  Final   Organism ID, Bacteria PSEUDOMONAS AERUGINOSA  Final      Susceptibility   Pseudomonas aeruginosa - MIC*    CEFTAZIDIME 4 SENSITIVE Sensitive     CIPROFLOXACIN 2 INTERMEDIATE Intermediate     GENTAMICIN 8 INTERMEDIATE Intermediate     IMIPENEM >=16 RESISTANT Resistant     PIP/TAZO 8 SENSITIVE Sensitive     CEFEPIME 16 INTERMEDIATE Intermediate     * ABUNDANT PSEUDOMONAS AERUGINOSA  C difficile quick scan w PCR reflex     Status: Abnormal   Collection Time: 04/24/18 10:10 PM  Result Value Ref Range Status   C Diff antigen POSITIVE (A) NEGATIVE Final   C Diff toxin NEGATIVE NEGATIVE Final   C Diff interpretation Results are indeterminate. See PCR results.  Final    Comment: Performed at [facility], [place] [address], [place], [place] [address]  C. Diff by PCR, Reflexed     Status:  Abnormal   Collection Time: 04/24/18 10:10 PM  Result  Value Ref Range Status   Toxigenic C. Difficile by PCR POSITIVE (A) NEGATIVE Final    Comment: Positive for toxigenic C. difficile with little to no toxin production. Only treat if clinical presentation suggests symptomatic illness. Performed at [facility], [place] [address] [place], [place] [address]      Studies: No results found.  Scheduled Meds: . apixaban  2.5 mg Per Tube BID  . aspirin  81 mg Oral Daily  . carvedilol  6.25 mg Oral BID WC  . chlorhexidine gluconate (MEDLINE KIT)  15 mL Mouth Rinse BID  . Chlorhexidine Gluconate Cloth  6 each Topical Daily  . collagenase   Topical Daily  . famotidine  20 mg Oral QHS  . feeding supplement (PRO-STAT SUGAR FREE 64)  30 mL Per Tube BID  . feeding supplement (VITAL HIGH PROTEIN)  1,000 mL Per Tube Q24H  . free water  200 mL Per Tube Q8H  . furosemide  40 mg Oral Daily  . levETIRAcetam  1,000 mg Oral BID  . mouth rinse  15 mL Mouth Rinse 10 times per day  . multivitamin  15 mL Per Tube Daily  . nutrition supplement (JUVEN)  1 packet Per Tube BID BM  . pantoprazole sodium  40 mg Per Tube Daily  . sodium chloride flush  10-40 mL Intracatheter Q12H  . sodium chloride flush  3 mL Intravenous Q12H  . valproic acid  500 mg Oral BID  . vancomycin  125 mg Oral QID   Continuous Infusions:  Principal Problem:   Acute encephalopathy Active Problems:   Essential (primary) hypertension   History of cardiac catheterization   Hyperlipidemia   DVT (deep venous thrombosis) (HCC)   Venous ulcer of ankle (HCC)   Seizures (HCC)   A-fib (HCC)   CVA (cerebral vascular accident) ([place])   Chronic respiratory failure ([place])   Acute respiratory failure with hypoxia ([place])    Time spent: >35 minutes     Kinnie Feil  [facility] Pager [phone]. If 7PM-7AM, please contact night-coverage at [email or web address], password [facility] 04/27/2018, 9:03 AM  LOS: 9 days

## 2018-04-28 LAB — COMPREHENSIVE METABOLIC PANEL
ALT: 12 U/L (ref 0–44)
AST: 14 U/L — ABNORMAL LOW (ref 15–41)
Albumin: 2.2 g/dL — ABNORMAL LOW (ref 3.5–5.0)
Alkaline Phosphatase: 51 U/L (ref 38–126)
Anion gap: 13 (ref 5–15)
BILIRUBIN TOTAL: 0.4 mg/dL (ref 0.3–1.2)
BUN: 34 mg/dL — ABNORMAL HIGH (ref 8–23)
CO2: 29 mmol/L (ref 22–32)
Calcium: 9.4 mg/dL (ref 8.9–10.3)
Chloride: 100 mmol/L (ref 98–111)
Creatinine, Ser: 0.33 mg/dL — ABNORMAL LOW (ref 0.44–1.00)
GFR calc Af Amer: >60 mL/min (ref >60)
GFR calc non Af Amer: >60 mL/min (ref >60)
Glucose, Bld: 119 mg/dL — ABNORMAL HIGH (ref 70–99)
Potassium: 3.2 mmol/L — ABNORMAL LOW (ref 3.5–5.1)
Sodium: 142 mmol/L (ref 135–145)
Total Protein: 5.2 g/dL — ABNORMAL LOW (ref 6.5–8.1)

## 2018-04-28 LAB — CBC
HEMATOCRIT: 30.9 % — AB (ref 36.0–46.0)
HEMOGLOBIN: 8.5 g/dL — AB (ref 12.0–15.0)
MCH: 27.2 pg (ref 26.0–34.0)
MCHC: 27.5 g/dL — ABNORMAL LOW (ref 30.0–36.0)
MCV: 99 fL (ref 80.0–100.0)
Platelets: 220 10*3/uL (ref 150–400)
RBC: 3.12 MIL/uL — ABNORMAL LOW (ref 3.87–5.11)
RDW: 21 % — ABNORMAL HIGH (ref 11.5–15.5)
WBC: 6 10*3/uL (ref 4.0–10.5)
nRBC: 0 % (ref 0.0–0.2)

## 2018-04-28 LAB — GLUCOSE, CAPILLARY
Glucose-Capillary: 110 mg/dL — ABNORMAL HIGH (ref 70–99)
Glucose-Capillary: 120 mg/dL — ABNORMAL HIGH (ref 70–99)
Glucose-Capillary: 85 mg/dL (ref 70–99)
Glucose-Capillary: 86 mg/dL (ref 70–99)
Glucose-Capillary: 90 mg/dL (ref 70–99)
Glucose-Capillary: 94 mg/dL (ref 70–99)
Glucose-Capillary: 96 mg/dL (ref 70–99)

## 2018-04-28 MED ORDER — POTASSIUM CHLORIDE CRYS ER 20 MEQ PO TBCR
40.0000 meq | EXTENDED_RELEASE_TABLET | Freq: Once | ORAL | Status: AC
  Administered 2018-04-28: 40 meq via ORAL
  Filled 2018-04-28: qty 2

## 2018-04-28 MED ORDER — POTASSIUM CHLORIDE 10 MEQ/100ML IV SOLN
10.0000 meq | INTRAVENOUS | Status: AC
  Administered 2018-04-28 – 2018-04-29 (×2): 10 meq via INTRAVENOUS
  Filled 2018-04-28 (×2): qty 100

## 2018-04-28 NOTE — Progress Notes (Addendum)
LCSW following for SNF placement.   Patient from [facility].   LCSW spoke with Melissa Cochran, [phone] in admissions at [facility]. Patient can return when bed available. Facility anticipates having a bed Monday 12/23.  LCSW faxed updated clinicals to facility [phone].  LCSW will continue to follow.   Carolin Coy [place] [facility] [place]

## 2018-04-28 NOTE — Care Management Note (Signed)
Case Management Note  Patient Details  Name: LUDELL ZACARIAS MRN: [id number] Date of Birth: 1935/09/18  Subjective/Objective:                  Vent dependent patient awaiting snf vent placement/ vs stable/  Action/Plan: csw working on placment/csw a.d. is aware of delay.  Expected Discharge Date:  04/23/18               Expected Discharge Plan:  Long Term Acute Care (LTAC)  In-House Referral:  NA  Discharge planning Services  CM Consult  Post Acute Care Choice:  Long Term Acute Care (LTAC), Resumption of Svcs/PTA Provider Choice offered to:  NA  DME Arranged:  N/A DME Agency:  NA  HH Arranged:  NA HH Agency:  NA  Status of Service:  Completed, signed off  If discussed at [place] of Stay Meetings, dates discussed:    Additional Comments:  Leeroy Cha, RN 04/28/2018, 9:56 AM

## 2018-04-28 NOTE — Progress Notes (Signed)
PROGRESS NOTE    REI CONTEE  QVZ:[id number] DOB: 1935/10/12 DOA: 04/18/2018 PCP: [name], MD   Brief Narrative:82 year old with past medical history relevant for chronic atrial fibrillation on apixaban, hypertension, coronary artery disease status post stent in 3295, chronic systolic heart failure with EF of 45 to 50% by echo on 06/28/2017, nephrolithiasis complicated by hydronephrosis status post stent placement in 2017, history of bilateral hydronephrosis, CVA, seizure disorder complicated by chronic respiratory failure with ventilator dependence and PEG tube placement who was admitted from [facility] for lethargy and electrolyte imbalances, found to have C diff  Assessment & Plan:   Principal Problem:   Acute encephalopathy Active Problems:   Essential (primary) hypertension   History of cardiac catheterization   Hyperlipidemia   DVT (deep venous thrombosis) (HCC)   Venous ulcer of ankle (HCC)   Seizures (HCC)   A-fib (HCC)   CVA (cerebral vascular accident) ([place])   Chronic respiratory failure ([place])   Acute respiratory failure with hypoxia ([place])   Acute metabolic encephalopathy: her baseline is unclear though reportedly she was able to speak using a Passy-Muir valveat [facility] and was working on swallowing.Her encephalopathy has almost completely resolved. At this time it seems to be likely secondary to decreasing levels are antiepileptics due to meropenem given at [facility] for possible hospital-acquired pneumonia as well as electrolyte abnormalities including hyERonatremia. -EEG on 04/19/2018 shows occasional sharp activity over right frontotemporal region which neurology is attributing to a meningioma. B12 and folate replete, RPR negative. Continue antiepileptics per below. Brain MRI shows no acute process but does show old stroke andmeningioma.   Awaiting placement   Possible recent pneumonia/bacterial tracheitis: Patient reportedly was treated for  pneumonia at [facility] with meropenem and vancomycin. There is no culture results from this that of return. Of note there was a concern that meropenem could decrease the valproic acid level and cause patient to have a seizure. At this time the relevance of this respiratory culture from 04/19/2018 growing Pseudomonas is unclear particularly the patient does not have any other infectious symptoms and a procalcitonin is quite low. blood cultures on 04/18/2018 growing 1 out of 2 coagulase-negative staph. Respiratory culture from 04/19/2018 growing Pseudomonas. Repeat respiratory culture from 04/21/2018 no growth to date. Procalcitonin was undetectable on admission  C diff colitis. Likely due to recent antibiotic use at [facility]. Started PO vanc. 12/16>>. Monitor   Seizure disorder: Restartlevetiracetam thousand milligrams twice daily by tube. Restartedvalproic acid 250 mg twice daily by tube  Torticollis. Cont physical therapy  Chronic respiratory failure status post tracheostomy: PCCM following, appreciate t recommendations  Hypernatremia with PEG tube dependence: Resolved with free water flushes and D5W. Continue free water flushes 200 mL's every 8 hours. Vital 1.5 @ 40 mL/hr with 30 mL Prostat BID  Chronic atrial fibrillation complicated by CVA:. Continue beta-blocker. Continue apixaban 2.5 mg twice daily  Hypertension/hyperlipidemia/coronary artery disease status post stent: Continue aspirin 81 mg. continue carvedilol 6.25 mg twice daily  Anemia: This appears to be stable at this time. Suspect most likely iron deficiency anemia and anemia of chronic disease.  Nephrolithiasis/hydronephrosis status post stents: Currently her creatinine is normal. Low threshold to repeat CT abdomen   Chronic systolic heart failure: Last echo in February showed EF of 40 to 45%. continue furosemide 40 mg daily via tube  Hypokalemia replete  RN Pressure Injury Documentation: Pressure Injury 04/18/18 Stage  IV - Full thickness tissue loss with exposed bone, tendon or muscle. (Active)  04/18/18 1814  Location: Vertebral  column  Location Orientation:   Staging: Stage IV - Full thickness tissue loss with exposed bone, tendon or muscle.  Wound Description (Comments):   Present on Admission: Yes     Pressure Injury 04/18/18 Unstageable - Full thickness tissue loss in which the base of the ulcer is covered by slough (yellow, tan, gray, green or brown) and/or eschar (tan, brown or black) in the wound bed. Has tunneling at 20 o clock, yellow slough aro (Active)  04/18/18 1815  Location: Sacrum  Location Orientation:   Staging: Unstageable - Full thickness tissue loss in which the base of the ulcer is covered by slough (yellow, tan, gray, green or brown) and/or eschar (tan, brown or black) in the wound bed.  Wound Description (Comments): Has tunneling at 66 o clock, yellow slough around edges of wound bed  Present on Admission: Yes    Malnutrition Type:  Nutrition Problem: Increased nutrient needs Etiology: wound healing   Malnutrition Characteristics:  Signs/Symptoms: estimated needs   Nutrition Interventions:  Interventions: Tube feeding, Prostat, MVI, Juven  Estimated body mass index is 27.36 kg/m as calculated from the following:   Height as of this encounter: '5\' 4"'$  (1.626 m).   Weight as of this encounter: 72.3 kg.  DVT prophylaxis: Eliquis Code Status: Full code Family Communication: None Disposition Plan await placement  Consultants: PCCM and neurology    Procedures:04/19/2018 TDH:RCBU is an abnormal electroencephalogram due tooccasionalsharp activity over the rightfrontotemporal region. This issuggestive ofa focal disturbance over right hemisphere with epileptogenic potential.  04/22/2018 EEG:   Antimicrobials: None  Subjective: Resting in bed trach in place in no acute distress Objective: Vitals:   04/28/18 0400 04/28/18 0746 04/28/18 0800 04/28/18 1200    BP: 134/78  (!) 91/44 (!) 111/49  Pulse: 77  72 68  Resp: '12  12 15  '$ Temp: 98.2 F (36.8 C)  (!) 97.2 F (36.2 C) 98.4 F (36.9 C)  TempSrc: Oral  Axillary Axillary  SpO2: 99% 100% 100% 100%  Weight: 72.3 kg     Height:        Intake/Output Summary (Last 24 hours) at 04/28/2018 1547 Last data filed at 04/28/2018 0556 Gross per 24 hour  Intake 3 ml  Output 825 ml  Net -822 ml   Filed Weights   04/26/18 0337 04/27/18 0451 04/28/18 0400  Weight: 71.5 kg 71.6 kg 72.3 kg    Examination:  General exam: Appears calm and comfortable  Respiratory system: Clear to auscultation. Respiratory effort normal. Cardiovascular system: S1 & S2 heard, RRR. No JVD, murmurs, rubs, gallops or clicks. No pedal edema. Gastrointestinal system: Abdomen is nondistended, soft and nontender. No organomegaly or masses felt. Normal bowel sounds heard. Central nervous system: Alert and oriented. No focal neurological deficits. Extremities: Symmetric 5 x 5 power. Skin: No rashes, lesions or ulcers Psychiatry: Judgement and insight appear normal. Mood & affect appropriate.     Data Reviewed: I have personally reviewed following labs and imaging studies  CBC: Recent Labs  Lab 04/23/18 0425 04/28/18 0827  WBC 7.3 6.0  HGB 8.2* 8.5*  HCT 29.6* 30.9*  MCV 98.0 99.0  PLT 160 384   Basic Metabolic Panel: Recent Labs  Lab 04/23/18 0425 04/28/18 0827  NA 141 142  K 4.7 3.2*  CL 105 100  CO2 28 29  GLUCOSE 111* 119*  BUN 32* 34*  CREATININE 0.33* 0.33*  CALCIUM 8.7* 9.4  MG 2.1  --    GFR: Estimated Creatinine Clearance: 54.6 mL/min (A) (by C-G  formula based on SCr of 0.33 mg/dL (L)). Liver Function Tests: Recent Labs  Lab 04/23/18 0425 04/28/18 0827  AST 42* 14*  ALT 15 12  ALKPHOS 50 51  BILITOT 1.1 0.4  PROT 5.2* 5.2*  ALBUMIN 2.1* 2.2*   No results for input(s): LIPASE, AMYLASE in the last 168 hours. No results for input(s): AMMONIA in the last 168 hours. Coagulation  Profile: No results for input(s): INR, PROTIME in the last 168 hours. Cardiac Enzymes: No results for input(s): CKTOTAL, CKMB, CKMBINDEX, TROPONINI in the last 168 hours. BNP (last 3 results) No results for input(s): PROBNP in the last 8760 hours. HbA1C: No results for input(s): HGBA1C in the last 72 hours. CBG: Recent Labs  Lab 04/27/18 2032 04/28/18 0029 04/28/18 0339 04/28/18 0830 04/28/18 1209  GLUCAP 91 94 85 90 110*   Lipid Profile: No results for input(s): CHOL, HDL, LDLCALC, TRIG, CHOLHDL, LDLDIRECT in the last 72 hours. Thyroid Function Tests: No results for input(s): TSH, T4TOTAL, FREET4, T3FREE, THYROIDAB in the last 72 hours. Anemia Panel: No results for input(s): VITAMINB12, FOLATE, FERRITIN, TIBC, IRON, RETICCTPCT in the last 72 hours. Sepsis Labs: No results for input(s): PROCALCITON, LATICACIDVEN in the last 168 hours.  Recent Results (from the past 240 hour(s))  Culture, respiratory (non-expectorated)     Status: None   Collection Time: 04/19/18  3:48 PM  Result Value Ref Range Status   Specimen Description   Final    TRACHEAL ASPIRATE Performed at [facility] [address] [place], [place] [address]    Special Requests   Final    NONE Performed at [facility], [place] [address], [place], [place] [address]    Gram Stain   Final    ABUNDANT WBC PRESENT,BOTH PMN AND MONONUCLEAR ABUNDANT GRAM NEGATIVE RODS Performed at [facility], [place] [address] [place], [place] [address]    Culture FEW PSEUDOMONAS AERUGINOSA  Final   Report Status 04/22/2018 FINAL  Final   Organism ID, Bacteria PSEUDOMONAS AERUGINOSA  Final      Susceptibility   Pseudomonas aeruginosa - MIC*    CEFTAZIDIME 8 SENSITIVE Sensitive     CIPROFLOXACIN >=4 RESISTANT Resistant     GENTAMICIN 8 INTERMEDIATE Intermediate     IMIPENEM >=16 RESISTANT Resistant     PIP/TAZO 16 SENSITIVE Sensitive     CEFEPIME 16 INTERMEDIATE Intermediate     *  FEW PSEUDOMONAS AERUGINOSA  MRSA PCR Screening     Status: Abnormal   Collection Time: 04/20/18  6:08 PM  Result Value Ref Range Status   MRSA by PCR POSITIVE (A) NEGATIVE Final    Comment:        The GeneXpert MRSA Assay (FDA approved for NASAL specimens only), is one component of a comprehensive MRSA colonization surveillance program. It is not intended to diagnose MRSA infection nor to guide or monitor treatment for MRSA infections. RESULT CALLED TO, READ BACK BY AND VERIFIED WITH: [email or web address] AT 2101 ON 04/20/18 BY N.THOMPSON Performed at [facility], [place] [address] [place], [place] [address]   Culture, respiratory (non-expectorated)     Status: None   Collection Time: 04/21/18  3:38 PM  Result Value Ref Range Status   Specimen Description   Final    TRACHEAL ASPIRATE Performed at [facility] [address] [place], [place] [address]    Special Requests   Final    Immunocompromised Performed at [facility], [place] [address]  [place], [place] [address]    Gram Stain   Final    NO WBC SEEN FEW GRAM VARIABLE ROD Performed at [facility], [place] [address], [place], [place] [address]    Culture ABUNDANT PSEUDOMONAS AERUGINOSA  Final   Report Status 04/24/2018 FINAL  Final   Organism ID, Bacteria PSEUDOMONAS AERUGINOSA  Final      Susceptibility   Pseudomonas aeruginosa - MIC*    CEFTAZIDIME 4 SENSITIVE Sensitive     CIPROFLOXACIN 2 INTERMEDIATE Intermediate     GENTAMICIN 8 INTERMEDIATE Intermediate     IMIPENEM >=16 RESISTANT Resistant     PIP/TAZO 8 SENSITIVE Sensitive     CEFEPIME 16 INTERMEDIATE Intermediate     * ABUNDANT PSEUDOMONAS AERUGINOSA  C difficile quick scan w PCR reflex     Status: Abnormal   Collection Time: 04/24/18 10:10 PM  Result Value Ref Range Status   C Diff antigen POSITIVE (A) NEGATIVE Final   C Diff toxin NEGATIVE NEGATIVE Final   C Diff interpretation Results are  indeterminate. See PCR results.  Final    Comment: Performed at [facility], [place] [address], [place], [place] [address]  C. Diff by PCR, Reflexed     Status: Abnormal   Collection Time: 04/24/18 10:10 PM  Result Value Ref Range Status   Toxigenic C. Difficile by PCR POSITIVE (A) NEGATIVE Final    Comment: Positive for toxigenic C. difficile with little to no toxin production. Only treat if clinical presentation suggests symptomatic illness. Performed at [facility], [place] [address] [place], [place] [address]          Radiology Studies: No results found.      Scheduled Meds: . apixaban  2.5 mg Per Tube BID  . aspirin  81 mg Oral Daily  . carvedilol  6.25 mg Oral BID WC  . chlorhexidine gluconate (MEDLINE KIT)  15 mL Mouth Rinse BID  . Chlorhexidine Gluconate Cloth  6 each Topical Daily  . collagenase   Topical Daily  . famotidine  20 mg Oral QHS  . feeding supplement (PRO-STAT SUGAR FREE 64)  30 mL Per Tube BID  . feeding supplement (VITAL HIGH PROTEIN)  1,000 mL Per Tube Q24H  . free water  200 mL Per Tube Q8H  . furosemide  40 mg Oral Daily  . levETIRAcetam  1,000 mg Oral BID  . mouth rinse  15 mL Mouth Rinse 10 times per day  . multivitamin  15 mL Per Tube Daily  . nutrition supplement (JUVEN)  1 packet Per Tube BID BM  . pantoprazole sodium  40 mg Per Tube Daily  . sodium chloride flush  10-40 mL Intracatheter Q12H  . sodium chloride flush  3 mL Intravenous Q12H  . valproic acid  500 mg Oral BID  . vancomycin  125 mg Oral QID   Continuous Infusions:   LOS: 10 days     Georgette Shell, MD [facility]  If 7PM-7AM, please contact night-coverage [email or web address] Password [facility] 04/28/2018, 3:47 PM

## 2018-04-29 LAB — GLUCOSE, CAPILLARY
GLUCOSE-CAPILLARY: 111 mg/dL — AB (ref 70–99)
Glucose-Capillary: 108 mg/dL — ABNORMAL HIGH (ref 70–99)
Glucose-Capillary: 95 mg/dL (ref 70–99)
Glucose-Capillary: 96 mg/dL (ref 70–99)
Glucose-Capillary: 97 mg/dL (ref 70–99)
Glucose-Capillary: 97 mg/dL (ref 70–99)

## 2018-04-29 NOTE — Progress Notes (Signed)
PROGRESS NOTE    Melissa Cochran  IRW:[id number] DOB: 1935-08-26 DOA: 04/18/2018 PCP: [name], MD    Brief Narrative68 year old with past medical history relevant for chronic atrial fibrillation on apixaban, hypertension, coronary artery disease status post stent in 7619, chronic systolic heart failure with EF of 45 to 50% by echo on 06/28/2017, nephrolithiasis complicated by hydronephrosis status post stent placement in 2017, history of bilateral hydronephrosis, CVA, seizure disorder complicated by chronic respiratory failure with ventilator dependence and PEG tube placement who was admitted from [facility] for lethargy and electrolyte imbalances, found to have C diff  Assessment & Plan:   Principal Problem:   Acute encephalopathy Active Problems:   Essential (primary) hypertension   History of cardiac catheterization   Hyperlipidemia   DVT (deep venous thrombosis) (HCC)   Venous ulcer of ankle (HCC)   Seizures (HCC)   A-fib (HCC)   CVA (cerebral vascular accident) ([place])   Chronic respiratory failure ([place])   Acute respiratory failure with hypoxia ([place])  Acute metabolic encephalopathy: her baseline is unclear though reportedly she was able to speak using a Passy-Muir valveat [facility] and was working on swallowing.Her encephalopathy has almost completely resolved. At this time it seems to be likely secondary to decreasing levels are antiepileptics due to meropenem given at [facility] for possible hospital-acquired pneumonia as well as electrolyte abnormalities including hyERonatremia. -EEG on 04/19/2018 shows occasional sharp activity over right frontotemporal region which neurology is attributing to a meningioma. B12 and folate replete, RPR negative. Continue antiepileptics per below. Brain MRI shows no acute process but does show old stroke andmeningioma.   Awaiting placement   Possible recent pneumonia/bacterial tracheitis: Patient reportedly was treated for  pneumonia at [facility] with meropenem and vancomycin. There is no culture results from this that of return. Of note there was a concern that meropenem could decrease the valproic acid level and cause patient to have a seizure. At this time the relevance of this respiratory culture from 04/19/2018 growing Pseudomonas is unclear particularly the patient does not have any other infectious symptoms and a procalcitonin is quite low. blood cultures on 04/18/2018 growing 1 out of 2 coagulase-negative staph. Respiratory culture from 04/19/2018 growing Pseudomonas. Repeat respiratory culture from 04/21/2018 no growth to date. Procalcitonin was undetectable on admission  C diff colitis.Likely due to recent antibiotic use at [facility]. StartedPO vanc. 12/16>>. Monitor   Seizure disorder:Restartlevetiracetam thousand milligrams twice daily by tube. Restartedvalproic acid 250 mg twice daily by tube  Torticollis. Contphysical therapy  Chronic respiratory failure status post tracheostomy: PCCM following, appreciate t recommendations  Hypernatremiawith PEG tube dependence: Resolved with free water flushes and D5W. Continue free water flushes 200 mL's every 8 hours. Vital 1.5 @ 40 mL/hr with 30 mL Prostat BID  Chronic atrial fibrillation complicated by CVA:. Continue beta-blocker. Continue apixaban 2.5 mg twice daily  Hypertension/hyperlipidemia/coronary artery disease status post stent: Continue aspirin 81 mg. continue carvedilol 6.25 mg twice daily  Anemia: This appears to be stable at this time. Suspect most likely iron deficiency anemia and anemia of chronic disease.  Nephrolithiasis/hydronephrosis status post stents: Currently her creatinine is normal. Low threshold to repeat CT abdomen   Chronic systolic heart failure: Last echo in February showed EF of 40 to 45%. continue furosemide 40 mg daily via tube  Hypokalemia replete  RN Pressure Injury Documentation: Pressure Injury 04/18/18 Stage  IV - Full thickness tissue loss with exposed bone, tendon or muscle. (Active)  04/18/18 1814  Location: Vertebral column  Location Orientation:  Staging: Stage IV - Full thickness tissue loss with exposed bone, tendon or muscle.  Wound Description (Comments):   Present on Admission: Yes     Pressure Injury 04/18/18 Unstageable - Full thickness tissue loss in which the base of the ulcer is covered by slough (yellow, tan, gray, green or brown) and/or eschar (tan, brown or black) in the wound bed. Has tunneling at 27 o clock, yellow slough aro (Active)  04/18/18 1815  Location: Sacrum  Location Orientation:   Staging: Unstageable - Full thickness tissue loss in which the base of the ulcer is covered by slough (yellow, tan, gray, green or brown) and/or eschar (tan, brown or black) in the wound bed.  Wound Description (Comments): Has tunneling at 39 o clock, yellow slough around edges of wound bed  Present on Admission: Yes    Malnutrition Type:  Nutrition Problem: Increased nutrient needs Etiology: wound healing   Malnutrition Characteristics:  Signs/Symptoms: estimated needs   Nutrition Interventions:  Interventions: Tube feeding, Prostat, MVI, Juven  Estimated body mass index is 27.81 kg/m as calculated from the following:   Height as of this encounter: 5' 4" (1.626 m).   Weight as of this encounter: 73.5 kg.  DVT prophylaxis ELIQUIS Code Status:FULL Family Communication:NONE Disposition Plan:AWAIT PLACEMENT  Consultants: PCCM and neurology   Procedures: :04/19/2018 LTE:IHDT is an abnormal electroencephalogram due tooccasionalsharp activity over the rightfrontotemporal region. This issuggestive ofa focal disturbance over right hemisphere with epileptogenic potential.  04/22/2018 EEG:   Antimicrobials: None  Subjective: Patient resting in bed opens her eyes when I called her name  Objective: Vitals:   04/29/18 1200 04/29/18 1223 04/29/18 1300 04/29/18 1400    BP: (!) 77/47   (!) 97/43  Pulse: 77  73 74  Resp: (!) _0 Temp:  98.4 F (36.9 C)    TempSrc:  Oral    SpO2: 99% 99% 100% 99%  Weight:      Height:        Intake/Output Summary (Last 24 hours) at 04/29/2018 1432 Last data filed at 04/29/2018 1000 Gross per 24 hour  Intake 3800 ml  Output 625 ml  Net 3175 ml   Filed Weights   04/27/18 0451 04/28/18 0400 04/29/18 0455  Weight: 71.6 kg 72.3 kg 73.5 kg    Examination: Trach/Foley catheter/rectal tube/PEG tube  General exam: Appears calm and comfortable  Respiratory system: Clear to auscultation. Respiratory effort normal.trach in place Cardiovascular system: S1 & S2 heard, RRR. No JVD, murmurs, rubs, gallops or clicks. No pedal edema. Gastrointestinal system: Abdomen is nondistended, soft and nontender. No organomegaly or masses felt. Normal bowel sounds heard. Extremities: 1+ pitting edema both lower extremities Skin: No rashes, lesions or ulcers     Data Reviewed: I have personally reviewed following labs and imaging studies  CBC: Recent Labs  Lab 04/23/18 0425 04/28/18 0827  WBC 7.3 6.0  HGB 8.2* 8.5*  HCT 29.6* 30.9*  MCV 98.0 99.0  PLT 160 912   Basic Metabolic Panel: Recent Labs  Lab 04/23/18 0425 04/28/18 0827  NA 141 142  K 4.7 3.2*  CL 105 100  CO2 28 29  GLUCOSE 111* 119*  BUN 32* 34*  CREATININE 0.33* 0.33*  CALCIUM 8.7* 9.4  MG 2.1  --    GFR: Estimated Creatinine Clearance: 55.1 mL/min (A) (by C-G formula based on SCr of 0.33 mg/dL (L)). Liver Function Tests: Recent Labs  Lab 04/23/18 0425 04/28/18 0827  AST 42* 14*  ALT 15 12  ALKPHOS 50 51  BILITOT 1.1 0.4  PROT 5.2* 5.2*  ALBUMIN 2.1* 2.2*   No results for input(s): LIPASE, AMYLASE in the last 168 hours. No results for input(s): AMMONIA in the last 168 hours. Coagulation Profile: No results for input(s): INR, PROTIME in the last 168 hours. Cardiac Enzymes: No results for input(s): CKTOTAL, CKMB, CKMBINDEX,  TROPONINI in the last 168 hours. BNP (last 3 results) No results for input(s): PROBNP in the last 8760 hours. HbA1C: No results for input(s): HGBA1C in the last 72 hours. CBG: Recent Labs  Lab 04/28/18 2028 04/28/18 2330 04/29/18 0329 04/29/18 0759 04/29/18 1203  GLUCAP 86 120* 96 95 108*   Lipid Profile: No results for input(s): CHOL, HDL, LDLCALC, TRIG, CHOLHDL, LDLDIRECT in the last 72 hours. Thyroid Function Tests: No results for input(s): TSH, T4TOTAL, FREET4, T3FREE, THYROIDAB in the last 72 hours. Anemia Panel: No results for input(s): VITAMINB12, FOLATE, FERRITIN, TIBC, IRON, RETICCTPCT in the last 72 hours. Sepsis Labs: No results for input(s): PROCALCITON, LATICACIDVEN in the last 168 hours.  Recent Results (from the past 240 hour(s))  Culture, respiratory (non-expectorated)     Status: None   Collection Time: 04/19/18  3:48 PM  Result Value Ref Range Status   Specimen Description   Final    TRACHEAL ASPIRATE Performed at [facility] [address], [place], [place] [address]    Special Requests   Final    NONE Performed at [facility], [place] [address] [place], [place] [address]    Gram Stain   Final    ABUNDANT WBC PRESENT,BOTH PMN AND MONONUCLEAR ABUNDANT GRAM NEGATIVE RODS Performed at [facility], [place] [address] [place], [place] [address]    Culture FEW PSEUDOMONAS AERUGINOSA  Final   Report Status 04/22/2018 FINAL  Final   Organism ID, Bacteria PSEUDOMONAS AERUGINOSA  Final      Susceptibility   Pseudomonas aeruginosa - MIC*    CEFTAZIDIME 8 SENSITIVE Sensitive     CIPROFLOXACIN >=4 RESISTANT Resistant     GENTAMICIN 8 INTERMEDIATE Intermediate     IMIPENEM >=16 RESISTANT Resistant     PIP/TAZO 16 SENSITIVE Sensitive     CEFEPIME 16 INTERMEDIATE Intermediate     * FEW PSEUDOMONAS AERUGINOSA  MRSA PCR Screening     Status: Abnormal   Collection Time: 04/20/18  6:08 PM  Result Value Ref Range Status     MRSA by PCR POSITIVE (A) NEGATIVE Final    Comment:        The GeneXpert MRSA Assay (FDA approved for NASAL specimens only), is one component of a comprehensive MRSA colonization surveillance program. It is not intended to diagnose MRSA infection nor to guide or monitor treatment for MRSA infections. RESULT CALLED TO, READ BACK BY AND VERIFIED WITH: [email or web address] AT 2101 ON 04/20/18 BY N.[email or web address] Performed at [facility], [place] [address], [place], [place] [address]   Culture, respiratory (non-expectorated)     Status: None   Collection Time: 04/21/18  3:38 PM  Result Value Ref Range Status   Specimen Description   Final    TRACHEAL ASPIRATE Performed at [facility] [address] [place], [place] [address]    Special Requests   Final    Immunocompromised Performed at [facility], [place] [address] [place], [place] [address]    Gram Stain   Final    NO WBC SEEN FEW GRAM VARIABLE ROD Performed at [facility],  [address] [place], [place] [address]    Culture ABUNDANT PSEUDOMONAS AERUGINOSA  Final   Report Status 04/24/2018 FINAL  Final   Organism ID, Bacteria PSEUDOMONAS AERUGINOSA  Final      Susceptibility   Pseudomonas aeruginosa - MIC*    CEFTAZIDIME 4 SENSITIVE Sensitive     CIPROFLOXACIN 2 INTERMEDIATE Intermediate     GENTAMICIN 8 INTERMEDIATE Intermediate     IMIPENEM >=16 RESISTANT Resistant     PIP/TAZO 8 SENSITIVE Sensitive     CEFEPIME 16 INTERMEDIATE Intermediate     * ABUNDANT PSEUDOMONAS AERUGINOSA  C difficile quick scan w PCR reflex     Status: Abnormal   Collection Time: 04/24/18 10:10 PM  Result Value Ref Range Status   C Diff antigen POSITIVE (A) NEGATIVE Final   C Diff toxin NEGATIVE NEGATIVE Final   C Diff interpretation Results are indeterminate. See PCR results.  Final    Comment: Performed at [facility], [place] [address], [place], [place] [address]  C.  Diff by PCR, Reflexed     Status: Abnormal   Collection Time: 04/24/18 10:10 PM  Result Value Ref Range Status   Toxigenic C. Difficile by PCR POSITIVE (A) NEGATIVE Final    Comment: Positive for toxigenic C. difficile with little to no toxin production. Only treat if clinical presentation suggests symptomatic illness. Performed at [facility], [place] [address] [place],  [address]          Radiology Studies: No results found.      Scheduled Meds: . apixaban  2.5 mg Per Tube BID  . aspirin  81 mg Oral Daily  . carvedilol  6.25 mg Oral BID WC  . chlorhexidine gluconate (MEDLINE KIT)  15 mL Mouth Rinse BID  . Chlorhexidine Gluconate Cloth  6 each Topical Daily  . collagenase   Topical Daily  . famotidine  20 mg Oral QHS  . feeding supplement (PRO-STAT SUGAR FREE 64)  30 mL Per Tube BID  . feeding supplement (VITAL HIGH PROTEIN)  1,000 mL Per Tube Q24H  . free water  200 mL Per Tube Q8H  . furosemide  40 mg Oral Daily  . levETIRAcetam  1,000 mg Oral BID  . mouth rinse  15 mL Mouth Rinse 10 times per day  . multivitamin  15 mL Per Tube Daily  . nutrition supplement (JUVEN)  1 packet Per Tube BID BM  . pantoprazole sodium  40 mg Per Tube Daily  . sodium chloride flush  10-40 mL Intracatheter Q12H  . sodium chloride flush  3 mL Intravenous Q12H  . valproic acid  500 mg Oral BID  . vancomycin  125 mg Oral QID   Continuous Infusions: . potassium chloride Stopped (04/29/18 0332)     LOS: 11 days     Georgette Shell, MD [facility]  If 7PM-7AM, please contact night-coverage [email or web address] Password [facility] 04/29/2018, 2:32 PM

## 2018-04-30 LAB — GLUCOSE, CAPILLARY
Glucose-Capillary: 94 mg/dL (ref 70–99)
Glucose-Capillary: 85 mg/dL (ref 70–99)

## 2018-04-30 NOTE — Progress Notes (Signed)
PROGRESS NOTE    Melissa Cochran  ZOX:[id number] DOB: [birth year] DOA: 04/18/2018 PCP: [name], MD   Brief Narrative85 year old with past medical history relevant for chronic atrial fibrillation on apixaban, hypertension, coronary artery disease status post stent in 8119, chronic systolic heart failure with EF of 45 to 50% by echo on 06/28/2017, nephrolithiasis complicated by hydronephrosis status post stent placement in 2017, history of bilateral hydronephrosis, CVA, seizure disorder complicated by chronic respiratory failure with ventilator dependence and PEG tube placement who was admitted from [facility] for lethargy and electrolyte imbalances, found to have C diff   Assessment & Plan:   Principal Problem:   Acute encephalopathy Active Problems:   Essential (primary) hypertension   History of cardiac catheterization   Hyperlipidemia   DVT (deep venous thrombosis) (HCC)   Venous ulcer of ankle (HCC)   Seizures (HCC)   A-fib (HCC)   CVA (cerebral vascular accident) ([place])   Chronic respiratory failure ([place])   Acute respiratory failure with hypoxia ([place])  Acute metabolic encephalopathy: her baseline is unclear though reportedly she was able to speak using a Passy-Muir valveat [facility] and was working on swallowing.Her encephalopathy has almost completely resolved. At this time it seems to be likely secondary to decreasing levels are antiepileptics due to meropenem given at [facility] for possible hospital-acquired pneumonia as well as electrolyte abnormalities including hyERonatremia. -EEG on 04/19/2018 shows occasional sharp activity over right frontotemporal region which neurology is attributing to a meningioma. B12 and folate replete, RPR negative. Continue antiepileptics per below. Brain MRI shows no acute process but does show old stroke andmeningioma.  Awaiting placement   Possible recent pneumonia/bacterial tracheitis: Patient reportedly was treated for  pneumonia at [facility] with meropenem and vancomycin. There is no culture results from this that of return. Of note there was a concern that meropenem could decrease the valproic acid level and cause patient to have a seizure. At this time the relevance of this respiratory culture from 04/19/2018 growing Pseudomonas is unclear particularly the patient does not have any other infectious symptoms and a procalcitonin is quite low. blood cultures on 04/18/2018 growing 1 out of 2 coagulase-negative staph. Respiratory culture from 04/19/2018 growing Pseudomonas. Repeat respiratory culture from 04/21/2018 no growth to date. Procalcitonin was undetectable on admission  C diff colitis.Likely due to recent antibiotic use at [facility]. StartedPO vanc. 12/16>>. Monitor   Seizure disorder:Restartlevetiracetam thousand milligrams twice daily by tube. Restartedvalproic acid 250 mg twice daily by tube  Torticollis. Contphysical therapy  Chronic respiratory failure status post tracheostomy: PCCM following, appreciate t recommendations  Hypernatremiawith PEG tube dependence: Resolved with free water flushes and D5W. Continue free water flushes 200 mL's every 8 hours. Vital 1.5 @ 40 mL/hr with 30 mL Prostat BID  Chronic atrial fibrillation complicated by CVA:. Continue beta-blocker. Continue apixaban 2.5 mg twice daily  Hypertension/hyperlipidemia/coronary artery disease status post stent: Continue aspirin 81 mg. continue carvedilol 6.25 mg twice daily  Anemia: This appears to be stable at this time. Suspect most likely iron deficiency anemia and anemia of chronic disease. RN Pressure Injury Documentation: Pressure Injury 04/18/18 Stage IV - Full thickness tissue loss with exposed bone, tendon or muscle. (Active)  04/18/18 1814  Location: Vertebral column  Location Orientation:   Staging: Stage IV - Full thickness tissue loss with exposed bone, tendon or muscle.  Wound Description (Comments):     Present on Admission: Yes     Pressure Injury 04/18/18 Unstageable - Full thickness tissue loss in which the base of  the ulcer is covered by slough (yellow, tan, gray, green or brown) and/or eschar (tan, brown or black) in the wound bed. Has tunneling at 36 o clock, yellow slough aro (Active)  04/18/18 1815  Location: Sacrum  Location Orientation:   Staging: Unstageable - Full thickness tissue loss in which the base of the ulcer is covered by slough (yellow, tan, gray, green or brown) and/or eschar (tan, brown or black) in the wound bed.  Wound Description (Comments): Has tunneling at 78 o clock, yellow slough around edges of wound bed  Present on Admission: Yes    Malnutrition Type:  Nutrition Problem: Increased nutrient needs Etiology: wound healing   Malnutrition Characteristics:  Signs/Symptoms: estimated needs   Nutrition Interventions:  Interventions: Tube feeding, Prostat, MVI, Juven  Estimated body mass index is 27.81 kg/m as calculated from the following:   Height as of this encounter: '5\' 4"'$  (1.626 m).   Weight as of this encounter: 73.5 kg.  DVT prophylaxis: eliquis Code Status:full Family Communication none Disposition Plan: Pending placement  Consultants:  pccm neuro   Procedures: 04/19/2018 WGN:FAOZ is an abnormal electroencephalogram due tooccasionalsharp activity over the rightfrontotemporal region. This issuggestive ofa focal disturbance over right hemisphere with epileptogenic potential. 04/22/2018 EEG:  Antimicrobials: none  Subjective: Resting in nad  Objective: Vitals:   04/30/18 0800 04/30/18 0844 04/30/18 1000 04/30/18 1137  BP: 118/62  132/65   Pulse: 77  74   Resp: 16  16   Temp: 98.4 F (36.9 C)     TempSrc: Oral     SpO2: 100% 100% 100% 97%  Weight:      Height:        Intake/Output Summary (Last 24 hours) at 04/30/2018 1234 Last data filed at 04/30/2018 1158 Gross per 24 hour  Intake 1390 ml  Output 2125 ml  Net -735  ml   Filed Weights   04/27/18 0451 04/28/18 0400 04/29/18 0455  Weight: 71.6 kg 72.3 kg 73.5 kg    Examination:  General exam: Appears calm and comfortable  Respiratory system: Clear to auscultation. Respiratory effort normal. Cardiovascular system: S1 & S2 heard, RRR. No JVD, murmurs, rubs, gallops or clicks. No pedal edema. Gastrointestinal system: Abdomen is nondistended, soft and nontender. No organomegaly or masses felt. Normal bowel sounds heard. Central nervous system: Alert and oriented. No focal neurological deficits. Extremities: Symmetric 5 x 5 power. Skin: No rashes, lesions or ulcers  Data Reviewed: I have personally reviewed following labs and imaging studies  CBC: Recent Labs  Lab 04/28/18 0827  WBC 6.0  HGB 8.5*  HCT 30.9*  MCV 99.0  PLT 308   Basic Metabolic Panel: Recent Labs  Lab 04/28/18 0827  NA 142  K 3.2*  CL 100  CO2 29  GLUCOSE 119*  BUN 34*  CREATININE 0.33*  CALCIUM 9.4   GFR: Estimated Creatinine Clearance: 55.1 mL/min (A) (by C-G formula based on SCr of 0.33 mg/dL (L)). Liver Function Tests: Recent Labs  Lab 04/28/18 0827  AST 14*  ALT 12  ALKPHOS 51  BILITOT 0.4  PROT 5.2*  ALBUMIN 2.2*   No results for input(s): LIPASE, AMYLASE in the last 168 hours. No results for input(s): AMMONIA in the last 168 hours. Coagulation Profile: No results for input(s): INR, PROTIME in the last 168 hours. Cardiac Enzymes: No results for input(s): CKTOTAL, CKMB, CKMBINDEX, TROPONINI in the last 168 hours. BNP (last 3 results) No results for input(s): PROBNP in the last 8760 hours. HbA1C: No results for input(s):  HGBA1C in the last 72 hours. CBG: Recent Labs  Lab 04/29/18 1616 04/29/18 2020 04/29/18 2350 04/30/18 0405 04/30/18 0802  GLUCAP 111* 97 97 94 85   Lipid Profile: No results for input(s): CHOL, HDL, LDLCALC, TRIG, CHOLHDL, LDLDIRECT in the last 72 hours. Thyroid Function Tests: No results for input(s): TSH, T4TOTAL,  FREET4, T3FREE, THYROIDAB in the last 72 hours. Anemia Panel: No results for input(s): VITAMINB12, FOLATE, FERRITIN, TIBC, IRON, RETICCTPCT in the last 72 hours. Sepsis Labs: No results for input(s): PROCALCITON, LATICACIDVEN in the last 168 hours.  Recent Results (from the past 240 hour(s))  MRSA PCR Screening     Status: Abnormal   Collection Time: 04/20/18  6:08 PM  Result Value Ref Range Status   MRSA by PCR POSITIVE (A) NEGATIVE Final    Comment:        The GeneXpert MRSA Assay (FDA approved for NASAL specimens only), is one component of a comprehensive MRSA colonization surveillance program. It is not intended to diagnose MRSA infection nor to guide or monitor treatment for MRSA infections. RESULT CALLED TO, READ BACK BY AND VERIFIED WITH: B.BLACKBURN AT 2101 ON 04/20/18 BY N.THOMPSON Performed at [facility], [place] [address] [place], [place] [address]   Culture, respiratory (non-expectorated)     Status: None   Collection Time: 04/21/18  3:38 PM  Result Value Ref Range Status   Specimen Description   Final    TRACHEAL ASPIRATE Performed at [place] [address], [place], [place] [address]    Special Requests   Final    Immunocompromised Performed at [facility], [place] [address], [place], [place] [address]    Gram Stain   Final    NO WBC SEEN FEW GRAM VARIABLE ROD Performed at [facility], [place] [address], [place], [place] [address]    Culture ABUNDANT PSEUDOMONAS AERUGINOSA  Final   Report Status 04/24/2018 FINAL  Final   Organism ID, Bacteria PSEUDOMONAS AERUGINOSA  Final      Susceptibility   Pseudomonas aeruginosa - MIC*    CEFTAZIDIME 4 SENSITIVE Sensitive     CIPROFLOXACIN 2 INTERMEDIATE Intermediate     GENTAMICIN 8 INTERMEDIATE Intermediate     IMIPENEM >=16 RESISTANT Resistant     PIP/TAZO 8 SENSITIVE Sensitive     CEFEPIME 16 INTERMEDIATE Intermediate     * ABUNDANT PSEUDOMONAS  AERUGINOSA  C difficile quick scan w PCR reflex     Status: Abnormal   Collection Time: 04/24/18 10:10 PM  Result Value Ref Range Status   C Diff antigen POSITIVE (A) NEGATIVE Final   C Diff toxin NEGATIVE NEGATIVE Final   C Diff interpretation Results are indeterminate. See PCR results.  Final    Comment: Performed at [facility], [place] [address], [place], [place] [address]  C. Diff by PCR, Reflexed     Status: Abnormal   Collection Time: 04/24/18 10:10 PM  Result Value Ref Range Status   Toxigenic C. Difficile by PCR POSITIVE (A) NEGATIVE Final    Comment: Positive for toxigenic C. difficile with little to no toxin production. Only treat if clinical presentation suggests symptomatic illness. Performed at [facility], [place] [address] [place], [place] [address]          Radiology Studies: No results found.      Scheduled Meds: . apixaban  2.5 mg Per Tube BID  . aspirin  81 mg Oral Daily  . carvedilol  6.25 mg Oral BID WC  . chlorhexidine gluconate (MEDLINE KIT)  15 mL Mouth Rinse BID  . Chlorhexidine Gluconate Cloth  6 each Topical Daily  . collagenase   Topical Daily  . famotidine  20 mg Oral QHS  . feeding supplement (PRO-STAT SUGAR FREE 64)  30 mL Per Tube BID  . feeding supplement (VITAL HIGH PROTEIN)  1,000 mL Per Tube Q24H  . free water  200 mL Per Tube Q8H  . furosemide  40 mg Oral Daily  . levETIRAcetam  1,000 mg Oral BID  . mouth rinse  15 mL Mouth Rinse 10 times per day  . multivitamin  15 mL Per Tube Daily  . nutrition supplement (JUVEN)  1 packet Per Tube BID BM  . pantoprazole sodium  40 mg Per Tube Daily  . sodium chloride flush  10-40 mL Intracatheter Q12H  . sodium chloride flush  3 mL Intravenous Q12H  . valproic acid  500 mg Oral BID  . vancomycin  125 mg Oral QID   Continuous Infusions:   LOS: 12 days     Georgette Shell, MD [facility]  If 7PM-7AM, please contact night-coverage [email or web address] Password  [facility] 04/30/2018, 12:34 PM

## 2018-05-01 DIAGNOSIS — Z930 Tracheostomy status: Secondary | ICD-10-CM

## 2018-05-01 DIAGNOSIS — R0902 Hypoxemia: Secondary | ICD-10-CM

## 2018-05-01 LAB — GLUCOSE, CAPILLARY
GLUCOSE-CAPILLARY: 98 mg/dL (ref 70–99)
Glucose-Capillary: 111 mg/dL — ABNORMAL HIGH (ref 70–99)
Glucose-Capillary: 101 mg/dL — ABNORMAL HIGH (ref 70–99)
Glucose-Capillary: 100 mg/dL — ABNORMAL HIGH (ref 70–99)
Glucose-Capillary: 101 mg/dL — ABNORMAL HIGH (ref 70–99)
Glucose-Capillary: 94 mg/dL (ref 70–99)
Glucose-Capillary: 96 mg/dL (ref 70–99)
Glucose-Capillary: 149 mg/dL — ABNORMAL HIGH (ref 70–99)
Glucose-Capillary: 100 mg/dL — ABNORMAL HIGH (ref 70–99)
Glucose-Capillary: 88 mg/dL (ref 70–99)
Glucose-Capillary: 87 mg/dL (ref 70–99)

## 2018-05-01 MED ORDER — JUVEN PO PACK: 1.0000 | PACK | Freq: Two times a day (BID) | ORAL | 0 refills | Status: DC

## 2018-05-01 MED ORDER — PRO-STAT SUGAR FREE PO LIQD: 30.0000 mL | Freq: Two times a day (BID) | ORAL | 0 refills | Status: DC

## 2018-05-01 MED ORDER — VANCOMYCIN 50 MG/ML ORAL SOLUTION: 125.0000 mg | Freq: Four times a day (QID) | ORAL | 4 refills | Status: DC

## 2018-05-01 MED ORDER — VITAL HIGH PROTEIN PO LIQD: 1000.0000 mL | ORAL | Status: DC

## 2018-05-01 MED ORDER — ADULT MULTIVITAMIN LIQUID CH: 15.0000 mL | Freq: Every day | ORAL | Status: DC

## 2018-05-01 NOTE — Progress Notes (Signed)
LCSW left message for facility to follow up on bed availability.   LCSW will continue to follow for disposition.    Carolin Coy [place]

## 2018-05-01 NOTE — Progress Notes (Signed)
NAME:  Melissa Cochran, MRN:  [id number], DOB:  10/31/1935, LOS: 67 ADMISSION DATE:  04/18/2018, CONSULTATION DATE:  04/18/18 REFERRING MD:  Florene Glen, CHIEF COMPLAINT:  Acute encephalopathy   Brief History   82 y/o female who has been vent dependent since April 2019 was admitted to [facility] for acute change in mental status on 04/18/18. Came from [facility].   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  This is suggestive of a focal disturbance over the right hemisphere with epileptogenic potential.   Micro Data:  12/10 blood >> 1/2 coag neg staph  12/10 urine >> negative  12/11 tracheal aspirate >> few pseudomonas aeruginosa >> S-ceftaz, zosyn  Antimicrobials:  Per primary   Interim history/subjective:   No events overnight, no new complaints, failing to wean  Objective   Blood pressure 111/68, pulse 70, temperature 98.2 F (36.8 C), temperature source Oral, resp. rate 12, height 5\' 4"  (1.626 m), weight 72.8 kg, SpO2 99 %.    Vent Mode: PRVC FiO2 (%):  [28 %] 28 % Set Rate:  [12 bmp] 12 bmp Vt Set:  [430 mL] 430 mL PEEP:  [5 cmH20-8 cmH20] 5 cmH20 Plateau Pressure:  [18 cmH20-20 cmH20] 18 cmH20   Intake/Output Summary (Last 24 hours) at 05/01/2018 0902 Last data filed at 05/01/2018 0744 Gross per 24 hour  Intake 1683 ml  Output 2075 ml  Net -392 ml   Filed Weights   04/28/18 0400 04/29/18 0455 05/01/18 0416  Weight: 72.3 kg 73.5 kg 72.8 kg    Examination: General: Frail, chronically ill appearing female, unresponsive HEENT:  [place]/AT, pupils sluggish, EOM spontaneous and MMM Neuro: Unresponsive this AM, not following any commands CV: RRR, Nl S1/S2 and -M/R/G PULM: [place]/AT, PERRL, EOM-I and MMM  GI: Soft, NT, ND and +BS Extremities: -edema and -tenderness Skin: ulcers  I reviewed CXR myself, trach is in a good position  Resolved Hospital Problem list   Discussed with PCCM-NP  Assessment & Plan:   Chronic respiratory failure with hypoxemia: P: Maintain on full vent support  Hypoxemia: P: Titrate O2 for sat of 88-92%  Tracheostomy status: P: Maintain current trach size and type, no downsize No decannulation  Acute encephalopathy -multifactorial, no one clear underlying cause P: Will defer to neuro and primary  Seizure Disorder P: Per Neurology, [facility]   Hypernatremia P: Per [facility] Free water via tube  Follow BMP    Best practice:  Diet: per [facility] Pain/Anxiety/Delirium protocol (if indicated): N/A VAP protocol (if indicated): yes DVT prophylaxis: add sub cutaneous heparin GI prophylaxis: n/a Disposition:  She has run out of her medicare days, can not go back to [facility].  Will need LTAC or vent SNF due to vent needs.  PCCM will continue to follow for trach / vent needs.   Labs   CBC: Recent Labs  Lab 04/28/18 0827  WBC 6.0  HGB 8.5*  HCT 30.9*  MCV 99.0  PLT 295    Basic Metabolic Panel: Recent Labs  Lab 04/28/18 0827  NA 142  K 3.2*  CL 100  CO2 29  GLUCOSE 119*  BUN 34*  CREATININE  0.33*  CALCIUM 9.4   GFR: Estimated Creatinine Clearance: 54.8 mL/min (A) (by C-G formula based on SCr of 0.33 mg/dL (L)). Recent Labs  Lab 04/28/18 0827  WBC 6.0    Liver Function Tests: Recent Labs  Lab 04/28/18 0827  AST 14*  ALT 12  ALKPHOS 51  BILITOT 0.4  PROT 5.2*  ALBUMIN 2.2*   No results for input(s): LIPASE, AMYLASE in the last 168 hours. No results for input(s): AMMONIA in the last 168 hours.  ABG    Component Value Date/Time   PHART 7.500 (H) 04/18/2018 1221    PCO2ART 37.3 04/18/2018 1221   PO2ART 78.3 (L) 04/18/2018 1221   HCO3 28.9 (H) 04/18/2018 1221   TCO2 27 06/26/2017 0210   ACIDBASEDEF 2.0 06/26/2017 0210   O2SAT 96.6 04/18/2018 1221     Coagulation Profile: No results for input(s): INR, PROTIME in the last 168 hours.  Cardiac Enzymes: No results for input(s): CKTOTAL, CKMB, CKMBINDEX, TROPONINI in the last 168 hours.  HbA1C: Hgb A1c MFr Bld  Date/Time Value Ref Range Status  02/13/2017 09:35 PM 5.1 4.8 - 5.6 % Final    Comment:    (NOTE) Pre diabetes:          5.7%-6.4% Diabetes:              >6.4% Glycemic control for   <7.0% adults with diabetes     CBG: Recent Labs  Lab 04/30/18 1634 04/30/18 2001 04/30/18 2318 05/01/18 0349 05/01/18 0759  GLUCAP 101* 100* 101* 94 96     Rush Farmer, M.D. [facility]. Pager: [phone]. After hours pager: [phone].

## 2018-05-01 NOTE — Progress Notes (Signed)
Nutrition Follow-up  DOCUMENTATION CODES:   Not applicable  INTERVENTION:   -Continue Vital High Protein @ 40 ml/hr (960 ml) via PEG -30 ml Prostat BID -Free water 200 ml Q8 hours   Provides: 1350 kcals, 114 grams protein, 802 ml free water (1402 ml with flushes)  Continue Juven Fruit Punch BID, each serving provides 95kcal and 2.5g of protein (amino acids glutamine and arginine).   NUTRITION DIAGNOSIS:   Increased nutrient needs related to wound healing as evidenced by estimated needs.  Ongoing  GOAL:   Patient will meet greater than or equal to 90% of their needs  Met with TF  MONITOR:   Vent status, TF tolerance, Weight trends, Labs, Skin  REASON FOR ASSESSMENT:   Ventilator, Consult Enteral/tube feeding initiation and management  ASSESSMENT:   82 year old with past medical history relevant for atrial fibrillation, HTN, CAD s/p stent in 2011, CHF, nephrolithiasis complicated by hydronephrosis s/p stent placement in 2017, CVA, seizure disorder complicated by chronic respiratory failure with ventilator dependence and PEG tube placement who was admitted from [facility] for lethargy and electrolyte imbalances.   Pt discussed with RN. Tolerating VHP @ 40 ml/hr + Prostat BID well. Not taking anything by mouth. Failing to wean from vent. Palliative spoke with pt's daughter 12/18, she wishes to continue aggressive care.    Nutrition needs remain the same. Utilizing EDW of 63.5 kg. Weight noted to increase from 154 lb on 12/16 to 161 lb today. Pt continues to diurese.   Patient is remains on ventilator support via trach  MV: 5.1 L/min Temp (24hrs), Avg:98.4 F (36.9 C), Min:98.2 F (36.8 C), Max:98.7 F (37.1 C) BP: 107/71 MAP: 80  I/O: +1.6 L since admit UOP: 1175 ml x 24 hrs  Rectal tube: 900 ml x 24 hrs   Medications reviewed and include: 40 mg lasix once daily, liquid MVI Labs reviewed: CBG 85-111  Diet Order:   Diet Order            Diet - low sodium  heart healthy        Diet NPO time specified  Diet effective now              EDUCATION NEEDS:   Not appropriate for education at this time  Skin:  Skin Assessment: Skin Integrity Issues: Skin Integrity Issues:: Stage IV, Unstageable Stage IV: vertebral column Unstageable: full thickness to sacrum  Last BM:  05/01/18  Height:   Ht Readings from Last 1 Encounters:  04/26/18 '5\' 4"'$  (1.626 m)    Weight:   Wt Readings from Last 1 Encounters:  05/01/18 72.8 kg    Ideal Body Weight:  54.54 kg  BMI:  Body mass index is 27.55 kg/m.  Estimated Nutritional Needs:   Kcal:  1263 kcal  Protein:  108-127 grams (1.7-2 grams/kg)  Fluid:  >/= 2 L/day   Mariana Single RD, LDN Clinical Nutrition Pager # - [phone]

## 2018-05-02 LAB — GLUCOSE, CAPILLARY
Glucose-Capillary: 88 mg/dL (ref 70–99)
Glucose-Capillary: 95 mg/dL (ref 70–99)
Glucose-Capillary: 93 mg/dL (ref 70–99)

## 2018-05-02 NOTE — NC FL2 (Signed)
[place] LEVEL OF CARE SCREENING TOOL     IDENTIFICATION  Patient Name: Melissa Cochran Birthdate: [birth year] Sex: female Admission Date (Current Location): 04/18/2018  [facility] and [place] Number:  Herbalist and Address:  [facility],  [address], [place]      Provider Number: [id number]  Attending Physician Name and Address:  Georgette Shell, MD  Relative Name and Phone Number:       Current Level of Care: Hospital Recommended Level of Care: [place] Prior Approval Number:    Date Approved/Denied:   PASRR Number:    Discharge Plan: SNF    Current Diagnoses: Patient Active Problem List   Diagnosis Date Noted  . Acute respiratory failure with hypoxia ([place])   . CVA (cerebral vascular accident) ([place]) 04/19/2018  . Chronic respiratory failure ([place]) 04/19/2018  . Acute encephalopathy 04/18/2018  . E-coli UTI, ESBL  06/30/2017  . A-fib ([place]) 06/30/2017  . Need for protective airway ventilation 06/30/2017  . Seizures ([place]) 06/26/2017  . Acute renal failure ([place])   . Elevated troponin I level   . Acute renal failure with tubular necrosis ([place])   . Palliative care encounter   . Pressure injury of skin 02/17/2017  . Bilateral lower extremity edema 10/08/2016  . Venous ulcer of ankle ([place]) 07/08/2016  . DVT (deep venous thrombosis) ([place]) 07/02/2016  . Swelling of limb 06/08/2016  . Pain in limb 06/08/2016  . Altered mental status 01/22/2016  . S/P coronary artery stent placement 10/30/2015  . Preop cardiovascular exam 10/08/2015  . SOB (shortness of breath) on exertion 10/08/2015  . Malignant neoplasm of trigone of bladder ([place]) 09/29/2015  . Mass of lower lobe of left lung, incidetnal on CT 09/2015, smoker.  09/29/2015  . Other disorders of lung 09/29/2015  . Other microscopic hematuria 08/26/2015  . Kidney stone 08/26/2015  . Urge incontinence 08/26/2015  . Pneumonia 01/24/2015  . PNA  (pneumonia) 01/24/2015  . Essential (primary) hypertension 12/09/2014  . Adnexal mass 11/28/2013  . History of cardiac catheterization 11/28/2013  . Hyperlipidemia 11/28/2013  . Other specified postprocedural states 11/28/2013    Orientation RESPIRATION BLADDER Height & Weight     Self  Tracheostomy, Vent(Vent Mode: PRVC FiO2 (%): [28 %] 28 % Set Rate: [12 bmp] 12 bmp Vt Set: [430 mL] 430 mL PEEP: [5 cmH20-8 cmH20] 5 cmH20 Plateau Pressure: [18 cmH20-20 cmH20] 18 cmH20 Incontinent, Indwelling catheter Weight: 157 lb 3 oz (71.3 kg) Height:  '5\' 4"'$  (162.6 cm)  BEHAVIORAL SYMPTOMS/MOOD NEUROLOGICAL BOWEL NUTRITION STATUS      Incontinent Feeding tube  AMBULATORY STATUS COMMUNICATION OF NEEDS Skin   Total Care Verbally PU Stage and Appropriate Care(unstageable pressure injury sacrum, stage IV pressure injury vertebral column)                       Personal Care Assistance Level of Assistance  Bathing, Feeding, Dressing Bathing Assistance: Maximum assistance Feeding assistance: Maximum assistance(feeding tube) Dressing Assistance: Maximum assistance     Functional Limitations Info  Sight, Hearing, Speech Sight Info: Adequate Hearing Info: Adequate Speech Info: Adequate    SPECIAL CARE FACTORS FREQUENCY                       Contractures Contractures Info: Not present    Additional Factors Info  Code Status, Allergies Code Status Info: full code Allergies Info: nka  Current Medications (05/02/2018):  This is the current hospital active medication list Current Facility-Administered Medications  Medication Dose Route Frequency Provider Last Rate Last Dose  . acetaminophen (TYLENOL) solution 650 mg  650 mg Oral Q6H PRN Kinnie Feil, MD   650 mg at 04/26/18 1258  . apixaban (ELIQUIS) tablet 2.5 mg  2.5 mg Per Tube BID Purohit, Shrey C, MD   2.5 mg at 05/02/18 0942  . aspirin chewable tablet 81 mg  81 mg Oral Daily Purohit, Shrey C, MD   81  mg at 05/02/18 0942  . carvedilol (COREG) tablet 6.25 mg  6.25 mg Oral BID WC Purohit, Shrey C, MD   6.25 mg at 05/02/18 0842  . chlorhexidine gluconate (MEDLINE KIT) (PERIDEX) 0.12 % solution 15 mL  15 mL Mouth Rinse BID Purohit, Shrey C, MD   15 mL at 05/02/18 0843  . Chlorhexidine Gluconate Cloth 2 % PADS 6 each  6 each Topical Daily Purohit, Konrad Dolores, MD   6 each at 05/02/18 0946  . collagenase (SANTYL) ointment   Topical Daily Purohit, Shrey C, MD      . famotidine (PEPCID) tablet 20 mg  20 mg Oral QHS Elodia Florence., MD   20 mg at 05/01/18 2141  . feeding supplement (PRO-STAT SUGAR FREE 64) liquid 30 mL  30 mL Per Tube BID Kinnie Feil, MD   30 mL at 05/02/18 0942  . feeding supplement (VITAL HIGH PROTEIN) liquid 1,000 mL  1,000 mL Per Tube Q24H Kinnie Feil, MD   1,000 mL at 05/02/18 1129  . fentaNYL (SUBLIMAZE) injection 12.5-25 mcg  12.5-25 mcg Intravenous Q2H PRN Ollis, Brandi L, NP   12.5 mcg at 05/02/18 1130  . free water 200 mL  200 mL Per Tube Q8H Ollis, Brandi L, NP   200 mL at 05/02/18 0516  . furosemide (LASIX) tablet 40 mg  40 mg Oral Daily Purohit, Shrey C, MD   40 mg at 05/02/18 0942  . levETIRAcetam (KEPPRA) 100 MG/ML solution 1,000 mg  1,000 mg Oral BID Purohit, Shrey C, MD   1,000 mg at 05/02/18 0943  . MEDLINE mouth rinse  15 mL Mouth Rinse 10 times per day Cristy Folks, MD   15 mL at 05/02/18 1132  . multivitamin liquid 15 mL  15 mL Per Tube Daily Kinnie Feil, MD   15 mL at 05/02/18 0942  . nutrition supplement (JUVEN) (JUVEN) powder packet 1 packet  1 packet Per Tube BID BM Kinnie Feil, MD   1 packet at 05/02/18 [phone]  . pantoprazole sodium (PROTONIX) 40 mg/20 mL oral suspension 40 mg  40 mg Per Tube Daily Ollis, Brandi L, NP   40 mg at 05/02/18 0942  . sodium chloride flush (NS) 0.9 % injection 10-40 mL  10-40 mL Intracatheter Q12H Purohit, Shrey C, MD   10 mL at 05/02/18 0945  . sodium chloride flush (NS) 0.9 % injection 10-40 mL  10-40 mL  Intracatheter PRN Purohit, Konrad Dolores, MD   10 mL at 04/30/18 0953  . sodium chloride flush (NS) 0.9 % injection 3 mL  3 mL Intravenous Q12H Elodia Florence., MD   3 mL at 05/02/18 0945  . valproic acid (DEPAKENE) solution 500 mg  500 mg Oral BID Purohit, Shrey C, MD   500 mg at 05/02/18 0942  . vancomycin (VANCOCIN) 50 mg/mL oral solution 125 mg  125 mg Oral QID Kinnie Feil, MD  125 mg at 05/02/18 0254     Discharge Medications: TAKE these medications   acetaminophen 325 MG tablet Commonly known as:  TYLENOL Take 2 tablets (650 mg total) by mouth every 6 (six) hours as needed for mild pain (or Fever >/= 101).   apixaban 2.5 MG Tabs tablet Commonly known as:  ELIQUIS Place 2.5 mg into feeding tube 2 (two) times daily.   aspirin 81 MG chewable tablet Chew 81 mg by mouth daily.   carvedilol 6.25 MG tablet Commonly known as:  COREG Take 1 tablet (6.25 mg total) by mouth 2 (two) times daily with a meal.   chlorhexidine 0.12 % solution Commonly known as:  PERIDEX Use as directed 15 mLs in the mouth or throat 2 (two) times daily.   diazepam 5 MG tablet Commonly known as:  VALIUM Take 2.5 mg by mouth every 12 (twelve) hours.   famotidine 20 MG tablet Commonly known as:  PEPCID Take 20 mg by mouth at bedtime.   feeding supplement (PRO-STAT SUGAR FREE 64) Liqd Place 30 mLs into feeding tube 2 (two) times daily.   furosemide 40 MG tablet Commonly known as:  LASIX Take 1 tablet (40 mg total) by mouth daily.   ipratropium-albuterol 0.5-2.5 (3) MG/3ML Soln Commonly known as:  DUONEB Take 3 mLs by nebulization every 6 (six) hours as needed (SOB).   ISOSOURCE 1.5 CAL Liqd 230 mLs by Gastric Tube route every 4 (four) hours. What changed:  Another medication with the same name was added. Make sure you understand how and when to take each.   feeding supplement (VITAL HIGH PROTEIN) Liqd liquid Place 1,000 mLs into feeding tube daily. What changed:  You were  already taking a medication with the same name, and this prescription was added. Make sure you understand how and when to take each.   nutrition supplement (JUVEN) Pack Place 1 packet into feeding tube 2 (two) times daily between meals. What changed:  You were already taking a medication with the same name, and this prescription was added. Make sure you understand how and when to take each.   levETIRAcetam 100 MG/ML solution Commonly known as:  KEPPRA Take 10 mLs (1,000 mg total) by mouth 2 (two) times daily.   multivitamin Liqd Place 15 mLs into feeding tube daily.   Valproate Sodium 250 MG/5ML Soln solution Commonly known as:  DEPAKENE Take 10 mLs (500 mg total) by mouth 2 (two) times daily. What changed:  how much to take   vancomycin 50 mg/mL  oral solution Commonly known as:  VANCOCIN Take 2.5 mLs (125 mg total) by mouth 4 (four) times daily. Continue till 05/05/2018.     Relevant Imaging Results:  Relevant Lab Results:   Additional Information SS# [id number]  Nila Nephew, LCSW

## 2018-05-02 NOTE — Progress Notes (Signed)
PROGRESS NOTE    Melissa Cochran  UXL:[id number] DOB: 12/21/1935 DOA: 04/18/2018 PCP: [name], MD    Brief:Narrative61 year old with past medical history relevant for chronic atrial fibrillation on apixaban, hypertension, coronary artery disease status post stent in 5366, chronic systolic heart failure with EF of 45 to 50% by echo on 06/28/2017, nephrolithiasis complicated by hydronephrosis status post stent placement in 2017, history of bilateral hydronephrosis, CVA, seizure disorder complicated by chronic respiratory failure with ventilator dependence and PEG tube placement who was admitted from [facility] for lethargy and electrolyte imbalances, found to have C diff   Assessment & Plan:   Principal Problem:   Acute encephalopathy Active Problems:   Essential (primary) hypertension   History of cardiac catheterization   Hyperlipidemia   DVT (deep venous thrombosis) (HCC)   Venous ulcer of ankle (HCC)   Seizures (HCC)   A-fib (HCC)   CVA (cerebral vascular accident) ([place])   Chronic respiratory failure ([place])   Acute respiratory failure with hypoxia ([place]) Acute metabolic encephalopathy: her baseline is unclear though reportedly she was able to speak using a Passy-Muir valveat [facility] and was working on swallowing.Her encephalopathy has almost completely resolved. At this time it seems to be likely secondary to decreasing levels are antiepileptics due to meropenem given at [facility] for possible hospital-acquired pneumonia as well as electrolyte abnormalities including hyERonatremia. -EEG on 04/19/2018 shows occasional sharp activity over right frontotemporal region which neurology is attributing to a meningioma. B12 and folate replete, RPR negative. Continue antiepileptics per below. Brain MRI shows no acute process but does show old stroke andmeningioma.  Awaiting placement   Possible recent pneumonia/bacterial tracheitis: Patient reportedly was treated for  pneumonia at [facility] with meropenem and vancomycin. There is no culture results from this that of return. Of note there was a concern that meropenem could decrease the valproic acid level and cause patient to have a seizure. At this time the relevance of this respiratory culture from 04/19/2018 growing Pseudomonas is unclear particularly the patient does not have any other infectious symptoms and a procalcitonin is quite low. blood cultures on 04/18/2018 growing 1 out of 2 coagulase-negative staph. Respiratory culture from 04/19/2018 growing Pseudomonas. Repeat respiratory culture from 04/21/2018 no growth to date. Procalcitonin was undetectable on admission  C diff colitis.Likely due to recent antibiotic use at [facility]. StartedPO vanc. 12/16>>. Monitor   Seizure disorder:Restartlevetiracetam thousand milligrams twice daily by tube. Restartedvalproic acid 250 mg twice daily by tube  Torticollis. Contphysical therapy  Chronic respiratory failure status post tracheostomy: PCCM following, appreciate t recommendations  Hypernatremiawith PEG tube dependence: Resolved with free water flushes and D5W. Continue free water flushes 200 mL's every 8 hours. Vital 1.5 @ 40 mL/hr with 30 mL Prostat BID  Chronic atrial fibrillation complicated by CVA:. Continue beta-blocker. Continue apixaban 2.5 mg twice daily  Hypertension/hyperlipidemia/coronary artery disease status post stent: Continue aspirin 81 mg. continue carvedilol 6.25 mg twice daily  Anemia: This appears to be stable at this time. Suspect most likely iron deficiency anemia and anemia of chronic disease.   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 (Active)  04/18/18 1814  Location: Vertebral column  Location Orientation:   Staging: Stage IV - Full thickness tissue loss with exposed bone, tendon or muscle.  Wound Description (Comments):   Present on Admission: Yes       Pressure Injury 04/18/18 Unstageable - Full thickness tissue loss in which the base of the ulcer is  covered by slough (yellow, tan, gray, green or brown) and/or eschar (tan, brown or black) in the wound bed. Has tunneling at 78 o clock, yellow slough aro (Active)  04/18/18 1815  Location: Sacrum  Location Orientation:   Staging: Unstageable - Full thickness tissue loss in which the base of the ulcer is covered by slough (yellow, tan, gray, green or brown) and/or eschar (tan, brown or black) in the wound bed.  Wound Description (Comments): Has tunneling at 49 o clock, yellow slough around edges of wound bed  Present on Admission: Yes      Nutrition Problem: Increased nutrient needs Etiology: wound healing     Signs/Symptoms: estimated needs    Interventions: Tube feeding, Prostat, MVI, Juven  Estimated body mass index is 26.98 kg/m as calculated from the following:   Height as of this encounter: '5\' 4"'$  (1.626 m).   Weight as of this encounter: 71.3 kg.  DVT prophylaxis: Eliquis  code Status: Full code Family Communication: None Disposition Plan: . Discharge to [facility]  Consultants:  PCCM and neuro Procedures: EEG Antimicrobials:  None Subjective: Patient resting in bed response to pain  Objective: Vitals:   05/02/18 0800 05/02/18 0900 05/02/18 1000 05/02/18 1100  BP: 117/63 111/72 (!) 153/81 (!) 106/58  Pulse: 78 81 82 72  Resp: 16 17 (!) 21 (!) 21  Temp: 97.6 F (36.4 C)     TempSrc: Axillary     SpO2: 99% 100% 100% 98%  Weight:      Height:        Intake/Output Summary (Last 24 hours) at 05/02/2018 1151 Last data filed at 05/02/2018 1100 Gross per 24 hour  Intake 1423 ml  Output 1625 ml  Net -202 ml   Filed Weights   04/29/18 0455 05/01/18 0416 05/02/18 0500  Weight: 73.5 kg 72.8 kg 71.3 kg    Examination:  General exam: Appears calm and comfortable  Respiratory system: Clear to auscultation. Respiratory effort normal. Cardiovascular system: S1  & S2 heard, RRR. No JVD, murmurs, rubs, gallops or clicks. No pedal edema. Gastrointestinal system: Abdomen is nondistended, soft and nontender. No organomegaly or masses felt. Normal bowel sounds heard.  PEG tube in place Central nervous system: Alert and oriented. No focal neurological deficits. Extremities: Symmetric 5 x 5 power. Skin: No rashes, lesions or ulcers    Data Reviewed: I have personally reviewed following labs and imaging studies  CBC: Recent Labs  Lab 04/28/18 0827  WBC 6.0  HGB 8.5*  HCT 30.9*  MCV 99.0  PLT 657   Basic Metabolic Panel: Recent Labs  Lab 04/28/18 0827  NA 142  K 3.2*  CL 100  CO2 29  GLUCOSE 119*  BUN 34*  CREATININE 0.33*  CALCIUM 9.4   GFR: Estimated Creatinine Clearance: 54.3 mL/min (A) (by C-G formula based on SCr of 0.33 mg/dL (L)). Liver Function Tests: Recent Labs  Lab 04/28/18 0827  AST 14*  ALT 12  ALKPHOS 51  BILITOT 0.4  PROT 5.2*  ALBUMIN 2.2*   No results for input(s): LIPASE, AMYLASE in the last 168 hours. No results for input(s): AMMONIA in the last 168 hours. Coagulation Profile: No results for input(s): INR, PROTIME in the last 168 hours. Cardiac Enzymes: No results for input(s): CKTOTAL, CKMB, CKMBINDEX, TROPONINI in the last 168 hours. BNP (last 3 results) No results for input(s): PROBNP in the last 8760 hours. HbA1C: No results for input(s): HGBA1C in the last 72 hours. CBG: Recent Labs  Lab 05/01/18 2311 05/01/18  2344 05/02/18 0324 05/02/18 0750 05/02/18 1123  GLUCAP 87 98 88 95 93   Lipid Profile: No results for input(s): CHOL, HDL, LDLCALC, TRIG, CHOLHDL, LDLDIRECT in the last 72 hours. Thyroid Function Tests: No results for input(s): TSH, T4TOTAL, FREET4, T3FREE, THYROIDAB in the last 72 hours. Anemia Panel: No results for input(s): VITAMINB12, FOLATE, FERRITIN, TIBC, IRON, RETICCTPCT in the last 72 hours. Sepsis Labs: No results for input(s): PROCALCITON, LATICACIDVEN in the last 168  hours.  Recent Results (from the past 240 hour(s))  C difficile quick scan w PCR reflex     Status: Abnormal   Collection Time: 04/24/18 10:10 PM  Result Value Ref Range Status   C Diff antigen POSITIVE (A) NEGATIVE Final   C Diff toxin NEGATIVE NEGATIVE Final   C Diff interpretation Results are indeterminate. See PCR results.  Final    Comment: Performed at [facility], [place] [address], [place], [place] [address]  C. Diff by PCR, Reflexed     Status: Abnormal   Collection Time: 04/24/18 10:10 PM  Result Value Ref Range Status   Toxigenic C. Difficile by PCR POSITIVE (A) NEGATIVE Final    Comment: Positive for toxigenic C. difficile with little to no toxin production. Only treat if clinical presentation suggests symptomatic illness. Performed at [facility], [place] [address] [place],  [address]          Radiology Studies: No results found.      Scheduled Meds: . apixaban  2.5 mg Per Tube BID  . aspirin  81 mg Oral Daily  . carvedilol  6.25 mg Oral BID WC  . chlorhexidine gluconate (MEDLINE KIT)  15 mL Mouth Rinse BID  . Chlorhexidine Gluconate Cloth  6 each Topical Daily  . collagenase   Topical Daily  . famotidine  20 mg Oral QHS  . feeding supplement (PRO-STAT SUGAR FREE 64)  30 mL Per Tube BID  . feeding supplement (VITAL HIGH PROTEIN)  1,000 mL Per Tube Q24H  . free water  200 mL Per Tube Q8H  . furosemide  40 mg Oral Daily  . levETIRAcetam  1,000 mg Oral BID  . mouth rinse  15 mL Mouth Rinse 10 times per day  . multivitamin  15 mL Per Tube Daily  . nutrition supplement (JUVEN)  1 packet Per Tube BID BM  . pantoprazole sodium  40 mg Per Tube Daily  . sodium chloride flush  10-40 mL Intracatheter Q12H  . sodium chloride flush  3 mL Intravenous Q12H  . valproic acid  500 mg Oral BID  . vancomycin  125 mg Oral QID   Continuous Infusions:   LOS: 14 days      Georgette Shell, MD [facility]  If 7PM-7AM, please  contact night-coverage [email or web address] Password [facility] 05/02/2018, 11:51 AM

## 2018-05-02 NOTE — Progress Notes (Addendum)
Per physician, Patient medically stable to discharge back to [facility].  [facility] admission coordinator Erline Levine will accept the patient today. CSW notified the patient daughter [name].  Nurse call report to: [phone] Patient can discharge after noon today.   CSW faxed D/C summary and FL2 to [phone] [facility] will transport.   Kathrin Greathouse, Marlinda Mike, MSW Clinical Social Worker  [phone] 05/02/2018  12:01 PM

## 2018-05-02 NOTE — Discharge Summary (Signed)
Physician Discharge Summary  Melissa Cochran HKV:[id number] DOB: Feb 27, 1936 DOA: 04/18/2018  PCP: [name], MD  Admit date: 04/18/2018 Discharge date: 05/02/2018  [facility]position: [facility] Recommendations for Outpatient Follow-up:  1. Follow up with PCP in 1-2 weeks 2. Please obtain BMP/CBC in one week   Home Health none Equipment/Devices: None Discharge Condition: Stable CODE STATUS: Full code Diet recommendation: Tube feeds Brief/Interim Summary:82 year old with past medical history relevant for chronic atrial fibrillation on apixaban, hypertension, coronary artery disease status post stent in 5643, chronic systolic heart failure with EF of 45 to 50% by echo on 06/28/2017, nephrolithiasis complicated by hydronephrosis status post stent placement in 2017, history of bilateral hydronephrosis, CVA, seizure disorder complicated by chronic respiratory failure with ventilator dependence and PEG tube placement who was admitted from [facility] for lethargy and electrolyte imbalances, found to have C diff    Discharge Diagnoses:  Principal Problem:   Acute encephalopathy Active Problems:   Essential (primary) hypertension   History of cardiac catheterization   Hyperlipidemia   DVT (deep venous thrombosis) (HCC)   Venous ulcer of ankle (HCC)   Seizures (HCC)   A-fib (HCC)   CVA (cerebral vascular accident) ([place])   Chronic respiratory failure ([place])   Acute respiratory failure with hypoxia ([place])   Acute metabolic encephalopathy: her baseline is unclear though reportedly she was able to speak using a Passy-Muir valveat [facility] and was working on swallowing.Her encephalopathy has almost completely resolved. At this time it seems to be likely secondary to decreasing levels are antiepileptics due to meropenem given at [facility] for possible hospital-acquired pneumonia as well as electrolyte abnormalities including hyERonatremia. -EEG on 04/19/2018 shows  occasional sharp activity over right frontotemporal region which neurology is attributing to a meningioma. B12 and folate replete, RPR negative. Continue antiepileptics per below. Brain MRI shows no acute process but does show old stroke andmeningioma.    Possible recent pneumonia/bacterial tracheitis: Patient reportedly was treated for pneumonia at [facility] with meropenem and vancomycin. There is no culture results from this that of return. Of note there was a concern that meropenem could decrease the valproic acid level and cause patient to have a seizure. At this time the relevance of this respiratory culture from 04/19/2018 growing Pseudomonas is unclear particularly the patient does not have any other infectious symptoms and a procalcitonin is quite low. blood cultures on 04/18/2018 growing 1 out of 2 coagulase-negative staph. Respiratory culture from 04/19/2018 growing Pseudomonas. Repeat respiratory culture from 04/21/2018 no growth to date. Procalcitonin was undetectable on admission   C diff colitis.Likely due to recent antibiotic use at [facility]. StartedPO vanc. 12/16>>.   Seizure disorder:Restartlevetiracetam thousand milligrams twice daily by tube. Restartedvalproic acid 250 mg twice daily by tube  Torticollis. Contphysical therapy  Chronic respiratory failure status post tracheostomy: Current vent settings  Vent Mode: PRVC FiO2 (%):  [28 %] 28 % Set Rate:  [12 bmp] 12 bmp Vt Set:  [430 mL] 430 mL PEEP:  [5 cmH20-8 cmH20] 5 cmH20 Plateau Pressure:  [18 cmH20-20 cmH20] 18 cmH20     Hypernatremiawith PEG tube dependence: Resolved with free water flushes and D5W. Continue free water flushes 200 mL's every 8 hours. Vital 1.5 @ 40 mL/hr with 30 mL Prostat BID  Chronic atrial fibrillation complicated by CVA:. Continue beta-blocker. Continue apixaban 2.5 mg twice daily  Hypertension/hyperlipidemia/coronary artery disease status post stent: Continue aspirin 81 mg. continue  carvedilol 6.25 mg twice daily  Anemia: This appears to be stable at this time.  Suspect most likely iron deficiency anemia and anemia of chronic disease.    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 (Active)  04/18/18 1814  Location: Vertebral column  Location Orientation:   Staging: Stage IV - Full thickness tissue loss with exposed bone, tendon or muscle.  Wound Description (Comments):   Present on Admission: Yes     Pressure Injury 04/18/18 Unstageable - Full thickness tissue loss in which the base of the ulcer is covered by slough (yellow, tan, gray, green or brown) and/or eschar (tan, brown or black) in the wound bed. Has tunneling at 56 o clock, yellow slough aro (Active)  04/18/18 1815  Location: Sacrum  Location Orientation:   Staging: Unstageable - Full thickness tissue loss in which the base of the ulcer is covered by slough (yellow, tan, gray, green or brown) and/or eschar (tan, brown or black) in the wound bed.  Wound Description (Comments): Has tunneling at 17 o clock, yellow slough around edges of wound bed  Present on Admission: Yes      Nutrition Problem: Increased nutrient needs Etiology: wound healing    Signs/Symptoms: estimated needs     Interventions: Tube feeding, Prostat, MVI, Juven  Estimated body mass index is 26.98 kg/m as calculated from the following:   Height as of this encounter: 5\' 4"  (1.626 m).   Weight as of this encounter: 71.3 kg.  Discharge Instructions  Discharge Instructions    Call MD for:  difficulty breathing, headache or visual disturbances   Complete by:  As directed    Call MD for:  difficulty breathing, headache or visual disturbances   Complete by:  As directed    Call MD for:  hives   Complete by:  As directed    Call MD for:  persistant nausea and vomiting   Complete by:  As directed    Call MD for:  persistant nausea and vomiting   Complete by:  As  directed    Call MD for:  redness, tenderness, or signs of infection (pain, swelling, redness, odor or green/yellow discharge around incision site)   Complete by:  As directed    Call MD for:  severe uncontrolled pain   Complete by:  As directed    Call MD for:  temperature >100.4   Complete by:  As directed    Call MD for:  temperature >100.4   Complete by:  As directed    Diet - low sodium heart healthy   Complete by:  As directed    Diet - low sodium heart healthy   Complete by:  As directed    Discharge instructions   Complete by:  As directed    Please follow-up with your primary care doctor in 1 week.   Increase activity slowly   Complete by:  As directed    Increase activity slowly   Complete by:  As directed      Allergies as of 05/02/2018   No Known Allergies     Medication List    STOP taking these medications   piperacillin-tazobactam 3.375 GM/50ML IVPB Commonly known as:  ZOSYN   vancomycin 1,000 mg in sodium chloride 0.9 % 250 mL     TAKE these medications   acetaminophen 325 MG tablet Commonly known as:  TYLENOL Take 2 tablets (650 mg total) by mouth every 6 (six) hours as needed for mild pain (or Fever >/= 101).   apixaban 2.5 MG Tabs  tablet Commonly known as:  ELIQUIS Place 2.5 mg into feeding tube 2 (two) times daily.   aspirin 81 MG chewable tablet Chew 81 mg by mouth daily.   carvedilol 6.25 MG tablet Commonly known as:  COREG Take 1 tablet (6.25 mg total) by mouth 2 (two) times daily with a meal.   chlorhexidine 0.12 % solution Commonly known as:  PERIDEX Use as directed 15 mLs in the mouth or throat 2 (two) times daily.   diazepam 5 MG tablet Commonly known as:  VALIUM Take 2.5 mg by mouth every 12 (twelve) hours.   famotidine 20 MG tablet Commonly known as:  PEPCID Take 20 mg by mouth at bedtime.   feeding supplement (PRO-STAT SUGAR FREE 64) Liqd Place 30 mLs into feeding tube 2 (two) times daily.   furosemide 40 MG  tablet Commonly known as:  LASIX Take 1 tablet (40 mg total) by mouth daily.   ipratropium-albuterol 0.5-2.5 (3) MG/3ML Soln Commonly known as:  DUONEB Take 3 mLs by nebulization every 6 (six) hours as needed (SOB).   ISOSOURCE 1.5 CAL Liqd 230 mLs by Gastric Tube route every 4 (four) hours. What changed:  Another medication with the same name was added. Make sure you understand how and when to take each.   feeding supplement (VITAL HIGH PROTEIN) Liqd liquid Place 1,000 mLs into feeding tube daily. What changed:  You were already taking a medication with the same name, and this prescription was added. Make sure you understand how and when to take each.   nutrition supplement (JUVEN) Pack Place 1 packet into feeding tube 2 (two) times daily between meals. What changed:  You were already taking a medication with the same name, and this prescription was added. Make sure you understand how and when to take each.   levETIRAcetam 100 MG/ML solution Commonly known as:  KEPPRA Take 10 mLs (1,000 mg total) by mouth 2 (two) times daily.   multivitamin Liqd Place 15 mLs into feeding tube daily.   Valproate Sodium 250 MG/5ML Soln solution Commonly known as:  DEPAKENE Take 10 mLs (500 mg total) by mouth 2 (two) times daily. What changed:  how much to take   vancomycin 50 mg/mL  oral solution Commonly known as:  VANCOCIN Take 2.5 mLs (125 mg total) by mouth 4 (four) times daily. Continue till 05/05/2018.       No Known Allergies  Consultations:  PCCM and neuro   Procedures/Studies: Ct Head Wo Contrast  Result Date: 04/18/2018 CLINICAL DATA:  Encephalopathy EXAM: CT HEAD WITHOUT CONTRAST TECHNIQUE: Contiguous axial images were obtained from the base of the skull through the vertex without intravenous contrast. COMPARISON:  CT 08/24/2017, MRI 06/28/2017 FINDINGS: Brain: Evaluation is limited by patient positioning. No acute territorial infarction, hemorrhage or new mass is  visualized. 2 cm right cranial convexity meningioma, no change. Moderate-to-marked atrophy. Small focus of encephalomalacia in the right occipital lobe. Moderate small vessel ischemic changes of the white matter. Stable ventricle size. Vascular: No hyperdense vessels.  Carotid vascular calcification. Skull: No fracture Sinuses/Orbits: Mucosal disease in the sphenoid and ethmoid sinuses. Other: None IMPRESSION: 1. No definite CT evidence for acute intracranial abnormality. 2. Atrophy and small vessel ischemic changes of the white matter. 3. Stable 2 cm right cranial convexity meningioma. Electronically Signed   By: Donavan Foil M.D.   On: 04/18/2018 18:16   Mr Brain Wo Contrast  Result Date: 04/20/2018 CLINICAL DATA:  Unexplained altered level of consciousness EXAM: MRI HEAD WITHOUT CONTRAST  TECHNIQUE: Multiplanar, multiecho pulse sequences of the brain and surrounding structures were obtained without intravenous contrast. COMPARISON:  Head CT from 2 days ago.  Brain MRI 06/28/2017 FINDINGS: Brain: No acute infarction, hemorrhage, hydrocephalus, extra-axial collection or mass lesion. Advanced generalized cortical atrophy with predilection for the right hemisphere. There is asymmetric gliosis in the right cerebral white matter. This may be related to chronic severe ischemia on the right. There is no historical indication of focal seizures. Chronic small vessel ischemic type changes seen in the bilateral thalamus. Known meningioma along the posterior right frontal convexity measuring 2.2 cm. There is local cortical mass effect without brain edema. Vascular: Major flow voids are preserved Skull and upper cervical spine: Negative for marrow lesion Sinuses/Orbits: Partial right mastoid opacification with negative nasopharynx. Sphenoid sinus fluid levels. IMPRESSION: 1. No acute finding. 2. Advanced atrophy asymmetrically severe on the right where there is also greater white matter gliosis. This pattern can be seen  with chronic asymmetric ischemia and high-grade arterial stenosis in the neck or with chronic unilateral seizures. 3. Incidental meningioma along the right convexity that is stable. Electronically Signed   By: Monte Fantasia M.D.   On: 04/20/2018 12:30   Portable Chest Xray  Result Date: 04/19/2018 CLINICAL DATA:  Acute respiratory failure with hypoxia. EXAM: PORTABLE CHEST 1 VIEW COMPARISON:  Radiograph of April 18, 2018. FINDINGS: Tracheostomy tube is in grossly good position. Right-sided PICC line is unchanged in position. Stable cardiomediastinal silhouette. No pneumothorax is noted. Increased bibasilar subsegmental atelectasis is noted with minimal pleural effusions. Bony thorax is unremarkable. IMPRESSION: Stable support apparatus. Increased bibasilar opacities as described above. Electronically Signed   By: Marijo Conception, M.D.   On: 04/19/2018 07:41   Dg Chest Port 1 View  Result Date: 04/18/2018 CLINICAL DATA:  Electrolyte imbalance, increased lethargy. The patient is a tracheostomy patient and is undergoing ventilation. History of CHF and COPD. EXAM: PORTABLE CHEST 1 VIEW COMPARISON:  Portable chest x-ray of August 24, 2017 FINDINGS: The lungs are adequately inflated. There is no focal infiltrate. There is no pleural effusion or pneumothorax. The cardiac silhouette is mildly enlarged. The pulmonary vascularity is normal. The endotracheal tube tip projects approximately 3 cm above the carina. The right-sided PICC line tip projects over the midportion of the SVC. There is calcification in the wall of the aortic arch. There is calcification of the mitral valvular annulus. IMPRESSION: No acute pneumonia. Stable mild cardiomegaly without pulmonary edema. The endotracheal tube placed via the tracheostomy is in reasonable position. Thoracic aortic atherosclerosis. Electronically Signed   By: David  Martinique M.D.   On: 04/18/2018 12:18   Dg Chest Port 1v Same Day  Result Date: 04/23/2018 CLINICAL  DATA:  Pneumonia. EXAM: PORTABLE CHEST 1 VIEW COMPARISON:  Radiograph of April 19, 2018. FINDINGS: Stable cardiomediastinal silhouette. Tracheostomy tube is in good position. Right-sided PICC line is unchanged in position. No pneumothorax is noted. Mild bibasilar subsegmental atelectasis is noted with minimal pleural effusions. Bony thorax is unremarkable. IMPRESSION: Mild bibasilar subsegmental atelectasis with minimal pleural effusions. Stable support apparatus. Electronically Signed   By: Marijo Conception, M.D.   On: 04/23/2018 08:04    (Echo, Carotid, EGD, Colonoscopy, ERCP)    Subjective:   Discharge Exam: Vitals:   05/02/18 1000 05/02/18 1100  BP: (!) 153/81 (!) 106/58  Pulse: 82 72  Resp: (!) 21 (!) 21  Temp:    SpO2: 100% 98%   Vitals:   05/02/18 0800 05/02/18 0900 05/02/18 1000 05/02/18 1100  BP: 117/63 111/72 (!) 153/81 (!) 106/58  Pulse: 78 81 82 72  Resp: 16 17 (!) 21 (!) 21  Temp: 97.6 F (36.4 C)     TempSrc: Axillary     SpO2: 99% 100% 100% 98%  Weight:      Height:        General: Pt is resting not in acute distress Cardiovascular: RRR, S1/S2 +, no rubs, no gallops Respiratory: CTA bilaterally, no wheezing, no rhonchi trach with vent Abdominal: Soft, NT, ND, bowel sounds + PEG tube in place rectal tube in place Extremities: 1+ pitting edema bilaterally   The results of significant diagnostics from this hospitalization (including imaging, microbiology, ancillary and laboratory) are listed below for reference.     Microbiology: Recent Results (from the past 240 hour(s))  C difficile quick scan w PCR reflex     Status: Abnormal   Collection Time: 04/24/18 10:10 PM  Result Value Ref Range Status   C Diff antigen POSITIVE (A) NEGATIVE Final   C Diff toxin NEGATIVE NEGATIVE Final   C Diff interpretation Results are indeterminate. See PCR results.  Final    Comment: Performed at [facility], [place] [address] [place], [place] [address]   C. Diff by PCR, Reflexed     Status: Abnormal   Collection Time: 04/24/18 10:10 PM  Result Value Ref Range Status   Toxigenic C. Difficile by PCR POSITIVE (A) NEGATIVE Final    Comment: Positive for toxigenic C. difficile with little to no toxin production. Only treat if clinical presentation suggests symptomatic illness. Performed at [facility], [place] [address], [place], [place] [address]      Labs: BNP (last 3 results) No results for input(s): BNP in the last 8760 hours. Basic Metabolic Panel: Recent Labs  Lab 04/28/18 0827  NA 142  K 3.2*  CL 100  CO2 29  GLUCOSE 119*  BUN 34*  CREATININE 0.33*  CALCIUM 9.4   Liver Function Tests: Recent Labs  Lab 04/28/18 0827  AST 14*  ALT 12  ALKPHOS 51  BILITOT 0.4  PROT 5.2*  ALBUMIN 2.2*   No results for input(s): LIPASE, AMYLASE in the last 168 hours. No results for input(s): AMMONIA in the last 168 hours. CBC: Recent Labs  Lab 04/28/18 0827  WBC 6.0  HGB 8.5*  HCT 30.9*  MCV 99.0  PLT 220   Cardiac Enzymes: No results for input(s): CKTOTAL, CKMB, CKMBINDEX, TROPONINI in the last 168 hours. BNP: Invalid input(s): POCBNP CBG: Recent Labs  Lab 05/01/18 2311 05/01/18 2344 05/02/18 0324 05/02/18 0750 05/02/18 1123  GLUCAP 87 98 88 95 93   D-Dimer No results for input(s): DDIMER in the last 72 hours. Hgb A1c No results for input(s): HGBA1C in the last 72 hours. Lipid Profile No results for input(s): CHOL, HDL, LDLCALC, TRIG, CHOLHDL, LDLDIRECT in the last 72 hours. Thyroid function studies No results for input(s): TSH, T4TOTAL, T3FREE, THYROIDAB in the last 72 hours.  Invalid input(s): FREET3 Anemia work up No results for input(s): VITAMINB12, FOLATE, FERRITIN, TIBC, IRON, RETICCTPCT in the last 72 hours. Urinalysis    Component Value Date/Time   COLORURINE YELLOW 04/18/2018 1534   APPEARANCEUR CLEAR 04/18/2018 1534   APPEARANCEUR Cloudy (A) 01/29/2016 1126   LABSPEC 1.020 04/18/2018 1534    LABSPEC 1.020 01/23/2014 1047   PHURINE 6.0 04/18/2018 1534   GLUCOSEU NEGATIVE 04/18/2018 [place] 01/23/2014 [place] 04/18/2018 [place]  04/18/2018 1534   BILIRUBINUR Negative 01/13/2016 1110   BILIRUBINUR NEGATIVE 01/23/2014 1047   KETONESUR NEGATIVE 04/18/2018 1534   PROTEINUR 30 (A) 04/18/2018 1534   UROBILINOGEN 0.2 08/04/2015 1140   NITRITE NEGATIVE 04/18/2018 1534   LEUKOCYTESUR NEGATIVE 04/18/2018 1534   LEUKOCYTESUR 3+ (A) 01/13/2016 1110   LEUKOCYTESUR TRACE 01/23/2014 1047   Sepsis Labs Invalid input(s): PROCALCITONIN,  WBC,  LACTICIDVEN Microbiology Recent Results (from the past 240 hour(s))  C difficile quick scan w PCR reflex     Status: Abnormal   Collection Time: 04/24/18 10:10 PM  Result Value Ref Range Status   C Diff antigen POSITIVE (A) NEGATIVE Final   C Diff toxin NEGATIVE NEGATIVE Final   C Diff interpretation Results are indeterminate. See PCR results.  Final    Comment: Performed at [facility], [place] [address] [place], [place] [address]  C. Diff by PCR, Reflexed     Status: Abnormal   Collection Time: 04/24/18 10:10 PM  Result Value Ref Range Status   Toxigenic C. Difficile by PCR POSITIVE (A) NEGATIVE Final    Comment: Positive for toxigenic C. difficile with little to no toxin production. Only treat if clinical presentation suggests symptomatic illness. Performed at [facility], [place] [address], [place], [place] [address]      Time coordinating discharge:  34 minutes  SIGNED:   Georgette Shell, MD  [facility] 05/02/2018, 11:54 AM Pager   If 7PM-7AM, please contact night-coverage [email or web address] Password [email or web address]

## 2018-05-03 DIAGNOSIS — I110 Hypertensive heart disease with heart failure: Secondary | ICD-10-CM | POA: Diagnosis not present

## 2018-05-03 DIAGNOSIS — M6249 Contracture of muscle, multiple sites: Secondary | ICD-10-CM | POA: Diagnosis not present

## 2018-05-03 DIAGNOSIS — J9600 Acute respiratory failure, unspecified whether with hypoxia or hypercapnia: Secondary | ICD-10-CM | POA: Diagnosis not present

## 2018-05-03 DIAGNOSIS — Z9911 Dependence on respirator [ventilator] status: Secondary | ICD-10-CM | POA: Diagnosis not present

## 2018-05-03 DIAGNOSIS — I509 Heart failure, unspecified: Secondary | ICD-10-CM | POA: Diagnosis not present

## 2018-05-03 DIAGNOSIS — J9610 Chronic respiratory failure, unspecified whether with hypoxia or hypercapnia: Secondary | ICD-10-CM | POA: Diagnosis not present

## 2018-05-03 DIAGNOSIS — Z87891 Personal history of nicotine dependence: Secondary | ICD-10-CM | POA: Diagnosis not present

## 2018-05-03 DIAGNOSIS — Z430 Encounter for attention to tracheostomy: Secondary | ICD-10-CM | POA: Diagnosis not present

## 2018-05-04 DIAGNOSIS — M6249 Contracture of muscle, multiple sites: Secondary | ICD-10-CM | POA: Diagnosis not present

## 2018-05-05 DIAGNOSIS — I509 Heart failure, unspecified: Secondary | ICD-10-CM | POA: Diagnosis not present

## 2018-05-05 DIAGNOSIS — M6249 Contracture of muscle, multiple sites: Secondary | ICD-10-CM | POA: Diagnosis not present

## 2018-05-05 DIAGNOSIS — I110 Hypertensive heart disease with heart failure: Secondary | ICD-10-CM | POA: Diagnosis not present

## 2018-05-05 DIAGNOSIS — Z430 Encounter for attention to tracheostomy: Secondary | ICD-10-CM | POA: Diagnosis not present

## 2018-05-05 DIAGNOSIS — J9610 Chronic respiratory failure, unspecified whether with hypoxia or hypercapnia: Secondary | ICD-10-CM | POA: Diagnosis not present

## 2018-05-05 DIAGNOSIS — Z87891 Personal history of nicotine dependence: Secondary | ICD-10-CM | POA: Diagnosis not present

## 2018-05-05 DIAGNOSIS — J181 Lobar pneumonia, unspecified organism: Secondary | ICD-10-CM | POA: Diagnosis not present

## 2018-05-05 DIAGNOSIS — Z9911 Dependence on respirator [ventilator] status: Secondary | ICD-10-CM | POA: Diagnosis not present

## 2018-05-05 DIAGNOSIS — J9600 Acute respiratory failure, unspecified whether with hypoxia or hypercapnia: Secondary | ICD-10-CM | POA: Diagnosis not present

## 2018-05-06 DIAGNOSIS — M6249 Contracture of muscle, multiple sites: Secondary | ICD-10-CM | POA: Diagnosis not present

## 2018-05-08 DIAGNOSIS — M6249 Contracture of muscle, multiple sites: Secondary | ICD-10-CM | POA: Diagnosis not present

## 2018-05-10 DIAGNOSIS — M6249 Contracture of muscle, multiple sites: Secondary | ICD-10-CM | POA: Diagnosis not present

## 2018-05-11 DIAGNOSIS — M6249 Contracture of muscle, multiple sites: Secondary | ICD-10-CM | POA: Diagnosis not present

## 2018-05-12 DIAGNOSIS — M6249 Contracture of muscle, multiple sites: Secondary | ICD-10-CM | POA: Diagnosis not present

## 2018-05-15 DIAGNOSIS — M6249 Contracture of muscle, multiple sites: Secondary | ICD-10-CM | POA: Diagnosis not present

## 2018-05-16 DIAGNOSIS — M6249 Contracture of muscle, multiple sites: Secondary | ICD-10-CM | POA: Diagnosis not present

## 2018-05-17 DIAGNOSIS — M6249 Contracture of muscle, multiple sites: Secondary | ICD-10-CM | POA: Diagnosis not present

## 2018-05-18 DIAGNOSIS — J181 Lobar pneumonia, unspecified organism: Secondary | ICD-10-CM | POA: Diagnosis not present

## 2018-05-18 DIAGNOSIS — R1310 Dysphagia, unspecified: Secondary | ICD-10-CM | POA: Diagnosis not present

## 2018-05-18 DIAGNOSIS — M6249 Contracture of muscle, multiple sites: Secondary | ICD-10-CM | POA: Diagnosis not present

## 2018-05-18 DIAGNOSIS — J189 Pneumonia, unspecified organism: Secondary | ICD-10-CM | POA: Diagnosis not present

## 2018-05-18 DIAGNOSIS — J9621 Acute and chronic respiratory failure with hypoxia: Secondary | ICD-10-CM | POA: Diagnosis not present

## 2018-05-18 DIAGNOSIS — I4891 Unspecified atrial fibrillation: Secondary | ICD-10-CM | POA: Diagnosis not present

## 2018-05-19 DIAGNOSIS — J9600 Acute respiratory failure, unspecified whether with hypoxia or hypercapnia: Secondary | ICD-10-CM | POA: Diagnosis not present

## 2018-05-19 DIAGNOSIS — Z9911 Dependence on respirator [ventilator] status: Secondary | ICD-10-CM | POA: Diagnosis not present

## 2018-05-19 DIAGNOSIS — M6249 Contracture of muscle, multiple sites: Secondary | ICD-10-CM | POA: Diagnosis not present

## 2018-05-22 DIAGNOSIS — M6249 Contracture of muscle, multiple sites: Secondary | ICD-10-CM | POA: Diagnosis not present

## 2018-05-23 DIAGNOSIS — R0989 Other specified symptoms and signs involving the circulatory and respiratory systems: Secondary | ICD-10-CM | POA: Diagnosis not present

## 2018-05-23 DIAGNOSIS — M6249 Contracture of muscle, multiple sites: Secondary | ICD-10-CM | POA: Diagnosis not present

## 2018-05-24 DIAGNOSIS — M6249 Contracture of muscle, multiple sites: Secondary | ICD-10-CM | POA: Diagnosis not present

## 2018-05-25 DIAGNOSIS — M6249 Contracture of muscle, multiple sites: Secondary | ICD-10-CM | POA: Diagnosis not present

## 2018-05-25 DIAGNOSIS — B351 Tinea unguium: Secondary | ICD-10-CM | POA: Diagnosis not present

## 2018-05-25 DIAGNOSIS — I739 Peripheral vascular disease, unspecified: Secondary | ICD-10-CM | POA: Diagnosis not present

## 2018-05-26 DIAGNOSIS — M6249 Contracture of muscle, multiple sites: Secondary | ICD-10-CM | POA: Diagnosis not present

## 2018-05-29 DIAGNOSIS — J9600 Acute respiratory failure, unspecified whether with hypoxia or hypercapnia: Secondary | ICD-10-CM | POA: Diagnosis not present

## 2018-05-29 DIAGNOSIS — M6249 Contracture of muscle, multiple sites: Secondary | ICD-10-CM | POA: Diagnosis not present

## 2018-05-29 DIAGNOSIS — Z9911 Dependence on respirator [ventilator] status: Secondary | ICD-10-CM | POA: Diagnosis not present

## 2018-05-30 DIAGNOSIS — M6249 Contracture of muscle, multiple sites: Secondary | ICD-10-CM | POA: Diagnosis not present

## 2018-05-31 DIAGNOSIS — M6249 Contracture of muscle, multiple sites: Secondary | ICD-10-CM | POA: Diagnosis not present

## 2018-06-01 DIAGNOSIS — L89104 Pressure ulcer of unspecified part of back, stage 4: Secondary | ICD-10-CM | POA: Diagnosis not present

## 2018-06-01 DIAGNOSIS — Z9911 Dependence on respirator [ventilator] status: Secondary | ICD-10-CM | POA: Diagnosis not present

## 2018-06-01 DIAGNOSIS — L89154 Pressure ulcer of sacral region, stage 4: Secondary | ICD-10-CM | POA: Diagnosis not present

## 2018-06-01 DIAGNOSIS — M6249 Contracture of muscle, multiple sites: Secondary | ICD-10-CM | POA: Diagnosis not present

## 2018-06-01 DIAGNOSIS — I6789 Other cerebrovascular disease: Secondary | ICD-10-CM | POA: Diagnosis not present

## 2018-06-02 DIAGNOSIS — J9600 Acute respiratory failure, unspecified whether with hypoxia or hypercapnia: Secondary | ICD-10-CM | POA: Diagnosis not present

## 2018-06-02 DIAGNOSIS — M6249 Contracture of muscle, multiple sites: Secondary | ICD-10-CM | POA: Diagnosis not present

## 2018-06-02 DIAGNOSIS — Z9911 Dependence on respirator [ventilator] status: Secondary | ICD-10-CM | POA: Diagnosis not present

## 2018-06-05 DIAGNOSIS — M6249 Contracture of muscle, multiple sites: Secondary | ICD-10-CM | POA: Diagnosis not present

## 2018-06-06 DIAGNOSIS — M6249 Contracture of muscle, multiple sites: Secondary | ICD-10-CM | POA: Diagnosis not present

## 2018-06-06 DIAGNOSIS — L89159 Pressure ulcer of sacral region, unspecified stage: Secondary | ICD-10-CM | POA: Diagnosis not present

## 2018-06-06 DIAGNOSIS — L89154 Pressure ulcer of sacral region, stage 4: Secondary | ICD-10-CM | POA: Diagnosis not present

## 2018-06-06 DIAGNOSIS — S21209A Unspecified open wound of unspecified back wall of thorax without penetration into thoracic cavity, initial encounter: Secondary | ICD-10-CM | POA: Diagnosis not present

## 2018-06-06 DIAGNOSIS — I96 Gangrene, not elsewhere classified: Secondary | ICD-10-CM | POA: Diagnosis not present

## 2018-06-06 DIAGNOSIS — J9610 Chronic respiratory failure, unspecified whether with hypoxia or hypercapnia: Secondary | ICD-10-CM | POA: Diagnosis not present

## 2018-06-06 DIAGNOSIS — L89104 Pressure ulcer of unspecified part of back, stage 4: Secondary | ICD-10-CM | POA: Diagnosis not present

## 2018-06-06 DIAGNOSIS — L89894 Pressure ulcer of other site, stage 4: Secondary | ICD-10-CM | POA: Diagnosis not present

## 2018-06-07 DIAGNOSIS — M6249 Contracture of muscle, multiple sites: Secondary | ICD-10-CM | POA: Diagnosis not present

## 2018-06-08 DIAGNOSIS — M6249 Contracture of muscle, multiple sites: Secondary | ICD-10-CM | POA: Diagnosis not present

## 2018-06-09 DIAGNOSIS — M6249 Contracture of muscle, multiple sites: Secondary | ICD-10-CM | POA: Diagnosis not present

## 2018-06-10 ENCOUNTER — Emergency Department (HOSPITAL_COMMUNITY): Payer: Medicare Other

## 2018-06-10 ENCOUNTER — Inpatient Hospital Stay (HOSPITAL_COMMUNITY)
Admission: EM | Admit: 2018-06-10 | Discharge: 2018-06-26 | DRG: 870 | Disposition: A | Discharge status: Other Acute Inpatient Hospital | Payer: Medicare Other | Source: Skilled Nursing Facility | Attending: Internal Medicine | Admitting: Internal Medicine

## 2018-06-10 ENCOUNTER — Encounter (HOSPITAL_COMMUNITY): Payer: Self-pay | Admitting: Emergency Medicine

## 2018-06-10 DIAGNOSIS — J189 Pneumonia, unspecified organism: Secondary | ICD-10-CM | POA: Diagnosis present

## 2018-06-10 DIAGNOSIS — I110 Hypertensive heart disease with heart failure: Secondary | ICD-10-CM | POA: Diagnosis present

## 2018-06-10 DIAGNOSIS — N3000 Acute cystitis without hematuria: Secondary | ICD-10-CM | POA: Diagnosis not present

## 2018-06-10 DIAGNOSIS — I4891 Unspecified atrial fibrillation: Secondary | ICD-10-CM

## 2018-06-10 DIAGNOSIS — Z7901 Long term (current) use of anticoagulants: Secondary | ICD-10-CM

## 2018-06-10 DIAGNOSIS — Z7401 Bed confinement status: Secondary | ICD-10-CM

## 2018-06-10 DIAGNOSIS — Z800 Family history of malignant neoplasm of digestive organs: Secondary | ICD-10-CM

## 2018-06-10 DIAGNOSIS — R6520 Severe sepsis without septic shock: Secondary | ICD-10-CM | POA: Diagnosis present

## 2018-06-10 DIAGNOSIS — Z90710 Acquired absence of both cervix and uterus: Secondary | ICD-10-CM

## 2018-06-10 DIAGNOSIS — R403 Persistent vegetative state: Secondary | ICD-10-CM | POA: Diagnosis present

## 2018-06-10 DIAGNOSIS — M21372 Foot drop, left foot: Secondary | ICD-10-CM | POA: Diagnosis present

## 2018-06-10 DIAGNOSIS — D509 Iron deficiency anemia, unspecified: Secondary | ICD-10-CM | POA: Diagnosis present

## 2018-06-10 DIAGNOSIS — Z751 Person awaiting admission to adequate facility elsewhere: Secondary | ICD-10-CM

## 2018-06-10 DIAGNOSIS — D638 Anemia in other chronic diseases classified elsewhere: Secondary | ICD-10-CM | POA: Diagnosis present

## 2018-06-10 DIAGNOSIS — Z960 Presence of urogenital implants: Secondary | ICD-10-CM | POA: Diagnosis present

## 2018-06-10 DIAGNOSIS — Z7982 Long term (current) use of aspirin: Secondary | ICD-10-CM

## 2018-06-10 DIAGNOSIS — A0472 Enterocolitis due to Clostridium difficile, not specified as recurrent: Secondary | ICD-10-CM | POA: Diagnosis present

## 2018-06-10 DIAGNOSIS — L8994 Pressure ulcer of unspecified site, stage 4: Secondary | ICD-10-CM

## 2018-06-10 DIAGNOSIS — J9610 Chronic respiratory failure, unspecified whether with hypoxia or hypercapnia: Secondary | ICD-10-CM | POA: Diagnosis present

## 2018-06-10 DIAGNOSIS — I10 Essential (primary) hypertension: Secondary | ICD-10-CM

## 2018-06-10 DIAGNOSIS — I482 Chronic atrial fibrillation, unspecified: Secondary | ICD-10-CM | POA: Diagnosis present

## 2018-06-10 DIAGNOSIS — J9611 Chronic respiratory failure with hypoxia: Secondary | ICD-10-CM | POA: Diagnosis present

## 2018-06-10 DIAGNOSIS — M21371 Foot drop, right foot: Secondary | ICD-10-CM | POA: Diagnosis present

## 2018-06-10 DIAGNOSIS — I480 Paroxysmal atrial fibrillation: Secondary | ICD-10-CM | POA: Diagnosis not present

## 2018-06-10 DIAGNOSIS — F0390 Unspecified dementia without behavioral disturbance: Secondary | ICD-10-CM | POA: Diagnosis present

## 2018-06-10 DIAGNOSIS — E785 Hyperlipidemia, unspecified: Secondary | ICD-10-CM | POA: Diagnosis present

## 2018-06-10 DIAGNOSIS — L89154 Pressure ulcer of sacral region, stage 4: Secondary | ICD-10-CM | POA: Diagnosis present

## 2018-06-10 DIAGNOSIS — A4159 Other Gram-negative sepsis: Secondary | ICD-10-CM | POA: Diagnosis not present

## 2018-06-10 DIAGNOSIS — Z86718 Personal history of other venous thrombosis and embolism: Secondary | ICD-10-CM

## 2018-06-10 DIAGNOSIS — Z95828 Presence of other vascular implants and grafts: Secondary | ICD-10-CM

## 2018-06-10 DIAGNOSIS — J449 Chronic obstructive pulmonary disease, unspecified: Secondary | ICD-10-CM | POA: Diagnosis present

## 2018-06-10 DIAGNOSIS — Z1624 Resistance to multiple antibiotics: Secondary | ICD-10-CM | POA: Diagnosis present

## 2018-06-10 DIAGNOSIS — I69318 Other symptoms and signs involving cognitive functions following cerebral infarction: Secondary | ICD-10-CM

## 2018-06-10 DIAGNOSIS — E870 Hyperosmolality and hypernatremia: Secondary | ICD-10-CM | POA: Diagnosis not present

## 2018-06-10 DIAGNOSIS — Z930 Tracheostomy status: Secondary | ICD-10-CM | POA: Diagnosis not present

## 2018-06-10 DIAGNOSIS — J9811 Atelectasis: Secondary | ICD-10-CM | POA: Diagnosis not present

## 2018-06-10 DIAGNOSIS — Z9911 Dependence on respirator [ventilator] status: Secondary | ICD-10-CM

## 2018-06-10 DIAGNOSIS — I5022 Chronic systolic (congestive) heart failure: Secondary | ICD-10-CM | POA: Diagnosis present

## 2018-06-10 DIAGNOSIS — R402212 Coma scale, best verbal response, none, at arrival to emergency department: Secondary | ICD-10-CM | POA: Diagnosis present

## 2018-06-10 DIAGNOSIS — R509 Fever, unspecified: Secondary | ICD-10-CM | POA: Diagnosis not present

## 2018-06-10 DIAGNOSIS — E875 Hyperkalemia: Secondary | ICD-10-CM | POA: Diagnosis not present

## 2018-06-10 DIAGNOSIS — K219 Gastro-esophageal reflux disease without esophagitis: Secondary | ICD-10-CM | POA: Diagnosis present

## 2018-06-10 DIAGNOSIS — R569 Unspecified convulsions: Secondary | ICD-10-CM

## 2018-06-10 DIAGNOSIS — A419 Sepsis, unspecified organism: Secondary | ICD-10-CM | POA: Diagnosis not present

## 2018-06-10 DIAGNOSIS — Z906 Acquired absence of other parts of urinary tract: Secondary | ICD-10-CM

## 2018-06-10 DIAGNOSIS — Z8674 Personal history of sudden cardiac arrest: Secondary | ICD-10-CM | POA: Diagnosis not present

## 2018-06-10 DIAGNOSIS — Z87891 Personal history of nicotine dependence: Secondary | ICD-10-CM

## 2018-06-10 DIAGNOSIS — L89103 Pressure ulcer of unspecified part of back, stage 3: Secondary | ICD-10-CM | POA: Diagnosis present

## 2018-06-10 DIAGNOSIS — Z931 Gastrostomy status: Secondary | ICD-10-CM

## 2018-06-10 DIAGNOSIS — R402112 Coma scale, eyes open, never, at arrival to emergency department: Secondary | ICD-10-CM | POA: Diagnosis present

## 2018-06-10 DIAGNOSIS — I252 Old myocardial infarction: Secondary | ICD-10-CM

## 2018-06-10 DIAGNOSIS — Z823 Family history of stroke: Secondary | ICD-10-CM

## 2018-06-10 DIAGNOSIS — R402352 Coma scale, best motor response, localizes pain, at arrival to emergency department: Secondary | ICD-10-CM | POA: Diagnosis present

## 2018-06-10 DIAGNOSIS — Z8619 Personal history of other infectious and parasitic diseases: Secondary | ICD-10-CM

## 2018-06-10 DIAGNOSIS — L8990 Pressure ulcer of unspecified site, unspecified stage: Secondary | ICD-10-CM | POA: Diagnosis present

## 2018-06-10 DIAGNOSIS — R000 Tachycardia, unspecified: Secondary | ICD-10-CM | POA: Diagnosis not present

## 2018-06-10 DIAGNOSIS — I69354 Hemiplegia and hemiparesis following cerebral infarction affecting left non-dominant side: Secondary | ICD-10-CM | POA: Diagnosis not present

## 2018-06-10 DIAGNOSIS — F419 Anxiety disorder, unspecified: Secondary | ICD-10-CM | POA: Diagnosis present

## 2018-06-10 DIAGNOSIS — M436 Torticollis: Secondary | ICD-10-CM | POA: Diagnosis present

## 2018-06-10 DIAGNOSIS — Z955 Presence of coronary angioplasty implant and graft: Secondary | ICD-10-CM

## 2018-06-10 DIAGNOSIS — G40909 Epilepsy, unspecified, not intractable, without status epilepticus: Secondary | ICD-10-CM | POA: Diagnosis present

## 2018-06-10 DIAGNOSIS — Z8551 Personal history of malignant neoplasm of bladder: Secondary | ICD-10-CM

## 2018-06-10 DIAGNOSIS — Z79899 Other long term (current) drug therapy: Secondary | ICD-10-CM

## 2018-06-10 DIAGNOSIS — Z87442 Personal history of urinary calculi: Secondary | ICD-10-CM

## 2018-06-10 DIAGNOSIS — Z8701 Personal history of pneumonia (recurrent): Secondary | ICD-10-CM

## 2018-06-10 DIAGNOSIS — G9340 Encephalopathy, unspecified: Secondary | ICD-10-CM | POA: Diagnosis not present

## 2018-06-10 DIAGNOSIS — R739 Hyperglycemia, unspecified: Secondary | ICD-10-CM | POA: Diagnosis present

## 2018-06-10 DIAGNOSIS — M6249 Contracture of muscle, multiple sites: Secondary | ICD-10-CM | POA: Diagnosis present

## 2018-06-10 DIAGNOSIS — E860 Dehydration: Secondary | ICD-10-CM | POA: Diagnosis not present

## 2018-06-10 DIAGNOSIS — I2510 Atherosclerotic heart disease of native coronary artery without angina pectoris: Secondary | ICD-10-CM | POA: Diagnosis present

## 2018-06-10 LAB — RESPIRATORY PANEL BY PCR
Adenovirus: NOT DETECTED
Bordetella pertussis: NOT DETECTED
Chlamydophila pneumoniae: NOT DETECTED
Coronavirus 229E: NOT DETECTED
Coronavirus HKU1: NOT DETECTED
Coronavirus NL63: NOT DETECTED
Coronavirus OC43: NOT DETECTED
Influenza A: NOT DETECTED
Influenza B: NOT DETECTED
Metapneumovirus: NOT DETECTED
Mycoplasma pneumoniae: NOT DETECTED
PARAINFLUENZA VIRUS 3-RVPPCR: NOT DETECTED
Parainfluenza Virus 1: NOT DETECTED
Parainfluenza Virus 2: NOT DETECTED
Parainfluenza Virus 4: NOT DETECTED
Respiratory Syncytial Virus: NOT DETECTED
Rhinovirus / Enterovirus: NOT DETECTED

## 2018-06-10 LAB — POCT I-STAT 7, (LYTES, BLD GAS, ICA,H+H)
Acid-Base Excess: 6 mmol/L — ABNORMAL HIGH (ref 0.0–2.0)
Bicarbonate: 29.4 mmol/L — ABNORMAL HIGH (ref 20.0–28.0)
Calcium, Ion: 1.36 mmol/L (ref 1.15–1.40)
HEMATOCRIT: 31 % — AB (ref 36.0–46.0)
Hemoglobin: 10.5 g/dL — ABNORMAL LOW (ref 12.0–15.0)
O2 Saturation: 96 %
Patient temperature: 103.3
Potassium: 4.5 mmol/L (ref 3.5–5.1)
Sodium: 145 mmol/L (ref 135–145)
TCO2: 31 mmol/L (ref 22–32)
pCO2 arterial: 42.5 mmHg (ref 32.0–48.0)
pH, Arterial: 7.458 — ABNORMAL HIGH (ref 7.350–7.450)
pO2, Arterial: 92 mmHg (ref 83.0–108.0)

## 2018-06-10 LAB — GLUCOSE, CAPILLARY
Glucose-Capillary: 107 mg/dL — ABNORMAL HIGH (ref 70–99)
Glucose-Capillary: 116 mg/dL — ABNORMAL HIGH (ref 70–99)

## 2018-06-10 LAB — COMPREHENSIVE METABOLIC PANEL
ALT: 17 U/L (ref 0–44)
AST: 21 U/L (ref 15–41)
Albumin: 2.4 g/dL — ABNORMAL LOW (ref 3.5–5.0)
Alkaline Phosphatase: 72 U/L (ref 38–126)
Anion gap: 11 (ref 5–15)
BUN: 54 mg/dL — AB (ref 8–23)
CO2: 26 mmol/L (ref 22–32)
Calcium: 10 mg/dL (ref 8.9–10.3)
Chloride: 108 mmol/L (ref 98–111)
Creatinine, Ser: 0.57 mg/dL (ref 0.44–1.00)
GFR calc Af Amer: >60 mL/min (ref >60)
GFR calc non Af Amer: >60 mL/min (ref >60)
Glucose, Bld: 190 mg/dL — ABNORMAL HIGH (ref 70–99)
Potassium: 4.6 mmol/L (ref 3.5–5.1)
Sodium: 145 mmol/L (ref 135–145)
Total Bilirubin: 0.5 mg/dL (ref 0.3–1.2)
Total Protein: 6.6 g/dL (ref 6.5–8.1)

## 2018-06-10 LAB — URINALYSIS, ROUTINE W REFLEX MICROSCOPIC
Bilirubin Urine: NEGATIVE
Glucose, UA: NEGATIVE mg/dL
Ketones, ur: NEGATIVE mg/dL
Nitrite: POSITIVE — AB
PH: 5 (ref 5.0–8.0)
Protein, ur: NEGATIVE mg/dL
Specific Gravity, Urine: 1.012 (ref 1.005–1.030)
WBC, UA: >50 WBC/hpf — ABNORMAL HIGH (ref 0–5)

## 2018-06-10 LAB — CBC WITH DIFFERENTIAL/PLATELET
Abs Immature Granulocytes: 0.1 10*3/uL — ABNORMAL HIGH (ref 0.00–0.07)
Basophils Absolute: 0.1 10*3/uL (ref 0.0–0.1)
Basophils Relative: 0 %
Eosinophils Absolute: 0.2 10*3/uL (ref 0.0–0.5)
Eosinophils Relative: 2 %
HEMATOCRIT: 36.8 % (ref 36.0–46.0)
Hemoglobin: 9.5 g/dL — ABNORMAL LOW (ref 12.0–15.0)
Immature Granulocytes: 1 %
LYMPHS ABS: 0.7 10*3/uL (ref 0.7–4.0)
Lymphocytes Relative: 4 %
MCH: 24.9 pg — ABNORMAL LOW (ref 26.0–34.0)
MCHC: 25.8 g/dL — ABNORMAL LOW (ref 30.0–36.0)
MCV: 96.6 fL (ref 80.0–100.0)
Monocytes Absolute: 0.7 10*3/uL (ref 0.1–1.0)
Monocytes Relative: 4 %
Neutro Abs: 14.8 10*3/uL — ABNORMAL HIGH (ref 1.7–7.7)
Neutrophils Relative %: 89 %
Platelets: 365 10*3/uL (ref 150–400)
RBC: 3.81 MIL/uL — ABNORMAL LOW (ref 3.87–5.11)
RDW: 17.2 % — ABNORMAL HIGH (ref 11.5–15.5)
WBC: 16.5 10*3/uL — ABNORMAL HIGH (ref 4.0–10.5)
nRBC: 0.2 % (ref 0.0–0.2)

## 2018-06-10 LAB — LACTIC ACID, PLASMA
Lactic Acid, Venous: 2.7 mmol/L (ref 0.5–1.9)
Lactic Acid, Venous: 2.8 mmol/L (ref 0.5–1.9)
Lactic Acid, Venous: 1.6 mmol/L (ref 0.5–1.9)
Lactic Acid, Venous: 1.7 mmol/L (ref 0.5–1.9)

## 2018-06-10 LAB — PROCALCITONIN: Procalcitonin: <0.1 ng/mL

## 2018-06-10 LAB — MRSA PCR SCREENING: MRSA by PCR: NEGATIVE

## 2018-06-10 MED ORDER — SODIUM CHLORIDE 0.9 % IV SOLN
2.0000 g | Freq: Two times a day (BID) | INTRAVENOUS | Status: DC
  Filled 2018-06-10: qty 2

## 2018-06-10 MED ORDER — DIAZEPAM 5 MG PO TABS
2.5000 mg | ORAL_TABLET | Freq: Two times a day (BID) | ORAL | Status: DC
  Administered 2018-06-10 – 2018-06-26 (×32): 2.5 mg
  Filled 2018-06-10 (×32): qty 1

## 2018-06-10 MED ORDER — PRO-STAT SUGAR FREE PO LIQD
30.0000 mL | Freq: Two times a day (BID) | ORAL | Status: DC
  Administered 2018-06-10 – 2018-06-12 (×4): 30 mL
  Filled 2018-06-10 (×4): qty 30

## 2018-06-10 MED ORDER — ONDANSETRON HCL 4 MG/2ML IJ SOLN: 4.0000 mg | Freq: Four times a day (QID) | INTRAMUSCULAR | Status: DC | PRN

## 2018-06-10 MED ORDER — FAMOTIDINE 20 MG PO TABS
20.0000 mg | ORAL_TABLET | Freq: Every day | ORAL | Status: DC
  Administered 2018-06-10 – 2018-06-25 (×16): 20 mg
  Filled 2018-06-10 (×17): qty 1

## 2018-06-10 MED ORDER — POLYETHYLENE GLYCOL 3350 17 G PO PACK
17.0000 g | PACK | Freq: Every day | ORAL | Status: DC | PRN
  Filled 2018-06-10: qty 1

## 2018-06-10 MED ORDER — INSULIN ASPART 100 UNIT/ML ~~LOC~~ SOLN
0.0000 [IU] | SUBCUTANEOUS | Status: DC
  Administered 2018-06-12 – 2018-06-17 (×3): 1 [IU] via SUBCUTANEOUS
  Administered 2018-06-20: 2 [IU] via SUBCUTANEOUS

## 2018-06-10 MED ORDER — IPRATROPIUM-ALBUTEROL 0.5-2.5 (3) MG/3ML IN SOLN: 3.0000 mL | Freq: Four times a day (QID) | RESPIRATORY_TRACT | Status: DC | PRN

## 2018-06-10 MED ORDER — SODIUM CHLORIDE 0.9 % IV BOLUS
1000.0000 mL | Freq: Once | INTRAVENOUS | Status: AC
  Administered 2018-06-10: 1000 mL via INTRAVENOUS

## 2018-06-10 MED ORDER — ONDANSETRON HCL 4 MG PO TABS: 4.0000 mg | ORAL_TABLET | Freq: Four times a day (QID) | ORAL | Status: DC | PRN

## 2018-06-10 MED ORDER — DOCUSATE SODIUM 50 MG/5ML PO LIQD
100.0000 mg | Freq: Two times a day (BID) | ORAL | Status: DC
  Administered 2018-06-10: 100 mg
  Filled 2018-06-10: qty 10

## 2018-06-10 MED ORDER — VALPROIC ACID 250 MG/5ML PO SOLN
250.0000 mg | Freq: Two times a day (BID) | ORAL | Status: DC
  Administered 2018-06-10 – 2018-06-26 (×32): 250 mg
  Filled 2018-06-10 (×33): qty 5

## 2018-06-10 MED ORDER — CHLORHEXIDINE GLUCONATE CLOTH 2 % EX PADS
6.0000 | MEDICATED_PAD | Freq: Every day | CUTANEOUS | Status: DC
  Administered 2018-06-10 – 2018-06-25 (×15): 6 via TOPICAL

## 2018-06-10 MED ORDER — SODIUM CHLORIDE 0.9 % IV SOLN
1.0000 g | Freq: Three times a day (TID) | INTRAVENOUS | Status: AC
  Administered 2018-06-10 – 2018-06-16 (×19): 1 g via INTRAVENOUS
  Filled 2018-06-10 (×19): qty 1

## 2018-06-10 MED ORDER — LEVETIRACETAM 100 MG/ML PO SOLN
1000.0000 mg | Freq: Two times a day (BID) | ORAL | Status: DC
  Administered 2018-06-10 – 2018-06-26 (×32): 1000 mg
  Filled 2018-06-10 (×32): qty 10

## 2018-06-10 MED ORDER — ACETAMINOPHEN 325 MG PO TABS: 650.0000 mg | ORAL_TABLET | Freq: Four times a day (QID) | ORAL | Status: DC | PRN

## 2018-06-10 MED ORDER — SODIUM CHLORIDE 0.9 % IV SOLN
INTRAVENOUS | Status: DC
  Administered 2018-06-10: 500 mL via INTRAVENOUS

## 2018-06-10 MED ORDER — LACTATED RINGERS IV SOLN
INTRAVENOUS | Status: DC
  Administered 2018-06-10: 900 mL via INTRAVENOUS
  Administered 2018-06-11: 03:00:00 via INTRAVENOUS

## 2018-06-10 MED ORDER — SODIUM CHLORIDE 0.9% FLUSH
3.0000 mL | Freq: Two times a day (BID) | INTRAVENOUS | Status: DC
  Administered 2018-06-10 – 2018-06-26 (×25): 3 mL via INTRAVENOUS

## 2018-06-10 MED ORDER — ACETAMINOPHEN 650 MG RE SUPP: 650.0000 mg | Freq: Four times a day (QID) | RECTAL | Status: DC | PRN

## 2018-06-10 MED ORDER — ACETAMINOPHEN 160 MG/5ML PO SOLN
1000.0000 mg | Freq: Once | ORAL | Status: AC
  Administered 2018-06-10: 1000 mg via ORAL
  Filled 2018-06-10: qty 40.6

## 2018-06-10 MED ORDER — VANCOMYCIN HCL 10 G IV SOLR
1250.0000 mg | INTRAVENOUS | Status: DC
  Filled 2018-06-10: qty 1250

## 2018-06-10 MED ORDER — ADULT MULTIVITAMIN LIQUID CH
15.0000 mL | Freq: Every day | ORAL | Status: DC
  Administered 2018-06-10 – 2018-06-26 (×17): 15 mL
  Filled 2018-06-10 (×17): qty 15

## 2018-06-10 MED ORDER — ALTEPLASE 2 MG IJ SOLR
2.0000 mg | Freq: Once | INTRAMUSCULAR | Status: AC
  Administered 2018-06-10: 2 mg

## 2018-06-10 MED ORDER — SODIUM CHLORIDE 0.9% FLUSH
10.0000 mL | INTRAVENOUS | Status: DC | PRN
  Administered 2018-06-15: 10 mL
  Filled 2018-06-10: qty 40

## 2018-06-10 MED ORDER — DOCUSATE SODIUM 100 MG PO CAPS
100.0000 mg | ORAL_CAPSULE | Freq: Two times a day (BID) | ORAL | Status: DC
  Filled 2018-06-10 (×2): qty 1

## 2018-06-10 MED ORDER — CARVEDILOL 6.25 MG PO TABS
6.2500 mg | ORAL_TABLET | Freq: Two times a day (BID) | ORAL | Status: DC
  Administered 2018-06-10 – 2018-06-26 (×30): 6.25 mg
  Filled 2018-06-10 (×16): qty 1
  Filled 2018-06-10: qty 0.5
  Filled 2018-06-10 (×2): qty 1
  Filled 2018-06-10: qty 0.5
  Filled 2018-06-10 (×6): qty 1
  Filled 2018-06-10: qty 0.5
  Filled 2018-06-10 (×7): qty 1

## 2018-06-10 MED ORDER — APIXABAN 2.5 MG PO TABS
2.5000 mg | ORAL_TABLET | Freq: Two times a day (BID) | ORAL | Status: DC
  Administered 2018-06-10 – 2018-06-23 (×26): 2.5 mg
  Filled 2018-06-10 (×26): qty 1

## 2018-06-10 MED ORDER — CHLORHEXIDINE GLUCONATE 0.12 % MT SOLN
15.0000 mL | Freq: Two times a day (BID) | OROMUCOSAL | Status: DC
  Administered 2018-06-10: 15 mL via OROMUCOSAL

## 2018-06-10 MED ORDER — VITAL HIGH PROTEIN PO LIQD
1000.0000 mL | ORAL | Status: DC
  Administered 2018-06-10 – 2018-06-13 (×4): 1000 mL

## 2018-06-10 MED ORDER — ORAL CARE MOUTH RINSE
15.0000 mL | OROMUCOSAL | Status: DC
  Administered 2018-06-10 – 2018-06-26 (×148): 15 mL via OROMUCOSAL

## 2018-06-10 MED ORDER — SODIUM CHLORIDE 0.9 % IV SOLN
2.0000 g | Freq: Once | INTRAVENOUS | Status: AC
  Administered 2018-06-10: 2 g via INTRAVENOUS
  Filled 2018-06-10: qty 2

## 2018-06-10 MED ORDER — VANCOMYCIN HCL IN DEXTROSE 1-5 GM/200ML-% IV SOLN
1000.0000 mg | Freq: Once | INTRAVENOUS | Status: AC
  Administered 2018-06-10: 1000 mg via INTRAVENOUS
  Filled 2018-06-10: qty 200

## 2018-06-10 MED ORDER — CHLORHEXIDINE GLUCONATE 0.12% ORAL RINSE (MEDLINE KIT)
15.0000 mL | Freq: Two times a day (BID) | OROMUCOSAL | Status: DC
  Administered 2018-06-10 – 2018-06-26 (×32): 15 mL via OROMUCOSAL

## 2018-06-10 MED ORDER — VANCOMYCIN HCL 500 MG IV SOLR
500.0000 mg | INTRAVENOUS | Status: AC
  Administered 2018-06-10: 500 mg via INTRAVENOUS
  Filled 2018-06-10: qty 500

## 2018-06-10 MED ORDER — JUVEN PO PACK
1.0000 | PACK | Freq: Two times a day (BID) | ORAL | Status: DC
  Administered 2018-06-10 – 2018-06-26 (×32): 1
  Filled 2018-06-10 (×34): qty 1

## 2018-06-10 MED ORDER — ISOSOURCE 1.5 CAL PO LIQD: 230.0000 mL | ORAL | Status: DC

## 2018-06-10 NOTE — Progress Notes (Signed)
Received patient from EMS. Placed on vent setting patient uses at [facility].

## 2018-06-10 NOTE — Progress Notes (Signed)
eLink Physician-Brief Progress Note Patient Name: Melissa Cochran DOB: [birth year] MRN: [id number]   Date of Service  06/10/2018  HPI/Events of Note  Hyperglycemia - Blood glucose = 93 prior to Vital HP enteral nutrition starting.   eICU Interventions  Will order: 1. Q 4 hour sensitive Novolog SSI.      Intervention Category Major Interventions: Hyperglycemia - active titration of insulin therapy  Lysle Dingwall 06/10/2018, 7:51 PM

## 2018-06-10 NOTE — ED Triage Notes (Signed)
Pt arrives via [facility] from [facility] with reports of fever of 101 and HR from 140s-160s. Pt has peg tube, foley, PICC and trach vent

## 2018-06-10 NOTE — Progress Notes (Signed)
Pharmacy Antibiotic Note  Melissa Cochran is a 83 y.o. female admitted from [place] on 06/10/2018 with sepsis.  Pharmacy has been consulted for vancomycin and cefepime dosing for HCAP. Tm 103.3, WBC elevated, and SCr at baseline. Cefepime 2 g IV given at 11:56 and vancomycin 1000 mg given at 12:04.   Vancomycin 1250 mg IV q24h. Goal AUC 400-550. Expected AUC: 504.9 SCr used: 0.8  Plan: Vancomycin 500 mg IV now (to complete 1500 mg load) Vancomycin 1250 mg IV q24h Cefepime 2 g IV q12h Monitor renal function, clinical progress, and C/S  Height: 5\' 4"  (162.6 cm) Weight: 170 lb (77.1 kg) IBW/kg (Calculated) : 54.7  Temp (24hrs), Avg:102.7 F (39.3 C), Min:102.1 F (38.9 C), Max:103.3 F (39.6 C)  Recent Labs  Lab 06/10/18 1141 06/10/18 1145  WBC  --  16.5*  CREATININE  --  0.57  LATICACIDVEN 2.7*  --     Estimated Creatinine Clearance: 56.4 mL/min (by C-G formula based on SCr of 0.57 mg/dL).    No Known Allergies  Antimicrobials this admission: vancomycin 2/1 >>  cefepime 2/1 >>   Dose adjustments this admission:   Microbiology results: 2/1 BCx:  2/1 UCx:  2/1 Respiratory PCR:   Thank you for allowing pharmacy to be a part of this patient's care.  Renold Genta, PharmD, BCPS Clinical Pharmacist Clinical phone for 06/10/2018 until 3p is [phone] 06/10/2018 1:31 PM  **Pharmacist phone directory can now be found on [email or web address] listed under [place]**

## 2018-06-10 NOTE — H&P (Signed)
History and Physical    Melissa Cochran MGQ:[id number] DOB: Apr 12, 1936 DOA: 06/10/2018  PCP: [name], MD  Patient coming from: [facility]   Chief Complaint: Fever  HPI: Melissa Cochran is a 83 y.o. female with medical history significant of chronic atrial fibrillation on apixaban, hypertension, coronary artery disease status post stent in 2671, chronic systolic heart failure with EF of 45 to 50% by echo on 06/28/2017, nephrolithiasis complicated by hydronephrosis status post stent placement in 2017, history of bilateral hydronephrosis, CVA, seizure disorder complicated by chronic respiratory failure with ventilator dependence and PEG tube placement presenting from [facility] for fever.  Her baseline is able to open eyes - and she may be at baseline at this time.   She was admitted from [place] from 12/10-24 for acute encephalopathy possibly related to seizures.  She may have also had PNA.  She had C diff colitis, started on PO Vanc.  ED Course: PCCM recommends SDU due to on chronic vent settings.  They will consult.  Baseline on good days she opens her eyes without communication, has trach/PEG.  Sacral ulcer with purulence in the middle; urine may be source.  CXR ok and on home vent settings.  Given Vanc/Cefepime.  Given 1L IVF, was in afib with RVR but improved.  Review of Systems: unable to perform   Ambulatory Status:   Nonambulatory  PMH, PSH, SH, and FH reviewed in Epic  Past Medical History:  Diagnosis Date  . Adnexal mass   . Anxiety   . Arthritis   . Bladder tumor   . Bleeding ulcer   . CHF (congestive heart failure) ([place])   . CHF (congestive heart failure) ([place])   . Chronic bronchitis ([place])   . Chronic respiratory failure ([place]) 04/19/2018  . COPD (chronic obstructive pulmonary disease) ([place])   . Coronary artery disease   . CVA (cerebral vascular accident) ([place]) 04/19/2018  . DVT (deep venous thrombosis) ([facility])    RLE  . GERD (gastroesophageal reflux  disease)   . Heart block    left  . History of bleeding ulcers   . History of kidney stones   . Hyperlipemia   . Hypertension   . Myocardial infarction ([place])    "slight one" (07/05/2017)  . Pneumonia    "several times" (07/05/2017)  . Seizures ([place]) 02/2017 "several"; 06/26/2017 X 1  . Shortness of breath dyspnea   . Stroke [facility]) 02/13/2018   "still right sided weakness" (07/05/2017)    Past Surgical History:  Procedure Laterality Date  . ABDOMINAL HYSTERECTOMY    . CARDIAC CATHETERIZATION Left 10/21/2015   Procedure: Left Heart Cath and Coronary Angiography;  Surgeon: Isaias Cowman, MD;  Location: [place] [facility];  Service: Cardiovascular;  Laterality: Left;  . CARDIAC CATHETERIZATION N/A 10/21/2015   Procedure: Coronary Stent Intervention;  Surgeon: Isaias Cowman, MD;  Location: [place] [facility];  Service: Cardiovascular;  Laterality: N/A;  . CARDIAC CATHETERIZATION    . CYSTOSCOPY W/ RETROGRADES Bilateral 01/20/2016   Procedure: CYSTOSCOPY WITH RETROGRADE PYELOGRAM;  Surgeon: Hollice Espy, MD;  Location: [facility];  Service: Urology;  Laterality: Bilateral;  . CYSTOSCOPY WITH STENT PLACEMENT Right 01/20/2016   Procedure: CYSTOSCOPY WITH STENT PLACEMENT;  Surgeon: Hollice Espy, MD;  Location: [facility];  Service: Urology;  Laterality: Right;  . HIP FRACTURE SURGERY Left   . STOMACH SURGERY     "stomach ulcers"  . TRACHEOSTOMY TUBE PLACEMENT N/A 03/02/2017   Procedure: TRACHEOSTOMY;  Surgeon: Rozetta Nunnery, MD;  Location: [facility];  Service: ENT;  Laterality: N/A;  . TRANSURETHRAL RESECTION OF BLADDER TUMOR N/A 01/20/2016   Procedure: TRANSURETHRAL RESECTION OF BLADDER TUMOR (TURBT);  Surgeon: Hollice Espy, MD;  Location: [facility];  Service: Urology;  Laterality: N/A;    Social History   Socioeconomic History  . Marital status: Widowed    Spouse name: Not on file  . Number of children: Not on file  . Years of education: Not on file  . Highest  education level: Not on file  Occupational History  . Not on file  Social Needs  . Financial resource strain: Not on file  . Food insecurity:    Worry: Not on file    Inability: Not on file  . Transportation needs:    Medical: Not on file    Non-medical: Not on file  Tobacco Use  . Smoking status: Former Smoker    Packs/day: 1.00    Years: 40.00    Pack years: 40.00    Last attempt to quit: 06/13/1998    Years since quitting: 20.0  . Smokeless tobacco: Never Used  Substance and Sexual Activity  . Alcohol use: No  . Drug use: No  . Sexual activity: Not Currently  Lifestyle  . Physical activity:    Days per week: Not on file    Minutes per session: Not on file  . Stress: Not on file  Relationships  . Social connections:    Talks on phone: Not on file    Gets together: Not on file    Attends religious service: Not on file    Active member of club or organization: Not on file    Attends meetings of clubs or organizations: Not on file    Relationship status: Not on file  . Intimate partner violence:    Fear of current or ex partner: Not on file    Emotionally abused: Not on file    Physically abused: Not on file    Forced sexual activity: Not on file  Other Topics Concern  . Not on file  Social History Narrative  . Not on file    No Known Allergies  Family History  Problem Relation Age of Onset  . Stomach cancer Mother   . CVA Father   . Bladder Cancer Neg Hx   . Kidney cancer Neg Hx     Prior to Admission medications   Medication Sig Start Date End Date Taking? Authorizing Provider  acetaminophen (TYLENOL) 325 MG tablet Take 2 tablets (650 mg total) by mouth every 6 (six) hours as needed for mild pain (or Fever >/= 101). 07/08/17   Velvet Bathe, MD  Amino Acids-Protein Hydrolys (FEEDING SUPPLEMENT, PRO-STAT SUGAR FREE 64,) LIQD Place 30 mLs into feeding tube 2 (two) times daily. 05/01/18   Georgette Shell, MD  apixaban (ELIQUIS) 2.5 MG TABS tablet Place 2.5  mg into feeding tube 2 (two) times daily.    [provider]  aspirin 81 MG chewable tablet Chew 81 mg by mouth daily.    [provider]  carvedilol (COREG) 6.25 MG tablet Take 1 tablet (6.25 mg total) by mouth 2 (two) times daily with a meal. 07/08/17   Velvet Bathe, MD  chlorhexidine (PERIDEX) 0.12 % solution Use as directed 15 mLs in the mouth or throat 2 (two) times daily.    [provider]  diazepam (VALIUM) 5 MG tablet Take 2.5 mg by mouth every 12 (twelve) hours.    [provider]  famotidine (PEPCID) 20 MG tablet Take 20 mg by mouth at bedtime.    [provider]  furosemide (LASIX) 40 MG tablet Take 1 tablet (40 mg total) by mouth daily. 07/08/17   Velvet Bathe, MD  ipratropium-albuterol (DUONEB) 0.5-2.5 (3) MG/3ML SOLN Take 3 mLs by nebulization every 6 (six) hours as needed (SOB).    [provider]  levETIRAcetam (KEPPRA) 100 MG/ML solution Take 10 mLs (1,000 mg total) by mouth 2 (two) times daily. 07/07/17   Velvet Bathe, MD  Multiple Vitamin (MULTIVITAMIN) LIQD Place 15 mLs into feeding tube daily. 05/01/18   Georgette Shell, MD  nutrition supplement, JUVEN, Fanny Dance) PACK Place 1 packet into feeding tube 2 (two) times daily between meals. 05/01/18   Georgette Shell, MD  Nutritional Supplements (FEEDING SUPPLEMENT, VITAL HIGH PROTEIN,) LIQD liquid Place 1,000 mLs into feeding tube daily. 05/01/18   Georgette Shell, MD  Nutritional Supplements (ISOSOURCE 1.5 CAL) LIQD 230 mLs by Gastric Tube route every 4 (four) hours.    [provider]  Valproate Sodium (DEPAKENE) 250 MG/5ML SOLN solution Take 10 mLs (500 mg total) by mouth 2 (two) times daily. Patient taking differently: Take 250 mg by mouth 2 (two) times daily.  07/08/17   Velvet Bathe, MD  vancomycin (VANCOCIN) 50 mg/mL oral solution Take 2.5 mLs (125 mg total) by mouth 4 (four) times daily. Continue till 05/05/2018. 05/01/18   Georgette Shell, MD     Physical Exam: Vitals:   06/10/18 1500 06/10/18 1515 06/10/18 1531 06/10/18 1600  BP: 95/61 (!) 98/53 (!) 90/56 (!) 96/57  Pulse: (!) 108 (!) 103 (!) 102 (!) 103  Resp: (!) 22 17 15 15   Temp:      TempSrc:      SpO2: 98% 97% 96% 98%  Weight:      Height:         . General: Obese, with trach/peg; able to open eyes but no response otherwise . Eyes:  PERRL, normal lids, iris . ENT: trach in place, on vent . Cardiovascular:  Irregularly irregular with tachycardia, no m/r/g. No LE edema.  Marland Kitchen Respiratory:  Coarse breath sounds diffusely.  Mildly increased respiratory effort on vent. . Abdomen:  soft, NT, ND, NABS; G tube C/D/I . Skin: stage 4 sacral pressure ulcer     . Musculoskeletal: atrophic with increased tone . Psychiatric: chronically unresponsive/vegetative . Neurologic: unable to perform    Radiological Exams on Admission: Dg Chest Portable 1 View  Result Date: 06/10/2018 CLINICAL DATA:  Fever and tachycardia today. EXAM: PORTABLE CHEST 1 VIEW COMPARISON:  Single-view of the chest 04/23/2018, 04/19/2018, 08/24/2017 and 06/28/2017. FINDINGS: Tracheostomy tube and right PICC are in place. Left basilar atelectasis is seen. The right lung is clear. There is cardiomegaly. Aortic atherosclerosis is identified. No pneumothorax or pleural effusion. IMPRESSION: No acute disease. Electronically Signed   By: Inge Rise M.D.   On: 06/10/2018 12:12    EKG: Independently reviewed.  Afib with rate 141; nonspecific ST changes with no evidence of acute ischemia   Labs on Admission: I have personally reviewed the available labs and imaging studies at the time of the admission.  Pertinent labs:   ABG: 7.458/42.5/92.0 Glucose 190 BUN 54/Creatinine 0.57/GFR >60 - stable Lactate 2.7, 2.8 WBC 16.5 Hgb 9.5 - stable UA: small Hgb, large LE, + nitrite, few bacteria, >50 WBC  Assessment/Plan Principal Problem:   Sepsis ([place]) Active Problems:   Essential (primary)  hypertension   Pressure injury of skin  Seizures (HCC)   A-fib (HCC)   Chronic respiratory failure (HCC)   Sepsis -SIRS criteria in this patient includes: Leukocytosis, fever, tachycardia, tachypnea  -Patient has evidence of acute organ failure with elevated lactate and borderline hypotension -While awaiting blood cultures, this appears to be a preseptic condition. -Sepsis protocol initiated -Patient had initial SBP <90/MAP <65 and so has received the 30 cc/kg IVF bolus. -Suspected source is unknown, possibly her sacral ulcer vs. UTI with chronic indwelling foley; she had ESBL E coli UTI in 2/19 -Tracheal aspirate with Pseudomonas on 12/13, sensitive to Ceftazidime and Zosyn -She did have recent C diff colitis but no apparent c/o diarrhea at this time -Blood and urine cultures pending -Will admit to SDU and continue to monitor -Will admit due to: hemodynamic instability; AMS that is severe or persistent; strict isolation -Treat with IV Cefepime/Vanc for undifferentiated sepsis -Will trend lactate to ensure improvement -Will order lower respiratory tract procalcitonin level.  Antibiotics would not be indicated for PCT <0.1 and probably should not be used for < 0.25.  >0.5 indicates infection and >>0.5 indicates more serious disease.  As the procalcitonin level normalizes, it will be reasonable to consider de-escalation of antibiotic coverage.  Chronic respiratory failure -Continue vent management -Consider ethics committee consult, as palliative care has been involved in this patient with chronic vegetative state without success  Afib -Rate control with Coreg; will continue but need to carefully monitor with hypotension -No AC  Seizures -Continue Keppra, Depakene  Sacral pressure ulcer -Wound care -Antibiotics as above  HTN -Continue Coreg   DVT prophylaxis:  Lovenox Code Status:  Full - confirmed with family during prior hospitalization Family Communication: None  present Disposition Plan:  Back to LTAC once clinically improved Consults called: PCCM Admission status: Admit - It is my clinical opinion that admission to [facility] is reasonable and necessary because of the expectation that this patient will require hospital care that crosses at least 2 midnights to treat this condition based on the medical complexity of the problems presented.  Given the aforementioned information, the predictability of an adverse outcome is felt to be significant.    Karmen Bongo MD [facility]   How to contact the [facility] Attending or Consulting provider [place] or covering provider during after hours [place], for this patient?  1. Check the care team in [facility] and look for a) attending/consulting [facility] provider listed and b) the [facility] team listed 2. Log into [email or web address] and use [place]'s universal password to access. If you do not have the password, please contact the hospital operator. 3. Locate the [facility] provider you are looking for under [facility] and page to a number that you can be directly reached. 4. If you still have difficulty reaching the provider, please page the [facility] (Director on Call) for the Hospitalists listed on amion for assistance.   06/10/2018, 5:42 PM

## 2018-06-10 NOTE — Consult Note (Signed)
NAME:  Melissa Cochran, MRN:  [id number], DOB:  1935/10/27, LOS: 0 ADMISSION DATE:  06/10/2018, CONSULTATION DATE: 06/10/2018 REFERRING MD: Dr. Ronnald Nian, CHIEF COMPLAINT: Fever  Brief History   Patient is unresponsive, chronic trach from [facility].  All history was obtained from the patient's medical record.  I attempted to call the patient's family at the numbers provided within the chart and there was no answer.  83 year old female history of stroke status post tracheostomy, PEG tube, chronically bedbound patient of [facility] nursing [facility].  Patient with chronic indwelling Foley catheter presented to the hospital tachycardic and febrile.  EKG initially revealed H fibrillation with RVR she has a known sacral wound.  Patient was placed on IV antibiotics vancomycin and cefepime.  Pulmonary was consulted for recommendations regarding chronic tracheostomy tube.  And whether or not the patient would need to be in the intensive care unit.  History of present illness   83 year old female past medical history of stroke status post tracheostomy, PEG tube, chronically bedbound admitted to from [facility] with concern of sepsis.  She was found to be febrile and tachycardic in atrial fibrillation with RVR.  She has a known sacral bed wound as well as chronic Foley.  She was given saline bolus in the ER Tylenol for fever and started on vancomycin and cefepime.  She is found to have a leukocytosis, mildly elevated lactic acid and urinalysis consistent with urinary tract infection.  Chest x-ray that was completed in the ER had no evidence of infiltrate or pneumonia.  She is currently on baseline vent settings on 21% FiO2.  Her blood pressure was stable at the time of this evaluation.  Map greater than 80.  Past Medical History   Past Medical History:  Diagnosis Date  . Adnexal mass   . Anxiety   . Arthritis   . Bladder tumor   . Bleeding ulcer   . CHF (congestive heart failure) ([place])   . CHF (congestive  heart failure) ([place])   . Chronic bronchitis ([place])   . Chronic respiratory failure ([place]) 04/19/2018  . COPD (chronic obstructive pulmonary disease) ([place])   . Coronary artery disease   . CVA (cerebral vascular accident) ([place]) 04/19/2018  . DVT (deep venous thrombosis) ([facility])    RLE  . GERD (gastroesophageal reflux disease)   . Heart block    left  . History of bleeding ulcers   . History of kidney stones   . Hyperlipemia   . Hypertension   . Myocardial infarction ([place])    "slight one" (07/05/2017)  . Pneumonia    "several times" (07/05/2017)  . Seizures ([place]) 02/2017 "several"; 06/26/2017 X 1  . Shortness of breath dyspnea   . Stroke [facility]) 02/13/2018   "still right sided weakness" (07/05/2017)     [facility] admission 05/11/2018  Consults:  Critical care  Procedures:  None  Significant Diagnostic Tests:  Lactic acid 2.8 down to 1.6  Micro Data:  04/21/2018 sputum culture: Pseudomonas aeruginosa 04/20/2018: MRSA PCR positive  06/10/2018: Blood cultures no growth to date 06/10/2018: Urine culture pending  Antimicrobials:  Cefepime Vancomycin  Interim history/subjective:  Please see HPI as above  Objective   Blood pressure (!) 96/57, pulse (!) 102, temperature (!) 102.1 F (38.9 C), temperature source Rectal, resp. rate 16, height 5\' 4"  (1.626 m), weight 77.1 kg, SpO2 96 %.    Vent Mode: PRVC FiO2 (%):  [28 %] 28 % Set Rate:  [12 bmp] 12 bmp Vt  Set:  [500 mL] 500 mL PEEP:  [5 cmH20] 5 cmH20 Plateau Pressure:  [19 cmH20] 19 cmH20   Intake/Output Summary (Last 24 hours) at 06/10/2018 1552 Last data filed at 06/10/2018 1545 Gross per 24 hour  Intake 2400 ml  Output 250 ml  Net 2150 ml   Filed Weights   06/10/18 1320  Weight: 77.1 kg    Examination: General: Chronically ill-appearing elderly female, chronic tracheostomy, PEG tube on mechanical ventilation, unresponsive to verbal stimuli HENT: NCAT, sclera clear, eyes matted shut, pupils  reactive to light Lungs: Bilateral ventilated breath sounds, no crackles, no wheeze, no rhonchi Cardiovascular: Tachycardic, regular, S1-S2, no MRG Abdomen: Soft, nontender, nondistended Extremities: Contractures of the upper and lower extremities, brace on the left forearm, bilateral foot drop Neuro: Will not respond to pain, no opening of eyes to voice or stimulation  Resolved Hospital Problem list     Assessment & Plan:   Chronic hypoxemic respiratory failure requiring tracheostomy tube and mechanical ventilatory support - Continue current vent management - No plans for change at this time - Suspect her minute ventilation is up due to ongoing sepsis syndrome  Severe sepsis secondary to presumed urinary tract infection in the setting of chronic Foley - Lactic acidosis secondary to above -Would recommend discontinuation/removal and reinsertion of Foley catheter that was placed at outside facility - Low threshold for addition of meropenem due to prior culture history from urine  - We will discuss with   History of atrial fibrillation however appears sinus on telemetry in the ED - Rate controlled with heart rate in the low 100s  History of chronic systolic heart failure -Prior echo in 2019 with EF 45 to 50% Elevated pulmonary pressures consistent with moderate pulmonary hypertension, peak PA pressures 56 mmHg -We will need to slowly reinstitute heart failure regimen and attempt to maintain euvolemia.  Sacral wound -Documented per primary - View image and H&P.  History of seizures Continue Keppra and Depakote   Labs   CBC: Recent Labs  Lab 06/10/18 1145 06/10/18 1210  WBC 16.5*  --   NEUTROABS 14.8*  --   HGB 9.5* 10.5*  HCT 36.8 31.0*  MCV 96.6  --   PLT 365  --     Basic Metabolic Panel: Recent Labs  Lab 06/10/18 1145 06/10/18 1210  NA 145 145  K 4.6 4.5  CL 108  --   CO2 26  --   GLUCOSE 190*  --   BUN 54*  --   CREATININE 0.57  --   CALCIUM 10.0  --     GFR: Estimated Creatinine Clearance: 56.4 mL/min (by C-G formula based on SCr of 0.57 mg/dL). Recent Labs  Lab 06/10/18 1141 06/10/18 1145 06/10/18 1308  WBC  --  16.5*  --   LATICACIDVEN 2.7*  --  2.8*    Liver Function Tests: Recent Labs  Lab 06/10/18 1145  AST 21  ALT 17  ALKPHOS 72  BILITOT 0.5  PROT 6.6  ALBUMIN 2.4*   No results for input(s): LIPASE, AMYLASE in the last 168 hours. No results for input(s): AMMONIA in the last 168 hours.  ABG    Component Value Date/Time   PHART 7.458 (H) 06/10/2018 1210   PCO2ART 42.5 06/10/2018 1210   PO2ART 92.0 06/10/2018 1210   HCO3 29.4 (H) 06/10/2018 1210   TCO2 31 06/10/2018 1210   ACIDBASEDEF 2.0 06/26/2017 0210   O2SAT 96.0 06/10/2018 1210     Coagulation Profile: No results for input(s):  INR, PROTIME in the last 168 hours.  Cardiac Enzymes: No results for input(s): CKTOTAL, CKMB, CKMBINDEX, TROPONINI in the last 168 hours.  HbA1C: Hgb A1c MFr Bld  Date/Time Value Ref Range Status  02/13/2017 09:35 PM 5.1 4.8 - 5.6 % Final    Comment:    (NOTE) Pre diabetes:          5.7%-6.4% Diabetes:              >6.4% Glycemic control for   <7.0% adults with diabetes     CBG: No results for input(s): GLUCAP in the last 168 hours.  Review of Systems:   Patient is unresponsive. Unable to obtain ROS.   Past Medical History  She,  has a past medical history of Adnexal mass, Anxiety, Arthritis, Bladder tumor, Bleeding ulcer, CHF (congestive heart failure) ([place]), CHF (congestive heart failure) ([place]), Chronic bronchitis ([place]), Chronic respiratory failure ([place]) (04/19/2018), COPD (chronic obstructive pulmonary disease) ([place]), Coronary artery disease, CVA (cerebral vascular accident) ([place]) (04/19/2018), DVT (deep venous thrombosis) ([place]), GERD (gastroesophageal reflux disease), Heart block, History of bleeding ulcers, History of kidney stones, Hyperlipemia, Hypertension, Myocardial infarction ([place]), Pneumonia, Seizures ([place])  (02/2017 "several"; 06/26/2017 X 1), Shortness of breath dyspnea, and Stroke ([place]) (02/13/2018).   Surgical History    Past Surgical History:  Procedure Laterality Date  . ABDOMINAL HYSTERECTOMY    . CARDIAC CATHETERIZATION Left 10/21/2015   Procedure: Left Heart Cath and Coronary Angiography;  Surgeon: Isaias Cowman, MD;  Location: [place] [facility];  Service: Cardiovascular;  Laterality: Left;  . CARDIAC CATHETERIZATION N/A 10/21/2015   Procedure: Coronary Stent Intervention;  Surgeon: Isaias Cowman, MD;  Location: [place] [facility];  Service: Cardiovascular;  Laterality: N/A;  . CARDIAC CATHETERIZATION    . CYSTOSCOPY W/ RETROGRADES Bilateral 01/20/2016   Procedure: CYSTOSCOPY WITH RETROGRADE PYELOGRAM;  Surgeon: Hollice Espy, MD;  Location: [facility];  Service: Urology;  Laterality: Bilateral;  . CYSTOSCOPY WITH STENT PLACEMENT Right 01/20/2016   Procedure: CYSTOSCOPY WITH STENT PLACEMENT;  Surgeon: Hollice Espy, MD;  Location: [facility];  Service: Urology;  Laterality: Right;  . HIP FRACTURE SURGERY Left   . STOMACH SURGERY     "stomach ulcers"  . TRACHEOSTOMY TUBE PLACEMENT N/A 03/02/2017   Procedure: TRACHEOSTOMY;  Surgeon: Rozetta Nunnery, MD;  Location: [facility];  Service: ENT;  Laterality: N/A;  . TRANSURETHRAL RESECTION OF BLADDER TUMOR N/A 01/20/2016   Procedure: TRANSURETHRAL RESECTION OF BLADDER TUMOR (TURBT);  Surgeon: Hollice Espy, MD;  Location: [facility];  Service: Urology;  Laterality: N/A;     Social History   reports that she quit smoking about 20 years ago. She has a 40.00 pack-year smoking history. She has never used smokeless tobacco. She reports that she does not drink alcohol or use drugs.   Family History   Her family history includes CVA in her father; Stomach cancer in her mother. There is no history of Bladder Cancer or Kidney cancer.   Allergies No Known Allergies   Home Medications  Prior to Admission medications   Medication Sig  Start Date End Date Taking? Authorizing Provider  acetaminophen (TYLENOL) 325 MG tablet Take 2 tablets (650 mg total) by mouth every 6 (six) hours as needed for mild pain (or Fever >/= 101). 07/08/17  Yes Velvet Bathe, MD  Amino Acids-Protein Hydrolys (FEEDING SUPPLEMENT, PRO-STAT SUGAR FREE 64,) LIQD Place 30 mLs into feeding tube 2 (two) times daily. 05/01/18  Yes Georgette Shell, MD  aspirin 81 MG chewable tablet  Chew 81 mg by mouth daily.   Yes [provider]  chlorhexidine (PERIDEX) 0.12 % solution Use as directed 15 mLs in the mouth or throat 2 (two) times daily.   Yes [provider]  diazepam (VALIUM) 5 MG tablet Take 2.5 mg by mouth every 12 (twelve) hours.   Yes [provider]  famotidine (PEPCID) 20 MG tablet Place 20 mg into feeding tube daily.    Yes [provider]  furosemide (LASIX) 40 MG tablet Take 1 tablet (40 mg total) by mouth daily. 07/08/17  Yes Velvet Bathe, MD  HYDROcodone-acetaminophen (NORCO/VICODIN) 5-325 MG tablet Place 1 tablet into feeding tube every 8 (eight) hours as needed for moderate pain.    Yes [provider]  ipratropium-albuterol (DUONEB) 0.5-2.5 (3) MG/3ML SOLN Take 3 mLs by nebulization every 6 (six) hours as needed (SOB).   Yes [provider]  levETIRAcetam (KEPPRA) 100 MG/ML solution Take 10 mLs (1,000 mg total) by mouth 2 (two) times daily. Patient taking differently: Place 1,000 mg into feeding tube 2 (two) times daily.  07/07/17  Yes Velvet Bathe, MD  Multiple Vitamin (MULTIVITAMIN) LIQD Place 15 mLs into feeding tube daily. 05/01/18  Yes Georgette Shell, MD  nutrition supplement, JUVEN, Fanny Dance) PACK Place 1 packet into feeding tube 2 (two) times daily between meals. 05/01/18  Yes Georgette Shell, MD  Valproate Sodium (DEPAKENE) 250 MG/5ML SOLN solution Take 10 mLs (500 mg total) by mouth 2 (two) times daily. Patient taking differently: Take 250 mg by mouth 2 (two) times daily.  07/08/17   Yes Velvet Bathe, MD  carvedilol (COREG) 6.25 MG tablet Take 1 tablet (6.25 mg total) by mouth 2 (two) times daily with a meal. Patient not taking: Reported on 06/10/2018 07/08/17   Velvet Bathe, MD  vancomycin (VANCOCIN) 50 mg/mL oral solution Take 2.5 mLs (125 mg total) by mouth 4 (four) times daily. Continue till 05/05/2018. Patient not taking: Reported on 06/10/2018 05/01/18   Georgette Shell, MD     Garner Nash, DO [facility] 06/10/2018 3:52 PM  Personal pager: [phone] If unanswered, please page CCM On-call: [phone]

## 2018-06-10 NOTE — ED Provider Notes (Signed)
[facility] EMERGENCY DEPARTMENT Provider Note   CSN: [id number] Arrival date & time: 06/10/18  1110     History   Chief Complaint Chief Complaint  Patient presents with  . Fever/VENT  . [facility]    HPI Melissa Cochran is a 83 y.o. female.  Level 5 caveat due to dementia history, history is provided by EMS who states that patient spiked a fever today.  Patient lives at nursing home where she is on a chronic ventilator, has feeding tube.  Talked with the daughter who states that patient had stroke and then seizures and then cardiac arrest not while she is on ventilator.  Her baseline recently has been that she does not open her eyes spontaneously, she mostly grimaces to pain, does not interact or move her lower extremities.  She does mostly move her right upper extremity.  They confirm that patient is still full code.  Patient arrives with fever, tachycardia and concern for systemic infection.  Does have chronic sacral wound ulcer according to daughter who states that she had that recently cleaned out at the nursing home.  The history is provided by the EMS personnel and a caregiver.  Fever  Temp source:  Oral Severity:  Moderate Onset quality:  Sudden Timing:  Constant Progression:  Unchanged Chronicity:  New Relieved by:  Nothing Worsened by:  Nothing Risk factors: sick contacts     Past Medical History:  Diagnosis Date  . Adnexal mass   . Anxiety   . Arthritis   . Bladder tumor   . Bleeding ulcer   . CHF (congestive heart failure) ([place])   . CHF (congestive heart failure) ([place])   . Chronic bronchitis ([place])   . Chronic respiratory failure ([place]) 04/19/2018  . COPD (chronic obstructive pulmonary disease) ([place])   . Coronary artery disease   . CVA (cerebral vascular accident) ([place]) 04/19/2018  . DVT (deep venous thrombosis) ([facility])    RLE  . GERD (gastroesophageal reflux disease)   . Heart block    left  . History of bleeding ulcers     . History of kidney stones   . Hyperlipemia   . Hypertension   . Myocardial infarction ([place])    "slight one" (07/05/2017)  . Pneumonia    "several times" (07/05/2017)  . Seizures ([place]) 02/2017 "several"; 06/26/2017 X 1  . Shortness of breath dyspnea   . Stroke [facility]) 02/13/2018   "still right sided weakness" (07/05/2017)    Patient Active Problem List   Diagnosis Date Noted  . Sepsis ([place]) 06/10/2018  . Acute respiratory failure with hypoxia ([place])   . CVA (cerebral vascular accident) ([place]) 04/19/2018  . Chronic respiratory failure ([place]) 04/19/2018  . Acute encephalopathy 04/18/2018  . E-coli UTI, ESBL  06/30/2017  . A-fib ([place]) 06/30/2017  . Need for protective airway ventilation 06/30/2017  . Seizures ([place]) 06/26/2017  . Acute renal failure ([place])   . Elevated troponin I level   . Acute renal failure with tubular necrosis ([place])   . Palliative care encounter   . Pressure injury of skin 02/17/2017  . Bilateral lower extremity edema 10/08/2016  . Venous ulcer of ankle ([place]) 07/08/2016  . DVT (deep venous thrombosis) ([place]) 07/02/2016  . Swelling of limb 06/08/2016  . Pain in limb 06/08/2016  . Altered mental status 01/22/2016  . S/P coronary artery stent placement 10/30/2015  . Preop cardiovascular exam 10/08/2015  . SOB (shortness of breath) on exertion 10/08/2015  . Malignant neoplasm  of trigone of bladder ([place]) 09/29/2015  . Mass of lower lobe of left lung, incidetnal on CT 09/2015, smoker.  09/29/2015  . Other disorders of lung 09/29/2015  . Other microscopic hematuria 08/26/2015  . Kidney stone 08/26/2015  . Urge incontinence 08/26/2015  . Pneumonia 01/24/2015  . PNA (pneumonia) 01/24/2015  . Essential (primary) hypertension 12/09/2014  . Adnexal mass 11/28/2013  . History of cardiac catheterization 11/28/2013  . Hyperlipidemia 11/28/2013  . Other specified postprocedural states 11/28/2013    Past Surgical History:  Procedure Laterality Date  . ABDOMINAL  HYSTERECTOMY    . CARDIAC CATHETERIZATION Left 10/21/2015   Procedure: Left Heart Cath and Coronary Angiography;  Surgeon: Isaias Cowman, MD;  Location: [facility];  Service: Cardiovascular;  Laterality: Left;  . CARDIAC CATHETERIZATION N/A 10/21/2015   Procedure: Coronary Stent Intervention;  Surgeon: Isaias Cowman, MD;  Location: [facility];  Service: Cardiovascular;  Laterality: N/A;  . CARDIAC CATHETERIZATION    . CYSTOSCOPY W/ RETROGRADES Bilateral 01/20/2016   Procedure: CYSTOSCOPY WITH RETROGRADE PYELOGRAM;  Surgeon: Hollice Espy, MD;  Location: [facility];  Service: Urology;  Laterality: Bilateral;  . CYSTOSCOPY WITH STENT PLACEMENT Right 01/20/2016   Procedure: CYSTOSCOPY WITH STENT PLACEMENT;  Surgeon: Hollice Espy, MD;  Location: [facility];  Service: Urology;  Laterality: Right;  . HIP FRACTURE SURGERY Left   . STOMACH SURGERY     "stomach ulcers"  . TRACHEOSTOMY TUBE PLACEMENT N/A 03/02/2017   Procedure: TRACHEOSTOMY;  Surgeon: Rozetta Nunnery, MD;  Location: [facility];  Service: ENT;  Laterality: N/A;  . TRANSURETHRAL RESECTION OF BLADDER TUMOR N/A 01/20/2016   Procedure: TRANSURETHRAL RESECTION OF BLADDER TUMOR (TURBT);  Surgeon: Hollice Espy, MD;  Location: [facility];  Service: Urology;  Laterality: N/A;     OB History   No obstetric history on file.      Home Medications    Prior to Admission medications   Medication Sig Start Date End Date Taking? Authorizing Provider  acetaminophen (TYLENOL) 325 MG tablet Take 2 tablets (650 mg total) by mouth every 6 (six) hours as needed for mild pain (or Fever >/= 101). 07/08/17   Velvet Bathe, MD  Amino Acids-Protein Hydrolys (FEEDING SUPPLEMENT, PRO-STAT SUGAR FREE 64,) LIQD Place 30 mLs into feeding tube 2 (two) times daily. 05/01/18   Georgette Shell, MD  apixaban (ELIQUIS) 2.5 MG TABS tablet Place 2.5 mg into feeding tube 2 (two) times daily.    [provider]  aspirin 81 MG  chewable tablet Chew 81 mg by mouth daily.    [provider]  carvedilol (COREG) 6.25 MG tablet Take 1 tablet (6.25 mg total) by mouth 2 (two) times daily with a meal. 07/08/17   Velvet Bathe, MD  chlorhexidine (PERIDEX) 0.12 % solution Use as directed 15 mLs in the mouth or throat 2 (two) times daily.    [provider]  diazepam (VALIUM) 5 MG tablet Take 2.5 mg by mouth every 12 (twelve) hours.    [provider]  famotidine (PEPCID) 20 MG tablet Take 20 mg by mouth at bedtime.    [provider]  furosemide (LASIX) 40 MG tablet Take 1 tablet (40 mg total) by mouth daily. 07/08/17   Velvet Bathe, MD  ipratropium-albuterol (DUONEB) 0.5-2.5 (3) MG/3ML SOLN Take 3 mLs by nebulization every 6 (six) hours as needed (SOB).    [provider]  levETIRAcetam (KEPPRA) 100 MG/ML solution Take 10 mLs (1,000 mg total) by mouth 2 (two) times  daily. 07/07/17   Velvet Bathe, MD  Multiple Vitamin (MULTIVITAMIN) LIQD Place 15 mLs into feeding tube daily. 05/01/18   Georgette Shell, MD  nutrition supplement, JUVEN, Fanny Dance) PACK Place 1 packet into feeding tube 2 (two) times daily between meals. 05/01/18   Georgette Shell, MD  Nutritional Supplements (FEEDING SUPPLEMENT, VITAL HIGH PROTEIN,) LIQD liquid Place 1,000 mLs into feeding tube daily. 05/01/18   Georgette Shell, MD  Nutritional Supplements (ISOSOURCE 1.5 CAL) LIQD 230 mLs by Gastric Tube route every 4 (four) hours.    [provider]  Valproate Sodium (DEPAKENE) 250 MG/5ML SOLN solution Take 10 mLs (500 mg total) by mouth 2 (two) times daily. Patient taking differently: Take 250 mg by mouth 2 (two) times daily.  07/08/17   Velvet Bathe, MD  vancomycin (VANCOCIN) 50 mg/mL oral solution Take 2.5 mLs (125 mg total) by mouth 4 (four) times daily. Continue till 05/05/2018. 05/01/18   Georgette Shell, MD    Family History Family History  Problem Relation Age of Onset  . Stomach cancer  Mother   . CVA Father   . Bladder Cancer Neg Hx   . Kidney cancer Neg Hx     Social History Social History   Tobacco Use  . Smoking status: Former Smoker    Packs/day: 1.00    Years: 40.00    Pack years: 40.00    Last attempt to quit: 06/13/1998    Years since quitting: 20.0  . Smokeless tobacco: Never Used  Substance Use Topics  . Alcohol use: No  . Drug use: No     Allergies   Patient has no known allergies.   Review of Systems Review of Systems  Unable to perform ROS: Dementia  Constitutional: Positive for fever.     Physical Exam Updated Vital Signs  ED Triage Vitals  Enc Vitals Group     BP 06/10/18 1127 (!) 109/55     Pulse Rate 06/10/18 1127 (!) 129     Resp 06/10/18 1127 (!) 26     Temp 06/10/18 1127 (!) 103.3 F (39.6 C)     Temp Source 06/10/18 1127 Rectal     SpO2 06/10/18 1123 100 %     Weight --      Height --      Head Circumference --      Peak Flow --      Pain Score --      Pain Loc --      Pain Edu? --      Excl. in [place]? --     Physical Exam Constitutional:      General: She is in acute distress.     Appearance: She is ill-appearing (chronic).  HENT:     Head: Normocephalic and atraumatic.     Nose: No congestion.     Mouth/Throat:     Mouth: Mucous membranes are dry.  Eyes:     Extraocular Movements: Extraocular movements intact.     Conjunctiva/sclera: Conjunctivae normal.     Pupils: Pupils are equal, round, and reactive to light.     Comments: No gaze preference   Neck:     Musculoskeletal: Neck supple.  Cardiovascular:     Rate and Rhythm: Tachycardia present. Rhythm irregular.  Pulmonary:     Breath sounds: Rhonchi present.     Comments: Diminished breath sounds throughout, patient is on ventilator at minimal vent settings Abdominal:     General: There is no distension.  Tenderness: There is no abdominal tenderness.  Musculoskeletal:     Right lower leg: No edema.     Left lower leg: No edema.  Skin:     General: Skin is dry.     Comments: Sacral wound ulcer with overall clean margins, there is some purulent material within the wound itself  Neurological:     GCS: GCS eye subscore is 1. GCS verbal subscore is 1. GCS motor subscore is 5.     Comments: Moves her right upper extremity when stimulated by pain      ED Treatments / Results  Labs (all labs ordered are listed, but only abnormal results are displayed) Labs Reviewed  LACTIC ACID, PLASMA - Abnormal; Notable for the following components:      Result Value   Lactic Acid, Venous 2.7 (*)    All other components within normal limits  LACTIC ACID, PLASMA - Abnormal; Notable for the following components:   Lactic Acid, Venous 2.8 (*)    All other components within normal limits  COMPREHENSIVE METABOLIC PANEL - Abnormal; Notable for the following components:   Glucose, Bld 190 (*)    BUN 54 (*)    Albumin 2.4 (*)    All other components within normal limits  CBC WITH DIFFERENTIAL/PLATELET - Abnormal; Notable for the following components:   WBC 16.5 (*)    RBC 3.81 (*)    Hemoglobin 9.5 (*)    MCH 24.9 (*)    MCHC 25.8 (*)    RDW 17.2 (*)    Neutro Abs 14.8 (*)    Abs Immature Granulocytes 0.10 (*)    All other components within normal limits  URINALYSIS, ROUTINE W REFLEX MICROSCOPIC - Abnormal; Notable for the following components:   APPearance CLOUDY (*)    Hgb urine dipstick SMALL (*)    Nitrite POSITIVE (*)    Leukocytes, UA LARGE (*)    WBC, UA >50 (*)    Bacteria, UA FEW (*)    All other components within normal limits  POCT I-STAT 7, (LYTES, BLD GAS, ICA,H+H) - Abnormal; Notable for the following components:   pH, Arterial 7.458 (*)    Bicarbonate 29.4 (*)    Acid-Base Excess 6.0 (*)    HCT 31.0 (*)    Hemoglobin 10.5 (*)    All other components within normal limits  CULTURE, BLOOD (ROUTINE X 2)  CULTURE, BLOOD (ROUTINE X 2)  URINE CULTURE  EXPECTORATED SPUTUM ASSESSMENT W REFEX TO RESP CULTURE  RESPIRATORY  PANEL BY PCR  LACTIC ACID, PLASMA  LACTIC ACID, PLASMA  PROCALCITONIN    EKG EKG Interpretation  Date/Time:  Saturday June 10 2018 11:14:46 EST Ventricular Rate:  141 PR Interval:    QRS Duration: 84 QT Interval:  294 QTC Calculation: 451 R Axis:   8 Text Interpretation:  Atrial fibrillation Anterior infarct, old Repolarization abnormality, prob rate related Confirmed by Lennice Sites [phone]) on 06/10/2018 2:05:11 PM   Radiology Dg Chest Portable 1 View  Result Date: 06/10/2018 CLINICAL DATA:  Fever and tachycardia today. EXAM: PORTABLE CHEST 1 VIEW COMPARISON:  Single-view of the chest 04/23/2018, 04/19/2018, 08/24/2017 and 06/28/2017. FINDINGS: Tracheostomy tube and right PICC are in place. Left basilar atelectasis is seen. The right lung is clear. There is cardiomegaly. Aortic atherosclerosis is identified. No pneumothorax or pleural effusion. IMPRESSION: No acute disease. Electronically Signed   By: Inge Rise M.D.   On: 06/10/2018 12:12    Procedures .Critical Care Performed by: Lennice Sites, DO Authorized  by: Lennice Sites, DO   Critical care provider statement:    Critical care time (minutes):  40   Critical care was necessary to treat or prevent imminent or life-threatening deterioration of the following conditions:  Sepsis   Critical care was time spent personally by me on the following activities:  Development of treatment plan with patient or surrogate, discussions with primary provider, evaluation of patient's response to treatment, examination of patient, obtaining history from patient or surrogate, ordering and performing treatments and interventions, ordering and review of laboratory studies, ordering and review of radiographic studies, pulse oximetry, re-evaluation of patient's condition and review of old charts   I assumed direction of critical care for this patient from another provider in my specialty: no     (including critical care  time)  Medications Ordered in ED Medications  sodium chloride 0.9 % bolus 1,000 mL (1,000 mLs Intravenous New Bag/Given 06/10/18 1343)  vancomycin (VANCOCIN) 500 mg in sodium chloride 0.9 % 100 mL IVPB (has no administration in time range)  vancomycin (VANCOCIN) 1,250 mg in sodium chloride 0.9 % 250 mL IVPB (has no administration in time range)  ceFEPIme (MAXIPIME) 2 g in sodium chloride 0.9 % 100 mL IVPB (has no administration in time range)  carvedilol (COREG) tablet 6.25 mg (has no administration in time range)  diazepam (VALIUM) tablet 2.5 mg (has no administration in time range)  famotidine (PEPCID) tablet 20 mg (has no administration in time range)  apixaban (ELIQUIS) tablet 2.5 mg (has no administration in time range)  levETIRAcetam (KEPPRA) 100 MG/ML solution 1,000 mg (has no administration in time range)  Valproate Sodium (DEPAKENE) solution 250 mg (has no administration in time range)  nutrition supplement (JUVEN) (JUVEN) powder packet 1 packet (has no administration in time range)  feeding supplement (PRO-STAT SUGAR FREE 64) liquid 30 mL (has no administration in time range)  multivitamin liquid 15 mL (has no administration in time range)  feeding supplement (VITAL HIGH PROTEIN) liquid 1,000 mL (has no administration in time range)  ISOSOURCE 1.5 CAL LIQD 230 mL (has no administration in time range)  ipratropium-albuterol (DUONEB) 0.5-2.5 (3) MG/3ML nebulizer solution 3 mL (has no administration in time range)  chlorhexidine (PERIDEX) 0.12 % solution 15 mL (has no administration in time range)  sodium chloride flush (NS) 0.9 % injection 3 mL (has no administration in time range)  lactated ringers infusion (has no administration in time range)  acetaminophen (TYLENOL) tablet 650 mg (has no administration in time range)    Or  acetaminophen (TYLENOL) suppository 650 mg (has no administration in time range)  docusate sodium (COLACE) capsule 100 mg (has no administration in time range)   polyethylene glycol (MIRALAX / GLYCOLAX) packet 17 g (has no administration in time range)  ondansetron (ZOFRAN) tablet 4 mg (has no administration in time range)    Or  ondansetron (ZOFRAN) injection 4 mg (has no administration in time range)  vancomycin (VANCOCIN) IVPB 1000 mg/200 mL premix (0 mg Intravenous Stopped 06/10/18 1323)  ceFEPIme (MAXIPIME) 2 g in sodium chloride 0.9 % 100 mL IVPB (0 g Intravenous Stopped 06/10/18 1232)  sodium chloride 0.9 % bolus 1,000 mL (0 mLs Intravenous Stopped 06/10/18 1323)  acetaminophen (TYLENOL) solution 1,000 mg (1,000 mg Oral Given 06/10/18 1214)     Initial Impression / Assessment and Plan / ED Course  I have reviewed the triage vital signs and the nursing notes.  Pertinent labs & imaging results that were available during my care of the patient  were reviewed by me and considered in my medical decision making (see chart for details).     LAQUAN LUDDEN is an 83 year old female with history of stroke status post tracheostomy, PEG tube, chronically bedbound who presents to the ED with concern for sepsis from her nursing home.  Patient tachycardic and febrile upon arrival.  Blood pressure is normal.  Patient has chronic indwelling Foley catheter as well.  EKG shows atrial fibrillation with RVR which is likely secondary to sepsis.  Multiple sources for infection including sacral wound which appears to have some purulent discharge from it.  However surrounding area around the skin appears fairly well.  Will start broad-spectrum antibiotics with IV vancomycin IV cefepime.  Patient does not appear to have increased secretions from her trach.  She is on minimal vent settings.  Sepsis work-up was initiated with blood cultures, urine studies, lactic acid.  Patient given normal saline bolus.  Given Tylenol.  Patient appears to be at her baseline, she opens her eyes spontaneously, withdraws her right upper extremity to pain.  Contacted the patient's family who state  that the patient is still full code.  Patient with leukocytosis, elevated lactic acid and urinalysis consistent with infection.  Chest x-ray showed no signs of pneumonia, pneumothorax, pleural effusion.  Patient otherwise with no significant electrolyte abnormality, kidney injury.  Patient had repeat lactic acid that was stable at 2.8 and therefore an additional normal saline bolus was initiated.  ICU was consulted for ventilator management but at this time patient does not require an vasopressors and will admit to the stepdown unit with the hospitalist.  Patient had improvement of tachycardia following fluid bolus.  Hemodynamically stable throughout my care.  This chart was dictated using voice recognition software.  Despite best efforts to proofread,  errors can occur which can change the documentation meaning.    Final Clinical Impressions(s) / ED Diagnoses   Final diagnoses:  Sepsis, due to unspecified organism, unspecified whether acute organ dysfunction present [facility])  Acute cystitis without hematuria  Atrial fibrillation, unspecified type [facility])    ED Discharge Orders    None       Lennice Sites, DO 06/10/18 1409

## 2018-06-11 DIAGNOSIS — I4891 Unspecified atrial fibrillation: Secondary | ICD-10-CM

## 2018-06-11 LAB — URINE CULTURE: Special Requests: NORMAL

## 2018-06-11 LAB — GLUCOSE, CAPILLARY
GLUCOSE-CAPILLARY: 110 mg/dL — AB (ref 70–99)
Glucose-Capillary: 103 mg/dL — ABNORMAL HIGH (ref 70–99)
Glucose-Capillary: 106 mg/dL — ABNORMAL HIGH (ref 70–99)
Glucose-Capillary: 112 mg/dL — ABNORMAL HIGH (ref 70–99)
Glucose-Capillary: 115 mg/dL — ABNORMAL HIGH (ref 70–99)
Glucose-Capillary: 119 mg/dL — ABNORMAL HIGH (ref 70–99)

## 2018-06-11 LAB — BASIC METABOLIC PANEL
Anion gap: 8 (ref 5–15)
BUN: 50 mg/dL — ABNORMAL HIGH (ref 8–23)
CHLORIDE: 112 mmol/L — AB (ref 98–111)
CO2: 26 mmol/L (ref 22–32)
Calcium: 9.5 mg/dL (ref 8.9–10.3)
Creatinine, Ser: 0.46 mg/dL (ref 0.44–1.00)
GFR calc Af Amer: >60 mL/min (ref >60)
GFR calc non Af Amer: >60 mL/min (ref >60)
Glucose, Bld: 120 mg/dL — ABNORMAL HIGH (ref 70–99)
POTASSIUM: 4.4 mmol/L (ref 3.5–5.1)
Sodium: 146 mmol/L — ABNORMAL HIGH (ref 135–145)

## 2018-06-11 LAB — CBC
HCT: 30 % — ABNORMAL LOW (ref 36.0–46.0)
Hemoglobin: 7.9 g/dL — ABNORMAL LOW (ref 12.0–15.0)
MCH: 25.2 pg — ABNORMAL LOW (ref 26.0–34.0)
MCHC: 26.3 g/dL — ABNORMAL LOW (ref 30.0–36.0)
MCV: 95.5 fL (ref 80.0–100.0)
Platelets: 316 10*3/uL (ref 150–400)
RBC: 3.14 MIL/uL — ABNORMAL LOW (ref 3.87–5.11)
RDW: 17 % — ABNORMAL HIGH (ref 11.5–15.5)
WBC: 13.3 10*3/uL — ABNORMAL HIGH (ref 4.0–10.5)
nRBC: 0.2 % (ref 0.0–0.2)

## 2018-06-11 MED ORDER — ALBUTEROL SULFATE (2.5 MG/3ML) 0.083% IN NEBU: 2.5000 mg | INHALATION_SOLUTION | RESPIRATORY_TRACT | Status: DC | PRN

## 2018-06-11 MED ORDER — TRAMADOL HCL 50 MG PO TABS: 50.0000 mg | ORAL_TABLET | Freq: Four times a day (QID) | ORAL | Status: DC | PRN

## 2018-06-11 MED ORDER — FREE WATER
200.0000 mL | Status: DC
  Administered 2018-06-11 – 2018-06-26 (×90): 200 mL

## 2018-06-11 MED ORDER — OXYCODONE HCL 5 MG/5ML PO SOLN
5.0000 mg | ORAL | Status: DC | PRN
  Administered 2018-06-11 – 2018-06-15 (×2): 5 mg
  Administered 2018-06-16 – 2018-06-26 (×10): 10 mg
  Filled 2018-06-11 (×3): qty 10
  Filled 2018-06-11: qty 5
  Filled 2018-06-11 (×5): qty 10
  Filled 2018-06-11: qty 5
  Filled 2018-06-11 (×2): qty 10

## 2018-06-11 MED ORDER — MORPHINE SULFATE (PF) 2 MG/ML IV SOLN
2.0000 mg | INTRAVENOUS | Status: DC | PRN
  Administered 2018-06-13 – 2018-06-19 (×10): 2 mg via INTRAVENOUS
  Administered 2018-06-19: 4 mg via INTRAVENOUS
  Administered 2018-06-19: 2 mg via INTRAVENOUS
  Administered 2018-06-25: 4 mg via INTRAVENOUS
  Administered 2018-06-26: 2 mg via INTRAVENOUS
  Filled 2018-06-11: qty 2
  Filled 2018-06-11 (×3): qty 1
  Filled 2018-06-11: qty 2
  Filled 2018-06-11 (×9): qty 1

## 2018-06-11 MED ORDER — FREE WATER: 250.0000 mL | Status: DC

## 2018-06-11 MED ORDER — SODIUM CHLORIDE 0.45 % IV SOLN
INTRAVENOUS | Status: DC
  Administered 2018-06-11 – 2018-06-25 (×6): via INTRAVENOUS

## 2018-06-11 MED ORDER — ACETAMINOPHEN 160 MG/5ML PO SOLN
650.0000 mg | Freq: Four times a day (QID) | ORAL | Status: DC | PRN
  Administered 2018-06-11 – 2018-06-12 (×2): 650 mg
  Filled 2018-06-11 (×3): qty 20.3

## 2018-06-11 NOTE — Progress Notes (Addendum)
[facility] TEAM 1 - Stepdown/ICU TEAM  Melissa Cochran  BCW:[id number] DOB: 12/06/1935 DOA: 06/10/2018 PCP: [name], MD    Brief Narrative:  83yo F w/ a hx of chronic atrial fibrillation on apixaban, HTN, CAD s/p stent in 0388, chronic systolic CHF with EF of 82-80% 06/28/2017, nephrolithiasis complicated by hydronephrosis status post stent placement in 2017, CVA, seizure disorder, and chronic respiratory failure with ventilator and PEG dependence who presented from [facility] for fever. At baseline, on "good days" she reportedly opens her eyes, but does not otherwise communicate. In the ED she was noted to have a sacral ulcer with purulence. A CXR was unrevealing.   Significant Events: 2/1 admit from [facility]  Subjective: The patient is noncommunicative.  She will not open her eyes to exam/stimulation.  At times she grimaces and twists her left hand.  There is no other meaningful movement appreciated.  There is no evidence of respiratory distress.  There is no family present.  Assessment & Plan:  Sepsis of unclear etiology Respiratory virus panel negative -blood cultures negative thus far - Chest x-ray without acute findings -procalcitonin not compelling - Fever as high as 103.3 since admission -continue empiric antibiotic for now and follow-up urine culture -likely candidates include UTI versus infected sacral/back wound  Possible UTI -chronic Foley catheter Presence of chronic Foley makes urinalysis essentially useless -replace Foley -follow-up urine culture  Chronic hypoxic respiratory failure -chronic vent/trach dependence Appears stable on ventilator  Chronic atrial fibrillation Rate controlled at this time -continue apixaban  Hypernatremia -dehydration Increase free water administration  Normocytic anemia Acute significant drop in hemoglobin since admission likely delusional with ongoing volume resuscitation - no evidence of acute blood loss  Chronic systolic  CHF EF 03-49% per TTE February 2019 -no volume overload at this time  History of CVA Noncommunicative -exceedingly poor perceived quality of life  Seizure disorder No evidence of seizure activity  Sacral decubitus ulcer -decubitus of back WOC consulted  Nutrition -PEG tube dependent Nutrition consulted  Torticollis  Goals of care I will investigate if Palliative Care has been involved in the care of this patient previously -it appears to me that she has an exceedingly poor quality of life with an extremely low likelihood of making a meaningful recovery  DVT prophylaxis: Apixaban Code Status: FULL CODE Family Communication: no family present at time of exam  Disposition Plan: ICU on chronic vent  Consultants:  PCCM  Antimicrobials:  Meropenem 2/1  Cefepime 2/1 > Vancomycin 2/1  Objective: Blood pressure (!) 102/58, pulse 97, temperature 99.7 F (37.6 C), temperature source Oral, resp. rate 18, height '5\' 4"'$  (1.626 m), weight 77.1 kg, SpO2 99 %.  Intake/Output Summary (Last 24 hours) at 06/11/2018 0851 Last data filed at 06/11/2018 0400 Gross per 24 hour  Intake 4216.31 ml  Output 950 ml  Net 3266.31 ml   Filed Weights   06/10/18 1320  Weight: 77.1 kg    Examination: General: No acute respiratory distress evident -noncommunicative Lungs: Clear to auscultation bilaterally without wheezes or crackles Cardiovascular: Irregularly irregular without murmur or rub Abdomen: PEG insertion site clean and dry, soft, nondistended, no masses, bowel sounds hypoactive Extremities: No significant cyanosis, clubbing, or edema bilateral lower extremities  CBC: Recent Labs  Lab 06/10/18 1145 06/10/18 1210 06/11/18 0314  WBC 16.5*  --  13.3*  NEUTROABS 14.8*  --   --   HGB 9.5* 10.5* 7.9*  HCT 36.8 31.0* 30.0*  MCV 96.6  --  95.5  PLT 365  --  016   Basic Metabolic Panel: Recent Labs  Lab 06/10/18 1145 06/10/18 1210 06/11/18 0314  NA 145 145 146*  K 4.6 4.5 4.4  CL  108  --  112*  CO2 26  --  26  GLUCOSE 190*  --  120*  BUN 54*  --  50*  CREATININE 0.57  --  0.46  CALCIUM 10.0  --  9.5   GFR: Estimated Creatinine Clearance: 56.4 mL/min (by C-G formula based on SCr of 0.46 mg/dL).  Liver Function Tests: Recent Labs  Lab 06/10/18 1145  AST 21  ALT 17  ALKPHOS 72  BILITOT 0.5  PROT 6.6  ALBUMIN 2.4*    HbA1C: Hgb A1c MFr Bld  Date/Time Value Ref Range Status  02/13/2017 09:35 PM 5.1 4.8 - 5.6 % Final    Comment:    (NOTE) Pre diabetes:          5.7%-6.4% Diabetes:              >6.4% Glycemic control for   <7.0% adults with diabetes     CBG: Recent Labs  Lab 06/10/18 2003 06/10/18 2344 06/11/18 0340 06/11/18 0742  GLUCAP 107* 116* 110* 115*    Recent Results (from the past 240 hour(s))  Blood Culture (routine x 2)     Status: None (Preliminary result)   Collection Time: 06/10/18 11:38 AM  Result Value Ref Range Status   Specimen Description BLOOD RIGHT ANTECUBITAL  Final   Special Requests   Final    BOTTLES DRAWN AEROBIC AND ANAEROBIC Blood Culture adequate volume Performed at [facility] [address] [place], [place] [address]    Culture NO GROWTH < 24 HOURS  Final   Report Status PENDING  Incomplete  Respiratory Panel by PCR     Status: None   Collection Time: 06/10/18 11:39 AM  Result Value Ref Range Status   Adenovirus NOT DETECTED NOT DETECTED Final   Coronavirus 229E NOT DETECTED NOT DETECTED Final   Coronavirus HKU1 NOT DETECTED NOT DETECTED Final   Coronavirus NL63 NOT DETECTED NOT DETECTED Final   Coronavirus OC43 NOT DETECTED NOT DETECTED Final   Metapneumovirus NOT DETECTED NOT DETECTED Final   Rhinovirus / Enterovirus NOT DETECTED NOT DETECTED Final   Influenza A NOT DETECTED NOT DETECTED Final   Influenza B NOT DETECTED NOT DETECTED Final   Parainfluenza Virus 1 NOT DETECTED NOT DETECTED Final   Parainfluenza Virus 2 NOT DETECTED NOT DETECTED Final   Parainfluenza Virus 3 NOT DETECTED NOT  DETECTED Final   Parainfluenza Virus 4 NOT DETECTED NOT DETECTED Final   Respiratory Syncytial Virus NOT DETECTED NOT DETECTED Final   Bordetella pertussis NOT DETECTED NOT DETECTED Final   Chlamydophila pneumoniae NOT DETECTED NOT DETECTED Final   Mycoplasma pneumoniae NOT DETECTED NOT DETECTED Final  Blood Culture (routine x 2)     Status: None (Preliminary result)   Collection Time: 06/10/18 11:43 AM  Result Value Ref Range Status   Specimen Description BLOOD BLOOD LEFT HAND  Final   Special Requests   Final    BOTTLES DRAWN AEROBIC AND ANAEROBIC Blood Culture adequate volume Performed at [facility], [address] [place], [place] [address]    Culture NO GROWTH < 24 HOURS  Final   Report Status PENDING  Incomplete  MRSA PCR Screening     Status: None   Collection Time: 06/10/18  5:09 PM  Result Value Ref Range Status  MRSA by PCR NEGATIVE NEGATIVE Final    Comment:        The GeneXpert MRSA Assay (FDA approved for NASAL specimens only), is one component of a comprehensive MRSA colonization surveillance program. It is not intended to diagnose MRSA infection nor to guide or monitor treatment for MRSA infections. Performed at [facility], [place] [address] [place], [place] [address]   Culture, respiratory (non-expectorated)     Status: None (Preliminary result)   Collection Time: 06/10/18  5:16 PM  Result Value Ref Range Status   Specimen Description TRACHEAL ASPIRATE  Final   Special Requests   Final    NONE Performed at [facility], [place] [address], [place], [place] [address]    Gram Stain   Final    ABUNDANT WBC PRESENT, PREDOMINANTLY PMN FEW GRAM POSITIVE RODS RARE GRAM POSITIVE COCCI RARE GRAM NEGATIVE RODS    Culture PENDING  Incomplete   Report Status PENDING  Incomplete     Scheduled Meds: . apixaban  2.5 mg Per Tube BID  . carvedilol  6.25 mg Per Tube BID WC  . chlorhexidine gluconate (MEDLINE KIT)  15 mL Mouth Rinse BID  .  Chlorhexidine Gluconate Cloth  6 each Topical Daily  . diazepam  2.5 mg Per Tube Q12H  . docusate  100 mg Per Tube BID  . famotidine  20 mg Per Tube QHS  . feeding supplement (PRO-STAT SUGAR FREE 64)  30 mL Per Tube BID  . feeding supplement (VITAL HIGH PROTEIN)  1,000 mL Per Tube Q24H  . insulin aspart  0-9 Units Subcutaneous Q4H  . levETIRAcetam  1,000 mg Per Tube BID  . mouth rinse  15 mL Mouth Rinse 10 times per day  . multivitamin  15 mL Per Tube Daily  . nutrition supplement (JUVEN)  1 packet Per Tube BID BM  . sodium chloride flush  3 mL Intravenous Q12H  . valproic acid  250 mg Per Tube BID   Continuous Infusions: . sodium chloride 10 mL/hr at 06/11/18 0400  . lactated ringers 100 mL/hr at 06/11/18 0400  . meropenem (MERREM) IV 1 g (06/11/18 0257)  . vancomycin       LOS: 1 day   Cherene Altes, MD [facility] Office  [phone] Pager - Text Page per Amion  If 7PM-7AM, please contact night-coverage per Amion 06/11/2018, 8:51 AM

## 2018-06-11 NOTE — Progress Notes (Signed)
Chronic 3F Foley from [facility] was changed today by Barbaraann Rondo RN and Rolene Arbour to a 3F temp foley per MD orders.

## 2018-06-12 LAB — CBC
HCT: 29.5 % — ABNORMAL LOW (ref 36.0–46.0)
HEMOGLOBIN: 7.6 g/dL — AB (ref 12.0–15.0)
MCH: 24.6 pg — ABNORMAL LOW (ref 26.0–34.0)
MCHC: 25.8 g/dL — ABNORMAL LOW (ref 30.0–36.0)
MCV: 95.5 fL (ref 80.0–100.0)
Platelets: 278 10*3/uL (ref 150–400)
RBC: 3.09 MIL/uL — ABNORMAL LOW (ref 3.87–5.11)
RDW: 17 % — ABNORMAL HIGH (ref 11.5–15.5)
WBC: 7.6 10*3/uL (ref 4.0–10.5)
nRBC: 0.3 % — ABNORMAL HIGH (ref 0.0–0.2)

## 2018-06-12 LAB — COMPREHENSIVE METABOLIC PANEL
ALT: 12 U/L (ref 0–44)
AST: 17 U/L (ref 15–41)
Albumin: 2 g/dL — ABNORMAL LOW (ref 3.5–5.0)
Alkaline Phosphatase: 56 U/L (ref 38–126)
Anion gap: 8 (ref 5–15)
BUN: 52 mg/dL — ABNORMAL HIGH (ref 8–23)
CO2: 26 mmol/L (ref 22–32)
Calcium: 9.3 mg/dL (ref 8.9–10.3)
Chloride: 111 mmol/L (ref 98–111)
Creatinine, Ser: 0.44 mg/dL (ref 0.44–1.00)
GFR calc Af Amer: >60 mL/min (ref >60)
GFR calc non Af Amer: >60 mL/min (ref >60)
GLUCOSE: 98 mg/dL (ref 70–99)
Potassium: 3.7 mmol/L (ref 3.5–5.1)
Sodium: 145 mmol/L (ref 135–145)
Total Bilirubin: 0.7 mg/dL (ref 0.3–1.2)
Total Protein: 5.3 g/dL — ABNORMAL LOW (ref 6.5–8.1)

## 2018-06-12 LAB — C DIFFICILE QUICK SCREEN W PCR REFLEX
C Diff antigen: NEGATIVE
C Diff interpretation: NOT DETECTED
C Diff toxin: NEGATIVE

## 2018-06-12 LAB — MAGNESIUM: Magnesium: 2.1 mg/dL (ref 1.7–2.4)

## 2018-06-12 LAB — IRON AND TIBC
IRON: 14 ug/dL — AB (ref 28–170)
Saturation Ratios: 4 % — ABNORMAL LOW (ref 10.4–31.8)
TIBC: 393 ug/dL (ref 250–450)
UIBC: 379 ug/dL

## 2018-06-12 LAB — GLUCOSE, CAPILLARY
GLUCOSE-CAPILLARY: 102 mg/dL — AB (ref 70–99)
Glucose-Capillary: 96 mg/dL (ref 70–99)
Glucose-Capillary: 132 mg/dL — ABNORMAL HIGH (ref 70–99)
Glucose-Capillary: 94 mg/dL (ref 70–99)
Glucose-Capillary: 77 mg/dL (ref 70–99)
Glucose-Capillary: 81 mg/dL (ref 70–99)

## 2018-06-12 LAB — RETICULOCYTES
Immature Retic Fract: 19.7 % — ABNORMAL HIGH (ref 2.3–15.9)
RBC.: 3.09 MIL/uL — ABNORMAL LOW (ref 3.87–5.11)
Retic Count, Absolute: 58.1 10*3/uL (ref 19.0–186.0)
Retic Ct Pct: 1.9 % (ref 0.4–3.1)

## 2018-06-12 LAB — FERRITIN: Ferritin: 34 ng/mL (ref 11–307)

## 2018-06-12 LAB — FOLATE: Folate: 21.8 ng/mL (ref >5.9)

## 2018-06-12 LAB — VITAMIN B12: Vitamin B-12: 790 pg/mL (ref 180–914)

## 2018-06-12 LAB — PHOSPHORUS: Phosphorus: 2.5 mg/dL (ref 2.5–4.6)

## 2018-06-12 MED ORDER — COLLAGENASE 250 UNIT/GM EX OINT
TOPICAL_OINTMENT | Freq: Every day | CUTANEOUS | Status: AC
  Administered 2018-06-12 – 2018-06-14 (×3): via TOPICAL
  Administered 2018-06-15: 1 via TOPICAL
  Administered 2018-06-16 – 2018-06-22 (×7): via TOPICAL
  Administered 2018-06-23: 1 via TOPICAL
  Administered 2018-06-24 – 2018-06-25 (×2): via TOPICAL
  Filled 2018-06-12 (×2): qty 30

## 2018-06-12 MED ORDER — SODIUM CHLORIDE 0.9 % IV BOLUS
500.0000 mL | Freq: Once | INTRAVENOUS | Status: AC
  Administered 2018-06-12: 500 mL via INTRAVENOUS

## 2018-06-12 MED ORDER — PRO-STAT SUGAR FREE PO LIQD: 30.0000 mL | Freq: Two times a day (BID) | ORAL | Status: DC

## 2018-06-12 MED ORDER — VITAL AF 1.2 CAL PO LIQD
1000.0000 mL | ORAL | Status: DC
  Administered 2018-06-12 – 2018-06-21 (×9): 1000 mL
  Filled 2018-06-12 (×3): qty 1000

## 2018-06-12 MED ORDER — VANCOMYCIN HCL 10 G IV SOLR
1250.0000 mg | INTRAVENOUS | Status: DC
  Administered 2018-06-12 – 2018-06-14 (×3): 1250 mg via INTRAVENOUS
  Filled 2018-06-12 (×4): qty 1250

## 2018-06-12 MED ORDER — SODIUM CHLORIDE 0.9 % IV BOLUS: 500.0000 mL | Freq: Once | INTRAVENOUS | Status: DC

## 2018-06-12 MED ORDER — SODIUM CHLORIDE 0.9 % IV SOLN
510.0000 mg | Freq: Once | INTRAVENOUS | Status: AC
  Administered 2018-06-12: 510 mg via INTRAVENOUS
  Filled 2018-06-12: qty 17

## 2018-06-12 NOTE — Progress Notes (Signed)
[facility] TEAM 1 - Stepdown/ICU TEAM  Melissa Cochran  GXQ:[id number] DOB: Sep 16, 1935 DOA: 06/10/2018 PCP: [name], MD    Brief Narrative:  83yo F w/ a hx of chronic atrial fibrillation on apixaban, HTN, CAD s/p stent in 1448, chronic systolic CHF with EF of 18-56% 06/28/2017, nephrolithiasis complicated by hydronephrosis status post stent placement in 2017, CVA, seizure disorder, and chronic respiratory failure with ventilator and PEG dependence who presented from [facility] for fever. At baseline, on "good days" she reportedly opens her eyes, but does not otherwise communicate. In the ED she was noted to have a sacral ulcer with purulence. A CXR was unrevealing.   Significant Events: 2/1 admit from [facility]  Subjective: The patient will open her eyes and look at the examiner.  She appears to smile.  There is no evidence of respiratory distress or discomfort at the present time.  There is no family present at the time of my exam.  Assessment & Plan:  Sepsis of unclear etiology Respiratory virus panel negative - blood cultures negative thus far - CXR without acute findings - procalcitonin not compelling - fever as high as 103.3 since admission - continue empiric antibiotic for now - urine culture not helpful - likely candidates include UTI versus infected sacral/back wound - sepsis physiology improving - R arm PICC reportedly placed at [facility] on the day of her admit to [facility] (06/10/18) - continues to have fevers - resume Vanc as pt lives in a healthcare facility   Possible UTI - chronic Foley catheter Presence of chronic Foley makes urinalysis essentially useless - replaced Foley 2/2 w/ old foley being described as "very dirty" per RN - urine culture not helpful this admit - hx of ESBL E coli (sensitive to Imipenem) Feb 2019  Chronic hypoxic respiratory failure -chronic vent/trach dependence Appears stable on ventilator via trach   Chronic atrial fibrillation Rate controlled -  continue apixaban  Hypernatremia - dehydration Cont free water - Na has normalized  Normocytic anemia drop in hemoglobin since admission likely dilutional - no evidence of acute blood loss - Fe studies c/w severe Fe deficiency - will load w/ IV Fe in absence of bacteremia - do not feel pt is an appropriate candidate for a GI eval at this time - follow for signs of blood loss   Chronic systolic CHF EF 31-49% per TTE February 2019 - presently still appears a bit dry   [employer]   06/10/18 1320  Weight: 77.1 kg    History of CVA Noncommunicative - exceedingly poor perceived quality of life  Seizure disorder No evidence of seizure activity  Decubitus ulcers  Stage 4 pressure injury:Sacrum: 3cm x 2.5cm x 0.5cm + Stage 3 pressure injury:Vertebral column: 5cm x 5.5cm x 1.5cm - care per [place] RN  Nutrition -PEG tube dependent Nutrition consulted  Torticollis  Goals of care Palliative Care was involved in the care of the patient during her last admit in December, at which time family insisted on ongoing full aggressive support (daugher was "okay with her mother not being able to [place], talk, walk, or even breathe on her own") - it appears to me that she has an exceedingly poor quality of life with an extremely low likelihood of making a meaningful recovery  DVT prophylaxis: Apixaban Code Status: FULL CODE Family Communication: no family present at time of exam  Disposition Plan: ICU on chronic vent  Consultants:  PCCM  Antimicrobials:  Meropenem 2/1  Cefepime 2/1 > Vancomycin  2/1 + 2/3 >  Objective: Blood pressure (!) 107/57, pulse 80, temperature 97.8 F (36.6 C), resp. rate (!) 23, height 5' 4" (1.626 m), weight 77.1 kg, SpO2 99 %.  Intake/Output Summary (Last 24 hours) at 06/12/2018 0929 Last data filed at 06/12/2018 3818 Gross per 24 hour  Intake 3965.69 ml  Output 1515 ml  Net 2450.69 ml   Filed Weights   06/10/18 1320  Weight: 77.1 kg     Examination: General: No acute respiratory distress - opens eyes  Lungs: mild bibasilar crackles - no wheezing  Cardiovascular: Irregularly irregular - no M or rub  Abdomen: PEG insertion site clean and dry, soft, NT/ND, no masses Extremities: no signif edema B LE   CBC: Recent Labs  Lab 06/10/18 1145 06/10/18 1210 06/11/18 0314 06/12/18 0207  WBC 16.5*  --  13.3* 7.6  NEUTROABS 14.8*  --   --   --   HGB 9.5* 10.5* 7.9* 7.6*  HCT 36.8 31.0* 30.0* 29.5*  MCV 96.6  --  95.5 95.5  PLT 365  --  316 299   Basic Metabolic Panel: Recent Labs  Lab 06/10/18 1145 06/10/18 1210 06/11/18 0314 06/12/18 0207  NA 145 145 146* 145  K 4.6 4.5 4.4 3.7  CL 108  --  112* 111  CO2 26  --  26 26  GLUCOSE 190*  --  120* 98  BUN 54*  --  50* 52*  CREATININE 0.57  --  0.46 0.44  CALCIUM 10.0  --  9.5 9.3  MG  --   --   --  2.1  PHOS  --   --   --  2.5   GFR: Estimated Creatinine Clearance: 56.4 mL/min (by C-G formula based on SCr of 0.44 mg/dL).  Liver Function Tests: Recent Labs  Lab 06/10/18 1145 06/12/18 0207  AST 21 17  ALT 17 12  ALKPHOS 72 56  BILITOT 0.5 0.7  PROT 6.6 5.3*  ALBUMIN 2.4* 2.0*    HbA1C: Hgb A1c MFr Bld  Date/Time Value Ref Range Status  02/13/2017 09:35 PM 5.1 4.8 - 5.6 % Final    Comment:    (NOTE) Pre diabetes:          5.7%-6.4% Diabetes:              >6.4% Glycemic control for   <7.0% adults with diabetes     CBG: Recent Labs  Lab 06/11/18 1622 06/11/18 1952 06/11/18 2332 06/12/18 0357 06/12/18 0728  GLUCAP 106* 112* 103* 96 102*    Recent Results (from the past 240 hour(s))  Blood Culture (routine x 2)     Status: None (Preliminary result)   Collection Time: 06/10/18 11:38 AM  Result Value Ref Range Status   Specimen Description BLOOD RIGHT ANTECUBITAL  Final   Special Requests   Final    BOTTLES DRAWN AEROBIC AND ANAEROBIC Blood Culture adequate volume   Culture   Final    NO GROWTH 2 DAYS Performed at [facility], [address], [place], [place] [address]    Report Status PENDING  Incomplete  Urine culture     Status: Abnormal   Collection Time: 06/10/18 11:38 AM  Result Value Ref Range Status   Specimen Description URINE, RANDOM  Final   Special Requests   Final    Normal Performed at [facility], [place] [address], [place], [place] [address]    Culture MULTIPLE SPECIES PRESENT, SUGGEST RECOLLECTION (A)  Final  Report Status 06/11/2018 FINAL  Final  Respiratory Panel by PCR     Status: None   Collection Time: 06/10/18 11:39 AM  Result Value Ref Range Status   Adenovirus NOT DETECTED NOT DETECTED Final   Coronavirus 229E NOT DETECTED NOT DETECTED Final   Coronavirus HKU1 NOT DETECTED NOT DETECTED Final   Coronavirus NL63 NOT DETECTED NOT DETECTED Final   Coronavirus OC43 NOT DETECTED NOT DETECTED Final   Metapneumovirus NOT DETECTED NOT DETECTED Final   Rhinovirus / Enterovirus NOT DETECTED NOT DETECTED Final   Influenza A NOT DETECTED NOT DETECTED Final   Influenza B NOT DETECTED NOT DETECTED Final   Parainfluenza Virus 1 NOT DETECTED NOT DETECTED Final   Parainfluenza Virus 2 NOT DETECTED NOT DETECTED Final   Parainfluenza Virus 3 NOT DETECTED NOT DETECTED Final   Parainfluenza Virus 4 NOT DETECTED NOT DETECTED Final   Respiratory Syncytial Virus NOT DETECTED NOT DETECTED Final   Bordetella pertussis NOT DETECTED NOT DETECTED Final   Chlamydophila pneumoniae NOT DETECTED NOT DETECTED Final   Mycoplasma pneumoniae NOT DETECTED NOT DETECTED Final  Blood Culture (routine x 2)     Status: None (Preliminary result)   Collection Time: 06/10/18 11:43 AM  Result Value Ref Range Status   Specimen Description BLOOD BLOOD LEFT HAND  Final   Special Requests   Final    BOTTLES DRAWN AEROBIC AND ANAEROBIC Blood Culture adequate volume   Culture   Final    NO GROWTH 2 DAYS Performed at [facility], [address] [place], [place] [address]    Report Status PENDING   Incomplete  MRSA PCR Screening     Status: None   Collection Time: 06/10/18  5:09 PM  Result Value Ref Range Status   MRSA by PCR NEGATIVE NEGATIVE Final    Comment:        The GeneXpert MRSA Assay (FDA approved for NASAL specimens only), is one component of a comprehensive MRSA colonization surveillance program. It is not intended to diagnose MRSA infection nor to guide or monitor treatment for MRSA infections. Performed at [facility], [place] [address], [place], [place] [address]   Culture, respiratory (non-expectorated)     Status: None (Preliminary result)   Collection Time: 06/10/18  5:16 PM  Result Value Ref Range Status   Specimen Description TRACHEAL ASPIRATE  Final   Special Requests   Final    NONE Performed at [facility], [place] [address] [place], [place] [address]    Gram Stain   Final    ABUNDANT WBC PRESENT, PREDOMINANTLY PMN FEW GRAM POSITIVE RODS RARE GRAM POSITIVE COCCI RARE GRAM NEGATIVE RODS    Culture FEW ACINETOBACTER CALCOACETICUS/BAUMANNII COMPLEX  Final   Report Status PENDING  Incomplete     Scheduled Meds: . apixaban  2.5 mg Per Tube BID  . carvedilol  6.25 mg Per Tube BID WC  . chlorhexidine gluconate (MEDLINE KIT)  15 mL Mouth Rinse BID  . Chlorhexidine Gluconate Cloth  6 each Topical Daily  . collagenase   Topical Daily  . diazepam  2.5 mg Per Tube Q12H  . docusate  100 mg Per Tube BID  . famotidine  20 mg Per Tube QHS  . feeding supplement (PRO-STAT SUGAR FREE 64)  30 mL Per Tube BID  . feeding supplement (VITAL HIGH PROTEIN)  1,000 mL Per Tube Q24H  . free water  200 mL Per Tube Q4H  . insulin aspart  0-9 Units Subcutaneous  Q4H  . levETIRAcetam  1,000 mg Per Tube BID  . mouth rinse  15 mL Mouth Rinse 10 times per day  . multivitamin  15 mL Per Tube Daily  . nutrition supplement (JUVEN)  1 packet Per Tube BID BM  . sodium chloride flush  3 mL Intravenous Q12H  . valproic acid  250 mg Per Tube BID   Continuous  Infusions: . sodium chloride 75 mL/hr at 06/12/18 0600  . meropenem (MERREM) IV Stopped (06/12/18 0244)     LOS: 2 days   Cherene Altes, MD [facility] Office  [phone] Pager - Text Page per Amion  If 7PM-7AM, please contact night-coverage per Amion 06/12/2018, 9:29 AM

## 2018-06-12 NOTE — Progress Notes (Signed)
Initial Nutrition Assessment  DOCUMENTATION CODES:   Not applicable  INTERVENTION:   To better meet estimated needs to promote healing, change TF:  Vital AF 1.2 at 65 ml/h (1560 ml per day)  Provides 1872 kcal, 117 gm protein, 1265 ml free water daily  Continue Juven BID via tube, provides CaHMB, glutamine, arginine, collagen protein, and micronutrients to promote wound healing  NUTRITION DIAGNOSIS:   Inadequate oral intake related to inability to eat as evidenced by NPO status.  GOAL:   Patient will meet greater than or equal to 90% of their needs  MONITOR:   TF tolerance, Labs, Vent status, Skin  REASON FOR ASSESSMENT:   Ventilator, Consult Enteral/tube feeding initiation and management  ASSESSMENT:   83 yo female with PMH of HTN, HLD, heart block, CHF, CAD, COPD, CVA, seizures, chronic VDRF, trach, PEG who was admitted from [facility] with fever, sepsis.   Received MD Consult for TF initiation and management. PEG in place. Currently receiving Vital High Protein at 40 ml/h with Pro-stat 30 ml BID. Tolerating well.   Sepsis etiology unclear, likely sacral / back wound or UTI.  Patient is currently intubated on ventilator support MV: 13 L/min Temp (24hrs), Avg:99.9 F (37.7 C), Min:97.8 F (36.6 C), Max:101.3 F (38.5 C)   Labs reviewed. Medications reviewed and include free water 200 ml every 4 hours, Novolog, MVI, Juven.   NUTRITION - FOCUSED PHYSICAL EXAM:    Most Recent Value  Orbital Region  No depletion  Upper Arm Region  No depletion  Thoracic and Lumbar Region  No depletion  Buccal Region  Moderate depletion  Temple Region  Moderate depletion  Clavicle Bone Region  No depletion  Clavicle and Acromion Bone Region  No depletion  Scapular Bone Region  Unable to assess  Dorsal Hand  No depletion  Patellar Region  No depletion  Anterior Thigh Region  No depletion  Posterior Calf Region  Unable to assess  Edema (RD Assessment)  Unable to assess   Hair  Reviewed  Eyes  Unable to assess  Mouth  Unable to assess  Skin  Reviewed  Nails  Reviewed       Diet Order:   Diet Order            Diet NPO time specified  Diet effective now              EDUCATION NEEDS:   No education needs have been identified at this time  Skin:  Skin Assessment: Skin Integrity Issues: Skin Integrity Issues:: Stage IV Stage IV: sacrum & vertebral column  Last BM:  2/3 (type 7)  Height:   Ht Readings from Last 1 Encounters:  06/10/18 5\' 4"  (1.626 m)    Weight:   Wt Readings from Last 1 Encounters:  06/10/18 77.1 kg    Ideal Body Weight:  54.5 kg  BMI:  Body mass index is 29.18 kg/m.  Estimated Nutritional Needs:   Kcal:  1800  Protein:  100-125 gm  Fluid:  1.8 L    Molli Barrows, RD, LDN, [place] Pager [phone] After Hours Pager [phone]

## 2018-06-12 NOTE — Consult Note (Signed)
[facility] Nurse wound consult note Reason for Consult: pressure injuries  From LTAC, no family in the room, patient not able to provide history  Wound type: Stage 4 pressure injury:Sacrum: 3cm x 2.5cm x 0.5cm  Stage 3 pressure injury:Vertebral column: 5cm x 5.5cm x 1.5cm   Pressure Injury POA: Yes  Measurement: see above   Wound bed: Sacrum: pale, pink, non granular, slick  Thoracic spine, pale, 80% pink/20% yellow slough at the proximal edge of the wound base, non granular   Drainage (amount, consistency, odor) moderate at each site, some greenish drainage noted from the spinal wound. No odor  Periwound: MARSI (medical adhesive related skin injury) thoracic spinal wound   Dressing procedure/placement/frequency:  Add hydrotherapy to the thoracic wound to clean up a bit.  Add Sanytl to this wound as well after therapy Progressa low air loss mattress in place while in the ICU. Will need air mattress upon DC from the ICU Silver hydrofiber packing to the sacral wound to manage exudate, address bioburdan.    [facility] Nurse team will follow with you and see patient within 10 days for wound assessments in conjunction with PT Please notify [facility] nurses of any acute changes in the wounds or any new areas of concern [place] MSN, [place], [place], [place]

## 2018-06-12 NOTE — Progress Notes (Signed)
Pharmacy Antibiotic Note  Melissa Cochran is a 83 y.o. female admitted from [facility] on 06/10/2018 with sepsis, source UTI vs. sacral wound. Patient has been on meropenem for h/o ESBL infection but continues to be febrile. Pharmacy has been consulted for vancomycin dosing. Temp 100.4 this AM, WBC down WNL. Scr 0.44 (at baseline), estimated CrCl ~56 mL/min.   Plan: Vancomycin 1250 mg IV q24h (estimated AUC 505, goal 400-550) Continue meropenem 1g IV q8h F/u clinical status, C&S, renal function, de-escalation, LOT, vancomycin levels as appropriate  Height: 5\' 4"  (162.6 cm) Weight: 170 lb (77.1 kg) IBW/kg (Calculated) : 54.7  Temp (24hrs), Avg:100.1 F (37.8 C), Min:97.8 F (36.6 C), Max:102.4 F (39.1 C)  Recent Labs  Lab 06/10/18 1141 06/10/18 1145 06/10/18 1308 06/10/18 1625 06/10/18 1941 06/11/18 0314 06/12/18 0207  WBC  --  16.5*  --   --   --  13.3* 7.6  CREATININE  --  0.57  --   --   --  0.46 0.44  LATICACIDVEN 2.7*  --  2.8* 1.6 1.7  --   --     Estimated Creatinine Clearance: 56.4 mL/min (by C-G formula based on SCr of 0.44 mg/dL).    No Known Allergies  Antimicrobials this admission: Vancomycin 2/1>>2/2; 2/3 >> Meropenem 2/1 >>  Microbiology results: 2/1BCx: NG x2 2/1 TA cx: few acinetobacter  2/1UCx: multiple species 2/1 RespiratoryPCR: negative 2/1 MRSA PCR: negative  Thank you for allowing pharmacy to be a part of this patient's care.  Mila Merry Gerarda Fraction, PharmD, [place] PGY2 Infectious Diseases Pharmacy Resident Phone: [phone] 06/12/2018 10:00 AM

## 2018-06-12 NOTE — Progress Notes (Signed)
Physical Therapy Wound Treatment Patient Details  Name: Melissa Cochran MRN: [id number] Date of Birth: [birth year]  Today's Date: 06/12/2018 Time: 4585-9292 Time Calculation (min): 28 min  Subjective  Subjective: Pt on vent Patient and Family Stated Goals: unable to state Date of Onset: (prior to admission) Prior Treatments: dressing changes  Pain Score:  No signs of pain  Wound Assessment  Pressure Injury 04/18/18 Stage IV - Full thickness tissue loss with exposed bone, tendon or muscle. (Active)  Wound Image   06/12/2018 12:53 PM  Dressing Type Gauze (Comment);Barrier Film (skin prep);ABD;Moist to moist 06/12/2018 12:53 PM  Dressing Changed;Clean;Dry;Intact 06/12/2018 12:53 PM  Dressing Change Frequency Daily 06/12/2018 12:53 PM  State of Healing Non-healing 06/12/2018 12:53 PM  Site / Wound Assessment Pink;Red;Yellow 06/12/2018 12:53 PM  % Wound base Red or Granulating 85% 06/12/2018 12:53 PM  % Wound base Yellow/Fibrinous Exudate 15% 06/12/2018 12:53 PM  % Wound base Black/Eschar 0% 06/12/2018 12:53 PM  % Wound base Other/Granulation Tissue (Comment) 0% 06/12/2018 12:53 PM  Peri-wound Assessment Erythema (blanchable) 06/12/2018 12:53 PM  Wound Length (cm) 5.2 cm 06/12/2018 12:53 PM  Wound Width (cm) 5 cm 06/12/2018 12:53 PM  Wound Depth (cm) 2 cm 06/12/2018 12:53 PM  Wound Surface Area (cm^2) 26 cm^2 06/12/2018 12:53 PM  Wound Volume (cm^3) 52 cm^3 06/12/2018 12:53 PM  Margins Unattached edges (unapproximated) 06/12/2018 12:53 PM  Drainage Amount Moderate 06/12/2018 12:53 PM  Drainage Description Purulent 06/12/2018 12:53 PM  Treatment Debridement (Selective);Hydrotherapy (Pulse lavage);Packing (Saline gauze) 06/12/2018 12:53 PM   Santyl applied to wound bed prior to applying dressing.    Hydrotherapy Pulsed lavage therapy - wound location: thoracic vertebral column Pulsed Lavage with Suction (psi): 8 psi Pulsed Lavage with Suction - Normal Saline Used: 1000 mL Pulsed Lavage Tip: Tip with splash  shield Selective Debridement Selective Debridement - Location: thoracic vertebral column Selective Debridement - Tools Used: Scissors;Forceps Selective Debridement - Tissue Removed: minimal yellow necrotic tissue   Wound Assessment and Plan  Wound Therapy - Assess/Plan/Recommendations Wound Therapy - Clinical Statement: Pt presents to hydrotherapy with vertebral wound. Can benefit from hydrotherapy to reduce bioburden and decrease necrotic tissue.  Wound Therapy - Functional Problem List: decr mobility Factors Delaying/Impairing Wound Healing: Infection - systemic/local;Immobility;Multiple medical problems;Polypharmacy Hydrotherapy Plan: Debridement;Dressing change;Patient/family education;Pulsatile lavage with suction Wound Therapy - Frequency: 6X / week Wound Therapy - Follow Up Recommendations: Other (comment)([facility]) Wound Plan: See above  Wound Therapy Goals- Improve the function of patient's integumentary system by progressing the wound(s) through the phases of wound healing (inflammation - proliferation - remodeling) by: Decrease Necrotic Tissue to: 5 Decrease Necrotic Tissue - Progress: Goal set today Increase Granulation Tissue to: 95 Increase Granulation Tissue - Progress: Goal set today Goals/treatment plan/discharge plan were made with and agreed upon by patient/family: No, Patient unable to participate in goals/treatment/discharge plan and family unavailable Time For Goal Achievement: 7 days Wound Therapy - Potential for Goals: Fair  Goals will be updated until maximal potential achieved or discharge criteria met.  Discharge criteria: when goals achieved, discharge from hospital, MD decision/surgical intervention, no progress towards goals, refusal/missing three consecutive treatments without notification or medical reason.  [address] [place] 06/12/2018, 1:14 PM [place] Pager [phone] Office [phone]

## 2018-06-12 NOTE — Progress Notes (Signed)
RN called because pt was hypotensive. Gave a 500cc bolus. After that, RN paged back because BP still in the 60s with MAPs in the 50s. NP to bedside. Upon arrival, RN has moved cuff around and now her BP is in the 105-120 range with MAP over 65. Pt's mental status at baseline is opening of eyes only. She is making some urine. She has dx of sepsis, but is on appropriate abx and other treatment. Temp 99. Not tachycardic. Respiratory status is stable. Will just monitor for now. KJKG, NP [facility]

## 2018-06-13 ENCOUNTER — Inpatient Hospital Stay (HOSPITAL_COMMUNITY): Payer: Medicare Other

## 2018-06-13 DIAGNOSIS — N3000 Acute cystitis without hematuria: Secondary | ICD-10-CM

## 2018-06-13 LAB — URINE CULTURE: Culture: NO GROWTH

## 2018-06-13 LAB — GLUCOSE, CAPILLARY
GLUCOSE-CAPILLARY: 113 mg/dL — AB (ref 70–99)
Glucose-Capillary: 93 mg/dL (ref 70–99)
Glucose-Capillary: 92 mg/dL (ref 70–99)
Glucose-Capillary: 102 mg/dL — ABNORMAL HIGH (ref 70–99)
Glucose-Capillary: 107 mg/dL — ABNORMAL HIGH (ref 70–99)

## 2018-06-13 LAB — CBC
HCT: 31 % — ABNORMAL LOW (ref 36.0–46.0)
Hemoglobin: 8.4 g/dL — ABNORMAL LOW (ref 12.0–15.0)
MCH: 25.4 pg — ABNORMAL LOW (ref 26.0–34.0)
MCHC: 27.1 g/dL — ABNORMAL LOW (ref 30.0–36.0)
MCV: 93.7 fL (ref 80.0–100.0)
Platelets: 289 10*3/uL (ref 150–400)
RBC: 3.31 MIL/uL — AB (ref 3.87–5.11)
RDW: 16.8 % — ABNORMAL HIGH (ref 11.5–15.5)
WBC: 7.8 10*3/uL (ref 4.0–10.5)
nRBC: 0.6 % — ABNORMAL HIGH (ref 0.0–0.2)

## 2018-06-13 LAB — COMPREHENSIVE METABOLIC PANEL
ALT: 13 U/L (ref 0–44)
AST: 17 U/L (ref 15–41)
Albumin: 2.1 g/dL — ABNORMAL LOW (ref 3.5–5.0)
Alkaline Phosphatase: 65 U/L (ref 38–126)
Anion gap: 5 (ref 5–15)
BUN: 39 mg/dL — ABNORMAL HIGH (ref 8–23)
CO2: 26 mmol/L (ref 22–32)
Calcium: 9.5 mg/dL (ref 8.9–10.3)
Chloride: 111 mmol/L (ref 98–111)
Creatinine, Ser: 0.36 mg/dL — ABNORMAL LOW (ref 0.44–1.00)
GFR calc non Af Amer: >60 mL/min (ref >60)
Glucose, Bld: 106 mg/dL — ABNORMAL HIGH (ref 70–99)
POTASSIUM: 3.6 mmol/L (ref 3.5–5.1)
Sodium: 142 mmol/L (ref 135–145)
Total Bilirubin: 0.8 mg/dL (ref 0.3–1.2)
Total Protein: 5.7 g/dL — ABNORMAL LOW (ref 6.5–8.1)

## 2018-06-13 LAB — CULTURE, RESPIRATORY W GRAM STAIN

## 2018-06-13 LAB — CULTURE, RESPIRATORY

## 2018-06-13 NOTE — Progress Notes (Signed)
[facility] TEAM 1 - Stepdown/ICU TEAM  SILVERIA BOTZ  PZW:[id number] DOB: 1935/11/15 DOA: 06/10/2018 PCP: [name], MD    Brief Narrative:  83yo F w/ a hx of chronic atrial fibrillation on apixaban, HTN, CAD s/p stent in 4235, chronic systolic CHF with EF of 36-14% 06/28/2017, nephrolithiasis complicated by hydronephrosis status post stent placement in 2017, CVA, seizure disorder, and chronic respiratory failure with ventilator and PEG dependence who presented from [facility] for fever. At baseline, on "good days" she reportedly opens her eyes, but does not otherwise communicate. In the ED she was noted to have a sacral ulcer with purulence. A CXR was unrevealing.   Significant Events: 2/1 admit from [facility]  Subjective: The pt had some "hypotension" last night, which was resolved w/ repositioning of her BP cuff. She is noncommunicative. She does not respond to my exam, and is not opening her eyes today. She does not appear to be in any pain at this time.   Assessment & Plan:  Sepsis of unclear etiology Respiratory virus panel negative - blood cultures remain negative - CXR without acute findings - procalcitonin not compelling - fever as high as 103.3 since admission, but temp curve appears to be trending down - continue empiric antibiotic for now - initial urine culture not helpful - likely candidates include UTI versus infected sacral/back wound - sepsis physiology improving - R arm PICC reportedly placed at [facility] on the day of her admit to [facility] (06/10/18) and appears benign on exam - C diff panel negative - repeat urine cx pending   Possible UTI - chronic Foley catheter Presence of chronic Foley makes urinalysis essentially useless - replaced Foley 2/2 w/ old foley being described as "very dirty" per RN - 1st urine culture not helpful - repeat urine culture 2/3 pending  - hx of ESBL E coli (sensitive to Imipenem) Feb 2019  Chronic hypoxic respiratory failure -chronic vent/trach  dependence Appears stable on ventilator via trach   Chronic atrial fibrillation Rate controlled - continue apixaban  Hypernatremia - dehydration Cont free water until BUN normal - Na has normalized  Normocytic anemia drop in hemoglobin since admission likely dilutional - no evidence of acute blood loss - Fe studies c/w severe Fe deficiency - loaded w/ IV Fe 2/3 - do not feel pt is an appropriate candidate for a GI eval at this time - follow for signs of blood loss   Recent Labs  Lab 06/10/18 1145 06/10/18 1210 06/11/18 0314 06/12/18 0207 06/13/18 0346  HGB 9.5* 10.5* 7.9* 7.6* 8.4*    Chronic systolic CHF EF 43-15% per TTE February 2019 - appears euvolemic at this time   [facility] Weights   06/10/18 1320 06/13/18 0346  Weight: 77.1 kg 74.9 kg    History of CVA Noncommunicative - exceedingly poor perceived quality of life  Seizure disorder No evidence of seizure activity  Decubitus ulcers  Stage 4 pressure injury:Sacrum: 3cm x 2.5cm x 0.5cm + Stage 3 pressure injury:Vertebral column: 5cm x 5.5cm x 1.5cm - care per [place] RN  Nutrition -PEG tube dependent Nutrition consulted  Torticollis  Goals of care Palliative Care was involved in the care of the patient during her last admit in December, at which time family insisted on ongoing full aggressive support (daugher was "okay with her mother not being able to [place], talk, walk, or even breathe on her own") - it appears to me that she has an exceedingly poor quality of life with an extremely low  likelihood of making a meaningful recovery  DVT prophylaxis: Apixaban Code Status: FULL CODE Family Communication: no family present at time of exam  Disposition Plan: ICU on chronic vent  Consultants:  PCCM  Antimicrobials:  Meropenem 2/1 > Cefepime 2/1  Vancomycin 2/1 + 2/3 >  Objective: Blood pressure 114/66, pulse 83, temperature 99.7 F (37.6 C), resp. rate 18, height _0  (1.626 m), weight 74.9 kg, SpO2 99  %.  Intake/Output Summary (Last 24 hours) at 06/13/2018 0830 Last data filed at 06/13/2018 6384 Gross per 24 hour  Intake 2527.28 ml  Output 930 ml  Net 1597.28 ml   Filed Weights   06/10/18 1320 06/13/18 0346  Weight: 77.1 kg 74.9 kg    Examination: General: No acute respiratory distress on vent via trach  Lungs: CTA th/o - no focal crackles - no wheezing   Cardiovascular: Irregularly irregular w/o M or rub  Abdomen: PEG insertion site clean and dry, soft, NT/ND Extremities: trace edema B LE   CBC: Recent Labs  Lab 06/10/18 1145  06/11/18 0314 06/12/18 0207 06/13/18 0346  WBC 16.5*  --  13.3* 7.6 7.8  NEUTROABS 14.8*  --   --   --   --   HGB 9.5*   < > 7.9* 7.6* 8.4*  HCT 36.8   < > 30.0* 29.5* 31.0*  MCV 96.6  --  95.5 95.5 93.7  PLT 365  --  316 278 289   < > = values in this interval not displayed.   Basic Metabolic Panel: Recent Labs  Lab 06/11/18 0314 06/12/18 0207 06/13/18 0346  NA 146* 145 142  K 4.4 3.7 3.6  CL 112* 111 111  CO2 _1 GLUCOSE 120* 98 106*  BUN 50* 52* 39*  CREATININE 0.46 0.44 0.36*  CALCIUM 9.5 9.3 9.5  MG  --  2.1  --   PHOS  --  2.5  --    GFR: Estimated Creatinine Clearance: 55.6 mL/min (A) (by C-G formula based on SCr of 0.36 mg/dL (L)).  Liver Function Tests: Recent Labs  Lab 06/10/18 1145 06/12/18 0207 06/13/18 0346  AST _2 ALT _3 ALKPHOS 72 56 65  BILITOT 0.5 0.7 0.8  PROT 6.6 5.3* 5.7*  ALBUMIN 2.4* 2.0* 2.1*    HbA1C: Hgb A1c MFr Bld  Date/Time Value Ref Range Status  02/13/2017 09:35 PM 5.1 4.8 - 5.6 % Final    Comment:    (NOTE) Pre diabetes:          5.7%-6.4% Diabetes:              >6.4% Glycemic control for   <7.0% adults with diabetes     CBG: Recent Labs  Lab 06/12/18 1512 06/12/18 1941 06/12/18 2329 06/13/18 0353 06/13/18 0806  GLUCAP 94 77 81 93 92    Recent Results (from the past 240 hour(s))  Blood Culture (routine x 2)     Status: None (Preliminary result)    Collection Time: 06/10/18 11:38 AM  Result Value Ref Range Status   Specimen Description BLOOD RIGHT ANTECUBITAL  Final   Special Requests   Final    BOTTLES DRAWN AEROBIC AND ANAEROBIC Blood Culture adequate volume   Culture   Final    NO GROWTH 3 DAYS Performed at [facility], [place] [address], [place], [place] [address]    Report Status PENDING  Incomplete  Urine culture     Status: Abnormal  Collection Time: 06/10/18 11:38 AM  Result Value Ref Range Status   Specimen Description URINE, RANDOM  Final   Special Requests   Final    Normal Performed at [facility], [place] [address], [place], [place] [address]    Culture MULTIPLE SPECIES PRESENT, SUGGEST RECOLLECTION (A)  Final   Report Status 06/11/2018 FINAL  Final  Respiratory Panel by PCR     Status: None   Collection Time: 06/10/18 11:39 AM  Result Value Ref Range Status   Adenovirus NOT DETECTED NOT DETECTED Final   Coronavirus 229E NOT DETECTED NOT DETECTED Final   Coronavirus HKU1 NOT DETECTED NOT DETECTED Final   Coronavirus NL63 NOT DETECTED NOT DETECTED Final   Coronavirus OC43 NOT DETECTED NOT DETECTED Final   Metapneumovirus NOT DETECTED NOT DETECTED Final   Rhinovirus / Enterovirus NOT DETECTED NOT DETECTED Final   Influenza A NOT DETECTED NOT DETECTED Final   Influenza B NOT DETECTED NOT DETECTED Final   Parainfluenza Virus 1 NOT DETECTED NOT DETECTED Final   Parainfluenza Virus 2 NOT DETECTED NOT DETECTED Final   Parainfluenza Virus 3 NOT DETECTED NOT DETECTED Final   Parainfluenza Virus 4 NOT DETECTED NOT DETECTED Final   Respiratory Syncytial Virus NOT DETECTED NOT DETECTED Final   Bordetella pertussis NOT DETECTED NOT DETECTED Final   Chlamydophila pneumoniae NOT DETECTED NOT DETECTED Final   Mycoplasma pneumoniae NOT DETECTED NOT DETECTED Final  Blood Culture (routine x 2)     Status: None (Preliminary result)   Collection Time: 06/10/18 11:43 AM  Result Value Ref Range Status   Specimen  Description BLOOD BLOOD LEFT HAND  Final   Special Requests   Final    BOTTLES DRAWN AEROBIC AND ANAEROBIC Blood Culture adequate volume   Culture   Final    NO GROWTH 3 DAYS Performed at [facility], [address] [place], [place] [address]    Report Status PENDING  Incomplete  MRSA PCR Screening     Status: None   Collection Time: 06/10/18  5:09 PM  Result Value Ref Range Status   MRSA by PCR NEGATIVE NEGATIVE Final    Comment:        The GeneXpert MRSA Assay (FDA approved for NASAL specimens only), is one component of a comprehensive MRSA colonization surveillance program. It is not intended to diagnose MRSA infection nor to guide or monitor treatment for MRSA infections. Performed at [facility], [place] [address], [place], [place] [address]   Culture, respiratory (non-expectorated)     Status: None (Preliminary result)   Collection Time: 06/10/18  5:16 PM  Result Value Ref Range Status   Specimen Description TRACHEAL ASPIRATE  Final   Special Requests NONE  Final   Gram Stain   Final    ABUNDANT WBC PRESENT, PREDOMINANTLY PMN FEW GRAM POSITIVE RODS RARE GRAM POSITIVE COCCI RARE GRAM NEGATIVE RODS    Culture FEW ACINETOBACTER CALCOACETICUS/BAUMANNII COMPLEX  Final   Report Status PENDING  Incomplete   Organism ID, Bacteria ACINETOBACTER CALCOACETICUS/BAUMANNII COMPLEX  Final      Susceptibility   Acinetobacter calcoaceticus/baumannii complex - MIC*    CEFTAZIDIME >=64 RESISTANT Resistant     CEFTRIAXONE >=64 RESISTANT Resistant     CIPROFLOXACIN >=4 RESISTANT Resistant     GENTAMICIN <=1 SENSITIVE Sensitive     IMIPENEM 1 SENSITIVE Sensitive     PIP/TAZO >=128 RESISTANT Resistant     TRIMETH/SULFA >=320 RESISTANT Resistant     CEFEPIME 16 INTERMEDIATE Intermediate  AMPICILLIN/SULBACTAM 4 SENSITIVE Sensitive     * FEW ACINETOBACTER CALCOACETICUS/BAUMANNII COMPLEX  C difficile quick scan w PCR reflex     Status: None   Collection Time: 06/12/18   6:25 PM  Result Value Ref Range Status   C Diff antigen NEGATIVE NEGATIVE Final   C Diff toxin NEGATIVE NEGATIVE Final   C Diff interpretation No C. difficile detected.  Final    Comment: Performed at [facility], [place] [address], [place], [place] [address]     Scheduled Meds: . apixaban  2.5 mg Per Tube BID  . carvedilol  6.25 mg Per Tube BID WC  . chlorhexidine gluconate (MEDLINE KIT)  15 mL Mouth Rinse BID  . Chlorhexidine Gluconate Cloth  6 each Topical Daily  . collagenase   Topical Daily  . diazepam  2.5 mg Per Tube Q12H  . famotidine  20 mg Per Tube QHS  . feeding supplement (VITAL HIGH PROTEIN)  1,000 mL Per Tube Q24H  . free water  200 mL Per Tube Q4H  . insulin aspart  0-9 Units Subcutaneous Q4H  . levETIRAcetam  1,000 mg Per Tube BID  . mouth rinse  15 mL Mouth Rinse 10 times per day  . multivitamin  15 mL Per Tube Daily  . nutrition supplement (JUVEN)  1 packet Per Tube BID BM  . sodium chloride flush  3 mL Intravenous Q12H  . valproic acid  250 mg Per Tube BID   Continuous Infusions: . sodium chloride Stopped (06/12/18 2228)  . feeding supplement (VITAL AF 1.2 CAL) 1,000 mL (06/12/18 1730)  . meropenem (MERREM) IV Stopped (06/13/18 0325)  . vancomycin Stopped (06/12/18 1419)     LOS: 3 days   Cherene Altes, MD [facility] Office  [phone] Pager - Text Page per Amion  If 7PM-7AM, please contact night-coverage per Amion 06/13/2018, 8:30 AM

## 2018-06-13 NOTE — Progress Notes (Signed)
Physical Therapy Wound Treatment Patient Details  Name: Melissa Cochran MRN: [id number] Date of Birth: 09/14/1935  Today's Date: 06/13/2018 Time: 2182-8833 Time Calculation (min): 25 min  Subjective  Subjective: Pt on vent Patient and Family Stated Goals: unable to state Date of Onset: (prior to admission) Prior Treatments: dressing changes  Pain Score:  Grimacing with turning and pulsatile lavage  Wound Assessment  Pressure Injury 04/18/18 Stage IV - Full thickness tissue loss with exposed bone, tendon or muscle. (Active)  Dressing Type Gauze (Comment);Barrier Film (skin prep);Moist to moist;Foam 06/13/2018  1:31 PM  Dressing Changed;Clean;Dry;Intact 06/13/2018  1:31 PM  Dressing Change Frequency Daily 06/13/2018  1:31 PM  State of Healing Non-healing 06/13/2018  1:31 PM  Site / Wound Assessment Pink;Red;Yellow 06/13/2018  1:31 PM  % Wound base Red or Granulating 85% 06/13/2018  1:31 PM  % Wound base Yellow/Fibrinous Exudate 10% 06/13/2018  1:31 PM  % Wound base Black/Eschar 0% 06/13/2018  1:31 PM  % Wound base Other/Granulation Tissue (Comment) 0% 06/13/2018  1:31 PM  Peri-wound Assessment Erythema (blanchable) 06/13/2018  1:31 PM  Wound Length (cm) 5.2 cm 06/12/2018 12:53 PM  Wound Width (cm) 5 cm 06/12/2018 12:53 PM  Wound Depth (cm) 2 cm 06/12/2018 12:53 PM  Wound Surface Area (cm^2) 26 cm^2 06/12/2018 12:53 PM  Wound Volume (cm^3) 52 cm^3 06/12/2018 12:53 PM  Margins Unattached edges (unapproximated) 06/13/2018  1:31 PM  Drainage Amount Moderate 06/13/2018  1:31 PM  Drainage Description Purulent;Serous 06/13/2018  1:31 PM  Treatment Hydrotherapy (Pulse lavage);Packing (Saline gauze) 06/13/2018  1:31 PM   Santyl applied to wound bed prior to applying dressing.    Hydrotherapy Pulsed lavage therapy - wound location: thoracic vertebral column Pulsed Lavage with Suction (psi): 8 psi Pulsed Lavage with Suction - Normal Saline Used: 1000 mL Pulsed Lavage Tip: Tip with splash shield Selective  Debridement Selective Debridement - Tissue Removed: Unable to remove any necrotic tissue   Wound Assessment and Plan  Wound Therapy - Assess/Plan/Recommendations Wound Therapy - Clinical Statement: Progressing with removal of necrotic tissue from wound.  Wound Therapy - Functional Problem List: decr mobility Factors Delaying/Impairing Wound Healing: Infection - systemic/local;Immobility;Multiple medical problems;Polypharmacy Hydrotherapy Plan: Debridement;Dressing change;Patient/family education;Pulsatile lavage with suction Wound Therapy - Frequency: 6X / week Wound Therapy - Follow Up Recommendations: Other (comment)([facility]) Wound Plan: See above  Wound Therapy Goals- Improve the function of patient's integumentary system by progressing the wound(s) through the phases of wound healing (inflammation - proliferation - remodeling) by: Decrease Necrotic Tissue to: 5 Decrease Necrotic Tissue - Progress: Progressing toward goal Increase Granulation Tissue to: 95 Increase Granulation Tissue - Progress: Progressing toward goal  Goals will be updated until maximal potential achieved or discharge criteria met.  Discharge criteria: when goals achieved, discharge from hospital, MD decision/surgical intervention, no progress towards goals, refusal/missing three consecutive treatments without notification or medical reason.  GP     [place] 06/13/2018, 1:36 PM [facility] Pager [phone] Office [phone]

## 2018-06-13 NOTE — Progress Notes (Signed)
NAME:  Melissa Cochran, MRN:  [id number], DOB:  09/22/1935, LOS: 3 ADMISSION DATE:  06/10/2018, CONSULTATION DATE: 06/10/2018 REFERRING MD: Dr. Ronnald Nian, CHIEF COMPLAINT: Fever  Brief History   Patient is unresponsive, chronic trach from [facility].  All history was obtained from the patient's medical record.  I attempted to call the patient's family at the numbers provided within the chart and there was no answer.  83 year old female history of stroke status post tracheostomy, PEG tube, chronically bedbound patient of [facility] nursing [facility].  Patient with chronic indwelling Foley catheter presented to the hospital tachycardic and febrile.  EKG initially revealed H fibrillation with RVR she has a known sacral wound.  Patient was placed on IV antibiotics vancomycin and cefepime.  Pulmonary was consulted for recommendations regarding chronic tracheostomy tube.  And whether or not the patient would need to be in the intensive care unit.  History of present illness   83 year old female past medical history of stroke status post tracheostomy, PEG tube, chronically bedbound admitted to from [facility] with concern of sepsis.  She was found to be febrile and tachycardic in atrial fibrillation with RVR.  She has a known sacral bed wound as well as chronic Foley.  She was given saline bolus in the ER Tylenol for fever and started on vancomycin and cefepime.  She is found to have a leukocytosis, mildly elevated lactic acid and urinalysis consistent with urinary tract infection.  Chest x-ray that was completed in the ER had no evidence of infiltrate or pneumonia.  She is currently on baseline vent settings on 21% FiO2.  Her blood pressure was stable at the time of this evaluation.  Map greater than 80.  Past Medical History   Past Medical History:  Diagnosis Date  . Adnexal mass   . Anxiety   . Arthritis   . Bladder tumor   . Bleeding ulcer   . CHF (congestive heart failure) ([place])   . CHF (congestive  heart failure) ([place])   . Chronic bronchitis ([place])   . Chronic respiratory failure ([place]) 04/19/2018  . COPD (chronic obstructive pulmonary disease) ([place])   . Coronary artery disease   . CVA (cerebral vascular accident) ([place]) 04/19/2018  . DVT (deep venous thrombosis) (HCC)    RLE  . GERD (gastroesophageal reflux disease)   . Heart block    left  . History of bleeding ulcers   . History of kidney stones   . Hyperlipemia   . Hypertension   . Myocardial infarction ([place])    "slight one" (07/05/2017)  . Pneumonia    "several times" (07/05/2017)  . Seizures ([place]) 02/2017 "several"; 06/26/2017 X 1  . Shortness of breath dyspnea   . Stroke [facility]) 02/13/2018   "still right sided weakness" (07/05/2017)     [facility] admission 05/11/2018  Consults:  Critical care  Procedures:  None  Significant Diagnostic Tests:  Lactic acid 2.8 down to 1.6  Micro Data:  04/21/2018 sputum culture: Pseudomonas aeruginosa 04/20/2018: MRSA PCR positive  06/10/2018: Blood cultures no growth to date 06/10/2018: Urine culture pending  Antimicrobials:  Cefepime Vancomycin  Interim history/subjective:  Please see HPI as above  Objective   Blood pressure (!) 110/58, pulse 83, temperature 99.7 F (37.6 C), resp. rate 18, height 5\' 4"  (1.626 m), weight 74.9 kg, SpO2 99 %.    Vent Mode: PRVC FiO2 (%):  [30 %] 30 % Set Rate:  [12 bmp] 12 bmp Vt Set:  [500 mL] 500  mL PEEP:  [5 cmH20] 5 cmH20 Plateau Pressure:  [21 cmH20-25 cmH20] 24 cmH20   Intake/Output Summary (Last 24 hours) at 06/13/2018 7902 Last data filed at 06/13/2018 4097 Gross per 24 hour  Intake 2412.23 ml  Output 905 ml  Net 1507.23 ml   Filed Weights   06/10/18 1320 06/13/18 0346  Weight: 77.1 kg 74.9 kg    Examination: General: Frail elderly disheveled female HEENT: Tracheostomy in place  Neuro: Does not respond to verbal stimulus  CV: Currently sinus rhythm intermittent atrial fibrillation PULM:  Decreased throughout DZ:HGDJ, non-tender, bsx4 active  Extremities: warm/dry, 2+ edema  Skin: Sacral wound no change   Resolved Hospital Problem list     Assessment & Plan:   Chronic hypoxemic respiratory failure requiring tracheostomy tube and mechanical ventilatory support - Continue current vent management she has a long-term trach and vent in place and does not weanable at this time.  Severe sepsis secondary to presumed urinary tract infection in the setting of chronic Foley - Continue current treatment per primary - Antimicrobial therapy per primary  History of atrial fibrillation however appears sinus on telemetry in the ED - Rate control per primary  History of chronic systolic heart failure -Prior echo in 2019 with EF 45 to 50% Elevated pulmonary pressures consistent with moderate pulmonary hypertension, peak PA pressures 56 mmHg -Heart failure treatment per primary  Sacral wound Per primary picture is available   History of seizures Per primary currently on Keppra and Depakote  Pulmonary critical care will see twice a week. Labs   CBC: Recent Labs  Lab 06/10/18 1145 06/10/18 1210 06/11/18 0314 06/12/18 0207 06/13/18 0346  WBC 16.5*  --  13.3* 7.6 7.8  NEUTROABS 14.8*  --   --   --   --   HGB 9.5* 10.5* 7.9* 7.6* 8.4*  HCT 36.8 31.0* 30.0* 29.5* 31.0*  MCV 96.6  --  95.5 95.5 93.7  PLT 365  --  316 278 242    Basic Metabolic Panel: Recent Labs  Lab 06/10/18 1145 06/10/18 1210 06/11/18 0314 06/12/18 0207 06/13/18 0346  NA 145 145 146* 145 142  K 4.6 4.5 4.4 3.7 3.6  CL 108  --  112* 111 111  CO2 26  --  26 26 26   GLUCOSE 190*  --  120* 98 106*  BUN 54*  --  50* 52* 39*  CREATININE 0.57  --  0.46 0.44 0.36*  CALCIUM 10.0  --  9.5 9.3 9.5  MG  --   --   --  2.1  --   PHOS  --   --   --  2.5  --    GFR: Estimated Creatinine Clearance: 55.6 mL/min (A) (by C-G formula based on SCr of 0.36 mg/dL (L)). Recent Labs  Lab 06/10/18 1141  06/10/18 1145 06/10/18 1308 06/10/18 1625 06/10/18 1941 06/11/18 0314 06/12/18 0207 06/13/18 0346  PROCALCITON  --  <0.10  --   --   --   --   --   --   WBC  --  16.5*  --   --   --  13.3* 7.6 7.8  LATICACIDVEN 2.7*  --  2.8* 1.6 1.7  --   --   --     Liver Function Tests: Recent Labs  Lab 06/10/18 1145 06/12/18 0207 06/13/18 0346  AST 21 17 17   ALT 17 12 13   ALKPHOS 72 56 65  BILITOT 0.5 0.7 0.8  PROT 6.6 5.3* 5.7*  ALBUMIN  2.4* 2.0* 2.1*   No results for input(s): LIPASE, AMYLASE in the last 168 hours. No results for input(s): AMMONIA in the last 168 hours.  ABG    Component Value Date/Time   PHART 7.458 (H) 06/10/2018 1210   PCO2ART 42.5 06/10/2018 1210   PO2ART 92.0 06/10/2018 1210   HCO3 29.4 (H) 06/10/2018 1210   TCO2 31 06/10/2018 1210   ACIDBASEDEF 2.0 06/26/2017 0210   O2SAT 96.0 06/10/2018 1210     Coagulation Profile: No results for input(s): INR, PROTIME in the last 168 hours.  Cardiac Enzymes: No results for input(s): CKTOTAL, CKMB, CKMBINDEX, TROPONINI in the last 168 hours.  HbA1C: Hgb A1c MFr Bld  Date/Time Value Ref Range Status  02/13/2017 09:35 PM 5.1 4.8 - 5.6 % Final    Comment:    (NOTE) Pre diabetes:          5.7%-6.4% Diabetes:              >6.4% Glycemic control for   <7.0% adults with diabetes     CBG: Recent Labs  Lab 06/12/18 1512 06/12/18 1941 06/12/18 2329 06/13/18 0353 06/13/18 0806  GLUCAP 94 77 81 93 92      Steve Minor ACNP Maryanna Shape PCCM Pager [phone] till 1 pm If no answer page [phone] 06/13/2018, 9:05 AM

## 2018-06-13 NOTE — Plan of Care (Signed)
  Problem: Clinical Measurements: Goal: Diagnostic test results will improve Outcome: Progressing Goal: Respiratory complications will improve Outcome: Progressing   Problem: Coping: Goal: Level of anxiety will decrease Outcome: Progressing   Problem: Pain Managment: Goal: General experience of comfort will improve Outcome: Progressing   Problem: Education: Goal: Knowledge of General Education information will improve Description Including pain rating scale, medication(s)/side effects and non-pharmacologic comfort measures Outcome: Not Progressing   Problem: Health Behavior/Discharge Planning: Goal: Ability to manage health-related needs will improve Outcome: Not Progressing   Problem: Activity: Goal: Risk for activity intolerance will decrease Outcome: Not Progressing

## 2018-06-14 LAB — COMPREHENSIVE METABOLIC PANEL
ALT: 14 U/L (ref 0–44)
AST: 15 U/L (ref 15–41)
Albumin: 1.9 g/dL — ABNORMAL LOW (ref 3.5–5.0)
Alkaline Phosphatase: 64 U/L (ref 38–126)
Anion gap: 8 (ref 5–15)
BUN: 35 mg/dL — ABNORMAL HIGH (ref 8–23)
CO2: 26 mmol/L (ref 22–32)
Calcium: 9.4 mg/dL (ref 8.9–10.3)
Chloride: 105 mmol/L (ref 98–111)
Creatinine, Ser: 0.38 mg/dL — ABNORMAL LOW (ref 0.44–1.00)
GFR calc Af Amer: >60 mL/min (ref >60)
GFR calc non Af Amer: >60 mL/min (ref >60)
Glucose, Bld: 106 mg/dL — ABNORMAL HIGH (ref 70–99)
Potassium: 4 mmol/L (ref 3.5–5.1)
SODIUM: 139 mmol/L (ref 135–145)
Total Bilirubin: 0.6 mg/dL (ref 0.3–1.2)
Total Protein: 5.4 g/dL — ABNORMAL LOW (ref 6.5–8.1)

## 2018-06-14 LAB — CBC
HCT: 28.7 % — ABNORMAL LOW (ref 36.0–46.0)
Hemoglobin: 7.8 g/dL — ABNORMAL LOW (ref 12.0–15.0)
MCH: 25.6 pg — ABNORMAL LOW (ref 26.0–34.0)
MCHC: 27.2 g/dL — ABNORMAL LOW (ref 30.0–36.0)
MCV: 94.1 fL (ref 80.0–100.0)
Platelets: 286 10*3/uL (ref 150–400)
RBC: 3.05 MIL/uL — ABNORMAL LOW (ref 3.87–5.11)
RDW: 17.1 % — ABNORMAL HIGH (ref 11.5–15.5)
WBC: 6.9 10*3/uL (ref 4.0–10.5)
nRBC: 0.7 % — ABNORMAL HIGH (ref 0.0–0.2)

## 2018-06-14 LAB — GLUCOSE, CAPILLARY
GLUCOSE-CAPILLARY: 99 mg/dL (ref 70–99)
Glucose-Capillary: 101 mg/dL — ABNORMAL HIGH (ref 70–99)
Glucose-Capillary: 113 mg/dL — ABNORMAL HIGH (ref 70–99)
Glucose-Capillary: 91 mg/dL (ref 70–99)
Glucose-Capillary: 93 mg/dL (ref 70–99)
Glucose-Capillary: 96 mg/dL (ref 70–99)

## 2018-06-14 MED ORDER — ASPIRIN 81 MG PO CHEW
81.0000 mg | CHEWABLE_TABLET | Freq: Every day | ORAL | Status: DC
  Administered 2018-06-14 – 2018-06-20 (×7): 81 mg via ORAL
  Filled 2018-06-14 (×7): qty 1

## 2018-06-14 MED ORDER — FUROSEMIDE 40 MG PO TABS
40.0000 mg | ORAL_TABLET | Freq: Every day | ORAL | Status: DC
  Administered 2018-06-14 – 2018-06-20 (×7): 40 mg via ORAL
  Filled 2018-06-14 (×7): qty 1

## 2018-06-14 NOTE — Progress Notes (Signed)
PROGRESS NOTE    Melissa Cochran  QMG:[id number] DOB: 1935-06-16 DOA: 06/10/2018 PCP: [name], MD  Brief Narrative: 83 year old with past medical history relevant for chronic atrial fibrillation on apixaban, hypertension, coronary artery disease status post stent in 8916, chronic systolic heart failure with EF of 45 to 50% by echo on 06/28/2017, nephrolithiasis complicated by hydronephrosis status post stent placement in 2017, history of bilateral hydronephrosis, CVA, seizure disorder complicated by chronic respiratory failure with ventilator dependence and PEG tube placement who was admitted from [facility] for sepsis of unclear source.   Assessment & Plan:   Principal Problem:   Sepsis ([place]) Active Problems:   Essential (primary) hypertension   Pressure injury of skin   Seizures (HCC)   A-fib (HCC)   Chronic respiratory failure (HCC)   #) Sepsis of unclear source: Currently the only culture data that is returned positive is a quite resistant Acinetobacter from her sputum cultures raising the suggestion that she might have tracheitis. - C. difficile negative, respiratory virus panel negative -Blood cultures 06/10/2018- -Urine culture 06/12/2018 after Foley was replaced negative - Sputum culture from 06/10/2018 growing out Acinetobacter [place] Unasyn, carb venoms -Continue to 06/10/2018  #) Seizure disorder: -Continue levetiracetam thousand milligrams twice daily by tube -Continue valproic acid 250 mg twice daily by tube  #) Chronic respiratory failure status post tracheostomy: -PCCM following, appreciate t recommendations  #) PEG tube dependence: Resolved with free water flushes and D5W -Continue free water flushes 200 mL's every 8 hours - Vital 1.2 @ 65 mL/hr with 30 mL Prostat BID  #) Chronic atrial fibrillation complicated by CVA: -Continue beta-blocker -Continue apixaban 2.5 mg twice daily  #) Hypertension/hyperlipidemia/coronary artery disease status post  stent: -Continue aspirin 81 mg -Continue carvedilol 6.25 mg twice daily  #) Anemia: This appears to be stable at this time.  Suspect most likely iron deficiency anemia and anemia of chronic disease.  #) Nephrolithiasis/hydronephrosis status post stents: Currently her creatinine is normal. -Low threshold to repeat CT abdomen   #) Chronic systolic heart failure: Last echo in February showed EF of 40 to 45% -Continue furosemide 40 mg daily via tube  Fluids: Free water flushes Electrolytes: Per above Nutrition: Per above  Prophylaxis: Apixaban  Disposition: Pending  resolution of sepsis physiology  Full code   Consultants:   PCCM  Procedures:   None  Antimicrobials:  IV meropenem started 06/10/2018   Subjective: Patient is nonresponsive to stimuli.  She does not appear to be uncomfortable.  Objective: Vitals:   06/14/18 0600 06/14/18 0700 06/14/18 0800 06/14/18 0809  BP: 110/67 105/66 113/75 113/75  Pulse: 77 77 79 74  Resp: _0 Temp: 99.3 F (37.4 C) 99.5 F (37.5 C) 99.7 F (37.6 C)   TempSrc:      SpO2: 100% 100% 100% 100%  Weight:      Height:        Intake/Output Summary (Last 24 hours) at 06/14/2018 0944 Last data filed at 06/14/2018 0600 Gross per 24 hour  Intake 2808.31 ml  Output 820 ml  Net 1988.31 ml   Filed Weights   06/10/18 1320 06/13/18 0346 06/14/18 0427  Weight: 77.1 kg 74.9 kg 75.6 kg    Examination:  General exam: No acute distress Respiratory system: Anterior lung sounds with scattered rhonchi Cardiovascular system: Distant heart sounds, regular rate and rhythm, no murmurs Gastrointestinal system: Soft, nondistended, no rebound or guarding, plus bowel sounds Central nervous system: Awake and appears to be oriented  though cannot assess completely, left-sided hemiparesis, squeezes right hand and wiggle right feet, torticollis towards right side noted Extremities: Lower extremity edema Skin: Tracheostomy site is clean  dry and intact, PEG tube site is clean dry and intact Psychiatry: Unable to assess due to medical condition.     Data Reviewed: I have personally reviewed following labs and imaging studies  CBC: Recent Labs  Lab 06/10/18 1145 06/10/18 1210 06/11/18 0314 06/12/18 0207 06/13/18 0346 06/14/18 0423  WBC 16.5*  --  13.3* 7.6 7.8 6.9  NEUTROABS 14.8*  --   --   --   --   --   HGB 9.5* 10.5* 7.9* 7.6* 8.4* 7.8*  HCT 36.8 31.0* 30.0* 29.5* 31.0* 28.7*  MCV 96.6  --  95.5 95.5 93.7 94.1  PLT 365  --  316 278 289 097   Basic Metabolic Panel: Recent Labs  Lab 06/10/18 1145 06/10/18 1210 06/11/18 0314 06/12/18 0207 06/13/18 0346 06/14/18 0423  NA 145 145 146* 145 142 139  K 4.6 4.5 4.4 3.7 3.6 4.0  CL 108  --  112* 111 111 105  CO2 26  --  _0 GLUCOSE 190*  --  120* 98 106* 106*  BUN 54*  --  50* 52* 39* 35*  CREATININE 0.57  --  0.46 0.44 0.36* 0.38*  CALCIUM 10.0  --  9.5 9.3 9.5 9.4  MG  --   --   --  2.1  --   --   PHOS  --   --   --  2.5  --   --    GFR: Estimated Creatinine Clearance: 55.9 mL/min (A) (by C-G formula based on SCr of 0.38 mg/dL (L)). Liver Function Tests: Recent Labs  Lab 06/10/18 1145 06/12/18 0207 06/13/18 0346 06/14/18 0423  AST _1 ALT _2 ALKPHOS 72 56 65 64  BILITOT 0.5 0.7 0.8 0.6  PROT 6.6 5.3* 5.7* 5.4*  ALBUMIN 2.4* 2.0* 2.1* 1.9*   No results for input(s): LIPASE, AMYLASE in the last 168 hours. No results for input(s): AMMONIA in the last 168 hours. Coagulation Profile: No results for input(s): INR, PROTIME in the last 168 hours. Cardiac Enzymes: No results for input(s): CKTOTAL, CKMB, CKMBINDEX, TROPONINI in the last 168 hours. BNP (last 3 results) No results for input(s): PROBNP in the last 8760 hours. HbA1C: No results for input(s): HGBA1C in the last 72 hours. CBG: Recent Labs  Lab 06/13/18 1509 06/13/18 1931 06/14/18 0002 06/14/18 0349 06/14/18 0731  GLUCAP 107* 113* 96 99 91   Lipid  Profile: No results for input(s): CHOL, HDL, LDLCALC, TRIG, CHOLHDL, LDLDIRECT in the last 72 hours. Thyroid Function Tests: No results for input(s): TSH, T4TOTAL, FREET4, T3FREE, THYROIDAB in the last 72 hours. Anemia Panel: Recent Labs    06/12/18 0207 06/12/18 0208  VITAMINB12 790  --   FOLATE  --  21.8  FERRITIN 34  --   TIBC 393  --   IRON 14*  --   RETICCTPCT 1.9  --    Sepsis Labs: Recent Labs  Lab 06/10/18 1141 06/10/18 1145 06/10/18 1308 06/10/18 1625 06/10/18 1941  PROCALCITON  --  <0.10  --   --   --   LATICACIDVEN 2.7*  --  2.8* 1.6 1.7    Recent Results (from the past 240 hour(s))  Blood Culture (routine x 2)     Status: None (Preliminary result)   Collection Time: 06/10/18  11:38 AM  Result Value Ref Range Status   Specimen Description BLOOD RIGHT ANTECUBITAL  Final   Special Requests   Final    BOTTLES DRAWN AEROBIC AND ANAEROBIC Blood Culture adequate volume   Culture   Final    NO GROWTH 3 DAYS Performed at [facility], [address], [place], [place] [address]    Report Status PENDING  Incomplete  Urine culture     Status: Abnormal   Collection Time: 06/10/18 11:38 AM  Result Value Ref Range Status   Specimen Description URINE, RANDOM  Final   Special Requests   Final    Normal Performed at [facility], [place] [address], [place], [place] [address]    Culture MULTIPLE SPECIES PRESENT, SUGGEST RECOLLECTION (A)  Final   Report Status 06/11/2018 FINAL  Final  Respiratory Panel by PCR     Status: None   Collection Time: 06/10/18 11:39 AM  Result Value Ref Range Status   Adenovirus NOT DETECTED NOT DETECTED Final   Coronavirus 229E NOT DETECTED NOT DETECTED Final   Coronavirus HKU1 NOT DETECTED NOT DETECTED Final   Coronavirus NL63 NOT DETECTED NOT DETECTED Final   Coronavirus OC43 NOT DETECTED NOT DETECTED Final   Metapneumovirus NOT DETECTED NOT DETECTED Final   Rhinovirus / Enterovirus NOT DETECTED NOT DETECTED Final   Influenza A  NOT DETECTED NOT DETECTED Final   Influenza B NOT DETECTED NOT DETECTED Final   Parainfluenza Virus 1 NOT DETECTED NOT DETECTED Final   Parainfluenza Virus 2 NOT DETECTED NOT DETECTED Final   Parainfluenza Virus 3 NOT DETECTED NOT DETECTED Final   Parainfluenza Virus 4 NOT DETECTED NOT DETECTED Final   Respiratory Syncytial Virus NOT DETECTED NOT DETECTED Final   Bordetella pertussis NOT DETECTED NOT DETECTED Final   Chlamydophila pneumoniae NOT DETECTED NOT DETECTED Final   Mycoplasma pneumoniae NOT DETECTED NOT DETECTED Final  Blood Culture (routine x 2)     Status: None (Preliminary result)   Collection Time: 06/10/18 11:43 AM  Result Value Ref Range Status   Specimen Description BLOOD BLOOD LEFT HAND  Final   Special Requests   Final    BOTTLES DRAWN AEROBIC AND ANAEROBIC Blood Culture adequate volume   Culture   Final    NO GROWTH 3 DAYS Performed at [facility], [address], [place], [place] [address]    Report Status PENDING  Incomplete  MRSA PCR Screening     Status: None   Collection Time: 06/10/18  5:09 PM  Result Value Ref Range Status   MRSA by PCR NEGATIVE NEGATIVE Final    Comment:        The GeneXpert MRSA Assay (FDA approved for NASAL specimens only), is one component of a comprehensive MRSA colonization surveillance program. It is not intended to diagnose MRSA infection nor to guide or monitor treatment for MRSA infections. Performed at [facility], [place] [address], [place],  [address]   Culture, respiratory (non-expectorated)     Status: None   Collection Time: 06/10/18  5:16 PM  Result Value Ref Range Status   Specimen Description TRACHEAL ASPIRATE  Final   Special Requests   Final    NONE Performed at [facility], [place] [address], [place], [place] [address]    Gram Stain   Final    ABUNDANT WBC PRESENT, PREDOMINANTLY PMN FEW GRAM POSITIVE RODS RARE GRAM POSITIVE COCCI RARE GRAM NEGATIVE RODS    Culture FEW  ACINETOBACTER CALCOACETICUS/BAUMANNII COMPLEX  Final   Report Status 06/13/2018 FINAL  Final   Organism ID, Bacteria ACINETOBACTER CALCOACETICUS/BAUMANNII COMPLEX  Final      Susceptibility   Acinetobacter calcoaceticus/baumannii complex - MIC*    CEFTAZIDIME >=64 RESISTANT Resistant     CEFTRIAXONE >=64 RESISTANT Resistant     CIPROFLOXACIN >=4 RESISTANT Resistant     GENTAMICIN <=1 SENSITIVE Sensitive     IMIPENEM 1 SENSITIVE Sensitive     PIP/TAZO >=128 RESISTANT Resistant     TRIMETH/SULFA >=320 RESISTANT Resistant     CEFEPIME 16 INTERMEDIATE Intermediate     AMPICILLIN/SULBACTAM 4 SENSITIVE Sensitive     * FEW ACINETOBACTER CALCOACETICUS/BAUMANNII COMPLEX  C difficile quick scan w PCR reflex     Status: None   Collection Time: 06/12/18  6:25 PM  Result Value Ref Range Status   C Diff antigen NEGATIVE NEGATIVE Final   C Diff toxin NEGATIVE NEGATIVE Final   C Diff interpretation No C. difficile detected.  Final    Comment: Performed at [facility], [place] [address] [place], [place] [address]  Urine Culture     Status: None   Collection Time: 06/12/18  6:25 PM  Result Value Ref Range Status   Specimen Description URINE, CATHETERIZED  Final   Special Requests NONE  Final   Culture   Final    NO GROWTH Performed at [facility], [address] [place], [place] [address]    Report Status 06/13/2018 FINAL  Final         Radiology Studies: Dg Chest Port 1 View  Result Date: 06/13/2018 CLINICAL DATA:  Fever of unknown origin EXAM: PORTABLE CHEST 1 VIEW COMPARISON:  Three days ago FINDINGS: Tracheostomy tube in place. Right upper extremity PICC with tip at the upper cavoatrial junction. Low volume chest with haziness and mild left-sided streaky density. There may be layering pleural fluid on the left. No pneumothorax. Percutaneous gastrostomy tube. IMPRESSION: 1. Stable hardware positioning. 2. Low volume chest with stable mild hazy density favoring atelectasis.  Electronically Signed   By: Monte Fantasia M.D.   On: 06/13/2018 06:32        Scheduled Meds: . apixaban  2.5 mg Per Tube BID  . carvedilol  6.25 mg Per Tube BID WC  . chlorhexidine gluconate (MEDLINE KIT)  15 mL Mouth Rinse BID  . Chlorhexidine Gluconate Cloth  6 each Topical Daily  . collagenase   Topical Daily  . diazepam  2.5 mg Per Tube Q12H  . famotidine  20 mg Per Tube QHS  . free water  200 mL Per Tube Q4H  . insulin aspart  0-9 Units Subcutaneous Q4H  . levETIRAcetam  1,000 mg Per Tube BID  . mouth rinse  15 mL Mouth Rinse 10 times per day  . multivitamin  15 mL Per Tube Daily  . nutrition supplement (JUVEN)  1 packet Per Tube BID BM  . sodium chloride flush  3 mL Intravenous Q12H  . valproic acid  250 mg Per Tube BID   Continuous Infusions: . sodium chloride 10 mL/hr at 06/13/18 0915  . feeding supplement (VITAL AF 1.2 CAL) 1,000 mL (06/12/18 1730)  . meropenem (MERREM) IV 1 g (06/14/18 0139)  . vancomycin 166.7 mL/hr at 06/13/18 1149     LOS: 4 days    Time spent: [place], MD [facility]  If 7PM-7AM, please contact night-coverage [email or web address] Password [facility] 06/14/2018, 9:44 AM

## 2018-06-14 NOTE — Progress Notes (Signed)
Physical Therapy Wound Treatment Patient Details  Name: Melissa Cochran MRN: [id number] Date of Birth: 12-08-1935  Today's Date: 06/14/2018 Time: 5277-8242 Time Calculation (min): 28 min  Subjective  Subjective: Pt on vent Patient and Family Stated Goals: unable to state Date of Onset: (prior to admission) Prior Treatments: dressing changes  Pain Score:  Grimacing with positioning; Pre medicated  Wound Assessment  Pressure Injury 04/18/18 Stage IV - Full thickness tissue loss with exposed bone, tendon or muscle. (Active)  Dressing Type Gauze (Comment);Barrier Film (skin prep);Moist to moist;Foam 06/14/2018 11:58 AM  Dressing Changed;Clean;Dry;Intact 06/14/2018 11:58 AM  Dressing Change Frequency Daily 06/14/2018 11:58 AM  State of Healing Non-healing 06/14/2018 11:58 AM  Site / Wound Assessment Pink;Red;Yellow 06/14/2018 11:58 AM  % Wound base Red or Granulating 85% 06/14/2018 11:58 AM  % Wound base Yellow/Fibrinous Exudate 15% 06/14/2018 11:58 AM  % Wound base Black/Eschar 0% 06/14/2018 11:58 AM  % Wound base Other/Granulation Tissue (Comment) 0% 06/14/2018 11:58 AM  Peri-wound Assessment Erythema (blanchable) 06/14/2018 11:58 AM  Wound Length (cm) 5.2 cm 06/12/2018 12:53 PM  Wound Width (cm) 5 cm 06/12/2018 12:53 PM  Wound Depth (cm) 2 cm 06/12/2018 12:53 PM  Wound Surface Area (cm^2) 26 cm^2 06/12/2018 12:53 PM  Wound Volume (cm^3) 52 cm^3 06/12/2018 12:53 PM  Margins Unattached edges (unapproximated) 06/14/2018 11:58 AM  Drainage Amount Minimal 06/14/2018 11:58 AM  Drainage Description Purulent;Serous 06/14/2018 11:58 AM  Treatment Debridement (Selective);Hydrotherapy (Pulse lavage);Packing (Saline gauze) 06/14/2018 11:58 AM     Santyl applied to wound bed prior to applying dressing.   Hydrotherapy Pulsed lavage therapy - wound location: thoracic vertebral column Pulsed Lavage with Suction (psi): 8 psi Pulsed Lavage with Suction - Normal Saline Used: 1000 mL Pulsed Lavage Tip: Tip with splash  shield Selective Debridement Selective Debridement - Location: thoracic vertebral column Selective Debridement - Tools Used: Scissors;Forceps Selective Debridement - Tissue Removed: minimal removal of yellow slough   Wound Assessment and Plan  Wound Therapy - Assess/Plan/Recommendations Wound Therapy - Clinical Statement: Slow removal of necrotic tissue. Continue hydrotherapy for removal of nonviable tissue. Wound Therapy - Functional Problem List: decr mobility Factors Delaying/Impairing Wound Healing: Infection - systemic/local;Immobility;Multiple medical problems;Polypharmacy Hydrotherapy Plan: Debridement;Dressing change;Patient/family education;Pulsatile lavage with suction Wound Therapy - Frequency: 6X / week Wound Therapy - Follow Up Recommendations: Other (comment)([facility]) Wound Plan: See above  Wound Therapy Goals- Improve the function of patient's integumentary system by progressing the wound(s) through the phases of wound healing (inflammation - proliferation - remodeling) by: Decrease Necrotic Tissue to: 5 Decrease Necrotic Tissue - Progress: Progressing toward goal Increase Granulation Tissue to: 95 Increase Granulation Tissue - Progress: Progressing toward goal Goals/treatment plan/discharge plan were made with and agreed upon by patient/family: No, Patient unable to participate in goals/treatment/discharge plan and family unavailable Time For Goal Achievement: 7 days Wound Therapy - Potential for Goals: Fair  Goals will be updated until maximal potential achieved or discharge criteria met.  Discharge criteria: when goals achieved, discharge from hospital, MD decision/surgical intervention, no progress towards goals, refusal/missing three consecutive treatments without notification or medical reason.  GP    Ellamae Sia, PT, DPT Acute Rehabilitation Services Pager [phone] Office [phone]   Willy Eddy 06/14/2018, 12:04 PM

## 2018-06-15 LAB — CULTURE, BLOOD (ROUTINE X 2)
Culture: NO GROWTH
Culture: NO GROWTH
Special Requests: ADEQUATE
Special Requests: ADEQUATE

## 2018-06-15 LAB — GLUCOSE, CAPILLARY
Glucose-Capillary: 101 mg/dL — ABNORMAL HIGH (ref 70–99)
Glucose-Capillary: 103 mg/dL — ABNORMAL HIGH (ref 70–99)
Glucose-Capillary: 111 mg/dL — ABNORMAL HIGH (ref 70–99)
Glucose-Capillary: 114 mg/dL — ABNORMAL HIGH (ref 70–99)
Glucose-Capillary: 87 mg/dL (ref 70–99)
Glucose-Capillary: 91 mg/dL (ref 70–99)

## 2018-06-15 LAB — CBC
HCT: 31.2 % — ABNORMAL LOW (ref 36.0–46.0)
Hemoglobin: 8.5 g/dL — ABNORMAL LOW (ref 12.0–15.0)
MCH: 25.5 pg — ABNORMAL LOW (ref 26.0–34.0)
MCHC: 27.2 g/dL — ABNORMAL LOW (ref 30.0–36.0)
MCV: 93.7 fL (ref 80.0–100.0)
Platelets: 303 10*3/uL (ref 150–400)
RBC: 3.33 MIL/uL — ABNORMAL LOW (ref 3.87–5.11)
RDW: 18.1 % — ABNORMAL HIGH (ref 11.5–15.5)
WBC: 9.5 10*3/uL (ref 4.0–10.5)
nRBC: 0.7 % — ABNORMAL HIGH (ref 0.0–0.2)

## 2018-06-15 LAB — BASIC METABOLIC PANEL
Anion gap: 10 (ref 5–15)
CO2: 26 mmol/L (ref 22–32)
Chloride: 102 mmol/L (ref 98–111)
Creatinine, Ser: 0.39 mg/dL — ABNORMAL LOW (ref 0.44–1.00)
GFR calc Af Amer: >60 mL/min (ref >60)
GFR calc non Af Amer: >60 mL/min (ref >60)
Glucose, Bld: 114 mg/dL — ABNORMAL HIGH (ref 70–99)
Potassium: 4.4 mmol/L (ref 3.5–5.1)
Sodium: 138 mmol/L (ref 135–145)

## 2018-06-15 LAB — VANCOMYCIN, TROUGH: Vancomycin Tr: 10 ug/mL — ABNORMAL LOW (ref 15–20)

## 2018-06-15 LAB — BASIC METABOLIC PANEL WITH GFR
BUN: 36 mg/dL — ABNORMAL HIGH (ref 8–23)
Calcium: 9.5 mg/dL (ref 8.9–10.3)

## 2018-06-15 LAB — PROCALCITONIN: Procalcitonin: <0.1 ng/mL

## 2018-06-15 NOTE — Care Management (Signed)
Pt is not from [facility] - she is from [facility].  CM checked with [facility] - pt does not have LTACH days currently and therefore pt will need to return to SNF.  CSW following

## 2018-06-15 NOTE — Progress Notes (Signed)
CSW aware that pt is from the SNF portion of [facility]. CSW following for further discharge needs. CSW spoke with Erline Levine from [facility] and was made aware that at this time they do not have any beds available. Erline Levine expressed that she would follow up with CSW once beds are available.     Virgie Dad Leeba Barbe, MSW, [place] [facility] Clinical Social Worker [phone]

## 2018-06-15 NOTE — Progress Notes (Signed)
PROGRESS NOTE    Melissa Cochran  GNO:[id number] DOB: Jun 26, 1935 DOA: 06/10/2018 PCP: [name], MD  Brief Narrative: 83 year old with past medical history relevant for chronic atrial fibrillation on apixaban, hypertension, coronary artery disease status post stent in 1694, chronic systolic heart failure with EF of 45 to 50% by echo on 06/28/2017, nephrolithiasis complicated by hydronephrosis status post stent placement in 2017, history of bilateral hydronephrosis, CVA, seizure disorder complicated by chronic respiratory failure with ventilator dependence and PEG tube placement who was admitted from [facility] for sepsis of unclear source.   Assessment & Plan:   Principal Problem:   Sepsis ([place]) Active Problems:   Essential (primary) hypertension   Pressure injury of skin   Seizures (HCC)   A-fib (HCC)   Chronic respiratory failure (HCC)   #) Sepsis of unclear source: Patient continues to have low-grade fevers of unclear source -Procalcitonin undetectable - C. difficile negative, respiratory virus panel negative -Blood cultures 06/10/2018- -Urine culture 06/12/2018 after Foley was replaced negative - Sputum culture from 06/10/2018 growing out Acinetobacter  -Continue IV meropenem 06/10/2018  #) Seizure disorder: -Continue levetiracetam thousand milligrams twice daily by tube -Continue valproic acid 250 mg twice daily by tube  #) Chronic respiratory failure status post tracheostomy: -PCCM following, appreciate t recommendations  #) PEG tube dependence: Resolved with free water flushes  -Continue free water flushes 200 mL's every 8 hours - Vital 1.2 @ 65 mL/hr with 30 mL Prostat BID  #) Chronic atrial fibrillation complicated by CVA: -Continue beta-blocker -Continue apixaban 2.5 mg twice daily  #) Hypertension/hyperlipidemia/coronary artery disease status post stent: -Continue aspirin 81 mg -Continue carvedilol 6.25 mg twice daily  #) Anemia: This appears to be  stable at this time.  Suspect most likely iron deficiency anemia and anemia of chronic disease.  #) Nephrolithiasis/hydronephrosis status post stents: Currently her creatinine is normal. -Low threshold to repeat CT abdomen   #) Chronic systolic heart failure: Last echo in February showed EF of 40 to 45% -Continue furosemide 40 mg daily via tube  Fluids: Free water flushes Electrolytes: Per above Nutrition: Per above  Prophylaxis: Apixaban  Disposition: Pending  resolution of fever  Full code   Consultants:   PCCM  Procedures:   None  Antimicrobials:  IV meropenem started 06/10/2018   Subjective: Patient is nonresponsive to stimuli.  She does not appear to be uncomfortable.  Objective: Vitals:   06/15/18 0500 06/15/18 0600 06/15/18 0700 06/15/18 0856  BP: 102/60 106/66 114/61 122/73  Pulse: 81 78 84 85  Resp: '14 14 18 16  '$ Temp: 100.2 F (37.9 C) 99.5 F (37.5 C) 99 F (37.2 C)   TempSrc:      SpO2: 97% 97% 98% 98%  Weight: 77.6 kg     Height:        Intake/Output Summary (Last 24 hours) at 06/15/2018 1003 Last data filed at 06/15/2018 5038 Gross per 24 hour  Intake 2453.33 ml  Output 2115 ml  Net 338.33 ml   Filed Weights   06/13/18 0346 06/14/18 0427 06/15/18 0500  Weight: 74.9 kg 75.6 kg 77.6 kg    Examination:  General exam: No acute distress Respiratory system: Anterior lung sounds with scattered rhonchi Cardiovascular system: Distant heart sounds, regular rate and rhythm, no murmurs Gastrointestinal system: Soft, nondistended, no rebound or guarding, plus bowel sounds Central nervous system: Awake and appears to be oriented though cannot assess completely, left-sided hemiparesis, squeezes right hand and wiggle right feet, torticollis towards right side noted  Extremities: Lower extremity edema Skin: Tracheostomy site is clean dry and intact, PEG tube site is clean dry and intact Psychiatry: Unable to assess due to medical condition.      Data Reviewed: I have personally reviewed following labs and imaging studies  CBC: Recent Labs  Lab 06/10/18 1145  06/11/18 0314 06/12/18 0207 06/13/18 0346 06/14/18 0423 06/15/18 0411  WBC 16.5*  --  13.3* 7.6 7.8 6.9 9.5  NEUTROABS 14.8*  --   --   --   --   --   --   HGB 9.5*   < > 7.9* 7.6* 8.4* 7.8* 8.5*  HCT 36.8   < > 30.0* 29.5* 31.0* 28.7* 31.2*  MCV 96.6  --  95.5 95.5 93.7 94.1 93.7  PLT 365  --  316 278 289 286 303   < > = values in this interval not displayed.   Basic Metabolic Panel: Recent Labs  Lab 06/11/18 0314 06/12/18 0207 06/13/18 0346 06/14/18 0423 06/15/18 0411  NA 146* 145 142 139 138  K 4.4 3.7 3.6 4.0 4.4  CL 112* 111 111 105 102  CO2 '26 26 26 26 26  '$ GLUCOSE 120* 98 106* 106* 114*  BUN 50* 52* 39* 35* 36*  CREATININE 0.46 0.44 0.36* 0.38* 0.39*  CALCIUM 9.5 9.3 9.5 9.4 9.5  MG  --  2.1  --   --   --   PHOS  --  2.5  --   --   --    GFR: Estimated Creatinine Clearance: 56.6 mL/min (A) (by C-G formula based on SCr of 0.39 mg/dL (L)). Liver Function Tests: Recent Labs  Lab 06/10/18 1145 06/12/18 0207 06/13/18 0346 06/14/18 0423  AST '21 17 17 15  '$ ALT '17 12 13 14  '$ ALKPHOS 72 56 65 64  BILITOT 0.5 0.7 0.8 0.6  PROT 6.6 5.3* 5.7* 5.4*  ALBUMIN 2.4* 2.0* 2.1* 1.9*   No results for input(s): LIPASE, AMYLASE in the last 168 hours. No results for input(s): AMMONIA in the last 168 hours. Coagulation Profile: No results for input(s): INR, PROTIME in the last 168 hours. Cardiac Enzymes: No results for input(s): CKTOTAL, CKMB, CKMBINDEX, TROPONINI in the last 168 hours. BNP (last 3 results) No results for input(s): PROBNP in the last 8760 hours. HbA1C: No results for input(s): HGBA1C in the last 72 hours. CBG: Recent Labs  Lab 06/14/18 1545 06/14/18 1918 06/14/18 2333 06/15/18 0344 06/15/18 0733  GLUCAP 101* 93 101* 87 103*   Lipid Profile: No results for input(s): CHOL, HDL, LDLCALC, TRIG, CHOLHDL, LDLDIRECT in the last 72  hours. Thyroid Function Tests: No results for input(s): TSH, T4TOTAL, FREET4, T3FREE, THYROIDAB in the last 72 hours. Anemia Panel: No results for input(s): VITAMINB12, FOLATE, FERRITIN, TIBC, IRON, RETICCTPCT in the last 72 hours. Sepsis Labs: Recent Labs  Lab 06/10/18 1141 06/10/18 1145 06/10/18 1308 06/10/18 1625 06/10/18 1941 06/15/18 0411  PROCALCITON  --  <0.10  --   --   --  <0.10  LATICACIDVEN 2.7*  --  2.8* 1.6 1.7  --     Recent Results (from the past 240 hour(s))  Blood Culture (routine x 2)     Status: None   Collection Time: 06/10/18 11:38 AM  Result Value Ref Range Status   Specimen Description BLOOD RIGHT ANTECUBITAL  Final   Special Requests   Final    BOTTLES DRAWN AEROBIC AND ANAEROBIC Blood Culture adequate volume   Culture   Final    NO GROWTH 5  DAYS Performed at [facility], [place] [address] [place], [place] [address]    Report Status 06/15/2018 FINAL  Final  Urine culture     Status: Abnormal   Collection Time: 06/10/18 11:38 AM  Result Value Ref Range Status   Specimen Description URINE, RANDOM  Final   Special Requests   Final    Normal Performed at [facility], [place] [address], [place], [place] [address]    Culture MULTIPLE SPECIES PRESENT, SUGGEST RECOLLECTION (A)  Final   Report Status 06/11/2018 FINAL  Final  Respiratory Panel by PCR     Status: None   Collection Time: 06/10/18 11:39 AM  Result Value Ref Range Status   Adenovirus NOT DETECTED NOT DETECTED Final   Coronavirus 229E NOT DETECTED NOT DETECTED Final   Coronavirus HKU1 NOT DETECTED NOT DETECTED Final   Coronavirus NL63 NOT DETECTED NOT DETECTED Final   Coronavirus OC43 NOT DETECTED NOT DETECTED Final   Metapneumovirus NOT DETECTED NOT DETECTED Final   Rhinovirus / Enterovirus NOT DETECTED NOT DETECTED Final   Influenza A NOT DETECTED NOT DETECTED Final   Influenza B NOT DETECTED NOT DETECTED Final   Parainfluenza Virus 1 NOT DETECTED NOT DETECTED Final    Parainfluenza Virus 2 NOT DETECTED NOT DETECTED Final   Parainfluenza Virus 3 NOT DETECTED NOT DETECTED Final   Parainfluenza Virus 4 NOT DETECTED NOT DETECTED Final   Respiratory Syncytial Virus NOT DETECTED NOT DETECTED Final   Bordetella pertussis NOT DETECTED NOT DETECTED Final   Chlamydophila pneumoniae NOT DETECTED NOT DETECTED Final   Mycoplasma pneumoniae NOT DETECTED NOT DETECTED Final  Blood Culture (routine x 2)     Status: None   Collection Time: 06/10/18 11:43 AM  Result Value Ref Range Status   Specimen Description BLOOD BLOOD LEFT HAND  Final   Special Requests   Final    BOTTLES DRAWN AEROBIC AND ANAEROBIC Blood Culture adequate volume   Culture   Final    NO GROWTH 5 DAYS Performed at [facility], [place] [address], [place], [place] [address]    Report Status 06/15/2018 FINAL  Final  MRSA PCR Screening     Status: None   Collection Time: 06/10/18  5:09 PM  Result Value Ref Range Status   MRSA by PCR NEGATIVE NEGATIVE Final    Comment:        The GeneXpert MRSA Assay (FDA approved for NASAL specimens only), is one component of a comprehensive MRSA colonization surveillance program. It is not intended to diagnose MRSA infection nor to guide or monitor treatment for MRSA infections. Performed at [facility], [place] [address], [place], [place] [address]   Culture, respiratory (non-expectorated)     Status: None   Collection Time: 06/10/18  5:16 PM  Result Value Ref Range Status   Specimen Description TRACHEAL ASPIRATE  Final   Special Requests   Final    NONE Performed at [facility], [place] [address] [place], [place] [address]    Gram Stain   Final    ABUNDANT WBC PRESENT, PREDOMINANTLY PMN FEW GRAM POSITIVE RODS RARE GRAM POSITIVE COCCI RARE GRAM NEGATIVE RODS    Culture FEW ACINETOBACTER CALCOACETICUS/BAUMANNII COMPLEX  Final   Report Status 06/13/2018 FINAL  Final   Organism ID, Bacteria ACINETOBACTER CALCOACETICUS/BAUMANNII COMPLEX   Final      Susceptibility   Acinetobacter calcoaceticus/baumannii complex - MIC*    CEFTAZIDIME >=64 RESISTANT Resistant     CEFTRIAXONE >=  64 RESISTANT Resistant     CIPROFLOXACIN >=4 RESISTANT Resistant     GENTAMICIN <=1 SENSITIVE Sensitive     IMIPENEM 1 SENSITIVE Sensitive     PIP/TAZO >=128 RESISTANT Resistant     TRIMETH/SULFA >=320 RESISTANT Resistant     CEFEPIME 16 INTERMEDIATE Intermediate     AMPICILLIN/SULBACTAM 4 SENSITIVE Sensitive     * FEW ACINETOBACTER CALCOACETICUS/BAUMANNII COMPLEX  C difficile quick scan w PCR reflex     Status: None   Collection Time: 06/12/18  6:25 PM  Result Value Ref Range Status   C Diff antigen NEGATIVE NEGATIVE Final   C Diff toxin NEGATIVE NEGATIVE Final   C Diff interpretation No C. difficile detected.  Final    Comment: Performed at [facility], [place] [address], [place], [place] [address]  Urine Culture     Status: None   Collection Time: 06/12/18  6:25 PM  Result Value Ref Range Status   Specimen Description URINE, CATHETERIZED  Final   Special Requests NONE  Final   Culture   Final    NO GROWTH Performed at [facility], [address], [place], [place] [address]    Report Status 06/13/2018 FINAL  Final         Radiology Studies: No results found.      Scheduled Meds: . apixaban  2.5 mg Per Tube BID  . aspirin  81 mg Oral Daily  . carvedilol  6.25 mg Per Tube BID WC  . chlorhexidine gluconate (MEDLINE KIT)  15 mL Mouth Rinse BID  . Chlorhexidine Gluconate Cloth  6 each Topical Daily  . collagenase   Topical Daily  . diazepam  2.5 mg Per Tube Q12H  . famotidine  20 mg Per Tube QHS  . free water  200 mL Per Tube Q4H  . furosemide  40 mg Oral Daily  . insulin aspart  0-9 Units Subcutaneous Q4H  . levETIRAcetam  1,000 mg Per Tube BID  . mouth rinse  15 mL Mouth Rinse 10 times per day  . multivitamin  15 mL Per Tube Daily  . nutrition supplement (JUVEN)  1 packet Per Tube BID BM  . sodium chloride flush   3 mL Intravenous Q12H  . valproic acid  250 mg Per Tube BID   Continuous Infusions: . sodium chloride 10 mL/hr at 06/15/18 0600  . feeding supplement (VITAL AF 1.2 CAL) 1,000 mL (06/15/18 0149)  . meropenem (MERREM) IV 1 g (06/15/18 0937)  . vancomycin Stopped (06/14/18 1239)     LOS: 5 days    Time spent: [place], MD [facility]  If 7PM-7AM, please contact night-coverage [email or web address] Password [email or web address] 06/15/2018, 10:03 AM

## 2018-06-15 NOTE — Progress Notes (Signed)
Nutrition Follow-up  DOCUMENTATION CODES:   Not applicable  INTERVENTION:   Continue TF via PEG:  Vital AF 1.2 at 65 ml/h (1560 ml per day)  Provides 1872 kcal, 117 gm protein, 1265 ml free water daily  Continue Juven BID via PEG, provides CaHMB, glutamine, arginine, collagen protein, and micronutrients to promote wound healing  NUTRITION DIAGNOSIS:   Inadequate oral intake related to inability to eat as evidenced by NPO status.  Ongoing   GOAL:   Patient will meet greater than or equal to 90% of their needs  Met with TF  MONITOR:   TF tolerance, Labs, Vent status, Skin  ASSESSMENT:   83 yo female with PMH of HTN, HLD, heart block, CHF, CAD, COPD, CVA, seizures, chronic VDRF, trach, PEG who was admitted from [place] with fever, sepsis.   C. diff, respiratory virus panel, and urine culture all negative. Sputum culture positive for Acinetobacter; receiving IV antibiotics.  Patient remains intubated on ventilator support MV: 6.9 L/min Temp (24hrs), Avg:99.6 F (37.6 C), Min:98.1 F (36.7 C), Max:100.6 F (38.1 C)   Patient is receiving hydrotherapy to stage IV sacral wound.   Labs reviewed. CBG's: [phone] Medications reviewed and include lasix, MVI, novolog, Juven, free water 200 ml every 4 hours.   Patient is currently receiving Vital AF 1.2 via PEG at 65 ml/h (1560 ml/day) to provide 1872 kcals, 117 gm protein, 1265 ml free water daily. Also receiving Juven BID.  I/O net +10.7 L  Diet Order:   Diet Order            Diet NPO time specified  Diet effective now              EDUCATION NEEDS:   No education needs have been identified at this time  Skin:  Skin Assessment: Skin Integrity Issues: Skin Integrity Issues:: Stage IV Stage IV: sacrum & vertebral column  Last BM:  2/6 (rectal tube)  Height:   Ht Readings from Last 1 Encounters:  06/10/18 _0  (1.626 m)    Weight:   Wt Readings from Last 1 Encounters:  06/15/18 77.6 kg     Ideal Body Weight:  54.5 kg  BMI:  Body mass index is 29.37 kg/m.  Estimated Nutritional Needs:   Kcal:  1800  Protein:  100-125 gm  Fluid:  1.8 L    Molli Barrows, RD, LDN, [place] Pager [phone] After Hours Pager [phone]

## 2018-06-15 NOTE — Progress Notes (Signed)
Physical Therapy Wound Treatment/Discharge Patient Details  Name: Melissa Cochran MRN: [id number] Date of Birth: 07-Nov-1935  Today's Date: 06/15/2018 Time: 4166-0630 Time Calculation (min): 34 min  Subjective  Subjective: Pt on vent Patient and Family Stated Goals: unable to state Date of Onset: (prior to admission) Prior Treatments: dressing changes  Pain Score:  Grimace with positioning  Wound Assessment  Pressure Injury 04/18/18 Stage IV - Full thickness tissue loss with exposed bone, tendon or muscle. (Active)  Dressing Type Gauze (Comment);Barrier Film (skin prep);Moist to moist;Foam 06/15/2018 12:15 PM  Dressing Changed;Clean;Dry;Intact 06/15/2018 12:15 PM  Dressing Change Frequency Daily 06/15/2018 12:15 PM  State of Healing Non-healing 06/15/2018 12:15 PM  Site / Wound Assessment Pink;Red;Yellow 06/15/2018 12:15 PM  % Wound base Red or Granulating 85% 06/15/2018 12:15 PM  % Wound base Yellow/Fibrinous Exudate 15% 06/15/2018 12:15 PM  % Wound base Black/Eschar 0% 06/15/2018 12:15 PM  % Wound base Other/Granulation Tissue (Comment) 0% 06/15/2018 12:15 PM  Peri-wound Assessment Erythema (blanchable) 06/15/2018 12:15 PM  Wound Length (cm) 5.2 cm 06/12/2018 12:53 PM  Wound Width (cm) 5 cm 06/12/2018 12:53 PM  Wound Depth (cm) 2 cm 06/12/2018 12:53 PM  Wound Surface Area (cm^2) 26 cm^2 06/12/2018 12:53 PM  Wound Volume (cm^3) 52 cm^3 06/12/2018 12:53 PM  Margins Unattached edges (unapproximated) 06/15/2018 12:15 PM  Drainage Amount Minimal 06/15/2018 12:15 PM  Drainage Description Serous 06/15/2018 12:15 PM  Treatment Debridement (Selective);Hydrotherapy (Pulse lavage);Packing (Saline gauze) 06/15/2018 12:15 PM   Santyl applied to wound bed prior to applying dressing.    Hydrotherapy Pulsed lavage therapy - wound location: thoracic vertebral column Pulsed Lavage with Suction (psi): 8 psi Pulsed Lavage with Suction - Normal Saline Used: 1000 mL Pulsed Lavage Tip: Tip with splash shield Selective  Debridement Selective Debridement - Location: thoracic vertebral column Selective Debridement - Tools Used: Scissors;Forceps Selective Debridement - Tissue Removed: minimal removal of pale yellow slough   Wound Assessment and Plan  Wound Therapy - Assess/Plan/Recommendations Wound Therapy - Clinical Statement: Remaining nonviable tissue adherent. Recommend discontinuation of hydrotherapy. Wound Therapy - Functional Problem List: decr mobility Factors Delaying/Impairing Wound Healing: Infection - systemic/local;Immobility;Multiple medical problems;Polypharmacy Hydrotherapy Plan: Debridement;Dressing change;Patient/family education;Pulsatile lavage with suction Wound Therapy - Follow Up Recommendations: Skilled nursing facility Wound Plan: See above  Wound Therapy Goals- Improve the function of patient's integumentary system by progressing the wound(s) through the phases of wound healing (inflammation - proliferation - remodeling) by: Decrease Necrotic Tissue to: 5 Decrease Necrotic Tissue - Progress: Discontinued (comment) Increase Granulation Tissue to: 95 Increase Granulation Tissue - Progress: Discontinued (comment)  Goals will be updated until maximal potential achieved or discharge criteria met.  Discharge criteria: when goals achieved, discharge from hospital, MD decision/surgical intervention, no progress towards goals, refusal/missing three consecutive treatments without notification or medical reason.  GP     Glenn 06/15/2018, 2:30 PM [place] Pager [phone] Office [phone]

## 2018-06-15 NOTE — Progress Notes (Signed)
NAME:  Melissa Cochran, MRN:  [id number], DOB:  26-Feb-1936, LOS: 5 ADMISSION DATE:  06/10/2018, CONSULTATION DATE: 06/10/2018 REFERRING MD: Dr. Ronnald Nian, CHIEF COMPLAINT: Fever  Brief History   Patient is unresponsive, chronic trach from [facility].  All history was obtained from the patient's medical record.  I attempted to call the patient's family at the numbers provided within the chart and there was no answer.  83 year old female history of stroke status post tracheostomy, PEG tube, chronically bedbound patient of [facility] nursing [facility].  Patient with chronic indwelling Foley catheter presented to the hospital tachycardic and febrile.  EKG initially revealed H fibrillation with RVR she has a known sacral wound.  Patient was placed on IV antibiotics vancomycin and cefepime.  Pulmonary was consulted for recommendations regarding chronic tracheostomy tube.  And whether or not the patient would need to be in the intensive care unit.  History of present illness   83 year old female past medical history of stroke status post tracheostomy, PEG tube, chronically bedbound admitted to from [facility] with concern of sepsis.  She was found to be febrile and tachycardic in atrial fibrillation with RVR.  She has a known sacral bed wound as well as chronic Foley.  She was given saline bolus in the ER Tylenol for fever and started on vancomycin and cefepime.  She is found to have a leukocytosis, mildly elevated lactic acid and urinalysis consistent with urinary tract infection.  Chest x-ray that was completed in the ER had no evidence of infiltrate or pneumonia.  She is currently on baseline vent settings on 21% FiO2.  Her blood pressure was stable at the time of this evaluation.  Map greater than 80.  Past Medical History   Past Medical History:  Diagnosis Date  . Adnexal mass   . Anxiety   . Arthritis   . Bladder tumor   . Bleeding ulcer   . CHF (congestive heart failure) ([place])   . CHF (congestive  heart failure) ([place])   . Chronic bronchitis ([place])   . Chronic respiratory failure ([place]) 04/19/2018  . COPD (chronic obstructive pulmonary disease) ([place])   . Coronary artery disease   . CVA (cerebral vascular accident) ([place]) 04/19/2018  . DVT (deep venous thrombosis) ([facility])    RLE  . GERD (gastroesophageal reflux disease)   . Heart block    left  . History of bleeding ulcers   . History of kidney stones   . Hyperlipemia   . Hypertension   . Myocardial infarction ([place])    "slight one" (07/05/2017)  . Pneumonia    "several times" (07/05/2017)  . Seizures ([place]) 02/2017 "several"; 06/26/2017 X 1  . Shortness of breath dyspnea   . Stroke [facility]) 02/13/2018   "still right sided weakness" (07/05/2017)     [facility] admission 05/11/2018  Consults:  Critical care  Procedures:  None  Significant Diagnostic Tests:  Lactic acid 2.8 down to 1.6  Micro Data:  04/21/2018 sputum culture: Pseudomonas aeruginosa 04/20/2018: MRSA PCR positive  06/10/2018: Blood cultures no growth to date 06/10/2018: Urine culture pending  Antimicrobials:  Cefepime Vancomycin  Interim history/subjective:  Please see HPI as above  Objective   Blood pressure 122/73, pulse 85, temperature 99.5 F (37.5 C), resp. rate 16, height 5\' 4"  (1.626 m), weight 77.6 kg, SpO2 98 %.    Vent Mode: PRVC FiO2 (%):  [30 %] 30 % Set Rate:  [12 bmp] 12 bmp Vt Set:  [500 mL] 500 mL  PEEP:  [5 cmH20] 5 cmH20 Plateau Pressure:  [21 cmH20-24 cmH20] 24 cmH20   Intake/Output Summary (Last 24 hours) at 06/15/2018 0943 Last data filed at 06/15/2018 0600 Gross per 24 hour  Intake 2148.33 ml  Output 2115 ml  Net 33.33 ml   Filed Weights   06/13/18 0346 06/14/18 0427 06/15/18 0500  Weight: 74.9 kg 75.6 kg 77.6 kg    Examination: General: Frail elderly female poorly responsive HEENT: [place] trach is in place Neuro: Poorly responsive to any type of stimuli CV: Sounds are distant PULM:  even/non-labored, lungs bilaterally rhonchi ZO:XWRU, non-tender, bsx4 active  Extremities: warm/dry, plus edema  Skin: No changes sacral wound dressing    Resolved Hospital Problem list     Assessment & Plan:   Chronic hypoxemic respiratory failure requiring tracheostomy tube and mechanical ventilatory support - She is not weanable at this time.  New current ventilator settings and attempt weaning.  Severe sepsis secondary to presumed urinary tract infection in the setting of chronic Foley - Continue current treatment per primary  History of atrial fibrillation however appears sinus on telemetry in the ED - Per primary  History of chronic systolic heart failure -Prior echo in 2019 with EF 45 to 50% Elevated pulmonary pressures consistent with moderate pulmonary hypertension, peak PA pressures 56 mmHg -Per primary  Sacral wound Per primary  History of seizures Per primary  Pulmonary critical care will see twice a week. Labs   CBC: Recent Labs  Lab 06/10/18 1145  06/11/18 0314 06/12/18 0207 06/13/18 0346 06/14/18 0423 06/15/18 0411  WBC 16.5*  --  13.3* 7.6 7.8 6.9 9.5  NEUTROABS 14.8*  --   --   --   --   --   --   HGB 9.5*   < > 7.9* 7.6* 8.4* 7.8* 8.5*  HCT 36.8   < > 30.0* 29.5* 31.0* 28.7* 31.2*  MCV 96.6  --  95.5 95.5 93.7 94.1 93.7  PLT 365  --  316 278 289 286 303   < > = values in this interval not displayed.    Basic Metabolic Panel: Recent Labs  Lab 06/11/18 0314 06/12/18 0207 06/13/18 0346 06/14/18 0423 06/15/18 0411  NA 146* 145 142 139 138  K 4.4 3.7 3.6 4.0 4.4  CL 112* 111 111 105 102  CO2 26 26 26 26 26   GLUCOSE 120* 98 106* 106* 114*  BUN 50* 52* 39* 35* 36*  CREATININE 0.46 0.44 0.36* 0.38* 0.39*  CALCIUM 9.5 9.3 9.5 9.4 9.5  MG  --  2.1  --   --   --   PHOS  --  2.5  --   --   --    GFR: Estimated Creatinine Clearance: 56.6 mL/min (A) (by C-G formula based on SCr of 0.39 mg/dL (L)). Recent Labs  Lab 06/10/18 1141  06/10/18 1145 06/10/18 1308 06/10/18 1625 06/10/18 1941  06/12/18 0207 06/13/18 0346 06/14/18 0423 06/15/18 0411  PROCALCITON  --  <0.10  --   --   --   --   --   --   --  <0.10  WBC  --  16.5*  --   --   --    < > 7.6 7.8 6.9 9.5  LATICACIDVEN 2.7*  --  2.8* 1.6 1.7  --   --   --   --   --    < > = values in this interval not displayed.    Liver Function Tests: Recent Labs  Lab 06/10/18 1145 06/12/18 0207 06/13/18 0346 06/14/18 0423  AST 21 17 17 15   ALT 17 12 13 14   ALKPHOS 72 56 65 64  BILITOT 0.5 0.7 0.8 0.6  PROT 6.6 5.3* 5.7* 5.4*  ALBUMIN 2.4* 2.0* 2.1* 1.9*   No results for input(s): LIPASE, AMYLASE in the last 168 hours. No results for input(s): AMMONIA in the last 168 hours.  ABG    Component Value Date/Time   PHART 7.458 (H) 06/10/2018 1210   PCO2ART 42.5 06/10/2018 1210   PO2ART 92.0 06/10/2018 1210   HCO3 29.4 (H) 06/10/2018 1210   TCO2 31 06/10/2018 1210   ACIDBASEDEF 2.0 06/26/2017 0210   O2SAT 96.0 06/10/2018 1210     Coagulation Profile: No results for input(s): INR, PROTIME in the last 168 hours.  Cardiac Enzymes: No results for input(s): CKTOTAL, CKMB, CKMBINDEX, TROPONINI in the last 168 hours.  HbA1C: Hgb A1c MFr Bld  Date/Time Value Ref Range Status  02/13/2017 09:35 PM 5.1 4.8 - 5.6 % Final    Comment:    (NOTE) Pre diabetes:          5.7%-6.4% Diabetes:              >6.4% Glycemic control for   <7.0% adults with diabetes     CBG: Recent Labs  Lab 06/14/18 1545 06/14/18 1918 06/14/18 2333 06/15/18 0344 06/15/18 [place] 103*      Steve  ACNP Maryanna Shape PCCM Pager [phone] till 1 pm If no answer page [phone] 06/15/2018, 9:43 AM

## 2018-06-16 LAB — BASIC METABOLIC PANEL
Anion gap: 9 (ref 5–15)
CO2: 28 mmol/L (ref 22–32)
Calcium: 9.3 mg/dL (ref 8.9–10.3)
Chloride: 100 mmol/L (ref 98–111)
Creatinine, Ser: 0.39 mg/dL — ABNORMAL LOW (ref 0.44–1.00)
GFR calc Af Amer: >60 mL/min (ref >60)
GFR calc non Af Amer: >60 mL/min (ref >60)
Potassium: 4.3 mmol/L (ref 3.5–5.1)
Sodium: 137 mmol/L (ref 135–145)

## 2018-06-16 LAB — GLUCOSE, CAPILLARY
Glucose-Capillary: 100 mg/dL — ABNORMAL HIGH (ref 70–99)
Glucose-Capillary: 104 mg/dL — ABNORMAL HIGH (ref 70–99)
Glucose-Capillary: 107 mg/dL — ABNORMAL HIGH (ref 70–99)
Glucose-Capillary: 122 mg/dL — ABNORMAL HIGH (ref 70–99)
Glucose-Capillary: 96 mg/dL (ref 70–99)
Glucose-Capillary: 99 mg/dL (ref 70–99)

## 2018-06-16 LAB — CBC
HCT: 30.6 % — ABNORMAL LOW (ref 36.0–46.0)
Hemoglobin: 8.5 g/dL — ABNORMAL LOW (ref 12.0–15.0)
MCH: 26.4 pg (ref 26.0–34.0)
MCHC: 27.8 g/dL — ABNORMAL LOW (ref 30.0–36.0)
MCV: 95 fL (ref 80.0–100.0)
Platelets: 285 10*3/uL (ref 150–400)
RBC: 3.22 MIL/uL — ABNORMAL LOW (ref 3.87–5.11)
RDW: 19.3 % — ABNORMAL HIGH (ref 11.5–15.5)
WBC: 7.6 10*3/uL (ref 4.0–10.5)
nRBC: 0.7 % — ABNORMAL HIGH (ref 0.0–0.2)

## 2018-06-16 LAB — BASIC METABOLIC PANEL WITH GFR
BUN: 35 mg/dL — ABNORMAL HIGH (ref 8–23)
Glucose, Bld: 103 mg/dL — ABNORMAL HIGH (ref 70–99)

## 2018-06-16 LAB — PROCALCITONIN: Procalcitonin: <0.1 ng/mL

## 2018-06-16 NOTE — Progress Notes (Signed)
Pharmacy Antibiotic Note  Melissa Cochran is a 83 y.o. female admitted from [facility] on 06/10/2018 with sepsis, source UTI vs. sacral wound. She has been on abx since 2/1. D/w Dr. [place] Pink and we will dc abx today.    Plan: Dc Merrem after today's doses  Height: 5\' 4"  (162.6 cm) Weight: 168 lb 14 oz (76.6 kg) IBW/kg (Calculated) : 54.7  Temp (24hrs), Avg:98.4 F (36.9 C), Min:98 F (36.7 C), Max:98.9 F (37.2 C)  Recent Labs  Lab 06/10/18 1141  06/10/18 1308 06/10/18 1625 06/10/18 1941  06/12/18 0207 06/13/18 0346 06/14/18 0423 06/15/18 0411 06/15/18 1030 06/16/18 0513  WBC  --    < >  --   --   --    < > 7.6 7.8 6.9 9.5  --  7.6  CREATININE  --    < >  --   --   --    < > 0.44 0.36* 0.38* 0.39*  --  0.39*  LATICACIDVEN 2.7*  --  2.8* 1.6 1.7  --   --   --   --   --   --   --   VANCOTROUGH  --   --   --   --   --   --   --   --   --   --  10*  --    < > = values in this interval not displayed.    Estimated Creatinine Clearance: 56.2 mL/min (A) (by C-G formula based on SCr of 0.39 mg/dL (L)).    No Known Allergies  Antimicrobials this admission: Vancomycin 2/1>>2/2; 2/3 >> Meropenem 2/1 >>2/7  Microbiology results: 2/1BCx: NG x2 2/1 TA cx: few acinetobacter  2/1UCx: multiple species 2/1 RespiratoryPCR: negative 2/1 MRSA PCR: negative  Onnie Boer, PharmD, BCIDP, AAHIVP, CPP Infectious Disease Pharmacist 06/16/2018 9:58 AM

## 2018-06-16 NOTE — Progress Notes (Signed)
PROGRESS NOTE    Melissa Cochran  OIN:[id number] DOB: [birth year] DOA: 06/10/2018 PCP: [name], MD  Brief Narrative: 83 year old with past medical history relevant for chronic atrial fibrillation on apixaban, hypertension, coronary artery disease status post stent in 7096, chronic systolic heart failure with EF of 45 to 50% by echo on 06/28/2017, nephrolithiasis complicated by hydronephrosis status post stent placement in 2017, history of bilateral hydronephrosis, CVA, seizure disorder complicated by chronic respiratory failure with ventilator dependence and PEG tube placement who was admitted from [facility] for sepsis of unclear source.  Following admission, patient continued to have low-grade fevers.  Work-up has been unremarkable other than sputum culture growing out Acinetobacter and so patient has been on meropenem.  She is finally been afebrile since 2/6 evening.  Patient remains unresponsive to stimuli, her baseline apparently   Assessment & Plan:   Principal Problem:   Sepsis ([place]) Active Problems:   Essential (primary) hypertension   Pressure injury of skin   Seizures (HCC)   A-fib (HCC)   Chronic respiratory failure (HCC)   #) Sepsis of unclear source: Patient continues to have low-grade fevers of unclear source -Procalcitonin undetectable - C. difficile negative, respiratory virus panel negative -Blood cultures 06/10/2018- -Urine culture 06/12/2018 after Foley was replaced negative - Sputum culture from 06/10/2018 growing out Acinetobacter  -Continue IV meropenem 06/10/2018 Now afebrile x24 hours  #) Seizure disorder: -Continue levetiracetam thousand milligrams twice daily by tube -Continue valproic acid 250 mg twice daily by tube  #) Chronic respiratory failure status post tracheostomy: -PCCM following, appreciate t recommendations  #) PEG tube dependence: Resolved with free water flushes  -Continue free water flushes 200 mL's every 8 hours - Vital 1.2 @  65 mL/hr with 30 mL Prostat BID  #) Chronic atrial fibrillation complicated by CVA: -Continue beta-blocker -Continue apixaban 2.5 mg twice daily  #) Hypertension/hyperlipidemia/coronary artery disease status post stent: -Continue aspirin 81 mg -Continue carvedilol 6.25 mg twice daily Blood pressure has been a little bit soft this afternoon  #) Anemia: This appears to be stable at this time.  Suspect most likely iron deficiency anemia and anemia of chronic disease.  #) Nephrolithiasis/hydronephrosis status post stents: Currently her creatinine is normal. -Low threshold to repeat CT abdomen   #) Chronic systolic heart failure: Last echo in February showed EF of 40 to 45%.  Remains euvolemic -Continue furosemide 40 mg daily via tube  Fluids: Free water flushes Electrolytes: Per above Nutrition: Per above  Prophylaxis: Apixaban  Disposition: Return back to LTAC, potentially tomorrow if she remains afebrile  Full code   Consultants:   PCCM  Procedures:   None  Antimicrobials:  IV meropenem started 06/10/2018    Objective: Vitals:   06/16/18 1600 06/16/18 1615 06/16/18 1700 06/16/18 1800  BP: (!) 88/55 105/70 95/61 103/65  Pulse: 74 76 81 77  Resp: _0 Temp:      TempSrc:      SpO2: 100% 100% 100% 100%  Weight:      Height:        Intake/Output Summary (Last 24 hours) at 06/16/2018 1920 Last data filed at 06/16/2018 1800 Gross per 24 hour  Intake 2364.81 ml  Output 900 ml  Net 1464.81 ml   Filed Weights   06/14/18 0427 06/15/18 0500 06/16/18 0500  Weight: 75.6 kg 77.6 kg 76.6 kg    Examination:  General exam:  Remains unresponsive to external stimuli, awake at times Respiratory system: Scattered rhonchi, more at  bases Cardiovascular system:  Regular rate and rhythm, S1-S2 Gastrointestinal system: Soft, nondistended, hypoactive bowel sounds neurology: Difficult to assess given no response to commands.  Left-sided hemiplegia Extremities:  Lower extremity edema Skin: Tracheostomy site is clean dry and intact, PEG tube site is clean dry and intact Psychiatry: Unable to assess due to her chronic medical issues    Data Reviewed: I have personally reviewed following labs and imaging studies  CBC: Recent Labs  Lab 06/10/18 1145  06/12/18 0207 06/13/18 0346 06/14/18 0423 06/15/18 0411 06/16/18 0513  WBC 16.5*   < > 7.6 7.8 6.9 9.5 7.6  NEUTROABS 14.8*  --   --   --   --   --   --   HGB 9.5*   < > 7.6* 8.4* 7.8* 8.5* 8.5*  HCT 36.8   < > 29.5* 31.0* 28.7* 31.2* 30.6*  MCV 96.6   < > 95.5 93.7 94.1 93.7 95.0  PLT 365   < > 278 289 286 303 285   < > = values in this interval not displayed.   Basic Metabolic Panel: Recent Labs  Lab 06/12/18 0207 06/13/18 0346 06/14/18 0423 06/15/18 0411 06/16/18 0513  NA 145 142 139 138 137  K 3.7 3.6 4.0 4.4 4.3  CL 111 111 105 102 100  CO2 _0 GLUCOSE 98 106* 106* 114* 103*  BUN 52* 39* 35* 36* 35*  CREATININE 0.44 0.36* 0.38* 0.39* 0.39*  CALCIUM 9.3 9.5 9.4 9.5 9.3  MG 2.1  --   --   --   --   PHOS 2.5  --   --   --   --    GFR: Estimated Creatinine Clearance: 56.2 mL/min (A) (by C-G formula based on SCr of 0.39 mg/dL (L)). Liver Function Tests: Recent Labs  Lab 06/10/18 1145 06/12/18 0207 06/13/18 0346 06/14/18 0423  AST _1 ALT _2 ALKPHOS 72 56 65 64  BILITOT 0.5 0.7 0.8 0.6  PROT 6.6 5.3* 5.7* 5.4*  ALBUMIN 2.4* 2.0* 2.1* 1.9*   No results for input(s): LIPASE, AMYLASE in the last 168 hours. No results for input(s): AMMONIA in the last 168 hours. Coagulation Profile: No results for input(s): INR, PROTIME in the last 168 hours. Cardiac Enzymes: No results for input(s): CKTOTAL, CKMB, CKMBINDEX, TROPONINI in the last 168 hours. BNP (last 3 results) No results for input(s): PROBNP in the last 8760 hours. HbA1C: No results for input(s): HGBA1C in the last 72 hours. CBG: Recent Labs  Lab 06/16/18 0015 06/16/18 0428  06/16/18 0727 06/16/18 1138 06/16/18 1526  GLUCAP 99 100* 104* 122* 96   Lipid Profile: No results for input(s): CHOL, HDL, LDLCALC, TRIG, CHOLHDL, LDLDIRECT in the last 72 hours. Thyroid Function Tests: No results for input(s): TSH, T4TOTAL, FREET4, T3FREE, THYROIDAB in the last 72 hours. Anemia Panel: No results for input(s): VITAMINB12, FOLATE, FERRITIN, TIBC, IRON, RETICCTPCT in the last 72 hours. Sepsis Labs: Recent Labs  Lab 06/10/18 1141 06/10/18 1145 06/10/18 1308 06/10/18 1625 06/10/18 1941 06/15/18 0411 06/16/18 0513  PROCALCITON  --  <0.10  --   --   --  <0.10 <0.10  LATICACIDVEN 2.7*  --  2.8* 1.6 1.7  --   --     Recent Results (from the past 240 hour(s))  Blood Culture (routine x 2)     Status: None   Collection Time: 06/10/18 11:38 AM  Result Value Ref Range Status  Specimen Description BLOOD RIGHT ANTECUBITAL  Final   Special Requests   Final    BOTTLES DRAWN AEROBIC AND ANAEROBIC Blood Culture adequate volume   Culture   Final    NO GROWTH 5 DAYS Performed at [facility], [address] [place], [place] [address]    Report Status 06/15/2018 FINAL  Final  Urine culture     Status: Abnormal   Collection Time: 06/10/18 11:38 AM  Result Value Ref Range Status   Specimen Description URINE, RANDOM  Final   Special Requests   Final    Normal Performed at [facility], [place] [address], [place], [place] [address]    Culture MULTIPLE SPECIES PRESENT, SUGGEST RECOLLECTION (A)  Final   Report Status 06/11/2018 FINAL  Final  Respiratory Panel by PCR     Status: None   Collection Time: 06/10/18 11:39 AM  Result Value Ref Range Status   Adenovirus NOT DETECTED NOT DETECTED Final   Coronavirus 229E NOT DETECTED NOT DETECTED Final   Coronavirus HKU1 NOT DETECTED NOT DETECTED Final   Coronavirus NL63 NOT DETECTED NOT DETECTED Final   Coronavirus OC43 NOT DETECTED NOT DETECTED Final   Metapneumovirus NOT DETECTED NOT DETECTED Final   Rhinovirus /  Enterovirus NOT DETECTED NOT DETECTED Final   Influenza A NOT DETECTED NOT DETECTED Final   Influenza B NOT DETECTED NOT DETECTED Final   Parainfluenza Virus 1 NOT DETECTED NOT DETECTED Final   Parainfluenza Virus 2 NOT DETECTED NOT DETECTED Final   Parainfluenza Virus 3 NOT DETECTED NOT DETECTED Final   Parainfluenza Virus 4 NOT DETECTED NOT DETECTED Final   Respiratory Syncytial Virus NOT DETECTED NOT DETECTED Final   Bordetella pertussis NOT DETECTED NOT DETECTED Final   Chlamydophila pneumoniae NOT DETECTED NOT DETECTED Final   Mycoplasma pneumoniae NOT DETECTED NOT DETECTED Final  Blood Culture (routine x 2)     Status: None   Collection Time: 06/10/18 11:43 AM  Result Value Ref Range Status   Specimen Description BLOOD BLOOD LEFT HAND  Final   Special Requests   Final    BOTTLES DRAWN AEROBIC AND ANAEROBIC Blood Culture adequate volume   Culture   Final    NO GROWTH 5 DAYS Performed at [facility], [place] [address], [place], [place] [address]    Report Status 06/15/2018 FINAL  Final  MRSA PCR Screening     Status: None   Collection Time: 06/10/18  5:09 PM  Result Value Ref Range Status   MRSA by PCR NEGATIVE NEGATIVE Final    Comment:        The GeneXpert MRSA Assay (FDA approved for NASAL specimens only), is one component of a comprehensive MRSA colonization surveillance program. It is not intended to diagnose MRSA infection nor to guide or monitor treatment for MRSA infections. Performed at [facility], [place] [address] [place], [place] [address]   Culture, respiratory (non-expectorated)     Status: None   Collection Time: 06/10/18  5:16 PM  Result Value Ref Range Status   Specimen Description TRACHEAL ASPIRATE  Final   Special Requests   Final    NONE Performed at [facility], [place] [address], [place], [place] [address]    Gram Stain   Final    ABUNDANT WBC PRESENT, PREDOMINANTLY PMN FEW GRAM POSITIVE RODS RARE GRAM POSITIVE COCCI RARE GRAM  NEGATIVE RODS    Culture FEW ACINETOBACTER CALCOACETICUS/BAUMANNII COMPLEX  Final   Report Status 06/13/2018  FINAL  Final   Organism ID, Bacteria ACINETOBACTER CALCOACETICUS/BAUMANNII COMPLEX  Final      Susceptibility   Acinetobacter calcoaceticus/baumannii complex - MIC*    CEFTAZIDIME >=64 RESISTANT Resistant     CEFTRIAXONE >=64 RESISTANT Resistant     CIPROFLOXACIN >=4 RESISTANT Resistant     GENTAMICIN <=1 SENSITIVE Sensitive     IMIPENEM 1 SENSITIVE Sensitive     PIP/TAZO >=128 RESISTANT Resistant     TRIMETH/SULFA >=320 RESISTANT Resistant     CEFEPIME 16 INTERMEDIATE Intermediate     AMPICILLIN/SULBACTAM 4 SENSITIVE Sensitive     * FEW ACINETOBACTER CALCOACETICUS/BAUMANNII COMPLEX  C difficile quick scan w PCR reflex     Status: None   Collection Time: 06/12/18  6:25 PM  Result Value Ref Range Status   C Diff antigen NEGATIVE NEGATIVE Final   C Diff toxin NEGATIVE NEGATIVE Final   C Diff interpretation No C. difficile detected.  Final    Comment: Performed at [facility], [place] [address] [place], [place] [address]  Urine Culture     Status: None   Collection Time: 06/12/18  6:25 PM  Result Value Ref Range Status   Specimen Description URINE, CATHETERIZED  Final   Special Requests NONE  Final   Culture   Final    NO GROWTH Performed at [facility], [address], [place],  [address]    Report Status 06/13/2018 FINAL  Final         Radiology Studies: No results found.      Scheduled Meds: . apixaban  2.5 mg Per Tube BID  . aspirin  81 mg Oral Daily  . carvedilol  6.25 mg Per Tube BID WC  . chlorhexidine gluconate (MEDLINE KIT)  15 mL Mouth Rinse BID  . Chlorhexidine Gluconate Cloth  6 each Topical Daily  . collagenase   Topical Daily  . diazepam  2.5 mg Per Tube Q12H  . famotidine  20 mg Per Tube QHS  . free water  200 mL Per Tube Q4H  . furosemide  40 mg Oral Daily  . insulin aspart  0-9 Units Subcutaneous Q4H  . levETIRAcetam   1,000 mg Per Tube BID  . mouth rinse  15 mL Mouth Rinse 10 times per day  . multivitamin  15 mL Per Tube Daily  . nutrition supplement (JUVEN)  1 packet Per Tube BID BM  . sodium chloride flush  3 mL Intravenous Q12H  . valproic acid  250 mg Per Tube BID   Continuous Infusions: . sodium chloride Stopped (06/16/18 1734)  . feeding supplement (VITAL AF 1.2 CAL) 65 mL/hr at 06/16/18 1800     LOS: 6 days    Time spent: [place], MD [facility]  If 7PM-7AM, please contact night-coverage [email or web address] Password [email or web address] 06/16/2018, 7:20 PM

## 2018-06-17 DIAGNOSIS — N3000 Acute cystitis without hematuria: Secondary | ICD-10-CM | POA: Diagnosis present

## 2018-06-17 LAB — GLUCOSE, CAPILLARY
Glucose-Capillary: 100 mg/dL — ABNORMAL HIGH (ref 70–99)
Glucose-Capillary: 106 mg/dL — ABNORMAL HIGH (ref 70–99)
Glucose-Capillary: 108 mg/dL — ABNORMAL HIGH (ref 70–99)
Glucose-Capillary: 122 mg/dL — ABNORMAL HIGH (ref 70–99)
Glucose-Capillary: 96 mg/dL (ref 70–99)
Glucose-Capillary: 96 mg/dL (ref 70–99)
Glucose-Capillary: 97 mg/dL (ref 70–99)

## 2018-06-17 LAB — CBC
HCT: 31.4 % — ABNORMAL LOW (ref 36.0–46.0)
Hemoglobin: 8.6 g/dL — ABNORMAL LOW (ref 12.0–15.0)
MCH: 26.3 pg (ref 26.0–34.0)
MCHC: 27.4 g/dL — ABNORMAL LOW (ref 30.0–36.0)
MCV: 96 fL (ref 80.0–100.0)
Platelets: 296 10*3/uL (ref 150–400)
RBC: 3.27 MIL/uL — ABNORMAL LOW (ref 3.87–5.11)
RDW: 23.3 % — ABNORMAL HIGH (ref 11.5–15.5)
WBC: 8.7 10*3/uL (ref 4.0–10.5)
nRBC: 0.6 % — ABNORMAL HIGH (ref 0.0–0.2)

## 2018-06-17 LAB — BASIC METABOLIC PANEL
ANION GAP: 9 (ref 5–15)
BUN: 37 mg/dL — ABNORMAL HIGH (ref 8–23)
CALCIUM: 9.3 mg/dL (ref 8.9–10.3)
CO2: 28 mmol/L (ref 22–32)
Chloride: 98 mmol/L (ref 98–111)
Creatinine, Ser: 0.38 mg/dL — ABNORMAL LOW (ref 0.44–1.00)
GFR calc Af Amer: >60 mL/min (ref >60)
GFR calc non Af Amer: >60 mL/min (ref >60)
GLUCOSE: 104 mg/dL — AB (ref 70–99)
Potassium: 5.9 mmol/L — ABNORMAL HIGH (ref 3.5–5.1)
Sodium: 135 mmol/L (ref 135–145)

## 2018-06-17 LAB — PROCALCITONIN: Procalcitonin: <0.1 ng/mL

## 2018-06-17 LAB — BASIC METABOLIC PANEL WITH GFR
Anion gap: 9 (ref 5–15)
BUN: 38 mg/dL — ABNORMAL HIGH (ref 8–23)
CO2: 29 mmol/L (ref 22–32)
Calcium: 9.3 mg/dL (ref 8.9–10.3)
Chloride: 96 mmol/L — ABNORMAL LOW (ref 98–111)
Creatinine, Ser: 0.36 mg/dL — ABNORMAL LOW (ref 0.44–1.00)
GFR calc Af Amer: >60 mL/min
GFR calc non Af Amer: >60 mL/min
Glucose, Bld: 98 mg/dL (ref 70–99)
Potassium: 6.2 mmol/L — ABNORMAL HIGH (ref 3.5–5.1)
Sodium: 134 mmol/L — ABNORMAL LOW (ref 135–145)

## 2018-06-17 MED ORDER — FREE WATER: 200.0000 mL | Status: DC

## 2018-06-17 MED ORDER — COLLAGENASE 250 UNIT/GM EX OINT: TOPICAL_OINTMENT | Freq: Every day | CUTANEOUS | 0 refills | Status: DC

## 2018-06-17 MED ORDER — INSULIN ASPART 100 UNIT/ML ~~LOC~~ SOLN
6.0000 [IU] | Freq: Once | SUBCUTANEOUS | Status: AC
  Administered 2018-06-17: 6 [IU] via SUBCUTANEOUS

## 2018-06-17 MED ORDER — WHITE PETROLATUM EX OINT
TOPICAL_OINTMENT | CUTANEOUS | Status: AC
  Administered 2018-06-17: 0.2
  Filled 2018-06-17: qty 28.35

## 2018-06-17 MED ORDER — DEXTROSE 50 % IV SOLN
1.0000 | Freq: Once | INTRAVENOUS | Status: AC
  Administered 2018-06-17: 50 mL via INTRAVENOUS
  Filled 2018-06-17: qty 50

## 2018-06-17 NOTE — Progress Notes (Signed)
PROGRESS NOTE    Melissa Cochran  OZD:[id number] DOB: 02-23-1936 DOA: 06/10/2018 PCP: [name], MD   Brief Narrative:83 year old with past medical history relevant for chronic atrial fibrillation on apixaban, hypertension, coronary artery disease status post stent in 2595, chronic systolic heart failure with EF of 45 to 50% by echo on 06/28/2017, nephrolithiasis complicated by hydronephrosis status post stent placement in 2017, history of bilateral hydronephrosis, CVA, seizure disorder complicated by chronic respiratory failure with ventilator dependence and PEG tube placement who was admitted from [facility] for sepsis of unclear source.  Following admission, patient continued to have low-grade fevers. Work-up has been unremarkable other than sputum culture growing out Acinetobacter and so patient has been on meropenem. She is finally been afebrile since 2/6 evening. Patient remains unresponsive to stimuli, her baseline apparently   Assessment & Plan:   Principal Problem:   Sepsis ([place]) Active Problems:   Essential (primary) hypertension   Pressure injury of skin   Seizures (HCC)   A-fib (HCC)   Chronic respiratory failure (HCC)   Acute cystitis without hematuria  #) Sepsis of unclear source: Patient continues to have low-grade fevers of unclear source -Procalcitonin undetectable - C. difficile negative, respiratory virus panel negative -Blood cultures 06/10/2018-negative -Urine culture 06/12/2018 after Foley was replaced negative - Sputum culture from 06/10/2018 growing out Acinetobacter she was treated with meropenem. Now afebrile x 48 hours  #) Seizure disorder: -Continue levetiracetam thousand milligrams twice daily by tube -Continue valproic acid 250 mg twice daily by tube  #) Chronic respiratory failure status post tracheostomy: Follow-up with pulmonologist at the facility.  #) PEG tube dependence: -Continue free water flushes 200 mL's every 8 hours - Vital  1.2 @ 65 mL/hr with 30 mL Prostat BID  #) Chronic atrial fibrillation complicated by CVA: -Continue beta-blocker -Continue apixaban 2.5 mg twice daily  #) Hypertension/hyperlipidemia/coronary artery disease status post stent: -Continue aspirin 81 mg -Continue carvedilol 6.25 mg twice daily  #) Anemia: This appears to be stable at this time. Suspect most likely iron deficiency anemia and anemia of chronic disease.  #) Nephrolithiasis/hydronephrosis status post stents: Currently her creatinine is normal. -Low threshold to repeat CT abdomen   #) Chronic systolic heart failure: Last echo in February showed EF of 40 to 45%. Remains euvolemic -Continue furosemide 40 mg daily via tube  #) Hyperkalemia her K was 6.2 which was repeated stat and the stat potassium level was 5.9.  She is not on any potassium replacements or anything that can increase potassium.  Recheck her levels tomorrow and treat accordingly.  She was given insulin and glucose for the hyperkalemia.   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 (Active)  04/18/18 1814  Location: Vertebral column  Location Orientation:   Staging: Stage IV - Full thickness tissue loss with exposed bone, tendon or muscle.  Wound Description (Comments):   Present on Admission: Yes     Pressure Injury 04/18/18 Unstageable - Full thickness tissue loss in which the base of the ulcer is covered by slough (yellow, tan, gray, green or brown) and/or eschar (tan, brown or black) in the wound bed. Has tunneling at 20 o clock, yellow slough aro (Active)  04/18/18 1815  Location: Sacrum  Location Orientation:   Staging: Unstageable - Full thickness tissue loss in which the base of the ulcer is covered by slough (yellow, tan, gray, green or brown) and/or eschar (tan, brown or black) in the wound bed.  Wound Description (Comments): Has tunneling at 74 o clock, yellow slough around edges of  wound bed  Present on Admission: Yes     Pressure Injury 06/10/18 Stage IV - Full thickness tissue loss with exposed bone, tendon or muscle. bone palpable  (Active)  06/10/18 1700  Location: Sacrum  Location Orientation: Mid;Upper  Staging: Stage IV - Full thickness tissue loss with exposed bone, tendon or muscle.  Wound Description (Comments): bone palpable   Present on Admission: Yes      Nutrition Problem: Inadequate oral intake Etiology: inability to eat     Signs/Symptoms: NPO status    Interventions: Tube feeding, Juven  Estimated body mass index is 28.99 kg/m as calculated from the following:   Height as of this encounter: '5\' 4"'$  (1.626 m).   Weight as of this encounter: 76.6 kg.  DVT prophylaxis: apixaban Code Status: pccm Family Communication: none Disposition Plan:  Dc monday  Consultants:  pccm  Procedures:none Antimicrobials: None  Subjective:non verbal trached    Objective: Vitals:   06/17/18 0930 06/17/18 1100 06/17/18 1115 06/17/18 1138  BP: 108/66 106/62  106/62  Pulse: 77 77  83  Resp: (!) 22 16  (!) 23  Temp:   99.9 F (37.7 C)   TempSrc:   Axillary   SpO2: 99% 98%  100%  Weight:      Height:        Intake/Output Summary (Last 24 hours) at 06/17/2018 1231 Last data filed at 06/17/2018 1100 Gross per 24 hour  Intake 2182.66 ml  Output 1750 ml  Net 432.66 ml   Filed Weights   06/15/18 0500 06/16/18 0500 06/17/18 0500  Weight: 77.6 kg 76.6 kg 76.6 kg    Examination:  General exam: Appears calm and comfortable  Respiratory system: scattered rhonchi to auscultation. Respiratory effort normal. Cardiovascular system: S1 & S2 heard, RRR. No JVD, murmurs, rubs, gallops or clicks. No pedal edema. Gastrointestinal system: Abdomen is nondistended, soft and nontender. No organomegaly or masses felt. Normal bowel sounds heard. Central nervous system: Alert and oriented. No focal neurological deficits. Extremities:2 plus edema Skin: No  rashes, lesions or ulcers Psychiatry: Judgement and insight appear normal. Mood & affect appropriate.     Data Reviewed: I have personally reviewed following labs and imaging studies  CBC: Recent Labs  Lab 06/13/18 0346 06/14/18 0423 06/15/18 0411 06/16/18 0513 06/17/18 0431  WBC 7.8 6.9 9.5 7.6 8.7  HGB 8.4* 7.8* 8.5* 8.5* 8.6*  HCT 31.0* 28.7* 31.2* 30.6* 31.4*  MCV 93.7 94.1 93.7 95.0 96.0  PLT 289 286 303 285 161   Basic Metabolic Panel: Recent Labs  Lab 06/12/18 0207  06/14/18 0423 06/15/18 0411 06/16/18 0513 06/17/18 0431 06/17/18 0803  NA 145   < > 139 138 137 134* 135  K 3.7   < > 4.0 4.4 4.3 6.2* 5.9*  CL 111   < > 105 102 100 96* 98  CO2 26   < > '26 26 28 29 28  '$ GLUCOSE 98   < > 106* 114* 103* 98 104*  BUN 52*   < > 35* 36* 35* 38* 37*  CREATININE 0.44   < > 0.38* 0.39* 0.39* 0.36* 0.38*  CALCIUM 9.3   < > 9.4 9.5 9.3 9.3 9.3  MG 2.1  --   --   --   --   --   --   PHOS 2.5  --   --   --   --   --   --    < > =  values in this interval not displayed.   GFR: Estimated Creatinine Clearance: 56.2 mL/min (A) (by C-G formula based on SCr of 0.38 mg/dL (L)). Liver Function Tests: Recent Labs  Lab 06/12/18 0207 06/13/18 0346 06/14/18 0423  AST '17 17 15  '$ ALT '12 13 14  '$ ALKPHOS 56 65 64  BILITOT 0.7 0.8 0.6  PROT 5.3* 5.7* 5.4*  ALBUMIN 2.0* 2.1* 1.9*   No results for input(s): LIPASE, AMYLASE in the last 168 hours. No results for input(s): AMMONIA in the last 168 hours. Coagulation Profile: No results for input(s): INR, PROTIME in the last 168 hours. Cardiac Enzymes: No results for input(s): CKTOTAL, CKMB, CKMBINDEX, TROPONINI in the last 168 hours. BNP (last 3 results) No results for input(s): PROBNP in the last 8760 hours. HbA1C: No results for input(s): HGBA1C in the last 72 hours. CBG: Recent Labs  Lab 06/16/18 2007 06/16/18 2357 06/17/18 0453 06/17/18 0712 06/17/18 1113  GLUCAP 107* 100* 96 108* 122*   Lipid Profile: No results for  input(s): CHOL, HDL, LDLCALC, TRIG, CHOLHDL, LDLDIRECT in the last 72 hours. Thyroid Function Tests: No results for input(s): TSH, T4TOTAL, FREET4, T3FREE, THYROIDAB in the last 72 hours. Anemia Panel: No results for input(s): VITAMINB12, FOLATE, FERRITIN, TIBC, IRON, RETICCTPCT in the last 72 hours. Sepsis Labs: Recent Labs  Lab 06/10/18 1308 06/10/18 1625 06/10/18 1941 06/15/18 0411 06/16/18 0513 06/17/18 0431  PROCALCITON  --   --   --  <0.10 <0.10 <0.10  LATICACIDVEN 2.8* 1.6 1.7  --   --   --     Recent Results (from the past 240 hour(s))  Blood Culture (routine x 2)     Status: None   Collection Time: 06/10/18 11:38 AM  Result Value Ref Range Status   Specimen Description BLOOD RIGHT ANTECUBITAL  Final   Special Requests   Final    BOTTLES DRAWN AEROBIC AND ANAEROBIC Blood Culture adequate volume   Culture   Final    NO GROWTH 5 DAYS Performed at [facility], [address] [place], [place] [address]    Report Status 06/15/2018 FINAL  Final  Urine culture     Status: Abnormal   Collection Time: 06/10/18 11:38 AM  Result Value Ref Range Status   Specimen Description URINE, RANDOM  Final   Special Requests   Final    Normal Performed at [facility], [place] [address] [place], [place] [address]    Culture MULTIPLE SPECIES PRESENT, SUGGEST RECOLLECTION (A)  Final   Report Status 06/11/2018 FINAL  Final  Respiratory Panel by PCR     Status: None   Collection Time: 06/10/18 11:39 AM  Result Value Ref Range Status   Adenovirus NOT DETECTED NOT DETECTED Final   Coronavirus 229E NOT DETECTED NOT DETECTED Final   Coronavirus HKU1 NOT DETECTED NOT DETECTED Final   Coronavirus NL63 NOT DETECTED NOT DETECTED Final   Coronavirus OC43 NOT DETECTED NOT DETECTED Final   Metapneumovirus NOT DETECTED NOT DETECTED Final   Rhinovirus / Enterovirus NOT DETECTED NOT DETECTED Final   Influenza A NOT DETECTED NOT DETECTED Final   Influenza B NOT DETECTED NOT DETECTED  Final   Parainfluenza Virus 1 NOT DETECTED NOT DETECTED Final   Parainfluenza Virus 2 NOT DETECTED NOT DETECTED Final   Parainfluenza Virus 3 NOT DETECTED NOT DETECTED Final   Parainfluenza Virus 4 NOT DETECTED NOT DETECTED Final   Respiratory Syncytial Virus NOT DETECTED NOT DETECTED Final   Bordetella pertussis NOT DETECTED NOT  DETECTED Final   Chlamydophila pneumoniae NOT DETECTED NOT DETECTED Final   Mycoplasma pneumoniae NOT DETECTED NOT DETECTED Final  Blood Culture (routine x 2)     Status: None   Collection Time: 06/10/18 11:43 AM  Result Value Ref Range Status   Specimen Description BLOOD BLOOD LEFT HAND  Final   Special Requests   Final    BOTTLES DRAWN AEROBIC AND ANAEROBIC Blood Culture adequate volume   Culture   Final    NO GROWTH 5 DAYS Performed at [facility], [place] [address] [place], [place] [address]    Report Status 06/15/2018 FINAL  Final  MRSA PCR Screening     Status: None   Collection Time: 06/10/18  5:09 PM  Result Value Ref Range Status   MRSA by PCR NEGATIVE NEGATIVE Final    Comment:        The GeneXpert MRSA Assay (FDA approved for NASAL specimens only), is one component of a comprehensive MRSA colonization surveillance program. It is not intended to diagnose MRSA infection nor to guide or monitor treatment for MRSA infections. Performed at [facility], [place] [address] [place], [place] [address]   Culture, respiratory (non-expectorated)     Status: None   Collection Time: 06/10/18  5:16 PM  Result Value Ref Range Status   Specimen Description TRACHEAL ASPIRATE  Final   Special Requests   Final    NONE Performed at [facility], [place] [address], [place], [place] [address]    Gram Stain   Final    ABUNDANT WBC PRESENT, PREDOMINANTLY PMN FEW GRAM POSITIVE RODS RARE GRAM POSITIVE COCCI RARE GRAM NEGATIVE RODS    Culture FEW ACINETOBACTER CALCOACETICUS/BAUMANNII COMPLEX  Final   Report Status 06/13/2018 FINAL  Final    Organism ID, Bacteria ACINETOBACTER CALCOACETICUS/BAUMANNII COMPLEX  Final      Susceptibility   Acinetobacter calcoaceticus/baumannii complex - MIC*    CEFTAZIDIME >=64 RESISTANT Resistant     CEFTRIAXONE >=64 RESISTANT Resistant     CIPROFLOXACIN >=4 RESISTANT Resistant     GENTAMICIN <=1 SENSITIVE Sensitive     IMIPENEM 1 SENSITIVE Sensitive     PIP/TAZO >=128 RESISTANT Resistant     TRIMETH/SULFA >=320 RESISTANT Resistant     CEFEPIME 16 INTERMEDIATE Intermediate     AMPICILLIN/SULBACTAM 4 SENSITIVE Sensitive     * FEW ACINETOBACTER CALCOACETICUS/BAUMANNII COMPLEX  C difficile quick scan w PCR reflex     Status: None   Collection Time: 06/12/18  6:25 PM  Result Value Ref Range Status   C Diff antigen NEGATIVE NEGATIVE Final   C Diff toxin NEGATIVE NEGATIVE Final   C Diff interpretation No C. difficile detected.  Final    Comment: Performed at [facility], [place] [address], [place], [place] [address]  Urine Culture     Status: None   Collection Time: 06/12/18  6:25 PM  Result Value Ref Range Status   Specimen Description URINE, CATHETERIZED  Final   Special Requests NONE  Final   Culture   Final    NO GROWTH Performed at [facility], [address] [place], [place] [address]    Report Status 06/13/2018 FINAL  Final         Radiology Studies: No results found.      Scheduled Meds: . apixaban  2.5 mg Per Tube BID  . aspirin  81 mg Oral Daily  . carvedilol  6.25 mg Per Tube BID WC  .  chlorhexidine gluconate (MEDLINE KIT)  15 mL Mouth Rinse BID  . Chlorhexidine Gluconate Cloth  6 each Topical Daily  . collagenase   Topical Daily  . diazepam  2.5 mg Per Tube Q12H  . famotidine  20 mg Per Tube QHS  . free water  200 mL Per Tube Q4H  . furosemide  40 mg Oral Daily  . insulin aspart  0-9 Units Subcutaneous Q4H  . levETIRAcetam  1,000 mg Per Tube BID  . mouth rinse  15 mL Mouth Rinse 10 times per day  . multivitamin  15 mL Per Tube Daily  . nutrition  supplement (JUVEN)  1 packet Per Tube BID BM  . sodium chloride flush  3 mL Intravenous Q12H  . valproic acid  250 mg Per Tube BID   Continuous Infusions: . sodium chloride 10 mL/hr at 06/17/18 0800  . feeding supplement (VITAL AF 1.2 CAL) 65 mL/hr at 06/16/18 1800     LOS: 7 days      Georgette Shell, MD [facility]  If 7PM-7AM, please contact night-coverage [email or web address] Password [facility] 06/17/2018, 12:31 PM

## 2018-06-17 NOTE — Discharge Summary (Addendum)
Physician Discharge Summary  Melissa Cochran VQQ:[id number] DOB: 1935-11-05 DOA: 06/10/2018  PCP: [name], MD  Admit date: 06/10/2018 Discharge date: 06/17/2018  [facility]  Disposition: [facility]  Recommendations for Outpatient Follow-up:  1. Follow up with PCP in 1-2 weeks 2. Please obtain BMP/CBC tomorrow 06/18/2018 3. Consult palliative care at the facility.  Home Health: None Equipment/Devices: None   Discharge Condition stable and improved CODE STATUS full code  Diet recommendation: Tube feeds Brief/Interim Summary:83 year old with past medical history relevant for chronic atrial fibrillation on apixaban, hypertension, coronary artery disease status post stent in 4332, chronic systolic heart failure with EF of 45 to 50% by echo on 06/28/2017, nephrolithiasis complicated by hydronephrosis status post stent placement in 2017, history of bilateral hydronephrosis, CVA, seizure disorder complicated by chronic respiratory failure with ventilator dependence and PEG tube placement who was admitted from [facility] for sepsis of unclear source.  Following admission, patient continued to have low-grade fevers.  Work-up has been unremarkable other than sputum culture growing out Acinetobacter and so patient has been on meropenem.  She is finally been afebrile since 2/6 evening.  Patient remains unresponsive to stimuli, her baseline apparently   Discharge Diagnoses:  Principal Problem:   Sepsis ([place]) Active Problems:   Essential (primary) hypertension   Pressure injury of skin   Seizures ([place])   A-fib ([place])   Chronic respiratory failure ([place])  #) Sepsis of unclear source: Patient continues to have low-grade fevers of unclear source -Procalcitonin undetectable - C. difficile negative, respiratory virus panel negative -Blood cultures 06/10/2018-negative -Urine culture 06/12/2018 after Foley was replaced negative - Sputum culture from 06/10/2018 growing out  Acinetobacter she was treated with meropenem. Now afebrile x 48 hours  #) Seizure disorder: -Continue levetiracetam thousand milligrams twice daily by tube -Continue valproic acid 250 mg twice daily by tube  #) Chronic respiratory failure status post tracheostomy: Follow-up with pulmonologist at the facility.  #) PEG tube dependence: -Continue free water flushes 200 mL's every 8 hours - Vital 1.2 @ 65 mL/hr with 30 mL Prostat BID  #) Chronic atrial fibrillation complicated by CVA: -Continue beta-blocker -Continue apixaban 2.5 mg twice daily  #) Hypertension/hyperlipidemia/coronary artery disease status post stent: -Continue aspirin 81 mg -Continue carvedilol 6.25 mg twice daily  #) Anemia: This appears to be stable at this time. Suspect most likely iron deficiency anemia and anemia of chronic disease.  #) Nephrolithiasis/hydronephrosis status post stents: Currently her creatinine is normal. -Low threshold to repeat CT abdomen   #) Chronic systolic heart failure: Last echo in February showed EF of 40 to 45%.  Remains euvolemic -Continue furosemide 40 mg daily via tube  #) Hyperkalemia her K was 6.2 which was repeated stat and the stat potassium level was 5.9.  She is not on any potassium replacements or anything that can increase potassium.  Recheck her levels tomorrow and treat accordingly.  She was given insulin and glucose for the hyperkalemia.    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 (Active)  04/18/18 1814  Location: Vertebral column  Location Orientation:   Staging: Stage IV - Full thickness tissue loss with exposed bone, tendon or muscle.  Wound Description (Comments):   Present on Admission: Yes     Pressure Injury 04/18/18 Unstageable - Full thickness tissue loss in which the base of the ulcer is covered by slough (yellow, tan, gray, green or brown) and/or eschar (tan, brown or black) in  the wound  bed. Has tunneling at 79 o clock, yellow slough aro (Active)  04/18/18 1815  Location: Sacrum  Location Orientation:   Staging: Unstageable - Full thickness tissue loss in which the base of the ulcer is covered by slough (yellow, tan, gray, green or brown) and/or eschar (tan, brown or black) in the wound bed.  Wound Description (Comments): Has tunneling at 63 o clock, yellow slough around edges of wound bed  Present on Admission: Yes     Pressure Injury 06/10/18 Stage IV - Full thickness tissue loss with exposed bone, tendon or muscle. bone palpable  (Active)  06/10/18 1700  Location: Sacrum  Location Orientation: Mid;Upper  Staging: Stage IV - Full thickness tissue loss with exposed bone, tendon or muscle.  Wound Description (Comments): bone palpable   Present on Admission: Yes      Nutrition Problem: Inadequate oral intake Etiology: inability to eat    Signs/Symptoms: NPO status     Interventions: Tube feeding, Juven  Estimated body mass index is 28.99 kg/m as calculated from the following:   Height as of this encounter: 5\' 4"  (1.626 m).   Weight as of this encounter: 76.6 kg.  Discharge Instructions  Discharge Instructions    Call MD for:  difficulty breathing, headache or visual disturbances   Complete by:  As directed    Call MD for:  persistant dizziness or light-headedness   Complete by:  As directed    Call MD for:  severe uncontrolled pain   Complete by:  As directed    Diet - low sodium heart healthy   Complete by:  As directed    Increase activity slowly   Complete by:  As directed      Allergies as of 06/17/2018   No Known Allergies     Medication List    TAKE these medications   acetaminophen 325 MG tablet Commonly known as:  TYLENOL Take 2 tablets (650 mg total) by mouth every 6 (six) hours as needed for mild pain (or Fever >/= 101).   aspirin 81 MG chewable tablet Chew 81 mg by mouth daily.   carvedilol 6.25 MG tablet Commonly known as:   COREG Take 1 tablet (6.25 mg total) by mouth 2 (two) times daily with a meal.   chlorhexidine 0.12 % solution Commonly known as:  PERIDEX Use as directed 15 mLs in the mouth or throat 2 (two) times daily.   collagenase ointment Commonly known as:  SANTYL Apply topically daily. Start taking on:  June 18, 2018   diazepam 5 MG tablet Commonly known as:  VALIUM Take 2.5 mg by mouth every 12 (twelve) hours.   famotidine 20 MG tablet Commonly known as:  PEPCID Place 20 mg into feeding tube daily.   feeding supplement (PRO-STAT SUGAR FREE 64) Liqd Place 30 mLs into feeding tube 2 (two) times daily.   free water Soln Place 200 mLs into feeding tube every 4 (four) hours.   furosemide 40 MG tablet Commonly known as:  LASIX Take 1 tablet (40 mg total) by mouth daily.   HYDROcodone-acetaminophen 5-325 MG tablet Commonly known as:  NORCO/VICODIN Place 1 tablet into feeding tube every 8 (eight) hours as needed for moderate pain.   ipratropium-albuterol 0.5-2.5 (3) MG/3ML Soln Commonly known as:  DUONEB Take 3 mLs by nebulization every 6 (six) hours as needed (SOB).   levETIRAcetam 100 MG/ML solution Commonly known as:  KEPPRA Take 10 mLs (1,000 mg total) by mouth 2 (two) times daily.  What changed:  how to take this   multivitamin Liqd Place 15 mLs into feeding tube daily.   nutrition supplement (JUVEN) Pack Place 1 packet into feeding tube 2 (two) times daily between meals.   Valproate Sodium 250 MG/5ML Soln solution Commonly known as:  DEPAKENE Take 10 mLs (500 mg total) by mouth 2 (two) times daily. What changed:  how much to take      Follow-up Information    [name], MD Follow up.   Specialty:  Family Medicine Contact information: [address] [place] [address] [phone]          No Known Allergies  Consultations:  pccm   Procedures/Studies: Dg Chest Port 1 View  Result Date: 06/13/2018 CLINICAL DATA:  Fever of unknown  origin EXAM: PORTABLE CHEST 1 VIEW COMPARISON:  Three days ago FINDINGS: Tracheostomy tube in place. Right upper extremity PICC with tip at the upper cavoatrial junction. Low volume chest with haziness and mild left-sided streaky density. There may be layering pleural fluid on the left. No pneumothorax. Percutaneous gastrostomy tube. IMPRESSION: 1. Stable hardware positioning. 2. Low volume chest with stable mild hazy density favoring atelectasis. Electronically Signed   By: Monte Fantasia M.D.   On: 06/13/2018 06:32   Dg Chest Portable 1 View  Result Date: 06/10/2018 CLINICAL DATA:  Fever and tachycardia today. EXAM: PORTABLE CHEST 1 VIEW COMPARISON:  Single-view of the chest 04/23/2018, 04/19/2018, 08/24/2017 and 06/28/2017. FINDINGS: Tracheostomy tube and right PICC are in place. Left basilar atelectasis is seen. The right lung is clear. There is cardiomegaly. Aortic atherosclerosis is identified. No pneumothorax or pleural effusion. IMPRESSION: No acute disease. Electronically Signed   By: Inge Rise M.D.   On: 06/10/2018 12:12    (Echo, Carotid, EGD, Colonoscopy, ERCP)    Subjective: Resting in bed does not open her eyes when I call her name when I saw her few months ago at [facility] she would open her eyes when I called her name.  Discharge Exam: Vitals:   06/17/18 0800 06/17/18 0930  BP: 117/65 108/66  Pulse: 78 77  Resp: 15 (!) 22  Temp:    SpO2: 98% 99%   Vitals:   06/17/18 0713 06/17/18 0747 06/17/18 0800 06/17/18 0930  BP:  108/68 117/65 108/66  Pulse:  81 78 77  Resp:  16 15 (!) 22  Temp: 99.6 F (37.6 C)     TempSrc: Axillary     SpO2:  99% 98% 99%  Weight:      Height:        General:  not in acute distress Cardiovascular: RRR, S1/S2 +, no rubs, no gallops Respiratory: CTA bilaterally, no wheezing, no rhonchi Abdominal: Soft, NT, ND, bowel sounds + Extremities: no edema, no cyanosis    The results of significant diagnostics from this hospitalization  (including imaging, microbiology, ancillary and laboratory) are listed below for reference.     Microbiology: Recent Results (from the past 240 hour(s))  Blood Culture (routine x 2)     Status: None   Collection Time: 06/10/18 11:38 AM  Result Value Ref Range Status   Specimen Description BLOOD RIGHT ANTECUBITAL  Final   Special Requests   Final    BOTTLES DRAWN AEROBIC AND ANAEROBIC Blood Culture adequate volume   Culture   Final    NO GROWTH 5 DAYS Performed at [facility], [address] [place], [place] [address]    Report Status 06/15/2018 FINAL  Final  Urine culture     Status: Abnormal   Collection Time: 06/10/18 11:38 AM  Result Value Ref Range Status   Specimen Description URINE, RANDOM  Final   Special Requests   Final    Normal Performed at [facility], [place] [address], [place], [place] [address]    Culture MULTIPLE SPECIES PRESENT, SUGGEST RECOLLECTION (A)  Final   Report Status 06/11/2018 FINAL  Final  Respiratory Panel by PCR     Status: None   Collection Time: 06/10/18 11:39 AM  Result Value Ref Range Status   Adenovirus NOT DETECTED NOT DETECTED Final   Coronavirus 229E NOT DETECTED NOT DETECTED Final   Coronavirus HKU1 NOT DETECTED NOT DETECTED Final   Coronavirus NL63 NOT DETECTED NOT DETECTED Final   Coronavirus OC43 NOT DETECTED NOT DETECTED Final   Metapneumovirus NOT DETECTED NOT DETECTED Final   Rhinovirus / Enterovirus NOT DETECTED NOT DETECTED Final   Influenza A NOT DETECTED NOT DETECTED Final   Influenza B NOT DETECTED NOT DETECTED Final   Parainfluenza Virus 1 NOT DETECTED NOT DETECTED Final   Parainfluenza Virus 2 NOT DETECTED NOT DETECTED Final   Parainfluenza Virus 3 NOT DETECTED NOT DETECTED Final   Parainfluenza Virus 4 NOT DETECTED NOT DETECTED Final   Respiratory Syncytial Virus NOT DETECTED NOT DETECTED Final   Bordetella pertussis NOT DETECTED NOT DETECTED Final   Chlamydophila pneumoniae NOT DETECTED NOT DETECTED Final    Mycoplasma pneumoniae NOT DETECTED NOT DETECTED Final  Blood Culture (routine x 2)     Status: None   Collection Time: 06/10/18 11:43 AM  Result Value Ref Range Status   Specimen Description BLOOD BLOOD LEFT HAND  Final   Special Requests   Final    BOTTLES DRAWN AEROBIC AND ANAEROBIC Blood Culture adequate volume   Culture   Final    NO GROWTH 5 DAYS Performed at [facility], [place] [address], [place], [place] [address]    Report Status 06/15/2018 FINAL  Final  MRSA PCR Screening     Status: None   Collection Time: 06/10/18  5:09 PM  Result Value Ref Range Status   MRSA by PCR NEGATIVE NEGATIVE Final    Comment:        The GeneXpert MRSA Assay (FDA approved for NASAL specimens only), is one component of a comprehensive MRSA colonization surveillance program. It is not intended to diagnose MRSA infection nor to guide or monitor treatment for MRSA infections. Performed at [facility], [place] [address], [place], [place] [address]   Culture, respiratory (non-expectorated)     Status: None   Collection Time: 06/10/18  5:16 PM  Result Value Ref Range Status   Specimen Description TRACHEAL ASPIRATE  Final   Special Requests   Final    NONE Performed at [facility], [place] [address], [place], [place] [address]    Gram Stain   Final    ABUNDANT WBC PRESENT, PREDOMINANTLY PMN FEW GRAM POSITIVE RODS RARE GRAM POSITIVE COCCI RARE GRAM NEGATIVE RODS    Culture FEW ACINETOBACTER CALCOACETICUS/BAUMANNII COMPLEX  Final   Report Status 06/13/2018 FINAL  Final   Organism ID, Bacteria ACINETOBACTER CALCOACETICUS/BAUMANNII COMPLEX  Final      Susceptibility   Acinetobacter calcoaceticus/baumannii complex - MIC*    CEFTAZIDIME >=64 RESISTANT Resistant     CEFTRIAXONE >=64 RESISTANT Resistant     CIPROFLOXACIN >=4 RESISTANT Resistant     GENTAMICIN <=1 SENSITIVE Sensitive     IMIPENEM 1  SENSITIVE Sensitive     PIP/TAZO >=128 RESISTANT Resistant     TRIMETH/SULFA >=320  RESISTANT Resistant     CEFEPIME 16 INTERMEDIATE Intermediate     AMPICILLIN/SULBACTAM 4 SENSITIVE Sensitive     * FEW ACINETOBACTER CALCOACETICUS/BAUMANNII COMPLEX  C difficile quick scan w PCR reflex     Status: None   Collection Time: 06/12/18  6:25 PM  Result Value Ref Range Status   C Diff antigen NEGATIVE NEGATIVE Final   C Diff toxin NEGATIVE NEGATIVE Final   C Diff interpretation No C. difficile detected.  Final    Comment: Performed at [facility], [place] [address] [place] [address], [place], [place] [address]  Urine Culture     Status: None   Collection Time: 06/12/18  6:25 PM  Result Value Ref Range Status   Specimen Description URINE, CATHETERIZED  Final   Special Requests NONE  Final   Culture   Final    NO GROWTH Performed at [facility], [address], [place], [place] [address]    Report Status 06/13/2018 FINAL  Final     Labs: BNP (last 3 results) No results for input(s): BNP in the last 8760 hours. Basic Metabolic Panel: Recent Labs  Lab 06/12/18 0207  06/14/18 0423 06/15/18 0411 06/16/18 0513 06/17/18 0431 06/17/18 0803  NA 145   < > 139 138 137 134* 135  K 3.7   < > 4.0 4.4 4.3 6.2* 5.9*  CL 111   < > 105 102 100 96* 98  CO2 26   < > 26 26 28 29 28   GLUCOSE 98   < > 106* 114* 103* 98 104*  BUN 52*   < > 35* 36* 35* 38* 37*  CREATININE 0.44   < > 0.38* 0.39* 0.39* 0.36* 0.38*  CALCIUM 9.3   < > 9.4 9.5 9.3 9.3 9.3  MG 2.1  --   --   --   --   --   --   PHOS 2.5  --   --   --   --   --   --    < > = values in this interval not displayed.   Liver Function Tests: Recent Labs  Lab 06/10/18 1145 06/12/18 0207 06/13/18 0346 06/14/18 0423  AST 21 17 17 15   ALT 17 12 13 14   ALKPHOS 72 56 65 64  BILITOT 0.5 0.7 0.8 0.6  PROT 6.6 5.3* 5.7* 5.4*  ALBUMIN 2.4* 2.0* 2.1* 1.9*   No results for input(s): LIPASE, AMYLASE in the last 168 hours. No results for input(s): AMMONIA in the last 168 hours. CBC: Recent Labs  Lab 06/10/18 1145  06/13/18 0346  06/14/18 0423 06/15/18 0411 06/16/18 0513 06/17/18 0431  WBC 16.5*   < > 7.8 6.9 9.5 7.6 8.7  NEUTROABS 14.8*  --   --   --   --   --   --   HGB 9.5*   < > 8.4* 7.8* 8.5* 8.5* 8.6*  HCT 36.8   < > 31.0* 28.7* 31.2* 30.6* 31.4*  MCV 96.6   < > 93.7 94.1 93.7 95.0 96.0  PLT 365   < > 289 286 303 285 296   < > = values in this interval not displayed.   Cardiac Enzymes: No results for input(s): CKTOTAL, CKMB, CKMBINDEX, TROPONINI in the last 168 hours. BNP: Invalid input(s): POCBNP CBG: Recent Labs  Lab 06/16/18 1526 06/16/18 2007 06/16/18 2357 06/17/18 0453 06/17/18 9935  GLUCAP  96 107* 100* 96 108*   D-Dimer No results for input(s): DDIMER in the last 72 hours. Hgb A1c No results for input(s): HGBA1C in the last 72 hours. Lipid Profile No results for input(s): CHOL, HDL, LDLCALC, TRIG, CHOLHDL, LDLDIRECT in the last 72 hours. Thyroid function studies No results for input(s): TSH, T4TOTAL, T3FREE, THYROIDAB in the last 72 hours.  Invalid input(s): FREET3 Anemia work up No results for input(s): VITAMINB12, FOLATE, FERRITIN, TIBC, IRON, RETICCTPCT in the last 72 hours. Urinalysis    Component Value Date/Time   COLORURINE YELLOW 06/10/2018 1145   APPEARANCEUR CLOUDY (A) 06/10/2018 1145   APPEARANCEUR Cloudy (A) 01/29/2016 1126   LABSPEC 1.012 06/10/2018 1145   LABSPEC 1.020 01/23/2014 1047   PHURINE 5.0 06/10/2018 1145   GLUCOSEU NEGATIVE 06/10/2018 1145   GLUCOSEU NEGATIVE 01/23/2014 1047   HGBUR SMALL (A) 06/10/2018 1145   BILIRUBINUR NEGATIVE 06/10/2018 1145   BILIRUBINUR Negative 01/13/2016 1110   BILIRUBINUR NEGATIVE 01/23/2014 1047   KETONESUR NEGATIVE 06/10/2018 1145   PROTEINUR NEGATIVE 06/10/2018 1145   UROBILINOGEN 0.2 08/04/2015 1140   NITRITE POSITIVE (A) 06/10/2018 1145   LEUKOCYTESUR LARGE (A) 06/10/2018 1145   LEUKOCYTESUR 3+ (A) 01/13/2016 1110   LEUKOCYTESUR TRACE 01/23/2014 1047   Sepsis Labs Invalid input(s): PROCALCITONIN,  WBC,   LACTICIDVEN Microbiology Recent Results (from the past 240 hour(s))  Blood Culture (routine x 2)     Status: None   Collection Time: 06/10/18 11:38 AM  Result Value Ref Range Status   Specimen Description BLOOD RIGHT ANTECUBITAL  Final   Special Requests   Final    BOTTLES DRAWN AEROBIC AND ANAEROBIC Blood Culture adequate volume   Culture   Final    NO GROWTH 5 DAYS Performed at [facility], [address] [place], [place] [address]    Report Status 06/15/2018 FINAL  Final  Urine culture     Status: Abnormal   Collection Time: 06/10/18 11:38 AM  Result Value Ref Range Status   Specimen Description URINE, RANDOM  Final   Special Requests   Final    Normal Performed at [facility], [place] [address], [place], [place] [address]    Culture MULTIPLE SPECIES PRESENT, SUGGEST RECOLLECTION (A)  Final   Report Status 06/11/2018 FINAL  Final  Respiratory Panel by PCR     Status: None   Collection Time: 06/10/18 11:39 AM  Result Value Ref Range Status   Adenovirus NOT DETECTED NOT DETECTED Final   Coronavirus 229E NOT DETECTED NOT DETECTED Final   Coronavirus HKU1 NOT DETECTED NOT DETECTED Final   Coronavirus NL63 NOT DETECTED NOT DETECTED Final   Coronavirus OC43 NOT DETECTED NOT DETECTED Final   Metapneumovirus NOT DETECTED NOT DETECTED Final   Rhinovirus / Enterovirus NOT DETECTED NOT DETECTED Final   Influenza A NOT DETECTED NOT DETECTED Final   Influenza B NOT DETECTED NOT DETECTED Final   Parainfluenza Virus 1 NOT DETECTED NOT DETECTED Final   Parainfluenza Virus 2 NOT DETECTED NOT DETECTED Final   Parainfluenza Virus 3 NOT DETECTED NOT DETECTED Final   Parainfluenza Virus 4 NOT DETECTED NOT DETECTED Final   Respiratory Syncytial Virus NOT DETECTED NOT DETECTED Final   Bordetella pertussis NOT DETECTED NOT DETECTED Final   Chlamydophila pneumoniae NOT DETECTED NOT DETECTED Final   Mycoplasma pneumoniae NOT DETECTED NOT DETECTED Final  Blood Culture (routine x 2)      Status: None   Collection Time: 06/10/18 11:43 AM  Result Value Ref Range  Status   Specimen Description BLOOD BLOOD LEFT HAND  Final   Special Requests   Final    BOTTLES DRAWN AEROBIC AND ANAEROBIC Blood Culture adequate volume   Culture   Final    NO GROWTH 5 DAYS Performed at [facility], [address] [place], [place] [address]    Report Status 06/15/2018 FINAL  Final  MRSA PCR Screening     Status: None   Collection Time: 06/10/18  5:09 PM  Result Value Ref Range Status   MRSA by PCR NEGATIVE NEGATIVE Final    Comment:        The GeneXpert MRSA Assay (FDA approved for NASAL specimens only), is one component of a comprehensive MRSA colonization surveillance program. It is not intended to diagnose MRSA infection nor to guide or monitor treatment for MRSA infections. Performed at [facility], [place] [address] [place], [place] [address]   Culture, respiratory (non-expectorated)     Status: None   Collection Time: 06/10/18  5:16 PM  Result Value Ref Range Status   Specimen Description TRACHEAL ASPIRATE  Final   Special Requests   Final    NONE Performed at [facility], [place] [address], [place], [place] [address]    Gram Stain   Final    ABUNDANT WBC PRESENT, PREDOMINANTLY PMN FEW GRAM POSITIVE RODS RARE GRAM POSITIVE COCCI RARE GRAM NEGATIVE RODS    Culture FEW ACINETOBACTER CALCOACETICUS/BAUMANNII COMPLEX  Final   Report Status 06/13/2018 FINAL  Final   Organism ID, Bacteria ACINETOBACTER CALCOACETICUS/BAUMANNII COMPLEX  Final      Susceptibility   Acinetobacter calcoaceticus/baumannii complex - MIC*    CEFTAZIDIME >=64 RESISTANT Resistant     CEFTRIAXONE >=64 RESISTANT Resistant     CIPROFLOXACIN >=4 RESISTANT Resistant     GENTAMICIN <=1 SENSITIVE Sensitive     IMIPENEM 1 SENSITIVE Sensitive     PIP/TAZO >=128 RESISTANT Resistant     TRIMETH/SULFA >=320 RESISTANT Resistant     CEFEPIME 16 INTERMEDIATE Intermediate     AMPICILLIN/SULBACTAM  4 SENSITIVE Sensitive     * FEW ACINETOBACTER CALCOACETICUS/BAUMANNII COMPLEX  C difficile quick scan w PCR reflex     Status: None   Collection Time: 06/12/18  6:25 PM  Result Value Ref Range Status   C Diff antigen NEGATIVE NEGATIVE Final   C Diff toxin NEGATIVE NEGATIVE Final   C Diff interpretation No C. difficile detected.  Final    Comment: Performed at [facility], [place] [address], [place], [place] [address]  Urine Culture     Status: None   Collection Time: 06/12/18  6:25 PM  Result Value Ref Range Status   Specimen Description URINE, CATHETERIZED  Final   Special Requests NONE  Final   Culture   Final    NO GROWTH Performed at [facility], [address] [place], [place] [address]    Report Status 06/13/2018 FINAL  Final     Time coordinating discharge: 34 minutes  SIGNED:   Georgette Shell, MD  [facility] 06/17/2018, 10:00 AM Pager   If 7PM-7AM, please contact night-coverage [email or web address] Password [email or web address]

## 2018-06-17 NOTE — Progress Notes (Signed)
CRITICAL VALUE ALERT  Critical Value: K+ 6.2  Provider Notified: Bodenheimer, NP notified 2/8 @ [phone]  Patient resting in bed. Vitals stable.

## 2018-06-17 NOTE — Progress Notes (Signed)
CSW called [facility], unfortunately they do not have a bed available today. Weekend staff is minimal. Please follow up with Erline Levine, admissions coordinator on Monday.   CSW will continue to follow.   Domenic Schwab, MSW, [place]

## 2018-06-18 ENCOUNTER — Encounter (HOSPITAL_COMMUNITY): Payer: Self-pay

## 2018-06-18 ENCOUNTER — Other Ambulatory Visit: Payer: Self-pay

## 2018-06-18 LAB — GLUCOSE, CAPILLARY
Glucose-Capillary: 103 mg/dL — ABNORMAL HIGH (ref 70–99)
Glucose-Capillary: 103 mg/dL — ABNORMAL HIGH (ref 70–99)
Glucose-Capillary: 89 mg/dL (ref 70–99)
Glucose-Capillary: 92 mg/dL (ref 70–99)
Glucose-Capillary: 95 mg/dL (ref 70–99)
Glucose-Capillary: 97 mg/dL (ref 70–99)

## 2018-06-18 LAB — BASIC METABOLIC PANEL
Anion gap: 9 (ref 5–15)
BUN: 39 mg/dL — ABNORMAL HIGH (ref 8–23)
CO2: 30 mmol/L (ref 22–32)
Calcium: 9.4 mg/dL (ref 8.9–10.3)
Chloride: 97 mmol/L — ABNORMAL LOW (ref 98–111)
Creatinine, Ser: 0.33 mg/dL — ABNORMAL LOW (ref 0.44–1.00)
GFR calc Af Amer: >60 mL/min (ref >60)
GFR calc non Af Amer: >60 mL/min (ref >60)
Glucose, Bld: 105 mg/dL — ABNORMAL HIGH (ref 70–99)
Potassium: 4.7 mmol/L (ref 3.5–5.1)
Sodium: 136 mmol/L (ref 135–145)

## 2018-06-18 LAB — CBC
HCT: 30.8 % — ABNORMAL LOW (ref 36.0–46.0)
HEMOGLOBIN: 8.5 g/dL — AB (ref 12.0–15.0)
MCH: 26.4 pg (ref 26.0–34.0)
MCHC: 27.6 g/dL — ABNORMAL LOW (ref 30.0–36.0)
MCV: 95.7 fL (ref 80.0–100.0)
Platelets: 287 10*3/uL (ref 150–400)
RBC: 3.22 MIL/uL — ABNORMAL LOW (ref 3.87–5.11)
RDW: 20.6 % — ABNORMAL HIGH (ref 11.5–15.5)
WBC: 8.9 10*3/uL (ref 4.0–10.5)
nRBC: 0.2 % (ref 0.0–0.2)

## 2018-06-18 NOTE — Progress Notes (Signed)
PROGRESS NOTE    Melissa Cochran  GTX:[id number] DOB: 06/22/1935 DOA: 06/10/2018 PCP: [name], MD   Brief Narrative: :83 year old with past medical history relevant for chronic atrial fibrillation on apixaban, hypertension, coronary artery disease status post stent in 2482, chronic systolic heart failure with EF of 45 to 50% by echo on 06/28/2017, nephrolithiasis complicated by hydronephrosis status post stent placement in 2017, history of bilateral hydronephrosis, CVA, seizure disorder complicated by chronic respiratory failure with ventilator dependence and PEG tube placement who was admitted from [facility] for sepsis of unclear source.  Following admission, patient continued to have low-grade fevers. Work-up has been unremarkable other than sputum culture growing out Acinetobacter and so patient has been on meropenem. She is finally been afebrile since 2/6 evening. Patient remains unresponsive to stimuli, her baseline apparently  Assessment & Plan:   Principal Problem:   Sepsis ([place]) Active Problems:   Essential (primary) hypertension   Pressure injury of skin   Seizures (HCC)   A-fib (HCC)   Chronic respiratory failure (HCC)   Acute cystitis without hematuria   #) Sepsis-C. difficile negative, respiratory virus panel negative -Blood cultures 06/10/2018-negative -Urine culture 06/12/2018 after Foley was replaced negative -Sputum culture from 06/10/2018 growing out Acinetobacter she was treated with meropenem. Last documented fever was 06/10/2018 patient can be discharged back to [facility] on Monday.  They were not able to accept the patient this weekend as they did not have a female bed.  #) Seizure disorder: -Continue levetiracetam thousand milligrams twice daily by tube -Continue valproic acid 250 mg twice daily by tube  #) Chronic respiratory failure status post tracheostomy:Follow-up with pulmonologist at the facility.  #) PEG tube dependence: -Continue  free water flushes 200 mL's every 8 hours -Vital 1.2 @ 65 mL/hr with 30 mL Prostat BID  #) Chronic atrial fibrillation complicated by CVA: -Continue beta-blocker -Continue apixaban 2.5 mg twice daily  #) Hypertension/hyperlipidemia/coronary artery disease status post stent: -Continue aspirin 81 mg -Continue carvedilol 6.25 mg twice daily  #) Anemia: This appears to be stable at this time. Suspect most likely iron deficiency anemia and anemia of chronic disease.  #) Nephrolithiasis/hydronephrosis status post stents: Currently her creatinine is normal. -Low threshold to repeat CT abdomen   #) Chronic systolic heart failure: Last echo in February showed EF of 40 to 45%. Remains euvolemic -Continue furosemide 40 mg daily via tube  #)Hyperkalemia resolved.   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 (Active)  04/18/18 1814  Location: Vertebral column  Location Orientation:   Staging: Stage IV - Full thickness tissue loss with exposed bone, tendon or muscle.  Wound Description (Comments):   Present on Admission: Yes     Pressure Injury 04/18/18 Unstageable - Full thickness tissue loss in which the base of the ulcer is covered by slough (yellow, tan, gray, green or brown) and/or eschar (tan, brown or black) in the wound bed. Has tunneling at 42 o clock, yellow slough aro (Active)  04/18/18 1815  Location: Sacrum  Location Orientation:   Staging: Unstageable - Full thickness tissue loss in which the base of the ulcer is covered by slough (yellow, tan, gray, green or brown) and/or eschar (tan, brown or black) in the wound bed.  Wound Description (Comments): Has tunneling at 61 o clock, yellow slough around edges of wound bed  Present on Admission: Yes     Pressure Injury 06/10/18 Stage IV - Full thickness tissue loss  with exposed bone, tendon or muscle. bone palpable  (Active)  06/10/18 1700  Location: Sacrum  Location  Orientation: Mid;Upper  Staging: Stage IV - Full thickness tissue loss with exposed bone, tendon or muscle.  Wound Description (Comments): bone palpable   Present on Admission: Yes      Nutrition Problem: Inadequate oral intake Etiology: inability to eat     Signs/Symptoms: NPO status    Interventions: Tube feeding, Juven  Estimated body mass index is 28.61 kg/m as calculated from the following:   Height as of this encounter: '5\' 4"'$  (1.626 m).   Weight as of this encounter: 75.6 kg.  DVT prophylaxis: Apixaban Code Status: Full code Family Communication: None Disposition Plan: Discharge back to [facility] to 06/19/2018 Monday   Consultants: PCCM   Procedures: None Antimicrobials none Subjective: Response to pain resting in bed eyes closed  Objective: Vitals:   06/18/18 0802 06/18/18 0827 06/18/18 0900 06/18/18 0935  BP: (!) 96/57 (!) 96/57 (!) 97/58 113/73  Pulse: 69 77 71 77  Resp: '13 13 15 20  '$ Temp:      TempSrc:      SpO2: 99% 100% 100% 100%  Weight:      Height:        Intake/Output Summary (Last 24 hours) at 06/18/2018 1035 Last data filed at 06/18/2018 0900 Gross per 24 hour  Intake 4223.88 ml  Output 2250 ml  Net 1973.88 ml   Filed Weights   06/16/18 0500 06/17/18 0500 06/18/18 0155  Weight: 76.6 kg 76.6 kg 75.6 kg    Examination: Rectal tube trach PEG tube in place Foley in place  General exam: Appears calm and comfortable  Respiratory system: Scattered rhonchi to auscultation. Respiratory effort normal. Cardiovascular system: S1 & S2 heard, RRR. No JVD, murmurs, rubs, gallops or clicks. No pedal edema. Gastrointestinal system: Abdomen is nondistended, soft and nontender. No organomegaly or masses felt. Normal bowel sounds heard. Extremities: 1+ pitting edema.      Data Reviewed: I have personally reviewed following labs and imaging studies  CBC: Recent Labs  Lab 06/14/18 0423 06/15/18 0411 06/16/18 0513 06/17/18 0431 06/18/18 0500   WBC 6.9 9.5 7.6 8.7 8.9  HGB 7.8* 8.5* 8.5* 8.6* 8.5*  HCT 28.7* 31.2* 30.6* 31.4* 30.8*  MCV 94.1 93.7 95.0 96.0 95.7  PLT 286 303 285 296 003   Basic Metabolic Panel: Recent Labs  Lab 06/12/18 0207  06/15/18 0411 06/16/18 0513 06/17/18 0431 06/17/18 0803 06/18/18 0500  NA 145   < > 138 137 134* 135 136  K 3.7   < > 4.4 4.3 6.2* 5.9* 4.7  CL 111   < > 102 100 96* 98 97*  CO2 26   < > '26 28 29 28 30  '$ GLUCOSE 98   < > 114* 103* 98 104* 105*  BUN 52*   < > 36* 35* 38* 37* 39*  CREATININE 0.44   < > 0.39* 0.39* 0.36* 0.38* 0.33*  CALCIUM 9.3   < > 9.5 9.3 9.3 9.3 9.4  MG 2.1  --   --   --   --   --   --   PHOS 2.5  --   --   --   --   --   --    < > = values in this interval not displayed.   GFR: Estimated Creatinine Clearance: 55.9 mL/min (A) (by C-G formula based on SCr of 0.33 mg/dL (L)). Liver Function Tests: Recent Labs  Lab  06/12/18 0207 06/13/18 0346 06/14/18 0423  AST '17 17 15  '$ ALT '12 13 14  '$ ALKPHOS 56 65 64  BILITOT 0.7 0.8 0.6  PROT 5.3* 5.7* 5.4*  ALBUMIN 2.0* 2.1* 1.9*   No results for input(s): LIPASE, AMYLASE in the last 168 hours. No results for input(s): AMMONIA in the last 168 hours. Coagulation Profile: No results for input(s): INR, PROTIME in the last 168 hours. Cardiac Enzymes: No results for input(s): CKTOTAL, CKMB, CKMBINDEX, TROPONINI in the last 168 hours. BNP (last 3 results) No results for input(s): PROBNP in the last 8760 hours. HbA1C: No results for input(s): HGBA1C in the last 72 hours. CBG: Recent Labs  Lab 06/17/18 1540 06/17/18 1927 06/17/18 2306 06/18/18 0257 06/18/18 0716  GLUCAP 96 97 106* 92 89   Lipid Profile: No results for input(s): CHOL, HDL, LDLCALC, TRIG, CHOLHDL, LDLDIRECT in the last 72 hours. Thyroid Function Tests: No results for input(s): TSH, T4TOTAL, FREET4, T3FREE, THYROIDAB in the last 72 hours. Anemia Panel: No results for input(s): VITAMINB12, FOLATE, FERRITIN, TIBC, IRON, RETICCTPCT in the last 72  hours. Sepsis Labs: Recent Labs  Lab 06/15/18 0411 06/16/18 0513 06/17/18 0431  PROCALCITON <0.10 <0.10 <0.10    Recent Results (from the past 240 hour(s))  Blood Culture (routine x 2)     Status: None   Collection Time: 06/10/18 11:38 AM  Result Value Ref Range Status   Specimen Description BLOOD RIGHT ANTECUBITAL  Final   Special Requests   Final    BOTTLES DRAWN AEROBIC AND ANAEROBIC Blood Culture adequate volume   Culture   Final    NO GROWTH 5 DAYS Performed at [facility], [address], [place], [place] [address]    Report Status 06/15/2018 FINAL  Final  Urine culture     Status: Abnormal   Collection Time: 06/10/18 11:38 AM  Result Value Ref Range Status   Specimen Description URINE, RANDOM  Final   Special Requests   Final    Normal Performed at [facility], [place] [address] [place], [place] [address]    Culture MULTIPLE SPECIES PRESENT, SUGGEST RECOLLECTION (A)  Final   Report Status 06/11/2018 FINAL  Final  Respiratory Panel by PCR     Status: None   Collection Time: 06/10/18 11:39 AM  Result Value Ref Range Status   Adenovirus NOT DETECTED NOT DETECTED Final   Coronavirus 229E NOT DETECTED NOT DETECTED Final   Coronavirus HKU1 NOT DETECTED NOT DETECTED Final   Coronavirus NL63 NOT DETECTED NOT DETECTED Final   Coronavirus OC43 NOT DETECTED NOT DETECTED Final   Metapneumovirus NOT DETECTED NOT DETECTED Final   Rhinovirus / Enterovirus NOT DETECTED NOT DETECTED Final   Influenza A NOT DETECTED NOT DETECTED Final   Influenza B NOT DETECTED NOT DETECTED Final   Parainfluenza Virus 1 NOT DETECTED NOT DETECTED Final   Parainfluenza Virus 2 NOT DETECTED NOT DETECTED Final   Parainfluenza Virus 3 NOT DETECTED NOT DETECTED Final   Parainfluenza Virus 4 NOT DETECTED NOT DETECTED Final   Respiratory Syncytial Virus NOT DETECTED NOT DETECTED Final   Bordetella pertussis NOT DETECTED NOT DETECTED Final   Chlamydophila pneumoniae NOT DETECTED NOT DETECTED  Final   Mycoplasma pneumoniae NOT DETECTED NOT DETECTED Final  Blood Culture (routine x 2)     Status: None   Collection Time: 06/10/18 11:43 AM  Result Value Ref Range Status   Specimen Description BLOOD BLOOD LEFT HAND  Final   Special Requests  Final    BOTTLES DRAWN AEROBIC AND ANAEROBIC Blood Culture adequate volume   Culture   Final    NO GROWTH 5 DAYS Performed at [facility], [place] [address], [place], [place] [address]    Report Status 06/15/2018 FINAL  Final  MRSA PCR Screening     Status: None   Collection Time: 06/10/18  5:09 PM  Result Value Ref Range Status   MRSA by PCR NEGATIVE NEGATIVE Final    Comment:        The GeneXpert MRSA Assay (FDA approved for NASAL specimens only), is one component of a comprehensive MRSA colonization surveillance program. It is not intended to diagnose MRSA infection nor to guide or monitor treatment for MRSA infections. Performed at [facility], [place] [address] [place], [place] [address]   Culture, respiratory (non-expectorated)     Status: None   Collection Time: 06/10/18  5:16 PM  Result Value Ref Range Status   Specimen Description TRACHEAL ASPIRATE  Final   Special Requests   Final    NONE Performed at [facility], [place] [address], [place], [place] [address]    Gram Stain   Final    ABUNDANT WBC PRESENT, PREDOMINANTLY PMN FEW GRAM POSITIVE RODS RARE GRAM POSITIVE COCCI RARE GRAM NEGATIVE RODS    Culture FEW ACINETOBACTER CALCOACETICUS/BAUMANNII COMPLEX  Final   Report Status 06/13/2018 FINAL  Final   Organism ID, Bacteria ACINETOBACTER CALCOACETICUS/BAUMANNII COMPLEX  Final      Susceptibility   Acinetobacter calcoaceticus/baumannii complex - MIC*    CEFTAZIDIME >=64 RESISTANT Resistant     CEFTRIAXONE >=64 RESISTANT Resistant     CIPROFLOXACIN >=4 RESISTANT Resistant     GENTAMICIN <=1 SENSITIVE Sensitive     IMIPENEM 1 SENSITIVE Sensitive     PIP/TAZO >=128 RESISTANT Resistant      TRIMETH/SULFA >=320 RESISTANT Resistant     CEFEPIME 16 INTERMEDIATE Intermediate     AMPICILLIN/SULBACTAM 4 SENSITIVE Sensitive     * FEW ACINETOBACTER CALCOACETICUS/BAUMANNII COMPLEX  C difficile quick scan w PCR reflex     Status: None   Collection Time: 06/12/18  6:25 PM  Result Value Ref Range Status   C Diff antigen NEGATIVE NEGATIVE Final   C Diff toxin NEGATIVE NEGATIVE Final   C Diff interpretation No C. difficile detected.  Final    Comment: Performed at [facility], [place] [address] [place], [place] [address]  Urine Culture     Status: None   Collection Time: 06/12/18  6:25 PM  Result Value Ref Range Status   Specimen Description URINE, CATHETERIZED  Final   Special Requests NONE  Final   Culture   Final    NO GROWTH Performed at [facility], [address], [place], [place] [address]    Report Status 06/13/2018 FINAL  Final         Radiology Studies: No results found.      Scheduled Meds: . apixaban  2.5 mg Per Tube BID  . aspirin  81 mg Oral Daily  . carvedilol  6.25 mg Per Tube BID WC  . chlorhexidine gluconate (MEDLINE KIT)  15 mL Mouth Rinse BID  . Chlorhexidine Gluconate Cloth  6 each Topical Daily  . collagenase   Topical Daily  . diazepam  2.5 mg Per Tube Q12H  . famotidine  20 mg Per Tube QHS  . free water  200 mL Per Tube Q4H  . furosemide  40 mg  Oral Daily  . insulin aspart  0-9 Units Subcutaneous Q4H  . levETIRAcetam  1,000 mg Per Tube BID  . mouth rinse  15 mL Mouth Rinse 10 times per day  . multivitamin  15 mL Per Tube Daily  . nutrition supplement (JUVEN)  1 packet Per Tube BID BM  . sodium chloride flush  3 mL Intravenous Q12H  . valproic acid  250 mg Per Tube BID   Continuous Infusions: . sodium chloride 10 mL/hr at 06/18/18 0700  . feeding supplement (VITAL AF 1.2 CAL) 65 mL/hr at 06/18/18 0700     LOS: 8 days     Georgette Shell, MD [facility]  If 7PM-7AM, please contact  night-coverage [email or web address] Password [email or web address] 06/18/2018, 10:35 AM

## 2018-06-19 DIAGNOSIS — G9340 Encephalopathy, unspecified: Secondary | ICD-10-CM

## 2018-06-19 LAB — GLUCOSE, CAPILLARY
GLUCOSE-CAPILLARY: 97 mg/dL (ref 70–99)
Glucose-Capillary: 100 mg/dL — ABNORMAL HIGH (ref 70–99)
Glucose-Capillary: 100 mg/dL — ABNORMAL HIGH (ref 70–99)
Glucose-Capillary: 108 mg/dL — ABNORMAL HIGH (ref 70–99)
Glucose-Capillary: 111 mg/dL — ABNORMAL HIGH (ref 70–99)
Glucose-Capillary: 115 mg/dL — ABNORMAL HIGH (ref 70–99)

## 2018-06-19 MED ORDER — VITAL AF 1.2 CAL PO LIQD: 1000.0000 mL | ORAL | Status: DC

## 2018-06-19 MED ORDER — CARVEDILOL 6.25 MG PO TABS: 6.2500 mg | ORAL_TABLET | Freq: Two times a day (BID) | ORAL | Status: DC

## 2018-06-19 MED ORDER — VALPROATE SODIUM 250 MG/5ML PO SOLN: 250.0000 mg | Freq: Two times a day (BID) | ORAL | Status: AC

## 2018-06-19 MED ORDER — DIAZEPAM 5 MG PO TABS: 2.5000 mg | ORAL_TABLET | Freq: Two times a day (BID) | ORAL | 0 refills | Status: DC

## 2018-06-19 MED ORDER — JUVEN PO PACK: 1.0000 | PACK | Freq: Two times a day (BID) | ORAL | 0 refills | Status: DC

## 2018-06-19 MED ORDER — ACETAMINOPHEN 160 MG/5ML PO SOLN: 650.0000 mg | Freq: Four times a day (QID) | ORAL | 0 refills | Status: DC | PRN

## 2018-06-19 MED ORDER — VALPROATE SODIUM 250 MG/5ML PO SOLN: 250.0000 mg | Freq: Two times a day (BID) | ORAL | Status: DC

## 2018-06-19 MED ORDER — ADULT MULTIVITAMIN LIQUID CH: 15.0000 mL | Freq: Every day | ORAL | Status: DC

## 2018-06-19 MED ORDER — LEVETIRACETAM 100 MG/ML PO SOLN: 1000.0000 mg | Freq: Two times a day (BID) | ORAL | Status: AC

## 2018-06-19 MED ORDER — APIXABAN 2.5 MG PO TABS: 2.5000 mg | ORAL_TABLET | Freq: Two times a day (BID) | ORAL | Status: DC

## 2018-06-19 MED ORDER — FUROSEMIDE 40 MG PO TABS: 40.0000 mg | ORAL_TABLET | Freq: Every day | ORAL | Status: DC

## 2018-06-19 MED ORDER — ASPIRIN 81 MG PO CHEW: 81.0000 mg | CHEWABLE_TABLET | Freq: Every day | ORAL | Status: AC

## 2018-06-19 NOTE — Discharge Summary (Signed)
DISCHARGE SUMMARY  Melissa Cochran DVV:[id number] DOB: 1935/08/14 DOA: 06/10/2018  PCP: [name], MD  Admit date: 06/10/2018 Discharge date: 06/19/2018  Admitted From: [facility]  Disposition: [facility]   Discharge Condition: stable and improved CODE STATUS:  FULL CODE  Diet recommendation: Tube feeds  Brief Summary: 83 year old with past medical history relevant for chronic atrial fibrillation on apixaban, hypertension, coronary artery disease status post stent in 6269, chronic systolic heart failure with EF of 45 to 50% by echo on 06/28/2017, nephrolithiasis complicated by hydronephrosis status post stent placement in 2017, history of bilateral hydronephrosis, CVA, seizure disorder complicated by chronic respiratory failure with ventilator dependence and PEG tube placement who was admitted from [facility] for sepsis of unclear source.  Following admission, patient continued to have low-grade fevers.  Work-up was unremarkable other than sputum culture growing out Acinetobacter and so patient completed a course of meropenem.  She has been afebrile since 2/6 evening. Patient remains unresponsive to stimuli, her reported baseline.    Discharge Diagnoses:  Sepsis of unclear source Seizure disorder Chronic respiratory failure status post tracheostomy PEG tube dependence Chronic atrial fibrillation complicated by CVA Hypertension Hyperlipidemia Coronary artery disease status post stent Anemia Nephrolithiasis/hydronephrosis status post stents Chronic systolic heart failure   Sepsis of unclear source -Procalcitonin undetectable -C. difficile negative, respiratory virus panel negative -Blood cultures 06/10/2018-negative -Urine culture 06/12/2018 after Foley was replaced negative -Sputum culture from 06/10/2018 growing out Acinetobacter > treated with meropenem -afebrile x 48 hours+ at time of d/c   Seizure disorder: -Continue levetiracetam 1000mg  twice daily by  tube -Continue valproic acid 250 mg twice daily by tube  Chronic respiratory failure status post tracheostomy -stable on vent   PEG tube dependence: -Continue free water flushes 200 mL's every 8 hours - Vital 1.2 @ 65 mL/hr with 30 mL Prostat BID  Chronic atrial fibrillation complicated by CVA -Continue beta-blocker -Continue apixaban 2.5 mg twice daily  Hypertension/hyperlipidemia/coronary artery disease status post stent -Continue aspirin 81 mg -Continue carvedilol 6.25 mg twice daily  Anemia -stable at this time -most likely iron deficiency anemia and anemia of chronic disease  Nephrolithiasis/hydronephrosis status post stents -creatinine is normal.  Chronic systolic heart failure -TTE February showed EF of 40 to 45%.  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 (Active)  04/18/18 1814  Location: Vertebral column  Location Orientation:   Staging: Stage IV - Full thickness tissue loss with exposed bone, tendon or muscle.  Wound Description (Comments):   Present on Admission: Yes     Pressure Injury 04/18/18 Unstageable - Full thickness tissue loss in which the base of the ulcer is covered by slough (yellow, tan, gray, green or brown) and/or eschar (tan, brown or black) in the wound bed. Has tunneling at 26 o clock, yellow slough aro (Active)  04/18/18 1815  Location: Sacrum  Location Orientation:   Staging: Unstageable - Full thickness tissue loss in which the base of the ulcer is covered by slough (yellow, tan, gray, green or brown) and/or eschar (tan, brown or black) in the wound bed.  Wound Description (Comments): Has tunneling at 20 o clock, yellow slough around edges of wound bed  Present on Admission: Yes     Pressure Injury 06/10/18 Stage IV - Full thickness tissue loss with exposed bone, tendon or muscle. bone palpable  (Active)  06/10/18  1700  Location: Sacrum  Location Orientation: Mid;Upper  Staging: Stage IV - Full thickness tissue loss with exposed bone, tendon or muscle.  Wound Description (Comments): bone palpable   Present on Admission: Yes    Estimated body mass index is 29.02 kg/m as calculated from the following:   Height as of this encounter: 5\' 4"  (1.626 m).   Weight as of this encounter: 76.7 kg.  Discharge Instructions  Allergies as of 06/19/2018   No Known Allergies     Medication List    STOP taking these medications   acetaminophen 325 MG tablet Commonly known as:  TYLENOL Replaced by:  acetaminophen 160 MG/5ML solution   feeding supplement (PRO-STAT SUGAR FREE 64) Liqd     TAKE these medications   acetaminophen 160 MG/5ML solution Commonly known as:  TYLENOL Place 20.3 mLs (650 mg total) into feeding tube every 6 (six) hours as needed for mild pain, headache or fever. Replaces:  acetaminophen 325 MG tablet   apixaban 2.5 MG Tabs tablet Commonly known as:  ELIQUIS Place 1 tablet (2.5 mg total) into feeding tube 2 (two) times daily.   aspirin 81 MG chewable tablet Place 1 tablet (81 mg total) into feeding tube daily. What changed:  how to take this   carvedilol 6.25 MG tablet Commonly known as:  COREG Place 1 tablet (6.25 mg total) into feeding tube 2 (two) times daily with a meal. What changed:  how to take this   chlorhexidine 0.12 % solution Commonly known as:  PERIDEX Use as directed 15 mLs in the mouth or throat 2 (two) times daily.   collagenase ointment Commonly known as:  SANTYL Apply topically daily.   diazepam 5 MG tablet Commonly known as:  VALIUM Place 0.5 tablets (2.5 mg total) into feeding tube every 12 (twelve) hours. What changed:  how to take this   famotidine 20 MG tablet Commonly known as:  PEPCID Place 20 mg into feeding tube daily.   free water Soln Place 200 mLs into feeding tube every 4 (four) hours.   furosemide 40 MG tablet Commonly known as:   LASIX Place 1 tablet (40 mg total) into feeding tube daily. What changed:  how to take this   HYDROcodone-acetaminophen 5-325 MG tablet Commonly known as:  NORCO/VICODIN Place 1 tablet into feeding tube every 8 (eight) hours as needed for moderate pain.   ipratropium-albuterol 0.5-2.5 (3) MG/3ML Soln Commonly known as:  DUONEB Take 3 mLs by nebulization every 6 (six) hours as needed (SOB).   levETIRAcetam 100 MG/ML solution Commonly known as:  KEPPRA Place 10 mLs (1,000 mg total) into feeding tube 2 (two) times daily.   multivitamin Liqd Place 15 mLs into feeding tube daily.   nutrition supplement (JUVEN) Pack Place 1 packet into feeding tube 2 (two) times daily between meals. What changed:  Another medication with the same name was added. Make sure you understand how and when to take each.   feeding supplement (VITAL AF 1.2 CAL) Liqd Place 1,000 mLs into feeding tube continuous. What changed:  You were already taking a medication with the same name, and this prescription was added. Make sure you understand how and when to take each.   Valproate Sodium 250 MG/5ML Soln solution Commonly known as:  DEPAKENE Place 5 mLs (250 mg total) into feeding tube 2 (two) times daily. What changed:    how much to take  how to take this      Follow-up Information    [name], MD Follow up.  Specialty:  Family Medicine Contact information: [address] [place] [address] [phone]          No Known Allergies  Consultations: PCCM  Subjective: The pt will open her eyes to the examiner, but does not communicate. She does not appear to be in acute resp distress or uncontrolled pain.   Discharge Exam: Vitals:   06/19/18 0800 06/19/18 0900  BP: (!) 98/54 (!) 102/57  Pulse: 64 69  Resp: 12 13  Temp:    SpO2: 100% 100%   Vitals:   06/19/18 0722 06/19/18 0736 06/19/18 0800 06/19/18 0900  BP: 107/67 107/67 (!) 98/54 (!) 102/57  Pulse: 67 70 64 69   Resp: 13  12 13   Temp:      TempSrc:      SpO2: 98% 99% 100% 100%  Weight:      Height:        General: No acute respiratory distress on vent  Lungs: Clear to auscultation bilaterally without wheezes or crackles Cardiovascular: Regular rate without murmur gallop or rub  Abdomen: Nondistended, soft, bowel sounds positive, no rebound, no ascites - PEG insertion clean and dry  Extremities: No significant cyanosis, clubbing, or edema bilateral lower extremities   Microbiology: Recent Results (from the past 240 hour(s))  Blood Culture (routine x 2)     Status: None   Collection Time: 06/10/18 11:38 AM  Result Value Ref Range Status   Specimen Description BLOOD RIGHT ANTECUBITAL  Final   Special Requests   Final    BOTTLES DRAWN AEROBIC AND ANAEROBIC Blood Culture adequate volume   Culture   Final    NO GROWTH 5 DAYS Performed at [facility], [address], [place], [place] [address]    Report Status 06/15/2018 FINAL  Final  Urine culture     Status: Abnormal   Collection Time: 06/10/18 11:38 AM  Result Value Ref Range Status   Specimen Description URINE, RANDOM  Final   Special Requests   Final    Normal Performed at [facility], [place] [address], [place], [place] [address]    Culture MULTIPLE SPECIES PRESENT, SUGGEST RECOLLECTION (A)  Final   Report Status 06/11/2018 FINAL  Final  Respiratory Panel by PCR     Status: None   Collection Time: 06/10/18 11:39 AM  Result Value Ref Range Status   Adenovirus NOT DETECTED NOT DETECTED Final   Coronavirus 229E NOT DETECTED NOT DETECTED Final   Coronavirus HKU1 NOT DETECTED NOT DETECTED Final   Coronavirus NL63 NOT DETECTED NOT DETECTED Final   Coronavirus OC43 NOT DETECTED NOT DETECTED Final   Metapneumovirus NOT DETECTED NOT DETECTED Final   Rhinovirus / Enterovirus NOT DETECTED NOT DETECTED Final   Influenza A NOT DETECTED NOT DETECTED Final   Influenza B NOT DETECTED NOT DETECTED Final   Parainfluenza Virus 1  NOT DETECTED NOT DETECTED Final   Parainfluenza Virus 2 NOT DETECTED NOT DETECTED Final   Parainfluenza Virus 3 NOT DETECTED NOT DETECTED Final   Parainfluenza Virus 4 NOT DETECTED NOT DETECTED Final   Respiratory Syncytial Virus NOT DETECTED NOT DETECTED Final   Bordetella pertussis NOT DETECTED NOT DETECTED Final   Chlamydophila pneumoniae NOT DETECTED NOT DETECTED Final   Mycoplasma pneumoniae NOT DETECTED NOT DETECTED Final  Blood Culture (routine x 2)     Status: None   Collection Time: 06/10/18 11:43 AM  Result Value Ref Range Status   Specimen Description BLOOD BLOOD LEFT HAND  Final  Special Requests   Final    BOTTLES DRAWN AEROBIC AND ANAEROBIC Blood Culture adequate volume   Culture   Final    NO GROWTH 5 DAYS Performed at [facility], [place] [address] [place], [place] [address]    Report Status 06/15/2018 FINAL  Final  MRSA PCR Screening     Status: None   Collection Time: 06/10/18  5:09 PM  Result Value Ref Range Status   MRSA by PCR NEGATIVE NEGATIVE Final    Comment:        The GeneXpert MRSA Assay (FDA approved for NASAL specimens only), is one component of a comprehensive MRSA colonization surveillance program. It is not intended to diagnose MRSA infection nor to guide or monitor treatment for MRSA infections. Performed at [facility], [place] [address], [place], [place] [address]   Culture, respiratory (non-expectorated)     Status: None   Collection Time: 06/10/18  5:16 PM  Result Value Ref Range Status   Specimen Description TRACHEAL ASPIRATE  Final   Special Requests   Final    NONE Performed at [facility], [place] [address], [place], [place] [address]    Gram Stain   Final    ABUNDANT WBC PRESENT, PREDOMINANTLY PMN FEW GRAM POSITIVE RODS RARE GRAM POSITIVE COCCI RARE GRAM NEGATIVE RODS    Culture FEW ACINETOBACTER CALCOACETICUS/BAUMANNII COMPLEX  Final   Report Status 06/13/2018 FINAL  Final   Organism ID, Bacteria ACINETOBACTER  CALCOACETICUS/BAUMANNII COMPLEX  Final      Susceptibility   Acinetobacter calcoaceticus/baumannii complex - MIC*    CEFTAZIDIME >=64 RESISTANT Resistant     CEFTRIAXONE >=64 RESISTANT Resistant     CIPROFLOXACIN >=4 RESISTANT Resistant     GENTAMICIN <=1 SENSITIVE Sensitive     IMIPENEM 1 SENSITIVE Sensitive     PIP/TAZO >=128 RESISTANT Resistant     TRIMETH/SULFA >=320 RESISTANT Resistant     CEFEPIME 16 INTERMEDIATE Intermediate     AMPICILLIN/SULBACTAM 4 SENSITIVE Sensitive     * FEW ACINETOBACTER CALCOACETICUS/BAUMANNII COMPLEX  C difficile quick scan w PCR reflex     Status: None   Collection Time: 06/12/18  6:25 PM  Result Value Ref Range Status   C Diff antigen NEGATIVE NEGATIVE Final   C Diff toxin NEGATIVE NEGATIVE Final   C Diff interpretation No C. difficile detected.  Final    Comment: Performed at [facility], [place] [address], [place], [place] [address]  Urine Culture     Status: None   Collection Time: 06/12/18  6:25 PM  Result Value Ref Range Status   Specimen Description URINE, CATHETERIZED  Final   Special Requests NONE  Final   Culture   Final    NO GROWTH Performed at [facility], [address], [place],  [address]    Report Status 06/13/2018 FINAL  Final     Labs:  Basic Metabolic Panel: Recent Labs  Lab 06/15/18 0411 06/16/18 0513 06/17/18 0431 06/17/18 0803 06/18/18 0500  NA 138 137 134* 135 136  K 4.4 4.3 6.2* 5.9* 4.7  CL 102 100 96* 98 97*  CO2 26 28 29 28 30   GLUCOSE 114* 103* 98 104* 105*  BUN 36* 35* 38* 37* 39*  CREATININE 0.39* 0.39* 0.36* 0.38* 0.33*  CALCIUM 9.5 9.3 9.3 9.3 9.4   Liver Function Tests: Recent Labs  Lab 06/13/18 0346 06/14/18 0423  AST 17 15  ALT 13 14  ALKPHOS 65 64  BILITOT 0.8 0.6  PROT 5.7* 5.4*  ALBUMIN 2.1* 1.9*   CBC: Recent Labs  Lab 06/14/18 0423 06/15/18 0411 06/16/18 0513 06/17/18 0431 06/18/18 0500  WBC 6.9 9.5 7.6 8.7 8.9  HGB 7.8* 8.5* 8.5* 8.6* 8.5*  HCT 28.7*  31.2* 30.6* 31.4* 30.8*  MCV 94.1 93.7 95.0 96.0 95.7  PLT 286 303 285 296 287   CBG: Recent Labs  Lab 06/18/18 1515 06/18/18 1955 06/18/18 2342 06/19/18 0350 06/19/18 0714  GLUCAP 95 97 103* 108* 97   Urinalysis    Component Value Date/Time   COLORURINE YELLOW 06/10/2018 1145   APPEARANCEUR CLOUDY (A) 06/10/2018 1145   APPEARANCEUR Cloudy (A) 01/29/2016 1126   LABSPEC 1.012 06/10/2018 1145   LABSPEC 1.020 01/23/2014 1047   PHURINE 5.0 06/10/2018 1145   GLUCOSEU NEGATIVE 06/10/2018 1145   GLUCOSEU NEGATIVE 01/23/2014 1047   HGBUR SMALL (A) 06/10/2018 1145   BILIRUBINUR NEGATIVE 06/10/2018 1145   BILIRUBINUR Negative 01/13/2016 1110   BILIRUBINUR NEGATIVE 01/23/2014 1047   KETONESUR NEGATIVE 06/10/2018 1145   PROTEINUR NEGATIVE 06/10/2018 1145   UROBILINOGEN 0.2 08/04/2015 1140   NITRITE POSITIVE (A) 06/10/2018 1145   LEUKOCYTESUR LARGE (A) 06/10/2018 1145   LEUKOCYTESUR 3+ (A) 01/13/2016 1110   LEUKOCYTESUR TRACE 01/23/2014 1047    Time coordinating discharge: 35 minutes   Cherene Altes, MD  [facility] 06/19/2018, 9:16 AM

## 2018-06-19 NOTE — Consult Note (Signed)
   [facility] CM Inpatient Consult   06/19/2018  ZOUA CAPORASO 11-07-1935 [id number]   Patient screened for high risk score for unplanned readmissions and hospitalizations to check if potential [facility] services are needed . Patient was hospitalized for sepsis, patient is trach dependent and from a skilled facility in which she is long term care. . Chart review reveals patient is to return to skilled nursing facility.  No community care management needs for post hospital follow up.  For questions contact:   Natividad Brood, RN BSN [facility] Liaison  [phone] business mobile phone Toll free office [phone]

## 2018-06-19 NOTE — Progress Notes (Signed)
CSW aware that per MD note, disposition plan is for pt to return back to [facility] today. CSW has followed up with Erline Levine from [facility] regarding bed. CSW left voicemail asking that she call CSW back ASAP. CSW will remain involved for further needs.     Virgie Dad Tamma Brigandi, MSW, [facility] Clinical Social Worker [phone]

## 2018-06-19 NOTE — Progress Notes (Signed)
CSW spoke with Melissa Cochran from [facility] and was informed that there are no beds available as of today. Melissa Cochran expressed that she would follow up with CSW once ned is available.    Melissa Cochran Ania Levay, MSW, [facility] Clinical Social Worker [phone]

## 2018-06-20 LAB — GLUCOSE, CAPILLARY
GLUCOSE-CAPILLARY: 90 mg/dL (ref 70–99)
GLUCOSE-CAPILLARY: 91 mg/dL (ref 70–99)
Glucose-Capillary: 96 mg/dL (ref 70–99)
Glucose-Capillary: 102 mg/dL — ABNORMAL HIGH (ref 70–99)
Glucose-Capillary: 125 mg/dL — ABNORMAL HIGH (ref 70–99)

## 2018-06-20 MED ORDER — ASPIRIN 81 MG PO CHEW
81.0000 mg | CHEWABLE_TABLET | Freq: Every day | ORAL | Status: DC
  Administered 2018-06-21 – 2018-06-26 (×6): 81 mg
  Filled 2018-06-20 (×6): qty 1

## 2018-06-20 MED ORDER — FUROSEMIDE 40 MG PO TABS
40.0000 mg | ORAL_TABLET | Freq: Every day | ORAL | Status: DC
  Administered 2018-06-21 – 2018-06-26 (×6): 40 mg
  Filled 2018-06-20 (×6): qty 1

## 2018-06-20 NOTE — Progress Notes (Signed)
[place] TEAM 1 - Stepdown/ICU TEAM  Melissa Cochran  PTW:[id number] DOB: Jun 10, 1935 DOA: 06/10/2018 PCP: [name], MD    Brief Narrative:  83yo F w/ a hx of chronic atrial fibrillation on apixaban, HTN, CAD s/p stent in 7001, chronic systolic CHF with EF of 74-94% 06/28/2017, nephrolithiasis complicated by hydronephrosis status post stent placement in 2017, CVA, seizure disorder, and chronic respiratory failure with ventilator and PEG dependence who presented from [facility] for fever. At baseline, on "good days" she reportedly opens her eyes, but does not otherwise communicate. In the ED she was noted to have a sacral ulcer with purulence. A CXR was unrevealing.   Subjective: Awaiting bed at [facility] for D/C. No change in clincal condition. Appears stable. No evidence of resp distress.   Assessment & Plan:  Sepsis of unclear source  -Procalcitonin undetectable -C. difficile negative, respiratory virus panel negative -Blood cultures 06/10/2018 negative -Urine culture 06/12/2018 after Foley was replaced negative -Sputum culture from 06/10/2018 growing out Acinetobacter > treated with meropenem -afebrile x 48 hours+ at time of d/c   Seizure disorder -Continue levetiracetam 105m twice daily by tube -Continue valproic acid 250 mg twice daily by tube  Chronic respiratory failure status post tracheostomy -stable on vent   PEG tube dependence: -Continue free water flushes 200 mLs every 4 hours - Vital 1.2 @ 65 mL/hr with 30 mL Prostat BID  Chronic atrial fibrillation complicated by CVA -Continue beta-blocker -Continue apixaban 2.5 mg twice daily  Hypertension / Hyperlipidemia / CAD status post stent -Continue aspirin 81 mg -Continue carvedilol 6.25 mg twice daily  Anemia -stable at this time -most likely iron deficiency anemia and anemia of chronic disease  Nephrolithiasis/hydronephrosis status post stents -creatinine is normal  Chronic systolic heart  failure -TTE February showed EF of 40 to 45% - appears euvolemic -Continue furosemide 40 mg daily via tube  Decubitus ulcers  Stage 4 pressure injury:Sacrum: 3cm x 2.5cm x 0.5cm + Stage 3 pressure injury:Vertebral column: 5cm x 5.5cm x 1.5cm - care per WValierRN  Nutrition - PEG tube dependent Nutrition consulted  Torticollis  Goals of care Palliative Care was involved in the care of the patient during her last admit in December, at which time family insisted on ongoing full aggressive support (daugher was "okay with her mother not being able to [place] talk, walk, or even breathe on her own") - it appears to me that she has an exceedingly poor quality of life with an extremely low likelihood of making a meaningful recovery  DVT prophylaxis: Apixaban Code Status: FULL CODE Family Communication: no family present at time of exam  Disposition Plan: ICU on chronic vent - awaiting bed at [facility]   Consultants:  PCCM  Antimicrobials:  Meropenem 2/1 > 2/7 Cefepime 2/1  Vancomycin 2/1 + 2/3 > 2/5  Objective: Blood pressure (!) 100/59, pulse 77, temperature 98.3 F (36.8 C), temperature source Oral, resp. rate 16, height _0  (1.626 m), weight 80.3 kg, SpO2 100 %.  Intake/Output Summary (Last 24 hours) at 06/20/2018 1100 Last data filed at 06/20/2018 1000 Gross per 24 hour  Intake 2279.98 ml  Output 1525 ml  Net 754.98 ml   Filed Weights   06/18/18 0155 06/19/18 0438 06/20/18 0418  Weight: 75.6 kg 76.7 kg 80.3 kg    Examination: General: No acute respiratory distress - vent via trach  Lungs: CTA th/o  Cardiovascular: Irregularly irregular w/o M Abdomen: soft, NT/ND Extremities: trace edema B LE   CBC:  Recent Labs  Lab 06/16/18 0513 06/17/18 0431 06/18/18 0500  WBC 7.6 8.7 8.9  HGB 8.5* 8.6* 8.5*  HCT 30.6* 31.4* 30.8*  MCV 95.0 96.0 95.7  PLT 285 296 338   Basic Metabolic Panel: Recent Labs  Lab 06/17/18 0431 06/17/18 0803 06/18/18 0500  NA 134* 135 136  K  6.2* 5.9* 4.7  CL 96* 98 97*  CO2 _0 GLUCOSE 98 104* 105*  BUN 38* 37* 39*  CREATININE 0.36* 0.38* 0.33*  CALCIUM 9.3 9.3 9.4   GFR: Estimated Creatinine Clearance: 57.5 mL/min (A) (by C-G formula based on SCr of 0.33 mg/dL (L)).  Liver Function Tests: Recent Labs  Lab 06/14/18 0423  AST 15  ALT 14  ALKPHOS 64  BILITOT 0.6  PROT 5.4*  ALBUMIN 1.9*    HbA1C: Hgb A1c MFr Bld  Date/Time Value Ref Range Status  02/13/2017 09:35 PM 5.1 4.8 - 5.6 % Final    Comment:    (NOTE) Pre diabetes:          5.7%-6.4% Diabetes:              >6.4% Glycemic control for   <7.0% adults with diabetes     CBG: Recent Labs  Lab 06/19/18 1514 06/19/18 1926 06/19/18 2342 06/20/18 0421 06/20/18 0740  GLUCAP 100* 115* 100* 96 90    Recent Results (from the past 240 hour(s))  Blood Culture (routine x 2)     Status: None   Collection Time: 06/10/18 11:38 AM  Result Value Ref Range Status   Specimen Description BLOOD RIGHT ANTECUBITAL  Final   Special Requests   Final    BOTTLES DRAWN AEROBIC AND ANAEROBIC Blood Culture adequate volume   Culture   Final    NO GROWTH 5 DAYS Performed at [facility], [place] [address], [place], [place] [address]    Report Status 06/15/2018 FINAL  Final  Urine culture     Status: Abnormal   Collection Time: 06/10/18 11:38 AM  Result Value Ref Range Status   Specimen Description URINE, RANDOM  Final   Special Requests   Final    Normal Performed at [facility], [place] [address] [place], [place] [address]    Culture MULTIPLE SPECIES PRESENT, SUGGEST RECOLLECTION (A)  Final   Report Status 06/11/2018 FINAL  Final  Respiratory Panel by PCR     Status: None   Collection Time: 06/10/18 11:39 AM  Result Value Ref Range Status   Adenovirus NOT DETECTED NOT DETECTED Final   Coronavirus 229E NOT DETECTED NOT DETECTED Final   Coronavirus HKU1 NOT DETECTED NOT DETECTED Final   Coronavirus NL63 NOT DETECTED NOT DETECTED Final    Coronavirus OC43 NOT DETECTED NOT DETECTED Final   Metapneumovirus NOT DETECTED NOT DETECTED Final   Rhinovirus / Enterovirus NOT DETECTED NOT DETECTED Final   Influenza A NOT DETECTED NOT DETECTED Final   Influenza B NOT DETECTED NOT DETECTED Final   Parainfluenza Virus 1 NOT DETECTED NOT DETECTED Final   Parainfluenza Virus 2 NOT DETECTED NOT DETECTED Final   Parainfluenza Virus 3 NOT DETECTED NOT DETECTED Final   Parainfluenza Virus 4 NOT DETECTED NOT DETECTED Final   Respiratory Syncytial Virus NOT DETECTED NOT DETECTED Final   Bordetella pertussis NOT DETECTED NOT DETECTED Final   Chlamydophila pneumoniae NOT DETECTED NOT DETECTED Final   Mycoplasma pneumoniae NOT DETECTED NOT DETECTED Final  Blood Culture (routine x 2)     Status: None  Collection Time: 06/10/18 11:43 AM  Result Value Ref Range Status   Specimen Description BLOOD BLOOD LEFT HAND  Final   Special Requests   Final    BOTTLES DRAWN AEROBIC AND ANAEROBIC Blood Culture adequate volume   Culture   Final    NO GROWTH 5 DAYS Performed at [facility], [address] [place], [place] [address]    Report Status 06/15/2018 FINAL  Final  MRSA PCR Screening     Status: None   Collection Time: 06/10/18  5:09 PM  Result Value Ref Range Status   MRSA by PCR NEGATIVE NEGATIVE Final    Comment:        The GeneXpert MRSA Assay (FDA approved for NASAL specimens only), is one component of a comprehensive MRSA colonization surveillance program. It is not intended to diagnose MRSA infection nor to guide or monitor treatment for MRSA infections. Performed at [facility], [place] [address], [place], [place] [address]   Culture, respiratory (non-expectorated)     Status: None   Collection Time: 06/10/18  5:16 PM  Result Value Ref Range Status   Specimen Description TRACHEAL ASPIRATE  Final   Special Requests   Final    NONE Performed at [facility], [place] [address] [place], [place] [address]    Gram  Stain   Final    ABUNDANT WBC PRESENT, PREDOMINANTLY PMN FEW GRAM POSITIVE RODS RARE GRAM POSITIVE COCCI RARE GRAM NEGATIVE RODS    Culture FEW ACINETOBACTER CALCOACETICUS/BAUMANNII COMPLEX  Final   Report Status 06/13/2018 FINAL  Final   Organism ID, Bacteria ACINETOBACTER CALCOACETICUS/BAUMANNII COMPLEX  Final      Susceptibility   Acinetobacter calcoaceticus/baumannii complex - MIC*    CEFTAZIDIME >=64 RESISTANT Resistant     CEFTRIAXONE >=64 RESISTANT Resistant     CIPROFLOXACIN >=4 RESISTANT Resistant     GENTAMICIN <=1 SENSITIVE Sensitive     IMIPENEM 1 SENSITIVE Sensitive     PIP/TAZO >=128 RESISTANT Resistant     TRIMETH/SULFA >=320 RESISTANT Resistant     CEFEPIME 16 INTERMEDIATE Intermediate     AMPICILLIN/SULBACTAM 4 SENSITIVE Sensitive     * FEW ACINETOBACTER CALCOACETICUS/BAUMANNII COMPLEX  C difficile quick scan w PCR reflex     Status: None   Collection Time: 06/12/18  6:25 PM  Result Value Ref Range Status   C Diff antigen NEGATIVE NEGATIVE Final   C Diff toxin NEGATIVE NEGATIVE Final   C Diff interpretation No C. difficile detected.  Final    Comment: Performed at [facility], [place] [address] [place], [place] [address]  Urine Culture     Status: None   Collection Time: 06/12/18  6:25 PM  Result Value Ref Range Status   Specimen Description URINE, CATHETERIZED  Final   Special Requests NONE  Final   Culture   Final    NO GROWTH Performed at [facility], [address], [place], [place] [address]    Report Status 06/13/2018 FINAL  Final     Scheduled Meds: . apixaban  2.5 mg Per Tube BID  . aspirin  81 mg Oral Daily  . carvedilol  6.25 mg Per Tube BID WC  . chlorhexidine gluconate (MEDLINE KIT)  15 mL Mouth Rinse BID  . Chlorhexidine Gluconate Cloth  6 each Topical Daily  . collagenase   Topical Daily  . diazepam  2.5 mg Per Tube Q12H  . famotidine  20 mg Per Tube QHS  . free  water  200 mL Per Tube Q4H  . furosemide  40 mg Oral Daily  .  insulin aspart  0-9 Units Subcutaneous Q4H  . levETIRAcetam  1,000 mg Per Tube BID  . mouth rinse  15 mL Mouth Rinse 10 times per day  . multivitamin  15 mL Per Tube Daily  . nutrition supplement (JUVEN)  1 packet Per Tube BID BM  . sodium chloride flush  3 mL Intravenous Q12H  . valproic acid  250 mg Per Tube BID   Continuous Infusions: . sodium chloride 10 mL/hr at 06/20/18 1000  . feeding supplement (VITAL AF 1.2 CAL) 65 mL/hr at 06/20/18 1000     LOS: 10 days   Cherene Altes, MD [facility] Office  [phone] Pager - Text Page per Amion  If 7PM-7AM, please contact night-coverage per Amion 06/20/2018, 11:00 AM

## 2018-06-20 NOTE — Care Management Important Message (Signed)
Important Message  Patient Details  Name: Melissa Cochran MRN: [id number] Date of Birth: [birth year]   Medicare Important Message Given:  Yes    Erenest Rasher, RN 06/20/2018, 9:29 AM

## 2018-06-21 LAB — GLUCOSE, CAPILLARY
Glucose-Capillary: 104 mg/dL — ABNORMAL HIGH (ref 70–99)
Glucose-Capillary: 112 mg/dL — ABNORMAL HIGH (ref 70–99)
Glucose-Capillary: 83 mg/dL (ref 70–99)
Glucose-Capillary: 85 mg/dL (ref 70–99)
Glucose-Capillary: 98 mg/dL (ref 70–99)
Glucose-Capillary: 98 mg/dL (ref 70–99)
Glucose-Capillary: 99 mg/dL (ref 70–99)

## 2018-06-21 MED ORDER — PRO-STAT SUGAR FREE PO LIQD
30.0000 mL | Freq: Two times a day (BID) | ORAL | Status: DC
  Administered 2018-06-21 – 2018-06-26 (×11): 30 mL
  Filled 2018-06-21 (×11): qty 30

## 2018-06-21 MED ORDER — VITAL AF 1.2 CAL PO LIQD
1000.0000 mL | ORAL | Status: DC
  Administered 2018-06-22 – 2018-06-25 (×3): 1000 mL
  Filled 2018-06-21: qty 1000

## 2018-06-21 NOTE — Progress Notes (Signed)
PROGRESS NOTE    Melissa Cochran  EXN:[id number] DOB: 10/05/1935 DOA: 06/10/2018 PCP: [name], MD  Brief Narrative: 83 year old with past medical history relevant for chronic atrial fibrillation on apixaban, hypertension, coronary artery disease status post stent in 4967, chronic systolic heart failure with EF of 45 to 50% by echo on 06/28/2017, nephrolithiasis complicated by hydronephrosis status post stent placement in 2017, history of bilateral hydronephrosis, CVA, seizure disorder complicated by chronic respiratory failure with ventilator dependence and PEG tube placement who was admitted from [facility] for sepsis of unclear source.   Assessment & Plan:   Active Problems:   Essential (primary) hypertension   Pressure injury of skin   Seizures (HCC)   A-fib (HCC)   Chronic respiratory failure ([place])   #) Sepsis of unclear source: Resolved.  Attributed to sputum culture growing out Acinetobacter. -Procalcitonin undetectable - C. difficile negative, respiratory virus panel negative -Blood cultures 06/10/2018- -Urine culture 06/12/2018 after Foley was replaced negative - Sputum culture from 06/10/2018 growing out Acinetobacter, completed course of meropenem  #) Seizure disorder: -Continue levetiracetam thousand milligrams twice daily by tube -Continue valproic acid 250 mg twice daily by tube  #) Chronic respiratory failure status post tracheostomy: -PCCM following, appreciate t recommendations  #) PEG tube dependence: Resolved with free water flushes  -Continue free water flushes 200 mL's every 8 hours - Vital 1.2 @ 65 mL/hr with 30 mL Prostat BID  #) Chronic atrial fibrillation complicated by CVA: -Continue beta-blocker -Continue apixaban 2.5 mg twice daily  #) Hypertension/hyperlipidemia/coronary artery disease status post stent: -Continue aspirin 81 mg -Continue carvedilol 6.25 mg twice daily  #) Anemia: This appears to be stable at this time.  Suspect  most likely iron deficiency anemia and anemia of chronic disease.  #) Nephrolithiasis/hydronephrosis status post stents: Currently her creatinine is normal. -Low threshold to repeat CT abdomen   #) Chronic systolic heart failure: Last echo in February showed EF of 40 to 45% -Continue furosemide 40 mg daily via tube  Fluids: Free water flushes Electrolytes: Per above Nutrition: Per above  Prophylaxis: Apixaban  Disposition: Pending  discharged to [facility]  Full code   Consultants:   PCCM  Procedures:   None  Antimicrobials: IV meropenem started 06/10/2018 to 06/16/2018 IV cefepime 06/10/2018  IV vancomycin 06/10/2018 + 06/12/2018- 06/14/2018   Subjective: Patient is nonresponsive to stimuli.  She does not appear to be uncomfortable.  Objective: Vitals:   06/21/18 0728 06/21/18 0748 06/21/18 0800 06/21/18 1000  BP:  122/70 123/73 (!) 119/57  Pulse:  82 77 80  Resp:  '13 14 16  '$ Temp: 98.6 F (37 C)     TempSrc: Axillary     SpO2:  100% 98% 94%  Weight:      Height:        Intake/Output Summary (Last 24 hours) at 06/21/2018 1029 Last data filed at 06/21/2018 1000 Gross per 24 hour  Intake 2289.94 ml  Output 1750 ml  Net 539.94 ml   Filed Weights   06/19/18 0438 06/20/18 0418 06/21/18 0254  Weight: 76.7 kg 80.3 kg 79 kg    Examination:  General exam: No acute distress Respiratory system: Anterior lung sounds with scattered rhonchi Cardiovascular system: Distant heart sounds, regular rate and rhythm, no murmurs Gastrointestinal system: Soft, nondistended, no rebound or guarding, plus bowel sounds Central nervous system: Awake and appears to be oriented though cannot assess completely, left-sided hemiparesis, squeezes right hand and wiggle right feet, torticollis towards right side noted Extremities: Lower  extremity edema Skin: Tracheostomy site is clean dry and intact, PEG tube site is clean dry and intact Psychiatry: Unable to assess due to medical condition.      Data Reviewed: I have personally reviewed following labs and imaging studies  CBC: Recent Labs  Lab 06/15/18 0411 06/16/18 0513 06/17/18 0431 06/18/18 0500  WBC 9.5 7.6 8.7 8.9  HGB 8.5* 8.5* 8.6* 8.5*  HCT 31.2* 30.6* 31.4* 30.8*  MCV 93.7 95.0 96.0 95.7  PLT 303 285 296 510   Basic Metabolic Panel: Recent Labs  Lab 06/15/18 0411 06/16/18 0513 06/17/18 0431 06/17/18 0803 06/18/18 0500  NA 138 137 134* 135 136  K 4.4 4.3 6.2* 5.9* 4.7  CL 102 100 96* 98 97*  CO2 '26 28 29 28 30  '$ GLUCOSE 114* 103* 98 104* 105*  BUN 36* 35* 38* 37* 39*  CREATININE 0.39* 0.39* 0.36* 0.38* 0.33*  CALCIUM 9.5 9.3 9.3 9.3 9.4   GFR: Estimated Creatinine Clearance: 57 mL/min (A) (by C-G formula based on SCr of 0.33 mg/dL (L)). Liver Function Tests: No results for input(s): AST, ALT, ALKPHOS, BILITOT, PROT, ALBUMIN in the last 168 hours. No results for input(s): LIPASE, AMYLASE in the last 168 hours. No results for input(s): AMMONIA in the last 168 hours. Coagulation Profile: No results for input(s): INR, PROTIME in the last 168 hours. Cardiac Enzymes: No results for input(s): CKTOTAL, CKMB, CKMBINDEX, TROPONINI in the last 168 hours. BNP (last 3 results) No results for input(s): PROBNP in the last 8760 hours. HbA1C: No results for input(s): HGBA1C in the last 72 hours. CBG: Recent Labs  Lab 06/20/18 1504 06/20/18 1941 06/20/18 2350 06/21/18 0406 06/21/18 0726  GLUCAP 91 125* 85 83 99   Lipid Profile: No results for input(s): CHOL, HDL, LDLCALC, TRIG, CHOLHDL, LDLDIRECT in the last 72 hours. Thyroid Function Tests: No results for input(s): TSH, T4TOTAL, FREET4, T3FREE, THYROIDAB in the last 72 hours. Anemia Panel: No results for input(s): VITAMINB12, FOLATE, FERRITIN, TIBC, IRON, RETICCTPCT in the last 72 hours. Sepsis Labs: Recent Labs  Lab 06/15/18 0411 06/16/18 0513 06/17/18 0431  PROCALCITON <0.10 <0.10 <0.10    Recent Results (from the past 240 hour(s))  C  difficile quick scan w PCR reflex     Status: None   Collection Time: 06/12/18  6:25 PM  Result Value Ref Range Status   C Diff antigen NEGATIVE NEGATIVE Final   C Diff toxin NEGATIVE NEGATIVE Final   C Diff interpretation No C. difficile detected.  Final    Comment: Performed at [facility], [place] [address] [place], [place] [address]  Urine Culture     Status: None   Collection Time: 06/12/18  6:25 PM  Result Value Ref Range Status   Specimen Description URINE, CATHETERIZED  Final   Special Requests NONE  Final   Culture   Final    NO GROWTH Performed at [facility], [address], [place],  [address]    Report Status 06/13/2018 FINAL  Final         Radiology Studies: No results found.      Scheduled Meds: . apixaban  2.5 mg Per Tube BID  . aspirin  81 mg Per Tube Daily  . carvedilol  6.25 mg Per Tube BID WC  . chlorhexidine gluconate (MEDLINE KIT)  15 mL Mouth Rinse BID  . Chlorhexidine Gluconate Cloth  6 each Topical Daily  . collagenase   Topical Daily  . diazepam  2.5 mg  Per Tube Q12H  . famotidine  20 mg Per Tube QHS  . free water  200 mL Per Tube Q4H  . furosemide  40 mg Per Tube Daily  . insulin aspart  0-9 Units Subcutaneous Q4H  . levETIRAcetam  1,000 mg Per Tube BID  . mouth rinse  15 mL Mouth Rinse 10 times per day  . multivitamin  15 mL Per Tube Daily  . nutrition supplement (JUVEN)  1 packet Per Tube BID BM  . sodium chloride flush  3 mL Intravenous Q12H  . valproic acid  250 mg Per Tube BID   Continuous Infusions: . sodium chloride 10 mL/hr at 06/21/18 1000  . feeding supplement (VITAL AF 1.2 CAL) 1,000 mL (06/21/18 0523)     LOS: 11 days    Time spent: [place], MD [facility]  If 7PM-7AM, please contact night-coverage [email or web address] Password [facility] 06/21/2018, 10:29 AM

## 2018-06-21 NOTE — Progress Notes (Signed)
   NAME:  Melissa Cochran, MRN:  [id number], DOB:  July 16, 1935, LOS: 44 ADMISSION DATE:  06/10/2018, CONSULTATION DATE: 06/10/2018 REFERRING MD: Dr. Ronnald Nian, CHIEF COMPLAINT: Fever  Brief History   Patient is unresponsive, chronic trach from [facility].  All history was obtained from the patient's medical record.  I attempted to call the patient's family at the numbers provided within the chart and there was no answer.  83 year old female history of stroke status post tracheostomy, PEG tube, chronically bedbound patient of [facility] nursing LT[facility].  Patient with chronic indwelling Foley catheter presented to the hospital tachycardic and febrile.  EKG initially revealed H fibrillation with RVR she has a known sacral wound.  Patient was placed on IV antibiotics vancomycin and cefepime.  Pulmonary was consulted for recommendations regarding chronic tracheostomy tube.  And whether or not the patient would need to be in the intensive care unit. uation.   Past Medical History  Adnexal mass, anxiety, bladder tumor, CVA, CHF, bedbound, COPD, vent dependent, GERD,     [facility] admission 05/11/2018  Consults:  Critical care  Procedures:  None  Significant Diagnostic Tests:  Lactic acid 2.8 down to 1.6  Micro Data:  04/21/2018 sputum culture: Pseudomonas aeruginosa 04/20/2018: MRSA PCR positive  06/10/2018: Blood cultures no growth to date 06/10/2018: Urine culture pending  Antimicrobials:  Cefepime Vancomycin  Interim history/subjective:  Awaiting transfer to back to [facility]   Objective   Blood pressure 119/66, pulse 83, temperature 98.7 F (37.1 C), temperature source Axillary, resp. rate 12, height 5\' 4"  (1.626 m), weight 79 kg, SpO2 94 %.    Vent Mode: PRVC FiO2 (%):  [30 %] 30 % Set Rate:  [12 bmp] 12 bmp Vt Set:  [500 mL] 500 mL PEEP:  [5 cmH20] 5 cmH20 Plateau Pressure:  [21 cmH20-28 cmH20] 28 cmH20   Intake/Output Summary (Last 24 hours) at 06/21/2018  1518 Last data filed at 06/21/2018 1400 Gross per 24 hour  Intake 1824.96 ml  Output 1550 ml  Net 274.96 ml   Filed Weights   06/19/18 0438 06/20/18 0418 06/21/18 0254  Weight: 76.7 kg 80.3 kg 79 kg    Examination: General: chronically ill appearing currently on full vent support HENT trach unremarkable neck contracted, MMM Pulm scattered rhonchi, no accessory use; equal chest rise  Card RRR abd soft PEG unremarkable GU yellow urine Neuro opens eyes to stim; contracted.     Resolved Hospital Problem list   Severe sepsis syndrome (completed course of meropenem)   Assessment & Plan:   Chronic hypoxemic respiratory failure requiring tracheostomy tube and mechanical ventilatory support History of atrial fibrillation H/o cva History of chronic systolic heart failure-Prior echo in 2019 with EF 45 to 50% Elevated pulmonary pressures consistent with moderate pulmonary hypertension, peak PA pressures 56 mmHg Dysphagia  Sacral wound History of seizures Nephrolithiasis w/ hydronephrosis s/p stents Decub ulcers Torticollis   Discussion [facility]wait[facility]. Hemodynamically stable. Not weanable.    Plan/rec Cont current vent setting Routine trach care  Await transfer to [place] ACNP-BC [place] Pager # [phone] OR # [phone] if no answer

## 2018-06-21 NOTE — Progress Notes (Signed)
Nutrition Follow-up  DOCUMENTATION CODES:   Not applicable  INTERVENTION:   Continue TF via G-tube:  Vital AF 1.2 decrease rate to 45 ml/h  Pro-stat 30 ml increase to BID  Provides 1496 kcal, 111 gm protein, 876 ml free water daily.  Continue free water flushes 200 ml every 4 hours.  Continue Juven BID, provides CaHMB, glutamine, arginine, collagen protein, and micronutrients to promote wound healing.   NUTRITION DIAGNOSIS:   Inadequate oral intake related to inability to eat as evidenced by NPO status.  Ongoing   GOAL:   Patient will meet greater than or equal to 90% of their needs  Met with TF  MONITOR:   TF tolerance, Labs, Vent status, Skin  ASSESSMENT:   83 yo female with PMH of HTN, HLD, heart block, CHF, CAD, COPD, CVA, seizures, chronic VDRF, trach, PEG who was admitted from [facility] with fever, sepsis.   Patient remains on ventilator support MV: 10 L/min Temp (24hrs), Avg:98.5 F (36.9 C), Min:98.1 F (36.7 C), Max:98.7 F (37.1 C)   PEG in place, receiving Vital AF 1.2 at 65 ml/h, free water flushes 200 ml every 4 hours. Tolerating well at goal rate.   Labs reviewed. CBG's: 83-99-98 Medications reviewed and include Lasix, MVI, Juven, Novolog.   Diet Order:   Diet Order            Diet - low sodium heart healthy        Diet NPO time specified  Diet effective now              EDUCATION NEEDS:   No education needs have been identified at this time  Skin:  Skin Assessment: Skin Integrity Issues: Skin Integrity Issues:: Stage IV Stage IV: sacrum & vertebral column  Last BM:  2/12 (rectal tube)  Height:   Ht Readings from Last 1 Encounters:  06/10/18 5' 4" (1.626 m)    Weight:   Wt Readings from Last 1 Encounters:  06/21/18 79 kg    Ideal Body Weight:  54.5 kg  BMI:  Body mass index is 29.9 kg/m.  Estimated Nutritional Needs:   Kcal:  1500  Protein:  100-125 gm  Fluid:  > 1.5 L    , RD, LDN,  CNSC Pager [phone] After Hours Pager [phone]  

## 2018-06-22 LAB — GLUCOSE, CAPILLARY
GLUCOSE-CAPILLARY: 85 mg/dL (ref 70–99)
Glucose-Capillary: 88 mg/dL (ref 70–99)
Glucose-Capillary: 94 mg/dL (ref 70–99)
Glucose-Capillary: 93 mg/dL (ref 70–99)
Glucose-Capillary: 78 mg/dL (ref 70–99)
Glucose-Capillary: 93 mg/dL (ref 70–99)

## 2018-06-22 NOTE — Progress Notes (Signed)
PROGRESS NOTE    Melissa Cochran  CBU:[id number] DOB: 11-Nov-1935 DOA: 06/10/2018 PCP: [name], MD  Brief Narrative: 83 year old with past medical history relevant for chronic atrial fibrillation on apixaban, hypertension, coronary artery disease status post stent in 0321, chronic systolic heart failure with EF of 45 to 50% by echo on 06/28/2017, nephrolithiasis complicated by hydronephrosis status post stent placement in 2017, history of bilateral hydronephrosis, CVA, seizure disorder complicated by chronic respiratory failure with ventilator dependence and PEG tube placement who was admitted from [facility] for sepsis of unclear source.   Assessment & Plan:   Active Problems:   Essential (primary) hypertension   Pressure injury of skin   Seizures (HCC)   A-fib (HCC)   Chronic respiratory failure ([place])   #) Sepsis of unclear source: Resolved.  Attributed to sputum culture growing out Acinetobacter. -Procalcitonin undetectable - C. difficile negative, respiratory virus panel negative -Blood cultures 06/10/2018- -Urine culture 06/12/2018 after Foley was replaced negative - Sputum culture from 06/10/2018 growing out Acinetobacter, completed course of meropenem  #) Seizure disorder: -Continue levetiracetam thousand milligrams twice daily by tube -Continue valproic acid 250 mg twice daily by tube  #) Chronic respiratory failure status post tracheostomy: -PCCM following, appreciate t recommendations  #) PEG tube dependence: Resolved with free water flushes  -Continue free water flushes 200 mL's every 8 hours - Vital 1.2 @ 65 mL/hr with 30 mL Prostat BID  #) Chronic atrial fibrillation complicated by CVA: -Continue beta-blocker -Continue apixaban 2.5 mg twice daily  #) Hypertension/hyperlipidemia/coronary artery disease status post stent: -Continue aspirin 81 mg -Continue carvedilol 6.25 mg twice daily  #) Anemia: This appears to be stable at this time.  Suspect  most likely iron deficiency anemia and anemia of chronic disease.  #) Nephrolithiasis/hydronephrosis status post stents: Currently her creatinine is normal. -Low threshold to repeat CT abdomen   #) Chronic systolic heart failure: Last echo in February showed EF of 40 to 45% -Continue furosemide 40 mg daily via tube  Fluids: Free water flushes Electrolytes: Per above Nutrition: Per above  Prophylaxis: Apixaban  Disposition: Pending  discharged to [facility]  Full code   Consultants:   PCCM  Procedures:   None  Antimicrobials: IV meropenem started 06/10/2018 to 06/16/2018 IV cefepime 06/10/2018  IV vancomycin 06/10/2018 + 06/12/2018- 06/14/2018   Subjective: Patient is nonresponsive to stimuli.  She does not appear to be uncomfortable.  Objective: Vitals:   06/22/18 0500 06/22/18 0600 06/22/18 0726 06/22/18 0740  BP: 111/67 108/75  (!) 104/54  Pulse: 71 70  68  Resp: '13 12  12  '$ Temp:   98.3 F (36.8 C)   TempSrc:   Oral   SpO2: 96% 97%  97%  Weight:      Height:        Intake/Output Summary (Last 24 hours) at 06/22/2018 1031 Last data filed at 06/22/2018 2248 Gross per 24 hour  Intake 809.9 ml  Output 1700 ml  Net -890.1 ml   Filed Weights   06/20/18 0418 06/21/18 0254 06/22/18 0437  Weight: 80.3 kg 79 kg 77.3 kg    Examination:  General exam: No acute distress Respiratory system: Anterior lung sounds with scattered rhonchi Cardiovascular system: Distant heart sounds, regular rate and rhythm, no murmurs Gastrointestinal system: Soft, nondistended, no rebound or guarding, plus bowel sounds Central nervous system: Awake and appears to be oriented though cannot assess completely, left-sided hemiparesis, squeezes right hand and wiggle right feet, torticollis towards right side noted Extremities: Lower  extremity edema Skin: Tracheostomy site is clean dry and intact, PEG tube site is clean dry and intact Psychiatry: Unable to assess due to medical condition.      Data Reviewed: I have personally reviewed following labs and imaging studies  CBC: Recent Labs  Lab 06/16/18 0513 06/17/18 0431 06/18/18 0500  WBC 7.6 8.7 8.9  HGB 8.5* 8.6* 8.5*  HCT 30.6* 31.4* 30.8*  MCV 95.0 96.0 95.7  PLT 285 296 834   Basic Metabolic Panel: Recent Labs  Lab 06/16/18 0513 06/17/18 0431 06/17/18 0803 06/18/18 0500  NA 137 134* 135 136  K 4.3 6.2* 5.9* 4.7  CL 100 96* 98 97*  CO2 '28 29 28 30  '$ GLUCOSE 103* 98 104* 105*  BUN 35* 38* 37* 39*  CREATININE 0.39* 0.36* 0.38* 0.33*  CALCIUM 9.3 9.3 9.3 9.4   GFR: Estimated Creatinine Clearance: 56.4 mL/min (A) (by C-G formula based on SCr of 0.33 mg/dL (L)). Liver Function Tests: No results for input(s): AST, ALT, ALKPHOS, BILITOT, PROT, ALBUMIN in the last 168 hours. No results for input(s): LIPASE, AMYLASE in the last 168 hours. No results for input(s): AMMONIA in the last 168 hours. Coagulation Profile: No results for input(s): INR, PROTIME in the last 168 hours. Cardiac Enzymes: No results for input(s): CKTOTAL, CKMB, CKMBINDEX, TROPONINI in the last 168 hours. BNP (last 3 results) No results for input(s): PROBNP in the last 8760 hours. HbA1C: No results for input(s): HGBA1C in the last 72 hours. CBG: Recent Labs  Lab 06/21/18 1540 06/21/18 2007 06/21/18 2332 06/22/18 0430 06/22/18 0724  GLUCAP 104* 98 112* 88 85   Lipid Profile: No results for input(s): CHOL, HDL, LDLCALC, TRIG, CHOLHDL, LDLDIRECT in the last 72 hours. Thyroid Function Tests: No results for input(s): TSH, T4TOTAL, FREET4, T3FREE, THYROIDAB in the last 72 hours. Anemia Panel: No results for input(s): VITAMINB12, FOLATE, FERRITIN, TIBC, IRON, RETICCTPCT in the last 72 hours. Sepsis Labs: Recent Labs  Lab 06/16/18 0513 06/17/18 0431  PROCALCITON <0.10 <0.10    Recent Results (from the past 240 hour(s))  C difficile quick scan w PCR reflex     Status: None   Collection Time: 06/12/18  6:25 PM  Result Value  Ref Range Status   C Diff antigen NEGATIVE NEGATIVE Final   C Diff toxin NEGATIVE NEGATIVE Final   C Diff interpretation No C. difficile detected.  Final    Comment: Performed at [facility], [place] [address] [place], [place] [address]  Urine Culture     Status: None   Collection Time: 06/12/18  6:25 PM  Result Value Ref Range Status   Specimen Description URINE, CATHETERIZED  Final   Special Requests NONE  Final   Culture   Final    NO GROWTH Performed at [facility], [address], [place], [place] [address]    Report Status 06/13/2018 FINAL  Final         Radiology Studies: No results found.      Scheduled Meds: . apixaban  2.5 mg Per Tube BID  . aspirin  81 mg Per Tube Daily  . carvedilol  6.25 mg Per Tube BID WC  . chlorhexidine gluconate (MEDLINE KIT)  15 mL Mouth Rinse BID  . Chlorhexidine Gluconate Cloth  6 each Topical Daily  . collagenase   Topical Daily  . diazepam  2.5 mg Per Tube Q12H  . famotidine  20 mg Per Tube QHS  . feeding supplement (PRO-STAT SUGAR FREE 64)  30 mL Per Tube BID  . free water  200 mL Per Tube Q4H  . furosemide  40 mg Per Tube Daily  . insulin aspart  0-9 Units Subcutaneous Q4H  . levETIRAcetam  1,000 mg Per Tube BID  . mouth rinse  15 mL Mouth Rinse 10 times per day  . multivitamin  15 mL Per Tube Daily  . nutrition supplement (JUVEN)  1 packet Per Tube BID BM  . sodium chloride flush  3 mL Intravenous Q12H  . valproic acid  250 mg Per Tube BID   Continuous Infusions: . sodium chloride 10 mL/hr at 06/22/18 0600  . feeding supplement (VITAL AF 1.2 CAL) 1,000 mL (06/22/18 0415)     LOS: 12 days    Time spent: [place], MD [facility]  If 7PM-7AM, please contact night-coverage [email or web address] Password [facility] 06/22/2018, 10:31 AM

## 2018-06-23 LAB — GLUCOSE, CAPILLARY
Glucose-Capillary: 87 mg/dL (ref 70–99)
Glucose-Capillary: 91 mg/dL (ref 70–99)
Glucose-Capillary: 92 mg/dL (ref 70–99)

## 2018-06-23 MED ORDER — APIXABAN 5 MG PO TABS
5.0000 mg | ORAL_TABLET | Freq: Two times a day (BID) | ORAL | Status: DC
  Administered 2018-06-23 – 2018-06-26 (×6): 5 mg
  Filled 2018-06-23 (×6): qty 1

## 2018-06-23 NOTE — Progress Notes (Signed)
[place] TEAM 1 - Stepdown/ICU TEAM  Melissa Cochran  YWV:[id number] DOB: May 03, 1936 DOA: 06/10/2018 PCP: [name], MD    Brief Narrative:  83yo F w/ a hx of chronic atrial fibrillation on apixaban, HTN, CAD s/p stent in 8546, chronic systolic CHF with EF of 27-03% 06/28/2017, nephrolithiasis complicated by hydronephrosis status post stent placement in 2017, CVA, seizure disorder, and chronic respiratory failure with ventilator and PEG dependence who presented from [facility] for fever. At baseline, on "good days" she reportedly opens her eyes, but does not otherwise communicate. In the ED she was noted to have a sacral ulcer with purulence. A CXR was unrevealing.   Subjective: Awaiting bed at [facility] for D/C. No change in clincal condition w/ no acute distress. Appears stable.   Assessment & Plan:  Sepsis of unclear source - Possible tracheobronchitis v/s PNA -Procalcitonin undetectable -C. difficile negative, respiratory virus panel negative -Blood cultures 06/10/2018 negative -Urine culture 06/12/2018 after Foley was replaced negative -Sputum culture from 06/10/2018 growing out Acinetobacter > treated with meropenem  Seizure disorder -Continue levetiracetam '1000mg'$  twice daily by tube -Continue valproic acid 250 mg twice daily by tube  Chronic respiratory failure status post tracheostomy -stable on vent   PEG tube dependence: -Continue free water flushes  -Vital 1.2 @ 65 mL/hr with 30 mL Prostat BID  Chronic atrial fibrillation complicated by CVA -Continue beta-blocker -Continue apixaban 2.5 mg twice daily  Hypertension / Hyperlipidemia / CAD status post stent -Continue aspirin 81 mg -Continue carvedilol 6.25 mg twice daily  Anemia -stable at this time -most likely iron deficiency anemia and anemia of chronic disease  Nephrolithiasis/hydronephrosis status post stents -creatinine is normal  Chronic systolic heart failure -TTE February showed EF of 40 to  45% - appears euvolemic -Continue furosemide 40 mg daily via tube  Decubitus ulcers  Stage 4 pressure injury:Sacrum: 3cm x 2.5cm x 0.5cm + Stage 3 pressure injury:Vertebral column: 5cm x 5.5cm x 1.5cm - care per [place] RN  Nutrition - PEG tube dependent Nutrition consulted  Torticollis  Goals of care Palliative Care was involved in the care of the patient during her last admit in December, at which time family insisted on ongoing full aggressive support (daugher was "okay with her mother not being able to [place], talk, walk, or even breathe on her own") - it appears to me that she has an exceedingly poor quality of life with an extremely low likelihood of making a meaningful recovery  DVT prophylaxis: Apixaban Code Status: FULL CODE Family Communication: no family present at time of exam  Disposition Plan: ICU on chronic vent - awaiting bed at [facility]   Consultants:  PCCM  Antimicrobials:  Meropenem 2/1 > 2/7 Cefepime 2/1  Vancomycin 2/1 + 2/3 > 2/5  Objective: Blood pressure 101/61, pulse 69, temperature 97.8 F (36.6 C), temperature source Oral, resp. rate 14, height '5\' 4"'$  (1.626 m), weight 77.3 kg, SpO2 97 %.  Intake/Output Summary (Last 24 hours) at 06/23/2018 0949 Last data filed at 06/23/2018 0800 Gross per 24 hour  Intake 694.94 ml  Output 1150 ml  Net -455.06 ml   Filed Weights   06/20/18 0418 06/21/18 0254 06/22/18 0437  Weight: 80.3 kg 79 kg 77.3 kg    Examination: General: No acute respiratory distress - vent via trach  Lungs: CTA th/o - no wheezing  Cardiovascular: Irregularly irregular w/o M Abdomen: soft, NT/ND- PEG insertion clean/dry  Extremities: trace edema B LE   CBC: Recent Labs  Lab 06/17/18  0431 06/18/18 0500  WBC 8.7 8.9  HGB 8.6* 8.5*  HCT 31.4* 30.8*  MCV 96.0 95.7  PLT 296 953   Basic Metabolic Panel: Recent Labs  Lab 06/17/18 0431 06/17/18 0803 06/18/18 0500  NA 134* 135 136  K 6.2* 5.9* 4.7  CL 96* 98 97*  CO2 '29 28 30    '$ GLUCOSE 98 104* 105*  BUN 38* 37* 39*  CREATININE 0.36* 0.38* 0.33*  CALCIUM 9.3 9.3 9.4   GFR: Estimated Creatinine Clearance: 56.4 mL/min (A) (by C-G formula based on SCr of 0.33 mg/dL (L)).  Liver Function Tests: No results for input(s): AST, ALT, ALKPHOS, BILITOT, PROT, ALBUMIN in the last 168 hours.  HbA1C: Hgb A1c MFr Bld  Date/Time Value Ref Range Status  02/13/2017 09:35 PM 5.1 4.8 - 5.6 % Final    Comment:    (NOTE) Pre diabetes:          5.7%-6.4% Diabetes:              >6.4% Glycemic control for   <7.0% adults with diabetes     CBG: Recent Labs  Lab 06/22/18 1551 06/22/18 1939 06/22/18 2324 06/23/18 0413 06/23/18 0750  GLUCAP 93 78 93 87 91     Scheduled Meds: . apixaban  2.5 mg Per Tube BID  . aspirin  81 mg Per Tube Daily  . carvedilol  6.25 mg Per Tube BID WC  . chlorhexidine gluconate (MEDLINE KIT)  15 mL Mouth Rinse BID  . Chlorhexidine Gluconate Cloth  6 each Topical Daily  . collagenase   Topical Daily  . diazepam  2.5 mg Per Tube Q12H  . famotidine  20 mg Per Tube QHS  . feeding supplement (PRO-STAT SUGAR FREE 64)  30 mL Per Tube BID  . free water  200 mL Per Tube Q4H  . furosemide  40 mg Per Tube Daily  . insulin aspart  0-9 Units Subcutaneous Q4H  . levETIRAcetam  1,000 mg Per Tube BID  . mouth rinse  15 mL Mouth Rinse 10 times per day  . multivitamin  15 mL Per Tube Daily  . nutrition supplement (JUVEN)  1 packet Per Tube BID BM  . sodium chloride flush  3 mL Intravenous Q12H  . valproic acid  250 mg Per Tube BID   Continuous Infusions: . sodium chloride 10 mL/hr at 06/22/18 0600  . feeding supplement (VITAL AF 1.2 CAL) 1,000 mL (06/23/18 0810)     LOS: 13 days   Cherene Altes, MD [facility] Office  [phone] Pager - Text Page per Amion  If 7PM-7AM, please contact night-coverage per Amion 06/23/2018, 9:49 AM

## 2018-06-23 NOTE — Clinical Social Work Note (Signed)
Clinical Social Work Assessment  Patient Details  Name: Melissa Cochran MRN: [id number] Date of Birth: August 24, 1935  Date of referral:  06/23/18               Reason for consult:  Facility Placement                Permission sought to share information with:  Facility Sport and exercise psychologist, Family Supports Permission granted to share information::  No  Name::     Melissa Cochran, drilling::  [facility]  Relationship::  Melissa Cochran  Contact Information:  [phone]  Housing/Transportation Living arrangements for the past 2 months:  [place] of Information:  Adult Children Patient Interpreter Needed:  None Criminal Activity/Legal Involvement Pertinent to Current Situation/Hospitalization:  No - Comment as needed Significant Relationships:  Adult Children Lives with:  Facility Resident Do you feel safe going back to the place where you live?  Yes Need for family participation in patient care:  Yes (Comment)  Care giving concerns:  CSW received consult for possible SNF placement at time of discharge. CSW spoke with patient's Melissa Cochran regarding return to [facility]. Patient's Melissa Cochran reported agreement with plan. CSW to continue to follow and assist with discharge planning needs.   Social Worker assessment / plan:  CSW spoke with patient's Melissa Cochran concerning return to [facility].  Employment status:  Retired Forensic scientist:  Medicare PT Recommendations:  [place] / Referral to community resources:  [place]  Patient/Family's Response to care:  Patient's Melissa Cochran states that she is taking the day off of work to spend the day with the patient on [birth year] on [birth year]. She expressed appreciation for the update on [facility] bed availability.   Patient/Family's Understanding of and Emotional Response to Diagnosis, Current Treatment, and Prognosis:  Patient/family is realistic regarding therapy needs and expressed being  hopeful for return to SNF placement. Patient's Melissa Cochran expressed understanding of CSW role and discharge process as well as medical condition. No questions/concerns about plan or treatment.    Emotional Assessment Appearance:  Appears stated age Attitude/Demeanor/Rapport:  Unable to Assess Affect (typically observed):  Unable to Assess Orientation:  (Intubated) Alcohol / Substance use:  Not Applicable Psych involvement (Current and /or in the community):  No (Comment)  Discharge Needs  Concerns to be addressed:  Care Coordination Readmission within the last 30 days:  No Current discharge risk:  None Barriers to Discharge:  Vent Bed not available   Benard Halsted, LCSW 06/23/2018, 3:29 PM

## 2018-06-23 NOTE — Progress Notes (Signed)
[facility] contacted CSW back. They will have a bed available for patient on Monday (no FL2 needed, just DC Summary).   Percell Locus Allysha Tryon LCSW [phone]

## 2018-06-23 NOTE — Progress Notes (Signed)
CSW left voicemail for [facility] regarding bed availability.   Percell Locus Kalie Cabral LCSW [phone]

## 2018-06-24 LAB — CBC
HCT: 35.8 % — ABNORMAL LOW (ref 36.0–46.0)
Hemoglobin: 9.6 g/dL — ABNORMAL LOW (ref 12.0–15.0)
MCH: 25.4 pg — ABNORMAL LOW (ref 26.0–34.0)
MCHC: 26.8 g/dL — ABNORMAL LOW (ref 30.0–36.0)
MCV: 94.7 fL (ref 80.0–100.0)
Platelets: 274 10*3/uL (ref 150–400)
RBC: 3.78 MIL/uL — ABNORMAL LOW (ref 3.87–5.11)
RDW: 19.1 % — ABNORMAL HIGH (ref 11.5–15.5)
WBC: 7.8 10*3/uL (ref 4.0–10.5)
nRBC: 0 % (ref 0.0–0.2)

## 2018-06-24 LAB — BASIC METABOLIC PANEL
Anion gap: 10 (ref 5–15)
BUN: 39 mg/dL — ABNORMAL HIGH (ref 8–23)
CO2: 31 mmol/L (ref 22–32)
Calcium: 9.9 mg/dL (ref 8.9–10.3)
Chloride: 98 mmol/L (ref 98–111)
Creatinine, Ser: 0.4 mg/dL — ABNORMAL LOW (ref 0.44–1.00)
GFR calc Af Amer: >60 mL/min (ref >60)
GFR calc non Af Amer: >60 mL/min (ref >60)
Glucose, Bld: 98 mg/dL (ref 70–99)
Potassium: 4.5 mmol/L (ref 3.5–5.1)
Sodium: 139 mmol/L (ref 135–145)

## 2018-06-24 LAB — GLUCOSE, CAPILLARY: Glucose-Capillary: 95 mg/dL (ref 70–99)

## 2018-06-24 NOTE — Progress Notes (Signed)
[place] TEAM 1 - Stepdown/ICU TEAM  Melissa Cochran  QQV:[id number] DOB: [birth year] DOA: 06/10/2018 PCP: [name], MD    Brief Narrative:  83yo F w/ a hx of chronic atrial fibrillation on apixaban, HTN, CAD s/p stent in 3329, chronic systolic CHF with EF of 51-88% 06/28/2017, nephrolithiasis complicated by hydronephrosis status post stent placement in 2017, CVA, seizure disorder, and chronic respiratory failure with ventilator and PEG dependence who presented from [facility] for fever. At baseline, on "good days" she reportedly opens her eyes, but does not otherwise communicate. In the ED she was noted to have a sacral ulcer with purulence. A CXR was unrevealing.   Subjective: Awaiting transfer back to [facility]. Stable clinically. No acute issues at this time.   Assessment & Plan:  Sepsis of unclear source - Possible tracheobronchitis v/s PNA -Procalcitonin undetectable -C. difficile negative, respiratory virus panel negative -Blood cultures 06/10/2018 negative -Urine culture 06/12/2018 after Foley was replaced negative -Sputum culture from 06/10/2018 growing out Acinetobacter > treated with meropenem - clinically stable   Seizure disorder -Continue levetiracetam 1059m twice daily by tube -Continue valproic acid 250 mg twice daily by tube  Chronic respiratory failure status post tracheostomy -stable on vent   PEG tube dependence: -Continue free water flushes  -Vital 1.2 @ 65 mL/hr with 30 mL Prostat BID  Chronic atrial fibrillation complicated by CVA -Continue beta-blocker -Continue apixaban   Hypertension / Hyperlipidemia / CAD status post stent -Continue aspirin 81 mg -Continue carvedilol 6.25 mg twice daily  Anemia -stable at this time -most likely iron deficiency anemia and anemia of chronic disease  Nephrolithiasis / hydronephrosis status post stents -creatinine is normal  Chronic systolic heart failure -TTE February showed EF of 40 to 45% -  appears euvolemic -Continue furosemide 40 mg daily via tube Filed Weights   06/22/18 0437 06/23/18 2200 06/24/18 0307  Weight: 77.3 kg 73.7 kg 73.7 kg     Decubitus ulcers  Stage 4 pressure injury:Sacrum: 3cm x 2.5cm x 0.5cm + Stage 3 pressure injury:Vertebral column: 5cm x 5.5cm x 1.5cm - care per WMount LeonardRN  Nutrition - PEG tube dependent Nutrition consulted  Torticollis  Goals of care Palliative Care was involved in the care of the patient during her last admit in December, at which time family insisted on ongoing full aggressive support (daugher was "okay with her mother not being able to [place] talk, walk, or even breathe on her own") - it appears to me that she has an exceedingly poor quality of life with an extremely low likelihood of making a meaningful recovery  DVT prophylaxis: Apixaban Code Status: FULL CODE Family Communication: no family present at time of exam  Disposition Plan: ICU on chronic vent - awaiting bed at [facility]   Consultants:  PCCM  Antimicrobials:  Meropenem 2/1 > 2/7 Cefepime 2/1  Vancomycin 2/1 + 2/3 > 2/5  Objective: Blood pressure 121/76, pulse 79, temperature 97.9 F (36.6 C), temperature source Axillary, resp. rate 19, height _0  (1.626 m), weight 73.7 kg, SpO2 97 %.  Intake/Output Summary (Last 24 hours) at 06/24/2018 1010 Last data filed at 06/24/2018 0800 Gross per 24 hour  Intake 1634.95 ml  Output 600 ml  Net 1034.95 ml   Filed Weights   06/22/18 0437 06/23/18 2200 06/24/18 0307  Weight: 77.3 kg 73.7 kg 73.7 kg    Examination: General: No acute respiratory distress on vent   CBC: Recent Labs  Lab 06/18/18 0500 06/24/18 0321  WBC  8.9 7.8  HGB 8.5* 9.6*  HCT 30.8* 35.8*  MCV 95.7 94.7  PLT 287 615   Basic Metabolic Panel: Recent Labs  Lab 06/18/18 0500 06/24/18 0321  NA 136 139  K 4.7 4.5  CL 97* 98  CO2 30 31  GLUCOSE 105* 98  BUN 39* 39*  CREATININE 0.33* 0.40*  CALCIUM 9.4 9.9   GFR: Estimated  Creatinine Clearance: 54.2 mL/min (A) (by C-G formula based on SCr of 0.4 mg/dL (L)).  Liver Function Tests: No results for input(s): AST, ALT, ALKPHOS, BILITOT, PROT, ALBUMIN in the last 168 hours.  HbA1C: Hgb A1c MFr Bld  Date/Time Value Ref Range Status  02/13/2017 09:35 PM 5.1 4.8 - 5.6 % Final    Comment:    (NOTE) Pre diabetes:          5.7%-6.4% Diabetes:              >6.4% Glycemic control for   <7.0% adults with diabetes     CBG: Recent Labs  Lab 06/22/18 1939 06/22/18 2324 06/23/18 0413 06/23/18 0750 06/23/18 1143  GLUCAP 78 93 87 91 92     Scheduled Meds: . apixaban  5 mg Per Tube BID  . aspirin  81 mg Per Tube Daily  . carvedilol  6.25 mg Per Tube BID WC  . chlorhexidine gluconate (MEDLINE KIT)  15 mL Mouth Rinse BID  . Chlorhexidine Gluconate Cloth  6 each Topical Daily  . collagenase   Topical Daily  . diazepam  2.5 mg Per Tube Q12H  . famotidine  20 mg Per Tube QHS  . feeding supplement (PRO-STAT SUGAR FREE 64)  30 mL Per Tube BID  . free water  200 mL Per Tube Q4H  . furosemide  40 mg Per Tube Daily  . levETIRAcetam  1,000 mg Per Tube BID  . mouth rinse  15 mL Mouth Rinse 10 times per day  . multivitamin  15 mL Per Tube Daily  . nutrition supplement (JUVEN)  1 packet Per Tube BID BM  . sodium chloride flush  3 mL Intravenous Q12H  . valproic acid  250 mg Per Tube BID   Continuous Infusions: . sodium chloride 10 mL/hr at 06/23/18 2200  . feeding supplement (VITAL AF 1.2 CAL) 1,000 mL (06/23/18 0810)     LOS: 14 days   Cherene Altes, MD [facility] Office  [phone] Pager - Text Page per Amion  If 7PM-7AM, please contact night-coverage per Amion 06/24/2018, 10:10 AM

## 2018-06-25 NOTE — Progress Notes (Signed)
[facility] TEAM 1 - Stepdown/ICU TEAM  Melissa Cochran  BMW:[id number] DOB: 1935-08-24 DOA: 06/10/2018 PCP: [name], MD    Brief Narrative:  83yo F w/ a hx of chronic atrial fibrillation on apixaban, HTN, CAD s/p stent in 2725, chronic systolic CHF with EF 36-64% 06/28/2017, nephrolithiasis complicated by hydronephrosis status post stent placement in 2017, CVA, seizure disorder, and chronic respiratory failure with vent / PEG dependence who presented from [facility] for fever. At baseline on "good days" she reportedly opens her eyes, but does not otherwise communicate. In the ED she was noted to have a sacral ulcer with purulence. A CXR was unrevealing.   Subjective: Awaiting transfer back to [facility]. Remains stable clinically. No acute issues at this time.   Assessment & Plan:  Sepsis of unclear source - Possible tracheobronchitis v/s PNA -Sputum culture from 06/10/2018 grew Acinetobacter > treated with meropenem - clinically stable -Procalcitonin undetectable -C. difficile negative, respiratory virus panel negative -Blood cultures 06/10/2018 negative -Urine culture 06/12/2018 after Foley was replaced negative   Seizure disorder -Continue levetiracetam 1071m twice daily by tube -Continue valproic acid 250 mg twice daily by tube  Chronic respiratory failure status post tracheostomy -stable on vent   PEG tube dependence -Continue free water flushes  -Vital 1.2 @ 65 mL/hr with 30 mL Prostat BID  Chronic atrial fibrillation complicated by CVA -Continue beta-blocker -Continue apixaban   CAD status post stent -Continue aspirin 81 mg  Hypertension -Continue carvedilol 6.25 mg twice daily  Hyperlipidemia  Anemia -stable at this time -most likely iron deficiency anemia and anemia of chronic disease  Nephrolithiasis / hydronephrosis status post stents -creatinine is normal  Chronic systolic heart failure -TTE February showed EF of 40 to 45% - appears  euvolemic -Continue furosemide 40 mg daily via [facility]   06/23/18 2200 06/24/18 0307 06/25/18 0242  Weight: 73.7 kg 73.7 kg 75.5 kg    Decubitus ulcers  Stage 4 pressure injury:Sacrum: 3cm x 2.5cm x 0.5cm + Stage 3 pressure injury:Vertebral column: 5cm x 5.5cm x 1.5cm - care per [place]  Nutrition - PEG tube dependent Nutrition consulted  Torticollis  Goals of care Palliative Care was involved in the care of the patient during her last admit in December, at which time family insisted on ongoing full aggressive support (daugher was "okay with her mother not being able to [place] talk, walk, or even breathe on her own") - it appears to me that she has an exceedingly poor quality of life with an extremely low likelihood of making a meaningful recovery  DVT prophylaxis: Apixaban Code Status: FULL CODE Family Communication: no family present at time of exam  Disposition Plan: ICU on chronic vent - [facility]  Consultants:  PCCM  Antimicrobials:  Meropenem 2/1 > 2/7 Cefepime 2/1  Vancomycin 2/1 + 2/3 > 2/5  Objective: Blood pressure 114/68, pulse 65, temperature 97.6 F (36.4 C), temperature source Oral, resp. rate (!) 24, height _0  (1.626 m), weight 75.5 kg, SpO2 97 %.  Intake/Output Summary (Last 24 hours) at 06/25/2018 0919 Last data filed at 06/25/2018 0800 Gross per 24 hour  Intake 1774.95 ml  Output 1955 ml  Net -180.05 ml   Filed Weights   06/23/18 2200 06/24/18 0307 06/25/18 0242  Weight: 73.7 kg 73.7 kg 75.5 kg    Examination: General: No acute respiratory distress on vent   CBC: Recent Labs  Lab 06/24/18 0321  WBC 7.8  HGB 9.6*  HCT 35.8*  MCV 94.7  PLT 125   Basic Metabolic Panel: Recent Labs  Lab 06/24/18 0321  NA 139  K 4.5  CL 98  CO2 31  GLUCOSE 98  BUN 39*  CREATININE 0.40*  CALCIUM 9.9   GFR: Estimated Creatinine Clearance: 54.9 mL/min (A) (by C-G formula based on SCr of 0.4 mg/dL (L)).  Liver  Function Tests: No results for input(s): AST, ALT, ALKPHOS, BILITOT, PROT, ALBUMIN in the last 168 hours.  HbA1C: Hgb A1c MFr Bld  Date/Time Value Ref Range Status  02/13/2017 09:35 PM 5.1 4.8 - 5.6 % Final    Comment:    (NOTE) Pre diabetes:          5.7%-6.4% Diabetes:              >6.4% Glycemic control for   <7.0% adults with diabetes     CBG: Recent Labs  Lab 06/22/18 2324 06/23/18 0413 06/23/18 0750 06/23/18 1143 06/24/18 1949  GLUCAP 93 87 91 92 95     Scheduled Meds: . apixaban  5 mg Per Tube BID  . aspirin  81 mg Per Tube Daily  . carvedilol  6.25 mg Per Tube BID WC  . chlorhexidine gluconate (MEDLINE KIT)  15 mL Mouth Rinse BID  . Chlorhexidine Gluconate Cloth  6 each Topical Daily  . diazepam  2.5 mg Per Tube Q12H  . famotidine  20 mg Per Tube QHS  . feeding supplement (PRO-STAT SUGAR FREE 64)  30 mL Per Tube BID  . free water  200 mL Per Tube Q4H  . furosemide  40 mg Per Tube Daily  . levETIRAcetam  1,000 mg Per Tube BID  . mouth rinse  15 mL Mouth Rinse 10 times per day  . multivitamin  15 mL Per Tube Daily  . nutrition supplement (JUVEN)  1 packet Per Tube BID BM  . sodium chloride flush  3 mL Intravenous Q12H  . valproic acid  250 mg Per Tube BID   Continuous Infusions: . sodium chloride 10 mL/hr at 06/25/18 0608  . feeding supplement (VITAL AF 1.2 CAL) 1,000 mL (06/25/18 0555)     LOS: 15 days   Cherene Altes, MD [facility] Office  [phone] Pager - Text Page per Amion  If 7PM-7AM, please contact night-coverage per Amion 06/25/2018, 9:19 AM

## 2018-06-26 MED ORDER — PRO-STAT SUGAR FREE PO LIQD: 30.0000 mL | Freq: Two times a day (BID) | ORAL | 0 refills | Status: DC

## 2018-06-26 MED ORDER — APIXABAN 5 MG PO TABS: 5.0000 mg | ORAL_TABLET | Freq: Two times a day (BID) | ORAL | Status: DC

## 2018-06-26 MED ORDER — VITAL AF 1.2 CAL PO LIQD: 1000.0000 mL | ORAL | Status: DC

## 2018-06-26 NOTE — Progress Notes (Signed)
.  5 TABLET OF VALIUM WASTED IN SHARPS WITNESSED BY Heide Guile, RN

## 2018-06-26 NOTE — Clinical Social Work Note (Addendum)
Clinical Social Worker facilitated patient discharge including contacting patient family and facility to confirm patient discharge plans.  Clinical information faxed to facility and family agreeable with plan. Pt will be transported via [facility]. [facility] is [facility], accepting provider is Bevelyn Ngo and patient will go to room 304. RN will need to complete MTALA form and get doctor to sign off on it. RN to call [facility] and set up transport for 1 once document completed and signed. RN to call [phone] for report prior to discharge.  Clinical Social Worker will sign off for now as social work intervention is no longer needed. Please consult [place] again if new need arises.  [place], [place]

## 2018-06-26 NOTE — Discharge Summary (Signed)
DISCHARGE SUMMARY  ANALESE Melissa Cochran  MR#: [id number]  DOB:August 05, 1935  Date of Admission: 06/10/2018 Date of Discharge: 06/26/2018  Attending Physician:Soleil Mas Hennie Duos, MD  Patient's GUR:KYHCW, Melissa Finn, MD  Consults:  PCCM  Disposition: D/C to [facility]   Follow-up Appts: Follow-up Information    [name], MD Follow up.   Specialty:  Family Medicine Contact information: [address] [place] [address] [phone]           Discharge Diagnoses: Sepsis of unclear source Tracheobronchitis v/s PNA Seizure disorder Chronic respiratory failure status post tracheostomy PEG tube dependence Chronic atrial fibrillation complicated by CVA CAD status post stent Hypertension Hyperlipidemia Anemia Nephrolithiasis / hydronephrosis status post stents Chronic systolic heart failure Decubitus ulcers  Nutrition - PEG tube dependent Torticollis  Initial presentation: 83yo F w/ a hx of chronic atrial fibrillation on apixaban, HTN, CAD s/p stent in 7371, chronic systolic CHF with EF 06-26% 06/28/2017, nephrolithiasis complicated by hydronephrosis status post stent placement in 2017, CVA, seizure disorder, and chronic respiratory failure with vent / PEG dependence who presentedfrom [facility] for fever.At baseline on "good days" she reportedly opens her eyes, but does not otherwise communicate. In the ED she was noted to have a sacral ulcer with purulence. A CXR was unrevealing.   Hospital Course:  Sepsis of unclear source - Possible tracheobronchitis v/s PNA -Sputum culture from 06/10/2018 grew MDR Acinetobacter>treated with meropenem - clinically stable at time of d/c w/o clinical signs of active infxn -Procalcitonin undetectable -C. difficile negative, respiratory virus panel negative -Blood cultures 06/10/2018 negative -Urine culture 06/12/2018 after Foley was replaced negative   Seizure disorder -Continue levetiracetam1000mg twice daily by  tube -Continue valproic acid 250 mg twice daily by tube -no evidence of seizure activity during this hospital stay   Chronic respiratory failure status post tracheostomy -stable on vent- vent/trach care per PCCM   PEG tube dependence -Continue free water flushes  -Vital 1.2 @ 65 mL/hr with 30 mL Prostat BID  Chronic atrial fibrillation complicated by CVA -Continue beta-blocker -Continue apixaban  -rate controlled in afib at time of d/c   CAD status post stent -Continue aspirin 81 mg  Hypertension -Continue carvedilol 6.25 mg twice daily - BP controlled   Hyperlipidemia  Anemia -stable at this time -most c/w iron deficiency anemia and anemia of chronic disease  Nephrolithiasis / hydronephrosis status post stents -creatinine is normal  Chronic systolic heart failure -TTEFebruary showed EF of 40 to 45% - appears euvolemic at time of d/c  -Continue furosemide 40 mg daily via tube  Decubitus ulcers  Stage 4 pressure injury:Sacrum:3cm x 2.5cm x 0.5cm + Stage 3 pressure injury:Vertebral column:5cm x 5.5cm x 1.5cm- care per [place] RN as follows: Add hydrotherapy to the thoracic wound to clean up a bit. (completed during admit)  Add Sanytl to this wound as well after therapy Progressa low air loss mattress in place while in the ICU. Will need air mattress upon DC from the ICU Silver hydrofiber packing to the sacral wound to manage exudate, address bioburdan  Nutrition - PEG tube dependent Nutrition consulted  Torticollis  Goals of care Palliative Care was involved in the care of the patient during her last admit in December 2019, at which time family insisted on ongoing full aggressive support (daugher was "okay with her mother not being able to [place], talk, walk, or even breathe on her own") - it appears to me that she has an exceedingly poor quality of life with an extremely  low likelihood of making a meaningful recovery  Allergies as of 06/26/2018   No  Known Allergies     Medication List    STOP taking these medications   acetaminophen 325 MG tablet Commonly known as:  TYLENOL Replaced by:  acetaminophen 160 MG/5ML solution     TAKE these medications   acetaminophen 160 MG/5ML solution Commonly known as:  TYLENOL Place 20.3 mLs (650 mg total) into feeding tube every 6 (six) hours as needed for mild pain, headache or fever. Replaces:  acetaminophen 325 MG tablet   apixaban 5 MG Tabs tablet Commonly known as:  ELIQUIS Place 1 tablet (5 mg total) into feeding tube 2 (two) times daily. What changed:    medication strength  how much to take   aspirin 81 MG chewable tablet Place 1 tablet (81 mg total) into feeding tube daily. What changed:  how to take this   carvedilol 6.25 MG tablet Commonly known as:  COREG Place 1 tablet (6.25 mg total) into feeding tube 2 (two) times daily with a meal. What changed:  how to take this   chlorhexidine 0.12 % solution Commonly known as:  PERIDEX Use as directed 15 mLs in the mouth or throat 2 (two) times daily.   collagenase ointment Commonly known as:  SANTYL Apply topically daily.   diazepam 5 MG tablet Commonly known as:  VALIUM Place 0.5 tablets (2.5 mg total) into feeding tube every 12 (twelve) hours. What changed:  how to take this   famotidine 20 MG tablet Commonly known as:  PEPCID Place 20 mg into feeding tube daily.   feeding supplement (PRO-STAT SUGAR FREE 64) Liqd Place 30 mLs into feeding tube 2 (two) times daily.   free water Soln Place 200 mLs into feeding tube every 4 (four) hours.   furosemide 40 MG tablet Commonly known as:  LASIX Place 1 tablet (40 mg total) into feeding tube daily. What changed:  how to take this   HYDROcodone-acetaminophen 5-325 MG tablet Commonly known as:  NORCO/VICODIN Place 1 tablet into feeding tube every 8 (eight) hours as needed for moderate pain.   ipratropium-albuterol 0.5-2.5 (3) MG/3ML Soln Commonly known as:   DUONEB Take 3 mLs by nebulization every 6 (six) hours as needed (SOB).   levETIRAcetam 100 MG/ML solution Commonly known as:  KEPPRA Place 10 mLs (1,000 mg total) into feeding tube 2 (two) times daily.   multivitamin Liqd Place 15 mLs into feeding tube daily.   nutrition supplement (JUVEN) Pack Place 1 packet into feeding tube 2 (two) times daily between meals. What changed:  Another medication with the same name was added. Make sure you understand how and when to take each.   feeding supplement (VITAL AF 1.2 CAL) Liqd Place 1,000 mLs into feeding tube continuous. What changed:  You were already taking a medication with the same name, and this prescription was added. Make sure you understand how and when to take each.   Valproate Sodium 250 MG/5ML Soln solution Commonly known as:  DEPAKENE Place 5 mLs (250 mg total) into feeding tube 2 (two) times daily. What changed:    how much to take  how to take this       Day of Discharge BP (!) 143/101   Pulse 92   Temp 98.8 F (37.1 C) (Oral)   Resp 17   Ht 5\' 4"  (1.626 m)   Wt 75.5 kg   SpO2 100%   BMI 28.57 kg/m   Physical Exam: General:  No acute respiratory distress Lungs: Clear to auscultation bilaterally without wheezes or crackles - poor air movement B bases  Cardiovascular: Regular rate without murmur gallop or rub Abdomen: Nondistended, soft, bowel sounds positive, no rebound, PEG insertion clean and dry  Extremities: No significant cyanosis, clubbing, or edema bilateral lower extremities  Basic Metabolic Panel: Recent Labs  Lab 06/24/18 0321  NA 139  K 4.5  CL 98  CO2 31  GLUCOSE 98  BUN 39*  CREATININE 0.40*  CALCIUM 9.9   CBC: Recent Labs  Lab 06/24/18 0321  WBC 7.8  HGB 9.6*  HCT 35.8*  MCV 94.7  PLT 274    CBG: Recent Labs  Lab 06/22/18 2324 06/23/18 0413 06/23/18 0750 06/23/18 1143 06/24/18 1949  GLUCAP 93 87 91 92 95    Time spent in discharge (includes decision making &  examination of pt): 35 minutes  06/26/2018, 9:25 AM   Cherene Altes, MD [facility] Office  [phone] Pager [phone]  On-Call/Text Page:      [email or web address]      password [facility]

## 2018-06-26 NOTE — Progress Notes (Signed)
   NAME:  ALOHA Cochran, MRN:  [id number], DOB:  Jan 06, 1936, LOS: 32 ADMISSION DATE:  06/10/2018, CONSULTATION DATE: 06/10/2018 REFERRING MD: Dr. Ronnald Nian, CHIEF COMPLAINT: Fever  Brief History   Patient is unresponsive, chronic trach from [facility].  All history was obtained from the patient's medical record.   83 year old female history of stroke status post tracheostomy, PEG tube, chronically bedbound patient of [facility].  Patient with chronic indwelling Foley catheter presented to the hospital tachycardic and febrile.  EKG initially revealed H fibrillation with RVR she has a known sacral wound.  Patient was placed on IV antibiotics vancomycin and cefepime, transitioned to meropenem due to sputum cx 2/1 of acinetobactor.  Pulmonary was consulted for recommendations regarding chronic tracheostomy tube.  And whether or not the patient would need to be in the intensive care unit. uation.   Past Medical History  Adnexal mass, anxiety, bladder tumor, CVA, CHF, bedbound, COPD, vent dependent, GERD,    [facility] admission 05/11/2018  Consults:  Critical care  Procedures:  None  Significant Diagnostic Tests:   Micro Data:  04/21/2018 sputum culture: Pseudomonas aeruginosa 04/20/2018: MRSA PCR positive  06/10/2018: Blood cultures no growth to date 06/10/2018: Urine culture negative 06/10/2018: few acinetobacter   Antimicrobials:  Cefepime 2/1 Vancomycin 2/1 > 2/6 Meropenem - completed 2/1- 2/7  Interim history/subjective:  No issues.  Awaiting transfer to back to [facility]   Objective   Blood pressure (!) 143/101, pulse 92, temperature 98.8 F (37.1 C), temperature source Oral, resp. rate 17, height 5\' 4"  (1.626 m), weight 75.5 kg, SpO2 100 %.    Vent Mode: PRVC FiO2 (%):  [30 %] 30 % Set Rate:  [12 bmp] 12 bmp Vt Set:  [500 mL] 500 mL PEEP:  [5 cmH20] 5 cmH20 Plateau Pressure:  [17 cmH20-22 cmH20] 22 cmH20   Intake/Output Summary (Last 24  hours) at 06/26/2018 7262 Last data filed at 06/26/2018 0000 Gross per 24 hour  Intake 769.99 ml  Output 1750 ml  Net -980.01 ml   Filed Weights   06/23/18 2200 06/24/18 0307 06/25/18 0242  Weight: 73.7 kg 73.7 kg 75.5 kg    Examination: General:  Chronically ill appearing elderly female on full MV support, NAD HEENT: MM pink/moist, trach midline/ clean Neuro: opens eyes, contracted in extremities CV:  rrr, no murmur PULM: even/non-labored, lungs bilaterally clear MB:TDHR, bs active, flexiseal, chronic foley  Extremities: warm/dry, diffuse atrophy, contracted  Skin: no rashes   Resolved Hospital Problem list   Severe sepsis syndrome (completed course of meropenem)  Assessment & Plan:   Chronic hypoxemic respiratory failure requiring tracheostomy tube and mechanical ventilatory support History of atrial fibrillation H/o cva History of chronic systolic heart failure-Prior echo in 2019 with EF 45 to 50% Elevated pulmonary pressures consistent with moderate pulmonary hypertension, peak PA pressures 56 mmHg Dysphagia  Sacral wound History of seizures Nephrolithiasis w/ hydronephrosis s/p stents Decub ulcers Torticollis   Discussion Awaiting [facility] to [facility]. Hemodynamically stable. Not weanable.    Plan/rec Cont current vent setting PRVC 500, rate 12, PEEP 5, 30% Routine trach care  Await transfer to [place], MSN, AGACNP-BC [facility] Pgr: [phone] or if no answer [phone] 06/26/2018, 9:36 AM

## 2018-06-26 NOTE — Clinical Social Work Placement (Signed)
   CLINICAL SOCIAL WORK PLACEMENT  NOTE  Date:  06/26/2018  Patient Details  Name: Melissa Cochran MRN: [id number] Date of Birth: May 02, 1936  Clinical Social Work is seeking post-discharge placement for this patient at the [place] level of care (*CSW will initial, date and re-position this form in  chart as items are completed):      Patient/family provided with [facility] Work Department's list of facilities offering this level of care within the geographic area requested by the patient (or if unable, by the patient's family).  Yes   Patient/family informed of their freedom to choose among providers that offer the needed level of care, that participate in Medicare, Medicaid or managed care program needed by the patient, have an available bed and are willing to accept the patient.      Patient/family informed of [facility]'s ownership interest in [facility] and [facility], as well as of the fact that they are under no obligation to receive care at these facilities.  PASRR submitted to EDS on       PASRR number received on       Existing PASRR number confirmed on       FL2 transmitted to all facilities in geographic area requested by pt/family on       FL2 transmitted to all facilities within larger geographic area on       Patient informed that his/her managed care company has contracts with or will negotiate with certain facilities, including the following:            Patient/family informed of bed offers received.  Patient chooses bed at [facility]     Physician recommends and patient chooses bed at      Patient to be transferred to [facility] on 06/26/18.  Patient to be transferred to facility by [facility]     Patient family notified on 06/26/18 of transfer.  Name of family member notified:  Rhonda     PHYSICIAN       Additional Comment:     _______________________________________________ Eileen Stanford, LCSW 06/26/2018, 9:38 AM

## 2018-06-27 DIAGNOSIS — M6249 Contracture of muscle, multiple sites: Secondary | ICD-10-CM | POA: Diagnosis not present

## 2018-06-28 DIAGNOSIS — M6249 Contracture of muscle, multiple sites: Secondary | ICD-10-CM | POA: Diagnosis not present

## 2018-06-29 DIAGNOSIS — M6249 Contracture of muscle, multiple sites: Secondary | ICD-10-CM | POA: Diagnosis not present

## 2018-06-30 DIAGNOSIS — M6249 Contracture of muscle, multiple sites: Secondary | ICD-10-CM | POA: Diagnosis not present

## 2018-06-30 DIAGNOSIS — Z9911 Dependence on respirator [ventilator] status: Secondary | ICD-10-CM | POA: Diagnosis not present

## 2018-06-30 DIAGNOSIS — J9600 Acute respiratory failure, unspecified whether with hypoxia or hypercapnia: Secondary | ICD-10-CM | POA: Diagnosis not present

## 2018-07-03 DIAGNOSIS — M6249 Contracture of muscle, multiple sites: Secondary | ICD-10-CM | POA: Diagnosis not present

## 2018-07-03 DIAGNOSIS — J9600 Acute respiratory failure, unspecified whether with hypoxia or hypercapnia: Secondary | ICD-10-CM | POA: Diagnosis not present

## 2018-07-04 DIAGNOSIS — R1310 Dysphagia, unspecified: Secondary | ICD-10-CM | POA: Diagnosis not present

## 2018-07-04 DIAGNOSIS — J9621 Acute and chronic respiratory failure with hypoxia: Secondary | ICD-10-CM | POA: Diagnosis not present

## 2018-07-04 DIAGNOSIS — I4891 Unspecified atrial fibrillation: Secondary | ICD-10-CM | POA: Diagnosis not present

## 2018-07-04 DIAGNOSIS — J189 Pneumonia, unspecified organism: Secondary | ICD-10-CM | POA: Diagnosis not present

## 2018-07-04 DIAGNOSIS — M6249 Contracture of muscle, multiple sites: Secondary | ICD-10-CM | POA: Diagnosis not present

## 2018-07-05 DIAGNOSIS — M6249 Contracture of muscle, multiple sites: Secondary | ICD-10-CM | POA: Diagnosis not present

## 2018-07-07 DIAGNOSIS — M6249 Contracture of muscle, multiple sites: Secondary | ICD-10-CM | POA: Diagnosis not present

## 2018-07-08 DIAGNOSIS — M6249 Contracture of muscle, multiple sites: Secondary | ICD-10-CM | POA: Diagnosis not present

## 2018-07-10 DIAGNOSIS — M6249 Contracture of muscle, multiple sites: Secondary | ICD-10-CM | POA: Diagnosis not present

## 2018-07-11 DIAGNOSIS — M6249 Contracture of muscle, multiple sites: Secondary | ICD-10-CM | POA: Diagnosis not present

## 2018-07-11 DIAGNOSIS — J9600 Acute respiratory failure, unspecified whether with hypoxia or hypercapnia: Secondary | ICD-10-CM | POA: Diagnosis not present

## 2018-07-11 DIAGNOSIS — Z9911 Dependence on respirator [ventilator] status: Secondary | ICD-10-CM | POA: Diagnosis not present

## 2018-07-12 DIAGNOSIS — M6249 Contracture of muscle, multiple sites: Secondary | ICD-10-CM | POA: Diagnosis not present

## 2018-07-13 DIAGNOSIS — M6249 Contracture of muscle, multiple sites: Secondary | ICD-10-CM | POA: Diagnosis not present

## 2018-07-14 DIAGNOSIS — M6249 Contracture of muscle, multiple sites: Secondary | ICD-10-CM | POA: Diagnosis not present

## 2018-07-17 DIAGNOSIS — M6249 Contracture of muscle, multiple sites: Secondary | ICD-10-CM | POA: Diagnosis not present

## 2018-07-18 DIAGNOSIS — M6249 Contracture of muscle, multiple sites: Secondary | ICD-10-CM | POA: Diagnosis not present

## 2018-07-19 DIAGNOSIS — M6249 Contracture of muscle, multiple sites: Secondary | ICD-10-CM | POA: Diagnosis not present

## 2018-07-20 DIAGNOSIS — M6249 Contracture of muscle, multiple sites: Secondary | ICD-10-CM | POA: Diagnosis not present

## 2018-07-21 DIAGNOSIS — Z9911 Dependence on respirator [ventilator] status: Secondary | ICD-10-CM | POA: Diagnosis not present

## 2018-07-21 DIAGNOSIS — M6249 Contracture of muscle, multiple sites: Secondary | ICD-10-CM | POA: Diagnosis not present

## 2018-07-21 DIAGNOSIS — J9600 Acute respiratory failure, unspecified whether with hypoxia or hypercapnia: Secondary | ICD-10-CM | POA: Diagnosis not present

## 2018-07-24 DIAGNOSIS — J9600 Acute respiratory failure, unspecified whether with hypoxia or hypercapnia: Secondary | ICD-10-CM | POA: Diagnosis not present

## 2018-07-24 DIAGNOSIS — Z9911 Dependence on respirator [ventilator] status: Secondary | ICD-10-CM | POA: Diagnosis not present

## 2018-07-24 DIAGNOSIS — M6249 Contracture of muscle, multiple sites: Secondary | ICD-10-CM | POA: Diagnosis not present

## 2018-07-25 DIAGNOSIS — M6249 Contracture of muscle, multiple sites: Secondary | ICD-10-CM | POA: Diagnosis not present

## 2018-07-26 DIAGNOSIS — M6249 Contracture of muscle, multiple sites: Secondary | ICD-10-CM | POA: Diagnosis not present

## 2018-07-27 DIAGNOSIS — M6249 Contracture of muscle, multiple sites: Secondary | ICD-10-CM | POA: Diagnosis not present

## 2018-07-28 DIAGNOSIS — R2689 Other abnormalities of gait and mobility: Secondary | ICD-10-CM | POA: Diagnosis not present

## 2018-07-28 DIAGNOSIS — Z9911 Dependence on respirator [ventilator] status: Secondary | ICD-10-CM | POA: Diagnosis not present

## 2018-07-28 DIAGNOSIS — M6249 Contracture of muscle, multiple sites: Secondary | ICD-10-CM | POA: Diagnosis not present

## 2018-07-28 DIAGNOSIS — R5381 Other malaise: Secondary | ICD-10-CM | POA: Diagnosis not present

## 2018-07-28 DIAGNOSIS — J9600 Acute respiratory failure, unspecified whether with hypoxia or hypercapnia: Secondary | ICD-10-CM | POA: Diagnosis not present

## 2018-07-28 DIAGNOSIS — M6281 Muscle weakness (generalized): Secondary | ICD-10-CM | POA: Diagnosis not present

## 2018-07-28 DIAGNOSIS — R4189 Other symptoms and signs involving cognitive functions and awareness: Secondary | ICD-10-CM | POA: Diagnosis not present

## 2018-07-31 DIAGNOSIS — M6249 Contracture of muscle, multiple sites: Secondary | ICD-10-CM | POA: Diagnosis not present

## 2018-07-31 DIAGNOSIS — R5381 Other malaise: Secondary | ICD-10-CM | POA: Diagnosis not present

## 2018-07-31 DIAGNOSIS — R4189 Other symptoms and signs involving cognitive functions and awareness: Secondary | ICD-10-CM | POA: Diagnosis not present

## 2018-07-31 DIAGNOSIS — R2689 Other abnormalities of gait and mobility: Secondary | ICD-10-CM | POA: Diagnosis not present

## 2018-07-31 DIAGNOSIS — M6281 Muscle weakness (generalized): Secondary | ICD-10-CM | POA: Diagnosis not present

## 2018-08-01 DIAGNOSIS — M6249 Contracture of muscle, multiple sites: Secondary | ICD-10-CM | POA: Diagnosis not present

## 2018-08-02 DIAGNOSIS — M6249 Contracture of muscle, multiple sites: Secondary | ICD-10-CM | POA: Diagnosis not present

## 2018-08-02 DIAGNOSIS — J9600 Acute respiratory failure, unspecified whether with hypoxia or hypercapnia: Secondary | ICD-10-CM | POA: Diagnosis not present

## 2018-08-03 DIAGNOSIS — M6249 Contracture of muscle, multiple sites: Secondary | ICD-10-CM | POA: Diagnosis not present

## 2018-08-04 DIAGNOSIS — M6249 Contracture of muscle, multiple sites: Secondary | ICD-10-CM | POA: Diagnosis not present

## 2018-08-07 DIAGNOSIS — J181 Lobar pneumonia, unspecified organism: Secondary | ICD-10-CM | POA: Diagnosis not present

## 2018-08-07 DIAGNOSIS — M6249 Contracture of muscle, multiple sites: Secondary | ICD-10-CM | POA: Diagnosis not present

## 2018-08-08 DIAGNOSIS — J9600 Acute respiratory failure, unspecified whether with hypoxia or hypercapnia: Secondary | ICD-10-CM | POA: Diagnosis not present

## 2018-08-08 DIAGNOSIS — M6249 Contracture of muscle, multiple sites: Secondary | ICD-10-CM | POA: Diagnosis not present

## 2018-08-09 DIAGNOSIS — M6249 Contracture of muscle, multiple sites: Secondary | ICD-10-CM | POA: Diagnosis not present

## 2018-08-10 DIAGNOSIS — M6249 Contracture of muscle, multiple sites: Secondary | ICD-10-CM | POA: Diagnosis not present

## 2018-08-10 DIAGNOSIS — J9600 Acute respiratory failure, unspecified whether with hypoxia or hypercapnia: Secondary | ICD-10-CM | POA: Diagnosis not present

## 2018-08-10 DIAGNOSIS — J90 Pleural effusion, not elsewhere classified: Secondary | ICD-10-CM | POA: Diagnosis not present

## 2018-08-11 DIAGNOSIS — M6249 Contracture of muscle, multiple sites: Secondary | ICD-10-CM | POA: Diagnosis not present

## 2018-08-11 DIAGNOSIS — M6281 Muscle weakness (generalized): Secondary | ICD-10-CM | POA: Diagnosis not present

## 2018-08-11 DIAGNOSIS — R5381 Other malaise: Secondary | ICD-10-CM | POA: Diagnosis not present

## 2018-08-11 DIAGNOSIS — R4189 Other symptoms and signs involving cognitive functions and awareness: Secondary | ICD-10-CM | POA: Diagnosis not present

## 2018-08-11 DIAGNOSIS — R2689 Other abnormalities of gait and mobility: Secondary | ICD-10-CM | POA: Diagnosis not present

## 2018-08-12 ENCOUNTER — Emergency Department (HOSPITAL_COMMUNITY): Payer: Medicare Other

## 2018-08-12 ENCOUNTER — Other Ambulatory Visit: Payer: Self-pay

## 2018-08-12 ENCOUNTER — Inpatient Hospital Stay (HOSPITAL_COMMUNITY)
Admission: EM | Admit: 2018-08-12 | Discharge: 2018-08-25 | DRG: 870 | Disposition: A | Discharge status: Skilled Nursing Facility | Payer: Medicare Other | Source: Skilled Nursing Facility | Attending: Family Medicine | Admitting: Family Medicine

## 2018-08-12 DIAGNOSIS — Z20828 Contact with and (suspected) exposure to other viral communicable diseases: Secondary | ICD-10-CM | POA: Diagnosis present

## 2018-08-12 DIAGNOSIS — L89526 Pressure-induced deep tissue damage of left ankle: Secondary | ICD-10-CM | POA: Diagnosis present

## 2018-08-12 DIAGNOSIS — R6520 Severe sepsis without septic shock: Secondary | ICD-10-CM | POA: Diagnosis present

## 2018-08-12 DIAGNOSIS — A419 Sepsis, unspecified organism: Secondary | ICD-10-CM | POA: Diagnosis not present

## 2018-08-12 DIAGNOSIS — J9690 Respiratory failure, unspecified, unspecified whether with hypoxia or hypercapnia: Secondary | ICD-10-CM

## 2018-08-12 DIAGNOSIS — F0390 Unspecified dementia without behavioral disturbance: Secondary | ICD-10-CM | POA: Diagnosis present

## 2018-08-12 DIAGNOSIS — Z930 Tracheostomy status: Secondary | ICD-10-CM | POA: Diagnosis not present

## 2018-08-12 DIAGNOSIS — N179 Acute kidney failure, unspecified: Secondary | ICD-10-CM | POA: Diagnosis present

## 2018-08-12 DIAGNOSIS — J9600 Acute respiratory failure, unspecified whether with hypoxia or hypercapnia: Secondary | ICD-10-CM | POA: Diagnosis not present

## 2018-08-12 DIAGNOSIS — J181 Lobar pneumonia, unspecified organism: Secondary | ICD-10-CM | POA: Diagnosis not present

## 2018-08-12 DIAGNOSIS — Z452 Encounter for adjustment and management of vascular access device: Secondary | ICD-10-CM | POA: Diagnosis not present

## 2018-08-12 DIAGNOSIS — J156 Pneumonia due to other aerobic Gram-negative bacteria: Secondary | ICD-10-CM | POA: Diagnosis not present

## 2018-08-12 DIAGNOSIS — I482 Chronic atrial fibrillation, unspecified: Secondary | ICD-10-CM | POA: Diagnosis present

## 2018-08-12 DIAGNOSIS — Z9911 Dependence on respirator [ventilator] status: Secondary | ICD-10-CM

## 2018-08-12 DIAGNOSIS — L8990 Pressure ulcer of unspecified site, unspecified stage: Secondary | ICD-10-CM | POA: Diagnosis present

## 2018-08-12 DIAGNOSIS — R918 Other nonspecific abnormal finding of lung field: Secondary | ICD-10-CM | POA: Diagnosis not present

## 2018-08-12 DIAGNOSIS — Z800 Family history of malignant neoplasm of digestive organs: Secondary | ICD-10-CM

## 2018-08-12 DIAGNOSIS — I110 Hypertensive heart disease with heart failure: Secondary | ICD-10-CM | POA: Diagnosis present

## 2018-08-12 DIAGNOSIS — Z7401 Bed confinement status: Secondary | ICD-10-CM

## 2018-08-12 DIAGNOSIS — G8929 Other chronic pain: Secondary | ICD-10-CM | POA: Diagnosis present

## 2018-08-12 DIAGNOSIS — N136 Pyonephrosis: Secondary | ICD-10-CM | POA: Diagnosis present

## 2018-08-12 DIAGNOSIS — E870 Hyperosmolality and hypernatremia: Secondary | ICD-10-CM | POA: Diagnosis present

## 2018-08-12 DIAGNOSIS — Z7189 Other specified counseling: Secondary | ICD-10-CM | POA: Diagnosis not present

## 2018-08-12 DIAGNOSIS — J9610 Chronic respiratory failure, unspecified whether with hypoxia or hypercapnia: Secondary | ICD-10-CM | POA: Diagnosis not present

## 2018-08-12 DIAGNOSIS — Z1619 Resistance to other specified beta lactam antibiotics: Secondary | ICD-10-CM | POA: Diagnosis present

## 2018-08-12 DIAGNOSIS — Z515 Encounter for palliative care: Secondary | ICD-10-CM | POA: Diagnosis present

## 2018-08-12 DIAGNOSIS — Z90710 Acquired absence of both cervix and uterus: Secondary | ICD-10-CM

## 2018-08-12 DIAGNOSIS — Z1159 Encounter for screening for other viral diseases: Secondary | ICD-10-CM

## 2018-08-12 DIAGNOSIS — L89896 Pressure-induced deep tissue damage of other site: Secondary | ICD-10-CM | POA: Diagnosis present

## 2018-08-12 DIAGNOSIS — J9611 Chronic respiratory failure with hypoxia: Secondary | ICD-10-CM | POA: Diagnosis not present

## 2018-08-12 DIAGNOSIS — E876 Hypokalemia: Secondary | ICD-10-CM | POA: Diagnosis present

## 2018-08-12 DIAGNOSIS — Z95828 Presence of other vascular implants and grafts: Secondary | ICD-10-CM

## 2018-08-12 DIAGNOSIS — R58 Hemorrhage, not elsewhere classified: Secondary | ICD-10-CM

## 2018-08-12 DIAGNOSIS — L8994 Pressure ulcer of unspecified site, stage 4: Secondary | ICD-10-CM

## 2018-08-12 DIAGNOSIS — Z87891 Personal history of nicotine dependence: Secondary | ICD-10-CM

## 2018-08-12 DIAGNOSIS — E872 Acidosis: Secondary | ICD-10-CM | POA: Diagnosis present

## 2018-08-12 DIAGNOSIS — I5023 Acute on chronic systolic (congestive) heart failure: Secondary | ICD-10-CM | POA: Diagnosis present

## 2018-08-12 DIAGNOSIS — E785 Hyperlipidemia, unspecified: Secondary | ICD-10-CM | POA: Diagnosis present

## 2018-08-12 DIAGNOSIS — L89610 Pressure ulcer of right heel, unstageable: Secondary | ICD-10-CM | POA: Diagnosis present

## 2018-08-12 DIAGNOSIS — Z8673 Personal history of transient ischemic attack (TIA), and cerebral infarction without residual deficits: Secondary | ICD-10-CM

## 2018-08-12 DIAGNOSIS — J9621 Acute and chronic respiratory failure with hypoxia: Secondary | ICD-10-CM | POA: Diagnosis present

## 2018-08-12 DIAGNOSIS — K219 Gastro-esophageal reflux disease without esophagitis: Secondary | ICD-10-CM | POA: Diagnosis present

## 2018-08-12 DIAGNOSIS — Z79891 Long term (current) use of opiate analgesic: Secondary | ICD-10-CM

## 2018-08-12 DIAGNOSIS — M24541 Contracture, right hand: Secondary | ICD-10-CM | POA: Diagnosis present

## 2018-08-12 DIAGNOSIS — L89620 Pressure ulcer of left heel, unstageable: Secondary | ICD-10-CM | POA: Diagnosis present

## 2018-08-12 DIAGNOSIS — J440 Chronic obstructive pulmonary disease with acute lower respiratory infection: Secondary | ICD-10-CM | POA: Diagnosis present

## 2018-08-12 DIAGNOSIS — Z8674 Personal history of sudden cardiac arrest: Secondary | ICD-10-CM

## 2018-08-12 DIAGNOSIS — Z7901 Long term (current) use of anticoagulants: Secondary | ICD-10-CM

## 2018-08-12 DIAGNOSIS — I2510 Atherosclerotic heart disease of native coronary artery without angina pectoris: Secondary | ICD-10-CM | POA: Diagnosis present

## 2018-08-12 DIAGNOSIS — L89154 Pressure ulcer of sacral region, stage 4: Secondary | ICD-10-CM | POA: Diagnosis present

## 2018-08-12 DIAGNOSIS — G40909 Epilepsy, unspecified, not intractable, without status epilepticus: Secondary | ICD-10-CM | POA: Diagnosis present

## 2018-08-12 DIAGNOSIS — D638 Anemia in other chronic diseases classified elsewhere: Secondary | ICD-10-CM | POA: Diagnosis present

## 2018-08-12 DIAGNOSIS — L89104 Pressure ulcer of unspecified part of back, stage 4: Secondary | ICD-10-CM | POA: Diagnosis present

## 2018-08-12 DIAGNOSIS — I252 Old myocardial infarction: Secondary | ICD-10-CM

## 2018-08-12 DIAGNOSIS — J189 Pneumonia, unspecified organism: Secondary | ICD-10-CM | POA: Diagnosis present

## 2018-08-12 DIAGNOSIS — Y95 Nosocomial condition: Secondary | ICD-10-CM | POA: Diagnosis present

## 2018-08-12 DIAGNOSIS — Z823 Family history of stroke: Secondary | ICD-10-CM

## 2018-08-12 DIAGNOSIS — M24542 Contracture, left hand: Secondary | ICD-10-CM | POA: Diagnosis present

## 2018-08-12 DIAGNOSIS — Z79899 Other long term (current) drug therapy: Secondary | ICD-10-CM

## 2018-08-12 DIAGNOSIS — J9622 Acute and chronic respiratory failure with hypercapnia: Secondary | ICD-10-CM | POA: Diagnosis not present

## 2018-08-12 DIAGNOSIS — Z931 Gastrostomy status: Secondary | ICD-10-CM

## 2018-08-12 DIAGNOSIS — Z7982 Long term (current) use of aspirin: Secondary | ICD-10-CM

## 2018-08-12 DIAGNOSIS — Z1624 Resistance to multiple antibiotics: Secondary | ICD-10-CM | POA: Diagnosis present

## 2018-08-12 DIAGNOSIS — R6521 Severe sepsis with septic shock: Secondary | ICD-10-CM | POA: Diagnosis present

## 2018-08-12 DIAGNOSIS — J9601 Acute respiratory failure with hypoxia: Secondary | ICD-10-CM | POA: Diagnosis not present

## 2018-08-12 DIAGNOSIS — Z955 Presence of coronary angioplasty implant and graft: Secondary | ICD-10-CM

## 2018-08-12 DIAGNOSIS — M24512 Contracture, left shoulder: Secondary | ICD-10-CM | POA: Diagnosis present

## 2018-08-12 DIAGNOSIS — D6489 Other specified anemias: Secondary | ICD-10-CM | POA: Diagnosis not present

## 2018-08-12 LAB — COMPREHENSIVE METABOLIC PANEL
ALT: 18 U/L (ref 0–44)
AST: 17 U/L (ref 15–41)
Albumin: 1.9 g/dL — ABNORMAL LOW (ref 3.5–5.0)
Alkaline Phosphatase: 63 U/L (ref 38–126)
Anion gap: 10 (ref 5–15)
BUN: 92 mg/dL — ABNORMAL HIGH (ref 8–23)
CO2: 30 mmol/L (ref 22–32)
Calcium: 9.5 mg/dL (ref 8.9–10.3)
Chloride: 111 mmol/L (ref 98–111)
Creatinine, Ser: 1.03 mg/dL — ABNORMAL HIGH (ref 0.44–1.00)
GFR calc Af Amer: 59 mL/min — ABNORMAL LOW (ref >60)
GFR calc non Af Amer: 51 mL/min — ABNORMAL LOW (ref >60)
Glucose, Bld: 155 mg/dL — ABNORMAL HIGH (ref 70–99)
Potassium: 4.5 mmol/L (ref 3.5–5.1)
Sodium: 151 mmol/L — ABNORMAL HIGH (ref 135–145)
Total Bilirubin: 0.9 mg/dL (ref 0.3–1.2)
Total Protein: 6.4 g/dL — ABNORMAL LOW (ref 6.5–8.1)

## 2018-08-12 LAB — CBC WITH DIFFERENTIAL/PLATELET
Abs Immature Granulocytes: 0.71 10*3/uL — ABNORMAL HIGH (ref 0.00–0.07)
Basophils Absolute: 0.1 10*3/uL (ref 0.0–0.1)
Basophils Relative: 1 %
Eosinophils Absolute: 0.1 10*3/uL (ref 0.0–0.5)
Eosinophils Relative: 1 %
HCT: 33.6 % — ABNORMAL LOW (ref 36.0–46.0)
Hemoglobin: 8.7 g/dL — ABNORMAL LOW (ref 12.0–15.0)
Immature Granulocytes: 6 %
Lymphocytes Relative: 13 %
Lymphs Abs: 1.5 10*3/uL (ref 0.7–4.0)
MCH: 24.2 pg — ABNORMAL LOW (ref 26.0–34.0)
MCHC: 25.9 g/dL — ABNORMAL LOW (ref 30.0–36.0)
MCV: 93.3 fL (ref 80.0–100.0)
Monocytes Absolute: 0.5 10*3/uL (ref 0.1–1.0)
Monocytes Relative: 5 %
Neutro Abs: 8.6 10*3/uL — ABNORMAL HIGH (ref 1.7–7.7)
Neutrophils Relative %: 74 %
Platelets: 195 10*3/uL (ref 150–400)
RBC: 3.6 MIL/uL — ABNORMAL LOW (ref 3.87–5.11)
RDW: 17.3 % — ABNORMAL HIGH (ref 11.5–15.5)
WBC: 11.4 10*3/uL — ABNORMAL HIGH (ref 4.0–10.5)
nRBC: 0.4 % — ABNORMAL HIGH (ref 0.0–0.2)

## 2018-08-12 LAB — URINALYSIS, ROUTINE W REFLEX MICROSCOPIC
Bilirubin Urine: NEGATIVE
Glucose, UA: NEGATIVE mg/dL
Hgb urine dipstick: NEGATIVE
Ketones, ur: NEGATIVE mg/dL
Nitrite: NEGATIVE
Protein, ur: 100 mg/dL — AB
Specific Gravity, Urine: 1.021 (ref 1.005–1.030)
pH: 5 (ref 5.0–8.0)

## 2018-08-12 LAB — LACTIC ACID, PLASMA
Lactic Acid, Venous: 2.1 mmol/L (ref 0.5–1.9)
Lactic Acid, Venous: 2.2 mmol/L (ref 0.5–1.9)

## 2018-08-12 LAB — VANCOMYCIN, TROUGH: Vancomycin Tr: 31 ug/mL (ref 15–20)

## 2018-08-12 MED ORDER — VALPROIC ACID 250 MG/5ML PO SOLN
250.0000 mg | Freq: Two times a day (BID) | ORAL | Status: DC
  Administered 2018-08-13 – 2018-08-25 (×25): 250 mg
  Filled 2018-08-12 (×25): qty 5

## 2018-08-12 MED ORDER — SODIUM CHLORIDE 0.9 % IV SOLN
1.0000 g | Freq: Two times a day (BID) | INTRAVENOUS | Status: DC
  Administered 2018-08-13 – 2018-08-14 (×3): 1 g via INTRAVENOUS
  Filled 2018-08-12 (×8): qty 1

## 2018-08-12 MED ORDER — CHLORHEXIDINE GLUCONATE CLOTH 2 % EX PADS: 6.0000 | MEDICATED_PAD | Freq: Every day | CUTANEOUS | Status: DC

## 2018-08-12 MED ORDER — SODIUM CHLORIDE 0.9 % IV SOLN
INTRAVENOUS | Status: DC
  Administered 2018-08-12 – 2018-08-13 (×2): via INTRAVENOUS

## 2018-08-12 MED ORDER — ASPIRIN 81 MG PO CHEW: 81.0000 mg | CHEWABLE_TABLET | Freq: Every day | ORAL | Status: DC

## 2018-08-12 MED ORDER — PRO-STAT SUGAR FREE PO LIQD
30.0000 mL | Freq: Two times a day (BID) | ORAL | Status: DC
  Administered 2018-08-13 (×2): 30 mL
  Filled 2018-08-12 (×2): qty 30

## 2018-08-12 MED ORDER — ADULT MULTIVITAMIN LIQUID CH: 15.0000 mL | Freq: Every day | ORAL | Status: DC

## 2018-08-12 MED ORDER — CHLORHEXIDINE GLUCONATE 0.12 % MT SOLN
15.0000 mL | Freq: Two times a day (BID) | OROMUCOSAL | Status: DC
  Administered 2018-08-13 – 2018-08-22 (×14): 15 mL via OROMUCOSAL
  Filled 2018-08-12 (×13): qty 15

## 2018-08-12 MED ORDER — DIAZEPAM 5 MG PO TABS
2.5000 mg | ORAL_TABLET | Freq: Two times a day (BID) | ORAL | Status: DC
  Administered 2018-08-13 – 2018-08-17 (×8): 2.5 mg
  Filled 2018-08-12 (×8): qty 1

## 2018-08-12 MED ORDER — ACETAMINOPHEN 650 MG RE SUPP
650.0000 mg | Freq: Once | RECTAL | Status: AC
  Administered 2018-08-12: 650 mg via RECTAL
  Filled 2018-08-12: qty 1

## 2018-08-12 MED ORDER — CHLORHEXIDINE GLUCONATE 0.12% ORAL RINSE (MEDLINE KIT)
15.0000 mL | Freq: Two times a day (BID) | OROMUCOSAL | Status: DC
  Administered 2018-08-12 – 2018-08-25 (×25): 15 mL via OROMUCOSAL

## 2018-08-12 MED ORDER — SODIUM CHLORIDE 0.9% FLUSH: 10.0000 mL | Freq: Two times a day (BID) | INTRAVENOUS | Status: DC

## 2018-08-12 MED ORDER — APIXABAN 5 MG PO TABS
5.0000 mg | ORAL_TABLET | Freq: Two times a day (BID) | ORAL | Status: DC
  Administered 2018-08-13 – 2018-08-22 (×20): 5 mg
  Filled 2018-08-12 (×20): qty 1

## 2018-08-12 MED ORDER — JUVEN PO PACK
1.0000 | PACK | Freq: Two times a day (BID) | ORAL | Status: DC
  Administered 2018-08-13 – 2018-08-25 (×26): 1
  Filled 2018-08-12 (×26): qty 1

## 2018-08-12 MED ORDER — LEVETIRACETAM 100 MG/ML PO SOLN
1000.0000 mg | Freq: Two times a day (BID) | ORAL | Status: DC
  Administered 2018-08-13 – 2018-08-25 (×25): 1000 mg
  Filled 2018-08-12 (×26): qty 10

## 2018-08-12 MED ORDER — FAMOTIDINE 20 MG PO TABS
20.0000 mg | ORAL_TABLET | Freq: Every day | ORAL | Status: DC
  Administered 2018-08-13 – 2018-08-23 (×11): 20 mg
  Filled 2018-08-12 (×12): qty 1

## 2018-08-12 MED ORDER — FREE WATER
200.0000 mL | Status: DC
  Administered 2018-08-13 – 2018-08-14 (×6): 200 mL

## 2018-08-12 MED ORDER — ORAL CARE MOUTH RINSE
15.0000 mL | OROMUCOSAL | Status: DC
  Administered 2018-08-13 – 2018-08-25 (×120): 15 mL via OROMUCOSAL

## 2018-08-12 MED ORDER — ACETAMINOPHEN 160 MG/5ML PO SOLN: 650.0000 mg | Freq: Four times a day (QID) | ORAL | Status: DC | PRN

## 2018-08-12 MED ORDER — VANCOMYCIN VARIABLE DOSE PER UNSTABLE RENAL FUNCTION (PHARMACIST DOSING): Status: DC

## 2018-08-12 MED ORDER — VITAL AF 1.2 CAL PO LIQD
1000.0000 mL | ORAL | Status: DC
  Administered 2018-08-13: 08:00:00 1000 mL
  Filled 2018-08-12: qty 1000

## 2018-08-12 MED ORDER — SODIUM CHLORIDE 0.9 % IV SOLN
1.0000 g | Freq: Once | INTRAVENOUS | Status: AC
  Administered 2018-08-12: 1 g via INTRAVENOUS
  Filled 2018-08-12: qty 1

## 2018-08-12 MED ORDER — SODIUM CHLORIDE 0.9% FLUSH: 10.0000 mL | INTRAVENOUS | Status: DC | PRN

## 2018-08-12 MED ORDER — HYDROCODONE-ACETAMINOPHEN 5-325 MG PO TABS: 1.0000 | ORAL_TABLET | Freq: Three times a day (TID) | ORAL | Status: DC | PRN

## 2018-08-12 NOTE — ED Notes (Signed)
This RN and another Therapist, sports both had 2 unsuccessful IV attempts. Phlebotomy is here to draw labs.

## 2018-08-12 NOTE — ED Notes (Signed)
ED TO INPATIENT HANDOFF REPORT  ED Nurse Name and Phone #: [phone]  S Name/Age/Gender Melissa Cochran 83 y.o. female Room/Bed: 023C/023C  Code Status   Code Status: Prior  Home/SNF/Other Skilled nursing facility unresponsive Is this baseline? Yes   Triage Complete: Triage complete  Chief Complaint [facility]  Triage Note C/o from [facility] report of fever, 102.5 axillary. Unresponsive but this is baseline, trach and vent dependent. PEG tube, bedsores. Has been receiving vanc for UTI but spiked fever regardless. Chest X-ray showed bilateral infiltrates.    Allergies No Known Allergies  Level of Care/Admitting Diagnosis ED Disposition    ED Disposition Condition Comment   Admit  Hospital Area: [place] [[id number]]  Level of Care: Progressive [102]  Diagnosis: Bilateral pneumonia [779390]  Admitting Physician: Lind Covert [[id number]]  Attending Physician: Erin Hearing, MARSHALL L [[id number]]  Estimated length of stay: 3 - 4 days  Certification:: I certify this patient will need inpatient services for at least 2 midnights  Bed request comments: COVID R/O, HIGH RISK. Vent dependent, will need to be in ICU because of this. but progressive level of care  PT Class (Do Not Modify): Inpatient [101]  PT Acc Code (Do Not Modify): Private [1]       B Medical/Surgery History Past Medical History:  Diagnosis Date  . Adnexal mass   . Anxiety   . Arthritis   . Bladder tumor   . Bleeding ulcer   . CHF (congestive heart failure) ([place])   . CHF (congestive heart failure) ([place])   . Chronic bronchitis ([place])   . Chronic respiratory failure ([place]) 04/19/2018  . COPD (chronic obstructive pulmonary disease) ([place])   . Coronary artery disease   . CVA (cerebral vascular accident) ([place]) 04/19/2018  . DVT (deep venous thrombosis) ([facility])    RLE  . GERD (gastroesophageal reflux disease)   . Heart block    left  . History of bleeding ulcers   . History of kidney  stones   . Hyperlipemia   . Hypertension   . Myocardial infarction ([place])    "slight one" (07/05/2017)  . Pneumonia    "several times" (07/05/2017)  . Seizures ([place]) 02/2017 "several"; 06/26/2017 X 1  . Shortness of breath dyspnea   . Stroke [facility]) 02/13/2018   "still right sided weakness" (07/05/2017)   Past Surgical History:  Procedure Laterality Date  . ABDOMINAL HYSTERECTOMY    . CARDIAC CATHETERIZATION Left 10/21/2015   Procedure: Left Heart Cath and Coronary Angiography;  Surgeon: Isaias Cowman, MD;  Location: [facility];  Service: Cardiovascular;  Laterality: Left;  . CARDIAC CATHETERIZATION N/A 10/21/2015   Procedure: Coronary Stent Intervention;  Surgeon: Isaias Cowman, MD;  Location: [facility] [facility];  Service: Cardiovascular;  Laterality: N/A;  . CARDIAC CATHETERIZATION    . CYSTOSCOPY W/ RETROGRADES Bilateral 01/20/2016   Procedure: CYSTOSCOPY WITH RETROGRADE PYELOGRAM;  Surgeon: Hollice Espy, MD;  Location: [facility];  Service: Urology;  Laterality: Bilateral;  . CYSTOSCOPY WITH STENT PLACEMENT Right 01/20/2016   Procedure: CYSTOSCOPY WITH STENT PLACEMENT;  Surgeon: Hollice Espy, MD;  Location: [facility];  Service: Urology;  Laterality: Right;  . HIP FRACTURE SURGERY Left   . STOMACH SURGERY     "stomach ulcers"  . TRACHEOSTOMY TUBE PLACEMENT N/A 03/02/2017   Procedure: TRACHEOSTOMY;  Surgeon: Rozetta Nunnery, MD;  Location: [facility];  Service: ENT;  Laterality: N/A;  . TRANSURETHRAL RESECTION OF BLADDER TUMOR N/A 01/20/2016   Procedure: TRANSURETHRAL RESECTION OF  BLADDER TUMOR (TURBT);  Surgeon: Hollice Espy, MD;  Location: [facility];  Service: Urology;  Laterality: N/A;     A IV Location/Drains/Wounds Patient Lines/Drains/Airways Status   Active Line/Drains/Airways    Name:   Placement date:   Placement time:   Site:   Days:   PICC Single Lumen 04/18/18 PICC Right   04/18/18    1221    -   116   PICC Single Lumen 06/10/18 PICC Right    06/10/18    -    -   63   Gastrostomy/Enterostomy Gastrostomy LUQ   04/25/18    0800    LUQ   109   Rectal Tube/Pouch   04/20/18    1100    -   114   Rectal Tube/Pouch   06/11/18    1700    -   62   External Urinary Catheter   06/18/18    0230    -   55   Tracheostomy Portex 8 mm Cuffed   06/10/18    1128    8 mm   63   Tracheostomy Shiley 8 mm Cuffed   08/12/18    1831    8 mm   less than 1   Pressure Injury 04/18/18 Stage IV - Full thickness tissue loss with exposed bone, tendon or muscle.   04/18/18    1814     116   Pressure Injury 04/18/18 Unstageable - Full thickness tissue loss in which the base of the ulcer is covered by slough (yellow, tan, gray, green or brown) and/or eschar (tan, brown or black) in the wound bed. Has tunneling at 4 o clock, yellow slough aro   04/18/18    1815     116   Pressure Injury 06/10/18 Stage IV - Full thickness tissue loss with exposed bone, tendon or muscle. bone palpable    06/10/18    1700     63   Wound / Incision (Open or Dehisced) 06/26/17 Non-pressure wound Leg Left;Lateral   06/26/17    0115    Leg   412          Intake/Output Last 24 hours No intake or output data in the 24 hours ending 08/12/18 2139  Labs/Imaging Results for orders placed or performed during the hospital encounter of 08/12/18 (from the past 48 hour(s))  Comprehensive metabolic panel     Status: Abnormal   Collection Time: 08/12/18  6:56 PM  Result Value Ref Range   Sodium 151 (H) 135 - 145 mmol/L   Potassium 4.5 3.5 - 5.1 mmol/L   Chloride 111 98 - 111 mmol/L   CO2 30 22 - 32 mmol/L   Glucose, Bld 155 (H) 70 - 99 mg/dL   BUN 92 (H) 8 - 23 mg/dL   Creatinine, Ser 1.03 (H) 0.44 - 1.00 mg/dL   Calcium 9.5 8.9 - 10.3 mg/dL   Total Protein 6.4 (L) 6.5 - 8.1 g/dL   Albumin 1.9 (L) 3.5 - 5.0 g/dL   AST 17 15 - 41 U/L   ALT 18 0 - 44 U/L   Alkaline Phosphatase 63 38 - 126 U/L   Total Bilirubin 0.9 0.3 - 1.2 mg/dL   GFR calc non Af Amer 51 (L) >60 mL/min   GFR calc Af Amer  59 (L) >60 mL/min   Anion gap 10 5 - 15    Comment: Performed at [facility], [address] [place], [place]  [address]  CBC WITH DIFFERENTIAL     Status: Abnormal   Collection Time: 08/12/18  6:56 PM  Result Value Ref Range   WBC 11.4 (H) 4.0 - 10.5 K/uL   RBC 3.60 (L) 3.87 - 5.11 MIL/uL   Hemoglobin 8.7 (L) 12.0 - 15.0 g/dL   HCT 33.6 (L) 36.0 - 46.0 %   MCV 93.3 80.0 - 100.0 fL   MCH 24.2 (L) 26.0 - 34.0 pg   MCHC 25.9 (L) 30.0 - 36.0 g/dL   RDW 17.3 (H) 11.5 - 15.5 %   Platelets 195 150 - 400 K/uL   nRBC 0.4 (H) 0.0 - 0.2 %   Neutrophils Relative % 74 %   Neutro Abs 8.6 (H) 1.7 - 7.7 K/uL   Lymphocytes Relative 13 %   Lymphs Abs 1.5 0.7 - 4.0 K/uL   Monocytes Relative 5 %   Monocytes Absolute 0.5 0.1 - 1.0 K/uL   Eosinophils Relative 1 %   Eosinophils Absolute 0.1 0.0 - 0.5 K/uL   Basophils Relative 1 %   Basophils Absolute 0.1 0.0 - 0.1 K/uL   WBC Morphology See Note     Comment: Toxic Granulation  Dohle Bodies    Immature Granulocytes 6 %   Abs Immature Granulocytes 0.71 (H) 0.00 - 0.07 K/uL    Comment: Performed at [facility], [address], [place], [place] [address]  Urinalysis, Routine w reflex microscopic (not at [facility])     Status: Abnormal   Collection Time: 08/12/18  6:56 PM  Result Value Ref Range   Color, Urine AMBER (A) YELLOW    Comment: BIOCHEMICALS MAY BE AFFECTED BY COLOR   APPearance CLOUDY (A) CLEAR   Specific Gravity, Urine 1.021 1.005 - 1.030   pH 5.0 5.0 - 8.0   Glucose, UA NEGATIVE NEGATIVE mg/dL   Hgb urine dipstick NEGATIVE NEGATIVE   Bilirubin Urine NEGATIVE NEGATIVE   Ketones, ur NEGATIVE NEGATIVE mg/dL   Protein, ur 100 (A) NEGATIVE mg/dL   Nitrite NEGATIVE NEGATIVE   Leukocytes,Ua SMALL (A) NEGATIVE   RBC / HPF 11-20 0 - 5 RBC/hpf   WBC, UA 21-50 0 - 5 WBC/hpf   Bacteria, UA FEW (A) NONE SEEN   Squamous Epithelial / LPF 6-10 0 - 5   Granular Casts, UA PRESENT    Non Squamous Epithelial 0-5 (A) NONE SEEN    Comment:  Performed at [facility], [address] [place], [place] [address]  Lactic acid, plasma     Status: Abnormal   Collection Time: 08/12/18  6:56 PM  Result Value Ref Range   Lactic Acid, Venous 2.1 (HH) 0.5 - 1.9 mmol/L    Comment: CRITICAL RESULT CALLED TO, READ BACK BY AND VERIFIED WITH: PHILLIPS, T RN @ 1937 ON 08/12/2018 BY TEMOCHE, H Performed at [facility], [place] [address] [place], [place] [address]   Lactic acid, plasma     Status: Abnormal   Collection Time: 08/12/18  8:37 PM  Result Value Ref Range   Lactic Acid, Venous 2.2 (HH) 0.5 - 1.9 mmol/L    Comment: CRITICAL RESULT CALLED TO, READ BACK BY AND VERIFIED WITH: Marzella Schlein RN @ 2100 ON 08/12/2018 BY TEMOCHE, H Performed at [facility], [place] [address], [place], [place] [address]   I-STAT 7, (LYTES, BLD GAS, ICA, H+H)     Status: Abnormal   Collection Time: 08/12/18  8:59 PM  Result Value Ref Range   pH, Arterial 7.443  7.350 - 7.450   pCO2 arterial 54.6 (H) 32.0 - 48.0 mmHg   pO2, Arterial 100.0 83.0 - 108.0 mmHg   Bicarbonate 36.6 (H) 20.0 - 28.0 mmol/L   TCO2 38 (H) 22 - 32 mmol/L   O2 Saturation 97.0 %   Acid-Base Excess 11.0 (H) 0.0 - 2.0 mmol/L   Sodium 158 (H) 135 - 145 mmol/L   Potassium 4.3 3.5 - 5.1 mmol/L   Calcium, Ion 1.38 1.15 - 1.40 mmol/L   HCT 48.0 (H) 36.0 - 46.0 %   Hemoglobin 16.3 (H) 12.0 - 15.0 g/dL   Patient temperature 102.5 F    Collection site RADIAL, ALLEN'S TEST ACCEPTABLE    Drawn by RT    Sample type ARTERIAL    Dg Chest Port 1 View  Result Date: 08/12/2018 CLINICAL DATA:  Fever.  Tracheostomy with ventilator dependence. EXAM: PORTABLE CHEST 1 VIEW COMPARISON:  06/13/2018 and multiple previous FINDINGS: Heart size is normal. Chronic aortic atherosclerosis. Chronic tracheostomy. Right arm PICC tip in the SVC the level of the carina. Background chronic abnormal interstitial lung markings. Acute hazy pneumonia in the mid and lower lungs bilaterally. Findings appear similar  to the study of February, probably slightly worsened. IMPRESSION: Worsening of mid and lower lung infiltrates. Tracheostomy and right arm PICC appeared well positioned. Electronically Signed   By: Nelson Chimes M.D.   On: 08/12/2018 19:35    Pending Labs Unresulted Labs (From admission, onward)    Start     Ordered   08/12/18 2022  Vancomycin, trough  Once,   TIMED     08/12/18 2022   08/12/18 1959  Novel Coronavirus, NAA (hospital order; send-out to ref lab)  (Novel Coronavirus, NAA [facility] Order; send-out to ref lab) with precautions panel)  Once,   R    Question Answer Comment  Current symptoms Fever and Cough   Excluded other viral illnesses Yes   Exposure Risk None   Patient immune status Normal      08/12/18 1958   08/12/18 1824  Blood Culture (routine x 2)  BLOOD CULTURE X 2,   STAT     08/12/18 1824   08/12/18 1824  Urine culture  ONCE - STAT,   STAT     08/12/18 1824          Vitals/Pain Today's Vitals   08/12/18 1900 08/12/18 1906 08/12/18 1915 08/12/18 2053  BP: 104/62  (!) 108/53 125/76  Pulse: (!) 115  (!) 115 (!) 106  Resp: 20  17 20   Temp:  (!) 102.5 F (39.2 C)    TempSrc:  Rectal    SpO2: 97%  97% 98%  Weight:      Height:        Isolation Precautions Airborne and Contact precautions  Medications Medications  meropenem (MERREM) 1 g in sodium chloride 0.9 % 100 mL IVPB (has no administration in time range)  meropenem (MERREM) 1 g in sodium chloride 0.9 % 100 mL IVPB (0 g Intravenous Stopped 08/12/18 1947)  acetaminophen (TYLENOL) suppository 650 mg (650 mg Rectal Given 08/12/18 2024)    Mobility non-ambulatory Low fall risk   Focused Assessments Pulmonary Assessment Handoff:  Lung sounds: Bilateral Breath Sounds: Rhonchi O2 Device: Ventilator        R Recommendations: See Admitting Provider Note  Report given to:   Additional Notes:

## 2018-08-12 NOTE — Progress Notes (Addendum)
Pharmacy Antibiotic Note  Melissa Cochran is a 83 y.o. female admitted on 08/12/2018 with HCAP.  Pharmacy has been consulted for meropenem dosing.  From [facility] with temp of 102.5. Was on vancomycin/zosyn for UTI (UCx E Coli - S zosyn). CXR showing bilateral infiltrates- trach cx growing GNR/GPR at [facility]. Recent trach aspirate on 06/13/2018 for Acinetobacter (S imipenem). Scr 1.03 (CrCl 43 mL/min) - previously Scr were 0.3-0.4. WBC 11.4, LA 2.1.   Last dose of vancomycin 1 g IV every 12 hours was on 4/4@0900  - trough was supratherapeutic at 31.  Plan: Hold vancomycin dosing and get random with morning labs to assess clearance  Meropenem 1 g IV every 12 hours Monitor renal fx, clinical pic, cx results  Height: 5\' 4"  (162.6 cm) Weight: 174 lb 12.8 oz (79.3 kg) IBW/kg (Calculated) : 54.7  Temp (24hrs), Avg:102.5 F (39.2 C), Min:102.5 F (39.2 C), Max:102.5 F (39.2 C)  Recent Labs  Lab 08/12/18 1856  WBC 11.4*  CREATININE 1.03*  LATICACIDVEN 2.1*    Estimated Creatinine Clearance: 43.6 mL/min (A) (by C-G formula based on SCr of 1.03 mg/dL (H)).    No Known Allergies  Antimicrobials this admission: Meropenem 4/4 >>  Vancomycin 4/4>>  Dose adjustments this admission: N/A  Microbiology results: 4/4 BCx: sent 4/4 UCx:  sent  Thank you for allowing pharmacy to be a part of this patient's care.  Antonietta Jewel, PharmD, [place] Clinical Pharmacist  Pager: [phone] Phone: [phone] 08/12/2018 8:02 PM

## 2018-08-12 NOTE — Progress Notes (Signed)
Pt transported to 79m after suctioning. RT will monitor.

## 2018-08-12 NOTE — ED Triage Notes (Signed)
C/o from [facility] report of fever, 102.5 axillary. Unresponsive but this is baseline, trach and vent dependent. PEG tube, bedsores. Has been receiving vanc for UTI but spiked fever regardless. Chest X-ray showed bilateral infiltrates.

## 2018-08-12 NOTE — ED Provider Notes (Signed)
[facility] EMERGENCY DEPARTMENT Provider Note   CSN: [id number] Arrival date & time: 08/12/18  1806    History   Chief Complaint Chief Complaint  Patient presents with  . Fever    HPI QUIARA Cochran is a 83 y.o. female hx of chronic atrial fibrillation on apixaban, HTN, CAD s/p stent in 0160, chronic systolic CHF with EF 10-93% 06/28/2017, nephrolithiasis complicated by hydronephrosis status post stent placement in 2017, CVA, seizure disorder, and chronic respiratory failure with trach vent/PEG dependence who presentedfrom [facility] for fever of 102.5 F despite being treated with vancomycin and Zosyn for presumed urinary tract infection.  Chest x-ray at outside facility concerning for bilateral pneumonia.  No other individuals in the facility have tested positive for or are concerned for COVID-19 infection.  Of note, the patient has been admitted here 3 times over the past 2 months for undifferentiated sepsis.       Illness  Severity:  Severe Onset quality:  Gradual Duration:  3 days Timing:  Constant Progression:  Worsening Chronicity:  New   Past Medical History:  Diagnosis Date  . Adnexal mass   . Anxiety   . Arthritis   . Bladder tumor   . Bleeding ulcer   . CHF (congestive heart failure) ([place])   . CHF (congestive heart failure) ([place])   . Chronic bronchitis ([place])   . Chronic respiratory failure ([place]) 04/19/2018  . COPD (chronic obstructive pulmonary disease) ([place])   . Coronary artery disease   . CVA (cerebral vascular accident) ([place]) 04/19/2018  . DVT (deep venous thrombosis) ([facility])    RLE  . GERD (gastroesophageal reflux disease)   . Heart block    left  . History of bleeding ulcers   . History of kidney stones   . Hyperlipemia   . Hypertension   . Myocardial infarction ([place])    "slight one" (07/05/2017)  . Pneumonia    "several times" (07/05/2017)  . Seizures ([place]) 02/2017 "several"; 06/26/2017 X 1  . Shortness of breath dyspnea    . Stroke [facility]) 02/13/2018   "still right sided weakness" (07/05/2017)    Patient Active Problem List   Diagnosis Date Noted  . Bilateral pneumonia 08/12/2018  . Acute respiratory failure with hypoxia ([place])   . CVA (cerebral vascular accident) ([place]) 04/19/2018  . Chronic respiratory failure ([place]) 04/19/2018  . Acute encephalopathy 04/18/2018  . E-coli UTI, ESBL  06/30/2017  . A-fib ([place]) 06/30/2017  . Need for protective airway ventilation 06/30/2017  . Seizures ([place]) 06/26/2017  . Acute renal failure ([place])   . Elevated troponin I level   . Acute renal failure with tubular necrosis ([place])   . Palliative care encounter   . Pressure injury of skin 02/17/2017  . Bilateral lower extremity edema 10/08/2016  . Venous ulcer of ankle ([place]) 07/08/2016  . DVT (deep venous thrombosis) ([place]) 07/02/2016  . Swelling of limb 06/08/2016  . Pain in limb 06/08/2016  . Altered mental status 01/22/2016  . S/P coronary artery stent placement 10/30/2015  . Preop cardiovascular exam 10/08/2015  . SOB (shortness of breath) on exertion 10/08/2015  . Malignant neoplasm of trigone of bladder ([place]) 09/29/2015  . Mass of lower lobe of left lung, incidetnal on CT 09/2015, smoker.  09/29/2015  . Other disorders of lung 09/29/2015  . Other microscopic hematuria 08/26/2015  . Kidney stone 08/26/2015  . Urge incontinence 08/26/2015  . Pneumonia 01/24/2015  . PNA (pneumonia) 01/24/2015  . Essential (primary) hypertension 12/09/2014  .  Adnexal mass 11/28/2013  . History of cardiac catheterization 11/28/2013  . Hyperlipidemia 11/28/2013  . Other specified postprocedural states 11/28/2013    Past Surgical History:  Procedure Laterality Date  . ABDOMINAL HYSTERECTOMY    . CARDIAC CATHETERIZATION Left 10/21/2015   Procedure: Left Heart Cath and Coronary Angiography;  Surgeon: Isaias Cowman, MD;  Location: [facility];  Service: Cardiovascular;  Laterality: Left;  . CARDIAC CATHETERIZATION N/A  10/21/2015   Procedure: Coronary Stent Intervention;  Surgeon: Isaias Cowman, MD;  Location: [facility];  Service: Cardiovascular;  Laterality: N/A;  . CARDIAC CATHETERIZATION    . CYSTOSCOPY W/ RETROGRADES Bilateral 01/20/2016   Procedure: CYSTOSCOPY WITH RETROGRADE PYELOGRAM;  Surgeon: Hollice Espy, MD;  Location: [facility];  Service: Urology;  Laterality: Bilateral;  . CYSTOSCOPY WITH STENT PLACEMENT Right 01/20/2016   Procedure: CYSTOSCOPY WITH STENT PLACEMENT;  Surgeon: Hollice Espy, MD;  Location: [facility];  Service: Urology;  Laterality: Right;  . HIP FRACTURE SURGERY Left   . STOMACH SURGERY     "stomach ulcers"  . TRACHEOSTOMY TUBE PLACEMENT N/A 03/02/2017   Procedure: TRACHEOSTOMY;  Surgeon: Rozetta Nunnery, MD;  Location: [facility];  Service: ENT;  Laterality: N/A;  . TRANSURETHRAL RESECTION OF BLADDER TUMOR N/A 01/20/2016   Procedure: TRANSURETHRAL RESECTION OF BLADDER TUMOR (TURBT);  Surgeon: Hollice Espy, MD;  Location: [facility];  Service: Urology;  Laterality: N/A;     OB History   No obstetric history on file.      Home Medications    Prior to Admission medications   Medication Sig Start Date End Date Taking? Authorizing Provider  Amino Acids-Protein Hydrolys (FEEDING SUPPLEMENT, PRO-STAT SUGAR FREE 64,) LIQD Place 30 mLs into feeding tube 2 (two) times daily. 06/26/18  Yes Cherene Altes, MD  apixaban (ELIQUIS) 5 MG TABS tablet Place 1 tablet (5 mg total) into feeding tube 2 (two) times daily. 06/26/18  Yes Cherene Altes, MD  carvedilol (COREG) 6.25 MG tablet Place 1 tablet (6.25 mg total) into feeding tube 2 (two) times daily with a meal. 06/19/18  Yes Cherene Altes, MD  chlorhexidine (PERIDEX) 0.12 % solution 5 mLs See admin instructions. 5 ml's for trach care 2 times a day   Yes [provider]  diazepam (VALIUM) 5 MG tablet Place 0.5 tablets (2.5 mg total) into feeding tube every 12 (twelve) hours. 06/19/18  Yes Cherene Altes, MD  famotidine (PEPCID) 20 MG tablet Place 20 mg into feeding tube daily.    Yes [provider]  furosemide (LASIX) 40 MG tablet Place 1 tablet (40 mg total) into feeding tube daily. Patient taking differently: Place 40 mg into feeding tube 2 (two) times daily.  06/19/18  Yes Cherene Altes, MD  HYDROcodone-acetaminophen (NORCO/VICODIN) 5-325 MG tablet Place 1 tablet into feeding tube every 8 (eight) hours.    Yes [provider]  ipratropium-albuterol (DUONEB) 0.5-2.5 (3) MG/3ML SOLN Take 3 mLs by nebulization as needed (for acute and chronic respiratory failure, unspecified whether with hypoxia or hypercapnia).    Yes [provider]  levETIRAcetam (KEPPRA) 100 MG/ML solution Place 10 mLs (1,000 mg total) into feeding tube 2 (two) times daily. 06/19/18  Yes Cherene Altes, MD  morphine (MSIR) 15 MG tablet Place 7.5 mg into feeding tube every 6 (six) hours as needed (for dyspnea).   Yes [provider]  Multiple Vitamin (MULTIVITAMIN) tablet Place 1 tablet into feeding tube daily.   Yes [provider]  nutrition supplement, JUVEN, (JUVEN) PACK Place 1 packet into feeding tube 2 (two) times daily between meals. 06/19/18  Yes Cherene Altes, MD  Nutritional Supplements (ISOSOURCE 1.5 CAL) LIQD Place 70 mLs into feeding tube 2 (two) times daily.    Yes [provider]  piperacillin-tazobactam (ZOSYN) 3.375 (3-0.375) g injection Inject 3.375 g into the vein every 8 (eight) hours. FOR 10 DAYS 08/09/18 08/18/18 Yes [provider]  Sodium Chloride Flush (NORMAL SALINE FLUSH IV) Inject 10 mLs into the vein See admin instructions. Use 10 ml's via IV every shift for any used lumens- flush each unused lumen   Yes [provider]  Valproate Sodium (DEPAKENE) 250 MG/5ML SOLN solution Place 5 mLs (250 mg total) into feeding tube 2 (two) times daily. 06/19/18  Yes Joette Catching T, MD  vancomycin 1,000 mg in sodium chloride 0.9 %  250 mL Inject 1,000 mg into the vein daily. FOR 10 DAYS 08/09/18 08/18/18 Yes [provider]  acetaminophen (TYLENOL) 160 MG/5ML solution Place 20.3 mLs (650 mg total) into feeding tube every 6 (six) hours as needed for mild pain, headache or fever. Patient not taking: Reported on 08/12/2018 06/19/18   Cherene Altes, MD  aspirin 81 MG chewable tablet Place 1 tablet (81 mg total) into feeding tube daily. Patient not taking: Reported on 08/12/2018 06/19/18   Cherene Altes, MD  collagenase (SANTYL) ointment Apply topically daily. Patient not taking: Reported on 08/12/2018 06/18/18   Georgette Shell, MD  Multiple Vitamin (MULTIVITAMIN) LIQD Place 15 mLs into feeding tube daily. Patient not taking: Reported on 08/12/2018 06/19/18   Cherene Altes, MD  Nutritional Supplements (FEEDING SUPPLEMENT, VITAL AF 1.2 CAL,) LIQD Place 1,000 mLs into feeding tube continuous. Patient not taking: Reported on 08/12/2018 06/26/18   Cherene Altes, MD  Water For Irrigation, Sterile (FREE WATER) SOLN Place 200 mLs into feeding tube every 4 (four) hours. Patient not taking: Reported on 08/12/2018 06/17/18   Georgette Shell, MD    Family History Family History  Problem Relation Age of Onset  . Stomach cancer Mother   . CVA Father   . Bladder Cancer Neg Hx   . Kidney cancer Neg Hx     Social History Social History   Tobacco Use  . Smoking status: Former Smoker    Packs/day: 1.00    Years: 40.00    Pack years: 40.00    Last attempt to quit: 06/13/1998    Years since quitting: 20.1  . Smokeless tobacco: Never Used  Substance Use Topics  . Alcohol use: No  . Drug use: No     Allergies   Patient has no known allergies.   Review of Systems Review of Systems  Unable to perform ROS: Patient nonverbal     Physical Exam Updated Vital Signs BP (!) 92/55 (BP Location: Left Arm)   Pulse (!) 114   Temp (!) 100.5 F (38.1 C) (Axillary)   Resp (!) 21   Ht _0  (1.626 m)   Wt 79.3 kg    SpO2 100%   BMI 30.00 kg/m   Physical Exam Vitals signs and nursing note reviewed.  Constitutional:      Appearance: She is well-developed.  HENT:     Head: Normocephalic and atraumatic.  Eyes:     Conjunctiva/sclera: Conjunctivae normal.  Neck:     Musculoskeletal: Neck supple.     Comments: Trach in place with no surrounding erythema, edema, or induration. Cardiovascular:  Rate and Rhythm: Normal rate and regular rhythm.     Heart sounds: No murmur.  Pulmonary:     Breath sounds: Rhonchi present.     Comments: Course breath sounds appreciated bilaterally.  Abdominal:     Palpations: Abdomen is soft.     Tenderness: There is no abdominal tenderness.     Comments: PEG tube in place with no surrounding erythema, edema, or induration.  Musculoskeletal:     Right lower leg: No edema.     Left lower leg: No edema.     Comments: PICC line in place in RUE with no surrounding erythema, edema, or induration.   Skin:    General: Skin is warm and dry.     Comments: Stage II sacral decubitus ulcer as well as stage II thoracic pressure ulcer with no surrounding erythema, edema, or induration or purulent drainage to suggest acute infection.    Neurological:     Mental Status: Mental status is at baseline.     GCS: GCS eye subscore is 1. GCS verbal subscore is 1. GCS motor subscore is 1.      ED Treatments / Results  Labs (all labs ordered are listed, but only abnormal results are displayed) Labs Reviewed  COMPREHENSIVE METABOLIC PANEL - Abnormal; Notable for the following components:      Result Value   Sodium 151 (*)    Glucose, Bld 155 (*)    BUN 92 (*)    Creatinine, Ser 1.03 (*)    Total Protein 6.4 (*)    Albumin 1.9 (*)    GFR calc non Af Amer 51 (*)    GFR calc Af Amer 59 (*)    All other components within normal limits  CBC WITH DIFFERENTIAL/PLATELET - Abnormal; Notable for the following components:   WBC 11.4 (*)    RBC 3.60 (*)    Hemoglobin 8.7 (*)    HCT  33.6 (*)    MCH 24.2 (*)    MCHC 25.9 (*)    RDW 17.3 (*)    nRBC 0.4 (*)    Neutro Abs 8.6 (*)    Abs Immature Granulocytes 0.71 (*)    All other components within normal limits  URINALYSIS, ROUTINE W REFLEX MICROSCOPIC - Abnormal; Notable for the following components:   Color, Urine AMBER (*)    APPearance CLOUDY (*)    Protein, ur 100 (*)    Leukocytes,Ua SMALL (*)    Bacteria, UA FEW (*)    Non Squamous Epithelial 0-5 (*)    All other components within normal limits  LACTIC ACID, PLASMA - Abnormal; Notable for the following components:   Lactic Acid, Venous 2.1 (*)    All other components within normal limits  LACTIC ACID, PLASMA - Abnormal; Notable for the following components:   Lactic Acid, Venous 2.2 (*)    All other components within normal limits  VANCOMYCIN, TROUGH - Abnormal; Notable for the following components:   Vancomycin Tr 31 (*)    All other components within normal limits  POCT I-STAT 7, (LYTES, BLD GAS, ICA,H+H) - Abnormal; Notable for the following components:   pCO2 arterial 54.6 (*)    Bicarbonate 36.6 (*)    TCO2 38 (*)    Acid-Base Excess 11.0 (*)    Sodium 158 (*)    HCT 48.0 (*)    Hemoglobin 16.3 (*)    All other components within normal limits  CULTURE, BLOOD (ROUTINE X 2)  CULTURE, BLOOD (ROUTINE X 2)  URINE CULTURE  NOVEL CORONAVIRUS, NAA (HOSPITAL ORDER, SEND-OUT TO REF LAB)  RESPIRATORY PANEL BY PCR  MRSA PCR SCREENING  VANCOMYCIN, RANDOM  INFLUENZA PANEL BY PCR (TYPE A & B)  CBC WITH DIFFERENTIAL/PLATELET  BASIC METABOLIC PANEL  C-REACTIVE PROTEIN  D-DIMER, QUANTITATIVE (NOT AT [facility])  FERRITIN  PROCALCITONIN  PROCALCITONIN  TROPONIN I  TROPONIN I  TROPONIN I    EKG EKG Interpretation  Date/Time:  Saturday August 12 2018 18:31:08 EDT Ventricular Rate:  104 PR Interval:    QRS Duration: 76 QT Interval:  287 QTC Calculation: 378 R Axis:   29 Text Interpretation:  Atrial fibrillation Probable anterior infarct, age  indeterminate No significant change since last tracing Confirmed by Deno Etienne [phone]) on 08/12/2018 8:53:13 PM   Radiology Dg Chest Port 1 View  Result Date: 08/12/2018 CLINICAL DATA:  Fever.  Tracheostomy with ventilator dependence. EXAM: PORTABLE CHEST 1 VIEW COMPARISON:  06/13/2018 and multiple previous FINDINGS: Heart size is normal. Chronic aortic atherosclerosis. Chronic tracheostomy. Right arm PICC tip in the SVC the level of the carina. Background chronic abnormal interstitial lung markings. Acute hazy pneumonia in the mid and lower lungs bilaterally. Findings appear similar to the study of February, probably slightly worsened. IMPRESSION: Worsening of mid and lower lung infiltrates. Tracheostomy and right arm PICC appeared well positioned. Electronically Signed   By: Nelson Chimes M.D.   On: 08/12/2018 19:35    Procedures Procedures (including critical care time)  Medications Ordered in ED Medications  meropenem (MERREM) 1 g in sodium chloride 0.9 % 100 mL IVPB (has no administration in time range)  vancomycin variable dose per unstable renal function (pharmacist dosing) (has no administration in time range)  acetaminophen (TYLENOL) solution 650 mg (has no administration in time range)  HYDROcodone-acetaminophen (NORCO/VICODIN) 5-325 MG per tablet 1 tablet (has no administration in time range)  diazepam (VALIUM) tablet 2.5 mg (has no administration in time range)  famotidine (PEPCID) tablet 20 mg (has no administration in time range)  apixaban (ELIQUIS) tablet 5 mg (has no administration in time range)  free water 200 mL (has no administration in time range)  levETIRAcetam (KEPPRA) 100 MG/ML solution 1,000 mg (has no administration in time range)  valproic acid (DEPAKENE) solution 250 mg (has no administration in time range)  feeding supplement (PRO-STAT SUGAR FREE 64) liquid 30 mL (has no administration in time range)  nutrition supplement (JUVEN) (JUVEN) powder packet 1 packet (has  no administration in time range)  feeding supplement (VITAL AF 1.2 CAL) liquid 1,000 mL (has no administration in time range)  chlorhexidine (PERIDEX) 0.12 % solution 15 mL (has no administration in time range)  0.9 %  sodium chloride infusion ( Intravenous New Bag/Given 08/12/18 2332)  chlorhexidine gluconate (MEDLINE KIT) (PERIDEX) 0.12 % solution 15 mL (15 mLs Mouth Rinse Given 08/12/18 2330)  MEDLINE mouth rinse (has no administration in time range)  sodium chloride flush (NS) 0.9 % injection 10-40 mL (has no administration in time range)  sodium chloride flush (NS) 0.9 % injection 10-40 mL (has no administration in time range)  Chlorhexidine Gluconate Cloth 2 % PADS 6 each (has no administration in time range)  sodium chloride flush (NS) 0.9 % injection 10-40 mL (has no administration in time range)  sodium chloride flush (NS) 0.9 % injection 10-40 mL (has no administration in time range)  Chlorhexidine Gluconate Cloth 2 % PADS 6 each (has no administration in time range)  meropenem (MERREM) 1 g in sodium chloride 0.9 % 100  mL IVPB (0 g Intravenous Stopped 08/12/18 1947)  acetaminophen (TYLENOL) suppository 650 mg (650 mg Rectal Given 08/12/18 2024)  alteplase (CATHFLO ACTIVASE) injection 2 mg (2 mg Intracatheter Given 08/13/18 0030)     Initial Impression / Assessment and Plan / ED Course  I have reviewed the triage vital signs and the nursing notes.  Pertinent labs & imaging results that were available during my care of the patient were reviewed by me and considered in my medical decision making (see chart for details).       Melissa Cochran is a 83 y.o. 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  On initial evaluation of the patient she was chronically ill-appearing.  She was tachycardic with irregularly irregular rhythm.  Borderline hypotensive with IV fluids currently running.  And febrile to 102.5.  Physical exam as detailed above which is remarkable for chronically ill ill-appearing elderly female.  Lungs with coarse breath sounds bilaterally.  Patient is largely at her neurologic baseline as on her good days she only occasionally opens her eyes.  Abdomen is benign.  No signs of infection at line insertion sites or on pressure ulcers.  EKG shows irregularly irregular rhythm at 105 bpm consistent with known history of atrial fibrillation.  No ischemic changes are present.  Labs/cultures were obtained at this time, and the patient was started on meropenem with continuation of vancomycin.   CBC remarkable for mild leukocytosis of 11.4 and hemoglobin consistent with baseline.  Metabolic panel shows mild hyponatremia and worsening creatinine when compared with prior studies.  Lactic acid is only minimally elevated at 2.1.  Chest x-ray concerning for worsening mid and lower lung infiltrates bilaterally.  Given current pandemic and bilateral nature of the patient's infiltrates, COVID-19 swab was obtained.  As the patient is largely hemodynamically stable with stable vent settings, feel she is appropriate for stepdown unit.  Case was discussed with Dr. Tammi Klippel from family medicine who is in agreement with admission at this time for sepsis secondary to bilateral pneumonia.  Patient was in stable condition at time of admission.    LIV RALLIS was evaluated in Emergency Department on 08/13/2018 for the symptoms described in the history of present illness. She was evaluated in the context of the global COVID-19 pandemic, which necessitated  consideration that the patient might be at risk for infection with the SARS-CoV-2 virus that causes COVID-19. Institutional protocols and algorithms that pertain to the evaluation of patients at risk for COVID-19 are in a state of rapid change based on information released by regulatory bodies including the CDC and federal and state organizations. These policies and algorithms were followed during the patient's care in the ED.  Final Clinical Impressions(s) / ED Diagnoses   Final diagnoses:  Pneumonia of both lower lobes due to infectious organism ([place])  Sepsis with acute organ dysfunction without septic shock, due to unspecified organism, unspecified type [facility])  Chronic respiratory failure with hypoxia [facility])    ED Discharge Orders    None       Tommie Raymond, MD 08/13/18 [place], [place], DO 08/13/18 1518

## 2018-08-12 NOTE — H&P (Addendum)
[facility] Admission History and Physical Service Pager: [phone]  Patient name: Melissa Cochran Medical record number: [id number] Date of birth: May 17, 1935 Age: 83 y.o. Gender: female  Primary Care Provider: [name], MD Consultants: CCM Code Status: Full (confirmed with daughter, [name], via telephone)  Chief Complaint: Fever  Assessment and Plan: Melissa Cochran is a 83 y.o. female presenting with fever from [facility] following treatment for presumed UTI with vancomycin and Zosyn. PMH is significant for chronic respiratory failure, trach vent dependent, PEG dependent, A. fib on apixaban, hypertension, CAD s/p stent 6761, chronic systolic CHF (EF 95-09%) 08/02/69, nephrolithiasis complicated by hydronephrosis s/p stent placement in 2017, CVA, seizure disorder.  Bilateral pneumonia, COVID-19 PUI Patient presented from [facility] with fever 102.5.  Had been treated with Vanc and Zosyn for presumed UTI.  Continued to fever despite these antibiotics.  Upon arrival to ED, CXR suggestive of bilateral pneumonia.  Patient also noted to be tachycardic to 115 in A. Fib (chronically), BP soft at 104/62, lactic acid 2.1.  Code sepsis called.  WBC is elevated to 11.4, ANC 8.6, Lymphocytes WNL.  Blood Cx pending.  CCM consulted in ED and noted that they would manage her vent, but that she was stable for progressive bed given her current vent settings, although she was unclear of her normal vent settings at LTAC at the time.  BP improved to to 125/76 and HR to 105 following 1L NS in ED.  On exam, patient with coarse breath sounds bilaterally.  Given patient's fevers, underlying chronic respiratory failure, ventilator dependence, and bilateral pneumonia on CXR, highly suspicious for COVID-19.  Patient is also high risk for transmission given she is on ventilator.  Patient given vanc and meropenem in ED for possible UTI and pneumonia, will opt to continue at this time.  Discussed with pharmacy who recommended continuation for possible ESBL.  - Admit to progressive unit, Dr. Erin Hearing Service - vitals q4h  - continuous pulse ox - cardiac monitoring - cont vanc and meropenem per pharm (4/4- ) - f/u urine cx, blood cx - f/u COVID testing - AM CBC with diff, BMP - CRP, D-dimer, ferritin, troponin, and procalcitonin - flu and RVP - vent management per CCM - holding home prn duonebs due to aerosolization of virus and lack of likely benefit at this time - home PEG feedings - On Eliquis and patient at mobility baseline - social work consult for going back to [facility] - palliative consult for goals of care discussion - airborne precautions - PICC in place - trend LA  Possible UTI Patient has hx ESBL UTI 06/2017 that was sensitive to imipenem.  MRSA PCR nasal swab positive in Decemeber 2019, repeat in February 2020, negative.  Was being treated for presumed UTI at [facility] with Vancomycin and Zosyn, it is unclear how long.  Per ED report, patient had continued to fever and was brought to ED.  Daughter reported that she was told her urine "was growing something" at [place].  UA with small leukocytes, few bacteria, negative nitrites on admission.  Urine Cx, Blood Cx pending.  Started on vanc and meropenem in ED due to hx ESBL.  Patient did not flinch with palpation of her abdomen, but per daughter's report, she usually does not respond to someone pinching her arm.  Would recommend contacting [facility] in the AM to see what the culture that had obtained was growing.  This could also be asymptomatic bacteruria and fever is likely 2/2 to bilateral  pneumonia.  Will opt to continue antibiotics for now. - cont vanc, meropenem per pharmacy (4/4- ) - f/u urine Cx  AKI Creatinine up to 1.03 from 0.4.  Patient very dry on exam sodium also elevated to 151.  S/p 1 L NS in ED.  Given that patient is COVID PUI, would prefer to run her on the dry side. -Avoid nephrotoxic agents -BMP in  a.m. -NS at 120 cc/hr  Chronic Respiratory Failure, Tracheostomy and Vent-dependent Resides in LTAC.  Patient has been vent dependent since 2018.  Spoke with her daughter over the phone, who reports that she has been weaned off previously, but has been unable to wean off since December 2019.  Patient receives Valium every 12 hours.  CCM consulted in ED, will continue to consult on patient and manage vent settings. -Vent per CCM - Social Work consulted for LTAC placement on discharge as patient is from [facility]  PEG Dependent Patient receives pro-stat sugar-free 30 mL's twice daily, chlorhexidine wash twice daily and mouth, Pepcid 20 mg daily, multivitamin daily, Juven twice daily, continuous tube feeds, free water 200 mL's every 4 hours. -Continue all home feeds -Continue free water -Continue Pepcid daily  Seizure disorder Patient on Valium 5 mg every 12 hours, Keppra 1000 mg twice daily, valproate sodium 250 mg twice daily.  Per daughter, neurologic baseline is patient is able to open eyes, but otherwise does not interact or speak.  Patient does not open eyes on exam but when discussed with daughter she stated that this is somewhat her baseline. -Continue Valium, Keppra, valproate sodium  Atrial Fibrillation Patient in A. fib on examination.  HR 115, improved to 106 with 1 L NS.  Patient chronically anticoagulated on apixaban.  Takes Coreg at [employer]. -Hold Coreg in the setting of low BP -Continue apixaban -Continue maintenance IV fluids - cardiac monitoring  Chronic systolic CHF Last EF 45 to 50% on 06/28/2017.  Patient on Coreg and Lasix at home.  Patient dry on exam without evidence of fluid overload. -Hold Coreg and Lasix in the setting of low BP  Hypertension, presenting with hypotension On Coreg and Lasix at home.  Patient's BP 104/62 on presentation, improved to 125/76 with 1 L NS in ED. - Hold Coreg and Lasix in the setting of soft BP -- gentle fluid hydration given h/o CHF and  possible COVID diagnosis  CAD S/p stent 2011.  Patient on aspirin, apixaban. -Continue aspirin, apixaban  History of nephrolithiasis complicated by hydronephrosis S/p stent placement 2017.  Patient did not flinch during abdominal examination. -No intervention needed at this time, treating for presumed UTI  Multiple wounds Left heel wound noted on initial examination with bandage over.  Patient has a history of sacral ulcer infection in February 2019. -Wound care consult -Antibiotics per UTI  Goals of care Previous goals of care discussions have occurred with family per most recent admission notes.  Spoke with daughter, [name], by phone again today who reiterated that she would like for her mother to be full code and expects her to recover fully.  Given that the patient is trach-vent dependent and has had 3 admissions in the last 4 months, suspect that this is unlikely.  Would recommend that a more in-depth discussion be had with the daughter about goals of care for the patient. -Palliative consult for goals of care  FEN/GI: per feeding tube, PICC in place Prophylaxis: on Eliquis  Disposition: admit to progressive, COVID PUI  History of Present Illness:  Melissa Abbruzzese  Cochran is a 83 y.o. female presenting with fever follownig vanc and zosyn treatment at [facility] for presumed UTI.  [name] was called and she noted that she believes the antibiotics were started yesterday but she is unsure.  History is very limited as patient is non-verbal and [facility] representative not able to be in ED.  [name] notes that her mental baseline is that she is able to open her eyes, but has not been speaking or moving limbs since December 2019 hospitalization.  Daughter notes that patient originally placed on vent in 2018 but had been weaned off numerous times prior to December 2019.  Since that time, she has remained on vent and at baseline previously stated.   History somewhat unknown as  patient's daughter was unable to visit her due to recent [place] visitation restrictions. Per daughter patient began to spike a fever on Tuesday. At that time CXR and cultures were drawn and patient had "something" growing in her urine culture. She was diagnosed with pneumonia on 4/3 and started on antibiotics. Despite antibiotic treatment patient continued to have fevers >102 and had elevated HR with low BP. [name] also reports being told by RN that patient appeared to have trouble breathing. Nursing home physician recommended coming to ED.   Per ED report, no known COVID-19 cases at patient's nursing home and they are restricting visitors.  Patient has been admitted most recently in December 2019 and February 2020 for sepsis.   Patient unable to provide history and she is non-verbal.  Review Of Systems: ROS unable to be obtained as patient non-verbal    Patient Active Problem List   Diagnosis Date Noted  . Bilateral pneumonia 08/12/2018  . Acute respiratory failure with hypoxia ([place])   . CVA (cerebral vascular accident) ([place]) 04/19/2018  . Chronic respiratory failure ([place]) 04/19/2018  . Acute encephalopathy 04/18/2018  . E-coli UTI, ESBL  06/30/2017  . A-fib ([place]) 06/30/2017  . Need for protective airway ventilation 06/30/2017  . Seizures ([place]) 06/26/2017  . Acute renal failure ([place])   . Elevated troponin I level   . Acute renal failure with tubular necrosis ([place])   . Palliative care encounter   . Pressure injury of skin 02/17/2017  . Bilateral lower extremity edema 10/08/2016  . Venous ulcer of ankle ([place]) 07/08/2016  . DVT (deep venous thrombosis) ([place]) 07/02/2016  . Swelling of limb 06/08/2016  . Pain in limb 06/08/2016  . Altered mental status 01/22/2016  . S/P coronary artery stent placement 10/30/2015  . Preop cardiovascular exam 10/08/2015  . SOB (shortness of breath) on exertion 10/08/2015  . Malignant neoplasm of trigone of bladder ([place]) 09/29/2015  . Mass of  lower lobe of left lung, incidetnal on CT 09/2015, smoker.  09/29/2015  . Other disorders of lung 09/29/2015  . Other microscopic hematuria 08/26/2015  . Kidney stone 08/26/2015  . Urge incontinence 08/26/2015  . Pneumonia 01/24/2015  . PNA (pneumonia) 01/24/2015  . Essential (primary) hypertension 12/09/2014  . Adnexal mass 11/28/2013  . History of cardiac catheterization 11/28/2013  . Hyperlipidemia 11/28/2013  . Other specified postprocedural states 11/28/2013    Past Medical History: Past Medical History:  Diagnosis Date  . Adnexal mass   . Anxiety   . Arthritis   . Bladder tumor   . Bleeding ulcer   . CHF (congestive heart failure) ([place])   . CHF (congestive heart failure) ([place])   . Chronic bronchitis ([place])   . Chronic respiratory failure ([place]) 04/19/2018  .  COPD (chronic obstructive pulmonary disease) ([place])   . Coronary artery disease   . CVA (cerebral vascular accident) ([place]) 04/19/2018  . DVT (deep venous thrombosis) ([facility])    RLE  . GERD (gastroesophageal reflux disease)   . Heart block    left  . History of bleeding ulcers   . History of kidney stones   . Hyperlipemia   . Hypertension   . Myocardial infarction ([place])    "slight one" (07/05/2017)  . Pneumonia    "several times" (07/05/2017)  . Seizures ([place]) 02/2017 "several"; 06/26/2017 X 1  . Shortness of breath dyspnea   . Stroke [facility]) 02/13/2018   "still right sided weakness" (07/05/2017)    Past Surgical History: Past Surgical History:  Procedure Laterality Date  . ABDOMINAL HYSTERECTOMY    . CARDIAC CATHETERIZATION Left 10/21/2015   Procedure: Left Heart Cath and Coronary Angiography;  Surgeon: Isaias Cowman, MD;  Location: [place] [facility];  Service: Cardiovascular;  Laterality: Left;  . CARDIAC CATHETERIZATION N/A 10/21/2015   Procedure: Coronary Stent Intervention;  Surgeon: Isaias Cowman, MD;  Location: [place] [facility];  Service: Cardiovascular;  Laterality: N/A;  . CARDIAC  CATHETERIZATION    . CYSTOSCOPY W/ RETROGRADES Bilateral 01/20/2016   Procedure: CYSTOSCOPY WITH RETROGRADE PYELOGRAM;  Surgeon: Hollice Espy, MD;  Location: [facility] OR[facility];  Service: Urology;  Laterality: Bilateral;  . CYSTOSCOPY WITH STENT PLACEMENT Right 01/20/2016   Procedure: CYSTOSCOPY WITH STENT PLACEMENT;  Surgeon: Hollice Espy, MD;  Location: [facility] OR[facility];  Service: Urology;  Laterality: Right;  . HIP FRACTURE SURGERY Left   . STOMACH SURGERY     "stomach ulcers"  . TRACHEOSTOMY TUBE PLACEMENT N/A 03/02/2017   Procedure: TRACHEOSTOMY;  Surgeon: Rozetta Nunnery, MD;  Location: [place];  Service: ENT;  Laterality: N/A;  . TRANSURETHRAL RESECTION OF BLADDER TUMOR N/A 01/20/2016   Procedure: TRANSURETHRAL RESECTION OF BLADDER TUMOR (TURBT);  Surgeon: Hollice Espy, MD;  Location: [facility] OR[facility];  Service: Urology;  Laterality: N/A;    Social History: Social History   Tobacco Use  . Smoking status: Former Smoker    Packs/day: 1.00    Years: 40.00    Pack years: 40.00    Last attempt to quit: 06/13/1998    Years since quitting: 20.1  . Smokeless tobacco: Never Used  Substance Use Topics  . Alcohol use: No  . Drug use: No   Additional social history: Lives in [place]  Please also refer to relevant sections of EMR.  Family History: Family History  Problem Relation Age of Onset  . Stomach cancer Mother   . CVA Father   . Bladder Cancer Neg Hx   . Kidney cancer Neg Hx     Allergies and Medications: No Known Allergies No current facility-administered medications on file prior to encounter.    Current Outpatient Medications on File Prior to Encounter  Medication Sig Dispense Refill  . Amino Acids-Protein Hydrolys (FEEDING SUPPLEMENT, PRO-STAT SUGAR FREE 64,) LIQD Place 30 mLs into feeding tube 2 (two) times daily. 887 mL 0  . apixaban (ELIQUIS) 5 MG TABS tablet Place 1 tablet (5 mg total) into feeding tube 2 (two) times daily. 60 tablet   . carvedilol (COREG) 6.25 MG tablet  Place 1 tablet (6.25 mg total) into feeding tube 2 (two) times daily with a meal.    . chlorhexidine (PERIDEX) 0.12 % solution 5 mLs See admin instructions. 5 ml's for trach care 2 times a day    . diazepam (VALIUM) 5 MG  tablet Place 0.5 tablets (2.5 mg total) into feeding tube every 12 (twelve) hours. 30 tablet 0  . famotidine (PEPCID) 20 MG tablet Place 20 mg into feeding tube daily.     . furosemide (LASIX) 40 MG tablet Place 1 tablet (40 mg total) into feeding tube daily. (Patient taking differently: Place 40 mg into feeding tube 2 (two) times daily. ) 30 tablet   . HYDROcodone-acetaminophen (NORCO/VICODIN) 5-325 MG tablet Place 1 tablet into feeding tube every 8 (eight) hours.     Marland Kitchen ipratropium-albuterol (DUONEB) 0.5-2.5 (3) MG/3ML SOLN Take 3 mLs by nebulization as needed (for acute and chronic respiratory failure, unspecified whether with hypoxia or hypercapnia).     Marland Kitchen levETIRAcetam (KEPPRA) 100 MG/ML solution Place 10 mLs (1,000 mg total) into feeding tube 2 (two) times daily.    Marland Kitchen morphine (MSIR) 15 MG tablet Place 7.5 mg into feeding tube every 6 (six) hours as needed (for dyspnea).    . Multiple Vitamin (MULTIVITAMIN) tablet Place 1 tablet into feeding tube daily.    . nutrition supplement, JUVEN, (JUVEN) PACK Place 1 packet into feeding tube 2 (two) times daily between meals.  0  . Nutritional Supplements (ISOSOURCE 1.5 CAL) LIQD Place 70 mLs into feeding tube 2 (two) times daily.     . piperacillin-tazobactam (ZOSYN) 3.375 (3-0.375) g injection Inject 3.375 g into the vein every 8 (eight) hours. FOR 10 DAYS    . Sodium Chloride Flush (NORMAL SALINE FLUSH IV) Inject 10 mLs into the vein See admin instructions. Use 10 ml's via IV every shift for any used lumens- flush each unused lumen    . Valproate Sodium (DEPAKENE) 250 MG/5ML SOLN solution Place 5 mLs (250 mg total) into feeding tube 2 (two) times daily. 300 mL   . vancomycin 1,000 mg in sodium chloride 0.9 % 250 mL Inject 1,000 mg into  the vein daily. FOR 10 DAYS    . acetaminophen (TYLENOL) 160 MG/5ML solution Place 20.3 mLs (650 mg total) into feeding tube every 6 (six) hours as needed for mild pain, headache or fever. (Patient not taking: Reported on 08/12/2018) 120 mL 0  . aspirin 81 MG chewable tablet Place 1 tablet (81 mg total) into feeding tube daily. (Patient not taking: Reported on 08/12/2018)    . collagenase (SANTYL) ointment Apply topically daily. (Patient not taking: Reported on 08/12/2018) 15 g 0  . Multiple Vitamin (MULTIVITAMIN) LIQD Place 15 mLs into feeding tube daily. (Patient not taking: Reported on 08/12/2018)    . Nutritional Supplements (FEEDING SUPPLEMENT, VITAL AF 1.2 CAL,) LIQD Place 1,000 mLs into feeding tube continuous. (Patient not taking: Reported on 08/12/2018)    . Water For Irrigation, Sterile (FREE WATER) SOLN Place 200 mLs into feeding tube every 4 (four) hours. (Patient not taking: Reported on 08/12/2018)      Objective: BP (!) 92/55 (BP Location: Left Arm)   Pulse (!) 114   Temp (!) 100.5 F (38.1 C) (Axillary)   Resp (!) 21   Ht 5\' 4"  (1.626 m)   Wt 79.3 kg   SpO2 100%   BMI 30.00 kg/m   Physical Exam: General: 83 y.o. female, chronically ill-appearing HEENT: Dry MM, PERRL, does not open eyes on own  Cardio: irregularly irregular, 2+ radial pulses Lungs: trach in place on vent, coarse breath sounds bilaterally  Abdomen: Soft, non-distended, positive bowel sounds Skin: warm and dry Extremities: No edema, left>right heel pressure wound Neuro: not able to arouse to sternal rub but did respond to pain  in extremities  Psych: patient non-verbal   Labs and Imaging: CBC BMET  Recent Labs  Lab 08/12/18 1856 08/12/18 2059  WBC 11.4*  --   HGB 8.7* 16.3*  HCT 33.6* 48.0*  PLT 195  --    Recent Labs  Lab 08/12/18 1856 08/12/18 2059  NA 151* 158*  K 4.5 4.3  CL 111  --   CO2 30  --   BUN 92*  --   CREATININE 1.03*  --   GLUCOSE 155*  --   CALCIUM 9.5  --      Dg Chest Port 1  View  Result Date: 08/12/2018 CLINICAL DATA:  Fever.  Tracheostomy with ventilator dependence. EXAM: PORTABLE CHEST 1 VIEW COMPARISON:  06/13/2018 and multiple previous FINDINGS: Heart size is normal. Chronic aortic atherosclerosis. Chronic tracheostomy. Right arm PICC tip in the SVC the level of the carina. Background chronic abnormal interstitial lung markings. Acute hazy pneumonia in the mid and lower lungs bilaterally. Findings appear similar to the study of February, probably slightly worsened. IMPRESSION: Worsening of mid and lower lung infiltrates. Tracheostomy and right arm PICC appeared well positioned. Electronically Signed   By: Nelson Chimes M.D.   On: 08/12/2018 19:35    [name], DO PGY-1, [facility] pager: [phone], text pages welcome   FPTS Upper-Level Resident Addendum   I have independently interviewed and examined the patient. I have discussed the above with the original author and agree with their documentation. My edits for correction/addition/clarification are in blue. Please see also any attending notes.   Caroline More, DO PGY-2, [facility] 08/13/2018 12:38 AM  FPTS Service pager: [phone] (text pages welcome through [facility])

## 2018-08-12 NOTE — ED Notes (Signed)
Report attempted 

## 2018-08-12 NOTE — ED Notes (Signed)
Suzanne Boron [phone] pts daughter would like an update please

## 2018-08-12 NOTE — Consult Note (Signed)
..   NAME:  Melissa Cochran, MRN:  [id number], DOB:  04/02/1936, LOS: 0 ADMISSION DATE:  08/12/2018, CONSULTATION DATE: 08/11/17 REFERRING MD:  ED, CHIEF COMPLAINT: shortness of breath  Brief History   83 year old woman transferred from [place] with fever 102.5, hx of  chronic respiratory failure,  tracheostomy and ventilator and PEG dependent, atrial fibrillation on apixaban, HTN, CAD, s/p Stent 7510, Chronic systolic CHF EF 25-85% 2/77/82, nephrolithiasis complicated by hydronephrosis s/p stent placement in 2017, CVA, seizure disorder.  She had been started on vanc and zosyn for presumed UTI, fever persisted per ED note.    History of present illness   Transferred from [facility] with fever.   Past Medical History  chronic respiratory failure,  tracheostomy and ventilator and PEG dependent, atrial fibrillation on apixaban, HTN, CAD, s/p Stent 4235, Chronic systolic CHF EF 36-14% 4/31/54, nephrolithiasis complicated by hydronephrosis s/p stent placement in 2017, CVA, seizure disorder Baseline mental status: opens eyes spontaneously occasionally Hx sacral ulcer infection 06/2017 Recently admitted for fever on 2/19: possible tracheobronchitis vs PNA PICC in R arm   Significant Hospital Events   In ED.   Consults:    Procedures:    Significant Diagnostic Tests:  CXR: Heart size is normal. Chronic aortic atherosclerosis. Chronic tracheostomy. Right arm PICC tip in the SVC the level of the carina. Background chronic abnormal interstitial lung markings. Acute hazy pneumonia in the mid and lower lungs bilaterally. Findings appear similar to the study of February, probably slightly worsened.  IMPRESSION: Worsening of mid and lower lung infiltrates.  Micro Data:  Blood cultures 4/4 Urine cultures 4/4  Antimicrobials:    Interim history/subjective:    Objective   Blood pressure 125/76, pulse (!) 106, temperature (!) 102.5 F (39.2 C), temperature source Rectal, resp. rate 20,  height 5\' 4"  (1.626 m), weight 79.3 kg, SpO2 98 %.    Vent Mode: PCV FiO2 (%):  [50 %-60 %] 50 % Set Rate:  [18 bmp] 18 bmp PEEP:  [5 cmH20] 5 cmH20 Plateau Pressure:  [30 cmH20] 30 cmH20  No intake or output data in the 24 hours ending 08/12/18 2118   At time of my exam:  PEEP 6, Plateau pressure 28, Inspiratory pressure set 28, RR 18, FIO2 50%  Sat 100%  Patient rr 24  Filed Weights   08/12/18 1817  Weight: 79.3 kg    Examination: General: NAD, minimal grimace to painful stimuli, no response to voice, minimal withdrawal in UE to painful stimuli HENT: eyes closed, pupils equal and round, mm dry, trach site c/d/i (shiley 8 SCT) Lungs: Rhonchi bilaterally Cardiovascular: tachy, no audible murmur or rub Abdomen: nt, nd, minimal bowel sounds, PEG in place Extremities: wound dressing on the L heel, wasting musculature in limbs  Neuro: only minimal grimace to painful stimuli, non responsive to voice, follows no commands GU: foley catheter in place.    Resolved Hospital Problem list     Assessment & Plan:  83 year old woman with fever, tachycardia, chronic ventilator dependence for ???  Acute on chronic hypoxic Respiratory failure: Bacterial vs viral PNA.  Presented similarly in 2/20 with MDR acinetobacter, tx with meropenem.   Agree with starting meropenem now.   Consider sputum culture.    Patient is close to her baseline vent settings.  PH 7.443/54.6/100 Vent settings during prior admission 06/21/18 Vent Mode: PRVC, FIO2 30%, RR 12, Vt 500, PEEP 5, Plateau pressure 28.  Currently on PAC FiO2 50%, RR 18, PEEP 6, Insp  Pressure 28, VT 450 patient rr 18-24.     ABG shows likely compensated chronic respiratory acidosis with acute hypoxia.  PaO2/FIO2 108  Recent admission 2/17 with MDR acinetobacter, tx with meropenem.    RECS: OK to continue current vent settings, per report she did better on current settings as compared to [facility]. Recommend CRP, D-dimer, ferritin, troponin, and  procalcitonin level,  Influenza swab.  Continue to check CBC with dif.   Hypotension, resolved:  Sinus tach.  Single recorded bp 94/55, most recent 101/60. Appears to need fluid resuscitation, however proceed cautiously given possible COVID 19.  Please call us if she becomes hypotensive again and requires pressors.    Fever: PNA (less likely UTI). UA with minimal squames, 21-50 wbc, leuks not reported, nitrite neg.   Hypernatremia AKI : Cr 1.03 with baseline 0.4 Hypoalbuminemia.   Seizure disorder: on levetiracetam1000mg twice daily by tube, valproic acid 250 mg twice daily by tube At prior admission.   PEG tube dependence  Chronic atrial fibrillation complicated by CVA: on beta blocker and apixaban at last admission.    CAD status post stent -aspirin 81 mg  Hypertension -on carvedilol 6.25 mg twice daily. Held  Hyperlipidemia  Nephrolithiasis / hydronephrosis status post stents -creatinine is normal  Chronic systolic heart failure -TTEFebruary showed EF of 40 to 45% -Continue furosemide 40 mg daily   GOC: recommend palliative care consult. Consulted last admission, daughter wanted to continue full aggressive care at that time.  Would be excellent candidate for comfort care.   Best practice:  Diet: Pain/Anxiety/Delirium protocol (if indicated):  VAP protocol (if indicated):  DVT prophylaxis:  GI prophylaxis:  Glucose control:  Mobility:  Code Status:  Family Communication:  Disposition:   Labs   CBC: Recent Labs  Lab 08/12/18 1856 08/12/18 2059  WBC 11.4*  --   NEUTROABS 8.6*  --   HGB 8.7* 16.3*  HCT 33.6* 48.0*  MCV 93.3  --   PLT 195  --     Basic Metabolic Panel: Recent Labs  Lab 08/12/18 1856 08/12/18 2059  NA 151* 158*  K 4.5 4.3  CL 111  --   CO2 30  --   GLUCOSE 155*  --   BUN 92*  --   CREATININE 1.03*  --   CALCIUM 9.5  --    GFR: Estimated Creatinine Clearance: 43.6 mL/min (A) (by C-G formula based on SCr of 1.03  mg/dL (H)). Recent Labs  Lab 08/12/18 1856 08/12/18 2037  WBC 11.4*  --   LATICACIDVEN 2.1* 2.2*    Liver Function Tests: Recent Labs  Lab 08/12/18 1856  AST 17  ALT 18  ALKPHOS 63  BILITOT 0.9  PROT 6.4*  ALBUMIN 1.9*   No results for input(s): LIPASE, AMYLASE in the last 168 hours. No results for input(s): AMMONIA in the last 168 hours.  ABG    Component Value Date/Time   PHART 7.443 08/12/2018 2059   PCO2ART 54.6 (H) 08/12/2018 2059   PO2ART 100.0 08/12/2018 2059   HCO3 36.6 (H) 08/12/2018 2059   TCO2 38 (H) 08/12/2018 2059   ACIDBASEDEF 2.0 06/26/2017 0210   O2SAT 97.0 08/12/2018 2059     Coagulation Profile: No results for input(s): INR, PROTIME in the last 168 hours.  Cardiac Enzymes: No results for input(s): CKTOTAL, CKMB, CKMBINDEX, TROPONINI in the last 168 hours.  HbA1C: Hgb A1c MFr Bld  Date/Time Value Ref Range Status  02/13/2017 09:35 PM 5.1 4.8 - 5.6 % Final  Comment:    (NOTE) Pre diabetes:          5.7%-6.4% Diabetes:              >6.4% Glycemic control for   <7.0% adults with diabetes     CBG: No results for input(s): GLUCAP in the last 168 hours.  Review of Systems:   Unable to assess due to altered mental status  Past Medical History  She,  has a past medical history of Adnexal mass, Anxiety, Arthritis, Bladder tumor, Bleeding ulcer, CHF (congestive heart failure) ([place]), CHF (congestive heart failure) ([place]), Chronic bronchitis ([place]), Chronic respiratory failure ([place]) (04/19/2018), COPD (chronic obstructive pulmonary disease) ([place]), Coronary artery disease, CVA (cerebral vascular accident) ([place]) (04/19/2018), DVT (deep venous thrombosis) ([place]), GERD (gastroesophageal reflux disease), Heart block, History of bleeding ulcers, History of kidney stones, Hyperlipemia, Hypertension, Myocardial infarction ([place]), Pneumonia, Seizures ([place]) (02/2017 "several"; 06/26/2017 X 1), Shortness of breath dyspnea, and Stroke ([place]) (02/13/2018).    Surgical History    Past Surgical History:  Procedure Laterality Date  . ABDOMINAL HYSTERECTOMY    . CARDIAC CATHETERIZATION Left 10/21/2015   Procedure: Left Heart Cath and Coronary Angiography;  Surgeon: Isaias Cowman, MD;  Location: [place] [facility];  Service: Cardiovascular;  Laterality: Left;  . CARDIAC CATHETERIZATION N/A 10/21/2015   Procedure: Coronary Stent Intervention;  Surgeon: Isaias Cowman, MD;  Location: [place] [facility];  Service: Cardiovascular;  Laterality: N/A;  . CARDIAC CATHETERIZATION    . CYSTOSCOPY W/ RETROGRADES Bilateral 01/20/2016   Procedure: CYSTOSCOPY WITH RETROGRADE PYELOGRAM;  Surgeon: Hollice Espy, MD;  Location: [facility];  Service: Urology;  Laterality: Bilateral;  . CYSTOSCOPY WITH STENT PLACEMENT Right 01/20/2016   Procedure: CYSTOSCOPY WITH STENT PLACEMENT;  Surgeon: Hollice Espy, MD;  Location: [facility];  Service: Urology;  Laterality: Right;  . HIP FRACTURE SURGERY Left   . STOMACH SURGERY     "stomach ulcers"  . TRACHEOSTOMY TUBE PLACEMENT N/A 03/02/2017   Procedure: TRACHEOSTOMY;  Surgeon: Rozetta Nunnery, MD;  Location: [place];  Service: ENT;  Laterality: N/A;  . TRANSURETHRAL RESECTION OF BLADDER TUMOR N/A 01/20/2016   Procedure: TRANSURETHRAL RESECTION OF BLADDER TUMOR (TURBT);  Surgeon: Hollice Espy, MD;  Location: [facility];  Service: Urology;  Laterality: N/A;     Social History   reports that she quit smoking about 20 years ago. She has a 40.00 pack-year smoking history. She has never used smokeless tobacco. She reports that she does not drink alcohol or use drugs.   Family History   Her family history includes CVA in her father; Stomach cancer in her mother. There is no history of Bladder Cancer or Kidney cancer.   Allergies No Known Allergies   Home Medications  Prior to Admission medications   Medication Sig Start Date End Date Taking? Authorizing Provider  acetaminophen (TYLENOL) 160 MG/5ML solution Place  20.3 mLs (650 mg total) into feeding tube every 6 (six) hours as needed for mild pain, headache or fever. 06/19/18   Cherene Altes, MD  Amino Acids-Protein Hydrolys (FEEDING SUPPLEMENT, PRO-STAT SUGAR FREE 64,) LIQD Place 30 mLs into feeding tube 2 (two) times daily. 06/26/18   Cherene Altes, MD  apixaban (ELIQUIS) 5 MG TABS tablet Place 1 tablet (5 mg total) into feeding tube 2 (two) times daily. 06/26/18   Cherene Altes, MD  aspirin 81 MG chewable tablet Place 1 tablet (81 mg total) into feeding tube daily. 06/19/18   Cherene Altes, MD  carvedilol (COREG)  6.25 MG tablet Place 1 tablet (6.25 mg total) into feeding tube 2 (two) times daily with a meal. 06/19/18   Cherene Altes, MD  chlorhexidine (PERIDEX) 0.12 % solution Use as directed 15 mLs in the mouth or throat 2 (two) times daily.    [provider]  collagenase (SANTYL) ointment Apply topically daily. 06/18/18   Georgette Shell, MD  diazepam (VALIUM) 5 MG tablet Place 0.5 tablets (2.5 mg total) into feeding tube every 12 (twelve) hours. 06/19/18   Cherene Altes, MD  famotidine (PEPCID) 20 MG tablet Place 20 mg into feeding tube daily.     [provider]  furosemide (LASIX) 40 MG tablet Place 1 tablet (40 mg total) into feeding tube daily. 06/19/18   Cherene Altes, MD  HYDROcodone-acetaminophen (NORCO/VICODIN) 5-325 MG tablet Place 1 tablet into feeding tube every 8 (eight) hours as needed for moderate pain.     [provider]  ipratropium-albuterol (DUONEB) 0.5-2.5 (3) MG/3ML SOLN Take 3 mLs by nebulization every 6 (six) hours as needed (SOB).    [provider]  levETIRAcetam (KEPPRA) 100 MG/ML solution Place 10 mLs (1,000 mg total) into feeding tube 2 (two) times daily. 06/19/18   Cherene Altes, MD  Multiple Vitamin (MULTIVITAMIN) LIQD Place 15 mLs into feeding tube daily. 06/19/18   Cherene Altes, MD  nutrition supplement, JUVEN, Fanny Dance) PACK Place 1 packet into  feeding tube 2 (two) times daily between meals. 06/19/18   Cherene Altes, MD  Nutritional Supplements (FEEDING SUPPLEMENT, VITAL AF 1.2 CAL,) LIQD Place 1,000 mLs into feeding tube continuous. 06/26/18   Cherene Altes, MD  Valproate Sodium (DEPAKENE) 250 MG/5ML SOLN solution Place 5 mLs (250 mg total) into feeding tube 2 (two) times daily. 06/19/18   Cherene Altes, MD  Water For Irrigation, Sterile (FREE WATER) SOLN Place 200 mLs into feeding tube every 4 (four) hours. 06/17/18   Georgette Shell, MD     Critical care time: 60 min

## 2018-08-13 ENCOUNTER — Inpatient Hospital Stay (HOSPITAL_COMMUNITY): Payer: Medicare Other

## 2018-08-13 DIAGNOSIS — Z7189 Other specified counseling: Secondary | ICD-10-CM

## 2018-08-13 DIAGNOSIS — R6520 Severe sepsis without septic shock: Secondary | ICD-10-CM

## 2018-08-13 DIAGNOSIS — A419 Sepsis, unspecified organism: Secondary | ICD-10-CM

## 2018-08-13 DIAGNOSIS — Z515 Encounter for palliative care: Secondary | ICD-10-CM

## 2018-08-13 DIAGNOSIS — J181 Lobar pneumonia, unspecified organism: Secondary | ICD-10-CM

## 2018-08-13 LAB — URINE CULTURE: Culture: NO GROWTH

## 2018-08-13 LAB — CBC WITH DIFFERENTIAL/PLATELET
Abs Immature Granulocytes: 0.1 10*3/uL — ABNORMAL HIGH (ref 0.00–0.07)
Basophils Absolute: 0.1 10*3/uL (ref 0.0–0.1)
Basophils Relative: 1 %
Eosinophils Absolute: 0.1 10*3/uL (ref 0.0–0.5)
Eosinophils Relative: 1 %
HCT: 31.1 % — ABNORMAL LOW (ref 36.0–46.0)
Hemoglobin: 8.1 g/dL — ABNORMAL LOW (ref 12.0–15.0)
Lymphocytes Relative: 18 %
Lymphs Abs: 2 10*3/uL (ref 0.7–4.0)
MCH: 24 pg — ABNORMAL LOW (ref 26.0–34.0)
MCHC: 26 g/dL — ABNORMAL LOW (ref 30.0–36.0)
MCV: 92.3 fL (ref 80.0–100.0)
Monocytes Absolute: 0.1 10*3/uL (ref 0.1–1.0)
Monocytes Relative: 1 %
Myelocytes: 1 %
Neutro Abs: 8.7 10*3/uL — ABNORMAL HIGH (ref 1.7–7.7)
Neutrophils Relative %: 78 %
Platelets: 186 10*3/uL (ref 150–400)
RBC: 3.37 MIL/uL — ABNORMAL LOW (ref 3.87–5.11)
RDW: 17.3 % — ABNORMAL HIGH (ref 11.5–15.5)
WBC: 11.1 10*3/uL — ABNORMAL HIGH (ref 4.0–10.5)
nRBC: 0 /100 WBC
nRBC: 0.3 % — ABNORMAL HIGH (ref 0.0–0.2)

## 2018-08-13 LAB — POCT I-STAT 7, (LYTES, BLD GAS, ICA,H+H)
Acid-Base Excess: 11 mmol/L — ABNORMAL HIGH (ref 0.0–2.0)
Bicarbonate: 36.6 mmol/L — ABNORMAL HIGH (ref 20.0–28.0)
Calcium, Ion: 1.38 mmol/L (ref 1.15–1.40)
HCT: 48 % — ABNORMAL HIGH (ref 36.0–46.0)
Hemoglobin: 16.3 g/dL — ABNORMAL HIGH (ref 12.0–15.0)
O2 Saturation: 97 %
Patient temperature: 102.5
Potassium: 4.3 mmol/L (ref 3.5–5.1)
Sodium: 158 mmol/L — ABNORMAL HIGH (ref 135–145)
TCO2: 38 mmol/L — ABNORMAL HIGH (ref 22–32)
pCO2 arterial: 54.6 mmHg — ABNORMAL HIGH (ref 32.0–48.0)
pH, Arterial: 7.443 (ref 7.350–7.450)
pO2, Arterial: 100 mmHg (ref 83.0–108.0)

## 2018-08-13 LAB — TROPONIN I
Troponin I: 0.1 ng/mL (ref <0.03)
Troponin I: 0.1 ng/mL (ref <0.03)
Troponin I: 0.09 ng/mL (ref <0.03)

## 2018-08-13 LAB — RESPIRATORY PANEL BY PCR

## 2018-08-13 LAB — PROCALCITONIN
Procalcitonin: 2.91 ng/mL
Procalcitonin: 2.76 ng/mL

## 2018-08-13 LAB — BASIC METABOLIC PANEL
Anion gap: 8 (ref 5–15)
BUN: 89 mg/dL — ABNORMAL HIGH (ref 8–23)
CO2: 34 mmol/L — ABNORMAL HIGH (ref 22–32)
Calcium: 9.8 mg/dL (ref 8.9–10.3)
Chloride: 115 mmol/L — ABNORMAL HIGH (ref 98–111)
Creatinine, Ser: 1.04 mg/dL — ABNORMAL HIGH (ref 0.44–1.00)
GFR calc Af Amer: 58 mL/min — ABNORMAL LOW (ref >60)
GFR calc non Af Amer: 50 mL/min — ABNORMAL LOW (ref >60)
Glucose, Bld: 111 mg/dL — ABNORMAL HIGH (ref 70–99)
Potassium: 4 mmol/L (ref 3.5–5.1)
Sodium: 157 mmol/L — ABNORMAL HIGH (ref 135–145)

## 2018-08-13 LAB — FERRITIN: Ferritin: 142 ng/mL (ref 11–307)

## 2018-08-13 LAB — D-DIMER, QUANTITATIVE: D-Dimer, Quant: 5.1 ug/mL-FEU — ABNORMAL HIGH (ref 0.00–0.50)

## 2018-08-13 LAB — C-REACTIVE PROTEIN: CRP: 41.7 mg/dL — ABNORMAL HIGH (ref <1.0)

## 2018-08-13 LAB — VANCOMYCIN, RANDOM: Vancomycin Rm: 26

## 2018-08-13 LAB — MRSA PCR SCREENING: MRSA by PCR: NEGATIVE

## 2018-08-13 LAB — INFLUENZA PANEL BY PCR (TYPE A & B)
Influenza A By PCR: NEGATIVE
Influenza B By PCR: NEGATIVE

## 2018-08-13 LAB — LACTIC ACID, PLASMA: Lactic Acid, Venous: 1.9 mmol/L (ref 0.5–1.9)

## 2018-08-13 MED ORDER — ALTEPLASE 2 MG IJ SOLR
2.0000 mg | Freq: Once | INTRAMUSCULAR | Status: AC
  Administered 2018-08-13: 2 mg

## 2018-08-13 MED ORDER — SODIUM CHLORIDE 0.9% FLUSH
10.0000 mL | Freq: Two times a day (BID) | INTRAVENOUS | Status: DC
  Administered 2018-08-13 – 2018-08-22 (×17): 10 mL
  Administered 2018-08-23: 11:00:00 30 mL
  Administered 2018-08-24 – 2018-08-25 (×3): 10 mL

## 2018-08-13 MED ORDER — ACETAMINOPHEN 650 MG RE SUPP
650.0000 mg | Freq: Four times a day (QID) | RECTAL | Status: DC | PRN
  Administered 2018-08-13: 650 mg via RECTAL
  Filled 2018-08-13: qty 1

## 2018-08-13 MED ORDER — HYDROCODONE-ACETAMINOPHEN 5-325 MG PO TABS
1.0000 | ORAL_TABLET | Freq: Three times a day (TID) | ORAL | Status: DC
  Administered 2018-08-13 – 2018-08-25 (×36): 1
  Filled 2018-08-13 (×37): qty 1

## 2018-08-13 MED ORDER — CHLORHEXIDINE GLUCONATE CLOTH 2 % EX PADS
6.0000 | MEDICATED_PAD | Freq: Every day | CUTANEOUS | Status: DC
  Administered 2018-08-13 – 2018-08-25 (×13): 6 via TOPICAL

## 2018-08-13 MED ORDER — HYDROCODONE-ACETAMINOPHEN 5-325 MG PO TABS: 1.0000 | ORAL_TABLET | Freq: Three times a day (TID) | ORAL | Status: DC | PRN

## 2018-08-13 MED ORDER — SODIUM CHLORIDE 0.9 % IV BOLUS
250.0000 mL | Freq: Once | INTRAVENOUS | Status: AC
  Administered 2018-08-13: 250 mL via INTRAVENOUS

## 2018-08-13 MED ORDER — SODIUM CHLORIDE 0.9 % IV BOLUS
1000.0000 mL | Freq: Once | INTRAVENOUS | Status: AC
  Administered 2018-08-13: 1000 mL via INTRAVENOUS

## 2018-08-13 MED ORDER — SODIUM CHLORIDE 0.9 % IV SOLN
INTRAVENOUS | Status: DC
  Administered 2018-08-13 – 2018-08-15 (×4): via INTRAVENOUS

## 2018-08-13 MED ORDER — CARVEDILOL 6.25 MG PO TABS
6.2500 mg | ORAL_TABLET | Freq: Two times a day (BID) | ORAL | Status: DC
  Filled 2018-08-13 (×2): qty 1

## 2018-08-13 MED ORDER — VANCOMYCIN HCL 1000 MG IV SOLR
1000.0000 mg | INTRAVENOUS | Status: DC
  Administered 2018-08-14 – 2018-08-15 (×2): 1000 mg via INTRAVENOUS
  Filled 2018-08-13 (×3): qty 1000

## 2018-08-13 MED ORDER — SODIUM CHLORIDE 0.9% FLUSH: 10.0000 mL | INTRAVENOUS | Status: DC | PRN

## 2018-08-13 NOTE — Progress Notes (Signed)
Pharmacy Antibiotic Note  Melissa Cochran is a 83 y.o. female admitted on 08/12/2018 with HCAP.  From [facility] with temp of 102.5. Was on vancomycin/zosyn at [facility] (UCx E Coli - S zosyn). CXR showing bilateral infiltrates- trach cx growing GNR/GPR at [facility]. Recent trach aspirate on 06/13/2018 for Acinetobacter (S imipenem). Scr 1.03 (CrCl 43 mL/min) - previously Scr were 0.3-0.4. WBC 11.4, LA 2.1.    Last dose of vancomycin 1 g IV q12 hours was on 4/4@0900 .  Vancomycin random levels remain slightly supratherapeutic 31 > 26.  Plan: Vancomycin 1g/24h Meropenem 1 g IV every 12 hours Monitor renal fx, clinical pic, cx results Will check vancomycin levels as needed   Recent Labs  Lab 08/12/18 1856 08/12/18 2037 08/13/18 0051 08/13/18 0259  WBC 11.4*  --   --  11.1*  CREATININE 1.03*  --   --  1.04*  LATICACIDVEN 2.1* 2.2*  --  1.9  VANCOTROUGH  --  31*  --   --   VANCORANDOM  --   --  26  --       Antimicrobials this admission: Meropenem 4/4 >>  Vancomycin 4/4>>  Dose adjustments this admission: N/A  Microbiology results: 4/4 BCx: ip 4/4 UCx: ip   Harvel Quale 08/13/2018 11:03 AM

## 2018-08-13 NOTE — Progress Notes (Addendum)
..   NAME:  Melissa Cochran, MRN:  [id number], DOB:  21-Dec-1935, LOS: 1 ADMISSION DATE:  08/12/2018, CONSULTATION DATE: 08/11/17 REFERRING MD:  ED, CHIEF COMPLAINT: shortness of breath  Brief History   83 year old woman transferred from [place] with fever 102.5, hx of  chronic respiratory failure,  tracheostomy and ventilator and PEG dependent, atrial fibrillation on apixaban, HTN, CAD, s/p Stent 1308, Chronic systolic CHF EF 65-78% 4/69/62, nephrolithiasis complicated by hydronephrosis s/p stent placement in 2017, CVA, seizure disorder.  She had been started on vanc and zosyn for presumed UTI, fever persisted per ED note.    History of present illness   Transferred from [facility] with fever.  Hemodynamically stable  Past Medical History  chronic respiratory failure,  tracheostomy and ventilator and PEG dependent, atrial fibrillation on apixaban, HTN, CAD, s/p Stent 9528, Chronic systolic CHF EF 41-32% 4/40/10, nephrolithiasis complicated by hydronephrosis s/p stent placement in 2017, CVA, seizure disorder Baseline mental status: opens eyes spontaneously occasionally Hx sacral ulcer infection 06/2017 Recently admitted for fever on 2/19: possible tracheobronchitis vs PNA PICC in R arm   Significant Hospital Events   In ED.   Consults:    Procedures:    Significant Diagnostic Tests:  08/13/2018 no chest x-ray  Micro Data:  Blood cultures 4/4 Urine cultures 4/4  Antimicrobials:  08/13/2018 meropenem 09/2018 vancomycin  Interim history/subjective:    Objective   Blood pressure 94/62, pulse (!) 106, temperature (!) 101.6 F (38.7 C), temperature source Oral, resp. rate (!) 21, height 5\' 4"  (1.626 m), weight 79.3 kg, SpO2 98 %.    Vent Mode: PCV FiO2 (%):  [40 %-60 %] 40 % Set Rate:  [18 bmp-21 bmp] 21 bmp PEEP:  [5 cmH20] 5 cmH20 Pressure Support:  [5 cmH20] 5 cmH20 Plateau Pressure:  [25 cmH20-30 cmH20] 25 cmH20   Intake/Output Summary (Last 24 hours) at 08/13/2018 1022 Last  data filed at 08/13/2018 0900 Gross per 24 hour  Intake 1061.89 ml  Output 820 ml  Net 241.89 ml      Filed Weights   08/12/18 1817  Weight: 79.3 kg    Examination: General: Pathetic appearing female, chronic trach and vent HEENT: Chronic trach in place Neuro: Does not respond to verbal stimuli.  Grunts and coughs to noxious stimuli CV: Sounds are good PULM: even/non-labored, lungs bilaterally and base UV:OZDG, non-tender, bsx4 active  Extremities: warm/dry, 1+ edema  Skin: no rashes or lesions    Resolved Hospital Problem list     Assessment & Plan:  83 year old woman with fever, tachycardia, chronic ventilator dependence for ???  Acute on chronic hypoxic Respiratory failure: Chronic trach dependent comes from facility were nosocomial infections with a normal  Continue current vent settings Antimicrobials Follow culture data Pulmonary critical care will be available PRN for vent management  Hypotension, resolved:  Sinus tach.  Resolved  Fever: PNA presumed Continue antimicrobial therapy monitor culture data  Hypernatremia AKI : Cr 1.03 with baseline 0.4 Hypoalbuminemia.  Per primary  Seizure disorder: on levetiracetam1000mg twice daily by tube, valproic acid 250 mg twice daily by tube At prior admission.  Per primary  PEG tube dependence Primary  Chronic atrial fibrillation complicated by CVA: on beta blocker and apixaban at last admission.    Per primary   CAD status post stent ASA 81 mg  Hypertension Per primary  Hyperlipidemia  Nephrolithiasis / hydronephrosis status post stents Primary  Chronic systolic heart failure Continue diuresis per primary   GOC: recommend palliative care  consult. Consulted last admission, daughter wanted to continue full aggressive care at that time.  Would be excellent candidate for comfort care.  08/13/2018 daughter again called again was full code.  Best practice:  Diet: Per primary  Pain/Anxiety/Delirium protocol (if indicated): Primary VAP protocol (if indicated): Chronic trach DVT prophylaxis: Per primary GI prophylaxis: Per primary Glucose control: Per primary Mobility: Nonmobile Code Status: Full code per daughter's request Family Communication: Called multiple times multiple people she has confirmed full code Disposition: Currently in the intensive care unit rule out code hemodynamically stable she could directly go to the stepdown unit with appropriate precautions  Labs   CBC: Recent Labs  Lab 08/12/18 1856 08/12/18 2059 08/13/18 0259  WBC 11.4*  --  11.1*  NEUTROABS 8.6*  --  8.7*  HGB 8.7* 16.3* 8.1*  HCT 33.6* 48.0* 31.1*  MCV 93.3  --  92.3  PLT 195  --  379    Basic Metabolic Panel: Recent Labs  Lab 08/12/18 1856 08/12/18 2059 08/13/18 0259  NA 151* 158* 157*  K 4.5 4.3 4.0  CL 111  --  115*  CO2 30  --  34*  GLUCOSE 155*  --  111*  BUN 92*  --  89*  CREATININE 1.03*  --  1.04*  CALCIUM 9.5  --  9.8   GFR: Estimated Creatinine Clearance: 43.2 mL/min (A) (by C-G formula based on SCr of 1.04 mg/dL (H)). Recent Labs  Lab 08/12/18 1856 08/12/18 2037 08/13/18 0051 08/13/18 0259  PROCALCITON  --   --  2.91 2.76  WBC 11.4*  --   --  11.1*  LATICACIDVEN 2.1* 2.2*  --  1.9    Liver Function Tests: Recent Labs  Lab 08/12/18 1856  AST 17  ALT 18  ALKPHOS 63  BILITOT 0.9  PROT 6.4*  ALBUMIN 1.9*   No results for input(s): LIPASE, AMYLASE in the last 168 hours. No results for input(s): AMMONIA in the last 168 hours.  ABG    Component Value Date/Time   PHART 7.443 08/12/2018 2059   PCO2ART 54.6 (H) 08/12/2018 2059   PO2ART 100.0 08/12/2018 2059   HCO3 36.6 (H) 08/12/2018 2059   TCO2 38 (H) 08/12/2018 2059   ACIDBASEDEF 2.0 06/26/2017 0210   O2SAT 97.0 08/12/2018 2059     Coagulation Profile: No results for input(s): INR, PROTIME in the last 168 hours.  Cardiac Enzymes: Recent Labs  Lab 08/13/18 0051 08/13/18  0259  TROPONINI 0.10* 0.10*    HbA1C: Hgb A1c MFr Bld  Date/Time Value Ref Range Status  02/13/2017 09:35 PM 5.1 4.8 - 5.6 % Final    Comment:    (NOTE) Pre diabetes:          5.7%-6.4% Diabetes:              >6.4% Glycemic control for   <7.0% adults with diabetes     CBG: No results for input(s): GLUCAP in the last 168 hours.   Attending Note:  Case, chart and films reviewed, assessment and plan discussed with Mr Minor. Agree with assessment and plan. Dr Laurelyn Sickle  MD      Critical care time: 20 min      Richardson Landry Minor ACNP [facility] PCCM Pager [phone] till 1 pm If no answer page [phone] 08/13/2018, 10:23 AM

## 2018-08-13 NOTE — Progress Notes (Addendum)
Family Medicine Teaching Service Daily Progress Note Intern Pager: [phone]  Patient name: Melissa Cochran Medical record number: [id number] Date of birth: 04/30/1936 Age: 83 y.o. Gender: female  Primary Care Provider: [name], MD Consultants: Pharmacy, CCM, SW, Palliative, dietitian  Code Status: Full Code   Pt Overview and Major Events to Date:  Admitted: 08/12/2018 for CC: Fever  Hospital Day: 2   Assessment and Plan: Melissa Cochran is a 83 y.o. female present from [place] with fever. S/p vanc and zosyn for presumed UTI. admitted for bilateral pneumonia.Patient's presentation is concerning for COVID 19.  Her chronic conditions include tach/vent dependent choric respiratory failure, PEG dependent, a fib on apixaban, HTN, CAD (s/p stent in 2011), HFrEF (45-50%), nephrolithiasis c/b hydronephrosis s/p stent placement (2017), CVA, and seizure disorder.   Ancillary information  Per nurse, at [facility] Fever started on Friday, April 3rd and treated with vanc/zosyn for presumed UTI. T 101.7 and went down to 99.9 on Friday. VSS. Fever restarted around 2 PM yesterday and was 100.8. At 5PM 102.3 w/ BP of 92/54. Her BP is always low. 90-110/60-80 - holding recently. Closed facility to visitors 3-4 weeks ago. No COVID patients there. Patient has chronic indwelling foley - which is changed PRN if cloudy or smells. Nurse is unable to tell me the last time the foley was changed.   #Fever Fever to 102.5 overnight. Patient continues to be febrile this morning.  Patient has many sources of infection. She was found to have B/L pna on CXR in the ED, is trach vent dependent, and also has multiple stage 4 ulcers. Additionally, patient has several access lines, including PICC and chronic indwelling foley. UA returned with few bacteria and 21-50 WBC hpf. Lactic acid trended to 2.1>2.2>1.9. White count 11.1 today (ANC 8.7), down from 11.4 8 hours later. CRP, 41.7. Procalcitonin 2.76, D Dimer 5.10. Flu  negative, RVP and COVID (collected at 4/4 2000) pending. CXR with bilateral middle and lower lobe opacities. Urine Culture collected 4/4 @1900 . Blood cultures collected x 2.  MRSA negative (previously positive in 04/2018). Urine culture and blood cultures remain negative. Previous trach aspirate culture  in February positive for Acinetobacter calcoaceticus/baumannii complex sensitive to amp/sulbactam, gentamicin, and imipenem. Code sepsis called on patient yesterday. It is unclear if tachycardia 2/2 being volume down vs SIRS response. Same with hypotension, as patient typically hypotensive to 96-283 systolic.  Follow up blood and urine cultures   Continue vanc and meropenam per pharmacy   Can consider adding on gentamicin if patient continues to fever   Repeat CBC in AM  F/u COVID labs  Airborne precautions   Remove PICC line for holiday   PIV placed   #Hypertroponinemia  0.10 > 0.10. Initial EKG with age indeterminant anterior infarct and pt with history of CAD, otherwise, no other changes. Patient is unable to report any chest pain. Will repeat troponin x 1. Likely due to demand ischemia in setting of tachycardia and sepsis.   Repeat troponin   # Ventilator Associated Bilateral Pneumonia  Patient has MDR risk factors including IV antibiotic use within the previous 90 days.  Per recommendations, patient was should receive 2 agents with Pseudomonas activity and 1 agent active against MRSA. Currently on vancomycin and meropenem.  Can consider adding fluoroquinolone or aminoglycoside for gram-negative bacilli.    Consider gentamicin if patient continues to fever  Continue Vanc and meropenam  # Pressure ulcers, multiple  Stage 4, unstageable  Wound care consult  Antibiotics as above  #  Possible UTI versus asymptomatic bacteriuria UA with few bacteria and 21-50 WBC.  Per daughter, culture started at [place].   Follow-up urine culture  Continue Vanco and meropenem per pharmacy  (4/4- )  # Chronic Respiratory Failure  Most recent ABG with New trach placed yesterday evening   Vent management per CCM   #Hypernatremia Sodium 157. Received NS 120 mL/hr started at 2300 yesterday evening.   1 L bolus this morning  Increase free water with feeds   AM BMP   # AKI Creatinine 1.04 up for baseline ~0.35. Was on 120 cc/hr maintenance fluids overnight.   Repeat BMP in the a.m.  DC mIVF, give 1 L bolus   Consider adding back mIVF pending Na and Cr in the morning.   # PEG, Tube Feeds  Rectal tube & gastrostomy. Prostate sugar free 30 mL BID, oral care, pecid 20mg  daily, MVI, Juven BID, continuous tube feeds, free water 272mL q 4 hours   Continue home feeds  Strict IO    Consult dietitian on Monday to increase free water   # Seizure disorder  Continue home Valium 5 mg every 12 hours, Keppra 1000 mg twice daily, Depakene 250 mg twice daily  # A fib  Heart rates in the low 100s.  Continue apixaban  Restart Coreg   Cardiac monitoring  # HFrEF (45-50%)  Holding home Lasix and Coreg in setting of hypotension and AKI  # HTN, currently Hypotension   Hold home lasix   MAP goal > 50  # CAD  Continue ASA  #Chronic Pain  Per nurse at [place], patient takes scheduled 5-325 mg  hydrocodone q 8 hours   Re-ordered home 5-325 norco q 8 hours scheduled   #Goals of Care Palliative care on board. Spoke with NP on the phone about patient's status. Daughter has been contacted today and continues to pursue full care for her mother.   Will call daughter to update about pt's current status  #FEN/GI:  . Fluids: KVO  . Electrolytes: AM BMP   . Nutrition: Tube Feeds  Access: PICC (Right arm) - placed 06/10/18 (64 days), Gastrostomy LUQ (04/25/18) VTE prophylaxis: Chronically Anticoagulated  - Apixaban   Disposition: SNF  pending infection treatment and r/o COVID.     Subjective:  NAEO.   Objective: BP (!) 104/46   Pulse (!) 108   Temp (!) 102.5 F  (39.2 C) (Axillary)   Resp (!) 21   Ht 5\' 4"  (1.626 m)   Wt 79.3 kg   SpO2 98%   BMI 30.00 kg/m  Intake/Output      04/04 0701 - 04/05 0700 04/05 0701 - 04/06 0700   Other 0    Total Intake(mL/kg) 0 (0)    Urine (mL/kg/hr) 760    Total Output 760    Net -760         Urine Occurrence 1 x     Physical Exam: See attending attestation   Laboratory: I have personally read and reviewed all labs and imaging studies.  Trending Labs:  Creatinine: 1.04  Na: 151 > 158 > 157 CBC: Recent Labs  Lab 08/12/18 1856 08/12/18 2059 08/13/18 0259  WBC 11.4*  --  11.1*  NEUTROABS 8.6*  --  8.7*  HGB 8.7* 16.3* 8.1*  HCT 33.6* 48.0* 31.1*  MCV 93.3  --  92.3  PLT 195  --  809   Basic Metabolic Panel: Recent Labs  Lab 08/12/18 1856 08/12/18 2059 08/13/18 0259  NA 151* 158*  157*  K 4.5 4.3 4.0  CL 111  --  115*  CO2 30  --  34*  GLUCOSE 155*  --  111*  BUN 92*  --  89*  CREATININE 1.03*  --  1.04*  CALCIUM 9.5  --  9.8   GFR: Estimated Creatinine Clearance: 43.2 mL/min (A) (by C-G formula based on SCr of 1.04 mg/dL (H)). Liver Function Tests: Recent Labs  Lab 08/12/18 1856  AST 17  ALT 18  ALKPHOS 63  BILITOT 0.9  PROT 6.4*  ALBUMIN 1.9*   Cardiac Enzymes: Recent Labs  Lab 08/13/18 0051 08/13/18 0259  TROPONINI 0.10* 0.10*  Anemia Panel: Recent Labs    08/13/18 0051  FERRITIN 142   Urine analysis:    Component Value Date/Time   COLORURINE AMBER (A) 08/12/2018 1856   APPEARANCEUR CLOUDY (A) 08/12/2018 1856   APPEARANCEUR Cloudy (A) 01/29/2016 1126   LABSPEC 1.021 08/12/2018 1856   LABSPEC 1.020 01/23/2014 1047   PHURINE 5.0 08/12/2018 1856   GLUCOSEU NEGATIVE 08/12/2018 1856   GLUCOSEU NEGATIVE 01/23/2014 1047   HGBUR NEGATIVE 08/12/2018 1856   BILIRUBINUR NEGATIVE 08/12/2018 1856   BILIRUBINUR Negative 01/13/2016 1110   BILIRUBINUR NEGATIVE 01/23/2014 1047   KETONESUR NEGATIVE 08/12/2018 1856   PROTEINUR 100 (A) 08/12/2018 1856   UROBILINOGEN 0.2  08/04/2015 1140   NITRITE NEGATIVE 08/12/2018 1856   LEUKOCYTESUR SMALL (A) 08/12/2018 1856   LEUKOCYTESUR TRACE 01/23/2014 1047   Sepsis Labs: Recent Labs  Lab 08/13/18 0051 08/13/18 0259  PROCALCITON 2.91 2.76   Recent Labs  Lab 08/12/18 2059  HCO3 36.6*   Recent Results (from the past 240 hour(s))  MRSA PCR Screening     Status: None   Collection Time: 08/12/18 11:25 PM  Result Value Ref Range Status   MRSA by PCR NEGATIVE NEGATIVE Final    Comment:        The GeneXpert MRSA Assay (FDA approved for NASAL specimens only), is one component of a comprehensive MRSA colonization surveillance program. It is not intended to diagnose MRSA infection nor to guide or monitor treatment for MRSA infections. Performed at [facility], [place] [address] [place], [place] [address]      Imaging/Diagnostic Tests: Dg Chest Port 1 View Result Date: 08/12/2018 IMPRESSION: Worsening of mid and lower lung infiltrates. Tracheostomy and right arm PICC appeared well positioned.    EKG Interpretation  Date/Time:  Saturday August 12 2018 18:31:08 EDT Ventricular Rate:  104 PR Interval:    QRS Duration: 76 QT Interval:  287 QTC Calculation: 378 R Axis:   29 Text Interpretation:  Atrial fibrillation Probable anterior infarct, age indeterminate No significant change since last tracing Confirmed by Deno Etienne [phone]) on 08/12/2018 8:53:13 PM Also confirmed by Deno Etienne [phone]), editor Philomena Doheny [phone])  on 08/13/2018 7:01:14 AM        Wilber Oliphant, MD 08/13/2018, 7:47 AM PGY-1, [place] Intern pager: [phone], text pages welcome

## 2018-08-13 NOTE — Progress Notes (Signed)
CRITICAL VALUE ALERT  Critical Value:  Troponin--->0.10  Date & Time Notied:  08/13/2018   0215  Provider Notified: Warren Lacy  Orders Received/Actions taken: None at this time

## 2018-08-13 NOTE — Consult Note (Signed)
Consultation Note Date: 08/13/2018   Patient Name: Melissa Cochran  DOB: 12-30-1935  MRN: [id number]  Age / Sex: 83 y.o., female  PCP: [name], MD Referring Physician: Lind Covert, MD  Reason for Consultation: Establishing goals of care  HPI/Patient Profile: 83 y.o. female  with past medical history of cardiac arrest, trach/PEG, vent dependent, heart failure, COPD, stroke, seizures, and a fib admitted on 08/12/2018 with fever. Patient was unresponsive but apparently this is her baseline. Patient had been started on vanc and zosyn at [facility] for suspected UTI. Chest x ray on admission revealed worsening of mid and lower lung infiltrates. Fevers and hypotension continue. PMT consulted for [place].  Clinical Assessment and Goals of Care: I have reviewed medical records including EPIC notes, labs and imaging, received report from RN, assessed the patient and then spoke with daughter, [name],  to discuss diagnosis prognosis, GOC, EOL wishes, disposition and options.  Please note, PMT has spoke with patient's daughter multiple times on previous admissions. During those conversations she has made it clear that prolonging life is ultimate goal no matter the quality of life - indicating she would want full scope measures even if patient were unable to walk, talk, interact, eat, or breathe on her own.  I introduced Palliative Medicine as specialized medical care for people living with serious illness. It focuses on providing relief from the symptoms and stress of a serious illness. The goal is to improve quality of life for both the patient and the family.  We discussed a brief life review of the patient. She tells me patient has been living in a facility since 2018 after having trach and PEG placed in hospital. She tells me patient was doing better in December - working with PT and SLP - was off the vent, using PMV, and having some PO intake. In  December she had 2 hospitalizations and she tells me theses were big setbacks for her which she has not recovered from.   As far as functional and nutritional status now, she tells me patient is bedbound - will move her arms some, opens her eye occasionally, and is dependent on PEG for all intake - remains on vent.    We discussed her current illness and what it means in the larger context of her on-going co-morbidities.  Natural disease trajectory and expectations at EOL were discussed. Discussed testing for COVID-19. Discussed continued fever, soft BPs. Discussed concern of poor prognosis d/t age and comorbidities. We discussed her decline since December. We discussed patient's frailty, pressure ulcers.  Daughter tells me she maintains hope that her mother will improve.    The difference between aggressive medical intervention and comfort care was considered in light of the patient's goals of care. She is clear she would like to continue aggressive medical interventions as she believes her mother will improve.   Advance directives, concepts specific to code status, artifical feeding and hydration, and rehospitalization were considered and discussed. She tells me she has been clear about this previously and maintains she wants full code/full scope interventions.   Emotional support provided. Discussed difficulties of having a hospitalized loved one during pandemic - difficulties with visitor restrictions.   Questions and concerns were addressed. The family was encouraged to call with questions or concerns - provided her with my contact information.   Discussed conversation with ICU RN and Dr. Valeta Harms with PCCM  Primary Decision Maker NEXT OF KIN - daughter [name]  SUMMARY OF RECOMMENDATIONS   -  have met with daughter multiple times on previous admissions - maintains desires for full scope/full code  - discussed poor prognosis - PMT will continue to provide support but doubt daughter will  electively change goals of care/code status  Code Status/Advance Care Planning:  Full code   Symptom Management:   Per Primary  Prognosis:   Unable to determine  Discharge Planning: To Be Determined      Primary Diagnoses: Present on Admission: . Bilateral pneumonia   I have reviewed the medical record, interviewed the patient and family, and examined the patient. The following aspects are pertinent.  Past Medical History:  Diagnosis Date  . Adnexal mass   . Anxiety   . Arthritis   . Bladder tumor   . Bleeding ulcer   . CHF (congestive heart failure) ([place])   . CHF (congestive heart failure) ([place])   . Chronic bronchitis ([place])   . Chronic respiratory failure ([place]) 04/19/2018  . COPD (chronic obstructive pulmonary disease) ([name])   . Coronary artery disease   . CVA (cerebral vascular accident) ([place]) 04/19/2018  . DVT (deep venous thrombosis) ([facility])    RLE  . GERD (gastroesophageal reflux disease)   . Heart block    left  . History of bleeding ulcers   . History of kidney stones   . Hyperlipemia   . Hypertension   . Myocardial infarction ([place])    "slight one" (07/05/2017)  . Pneumonia    "several times" (07/05/2017)  . Seizures ([place]) 02/2017 "several"; 06/26/2017 X 1  . Shortness of breath dyspnea   . Stroke [facility]) 02/13/2018   "still right sided weakness" (07/05/2017)   Social History   Socioeconomic History  . Marital status: Widowed    Spouse name: Not on file  . Number of children: Not on file  . Years of education: Not on file  . Highest education level: Not on file  Occupational History  . Not on file  Social Needs  . Financial resource strain: Not on file  . Food insecurity:    Worry: Not on file    Inability: Not on file  . Transportation needs:    Medical: Not on file    Non-medical: Not on file  Tobacco Use  . Smoking status: Former Smoker    Packs/day: 1.00    Years: 40.00    Pack years: 40.00    Last attempt to quit: 06/13/1998     Years since quitting: 20.1  . Smokeless tobacco: Never Used  Substance and Sexual Activity  . Alcohol use: No  . Drug use: No  . Sexual activity: Not Currently  Lifestyle  . Physical activity:    Days per week: Not on file    Minutes per session: Not on file  . Stress: Not on file  Relationships  . Social connections:    Talks on phone: Not on file    Gets together: Not on file    Attends religious service: Not on file    Active member of club or organization: Not on file    Attends meetings of clubs or organizations: Not on file    Relationship status: Not on file  Other Topics Concern  . Not on file  Social History Narrative  . Not on file   Family History  Problem Relation Age of Onset  . Stomach cancer Mother   . CVA Father   . Bladder Cancer Neg Hx   . Kidney cancer Neg Hx    Scheduled Meds: .  apixaban  5 mg Per Tube BID  . chlorhexidine  15 mL Mouth/Throat BID  . chlorhexidine gluconate (MEDLINE KIT)  15 mL Mouth Rinse BID  . Chlorhexidine Gluconate Cloth  6 each Topical Daily  . Chlorhexidine Gluconate Cloth  6 each Topical Daily  . diazepam  2.5 mg Per Tube Q12H  . famotidine  20 mg Per Tube Daily  . feeding supplement (PRO-STAT SUGAR FREE 64)  30 mL Per Tube BID  . free water  200 mL Per Tube Q4H  . levETIRAcetam  1,000 mg Per Tube BID  . mouth rinse  15 mL Mouth Rinse 10 times per day  . nutrition supplement (JUVEN)  1 packet Per Tube BID BM  . sodium chloride flush  10-40 mL Intracatheter Q12H  . sodium chloride flush  10-40 mL Intracatheter Q12H  . valproic acid  250 mg Per Tube BID  . vancomycin variable dose per unstable renal function (pharmacist dosing)   Does not apply See admin instructions   Continuous Infusions: . sodium chloride 120 mL/hr at 08/13/18 0900  . feeding supplement (VITAL AF 1.2 CAL) 1,000 mL (08/13/18 0820)  . meropenem (MERREM) IV Stopped (08/13/18 0845)   PRN Meds:.acetaminophen, sodium chloride flush, sodium chloride flush No  Known Allergies  Vital Signs: BP 94/62   Pulse (!) 106   Temp (!) 101.6 F (38.7 C) (Oral)   Resp (!) 21   Ht '5\' 4"'$  (1.626 m)   Wt 79.3 kg   SpO2 98%   BMI 30.00 kg/m  Pain Scale: CPOT       SpO2: SpO2: 98 % O2 Device:SpO2: 98 % O2 Flow Rate: .   IO: Intake/output summary:   Intake/Output Summary (Last 24 hours) at 08/13/2018 1014 Last data filed at 08/13/2018 0900 Gross per 24 hour  Intake 1061.89 ml  Output 820 ml  Net 241.89 ml    LBM:   Baseline Weight: Weight: 79.3 kg Most recent weight: Weight: 79.3 kg     Palliative Assessment/Data: PPS 30%     Time Total: 70 minutes Greater than 50%  of this time was spent counseling and coordinating care related to the above assessment and plan.  Juel Burrow, DNP, AGNP-C Palliative Medicine Team [phone] Pager: [phone]

## 2018-08-13 NOTE — Progress Notes (Signed)
Paged by nurse that patient with MAP 53.  Ordered 1L NS bolus.  Will continue to monitor.  Plan to contact CCM regarding pressors if MAP <50.   [place], DO

## 2018-08-13 NOTE — Progress Notes (Signed)
Spoke with bedside nurse regarding exchange PICC order.  IV team will not exchange PICC if questionably source of infection as well as PICC was placed at [facility] ([facility] does not exchange PICCs placed at outside facilities).  Bedside RN to call MD and clarify orders and notify IV team if PIV is needed.  Carolee Rota, RN [facility]AST

## 2018-08-13 NOTE — Progress Notes (Signed)
Pt b/p 72/36 with map of 67, contact Family med and got order for 1L bolus, and will continue to monitor.

## 2018-08-13 NOTE — Progress Notes (Addendum)
Following 1L NS bolus, patient's MAP remains 50 with most recent recheck.  Page sent to [place] provider.  Spoke with CCM provider who ordered Levophed.  Due to patient being on pressors, will transfer to [facility] service.  The Family Medicine Team appreciates the excellent care provided by CCM and will continue to follow patient while in ICU.  We will be happy to take patient back to our service when she is no longer on vasopressors.  [place], DO

## 2018-08-14 ENCOUNTER — Inpatient Hospital Stay (HOSPITAL_COMMUNITY): Payer: Medicare Other

## 2018-08-14 DIAGNOSIS — J9621 Acute and chronic respiratory failure with hypoxia: Secondary | ICD-10-CM

## 2018-08-14 DIAGNOSIS — J9601 Acute respiratory failure with hypoxia: Secondary | ICD-10-CM

## 2018-08-14 LAB — CBC WITH DIFFERENTIAL/PLATELET
Abs Immature Granulocytes: 0 10*3/uL (ref 0.00–0.07)
Basophils Absolute: 0.1 10*3/uL (ref 0.0–0.1)
Basophils Relative: 1 %
Eosinophils Absolute: 0.6 10*3/uL — ABNORMAL HIGH (ref 0.0–0.5)
Eosinophils Relative: 5 %
HCT: 29 % — ABNORMAL LOW (ref 36.0–46.0)
Hemoglobin: 7.8 g/dL — ABNORMAL LOW (ref 12.0–15.0)
Lymphocytes Relative: 13 %
Lymphs Abs: 1.5 10*3/uL (ref 0.7–4.0)
MCH: 24.1 pg — ABNORMAL LOW (ref 26.0–34.0)
MCHC: 26.9 g/dL — ABNORMAL LOW (ref 30.0–36.0)
MCV: 89.8 fL (ref 80.0–100.0)
Monocytes Absolute: 0.2 10*3/uL (ref 0.1–1.0)
Monocytes Relative: 2 %
Neutro Abs: 9.3 10*3/uL — ABNORMAL HIGH (ref 1.7–7.7)
Neutrophils Relative %: 79 %
Platelets: 216 10*3/uL (ref 150–400)
RBC: 3.23 MIL/uL — ABNORMAL LOW (ref 3.87–5.11)
RDW: 17.7 % — ABNORMAL HIGH (ref 11.5–15.5)
WBC: 11.8 10*3/uL — ABNORMAL HIGH (ref 4.0–10.5)
nRBC: 0.2 % (ref 0.0–0.2)
nRBC: 1 /100 WBC — ABNORMAL HIGH

## 2018-08-14 LAB — POCT I-STAT 7, (LYTES, BLD GAS, ICA,H+H)
Acid-Base Excess: 4 mmol/L — ABNORMAL HIGH (ref 0.0–2.0)
Bicarbonate: 27.3 mmol/L (ref 20.0–28.0)
Calcium, Ion: 1.32 mmol/L (ref 1.15–1.40)
HCT: 29 % — ABNORMAL LOW (ref 36.0–46.0)
Hemoglobin: 9.9 g/dL — ABNORMAL LOW (ref 12.0–15.0)
O2 Saturation: 99 %
Patient temperature: 99.1
Potassium: 3.4 mmol/L — ABNORMAL LOW (ref 3.5–5.1)
Sodium: 158 mmol/L — ABNORMAL HIGH (ref 135–145)
TCO2: 28 mmol/L (ref 22–32)
pCO2 arterial: 35.8 mmHg (ref 32.0–48.0)
pH, Arterial: 7.492 — ABNORMAL HIGH (ref 7.350–7.450)
pO2, Arterial: 108 mmHg (ref 83.0–108.0)

## 2018-08-14 LAB — BASIC METABOLIC PANEL
Anion gap: 10 (ref 5–15)
BUN: 87 mg/dL — ABNORMAL HIGH (ref 8–23)
CO2: 27 mmol/L (ref 22–32)
Calcium: 9.3 mg/dL (ref 8.9–10.3)
Chloride: 120 mmol/L — ABNORMAL HIGH (ref 98–111)
Creatinine, Ser: 0.87 mg/dL (ref 0.44–1.00)
GFR calc Af Amer: >60 mL/min (ref >60)
GFR calc non Af Amer: >60 mL/min (ref >60)
Glucose, Bld: 132 mg/dL — ABNORMAL HIGH (ref 70–99)
Potassium: 3.4 mmol/L — ABNORMAL LOW (ref 3.5–5.1)
Sodium: 157 mmol/L — ABNORMAL HIGH (ref 135–145)

## 2018-08-14 LAB — GLUCOSE, CAPILLARY
Glucose-Capillary: 106 mg/dL — ABNORMAL HIGH (ref 70–99)
Glucose-Capillary: 106 mg/dL — ABNORMAL HIGH (ref 70–99)
Glucose-Capillary: 108 mg/dL — ABNORMAL HIGH (ref 70–99)
Glucose-Capillary: 110 mg/dL — ABNORMAL HIGH (ref 70–99)
Glucose-Capillary: 117 mg/dL — ABNORMAL HIGH (ref 70–99)
Glucose-Capillary: 120 mg/dL — ABNORMAL HIGH (ref 70–99)
Glucose-Capillary: 126 mg/dL — ABNORMAL HIGH (ref 70–99)
Glucose-Capillary: 159 mg/dL — ABNORMAL HIGH (ref 70–99)

## 2018-08-14 LAB — NOVEL CORONAVIRUS, NAA (HOSP ORDER, SEND-OUT TO REF LAB; TAT 18-24 HRS): SARS-CoV-2, NAA: NOT DETECTED

## 2018-08-14 LAB — PROCALCITONIN: Procalcitonin: 1.5 ng/mL

## 2018-08-14 LAB — PHOSPHORUS: Phosphorus: 1.9 mg/dL — ABNORMAL LOW (ref 2.5–4.6)

## 2018-08-14 LAB — MAGNESIUM: Magnesium: 2.6 mg/dL — ABNORMAL HIGH (ref 1.7–2.4)

## 2018-08-14 MED ORDER — FREE WATER
300.0000 mL | Status: DC
  Administered 2018-08-14 – 2018-08-21 (×40): 300 mL

## 2018-08-14 MED ORDER — NOREPINEPHRINE 4 MG/250ML-% IV SOLN
0.0000 ug/min | INTRAVENOUS | Status: DC
  Administered 2018-08-14: 2 ug/min via INTRAVENOUS
  Administered 2018-08-17: 01:00:00 7 ug/min via INTRAVENOUS
  Administered 2018-08-18: 2 ug/min via INTRAVENOUS
  Filled 2018-08-14 (×3): qty 250

## 2018-08-14 MED ORDER — VITAL AF 1.2 CAL PO LIQD
1000.0000 mL | ORAL | Status: AC
  Administered 2018-08-15: 1000 mL

## 2018-08-14 MED ORDER — LACTATED RINGERS IV BOLUS
1000.0000 mL | Freq: Once | INTRAVENOUS | Status: AC
  Administered 2018-08-14: 1000 mL via INTRAVENOUS

## 2018-08-14 MED ORDER — POTASSIUM CHLORIDE 20 MEQ/15ML (10%) PO SOLN
20.0000 meq | Freq: Once | ORAL | Status: AC
  Administered 2018-08-14: 20 meq
  Filled 2018-08-14: qty 15

## 2018-08-14 MED ORDER — COLLAGENASE 250 UNIT/GM EX OINT
TOPICAL_OINTMENT | Freq: Every day | CUTANEOUS | Status: DC
  Administered 2018-08-14 – 2018-08-16 (×3): via TOPICAL
  Administered 2018-08-17: 2 via TOPICAL
  Administered 2018-08-18 – 2018-08-21 (×4): via TOPICAL
  Administered 2018-08-22: 1 via TOPICAL
  Administered 2018-08-23: 15:00:00 via TOPICAL
  Administered 2018-08-24 – 2018-08-25 (×2): 1 via TOPICAL
  Filled 2018-08-14: qty 30

## 2018-08-14 MED ORDER — ACETAMINOPHEN 325 MG PO TABS
650.0000 mg | ORAL_TABLET | Freq: Four times a day (QID) | ORAL | Status: DC | PRN
  Administered 2018-08-14 – 2018-08-19 (×8): 650 mg via ORAL
  Filled 2018-08-14 (×9): qty 2

## 2018-08-14 MED ORDER — PRO-STAT SUGAR FREE PO LIQD
60.0000 mL | Freq: Every day | ORAL | Status: DC
  Administered 2018-08-14 – 2018-08-15 (×2): 60 mL
  Filled 2018-08-14: qty 60

## 2018-08-14 MED ORDER — ALBUMIN HUMAN 5 % IV SOLN
25.0000 g | Freq: Once | INTRAVENOUS | Status: AC
  Administered 2018-08-14: 02:00:00 25 g via INTRAVENOUS
  Filled 2018-08-14: qty 250

## 2018-08-14 MED ORDER — SODIUM CHLORIDE 0.9 % IV SOLN
1.0000 g | Freq: Three times a day (TID) | INTRAVENOUS | Status: DC
  Administered 2018-08-14 – 2018-08-16 (×7): 1 g via INTRAVENOUS
  Filled 2018-08-14 (×12): qty 1

## 2018-08-14 NOTE — Progress Notes (Signed)
Name: Melissa Cochran MRN: [id number] DOB: 12-05-1935 CC: SOB   LOS: 2  Referring Provider:  DR.Mecariello Reason for Referral:  HYPOTENSION  CRITICAL CARE MEDICINE  Brief History   83 year old woman transferred from [place] with fever 102.5, hx of  chronic respiratory failure,  tracheostomy and ventilator and PEG dependent, atrial fibrillation on apixaban, HTN, CAD, s/p Stent 3295, Chronic systolic CHF EF 18-84% 1/66/06, nephrolithiasis complicated by hydronephrosis s/p stent placement in 2017, CVA, seizure disorder.  Admitted 4/4 with fever and hypoxemia.  History of present illness   Patient was on the medical floor receiving broad spectrum abx for HCAP and rule COVID. Today she became hypotensive with MAP's in the 50's , requiring low dose levophed and transfer to the ICU. She has baseline end stage dementia, non verbal and has a trach and PEG.  Past Medical History:  Diagnosis Date  . Adnexal mass   . Anxiety   . Arthritis   . Bladder tumor   . Bleeding ulcer   . CHF (congestive heart failure) ([place])   . CHF (congestive heart failure) ([place])   . Chronic bronchitis ([place])   . Chronic respiratory failure ([place]) 04/19/2018  . COPD (chronic obstructive pulmonary disease) ([place])   . Coronary artery disease   . CVA (cerebral vascular accident) ([place]) 04/19/2018  . DVT (deep venous thrombosis) ([facility])    RLE  . GERD (gastroesophageal reflux disease)   . Heart block    left  . History of bleeding ulcers   . History of kidney stones   . Hyperlipemia   . Hypertension   . Myocardial infarction ([place])    "slight one" (07/05/2017)  . Pneumonia    "several times" (07/05/2017)  . Seizures ([place]) 02/2017 "several"; 06/26/2017 X 1  . Shortness of breath dyspnea   . Stroke [facility]) 02/13/2018   "still right sided weakness" (07/05/2017)   Past Surgical History:  Procedure Laterality Date  . ABDOMINAL HYSTERECTOMY    . CARDIAC CATHETERIZATION Left 10/21/2015   Procedure: Left Heart Cath and  Coronary Angiography;  Surgeon: Isaias Cowman, MD;  Location: [facility];  Service: Cardiovascular;  Laterality: Left;  . CARDIAC CATHETERIZATION N/A 10/21/2015   Procedure: Coronary Stent Intervention;  Surgeon: Isaias Cowman, MD;  Location: [place];  Service: Cardiovascular;  Laterality: N/A;  . CARDIAC CATHETERIZATION    . CYSTOSCOPY W/ RETROGRADES Bilateral 01/20/2016   Procedure: CYSTOSCOPY WITH RETROGRADE PYELOGRAM;  Surgeon: Hollice Espy, MD;  Location: [facility];  Service: Urology;  Laterality: Bilateral;  . CYSTOSCOPY WITH STENT PLACEMENT Right 01/20/2016   Procedure: CYSTOSCOPY WITH STENT PLACEMENT;  Surgeon: Hollice Espy, MD;  Location: [facility];  Service: Urology;  Laterality: Right;  . HIP FRACTURE SURGERY Left   . STOMACH SURGERY     "stomach ulcers"  . TRACHEOSTOMY TUBE PLACEMENT N/A 03/02/2017   Procedure: TRACHEOSTOMY;  Surgeon: Rozetta Nunnery, MD;  Location: [facility];  Service: ENT;  Laterality: N/A;  . TRANSURETHRAL RESECTION OF BLADDER TUMOR N/A 01/20/2016   Procedure: TRANSURETHRAL RESECTION OF BLADDER TUMOR (TURBT);  Surgeon: Hollice Espy, MD;  Location: [facility];  Service: Urology;  Laterality: N/A;   Prior to Admission medications   Medication Sig Start Date End Date Taking? Authorizing Provider  Amino Acids-Protein Hydrolys (FEEDING SUPPLEMENT, PRO-STAT SUGAR FREE 64,) LIQD Place 30 mLs into feeding tube 2 (two) times daily. 06/26/18  Yes Cherene Altes, MD  apixaban (ELIQUIS) 5 MG TABS tablet Place 1 tablet (5 mg total)  into feeding tube 2 (two) times daily. 06/26/18  Yes Cherene Altes, MD  carvedilol (COREG) 6.25 MG tablet Place 1 tablet (6.25 mg total) into feeding tube 2 (two) times daily with a meal. 06/19/18  Yes Cherene Altes, MD  chlorhexidine (PERIDEX) 0.12 % solution 5 mLs See admin instructions. 5 ml's for trach care 2 times a day   Yes [provider]  diazepam (VALIUM) 5 MG tablet Place 0.5 tablets  (2.5 mg total) into feeding tube every 12 (twelve) hours. 06/19/18  Yes Cherene Altes, MD  famotidine (PEPCID) 20 MG tablet Place 20 mg into feeding tube daily.    Yes [provider]  furosemide (LASIX) 40 MG tablet Place 1 tablet (40 mg total) into feeding tube daily. Patient taking differently: Place 40 mg into feeding tube 2 (two) times daily.  06/19/18  Yes Cherene Altes, MD  HYDROcodone-acetaminophen (NORCO/VICODIN) 5-325 MG tablet Place 1 tablet into feeding tube every 8 (eight) hours.    Yes [provider]  ipratropium-albuterol (DUONEB) 0.5-2.5 (3) MG/3ML SOLN Take 3 mLs by nebulization as needed (for acute and chronic respiratory failure, unspecified whether with hypoxia or hypercapnia).    Yes [provider]  levETIRAcetam (KEPPRA) 100 MG/ML solution Place 10 mLs (1,000 mg total) into feeding tube 2 (two) times daily. 06/19/18  Yes Cherene Altes, MD  morphine (MSIR) 15 MG tablet Place 7.5 mg into feeding tube every 6 (six) hours as needed (for dyspnea).   Yes [provider]  Multiple Vitamin (MULTIVITAMIN) tablet Place 1 tablet into feeding tube daily.   Yes [provider]  nutrition supplement, JUVEN, (JUVEN) PACK Place 1 packet into feeding tube 2 (two) times daily between meals. 06/19/18  Yes Cherene Altes, MD  Nutritional Supplements (ISOSOURCE 1.5 CAL) LIQD Place 70 mLs into feeding tube 2 (two) times daily.    Yes [provider]  piperacillin-tazobactam (ZOSYN) 3.375 (3-0.375) g injection Inject 3.375 g into the vein every 8 (eight) hours. FOR 10 DAYS 08/09/18 08/18/18 Yes [provider]  Sodium Chloride Flush (NORMAL SALINE FLUSH IV) Inject 10 mLs into the vein See admin instructions. Use 10 ml's via IV every shift for any used lumens- flush each unused lumen   Yes [provider]  Valproate Sodium (DEPAKENE) 250 MG/5ML SOLN solution Place 5 mLs (250 mg total) into feeding tube 2 (two) times  daily. 06/19/18  Yes Joette Catching T, MD  vancomycin 1,000 mg in sodium chloride 0.9 % 250 mL Inject 1,000 mg into the vein daily. FOR 10 DAYS 08/09/18 08/18/18 Yes [provider]  acetaminophen (TYLENOL) 160 MG/5ML solution Place 20.3 mLs (650 mg total) into feeding tube every 6 (six) hours as needed for mild pain, headache or fever. Patient not taking: Reported on 08/12/2018 06/19/18   Cherene Altes, MD  aspirin 81 MG chewable tablet Place 1 tablet (81 mg total) into feeding tube daily. Patient not taking: Reported on 08/12/2018 06/19/18   Cherene Altes, MD  collagenase (SANTYL) ointment Apply topically daily. Patient not taking: Reported on 08/12/2018 06/18/18   Georgette Shell, MD  Multiple Vitamin (MULTIVITAMIN) LIQD Place 15 mLs into feeding tube daily. Patient not taking: Reported on 08/12/2018 06/19/18   Cherene Altes, MD  Nutritional Supplements (FEEDING SUPPLEMENT, VITAL AF 1.2 CAL,) LIQD Place 1,000 mLs into feeding tube continuous. Patient not taking: Reported on 08/12/2018 06/26/18   Cherene Altes, MD  Water For Irrigation, Sterile (FREE WATER) [place]  Place 200 mLs into feeding tube every 4 (four) hours. Patient not taking: Reported on 08/12/2018 06/17/18   Georgette Shell, MD   Allergies No Known Allergies  Family History Family History  Problem Relation Age of Onset  . Stomach cancer Mother   . CVA Father   . Bladder Cancer Neg Hx   . Kidney cancer Neg Hx    Social History  reports that she quit smoking about 20 years ago. She has a 40.00 pack-year smoking history. She has never used smokeless tobacco. She reports that she does not drink alcohol or use drugs.  Review Of Systems:  UNABLE TO COMPLETE GIVEN MENTAL STATUS  Current Status:TRANFER TO ICU  Vital Signs: Temp:  [99 F (37.2 C)-102.5 F (39.2 C)] 99.1 F (37.3 C) (04/06 0000) Pulse Rate:  [94-112] 97 (04/06 0100) Resp:  [19-25] 21 (04/06 0100) BP: (72-128)/(36-68) 106/64 (04/06  0100) SpO2:  [95 %-100 %] 99 % (04/06 0100) FiO2 (%):  [40 %-50 %] 40 % (04/05 2200)  Physical Examination: General:  Obtunded, does not follow commands Neuro:  Contracted extremities, does not follow commands Cardiovascular:  rrr , no m/r/g Lungs: diffuse ronchi and crackles , no wheezing Abdomen:  Peg in place , bs decreased Musculoskeletal:  Contracted extremities Skin:  Dry , cap refill ~ 2 seconds  Active Problems:   Bilateral pneumonia   Goals of care, counseling/discussion   Palliative care by specialist   Sepsis with acute organ dysfunction without septic shock ([place])   ASSESSMENT AND PLAN  PULMONARY Recent Labs  Lab 08/12/18 2059  PHART 7.443  PCO2ART 54.6*  PO2ART 100.0  HCO3 36.6*  O2SAT 97.0   Ventilator Settings: Vent Mode: PCV FiO2 (%):  [40 %-50 %] 40 % Set Rate:  [21 bmp] 21 bmp PEEP:  [5 cmH20] 5 cmH20 Pressure Support:  [5 cmH20] 5 cmH20 Plateau Pressure:  [24 cmH20-25 cmH20] 24 cmH20 CXR:  Diffuse bilateral infiltrates   CARDIOVASCULAR Recent Labs  Lab 08/12/18 1856 08/12/18 2037 08/13/18 0051 08/13/18 0259 08/13/18 1517  TROPONINI  --   --  0.10* 0.10* 0.09*  LATICACIDVEN 2.1* 2.2*  --  1.9  --     RENAL Recent Labs  Lab 08/12/18 1856 08/12/18 2059 08/13/18 0259  NA 151* 158* 157*  K 4.5 4.3 4.0  CL 111  --  115*  CO2 30  --  34*  BUN 92*  --  89*  CREATININE 1.03*  --  1.04*  CALCIUM 9.5  --  9.8   Intake/Output      04/05 0701 - 04/06 0700   I.V. (mL/kg) 1217 (15.3)   Other 330   NG/GT 1080   IV Piggyback 700   Total Intake(mL/kg) 3327 (42)   Urine (mL/kg/hr) 755 (0.4)   Drains 0   Stool 0   Total Output 755   Net +2572       Stool Occurrence 3 x     GASTROINTESTINAL Recent Labs  Lab 08/12/18 1856  AST 17  ALT 18  ALKPHOS 63  BILITOT 0.9  PROT 6.4*  ALBUMIN 1.9*    HEMATOLOGIC Recent Labs  Lab 08/12/18 1856 08/12/18 2059 08/13/18 0259  HGB 8.7* 16.3* 8.1*  HCT 33.6* 48.0* 31.1*  PLT 195  --   186    INFECTIOUS Recent Labs  Lab 08/12/18 1856 08/13/18 0051 08/13/18 0259  WBC 11.4*  --  11.1*  PROCALCITON  --  2.91 2.76    Acute on chronic resp failure  with hypoxemia secondary to HCAP Hx of MDR acinetobacter Meropenem and Vanco R/O COVID-19 , pending Viral panel negative She is chronically on the vent via trach with end stage dementia and getting feeds via PEG  Hypotension - secondary to sepsis , she is fluid responsive and on low dose levophed , hopefully we can come off after some fluid resuscitation.  Lactic acidosis - stable  End stage dementia - family has unrealistic expectations of recovery. She is bedbound with trach and peg. Palliative care on the case  Afib on eliquis - I would strongly consider stopping this, see no benefit   BEST PRACTICE / DISPOSITION Code Status:  FULL Diet:  TF DVT Px:  eliquis GI Px:  PPI  Critical care time : 40 minutes  Roxanne Mins, M.D. Critical Care Medicine  08/14/2018, 1:32 AM

## 2018-08-14 NOTE — Progress Notes (Signed)
FPTS Social Note  The family medicine team continues to follow patient's progress in the ICU.  We appreciate the excellent care provided by the critical care team and will resume care of this patient when she is transferred from their service.  Amanda C. Shan Levans, MD PGY-2, [facility] 08/14/2018 5:16 PM

## 2018-08-14 NOTE — Progress Notes (Signed)
PCCM communication note  Spoke with daughter on phone. Updated daughter on test results and that we will likely plan for patient to move to other ICU. Updated on daily plan and answered questions about the patient's temperature.   Eliseo Gum MSN, AGACNP-BC [place] [phone] If no answer, [phone] 08/14/2018, 11:50 AM

## 2018-08-14 NOTE — Consult Note (Addendum)
[facility] Nurse wound consult note Reason for Consult: Consult requested for multiple wounds.  Wound type: Left outer ankle deep tissue pressure injury; dark red .8X.8cm  Left outer calf with dark red deep tissue pressure injury; dark red 11X1.5cm Left heel with unstageable pressure injury; 8X4cm dry eschar Right heel unstageable pressure injury1.5X1cm dry eschar Middle back stage 4 pressure injury, bone palpable; 4X2X.3cm, 50% red, 50% yellow, mod amt yellow drainage, no odor Sacrum stage 4 pressure injury, bone palpable; 5X4.5X.3cm, 50% red, 50% yellow, mod amt yellow drainage, no odor Dressing procedure/placement/frequency: Float heels to reduce pressure.  Pt is on a low airloss bed to reduce pressure; she has multiple systemic factors which can impair healing.  It is best practice to leave dry stable eschar in place to bilat heels.  Foam dressing to protect left ankle and leg from further injury.  Santyl ointment to provide enzymatic debridement of nonviable tissue to back and sacrum wounds.  Please re-consult if further assistance is needed.  Thank-you,  Julien Girt MSN, [place], [place], [place], [place]

## 2018-08-14 NOTE — Progress Notes (Signed)
[facility] Progress Note Patient Name: Melissa Cochran DOB: [birth year] MRN: [id number]   Date of Service  08/14/2018  HPI/Events of Note  Notified by family medicine resident that patient has been hypotensive with MAPs in the 52s despite IVF resuscitation.  Pt with chronic respiratory failure with trache, admitted for pneumonia, possible COVID and UTI.    eICU Interventions  Start on levophed.  Transfer to ICU service.      Intervention Category Intermediate Interventions: Hypotension - evaluation and management  Elsie Lincoln 08/14/2018, 1:01 AM

## 2018-08-14 NOTE — Progress Notes (Addendum)
Initial Nutrition Assessment RD working remotely.  DOCUMENTATION CODES:   Not applicable  INTERVENTION:    Vital AF 1.2 at 55 ml/h (1320 ml per day)  Pro-stat 60 ml once daily  Provides 1784 kcal, 129 gm protein, 1071 ml free water daily  Continue Juven 1 packet BID; supplement provides CaHMB, glutamine, arginine, collagen protein, and micronutrients to promote wound healing  NUTRITION DIAGNOSIS:   Increased nutrient needs related to wound healing as evidenced by estimated needs.  GOAL:   Patient will meet greater than or equal to 90% of their needs  MONITOR:   Vent status, TF tolerance, Labs, Skin, I & O's  REASON FOR ASSESSMENT:   Ventilator, Consult Enteral/tube feeding initiation and management  ASSESSMENT:   83 yo female with PMH of trach, vent dependence, PEG, HTN, HLD, heart block, CHF, CAD, COPD, CVA, and seizures who was admitted from [facility] with fever. R/O COVID-19.  Received MD Consult for TF initiation and management. Currently receiving Vital AF 1.2 at 45 ml/h via PEG with Pro-stat 30 ml BID. Tolerating well.    Patient is currently intubated on ventilator support MV: 11.9 L/min Temp (24hrs), Avg:99.7 F (37.6 C), Min:98.7 F (37.1 C), Max:101.5 F (38.6 C)   Labs reviewed. Sodium 157 (H), potassium 3.4 (L), phosphorus 1.9 (L), magnesium 2.6 (H) CBG's: 106-120 Medications reviewed and include Juven 1 packet BID, Levophed, free water flushes 300 ml every 4 hours.  Per RN documentation, patient has generalized mild pitting edema, deep pitting edema to RUE, and mild pitting edema to LUE.  NUTRITION - FOCUSED PHYSICAL EXAM:  unable to complete  Diet Order:   Diet Order            Diet NPO time specified  Diet effective now              EDUCATION NEEDS:   No education needs have been identified at this time  Skin:  Skin Assessment: Skin Integrity Issues: Skin Integrity Issues:: Other (Comment), Unstageable, DTI, Stage IV DTI: L  leg Stage IV: sacrum, vertebral column Unstageable: L heel, R heel Other: non pressure wound to L ankle   Last BM:  4/6 (type 7)  Height:   Ht Readings from Last 1 Encounters:  08/12/18 5\' 4"  (1.626 m)    Weight:   Wt Readings from Last 1 Encounters:  08/12/18 79.3 kg  Actual weight is likely lower due to current edema 73.7 kg in February 2020  Ideal Body Weight:  54.5 kg  BMI:  Body mass index is 30 kg/m.  Estimated Nutritional Needs:   Kcal:  1750  Protein:  120-140 gm  Fluid:  1.8 L    Molli Barrows, RD, LDN, [facility] Pager [phone] After Hours Pager [phone]

## 2018-08-14 NOTE — Progress Notes (Addendum)
Name: Melissa Cochran MRN: [id number] DOB: 06-13-1935 CC: SOB   LOS: 2  Referring Provider:  DR.Mecariello Reason for Referral:  HYPOTENSION  CRITICAL CARE MEDICINE  Brief History   83 year old woman transferred from [place] with fever 102.5, hx of  chronic respiratory failure,  tracheostomy and ventilator and PEG dependent, atrial fibrillation on apixaban, HTN, CAD, s/p Stent 2353, Chronic systolic CHF EF 61-44% 07/23/38, nephrolithiasis complicated by hydronephrosis s/p stent placement in 2017, CVA, seizure disorder.  Admitted 4/4 with fever and hypoxemia.  History of present illness   Patient was on the medical floor receiving broad spectrum abx for HCAP and rule COVID. Today she became hypotensive with MAP's in the 50's , requiring low dose levophed and transfer to the ICU. She has baseline end stage dementia, non verbal and has a trach and PEG.  Vital Signs: Temp:  [98.7 F (37.1 C)-101.5 F (38.6 C)] 98.7 F (37.1 C) (04/06 0400) Pulse Rate:  [90-110] 93 (04/06 0700) Resp:  [16-25] 16 (04/06 0700) BP: (72-125)/(36-76) 113/61 (04/06 0700) SpO2:  [95 %-100 %] 100 % (04/06 0700) FiO2 (%):  [40 %] 40 % (04/06 0600)   Images CXR:  4/6> interval increase in airspace opacities, cardiomegaly. Personally reviewed   Procedures:  Micro: BCX 4/4> NG at 2 days UA 4/4 no growth RVP 4/4> negative COVID-19 4/4> Not detected   Physical Examination: General:  Chronically ill adult obtunded female, trach/vent in bed, NAD  Neuro:  Obtunded. Does not follow commands. Chronic contractures of hands, head toward left shoulder.  Cardiovascular:  RRR s1s2 no r/g/m.  Lungs: Diffuse rhonchi bilaterally. No accessory muscle recruitment. Symmetrical chest wall expansion  Abdomen:  PEG appreciated. Abdomen round, soft, hypoactive bowel sounds.  Musculoskeletal:  Contracted extremities. Symmetrical bulk. Non-pitting edema noted BLE.  Skin: Pale, clean, dry, warm, without rash. Pressure  injury present on arrival   Active Problems:   Bilateral pneumonia   Goals of care, counseling/discussion   Palliative care by specialist   Sepsis with acute organ dysfunction without septic shock ([place])  Interval Events: Off pressors   FiO2 40%  ASSESSMENT AND PLAN  PULMONARY Recent Labs  Lab 08/12/18 2059 08/14/18 0337  PHART 7.443 7.492*  PCO2ART 54.6* 35.8  PO2ART 100.0 108.0  HCO3 36.6* 27.3  O2SAT 97.0 99.0   Ventilator Settings: Vent Mode: PCV FiO2 (%):  [40 %] 40 % Set Rate:  [21 bmp] 21 bmp PEEP:  [5 cmH20] 5 cmH20 Plateau Pressure:  [24 cmH20-26 cmH20] 26 cmH20    CARDIOVASCULAR Recent Labs  Lab 08/12/18 1856 08/12/18 2037 08/13/18 0051 08/13/18 0259 08/13/18 1517  TROPONINI  --   --  0.10* 0.10* 0.09*  LATICACIDVEN 2.1* 2.2*  --  1.9  --     RENAL Recent Labs  Lab 08/12/18 1856 08/12/18 2059 08/13/18 0259 08/14/18 0337 08/14/18 0701  NA 151* 158* 157* 158* 157*  K 4.5 4.3 4.0 3.4* 3.4*  CL 111  --  115*  --  120*  CO2 30  --  34*  --  27  BUN 92*  --  89*  --  87*  CREATININE 1.03*  --  1.04*  --  0.87  CALCIUM 9.5  --  9.8  --  9.3  MG  --   --   --   --  2.6*  PHOS  --   --   --   --  1.9*   Intake/Output      04/05 0701 - 04/06  0700 04/06 0701 - 04/07 0700   I.V. (mL/kg) 1217 (15.3)    Other 330    NG/GT 1575    IV Piggyback 700    Total Intake(mL/kg) 3822 (48.2)    Urine (mL/kg/hr) 1255 (0.7)    Drains 0    Stool 0    Total Output 1255    Net +2567         Urine Occurrence 1 x    Stool Occurrence 6 x      GASTROINTESTINAL Recent Labs  Lab 08/12/18 1856  AST 17  ALT 18  ALKPHOS 63  BILITOT 0.9  PROT 6.4*  ALBUMIN 1.9*    HEMATOLOGIC Recent Labs  Lab 08/12/18 1856 08/12/18 2059 08/13/18 0259 08/14/18 0337  HGB 8.7* 16.3* 8.1* 9.9*  HCT 33.6* 48.0* 31.1* 29.0*  PLT 195  --  186  --     INFECTIOUS Recent Labs  Lab 08/12/18 1856 08/13/18 0051 08/13/18 0259 08/14/18 0701  WBC 11.4*  --  11.1*  --    PROCALCITON  --  2.91 2.76 1.50    Acute on chronic resp failure with hypoxemia secondary to HCAP PCT 1.50 Hx of MDR acinetobacter P Continue Meropenum and Vanc  COVID-19 not detected  Viral panel negative She is chronically on the vent via trach with end stage dementia and getting feeds via PEG AM CXR PRN ABG  VAP bundle   Hypotension, improved Lactic acidosis   - secondary to sepsis , she is fluid responsive and required low-dose levophed temorarily -off pressors now P Increasing FWF as below for hyponatremia, anticipate this will aid in volume resuscitation MAP goal > 65   Can use NE as needed to meet MAP goal however would favor volume resuscitation in this case  End stage dementia - family has unrealistic expectations of recovery.  She is bedbound with trach and peg. P  Palliative care on the case  Afib on eliquis -  Continue telemetry   Electrolyte abnormalities Hypokalemia\ Hyeprnatremia P Monitor on BMP and replace PRN  FW deficit 4.6 L (08/14/2018), will increase FWF to 300 ml q4hr   Seizure disorder: Continue depakote and keppra   Acute anemia: Hgb 9.9 (POC test) to 7.8 (CBC) P Trend H/H on CBC (high variability seen between POC iSTAT H/H and CBC H/H for this patient) Likely anemia of chronic disease, possibly some acute dilutional component in setting of fluid resuscitation Plan to transfuse per unit protocol   Goals of Care: [facility] consulted 4/4  BEST PRACTICE / DISPOSITION Code Status:  FULL Diet:  TF DVT Px:  eliquis GI Px:  PPI Dispo: ICU   Critical care time : 35 minutes   Eliseo Gum MSN, AGACNP-BC [name] [phone] If no answer, [phone] 08/14/2018, 9:10 AM   PCCM Attending:  83 yo FM, intubated on vent, chronically ill multiple medical problems  BP 125/83   Pulse 95   Temp (!) 102 F (38.9 C) (Core)   Resp (!) 34   Ht 5\' 4"  (1.626 m)   Wt 79.3 kg   SpO2 100%   BMI 30.00 kg/m   Gen:  elderly, chronically ill  Neuro: no response to stimulus Card: sinus on tele Pulm: BL vented breaths   A:  Acute on Chronic Hypox resp failure  Intubated on MV  Shock  LA  afib on eliquis  Hx MDRO   P: Covid negative Transfer to difficult ICU   Please see not from colleague early this morning.  [facility] 08/14/2018 6:10 PM

## 2018-08-14 NOTE — Progress Notes (Addendum)
eLink Physician-Brief Progress Note Patient Name: MISHAL PROBERT DOB: 1935/10/27 MRN: [id number]   Date of Service  08/14/2018  HPI/Events of Note  83/F with chronic respiratory failure with trache, afib on apixaban, seizure disorder, with dementia, transferred from [facility] due to fever, with possible source of infection - pneumonia vs UTI and also being ruled out for COVID.  Pt on broad spectrum antibiotics and remained hypotensive on the floors. Pt was transferred to the ICU for pressor support.   On video assessment, pt is comfortable on the vent.  BP 96/56, HR 89, RR 14, 98%O2 sats.  eICU Interventions  Continue empiric antibiotics.  Follow up cultures and covid PCR.  Continue on current vent settings.  Continue anti-seizure meds - Keppra and valproic acid.  Pt is on apixaban. Famotidine for GI prophylaxis.       Intervention Category Evaluation Type: New Patient Evaluation  Elsie Lincoln 08/14/2018, 3:04 AM

## 2018-08-15 ENCOUNTER — Inpatient Hospital Stay (HOSPITAL_COMMUNITY): Payer: Medicare Other

## 2018-08-15 DIAGNOSIS — J189 Pneumonia, unspecified organism: Secondary | ICD-10-CM

## 2018-08-15 DIAGNOSIS — E870 Hyperosmolality and hypernatremia: Secondary | ICD-10-CM

## 2018-08-15 DIAGNOSIS — R6521 Severe sepsis with septic shock: Secondary | ICD-10-CM

## 2018-08-15 LAB — CBC WITH DIFFERENTIAL/PLATELET
Abs Immature Granulocytes: 0.09 10*3/uL — ABNORMAL HIGH (ref 0.00–0.07)
Basophils Absolute: 0 10*3/uL (ref 0.0–0.1)
Basophils Relative: 0 %
Eosinophils Absolute: 0.4 10*3/uL (ref 0.0–0.5)
Eosinophils Relative: 4 %
HCT: 30.7 % — ABNORMAL LOW (ref 36.0–46.0)
Hemoglobin: 8.1 g/dL — ABNORMAL LOW (ref 12.0–15.0)
Immature Granulocytes: 1 %
Lymphocytes Relative: 15 %
Lymphs Abs: 1.7 10*3/uL (ref 0.7–4.0)
MCH: 24 pg — ABNORMAL LOW (ref 26.0–34.0)
MCHC: 26.4 g/dL — ABNORMAL LOW (ref 30.0–36.0)
MCV: 91.1 fL (ref 80.0–100.0)
Monocytes Absolute: 0.5 10*3/uL (ref 0.1–1.0)
Monocytes Relative: 5 %
Neutro Abs: 8.2 10*3/uL — ABNORMAL HIGH (ref 1.7–7.7)
Neutrophils Relative %: 75 %
Platelets: 188 10*3/uL (ref 150–400)
RBC: 3.37 MIL/uL — ABNORMAL LOW (ref 3.87–5.11)
RDW: 17.5 % — ABNORMAL HIGH (ref 11.5–15.5)
WBC: 10.9 10*3/uL — ABNORMAL HIGH (ref 4.0–10.5)
nRBC: 0.2 % (ref 0.0–0.2)

## 2018-08-15 LAB — BASIC METABOLIC PANEL
Anion gap: 9 (ref 5–15)
BUN: 77 mg/dL — ABNORMAL HIGH (ref 8–23)
CO2: 26 mmol/L (ref 22–32)
Calcium: 9.1 mg/dL (ref 8.9–10.3)
Chloride: 123 mmol/L — ABNORMAL HIGH (ref 98–111)
Creatinine, Ser: 0.68 mg/dL (ref 0.44–1.00)
GFR calc Af Amer: >60 mL/min (ref >60)
GFR calc non Af Amer: >60 mL/min (ref >60)
Glucose, Bld: 125 mg/dL — ABNORMAL HIGH (ref 70–99)
Potassium: 4 mmol/L (ref 3.5–5.1)
Sodium: 158 mmol/L — ABNORMAL HIGH (ref 135–145)

## 2018-08-15 LAB — GLUCOSE, CAPILLARY
Glucose-Capillary: 106 mg/dL — ABNORMAL HIGH (ref 70–99)
Glucose-Capillary: 125 mg/dL — ABNORMAL HIGH (ref 70–99)
Glucose-Capillary: 142 mg/dL — ABNORMAL HIGH (ref 70–99)
Glucose-Capillary: 137 mg/dL — ABNORMAL HIGH (ref 70–99)
Glucose-Capillary: 131 mg/dL — ABNORMAL HIGH (ref 70–99)
Glucose-Capillary: 126 mg/dL — ABNORMAL HIGH (ref 70–99)

## 2018-08-15 MED ORDER — PRO-STAT SUGAR FREE PO LIQD
30.0000 mL | Freq: Every day | ORAL | Status: DC
  Administered 2018-08-15: 30 mL via ORAL
  Filled 2018-08-15: qty 30

## 2018-08-15 MED ORDER — PRO-STAT SUGAR FREE PO LIQD
60.0000 mL | Freq: Two times a day (BID) | ORAL | Status: DC
  Administered 2018-08-15 – 2018-08-25 (×21): 60 mL via ORAL
  Filled 2018-08-15 (×22): qty 60

## 2018-08-15 MED ORDER — OSMOLITE 1.2 CAL PO LIQD
237.0000 mL | Freq: Four times a day (QID) | ORAL | Status: DC
  Administered 2018-08-16 (×2): 237 mL
  Filled 2018-08-15 (×6): qty 237

## 2018-08-15 MED ORDER — DEXTROSE 5 % IV SOLN
INTRAVENOUS | Status: DC
  Administered 2018-08-15 – 2018-08-17 (×3): via INTRAVENOUS

## 2018-08-15 NOTE — Progress Notes (Signed)
Ice applied to patient for temp

## 2018-08-15 NOTE — Consult Note (Signed)
   [facility] CM Inpatient Consult   08/15/2018  Melissa Cochran 1935/05/31 [id number]   Patient screened for extreme high risk score of 30% for unplanned hospitalization and for 3 hospitalizations noted in the past 6 months. Assessed for post hospital follow up needs with Medicare/Next Gen beneficiary for [employer].   Patient's chart review of progress notes reveals per Dr. Elsworth Soho:  83 year [facility] 102.5, hx of chronic respiratory failure, tracheostomy and ventilator and PEG dependent, atrial fibrillation on apixaban, HTN, CAD, s/p Stent 7373, Chronic systolic CHF EF 66-81% 5/94/70, nephrolithiasis complicated by hydronephrosis s/p stent placement in 2017, CVA, seizure disorder.  Admitted 4/4 with fever and hypoxemia, septic shock. Patient is chronic ventilator noted.  Patient was long term at [facility] for care. No current [facility] Care Management needs assessed for community follow up.  For questions contact:   Natividad Brood, RN BSN [facility] Liaison  [phone] business mobile phone Toll free office [phone]

## 2018-08-15 NOTE — Progress Notes (Signed)
Family Medicine continues to follow along in the care of Melissa Cochran. We appreciate the care of the ICU team.   When the patient is stable without vasopressors, please page the team at [phone] patient sign out and to arrange transfer of care.   Zettie Cooley, M.D.  Family Medicine  PGY-1 08/15/2018 8:49 AM

## 2018-08-15 NOTE — Progress Notes (Signed)
Nutrition Follow-up   RD working remotely.  DOCUMENTATION CODES:   Not applicable  INTERVENTION:    Tube Feeding:  Change to Osmolite 1.2 237 mL QID Pro-Stat  5 packets Provides 1640 kcals, 128 g of protein and 780 mL of free water  Continue Juven BID, each packet provides 80 calories, 8 grams of carbohydrate, 2.5  grams of protein (collagen), 7 grams of L-arginine and 7 grams of L-glutamine; supplement contains CaHMB, Vitamins C, E, B12 and Zinc to promote wound healing  Total nutrition from above regimen meets 100% protein needs, 103% calorie needs  NUTRITION DIAGNOSIS:   Increased nutrient needs related to wound healing as evidenced by estimated needs.  Being addressed via TF   GOAL:   Patient will meet greater than or equal to 90% of their needs  Progressing  MONITOR:   Vent status, TF tolerance, Labs, Skin, I & O's  REASON FOR ASSESSMENT:   Ventilator, Consult Enteral/tube feeding initiation and management  ASSESSMENT:   83 yo female with PMH of trach, vent dependence, PEG, HTN, HLD, heart block, CHF, CAD, COPD, CVA, and seizures who was admitted from [facility] with fever. R/O COVID-19.  COVID-19 negative  Patient is currently intubated on ventilator support MV: 12.8 L/min Temp (24hrs), Avg:101.3 F (38.5 C), Min:100.2 F (37.9 C), Max:102.2 F (39 C)  Tolerating Vital AF 1.2 at 55 ml/hr, Pro-Stat 60 mL daily with Juven BID via G-tube per RN  No new weight  Hypernatremic, D5 at 75 ml/hr started. Pt with free water flushes of 300 mL q 4 hours as well  Labs: sodium 158 (H) Meds: D5 at 75 ml/hr    Diet Order:   Diet Order            Diet NPO time specified  Diet effective now              EDUCATION NEEDS:   No education needs have been identified at this time  Skin:  Skin Assessment: Skin Integrity Issues: Skin Integrity Issues:: Other (Comment), Unstageable, DTI, Stage IV DTI: L leg Stage IV: sacrum, vertebral column Unstageable: L heel,  R heel Other: non pressure wound to L ankle  Last BM:  4/6 (type 7)  Height:   Ht Readings from Last 1 Encounters:  08/12/18 5\' 4"  (1.626 m)    Weight:   Wt Readings from Last 1 Encounters:  08/12/18 79.3 kg    Ideal Body Weight:  54.5 kg  BMI:  Body mass index is 30 kg/m.  Estimated Nutritional Needs:   Kcal:  1750  Protein:  120-140 gm  Fluid:  1.8 L   Kerman Passey MS, RD, LDN, CNSC [phone] Pager  [phone] Weekend/On-Call Pager

## 2018-08-15 NOTE — Progress Notes (Signed)
Name: Melissa Cochran MRN: [id number] DOB: 15-Jul-1935 CC: SOB   LOS: 3  Referring Provider:  DR.Mecariello Reason for Referral:  HYPOTENSION  CRITICAL CARE MEDICINE  Brief History   83 year old woman transferred from [place] with fever 102.5, hx of  chronic respiratory failure,  tracheostomy and ventilator and PEG dependent, atrial fibrillation on apixaban, HTN, CAD, s/p Stent 2119, Chronic systolic CHF EF 41-74% 0/81/44, nephrolithiasis complicated by hydronephrosis s/p stent placement in 2017, CVA, seizure disorder.  Admitted 4/4 with fever and hypoxemia, septic shock  She has baseline end stage dementia, non verbal and has a trach and PEG. Developed septic shock 4/6 a.m. requiring Levophed   Blood cx 4/4 >> ng Urine cx 4/4 >> neg RVP neg COVID neg   SUBJ -off Levophed this morning, remains febrile Poorly responsive  Vital Signs: Temp:  [100.2 F (37.9 C)-102.2 F (39 C)] 100.8 F (38.2 C) (04/07 0700) Pulse Rate:  [91-102] 91 (04/07 0729) Resp:  [20-35] 27 (04/07 0729) BP: (88-131)/(58-83) 93/58 (04/07 0729) SpO2:  [96 %-100 %] 98 % (04/07 0729) FiO2 (%):  [40 %] 40 % (04/07 0729)  Physical Examination: General: Elderly, chronically ill-appearing, poorly responsive, no distress Neuro:  Contracted extremities, does not follow commands Cardiovascular:  rrr , no m/r/g Lungs: Bilateral scattered ronchi , no crackles Abdomen:  Peg in place , bs decreased Musculoskeletal:  Contracted extremities Skin: No rash, 1+ edema  Active Problems:   Pressure ulcer, stage 4 (HCC)   Bilateral pneumonia   Goals of care, counseling/discussion   Palliative care by specialist   Sepsis with acute organ dysfunction without septic shock ([place])   ASSESSMENT AND PLAN  PULMONARY Recent Labs  Lab 08/12/18 2059 08/14/18 0337  PHART 7.443 7.492*  PCO2ART 54.6* 35.8  PO2ART 100.0 108.0  HCO3 36.6* 27.3  O2SAT 97.0 99.0   Ventilator Settings: Vent Mode: PCV FiO2 (%):  [40 %]  40 % Set Rate:  [21 bmp] 21 bmp PEEP:  [5 cmH20] 5 cmH20 Plateau Pressure:  [16 cmH20-29 cmH20] 16 cmH20   Acute on chronic resp failure with hypoxemia secondary to HCAP Hx of MDR acinetobacter   She is chronically on the vent via trach with end stage dementia and getting feeds via PEG  Septic shock-resolved Procalcitonin and WBC coming down, can discontinue vancomycin since MRSA PCR negative, continue meropenem until respiratory culture obtained  Hypernatremia-start D5W at 75/h  End stage dementia - family has unrealistic expectations of recovery. She is bedbound with trach and peg. Palliative care on the case Ct TFs  Afib on eliquis - I would strongly consider stopping this, see no benefit   BEST PRACTICE / DISPOSITION Code Status:  FULL Diet:  TF DVT Px:  eliquis GI Px:  PPI  Summary-she is off pressors although remains febrile, other indicators such as WBC and procalcitonin suggest that she is responding to meropenem.  Goals of care here would be to continue antibiotics until she defervesces then transfer her back to [facility].  She will not wean from the ventilator.  We will ask family practice teaching service to take over care.  Per palliative care discussion on 4/5, family has very unrealistic expectations.  Doubt much more benefit of prolonging hospital admission  The patient is critically ill with multiple organ systems failure and requires high complexity decision making for assessment and support, frequent evaluation and titration of therapies, application of advanced monitoring technologies and extensive interpretation of multiple databases. Critical Care Time devoted to patient care  services described in this note independent of APP/resident  time is 32 minutes.   Kara Mead MD. Shade Flood. [place] Pager [phone] If no response call [phone]     08/15/2018, 9:10 AM

## 2018-08-15 NOTE — TOC Progression Note (Signed)
Transition of Care [facility]) - Progression Note    Patient Details  Name: Melissa Cochran MRN: [id number] Date of Birth: 1935/10/08  Transition of Care [facility]) CM/SW Contact  Estanislado Emms, LCSW Phone Number: 08/15/2018, 2:42 PM  Clinical Narrative: CSW informed of patient's medical readiness for transition back to [facility] vent SNF. Sent notes to [facility] for review. CSW to follow.      Expected Discharge Plan: [place] Barriers to Discharge: Continued Medical Work up  Expected Discharge Plan and Services Expected Discharge Plan: [place] Choice: [place] arrangements for the past 2 months: [place]                           Social Determinants of Health (SDOH) Interventions    Readmission Risk Interventions No flowsheet data found.

## 2018-08-15 NOTE — TOC Initial Note (Signed)
Transition of Care [facility]) - Initial/Assessment Note    Patient Details  Name: Melissa Cochran MRN: [id number] Date of Birth: 1935/11/28  Transition of Care [facility]) CM/SW Contact:    Estanislado Emms, LCSW Phone Number: 08/15/2018, 1:08 PM  Clinical Narrative: CSW spoke to patient's daughter, [name], on the phone. [name] confirmed patient is a resident at [facility] and would like patient to return there when medically ready. Confirmed [facility] will accept patient back at discharge. CSW to follow for progress and support with dispo planning.                 Expected Discharge Plan: Skilled Nursing Facility Barriers to Discharge: Continued Medical Work up   Patient Goals and CMS Choice Patient states their goals for this hospitalization and ongoing recovery are:: (per daughter) continue full scope of treatment and return to [facility] vent SNF      Expected Discharge Plan and Services Expected Discharge Plan: [place] Choice: [place] Living arrangements for the past 2 months: [place]                          Prior Living Arrangements/Services Living arrangements for the past 2 months: [place] Lives with:: Facility Resident Patient language and need for interpreter reviewed:: Yes Do you feel safe going back to the place where you live?: Yes      Need for Family Participation in Patient Care: Yes (Comment) Care giver support system in place?: Yes (comment)   Criminal Activity/Legal Involvement Pertinent to Current Situation/Hospitalization: No - Comment as needed  Activities of Daily Living      Permission Sought/Granted Permission sought to share information with : Facility Sport and exercise psychologist, Family Supports Permission granted to share information with : No(patient on vent)  Share Information with NAME: Alison Kubicki  Permission granted to share info w AGENCY: [facility]  Permission granted to share info w Relationship: daughter  Permission granted to share info w Contact Information: [phone]  Emotional Assessment Appearance:: Appears stated age Attitude/Demeanor/Rapport: Unable to Assess Affect (typically observed): Unable to Assess Orientation: : (unable to assess, patient on vent) Alcohol / Substance Use: Not Applicable Psych Involvement: No (comment)  Admission diagnosis:  Bilateral pneumonia [J18.9] Patient Active Problem List   Diagnosis Date Noted  . Goals of care, counseling/discussion   . Palliative care by specialist   . Sepsis with acute organ dysfunction without septic shock ([place])   . Bilateral pneumonia 08/12/2018  . Acute respiratory failure with hypoxia ([place])   . CVA (cerebral vascular accident) ([place]) 04/19/2018  . Chronic respiratory failure ([place]) 04/19/2018  . Acute encephalopathy 04/18/2018  . E-coli UTI, ESBL  06/30/2017  . A-fib ([place]) 06/30/2017  . Need for protective airway ventilation 06/30/2017  . Seizures ([place]) 06/26/2017  . Acute renal failure ([place])   . Elevated troponin I level   . Acute renal failure with tubular necrosis ([place])   . Palliative care encounter   . Pressure ulcer, stage 4 ([place]) 02/17/2017  . Bilateral lower extremity edema 10/08/2016  . Venous ulcer of ankle ([place]) 07/08/2016  . DVT (deep venous thrombosis) ([place]) 07/02/2016  . Swelling of limb 06/08/2016  . Pain in limb 06/08/2016  . Altered mental status 01/22/2016  . S/P coronary artery stent placement 10/30/2015  . Preop cardiovascular exam 10/08/2015  . SOB (shortness of breath) on exertion 10/08/2015  . 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 Mass of lower lobe of left lung, incidetnal on CT 09/2015, smoker.  09/29/2015  . Other disorders of lung 09/29/2015  . Other microscopic hematuria 08/26/2015  . Kidney stone 08/26/2015  . Urge incontinence 08/26/2015  . Pneumonia 01/24/2015  . PNA (pneumonia) 01/24/2015  .  Essential (primary) hypertension 12/09/2014  . Adnexal mass 11/28/2013  . History of cardiac catheterization 11/28/2013  . Hyperlipidemia 11/28/2013  . Other specified postprocedural states 11/28/2013   PCP:  [name], MD Pharmacy:   [facility] [address], [place] [address] Phone: [phone] Fax: [phone]     Social Determinants of Health (SDOH) Interventions    Readmission Risk Interventions No flowsheet data found.

## 2018-08-16 ENCOUNTER — Inpatient Hospital Stay (HOSPITAL_COMMUNITY): Payer: Medicare Other

## 2018-08-16 DIAGNOSIS — J156 Pneumonia due to other aerobic Gram-negative bacteria: Secondary | ICD-10-CM

## 2018-08-16 DIAGNOSIS — J9610 Chronic respiratory failure, unspecified whether with hypoxia or hypercapnia: Secondary | ICD-10-CM

## 2018-08-16 LAB — MAGNESIUM: Magnesium: 2.5 mg/dL — ABNORMAL HIGH (ref 1.7–2.4)

## 2018-08-16 LAB — CBC WITH DIFFERENTIAL/PLATELET
Abs Immature Granulocytes: 0.13 10*3/uL — ABNORMAL HIGH (ref 0.00–0.07)
Basophils Absolute: 0.1 10*3/uL (ref 0.0–0.1)
Basophils Relative: 0 %
Eosinophils Absolute: 0.4 10*3/uL (ref 0.0–0.5)
Eosinophils Relative: 3 %
HCT: 28.4 % — ABNORMAL LOW (ref 36.0–46.0)
Hemoglobin: 7.4 g/dL — ABNORMAL LOW (ref 12.0–15.0)
Immature Granulocytes: 1 %
Lymphocytes Relative: 13 %
Lymphs Abs: 1.7 10*3/uL (ref 0.7–4.0)
MCH: 23.9 pg — ABNORMAL LOW (ref 26.0–34.0)
MCHC: 26.1 g/dL — ABNORMAL LOW (ref 30.0–36.0)
MCV: 91.9 fL (ref 80.0–100.0)
Monocytes Absolute: 1.1 10*3/uL — ABNORMAL HIGH (ref 0.1–1.0)
Monocytes Relative: 8 %
Neutro Abs: 10.1 10*3/uL — ABNORMAL HIGH (ref 1.7–7.7)
Neutrophils Relative %: 75 %
Platelets: 201 10*3/uL (ref 150–400)
RBC: 3.09 MIL/uL — ABNORMAL LOW (ref 3.87–5.11)
RDW: 17.4 % — ABNORMAL HIGH (ref 11.5–15.5)
WBC: 13.4 10*3/uL — ABNORMAL HIGH (ref 4.0–10.5)
nRBC: 0.3 % — ABNORMAL HIGH (ref 0.0–0.2)

## 2018-08-16 LAB — BASIC METABOLIC PANEL
Anion gap: 8 (ref 5–15)
BUN: 71 mg/dL — ABNORMAL HIGH (ref 8–23)
CO2: 21 mmol/L — ABNORMAL LOW (ref 22–32)
Calcium: 8.9 mg/dL (ref 8.9–10.3)
Chloride: 122 mmol/L — ABNORMAL HIGH (ref 98–111)
Creatinine, Ser: 0.58 mg/dL (ref 0.44–1.00)
GFR calc Af Amer: >60 mL/min (ref >60)
GFR calc non Af Amer: >60 mL/min (ref >60)
Glucose, Bld: 114 mg/dL — ABNORMAL HIGH (ref 70–99)
Potassium: 3.8 mmol/L (ref 3.5–5.1)
Sodium: 151 mmol/L — ABNORMAL HIGH (ref 135–145)

## 2018-08-16 LAB — GLUCOSE, CAPILLARY
Glucose-Capillary: 109 mg/dL — ABNORMAL HIGH (ref 70–99)
Glucose-Capillary: 92 mg/dL (ref 70–99)
Glucose-Capillary: 117 mg/dL — ABNORMAL HIGH (ref 70–99)
Glucose-Capillary: 82 mg/dL (ref 70–99)
Glucose-Capillary: 102 mg/dL — ABNORMAL HIGH (ref 70–99)
Glucose-Capillary: 111 mg/dL — ABNORMAL HIGH (ref 70–99)

## 2018-08-16 LAB — PHOSPHORUS: Phosphorus: 2.3 mg/dL — ABNORMAL LOW (ref 2.5–4.6)

## 2018-08-16 MED ORDER — MORPHINE SULFATE 15 MG PO TABS
7.5000 mg | ORAL_TABLET | Freq: Four times a day (QID) | ORAL | Status: DC | PRN
  Administered 2018-08-16: 7.5 mg
  Filled 2018-08-16: qty 1

## 2018-08-16 MED ORDER — FUROSEMIDE 10 MG/ML IJ SOLN
40.0000 mg | Freq: Two times a day (BID) | INTRAMUSCULAR | Status: AC
  Administered 2018-08-16 – 2018-08-17 (×2): 40 mg via INTRAVENOUS
  Filled 2018-08-16: qty 4

## 2018-08-16 MED ORDER — OSMOLITE 1.5 CAL PO LIQD
1000.0000 mL | ORAL | Status: DC
  Administered 2018-08-16 – 2018-08-25 (×7): 1000 mL
  Filled 2018-08-16 (×10): qty 1000

## 2018-08-16 MED ORDER — POTASSIUM CHLORIDE 20 MEQ/15ML (10%) PO SOLN
20.0000 meq | Freq: Once | ORAL | Status: AC
  Administered 2018-08-16: 18:00:00 20 meq
  Filled 2018-08-16: qty 15

## 2018-08-16 MED ORDER — FENTANYL CITRATE (PF) 100 MCG/2ML IJ SOLN
INTRAMUSCULAR | Status: AC
  Filled 2018-08-16: qty 2

## 2018-08-16 MED ORDER — SODIUM CHLORIDE 0.9 % IV SOLN
2.0000 g | Freq: Three times a day (TID) | INTRAVENOUS | Status: DC
  Administered 2018-08-16 – 2018-08-17 (×2): 2 g via INTRAVENOUS
  Filled 2018-08-16 (×5): qty 2

## 2018-08-16 MED ORDER — POTASSIUM CHLORIDE 20 MEQ/15ML (10%) PO SOLN
40.0000 meq | Freq: Once | ORAL | Status: AC
  Administered 2018-08-16: 12:00:00 40 meq
  Filled 2018-08-16: qty 30

## 2018-08-16 MED ORDER — IPRATROPIUM-ALBUTEROL 0.5-2.5 (3) MG/3ML IN SOLN
3.0000 mL | RESPIRATORY_TRACT | Status: DC
  Administered 2018-08-16 – 2018-08-17 (×6): 3 mL via RESPIRATORY_TRACT
  Filled 2018-08-16 (×6): qty 3

## 2018-08-16 NOTE — Procedures (Signed)
Bronchoscopy Procedure Note Melissa Cochran [id number] [birth year]  Procedure: Bronchoscopy Indications: Remove secretions Sudden desaturation with copious secretions requiring bag lavage x 2  Procedure Details Consent: Risks of procedure as well as the alternatives and risks of each were explained to the (patient/caregiver).  Consent for procedure obtained. -emergent Time Out: Verified patient identification, verified procedure, site/side was marked, verified correct patient position, special equipment/implants available, medications/allergies/relevent history reviewed, required imaging and test results available.  Performed  In preparation for procedure, patient was given 100% FiO2 and bronchoscope lubricated. Sedation: none  Airway entered and the following bronchi were examined: Bronchi.   Procedures performed: Lavage of both LL , thick white secretions suctioned out Bronchoscope removed.  , Patient placed back on 100% FiO2 at conclusion of procedure.    Evaluation Hemodynamic Status: BP stable throughout; O2 sats: stable throughout Patient's Current Condition: stable Specimens:  None Complications: No apparent complications Patient did tolerate procedure well.   Leanna Sato Elsworth Soho 08/16/2018

## 2018-08-16 NOTE — Progress Notes (Signed)
Nutrition Follow-up  DOCUMENTATION CODES:   Not applicable  INTERVENTION:   Tube Feeding:  Change to Osmolite 1.5 at 40 ml/hr Pro-Stat 60 mL BID Provides 120 g of protein, 1840 kcals and 730 mL of free water  Continue Juven BID, each packet provides 80 calories, 8 grams of carbohydrate, 2.5  grams of protein (collagen), 7 grams of L-arginine and 7 grams of L-glutamine; supplement contains CaHMB, Vitamins C, E, B12 and Zinc to promote wound healing  NUTRITION DIAGNOSIS:   Increased nutrient needs related to wound healing as evidenced by estimated needs.  Being addressed  GOAL:   Patient will meet greater than or equal to 90% of their needs  Met  MONITOR:   Vent status, TF tolerance, Labs, Skin, I & O's  REASON FOR ASSESSMENT:   Ventilator, Consult Enteral/tube feeding initiation and management  ASSESSMENT:   83 yo female with PMH of trach, vent dependence, PEG, HTN, HLD, heart block, CHF, CAD, COPD, CVA, and seizures who was admitted from [facility] with fever. R/O COVID-19.   Spoke with RN today who indicates a lot of "pressure" when attempting to "bolus" the feeding. RN indicates that when she uncaps the tube that lots of liquid comes back out. Abdomen is distended, although RN indicates it has been this way since admission. If distention and pressure persists, recommend considering abdominal xray   Free water 300 mL q 4 hours on top of volume from bolus feeding. Given high total volume via G-tube at present time; may be benficial to switch patient back to continuous feedings interi  Labs: sodium 151 (H),  Creatinine wdl, phosphorus 2.3 (L) Meds: D5 at 30 ml/hr, lasix, KCL   Diet Order:   Diet Order            Diet NPO time specified  Diet effective now              EDUCATION NEEDS:   No education needs have been identified at this time  Skin:  Skin Assessment: Skin Integrity Issues: Skin Integrity Issues:: Other (Comment), Unstageable, DTI, Stage IV DTI: L  leg Stage IV: sacrum, vertebral column Unstageable: L heel, R heel Other: non pressure wound to L ankle  Last BM:  4/7  Height:   Ht Readings from Last 1 Encounters:  08/12/18 _0  (1.626 m)    Weight:   Wt Readings from Last 1 Encounters:  08/12/18 79.3 kg    Ideal Body Weight:  54.5 kg  BMI:  Body mass index is 30 kg/m.  Estimated Nutritional Needs:   Kcal:  1750  Protein:  120-140 gm  Fluid:  1.8 L   Kerman Passey MS, RD, LDN, CNSC [phone] Pager  [phone] Weekend/On-Call Pager

## 2018-08-16 NOTE — Progress Notes (Signed)
Pharmacy Antibiotic Note  Melissa Cochran is a 83 y.o. female admitted on 08/12/2018 with HCAP. She was on vancomycin/zosyn at [facility] ([place] zosyn). CXR showing bilateral infiltrates- trach cx growing GNR/GPR at [facility]. Recent trach aspirate on 06/13/2018 for Acinetobacter (S imipenem). LA 2.1 downtrended to 1.9, WBC up to 13.4, patient is now febrile Tmax 102.6.   Plan: Given acinetobacter growth in cultures and concerns for potential resistance and history, increase meropenem 2 g IV every 8 hours Monitor renal fx, clinical pic, cx results   Recent Labs  Lab 08/12/18 1856 08/12/18 2037 08/13/18 0051 08/13/18 0259 08/14/18 0701 08/15/18 0330 08/16/18 0312  WBC 11.4*  --   --  11.1* 11.8* 10.9* 13.4*  CREATININE 1.03*  --   --  1.04* 0.87 0.68 0.58  LATICACIDVEN 2.1* 2.2*  --  1.9  --   --   --   VANCOTROUGH  --  31*  --   --   --   --   --   VANCORANDOM  --   --  26  --   --   --   --      Antimicrobials this admission: Meropenem 4/4 >>  Vancomycin 4/4>> 4/7  Dose adjustments this admission: N/A  Microbiology results: 4/7 TA: Acinetobacter Baumannii 4/4 BCx: NGx4 days 4/4 UCx: NG 4/4 COVID: negative  4/4 viral respiratory: negative    Claiborne Billings, PharmD PGY2 Cardiology Pharmacy Resident Please check AMION for all Pharmacist numbers by unit 08/16/2018 3:18 PM

## 2018-08-16 NOTE — Progress Notes (Signed)
Name: Melissa Cochran MRN: [id number] DOB: 1935-10-20 CC: SOB   LOS: 4  Referring Provider:  DR.Mecariello Reason for Referral:  HYPOTENSION  CRITICAL CARE MEDICINE  Brief History   83 year old woman transferred from [place] with fever 102.5, hx of chronic respiratory failure, tracheostomy and chronic ventilator, end stage dementia, PEG dependent, atrial fibrillation on apixaban, HTN, CAD, s/p Stent 5009, Chronic systolic CHF EF 38-18% 2/99/37, nephrolithiasis complicated by hydronephrosis s/p stent placement in 2017, CVA, seizure disorder.   Admitted 4/4 with fever and hypoxemia, septic shock requiring pressors.  Off pressors 4/7 with ongoing fevers.    Blood cx 4/4 >> ngtd Urine cx 4/4 >> negative Trach asp 4/7 >> GNR >> RVP neg 4/4 COVID >> neg  4/4 Meropenem >> 4/6 vanc >> 4/7   SUBJECTIVE: Ongoing fevers, tmax 102.4 slightly increased WBC 10.9 -> 13.4 Copious secretions, required episode of BVM for desaturation  Vital Signs: Temp:  [100.2 F (37.9 C)-102.4 F (39.1 C)] 101.3 F (38.5 C) (04/08 0600) Pulse Rate:  [88-104] 102 (04/08 0725) Resp:  [18-31] 22 (04/08 0600) BP: (90-133)/(50-95) 99/57 (04/08 0725) SpO2:  [96 %-100 %] 99 % (04/08 0725) FiO2 (%):  [40 %] 40 % (04/08 0725)  Physical Examination: General:  Chronically ill appearing female on MV HEENT: MM pink/moist, pupils 4/reactive, anicteric, midline trach shiley 8 cuffed Neuro: does not f/c or open eyes, no spont movement CV: SR, +1 pulses PULM: tachypneic over MV, lungs bilaterally rhonchi/ wheeze , copious secretions, foul smelling GI: Gtube, +bs, rectal tube with liquid stool Extremities: warm/dry, anasarca +2, contracted extremities Skin: no rashes    Active Problems:   Pressure ulcer, stage 4 (HCC)   Bilateral pneumonia   Goals of care, counseling/discussion   Palliative care by specialist   Sepsis with acute organ dysfunction without septic shock ([place])   ASSESSMENT AND  PLAN  PULMONARY Recent Labs  Lab 08/12/18 2059 08/14/18 0337  PHART 7.443 7.492*  PCO2ART 54.6* 35.8  PO2ART 100.0 108.0  HCO3 36.6* 27.3  O2SAT 97.0 99.0   Ventilator Settings: Vent Mode: PRVC FiO2 (%):  [40 %] 40 % Set Rate:  [20 bmp] 20 bmp Vt Set:  [490 mL] 490 mL PEEP:  [5 cmH20] 5 cmH20 Plateau Pressure:  [24 cmH20-32 cmH20] 27 cmH20   Acute on chronic resp failure with hypoxemia secondary to HCAP- GNR on sputum cx  Hx of MDR acinetobacter - CXR 4/8, stable, slight improved aeration in bases  P:  Chronic MV-currently on PRVC 8 cc/kg, rate / trach / PEG Add duonebs q 4 Follow sputum cx  Continue meropenum  Trend WBC/ fever curve/ PCT  Add back home MSIR 7.5 mg per tube q 6 prn dyspnea  Tylenol prn fever   Septic shock- resolved - PICC out, only peripheral IVs P:  Goal MAP > 65  Hypernatremia - free water deficit 2590 P:  Decrease D5W to 30 ml/hr Free water 300 ml q 4 hrs Trend BMP  End stage dementia - family has unrealistic expectations of recovery. She is bedbound with trach and peg.  P:  Appreciate Palliative care assistance, needs GOC discussion  Continue TFs Continue valium 2.5 mg, vicodin 1 tab q 8hr,   Seizure Disorder P:  Continue home keppra / valproic acid   Afib on eliquis  P:  eliquis  strongly consider stopping this, see no benefit  Systolic HF 16-96% 7/89/38 - net +6.4 L  P:  Restart home coreg if pressure is still ok  after diuresis today  Lasix 40 mg x 2 w/ KCL   BEST PRACTICE / DISPOSITION Code Status:  FULL Diet:  TF DVT Px:  eliquis GI Px:  PPI  Global: Ongoing GOC discussion with family per PMT.  She is chronic vent/ will not wean.  Continue antibiotic therapy and follow trach asp culture.  When she is no longer febrile, can transfer back to [facility].  PCCM can follow for trach/ MV care.  As she is stable, will transfer primary care back to [facility].    Kennieth Rad, MSN, AGACNP-BC [place] [facility] Pgr: [phone] or if no answer [phone] 08/16/2018, 9:41 AM

## 2018-08-16 NOTE — Progress Notes (Signed)
Dr. Elsworth Soho made aware of patient temp. He's "not concerned unless it hits 104."

## 2018-08-16 NOTE — Progress Notes (Signed)
Family Medicine Teaching Service Daily Progress Note Intern Pager: [phone]  Patient name: Melissa Cochran Medical record number: [id number] Date of birth: [birth year] Age: 83 y.o. Gender: female  Primary Care Provider: [name], MD Consultants: CCM Code Status: Full  Pt Overview and Major Events to Date:  4/4 admitted to fpts, transferred to Ccm 4/7 weaned off pressors 4/8 FPTS assumes care, has bronch 2/2 mucous plug  Assessment and Plan: 83 year old chronically trach-vent dependent patient admitted for bilateral PNA and possible UTI. Continuing to fever despite vanc and meropenem. Had mucous plug leading to acute desat, prompting bronchoscopy from ccm.  Acute hypoxic respiratory failure/ H/O trach vent Patient on trach vent. Managed by CCM. Respiratory cultures growing acinetobacter with susceptibilities to follow. Will continue vanc and mero for additional 24 hours. As patient is still febrile with these meds there is unclear benefit at this time. Could consider stopping these meds and monitoring . There exists the possibility her symptoms are cause by viral infection such as CoViD-19 despite the negative test, given the possibilities for false negatives. Family with unrealistic goals of care, appreciate palliative care recs - vital signs per icu routine - ccm managing vent, appreciate recs - monitor tracheal aspirate - continue duonebs 31mL q 4 hours - continue morphine 7.5mg  per tube - valium 2.5mg  bid   Hypernatremia Na 151 from 158 on 4/8. Improving on free water 315mL per tube.  Seizure Well controlled on valporic acid and keppra. - continue valproic acid 250mg  bid  - continue keppra 1g bid  End-Stage Dementia Patient can only move eyes at baseline. Bedbound with trach and peg. Family unrealistic about goals of care. Continues to be full code. [facility] afebrile. - continue tube feeds - continue valium 2.5mg  bid - continue MSIR  7.5  Atrial fibrillation Continue eliquis 5mg  bid. HR 102- 115 last 24 hours.  Systolic heart failure EF 45-50%. Net +6.4L. Will hold lasix and coreg in setting of blood pressures.  Hypotension Hypotensive during admission, requiring pressors earlier in admit. BP 48-18 systolic last 24 hours. Will hold antihypertensives and monitor closely. Hesitant to give any fluids at this point given that she is net positive +5.6D and has systolic heart failure - appreciate CCM recs  FEN/GI: tube feeds with osmolite, Prostat PPx: eliquis 5mg  bid, famotidine 20mg   Disposition: likely [facility] to [facility]  Subjective:  Stable this am. Cannot cooperate with exam  Objective: Temp:  [100 F (37.8 C)-102.6 F (39.2 C)] 100 F (37.8 C) (04/08 1315) Pulse Rate:  [88-116] 104 (04/08 1315) Resp:  [18-38] 26 (04/08 1315) BP: (82-133)/(46-93) 85/47 (04/08 1315) SpO2:  [73 %-100 %] 100 % (04/08 1315) FiO2 (%):  [40 %-80 %] 80 % (04/08 1125) Physical Exam: General: 83 year old, chronically ill appearing. On trach vent. Can only open eyes Cardiovascular: regular rhythm, tachycardia,   Respiratory: trach/vent in place, rhonchi bilaterally, thick secretions Abdomen: G-tube in place, rectal tube in place Extremities: warm, dry. Cap refill 2-3 seconds  Laboratory: Recent Labs  Lab 08/14/18 0701 08/15/18 0330 08/16/18 0312  WBC 11.8* 10.9* 13.4*  HGB 7.8* 8.1* 7.4*  HCT 29.0* 30.7* 28.4*  PLT 216 188 201   Recent Labs  Lab 08/12/18 1856  08/14/18 0701 08/15/18 0330 08/16/18 0312  NA 151*   < > 157* 158* 151*  K 4.5   < > 3.4* 4.0 3.8  CL 111   < > 120* 123* 122*  CO2 30   < >  27 26 21*  BUN 92*   < > 87* 77* 71*  CREATININE 1.03*   < > 0.87 0.68 0.58  CALCIUM 9.5   < > 9.3 9.1 8.9  PROT 6.4*  --   --   --   --   BILITOT 0.9  --   --   --   --   ALKPHOS 63  --   --   --   --   ALT 18  --   --   --   --   AST 17  --   --   --   --   GLUCOSE 155*   < > 132* 125* 114*   < > = values in this  interval not displayed.   Imaging/Diagnostic Tests: CLINICAL DATA:  Acute respiratory failure  EXAM: PORTABLE CHEST 1 VIEW  COMPARISON:  08/15/2018  FINDINGS: Cardiac shadow is stable in appearance. Aortic calcifications are again noted. Tracheostomy tube is noted in satisfactory position. Decrease in the degree of vascular congestion is noted when compared with the prior exam. Small pleural effusions are again identified bilaterally. There is some improved aeration in the bases bilaterally although persistent interstitial opacities remain.  IMPRESSION: Slight improved aeration in the bases bilaterally.  Electronically Signed   By: Inez Catalina M.D.   On: 08/16/2018 08:07  Guadalupe Dawn, MD 08/16/2018, 1:45 PM PGY-2, [place] Intern pager: [phone], text pages welcome

## 2018-08-17 ENCOUNTER — Inpatient Hospital Stay (HOSPITAL_COMMUNITY): Payer: Medicare Other

## 2018-08-17 ENCOUNTER — Inpatient Hospital Stay: Payer: Self-pay

## 2018-08-17 LAB — GLUCOSE, CAPILLARY
Glucose-Capillary: 103 mg/dL — ABNORMAL HIGH (ref 70–99)
Glucose-Capillary: 112 mg/dL — ABNORMAL HIGH (ref 70–99)
Glucose-Capillary: 127 mg/dL — ABNORMAL HIGH (ref 70–99)
Glucose-Capillary: 83 mg/dL (ref 70–99)
Glucose-Capillary: 85 mg/dL (ref 70–99)
Glucose-Capillary: 98 mg/dL (ref 70–99)

## 2018-08-17 LAB — CBC WITH DIFFERENTIAL/PLATELET
Abs Immature Granulocytes: 0.3 10*3/uL — ABNORMAL HIGH (ref 0.00–0.07)
Basophils Absolute: 0.1 10*3/uL (ref 0.0–0.1)
Basophils Relative: 0 %
Eosinophils Absolute: 0.5 10*3/uL (ref 0.0–0.5)
Eosinophils Relative: 2 %
HCT: 29.6 % — ABNORMAL LOW (ref 36.0–46.0)
Hemoglobin: 7.8 g/dL — ABNORMAL LOW (ref 12.0–15.0)
Immature Granulocytes: 2 %
Lymphocytes Relative: 11 %
Lymphs Abs: 2.1 10*3/uL (ref 0.7–4.0)
MCH: 23.8 pg — ABNORMAL LOW (ref 26.0–34.0)
MCHC: 26.4 g/dL — ABNORMAL LOW (ref 30.0–36.0)
MCV: 90.2 fL (ref 80.0–100.0)
Monocytes Absolute: 0.8 10*3/uL (ref 0.1–1.0)
Monocytes Relative: 4 %
Neutro Abs: 15.8 10*3/uL — ABNORMAL HIGH (ref 1.7–7.7)
Neutrophils Relative %: 81 %
Platelets: 305 10*3/uL (ref 150–400)
RBC: 3.28 MIL/uL — ABNORMAL LOW (ref 3.87–5.11)
RDW: 17.4 % — ABNORMAL HIGH (ref 11.5–15.5)
WBC: 19.5 10*3/uL — ABNORMAL HIGH (ref 4.0–10.5)
nRBC: 0.3 % — ABNORMAL HIGH (ref 0.0–0.2)

## 2018-08-17 LAB — RENAL FUNCTION PANEL
Albumin: 1.7 g/dL — ABNORMAL LOW (ref 3.5–5.0)
Anion gap: 13 (ref 5–15)
BUN: 59 mg/dL — ABNORMAL HIGH (ref 8–23)
CO2: 23 mmol/L (ref 22–32)
Calcium: 9.2 mg/dL (ref 8.9–10.3)
Chloride: 115 mmol/L — ABNORMAL HIGH (ref 98–111)
Creatinine, Ser: 0.52 mg/dL (ref 0.44–1.00)
GFR calc Af Amer: >60 mL/min (ref >60)
GFR calc non Af Amer: >60 mL/min (ref >60)
Glucose, Bld: 116 mg/dL — ABNORMAL HIGH (ref 70–99)
Phosphorus: 2.1 mg/dL — ABNORMAL LOW (ref 2.5–4.6)
Potassium: 4 mmol/L (ref 3.5–5.1)
Sodium: 151 mmol/L — ABNORMAL HIGH (ref 135–145)

## 2018-08-17 LAB — CULTURE, BLOOD (ROUTINE X 2)
Culture: NO GROWTH
Culture: NO GROWTH
Special Requests: ADEQUATE
Special Requests: ADEQUATE

## 2018-08-17 LAB — MAGNESIUM: Magnesium: 2.2 mg/dL (ref 1.7–2.4)

## 2018-08-17 MED ORDER — LEVALBUTEROL HCL 0.63 MG/3ML IN NEBU
0.6300 mg | INHALATION_SOLUTION | Freq: Four times a day (QID) | RESPIRATORY_TRACT | Status: DC
  Administered 2018-08-17 – 2018-08-18 (×6): 0.63 mg via RESPIRATORY_TRACT
  Filled 2018-08-17 (×7): qty 3

## 2018-08-17 MED ORDER — POTASSIUM & SODIUM PHOSPHATES 280-160-250 MG PO PACK
2.0000 | PACK | Freq: Three times a day (TID) | ORAL | Status: AC
  Administered 2018-08-17 (×3): 2
  Filled 2018-08-17 (×4): qty 2

## 2018-08-17 MED ORDER — DIAZEPAM 5 MG PO TABS: 2.5000 mg | ORAL_TABLET | Freq: Every day | ORAL | Status: DC

## 2018-08-17 MED ORDER — IPRATROPIUM BROMIDE 0.02 % IN SOLN
0.5000 mg | Freq: Four times a day (QID) | RESPIRATORY_TRACT | Status: DC
  Administered 2018-08-17 – 2018-08-18 (×6): 0.5 mg via RESPIRATORY_TRACT
  Filled 2018-08-17 (×6): qty 2.5

## 2018-08-17 MED ORDER — SODIUM CHLORIDE 0.9 % IV SOLN
3.0000 g | Freq: Four times a day (QID) | INTRAVENOUS | Status: AC
  Administered 2018-08-17 – 2018-08-24 (×28): 3 g via INTRAVENOUS
  Filled 2018-08-17 (×28): qty 3

## 2018-08-17 MED ORDER — METOPROLOL TARTRATE 5 MG/5ML IV SOLN
2.5000 mg | INTRAVENOUS | Status: DC | PRN
  Administered 2018-08-17 (×3): 2.5 mg via INTRAVENOUS
  Filled 2018-08-17: qty 5

## 2018-08-17 MED ORDER — SODIUM CHLORIDE 0.9% FLUSH: 10.0000 mL | INTRAVENOUS | Status: DC | PRN

## 2018-08-17 NOTE — Progress Notes (Addendum)
Assisted tele visit with patient and her daughter.

## 2018-08-17 NOTE — Progress Notes (Signed)
Pharmacy Antibiotic Note  Melissa Cochran is a 83 y.o. female admitted on 08/12/2018 with HCAP. She was on vancomycin/zosyn at [facility] ([place] zosyn). CXR showing bilateral infiltrates- trach cx growing GNR/GPR at [facility]. Recent trach aspirate on 06/13/2018 for Acinetobacter (S imipenem). LA 2.1 downtrended to 1.9, WBC up to 19.5, patient is febrile Tmax 102.6. She has been on meropenem.  Susceptibilities have resulted, Acinetobacter is resistant to imipenem, only sensitive to Unasyn. Will transition to Unasyn.  Plan: Start Unasyn 3g IV q6h Monitor renal fx, clinical pic   Recent Labs  Lab 08/12/18 1856 08/12/18 2037 08/13/18 0051 08/13/18 0259 08/14/18 0701 08/15/18 0330 08/16/18 0312 08/17/18 0439  WBC 11.4*  --   --  11.1* 11.8* 10.9* 13.4* 19.5*  CREATININE 1.03*  --   --  1.04* 0.87 0.68 0.58 0.52  LATICACIDVEN 2.1* 2.2*  --  1.9  --   --   --   --   VANCOTROUGH  --  31*  --   --   --   --   --   --   VANCORANDOM  --   --  26  --   --   --   --   --      Antimicrobials this admission: Unasyn 4/9 >> Meropenem 4/4 >> 4/9 Vancomycin 4/4>> 4/7  Dose adjustments this admission: N/A  Microbiology results: 4/7 TA: Acinetobacter Baumannii; S to Unasyn (R to imipenem) 4/4 BCx: NGTD 4/4 UCx: NG 4/4 COVID: negative  4/4 viral respiratory: negative    Claiborne Billings, PharmD PGY2 Cardiology Pharmacy Resident Please check AMION for all Pharmacist numbers by unit 08/17/2018 7:43 AM

## 2018-08-17 NOTE — Progress Notes (Signed)
Family Medicine Social Note  Patient back on levophed and under care of CCM tea. Appreciate their help, FPTS will be available to pick up patient when off pressors and stable for stepdown status.  Guadalupe Dawn MD PGY-2 Family Medicine Resident

## 2018-08-17 NOTE — Progress Notes (Signed)
Peripherally Inserted Central Catheter/Midline Placement  The IV Nurse has discussed with the patient and/or persons authorized to consent for the patient, the purpose of this procedure and the potential benefits and risks involved with this procedure.  The benefits include less needle sticks, lab draws from the catheter, and the patient may be discharged home with the catheter. Risks include, but not limited to, infection, bleeding, blood clot (thrombus formation), and puncture of an artery; nerve damage and irregular heartbeat and possibility to perform a PICC exchange if needed/ordered by physician.  Alternatives to this procedure were also discussed.  Bard Power PICC patient education guide, fact sheet on infection prevention and patient information card has been provided to patient /or left at bedside.    PICC/Midline Placement Documentation  PICC Single Lumen 06/10/18 PICC Right (Active)     PICC Double Lumen 64/15/83 PICC Left Cephalic 44 cm 0 cm (Active)  Indication for Insertion or Continuance of Line Poor Vasculature-patient has had multiple peripheral attempts or PIVs lasting less than 24 hours 08/17/2018  5:00 PM  Exposed Catheter (cm) 0 cm 08/17/2018  5:00 PM  Site Assessment Clean;Dry;Intact 08/17/2018  5:00 PM  Lumen #1 Status Flushed;Blood return noted 08/17/2018  5:00 PM  Lumen #2 Status Flushed;Blood return noted 08/17/2018  5:00 PM  Dressing Type Transparent 08/17/2018  5:00 PM  Dressing Status Clean;Dry;Intact;Antimicrobial disc in place 08/17/2018  5:00 PM  Dressing Change Due 08/24/18 08/17/2018  5:00 PM       Jule Economy Horton 08/17/2018, 5:53 PM

## 2018-08-17 NOTE — Progress Notes (Signed)
Name: Melissa Cochran MRN: [id number] DOB: 1935-12-19 CC: SOB   LOS: 5  Referring Provider:  DR.Mecariello Reason for Referral:  HYPOTENSION  CRITICAL CARE MEDICINE  Brief History   83 year old woman transferred from [place] with fever 102.5, hx of chronic respiratory failure, tracheostomy and chronic ventilator, end stage dementia, PEG dependent, atrial fibrillation on apixaban, HTN, CAD, s/p Stent 6301, Chronic systolic CHF EF 60-10% 9/32/35, nephrolithiasis complicated by hydronephrosis s/p stent placement in 2017, CVA, seizure disorder.   Admitted 4/4 with fever and hypoxemia, septic shock requiring pressors.  Intermittently on pressors 4/7 with ongoing fevers.  Copious secretions, episode of mucus plugging requiring BVM, s/p bronch.     Blood cx 4/4 >> ngtd Urine cx 4/4 >> negative Trach asp 4/7 >> acinetobacter- resistant, only sensitive to unasyn  RVP neg 4/4 COVID >> neg  4/4 Meropenem >> 4/9 4/6 vanc >> 4/7 4/9 unasyn >>   SUBJECTIVE: tmax 101.7 Trach aspirate showing resistant acinetobacter Less secretions today Weaning levophed, currently at 5  Vital Signs: Temp:  [99.7 F (37.6 C)-102.6 F (39.2 C)] 101.5 F (38.6 C) (04/09 0830) Pulse Rate:  [92-127] 127 (04/09 0830) Resp:  [20-38] 30 (04/09 0830) BP: (77-149)/(40-81) 139/61 (04/09 0830) SpO2:  [73 %-100 %] 97 % (04/09 0830) FiO2 (%):  [40 %-80 %] 40 % (04/09 0731)  Physical Examination: General:  Acute on chronically ill elderly female on MV in NAD HEENT: MM pink/moist, pupils 4/reactive, midline 8 cuffed shiley Neuro: grimaces, no f/c, spon move extremities CV: Afib- higher rates noted today, no murmur PULM: even/non-labored, breathing over set rate, diffuse coarse/rhonchi GI: +bs, PEG, rectal tube Extremities: warm/dry, anasarca +1, contracted extremities  Skin: no rashes    Active Problems:   Pressure ulcer, stage 4 (HCC)   Bilateral pneumonia   Goals of care, counseling/discussion  Palliative care by specialist   Sepsis with acute organ dysfunction without septic shock ([place])   ASSESSMENT AND PLAN  PULMONARY Recent Labs  Lab 08/12/18 2059 08/14/18 0337  PHART 7.443 7.492*  PCO2ART 54.6* 35.8  PO2ART 100.0 108.0  HCO3 36.6* 27.3  O2SAT 97.0 99.0   Ventilator Settings: Vent Mode: PRVC FiO2 (%):  [40 %-80 %] 40 % Set Rate:  [20 bmp] 20 bmp Vt Set:  [490 mL] 490 mL PEEP:  [10 cmH20] 10 cmH20 Plateau Pressure:  [23 cmH20-30 cmH20] 23 cmH20   Acute on chronic resp failure with hypoxemia secondary to resistant acinetobacter HCAP Hx of MDR acinetobacter - no CXR 4/9 P:  Chronic MV-currently on PRVC 8 cc/kg, rate / trach / PEG Change duonebs to xopenex/ atrovent  D/c meropenum, changed to unasyn, appreciate pharm assistance Trend WBC/ fever curve Add back home MSIR 7.5 mg per tube q 6 prn dyspnea  Tylenol prn fever   Septic shock- resolved - PICC out for concern of sepsis, BC since neg, only peripheral IVs P:  Weaning levophed for goal MAP > 65 PICC consult for long term access  Hypernatremia P:  D/c D5W  Free water 300 ml q 4 hrs Trend BMP/ mag/ phos  End stage dementia - family has unrealistic expectations of recovery. She is bedbound with trach and peg.  P:  Appreciate Palliative care assistance- seen 4/5, daughter will not change code status despite quality of life, prolonging life is most important to family Continue TFs Continue valium 2.5 mg, vicodin 1 tab q 8hr  Seizure Disorder P:  Continue home keppra / valproic acid   Afib on eliquis  P:  eliquis  strongly consider stopping this, see no benefit  Systolic HF 62-03% 5/59/74 Hypervolemia - net +6L P:  Restart home coreg when off pressors Hold further diuresis while on levophed    BEST PRACTICE / DISPOSITION Code Status:  FULL Diet:  TF DVT Px:  eliquis GI Px:  PPI  CCT 45 mins  Kennieth Rad, MSN, AGACNP-BC [facility] Pgr: [phone] or if no  answer [phone] 08/17/2018, 8:43 AM

## 2018-08-18 LAB — GLUCOSE, CAPILLARY
Glucose-Capillary: 107 mg/dL — ABNORMAL HIGH (ref 70–99)
Glucose-Capillary: 110 mg/dL — ABNORMAL HIGH (ref 70–99)
Glucose-Capillary: 114 mg/dL — ABNORMAL HIGH (ref 70–99)
Glucose-Capillary: 120 mg/dL — ABNORMAL HIGH (ref 70–99)
Glucose-Capillary: 90 mg/dL (ref 70–99)
Glucose-Capillary: 98 mg/dL (ref 70–99)

## 2018-08-18 MED ORDER — FUROSEMIDE 10 MG/ML IJ SOLN
20.0000 mg | Freq: Once | INTRAMUSCULAR | Status: AC
  Administered 2018-08-18: 09:00:00 20 mg via INTRAVENOUS
  Filled 2018-08-18: qty 2

## 2018-08-18 MED ORDER — MIDODRINE HCL 5 MG PO TABS
5.0000 mg | ORAL_TABLET | Freq: Three times a day (TID) | ORAL | Status: DC
  Administered 2018-08-18 – 2018-08-21 (×9): 5 mg via ORAL
  Filled 2018-08-18 (×9): qty 1

## 2018-08-18 MED ORDER — DIAZEPAM 2 MG PO TABS: 2.0000 mg | ORAL_TABLET | Freq: Four times a day (QID) | ORAL | Status: DC | PRN

## 2018-08-18 MED ORDER — IPRATROPIUM BROMIDE 0.02 % IN SOLN
0.5000 mg | Freq: Four times a day (QID) | RESPIRATORY_TRACT | Status: DC
  Administered 2018-08-19 – 2018-08-25 (×27): 0.5 mg via RESPIRATORY_TRACT
  Filled 2018-08-18 (×27): qty 2.5

## 2018-08-18 MED ORDER — LEVALBUTEROL HCL 0.63 MG/3ML IN NEBU
0.6300 mg | INHALATION_SOLUTION | Freq: Four times a day (QID) | RESPIRATORY_TRACT | Status: DC
  Administered 2018-08-19 – 2018-08-25 (×27): 0.63 mg via RESPIRATORY_TRACT
  Filled 2018-08-18 (×27): qty 3

## 2018-08-18 NOTE — Progress Notes (Signed)
Family Medicine Social Note  Patient back on levophed and under care of CCM team. Appreciate their help, FPTS will be available to pick up patient when off pressors and stable for stepdown status.  Guadalupe Dawn MD PGY-2 Family Medicine Resident

## 2018-08-18 NOTE — Progress Notes (Signed)
NAME:  Melissa Cochran, MRN:  [id number], DOB:  03-22-1936, LOS: 6 ADMISSION DATE:  08/12/2018, CONSULTATION DATE:  08/12/2018 REFERRING MD:  Tonia Ghent, CHIEF COMPLAINT:  Hypotension.   HPI/course in hospital  83 year old [place] resident with chronic ventilator dependent respiratory failure and advanced dementia.  Transferred for increased secretions and hypotension requiring vasopressors. Diagnosed with Acinetobacter pneumonia.  Past Medical History   Past Medical History:  Diagnosis Date  . Adnexal mass   . Anxiety   . Arthritis   . Bladder tumor   . Bleeding ulcer   . CHF (congestive heart failure) ([place])   . CHF (congestive heart failure) ([place])   . Chronic bronchitis ([place])   . Chronic respiratory failure ([place]) 04/19/2018  . COPD (chronic obstructive pulmonary disease) ([place])   . Coronary artery disease   . CVA (cerebral vascular accident) ([place]) 04/19/2018  . DVT (deep venous thrombosis) (HCC)    RLE  . GERD (gastroesophageal reflux disease)   . Heart block    left  . History of bleeding ulcers   . History of kidney stones   . Hyperlipemia   . Hypertension   . Myocardial infarction ([place])    "slight one" (07/05/2017)  . Pneumonia    "several times" (07/05/2017)  . Seizures ([place]) 02/2017 "several"; 06/26/2017 X 1  . Shortness of breath dyspnea   . Stroke [facility]) 02/13/2018   "still right sided weakness" (07/05/2017)     Past Surgical History:  Procedure Laterality Date  . ABDOMINAL HYSTERECTOMY    . CARDIAC CATHETERIZATION Left 10/21/2015   Procedure: Left Heart Cath and Coronary Angiography;  Surgeon: Isaias Cowman, MD;  Location: [place] CV LAB;  Service: Cardiovascular;  Laterality: Left;  . CARDIAC CATHETERIZATION N/A 10/21/2015   Procedure: Coronary Stent Intervention;  Surgeon: Isaias Cowman, MD;  Location: [place] CV LAB;  Service: Cardiovascular;  Laterality: N/A;  . CARDIAC CATHETERIZATION    . CYSTOSCOPY W/ RETROGRADES Bilateral  01/20/2016   Procedure: CYSTOSCOPY WITH RETROGRADE PYELOGRAM;  Surgeon: Hollice Espy, MD;  Location: [facility] ORS;  Service: Urology;  Laterality: Bilateral;  . CYSTOSCOPY WITH STENT PLACEMENT Right 01/20/2016   Procedure: CYSTOSCOPY WITH STENT PLACEMENT;  Surgeon: Hollice Espy, MD;  Location: [facility] ORS;  Service: Urology;  Laterality: Right;  . HIP FRACTURE SURGERY Left   . STOMACH SURGERY     "stomach ulcers"  . TRACHEOSTOMY TUBE PLACEMENT N/A 03/02/2017   Procedure: TRACHEOSTOMY;  Surgeon: Rozetta Nunnery, MD;  Location: [place];  Service: ENT;  Laterality: N/A;  . TRANSURETHRAL RESECTION OF BLADDER TUMOR N/A 01/20/2016   Procedure: TRANSURETHRAL RESECTION OF BLADDER TUMOR (TURBT);  Surgeon: Hollice Espy, MD;  Location: [facility] ORS;  Service: Urology;  Laterality: N/A;      Interim history/subjective:  Neurologic status unchanged.  Afebrile for last 24 hours.  Objective   Blood pressure (!) 103/54, pulse (!) 102, temperature 99.3 F (37.4 C), resp. rate (!) 26, height 5\' 4"  (1.626 m), weight 80.8 kg, SpO2 99 %.    Vent Mode: PRVC FiO2 (%):  [40 %] 40 % Set Rate:  [20 bmp] 20 bmp Vt Set:  [490 mL] 490 mL PEEP:  [5 cmH20] 5 cmH20 Plateau Pressure:  [19 cmH20-26 cmH20] 23 cmH20   Intake/Output Summary (Last 24 hours) at 08/18/2018 1302 Last data filed at 08/18/2018 1146 Gross per 24 hour  Intake 3683.96 ml  Output 2580 ml  Net 1103.96 ml   Filed Weights   08/12/18 1817 08/18/18 0500  Weight: 79.3 kg  80.8 kg    Examination: Physical Exam Constitutional:      Appearance: She is obese. She is ill-appearing.  HENT:     Head:     Comments: Marked rightward torticollis Neck:     Comments: Tracheostomy site intact. Cardiovascular:     Rate and Rhythm: Normal rate. Rhythm irregular.     Heart sounds: Normal heart sounds.  Pulmonary:     Breath sounds: Normal breath sounds.     Comments: Remains on baseline ventilator settings Abdominal:     General: Abdomen is flat.      Palpations: Abdomen is soft.     Comments: PEG tube site intact.  Musculoskeletal:        General: No swelling.  Skin:    General: Skin is dry.     Capillary Refill: Capillary refill takes 2 to 3 seconds.  Neurological:     Mental Status: Mental status is at baseline.      Ancillary tests (personally reviewed)  CBC: Recent Labs  Lab 08/13/18 0259 08/14/18 0337 08/14/18 0701 08/15/18 0330 08/16/18 0312 08/17/18 0439  WBC 11.1*  --  11.8* 10.9* 13.4* 19.5*  NEUTROABS 8.7*  --  9.3* 8.2* 10.1* 15.8*  HGB 8.1* 9.9* 7.8* 8.1* 7.4* 7.8*  HCT 31.1* 29.0* 29.0* 30.7* 28.4* 29.6*  MCV 92.3  --  89.8 91.1 91.9 90.2  PLT 186  --  216 188 201 824    Basic Metabolic Panel: Recent Labs  Lab 08/13/18 0259 08/14/18 0337 08/14/18 0701 08/15/18 0330 08/16/18 0312 08/17/18 0439  NA 157* 158* 157* 158* 151* 151*  K 4.0 3.4* 3.4* 4.0 3.8 4.0  CL 115*  --  120* 123* 122* 115*  CO2 34*  --  27 26 21* 23  GLUCOSE 111*  --  132* 125* 114* 116*  BUN 89*  --  87* 77* 71* 59*  CREATININE 1.04*  --  0.87 0.68 0.58 0.52  CALCIUM 9.8  --  9.3 9.1 8.9 9.2  MG  --   --  2.6*  --  2.5* 2.2  PHOS  --   --  1.9*  --  2.3* 2.1*   GFR: Estimated Creatinine Clearance: 56.7 mL/min (by C-G formula based on SCr of 0.52 mg/dL). Recent Labs  Lab 08/12/18 1856 08/12/18 2037 08/13/18 0051 08/13/18 0259 08/14/18 0701 08/15/18 0330 08/16/18 0312 08/17/18 0439  PROCALCITON  --   --  2.91 2.76 1.50  --   --   --   WBC 11.4*  --   --  11.1* 11.8* 10.9* 13.4* 19.5*  LATICACIDVEN 2.1* 2.2*  --  1.9  --   --   --   --     Liver Function Tests: Recent Labs  Lab 08/12/18 1856 08/17/18 0439  AST 17  --   ALT 18  --   ALKPHOS 63  --   BILITOT 0.9  --   PROT 6.4*  --   ALBUMIN 1.9* 1.7*   No results for input(s): LIPASE, AMYLASE in the last 168 hours. No results for input(s): AMMONIA in the last 168 hours.  ABG    Component Value Date/Time   PHART 7.492 (H) 08/14/2018 0337   PCO2ART 35.8  08/14/2018 0337   PO2ART 108.0 08/14/2018 0337   HCO3 27.3 08/14/2018 0337   TCO2 28 08/14/2018 0337   ACIDBASEDEF 2.0 06/26/2017 0210   O2SAT 99.0 08/14/2018 0337     Coagulation Profile: No results for input(s): INR, PROTIME in the last 168  hours.  Cardiac Enzymes: Recent Labs  Lab 08/13/18 0051 08/13/18 0259 08/13/18 1517  TROPONINI 0.10* 0.10* 0.09*    HbA1C: Hgb A1c MFr Bld  Date/Time Value Ref Range Status  02/13/2017 09:35 PM 5.1 4.8 - 5.6 % Final    Comment:    (NOTE) Pre diabetes:          5.7%-6.4% Diabetes:              >6.4% Glycemic control for   <7.0% adults with diabetes     CBG: Recent Labs  Lab 08/17/18 1951 08/17/18 2358 08/18/18 0321 08/18/18 0725 08/18/18 1125  GLUCAP 98 127* 120* 90 114*     Assessment & Plan:  Critically ill due to acute on chronic respiratory failure requiring mechanical ventilation Acinetobacter pneumonia Chronic ventilator dependency Hypernatremia Advanced dementia Seizure disorder  Plan: Continue full ventilatory support Complete 7-day course of ampicillin sulbactam for pneumonia Continue free water Change Valium to as needed Transfer back to [facility] once afebrile for 48 hours  Best practice:  Diet: Tube feed via PEG tube Pain/Anxiety/Delirium protocol (if indicated): Stop round-the-clock Valium VAP protocol (if indicated): Bundle in place DVT prophylaxis: On home Eliquis GI prophylaxis: Famotidine Urinary catheter: Pure wick Glucose control: Euglycemic without insulin coverage Mobility: Bed rest at baseline Code Status: Full code Family Communication: Family has been updated regularly.   Disposition: Transfer back to [facility].  Daughter has been resistant to any palliative care discussions.  Critical care time: 35 min including chart data review, examination of patient, multidisciplinary rounds, and frequent assessment and modification of ventilator settings.   Kipp Brood, MD [facility] ICU  Physician [place]  Pager: [phone] Mobile: [phone] After hours: [phone].  08/18/2018, 1:02 PM

## 2018-08-19 DIAGNOSIS — L89154 Pressure ulcer of sacral region, stage 4: Secondary | ICD-10-CM

## 2018-08-19 LAB — BASIC METABOLIC PANEL
Anion gap: 11 (ref 5–15)
BUN: 51 mg/dL — ABNORMAL HIGH (ref 8–23)
CO2: 28 mmol/L (ref 22–32)
Calcium: 9.1 mg/dL (ref 8.9–10.3)
Chloride: 116 mmol/L — ABNORMAL HIGH (ref 98–111)
Creatinine, Ser: 0.48 mg/dL (ref 0.44–1.00)
GFR calc Af Amer: >60 mL/min (ref >60)
GFR calc non Af Amer: >60 mL/min (ref >60)
Glucose, Bld: 111 mg/dL — ABNORMAL HIGH (ref 70–99)
Potassium: 3.7 mmol/L (ref 3.5–5.1)
Sodium: 155 mmol/L — ABNORMAL HIGH (ref 135–145)

## 2018-08-19 LAB — GLUCOSE, CAPILLARY
Glucose-Capillary: 81 mg/dL (ref 70–99)
Glucose-Capillary: 90 mg/dL (ref 70–99)
Glucose-Capillary: 107 mg/dL — ABNORMAL HIGH (ref 70–99)
Glucose-Capillary: 111 mg/dL — ABNORMAL HIGH (ref 70–99)
Glucose-Capillary: 96 mg/dL (ref 70–99)
Glucose-Capillary: 99 mg/dL (ref 70–99)

## 2018-08-19 LAB — PHOSPHORUS: Phosphorus: 2.5 mg/dL (ref 2.5–4.6)

## 2018-08-19 NOTE — Progress Notes (Signed)
Name: Melissa Cochran MRN: [id number] DOB: 17-Oct-1935 CC: SOB   LOS: 7  Referring Provider:  DR.Mecariello Reason for Referral:  HYPOTENSION  CRITICAL CARE MEDICINE  Brief History   83 year old woman transferred from [place] with fever 102.5, hx of chronic respiratory failure, tracheostomy and chronic ventilator, end stage dementia, PEG dependent, atrial fibrillation on apixaban, HTN, CAD, s/p Stent 5638, Chronic systolic CHF EF 75-64% 3/32/95, nephrolithiasis complicated by hydronephrosis s/p stent placement in 2017, CVA, seizure disorder.   Admitted 4/4 with fever and hypoxemia, septic shock requiring pressors.  Intermittently on pressors 4/7 with ongoing fevers.  Copious secretions, episode of mucus plugging requiring BVM, s/p bronch.     Blood cx 4/4 >> ngtd Urine cx 4/4 >> negative Trach asp 4/7 >> acinetobacter- resistant, only sensitive to unasyn  RVP neg 4/4 COVID >> neg  4/4 Meropenem >> 4/9 4/6 vanc >> 4/7 4/9 unasyn >>   SUBJECTIVE: Pressors are off Last fever was 4/10 at 1745 (101.29F) Stable mechanical ventilation settings  Vital Signs: Temp:  [97.9 F (36.6 C)-101.5 F (38.6 C)] 98.6 F (37 C) (04/11 1200) Pulse Rate:  [89-121] 105 (04/11 1200) Resp:  [16-29] 19 (04/11 1200) BP: (71-119)/(41-78) 107/62 (04/11 1200) SpO2:  [92 %-100 %] 99 % (04/11 1200) FiO2 (%):  [40 %] 40 % (04/11 1200)  Physical Examination: General: Chronically ill-appearing woman, ventilated HEENT: Pupils reactive, oropharynx moist and clear, cuffed Shiley trach in place without lesions Neuro: No response to voice, she does grimace with pain, will not follow commands, does move her upper extremities CV: Irregular, no murmur PULM: Coarse bilaterally, no wheezing, no crackles GI: PEG tube in place, no purulence.  No apparent tenderness, positive bowel sounds Extremities: Some contractures of her lower extremities, 2+ pitting edema Skin: No rash.  Sacral ulcer not examined  today   Active Problems:   Pressure ulcer, stage 4 (HCC)   Bilateral pneumonia   Goals of care, counseling/discussion   Palliative care by specialist   Sepsis with acute organ dysfunction without septic shock [facility])   ASSESSMENT AND PLAN  PULMONARY Recent Labs  Lab 08/12/18 2059 08/14/18 0337  PHART 7.443 7.492*  PCO2ART 54.6* 35.8  PO2ART 100.0 108.0  HCO3 36.6* 27.3  O2SAT 97.0 99.0   Ventilator Settings: Vent Mode: PRVC FiO2 (%):  [40 %] 40 % Set Rate:  [20 bmp] 20 bmp Vt Set:  [490 mL] 490 mL PEEP:  [5 cmH20] 5 cmH20 Plateau Pressure:  [20 cmH20-24 cmH20] 24 cmH20   Acute on chronic resp failure with hypoxemia secondary to resistant acinetobacter HCAP Hx of MDR acinetobacter - no CXR 4/9 P:  Continue current ventilator settings, 8 cc/kg.  She does not do spontaneous breathing trials or trach collar at baseline per report Antibiotics changed to Unasyn for drug-resistant Acinetobacter, 4/11 is day 3 of 7 Continue Xopenex/Atrovent bronchodilator regimen  Septic shock- resolved - PICC out for concern of sepsis, BC since neg, only peripheral IVs P:  Norepinephrine weaned to off Plan for new PICC placement prior to transfer back to [facility]  Hypernatremia, 155 on 4/11 P:  Continue free water as ordered Free water 300 ml q 4 hrs Follow urine output, BMP, electrolytes.  End stage dementia - family has unrealistic expectations of recovery. She is bedbound with trach and peg.  P:  Appreciate Palliative care assistance- seen 4/5, daughter will not change code status despite quality of life, prolonging life is most important to family Tube feeding as ordered Continue Valium,  Vicodin, MSIR as ordered (chronic meds)  Seizure Disorder P:  Continue home Keppra, valproate  Afib on eliquis  P:  Unclear to me that there is any significant clinical benefit to continuing Eliquis in this setting.  She is profoundly debilitated neurologically  Systolic HF 61-51%  8/34/37 Hypervolemia - net +7L P:  Norepinephrine weaned to off Holding off on carvedilol until hemodynamics stabilized    BEST PRACTICE / DISPOSITION Code Status:  FULL Diet:  TF DVT Px:  eliquis GI Px:  PPI Dispo: Question back to [facility] on 4/13   Baltazar Apo, MD, PhD 08/19/2018, 2:25 PM [facility] [phone] or if no answer [phone]

## 2018-08-19 NOTE — Progress Notes (Signed)
Family Medicine Social Note  Patient back on levophed and under care of CCM team. Appreciate their help, FPTS will be available to pick up patient when off pressors and stable for stepdown status.  Marjie Skiff, MD [place], PGY-3

## 2018-08-20 LAB — BASIC METABOLIC PANEL
Anion gap: 7 (ref 5–15)
BUN: 47 mg/dL — ABNORMAL HIGH (ref 8–23)
CO2: 28 mmol/L (ref 22–32)
Calcium: 9 mg/dL (ref 8.9–10.3)
Chloride: 117 mmol/L — ABNORMAL HIGH (ref 98–111)
Creatinine, Ser: 0.42 mg/dL — ABNORMAL LOW (ref 0.44–1.00)
GFR calc Af Amer: >60 mL/min (ref >60)
GFR calc non Af Amer: >60 mL/min (ref >60)
Glucose, Bld: 104 mg/dL — ABNORMAL HIGH (ref 70–99)
Potassium: 3.8 mmol/L (ref 3.5–5.1)
Sodium: 152 mmol/L — ABNORMAL HIGH (ref 135–145)

## 2018-08-20 LAB — GLUCOSE, CAPILLARY
Glucose-Capillary: 94 mg/dL (ref 70–99)
Glucose-Capillary: 87 mg/dL (ref 70–99)
Glucose-Capillary: 120 mg/dL — ABNORMAL HIGH (ref 70–99)
Glucose-Capillary: 113 mg/dL — ABNORMAL HIGH (ref 70–99)
Glucose-Capillary: 103 mg/dL — ABNORMAL HIGH (ref 70–99)

## 2018-08-20 MED ORDER — FUROSEMIDE 10 MG/ML IJ SOLN
20.0000 mg | Freq: Two times a day (BID) | INTRAMUSCULAR | Status: DC
  Administered 2018-08-20: 17:00:00 20 mg via INTRAVENOUS
  Filled 2018-08-20: qty 2

## 2018-08-20 NOTE — Progress Notes (Signed)
Family Medicine Teaching Service Daily Progress Note Intern Pager: [phone]  Patient name: Melissa Cochran Medical record number: [id number] Date of birth: May 06, 1936 Age: 83 y.o. Gender: female  Primary Care Provider: [name], MD Consultants: CCM Code Status: Full  Pt Overview and Major Events to Date:  4/4 - admit for pneumonia 4/5-4/7 - ICU 4/8 - out to [facility] team 4/8-4/11 - ICU 4/12 - out to [facility] team  Assessment and Plan: 83 year old chronically trach-vent dependent patient admitted for bilateral PNA and possible UTI.   Hypoxic respiratory failure, chronically trached, acute on chronic respiratory rates 20-23 overnight.  Mild tachypnea to 102 overnight with atrial fibrillation.  Blood pressure with maps from 57-82 overnight.  Likely worsened secondary to pneumonia from Acinetobacter baumannii with multiple drug resistance. -CCM to continue vent management -Continue Unasyn (4/9-4/15) -Tylenol PRN -Ipratropium neb every 6 hours -Xopenex neb every 6 hours -Midodrine 5 mg 3 times daily  CHF with reduced ejection fraction (45-50%), acute on chronic 1.1 L urine output on 4/11.  Volume up 4.9 L from admission.  Physical exam today remarkable for severe fluid overload noted on exam with anasarca, JVD to the angle of the jaw, auscultation of the lungs was difficult due to habitus and inability patient to move herself.  Anterior auscultation revealed normal breath sounds.  We will commence diuresis after stabilization of blood pressures. -start Lasix 20 mg IV twice daily   Hypotension Secondary to sepsis now off of intravenous pressors.  Continuing Midrin 5 mg 3 times daily.  Maps in the past 24 hours primarily been consistent in the 70s and high 60s.  Lowest map in the past 24 hours was 87.  Overall, this appears to be improving with resolution of sepsis.  Will discontinue Midrin once blood pressures are more stable. -Continue Midodrine 5 mg 3 times daily  Hypernatremia,  hypervolemic Sodium 152 (4/12)<155 (4/11) -Free water flush 300 mL's every 4 hours -Start Lasix 20 mg IV twice daily  A. Fib -Continue Eliquis -Metoprolol IV 2.5-5 mg every 3 hours as needed for heart rate above 120  Seizure disorder -Continue Keppra -Continue valproate  Dementia -valium 2 mg Q6 PRN  -delirium precautions.  Chronic pain -Norco 5 Q8 scheduled  FEN/GI: Osmolite 1.5 40 mL/h, continuous, Protostat 60 mils twice daily. PPx: 60 min 5 mg daily  Disposition: Possible discharge back to SNF on 4/13  Subjective:  Patient was not responsive to verbal or tactile stimuli this morning.  Objective: Temp:  [97.5 F (36.4 C)-100.6 F (38.1 C)] 99 F (37.2 C) (04/12 0600) Pulse Rate:  [89-113] 102 (04/12 0600) Resp:  [19-31] 23 (04/12 0600) BP: (78-110)/(41-70) 110/69 (04/12 0600) SpO2:  [90 %-100 %] 95 % (04/12 0600) FiO2 (%):  [40 %] 40 % (04/12 0315)  General: Alert and cooperative and appears to be in no acute distress. Significant global edema.  Not responsive to verbal or tactile stimuli. HEENT: JVD to angle of jaw Cardio: Normal S1 and S2, no S3 or S4. Rhythm is regular. No murmurs or rubs.   Pulm: Clear to auscultation bilaterally, no crackles, wheezing, or diminished breath sounds anteriorly and laterally although lung exam is difficult on this patient.  Breathing via ventilated trach. Abdomen: Distended, edematous abdomen.  Bowel sounds normal. Extremities: Anasarca.  Pitting edema of the shins, thighs, stomach, hands, forearms.peripheral pulses intact.    Laboratory: Recent Labs  Lab 08/15/18 0330 08/16/18 0312 08/17/18 0439  WBC 10.9* 13.4* 19.5*  HGB 8.1* 7.4* 7.8*  HCT 30.7* 28.4* 29.6*  PLT 188 201 305   Recent Labs  Lab 08/17/18 0439 08/19/18 0547 08/20/18 0525  NA 151* 155* 152*  K 4.0 3.7 3.8  CL 115* 116* 117*  CO2 23 28 28   BUN 59* 51* 47*  CREATININE 0.52 0.48 0.42*  CALCIUM 9.2 9.1 9.0  GLUCOSE 116* 111* 104*     Imaging/Diagnostic Tests: No results found.   Matilde Haymaker, MD 08/20/2018, 6:33 AM PGY-1, [place] Intern pager: [phone], text pages welcome

## 2018-08-20 NOTE — Progress Notes (Signed)
No issues at night. Patient remains unresponsive with head contracted to right .  +4 pitting edema throughout extremities.  No medications running at this time.  Urine output decreasing steadily.  Continue to monitor

## 2018-08-21 DIAGNOSIS — J9611 Chronic respiratory failure with hypoxia: Secondary | ICD-10-CM

## 2018-08-21 LAB — BASIC METABOLIC PANEL
Anion gap: 11 (ref 5–15)
BUN: 49 mg/dL — ABNORMAL HIGH (ref 8–23)
CO2: 29 mmol/L (ref 22–32)
Calcium: 9.3 mg/dL (ref 8.9–10.3)
Chloride: 112 mmol/L — ABNORMAL HIGH (ref 98–111)
Creatinine, Ser: 0.41 mg/dL — ABNORMAL LOW (ref 0.44–1.00)
GFR calc Af Amer: >60 mL/min (ref >60)
GFR calc non Af Amer: >60 mL/min (ref >60)
Glucose, Bld: 84 mg/dL (ref 70–99)
Potassium: 3.6 mmol/L (ref 3.5–5.1)
Sodium: 152 mmol/L — ABNORMAL HIGH (ref 135–145)

## 2018-08-21 LAB — CBC
HCT: 24.8 % — ABNORMAL LOW (ref 36.0–46.0)
Hemoglobin: 6.6 g/dL — CL (ref 12.0–15.0)
MCH: 24.2 pg — ABNORMAL LOW (ref 26.0–34.0)
MCHC: 26.6 g/dL — ABNORMAL LOW (ref 30.0–36.0)
MCV: 90.8 fL (ref 80.0–100.0)
Platelets: 304 10*3/uL (ref 150–400)
RBC: 2.73 MIL/uL — ABNORMAL LOW (ref 3.87–5.11)
RDW: 17.9 % — ABNORMAL HIGH (ref 11.5–15.5)
WBC: 7.8 10*3/uL (ref 4.0–10.5)
nRBC: 1.3 % — ABNORMAL HIGH (ref 0.0–0.2)

## 2018-08-21 LAB — GLUCOSE, CAPILLARY
Glucose-Capillary: 101 mg/dL — ABNORMAL HIGH (ref 70–99)
Glucose-Capillary: 103 mg/dL — ABNORMAL HIGH (ref 70–99)
Glucose-Capillary: 103 mg/dL — ABNORMAL HIGH (ref 70–99)
Glucose-Capillary: 109 mg/dL — ABNORMAL HIGH (ref 70–99)
Glucose-Capillary: 112 mg/dL — ABNORMAL HIGH (ref 70–99)
Glucose-Capillary: 115 mg/dL — ABNORMAL HIGH (ref 70–99)
Glucose-Capillary: 92 mg/dL (ref 70–99)
Glucose-Capillary: 93 mg/dL (ref 70–99)

## 2018-08-21 LAB — HEMOGLOBIN AND HEMATOCRIT, BLOOD
HCT: 27.4 % — ABNORMAL LOW (ref 36.0–46.0)
Hemoglobin: 7.9 g/dL — ABNORMAL LOW (ref 12.0–15.0)

## 2018-08-21 LAB — IRON AND TIBC
Iron: 27 ug/dL — ABNORMAL LOW (ref 28–170)
Saturation Ratios: 9 % — ABNORMAL LOW (ref 10.4–31.8)
TIBC: 294 ug/dL (ref 250–450)
UIBC: 267 ug/dL

## 2018-08-21 LAB — FERRITIN: Ferritin: 104 ng/mL (ref 11–307)

## 2018-08-21 LAB — PREPARE RBC (CROSSMATCH)

## 2018-08-21 LAB — ABO/RH: ABO/RH(D): A NEG

## 2018-08-21 MED ORDER — SODIUM CHLORIDE 0.9% IV SOLUTION
Freq: Once | INTRAVENOUS | Status: AC
  Administered 2018-08-21: 13:00:00 via INTRAVENOUS

## 2018-08-21 MED ORDER — FUROSEMIDE 10 MG/ML IJ SOLN
40.0000 mg | Freq: Two times a day (BID) | INTRAMUSCULAR | Status: DC
  Administered 2018-08-21 – 2018-08-25 (×10): 40 mg via INTRAVENOUS
  Filled 2018-08-21 (×10): qty 4

## 2018-08-21 MED ORDER — FREE WATER
200.0000 mL | Status: DC
  Administered 2018-08-21 – 2018-08-22 (×5): 200 mL

## 2018-08-21 MED ORDER — MIDODRINE HCL 5 MG PO TABS
5.0000 mg | ORAL_TABLET | Freq: Two times a day (BID) | ORAL | Status: DC
  Administered 2018-08-21: 17:00:00 5 mg via ORAL
  Filled 2018-08-21 (×2): qty 1

## 2018-08-21 MED ORDER — FREE WATER: 350.0000 mL | Status: DC

## 2018-08-21 NOTE — Progress Notes (Addendum)
Family Medicine Teaching Service Daily Progress Note Intern Pager: [phone]  Patient name: Melissa Cochran Medical record number: [id number] Date of birth: 1935/08/01 Age: 83 y.o. Gender: female  Primary Care Provider: [name], MD Consultants: CCM Code Status: Full  Pt Overview and Major Events to Date:  4/4 - admit for pneumonia 4/5-4/7 - ICU 4/8 - out to [facility] team 4/8 urgent bronch 2/2 mucous plus-> back to ICU 4/9 trach cx sens-> switched to unasyn 4/12 - out to [facility] team 4/13 1u prbc transfused  Assessment and Plan: 83 year old chronically trach-vent dependent patient admitted for bilateral PNA and possible UTI.   Hypoxic respiratory failure 2/2 bilateral pneumonia, acute on chronic Trach vent in place. Managed by CCM, appreciate their help. Patient with acetinobacter growing from tracheal aspirate, only sensitive to Unasyn. Has been improving since switching to Unasyn. BP 128/80 while in room this am. Wbc 7.8, afebrile for >48 hours. Can likely d/c back to [facility] soon. - Continue Unasyn (4/9 - ) - will discuss length of treatment with ID - Monitor WBC, fever curve, blood pressure - continue ipratropium neb every 6 hours prn - continue xopenex neb q 6 hours prn - midodrine 5mg  3 times daily, can likely stop if has more Bps in 120s  Anemia Hgb 6.6. Baseline appears to be between 7.7-8.3. Given heart failure and afib will transfuse one unit of blood. Added on iron panel to am labs. Likely 2/2 anemia of chronic disease. Could also be dilutional 2/2 free water in tubs and fluid 2/2 medication administration. - transfuse 1 unit - f/u iron panel - decrease free water to 273mL q 4 hours - post-transfusion H&H  CHF with reduced ejection fraction (45-50%), acute on chronic 1.6 L urine output last 24 hours. 4+ pitting edema on exam. Up 7.5L since admission. Will increase lasix to 40mg  BID IV. Will decrease free water to 224mL q 4 hours. Giving 1 Uprbc 2/2 anemia (discussed  below). - strict I/O - increase lasix to 40mg  BID IV - decrease free water to 256mL q 4 hours - transfuse 1 uprbc  Hypotension Improved. 128/80 during exam this am. Off pressors. Getting midodrine 5mg  tid. Can likely stop midodrine if has more normotensive pressures. Will monitor with increase diuresis. - lasix 40mg  BID IV - decrease midodrine 5mg  bid  Hypernatremia, hypervolemic Sodium stable at 151 last 2 days. Will decrease free water to 222mL q 4hours 2/2 volume overload. Improved since peak at 158 on admission. Will increase lasix to 40mg  IV bid. - lasix 40mg  bid IV - decrease free water to 239mL q 4hours - daily bmp  A. Fib -Continue Eliquis 5mg  bid via tube -Metoprolol IV 2.5-5 mg every 3 hours as needed for heart rate above 120  Seizure disorder -Continue Keppra -Continue valproate  Dementia -valium 2 mg Q6 PRN  -delirium precautions.   Chronic pain -Norco 5 Q8 scheduled  Goals of care Despite long-term prognosis, family is not willing to change code status. Remains full code at this time. Likely stable for discharge back to [facility], pending post-transfusion H&H.  FEN/GI: Osmolite 1.5 40 mL/h, continuous, Protostat 60 mils twice daily. PPx: eliquis 5mg  bid  Disposition: Possible discharge back to SNF on 4/13, pending hemoglobin levels  Subjective:  Not verbally responsive. At baseline.  Objective: Temp:  [98.8 F (37.1 C)-100 F (37.8 C)] 98.8 F (37.1 C) (04/13 0500) Pulse Rate:  [96-121] 96 (04/13 0500) Resp:  [19-26] 20 (04/13 0500) BP: (86-124)/(52-69) 103/59 (04/13 0500)  SpO2:  [89 %-100 %] 97 % (04/13 0725) FiO2 (%):  [40 %] 40 % (04/13 0725) Weight:  [81.6 kg] 81.6 kg (04/13 0417)  General: sleeping comfortably, arousable to stimuli. Non-verbal. No acute distress HEENT: trach-vent in place Cardio: Normal S1 and S2, no S3 or S4. Rhythm is regular. No murmurs or rubs. 4+ pitting edema BLE, arms, stomach  Pulm: CTAB, no respiratory distress.  Breathing via ventilated trach. Abdomen: Distended, edematous abdomen.  Bowel sounds normal. Extremities: warm and dry. 4+ pitting edema as above.  Laboratory: Recent Labs  Lab 08/16/18 0312 08/17/18 0439 08/21/18 0411  WBC 13.4* 19.5* 7.8  HGB 7.4* 7.8* 6.6*  HCT 28.4* 29.6* 24.8*  PLT 201 305 304   Recent Labs  Lab 08/19/18 0547 08/20/18 0525 08/21/18 0411  NA 155* 152* 152*  K 3.7 3.8 3.6  CL 116* 117* 112*  CO2 28 28 29   BUN 51* 47* 49*  CREATININE 0.48 0.42* 0.41*  CALCIUM 9.1 9.0 9.3  GLUCOSE 111* 104* 84    Imaging/Diagnostic Tests: No results found.   Guadalupe Dawn, MD 08/21/2018, 8:15 AM PGY-2, [place] Intern pager: [phone], text pages welcome

## 2018-08-21 NOTE — Discharge Summary (Addendum)
[facility] Discharge Summary  Patient name: Melissa Cochran Medical record number: [id number] Date of birth: 02-19-1936 Age: 83 y.o. Gender: female Date of Admission: 08/12/2018  Date of Discharge: 08/25/2018 Admitting Physician: Lind Covert, MD  Primary Care Provider: [name], MD Consultants: critical care medicine  Indication for Hospitalization:  Bilateral pneumonia  Discharge Diagnoses/Problem List:  Hypoxic respiratory failure Anemia HFrEF Hypotension Hypernatremia Atrial Fibrillation Seizure disorder Dementia Chronic pain  Goals of care  Disposition: [facility] to [facility]  Discharge Condition: stable  Discharge Exam:  General: NAD, non-toxic, pale, elderly woman on trach vent, not alert  Cardiovascular: RRR, normal S1, S2.  Respiratory: CTAB. No IWOB. Trach vent.  Abdomen: + BS. NT, ND, soft.  Extremities: Warm and well perfused. No spontaneous movement of extremities.  Neuro: pupils so to constrict, eyelids close slowly after manual opening. She does not respond to name or to fingernail squeeze.   Brief Hospital Course:  83 year old female who presented on 4/4 from [facility] for fevers, hypertension, and respiratory distress refractory to vanc and zosyn. She was sent over for a presumed UTI. Patient was found to have bilateral opacities on initial chest xray. Patient had presented similarly in 06/2018 and was found to have acetinobacter pneumonia which was sensitive to meropenem. She was started on vanc and meropenem on arrival.  Bilateral Pneumonia/Hypotension/Chronic trach vent/Severe sepsis Patient continued to fever and be hypotensive up until 4/8 when she was normotensive and able to be weaned off of pressors. She continue to fever at every check and that am she had a large mucous plug causing an acute desat. She underwent emergent bronchoscopy which did improve her respiratory status. She had to be restarted on pressors at  that time. On 4/9 her tracheal aspirate grew out acetinobactor baumanii which was only sensitive to unasyn. Vanc and meropenem were stopped at that time, and she was switched to unasyn. After switching to unasyn her fevers, leukocytosis, and hypotension all gradually resolved. She was weaned off of pressors on 4/11 and transferred to floor status at that time. She completed unasyn on 4/15 and had remained afebrile with normal WBC > 24hrs.    Anemia Patient was found to be anemic with hgb of 6.6 on 4/13. Her baseline during admission was 7.5-8. She received 1upRBC at that time. This was again required on 4/15 due to Hgb of Her anemia was likely 2/2 anemia of chronic disease, fluid overload 2/2 her sepsis, and free water she was receiving 2/2 hypernatremia.  FOBT was positive, but patient had rectal tube in place, therefore could be from micro trauma.  Eliquis was held given anemia and discussed with patient's daughter.  She agreed to have patient discharged without Eliquis and would continue risk/benefit discussion with her PCP.  At the time of discharge, her Hgb was stable at 9.8. GI was curbsided for these findings and they recommended continuing PPI (PO or IV) as well as holding eliquis.   Hypervolemic Hypernatremia Patient found to be hypernatremic to 158 on arrival. She was started on 337mL free water q 4 hours. Her sodium fell to 151 on 4/13. At that time her free water was decreased to 200 2/2 volume overload. Some adjustments were made on her free water, but increased back to 379mL at discharge. Please continue to trend sodium via BMP with daily CBC (as below).   HFrEF Patient with known EF of 45-50%. Due to aggressive hydration while patient was septic she developed fluid overload.  She was started on lasix on 4/12 with good diuresis. She was increased to 40mg  BID IV of lasix on 4/13 after starting on 20mg  BID the previous day. She was discharged on lasix 40mg  BID IV after ~7L diuresis. Recommend  continuing that dose, which can be adjusted long term at her LTACH.  Hypotension Patient was persistently hypotensive 2/2 sepsis. She was started on midodrine and was on levophed until 4/11. This was weaned off and patient was not hypotensive 48 hours prior to transfer back to [facility]. Midodrine was gradually weaned from 5mg  by half dose each day. She was discharged on 2.5mg  bid with the ultimate goal to completely wean if able to maintain pressure. Patient also with a fib and taken off of eliquis is setting of acute drop in hemoglobin. HR in low 100's to 120's. Restarted patient's Coreg at a lower dose, 3.125mg  BID. Can pull back on Coreg if thought to be affecting blood pressures.   Goals of care Palliative care was consulted to help with goals of care discussion given the patient's severe illness and already poor baseline of health. To recap her baseline is unresponsive, trach/peg, at an [facility]. It was established that the families wishes were to prolong life by any means necessary. She remains full code on discharge.  Issues for Follow Up:  1. Wean off midodrine as pressures tolerated 2. Holding patient's eliquis in setting of anemia.  Would not suggesting restarting given patient's overall prognosis as risks outweigh the benefits. 3. Please trend Hgb for 2-3 days to ensure stability. Then once weekly or per [facility] MD.  4. Can possibly decrease lasix pending volume status, can be evaluated by physician at [facility] 5. Can titrate Coreg for rate control for a fib vs. Hypotensive episodes.  6. Daily potassium supplements added with IV lasix. Please discontinue if lasix discontinued.  7. IV protonix to continue until 4/21. Patient placed on this for empiric prophylaxis for hemoglobin concerning for slow GIB. Can transition to 40mg  daily PO after 08/29/18.  8. Trend sodium in setting of hypernatremia. Adjust free water and lasix as needed.   Significant Procedures: bronchoscopy 4/8, s/p 1 u pRBC  x2  Significant Labs and Imaging:  Recent Labs  Lab 08/23/18 0220 08/23/18 1120 08/24/18 0500 08/25/18 0314  WBC 7.0  --  7.0 6.8  HGB 6.5* 9.1* 8.7* 8.9*  HCT 24.1* 32.2* 31.7* 31.6*  PLT 273  --  304 303   Recent Labs  Lab 08/19/18 0547  08/22/18 0242 08/23/18 0220 08/23/18 1120 08/24/18 0500 08/25/18 0314  NA 155*   < > 150* 153* 155* 154* 156*  K 3.7   < > 3.5 3.0* 3.9 4.2 4.0  CL 116*   < > 112* 117* 112* 111 112*  CO2 28   < > 31 29 32 33* 40*  GLUCOSE 111*   < > 117* 106* 112* 105* 115*  BUN 51*   < > 57* 48* 50* 56* 59*  CREATININE 0.48   < > 0.50 0.46 0.50 0.56 0.58  CALCIUM 9.1   < > 9.2 8.2* 9.7 9.5 10.0  MG  --   --   --   --   --  2.5*  --   PHOS 2.5  --   --   --   --   --   --    < > = values in this interval not displayed.    Results/Tests Pending at Time of Discharge:   Discharge Medications:  Allergies as of 08/25/2018   No Known Allergies     Medication List    STOP taking these medications   apixaban 5 MG Tabs tablet Commonly known as:  ELIQUIS   diazepam 5 MG tablet Commonly known as:  VALIUM   furosemide 40 MG tablet Commonly known as:  LASIX Replaced by:  furosemide 10 MG/ML injection   vancomycin 1,000 mg in sodium chloride 0.9 % 250 mL   Zosyn 3.375 (3-0.375) g injection Generic drug:  piperacillin-tazobactam     TAKE these medications   acetaminophen 160 MG/5ML solution Commonly known as:  TYLENOL Place 20.3 mLs (650 mg total) into feeding tube every 6 (six) hours as needed for mild pain, headache or fever.   aspirin 81 MG chewable tablet Place 1 tablet (81 mg total) into feeding tube daily.   carvedilol 3.125 MG tablet Commonly known as:  COREG Place 1 tablet (3.125 mg total) into feeding tube 2 (two) times daily with a meal. What changed:    medication strength  how much to take   chlorhexidine 0.12 % solution Commonly known as:  PERIDEX 5 mLs See admin instructions. 5 ml's for trach care 2 times a day    collagenase ointment Commonly known as:  SANTYL Apply topically daily.   famotidine 20 MG tablet Commonly known as:  PEPCID Place 20 mg into feeding tube daily.   feeding supplement (PRO-STAT SUGAR FREE 64) Liqd Place 30 mLs into feeding tube 2 (two) times daily.   free water Soln Place 300 mLs into feeding tube every 4 (four) hours. What changed:  how much to take   furosemide 10 MG/ML injection Commonly known as:  LASIX Inject 4 mLs (40 mg total) into the vein 2 (two) times daily. Replaces:  furosemide 40 MG tablet   HYDROcodone-acetaminophen 5-325 MG tablet Commonly known as:  NORCO/VICODIN Place 1 tablet into feeding tube every 8 (eight) hours.   ipratropium-albuterol 0.5-2.5 (3) MG/3ML Soln Commonly known as:  DUONEB Take 3 mLs by nebulization as needed (for acute and chronic respiratory failure, unspecified whether with hypoxia or hypercapnia).   levETIRAcetam 100 MG/ML solution Commonly known as:  KEPPRA Place 10 mLs (1,000 mg total) into feeding tube 2 (two) times daily.   midodrine 2.5 MG tablet Commonly known as:  PROAMATINE Take 1 tablet (2.5 mg total) by mouth 2 (two) times daily with a meal.   morphine 15 MG tablet Commonly known as:  MSIR Place 0.5 tablets (7.5 mg total) into feeding tube every 6 (six) hours as needed (for dyspnea).   multivitamin Liqd Place 15 mLs into feeding tube daily.   multivitamin tablet Place 1 tablet into feeding tube daily.   NORMAL SALINE FLUSH IV Inject 10 mLs into the vein See admin instructions. Use 10 ml's via IV every shift for any used lumens- flush each unused lumen   Isosource 1.5 Cal Liqd Place 70 mLs into feeding tube 2 (two) times daily.   nutrition supplement (JUVEN) Pack Place 1 packet into feeding tube 2 (two) times daily between meals.   feeding supplement (VITAL AF 1.2 CAL) Liqd Place 1,000 mLs into feeding tube continuous.   pantoprazole 40 MG injection Commonly known as:  PROTONIX Inject 40 mg  into the vein every 12 (twelve) hours for 4 days.   potassium chloride 20 MEQ/15ML (10%) Soln Place 15 mLs (20 mEq total) into feeding tube 2 (two) times daily.   Valproate Sodium 250 MG/5ML Soln solution Commonly known as:  Zena  5 mLs (250 mg total) into feeding tube 2 (two) times daily.       Discharge Instructions: Please refer to Patient Instructions section of EMR for full details.  Patient was counseled important signs and symptoms that should prompt return to medical care, changes in medications, dietary instructions, activity restrictions, and follow up appointments.   Follow-Up Appointments:   Wilber Oliphant, MD 08/25/2018, 4:30 PM PGY-2, [place]

## 2018-08-21 NOTE — Progress Notes (Signed)
Name: Melissa Cochran MRN: [id number] DOB: 21-Jan-1936 CC: SOB   LOS: 9  Referring Provider:  DR.Mecariello Reason for Referral:  HYPOTENSION  CRITICAL CARE MEDICINE  Brief History   83 year old woman transferred from [place] with fever 102.5, hx of chronic respiratory failure, tracheostomy and chronic ventilator, end stage dementia, PEG dependent, atrial fibrillation on apixaban, HTN, CAD, s/p Stent 8338, Chronic systolic CHF EF 25-05% 3/97/67, nephrolithiasis complicated by hydronephrosis s/p stent placement in 2017, CVA, seizure disorder.   Admitted 4/4 with fever and hypoxemia, septic shock requiring pressors.  Intermittently on pressors 4/7 with ongoing fevers.  Copious secretions, episode of mucus plugging requiring BVM, s/p bronch.     Blood cx 4/4 >> ngtd Urine cx 4/4 >> negative Trach asp 4/7 >> acinetobacter- resistant, only sensitive to unasyn  RVP neg 4/4 COVID >> neg  4/4 Meropenem >> 4/9 4/6 vanc >> 4/7 4/9 unasyn >> stop date 4/15 in place   SUBJECTIVE: Hgb 6.6 this AM.  No signs of bleeding.  Transfusing 1u PRBC. On PRVC: 490 / 20 / 40% / 5  Vital Signs: Temp:  [98.8 F (37.1 C)-100 F (37.8 C)] 98.8 F (37.1 C) (04/13 0500) Pulse Rate:  [96-121] 96 (04/13 0500) Resp:  [19-26] 20 (04/13 0500) BP: (86-124)/(52-69) 103/59 (04/13 0500) SpO2:  [89 %-100 %] 97 % (04/13 0725) FiO2 (%):  [40 %] 40 % (04/13 0725) Weight:  [81.6 kg] 81.6 kg (04/13 0417)  Physical Examination:  General: Chronically ill appearing female,  in NAD. Neuro: Not responsive.  Does grimace to pain. HEENT: [place]/AT. Sclerae anicteric.  Trach C/D/I. Cardiovascular: IRIR, no M/R/G.  Lungs: Respirations even and unlabored.  CTA bilaterally, No W/R/R. Abdomen: BS x 4, soft, NT/ND.  Musculoskeletal: No gross deformities, 2+ edema.  Skin: Sacral ulcer per report.  Otherwise intact, warm, no rashes.   ASSESSMENT AND PLAN  Acute on chronic resp failure with hypoxemia secondary to  resistant acinetobacter HCAP Hx of MDR acinetobacter P:  Continue current ventilator settings She does not do spontaneous breathing trials or trach collar at baseline per report Continue unasyn through 4/15 Continue Xopenex/Atrovent bronchodilator regimen CXR intermittently  Septic shock - resolved, remains off pressors P:  Continue midodrine  End stage dementia - family has unrealistic expectations of recovery. She is bedbound with trach and peg.  P:  Appreciate Palliative care assistance - seen 4/5, daughter will not change code status despite quality of life, prolonging life is most important to family Tube feeding as ordered Continue Valium, Vicodin, MSIR as ordered (chronic meds)  Seizure Disorder P:  Continue home Keppra, valproate  Afib on eliquis  P:  Unclear that there is any significant clinical benefit to continuing Eliquis in this setting.  She is profoundly debilitated neurologically  Anemia - worsened 4/13.  No signs of bleeding. P: 1u PRBC ordered by primary team Follow H/H    Rest per primary team.  Per RN, possibly transfer back to [facility] today.  OK from PCCM standpoint.   Montey Hora, [facility] Pager: [phone].  If no answer, [phone] 08/21/2018, 8:39 AM

## 2018-08-21 NOTE — Progress Notes (Signed)
Pharmacy Antibiotic Note  Melissa Cochran is a 83 y.o. female admitted on 08/12/2018 with HCAP. She was on vancomycin/zosyn at [facility] ([place] zosyn). CXR showing bilateral infiltrates- trach cx growing GNR/GPR at [facility]. Recent trach aspirate on 06/13/2018 for Acinetobacter (S imipenem). Trach aspirate from this admission grew Acinetobacter, sensitive only to Unasyn with MIC of 8.  Patient continues on Unsayn. She has been afebrile and WBC down to 7.8. Per ID curbside by CCM, will continue therapy for total of 7 days (through 4/15)   Plan: Continue Unasyn 3g IV q6h through 4/15 (stop date in place) Since definitive therapy has been chosen, pharmacy will sign off and follow peripherally.  Recent Labs  Lab 08/15/18 0330 08/16/18 0312 08/17/18 0439 08/19/18 0547 08/20/18 0525 08/21/18 0411  WBC 10.9* 13.4* 19.5*  --   --  7.8  CREATININE 0.68 0.58 0.52 0.48 0.42* 0.41*     Antimicrobials this admission: Unasyn 4/9 >>(4/15) Meropenem 4/4 >> 4/9 Vancomycin 4/4>> 4/7  Dose adjustments this admission: N/A  Microbiology results: 4/7 TA: Acinetobacter Baumannii; S to Unasyn w/ MIC 8, otherwise pan resistant 4/4 BCx: NGTD 4/4 UCx: NG 4/4 COVID: negative  4/4 viral respiratory: negative   Thank you for allowing pharmacy to be a part of this patient's care.  Jackson Latino, PharmD PGY1 Pharmacy Resident Phone [phone] 08/21/2018     10:22 AM

## 2018-08-22 LAB — CBC
HCT: 26.8 % — ABNORMAL LOW (ref 36.0–46.0)
Hemoglobin: 7.5 g/dL — ABNORMAL LOW (ref 12.0–15.0)
MCH: 24.8 pg — ABNORMAL LOW (ref 26.0–34.0)
MCHC: 28 g/dL — ABNORMAL LOW (ref 30.0–36.0)
MCV: 88.4 fL (ref 80.0–100.0)
Platelets: 312 10*3/uL (ref 150–400)
RBC: 3.03 MIL/uL — ABNORMAL LOW (ref 3.87–5.11)
RDW: 17.6 % — ABNORMAL HIGH (ref 11.5–15.5)
WBC: 9.1 10*3/uL (ref 4.0–10.5)
nRBC: 2.4 % — ABNORMAL HIGH (ref 0.0–0.2)

## 2018-08-22 LAB — GLUCOSE, CAPILLARY
Glucose-Capillary: 92 mg/dL (ref 70–99)
Glucose-Capillary: 104 mg/dL — ABNORMAL HIGH (ref 70–99)
Glucose-Capillary: 129 mg/dL — ABNORMAL HIGH (ref 70–99)
Glucose-Capillary: 104 mg/dL — ABNORMAL HIGH (ref 70–99)
Glucose-Capillary: 119 mg/dL — ABNORMAL HIGH (ref 70–99)
Glucose-Capillary: 109 mg/dL — ABNORMAL HIGH (ref 70–99)

## 2018-08-22 LAB — BASIC METABOLIC PANEL
Anion gap: 7 (ref 5–15)
BUN: 57 mg/dL — ABNORMAL HIGH (ref 8–23)
CO2: 31 mmol/L (ref 22–32)
Calcium: 9.2 mg/dL (ref 8.9–10.3)
Chloride: 112 mmol/L — ABNORMAL HIGH (ref 98–111)
Creatinine, Ser: 0.5 mg/dL (ref 0.44–1.00)
GFR calc Af Amer: >60 mL/min (ref >60)
GFR calc non Af Amer: >60 mL/min (ref >60)
Glucose, Bld: 117 mg/dL — ABNORMAL HIGH (ref 70–99)
Potassium: 3.5 mmol/L (ref 3.5–5.1)
Sodium: 150 mmol/L — ABNORMAL HIGH (ref 135–145)

## 2018-08-22 MED ORDER — MIDODRINE HCL 5 MG PO TABS
2.5000 mg | ORAL_TABLET | Freq: Two times a day (BID) | ORAL | Status: DC
  Administered 2018-08-22 – 2018-08-25 (×8): 2.5 mg via ORAL
  Filled 2018-08-22 (×9): qty 1

## 2018-08-22 MED ORDER — FREE WATER
150.0000 mL | Status: DC
  Administered 2018-08-22 – 2018-08-25 (×19): 150 mL

## 2018-08-22 MED ORDER — MIDODRINE HCL 5 MG PO TABS: 2.5000 mg | ORAL_TABLET | Freq: Two times a day (BID) | ORAL | Status: DC

## 2018-08-22 NOTE — Progress Notes (Addendum)
Family Medicine Teaching Service Daily Progress Note Intern Pager: [phone]  Patient name: Melissa Cochran Medical record number: [id number] Date of birth: 1936-02-07 Age: 83 y.o. Gender: female  Primary Care Provider: [name], MD Consultants: CCM Code Status: Full  Pt Overview and Major Events to Date:  4/4 - admit for pneumonia 4/5-4/7 - ICU 4/8 - out to [facility] team 4/8 urgent bronch 2/2 mucous plus-> back to ICU 4/9 trach cx sens-> switched to unasyn 4/12 - out to [facility] team 4/13 1u prbc transfused  Assessment and Plan: 83 year old chronically trach-vent dependent patient admitted for bilateral PNA and possible UTI.   Hypoxic respiratory failure 2/2 bilateral pneumonia, acute on chronic Trach vent in place. Managed by CCM, appreciate their help. Patient with acetinobacter growing from tracheal aspirate, only sensitive to Unasyn. Has been improving since switching to Unasyn. BP 128/80 while in room this am. Wbc 9.1, afebrile for >72 hours. Medically ready for DC to [facility], pending placement - Continue Unasyn (4/9 - 4/15) - can d/c with PICC to finish abx course - Monitor WBC, fever curve, blood pressure - continue ipratropium neb every 6 hours prn - continue xopenex neb q 6 hours prn - decrease midodrine to 2.5mg  2 times daily, will continue to wean of Bps remain stable  Anemia Hgb 7.5 from 6.6 s/p 1uprbc. Baseline appears to be between 7.7-8.3. Iron panel essentially normal. Could also be dilutional 2/2 free water in tubs and fluid 2/2 medication administration. Could possibly also be from chronic blood draws during hospitalization. - decrease free water to 254mL q 4 hours - daily cbc while admitted  CHF with reduced ejection fraction (45-50%), acute on chronic 4.3 L urine output last 24 hours. Improved edema on exam. Cr stable at 0.5. Continue lasix 40mg  BID IV. Decrease free water to 155mL q 4 hours. - strict I/O - continue lasix to 40mg  BID IV - decrease free  water to 121mL q 4 hours  Hypotension Improved. 116/56 per last charting. Weaning midodrine as able. Will monitor with increase diuresis. - lasix 40mg  BID IV - decrease midodrine to 2.5mg  bid  Hypernatremia, hypervolemic Sodium decreased to 150. Will decrease free water to 159mL q 4hours 2/2 volume overload. Improved since peak at 158 on admission. Continue lasix to 40mg  IV bid. - lasix 40mg  bid IV - decrease free water to 18mL q 4hours - daily bmp  A. Fib -Continue Eliquis 5mg  bid via tube -Metoprolol IV 2.5-5 mg every 3 hours as needed for heart rate above 120  Seizure disorder -Continue Keppra -Continue valproate  Dementia -valium 2 mg Q6 PRN  -delirium precautions.   Chronic pain -Norco 5 Q8 scheduled  Goals of care Despite long-term prognosis, family is not willing to change code status. Remains full code at this time. Stable for dc back to [facility]  FEN/GI: Osmolite 1.5 40 mL/h, continuous, Protostat 60 mils twice daily. PPx: eliquis 5mg  bid  Disposition: D/C back to [facility] on 4/14 pending placement  Subjective:  At baseline which is minimally responsive and non-verbal. Grimaced to trach suction.  Objective: Temp:  [98.1 F (36.7 C)-100.2 F (37.9 C)] 99.3 F (37.4 C) (04/14 0500) Pulse Rate:  [97-115] 106 (04/14 0500) Resp:  [20-27] 20 (04/14 0500) BP: (94-126)/(47-73) 116/56 (04/14 0500) SpO2:  [96 %-100 %] 100 % (04/14 0725) FiO2 (%):  [40 %] 40 % (04/14 0800) Weight:  [82.1 kg] 82.1 kg (04/14 0436)  General: grimace to trach suction. Otherwise unresponsive. HEENT: trach vent in  place. Cardio: rrr, no m/r/g. 4+ but improved pitting edema BLE, arms, stomach  Pulm: clear to auscultation bilaterally, no acute distress. Breathing via ventilated trach. Abdomen: Distended, edematous abdomen.  Bowel sounds normal. Extremities: warm and dry. 4+ pitting edema as above.  Laboratory: Recent Labs  Lab 08/17/18 0439 08/21/18 0411 08/21/18 1737  08/22/18 0242  WBC 19.5* 7.8  --  9.1  HGB 7.8* 6.6* 7.9* 7.5*  HCT 29.6* 24.8* 27.4* 26.8*  PLT 305 304  --  312   Recent Labs  Lab 08/20/18 0525 08/21/18 0411 08/22/18 0242  NA 152* 152* 150*  K 3.8 3.6 3.5  CL 117* 112* 112*  CO2 28 29 31   BUN 47* 49* 57*  CREATININE 0.42* 0.41* 0.50  CALCIUM 9.0 9.3 9.2  GLUCOSE 104* 84 117*    Imaging/Diagnostic Tests: No results found.   Guadalupe Dawn, MD 08/22/2018, 8:47 AM PGY-2, [place] Intern pager: [phone], text pages welcome

## 2018-08-22 NOTE — Progress Notes (Signed)
Palliative Medicine RN Note: Chart review done. Note that patient has been medically cleared for d/c to [facility] or vent SNF, once bed is available. As plan is set and primary concern now is disposition/placement, PMT will stay involved only remotely. If new concerns arise, please call our office at [phone].   Marjie Skiff Brindle Leyba, RN, BSN, [facility] Palliative Medicine Team 08/22/2018 1:45 PM Office [phone]

## 2018-08-22 NOTE — Progress Notes (Addendum)
CSW notified patient is ready to dc back to [facility]. CSW contacted [facility] who reports patient was not holding her bed with them, therefore she no longer has a bed available. They report patient will be first on the list when a female bed becomes available again.   Family updated on this. CSW will look for other vent SNF options, however this is often a lengthy process.   Update: [name] states patient will not be discharge to any other vent SNF except [facility] and she will wait until they have a female bed available. CSW explained this could be a long time until a bed becomes available, patient's daughter said that is fine she does not approve of patient going to anywhere else but [facility] at discharge.   [facility] updated and will notify CSW when bed avail.   [place], [place]

## 2018-08-23 ENCOUNTER — Inpatient Hospital Stay (HOSPITAL_COMMUNITY): Payer: Medicare Other

## 2018-08-23 DIAGNOSIS — J9600 Acute respiratory failure, unspecified whether with hypoxia or hypercapnia: Secondary | ICD-10-CM

## 2018-08-23 DIAGNOSIS — D6489 Other specified anemias: Secondary | ICD-10-CM

## 2018-08-23 LAB — CBC
HCT: 24.1 % — ABNORMAL LOW (ref 36.0–46.0)
Hemoglobin: 6.5 g/dL — CL (ref 12.0–15.0)
MCH: 24.5 pg — ABNORMAL LOW (ref 26.0–34.0)
MCHC: 27 g/dL — ABNORMAL LOW (ref 30.0–36.0)
MCV: 90.9 fL (ref 80.0–100.0)
Platelets: 273 10*3/uL (ref 150–400)
RBC: 2.65 MIL/uL — ABNORMAL LOW (ref 3.87–5.11)
RDW: 18.5 % — ABNORMAL HIGH (ref 11.5–15.5)
WBC: 7 10*3/uL (ref 4.0–10.5)
nRBC: 1.3 % — ABNORMAL HIGH (ref 0.0–0.2)

## 2018-08-23 LAB — HEMOGLOBIN AND HEMATOCRIT, BLOOD
HCT: 32.2 % — ABNORMAL LOW (ref 36.0–46.0)
Hemoglobin: 9.1 g/dL — ABNORMAL LOW (ref 12.0–15.0)

## 2018-08-23 LAB — BASIC METABOLIC PANEL
Anion gap: 7 (ref 5–15)
Anion gap: 11 (ref 5–15)
BUN: 48 mg/dL — ABNORMAL HIGH (ref 8–23)
BUN: 50 mg/dL — ABNORMAL HIGH (ref 8–23)
CO2: 29 mmol/L (ref 22–32)
CO2: 32 mmol/L (ref 22–32)
Calcium: 8.2 mg/dL — ABNORMAL LOW (ref 8.9–10.3)
Calcium: 9.7 mg/dL (ref 8.9–10.3)
Chloride: 117 mmol/L — ABNORMAL HIGH (ref 98–111)
Chloride: 112 mmol/L — ABNORMAL HIGH (ref 98–111)
Creatinine, Ser: 0.46 mg/dL (ref 0.44–1.00)
Creatinine, Ser: 0.5 mg/dL (ref 0.44–1.00)
GFR calc Af Amer: >60 mL/min (ref >60)
GFR calc Af Amer: >60 mL/min (ref >60)
GFR calc non Af Amer: >60 mL/min (ref >60)
GFR calc non Af Amer: >60 mL/min (ref >60)
Glucose, Bld: 106 mg/dL — ABNORMAL HIGH (ref 70–99)
Glucose, Bld: 112 mg/dL — ABNORMAL HIGH (ref 70–99)
Potassium: 3 mmol/L — ABNORMAL LOW (ref 3.5–5.1)
Potassium: 3.9 mmol/L (ref 3.5–5.1)
Sodium: 153 mmol/L — ABNORMAL HIGH (ref 135–145)
Sodium: 155 mmol/L — ABNORMAL HIGH (ref 135–145)

## 2018-08-23 LAB — GLUCOSE, CAPILLARY
Glucose-Capillary: 101 mg/dL — ABNORMAL HIGH (ref 70–99)
Glucose-Capillary: 113 mg/dL — ABNORMAL HIGH (ref 70–99)
Glucose-Capillary: 97 mg/dL (ref 70–99)
Glucose-Capillary: 110 mg/dL — ABNORMAL HIGH (ref 70–99)
Glucose-Capillary: 96 mg/dL (ref 70–99)
Glucose-Capillary: 104 mg/dL — ABNORMAL HIGH (ref 70–99)

## 2018-08-23 LAB — MIN INHIBITORY CONC (2 DRUGS)

## 2018-08-23 LAB — PREPARE RBC (CROSSMATCH)

## 2018-08-23 LAB — MIC RESULTS (2 DRUGS)

## 2018-08-23 MED ORDER — PANTOPRAZOLE SODIUM 40 MG IV SOLR
40.0000 mg | Freq: Two times a day (BID) | INTRAVENOUS | Status: DC
  Administered 2018-08-23 – 2018-08-25 (×4): 40 mg via INTRAVENOUS
  Filled 2018-08-23 (×4): qty 40

## 2018-08-23 MED ORDER — POTASSIUM CHLORIDE 20 MEQ/15ML (10%) PO SOLN
20.0000 meq | Freq: Two times a day (BID) | ORAL | Status: DC
  Administered 2018-08-23 – 2018-08-25 (×6): 20 meq
  Filled 2018-08-23 (×6): qty 15

## 2018-08-23 MED ORDER — POTASSIUM CHLORIDE CRYS ER 20 MEQ PO TBCR: 20.0000 meq | EXTENDED_RELEASE_TABLET | Freq: Two times a day (BID) | ORAL | Status: DC

## 2018-08-23 MED ORDER — SODIUM CHLORIDE 0.9 % IV SOLN
80.0000 mg | Freq: Once | INTRAVENOUS | Status: AC
  Administered 2018-08-23: 80 mg via INTRAVENOUS
  Filled 2018-08-23 (×2): qty 80

## 2018-08-23 MED ORDER — SODIUM CHLORIDE 0.9% IV SOLUTION
Freq: Once | INTRAVENOUS | Status: AC
  Administered 2018-08-23: 04:00:00 via INTRAVENOUS

## 2018-08-23 NOTE — Progress Notes (Signed)
Potassium is 3.0. FMTS notified. No new orders at this time.

## 2018-08-23 NOTE — Progress Notes (Addendum)
CRITICAL VALUE ALERT  Critical Value:  Hgb 6.5  Date & Time Notied:  08/23/18 2:51 AM  Provider Notified: Family Medicine  Orders Received/Actions taken: Awaiting further orders

## 2018-08-23 NOTE — Progress Notes (Signed)
Family Medicine Teaching Service Daily Progress Note Intern Pager: [phone]  Patient name: Melissa Cochran Medical record number: [id number] Date of birth: 09/11/1935 Age: 83 y.o. Gender: female  Primary Care Provider: [name], MD Consultants: SW, CCM, CM, Dietitian, Wound Care Code Status: Full Code   Pt Overview and Major Events to Date:  Admitted: 08/12/2018 for CC: Fever  Hospital Day: 12   Assessment and Plan: Melissa Cochran is a 83 y.o. trach dependent female admitted for bilateral pneumonia and UTI with complicated hospital course and 2IICU admissions. Patient has been vent dependent since 2018.   #Anemia Hemoglobin down 6.5 from 7.5 yesterday.  Patient's baseline appears to be around 8-9.  s/p transfusion on 4/13 for hemoglobin of 6.6 with good response to 7.9.  Thought to be dilutional and free water intake decreased.  Iron panel also within normal limits.  Unclear etiology. Slow drop indicates possible slow bleed; however, if bleeding, patient is not a candidate for EGD, colonoscopy. If patient requires 3rd unit, consider reconsulting palliative and consulting ethics.   FOBT   Stop eliquis   Start IV protonix    Follow H/H   #Hypoxic respiratory failure secondary to bilateral pneumonia, acute on chronic Trach vent managed by CCM.  Finish Unasyn, 4/9 to 4/15  Can DC PICC line after final dose today  Ipratropium and Xopenex neb every 6 hours as needed  Decrease midodrine to 2.5 mg twice daily, continue to wean with stable BPs  # HFrEF 45-50%, acute on chronic Anasarca noted on exam on 4/12. Free water has been decreased over last few days. -869mL today. Satting well of trach vent, 40% FiO2.   Continue to limit free water to 163mL q 4 hours    Continue Lasix 40 mg IV BID  Strict IO   # Hypotension  89-145/49-83 in last 24 hours. Tolerating lasix and free water restriction.   Decreased midodrine to 2.5 mg BID    # Hypernatremia, chronic  153  today.    Continue free water restriction and Lasix in setting of anasarca  Daily BMP   Nutrition following  # A fib  HR range from 98-127.   DC eliquis 5mg  BID   PRN metoprolol IV 2.5mg -5mg  q 3 hours for heart rates >120   #Seizure disorder   Continue Keppra  Continue valproate  #Dementia  Continue Valium 2 mg every 6 prn  Delirium precautions  #Chronic pain   Norco 5 mg every 8 scheduled  #Multiple ulcers Varying stages  Wound care following  Nursing precautions    #Goals of care Palliative signed off.  Available for questions if needed.  Family wishes to continue to remain full code despite long-term prognosis.  Waiting for [facility] female bed  #FEN/GI:  . Electrolytes: Monitor sodium and potassium o Potassium 3.0, 20 mEq bid KCl ordered this morning - follow up with 12pm BMP  . Nutrition: Osmolite 1.540 mL/h, continuous, Protostat 60 mL twice daily  Access: PICC line, 4/6VTE prophylaxis: holding in setting of anemia requiring   Disposition: SNF  pending female bed and stable hemoglobin .     Subjective:  NAEO.   Objective: BP (!) 94/59 (BP Location: Right Arm)   Pulse (!) 105   Temp 99.5 F (37.5 C) (Core)   Resp 20   Ht 5\' 4"  (1.626 m)   Wt 82.1 kg   SpO2 100%   BMI 31.07 kg/m  Intake/Output      04/14 0701 - 04/15 0700  P.O. 90   I.V. (mL/kg) 10 (0.1)   Blood 30   Other 110   NG/GT 1140   IV Piggyback 500   Total Intake(mL/kg) 1880 (22.9)   Urine (mL/kg/hr) 2641 (1.3)   Stool 75   Total Output 2716   Net -836        Physical Exam: Gen: NAD, sleeping, not responding to name Skin: Warm and dry. Hematoma on right hand. Diffusely edematous  HEENT: NCAT.  Neck: Soft, supple. No LAD. No JVD. CV: irregular.  No m/r/g.  RP 2+ bilaterally. No BLEE. Resp: CTAB.  No w/r/r.  No increased WOB. On trach vent Abd: hypoactive BS. NTND on palpation.  Laboratory: I have personally read and reviewed all labs and imaging studies.   CBC: Recent Labs  Lab 10-Sep-2018 0439 08/21/18 0411 08/21/18 1737 08/22/18 0242 08/23/18 0220  WBC 19.5* 7.8  --  9.1 7.0  NEUTROABS 15.8*  --   --   --   --   HGB 7.8* 6.6* 7.9* 7.5* 6.5*  HCT 29.6* 24.8* 27.4* 26.8* 24.1*  MCV 90.2 90.8  --  88.4 90.9  PLT 305 304  --  312 774   Basic Metabolic Panel: Recent Labs  Lab 09-10-2018 0439 08/19/18 0547 08/20/18 0525 08/21/18 0411 08/22/18 0242 08/23/18 0220  NA 151* 155* 152* 152* 150* 153*  K 4.0 3.7 3.8 3.6 3.5 3.0*  CL 115* 116* 117* 112* 112* 117*  CO2 23 28 28 29 31 29   GLUCOSE 116* 111* 104* 84 117* 106*  BUN 59* 51* 47* 49* 57* 48*  CREATININE 0.52 0.48 0.42* 0.41* 0.50 0.46  CALCIUM 9.2 9.1 9.0 9.3 9.2 8.2*  MG 2.2  --   --   --   --   --   PHOS 2.1* 2.5  --   --   --   --    GFR: Estimated Creatinine Clearance: 57.2 mL/min (by C-G formula based on SCr of 0.46 mg/dL). Liver Function Tests: Recent Labs  Lab 10-Sep-2018 0439  ALBUMIN 1.7*   CBG: Recent Labs  Lab 08/22/18 1132 08/22/18 1544 08/22/18 2007 08/22/18 2312 08/23/18 0310  GLUCAP 129* 104* 119* 109* 101*   Anemia Panel: Recent Labs    08/21/18 0852  FERRITIN 104  TIBC 294  IRON 27*   Imaging/Diagnostic Tests: No results found.   Wilber Oliphant, MD 08/23/2018, 5:27 AM PGY-1, [place] Intern pager: [phone], text pages welcome

## 2018-08-24 DIAGNOSIS — J9622 Acute and chronic respiratory failure with hypercapnia: Secondary | ICD-10-CM

## 2018-08-24 LAB — BASIC METABOLIC PANEL
Anion gap: 10 (ref 5–15)
BUN: 56 mg/dL — ABNORMAL HIGH (ref 8–23)
CO2: 33 mmol/L — ABNORMAL HIGH (ref 22–32)
Calcium: 9.5 mg/dL (ref 8.9–10.3)
Chloride: 111 mmol/L (ref 98–111)
Creatinine, Ser: 0.56 mg/dL (ref 0.44–1.00)
GFR calc Af Amer: >60 mL/min (ref >60)
GFR calc non Af Amer: >60 mL/min (ref >60)
Glucose, Bld: 105 mg/dL — ABNORMAL HIGH (ref 70–99)
Potassium: 4.2 mmol/L (ref 3.5–5.1)
Sodium: 154 mmol/L — ABNORMAL HIGH (ref 135–145)

## 2018-08-24 LAB — TYPE AND SCREEN
ABO/RH(D): A NEG
Antibody Screen: NEGATIVE
Unit division: 0
Unit division: 0

## 2018-08-24 LAB — CBC WITH DIFFERENTIAL/PLATELET
Abs Immature Granulocytes: 0.04 10*3/uL (ref 0.00–0.07)
Basophils Absolute: 0 10*3/uL (ref 0.0–0.1)
Basophils Relative: 0 %
Eosinophils Absolute: 0.3 10*3/uL (ref 0.0–0.5)
Eosinophils Relative: 5 %
HCT: 31.7 % — ABNORMAL LOW (ref 36.0–46.0)
Hemoglobin: 8.7 g/dL — ABNORMAL LOW (ref 12.0–15.0)
Immature Granulocytes: 1 %
Lymphocytes Relative: 19 %
Lymphs Abs: 1.3 10*3/uL (ref 0.7–4.0)
MCH: 24.5 pg — ABNORMAL LOW (ref 26.0–34.0)
MCHC: 27.4 g/dL — ABNORMAL LOW (ref 30.0–36.0)
MCV: 89.3 fL (ref 80.0–100.0)
Monocytes Absolute: 0.3 10*3/uL (ref 0.1–1.0)
Monocytes Relative: 4 %
Neutro Abs: 5 10*3/uL (ref 1.7–7.7)
Neutrophils Relative %: 71 %
Platelets: 304 10*3/uL (ref 150–400)
RBC: 3.55 MIL/uL — ABNORMAL LOW (ref 3.87–5.11)
RDW: 21.2 % — ABNORMAL HIGH (ref 11.5–15.5)
WBC: 7 10*3/uL (ref 4.0–10.5)
nRBC: 1 % — ABNORMAL HIGH (ref 0.0–0.2)

## 2018-08-24 LAB — BPAM RBC
Blood Product Expiration Date: 202004172359
Blood Product Expiration Date: 202004172359
ISSUE DATE / TIME: 202004131124
ISSUE DATE / TIME: 202004150330
Unit Type and Rh: 600
Unit Type and Rh: 600

## 2018-08-24 LAB — GLUCOSE, CAPILLARY
Glucose-Capillary: 93 mg/dL (ref 70–99)
Glucose-Capillary: 86 mg/dL (ref 70–99)
Glucose-Capillary: 107 mg/dL — ABNORMAL HIGH (ref 70–99)
Glucose-Capillary: 117 mg/dL — ABNORMAL HIGH (ref 70–99)
Glucose-Capillary: 115 mg/dL — ABNORMAL HIGH (ref 70–99)

## 2018-08-24 LAB — MAGNESIUM: Magnesium: 2.5 mg/dL — ABNORMAL HIGH (ref 1.7–2.4)

## 2018-08-24 LAB — OCCULT BLOOD X 1 CARD TO LAB, STOOL: Fecal Occult Bld: POSITIVE — AB

## 2018-08-24 MED ORDER — SODIUM CHLORIDE 0.9 % IV SOLN
INTRAVENOUS | Status: DC | PRN
  Administered 2018-08-24 – 2018-08-25 (×2): 250 mL via INTRAVENOUS

## 2018-08-24 NOTE — Progress Notes (Signed)
[facility] reports they have female bed avail, however requested documents of labs, vitals and temps faxed to them at [phone]. CSW faxed requested documents.   [place], [place]

## 2018-08-24 NOTE — Plan of Care (Signed)
Remains trach to vent - Bedrest with some contractures of neck and Lt hand remain as when admitted -Nutrition per Gtube tolerated well-no anxiety noted pt not interactive

## 2018-08-24 NOTE — Progress Notes (Signed)
eLink Physician-Brief Progress Note Patient Name: Melissa Cochran DOB: 10-10-1935 MRN: [id number]   Date of Service  08/24/2018  HPI/Events of Note  No AM lab orders.   eICU Interventions  Will order: 1. CBC with platelets, BMP and Mg++ level at 5 AM.      Intervention Category Major Interventions: Other:  Sommer,Steven Cornelia Copa 08/24/2018, 4:25 AM

## 2018-08-24 NOTE — Progress Notes (Signed)
Family Medicine Teaching Service Daily Progress Note Intern Pager: [phone]  Patient name: Melissa Cochran Medical record number: [id number] Date of birth: Jan 19, 1936 Age: 83 y.o. Gender: female  Primary Care Provider: [name], MD Consultants: SW, CCM, CM, Dietitian, Wound Care Code Status: Full Code   Pt Overview and Major Events to Date:  Admitted: 08/12/2018 for CC: Fever  Hospital Day: 13   Assessment and Plan: Melissa COGGESHALL is a 83 y.o. trach dependent female admitted for bilateral pneumonia and UTI with complicated hospital course and 2 ICU admissions. Patient has been vent-dependent since 2018.   #Anemia S/p 2 units since 4/13, most recently received 1 unit yesterday.  Patient's hemoglobin 6.5 yesterday, up to 8.7 this a.m.  Suspected to be GI source, patient thoroughly inspected yesterday and no evidence of bleeding from other areas.  6.5 yesterday could also be lab arror given that increase in Hgb was higher than expected for 1 unit transfusion.  Plan to have a discussion with family today to assess how aggressive they would like workup, but patient is likely too ill for EGD and would doubt that GI would work this up.  FOBT - although patient did have rectal tube inserted, therefore would expect this to be positive  Holding Eliquis   Start IV protonix    Continue to monitor hemoglobin  Plan to speak with family today regarding GOC about restarting Eliquis and anemia work up  #Hypoxic respiratory failure secondary to bilateral pneumonia, acute on chronic Trach vent managed by CCM. Did have mildly elevated temp to 100.4, but resolved to 99.3.  Temp occurred before when she would have been due for next Unasyn dose.  Should she fever again, would consider consulted ID regarding restarting Unasyn and need to remove PICC in this setting.  S/p 7 days Unasyn (finished 4/16 0200)  Remove PICC line  Ipratropium and Xopenex neb every 6 hours as needed  Midodrine  decreased to 2.5 mg twice daily yesterday  # HFrEF 45-50%, acute on chronic Anasarca noted on exam on 4/12 and continues to be present. -2089 net I/O in last 24 hours.  Continue to limit free water to 146mL q 4 hours    Continue Lasix 40 mg IV BID  Strict IO   # Hypotension  BP continues to be soft, 81-113/46-63 in last 24 hours.  Decreased midodrine to 2.5 mg BID yesterday, attempting to decrease as tolerated, will continue this dose at this time  # Hypernatremia, chronic  Na 154 today, stable from yesterday at 153.  Continue free water restriction and Lasix in setting of anasarca  Daily BMP   Nutrition following  # A fib  Heart rate 95-134.  Does not appear that patient received PRN metoprolol.  DC eliquis 5mg  BID   PRN metoprolol IV 2.5mg -5mg  q 3 hours for heart rates >120, will confirm with nursing that this is known  #Seizure disorder   Continue Keppra  Continue valproate  #Dementia  Continue Valium 2 mg every 6 prn  Delirium precautions  #Chronic pain   Norco 5 mg every 8 scheduled  #Multiple ulcers Varying stages  Wound care following  Nursing precautions    #Goals of care Palliative signed off.  Available for questions if needed.  Family wishes to continue to remain full code despite long-term prognosis.  Discussed that if patient would require further blood transfusions, would consider an ethics consult.  We are currently holding Eliquis in the setting of anemia, but will call family  today to have a goals of discussion regarding restarting this and anemia work-up.  Should patient require another blood transfusion, would call family first before discussing with ethics.  [facility]ing for [facility] female bed  #FEN/GI:  . Electrolytes: Monitor sodium and potassium . Nutrition: Osmolite 1.540 mL/h, continuous, Protostat 60 mL twice daily  Access: PICC line, 4/6 VTE prophylaxis: holding in setting of anemia requiring blood transfusions  Disposition: SNF   pending female bed and stable hemoglobin.     Subjective:  No acute events overnight.  Objective: BP (!) 83/56   Pulse 100   Temp (!) 100.4 F (38 C) (Bladder)   Resp 20   Ht 5\' 4"  (1.626 m)   Wt 80 kg   SpO2 97%   BMI 30.27 kg/m  Intake/Output      04/15 0701 - 04/16 0700 04/16 0701 - 04/17 0700   P.O. 0    I.V. (mL/kg)     Blood     Other     NG/GT 560    IV Piggyback 400.1    Total Intake(mL/kg) 960.1 (12)    Urine (mL/kg/hr) 2675 (1.4)    Stool 375    Total Output 3050    Net -2089.9         Stool Occurrence 1 x       Physical Exam: General: 83 y.o. female in NAD, sleeping, does not respond to voice or sternal rub Cardio: irregularly irregular Lungs: CTAB, on trach-vent Abdomen: Soft, positive hypoactive bowel sounds Skin: warm and dry Extremities: diffusely edematous   Laboratory: I have personally read and reviewed all labs and imaging studies.  CBC: Recent Labs  Lab 08/21/18 0411 08/21/18 1737 08/22/18 0242 08/23/18 0220 08/23/18 1120 08/24/18 0500  WBC 7.8  --  9.1 7.0  --  7.0  NEUTROABS  --   --   --   --   --  PENDING  HGB 6.6* 7.9* 7.5* 6.5* 9.1* 8.7*  HCT 24.8* 27.4* 26.8* 24.1* 32.2* 31.7*  MCV 90.8  --  88.4 90.9  --  89.3  PLT 304  --  312 273  --  161   Basic Metabolic Panel: Recent Labs  Lab 08/19/18 0547  08/21/18 0411 08/22/18 0242 08/23/18 0220 08/23/18 1120 08/24/18 0500  NA 155*   < > 152* 150* 153* 155* 154*  K 3.7   < > 3.6 3.5 3.0* 3.9 4.2  CL 116*   < > 112* 112* 117* 112* 111  CO2 28   < > 29 31 29  32 33*  GLUCOSE 111*   < > 84 117* 106* 112* 105*  BUN 51*   < > 49* 57* 48* 50* 56*  CREATININE 0.48   < > 0.41* 0.50 0.46 0.50 0.56  CALCIUM 9.1   < > 9.3 9.2 8.2* 9.7 9.5  MG  --   --   --   --   --   --  2.5*  PHOS 2.5  --   --   --   --   --   --    < > = values in this interval not displayed.   GFR: Estimated Creatinine Clearance: 56.4 mL/min (by C-G formula based on SCr of 0.56 mg/dL). Liver Function  Tests: No results for input(s): AST, ALT, ALKPHOS, BILITOT, PROT, ALBUMIN in the last 168 hours. CBG: Recent Labs  Lab 08/23/18 1046 08/23/18 1605 08/23/18 2021 08/23/18 2320 08/24/18 0408  GLUCAP 97 110* 96 104* 93   Anemia  Panel: Recent Labs    08/21/18 0852  FERRITIN 104  TIBC 294  IRON 27*   Imaging/Diagnostic Tests: Dg Chest Port 1 View  Result Date: 08/23/2018 CLINICAL DATA:  Bleeding. EXAM: PORTABLE CHEST 1 VIEW COMPARISON:  Radiograph of August 17, 2018. FINDINGS: Stable cardiomegaly. Tracheostomy tube is unchanged in position. Atherosclerosis of thoracic aorta is noted. Left-sided PICC line is unchanged in position. No pneumothorax is noted. Stable bilateral pleural effusions are noted with associated atelectasis, infiltrates or edema. Bony thorax is unremarkable. IMPRESSION: Stable support apparatus. Stable bibasilar opacities as described above. Aortic Atherosclerosis (ICD10-I70.0). Electronically Signed   By: Marijo Conception M.D.   On: 08/23/2018 11:03     Meccariello, Bernita Raisin, DO 08/24/2018, 7:46 AM PGY-1, [place] Intern pager: [phone], text pages welcome

## 2018-08-24 NOTE — Progress Notes (Signed)
Spoke with patient's daughter [name] over the phone.  Updated her on patient's current condition.  Informed her that we are currently holding Eliquis in the setting of patient's anemia and requiring blood transfusions.  Discussed risks and benefits of restarting prior to discharge, and she agrees to have patient discharged without Eliquis and can continue to have risk and benefit discussions with PCP after discharge.    In regards to work up of anemia, she continues to state "I would like everything done."  Advised her that she would likely not be a candidate for EGD.  Pending Hgb tomorrow, she would like to be called and updated and further think about what she would like to do if Hgb continues to decrease.  [place], DO

## 2018-08-24 NOTE — Progress Notes (Addendum)
[facility] reports patient has to be COVID negative before returning. They are agreeing to hold bed while waiting for results. Family refusing to go to any other vent SNF at this time. CSW paged intern pager inquire for patient to be tested for COVID. Waiting for call back.   Update: [facility] is accepting negative COVID test from 4/4 and can take patient back. Per MD patient potentially ready to dc tomorrow 4/17.   [place], [place]

## 2018-08-24 NOTE — Progress Notes (Signed)
Name: Melissa Cochran MRN: [id number] DOB: [birth year] CC: SOB   LOS: 55  Referring Provider:  DR.Mecariello Reason for Referral:  HYPOTENSION  CRITICAL CARE MEDICINE  Brief History   83 year old woman transferred from [place] with fever 102.5, hx of chronic respiratory failure, tracheostomy and chronic ventilator, end stage dementia, PEG dependent, atrial fibrillation on apixaban, HTN, CAD, s/p Stent 7591, Chronic systolic CHF EF 63-84% 6/65/99, nephrolithiasis complicated by hydronephrosis s/p stent placement in 2017, CVA, seizure disorder.   Admitted 4/4 with fever and hypoxemia, septic shock requiring pressors.  Intermittently on pressors 4/7 with ongoing fevers.  Copious secretions, episode of mucus plugging requiring BVM, s/p bronch.     Blood cx 4/4 >> ngtd Urine cx 4/4 >> negative Trach asp 4/7 >> acinetobacter- resistant, only sensitive to unasyn  RVP neg 4/4 COVID >> neg  4/4 Meropenem >> 4/9 4/6 vanc >> 4/7 4/9 unasyn >> stop date 4/15 in place   SUBJECTIVE: Hgb 6.6 this AM.  No signs of bleeding.  Transfusing 1u PRBC. On PRVC: 490 / 20 / 40% / 5  Vital Signs: Temp:  [98.8 F (37.1 C)-100.4 F (38 C)] 100.4 F (38 C) (04/16 0900) Pulse Rate:  [95-134] 106 (04/16 0900) Resp:  [20-24] 20 (04/16 0900) BP: (81-113)/(46-65) 99/58 (04/16 0900) SpO2:  [95 %-100 %] 96 % (04/16 0900) FiO2 (%):  [40 %] 40 % (04/16 0749) Weight:  [80 kg] 80 kg (04/16 0500)  Physical Examination:  General: Chronically ill appearing female,  in NAD. Neuro: Not responsive.  HEENT: [place]/AT. Sclerae anicteric.  Trach C/D/I. Cardiovascular: IRIR, no M/R/G.  Lungs: Respirations even and unlabored.  CTA bilaterally, No W/R/R. Abdomen: BS x 4, soft, NT/ND.  Musculoskeletal: No gross deformities, 2+ edema. In upper and lower extremities.   Skin: Sacral ulcer per report.  Otherwise intact, warm, no rashes.   ASSESSMENT AND PLAN  Acute on chronic resp failure with hypoxemia secondary to  resistant acinetobacter HCAP Hx of MDR acinetobacter RR 23, Pplat 21  Moving air, CTAB.   Cont current settings now.  PRVC 490, PEEP 5, RR 20, breathing over at 23.    Septic shock - resolved, remains off pressors On midodrine.   End stage dementia - bedbound with trach and peg.   Seizure Disorder Continue home Keppra, valproate  Afib on eliquis  Unclear that there is any significant clinical benefit to continuing Eliquis in this setting.  She is profoundly debilitated neurologically  Anemia - worsened 4/13.  No signs of bleeding. Hb 8.7   Critical care time 45 min

## 2018-08-25 LAB — GLUCOSE, CAPILLARY
Glucose-Capillary: 88 mg/dL (ref 70–99)
Glucose-Capillary: 92 mg/dL (ref 70–99)
Glucose-Capillary: 105 mg/dL — ABNORMAL HIGH (ref 70–99)
Glucose-Capillary: 114 mg/dL — ABNORMAL HIGH (ref 70–99)

## 2018-08-25 LAB — CBC WITH DIFFERENTIAL/PLATELET
Abs Immature Granulocytes: 0.03 10*3/uL (ref 0.00–0.07)
Basophils Absolute: 0 10*3/uL (ref 0.0–0.1)
Basophils Relative: 0 %
Eosinophils Absolute: 0.4 10*3/uL (ref 0.0–0.5)
Eosinophils Relative: 6 %
HCT: 31.6 % — ABNORMAL LOW (ref 36.0–46.0)
Hemoglobin: 8.9 g/dL — ABNORMAL LOW (ref 12.0–15.0)
Immature Granulocytes: 0 %
Lymphocytes Relative: 21 %
Lymphs Abs: 1.4 10*3/uL (ref 0.7–4.0)
MCH: 25.7 pg — ABNORMAL LOW (ref 26.0–34.0)
MCHC: 28.2 g/dL — ABNORMAL LOW (ref 30.0–36.0)
MCV: 91.3 fL (ref 80.0–100.0)
Monocytes Absolute: 0.3 10*3/uL (ref 0.1–1.0)
Monocytes Relative: 4 %
Neutro Abs: 4.6 10*3/uL (ref 1.7–7.7)
Neutrophils Relative %: 69 %
Platelets: 303 10*3/uL (ref 150–400)
RBC: 3.46 MIL/uL — ABNORMAL LOW (ref 3.87–5.11)
RDW: 21.8 % — ABNORMAL HIGH (ref 11.5–15.5)
WBC: 6.8 10*3/uL (ref 4.0–10.5)
nRBC: 0.6 % — ABNORMAL HIGH (ref 0.0–0.2)

## 2018-08-25 LAB — BASIC METABOLIC PANEL
Anion gap: 4 — ABNORMAL LOW (ref 5–15)
BUN: 59 mg/dL — ABNORMAL HIGH (ref 8–23)
CO2: 40 mmol/L — ABNORMAL HIGH (ref 22–32)
Calcium: 10 mg/dL (ref 8.9–10.3)
Chloride: 112 mmol/L — ABNORMAL HIGH (ref 98–111)
Creatinine, Ser: 0.58 mg/dL (ref 0.44–1.00)
GFR calc Af Amer: >60 mL/min (ref >60)
GFR calc non Af Amer: >60 mL/min (ref >60)
Glucose, Bld: 115 mg/dL — ABNORMAL HIGH (ref 70–99)
Potassium: 4 mmol/L (ref 3.5–5.1)
Sodium: 156 mmol/L — ABNORMAL HIGH (ref 135–145)

## 2018-08-25 MED ORDER — FREE WATER: 300.0000 mL | Status: DC

## 2018-08-25 MED ORDER — MORPHINE SULFATE 15 MG PO TABS: 7.5000 mg | ORAL_TABLET | Freq: Four times a day (QID) | ORAL | 0 refills | Status: DC | PRN

## 2018-08-25 MED ORDER — POTASSIUM CHLORIDE 20 MEQ/15ML (10%) PO SOLN: 20.0000 meq | Freq: Two times a day (BID) | ORAL | 0 refills | Status: DC

## 2018-08-25 MED ORDER — CARVEDILOL 3.125 MG PO TABS
3.1250 mg | ORAL_TABLET | Freq: Two times a day (BID) | ORAL | Status: DC
  Administered 2018-08-25: 3.125 mg
  Filled 2018-08-25: qty 1

## 2018-08-25 MED ORDER — PANTOPRAZOLE SODIUM 40 MG IV SOLR: 40.0000 mg | Freq: Two times a day (BID) | INTRAVENOUS | Status: DC

## 2018-08-25 MED ORDER — CARVEDILOL 3.125 MG PO TABS: 3.1250 mg | ORAL_TABLET | Freq: Two times a day (BID) | ORAL | Status: DC

## 2018-08-25 MED ORDER — MIDODRINE HCL 2.5 MG PO TABS: 2.5000 mg | ORAL_TABLET | Freq: Two times a day (BID) | ORAL | 0 refills | Status: DC

## 2018-08-25 MED ORDER — FUROSEMIDE 10 MG/ML IJ SOLN: 40.0000 mg | Freq: Two times a day (BID) | INTRAMUSCULAR | 0 refills | Status: DC

## 2018-08-25 MED ORDER — FREE WATER
300.0000 mL | Status: DC
  Administered 2018-08-25 (×2): 300 mL

## 2018-08-25 MED ORDER — HYDROCODONE-ACETAMINOPHEN 5-325 MG PO TABS: 1.0000 | ORAL_TABLET | Freq: Three times a day (TID) | ORAL | 0 refills | Status: DC

## 2018-08-25 NOTE — Progress Notes (Signed)
Interrim PN:   Reached out to gastroenterology for curbside about patient's positive FOBT and recent anemia requiring 2 units of hemoglobin.  Per GI, given that her hemoglobin has been stable with discontinuing her Eliquis and starting IV PPI, GI would not intervene with endoscopy at this time.  However, if patient were to have life-threatening bleed in the same setting, they are happy to be reconsulted for further intervention strategies.  Continue PPI  Continue holding Eliquis  Zettie Cooley, M.D.  Family Medicine  PGY-1 08/25/2018 3:18 PM

## 2018-08-25 NOTE — Progress Notes (Signed)
Nutrition Follow-up  DOCUMENTATION CODES:   Not applicable  INTERVENTION:   Tube Feeding:  Continue Osmolite 1.5 at 40 ml/hr Pro-Stat 60 mL BID Provides 120 g of protein, 1840 kcals and 730 mL of free water  Continue Juven BID, each packet provides 80 calories, 8 grams of carbohydrate, 2.5  grams of protein (collagen), 7 grams of L-arginine and 7 grams of L-glutamine; supplement contains CaHMB, Vitamins C, E, B12 and Zinc to promote wound healing   NUTRITION DIAGNOSIS:   Increased nutrient needs related to wound healing as evidenced by estimated needs.  Being addressed via TF   GOAL:   Patient will meet greater than or equal to 90% of their needs  Progressing  MONITOR:   Vent status, TF tolerance, Labs, Skin, I & O's  REASON FOR ASSESSMENT:   Ventilator, Consult Enteral/tube feeding initiation and management  ASSESSMENT:   83 yo female with PMH of trach, vent dependence, PEG, HTN, HLD, heart block, CHF, CAD, COPD, CVA, and seizures who was admitted from [facility] with fever. R/O COVID-19.  Pt remains on vent support via trach, stable for discharge to [facility]  Osmolite 1.5 at 40 ml/hr, Pro-Stat 60 mL BID Juven BID Free water increased to 300 mL q 4 hours  Labs: sodium 156 (H) Meds: lasix, KCl  Diet Order:   Diet Order    None      EDUCATION NEEDS:   No education needs have been identified at this time  Skin:  Skin Assessment: Skin Integrity Issues: Skin Integrity Issues:: Other (Comment), Unstageable, DTI, Stage IV DTI: L leg Stage IV: sacrum, vertebral column Unstageable: L heel, R heel Other: non pressure wound to L ankle  Last BM:  4/17 rectal tube  Height:   Ht Readings from Last 1 Encounters:  08/12/18 5\' 4"  (1.626 m)    Weight:   Wt Readings from Last 1 Encounters:  08/25/18 80.2 kg    Ideal Body Weight:  54.5 kg  BMI:  Body mass index is 30.35 kg/m.  Estimated Nutritional Needs:   Kcal:  1750  Protein:  120-140 gm  Fluid:   1.8 L  Kerman Passey MS, RD, LDN, CNSC [phone] Pager  [phone] Weekend/On-Call Pager

## 2018-08-25 NOTE — TOC Transition Note (Addendum)
Transition of Care [facility]) - CM/SW Discharge Note   Patient Details  Name: Melissa Cochran MRN: [id number] Date of Birth: 1935-11-15  [facility] CM/SW Contact:  Alberteen Sam, LCSW Phone Number: 08/25/2018, 3:57 PM   Clinical Narrative:     Patient will DC to: [facility] Anticipated DC date: 08/25/2018 Family notified:[facility] [place]:[id number]  Per MD patient ready for DC to [facility] . RN, patient, patient's family, and facility notified of DC. Discharge Summary sent to facility. RN given number for report [phone]. DC packet on chart. [facility] has been called to transport patient.  CSW signing off.  [place], [place]   Final next level of care: Skilled Nursing Facility Barriers to Discharge: No Barriers Identified   Patient Goals and CMS Choice Patient states their goals for this hospitalization and ongoing recovery are:: family wishes patient to return to [facility] vent SNF CMS Medicare.gov Compare Post Acute Care list provided to:: Patient Represenative (must comment)(daughter [name]) Choice offered to / list presented to : Adult Children  Discharge Placement              Patient chooses bed at: Other - please specify in the comment section below:(return to [facility] Vent SNF) Patient to be transferred to facility by: [place] Name of family member notified: [name] (daughter)  Patient and family notified of of transfer: 08/25/18  Discharge Plan and Services     Post Acute Care Choice: [place]          DME Arranged: N/A DME Agency: NA HH Arranged: NA HH Agency: NA   Social Determinants of Health (SDOH) Interventions     Readmission Risk Interventions No flowsheet data found.

## 2018-08-25 NOTE — Progress Notes (Addendum)
Family Medicine Teaching Service Daily Progress Note Intern Pager: [phone]  Patient name: Melissa Cochran Medical record number: [id number] Date of birth: [birth year] Age: 83 y.o. Gender: female  Primary Care Provider: [name], MD Consultants: CCM, SW, Palliative, Wound, CM Code Status: Full Code   Pt Overview and Major Events to Date:  Admitted: 08/12/2018 for CC: Fever  Hospital Day: 14    Assessment and Plan:  Melissa Cochran is a 83 y.o. trach dependent female admitted for bilateral pneumonia and UTI with complicated hospital course and 2IICU admissions. Patient has been vent dependent since 2018.   # Anemia Hemoglobin remained stable today at 8.9, from 8.7 yesterday.  Patient's hemoglobin at baseline is 8-9.  Eliquis was stopped on 4/15 and patient was started on IV Protonix for possible GI bleed.  FOBT was obtained yesterday and positive.  Patient is not a candidate for EGD or colonoscopy; however, as hemoglobin remains stable we will not make any changes at this time.   Patient is otherwise stable for discharge.  If discharged, will need daily hemoglobin to ensure stability. Will need to call [facility] to see if they can provide this.   Continue holding Eliquis   Continue IV PTX   A.m. hemoglobin  GI curbside   Call [facility]   #Fever  Last fever yesterday 0900 of 100.4. No repeat fevers. WBC at 6.8. Finished unasyn on 4/15. No other sites of infection appreciated.   Will continue to monitor   If repeat fever, obtain blood cx x 2  # Hypoxic respiratory failure chronic # Bilateral pneumonia, resolved Trach vent managed by CCM.  7-day course of Unasyn finished for bilateral pneumonia.  Lungs are clear on exam.  Respirations 16-24 .   Can DC PICC line after final dose today  Ipratropium and Xopenex neb 4 times daily  Decrease midodrine to 2.5 mg twice daily, continue to wean with stable BPs  # HFrEF 45-50%, acute on chronic Satting well of trach vent,  40% FiO2.   Free water to 342mjL q 4 hours for hypernatremia  Continue Lasix 40 mg IV BID  Continue 104mEq KCl BID   Strict IO   #Anasarca Anasarca noted on exam on 4/12 and continues.  Free water was decreased. Patient is down -2871.3mL  today.   Continue Lasix 40 mg IV twice daily  Continue 352mL free water every 4 hours  # Hypotension  83-119/52-75 in last 24 hours. Tolerating lasix and free water restriction.   Decreased midodrine to 2.5 mg BID    Monitor as coreg restarted  #Hypervolemic Hypernatremia, chronic  156 today.    Increase free water to 337mL q 4 hours and continue lasix  Daily BMP   Nutrition following  # A fib  HR range from 98-125.  DC eliquis 5mg  BID   PRN metoprolol IV 2.5mg -5mg  q 3 hours for heart rates >120   Restart coreg at lower dose  #Seizure disorder   Continue Keppra  Continue valproate  #Dementia  Dc Valium 2 mg every 6 prn  Delirium precautions  #Chronic pain   Norco 5 mg every 8 scheduled  #Multiple ulcers Varying stages.  No evidence of bleeding from ulcer sites yesterday.  Wound care following  Nursing precautions    #Goals of care Palliative signed off.  Available for questions if needed.  Family wishes to continue to remain full code despite long-term prognosis.  [facility] for [facility] female bed  #FEN/GI:   Fluids KVO  Electrolytes: Potassium:  20 mEq bid KCl  Nutrition: Osmolite 1.540 mL/h, continuous, Protostat 60 mL twice daily  Access: PICC line, 4/6VTE prophylaxis: holding in setting of anemia requiring   Disposition: SNF  pending female bed --  ============================================================= Subjective:  NAEO.   Objective: BP 110/67   Pulse (!) 102   Temp 99.3 F (37.4 C)   Resp 20   Ht 5\' 4"  (1.626 m)   Wt 80.2 kg   SpO2 98%   BMI 30.35 kg/m  Intake/Output      04/16 0701 - 04/17 0700   I.V. (mL/kg) 198.6 (2.5)   Other 160   NG/GT 820   Total  Intake(mL/kg) 1178.6 (14.7)   Urine (mL/kg/hr) 3350 (1.7)   Stool 700   Total Output 4050   Net -2871.5        Physical Exam: General: NAD, non-toxic, pale, elderly woman on trach vent, not alert  Cardiovascular: RRR, normal S1, S2. 2+ RP & DP bilaterally Respiratory: CTAB. No IWOB. Trach vent.  Abdomen: + BS. NT, ND, soft.  Extremities: Warm and well perfused. No spontaneous movement of extremities.  Neuro: pupils so to constrict, eyelids close slowly after manual opening. She does not respond to name or to fingernail squeeze.   Laboratory: I have personally read and reviewed all labs and imaging studies.   Recent Labs  Lab 08/23/18 0220 08/23/18 1120 08/24/18 0500 08/25/18 0314  WBC 7.0  --  7.0 6.8  HGB 6.5* 9.1* 8.7* 8.9*  HCT 24.1* 32.2* 31.7* 31.6*  PLT 273  --  304 303   Recent Labs  Lab 08/23/18 1120 08/24/18 0500 08/25/18 0314  NA 155* 154* 156*  K 3.9 4.2 4.0  CL 112* 111 112*  CO2 32 33* 40*  BUN 50* 56* 59*  CREATININE 0.50 0.56 0.58  CALCIUM 9.7 9.5 10.0  GLUCOSE 112* 105* 115*   CBG: Recent Labs  Lab 08/24/18 0713 08/24/18 1114 08/24/18 1504 08/24/18 2005 08/25/18 0411  GLUCAP 86 107* 117* 115* 88    Imaging/Diagnostic Tests: None in last 24 hours   Melissa Oliphant, MD 08/25/2018, 6:14 AM PGY-1, [place] Intern pager: [phone], text pages welcome

## 2018-08-26 DIAGNOSIS — J90 Pleural effusion, not elsewhere classified: Secondary | ICD-10-CM | POA: Diagnosis not present

## 2018-08-26 DIAGNOSIS — M6289 Other specified disorders of muscle: Secondary | ICD-10-CM | POA: Diagnosis not present

## 2018-08-26 DIAGNOSIS — R918 Other nonspecific abnormal finding of lung field: Secondary | ICD-10-CM | POA: Diagnosis not present

## 2018-08-26 DIAGNOSIS — J9600 Acute respiratory failure, unspecified whether with hypoxia or hypercapnia: Secondary | ICD-10-CM | POA: Diagnosis not present

## 2018-08-27 DIAGNOSIS — M6289 Other specified disorders of muscle: Secondary | ICD-10-CM | POA: Diagnosis not present

## 2018-08-27 LAB — CULTURE, RESPIRATORY W GRAM STAIN

## 2018-08-27 LAB — CULTURE, RESPIRATORY

## 2018-08-28 DIAGNOSIS — M6289 Other specified disorders of muscle: Secondary | ICD-10-CM | POA: Diagnosis not present

## 2018-08-29 DIAGNOSIS — J9621 Acute and chronic respiratory failure with hypoxia: Secondary | ICD-10-CM | POA: Diagnosis not present

## 2018-08-29 DIAGNOSIS — M6289 Other specified disorders of muscle: Secondary | ICD-10-CM | POA: Diagnosis not present

## 2018-08-29 DIAGNOSIS — R1310 Dysphagia, unspecified: Secondary | ICD-10-CM | POA: Diagnosis not present

## 2018-08-29 DIAGNOSIS — J189 Pneumonia, unspecified organism: Secondary | ICD-10-CM | POA: Diagnosis not present

## 2018-08-29 DIAGNOSIS — I4891 Unspecified atrial fibrillation: Secondary | ICD-10-CM | POA: Diagnosis not present

## 2018-08-30 DIAGNOSIS — M6289 Other specified disorders of muscle: Secondary | ICD-10-CM | POA: Diagnosis not present

## 2018-08-31 DIAGNOSIS — M6289 Other specified disorders of muscle: Secondary | ICD-10-CM | POA: Diagnosis not present

## 2018-08-31 DIAGNOSIS — R0989 Other specified symptoms and signs involving the circulatory and respiratory systems: Secondary | ICD-10-CM | POA: Diagnosis not present

## 2018-09-01 DIAGNOSIS — R918 Other nonspecific abnormal finding of lung field: Secondary | ICD-10-CM | POA: Diagnosis not present

## 2018-09-01 DIAGNOSIS — M6289 Other specified disorders of muscle: Secondary | ICD-10-CM | POA: Diagnosis not present

## 2018-09-02 DIAGNOSIS — J918 Pleural effusion in other conditions classified elsewhere: Secondary | ICD-10-CM | POA: Diagnosis not present

## 2018-09-02 DIAGNOSIS — Z9911 Dependence on respirator [ventilator] status: Secondary | ICD-10-CM | POA: Diagnosis not present

## 2018-09-02 DIAGNOSIS — R1311 Dysphagia, oral phase: Secondary | ICD-10-CM | POA: Diagnosis not present

## 2018-09-02 DIAGNOSIS — I4891 Unspecified atrial fibrillation: Secondary | ICD-10-CM | POA: Diagnosis not present

## 2018-09-04 DIAGNOSIS — M6289 Other specified disorders of muscle: Secondary | ICD-10-CM | POA: Diagnosis not present

## 2018-09-05 DIAGNOSIS — Z09 Encounter for follow-up examination after completed treatment for conditions other than malignant neoplasm: Secondary | ICD-10-CM | POA: Diagnosis not present

## 2018-09-05 DIAGNOSIS — M6289 Other specified disorders of muscle: Secondary | ICD-10-CM | POA: Diagnosis not present

## 2018-09-05 DIAGNOSIS — J918 Pleural effusion in other conditions classified elsewhere: Secondary | ICD-10-CM | POA: Diagnosis not present

## 2018-09-05 DIAGNOSIS — J90 Pleural effusion, not elsewhere classified: Secondary | ICD-10-CM | POA: Diagnosis not present

## 2018-09-06 ENCOUNTER — Other Ambulatory Visit: Payer: Self-pay | Admitting: *Deleted

## 2018-09-06 DIAGNOSIS — J9600 Acute respiratory failure, unspecified whether with hypoxia or hypercapnia: Secondary | ICD-10-CM | POA: Diagnosis not present

## 2018-09-06 DIAGNOSIS — M6289 Other specified disorders of muscle: Secondary | ICD-10-CM | POA: Diagnosis not present

## 2018-09-06 NOTE — Patient Outreach (Signed)
[facility]) Care Management  09/06/2018  FRANCHESCA VENEZIANO 10/01/1935 [id number]    Initial Outreach Unsuccessful  RN attempted outreach call to all available contact numbers however invalid. RN will send outreach letter and await a response. If no response will completed a case closure.  Raina Mina, RN Care Management Coordinator [place] Office [phone]

## 2018-09-07 DIAGNOSIS — M6289 Other specified disorders of muscle: Secondary | ICD-10-CM | POA: Diagnosis not present

## 2018-09-08 DIAGNOSIS — M6289 Other specified disorders of muscle: Secondary | ICD-10-CM | POA: Diagnosis not present

## 2018-09-09 DIAGNOSIS — J9600 Acute respiratory failure, unspecified whether with hypoxia or hypercapnia: Secondary | ICD-10-CM | POA: Diagnosis not present

## 2018-09-11 DIAGNOSIS — M6289 Other specified disorders of muscle: Secondary | ICD-10-CM | POA: Diagnosis not present

## 2018-09-12 DIAGNOSIS — R5381 Other malaise: Secondary | ICD-10-CM | POA: Diagnosis not present

## 2018-09-12 DIAGNOSIS — M6281 Muscle weakness (generalized): Secondary | ICD-10-CM | POA: Diagnosis not present

## 2018-09-12 DIAGNOSIS — R2689 Other abnormalities of gait and mobility: Secondary | ICD-10-CM | POA: Diagnosis not present

## 2018-09-12 DIAGNOSIS — R252 Cramp and spasm: Secondary | ICD-10-CM | POA: Diagnosis not present

## 2018-09-12 DIAGNOSIS — M6289 Other specified disorders of muscle: Secondary | ICD-10-CM | POA: Diagnosis not present

## 2018-09-19 DIAGNOSIS — M6289 Other specified disorders of muscle: Secondary | ICD-10-CM | POA: Diagnosis not present

## 2018-09-19 DIAGNOSIS — J9600 Acute respiratory failure, unspecified whether with hypoxia or hypercapnia: Secondary | ICD-10-CM | POA: Diagnosis not present

## 2018-09-23 DIAGNOSIS — J9600 Acute respiratory failure, unspecified whether with hypoxia or hypercapnia: Secondary | ICD-10-CM | POA: Diagnosis not present

## 2018-09-24 DIAGNOSIS — M6289 Other specified disorders of muscle: Secondary | ICD-10-CM | POA: Diagnosis not present

## 2018-09-26 ENCOUNTER — Emergency Department (HOSPITAL_COMMUNITY): Payer: Medicare Other

## 2018-09-26 ENCOUNTER — Inpatient Hospital Stay (HOSPITAL_COMMUNITY)
Admission: EM | Admit: 2018-09-26 | Discharge: 2018-10-05 | DRG: 870 | Disposition: A | Discharge status: Skilled Nursing Facility | Payer: Medicare Other | Source: Skilled Nursing Facility | Attending: Pulmonary Disease | Admitting: Pulmonary Disease

## 2018-09-26 DIAGNOSIS — G9340 Encephalopathy, unspecified: Secondary | ICD-10-CM | POA: Diagnosis present

## 2018-09-26 DIAGNOSIS — E46 Unspecified protein-calorie malnutrition: Secondary | ICD-10-CM | POA: Diagnosis present

## 2018-09-26 DIAGNOSIS — Z8673 Personal history of transient ischemic attack (TIA), and cerebral infarction without residual deficits: Secondary | ICD-10-CM

## 2018-09-26 DIAGNOSIS — J90 Pleural effusion, not elsewhere classified: Secondary | ICD-10-CM | POA: Diagnosis not present

## 2018-09-26 DIAGNOSIS — J95851 Ventilator associated pneumonia: Secondary | ICD-10-CM | POA: Diagnosis present

## 2018-09-26 DIAGNOSIS — E878 Other disorders of electrolyte and fluid balance, not elsewhere classified: Secondary | ICD-10-CM | POA: Diagnosis present

## 2018-09-26 DIAGNOSIS — I110 Hypertensive heart disease with heart failure: Secondary | ICD-10-CM | POA: Diagnosis not present

## 2018-09-26 DIAGNOSIS — N200 Calculus of kidney: Secondary | ICD-10-CM | POA: Diagnosis not present

## 2018-09-26 DIAGNOSIS — Y848 Other medical procedures as the cause of abnormal reaction of the patient, or of later complication, without mention of misadventure at the time of the procedure: Secondary | ICD-10-CM | POA: Diagnosis present

## 2018-09-26 DIAGNOSIS — R402432 Glasgow coma scale score 3-8, at arrival to emergency department: Secondary | ICD-10-CM | POA: Diagnosis present

## 2018-09-26 DIAGNOSIS — G40909 Epilepsy, unspecified, not intractable, without status epilepticus: Secondary | ICD-10-CM | POA: Diagnosis not present

## 2018-09-26 DIAGNOSIS — E870 Hyperosmolality and hypernatremia: Secondary | ICD-10-CM | POA: Diagnosis present

## 2018-09-26 DIAGNOSIS — A419 Sepsis, unspecified organism: Principal | ICD-10-CM | POA: Diagnosis present

## 2018-09-26 DIAGNOSIS — Z1623 Resistance to quinolones and fluoroquinolones: Secondary | ICD-10-CM | POA: Diagnosis present

## 2018-09-26 DIAGNOSIS — J440 Chronic obstructive pulmonary disease with acute lower respiratory infection: Secondary | ICD-10-CM | POA: Diagnosis present

## 2018-09-26 DIAGNOSIS — F0390 Unspecified dementia without behavioral disturbance: Secondary | ICD-10-CM | POA: Diagnosis present

## 2018-09-26 DIAGNOSIS — R403 Persistent vegetative state: Secondary | ICD-10-CM | POA: Diagnosis present

## 2018-09-26 DIAGNOSIS — Z20828 Contact with and (suspected) exposure to other viral communicable diseases: Secondary | ICD-10-CM | POA: Diagnosis present

## 2018-09-26 DIAGNOSIS — B370 Candidal stomatitis: Secondary | ICD-10-CM | POA: Diagnosis not present

## 2018-09-26 DIAGNOSIS — Y95 Nosocomial condition: Secondary | ICD-10-CM | POA: Diagnosis present

## 2018-09-26 DIAGNOSIS — E875 Hyperkalemia: Secondary | ICD-10-CM | POA: Diagnosis present

## 2018-09-26 DIAGNOSIS — Z6824 Body mass index (BMI) 24.0-24.9, adult: Secondary | ICD-10-CM

## 2018-09-26 DIAGNOSIS — N390 Urinary tract infection, site not specified: Secondary | ICD-10-CM | POA: Diagnosis present

## 2018-09-26 DIAGNOSIS — I4891 Unspecified atrial fibrillation: Secondary | ICD-10-CM | POA: Diagnosis not present

## 2018-09-26 DIAGNOSIS — L89159 Pressure ulcer of sacral region, unspecified stage: Secondary | ICD-10-CM | POA: Diagnosis not present

## 2018-09-26 DIAGNOSIS — R509 Fever, unspecified: Secondary | ICD-10-CM | POA: Diagnosis not present

## 2018-09-26 DIAGNOSIS — L89620 Pressure ulcer of left heel, unstageable: Secondary | ICD-10-CM | POA: Diagnosis present

## 2018-09-26 DIAGNOSIS — J151 Pneumonia due to Pseudomonas: Secondary | ICD-10-CM | POA: Diagnosis present

## 2018-09-26 DIAGNOSIS — Z515 Encounter for palliative care: Secondary | ICD-10-CM | POA: Diagnosis not present

## 2018-09-26 DIAGNOSIS — J9610 Chronic respiratory failure, unspecified whether with hypoxia or hypercapnia: Secondary | ICD-10-CM | POA: Diagnosis not present

## 2018-09-26 DIAGNOSIS — D638 Anemia in other chronic diseases classified elsewhere: Secondary | ICD-10-CM | POA: Diagnosis present

## 2018-09-26 DIAGNOSIS — I5022 Chronic systolic (congestive) heart failure: Secondary | ICD-10-CM | POA: Diagnosis present

## 2018-09-26 DIAGNOSIS — E873 Alkalosis: Secondary | ICD-10-CM | POA: Diagnosis present

## 2018-09-26 DIAGNOSIS — J9611 Chronic respiratory failure with hypoxia: Secondary | ICD-10-CM | POA: Diagnosis not present

## 2018-09-26 DIAGNOSIS — I2510 Atherosclerotic heart disease of native coronary artery without angina pectoris: Secondary | ICD-10-CM | POA: Diagnosis not present

## 2018-09-26 DIAGNOSIS — I482 Chronic atrial fibrillation, unspecified: Secondary | ICD-10-CM | POA: Diagnosis present

## 2018-09-26 DIAGNOSIS — Z9911 Dependence on respirator [ventilator] status: Secondary | ICD-10-CM | POA: Diagnosis not present

## 2018-09-26 DIAGNOSIS — B964 Proteus (mirabilis) (morganii) as the cause of diseases classified elsewhere: Secondary | ICD-10-CM | POA: Diagnosis present

## 2018-09-26 DIAGNOSIS — A427 Actinomycotic sepsis: Secondary | ICD-10-CM | POA: Diagnosis not present

## 2018-09-26 DIAGNOSIS — R1310 Dysphagia, unspecified: Secondary | ICD-10-CM

## 2018-09-26 DIAGNOSIS — Z79891 Long term (current) use of opiate analgesic: Secondary | ICD-10-CM

## 2018-09-26 DIAGNOSIS — Z823 Family history of stroke: Secondary | ICD-10-CM

## 2018-09-26 DIAGNOSIS — A420 Pulmonary actinomycosis: Secondary | ICD-10-CM | POA: Diagnosis not present

## 2018-09-26 DIAGNOSIS — D649 Anemia, unspecified: Secondary | ICD-10-CM | POA: Diagnosis not present

## 2018-09-26 DIAGNOSIS — J189 Pneumonia, unspecified organism: Secondary | ICD-10-CM | POA: Diagnosis not present

## 2018-09-26 DIAGNOSIS — Z7189 Other specified counseling: Secondary | ICD-10-CM

## 2018-09-26 DIAGNOSIS — L03311 Cellulitis of abdominal wall: Secondary | ICD-10-CM | POA: Diagnosis present

## 2018-09-26 DIAGNOSIS — J9621 Acute and chronic respiratory failure with hypoxia: Secondary | ICD-10-CM | POA: Diagnosis present

## 2018-09-26 DIAGNOSIS — Z931 Gastrostomy status: Secondary | ICD-10-CM

## 2018-09-26 DIAGNOSIS — Z930 Tracheostomy status: Secondary | ICD-10-CM | POA: Diagnosis not present

## 2018-09-26 DIAGNOSIS — L89109 Pressure ulcer of unspecified part of back, unspecified stage: Secondary | ICD-10-CM | POA: Diagnosis not present

## 2018-09-26 DIAGNOSIS — R9401 Abnormal electroencephalogram [EEG]: Secondary | ICD-10-CM | POA: Diagnosis present

## 2018-09-26 DIAGNOSIS — Z86718 Personal history of other venous thrombosis and embolism: Secondary | ICD-10-CM

## 2018-09-26 DIAGNOSIS — L89629 Pressure ulcer of left heel, unspecified stage: Secondary | ICD-10-CM | POA: Diagnosis not present

## 2018-09-26 DIAGNOSIS — L89152 Pressure ulcer of sacral region, stage 2: Secondary | ICD-10-CM | POA: Diagnosis present

## 2018-09-26 DIAGNOSIS — R6521 Severe sepsis with septic shock: Secondary | ICD-10-CM | POA: Diagnosis present

## 2018-09-26 DIAGNOSIS — N179 Acute kidney failure, unspecified: Secondary | ICD-10-CM | POA: Diagnosis present

## 2018-09-26 DIAGNOSIS — E860 Dehydration: Secondary | ICD-10-CM | POA: Diagnosis present

## 2018-09-26 DIAGNOSIS — Z955 Presence of coronary angioplasty implant and graft: Secondary | ICD-10-CM

## 2018-09-26 DIAGNOSIS — R590 Localized enlarged lymph nodes: Secondary | ICD-10-CM | POA: Diagnosis present

## 2018-09-26 DIAGNOSIS — L89619 Pressure ulcer of right heel, unspecified stage: Secondary | ICD-10-CM | POA: Diagnosis not present

## 2018-09-26 DIAGNOSIS — D329 Benign neoplasm of meninges, unspecified: Secondary | ICD-10-CM | POA: Diagnosis present

## 2018-09-26 DIAGNOSIS — Z8701 Personal history of pneumonia (recurrent): Secondary | ICD-10-CM

## 2018-09-26 DIAGNOSIS — L89522 Pressure ulcer of left ankle, stage 2: Secondary | ICD-10-CM | POA: Diagnosis present

## 2018-09-26 DIAGNOSIS — L8990 Pressure ulcer of unspecified site, unspecified stage: Secondary | ICD-10-CM

## 2018-09-26 DIAGNOSIS — Z87891 Personal history of nicotine dependence: Secondary | ICD-10-CM

## 2018-09-26 DIAGNOSIS — I252 Old myocardial infarction: Secondary | ICD-10-CM

## 2018-09-26 DIAGNOSIS — L89300 Pressure ulcer of unspecified buttock, unstageable: Secondary | ICD-10-CM | POA: Diagnosis present

## 2018-09-26 DIAGNOSIS — R918 Other nonspecific abnormal finding of lung field: Secondary | ICD-10-CM | POA: Diagnosis not present

## 2018-09-26 LAB — CBC WITH DIFFERENTIAL/PLATELET
Abs Immature Granulocytes: 0.11 10*3/uL — ABNORMAL HIGH (ref 0.00–0.07)
Basophils Absolute: 0.1 10*3/uL (ref 0.0–0.1)
Basophils Relative: 1 %
Eosinophils Absolute: 0.1 10*3/uL (ref 0.0–0.5)
Eosinophils Relative: 0 %
HCT: 36.7 % (ref 36.0–46.0)
Hemoglobin: 9.9 g/dL — ABNORMAL LOW (ref 12.0–15.0)
Immature Granulocytes: 1 %
Lymphocytes Relative: 10 %
Lymphs Abs: 1.8 10*3/uL (ref 0.7–4.0)
MCH: 26.1 pg (ref 26.0–34.0)
MCHC: 27 g/dL — ABNORMAL LOW (ref 30.0–36.0)
MCV: 96.6 fL (ref 80.0–100.0)
Monocytes Absolute: 0.7 10*3/uL (ref 0.1–1.0)
Monocytes Relative: 4 %
Neutro Abs: 15 10*3/uL — ABNORMAL HIGH (ref 1.7–7.7)
Neutrophils Relative %: 84 %
Platelets: 245 10*3/uL (ref 150–400)
RBC: 3.8 MIL/uL — ABNORMAL LOW (ref 3.87–5.11)
RDW: 19.9 % — ABNORMAL HIGH (ref 11.5–15.5)
WBC: 17.7 10*3/uL — ABNORMAL HIGH (ref 4.0–10.5)
nRBC: 0.1 % (ref 0.0–0.2)

## 2018-09-26 LAB — URINALYSIS, ROUTINE W REFLEX MICROSCOPIC
Bilirubin Urine: NEGATIVE
Glucose, UA: NEGATIVE mg/dL
Hgb urine dipstick: NEGATIVE
Ketones, ur: NEGATIVE mg/dL
Nitrite: NEGATIVE
Protein, ur: 100 mg/dL — AB
Specific Gravity, Urine: 1.018 (ref 1.005–1.030)
pH: 9 — ABNORMAL HIGH (ref 5.0–8.0)

## 2018-09-26 LAB — POCT I-STAT 7, (LYTES, BLD GAS, ICA,H+H)
Acid-Base Excess: 8 mmol/L — ABNORMAL HIGH (ref 0.0–2.0)
Bicarbonate: 32 mmol/L — ABNORMAL HIGH (ref 20.0–28.0)
Calcium, Ion: 1.4 mmol/L (ref 1.15–1.40)
HCT: 29 % — ABNORMAL LOW (ref 36.0–46.0)
Hemoglobin: 9.9 g/dL — ABNORMAL LOW (ref 12.0–15.0)
O2 Saturation: 93 %
Patient temperature: 99.9
Potassium: 5.2 mmol/L — ABNORMAL HIGH (ref 3.5–5.1)
Sodium: 151 mmol/L — ABNORMAL HIGH (ref 135–145)
TCO2: 33 mmol/L — ABNORMAL HIGH (ref 22–32)
pCO2 arterial: 44.2 mmHg (ref 32.0–48.0)
pH, Arterial: 7.47 — ABNORMAL HIGH (ref 7.350–7.450)
pO2, Arterial: 65 mmHg — ABNORMAL LOW (ref 83.0–108.0)

## 2018-09-26 LAB — GLUCOSE, CAPILLARY: Glucose-Capillary: 86 mg/dL (ref 70–99)

## 2018-09-26 LAB — COMPREHENSIVE METABOLIC PANEL
ALT: 28 U/L (ref 0–44)
AST: 35 U/L (ref 15–41)
Albumin: 1.8 g/dL — ABNORMAL LOW (ref 3.5–5.0)
Alkaline Phosphatase: 80 U/L (ref 38–126)
Anion gap: 9 (ref 5–15)
BUN: 109 mg/dL — ABNORMAL HIGH (ref 8–23)
CO2: 29 mmol/L (ref 22–32)
Calcium: 10 mg/dL (ref 8.9–10.3)
Chloride: 112 mmol/L — ABNORMAL HIGH (ref 98–111)
Creatinine, Ser: 0.79 mg/dL (ref 0.44–1.00)
GFR calc Af Amer: >60 mL/min (ref >60)
GFR calc non Af Amer: >60 mL/min (ref >60)
Glucose, Bld: 137 mg/dL — ABNORMAL HIGH (ref 70–99)
Potassium: 5.3 mmol/L — ABNORMAL HIGH (ref 3.5–5.1)
Sodium: 150 mmol/L — ABNORMAL HIGH (ref 135–145)
Total Bilirubin: 0.7 mg/dL (ref 0.3–1.2)
Total Protein: 7.1 g/dL (ref 6.5–8.1)

## 2018-09-26 LAB — MAGNESIUM: Magnesium: 3.4 mg/dL — ABNORMAL HIGH (ref 1.7–2.4)

## 2018-09-26 LAB — SARS CORONAVIRUS 2 BY RT PCR (HOSPITAL ORDER, PERFORMED IN ~~LOC~~ HOSPITAL LAB): SARS Coronavirus 2: NEGATIVE

## 2018-09-26 LAB — LACTIC ACID, PLASMA: Lactic Acid, Venous: 1.7 mmol/L (ref 0.5–1.9)

## 2018-09-26 MED ORDER — JUVEN PO PACK
1.0000 | PACK | Freq: Two times a day (BID) | ORAL | Status: DC
  Administered 2018-09-27: 1
  Filled 2018-09-26 (×2): qty 1

## 2018-09-26 MED ORDER — MORPHINE SULFATE 15 MG PO TABS: 7.5000 mg | ORAL_TABLET | Freq: Four times a day (QID) | ORAL | Status: DC | PRN

## 2018-09-26 MED ORDER — ACETAMINOPHEN 160 MG/5ML PO SOLN
650.0000 mg | Freq: Four times a day (QID) | ORAL | Status: DC | PRN
  Administered 2018-10-02 – 2018-10-04 (×3): 650 mg
  Filled 2018-09-26 (×3): qty 20.3

## 2018-09-26 MED ORDER — HYDROCODONE-ACETAMINOPHEN 5-325 MG PO TABS
1.0000 | ORAL_TABLET | Freq: Three times a day (TID) | ORAL | Status: DC
  Administered 2018-09-27 – 2018-09-29 (×7): 1
  Filled 2018-09-26 (×7): qty 1

## 2018-09-26 MED ORDER — PIPERACILLIN-TAZOBACTAM 3.375 G IVPB 30 MIN
3.3750 g | Freq: Once | INTRAVENOUS | Status: AC
  Administered 2018-09-26: 20:00:00 3.375 g via INTRAVENOUS
  Filled 2018-09-26: qty 50

## 2018-09-26 MED ORDER — VALPROIC ACID 250 MG/5ML PO SOLN
250.0000 mg | Freq: Two times a day (BID) | ORAL | Status: DC
  Administered 2018-09-27 – 2018-10-05 (×18): 250 mg
  Filled 2018-09-26 (×18): qty 5

## 2018-09-26 MED ORDER — CHLORHEXIDINE GLUCONATE 0.12% ORAL RINSE (MEDLINE KIT): 5.0000 mL | Freq: Two times a day (BID) | OROMUCOSAL | Status: DC

## 2018-09-26 MED ORDER — MIDODRINE HCL 2.5 MG PO TABS
2.5000 mg | ORAL_TABLET | Freq: Two times a day (BID) | ORAL | Status: DC
  Filled 2018-09-26: qty 1

## 2018-09-26 MED ORDER — IPRATROPIUM-ALBUTEROL 0.5-2.5 (3) MG/3ML IN SOLN: 3.0000 mL | RESPIRATORY_TRACT | Status: DC | PRN

## 2018-09-26 MED ORDER — VANCOMYCIN HCL 500 MG IV SOLR
500.0000 mg | Freq: Once | INTRAVENOUS | Status: AC
  Administered 2018-09-27: 500 mg via INTRAVENOUS
  Filled 2018-09-26: qty 500

## 2018-09-26 MED ORDER — SODIUM CHLORIDE 0.9 % IV SOLN
3.0000 g | Freq: Four times a day (QID) | INTRAVENOUS | Status: DC
  Administered 2018-09-27 – 2018-09-28 (×6): 3 g via INTRAVENOUS
  Filled 2018-09-26 (×11): qty 3

## 2018-09-26 MED ORDER — DEXTROSE 5 % IV SOLN: INTRAVENOUS | Status: DC

## 2018-09-26 MED ORDER — PANTOPRAZOLE SODIUM 40 MG PO PACK
40.0000 mg | PACK | Freq: Every day | ORAL | Status: DC
  Administered 2018-09-27 – 2018-10-05 (×9): 40 mg
  Filled 2018-09-26 (×9): qty 20

## 2018-09-26 MED ORDER — JEVITY 1.2 CAL PO LIQD
70.0000 mL | Freq: Two times a day (BID) | ORAL | Status: DC
  Administered 2018-09-27: 10:00:00 70 mL
  Filled 2018-09-26: qty 237

## 2018-09-26 MED ORDER — CARVEDILOL 3.125 MG PO TABS: 3.1250 mg | ORAL_TABLET | Freq: Two times a day (BID) | ORAL | Status: DC

## 2018-09-26 MED ORDER — LEVETIRACETAM 100 MG/ML PO SOLN
1000.0000 mg | Freq: Two times a day (BID) | ORAL | Status: DC
  Administered 2018-09-27 – 2018-10-05 (×18): 1000 mg
  Filled 2018-09-26 (×20): qty 10

## 2018-09-26 MED ORDER — SODIUM CHLORIDE 0.9 % IV BOLUS
500.0000 mL | Freq: Once | INTRAVENOUS | Status: AC
  Administered 2018-09-26: 500 mL via INTRAVENOUS

## 2018-09-26 MED ORDER — ASPIRIN 81 MG PO CHEW
81.0000 mg | CHEWABLE_TABLET | Freq: Every day | ORAL | Status: DC
  Administered 2018-09-27 – 2018-10-05 (×9): 81 mg
  Filled 2018-09-26 (×9): qty 1

## 2018-09-26 MED ORDER — VANCOMYCIN HCL 10 G IV SOLR
1250.0000 mg | INTRAVENOUS | Status: DC
  Administered 2018-09-27: 1250 mg via INTRAVENOUS
  Filled 2018-09-26 (×2): qty 1250

## 2018-09-26 MED ORDER — ENOXAPARIN SODIUM 40 MG/0.4ML ~~LOC~~ SOLN
40.0000 mg | SUBCUTANEOUS | Status: DC
  Administered 2018-09-27 – 2018-10-05 (×9): 40 mg via SUBCUTANEOUS
  Filled 2018-09-26 (×9): qty 0.4

## 2018-09-26 MED ORDER — ADULT MULTIVITAMIN LIQUID CH
15.0000 mL | Freq: Every day | ORAL | Status: DC
  Administered 2018-09-27 – 2018-10-05 (×9): 15 mL
  Filled 2018-09-26 (×9): qty 15

## 2018-09-26 MED ORDER — FREE WATER
300.0000 mL | Freq: Four times a day (QID) | Status: DC
  Administered 2018-09-27 (×2): 300 mL

## 2018-09-26 MED ORDER — VANCOMYCIN HCL IN DEXTROSE 1-5 GM/200ML-% IV SOLN
1000.0000 mg | Freq: Once | INTRAVENOUS | Status: AC
  Administered 2018-09-26: 20:00:00 1000 mg via INTRAVENOUS
  Filled 2018-09-26: qty 200

## 2018-09-26 MED ORDER — PRO-STAT SUGAR FREE PO LIQD
30.0000 mL | Freq: Two times a day (BID) | ORAL | Status: DC
  Administered 2018-09-27 (×2): 30 mL
  Filled 2018-09-26 (×2): qty 30

## 2018-09-26 MED ORDER — DEXTROSE 5 % IV SOLN
INTRAVENOUS | Status: DC
  Administered 2018-09-26 – 2018-09-28 (×3): via INTRAVENOUS

## 2018-09-26 NOTE — ED Notes (Signed)
Picc liine verified by CXR and MD.

## 2018-09-26 NOTE — ED Notes (Addendum)
Report given to 2M RN. All questions answered 

## 2018-09-26 NOTE — ED Notes (Signed)
Report given to [facility] RN. All questions answered

## 2018-09-26 NOTE — ED Triage Notes (Signed)
Pt sent to ER for treatment of elevated BUN and creat.   No other changes in patients baseline reported by staff.  Pt non-verbal and non-responsive to speech

## 2018-09-26 NOTE — Progress Notes (Signed)
Pharmacy Antibiotic Note  Melissa Cochran is a 83 y.o. female admitted on 09/26/2018 with sepsis.  Pharmacy has been consulted for unasyn and vancomycin dosing.  CXR showing interval improvement in basilar aeration with persistent bibasilar airspace opacities. WBC up at 17.7, LA 1.7, temp 101.1. Has hx of MDRO with Acetinobacter that was sensitive to unasyn. Scr 0.79 (CrCl 59 mL/min).   Plan: Vancomycin 1500 mg IV loading dose then 1250 mg IV every 24 hours (estAUC 455)  Unasyn 3 g IV every 6 hours Monitor renal fx, cx results, clinical pic, and vanc levels as appropriate  Height: 5\' 7"  (170.2 cm) Weight: 170 lb (77.1 kg) IBW/kg (Calculated) : 61.6  Temp (24hrs), Avg:100.5 F (38.1 C), Min:99.9 F (37.7 C), Max:101.1 F (38.4 C)  Recent Labs  Lab 09/26/18 1749 09/26/18 1751  WBC 17.7*  --   CREATININE 0.79  --   LATICACIDVEN  --  1.7    Estimated Creatinine Clearance: 59 mL/min (by C-G formula based on SCr of 0.79 mg/dL).    No Known Allergies  Antimicrobials this admission: Vancomycin 5/19 >>  Unasyn 5/19 >>   Dose adjustments this admission: N/A  Microbiology results: 5/19 BCx: sent 5/19 UCx: sent 5/19 COVID: neg   Thank you for allowing pharmacy to be a part of this patient's care.  Antonietta Jewel, PharmD, [place] Clinical Pharmacist  Pager: [phone] Phone: [phone] 09/26/2018 9:45 PM

## 2018-09-26 NOTE — Progress Notes (Signed)
Patient transferred to 3M5 from the ER w/o complications. Uneventful trip. Report given to unit RRT.

## 2018-09-26 NOTE — ED Provider Notes (Signed)
[facility] EMERGENCY DEPARTMENT Provider Note   CSN: [id number] Arrival date & time: 09/26/18  1643    History   Chief Complaint Chief Complaint  Patient presents with  . vent/ elevated renal    HPI Melissa Cochran is a 83 y.o. female.     HPI Patient trach and vent dependent transferred from [facility] for hyperkalemia and renal insufficiency.  Patient is nonverbal at baseline.  Unable to contribute to history.  Level 5 caveat. Past Medical History:  Diagnosis Date  . Adnexal mass   . Anxiety   . Arthritis   . Bladder tumor   . Bleeding ulcer   . CHF (congestive heart failure) ([place])   . CHF (congestive heart failure) ([place])   . Chronic bronchitis ([place])   . Chronic respiratory failure ([place]) 04/19/2018  . COPD (chronic obstructive pulmonary disease) ([place])   . Coronary artery disease   . CVA (cerebral vascular accident) ([place]) 04/19/2018  . DVT (deep venous thrombosis) (HCC)    RLE  . GERD (gastroesophageal reflux disease)   . Heart block    left  . History of bleeding ulcers   . History of kidney stones   . Hyperlipemia   . Hypertension   . Myocardial infarction ([place])    "slight one" (07/05/2017)  . Pneumonia    "several times" (07/05/2017)  . Seizures ([place]) 02/2017 "several"; 06/26/2017 X 1  . Shortness of breath dyspnea   . Stroke [facility]) 02/13/2018   "still right sided weakness" (07/05/2017)    Patient Active Problem List   Diagnosis Date Noted  . Sepsis ([place]) 09/26/2018  . Hypernatremia 09/26/2018  . AKI (acute kidney injury) ([place]) 09/26/2018  . Goals of care, counseling/discussion   . Palliative care by specialist   . Sepsis with acute organ dysfunction without septic shock ([place])   . Bilateral pneumonia 08/12/2018  . Acute respiratory failure with hypoxia ([place])   . CVA (cerebral vascular accident) ([place]) 04/19/2018  . Chronic respiratory failure ([place]) 04/19/2018  . Acute encephalopathy 04/18/2018  . E-coli UTI, ESBL  06/30/2017  .  A-fib ([place]) 06/30/2017  . Need for protective airway ventilation 06/30/2017  . Seizures ([place]) 06/26/2017  . Acute renal failure ([place])   . Elevated troponin I level   . Acute renal failure with tubular necrosis ([place])   . Palliative care encounter   . Pressure ulcer 02/17/2017  . Bilateral lower extremity edema 10/08/2016  . Venous ulcer of ankle ([place]) 07/08/2016  . DVT (deep venous thrombosis) ([place]) 07/02/2016  . Swelling of limb 06/08/2016  . Pain in limb 06/08/2016  . Altered mental status 01/22/2016  . S/P coronary artery stent placement 10/30/2015  . Preop cardiovascular exam 10/08/2015  . SOB (shortness of breath) on exertion 10/08/2015  . Malignant neoplasm of trigone of bladder ([place]) 09/29/2015  . Mass of lower lobe of left lung, incidetnal on CT 09/2015, smoker.  09/29/2015  . Other disorders of lung 09/29/2015  . Other microscopic hematuria 08/26/2015  . Kidney stone 08/26/2015  . Urge incontinence 08/26/2015  . Pneumonia 01/24/2015  . PNA (pneumonia) 01/24/2015  . Essential (primary) hypertension 12/09/2014  . Adnexal mass 11/28/2013  . History of cardiac catheterization 11/28/2013  . Hyperlipidemia 11/28/2013  . Other specified postprocedural states 11/28/2013    Past Surgical History:  Procedure Laterality Date  . ABDOMINAL HYSTERECTOMY    . CARDIAC CATHETERIZATION Left 10/21/2015   Procedure: Left Heart Cath and Coronary Angiography;  Surgeon: Isaias Cowman, MD;  Location:  [facility] CV LAB;  Service: Cardiovascular;  Laterality: Left;  . CARDIAC CATHETERIZATION N/A 10/21/2015   Procedure: Coronary Stent Intervention;  Surgeon: Isaias Cowman, MD;  Location: [facility] CV LAB;  Service: Cardiovascular;  Laterality: N/A;  . CARDIAC CATHETERIZATION    . CYSTOSCOPY W/ RETROGRADES Bilateral 01/20/2016   Procedure: CYSTOSCOPY WITH RETROGRADE PYELOGRAM;  Surgeon: Hollice Espy, MD;  Location: [facility] ORS;  Service: Urology;  Laterality: Bilateral;  .  CYSTOSCOPY WITH STENT PLACEMENT Right 01/20/2016   Procedure: CYSTOSCOPY WITH STENT PLACEMENT;  Surgeon: Hollice Espy, MD;  Location: [facility] ORS;  Service: Urology;  Laterality: Right;  . HIP FRACTURE SURGERY Left   . STOMACH SURGERY     "stomach ulcers"  . TRACHEOSTOMY TUBE PLACEMENT N/A 03/02/2017   Procedure: TRACHEOSTOMY;  Surgeon: Rozetta Nunnery, MD;  Location: [facility];  Service: ENT;  Laterality: N/A;  . TRANSURETHRAL RESECTION OF BLADDER TUMOR N/A 01/20/2016   Procedure: TRANSURETHRAL RESECTION OF BLADDER TUMOR (TURBT);  Surgeon: Hollice Espy, MD;  Location: [facility] ORS;  Service: Urology;  Laterality: N/A;     OB History   No obstetric history on file.      Home Medications    Prior to Admission medications   Medication Sig Start Date End Date Taking? Authorizing Provider  acetaminophen (TYLENOL) 160 MG/5ML solution Place 20.3 mLs (650 mg total) into feeding tube every 6 (six) hours as needed for mild pain, headache or fever. Patient not taking: Reported on 08/12/2018 06/19/18   Cherene Altes, MD  Amino Acids-Protein Hydrolys (FEEDING SUPPLEMENT, PRO-STAT SUGAR FREE 64,) LIQD Place 30 mLs into feeding tube 2 (two) times daily. 06/26/18   Cherene Altes, MD  aspirin 81 MG chewable tablet Place 1 tablet (81 mg total) into feeding tube daily. Patient not taking: Reported on 08/12/2018 06/19/18   Cherene Altes, MD  carvedilol (COREG) 3.125 MG tablet Place 1 tablet (3.125 mg total) into feeding tube 2 (two) times daily with a meal. 08/25/18   Wilber Oliphant, MD  chlorhexidine (PERIDEX) 0.12 % solution 5 mLs See admin instructions. 5 ml's for trach care 2 times a day    [provider]  collagenase (SANTYL) ointment Apply topically daily. Patient not taking: Reported on 08/12/2018 06/18/18   Georgette Shell, MD  famotidine (PEPCID) 20 MG tablet Place 20 mg into feeding tube daily.     [provider]  furosemide (LASIX) 10 MG/ML injection Inject 4 mLs (40 mg  total) into the vein 2 (two) times daily. 08/25/18   Wilber Oliphant, MD  HYDROcodone-acetaminophen (NORCO/VICODIN) 5-325 MG tablet Place 1 tablet into feeding tube every 8 (eight) hours. 08/25/18   Wilber Oliphant, MD  ipratropium-albuterol (DUONEB) 0.5-2.5 (3) MG/3ML SOLN Take 3 mLs by nebulization as needed (for acute and chronic respiratory failure, unspecified whether with hypoxia or hypercapnia).     [provider]  levETIRAcetam (KEPPRA) 100 MG/ML solution Place 10 mLs (1,000 mg total) into feeding tube 2 (two) times daily. 06/19/18   Cherene Altes, MD  midodrine (PROAMATINE) 2.5 MG tablet Take 1 tablet (2.5 mg total) by mouth 2 (two) times daily with a meal. 08/25/18   Wilber Oliphant, MD  morphine (MSIR) 15 MG tablet Place 0.5 tablets (7.5 mg total) into feeding tube every 6 (six) hours as needed (for dyspnea). 08/25/18   Wilber Oliphant, MD  Multiple Vitamin (MULTIVITAMIN) LIQD Place 15 mLs into feeding tube daily. Patient not taking: Reported on 08/12/2018 06/19/18  Cherene Altes, MD  Multiple Vitamin (MULTIVITAMIN) tablet Place 1 tablet into feeding tube daily.    [provider]  nutrition supplement, JUVEN, (JUVEN) PACK Place 1 packet into feeding tube 2 (two) times daily between meals. 06/19/18   Cherene Altes, MD  Nutritional Supplements (FEEDING SUPPLEMENT, VITAL AF 1.2 CAL,) LIQD Place 1,000 mLs into feeding tube continuous. Patient not taking: Reported on 08/12/2018 06/26/18   Cherene Altes, MD  Nutritional Supplements (ISOSOURCE 1.5 CAL) LIQD Place 70 mLs into feeding tube 2 (two) times daily.     [provider]  pantoprazole (PROTONIX) 40 MG injection Inject 40 mg into the vein every 12 (twelve) hours for 4 days. 08/25/18 08/29/18  Wilber Oliphant, MD  potassium chloride 20 MEQ/15ML (10%) SOLN Place 15 mLs (20 mEq total) into feeding tube 2 (two) times daily. 08/25/18   Wilber Oliphant, MD  Sodium Chloride Flush (NORMAL SALINE FLUSH IV) Inject 10 mLs into  the vein See admin instructions. Use 10 ml's via IV every shift for any used lumens- flush each unused lumen    [provider]  Valproate Sodium (DEPAKENE) 250 MG/5ML SOLN solution Place 5 mLs (250 mg total) into feeding tube 2 (two) times daily. 06/19/18   Cherene Altes, MD  Water For Irrigation, Sterile (FREE WATER) SOLN Place 300 mLs into feeding tube every 4 (four) hours. 08/25/18   Wilber Oliphant, MD    Family History Family History  Problem Relation Age of Onset  . Stomach cancer Mother   . CVA Father   . Bladder Cancer Neg Hx   . Kidney cancer Neg Hx     Social History Social History   Tobacco Use  . Smoking status: Former Smoker    Packs/day: 1.00    Years: 40.00    Pack years: 40.00    Last attempt to quit: 06/13/1998    Years since quitting: 20.3  . Smokeless tobacco: Never Used  Substance Use Topics  . Alcohol use: No  . Drug use: No     Allergies   Patient has no known allergies.   Review of Systems Review of Systems  Unable to perform ROS: Patient nonverbal     Physical Exam Updated Vital Signs BP 101/61   Pulse 82   Temp (!) 101.1 F (38.4 C) (Rectal)   Resp 18   Ht '5\' 7"'$  (1.702 m)   Wt 77.1 kg   SpO2 96%   BMI 26.63 kg/m   Physical Exam Vitals signs and nursing note reviewed.  Constitutional:      Appearance: She is well-developed.     Comments: Chronically ill-appearing  HENT:     Head: Normocephalic and atraumatic.  Eyes:     Comments: Pupils 3 mm and sluggish  Neck:     Musculoskeletal: Normal range of motion and neck supple.  Cardiovascular:     Rate and Rhythm: Normal rate and regular rhythm.  Pulmonary:     Effort: Pulmonary effort is normal.     Breath sounds: Normal breath sounds.     Comments: Scattered rhonchi Abdominal:     General: Bowel sounds are normal.     Palpations: Abdomen is soft.     Tenderness: There is no abdominal tenderness. There is no guarding or rebound.     Comments: G-tube in place, right  nephrostomy tube in place  Musculoskeletal: Normal range of motion.        General: No swelling, tenderness, deformity or  signs of injury.     Right lower leg: No edema.     Left lower leg: No edema.  Skin:    General: Skin is warm and dry.     Findings: No erythema or rash.     Comments: Patient with bilateral calcaneal skin wounds but no obvious evidence of infection  Neurological:     Comments: Unresponsive.  Nonverbal.  Psychiatric:        Behavior: Behavior normal.      ED Treatments / Results  Labs (all labs ordered are listed, but only abnormal results are displayed) Labs Reviewed  CBC WITH DIFFERENTIAL/PLATELET - Abnormal; Notable for the following components:      Result Value   WBC 17.7 (*)    RBC 3.80 (*)    Hemoglobin 9.9 (*)    MCHC 27.0 (*)    RDW 19.9 (*)    Neutro Abs 15.0 (*)    Abs Immature Granulocytes 0.11 (*)    All other components within normal limits  COMPREHENSIVE METABOLIC PANEL - Abnormal; Notable for the following components:   Sodium 150 (*)    Potassium 5.3 (*)    Chloride 112 (*)    Glucose, Bld 137 (*)    BUN 109 (*)    Albumin 1.8 (*)    All other components within normal limits  URINALYSIS, ROUTINE W REFLEX MICROSCOPIC - Abnormal; Notable for the following components:   APPearance CLOUDY (*)    pH 9.0 (*)    Protein, ur 100 (*)    Leukocytes,Ua LARGE (*)    Bacteria, UA FEW (*)    All other components within normal limits  MAGNESIUM - Abnormal; Notable for the following components:   Magnesium 3.4 (*)    All other components within normal limits  POCT I-STAT 7, (LYTES, BLD GAS, ICA,H+H) - Abnormal; Notable for the following components:   pH, Arterial 7.470 (*)    pO2, Arterial 65.0 (*)    Bicarbonate 32.0 (*)    TCO2 33 (*)    Acid-Base Excess 8.0 (*)    Sodium 151 (*)    Potassium 5.2 (*)    HCT 29.0 (*)    Hemoglobin 9.9 (*)    All other components within normal limits  SARS CORONAVIRUS 2 (HOSPITAL ORDER, [facility])  CULTURE, BLOOD (ROUTINE X 2)  CULTURE, BLOOD (ROUTINE X 2)  URINE CULTURE  LACTIC ACID, PLASMA  BLOOD GAS, ARTERIAL  BASIC METABOLIC PANEL  CBC  MAGNESIUM    EKG EKG Interpretation  Date/Time:  Tuesday Sep 26 2018 16:46:25 EDT Ventricular Rate:  102 PR Interval:    QRS Duration: 76 QT Interval:  342 QTC Calculation: 446 R Axis:   13 Text Interpretation:  Sinus tachycardia with irregular rate Anterior infarct, old Confirmed by Julianne Rice [phone]) on 09/26/2018 10:59:24 PM   Radiology Dg Chest Port 1 View  Result Date: 09/26/2018 CLINICAL DATA:  Elevated BUN and creatinine. EXAM: PORTABLE CHEST 1 VIEW COMPARISON:  08/23/2018 FINDINGS: Tracheostomy tube noted. Right chest tube visualized in situ. Left PICC line tip overlies the distal SVC. Interstitial markings are diffusely coarsened with chronic features. Patchy bibasilar airspace disease noted, right greater than left. Probable tiny bilateral pleural effusions. No evidence for pneumothorax. Bones are diffusely demineralized. Telemetry leads overlie the chest. IMPRESSION: 1. Interval improvement in basilar aeration with persistent bibasilar airspace opacities. 2. Tiny bilateral pleural effusions, decreased in the interval. 3. Underlying chronic interstitial lung disease. 4. Small bore  right chest tube without evidence for pneumothorax. Electronically Signed   By: Misty Stanley M.D.   On: 09/26/2018 18:08    Procedures Procedures (including critical care time)  Medications Ordered in ED Medications  dextrose 5 % solution (has no administration in time range)  acetaminophen (TYLENOL) solution 650 mg (has no administration in time range)  HYDROcodone-acetaminophen (NORCO/VICODIN) 5-325 MG per tablet 1 tablet (has no administration in time range)  morphine (MSIR) tablet 7.5 mg (has no administration in time range)  carvedilol (COREG) tablet 3.125 mg (has no administration in time range)  midodrine  (PROAMATINE) tablet 2.5 mg (has no administration in time range)  pantoprazole sodium (PROTONIX) 40 mg/20 mL oral suspension 40 mg (has no administration in time range)  levETIRAcetam (KEPPRA) 100 MG/ML solution 1,000 mg (has no administration in time range)  Valproate Sodium (DEPAKENE) solution 250 mg (has no administration in time range)  free water 300 mL (has no administration in time range)  feeding supplement (PRO-STAT SUGAR FREE 64) liquid 30 mL (has no administration in time range)  multivitamin liquid 15 mL (has no administration in time range)  nutrition supplement (JUVEN) (JUVEN) powder packet 1 packet (has no administration in time range)  Isosource 1.5 Cal LIQD 70 mL (has no administration in time range)  ipratropium-albuterol (DUONEB) 0.5-2.5 (3) MG/3ML nebulizer solution 3 mL (has no administration in time range)  chlorhexidine gluconate (MEDLINE KIT) (PERIDEX) 0.12 % solution 5 mL (has no administration in time range)  aspirin chewable tablet 81 mg (has no administration in time range)  enoxaparin (LOVENOX) injection 40 mg (has no administration in time range)  vancomycin (VANCOCIN) 500 mg in sodium chloride 0.9 % 100 mL IVPB (has no administration in time range)  vancomycin (VANCOCIN) 1,250 mg in sodium chloride 0.9 % 250 mL IVPB (has no administration in time range)  Ampicillin-Sulbactam (UNASYN) 3 g in sodium chloride 0.9 % 100 mL IVPB (has no administration in time range)  sodium chloride 0.9 % bolus 500 mL (0 mLs Intravenous Stopped 09/26/18 2000)  piperacillin-tazobactam (ZOSYN) IVPB 3.375 g (0 g Intravenous Stopped 09/26/18 2050)  vancomycin (VANCOCIN) IVPB 1000 mg/200 mL premix (0 mg Intravenous Stopped 09/26/18 2125)     Initial Impression / Assessment and Plan / ED Course  I have reviewed the triage vital signs and the nursing notes.  Pertinent labs & imaging results that were available during my care of the patient were reviewed by me and considered in my medical  decision making (see chart for details).       Patient is febrile in the emergency department.  Suspect healthcare associated pneumonia versus UTI.  Initiated broad-spectrum antibiotics.  Discussed with internal medicine teaching service and will see patient emergency department and admit.   Final Clinical Impressions(s) / ED Diagnoses   Final diagnoses:  Febrile illness, acute  AKI (acute kidney injury) [facility])    ED Discharge Orders    None       Julianne Rice, MD 09/26/18 2259

## 2018-09-26 NOTE — H&P (Addendum)
Date: 09/26/2018               Patient Name:  Melissa Cochran MRN: [id number]  DOB: 08/15/1935 Age / Sex: 83 y.o., female   PCP: [name], MD         Medical Service: Internal Medicine Teaching Service         Attending Physician: Dr. Sid Falcon, MD    First Contact: Dr. Truman Hayward Pager: [phone]  Second Contact: Dr. Frederico Hamman Pager: [phone]       After Hours (After 5p/  First Contact Pager: [phone]  weekends / holidays): Second Contact Pager: [phone]   Chief Complaint: Elevated BUN and creatinine  History of Present Illness: [name] is an 83 year old female with chronic hypoxic respiratory failure and ventilator/PEG tube dependence, CAD s/p stent in 3662, chronic systolic heart failure with EF of 45 to 50% by echo on 06/2017, atrial fibrillation (apixaban discontinued at last admission due to concerns for anemia), history of CVA, and seizure disorder who presents from [facility] for elevated BUN and creatinine.  The patient is nonverbal at baseline.  History was provided by chart review and speaking with her daughter Melissa Cochran.  [name] was called earlier today by her mother's nurse stating that she had "high blood levels" which may be due to "too much fluid".  Unfortunately due to the [place] pandemic she has not been able to see her mother since early March.  She states that she has had several episodes of pneumonia and was placed on a ventilator several months ago.  Her baseline is nonverbal, opening eyes intermittently to verbal and tactile stimuli.  She was recently admitted on 4/4 to 4/17 for hypoxic respiratory failure and severe sepsis secondary to bilateral pneumonia. She underwent a bronchoscopy with tracheal aspirate growing acetinobactor baumanii. She required ICU stay with pressor support. She was treated with Unasyn and completed a full course of antibiotics prior to being discharged back to [facility] home.  In the ED she was found to be febrile up  to 101.52F with otherwise unremarkable vital signs.  Labs were significant for UA positive for protein, leukocytes, and few bacteria.  COVID negative. CBC showed leukocytosis of 17.7, normocytic anemia with hemoglobin of 9.9 (baseline appears to be between 8-9).  She was also found to have hypernatremia with a sodium of 150, hyperkalemia of 5.3, acute kidney injury with creatinine of 0.79 (baseline 0.4-0.58; discharge creatinine 0.58), BUN of 109.  Hypermagnesemia of 3.4.  She was started on vancomycin and Zosyn and given 500 mils IV fluid bolus.  Meds:  No current facility-administered medications on file prior to encounter.    Current Outpatient Medications on File Prior to Encounter  Medication Sig  . acetaminophen (TYLENOL) 160 MG/5ML solution Place 20.3 mLs (650 mg total) into feeding tube every 6 (six) hours as needed for mild pain, headache or fever. (Patient not taking: Reported on 08/12/2018)  . Amino Acids-Protein Hydrolys (FEEDING SUPPLEMENT, PRO-STAT SUGAR FREE 64,) LIQD Place 30 mLs into feeding tube 2 (two) times daily.  Marland Kitchen aspirin 81 MG chewable tablet Place 1 tablet (81 mg total) into feeding tube daily. (Patient not taking: Reported on 08/12/2018)  . carvedilol (COREG) 3.125 MG tablet Place 1 tablet (3.125 mg total) into feeding tube 2 (two) times daily with a meal.  . chlorhexidine (PERIDEX) 0.12 % solution 5 mLs See admin instructions. 5 ml's for trach care 2 times a day  . collagenase (SANTYL) ointment Apply topically  daily. (Patient not taking: Reported on 08/12/2018)  . famotidine (PEPCID) 20 MG tablet Place 20 mg into feeding tube daily.   . furosemide (LASIX) 10 MG/ML injection Inject 4 mLs (40 mg total) into the vein 2 (two) times daily.  Marland Kitchen HYDROcodone-acetaminophen (NORCO/VICODIN) 5-325 MG tablet Place 1 tablet into feeding tube every 8 (eight) hours.  Marland Kitchen ipratropium-albuterol (DUONEB) 0.5-2.5 (3) MG/3ML SOLN Take 3 mLs by nebulization as needed (for acute and chronic respiratory  failure, unspecified whether with hypoxia or hypercapnia).   Marland Kitchen levETIRAcetam (KEPPRA) 100 MG/ML solution Place 10 mLs (1,000 mg total) into feeding tube 2 (two) times daily.  . midodrine (PROAMATINE) 2.5 MG tablet Take 1 tablet (2.5 mg total) by mouth 2 (two) times daily with a meal.  . morphine (MSIR) 15 MG tablet Place 0.5 tablets (7.5 mg total) into feeding tube every 6 (six) hours as needed (for dyspnea).  . Multiple Vitamin (MULTIVITAMIN) LIQD Place 15 mLs into feeding tube daily. (Patient not taking: Reported on 08/12/2018)  . Multiple Vitamin (MULTIVITAMIN) tablet Place 1 tablet into feeding tube daily.  . nutrition supplement, JUVEN, (JUVEN) PACK Place 1 packet into feeding tube 2 (two) times daily between meals.  . Nutritional Supplements (FEEDING SUPPLEMENT, VITAL AF 1.2 CAL,) LIQD Place 1,000 mLs into feeding tube continuous. (Patient not taking: Reported on 08/12/2018)  . Nutritional Supplements (ISOSOURCE 1.5 CAL) LIQD Place 70 mLs into feeding tube 2 (two) times daily.   . pantoprazole (PROTONIX) 40 MG injection Inject 40 mg into the vein every 12 (twelve) hours for 4 days.  . potassium chloride 20 MEQ/15ML (10%) SOLN Place 15 mLs (20 mEq total) into feeding tube 2 (two) times daily.  . Sodium Chloride Flush (NORMAL SALINE FLUSH IV) Inject 10 mLs into the vein See admin instructions. Use 10 ml's via IV every shift for any used lumens- flush each unused lumen  . Valproate Sodium (DEPAKENE) 250 MG/5ML SOLN solution Place 5 mLs (250 mg total) into feeding tube 2 (two) times daily.  . Water For Irrigation, Sterile (FREE WATER) SOLN Place 300 mLs into feeding tube every 4 (four) hours.    Allergies: Allergies as of 09/26/2018  . (No Known Allergies)   Past Medical History:  Diagnosis Date  . Adnexal mass   . Anxiety   . Arthritis   . Bladder tumor   . Bleeding ulcer   . CHF (congestive heart failure) ([place])   . CHF (congestive heart failure) ([place])   . Chronic bronchitis ([place])   .  Chronic respiratory failure ([place]) 04/19/2018  . COPD (chronic obstructive pulmonary disease) ([place])   . Coronary artery disease   . CVA (cerebral vascular accident) ([place]) 04/19/2018  . DVT (deep venous thrombosis) ([facility])    RLE  . GERD (gastroesophageal reflux disease)   . Heart block    left  . History of bleeding ulcers   . History of kidney stones   . Hyperlipemia   . Hypertension   . Myocardial infarction ([place])    "slight one" (07/05/2017)  . Pneumonia    "several times" (07/05/2017)  . Seizures ([place]) 02/2017 "several"; 06/26/2017 X 1  . Shortness of breath dyspnea   . Stroke [facility]) 02/13/2018   "still right sided weakness" (07/05/2017)    Family History:  Family History  Problem Relation Age of Onset  . Stomach cancer Mother   . CVA Father   . Bladder Cancer Neg Hx   . Kidney cancer Neg Hx     Social History:  She lives in [place]. She has one daughter Winta Barcelo who is her healthcare power of attorney.   Review of Systems: A complete ROS was negative except as per HPI.   Physical Exam: Blood pressure 101/61, pulse (!) 103, temperature (!) 101.1 F (38.4 C), temperature source Rectal, resp. rate 16, height 5\' 7"  (1.702 m), SpO2 95 %. General: Lying in bed in no acute distress, nonverbal--does not open eyes to verbal or tactile stimuli Neuro: Not alert nor oriented.  Nonverbal--does not open eyes to verbal or tactile stimuli HEENT: Tracheostomy in place, no discharge or erythema around tracheostomy tube insertion site.  She does resist when doing her eyelids.  PERRL. Cardiovascular: Normal rate, regular rhythm.  Chest tube in place at right side of chest. Respiratory: Auscultated lungs anteriorly.  Coarse breath sounds.  Poor air movement at the lung bases bilaterally. Abdomen: PEG tube in place at the epigastric area of the abdomen.  There does appear to be purulent discharge at the site of insertion.  No tenderness to palpation of the abdomen. MSK: No lower  extremity edema, erythema, or warmth bilaterally. Skin: Multiple skin pressure ulcers of varying stages.  No purulent fluid noted at any of the ulcerations. Upper Back:   Sacrum:   Right Foot:   Left Foot:   Left Foot:    EKG: personally reviewed my interpretation is sinus tachycardia with heart rate of 102.  Irregular rhythm with varying R to R intervals.  CXR: personally reviewed my interpretation is persistent bibasilar airspace opacities which have improved significantly since last admission (compared to CXR 08/23/2018).  Lateral pleural effusions again decreased since last CXR.  Tracheostomy tube in place.  Right chest tube in place.  Assessment & Plan by Problem: Principal Problem:   Sepsis ([place]) Active Problems:   Pressure ulcer   Chronic respiratory failure ([facility])   Hypernatremia   AKI (acute kidney injury) ([place])  [name] is an 83 y/o female with chronic hypoxic respiratory failure and ventilator/PEG tube dependence, chronic systolic heart failure (EF of 45 -50%), and atrial fibrillation who presents with signs of sepsis (fever, leukocytosis, and organ dysfunction with AKI). Source of infection likely pneumonia vs pressure ulcers vs UTI vs peg tube.  Sepsis 2/2 pressure ulcers vs UTI vs pneumonia vs peg tube  - Received Vanco and Zosyn in the ED. Per respiratory cx, previous pneumonia grew Aceinetobacter susceptible to Unasyn only. Will plan on keeping Vanco and switching to Unasyn. -Blood and urine cultures pending - IVFs (D5 100 mL/hr) - Continue home Midodrine 2.5 mg BID (started during last admission for persistent hypotension)  Hypernatremia: Has improved from discharge 156->150. Free water deficit today is 1.2 L. - D5W as above - Spaced out PEG tube flushes from q4h to q8h to avoid fluid overload as she is also getting IVFs  AKI and Hyperkalemia: Likely pre-renal in nature from not getting enough fluid from PEG flushes alone.  - IVFs - Stop K supplements   Elevated BUN: Likely multifactorial in nature. Dehydration, elevated protein diet, and AKI. - Continue to monitor as we treat with IVFs.  Chronic hypoxic respiratory failure: Ventilator dependent. Stable.  - Admit to ICU - Tele - Continue previous vent settings - Duonebs PRN  Pressure Ulcers: - Wound culture  Normocytic Anemia: Received multiple blood transfusions during last admission. It was thought that her anemia was likely due to anemia of chronic disease, fluid overload secondary to sepsis, and free water administration. Hb stable at 9.9. - Continue  to monitor  Atrial fibrillation: heart rate within acceptable limits. Apixaban discontinued during last admission due to concerns of GI bleeding with FOBT positive stool. It was not restarted at discharge.  - Continue to monitor.   Chronic systolic heart failure (EF of 45 -50%): Appears euvolemic on exam.  - Continue home carvedilol 3.125 mg BID  History of CVA: - Continue aspirin 81 mg daily  Seizure disorder: - Continue home keppra 1000 mg BID and Depakene 250 mg BID  Goals of Care: Palliative care consulted during last admission. Her daughter Theadora Noyes who would "like everyone done" including remaining full code. I confirmed this with Ms. Katheran James tonight.   FEN/GI: Feeding supplements and dietician consult. DVT PPX: Lovenox Code status: Full   Dispo: Admit patient to Inpatient with expected length of stay greater than 2 midnights.  Signed: Carroll Sage, MD 09/26/2018, 8:45 PM  Pager: [phone]

## 2018-09-26 NOTE — ED Notes (Signed)
ED TO INPATIENT HANDOFF REPORT  ED Nurse Name and Phone #: [phone] Lucita Ferrara Name/Age/Gender Melissa Cochran 83 y.o. female Room/Bed: 024C/024C  Code Status   Code Status: Full Code  Home/SNF/Other Skilled nursing facility  Triage Complete: Triage complete  Chief Complaint from [facility]  Triage Note Pt sent to ER for treatment of elevated BUN and creat.   No other changes in patients baseline reported by staff.  Pt non-verbal and non-responsive to speech    Allergies No Known Allergies  Level of Care/Admitting Diagnosis ED Disposition    ED Disposition Condition [facility] [facility]: [place] [100100]  Level of Care: ICU [6]  Covid Evaluation: Screening Protocol (No Symptoms)  Diagnosis: Sepsis [facility]) [[id number]]  Admitting Physician: Cresenciano Lick  Attending Physician: Cresenciano Lick  Estimated length of stay: 3 - 4 days  Certification:: I certify this patient will need inpatient services for at least 2 midnights  PT Class (Do Not Modify): Inpatient [101]  PT Acc Code (Do Not Modify): Private [1]       B Medical/Surgery History Past Medical History:  Diagnosis Date  . Adnexal mass   . Anxiety   . Arthritis   . Bladder tumor   . Bleeding ulcer   . CHF (congestive heart failure) ([place])   . CHF (congestive heart failure) ([place])   . Chronic bronchitis ([place])   . Chronic respiratory failure ([place]) 04/19/2018  . COPD (chronic obstructive pulmonary disease) ([place])   . Coronary artery disease   . CVA (cerebral vascular accident) ([place]) 04/19/2018  . DVT (deep venous thrombosis) ([facility])    RLE  . GERD (gastroesophageal reflux disease)   . Heart block    left  . History of bleeding ulcers   . History of kidney stones   . Hyperlipemia   . Hypertension   . Myocardial infarction ([place])    "slight one" (07/05/2017)  . Pneumonia    "several times" (07/05/2017)  . Seizures ([place]) 02/2017 "several"; 06/26/2017 X 1  .  Shortness of breath dyspnea   . Stroke [facility]) 02/13/2018   "still right sided weakness" (07/05/2017)   Past Surgical History:  Procedure Laterality Date  . ABDOMINAL HYSTERECTOMY    . CARDIAC CATHETERIZATION Left 10/21/2015   Procedure: Left Heart Cath and Coronary Angiography;  Surgeon: Isaias Cowman, MD;  Location: [place] [facility];  Service: Cardiovascular;  Laterality: Left;  . CARDIAC CATHETERIZATION N/A 10/21/2015   Procedure: Coronary Stent Intervention;  Surgeon: Isaias Cowman, MD;  Location: [place] [facility];  Service: Cardiovascular;  Laterality: N/A;  . CARDIAC CATHETERIZATION    . CYSTOSCOPY W/ RETROGRADES Bilateral 01/20/2016   Procedure: CYSTOSCOPY WITH RETROGRADE PYELOGRAM;  Surgeon: Hollice Espy, MD;  Location: [facility] ORS;  Service: Urology;  Laterality: Bilateral;  . CYSTOSCOPY WITH STENT PLACEMENT Right 01/20/2016   Procedure: CYSTOSCOPY WITH STENT PLACEMENT;  Surgeon: Hollice Espy, MD;  Location: [facility] OR[facility];  Service: Urology;  Laterality: Right;  . HIP FRACTURE SURGERY Left   . STOMACH SURGERY     "stomach ulcers"  . TRACHEOSTOMY TUBE PLACEMENT N/A 03/02/2017   Procedure: TRACHEOSTOMY;  Surgeon: Rozetta Nunnery, MD;  Location: [place];  Service: ENT;  Laterality: N/A;  . TRANSURETHRAL RESECTION OF BLADDER TUMOR N/A 01/20/2016   Procedure: TRANSURETHRAL RESECTION OF BLADDER TUMOR (TURBT);  Surgeon: Hollice Espy, MD;  Location: [facility] OR[facility];  Service: Urology;  Laterality: N/A;     A IV Location/Drains/Wounds Patient Lines/Drains/Airways  Status   Active Line/Drains/Airways    Name:   Placement date:   Placement time:   Site:   Days:   PICC Double Lumen 68/34/19 PICC Left Cephalic 44 cm 0 cm   62/22/97    1751     40   Gastrostomy/Enterostomy Gastrostomy LUQ   04/25/18    0800    LUQ   154   Rectal Tube/Pouch   08/21/18    1456    -   36   Urethral Catheter Inserted by nightshift RN [place]  Straight-tip 14 Fr.   08/13/18    0600    Straight-tip   44    External Urinary Catheter   06/18/18    0230    -   100   Tracheostomy [place] 8 mm Cuffed   08/12/18    1831    8 mm   45   Tracheostomy Portex 8 mm Cuffed   -    -    8 mm      Pressure Injury 04/18/18 Stage IV - Full thickness tissue loss with exposed bone, tendon or muscle.   04/18/18    1814     161   Pressure Injury 04/18/18 Unstageable - Full thickness tissue loss in which the base of the ulcer is covered by slough (yellow, tan, gray, green or brown) and/or eschar (tan, brown or black) in the wound bed. Has tunneling at 78 o clock, yellow slough aro   04/18/18    1815     161   Pressure Injury 06/10/18 Stage IV - Full thickness tissue loss with exposed bone, tendon or muscle. bone palpable    06/10/18    1700     108   Pressure Injury 08/13/18 Unstageable - Full thickness tissue loss in which the base of the ulcer is covered by slough (yellow, tan, gray, green or brown) and/or eschar (tan, brown or black) in the wound bed. large black eschar covering left heel   08/13/18    0710     44   Pressure Injury 08/13/18 Unstageable - Full thickness tissue loss in which the base of the ulcer is covered by slough (yellow, tan, gray, green or brown) and/or eschar (tan, brown or black) in the wound bed. eschar to right heel   08/13/18    0710     44   Pressure Injury 08/13/18 Deep Tissue Injury - Purple or maroon localized area of discolored intact skin or blood-filled blister due to damage of underlying soft tissue from pressure and/or shear. long deep tissue injury along left lateral lower leg    08/13/18    0800     44   Pressure Injury 08/14/18 Deep Tissue Injury - Purple or maroon localized area of discolored intact skin or blood-filled blister due to damage of underlying soft tissue from pressure and/or shear.   08/14/18    -     43   Pressure Injury 08/23/18 Stage II -  Partial thickness loss of dermis presenting as a shallow open ulcer with a red, pink wound bed without slough.   08/23/18    0016      34   Wound / Incision (Open or Dehisced) 06/26/17 Non-pressure wound Ankle Left;Lateral   06/26/17    0115    Ankle   457          Intake/Output Last 24 hours No intake or output data in the 24 hours ending 09/26/18 2144  Labs/Imaging Results for orders placed or performed during the hospital encounter of 09/26/18 (from the past 48 hour(s))  CBC with Differential/Platelet     Status: Abnormal   Collection Time: 09/26/18  5:49 PM  Result Value Ref Range   WBC 17.7 (H) 4.0 - 10.5 K/uL   RBC 3.80 (L) 3.87 - 5.11 MIL/uL   Hemoglobin 9.9 (L) 12.0 - 15.0 g/dL   HCT 36.7 36.0 - 46.0 %   MCV 96.6 80.0 - 100.0 fL   MCH 26.1 26.0 - 34.0 pg   MCHC 27.0 (L) 30.0 - 36.0 g/dL   RDW 19.9 (H) 11.5 - 15.5 %   Platelets 245 150 - 400 K/uL   nRBC 0.1 0.0 - 0.2 %   Neutrophils Relative % 84 %   Neutro Abs 15.0 (H) 1.7 - 7.7 K/uL   Lymphocytes Relative 10 %   Lymphs Abs 1.8 0.7 - 4.0 K/uL   Monocytes Relative 4 %   Monocytes Absolute 0.7 0.1 - 1.0 K/uL   Eosinophils Relative 0 %   Eosinophils Absolute 0.1 0.0 - 0.5 K/uL   Basophils Relative 1 %   Basophils Absolute 0.1 0.0 - 0.1 K/uL   Immature Granulocytes 1 %   Abs Immature Granulocytes 0.11 (H) 0.00 - 0.07 K/uL    Comment: Performed at [facility], [address] [place], [place] [address]  Comprehensive metabolic panel     Status: Abnormal   Collection Time: 09/26/18  5:49 PM  Result Value Ref Range   Sodium 150 (H) 135 - 145 mmol/L   Potassium 5.3 (H) 3.5 - 5.1 mmol/L   Chloride 112 (H) 98 - 111 mmol/L   CO2 29 22 - 32 mmol/L   Glucose, Bld 137 (H) 70 - 99 mg/dL   BUN 109 (H) 8 - 23 mg/dL   Creatinine, Ser 0.79 0.44 - 1.00 mg/dL   Calcium 10.0 8.9 - 10.3 mg/dL   Total Protein 7.1 6.5 - 8.1 g/dL   Albumin 1.8 (L) 3.5 - 5.0 g/dL   AST 35 15 - 41 U/L   ALT 28 0 - 44 U/L   Alkaline Phosphatase 80 38 - 126 U/L   Total Bilirubin 0.7 0.3 - 1.2 mg/dL   GFR calc non Af Amer >60 >60 mL/min   GFR calc Af Amer >60 >60 mL/min    Anion gap 9 5 - 15    Comment: Performed at [facility], [place] [address] [place], [place] [address]  Magnesium     Status: Abnormal   Collection Time: 09/26/18  5:49 PM  Result Value Ref Range   Magnesium 3.4 (H) 1.7 - 2.4 mg/dL    Comment: Performed at [place] [address] [place], [place] [address]  Lactic acid, plasma     Status: None   Collection Time: 09/26/18  5:51 PM  Result Value Ref Range   Lactic Acid, Venous 1.7 0.5 - 1.9 mmol/L    Comment: Performed at [facility] [address] [place], [place] [address]  I-STAT 7, (LYTES, BLD GAS, ICA, H+H)     Status: Abnormal   Collection Time: 09/26/18  5:58 PM  Result Value Ref Range   pH, Arterial 7.470 (H) 7.350 - 7.450   pCO2 arterial 44.2 32.0 - 48.0 mmHg   pO2, Arterial 65.0 (L) 83.0 - 108.0 mmHg   Bicarbonate 32.0 (H) 20.0 - 28.0 mmol/L   TCO2 33 (H) 22 - 32 mmol/L  O2 Saturation 93.0 %   Acid-Base Excess 8.0 (H) 0.0 - 2.0 mmol/L   Sodium 151 (H) 135 - 145 mmol/L   Potassium 5.2 (H) 3.5 - 5.1 mmol/L   Calcium, Ion 1.40 1.15 - 1.40 mmol/L   HCT 29.0 (L) 36.0 - 46.0 %   Hemoglobin 9.9 (L) 12.0 - 15.0 g/dL   Patient temperature 99.9 F    Collection site RADIAL, ALLEN'S TEST ACCEPTABLE    Drawn by RT    Sample type ARTERIAL   SARS Coronavirus 2 (CEPHEID - Performed in [place] hospital lab), Hosp Order     Status: None   Collection Time: 09/26/18  6:30 PM  Result Value Ref Range   SARS Coronavirus 2 NEGATIVE NEGATIVE    Comment: (NOTE) If result is NEGATIVE SARS-CoV-2 target nucleic acids are NOT DETECTED. The SARS-CoV-2 RNA is generally detectable in upper and lower  respiratory specimens during the acute phase of infection. The lowest  concentration of SARS-CoV-2 viral copies this assay can detect is 250  copies / mL. A negative result does not preclude SARS-CoV-2 infection  and should not be used as the sole basis for treatment or other  patient management decisions.  A negative  result may occur with  improper specimen collection / handling, submission of specimen other  than nasopharyngeal swab, presence of viral mutation(s) within the  areas targeted by this assay, and inadequate number of viral copies  (<250 copies / mL). A negative result must be combined with clinical  observations, patient history, and epidemiological information. If result is POSITIVE SARS-CoV-2 target nucleic acids are DETECTED. The SARS-CoV-2 RNA is generally detectable in upper and lower  respiratory specimens dur ing the acute phase of infection.  Positive  results are indicative of active infection with SARS-CoV-2.  Clinical  correlation with patient history and other diagnostic information is  necessary to determine patient infection status.  Positive results do  not rule out bacterial infection or co-infection with other viruses. If result is PRESUMPTIVE POSTIVE SARS-CoV-2 nucleic acids MAY BE PRESENT.   A presumptive positive result was obtained on the submitted specimen  and confirmed on repeat testing.  While 2019 novel coronavirus  (SARS-CoV-2) nucleic acids may be present in the submitted sample  additional confirmatory testing may be necessary for epidemiological  and / or clinical management purposes  to differentiate between  SARS-CoV-2 and other Sarbecovirus currently known to infect humans.  If clinically indicated additional testing with an alternate test  methodology [phone]) is advised. The SARS-CoV-2 RNA is generally  detectable in upper and lower respiratory sp ecimens during the acute  phase of infection. The expected result is Negative. Fact Sheet for Patients:  StrictlyIdeas.no Fact Sheet for Healthcare Providers: BankingDealers.co.za This test is not yet approved or cleared by the [place] FDA and has been authorized for detection and/or diagnosis of SARS-CoV-2 by FDA under an Emergency Use Authorization  (EUA).  This EUA will remain in effect (meaning this test can be used) for the duration of the COVID-19 declaration under Section 564(b)(1) of the Act, 21 U.S.C. section 360bbb-3(b)(1), unless the authorization is terminated or revoked sooner. Performed at [facility], [place] [address], [place],  [address]   Urinalysis, Routine w reflex microscopic     Status: Abnormal   Collection Time: 09/26/18  7:15 PM  Result Value Ref Range   Color, Urine YELLOW YELLOW   APPearance CLOUDY (A) CLEAR   Specific Gravity, Urine 1.018 1.005 - 1.030  pH 9.0 (H) 5.0 - 8.0   Glucose, UA NEGATIVE NEGATIVE mg/dL   Hgb urine dipstick NEGATIVE NEGATIVE   Bilirubin Urine NEGATIVE NEGATIVE   Ketones, ur NEGATIVE NEGATIVE mg/dL   Protein, ur 100 (A) NEGATIVE mg/dL   Nitrite NEGATIVE NEGATIVE   Leukocytes,Ua LARGE (A) NEGATIVE   RBC / HPF 0-5 0 - 5 RBC/hpf   WBC, UA 6-10 0 - 5 WBC/hpf   Bacteria, UA FEW (A) NONE SEEN   Squamous Epithelial / LPF 0-5 0 - 5   Mucus PRESENT    Triple Phosphate Crystal PRESENT     Comment: Performed at [facility], [place] [address] [place], [place] [address]   Dg Chest Port 1 View  Result Date: 09/26/2018 CLINICAL DATA:  Elevated BUN and creatinine. EXAM: PORTABLE CHEST 1 VIEW COMPARISON:  08/23/2018 FINDINGS: Tracheostomy tube noted. Right chest tube visualized in situ. Left PICC line tip overlies the distal SVC. Interstitial markings are diffusely coarsened with chronic features. Patchy bibasilar airspace disease noted, right greater than left. Probable tiny bilateral pleural effusions. No evidence for pneumothorax. Bones are diffusely demineralized. Telemetry leads overlie the chest. IMPRESSION: 1. Interval improvement in basilar aeration with persistent bibasilar airspace opacities. 2. Tiny bilateral pleural effusions, decreased in the interval. 3. Underlying chronic interstitial lung disease. 4. Small bore right chest tube without evidence for pneumothorax.  Electronically Signed   By: Misty Stanley M.D.   On: 09/26/2018 18:08    Pending Labs Unresulted Labs (From admission, onward)    Start     Ordered   09/27/18 9563  Basic metabolic panel  Tomorrow morning,   R     09/26/18 2136   09/27/18 0500  CBC  Tomorrow morning,   R     09/26/18 2136   09/27/18 0500  Magnesium  Tomorrow morning,   R     09/26/18 2136   09/26/18 2001  Urine culture  ONCE - STAT,   STAT     09/26/18 2002   09/26/18 1745  Blood gas, arterial  Once,   R    Question:  Room air or oxygen  Answer:  Oxygen   09/26/18 1745   09/26/18 1719  Culture, blood (Routine X 2) w Reflex to ID Panel  BLOOD CULTURE X 2,   STAT     09/26/18 1719          Vitals/Pain Today's Vitals   09/26/18 2000 09/26/18 2100 09/26/18 2115 09/26/18 2130  BP: 101/61 (!) 92/49 (!) 105/55 112/64  Pulse: (!) 103 90 97 93  Resp: 16 18 (!) 22 (!) 21  Temp:      TempSrc:      SpO2: 95% 95% 96% 96%  Weight:   77.1 kg   Height:   5\' 7"  (1.702 m)     Isolation Precautions No active isolations  Medications Medications  dextrose 5 % solution (has no administration in time range)  acetaminophen (TYLENOL) solution 650 mg (has no administration in time range)  HYDROcodone-acetaminophen (NORCO/VICODIN) 5-325 MG per tablet 1 tablet (has no administration in time range)  morphine (MSIR) tablet 7.5 mg (has no administration in time range)  carvedilol (COREG) tablet 3.125 mg (has no administration in time range)  midodrine (PROAMATINE) tablet 2.5 mg (has no administration in time range)  pantoprazole sodium (PROTONIX) 40 mg/20 mL oral suspension 40 mg (has no administration in time range)  levETIRAcetam (KEPPRA) 100 MG/ML solution 1,000 mg (has no administration in time range)  Valproate  Sodium (DEPAKENE) solution 250 mg (has no administration in time range)  free water 300 mL (has no administration in time range)  feeding supplement (PRO-STAT SUGAR FREE 64) liquid 30 mL (has no administration in  time range)  multivitamin liquid 15 mL (has no administration in time range)  nutrition supplement (JUVEN) (JUVEN) powder packet 1 packet (has no administration in time range)  Isosource 1.5 Cal LIQD 70 mL (has no administration in time range)  ipratropium-albuterol (DUONEB) 0.5-2.5 (3) MG/3ML nebulizer solution 3 mL (has no administration in time range)  chlorhexidine (PERIDEX) 0.12 % solution 5 mL (has no administration in time range)  aspirin chewable tablet 81 mg (has no administration in time range)  enoxaparin (LOVENOX) injection 40 mg (has no administration in time range)  vancomycin (VANCOCIN) 500 mg in sodium chloride 0.9 % 100 mL IVPB (has no administration in time range)  vancomycin (VANCOCIN) 1,250 mg in sodium chloride 0.9 % 250 mL IVPB (has no administration in time range)  Ampicillin-Sulbactam (UNASYN) 3 g in sodium chloride 0.9 % 100 mL IVPB (has no administration in time range)  sodium chloride 0.9 % bolus 500 mL (0 mLs Intravenous Stopped 09/26/18 2000)  piperacillin-tazobactam (ZOSYN) IVPB 3.375 g (0 g Intravenous Stopped 09/26/18 2050)  vancomycin (VANCOCIN) IVPB 1000 mg/200 mL premix (1,000 mg Intravenous New Bag/Given 09/26/18 2025)    Mobility non-ambulatory     Focused Assessments Pulmonary Assessment Handoff:  Lung sounds: Bilateral Breath Sounds: Rhonchi, Diminished O2 Device: Ventilator        R Recommendations: See Admitting Provider Note  Report given to:   Additional Notes: trach pt, on vent

## 2018-09-27 ENCOUNTER — Inpatient Hospital Stay (HOSPITAL_COMMUNITY): Payer: Medicare Other

## 2018-09-27 DIAGNOSIS — J151 Pneumonia due to Pseudomonas: Secondary | ICD-10-CM | POA: Diagnosis not present

## 2018-09-27 DIAGNOSIS — E870 Hyperosmolality and hypernatremia: Secondary | ICD-10-CM

## 2018-09-27 DIAGNOSIS — Z79899 Other long term (current) drug therapy: Secondary | ICD-10-CM

## 2018-09-27 DIAGNOSIS — L89159 Pressure ulcer of sacral region, unspecified stage: Secondary | ICD-10-CM

## 2018-09-27 DIAGNOSIS — D320 Benign neoplasm of cerebral meninges: Secondary | ICD-10-CM | POA: Insufficient documentation

## 2018-09-27 DIAGNOSIS — Z7982 Long term (current) use of aspirin: Secondary | ICD-10-CM

## 2018-09-27 DIAGNOSIS — R6521 Severe sepsis with septic shock: Secondary | ICD-10-CM | POA: Diagnosis not present

## 2018-09-27 DIAGNOSIS — Z9911 Dependence on respirator [ventilator] status: Secondary | ICD-10-CM

## 2018-09-27 DIAGNOSIS — J9611 Chronic respiratory failure with hypoxia: Secondary | ICD-10-CM

## 2018-09-27 DIAGNOSIS — J438 Other emphysema: Secondary | ICD-10-CM | POA: Insufficient documentation

## 2018-09-27 DIAGNOSIS — L89619 Pressure ulcer of right heel, unspecified stage: Secondary | ICD-10-CM | POA: Diagnosis not present

## 2018-09-27 DIAGNOSIS — I5022 Chronic systolic (congestive) heart failure: Secondary | ICD-10-CM

## 2018-09-27 DIAGNOSIS — Z87440 Personal history of urinary (tract) infections: Secondary | ICD-10-CM

## 2018-09-27 DIAGNOSIS — A419 Sepsis, unspecified organism: Secondary | ICD-10-CM | POA: Diagnosis not present

## 2018-09-27 DIAGNOSIS — L89629 Pressure ulcer of left heel, unspecified stage: Secondary | ICD-10-CM

## 2018-09-27 DIAGNOSIS — G40909 Epilepsy, unspecified, not intractable, without status epilepticus: Secondary | ICD-10-CM

## 2018-09-27 DIAGNOSIS — Z8701 Personal history of pneumonia (recurrent): Secondary | ICD-10-CM

## 2018-09-27 DIAGNOSIS — D649 Anemia, unspecified: Secondary | ICD-10-CM | POA: Diagnosis not present

## 2018-09-27 DIAGNOSIS — I110 Hypertensive heart disease with heart failure: Secondary | ICD-10-CM

## 2018-09-27 DIAGNOSIS — L89109 Pressure ulcer of unspecified part of back, unspecified stage: Secondary | ICD-10-CM | POA: Diagnosis not present

## 2018-09-27 DIAGNOSIS — E875 Hyperkalemia: Secondary | ICD-10-CM | POA: Diagnosis not present

## 2018-09-27 DIAGNOSIS — I2510 Atherosclerotic heart disease of native coronary artery without angina pectoris: Secondary | ICD-10-CM

## 2018-09-27 DIAGNOSIS — J9621 Acute and chronic respiratory failure with hypoxia: Secondary | ICD-10-CM | POA: Diagnosis not present

## 2018-09-27 DIAGNOSIS — Z931 Gastrostomy status: Secondary | ICD-10-CM

## 2018-09-27 DIAGNOSIS — R509 Fever, unspecified: Secondary | ICD-10-CM

## 2018-09-27 DIAGNOSIS — Z8619 Personal history of other infectious and parasitic diseases: Secondary | ICD-10-CM

## 2018-09-27 DIAGNOSIS — Z978 Presence of other specified devices: Secondary | ICD-10-CM

## 2018-09-27 DIAGNOSIS — Z87820 Personal history of traumatic brain injury: Secondary | ICD-10-CM

## 2018-09-27 DIAGNOSIS — J9610 Chronic respiratory failure, unspecified whether with hypoxia or hypercapnia: Secondary | ICD-10-CM | POA: Diagnosis not present

## 2018-09-27 DIAGNOSIS — N200 Calculus of kidney: Secondary | ICD-10-CM | POA: Diagnosis not present

## 2018-09-27 DIAGNOSIS — Z8673 Personal history of transient ischemic attack (TIA), and cerebral infarction without residual deficits: Secondary | ICD-10-CM

## 2018-09-27 DIAGNOSIS — Z95828 Presence of other vascular implants and grafts: Secondary | ICD-10-CM

## 2018-09-27 DIAGNOSIS — R918 Other nonspecific abnormal finding of lung field: Secondary | ICD-10-CM | POA: Diagnosis not present

## 2018-09-27 DIAGNOSIS — I252 Old myocardial infarction: Secondary | ICD-10-CM

## 2018-09-27 DIAGNOSIS — N179 Acute kidney failure, unspecified: Secondary | ICD-10-CM | POA: Diagnosis not present

## 2018-09-27 DIAGNOSIS — Z955 Presence of coronary angioplasty implant and graft: Secondary | ICD-10-CM

## 2018-09-27 DIAGNOSIS — I4891 Unspecified atrial fibrillation: Secondary | ICD-10-CM

## 2018-09-27 DIAGNOSIS — F0390 Unspecified dementia without behavioral disturbance: Secondary | ICD-10-CM

## 2018-09-27 DIAGNOSIS — Z20828 Contact with and (suspected) exposure to other viral communicable diseases: Secondary | ICD-10-CM | POA: Diagnosis not present

## 2018-09-27 LAB — CBC
HCT: 29 % — ABNORMAL LOW (ref 36.0–46.0)
HCT: 30.4 % — ABNORMAL LOW (ref 36.0–46.0)
Hemoglobin: 7.7 g/dL — ABNORMAL LOW (ref 12.0–15.0)
Hemoglobin: 8.2 g/dL — ABNORMAL LOW (ref 12.0–15.0)
MCH: 26.2 pg (ref 26.0–34.0)
MCH: 26.5 pg (ref 26.0–34.0)
MCHC: 26.6 g/dL — ABNORMAL LOW (ref 30.0–36.0)
MCHC: 27 g/dL — ABNORMAL LOW (ref 30.0–36.0)
MCV: 98.6 fL (ref 80.0–100.0)
MCV: 98.4 fL (ref 80.0–100.0)
Platelets: 210 10*3/uL (ref 150–400)
Platelets: 261 10*3/uL (ref 150–400)
RBC: 2.94 MIL/uL — ABNORMAL LOW (ref 3.87–5.11)
RBC: 3.09 MIL/uL — ABNORMAL LOW (ref 3.87–5.11)
RDW: 19.9 % — ABNORMAL HIGH (ref 11.5–15.5)
RDW: 19.7 % — ABNORMAL HIGH (ref 11.5–15.5)
WBC: 12.7 10*3/uL — ABNORMAL HIGH (ref 4.0–10.5)
WBC: 15.7 10*3/uL — ABNORMAL HIGH (ref 4.0–10.5)
nRBC: 0.2 % (ref 0.0–0.2)
nRBC: 0.3 % — ABNORMAL HIGH (ref 0.0–0.2)

## 2018-09-27 LAB — CORTISOL: Cortisol, Plasma: 12.7 ug/dL

## 2018-09-27 LAB — GLUCOSE, CAPILLARY
Glucose-Capillary: 77 mg/dL (ref 70–99)
Glucose-Capillary: 233 mg/dL — ABNORMAL HIGH (ref 70–99)
Glucose-Capillary: 107 mg/dL — ABNORMAL HIGH (ref 70–99)
Glucose-Capillary: 119 mg/dL — ABNORMAL HIGH (ref 70–99)
Glucose-Capillary: 146 mg/dL — ABNORMAL HIGH (ref 70–99)
Glucose-Capillary: 295 mg/dL — ABNORMAL HIGH (ref 70–99)
Glucose-Capillary: 249 mg/dL — ABNORMAL HIGH (ref 70–99)

## 2018-09-27 LAB — BASIC METABOLIC PANEL
Anion gap: 8 (ref 5–15)
Anion gap: 8 (ref 5–15)
BUN: 82 mg/dL — ABNORMAL HIGH (ref 8–23)
BUN: 79 mg/dL — ABNORMAL HIGH (ref 8–23)
CO2: 25 mmol/L (ref 22–32)
CO2: 24 mmol/L (ref 22–32)
Calcium: 8.1 mg/dL — ABNORMAL LOW (ref 8.9–10.3)
Calcium: 8.5 mg/dL — ABNORMAL LOW (ref 8.9–10.3)
Chloride: 120 mmol/L — ABNORMAL HIGH (ref 98–111)
Chloride: 116 mmol/L — ABNORMAL HIGH (ref 98–111)
Creatinine, Ser: 0.7 mg/dL (ref 0.44–1.00)
Creatinine, Ser: 0.62 mg/dL (ref 0.44–1.00)
GFR calc Af Amer: >60 mL/min (ref >60)
GFR calc Af Amer: >60 mL/min (ref >60)
GFR calc non Af Amer: >60 mL/min (ref >60)
GFR calc non Af Amer: >60 mL/min (ref >60)
Glucose, Bld: 106 mg/dL — ABNORMAL HIGH (ref 70–99)
Glucose, Bld: 154 mg/dL — ABNORMAL HIGH (ref 70–99)
Potassium: 4 mmol/L (ref 3.5–5.1)
Potassium: 3.5 mmol/L (ref 3.5–5.1)
Sodium: 153 mmol/L — ABNORMAL HIGH (ref 135–145)
Sodium: 148 mmol/L — ABNORMAL HIGH (ref 135–145)

## 2018-09-27 LAB — TSH: TSH: 5.653 u[IU]/mL — ABNORMAL HIGH (ref 0.350–4.500)

## 2018-09-27 LAB — MRSA PCR SCREENING: MRSA by PCR: NEGATIVE

## 2018-09-27 LAB — T4, FREE: Free T4: 0.8 ng/dL — ABNORMAL LOW (ref 0.82–1.77)

## 2018-09-27 LAB — MAGNESIUM: Magnesium: 2.5 mg/dL — ABNORMAL HIGH (ref 1.7–2.4)

## 2018-09-27 MED ORDER — SODIUM CHLORIDE 0.9% FLUSH
10.0000 mL | Freq: Two times a day (BID) | INTRAVENOUS | Status: DC
  Administered 2018-09-27 – 2018-10-05 (×14): 10 mL

## 2018-09-27 MED ORDER — POTASSIUM CHLORIDE 10 MEQ/100ML IV SOLN
10.0000 meq | INTRAVENOUS | Status: AC
  Administered 2018-09-27 (×4): 10 meq via INTRAVENOUS
  Filled 2018-09-27 (×4): qty 100

## 2018-09-27 MED ORDER — CHLORHEXIDINE GLUCONATE 0.12% ORAL RINSE (MEDLINE KIT)
15.0000 mL | Freq: Two times a day (BID) | OROMUCOSAL | Status: DC
  Administered 2018-09-27 – 2018-10-05 (×18): 15 mL via OROMUCOSAL

## 2018-09-27 MED ORDER — LORAZEPAM 2 MG/ML IJ SOLN
INTRAMUSCULAR | Status: AC
  Administered 2018-09-27: 17:00:00 4 mg via INTRAVENOUS
  Filled 2018-09-27: qty 2

## 2018-09-27 MED ORDER — OSMOLITE 1.5 CAL PO LIQD
1000.0000 mL | ORAL | Status: DC
  Administered 2018-09-27 – 2018-10-05 (×8): 1000 mL
  Filled 2018-09-27 (×10): qty 1000

## 2018-09-27 MED ORDER — PHENYLEPHRINE HCL-NACL 10-0.9 MG/250ML-% IV SOLN
25.0000 ug/min | INTRAVENOUS | Status: DC
  Administered 2018-09-27: 25 ug/min via INTRAVENOUS
  Filled 2018-09-27: qty 250

## 2018-09-27 MED ORDER — LORAZEPAM 2 MG/ML IJ SOLN
4.0000 mg | INTRAMUSCULAR | Status: AC | PRN
  Administered 2018-09-27 (×2): 4 mg via INTRAVENOUS

## 2018-09-27 MED ORDER — CHLORHEXIDINE GLUCONATE CLOTH 2 % EX PADS: 6.0000 | MEDICATED_PAD | Freq: Every day | CUTANEOUS | Status: DC

## 2018-09-27 MED ORDER — PHENYTOIN SODIUM 50 MG/ML IJ SOLN
100.0000 mg | Freq: Three times a day (TID) | INTRAMUSCULAR | Status: DC
  Administered 2018-09-28 – 2018-10-01 (×12): 100 mg via INTRAVENOUS
  Filled 2018-09-27 (×12): qty 2

## 2018-09-27 MED ORDER — ORAL CARE MOUTH RINSE
15.0000 mL | OROMUCOSAL | Status: DC
  Administered 2018-09-27 – 2018-10-05 (×84): 15 mL via OROMUCOSAL

## 2018-09-27 MED ORDER — SODIUM CHLORIDE 0.9 % IV BOLUS
1000.0000 mL | Freq: Once | INTRAVENOUS | Status: AC
  Administered 2018-09-27: 02:00:00 1000 mL via INTRAVENOUS

## 2018-09-27 MED ORDER — MIDAZOLAM 50MG/50ML (1MG/ML) PREMIX INFUSION
2.0000 mg/h | INTRAVENOUS | Status: DC
  Administered 2018-09-27: 2 mg/h via INTRAVENOUS
  Filled 2018-09-27: qty 50

## 2018-09-27 MED ORDER — INSULIN ASPART 100 UNIT/ML ~~LOC~~ SOLN
0.0000 [IU] | SUBCUTANEOUS | Status: DC
  Administered 2018-09-27: 8 [IU] via SUBCUTANEOUS
  Administered 2018-09-27: 3 [IU] via SUBCUTANEOUS
  Administered 2018-09-28: 01:00:00 5 [IU] via SUBCUTANEOUS
  Administered 2018-09-28: 12:00:00 2 [IU] via SUBCUTANEOUS
  Administered 2018-09-28: 04:00:00 3 [IU] via SUBCUTANEOUS
  Administered 2018-09-28: 09:00:00 2 [IU] via SUBCUTANEOUS
  Administered 2018-09-29: 3 [IU] via SUBCUTANEOUS
  Administered 2018-09-29 – 2018-10-03 (×3): 2 [IU] via SUBCUTANEOUS

## 2018-09-27 MED ORDER — MIDAZOLAM BOLUS VIA INFUSION
4.0000 mg | INTRAVENOUS | Status: DC | PRN
  Filled 2018-09-27: qty 4

## 2018-09-27 MED ORDER — MIDODRINE HCL 5 MG PO TABS
5.0000 mg | ORAL_TABLET | Freq: Three times a day (TID) | ORAL | Status: DC
  Administered 2018-09-27 – 2018-10-05 (×24): 5 mg via ORAL
  Filled 2018-09-27 (×24): qty 1

## 2018-09-27 MED ORDER — ALBUMIN HUMAN 5 % IV SOLN
12.5000 g | Freq: Once | INTRAVENOUS | Status: AC
  Administered 2018-09-27: 06:00:00 12.5 g via INTRAVENOUS
  Filled 2018-09-27: qty 250

## 2018-09-27 MED ORDER — SODIUM CHLORIDE 0.9% FLUSH: 10.0000 mL | INTRAVENOUS | Status: DC | PRN

## 2018-09-27 MED ORDER — SODIUM CHLORIDE 0.9 % IV SOLN
250.0000 mL | INTRAVENOUS | Status: DC
  Administered 2018-09-30 – 2018-10-03 (×5): 250 mL via INTRAVENOUS
  Administered 2018-10-04: 01:00:00 via INTRAVENOUS
  Administered 2018-10-04: 03:00:00 250 mL via INTRAVENOUS

## 2018-09-27 MED ORDER — NYSTATIN 100000 UNIT/ML MT SUSP
5.0000 mL | Freq: Four times a day (QID) | OROMUCOSAL | Status: DC
  Administered 2018-09-27 – 2018-10-05 (×33): 500000 [IU] via ORAL
  Filled 2018-09-27 (×32): qty 5

## 2018-09-27 MED ORDER — FREE WATER
300.0000 mL | Status: DC
  Administered 2018-09-27: 300 mL

## 2018-09-27 MED ORDER — SODIUM CHLORIDE 0.9 % IV BOLUS
500.0000 mL | Freq: Once | INTRAVENOUS | Status: AC
  Administered 2018-09-27: 500 mL via INTRAVENOUS

## 2018-09-27 MED ORDER — LORAZEPAM 2 MG/ML IJ SOLN
4.0000 mg | Freq: Once | INTRAMUSCULAR | Status: AC
  Administered 2018-09-27: 4 mg via INTRAVENOUS
  Filled 2018-09-27: qty 2

## 2018-09-27 MED ORDER — NYSTATIN 500000 UNITS PO TABS
500000.0000 [IU] | ORAL_TABLET | Freq: Three times a day (TID) | ORAL | Status: DC
  Filled 2018-09-27 (×2): qty 1

## 2018-09-27 MED ORDER — NOREPINEPHRINE 16 MG/250ML-% IV SOLN
0.0000 ug/min | INTRAVENOUS | Status: DC
  Administered 2018-09-27: 2 ug/min via INTRAVENOUS
  Administered 2018-09-28: 08:00:00 10 ug/min via INTRAVENOUS
  Filled 2018-09-27 (×2): qty 250

## 2018-09-27 MED ORDER — FREE WATER
300.0000 mL | Freq: Three times a day (TID) | Status: DC
  Administered 2018-09-27 – 2018-09-28 (×2): 300 mL

## 2018-09-27 MED ORDER — LORAZEPAM 2 MG/ML IJ SOLN: 4.0000 mg | Freq: Once | INTRAMUSCULAR | Status: AC

## 2018-09-27 MED ORDER — CHLORHEXIDINE GLUCONATE CLOTH 2 % EX PADS
6.0000 | MEDICATED_PAD | Freq: Every day | CUTANEOUS | Status: DC
  Administered 2018-09-28 – 2018-10-05 (×10): 6 via TOPICAL

## 2018-09-27 MED ORDER — PRO-STAT SUGAR FREE PO LIQD
30.0000 mL | Freq: Three times a day (TID) | ORAL | Status: DC
  Administered 2018-09-27 – 2018-10-05 (×24): 30 mL
  Filled 2018-09-27 (×23): qty 30

## 2018-09-27 MED ORDER — SODIUM CHLORIDE 0.9 % IV SOLN
1400.0000 mg | Freq: Once | INTRAVENOUS | Status: AC
  Administered 2018-09-27: 1400 mg via INTRAVENOUS
  Filled 2018-09-27: qty 20

## 2018-09-27 NOTE — Progress Notes (Addendum)
4:28 pm Update: Patient not medically stable to discharge tomorrow per MD.      CSW contacted Erline Levine at [facility] to inform of patient's medical readiness to discharge to [facility] tomorrow. They report having a bed for patient, CSW will send discharge summary to [facility] tomorrow once completed.   [place], [place]

## 2018-09-27 NOTE — Progress Notes (Signed)
Pt's BP 81/50 (59), called IM. Giving 500 mL NS bolus. Will update MD on any changes.   Weldon Inches, RN

## 2018-09-27 NOTE — Progress Notes (Signed)
Pt pressure wounds as follows. 3x4cm L heel eschar, 1x1cm L outer ankle, 1x1cm R outer heel, 4x2.5cm 1cm deep upper mid back, and 5x2cm sacrum.

## 2018-09-27 NOTE — Progress Notes (Signed)
Subjective:  Melissa Cochran is a 83 y.o. with PMH of chronic respiratory failure s/p tracheostomy & chronic ventilator, end stage dementia, PEG dependence, A.fib, HTN, CAD s/p stent, HFrEF and recent admission for septic shock 2/2 acinetobacter PNA admit for sepsis on hospital day 1  Melissa Cochran was examined and evaluated at bedside this AM. She was minimally responsive to pain with grimace and intermittent eye-opening on abdominal palpation. Nursing staff at bedside mentions no significant change in mentation.  Spoke with with her daughter on the phone, who states that at baseline, Melissa Cochran opens eye to verbal cues but is non-verbal, non-mobile and unable to follow directions. She states Melissa Cochran's decline in health is chronic but acutely worsened due to multiple episodes of pneumonia and prolonged hospitalizations since last winter. She states she is unable to accurately describe the course of Melissa Cochran's health due to getting most information second-hand from COVID-19 pandemic and resulting social distancing rules.  Objective:  Vital signs in last 24 hours: Vitals:   09/27/18 0400 09/27/18 0411 09/27/18 0500 09/27/18 0600  BP: (!) 101/56  (!) 96/58 102/68  Pulse: 90  89 90  Resp: 18  16 19   Temp: 98.4 F (36.9 C)     TempSrc: Oral     SpO2: 98% 99% 98% 99%  Weight:      Height:       Physical Exam  Constitutional:  Ill-appearing, minimally responseive  HENT:  Mouth/Throat: Oropharynx is clear and moist.  Neck:  Tracheostomy in place, currently on vent  Cardiovascular: Normal rate, normal heart sounds and intact distal pulses.  No murmur heard. Irregular rhythm  Pulmonary/Chest: Effort normal.  Coarse breath sounds, pleural drain noted on R side w/ yellowish drainage  Abdominal: Soft. Bowel sounds are normal. She exhibits distension.  PEG tube in place w/ purulent discharge noted around site  Genitourinary:    Genitourinary Comments: Yellowish brown  non-tarry stool noted   Musculoskeletal: Normal range of motion.  Neurological:  GCS 4  Skin: Skin is warm and dry.  Poor skin turgor. Multiple eschar and pressure ulcers on peripheral extremities   Assessment/Plan:  Principal Problem:   Sepsis ([place]) Active Problems:   Pressure ulcer   Chronic respiratory failure (HCC)   Hypernatremia   AKI (acute kidney injury) [facility])  Melissa Cochran is a 83 yo F w/ PMH of chronic respiratory failure s/p tracheostomy & chronic ventilator, end stage dementia, PEG dependence, A.fib, HTN, CAD s/p stent, HFrEF and recent admission for septic shock 2/2 acinetobacter PNA admit for severe sepsis most likely recurrent PNA vs infected ulcer vs sepsis from urinary source. She has had multiple prolonged admissions for recurrent acetinobacter pneumonia with similar presenting symptoms of fever, hypotension, respiratory distress and encephalopathy concerning for clinical decline. Her prior hospitalizations has been complicated by end-stage dementia and hx of anoxic brain injury with Melissa Cochran being unable to describe her symptoms or make medical decisions. So far her POA [name] has pushed for aggressive interventions despite multiple conversations with palliative about end-of-life and goals of care discussions. Her recurrent septic infections are concerning for worsening outcomes as culture has grown increasingly resistant strains of acetinobacter each admission.  She is currently full code and require further inpatient admission for IV abx treatment and pressure support. High risk for poor outcome.  Hypotension, Fever, Leukocytosis 2/2 Sepsis Hx of recurrent PNA w/ sepsis. Other possible sources include UTI, PEG tube, Trahceostomy site, infected pressure ulcers. Prior culture w/ acetinobacter w/ susceptibility to Unasyn. Resp gram stain  showing multipel gram + and gram negative rods. Blood/urine culture NGTD. WBC 17.7->12.7 Tmas 101.1. Started on vanc, unasyn per night  team - Appreciate PCCM recs: albumin, levophed infusions, c/w IV abx - C/w Vanc, Unasyn per pharmacy - Wound care consult - F/u blood/urine/respitory cultures - Trend CBC - Keep MAP >60 - Tylenol for fever - C/w home meds: midodrine 5mg  TID  Encephalopathy 2/2 sepsis vs hx of anoxic brain injury vs progression of dementia GCS 4. GCS 7 at baseline per family. Non-verbal but eye opening to verbal command. Hx of episodes of status epilepticus. Has known stable meningioma. On valproate 250mg  BID at home - C/w monitor - C/w home meds: valproic acid 250mg  BID  Hypernatremia 2/2 dehydration On chart review, noted to be chronically hypernatremic on prior admissions. Admit NA 151. Calcualted FWD 2.9L Started on D5 75cc/hr  Per night team - Trend BMP - Gentle hydration in setting of HFrEF: C/w D5 75cc/hr  Normocytic anemia 2/2 chronic disease vs GI bleed Baseline hgb ~ 7.5-8.0. Had +FOBT on prior admission. Non-melanistic stools noted during exam. - Trend CBC - Transfuse if hgb <7  Hx of bilateral adnexal masses Noted first 10/2012 w/ progression of mass size noted on 2018 w/o further work-up due to declining health. Possibility of metastatic carcinoma complicated health progress cannot be ruled out. - CT chest / abd / pelvis  Chronic hypoxic respiratory failure On ventilator via tracheostomy. Current O2 sat 99 on vent - Vent settings per PPCM  Chronic systolic heart failure  EF 45-50% on TTE 06/2017. On carvedilol 3.125 BID, furosemide 40mg  BID - Holding diuretics in setting of hypotension  Hx of A.FIB Rate controlled. HR 85. Not on anticoagulation 2/2 hx of GI bleed and anemia - Holding nodal blocking agent in setting of shock  DVT prophx: Lovenox Diet: Tube feeds Code: Full  Dispo: Anticipated discharge in approximately 6-7 day(s).   Mosetta Anis, MD 09/27/2018, 7:42 AM Pager: [phone]

## 2018-09-27 NOTE — Progress Notes (Signed)
Pt seizing Dr. Lynetta Mare called to bedside. ativan 4mg  IV ordered PRN q 108mins while pt is seizing. Order carried out.

## 2018-09-27 NOTE — Progress Notes (Addendum)
Initial Nutrition Assessment  INTERVENTION:   -Change to Osmolite 1.5 @ 40 ml/hr via PEG -Change Prostat to 30 ml TID -D/C Juven BID given severe sepsis -Change 300 ml free water to TID (900 ml total) -Provides 1932 kcal, 110g protein and 2110 ml H2O.  NUTRITION DIAGNOSIS:   Increased nutrient needs related to wound healing as evidenced by estimated needs.  GOAL:   Patient will meet greater than or equal to 90% of their needs  MONITOR:   Vent status, TF tolerance, Labs, Weight trends, I & O's, Skin  REASON FOR ASSESSMENT:   Consult, Ventilator Assessment of nutrition requirement/status, Enteral/tube feeding initiation and management  ASSESSMENT:   83 year old female with chronic hypoxic respiratory failure and ventilator/PEG tube dependence, CAD s/p stent in 2706, chronic systolic heart failure with EF of 45 to 50% by echo on 06/2017, atrial fibrillation (apixaban discontinued at last admission due to concerns for anemia), history of CVA, and seizure disorder who presents from [facility] for elevated BUN and creatinine.  **RD working remotely**  Patient is nonverbal, coming from [place] with chronic trach/vent. Is currently ordered for 70 ml Jevity 1.2 BID and 30 ml Prostat BID via PEG (providing ~166 kcal and 7g protein daily). Contacted MD via secure chat for TF management privileges and consult was placed. Will switch to similar regimen that pt was receiving during previous admission prior to discharging to [facility].   Will discontinue Juven supplements as pt is septic. Once resolved can resume given multiple pressure injuries.  Per weight records, pt's weight has decreased since weights in April 2020. Suspect some degree of weight loss is fluid related. Pt currently +1.6L since admission.  Patient is currently intubated on ventilator support MV: 8.2 L/min Temp (24hrs), Avg:99.3 F (37.4 C), Min:98.3 F (36.8 C), Max:101.1 F (38.4 C)  Medications: Liquid MVI daily, IV D5  at 75 ml/hr Labs reviewed: CBGs: 233 Elevated Na   NUTRITION - FOCUSED PHYSICAL EXAM:  Unable to perform per department requirements to work remotely.  Diet Order:   Diet Order    None      EDUCATION NEEDS:   No education needs have been identified at this time  Skin:  Per WOC note: Skin Integrity Issues:: Unstageable, Stage II, Stage III Stage II: coccyx, left malleolus Stage III: mid-back Unstageable: Lt heel, Rt heel  Last BM:  5/20 -diarrhea  Height:   Ht Readings from Last 1 Encounters:  09/26/18 5\' 7"  (1.702 m)  08/12/18  5\' 4"  (1.626 m)  Weight:   Wt Readings from Last 1 Encounters:  09/26/18 70.1 kg    Ideal Body Weight:  54.5 kg(Ht from April 2020, 5'4")  BMI:  Body mass index is 24.2 kg/m.  Estimated Nutritional Needs:   Kcal:  2376  Protein:  105-115g  Fluid:  1.6L/day  Clayton Bibles, MS, RD, LDN [place] Dietitian Pager: [phone] After Hours Pager: [phone]

## 2018-09-27 NOTE — Progress Notes (Signed)
1000 mL bolus in, increased BP for a a short time, back down to 87/45 (57). MD notified, giving another 500 mL bolus. Will update MD when complete. Possible pressors in future.   Weldon Inches, RN

## 2018-09-27 NOTE — Consult Note (Signed)
[place] Nurse wound consult note Patient receiving care in [facility] 3M05.  This consult was completed remotely by review of record, including posted photos, and a telephone discussion with the primary RN, Danae Chen.  Danae Chen has agreed to measure the wounds today and place those into the flowsheet. Reason for Consult: multiple pressure injuries Wound type: see note for each wound Pressure Injury POA: Yes Measurement: to be provided by bedside RN Wound bed: see photos of wounds Drainage (amount, consistency, odor) no excessive drainage reported, no malodor reported Periwound: intact, fragile  The mid back has a wound appearing consistent with a stage 3, it is pink, clean.  The treatment for this will be placing saline moistened gauze into the wound bed, covering with dry gauze, secure with tape; change each shift. The coccyx has a wound appearing consistent with a stage 2 and just below that at the upper most location of the gluteal fold, there is a wound consistent in appearance with an unstageable PI.  The treatment for both of these will be cleansing the areas, covering each of them with hydrocolloid dressings Kellie Simmering [phone]); change daily.  This will protect these areas from stool, provide moist wound healing, and promote autolytic debridement. The left heel has dry, stable eschar and is consistent with an unstageable PI.  To promote maintenance of the dry, stable eschar, the treatment for this wound will be application of iodine, allow to air dry, wrapping in kerlex; perform daily.  Also, prevalon heel lift boots bilaterally. The left lateral malleolus has a wound that is pink and consistent in appearance with a stage 2 PI.  The treatment for this wound will be cleansing and application of a hydrocolloid dressing Kellie Simmering [phone]) daily. The right heel has a darkened area consistent with an unstageable PI.  The treatment for this will be application of iodine, allow to air dry, wrapping in kerlex; perform daily.  This  will facilitate the development of a dry, stable eschar. Monitor the wound area(s) for worsening of condition such as: Signs/symptoms of infection,  Increase in size,  Development of or worsening of odor, Development of pain, or increased pain at the affected locations.  Notify the medical team if any of these develop.  Thank you for the consult.  Discussed plan of care with the bedside nurse.  [place] nurse will not follow at this time.  Please re-consult the [place] team if needed.  Val Riles, RN, MSN, CWOCN, CNS-BC, pager [phone]

## 2018-09-27 NOTE — Progress Notes (Addendum)
[phone]  NAME:  Melissa Cochran, MRN:  [id number], DOB:  Apr 28, 1936, LOS: 1 ADMISSION DATE:  09/26/2018, CONSULTATION DATE:  5/20 REFERRING MD:  Dr, Thayer Headings, CHIEF COMPLAINT:  Sepsis  Brief History   83 year old female with chronic vent need who resides at [facility]. Recent admissions for sepsis 2/2 acinetobacter PNA. Admitted 5/19 for sepsis. Started on pressors.   History of present illness   83 year old female with past medical history as below, which is significant for chronic vent/trach who resides at [facility].  Other history is significant for CVA, heart failure with reduced ejection fraction (45-50%), and anemia.  She is non-verbal at baseline. She has 2 recent admissions to [facility] for sepsis which was found to secondary to pneumonia with Acinetobacter infection.  In her most recent admission she did require ICU hospitalization and vasoactive infusions for the initial part of her hospitalization. She underwent bronchoscopy emergently for mucous plug. BAL was sent and resulted Acinetobacter. Narrowed to Unasyn. She was discharged to [facility].   Now 5/19 she presented to [facility] for "elevated BUN". She was admitted to the internal medicine teaching service and treated for sepsis of unclear etiology based on fever and leukocytosis. Treated with unasyn/vanco based on previous admission cultures. She was hypotensive and initially responded to IVF, but after 2.5 L with persistent hypotension she was started on phenylephrine and PCCM was called.   Since her last admission she has had R sided chest tube placed, for reasons that are unclear.   Family has been involved in many palliative care discussions and has been steadfast in their desire for aggressive care.   Past Medical History   has a past medical history of Adnexal mass, Anxiety, Arthritis, Bladder tumor, Bleeding ulcer, CHF (congestive heart failure) ([place]), CHF (congestive heart failure) ([place]), Chronic bronchitis ([place]),  Chronic respiratory failure ([place]) (04/19/2018), COPD (chronic obstructive pulmonary disease) ([place]), Coronary artery disease, CVA (cerebral vascular accident) ([place]) (04/19/2018), DVT (deep venous thrombosis) ([place]), GERD (gastroesophageal reflux disease), Heart block, History of bleeding ulcers, History of kidney stones, Hyperlipemia, Hypertension, Myocardial infarction ([place]), Pneumonia, Seizures ([place]) (02/2017 "several"; 06/26/2017 X 1), Shortness of breath dyspnea, and Stroke ([place]) (02/13/2018).  Significant Hospital Events   5/19 admit  Consults:    Procedures:    Significant Diagnostic Tests:    Micro Data:  SARSCOV2 > Negative Urine 5/19 > Blood 5/19 > Sputum 5/19 >  Antimicrobials:  Zosyn ED Vancomycin 5/19 > Unasyn 5/19 >   Interim history/subjective:    Objective   Blood pressure (!) 87/45, pulse 97, temperature 98.3 F (36.8 C), temperature source Oral, resp. rate 20, height _0  (1.702 m), weight 70.1 kg, SpO2 99 %.    Vent Mode: PRVC FiO2 (%):  [30 %-40 %] 40 % Set Rate:  [16 bmp] 16 bmp Vt Set:  [490 mL] 490 mL PEEP:  [5 cmH20] 5 cmH20 Plateau Pressure:  [23 cmH20-24 cmH20] 24 cmH20   Intake/Output Summary (Last 24 hours) at 09/27/2018 0427 Last data filed at 09/27/2018 0141 [facility] per 24 hour  Intake 330 ml  Output 850 ml  Net -520 ml   Filed Weights   09/26/18 2115 09/26/18 2335  Weight: 77.1 kg 70.1 kg    Examination: General: frail elderly female, chronically ill appearing HENT: [place]/AT, PERRL, no JVD. Trach in place with thick yellow secretions Lungs: Clear bilateral breath sounds, diminished bases Cardiovascular: RRR, no MRG Abdomen: Soft, non-tender, non-distended. PEG with purulent drainage at site.  Extremities: Multiple pressure  related wounds as documented with photos in H&P note.  Neuro: at reported baseline, no meaningful response. Moves extremities to pain.  GU: Foley  Resolved Hospital Problem list     Assessment & Plan:   Septic  shock: source not obvious, but multiple potential sources. Pulmonary, wounds, urine, PEG site, all pose a risk. Has had acinetobacter pneumonia on both prior admissions, wondering if this is a colonizer.  - Continue antibiotics per primary - Will swap phenylephrine for norepinephrine - Continue home midodrine.  - Follow cultures - Check cortisol, TSH - Give albumin  Chronic hypoxemic respiratory failure - Full vent support per [facility] settings.  - VAP bundle  AKI: marked elevation in BUN, minimal Creatinine elevation. Raises concern for dehydration, GI bleed.  - Follow BMP - Trend hemoglobin to r/o significant GIB.  - Slow IVF now that she has received 2.5 L  Hypernatremia, Hypermagnesemia  - continue free water - Follow up AM labs to assess change after IVF.  R pleurex tube in place: reason unclear.  - Replace bottle, it has come unattached from tubing and lost suction.   Metabolic alkalosis - Repeat ABG in am, likely will have responded to chloride administration.   Multiple pressure wounds - consider wound care consult  History of AF: currently sinus. Off DOAC due to GIB concerns recently Chronic HFrEF:  - Telemetry monitoring   Oral thrush - nystatin  Seizure history - continue keppra home dose  Anemia - follow CBC  Best practice:  Diet: Tube feeds Pain/Anxiety/Delirium protocol (if indicated): scheduled vicodin VAP protocol (if indicated): Per protocol DVT prophylaxis: Lovenox GI prophylaxis: protonix Glucose control:  Mobility: BR Code Status: FULL Family Communication:  Disposition: ICU  Labs   CBC: Recent Labs  Lab 09/26/18 1749 09/26/18 1758  WBC 17.7*  --   NEUTROABS 15.0*  --   HGB 9.9* 9.9*  HCT 36.7 29.0*  MCV 96.6  --   PLT 245  --     Basic Metabolic Panel: Recent Labs  Lab 09/26/18 1749 09/26/18 1758  NA 150* 151*  K 5.3* 5.2*  CL 112*  --   CO2 29  --   GLUCOSE 137*  --   BUN 109*  --   CREATININE 0.79  --   CALCIUM  10.0  --   MG 3.4*  --    GFR: Estimated Creatinine Clearance: 53.6 mL/min (by C-G formula based on SCr of 0.79 mg/dL). Recent Labs  Lab 09/26/18 1749 09/26/18 1751  WBC 17.7*  --   LATICACIDVEN  --  1.7    Liver Function Tests: Recent Labs  Lab 09/26/18 1749  AST 35  ALT 28  ALKPHOS 80  BILITOT 0.7  PROT 7.1  ALBUMIN 1.8*   No results for input(s): LIPASE, AMYLASE in the last 168 hours. No results for input(s): AMMONIA in the last 168 hours.  ABG    Component Value Date/Time   PHART 7.470 (H) 09/26/2018 1758   PCO2ART 44.2 09/26/2018 1758   PO2ART 65.0 (L) 09/26/2018 1758   HCO3 32.0 (H) 09/26/2018 1758   TCO2 33 (H) 09/26/2018 1758   ACIDBASEDEF 2.0 06/26/2017 0210   O2SAT 93.0 09/26/2018 1758     Coagulation Profile: No results for input(s): INR, PROTIME in the last 168 hours.  Cardiac Enzymes: No results for input(s): CKTOTAL, CKMB, CKMBINDEX, TROPONINI in the last 168 hours.  HbA1C: Hgb A1c MFr Bld  Date/Time Value Ref Range Status  02/13/2017 09:35 PM 5.1 4.8 - 5.6 %  Final    Comment:    (NOTE) Pre diabetes:          5.7%-6.4% Diabetes:              >6.4% Glycemic control for   <7.0% adults with diabetes     CBG: Recent Labs  Lab 09/26/18 2351  GLUCAP 86    Review of Systems:   unable  Past Medical History  She,  has a past medical history of Adnexal mass, Anxiety, Arthritis, Bladder tumor, Bleeding ulcer, CHF (congestive heart failure) ([place]), CHF (congestive heart failure) ([place]), Chronic bronchitis ([place]), Chronic respiratory failure ([place]) (04/19/2018), COPD (chronic obstructive pulmonary disease) ([place]), Coronary artery disease, CVA (cerebral vascular accident) ([place]) (04/19/2018), DVT (deep venous thrombosis) ([place]), GERD (gastroesophageal reflux disease), Heart block, History of bleeding ulcers, History of kidney stones, Hyperlipemia, Hypertension, Myocardial infarction ([place]), Pneumonia, Seizures ([place]) (02/2017 "several"; 06/26/2017 X 1),  Shortness of breath dyspnea, and Stroke ([place]) (02/13/2018).   Surgical History    Past Surgical History:  Procedure Laterality Date  . ABDOMINAL HYSTERECTOMY    . CARDIAC CATHETERIZATION Left 10/21/2015   Procedure: Left Heart Cath and Coronary Angiography;  Surgeon: Isaias Cowman, MD;  Location: [facility];  Service: Cardiovascular;  Laterality: Left;  . CARDIAC CATHETERIZATION N/A 10/21/2015   Procedure: Coronary Stent Intervention;  Surgeon: Isaias Cowman, MD;  Location: [facility];  Service: Cardiovascular;  Laterality: N/A;  . CARDIAC CATHETERIZATION    . CYSTOSCOPY W/ RETROGRADES Bilateral 01/20/2016   Procedure: CYSTOSCOPY WITH RETROGRADE PYELOGRAM;  Surgeon: Hollice Espy, MD;  Location: [facility];  Service: Urology;  Laterality: Bilateral;  . CYSTOSCOPY WITH STENT PLACEMENT Right 01/20/2016   Procedure: CYSTOSCOPY WITH STENT PLACEMENT;  Surgeon: Hollice Espy, MD;  Location: [facility];  Service: Urology;  Laterality: Right;  . HIP FRACTURE SURGERY Left   . STOMACH SURGERY     "stomach ulcers"  . TRACHEOSTOMY TUBE PLACEMENT N/A 03/02/2017   Procedure: TRACHEOSTOMY;  Surgeon: Rozetta Nunnery, MD;  Location: [facility];  Service: ENT;  Laterality: N/A;  . TRANSURETHRAL RESECTION OF BLADDER TUMOR N/A 01/20/2016   Procedure: TRANSURETHRAL RESECTION OF BLADDER TUMOR (TURBT);  Surgeon: Hollice Espy, MD;  Location: [facility];  Service: Urology;  Laterality: N/A;     Social History   reports that she quit smoking about 20 years ago. She has a 40.00 pack-year smoking history. She has never used smokeless tobacco. She reports that she does not drink alcohol or use drugs.   Family History   Her family history includes CVA in her father; Stomach cancer in her mother. There is no history of Bladder Cancer or Kidney cancer.   Allergies No Known Allergies   Home Medications  Prior to Admission medications   Medication Sig Start Date End Date Taking?  Authorizing Provider  acetaminophen (TYLENOL) 160 MG/5ML solution Place 20.3 mLs (650 mg total) into feeding tube every 6 (six) hours as needed for mild pain, headache or fever. Patient not taking: Reported on 08/12/2018 06/19/18   Cherene Altes, MD  Amino Acids-Protein Hydrolys (FEEDING SUPPLEMENT, PRO-STAT SUGAR FREE 64,) LIQD Place 30 mLs into feeding tube 2 (two) times daily. 06/26/18   Cherene Altes, MD  aspirin 81 MG chewable tablet Place 1 tablet (81 mg total) into feeding tube daily. Patient not taking: Reported on 08/12/2018 06/19/18   Cherene Altes, MD  carvedilol (COREG) 3.125 MG tablet Place 1 tablet (3.125 mg total) into feeding tube 2 (two) times daily with  a meal. 08/25/18   Wilber Oliphant, MD  chlorhexidine (PERIDEX) 0.12 % solution 5 mLs See admin instructions. 5 ml's for trach care 2 times a day    [provider]  collagenase (SANTYL) ointment Apply topically daily. Patient not taking: Reported on 08/12/2018 06/18/18   Georgette Shell, MD  famotidine (PEPCID) 20 MG tablet Place 20 mg into feeding tube daily.     [provider]  furosemide (LASIX) 10 MG/ML injection Inject 4 mLs (40 mg total) into the vein 2 (two) times daily. 08/25/18   Wilber Oliphant, MD  HYDROcodone-acetaminophen (NORCO/VICODIN) 5-325 MG tablet Place 1 tablet into feeding tube every 8 (eight) hours. 08/25/18   Wilber Oliphant, MD  ipratropium-albuterol (DUONEB) 0.5-2.5 (3) MG/3ML SOLN Take 3 mLs by nebulization as needed (for acute and chronic respiratory failure, unspecified whether with hypoxia or hypercapnia).     [provider]  levETIRAcetam (KEPPRA) 100 MG/ML solution Place 10 mLs (1,000 mg total) into feeding tube 2 (two) times daily. 06/19/18   Cherene Altes, MD  midodrine (PROAMATINE) 2.5 MG tablet Take 1 tablet (2.5 mg total) by mouth 2 (two) times daily with a meal. 08/25/18   Wilber Oliphant, MD  morphine (MSIR) 15 MG tablet Place 0.5 tablets (7.5 mg total) into feeding  tube every 6 (six) hours as needed (for dyspnea). 08/25/18   Wilber Oliphant, MD  Multiple Vitamin (MULTIVITAMIN) LIQD Place 15 mLs into feeding tube daily. Patient not taking: Reported on 08/12/2018 06/19/18   Cherene Altes, MD  Multiple Vitamin (MULTIVITAMIN) tablet Place 1 tablet into feeding tube daily.    [provider]  nutrition supplement, JUVEN, (JUVEN) PACK Place 1 packet into feeding tube 2 (two) times daily between meals. 06/19/18   Cherene Altes, MD  Nutritional Supplements (FEEDING SUPPLEMENT, VITAL AF 1.2 CAL,) LIQD Place 1,000 mLs into feeding tube continuous. Patient not taking: Reported on 08/12/2018 06/26/18   Cherene Altes, MD  Nutritional Supplements (ISOSOURCE 1.5 CAL) LIQD Place 70 mLs into feeding tube 2 (two) times daily.     [provider]  pantoprazole (PROTONIX) 40 MG injection Inject 40 mg into the vein every 12 (twelve) hours for 4 days. 08/25/18 08/29/18  Wilber Oliphant, MD  potassium chloride 20 MEQ/15ML (10%) SOLN Place 15 mLs (20 mEq total) into feeding tube 2 (two) times daily. 08/25/18   Wilber Oliphant, MD  Sodium Chloride Flush (NORMAL SALINE FLUSH IV) Inject 10 mLs into the vein See admin instructions. Use 10 ml's via IV every shift for any used lumens- flush each unused lumen    [provider]  Valproate Sodium (DEPAKENE) 250 MG/5ML SOLN solution Place 5 mLs (250 mg total) into feeding tube 2 (two) times daily. 06/19/18   Cherene Altes, MD  Water For Irrigation, Sterile (FREE WATER) SOLN Place 300 mLs into feeding tube every 4 (four) hours. 08/25/18   Wilber Oliphant, MD     Critical care time: 35 mins      Georgann Housekeeper, AGACNP-BC [place] Pager [phone] or [phone]  09/27/2018 5:12 AM   Patient seen and examined, agree with above note.  I dictated the care and orders written for this patient under my direction. Septic shockl , unclear source  Chronic vented patient with multiple  potential sources as described above abx , albumin infusion , levophed Follow cultures Very poor prognosis given advanced dementia and multiple comorbidities  Critical care time :  [place], [place], [place]

## 2018-09-27 NOTE — Progress Notes (Signed)
500 mL NS bolus complete, pt's BP still low, 82/51 (60), informed MD. Giving 1000 mL NS bolus. Will inform MD of results.   Weldon Inches, RN

## 2018-09-27 NOTE — Progress Notes (Signed)
1315 Pt transported to [place] on vent without complication

## 2018-09-27 NOTE — Progress Notes (Signed)
EEG Complete  Results Pending 

## 2018-09-28 DIAGNOSIS — J9610 Chronic respiratory failure, unspecified whether with hypoxia or hypercapnia: Secondary | ICD-10-CM

## 2018-09-28 DIAGNOSIS — J9621 Acute and chronic respiratory failure with hypoxia: Secondary | ICD-10-CM | POA: Diagnosis not present

## 2018-09-28 DIAGNOSIS — N179 Acute kidney failure, unspecified: Secondary | ICD-10-CM

## 2018-09-28 DIAGNOSIS — D649 Anemia, unspecified: Secondary | ICD-10-CM | POA: Diagnosis not present

## 2018-09-28 DIAGNOSIS — B370 Candidal stomatitis: Secondary | ICD-10-CM

## 2018-09-28 DIAGNOSIS — Z7189 Other specified counseling: Secondary | ICD-10-CM

## 2018-09-28 DIAGNOSIS — A420 Pulmonary actinomycosis: Secondary | ICD-10-CM

## 2018-09-28 DIAGNOSIS — A427 Actinomycotic sepsis: Secondary | ICD-10-CM

## 2018-09-28 DIAGNOSIS — I2510 Atherosclerotic heart disease of native coronary artery without angina pectoris: Secondary | ICD-10-CM | POA: Diagnosis not present

## 2018-09-28 DIAGNOSIS — I110 Hypertensive heart disease with heart failure: Secondary | ICD-10-CM | POA: Diagnosis not present

## 2018-09-28 DIAGNOSIS — Z9911 Dependence on respirator [ventilator] status: Secondary | ICD-10-CM | POA: Diagnosis not present

## 2018-09-28 DIAGNOSIS — Z20828 Contact with and (suspected) exposure to other viral communicable diseases: Secondary | ICD-10-CM | POA: Diagnosis not present

## 2018-09-28 DIAGNOSIS — E870 Hyperosmolality and hypernatremia: Secondary | ICD-10-CM | POA: Diagnosis not present

## 2018-09-28 DIAGNOSIS — I4891 Unspecified atrial fibrillation: Secondary | ICD-10-CM | POA: Diagnosis not present

## 2018-09-28 DIAGNOSIS — I5022 Chronic systolic (congestive) heart failure: Secondary | ICD-10-CM | POA: Diagnosis not present

## 2018-09-28 DIAGNOSIS — G40909 Epilepsy, unspecified, not intractable, without status epilepticus: Secondary | ICD-10-CM | POA: Diagnosis not present

## 2018-09-28 DIAGNOSIS — J151 Pneumonia due to Pseudomonas: Secondary | ICD-10-CM | POA: Diagnosis not present

## 2018-09-28 DIAGNOSIS — Z515 Encounter for palliative care: Secondary | ICD-10-CM

## 2018-09-28 DIAGNOSIS — R6521 Severe sepsis with septic shock: Secondary | ICD-10-CM | POA: Diagnosis not present

## 2018-09-28 DIAGNOSIS — A419 Sepsis, unspecified organism: Secondary | ICD-10-CM | POA: Diagnosis not present

## 2018-09-28 LAB — GLUCOSE, CAPILLARY
Glucose-Capillary: 185 mg/dL — ABNORMAL HIGH (ref 70–99)
Glucose-Capillary: 142 mg/dL — ABNORMAL HIGH (ref 70–99)
Glucose-Capillary: 138 mg/dL — ABNORMAL HIGH (ref 70–99)
Glucose-Capillary: 96 mg/dL (ref 70–99)
Glucose-Capillary: 116 mg/dL — ABNORMAL HIGH (ref 70–99)
Glucose-Capillary: 149 mg/dL — ABNORMAL HIGH (ref 70–99)

## 2018-09-28 LAB — BASIC METABOLIC PANEL
Anion gap: 8 (ref 5–15)
BUN: 66 mg/dL — ABNORMAL HIGH (ref 8–23)
CO2: 23 mmol/L (ref 22–32)
Calcium: 8.6 mg/dL — ABNORMAL LOW (ref 8.9–10.3)
Chloride: 115 mmol/L — ABNORMAL HIGH (ref 98–111)
Creatinine, Ser: 0.57 mg/dL (ref 0.44–1.00)
GFR calc Af Amer: >60 mL/min (ref >60)
GFR calc non Af Amer: >60 mL/min (ref >60)
Glucose, Bld: 144 mg/dL — ABNORMAL HIGH (ref 70–99)
Potassium: 4.3 mmol/L (ref 3.5–5.1)
Sodium: 146 mmol/L — ABNORMAL HIGH (ref 135–145)

## 2018-09-28 LAB — CBC
HCT: 33.7 % — ABNORMAL LOW (ref 36.0–46.0)
Hemoglobin: 9 g/dL — ABNORMAL LOW (ref 12.0–15.0)
MCH: 26 pg (ref 26.0–34.0)
MCHC: 26.7 g/dL — ABNORMAL LOW (ref 30.0–36.0)
MCV: 97.4 fL (ref 80.0–100.0)
Platelets: 322 10*3/uL (ref 150–400)
RBC: 3.46 MIL/uL — ABNORMAL LOW (ref 3.87–5.11)
RDW: 19.3 % — ABNORMAL HIGH (ref 11.5–15.5)
WBC: 13.6 10*3/uL — ABNORMAL HIGH (ref 4.0–10.5)
nRBC: 1.3 % — ABNORMAL HIGH (ref 0.0–0.2)

## 2018-09-28 LAB — MAGNESIUM: Magnesium: 2.6 mg/dL — ABNORMAL HIGH (ref 1.7–2.4)

## 2018-09-28 LAB — PHOSPHORUS: Phosphorus: 3 mg/dL (ref 2.5–4.6)

## 2018-09-28 MED ORDER — FREE WATER
300.0000 mL | Freq: Four times a day (QID) | Status: DC
  Administered 2018-09-28 – 2018-09-29 (×4): 300 mL

## 2018-09-28 MED ORDER — SODIUM CHLORIDE 0.9 % IV SOLN
2.0000 g | INTRAVENOUS | Status: DC
  Administered 2018-09-28 – 2018-10-02 (×5): 2 g via INTRAVENOUS
  Filled 2018-09-28 (×5): qty 20

## 2018-09-28 NOTE — Procedures (Signed)
ELECTROENCEPHALOGRAM REPORT   Patient: Melissa Cochran       Room #: [facility] EEG No. ID: [id number] Age: 83 y.o.        Sex: female Referring Physician: Daryll Drown Report Date:  09/28/2018        Interpreting Physician: Alexis Goodell  History: AMYRAH PINKHASOV is an 83 y.o. female with witnessed seizures  Medications:  Unasyn, ASA< Norco, Insulin, Keppra, Ativan, Proamatine, Dilantin, Depakote, Vancomycin, Versed  Conditions of Recording:  This is a 21 channel routine scalp EEG performed with bipolar and monopolar montages arranged in accordance to the international 10/20 system of electrode placement. One channel was dedicated to EKG recording.  The patient is in the intubated and sedated state.  Patient has received multiple doses of Ativan.    Description:  The background activity is markedly attenuated.  With increase in sensitivity to 3uV/mm a burst suppression rhythm is noted.  This burst suppression rhythm consists of periods of attenuation lasing up to 10 seconds alternation with relative bursts of a mixture of frequencies lasting up to 2 seconds.  This discontinuous rhythm is often asynchronous between the hemispheres.  There is also noted intermittent sharp waves emanating from the right frontal region.  They are not at a frequency to suggest electrographic seizure activity .   Hyperventilation and intermittent photic stimulation were not performed.  IMPRESSION: This is an abnormal EEG due to a markedly attenuated burst-suppression pattern that is asynchronous seen throughout the tracing.  Burst suppression pattern can be seen in a variety of circumstances, including anesthesia, drug intoxication, hypothermia, as well as cerebral anoxia.  Clinical/neurological, and radiographic correlation advised.   Also noted are intermittent right hemispheric sharp waves consistent with cortical irritability in that region and consistent with previous recordings.  No evidence of electrographic  seizure activity is noted.     Alexis Goodell, MD Neurology [phone] 09/28/2018, 8:33 AM

## 2018-09-28 NOTE — Progress Notes (Signed)
Subjective:  CHOLE DRIVER is a 83 y.o. F with PMH of chronic respiratory failure s/p tracheostomy & chronic ventilator, end stage dementia, PEG dependence, A.fib, HTN, CAD s/p stent, HFrEF and recent admission for septic shock 2/2 acinetobacter PNA  admit for sepsis on hospital day 2  Mrs.Shull was examined and evaluated at bedside this AM. Observed unchanged from prior with minimal response to painful stimuli. Unable to elicit eye-opening this AM. Nursing staff at bedside mentions no further seizure-like activity since yesterday afternoon.  Objective:  Vital signs in last 24 hours: Vitals:   09/28/18 0405 09/28/18 0500 09/28/18 0600 09/28/18 0645  BP:  (!) 110/53 (!) 97/49 (!) 96/53  Pulse:    72  Resp:  18 20 20   Temp:      TempSrc:      SpO2:    98%  Weight: 75.2 kg     Height:       Gen: ill-appearing, non-responsive Neck: tracheostomy in place, on vent CV: RRR, S1, S2 normal, No rubs, no murmurs, no gallops Pulm: Coarse breath sounds Abd: Distended abdomen, PEG tube in place, Rectal tube output showing light brown non-tarry stool Extm:Peripheral pulses intact, No peripheral edema Neuro: Unresponsive to painful stimuli Skin: Dry, Warm, multiple pressure ulcers covered with bandaging, did not remove  Assessment/Plan:  Principal Problem:   Severe sepsis with septic shock (HCC) Active Problems:   Pressure ulcer   Chronic respiratory failure (HCC)   Hypernatremia   AKI (acute kidney injury) (HCC)   Febrile illness, acute  Mrs.Pizzi is a 83 yo F w/ PMH of chronic respiratory failure s/p tracheostomy & chronic ventilator, end stage dementia, PEG dependence, A.fib, HTN, CAD s/p stent, HFrEF and recent admission for septic shock 2/2 acinetobacter PNA admit for severe sepsis 2/2 urinary source. PCCM states w/ her hx of multiple PNAs, acinetobacter may be colonizer and not the primary cause of her systemic symptoms and narrowed abx to ceftriaxone. Her urinary  culture is shown to be growing proteus mirabilis this AM. No other culture data has been resulted at the moment. She is continuing to require pressor support and has been minimally responsive since admission. EEG after her seizure yesterday showing burst-suppression pattern concerning for cerebral anoxia vs drug intoxication vs hypothermia. She has known hx of anoxic brain injury during prior code. No further seizure activity since last night. Will continue to monitor.  Hypotension, Fever, Leukocytosis 2/2 Sepsis Urine culture growing proteus mirabilis. Chest CT showing diffuse tree-in-bud ground-glass nodularity bilaterally consistent w/ multifocal pneumonia. Respiratory gram showing gram + rods. Blood/resp culture NGTD. Cannot rule out infected ulcers, trach, PEG. WBC 15.7->13.6 Afebrile overnight. BP 96/53 w/ MAP of 63. Currently requiring 9 mcg/min levo.  - Appreciate PCCM recs: narrow to ceftriaxone, norepinephrine taper - C/w Vanc, Unasyn per pharmacy - Appreciate wound care recs - F/u blood/urine/respitory cultures - Trend CBC - Keep MAP >60 - Tylenol for fever - C/w home meds: midodrine 5mg  TID  Seizure 2/2 hx of seizure disorder & hx of anoxic brain injury Seizure activity last night around 5pm resolved w/ Ativan 4mg  IV x3. EEG w/ attenuated burst-suppresion pattern concerning for cerebral anoxia vs anesthesia vs hypothermia. Chart review shows hx of significant seizure disorder w/ prior episodes requiring induced coma. No further seizures since last night. - C/w monitor - C/w home meds: valproic acid 250mg  BID, levetiracetam 1000mg  BID  Hypernatremia 2/2 dehydration On chart review, noted to be chronically hypernatremic on prior admissions. Na 151->146. Initial free water  deficit ~2L. Was on D5 @ 75cc/hr for ~36 hrs, + free water 300cc x2 - Trend BMP - Free water amps per tube q6hrs - D/c maintenance fluid  Oral thrush - Nystatin per PCCM  Normocytic anemia 2/2 chronic  disease Hemoglobin trend 7.7->9.0. MCV 97.4 Prior FOBT+ most likely 2/2 microtrauma from rectal tube. Non-melanistic stool on rectal bag this AM. - C/w monitor  Hx of bilateral adnexal masses Noted first 10/2012 w/ progression of mass size noted on 2018 w/o further work-up due to declining health. CT abd/pelvis showing stable 8.9 by 7.5cm multicystic mass in L adenxa w/o significant change from prior. Low suspicion for malignancy per radiology. - F/u w/ outpatient Ob/Gyn  Chronic hypoxic respiratory failure On ventilator via tracheostomy. Current O2 sat 99 on vent - Vent settings per PPCM  Chronic systolic heart failure  EF 45-50% on TTE 06/2017. On carvedilol 3.125 BID, furosemide 40mg  BID at home - Holding diuretics in setting of hypotension  Hx of A.FIB Rate controlled. HR 73. Not on anticoagulation 2/2 hx of GI bleed and anemia - C/w telemetry  DVT prophx: Lovenox Diet: Tube feeds Code: Full  Dispo: Anticipated discharge in approximately 4-5 day(s).   Mosetta Anis, MD 09/28/2018, 6:59 AM Pager: [phone]

## 2018-09-28 NOTE — Consult Note (Signed)
Consultation Note Date: 09/28/2018   Patient Name: Melissa Cochran  DOB: 12-26-1935  MRN: [id number]  Age / Sex: 83 y.o., female  PCP: [name], MD Referring Physician: Sid Falcon, MD  Reason for Consultation: Establishing goals of care  HPI/Patient Profile: 83 y.o. female  with past medical history of chronic respiratory failure, ventilator dependence, PEG dependence, CAD, heart failure, a fib, CVA, and seizures admitted on 09/26/2018 with elevated BUN. [facility]. Patient has had recent admissions for sepsis r/t pna.   She is being treated for sepsis of unknown etiology this admission. She remained hypotensive after fluids and was started on pressors. Report of seizure activity on 5/20. PMT consulted for [place].  Clinical Assessment and Goals of Care: Melissa Cochran is well known to PMT - we have met with her daughter several times with several different providers over the past two years. Her daughter's goals have remained unchanged - aggressive measures and prolonging life are her main concerns. In the past she has indicated that she would want full scope treatment even if patient were unable to walk, talk, interact, eat, or breathe on her own - which unfortunately appears to be the situation we are in now.  I called and spoke with [name] again today. She is familiar with palliative medicine and understands our role in the patient's care.  She provided an update about how the patient has been doing since her discharge in April. She shares that she has been "stable" per reports from the nurses. She says she gets mixed reports from the nurses about how interactive her mother is - some tell her that Melissa Cochran will open her eyes when she is spoken to and others tell her that Melissa Cochran shows no signs of responsiveness.   [name] shares the difficulties of having her mother in a facility right now d/t strict visitor  restrictions. She has been unable to see her mother since March 16th.   [name] tells me that her ultimate hope is that her mother will eventually be weaned from the ventilator and be able to be placed in a facility closer to her in [place]. She tells me they were getting close to that goal back in December (patient sustaining periods off of ventilator, using PMV and tolerating small amount of PO intake) but Melissa Cochran has declined since then and it seems to be "one complication after another".  [name] remains firm in her desire to continue all aggressive medical interventions offered.  Questions and concerns addressed. Encouraged [name] to call with questions or concerns.   Primary Decision Maker NEXT OF KIN - daughter [name]    SUMMARY OF RECOMMENDATIONS   - multiple Tina discussions in past - maintains desire for full scope/ full code - clinical update provided to daughter - will shadow chart  Code Status/Advance Care Planning:  Full code  Prognosis:   Unable to determine  Discharge Planning: To Be Determined - suspect [facility]?      Primary Diagnoses: Present on Admission: . Severe sepsis with septic shock ([place]) . Chronic respiratory failure ([place])   I have reviewed the medical record, interviewed the patient and family, and examined the patient. The following aspects are pertinent.  Past Medical History:  Diagnosis Date  . Adnexal mass   . Anxiety   . Arthritis   . Bladder tumor   . Bleeding ulcer   . CHF (congestive heart failure) ([place])   . CHF (congestive heart failure) ([place])   .  Chronic bronchitis ([place])   . Chronic respiratory failure ([place]) 04/19/2018  . COPD (chronic obstructive pulmonary disease) ([place])   . Coronary artery disease   . CVA (cerebral vascular accident) ([place]) 04/19/2018  . DVT (deep venous thrombosis) (HCC)    RLE  . GERD (gastroesophageal reflux disease)   . Heart block    left  . History of bleeding ulcers   . History of kidney  stones   . Hyperlipemia   . Hypertension   . Myocardial infarction ([place])    "slight one" (07/05/2017)  . Pneumonia    "several times" (07/05/2017)  . Seizures ([place]) 02/2017 "several"; 06/26/2017 X 1  . Shortness of breath dyspnea   . Stroke [facility]) 02/13/2018   "still right sided weakness" (07/05/2017)   Social History   Socioeconomic History  . Marital status: Widowed    Spouse name: Not on file  . Number of children: Not on file  . Years of education: Not on file  . Highest education level: Not on file  Occupational History  . Not on file  Social Needs  . Financial resource strain: Not on file  . Food insecurity:    Worry: Not on file    Inability: Not on file  . Transportation needs:    Medical: Not on file    Non-medical: Not on file  Tobacco Use  . Smoking status: Former Smoker    Packs/day: 1.00    Years: 40.00    Pack years: 40.00    Last attempt to quit: 06/13/1998    Years since quitting: 20.3  . Smokeless tobacco: Never Used  Substance and Sexual Activity  . Alcohol use: No  . Drug use: No  . Sexual activity: Not Currently  Lifestyle  . Physical activity:    Days per week: Not on file    Minutes per session: Not on file  . Stress: Not on file  Relationships  . Social connections:    Talks on phone: Not on file    Gets together: Not on file    Attends religious service: Not on file    Active member of club or organization: Not on file    Attends meetings of clubs or organizations: Not on file    Relationship status: Not on file  Other Topics Concern  . Not on file  Social History Narrative  . Not on file   Family History  Problem Relation Age of Onset  . Stomach cancer Mother   . CVA Father   . Bladder Cancer Neg Hx   . Kidney cancer Neg Hx    Scheduled Meds: . aspirin  81 mg Per Tube Daily  . chlorhexidine gluconate (MEDLINE KIT)  15 mL Mouth Rinse BID  . Chlorhexidine Gluconate Cloth  6 each Topical Daily  . enoxaparin (LOVENOX) injection  40  mg Subcutaneous Q24H  . feeding supplement (PRO-STAT SUGAR FREE 64)  30 mL Per Tube TID  . free water  300 mL Per Tube Q6H  . HYDROcodone-acetaminophen  1 tablet Per Tube Q8H  . insulin aspart  0-15 Units Subcutaneous Q4H  . levETIRAcetam  1,000 mg Per Tube BID  . mouth rinse  15 mL Mouth Rinse 10 times per day  . midodrine  5 mg Oral TID WC  . multivitamin  15 mL Per Tube Daily  . nystatin  5 mL Oral QID  . pantoprazole sodium  40 mg Per Tube Daily  . phenytoin (DILANTIN) IV  100 mg Intravenous Q8H  .  sodium chloride flush  10-40 mL Intracatheter Q12H  . valproic acid  250 mg Per Tube BID   Continuous Infusions: . sodium chloride 20 mL/hr at 09/27/18 1800  . cefTRIAXone (ROCEPHIN)  IV 2 g (09/28/18 1038)  . feeding supplement (OSMOLITE 1.5 CAL) 40 mL/hr at 09/27/18 1800  . midazolam Stopped (09/27/18 1847)  . norepinephrine (LEVOPHED) Adult infusion 10 mcg/min (09/28/18 0800)   PRN Meds:.acetaminophen, ipratropium-albuterol, midazolam, sodium chloride flush No Known Allergies  Vital Signs: BP (!) 99/49   Pulse 70   Temp 99.9 F (37.7 C) (Oral)   Resp (!) 21   Ht '5\' 7"'$  (1.702 m)   Wt 75.2 kg   SpO2 94%   BMI 25.97 kg/m  Pain Scale: CPOT   Pain Score: Asleep   SpO2: SpO2: 94 % O2 Device:SpO2: 94 % O2 Flow Rate: .   IO: Intake/output summary:   Intake/Output Summary (Last 24 hours) at 09/28/2018 1252 Last data filed at 09/28/2018 0600 Gross per 24 hour  Intake 3894.02 ml  Output 1475 ml  Net 2419.02 ml    LBM: Last BM Date: 09/28/18 Baseline Weight: Weight: 77.1 kg Most recent weight: Weight: 75.2 kg     Palliative Assessment/Data: PPS 30% (tube feeds)    The above conversation was completed via telephone due to the visitor restrictions during the COVID-19 pandemic. Thorough chart review and discussion with necessary members of the care team was completed as part of assessment. All issues were discussed and addressed but no physical exam was performed.  Time  Total: 60 minutes Greater than 50%  of this time was spent counseling and coordinating care related to the above assessment and plan.  Juel Burrow, DNP, AGNP-C Palliative Medicine Team [phone] Pager: [phone]

## 2018-09-28 NOTE — Progress Notes (Signed)
[phone]  NAME:  Melissa Cochran, MRN:  [id number], DOB:  1936/04/18, LOS: 2 ADMISSION DATE:  09/26/2018, CONSULTATION DATE:  5/20 REFERRING MD:  Dr, Thayer Headings, CHIEF COMPLAINT:  Sepsis  Brief History   83 year old female with chronic vent need who resides at [facility]. Recent admissions for sepsis 2/2 acinetobacter PNA. Admitted 5/19 for sepsis. Started on pressors.   History of present illness   83 year old female with past medical history as below, which is significant for chronic vent/trach who resides at [facility].  Other history is significant for CVA, heart failure with reduced ejection fraction (45-50%), and anemia.  She is non-verbal at baseline. She has 2 recent admissions to [facility] for sepsis which was found to secondary to pneumonia with Acinetobacter infection.  In her most recent admission she did require ICU hospitalization and vasoactive infusions for the initial part of her hospitalization. She underwent bronchoscopy emergently for mucous plug. BAL was sent and resulted Acinetobacter. Narrowed to Unasyn. She was discharged to [facility].   Now 5/19 she presented to [facility] for "elevated BUN". She was admitted to the internal medicine teaching service and treated for sepsis of unclear etiology based on fever and leukocytosis. Treated with unasyn/vanco based on previous admission cultures. She was hypotensive and initially responded to IVF, but after 2.5 L with persistent hypotension she was started on phenylephrine and PCCM was called.   Since her last admission she has had R sided chest tube placed, for reasons that are unclear.   Family has been involved in many palliative care discussions and has been steadfast in their desire for aggressive care.   Past Medical History   has a past medical history of Adnexal mass, Anxiety, Arthritis, Bladder tumor, Bleeding ulcer, CHF (congestive heart failure) ([place]), CHF (congestive heart failure) ([place]), Chronic bronchitis ([place]),  Chronic respiratory failure ([place]) (04/19/2018), COPD (chronic obstructive pulmonary disease) ([place]), Coronary artery disease, CVA (cerebral vascular accident) ([place]) (04/19/2018), DVT (deep venous thrombosis) ([place]), GERD (gastroesophageal reflux disease), Heart block, History of bleeding ulcers, History of kidney stones, Hyperlipemia, Hypertension, Myocardial infarction ([place]), Pneumonia, Seizures ([place]) (02/2017 "several"; 06/26/2017 X 1), Shortness of breath dyspnea, and Stroke ([place]) (02/13/2018).  Significant Hospital Events   5/19 admit 5/20 facial twitching history of seizures  Consults:    Procedures:    Significant Diagnostic Tests:  EEG 5/20 >> no seizure activity, intermittent right hemispheric sharp waves-cortical irritability as prior  Micro Data:  SARSCOV2 > Negative Urine 5/19 >> proteus >> Blood 5/19 > Sputum 5/19 >  Antimicrobials:  Zosyn ED Vancomycin 5/19 >>5/21 Unasyn 5/19 > 5/21 ceftx 5/21 >>  Interim history/subjective:   Remains critically ill, on mechanical ventilation On lower doses of Levophed Seizure activity on 5/20 evening but has not recurred  Objective   Blood pressure (!) 112/53, pulse 72, temperature 99.3 F (37.4 C), temperature source Oral, resp. rate 19, height _0  (1.702 m), weight 75.2 kg, SpO2 98 %.    Vent Mode: PRVC FiO2 (%):  [40 %] 40 % Set Rate:  [16 bmp] 16 bmp Vt Set:  [490 mL] 490 mL PEEP:  [5 cmH20] 5 cmH20 Plateau Pressure:  [23 cmH20-27 cmH20] 24 cmH20   Intake/Output Summary (Last 24 hours) at 09/28/2018 0936 Last data filed at 09/28/2018 0600 Gross per 24 hour  Intake 4163.94 ml  Output 1775 ml  Net 2388.94 ml   Filed Weights   09/26/18 2115 09/26/18 2335 09/28/18 0405  Weight: 77.1 kg 70.1 kg 75.2 kg  Examination: General: frail elderly female, chronically ill appearing HENT: [place]/AT, PERRL, no JVD. Trach in place with thick yellow secretions Lungs: Clear bilateral breath sounds, diminished bases  Cardiovascular: RRR, no MRG Abdomen: Soft, non-tender, non-distended. PEG with purulent drainage at site.  Extremities: Multiple pressure related wounds as documented prior Neuro: Unresponsive, no meaningful response. Moves extremities to pain.  GU: Foley  Resolved Hospital Problem list     Assessment & Plan:   Septic shock: source not obvious, but multiple potential sources. Pulmonary, wounds, urine, PEG site-growing Proteus from urine, has had acinetobacter pneumonia on both prior admissions, wondering if this is a colonizer.  -cortisol 13 -Unasyn to ceftriaxone , dc vanc -Continue to taper norepinephrine - Continue home midodrine.  - Follow cultures   Chronic hypoxemic respiratory failure - Full vent support per [facility] settings.  - VAP bundle  AKI: marked elevation in BUN, minimal Creatinine elevation. Raises concern for dehydration, GI bleed.  - dc IVF   Hypernatremia, Hypermagnesemia  - continue free water   R pleurex tube in place: reason unclear.  -Does not need drainage currently since no effusions   Multiple pressure wounds -  wound care consult  History of AF: currently sinus. Off DOAC due to GIB concerns recently Chronic HFrEF:  - Telemetry monitoring   Oral thrush - nystatin  Seizure history , recurrence 5/20 - continue keppra & valproate -loaded with dilantin 5/20 & ct maintenance dose   Anemia - follow CBC  Best practice:  Diet: Tube feeds Pain/Anxiety/Delirium protocol (if indicated): scheduled vicodin VAP protocol (if indicated): Per protocol DVT prophylaxis: Lovenox GI prophylaxis: protonix Glucose control:  Mobility: BR Code Status: FULL Family Communication: daughter Disposition: ICU  The patient is critically ill with multiple organ systems failure and requires high complexity decision making for assessment and support, frequent evaluation and titration of therapies, application of advanced monitoring technologies and extensive  interpretation of multiple databases. Critical Care Time devoted to patient care services described in this note independent of APP/resident  time is 35 minutes.   Kara Mead MD. Shade Flood. [facility] Pager [phone] If no response call [phone]   09/28/2018  Rigoberto Noel, MD

## 2018-09-28 NOTE — Progress Notes (Signed)
  Date: 09/28/2018  Patient name: Melissa Cochran  Medical record number: [id number]  Date of birth: [birth year]   I have seen and evaluated this patient and I have discussed the plan of care with the house staff. Please see Dr. Marguerita Beards note for complete details. I concur with his findings and plan.  PCCM following and making modifications as noted in Dr. Marguerita Beards note.  This patient may be more appropriate for PCCM management as she is on ventilator and pressor support.  Will discuss with PCCM team.   Sid Falcon, MD 09/28/2018, 12:28 PM

## 2018-09-29 DIAGNOSIS — J9610 Chronic respiratory failure, unspecified whether with hypoxia or hypercapnia: Secondary | ICD-10-CM | POA: Diagnosis not present

## 2018-09-29 DIAGNOSIS — N179 Acute kidney failure, unspecified: Secondary | ICD-10-CM | POA: Diagnosis not present

## 2018-09-29 DIAGNOSIS — Z20828 Contact with and (suspected) exposure to other viral communicable diseases: Secondary | ICD-10-CM | POA: Diagnosis not present

## 2018-09-29 DIAGNOSIS — Z930 Tracheostomy status: Secondary | ICD-10-CM

## 2018-09-29 DIAGNOSIS — J9621 Acute and chronic respiratory failure with hypoxia: Secondary | ICD-10-CM | POA: Diagnosis not present

## 2018-09-29 DIAGNOSIS — J151 Pneumonia due to Pseudomonas: Secondary | ICD-10-CM | POA: Diagnosis not present

## 2018-09-29 DIAGNOSIS — A419 Sepsis, unspecified organism: Secondary | ICD-10-CM | POA: Diagnosis not present

## 2018-09-29 DIAGNOSIS — R6521 Severe sepsis with septic shock: Secondary | ICD-10-CM | POA: Diagnosis not present

## 2018-09-29 DIAGNOSIS — Z9911 Dependence on respirator [ventilator] status: Secondary | ICD-10-CM | POA: Diagnosis not present

## 2018-09-29 LAB — RENAL FUNCTION PANEL
Albumin: 1.6 g/dL — ABNORMAL LOW (ref 3.5–5.0)
Anion gap: 11 (ref 5–15)
BUN: 40 mg/dL — ABNORMAL HIGH (ref 8–23)
CO2: 26 mmol/L (ref 22–32)
Calcium: 9 mg/dL (ref 8.9–10.3)
Chloride: 113 mmol/L — ABNORMAL HIGH (ref 98–111)
Creatinine, Ser: 0.46 mg/dL (ref 0.44–1.00)
GFR calc Af Amer: >60 mL/min (ref >60)
GFR calc non Af Amer: >60 mL/min (ref >60)
Glucose, Bld: 143 mg/dL — ABNORMAL HIGH (ref 70–99)
Phosphorus: 1.8 mg/dL — ABNORMAL LOW (ref 2.5–4.6)
Potassium: 4.2 mmol/L (ref 3.5–5.1)
Sodium: 150 mmol/L — ABNORMAL HIGH (ref 135–145)

## 2018-09-29 LAB — URINE CULTURE: Culture: >=100000 — AB

## 2018-09-29 LAB — CBC
HCT: 30.3 % — ABNORMAL LOW (ref 36.0–46.0)
Hemoglobin: 8.2 g/dL — ABNORMAL LOW (ref 12.0–15.0)
MCH: 25.9 pg — ABNORMAL LOW (ref 26.0–34.0)
MCHC: 27.1 g/dL — ABNORMAL LOW (ref 30.0–36.0)
MCV: 95.9 fL (ref 80.0–100.0)
Platelets: 249 10*3/uL (ref 150–400)
RBC: 3.16 MIL/uL — ABNORMAL LOW (ref 3.87–5.11)
RDW: 19.7 % — ABNORMAL HIGH (ref 11.5–15.5)
WBC: 10.3 10*3/uL (ref 4.0–10.5)
nRBC: 0.9 % — ABNORMAL HIGH (ref 0.0–0.2)

## 2018-09-29 LAB — GLUCOSE, CAPILLARY
Glucose-Capillary: 100 mg/dL — ABNORMAL HIGH (ref 70–99)
Glucose-Capillary: 119 mg/dL — ABNORMAL HIGH (ref 70–99)
Glucose-Capillary: 151 mg/dL — ABNORMAL HIGH (ref 70–99)
Glucose-Capillary: 106 mg/dL — ABNORMAL HIGH (ref 70–99)
Glucose-Capillary: 94 mg/dL (ref 70–99)

## 2018-09-29 LAB — MAGNESIUM: Magnesium: 2.5 mg/dL — ABNORMAL HIGH (ref 1.7–2.4)

## 2018-09-29 MED ORDER — FREE WATER
300.0000 mL | Status: DC
  Administered 2018-09-29 – 2018-10-05 (×37): 300 mL

## 2018-09-29 MED ORDER — MIDAZOLAM BOLUS VIA INFUSION
2.0000 mg | INTRAVENOUS | Status: DC | PRN
  Filled 2018-09-29: qty 4

## 2018-09-29 MED ORDER — HYDROCODONE-ACETAMINOPHEN 5-325 MG PO TABS
1.0000 | ORAL_TABLET | Freq: Four times a day (QID) | ORAL | Status: DC | PRN
  Administered 2018-10-02 – 2018-10-05 (×3): 1
  Filled 2018-09-29 (×3): qty 1

## 2018-09-29 MED ORDER — POTASSIUM PHOSPHATES 15 MMOLE/5ML IV SOLN
30.0000 mmol | Freq: Once | INTRAVENOUS | Status: AC
  Administered 2018-09-29: 11:00:00 30 mmol via INTRAVENOUS
  Filled 2018-09-29: qty 10

## 2018-09-29 NOTE — Progress Notes (Signed)
During patient's bath discovered that her Pleurx drain was not capped or clamped on the end. Dressing at tube entrance, but pigtail end hanging open to air. Cleaned well and assessed. Site entrance red/pink but without obvious drainage. Ordered kit to redress and cap site and call placed to Elink to see if we need to proceed with any other steps.   Milford Cage, RN

## 2018-09-29 NOTE — Progress Notes (Signed)
Flexiseal came out of place during patient bath. Unable to replace flexiseal x2 d/t very poor rectal tone. Placed rectal pouch and completed bath.  Melissa Cochran 09/29/18 10:39 PM

## 2018-09-29 NOTE — Progress Notes (Addendum)
[id number]  NAME:  Melissa Cochran, MRN:  [id number], DOB:  1935-12-08, LOS: 3 ADMISSION DATE:  09/26/2018, CONSULTATION DATE:  5/20 REFERRING MD:  Dr, Thayer Headings, CHIEF COMPLAINT:  Sepsis  Brief History   83 year old female with chronic vent need who resides at [facility]. Recent admissions for sepsis 2/2 acinetobacter PNA. Admitted 5/19 for sepsis. Started on pressors.   Since her last admission she has had R sided chest tube placed, for reasons that are unclear.   Family has been involved in many palliative care discussions and has been steadfast in their desire for aggressive care.   Past Medical History   has a past medical history of Adnexal mass, Anxiety, Arthritis, Bladder tumor, Bleeding ulcer, CHF (congestive heart failure) ([place]), CHF (congestive heart failure) ([place]), Chronic bronchitis ([place]), Chronic respiratory failure ([place]) (04/19/2018), COPD (chronic obstructive pulmonary disease) ([place]), Coronary artery disease, CVA (cerebral vascular accident) ([place]) (04/19/2018), DVT (deep venous thrombosis) ([place]), GERD (gastroesophageal reflux disease), Heart block, History of bleeding ulcers, History of kidney stones, Hyperlipemia, Hypertension, Myocardial infarction ([place]), Pneumonia, Seizures ([place]) (02/2017 "several"; 06/26/2017 X 1), Shortness of breath dyspnea, and Stroke ([place]) (02/13/2018).  Significant Hospital Events   5/19 admit 5/20 facial twitching history of seizures 5/21: No further seizure activity appreciated, remains on low-dose norepinephrine 5/22: Continues to wean norepinephrine cultures still pending Consults:    Procedures:    Significant Diagnostic Tests:  EEG 5/20 >> no seizure activity, intermittent right hemispheric sharp waves-cortical irritability as prior  Micro Data:  SARSCOV2 > Negative Urine 5/19 >> proteus, resistant to Cipro Blood 5/19 > Sputum 5/19: Moderate GPC RRR, few GNR, few GPC  Antimicrobials:  Zosyn ED Vancomycin 5/19 >>5/21 Unasyn 5/19 >  5/21 ceftx 5/21 >>  Interim history/subjective:  No significant issues overnight weaning norepinephrine Objective   Blood pressure (Abnormal) 121/56, pulse 71, temperature (Abnormal) 100.8 F (38.2 C), temperature source Oral, resp. rate 17, height 5\' 7"  (1.702 m), weight 75.8 kg, SpO2 99 %.    Vent Mode: PRVC FiO2 (%):  [40 %] 40 % Set Rate:  [16 bmp] 16 bmp Vt Set:  [490 mL] 490 mL PEEP:  [5 cmH20] 5 cmH20 Plateau Pressure:  [16 cmH20-26 cmH20] 24 cmH20   Intake/Output Summary (Last 24 hours) at 09/29/2018 1478 Last data filed at 09/29/2018 0700 Gross per 24 hour  Intake 2812.39 ml  Output 1790 ml  Net 1022.39 ml   Filed Weights   09/26/18 2335 09/28/18 0405 09/29/18 0500  Weight: 70.1 kg 75.2 kg 75.8 kg    Examination: General: Chronically ill demented thin/encephalopathic female remains on full ventilator support HEENT normocephalic atraumatic tracheostomy unremarkable scattered rhonchi appreciated on exam right chest tube dressing intact Cardiac: Regular rate and rhythm without murmur rub or gallop Abdomen soft not tender no organomegaly PEG tube unremarkable Extremities: Bilateral foot drop, generalized anasarca, warm and dry GU clear yellow Neuro: Minimally responsive  Resolved Hospital Problem list     Assessment & Plan:   Septic shock: Likely multiple potential sources including urinary tract infection with Proteus, also possible VAP and finally concern about PEG site cellulitis, and wounds -No significant temperature change, white blood cells improving -Pressor requirement: Plan Day #2 ceftriaxone, awaiting further sputum cultures Wean norepinephrine for mean arterial pressure greater than 60 or systolic blood pressure greater than 100 Continue Middaugh drain via tube Keep euvolemic Stress dose steroids:  Chronic hypoxemic respiratory failure/tracheostomy dependence CT imaging from the 20th: Diffuse groundglass nodular changes in the left upper lobe as  well as bilateral lower lobes, mediastinal lymphadenopathy right chest tube no pneumothorax Plan Continuing full ventilator support PAD protocol, RASS goal 0 to -1 VAP bundle X-ray in a.m. 5/23  Chronic encephalopathy after prior CVA Minimally responsive Plan Supportive care  R pleurex tube in place: reason unclear.  Plan Repeat chest x-ray 5/23 We will re-evaluate drainage regimen.  Wonder if she really needs this  AKI: marked elevation in BUN, minimal Creatinine elevation. Raises concern for dehydration, GI bleed.  Urine improved Plan Renal dose medications Strict intake and output Keeping euvolemic at this point  Fluid and electrolyte imbalance: Severe hypernatremia, hypophosphatemia,  hyperchloremia Plan Change KVO fluids to D5 water instead of saline Change free water to every 4 hours  Multiple pressure wounds Plan Wound care as outlined by wound ostomy nurse  History of AF: currently sinus. Off DOAC due to GIB concerns recently Chronic HFrEF:  Plan Continue telemetry monitoring   Oral thrush Plan Continue nystatin  Seizure history , recurrence 5/20 -loaded with dilantin 5/20 & ct maintenance dose Plan Continue current regimen of valproic acid and Keppra  Anemia -No evidence of bleeding  Plan Trending CBC Transfuse for hemoglobin less than 7   Best practice:  Diet: Tube feeds Pain/Anxiety/Delirium protocol (if indicated): scheduled vicodin VAP protocol (if indicated): Per protocol DVT prophylaxis: Lovenox GI prophylaxis: protonix Glucose control:  Mobility: BR Code Status: FULL Family Communication: daughter Disposition: ICU: Remains critically ill.  Weaning pressors.  Unfortunately I think this is just again another repeated round of sepsis.  She should come off norepinephrine either today or tomorrow we are awaiting further culture data.  Unfortunately success will be back to [facility] and then probably back once again to call in when she becomes  infected again   09/29/2018  Erick Colace, NP   Erick Colace ACNP-BC [place] Pager # [phone] OR # [phone] if no answer   Independently examined pt, evaluated data & formulated above care plan with NP  Current issues- Septic shock-Levophed is being tapered, continue midodrine 5 3 times daily and this can be tapered off over the next 1 to 2 days.  Source seems to be a Proteus UTI for which she is on ceftriaxone.  She had Acinetobacter in her sputum in the past and respiratory culture is still awaited  Chronic respiratory failure/vent dependent-has been unable to wean She has a Pleurx catheter on right for unclear reason, this can be discontinued  Seizure disorder with breakthrough on valproate and Keppra 5/20-Dilantin has been added and seizures have not recurred  Hypophosphatemia will be repleted, add free water for hypernatremia She has severe protein calorie malnutrition  Daughter has been resistant to palliative conversations in the past.  Our goal is for her to go back to [facility] if she remains off Levophed next 24 hours  My independent cc time x 34m  Rakesh V. Elsworth Soho MD

## 2018-09-30 ENCOUNTER — Inpatient Hospital Stay (HOSPITAL_COMMUNITY): Payer: Medicare Other

## 2018-09-30 DIAGNOSIS — Z9911 Dependence on respirator [ventilator] status: Secondary | ICD-10-CM | POA: Diagnosis not present

## 2018-09-30 DIAGNOSIS — A419 Sepsis, unspecified organism: Secondary | ICD-10-CM | POA: Diagnosis not present

## 2018-09-30 DIAGNOSIS — N179 Acute kidney failure, unspecified: Secondary | ICD-10-CM | POA: Diagnosis not present

## 2018-09-30 DIAGNOSIS — R6521 Severe sepsis with septic shock: Secondary | ICD-10-CM | POA: Diagnosis not present

## 2018-09-30 DIAGNOSIS — J90 Pleural effusion, not elsewhere classified: Secondary | ICD-10-CM | POA: Diagnosis not present

## 2018-09-30 DIAGNOSIS — Z20828 Contact with and (suspected) exposure to other viral communicable diseases: Secondary | ICD-10-CM | POA: Diagnosis not present

## 2018-09-30 DIAGNOSIS — J189 Pneumonia, unspecified organism: Secondary | ICD-10-CM | POA: Diagnosis not present

## 2018-09-30 DIAGNOSIS — J151 Pneumonia due to Pseudomonas: Secondary | ICD-10-CM | POA: Diagnosis not present

## 2018-09-30 DIAGNOSIS — J9610 Chronic respiratory failure, unspecified whether with hypoxia or hypercapnia: Secondary | ICD-10-CM | POA: Diagnosis not present

## 2018-09-30 DIAGNOSIS — J9621 Acute and chronic respiratory failure with hypoxia: Secondary | ICD-10-CM | POA: Diagnosis not present

## 2018-09-30 LAB — COMPREHENSIVE METABOLIC PANEL
ALT: 21 U/L (ref 0–44)
AST: 21 U/L (ref 15–41)
Albumin: 1.5 g/dL — ABNORMAL LOW (ref 3.5–5.0)
Alkaline Phosphatase: 73 U/L (ref 38–126)
Anion gap: 9 (ref 5–15)
BUN: 30 mg/dL — ABNORMAL HIGH (ref 8–23)
CO2: 25 mmol/L (ref 22–32)
Calcium: 8.6 mg/dL — ABNORMAL LOW (ref 8.9–10.3)
Chloride: 112 mmol/L — ABNORMAL HIGH (ref 98–111)
Creatinine, Ser: 0.51 mg/dL (ref 0.44–1.00)
Glucose, Bld: 95 mg/dL (ref 70–99)
Potassium: 4.6 mmol/L (ref 3.5–5.1)
Sodium: 146 mmol/L — ABNORMAL HIGH (ref 135–145)
Total Bilirubin: 0.2 mg/dL — ABNORMAL LOW (ref 0.3–1.2)
Total Protein: 5.6 g/dL — ABNORMAL LOW (ref 6.5–8.1)

## 2018-09-30 LAB — CBC
HCT: 26.5 % — ABNORMAL LOW (ref 36.0–46.0)
Hemoglobin: 7.1 g/dL — ABNORMAL LOW (ref 12.0–15.0)
MCH: 26 pg (ref 26.0–34.0)
MCHC: 26.8 g/dL — ABNORMAL LOW (ref 30.0–36.0)
MCV: 97.1 fL (ref 80.0–100.0)
Platelets: 135 10*3/uL — ABNORMAL LOW (ref 150–400)
RBC: 2.73 MIL/uL — ABNORMAL LOW (ref 3.87–5.11)
RDW: 19.7 % — ABNORMAL HIGH (ref 11.5–15.5)
WBC: 9.7 10*3/uL (ref 4.0–10.5)
nRBC: 0.4 % — ABNORMAL HIGH (ref 0.0–0.2)

## 2018-09-30 LAB — CULTURE, RESPIRATORY W GRAM STAIN

## 2018-09-30 LAB — GLUCOSE, CAPILLARY
Glucose-Capillary: 118 mg/dL — ABNORMAL HIGH (ref 70–99)
Glucose-Capillary: 94 mg/dL (ref 70–99)
Glucose-Capillary: 95 mg/dL (ref 70–99)
Glucose-Capillary: 90 mg/dL (ref 70–99)
Glucose-Capillary: 73 mg/dL (ref 70–99)
Glucose-Capillary: 88 mg/dL (ref 70–99)
Glucose-Capillary: 102 mg/dL — ABNORMAL HIGH (ref 70–99)

## 2018-09-30 MED ORDER — FUROSEMIDE 10 MG/ML IJ SOLN
40.0000 mg | Freq: Once | INTRAMUSCULAR | Status: AC
  Administered 2018-09-30: 40 mg via INTRAVENOUS
  Filled 2018-09-30: qty 4

## 2018-09-30 NOTE — Progress Notes (Signed)
[phone]  NAME:  Melissa Cochran, MRN:  [id number], DOB:  01/05/1936, LOS: 4 ADMISSION DATE:  09/26/2018, CONSULTATION DATE:  5/20 REFERRING MD:  Dr, Thayer Headings, CHIEF COMPLAINT:  Sepsis  Brief History   83 year old female with chronic vent need who resides at [facility]. Recent admissions for sepsis 2/2 acinetobacter PNA. Admitted 5/19 for sepsis. Started on pressors.   Since her last admission she has had R sided chest tube placed, for reasons that are unclear.   Family has been involved in many palliative care discussions and has been steadfast in their desire for aggressive care.   Past Medical History   has a past medical history of Adnexal mass, Anxiety, Arthritis, Bladder tumor, Bleeding ulcer, CHF (congestive heart failure) ([place]), CHF (congestive heart failure) ([place]), Chronic bronchitis ([place]), Chronic respiratory failure ([place]) (04/19/2018), COPD (chronic obstructive pulmonary disease) ([place]), Coronary artery disease, CVA (cerebral vascular accident) ([place]) (04/19/2018), DVT (deep venous thrombosis) ([place]), GERD (gastroesophageal reflux disease), Heart block, History of bleeding ulcers, History of kidney stones, Hyperlipemia, Hypertension, Myocardial infarction ([place]), Pneumonia, Seizures ([place]) (02/2017 "several"; 06/26/2017 X 1), Shortness of breath dyspnea, and Stroke ([place]) (02/13/2018).  Significant Hospital Events   5/19 admit 5/20 facial twitching history of seizures 5/21: No further seizure activity appreciated, remains on low-dose norepinephrine 5/22: Continues to wean norepinephrine cultures still pending Consults:    Procedures:    Significant Diagnostic Tests:  EEG 5/20 >> no seizure activity, intermittent right hemispheric sharp waves-cortical irritability as prior  Micro Data:  SARSCOV2 > Negative Urine 5/19 >> proteus, resistant to Cipro Blood 5/19 > Sputum 5/19: Moderate GPC RRR, few GNR, few GPC  Antimicrobials:  Zosyn ED Vancomycin 5/19 >>5/21 Unasyn 5/19 >  5/21 ceftx 5/21 >>  Interim history/subjective:  NAEO Off pressors now but BP remains borderline   Objective   Blood pressure (!) 110/51, pulse 92, temperature 99.4 F (37.4 C), temperature source Axillary, resp. rate (!) 25, height 5\' 7"  (1.702 m), weight 76.6 kg, SpO2 98 %.    Vent Mode: PRVC FiO2 (%):  [40 %] 40 % Set Rate:  [16 bmp] 16 bmp Vt Set:  [490 mL] 490 mL PEEP:  [5 cmH20] 5 cmH20 Plateau Pressure:  [24 cmH20-33 cmH20] 27 cmH20   Intake/Output Summary (Last 24 hours) at 09/30/2018 0858 Last data filed at 09/30/2018 0800 Gross per 24 hour  Intake 2662.53 ml  Output 1710 ml  Net 952.53 ml   Filed Weights   09/28/18 0405 09/29/18 0500 09/30/18 0119  Weight: 75.2 kg 75.8 kg 76.6 kg    Examination: General: Chronically ill elderly female, vent dependent, minimally responsive NAD HEENT NCAT trachea midline, trach secure pink mmm anicteric sclera Respiratory: Bilateral rhonchi. Symmetrical chest excursion.  Cardiac: Regular rate and rhythm without murmur rub or gallop Abdomen soft not tender no organomegaly PEG tube unremarkable Extremities: Bilateral foot drop, generalized anasarca, warm and dry GU clear yellow Neuro: Minimally responsive  Resolved Hospital Problem list     Assessment & Plan:   Septic Shock -proteus UTI, possible VAP, possible cellulitis  Plan Day #3 ceftriaxone, awaiting further sputum cultures Continue Middaugh drain via tube Goal euvolemia MAP goal > 65. Is off NE now however pressure remains somewhat soft and anticipate may need to resume low-dose pressors in future vs albumin   Chronic hypoxemic respiratory failure /tracheostomy dependence CT imaging from the 20th: Diffuse groundglass nodular changes in the left upper lobe as well as bilateral lower lobes, mediastinal lymphadenopathy right chest tube no pneumothorax  CXR 5/23 with worsening patchy ASD. Bilateral pleural effusions. Plan Continue mechanical ventilatory support  PAD  protocol, RASS goal 0 to -1 VAP bundle  Chronic encephalopathy s/p prior CVA Minimally responsive Plan Supportive care  R pleurex tube in place: present on admission without clear etiology -CXR reveals bilateral pleural effusions 5/23 Plan Repeat chest x-ray 5/24 Clamp and evaluate for possible removal  Will diurese gently today  AKI: marked elevation in BUN, minimal Creatinine elevation. Raises concern for dehydration, GI bleed.  Urine improved Plan Renal dose medications Strict intake and output Goal euvolemia, will diurese today  Fluid and electrolyte imbalance: Severe hypernatremia, hypophosphatemia,  hyperchloremia Plan Change KVO fluids to D5 water instead of saline Change free water to every 4 hours  Multiple pressure wounds Plan Wound care as outlined by wound ostomy nurse  History of AF: currently sinus. Chronic HFrEF:  Plan Continue tele Goal euvolemia    Oral thrush Plan Continue nystatin  Seizure history , recurrence 5/20 -loaded with dilantin 5/20 & ct maintenance dose Plan Continue Dilantin, Depakote, Keppra   Anemia -No evidence of active bleed Plan Continue to trend CBC Transfuse if Hgb <7  Malnutrition P EN  Best practice:  Diet: Tube feeds Pain/Anxiety/Delirium protocol (if indicated): PRN vicodin VAP protocol (if indicated): Per protocol DVT prophylaxis: Lovenox GI prophylaxis: protonix Glucose control:  Mobility: BR Code Status: FULL Family Communication: daughter Disposition: ICU. Remains critically ill. Goal will be placement with [facility].    Critical Care Time 35 minutes  Eliseo Gum MSN, AGACNP-BC [place] [phone] If no answer, [phone] 09/30/2018, 8:58 AM

## 2018-09-30 NOTE — Progress Notes (Signed)
PCCM Brief Communication Note  Attempted to reach patient's daughter to provide daily updates, directed to voicemail.   Eliseo Gum MSN, AGACNP-BC [place] [phone] If no answer, [phone] 09/30/2018, 11:49 AM

## 2018-10-01 ENCOUNTER — Inpatient Hospital Stay (HOSPITAL_COMMUNITY): Payer: Medicare Other

## 2018-10-01 DIAGNOSIS — R6521 Severe sepsis with septic shock: Secondary | ICD-10-CM | POA: Diagnosis not present

## 2018-10-01 DIAGNOSIS — J9621 Acute and chronic respiratory failure with hypoxia: Secondary | ICD-10-CM | POA: Diagnosis not present

## 2018-10-01 DIAGNOSIS — Z20828 Contact with and (suspected) exposure to other viral communicable diseases: Secondary | ICD-10-CM | POA: Diagnosis not present

## 2018-10-01 DIAGNOSIS — Z9911 Dependence on respirator [ventilator] status: Secondary | ICD-10-CM | POA: Diagnosis not present

## 2018-10-01 DIAGNOSIS — J151 Pneumonia due to Pseudomonas: Secondary | ICD-10-CM | POA: Diagnosis not present

## 2018-10-01 DIAGNOSIS — J9610 Chronic respiratory failure, unspecified whether with hypoxia or hypercapnia: Secondary | ICD-10-CM | POA: Diagnosis not present

## 2018-10-01 DIAGNOSIS — N179 Acute kidney failure, unspecified: Secondary | ICD-10-CM | POA: Diagnosis not present

## 2018-10-01 DIAGNOSIS — A419 Sepsis, unspecified organism: Secondary | ICD-10-CM | POA: Diagnosis not present

## 2018-10-01 LAB — BASIC METABOLIC PANEL
Anion gap: 7 (ref 5–15)
BUN: 24 mg/dL — ABNORMAL HIGH (ref 8–23)
CO2: 24 mmol/L (ref 22–32)
Calcium: 8.1 mg/dL — ABNORMAL LOW (ref 8.9–10.3)
Chloride: 111 mmol/L (ref 98–111)
Creatinine, Ser: 0.36 mg/dL — ABNORMAL LOW (ref 0.44–1.00)
GFR calc Af Amer: >60 mL/min (ref >60)
GFR calc non Af Amer: >60 mL/min (ref >60)
Glucose, Bld: 86 mg/dL (ref 70–99)
Potassium: 3.8 mmol/L (ref 3.5–5.1)
Sodium: 142 mmol/L (ref 135–145)

## 2018-10-01 LAB — CBC
HCT: 26.8 % — ABNORMAL LOW (ref 36.0–46.0)
Hemoglobin: 7.4 g/dL — ABNORMAL LOW (ref 12.0–15.0)
MCH: 26.4 pg (ref 26.0–34.0)
MCHC: 27.6 g/dL — ABNORMAL LOW (ref 30.0–36.0)
MCV: 95.7 fL (ref 80.0–100.0)
Platelets: 231 10*3/uL (ref 150–400)
RBC: 2.8 MIL/uL — ABNORMAL LOW (ref 3.87–5.11)
RDW: 19.9 % — ABNORMAL HIGH (ref 11.5–15.5)
WBC: 9.1 10*3/uL (ref 4.0–10.5)
nRBC: 0.3 % — ABNORMAL HIGH (ref 0.0–0.2)

## 2018-10-01 LAB — CULTURE, BLOOD (ROUTINE X 2)
Culture: NO GROWTH
Culture: NO GROWTH
Special Requests: ADEQUATE

## 2018-10-01 LAB — PHOSPHORUS
Phosphorus: 1.9 mg/dL — ABNORMAL LOW (ref 2.5–4.6)
Phosphorus: 2.9 mg/dL (ref 2.5–4.6)

## 2018-10-01 LAB — MAGNESIUM: Magnesium: 2.2 mg/dL (ref 1.7–2.4)

## 2018-10-01 LAB — GLUCOSE, CAPILLARY
Glucose-Capillary: 121 mg/dL — ABNORMAL HIGH (ref 70–99)
Glucose-Capillary: 83 mg/dL (ref 70–99)
Glucose-Capillary: 85 mg/dL (ref 70–99)
Glucose-Capillary: 89 mg/dL (ref 70–99)
Glucose-Capillary: 90 mg/dL (ref 70–99)
Glucose-Capillary: 97 mg/dL (ref 70–99)

## 2018-10-01 MED ORDER — POTASSIUM PHOSPHATES 15 MMOLE/5ML IV SOLN
30.0000 mmol | Freq: Once | INTRAVENOUS | Status: AC
  Administered 2018-10-01: 30 mmol via INTRAVENOUS
  Filled 2018-10-01: qty 10

## 2018-10-01 MED ORDER — FUROSEMIDE 10 MG/ML IJ SOLN
40.0000 mg | Freq: Once | INTRAMUSCULAR | Status: AC
  Administered 2018-10-01: 40 mg via INTRAVENOUS
  Filled 2018-10-01: qty 4

## 2018-10-01 NOTE — Progress Notes (Addendum)
[phone]  NAME:  Melissa Cochran, MRN:  [id number], DOB:  02/01/1936, LOS: 5 ADMISSION DATE:  09/26/2018, CONSULTATION DATE:  5/20 REFERRING MD:  Dr, Thayer Headings, CHIEF COMPLAINT:  Sepsis  Brief History   83 year old female with chronic vent need who resides at [facility]. Recent admissions for sepsis 2/2 acinetobacter PNA. Admitted 5/19 for sepsis. Started on pressors.   Since her last admission she has had R sided chest tube placed, for reasons that are unclear.   Family has been involved in many palliative care discussions and has been steadfast in their desire for aggressive care.   Past Medical History   has a past medical history of Adnexal mass, Anxiety, Arthritis, Bladder tumor, Bleeding ulcer, CHF (congestive heart failure) ([place]), CHF (congestive heart failure) ([place]), Chronic bronchitis ([place]), Chronic respiratory failure ([place]) (04/19/2018), COPD (chronic obstructive pulmonary disease) ([place]), Coronary artery disease, CVA (cerebral vascular accident) ([place]) (04/19/2018), DVT (deep venous thrombosis) ([place]), GERD (gastroesophageal reflux disease), Heart block, History of bleeding ulcers, History of kidney stones, Hyperlipemia, Hypertension, Myocardial infarction ([place]), Pneumonia, Seizures ([place]) (02/2017 "several"; 06/26/2017 X 1), Shortness of breath dyspnea, and Stroke ([place]) (02/13/2018).  Significant Hospital Events   5/19 admit 5/20 facial twitching history of seizures 5/21: No further seizure activity appreciated, remains on low-dose norepinephrine 5/22: Continues to wean norepinephrine cultures still pending Consults:    Procedures:    Significant Diagnostic Tests:  EEG 5/20 >> no seizure activity, intermittent right hemispheric sharp waves-cortical irritability as prior  Micro Data:  SARSCOV2 > Negative Urine 5/19 >> proteus, resistant to Cipro Blood 5/19 >  Neg  Sputum 5/20 : Moderate GPC RRR, few GNR, few GPC> few pseudomonas sensitive to copro  Antimicrobials:  Zosyn  ED Vancomycin 5/19 >>5/21 Unasyn 5/19 > 5/21 ceftx 5/21 >>  Interim history/subjective:  NAEO Remains off pressors Blood pressure soft today  Objective   Blood pressure 127/69, pulse 93, temperature 99.5 F (37.5 C), temperature source Axillary, resp. rate (!) 24, height 5\' 7"  (1.702 m), weight 76.5 kg, SpO2 97 %.    Vent Mode: PRVC FiO2 (%):  [40 %] 40 % Set Rate:  [16 bmp] 16 bmp Vt Set:  [490 mL] 490 mL PEEP:  [5 cmH20] 5 cmH20 Plateau Pressure:  [20 cmH20-28 cmH20] 28 cmH20   Intake/Output Summary (Last 24 hours) at 10/01/2018 1059 Last data filed at 10/01/2018 0600 Gross per 24 hour  Intake 1949.6 ml  Output 2375 ml  Net -425.4 ml   Filed Weights   09/29/18 0500 09/30/18 0119 10/01/18 0116  Weight: 75.8 kg 76.6 kg 76.5 kg        Examination: General: Chronically ill elderly female, trach vent.  HEENT: NCAT trachea midline, tacky pink mucus membranes, trachea midline Respiratory: Symmetrical chest excursion, Diffuse rhonchi, no accessory muscle recruitment  Cardiac: IRIR s1s2 no JVD 1+ pulses  Abdomen: soft round ndnt, normoactive x4  Extremities: BLE edema, no clubbing, no cyanosis  GU: yellow, clear  Neuro: Minimally responsive.   Resolved Hospital Problem list   Hypernatremia and hyperchloremia  AKI  Assessment & Plan:   Septic Shock, improving shock  -proteus UTI, possible VAP, possible cellulitis  Plan Continue ceftriaxone unless sputum gets more purulent in which case change to maxepime  Goal euvolemia MAP gaol > 65. Off pressors currently   Chronic hypoxemic respiratory failure /tracheostomy dependence CT imaging from the 20th: Diffuse groundglass nodular changes in the left upper lobe as well as bilateral lower lobes, mediastinal lymphadenopathy right chest tube no  pneumothorax CXR 5/25 R > L airspace disease. Bilateral pleural effusions Plan Continue mechanical ventilatory support  PAD protocol, RASS goal 0 to -1 VAP bundle AM CXR CPT   Chronic encephalopathy s/p prior CVA Minimally responsive Plan Supportive care  R pleurex tube in place: present on admission without clear etiology Plan Clamp and evaluate for possible removal  AM CXR   Fluid and electrolyte imbalance: Hypernatremia and hyperchloremia improved Ongoing hypophosphatemia Plan Dc d5 Monitor BMP mag phos replace PRN Replacing phos today Will repeat diuresis today   Multiple pressure wounds Plan Wound care as outlined by wound ostomy nurse  History of AF: currently sinus. Chronic HFrEF:  Plan Continue tele Goal euvolemia    Oral thrush Plan Continue nystatin  Seizure history -recurrence of seizure on 5/20  Plan Continue Dilantin, Depakote, Keppra   Anemia -No evidence of active bleed Plan Continue to trend CBC Transfuse if Hgb <7  Malnutrition P EN via PEG   Best practice:  Diet: Tube feeds Pain/Anxiety/Delirium protocol (if indicated): PRN vicodin VAP protocol (if indicated): Per protocol DVT prophylaxis: Lovenox GI prophylaxis: protonix Glucose control: SSI Mobility: BR Code Status: FULL Family Communication: pending  Disposition: ICU. Goal to place with [place] Time 35 minutes  Eliseo Gum MSN, AGACNP-BC [place] [phone] If no answer, [phone] 10/01/2018, 10:59 AM    I, Dr. Shellee Milo, have personally reviewed patient's available data, including medical history, events of note, physical examination and test results as part of my evaluation. I have discussed with NP Eliseo Gum   and other care providers all aspects of the patients PCCM issues as listed.  In addition,  I have personally evaluated the patient and assisted in the formulation of the management plan.   Her et sample from 5/20 growing just a few pseudomas but if spike on CTX or sputum gets more purulent rec change to maxepime   Should be ready for tx back to [facility] 5/25 or 5/26 depending on  [facility] day lmitations   Christinia Gully, MD Pulmonary and [place] [phone] After 5:30 PM or weekends, use [facility] [phone]

## 2018-10-01 NOTE — Progress Notes (Signed)
Video call completed with daughter. Pt did not open her eyes and was unresponsive.

## 2018-10-02 ENCOUNTER — Inpatient Hospital Stay (HOSPITAL_COMMUNITY): Payer: Medicare Other

## 2018-10-02 DIAGNOSIS — N179 Acute kidney failure, unspecified: Secondary | ICD-10-CM | POA: Diagnosis not present

## 2018-10-02 DIAGNOSIS — J9621 Acute and chronic respiratory failure with hypoxia: Secondary | ICD-10-CM | POA: Diagnosis not present

## 2018-10-02 DIAGNOSIS — J151 Pneumonia due to Pseudomonas: Secondary | ICD-10-CM | POA: Diagnosis not present

## 2018-10-02 DIAGNOSIS — Z20828 Contact with and (suspected) exposure to other viral communicable diseases: Secondary | ICD-10-CM | POA: Diagnosis not present

## 2018-10-02 DIAGNOSIS — J9610 Chronic respiratory failure, unspecified whether with hypoxia or hypercapnia: Secondary | ICD-10-CM | POA: Diagnosis not present

## 2018-10-02 DIAGNOSIS — Z9911 Dependence on respirator [ventilator] status: Secondary | ICD-10-CM | POA: Diagnosis not present

## 2018-10-02 DIAGNOSIS — R6521 Severe sepsis with septic shock: Secondary | ICD-10-CM | POA: Diagnosis not present

## 2018-10-02 DIAGNOSIS — J9611 Chronic respiratory failure with hypoxia: Secondary | ICD-10-CM | POA: Diagnosis not present

## 2018-10-02 DIAGNOSIS — A419 Sepsis, unspecified organism: Secondary | ICD-10-CM | POA: Diagnosis not present

## 2018-10-02 LAB — BASIC METABOLIC PANEL
Anion gap: 8 (ref 5–15)
BUN: 19 mg/dL (ref 8–23)
CO2: 25 mmol/L (ref 22–32)
Calcium: 8.5 mg/dL — ABNORMAL LOW (ref 8.9–10.3)
Chloride: 105 mmol/L (ref 98–111)
Creatinine, Ser: 0.32 mg/dL — ABNORMAL LOW (ref 0.44–1.00)
GFR calc Af Amer: >60 mL/min (ref >60)
GFR calc non Af Amer: >60 mL/min (ref >60)
Glucose, Bld: 91 mg/dL (ref 70–99)
Potassium: 4.4 mmol/L (ref 3.5–5.1)
Sodium: 138 mmol/L (ref 135–145)

## 2018-10-02 LAB — MAGNESIUM: Magnesium: 2.2 mg/dL (ref 1.7–2.4)

## 2018-10-02 LAB — GLUCOSE, CAPILLARY
Glucose-Capillary: 130 mg/dL — ABNORMAL HIGH (ref 70–99)
Glucose-Capillary: 74 mg/dL (ref 70–99)
Glucose-Capillary: 77 mg/dL (ref 70–99)
Glucose-Capillary: 78 mg/dL (ref 70–99)
Glucose-Capillary: 79 mg/dL (ref 70–99)
Glucose-Capillary: 82 mg/dL (ref 70–99)
Glucose-Capillary: 97 mg/dL (ref 70–99)

## 2018-10-02 LAB — PHENYTOIN LEVEL, TOTAL: Phenytoin Lvl: 2.9 ug/mL — ABNORMAL LOW (ref 10.0–20.0)

## 2018-10-02 MED ORDER — PHENYTOIN SODIUM 50 MG/ML IJ SOLN
100.0000 mg | Freq: Two times a day (BID) | INTRAMUSCULAR | Status: DC
  Administered 2018-10-02 – 2018-10-05 (×7): 100 mg via INTRAVENOUS
  Filled 2018-10-02 (×7): qty 2

## 2018-10-02 MED ORDER — SODIUM CHLORIDE 0.9 % IV SOLN
150.0000 mg | Freq: Every day | INTRAVENOUS | Status: DC
  Administered 2018-10-02 – 2018-10-04 (×3): 150 mg via INTRAVENOUS
  Filled 2018-10-02 (×5): qty 3

## 2018-10-02 MED ORDER — PANCRELIPASE (LIP-PROT-AMYL) 10440-39150 UNITS PO TABS
20880.0000 [IU] | ORAL_TABLET | Freq: Once | ORAL | Status: DC
  Filled 2018-10-02: qty 2

## 2018-10-02 MED ORDER — SODIUM BICARBONATE 650 MG PO TABS
650.0000 mg | ORAL_TABLET | Freq: Once | ORAL | Status: DC
  Filled 2018-10-02 (×2): qty 1

## 2018-10-02 NOTE — Progress Notes (Addendum)
Order to drain pleurX cath Rt chest area. Tried to drain using PleurX drainage kit - no drainage and nothing obtained to be able to send to lab for culture - Dr Elsworth Soho aware Catheter redressed with dressing from kit and dated

## 2018-10-02 NOTE — Progress Notes (Signed)
eLink Physician-Brief Progress Note Patient Name: Melissa Cochran DOB: May 10, 1936 MRN: [id number]   Date of Service  10/02/2018  HPI/Events of Note  History of seizure disorder - Dilantin level this AM = 2.9.   eICU Interventions  Will order: 1. Pharmacy consult to help with Dilantin pharmocokinetic modeling and dosing.      Intervention Category Major Interventions: Other:  Lysle Dingwall 10/02/2018, 5:34 AM

## 2018-10-02 NOTE — Progress Notes (Addendum)
[phone]  NAME:  Melissa Cochran, MRN:  [id number], DOB:  01/13/1936, LOS: 6 ADMISSION DATE:  09/26/2018, CONSULTATION DATE:  5/20 REFERRING MD:  Dr, Thayer Headings, CHIEF COMPLAINT:  Sepsis  Brief History   83 year old female with chronic vent need who resides at [facility]. Recent admissions for sepsis 2/2 acinetobacter PNA. Admitted 5/19 for sepsis. Started on pressors.   Since her last admission she has had R sided chest tube placed, for reasons that are unclear.   Family has been involved in many palliative care discussions and has been steadfast in their desire for aggressive care.   Past Medical History   has a past medical history of Adnexal mass, Anxiety, Arthritis, Bladder tumor, Bleeding ulcer, CHF (congestive heart failure) ([place]), CHF (congestive heart failure) ([place]), Chronic bronchitis ([address] [place]), Chronic respiratory failure ([place]) (04/19/2018), COPD (chronic obstructive pulmonary disease) ([place]), Coronary artery disease, CVA (cerebral vascular accident) ([place]) (04/19/2018), DVT (deep venous thrombosis) ([place]), GERD (gastroesophageal reflux disease), Heart block, History of bleeding ulcers, History of kidney stones, Hyperlipemia, Hypertension, Myocardial infarction ([place]), Pneumonia, Seizures ([place]) (02/2017 "several"; 06/26/2017 X 1), Shortness of breath dyspnea, and Stroke ([place]) (02/13/2018).  Significant Hospital Events   5/19 admit 5/20 facial twitching history of seizures 5/21: No further seizure activity appreciated, remains on low-dose norepinephrine 5/22: Continues to wean norepinephrine cultures still pending 5/24: With off pressors. 5/25: No significant changes.  Only issue at this point is clogged feeding tube.  Working on getting this working again.  Draining Pleurx catheter, sending this for culture.  Otherwise ready for transfer back to [facility] Consults:    Procedures:    Significant Diagnostic Tests:  EEG 5/20 >> no seizure activity, intermittent right hemispheric sharp  waves-cortical irritability as prior  Micro Data:  SARSCOV2 > Negative Urine 5/19 >> proteus, resistant to Cipro Blood 5/19 >  Neg  Sputum 5/20 : Moderate GPC RRR, few GNR, few GPC> few pseudomonas sensitive to cipro  Antimicrobials:  Zosyn ED Vancomycin 5/19 >>5/21 Unasyn 5/19 > 5/21 ceftx 5/21 >>  Interim history/subjective:  No significant changes.  Remains off pressors, PEG tube clogged this morning  Objective   Blood pressure (Abnormal) 108/51, pulse 94, temperature 99.3 F (37.4 C), temperature source Oral, resp. rate 17, height 5\' 7"  (1.702 m), weight 76.5 kg, SpO2 97 %.    Vent Mode: PRVC FiO2 (%):  [40 %] 40 % Set Rate:  [16 bmp] 16 bmp Vt Set:  [490 mL] 490 mL PEEP:  [5 cmH20] 5 cmH20 Plateau Pressure:  [23 cmH20-29 cmH20] 23 cmH20   Intake/Output Summary (Last 24 hours) at 10/02/2018 0913 Last data filed at 10/02/2018 0600 Gross per 24 hour  Intake 2408.47 ml  Output 3075 ml  Net -666.53 ml   Filed Weights   09/30/18 0119 10/01/18 0116 10/02/18 0328  Weight: 76.6 kg 76.5 kg 76.5 kg        Examination:  General: This is a chronically ill-appearing ventilator dependent 83 year old female who remains minimally responsive on mechanical ventilation HEENT orally intubated no JVD Pulmonary: Coarse scattered rhonchi mechanically assisted breath Right Pleurx cath dressing intact Cardiac: Regular rate and rhythm Abdomen: Soft nontender, does have liquid stools Extremities: Warm and dry dependent edema brisk capillary refill Neuro: Grimaces to noxious stimulus only GU: Clear yellow  Resolved Hospital Problem list   Hypernatremia and hyperchloremia  AKI  Sepsis Septic shock Assessment & Plan:    -proteus UTI, possible VAP, possible cellulitis -Grew Pseudomonas from sputum however improved clinically with ceftriaxone so  suspect Pseudomonas is a colonizer Plan Day #5 of 7 ceftriaxone Trend CBC and fever curve  Chronic hypoxemic respiratory failure  /tracheostomy dependence Portable chest x-ray personally reviewed: Audry Pili ostomy is in satisfactory position.  The right chest tube is noted towards the apex of the chest worsening aeration appreciated on the right side today when comparing serial films does have bilateral airspace disease Plan Continuing full ventilator support VAP bundle A.m. chest x-ray  Chronic encephalopathy s/p prior CVA Minimally responsive Plan Continue supportive care  R pleurex tube in place: present on admission without clear etiology Plan Drain Pleurx today Send sample of pleural fluid for culture  Intermittent fluid and electrolyte imbalance: Plan Continuing Lasix Free water via tube PRN  Multiple pressure wounds Plan Continue wound care as outlined by wound ostomy team  History of AF: currently sinus. Chronic HFrEF:  Plan Continue telemetry monitoring Keep euvolemic   Oral thrush Plan Continue nystatin  Seizure history -recurrence of seizure on 5/20  Plan Continue Dilantin, Depakote and Keppra   Anemia -No evidence of active bleed Plan Trend CBC  Malnutrition P Continue tube feeds  Best practice:  Diet: Tube feeds Pain/Anxiety/Delirium protocol (if indicated): PRN vicodin VAP protocol (if indicated): Per protocol DVT prophylaxis: Lovenox GI prophylaxis: protonix Glucose control: SSI Mobility: BR Code Status: FULL Family Communication: pending  Disposition: She is clinically stable.  She can go back to [facility], would complete 2 more days of antibiotics.  We will go ahead and send pleural culture and drain chest today, [facility] place this, will defer to them in regards to management in the future I am not sure she really needs to keep the chest tube  Erick Colace ACNP-BC [place] Pager # [phone] OR # [phone] if no answer

## 2018-10-02 NOTE — Progress Notes (Signed)
The patient TAMEIA RAFFERTY has current phenytoin level of  Phenytoin Lvl  Date Value Ref Range Status  10/02/2018 2.9 (L) 10.0 - 20.0 ug/mL Final    Comment:    Performed at [facility], [place] [address], [place], [place] [address]    Other pertinent lab values include: No components found for: LABALBU Creatinine  Date Value Ref Range Status  01/23/2014 0.72 0.60 - 1.30 mg/dL Final   Creatinine, Ser  Date Value Ref Range Status  10/02/2018 0.32 (L) 0.44 - 1.00 mg/dL Final   Received fosphenytoin load on 5/20. Started on phenytoin 100 mg IV every 8 hours on 5/21. Not on prior to admission- had seizure recurrence on 5/20. Last albumin level on 5/23 came back at 1.5. Corrected phenytoin level is 7.25 mg/L.   Will increase dose by 50 mg/day so new regimen every 8 hours with 150 mg in PM and 100 mg doses the remainder of the day.   Antonietta Jewel, PharmD, [place] Clinical Pharmacist  Pager: [phone] Phone: [phone]

## 2018-10-02 NOTE — Progress Notes (Signed)
CSW received phone call from [facility] stating that the patient is ready to discharge back to [facility]. CSW called and left a message with Erline Levine regarding that the patient is medically ready for discharge and asked if they have a bed available.   CSW is awaiting a return phone call. CSW will continue to follow and assist with discharge.   Domenic Schwab, MSW, [place]

## 2018-10-03 DIAGNOSIS — J189 Pneumonia, unspecified organism: Secondary | ICD-10-CM

## 2018-10-03 DIAGNOSIS — R6521 Severe sepsis with septic shock: Secondary | ICD-10-CM | POA: Diagnosis not present

## 2018-10-03 DIAGNOSIS — J151 Pneumonia due to Pseudomonas: Secondary | ICD-10-CM | POA: Diagnosis not present

## 2018-10-03 DIAGNOSIS — J9621 Acute and chronic respiratory failure with hypoxia: Secondary | ICD-10-CM | POA: Diagnosis not present

## 2018-10-03 DIAGNOSIS — Z9911 Dependence on respirator [ventilator] status: Secondary | ICD-10-CM | POA: Diagnosis not present

## 2018-10-03 DIAGNOSIS — A419 Sepsis, unspecified organism: Secondary | ICD-10-CM | POA: Diagnosis not present

## 2018-10-03 DIAGNOSIS — Z20828 Contact with and (suspected) exposure to other viral communicable diseases: Secondary | ICD-10-CM | POA: Diagnosis not present

## 2018-10-03 DIAGNOSIS — N179 Acute kidney failure, unspecified: Secondary | ICD-10-CM | POA: Diagnosis not present

## 2018-10-03 LAB — GLUCOSE, CAPILLARY
Glucose-Capillary: 75 mg/dL (ref 70–99)
Glucose-Capillary: 75 mg/dL (ref 70–99)
Glucose-Capillary: 96 mg/dL (ref 70–99)
Glucose-Capillary: 82 mg/dL (ref 70–99)
Glucose-Capillary: 99 mg/dL (ref 70–99)

## 2018-10-03 MED ORDER — LIDOCAINE-EPINEPHRINE 1 %-1:100000 IJ SOLN
10.0000 mL | Freq: Once | INTRAMUSCULAR | Status: AC
  Administered 2018-10-03: 11:00:00 10 mL via INTRADERMAL
  Filled 2018-10-03: qty 10

## 2018-10-03 MED ORDER — SODIUM CHLORIDE 0.9 % IV SOLN
2.0000 g | Freq: Three times a day (TID) | INTRAVENOUS | Status: DC
  Administered 2018-10-03 – 2018-10-05 (×7): 2 g via INTRAVENOUS
  Filled 2018-10-03 (×9): qty 2

## 2018-10-03 NOTE — Progress Notes (Signed)
[phone]  NAME:  Melissa Cochran, MRN:  [id number], DOB:  [birth year], LOS: 7 ADMISSION DATE:  09/26/2018, CONSULTATION DATE:  5/20 REFERRING MD:  Dr, Thayer Headings, CHIEF COMPLAINT:  Sepsis  Brief History   83 year old female with chronic vent need who resides at [facility]. Recent admissions for sepsis 2/2 acinetobacter PNA. Admitted 5/19 for sepsis. Started on pressors.   Since her last admission she has had R sided chest tube placed, for reasons that are unclear.   Family has been involved in many palliative care discussions and has been steadfast in their desire for aggressive care.   Past Medical History   has a past medical history of Adnexal mass, Anxiety, Arthritis, Bladder tumor, Bleeding ulcer, CHF (congestive heart failure) ([place]), CHF (congestive heart failure) ([place]), Chronic bronchitis ([place]), Chronic respiratory failure ([place]) (04/19/2018), COPD (chronic obstructive pulmonary disease) ([place]), Coronary artery disease, CVA (cerebral vascular accident) ([place]) (04/19/2018), DVT (deep venous thrombosis) ([place]), GERD (gastroesophageal reflux disease), Heart block, History of bleeding ulcers, History of kidney stones, Hyperlipemia, Hypertension, Myocardial infarction ([place]), Pneumonia, Seizures ([place]) (02/2017 "several"; 06/26/2017 X 1), Shortness of breath dyspnea, and Stroke ([place]) (02/13/2018).  Significant Hospital Events   5/19 admit 5/20 facial twitching history of seizures 5/21: No further seizure activity appreciated, remains on low-dose norepinephrine 5/22: Continues to wean norepinephrine cultures still pending 5/24: With off pressors. 5/25: No significant changes.  Only issue at this point is clogged feeding tube.  Working on getting this working again.  Draining Pleurx catheter, sending this for culture.  Otherwise ready for transfer back to [facility] 5/26: No fluid drained from Pleurx.  Chest x-ray looks worse but primarily consolidation/airspace disease on the right.  Spiking fever.   Changing antibiotics to cover for Pseudomonas now given positive culture. Consults:    Procedures:    Significant Diagnostic Tests:  EEG 5/20 >> no seizure activity, intermittent right hemispheric sharp waves-cortical irritability as prior  Micro Data:  SARSCOV2 > Negative Urine 5/19 >> proteus, resistant to Cipro Blood 5/19 >  Neg  Sputum 5/20 : Moderate GPC RRR, few GNR, few GPC> few pseudomonas sensitive to cipro  Antimicrobials:  Zosyn ED Vancomycin 5/19 >>5/21 Unasyn 5/19 > 5/21 ceftx 5/21 >> 5/26 Ceftaz edema 5/26  Interim history/subjective:  Spiking fever  Objective   Blood pressure (Abnormal) 111/55, pulse (Abnormal) 101, temperature (Abnormal) 101.3 F (38.5 C), temperature source Oral, resp. rate (Abnormal) 22, height 5\' 7"  (1.702 m), weight 78.6 kg, SpO2 98 %.    Vent Mode: PRVC FiO2 (%):  [40 %] 40 % Set Rate:  [16 bmp] 16 bmp Vt Set:  [490 mL] 490 mL PEEP:  [5 cmH20] 5 cmH20 Plateau Pressure:  [24 cmH20-30 cmH20] 26 cmH20   Intake/Output Summary (Last 24 hours) at 10/03/2018 0805 Last data filed at 10/03/2018 0600 Gross per 24 hour  Intake 2477.71 ml  Output 1400 ml  Net 1077.71 ml   Filed Weights   10/01/18 0116 10/02/18 0328 10/03/18 0500  Weight: 76.5 kg 76.5 kg 78.6 kg        Examination:  General: This is a chronically ill-appearing ventilator dependent 83 year old female who remains minimally responsive on mechanical ventilation HEENT orally intubated no JVD Pulmonary: Coarse scattered rhonchi mechanically assisted breath Right Pleurx cath dressing intact Cardiac: Regular rate and rhythm Abdomen: Soft nontender, does have liquid stools Extremities: Warm and dry dependent edema brisk capillary refill Neuro: Grimaces to noxious stimulus only GU: Clear yellow  Resolved Hospital Problem list   Hypernatremia and  hyperchloremia  AKI  Sepsis Septic shock Assessment & Plan:    -proteus UTI, possible VAP, possible cellulitis -Grew  Pseudomonas from sputum, initially felt like she was improving however chest x-ray does look a little worse and she spiked fever last night  plan Day 6 of 7 ceftriaxone; spiking fever. CXR looked worse on 5/26; we will go ahead and cover for the pseudomonas  We will repeat a CBC and chest x-ray in the morning if she still here Continue to trend fever curve   Chronic hypoxemic respiratory failure /tracheostomy dependence Portable chest x-ray personally reviewed: Audry Pili ostomy is in satisfactory position.  The right chest tube is noted towards the apex of the chest worsening aeration appreciated on the right side today when comparing serial films does have bilateral airspace disease Plan Continuing full ventilator support VAP bundle  Chronic encephalopathy s/p prior CVA Minimally responsive Plan Supportive care  R pleurex tube in place: present on admission without clear etiology, there is minimal pleural fluid on imaging and we were unable to drain Plan As the Pleurx is now not functional it simply an infection risk we will remove it later this morning  Intermittent fluid and electrolyte imbalance: Plan We will intermittently check electrolytes Continuing current free water replacement  Multiple pressure wounds Plan Wound care is outlined by ostomy nurse   History of AF: currently sinus. Chronic HFrEF:  Plan Maintain euvolemia, continuing telemetry monitoring   Oral thrush Plan Continue nystatin  Seizure history -recurrence of seizure on 5/20  Plan Continue Dilantin, Depakote and Keppra   Anemia -No evidence of active bleed Plan Intermittent CBC  Malnutrition P Continue tube feeds  Best practice:  Diet: Tube feeds Pain/Anxiety/Delirium protocol (if indicated): PRN vicodin VAP protocol (if indicated): Per protocol DVT prophylaxis: Lovenox GI prophylaxis: protonix Glucose control: SSI Mobility: BR Code Status: FULL Family Communication: pending   Disposition: Stable awaiting [facility], will go ahead and discontinue Pleurx catheter as it is only serving as a nidus for infection at this point also will change antibiotics to ceftazidime given fever spike and known positive culture for Pseudomonas and worsening chest x-ray from 5/27.  I still think she can go back to [place] ACNP-BC [place] Pager # [phone] OR # [phone] if no answer

## 2018-10-03 NOTE — Progress Notes (Signed)
Right pleur-x tube removed w/out difficulty. Occlusive dressing placed.   Erick Colace ACNP-BC [place] Pager # [phone] OR # [phone] if no answer

## 2018-10-03 NOTE — Progress Notes (Addendum)
Pharmacy Antibiotic Note  Melissa Cochran is a 83 y.o. female admitted on 09/26/2018 with pneumonia.  Pharmacy has been consulted for ceftazidime dosing. Pt was being treated with ceftriaxone for proteus UTI and was improving. 5/20 Trach aspirate culture grew pseudomonas. MD initially declined to cover due to patient clinically improving. Now patient has worsening of CXR and spiking fevers, so decision was made to cover.   Tmax 101.3, WBC 9.1, sCR 0.32, CrCL ~ 35mL/min  Plan: Ceftazidime 2g Q8H IV x7 days  Monitor Cxs, clinical status, renal function   Height: 5\' 7"  (170.2 cm) Weight: 173 lb 4.5 oz (78.6 kg) IBW/kg (Calculated) : 61.6  Temp (24hrs), Avg:99.6 F (37.6 C), Min:98.3 F (36.8 C), Max:102.3 F (39.1 C)  Recent Labs  Lab 09/26/18 1751  09/27/18 1455 09/28/18 0356 09/29/18 0533 09/30/18 0353 10/01/18 0421 10/02/18 0420  WBC  --    < > 15.7* 13.6* 10.3 9.7 9.1  --   CREATININE  --    < > 0.62 0.57 0.46 0.51 0.36* 0.32*  LATICACIDVEN 1.7  --   --   --   --   --   --   --    < > = values in this interval not displayed.    Estimated Creatinine Clearance: 59.6 mL/min (A) (by C-G formula based on SCr of 0.32 mg/dL (L)).    No Known Allergies  Antimicrobials this admission: Melissa Cochran 5/26 >> CTX 5/21 >>5/25 Vancomycin 5/19 >> 5/21 Unasyn 5/19 >> 5/21  Microbiology results: 5/20 TA: Pseudomonas (S-ceftaz,cipro,Zosy,imipenem) 5/19 BCx: sent 5/19 UCx: Proteus (S-Ancef/Unasyn/CTX/Zosyn) 5/19 COVID: neg  5/19 MRSA pcr: neg  Thank you for allowing pharmacy to be a part of this patient's care.  Melissa Cochran, PharmD PGY1 Pharmacy Resident 10/03/2018    9:21 AM Please check AMION for all [place] numbers

## 2018-10-03 NOTE — Discharge Summary (Signed)
Physician Discharge Summary         Patient ID: Melissa Cochran MRN: [id number] DOB/AGE: [birth year] 83 y.o.  Admit date: 09/26/2018 Discharge date: 10/04/2018  Discharge Diagnoses:   Proteus UTI Pseudomonas HCAP Cellulitis  Chronic Hypoxic Respiratory Failure with Ventilator dependence  Tracheostomy Dependence  Resolved Right pleural effusion (Pleur-x tube removed 5/26) H/o CVA Chronic Atrial Fib (currently NSR) Chronic HFrEF Oral thrush  H/o seizure w/ witnessed breakthru seizure during this stay Anemia of chronic disease Protein Calorie Malnutrition.  Fluid and Electrolyte Imbalance: Hypernatremia and Hyperchloremia  Septic shock AKI  Discharge summary    83 year old female who is chronically vent dependent residing at [facility]. Admitted on 5/19 w/ concern about rising BUN, also found to have fever and leukocytosis meeting SIRS/sepsis criteria. She was originally admitted to the medical service but transferred to the ICU team after she became hypotensive in ER and did not respond to IVF resuscitation. Cultures were sent. Antibiotics started, as well as phenylephrine infusion to support her blood pressure. She was subsequently admitted to the ICU.   Hospital course high-lights:  5/19 admit 5/20 facial twitching history of seizures 5/21: No further seizure activity appreciated, remained on low-dose norepinephrine 5/22: Continues to wean norepinephrine cultures still pending 5/24: off pressors. 5/25: No significant changes.  Only issue at this point is clogged feeding tube which nursing was able to get patent.  Pleurx catheter, not draining.  5/26: No fluid drained from Pleur-x.  Chest x-ray looks worse but primarily consolidation/airspace disease on the right.  Spiking fever.  Changing antibiotics to cover for Pseudomonas now given positive culture.  Chest tube removed.  5/27 no changes. Ready for transfer   Discharge Plan by Active Problems    -proteus UTI,  possible VAP, possible cellulitis -Grew Pseudomonas from sputum, initially felt like she was improving however chest x-ray does look a little worse and she spiked fever last night  plan Complete 14 days Ceftaz (started 5/26)   Chronic hypoxemic respiratory failure /tracheostomy dependence Portable chest x-ray personally reviewed: Melissa Cochran ostomy is in satisfactory position.  The right chest tube is noted towards the apex of the chest worsening aeration appreciated on the right side today when comparing serial films does have bilateral airspace disease Plan Continuing full ventilator support VAP bundle  Chronic encephalopathy s/p prior CVA Minimally responsive Plan Supportive care  Intermittent fluid and electrolyte imbalance: Plan We will intermittently check electrolytes Continuing current free water replacement  Multiple pressure wounds Plan Wound care is outlined by ostomy nurse   History of AF: currently sinus. Chronic HFrEF:  Plan Maintain euvolemia, continuing telemetry monitoring   Oral thrush Plan Continue nystatin  Seizure history -recurrence of seizure on 5/20  Plan Continue Dilantin, Depakote and Keppra   Anemia -No evidence of active bleed Plan Intermittent CBC  Malnutrition P Continue tube feeds   Significant Hospital tests/ studies  EEG 5/20 >> no seizure activity, intermittent right hemispheric sharp waves-cortical irritability as prior Procedures    Culture data/antimicrobials   SARSCOV2 > Negative Urine 5/19 >> proteus, resistant to Cipro Blood 5/19 >  Neg  Sputum 5/20 : Moderate GPC RRR, few GNR, few GPC> few pseudomonas sensitive to cipro --------------------------------  Zosyn ED Vancomycin 5/19 >>5/21 Unasyn 5/19 > 5/21 ceftx 5/21 >> 5/26 Ceftaz edema 5/26 Consults    none   Discharge Exam: Blood Pressure (Abnormal) 111/53   Pulse 92   Temperature 99.6 F (37.6 C) (Oral)   Respiration 19   Height  5\' 7"  (1.702 m)    Weight 79.5 kg   Oxygen Saturation 96%   Body Mass Index 27.45 kg/m   General 83 year old chronically ill WF remains vent and trach dependent HENT NCAT no JVD MMM trach is unremarkable  pulm diffuse rhonchi. Decreased on the right  Card RRR no MRG abd PEG abd soft not tender GU voids Neuro unresponsive. (baseline)  Labs at discharge   Lab Results  Component Value Date   CREATININE 0.32 (L) 10/02/2018   BUN 19 10/02/2018   NA 138 10/02/2018   K 4.4 10/02/2018   CL 105 10/02/2018   CO2 25 10/02/2018   Lab Results  Component Value Date   WBC 9.1 10/01/2018   HGB 7.4 (L) 10/01/2018   HCT 26.8 (L) 10/01/2018   MCV 95.7 10/01/2018   PLT 231 10/01/2018   Lab Results  Component Value Date   ALT 21 09/30/2018   AST 21 09/30/2018   ALKPHOS 73 09/30/2018   BILITOT 0.2 (L) 09/30/2018   Lab Results  Component Value Date   INR 1.18 06/25/2017   INR 1.18 02/23/2017   INR 1.27 02/15/2017    Current radiological studies    No results found.  Disposition:    Discharge disposition: [place] Not Defined       Discharge Instructions    Increase activity slowly   Complete by:  As directed       Allergies as of 10/04/2018   No Known Allergies     Medication List    Stop taking these medications   acetaminophen 160 MG/5ML solution Commonly known as:  TYLENOL   carvedilol 3.125 MG tablet Commonly known as:  COREG   collagenase ointment Commonly known as:  SANTYL   furosemide 10 MG/ML injection Commonly known as:  LASIX   morphine 15 MG tablet Commonly known as:  MSIR   multivitamin tablet   OXYGEN   pantoprazole 40 MG injection Commonly known as:  PROTONIX Replaced by:  pantoprazole sodium 40 mg/20 mL Pack   potassium chloride 20 MEQ/15ML (10%) Soln     Take these medications   aspirin 81 MG chewable tablet Place 1 tablet (81 mg total) into feeding tube daily.   cefTAZidime 2 g in sodium chloride 0.9 % 100 mL  Inject 2 g into the vein every 8 (eight) hours.   chlorhexidine 0.12 % solution Commonly known as:  PERIDEX 5 mLs See admin instructions. 5 ml's for trach care 2 times a day   enoxaparin 40 MG/0.4ML injection Commonly known as:  LOVENOX Inject 0.4 mLs (40 mg total) into the skin daily. Start taking on:  Oct 05, 2018   famotidine 20 MG tablet Commonly known as:  PEPCID Place 20 mg into feeding tube daily.   feeding supplement (OSMOLITE 1.5 CAL) Liqd Place 1,000 mLs into feeding tube continuous. What changed:  Another medication with the same name was removed. Continue taking this medication, and follow the directions you see here.   feeding supplement (PRO-STAT SUGAR FREE 64) Liqd Place 30 mLs into feeding tube 3 (three) times daily. What changed:  when to take this   free water Soln Place 300 mLs into feeding tube every 4 (four) hours. What changed:  when to take this   HYDROcodone-acetaminophen 5-325 MG tablet Commonly known as:  NORCO/VICODIN Place 1 tablet into feeding tube every 6 (six) hours as needed for moderate pain. What changed:    when to take this  reasons  to take this   ipratropium-albuterol 0.5-2.5 (3) MG/3ML Soln Commonly known as:  DUONEB Take 3 mLs by nebulization as needed (for acute and chronic respiratory failure, unspecified whether with hypoxia or hypercapnia).   levETIRAcetam 100 MG/ML solution Commonly known as:  KEPPRA Place 10 mLs (1,000 mg total) into feeding tube 2 (two) times daily.   midodrine 5 MG tablet Commonly known as:  PROAMATINE Take 1 tablet (5 mg total) by mouth 3 (three) times daily with meals. What changed:    medication strength  how much to take  when to take this   multivitamin Liqd Place 15 mLs into feeding tube daily. Start taking on:  Oct 05, 2018   NORMAL SALINE FLUSH IV Inject 10 mLs into the vein See admin instructions. Use 10 ml's via IV every shift for any used lumens- flush each unused lumen   nystatin  100000 UNIT/ML suspension Commonly known as:  MYCOSTATIN Take 5 mLs (500,000 Units total) by mouth 4 (four) times daily.   pantoprazole sodium 40 mg/20 mL Pack Commonly known as:  PROTONIX Place 20 mLs (40 mg total) into feeding tube daily. Start taking on:  Oct 05, 2018 Replaces:  pantoprazole 40 MG injection   phenytoin 150 mg in sodium chloride 0.9 % 100 mL Inject 150 mg into the vein at bedtime.   phenytoin 50 MG/ML injection Commonly known as:  DILANTIN Inject 2 mLs (100 mg total) into the vein 2 (two) times daily. Start taking on:  Oct 05, 2018   Valproate Sodium 250 MG/5ML Soln solution Commonly known as:  DEPAKENE Place 5 mLs (250 mg total) into feeding tube 2 (two) times daily.        Follow-up appointment   NA Discharge Condition:    stable  Physician Statement:   The Patient was personally examined, the discharge assessment and plan has been personally reviewed and I agree with ACNP Cheralyn Oliver's assessment and plan. 36  minutes of time have been dedicated to discharge assessment, planning and discharge instructions.   Signed: Clementeen Graham 10/04/2018, 3:38 PM

## 2018-10-04 DIAGNOSIS — J151 Pneumonia due to Pseudomonas: Secondary | ICD-10-CM | POA: Diagnosis not present

## 2018-10-04 DIAGNOSIS — J9621 Acute and chronic respiratory failure with hypoxia: Secondary | ICD-10-CM

## 2018-10-04 DIAGNOSIS — Z9911 Dependence on respirator [ventilator] status: Secondary | ICD-10-CM | POA: Diagnosis not present

## 2018-10-04 DIAGNOSIS — N179 Acute kidney failure, unspecified: Secondary | ICD-10-CM | POA: Diagnosis not present

## 2018-10-04 DIAGNOSIS — R6521 Severe sepsis with septic shock: Secondary | ICD-10-CM | POA: Diagnosis not present

## 2018-10-04 DIAGNOSIS — A419 Sepsis, unspecified organism: Secondary | ICD-10-CM | POA: Diagnosis not present

## 2018-10-04 DIAGNOSIS — Z20828 Contact with and (suspected) exposure to other viral communicable diseases: Secondary | ICD-10-CM | POA: Diagnosis not present

## 2018-10-04 LAB — GLUCOSE, CAPILLARY
Glucose-Capillary: 73 mg/dL (ref 70–99)
Glucose-Capillary: 82 mg/dL (ref 70–99)
Glucose-Capillary: 86 mg/dL (ref 70–99)
Glucose-Capillary: 91 mg/dL (ref 70–99)
Glucose-Capillary: 97 mg/dL (ref 70–99)
Glucose-Capillary: 98 mg/dL (ref 70–99)
Glucose-Capillary: 98 mg/dL (ref 70–99)

## 2018-10-04 MED ORDER — SODIUM CHLORIDE 0.9 % IV SOLN: 2.0000 g | Freq: Three times a day (TID) | INTRAVENOUS | Status: DC

## 2018-10-04 MED ORDER — SODIUM CHLORIDE 0.9 % IV SOLN: 150.0000 mg | Freq: Every day | INTRAVENOUS | Status: DC

## 2018-10-04 MED ORDER — OSMOLITE 1.5 CAL PO LIQD: 1000.0000 mL | ORAL | 0 refills | Status: DC

## 2018-10-04 MED ORDER — HYDROCODONE-ACETAMINOPHEN 5-325 MG PO TABS: 1.0000 | ORAL_TABLET | Freq: Four times a day (QID) | ORAL | 0 refills | Status: DC | PRN

## 2018-10-04 MED ORDER — ADULT MULTIVITAMIN LIQUID CH: 15.0000 mL | Freq: Every day | ORAL | Status: AC

## 2018-10-04 MED ORDER — NYSTATIN 100000 UNIT/ML MT SUSP: 5.0000 mL | Freq: Four times a day (QID) | OROMUCOSAL | 0 refills | Status: DC

## 2018-10-04 MED ORDER — PRO-STAT SUGAR FREE PO LIQD: 30.0000 mL | Freq: Three times a day (TID) | ORAL | 0 refills | Status: AC

## 2018-10-04 MED ORDER — PHENYTOIN SODIUM 50 MG/ML IJ SOLN: 100.0000 mg | Freq: Two times a day (BID) | INTRAMUSCULAR | Status: DC

## 2018-10-04 MED ORDER — PANTOPRAZOLE SODIUM 40 MG PO PACK: 40.0000 mg | PACK | Freq: Every day | ORAL | Status: AC

## 2018-10-04 MED ORDER — ENOXAPARIN SODIUM 40 MG/0.4ML ~~LOC~~ SOLN: 40.0000 mg | SUBCUTANEOUS | Status: AC

## 2018-10-04 MED ORDER — FREE WATER: 300.0000 mL | Status: DC

## 2018-10-04 MED ORDER — MIDODRINE HCL 5 MG PO TABS: 5.0000 mg | ORAL_TABLET | Freq: Three times a day (TID) | ORAL | Status: AC

## 2018-10-04 NOTE — Consult Note (Signed)
   [facility] CM Inpatient Consult   10/04/2018  [name] 05-Nov-1935 [id number]   Patient screened for extreme high risk score for unplanned readmission score  and/or for hospitalizations to check if potential [employer] services are needed.  Review of patient's medical record reveals patient is returning to [facility].  Patient in the Medicare ACO.     For questions contact:   Natividad Brood, RN BSN [facility] Liaison  [phone] business mobile phone Toll free office [phone]  Fax number: [phone] [email or web address]

## 2018-10-04 NOTE — Progress Notes (Addendum)
CSW received call from RN that patient is ready to return to [facility]. CSW contacted Erline Levine to discuss patient's return. Left a voicemail, awaiting a call back.  Laveda Abbe, [place] Clinical Social Worker [phone]   ADDENDUM 1:02 PM: CSW spoke with Erline Levine at [facility] and they are able to take the patient back, but patient will need an updated COVID test as it had been 8 days. CSW paged MD to order.  Laveda Abbe, [place] Clinical Social Worker [phone]

## 2018-10-04 NOTE — NC FL2 (Signed)
[place] LEVEL OF CARE SCREENING TOOL     IDENTIFICATION  Patient Name: Melissa Cochran Birthdate: 1935/11/26 Sex: female Admission Date (Current Location): 09/26/2018  [facility] and [place] Number:  Herbalist and Address:  [facility], [place] [address], [place], [place] [address]      Provider Number: [id number]  Attending Physician Name and Address:  Chesley Mires, MD  Relative Name and Phone Number:       Current Level of Care: Hospital Recommended Level of Care: [place] Prior Approval Number:    Date Approved/Denied:   PASRR Number: [id number]  Discharge Plan: SNF    Current Diagnoses: Patient Active Problem List   Diagnosis Date Noted  . Goals of care, counseling/discussion   . Palliative care by specialist   . Meningioma, cerebral ([place]) 09/27/2018  . Other emphysema ([place]) 09/27/2018  . Febrile illness, acute   . Severe sepsis with septic shock ([place]) 09/26/2018  . Hypernatremia 09/26/2018  . AKI (acute kidney injury) ([place]) 09/26/2018  . CVA (cerebral vascular accident) ([place]) 04/19/2018  . Chronic respiratory failure ([place]) 04/19/2018  . E-coli UTI, ESBL  06/30/2017  . A-fib ([place]) 06/30/2017  . Need for protective airway ventilation 06/30/2017  . Seizures ([place]) 06/26/2017  . Pressure ulcer 02/17/2017  . Bilateral lower extremity edema 10/08/2016  . Venous ulcer of ankle ([place]) 07/08/2016  . DVT (deep venous thrombosis) ([place]) 07/02/2016  . S/P coronary artery stent placement 10/30/2015  . Malignant neoplasm of trigone of bladder ([place]) 09/29/2015  . Mass of lower lobe of left lung, incidetnal on CT 09/2015, smoker.  09/29/2015  . Other microscopic hematuria 08/26/2015  . Urge incontinence 08/26/2015  . Essential (primary) hypertension 12/09/2014  . Adnexal mass 11/28/2013  . History of cardiac catheterization 11/28/2013  . Hyperlipidemia 11/28/2013    Orientation RESPIRATION BLADDER Height  & Weight     (unable to assess)  Vent(O2 at 30%) Incontinent Weight: 175 lb 4.3 oz (79.5 kg) Height:  '5\' 7"'$  (170.2 cm)  BEHAVIORAL SYMPTOMS/MOOD NEUROLOGICAL BOWEL NUTRITION STATUS    Convulsions/Seizures Incontinent Feeding tube  AMBULATORY STATUS COMMUNICATION OF NEEDS Skin   Total Care Does not communicate PU Stage and Appropriate Care, Other (Comment)(pressure injury unstageable: right and left heels, gauze dressing changed daily; left leg, hydrocolloid and gauze dressing changed daily)   PU Stage 2 Dressing: Daily(coccyx, hydrocolloid and foam dressing)                   Personal Care Assistance Level of Assistance  Bathing, Feeding, Dressing Bathing Assistance: Maximum assistance Feeding assistance: Maximum assistance Dressing Assistance: Maximum assistance     Functional Limitations Info  Sight, Hearing, Speech Sight Info: Adequate Hearing Info: Adequate Speech Info: Impaired    SPECIAL CARE FACTORS FREQUENCY                       Contractures Contractures Info: Not present    Additional Factors Info  Code Status, Allergies, Insulin Sliding Scale Code Status Info: Full Allergies Info: NKA   Insulin Sliding Scale Info: 0-15 units every 4 hours       Current Medications (10/04/2018):  This is the current hospital active medication list Current Facility-Administered Medications  Medication Dose Route Frequency Provider Last Rate Last Dose  . 0.9 %  sodium chloride infusion  250 mL Intravenous Continuous Ina Homes, MD 10 mL/hr at 10/04/18 1200    . acetaminophen (TYLENOL) solution 650  mg  650 mg Per Tube Q6H PRN Ina Homes, MD   650 mg at 10/04/18 0011  . aspirin chewable tablet 81 mg  81 mg Per Tube Daily Ina Homes, MD   81 mg at 10/04/18 1209  . cefTAZidime (FORTAZ) 2 g in sodium chloride 0.9 % 100 mL IVPB  2 g Intravenous Q8H Rush Barer, RPH   Stopped at 10/04/18 0602  . chlorhexidine gluconate (MEDLINE KIT) (PERIDEX) 0.12 %  solution 15 mL  15 mL Mouth Rinse BID Gilles Chiquito B, MD   15 mL at 10/04/18 0813  . Chlorhexidine Gluconate Cloth 2 % PADS 6 each  6 each Topical Daily Sid Falcon, MD   6 each at 10/03/18 2044  . enoxaparin (LOVENOX) injection 40 mg  40 mg Subcutaneous Q24H Helberg, Justin, MD   40 mg at 10/04/18 1210  . feeding supplement (OSMOLITE 1.5 CAL) liquid 1,000 mL  1,000 mL Per Tube Continuous Sid Falcon, MD 40 mL/hr at 10/04/18 0011 1,000 mL at 10/04/18 0011  . feeding supplement (PRO-STAT SUGAR FREE 64) liquid 30 mL  30 mL Per Tube TID Gilles Chiquito B, MD   30 mL at 10/04/18 1210  . free water 300 mL  300 mL Per Tube Q4H Erick Colace, NP   300 mL at 10/04/18 1217  . HYDROcodone-acetaminophen (NORCO/VICODIN) 5-325 MG per tablet 1 tablet  1 tablet Per Tube Q6H PRN Rigoberto Noel, MD   1 tablet at 10/02/18 2041  . insulin aspart (novoLOG) injection 0-15 Units  0-15 Units Subcutaneous Q4H Mosetta Anis, MD   2 Units at 10/03/18 0020  . ipratropium-albuterol (DUONEB) 0.5-2.5 (3) MG/3ML nebulizer solution 3 mL  3 mL Nebulization PRN Helberg, Larkin Ina, MD      . levETIRAcetam (KEPPRA) 100 MG/ML solution 1,000 mg  1,000 mg Per Tube BID Ina Homes, MD   1,000 mg at 10/04/18 1208  . lipase/protease/amylase) Rosann Auerbach) tablets 20,880 Units  20,880 Units Per Tube Once Erick Colace, NP       And  . sodium bicarbonate tablet 650 mg  650 mg Per Tube Once Erick Colace, NP      . MEDLINE mouth rinse  15 mL Mouth Rinse 10 times per day Gilles Chiquito B, MD   15 mL at 10/04/18 1217  . midazolam (VERSED) bolus via infusion 2-4 mg  2-4 mg Intravenous Q15 min PRN Rigoberto Noel, MD      . midodrine (PROAMATINE) tablet 5 mg  5 mg Oral TID WC Rigoberto Noel, MD   5 mg at 10/04/18 1209  . multivitamin liquid 15 mL  15 mL Per Tube Daily Ina Homes, MD   15 mL at 10/04/18 1208  . nystatin (MYCOSTATIN) 100000 UNIT/ML suspension 500,000 Units  5 mL Oral QID Sid Falcon, MD   500,000 Units at  10/04/18 1208  . pantoprazole sodium (PROTONIX) 40 mg/20 mL oral suspension 40 mg  40 mg Per Tube Daily Ina Homes, MD   40 mg at 10/04/18 1209  . phenytoin (DILANTIN) 150 mg in sodium chloride 0.9 % 100 mL IVPB  150 mg Intravenous QHS Rigoberto Noel, MD   Stopped at 10/03/18 2343  . phenytoin (DILANTIN) injection 100 mg  100 mg Intravenous BID Rigoberto Noel, MD   100 mg at 10/04/18 0526  . sodium chloride flush (NS) 0.9 % injection 10-40 mL  10-40 mL Intracatheter Q12H Sid Falcon, MD   10 mL at  10/04/18 1211  . sodium chloride flush (NS) 0.9 % injection 10-40 mL  10-40 mL Intracatheter PRN Gilles Chiquito B, MD      . valproic acid (DEPAKENE) solution 250 mg  250 mg Per Tube BID Ina Homes, MD   250 mg at 10/04/18 1209     Discharge Medications: Please see discharge summary for a list of discharge medications.  Relevant Imaging Results:  Relevant Lab Results:   Additional Information SS#: [id number]  Geralynn Ochs, LCSW

## 2018-10-04 NOTE — Progress Notes (Signed)
CSW alerted by RN that COVID test that was ordered is not rapid, the results will not come back today. [facility] will not take patient back without updated COVID test. Patient not discharging tonight.  CSW to continue to follow.  Laveda Abbe, [place] Clinical Social Worker [phone]

## 2018-10-04 NOTE — Progress Notes (Signed)
[id number]  NAME:  Melissa Cochran, MRN:  [id number], DOB:  Aug 20, 1935, LOS: 8 ADMISSION DATE:  09/26/2018, CONSULTATION DATE:  5/20 REFERRING MD:  Dr, Thayer Headings, CHIEF COMPLAINT:  Sepsis  Brief History   83 year old female with chronic vent need who resides at [facility]. Recent admissions for sepsis 2/2 acinetobacter PNA. Admitted 5/19 for sepsis. Started on pressors.   Since her last admission she has had R sided chest tube placed, for reasons that are unclear.   Family has been involved in many palliative care discussions and has been steadfast in their desire for aggressive care.   Past Medical History   has a past medical history of Adnexal mass, Anxiety, Arthritis, Bladder tumor, Bleeding ulcer, CHF (congestive heart failure) ([place]), CHF (congestive heart failure) ([place]), Chronic bronchitis ([place]), Chronic respiratory failure ([place]) (04/19/2018), COPD (chronic obstructive pulmonary disease) ([place]), Coronary artery disease, CVA (cerebral vascular accident) ([place]) (04/19/2018), DVT (deep venous thrombosis) ([place]), GERD (gastroesophageal reflux disease), Heart block, History of bleeding ulcers, History of kidney stones, Hyperlipemia, Hypertension, Myocardial infarction ([place]), Pneumonia, Seizures ([place]) (02/2017 "several"; 06/26/2017 X 1), Shortness of breath dyspnea, and Stroke ([place]) (02/13/2018).  Significant Hospital Events   5/19 admit 5/20 facial twitching history of seizures 5/21: No further seizure activity appreciated, remains on low-dose norepinephrine 5/22: Continues to wean norepinephrine cultures still pending 5/24: With off pressors. 5/25: No significant changes.  Only issue at this point is clogged feeding tube.  Working on getting this working again.  Draining Pleurx catheter, sending this for culture.  Otherwise ready for transfer back to [facility] 5/26: No fluid drained from Pleurx.  Chest x-ray looks worse but primarily consolidation/airspace disease on the right.  Spiking fever.   Changing antibiotics to cover for Pseudomonas now given positive culture. 5/27: No issues overnight.  Chest tube was removed on 5/26.  Fever curve trending down.  Awaiting transfer back to [facility] Consults:    Procedures:    Significant Diagnostic Tests:  EEG 5/20 >> no seizure activity, intermittent right hemispheric sharp waves-cortical irritability as prior  Micro Data:  SARSCOV2 > Negative Urine 5/19 >> proteus, resistant to Cipro Blood 5/19 >  Neg  Sputum 5/20 : Moderate GPC RRR, few GNR, few GPC> few pseudomonas sensitive to cipro  Antimicrobials:  Zosyn ED Vancomycin 5/19 >>5/21 Unasyn 5/19 > 5/21 ceftx 5/21 >> 5/26 Ceftaz edema 5/26  Interim history/subjective:  No changes, fever curve improved  Objective   Blood pressure (Abnormal) 107/57, pulse 89, temperature 98.3 F (36.8 C), temperature source Oral, resp. rate 17, height 5\' 7"  (1.702 m), weight 79.5 kg, SpO2 95 %.    Vent Mode: PRVC FiO2 (%):  [30 %] 30 % Set Rate:  [16 bmp] 16 bmp Vt Set:  [490 mL] 490 mL PEEP:  [5 cmH20] 5 cmH20 Plateau Pressure:  [24 cmH20-26 cmH20] 24 cmH20   Intake/Output Summary (Last 24 hours) at 10/04/2018 0817 Last data filed at 10/04/2018 0700 Gross per 24 hour  Intake 3172.02 ml  Output 2980 ml  Net 192.02 ml   Filed Weights   10/02/18 0328 10/03/18 0500 10/04/18 0452  Weight: 76.5 kg 78.6 kg 79.5 kg        Examination:  General: Chronically ill 83 year old ventilator dependent female remains essentially unresponsive HEENT her tracheostomy is unremarkable mucous membranes moist sclera nonicteric Pulmonary: Coarse scattered rhonchi diminished more on the right Cardiac: Regular rate and rhythm without murmur rub or gallop Abdomen: Soft nontender tolerating tube feeds does have liquid stool GU: Clear  yellow Neuro: Unresponsive Extremities: Anasarca, multiple pressure ulcers brisk capillary refill  Resolved Hospital Problem list   Hypernatremia and hyperchloremia  AKI   Sepsis Septic shock Proteus UTI Possible cellulitis  Assessment & Plan:    Pseudomonas PNA Fever curve improved plan Day #2 of 14 ceftaz edema   Chronic hypoxemic respiratory failure /tracheostomy dependence Plan Ventilator support VAP bundle  Chronic encephalopathy s/p prior CVA Minimally responsive Plan Supportive care   Intermittent fluid and electrolyte imbalance: Plan Intermittent electrolyte checks  No change in free water replacement    Multiple pressure wounds Plan Continuing wound care as outlined by wound ostomy nurse  History of AF: currently sinus. Chronic HFrEF:  Plan Maintaining euvolemia continuing telemetry monitoring, would not necessarily need to continue telemetry monitoring outside of hospital     Oral thrush Plan Oral nystatin  Seizure history -recurrence of seizure on 5/20  Plan Continue Dilantin, Depakote and Keppra at current dosing   Anemia -No evidence of active bleed Plan Intermittently checking CBC  Malnutrition P Tube feeds  Best practice:  Diet: Tube feeds Pain/Anxiety/Delirium protocol (if indicated): PRN vicodin VAP protocol (if indicated): Per protocol DVT prophylaxis: Lovenox GI prophylaxis: protonix Glucose control: SSI Mobility: BR Code Status: FULL Family Communication: pending  Disposition: Awaiting transfer back to [place] ACNP-BC [place] Pager # [phone] OR # [phone] if no answer

## 2018-10-04 NOTE — Progress Notes (Signed)
Spoke with [place] on unit. [facility] needs recent COVID test before patient can return, otherwise bed is available. Also need a discharge order and discharge summary from physician. Paged MD. Will continue to monitor   Dewaine Oats, RN

## 2018-10-04 NOTE — TOC Transition Note (Signed)
Transition of Care [facility]) - CM/SW Discharge Note   Patient Details  Name: Melissa Cochran MRN: [id number] Date of Birth: [birth year]  [facility] of [facility] CM/SW Contact:  Geralynn Ochs, LCSW Phone Number: 10/04/2018, 4:54 PM   Clinical Narrative:   Nurse to call report to [phone].    Final next level of care: [place] Barriers to Discharge: No Barriers Identified, Barriers Resolved   Patient Goals and CMS Choice        Discharge Placement              Patient chooses bed at: [facility] Patient to be transferred to facility by: [place] Name of family member notified: Rhonda Patient and family notified of of transfer: 10/04/18  Discharge Plan and Services                                     Social Determinants of Health (SDOH) Interventions     Readmission Risk Interventions No flowsheet data found.

## 2018-10-04 NOTE — Progress Notes (Signed)
Palliative note:   Palliative has had several conversations with family and they have continued to desire full scope care.   Noted plan for patient to return to [facility].   Recommend Palliative followup at [facility]- please kindly include in discharge summary.   Palliative will sign off for now- please reconsult if needed.   Thank you for including Palliative medicine in this patient's care.   Mariana Kaufman, AGNP-C Palliative Medicine  Please call Palliative Medicine team phone with any questions [phone]. For individual providers please see AMION.  No charge

## 2018-10-05 DIAGNOSIS — R6521 Severe sepsis with septic shock: Secondary | ICD-10-CM | POA: Diagnosis not present

## 2018-10-05 DIAGNOSIS — A419 Sepsis, unspecified organism: Secondary | ICD-10-CM | POA: Diagnosis not present

## 2018-10-05 DIAGNOSIS — J9621 Acute and chronic respiratory failure with hypoxia: Secondary | ICD-10-CM | POA: Diagnosis not present

## 2018-10-05 DIAGNOSIS — Z9911 Dependence on respirator [ventilator] status: Secondary | ICD-10-CM | POA: Diagnosis not present

## 2018-10-05 DIAGNOSIS — N179 Acute kidney failure, unspecified: Secondary | ICD-10-CM | POA: Diagnosis not present

## 2018-10-05 DIAGNOSIS — Z20828 Contact with and (suspected) exposure to other viral communicable diseases: Secondary | ICD-10-CM | POA: Diagnosis not present

## 2018-10-05 DIAGNOSIS — J151 Pneumonia due to Pseudomonas: Secondary | ICD-10-CM | POA: Diagnosis not present

## 2018-10-05 LAB — NOVEL CORONAVIRUS, NAA (HOSP ORDER, SEND-OUT TO REF LAB; TAT 18-24 HRS): SARS-CoV-2, NAA: NOT DETECTED

## 2018-10-05 LAB — GLUCOSE, CAPILLARY
Glucose-Capillary: 85 mg/dL (ref 70–99)
Glucose-Capillary: 78 mg/dL (ref 70–99)
Glucose-Capillary: 72 mg/dL (ref 70–99)

## 2018-10-05 MED ORDER — CHLORHEXIDINE GLUCONATE CLOTH 2 % EX PADS
6.0000 | MEDICATED_PAD | Freq: Every day | CUTANEOUS | Status: DC
  Administered 2018-10-05: 6 via TOPICAL

## 2018-10-05 MED ORDER — JUVEN PO PACK
1.0000 | PACK | Freq: Two times a day (BID) | ORAL | Status: DC
  Administered 2018-10-05: 11:00:00 1
  Filled 2018-10-05 (×2): qty 1

## 2018-10-05 MED ORDER — PRO-STAT SUGAR FREE PO LIQD: 30.0000 mL | Freq: Four times a day (QID) | ORAL | Status: DC

## 2018-10-05 NOTE — TOC Transition Note (Signed)
Transition of Care [facility]) - CM/SW Discharge Note **RN call report to [phone]**   Patient Details  Name: Melissa Cochran MRN: [id number] Date of Birth: 09/16/1935  [facility]) CM/SW Contact:  Sable Feil, LCSW Phone Number: 10/05/2018, 10:59 AM   Clinical Narrative:  CSW advised that patient can discharge today. Hansel Starling with [facility] contacted [phone]) and advised of today's discharge. Discharge summary and COVID-19 test result transmitted to facility. Nurse provided with phone number above to call report. Call made to daughter Melissa Cochran ([phone]) and advised of discharge. Ms. Mckenney reported that [facility] usually calls her once her mother arrives and gets consent to treat from her.     Final next level of care: Skilled Nursing Facility Barriers to Discharge: No Barriers Identified, Barriers Resolved   Patient Goals and CMS Choice  Not known. Daughter was appreciative of CSW's call regarding her mother's discharge.      Discharge Placement              Patient chooses bed at: [facility] [facility] Patient to be transferred to facility by: [place] Name of family member notified: Melissa Cochran Patient and family notified of of transfer: 10/04/18  Discharge Plan and Services  Discharge back to [facility]                                  Social Determinants of Health (SDOH) Interventions  No SDOH interventions needed prior to discharge.   Readmission Risk Interventions No flowsheet data found.

## 2018-10-05 NOTE — Progress Notes (Signed)
Messaged left to Education officer, museum, [place]. Covid test negative. Awaiting bed at [facility].

## 2018-10-05 NOTE — Progress Notes (Signed)
Report called to [place], Therapist, sports at [facility].

## 2018-10-05 NOTE — Consult Note (Addendum)
WOC follow-up: WOC consult was performed on 5/20, and hydrocolloid dressings were ordered for topical treatment. Please refer to that note for assessment, wound descriptions, and plan of care. Informed that  the hospital supply chain is currently out of stock for this item and requested to provide an alternative dressing option.  Topical treatment orders provided for staff nurses to apply foam dressings and change Q 3 days or PRN soiling. Please re-consult if further assistance is needed.  Thank-you,  Julien Girt MSN, [place], [place], [place], [place]

## 2018-10-05 NOTE — Progress Notes (Signed)
Interval: Discharged to [facility] yesterday however they required COVID-19 test.  These results are now back and are negative.  She remains clear for discharge today  Subjective: No change overnight.  Objective: Blood Pressure (Abnormal) 110/56   Pulse 95   Temperature 98.9 F (37.2 C) (Oral)   Respiration 11   Height 5\' 7"  (1.702 m)   Weight 79.6 kg   Oxygen Saturation 97%   Body Mass Index 27.49 kg/m    Intake/Output Summary (Last 24 hours) at 10/05/2018 0759 Last data filed at 10/05/2018 0600 Gross per 24 hour  Intake 2937.97 ml  Output 1706 ml  Net 1231.97 ml    General chronically ill tracheostomy dependent 83 year old female who remains ventilator dependent is well she is minimally responsive HEENT normocephalic atraumatic tracheostomy unremarkable Pulmonary: Some scattered rhonchi particularly with chest physiotherapy diminished on the right Cardiac: Regular rate and rhythm Abdomen: Soft nontender Extremities: Warm and dry with dependent edema Neuro: Unresponsive  Active problem list:  Pseudomonas ventilator associated pneumonia Plan Complete 14 days of ceftaz edema, started on 5/26  Chronic hypoxic respiratory failure/tracheostomy dependence Plan Continue full ventilatory support Ventilator associated pneumonia interventions  Chronic encephalopathy/persistent vegetative state 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 fib, currently sinus rhythm history of heart failure with reduced ejection fraction Plan Maintaining euvolemia  Anemia without evidence of bleeding  Plan Intermittent CBC  Protein calorie malnutrition Plan Continue tube feeds   Please see extensive discharge summary completed yesterday on 5/27.  Remains stable for discharge back to [place]  ACNP-BC [place] Pager # [phone] OR # [phone] if no answer

## 2018-10-05 NOTE — Progress Notes (Signed)
Nutrition Follow-up  DOCUMENTATION CODES:   Not applicable  INTERVENTION:   Tube Feeding:  Continue Osmolite 1.5 at 40 ml/hr Pro-Stat 30 mL QID Provides 120 g of protein, 1840 kcals and 730 mL of free water  Resume Juven BID,each packet provides 80 calories, 8 grams of carbohydrate, 2.5 grams of protein (collagen), 7 grams of L-arginine and 7 grams of L-glutamine; supplement contains CaHMB, Vitamins C, E, B12 and Zinc to promote wound healing    NUTRITION DIAGNOSIS:   Increased nutrient needs related to wound healing as evidenced by estimated needs.  Being addressed via TF and supplements  GOAL:   Patient will meet greater than or equal to 90% of their needs  Met  MONITOR:   Vent status, TF tolerance, Labs, Weight trends, I & O's, Skin  REASON FOR ASSESSMENT:   Consult, Ventilator Assessment of nutrition requirement/status, Enteral/tube feeding initiation and management  ASSESSMENT:   83 year old female with chronic hypoxic respiratory failure and ventilator/PEG tube dependence, CAD s/p stent in 5830, chronic systolic heart failure with EF of 45 to 50% by echo on 06/2017, atrial fibrillation (apixaban discontinued at last admission due to concerns for anemia), history of CVA, and seizure disorder who presents from [facility] for elevated BUN and creatinine.  Pt is ready for discharge back to [facility]; discharged yesterday but [facility] requested COVID-19 testing which has come back negative. Pt remains ready for discharge  Tolerating Osmolite 1.5 at 40 ml/hr, Pro-Stat 30 mL TID  Labs: reviewed Meds: reviewed  Diet Order:   Diet Order    None      EDUCATION NEEDS:   No education needs have been identified at this time  Skin:  Skin Assessment: Skin Integrity Issues: Skin Integrity Issues:: Unstageable, Stage II, Stage III Stage II: coccyx, left malleolus Stage III: mid-back Unstageable: Lt heel, Rt heel  Last BM:  5/28 rectal tube  Height:   Ht Readings  from Last 1 Encounters:  09/26/18 '5\' 7"'$  (1.702 m)    Weight:   Wt Readings from Last 1 Encounters:  10/05/18 79.6 kg    Ideal Body Weight:  54.5 kg(Ht from April 2020, 5'4")  BMI:  Body mass index is 27.49 kg/m.  Estimated Nutritional Needs:   Kcal:  9407  Protein:  120-145 g   Fluid:  >/= 1.8 L   Kerman Passey MS, RD, LDN, CNSC [phone] Pager  [phone] Weekend/On-Call Pager

## 2018-10-06 DIAGNOSIS — M6281 Muscle weakness (generalized): Secondary | ICD-10-CM | POA: Diagnosis not present

## 2018-10-07 DIAGNOSIS — J90 Pleural effusion, not elsewhere classified: Secondary | ICD-10-CM | POA: Diagnosis not present

## 2018-10-07 DIAGNOSIS — M6281 Muscle weakness (generalized): Secondary | ICD-10-CM | POA: Diagnosis not present

## 2018-10-07 DIAGNOSIS — Z430 Encounter for attention to tracheostomy: Secondary | ICD-10-CM | POA: Diagnosis not present

## 2018-10-08 DIAGNOSIS — J9600 Acute respiratory failure, unspecified whether with hypoxia or hypercapnia: Secondary | ICD-10-CM | POA: Diagnosis not present

## 2018-10-10 DIAGNOSIS — M6281 Muscle weakness (generalized): Secondary | ICD-10-CM | POA: Diagnosis not present

## 2018-10-10 DIAGNOSIS — Z1159 Encounter for screening for other viral diseases: Secondary | ICD-10-CM | POA: Diagnosis not present

## 2018-10-12 DIAGNOSIS — R0989 Other specified symptoms and signs involving the circulatory and respiratory systems: Secondary | ICD-10-CM | POA: Diagnosis not present

## 2018-10-12 DIAGNOSIS — Z1159 Encounter for screening for other viral diseases: Secondary | ICD-10-CM | POA: Diagnosis not present

## 2018-10-12 DIAGNOSIS — M6281 Muscle weakness (generalized): Secondary | ICD-10-CM | POA: Diagnosis not present

## 2018-10-17 DIAGNOSIS — Z1159 Encounter for screening for other viral diseases: Secondary | ICD-10-CM | POA: Diagnosis not present

## 2018-10-17 DIAGNOSIS — M6281 Muscle weakness (generalized): Secondary | ICD-10-CM | POA: Diagnosis not present

## 2018-10-17 DIAGNOSIS — J9600 Acute respiratory failure, unspecified whether with hypoxia or hypercapnia: Secondary | ICD-10-CM | POA: Diagnosis not present

## 2018-10-19 DIAGNOSIS — Z9911 Dependence on respirator [ventilator] status: Secondary | ICD-10-CM | POA: Diagnosis not present

## 2018-10-19 DIAGNOSIS — J9610 Chronic respiratory failure, unspecified whether with hypoxia or hypercapnia: Secondary | ICD-10-CM | POA: Diagnosis not present

## 2018-10-19 DIAGNOSIS — G40909 Epilepsy, unspecified, not intractable, without status epilepticus: Secondary | ICD-10-CM | POA: Diagnosis not present

## 2018-10-21 DIAGNOSIS — J9600 Acute respiratory failure, unspecified whether with hypoxia or hypercapnia: Secondary | ICD-10-CM | POA: Diagnosis not present

## 2018-10-23 DIAGNOSIS — Z1159 Encounter for screening for other viral diseases: Secondary | ICD-10-CM | POA: Diagnosis not present

## 2018-10-23 DIAGNOSIS — M6281 Muscle weakness (generalized): Secondary | ICD-10-CM | POA: Diagnosis not present

## 2018-10-24 DIAGNOSIS — M6281 Muscle weakness (generalized): Secondary | ICD-10-CM | POA: Diagnosis not present

## 2018-10-24 DIAGNOSIS — Z1159 Encounter for screening for other viral diseases: Secondary | ICD-10-CM | POA: Diagnosis not present

## 2018-10-29 DIAGNOSIS — M6281 Muscle weakness (generalized): Secondary | ICD-10-CM | POA: Diagnosis not present

## 2018-10-29 DIAGNOSIS — Z1159 Encounter for screening for other viral diseases: Secondary | ICD-10-CM | POA: Diagnosis not present

## 2018-10-31 DIAGNOSIS — Z1159 Encounter for screening for other viral diseases: Secondary | ICD-10-CM | POA: Diagnosis not present

## 2018-10-31 DIAGNOSIS — M6281 Muscle weakness (generalized): Secondary | ICD-10-CM | POA: Diagnosis not present

## 2018-11-04 DIAGNOSIS — J9621 Acute and chronic respiratory failure with hypoxia: Secondary | ICD-10-CM | POA: Diagnosis not present

## 2018-11-04 DIAGNOSIS — Z9911 Dependence on respirator [ventilator] status: Secondary | ICD-10-CM | POA: Diagnosis not present

## 2018-11-07 DIAGNOSIS — M6281 Muscle weakness (generalized): Secondary | ICD-10-CM | POA: Diagnosis not present

## 2018-11-07 DIAGNOSIS — Z1159 Encounter for screening for other viral diseases: Secondary | ICD-10-CM | POA: Diagnosis not present

## 2018-11-11 ENCOUNTER — Inpatient Hospital Stay (HOSPITAL_COMMUNITY)
Admission: EM | Admit: 2018-11-11 | Discharge: 2018-11-29 | DRG: 698 | Disposition: A | Discharge status: Skilled Nursing Facility | Payer: Medicare Other | Attending: Family Medicine | Admitting: Family Medicine

## 2018-11-11 ENCOUNTER — Encounter (HOSPITAL_COMMUNITY): Payer: Self-pay

## 2018-11-11 ENCOUNTER — Emergency Department (HOSPITAL_COMMUNITY): Payer: Medicare Other

## 2018-11-11 DIAGNOSIS — Z930 Tracheostomy status: Secondary | ICD-10-CM

## 2018-11-11 DIAGNOSIS — E43 Unspecified severe protein-calorie malnutrition: Secondary | ICD-10-CM | POA: Diagnosis present

## 2018-11-11 DIAGNOSIS — E876 Hypokalemia: Secondary | ICD-10-CM | POA: Diagnosis not present

## 2018-11-11 DIAGNOSIS — R402112 Coma scale, eyes open, never, at arrival to emergency department: Secondary | ICD-10-CM | POA: Diagnosis present

## 2018-11-11 DIAGNOSIS — A4150 Gram-negative sepsis, unspecified: Secondary | ICD-10-CM | POA: Diagnosis not present

## 2018-11-11 DIAGNOSIS — Z7901 Long term (current) use of anticoagulants: Secondary | ICD-10-CM

## 2018-11-11 DIAGNOSIS — L8990 Pressure ulcer of unspecified site, unspecified stage: Secondary | ICD-10-CM | POA: Diagnosis present

## 2018-11-11 DIAGNOSIS — L89893 Pressure ulcer of other site, stage 3: Secondary | ICD-10-CM | POA: Diagnosis not present

## 2018-11-11 DIAGNOSIS — E874 Mixed disorder of acid-base balance: Secondary | ICD-10-CM | POA: Diagnosis not present

## 2018-11-11 DIAGNOSIS — J9621 Acute and chronic respiratory failure with hypoxia: Secondary | ICD-10-CM | POA: Diagnosis not present

## 2018-11-11 DIAGNOSIS — Z1624 Resistance to multiple antibiotics: Secondary | ICD-10-CM | POA: Diagnosis present

## 2018-11-11 DIAGNOSIS — Z8673 Personal history of transient ischemic attack (TIA), and cerebral infarction without residual deficits: Secondary | ICD-10-CM

## 2018-11-11 DIAGNOSIS — Z431 Encounter for attention to gastrostomy: Secondary | ICD-10-CM | POA: Diagnosis not present

## 2018-11-11 DIAGNOSIS — J9622 Acute and chronic respiratory failure with hypercapnia: Secondary | ICD-10-CM | POA: Diagnosis present

## 2018-11-11 DIAGNOSIS — E861 Hypovolemia: Secondary | ICD-10-CM | POA: Diagnosis present

## 2018-11-11 DIAGNOSIS — Z9911 Dependence on respirator [ventilator] status: Secondary | ICD-10-CM

## 2018-11-11 DIAGNOSIS — T83511A Infection and inflammatory reaction due to indwelling urethral catheter, initial encounter: Secondary | ICD-10-CM | POA: Diagnosis not present

## 2018-11-11 DIAGNOSIS — E785 Hyperlipidemia, unspecified: Secondary | ICD-10-CM | POA: Diagnosis present

## 2018-11-11 DIAGNOSIS — R402342 Coma scale, best motor response, flexion withdrawal, at arrival to emergency department: Secondary | ICD-10-CM | POA: Diagnosis not present

## 2018-11-11 DIAGNOSIS — D649 Anemia, unspecified: Secondary | ICD-10-CM | POA: Diagnosis present

## 2018-11-11 DIAGNOSIS — K7200 Acute and subacute hepatic failure without coma: Secondary | ICD-10-CM | POA: Diagnosis not present

## 2018-11-11 DIAGNOSIS — G40909 Epilepsy, unspecified, not intractable, without status epilepticus: Secondary | ICD-10-CM | POA: Diagnosis present

## 2018-11-11 DIAGNOSIS — L89610 Pressure ulcer of right heel, unstageable: Secondary | ICD-10-CM | POA: Diagnosis present

## 2018-11-11 DIAGNOSIS — Z955 Presence of coronary angioplasty implant and graft: Secondary | ICD-10-CM

## 2018-11-11 DIAGNOSIS — A419 Sepsis, unspecified organism: Secondary | ICD-10-CM

## 2018-11-11 DIAGNOSIS — R1110 Vomiting, unspecified: Secondary | ICD-10-CM | POA: Diagnosis not present

## 2018-11-11 DIAGNOSIS — Z7982 Long term (current) use of aspirin: Secondary | ICD-10-CM

## 2018-11-11 DIAGNOSIS — J9612 Chronic respiratory failure with hypercapnia: Secondary | ICD-10-CM

## 2018-11-11 DIAGNOSIS — G9349 Other encephalopathy: Secondary | ICD-10-CM | POA: Diagnosis not present

## 2018-11-11 DIAGNOSIS — N390 Urinary tract infection, site not specified: Secondary | ICD-10-CM | POA: Diagnosis not present

## 2018-11-11 DIAGNOSIS — E871 Hypo-osmolality and hyponatremia: Secondary | ICD-10-CM | POA: Diagnosis not present

## 2018-11-11 DIAGNOSIS — I5022 Chronic systolic (congestive) heart failure: Secondary | ICD-10-CM | POA: Diagnosis not present

## 2018-11-11 DIAGNOSIS — Z8551 Personal history of malignant neoplasm of bladder: Secondary | ICD-10-CM

## 2018-11-11 DIAGNOSIS — E119 Type 2 diabetes mellitus without complications: Secondary | ICD-10-CM | POA: Diagnosis present

## 2018-11-11 DIAGNOSIS — Z87891 Personal history of nicotine dependence: Secondary | ICD-10-CM

## 2018-11-11 DIAGNOSIS — J984 Other disorders of lung: Secondary | ICD-10-CM | POA: Diagnosis not present

## 2018-11-11 DIAGNOSIS — Z87440 Personal history of urinary (tract) infections: Secondary | ICD-10-CM

## 2018-11-11 DIAGNOSIS — R140 Abdominal distension (gaseous): Secondary | ICD-10-CM

## 2018-11-11 DIAGNOSIS — R402212 Coma scale, best verbal response, none, at arrival to emergency department: Secondary | ICD-10-CM | POA: Diagnosis present

## 2018-11-11 DIAGNOSIS — Z6821 Body mass index (BMI) 21.0-21.9, adult: Secondary | ICD-10-CM

## 2018-11-11 DIAGNOSIS — Z20828 Contact with and (suspected) exposure to other viral communicable diseases: Secondary | ICD-10-CM | POA: Diagnosis not present

## 2018-11-11 DIAGNOSIS — N3000 Acute cystitis without hematuria: Secondary | ICD-10-CM

## 2018-11-11 DIAGNOSIS — Z86718 Personal history of other venous thrombosis and embolism: Secondary | ICD-10-CM

## 2018-11-11 DIAGNOSIS — E870 Hyperosmolality and hypernatremia: Secondary | ICD-10-CM | POA: Diagnosis not present

## 2018-11-11 DIAGNOSIS — L89154 Pressure ulcer of sacral region, stage 4: Secondary | ICD-10-CM | POA: Diagnosis not present

## 2018-11-11 DIAGNOSIS — M1990 Unspecified osteoarthritis, unspecified site: Secondary | ICD-10-CM | POA: Diagnosis present

## 2018-11-11 DIAGNOSIS — R6521 Severe sepsis with septic shock: Secondary | ICD-10-CM | POA: Diagnosis not present

## 2018-11-11 DIAGNOSIS — Z931 Gastrostomy status: Secondary | ICD-10-CM

## 2018-11-11 DIAGNOSIS — J449 Chronic obstructive pulmonary disease, unspecified: Secondary | ICD-10-CM | POA: Diagnosis present

## 2018-11-11 DIAGNOSIS — Z1159 Encounter for screening for other viral diseases: Secondary | ICD-10-CM

## 2018-11-11 DIAGNOSIS — Z79899 Other long term (current) drug therapy: Secondary | ICD-10-CM

## 2018-11-11 DIAGNOSIS — L89620 Pressure ulcer of left heel, unstageable: Secondary | ICD-10-CM | POA: Diagnosis present

## 2018-11-11 DIAGNOSIS — I110 Hypertensive heart disease with heart failure: Secondary | ICD-10-CM | POA: Diagnosis present

## 2018-11-11 DIAGNOSIS — I2510 Atherosclerotic heart disease of native coronary artery without angina pectoris: Secondary | ICD-10-CM | POA: Diagnosis not present

## 2018-11-11 DIAGNOSIS — N179 Acute kidney failure, unspecified: Secondary | ICD-10-CM | POA: Diagnosis present

## 2018-11-11 DIAGNOSIS — K219 Gastro-esophageal reflux disease without esophagitis: Secondary | ICD-10-CM | POA: Diagnosis present

## 2018-11-11 DIAGNOSIS — I459 Conduction disorder, unspecified: Secondary | ICD-10-CM | POA: Diagnosis present

## 2018-11-11 DIAGNOSIS — Z800 Family history of malignant neoplasm of digestive organs: Secondary | ICD-10-CM

## 2018-11-11 DIAGNOSIS — I252 Old myocardial infarction: Secondary | ICD-10-CM

## 2018-11-11 DIAGNOSIS — Z823 Family history of stroke: Secondary | ICD-10-CM

## 2018-11-11 DIAGNOSIS — R4182 Altered mental status, unspecified: Secondary | ICD-10-CM | POA: Diagnosis not present

## 2018-11-11 DIAGNOSIS — J9610 Chronic respiratory failure, unspecified whether with hypoxia or hypercapnia: Secondary | ICD-10-CM | POA: Diagnosis present

## 2018-11-11 DIAGNOSIS — J9611 Chronic respiratory failure with hypoxia: Secondary | ICD-10-CM

## 2018-11-11 DIAGNOSIS — R509 Fever, unspecified: Secondary | ICD-10-CM

## 2018-11-11 DIAGNOSIS — A4159 Other Gram-negative sepsis: Secondary | ICD-10-CM | POA: Diagnosis not present

## 2018-11-11 DIAGNOSIS — I480 Paroxysmal atrial fibrillation: Secondary | ICD-10-CM | POA: Diagnosis present

## 2018-11-11 DIAGNOSIS — M6281 Muscle weakness (generalized): Secondary | ICD-10-CM | POA: Diagnosis not present

## 2018-11-11 DIAGNOSIS — J9601 Acute respiratory failure with hypoxia: Secondary | ICD-10-CM

## 2018-11-11 DIAGNOSIS — R918 Other nonspecific abnormal finding of lung field: Secondary | ICD-10-CM | POA: Diagnosis not present

## 2018-11-11 DIAGNOSIS — R7881 Bacteremia: Secondary | ICD-10-CM | POA: Diagnosis present

## 2018-11-11 DIAGNOSIS — E878 Other disorders of electrolyte and fluid balance, not elsewhere classified: Secondary | ICD-10-CM | POA: Diagnosis not present

## 2018-11-11 DIAGNOSIS — B961 Klebsiella pneumoniae [K. pneumoniae] as the cause of diseases classified elsewhere: Secondary | ICD-10-CM

## 2018-11-11 LAB — COMPREHENSIVE METABOLIC PANEL
ALT: 163 U/L — ABNORMAL HIGH (ref 0–44)
AST: 216 U/L — ABNORMAL HIGH (ref 15–41)
Albumin: 2 g/dL — ABNORMAL LOW (ref 3.5–5.0)
Alkaline Phosphatase: 180 U/L — ABNORMAL HIGH (ref 38–126)
Anion gap: 19 — ABNORMAL HIGH (ref 5–15)
BUN: 57 mg/dL — ABNORMAL HIGH (ref 8–23)
CO2: 13 mmol/L — ABNORMAL LOW (ref 22–32)
Calcium: 9.8 mg/dL (ref 8.9–10.3)
Chloride: 98 mmol/L (ref 98–111)
Creatinine, Ser: 1.68 mg/dL — ABNORMAL HIGH (ref 0.44–1.00)
GFR calc Af Amer: 33 mL/min — ABNORMAL LOW (ref >60)
GFR calc non Af Amer: 28 mL/min — ABNORMAL LOW (ref >60)
Glucose, Bld: 125 mg/dL — ABNORMAL HIGH (ref 70–99)
Potassium: 5.1 mmol/L (ref 3.5–5.1)
Sodium: 130 mmol/L — ABNORMAL LOW (ref 135–145)
Total Bilirubin: 1.5 mg/dL — ABNORMAL HIGH (ref 0.3–1.2)
Total Protein: 6.2 g/dL — ABNORMAL LOW (ref 6.5–8.1)

## 2018-11-11 LAB — CBC WITH DIFFERENTIAL/PLATELET
Abs Immature Granulocytes: 1.33 10*3/uL — ABNORMAL HIGH (ref 0.00–0.07)
Basophils Absolute: 0.2 10*3/uL — ABNORMAL HIGH (ref 0.0–0.1)
Basophils Relative: 0 %
Eosinophils Absolute: 0.1 10*3/uL (ref 0.0–0.5)
Eosinophils Relative: 0 %
HCT: 39.3 % (ref 36.0–46.0)
Hemoglobin: 11.7 g/dL — ABNORMAL LOW (ref 12.0–15.0)
Immature Granulocytes: 3 %
Lymphocytes Relative: 2 %
Lymphs Abs: 1 10*3/uL (ref 0.7–4.0)
MCH: 28.7 pg (ref 26.0–34.0)
MCHC: 29.8 g/dL — ABNORMAL LOW (ref 30.0–36.0)
MCV: 96.6 fL (ref 80.0–100.0)
Monocytes Absolute: 1.2 10*3/uL — ABNORMAL HIGH (ref 0.1–1.0)
Monocytes Relative: 3 %
Neutro Abs: 41.7 10*3/uL — ABNORMAL HIGH (ref 1.7–7.7)
Neutrophils Relative %: 92 %
Platelets: 387 10*3/uL (ref 150–400)
RBC: 4.07 MIL/uL (ref 3.87–5.11)
RDW: 17.6 % — ABNORMAL HIGH (ref 11.5–15.5)
WBC Morphology: INCREASED
WBC: 45.6 10*3/uL — ABNORMAL HIGH (ref 4.0–10.5)
nRBC: 0 % (ref 0.0–0.2)

## 2018-11-11 LAB — URINALYSIS, ROUTINE W REFLEX MICROSCOPIC
Glucose, UA: NEGATIVE mg/dL
Ketones, ur: NEGATIVE mg/dL
Nitrite: NEGATIVE
Protein, ur: >300 mg/dL — AB
Specific Gravity, Urine: 1.02 (ref 1.005–1.030)
pH: 7.5 (ref 5.0–8.0)

## 2018-11-11 LAB — URINALYSIS, MICROSCOPIC (REFLEX): RBC / HPF: >50 RBC/hpf (ref 0–5)

## 2018-11-11 LAB — POCT I-STAT EG7
Acid-base deficit: 9 mmol/L — ABNORMAL HIGH (ref 0.0–2.0)
Bicarbonate: 13.5 mmol/L — ABNORMAL LOW (ref 20.0–28.0)
Calcium, Ion: 1.23 mmol/L (ref 1.15–1.40)
HCT: 38 % (ref 36.0–46.0)
Hemoglobin: 12.9 g/dL (ref 12.0–15.0)
O2 Saturation: 99 %
Potassium: 5.1 mmol/L (ref 3.5–5.1)
Sodium: 130 mmol/L — ABNORMAL LOW (ref 135–145)
TCO2: 14 mmol/L — ABNORMAL LOW (ref 22–32)
pCO2, Ven: 20.1 mmHg — ABNORMAL LOW (ref 44.0–60.0)
pH, Ven: 7.434 — ABNORMAL HIGH (ref 7.250–7.430)
pO2, Ven: 132 mmHg — ABNORMAL HIGH (ref 32.0–45.0)

## 2018-11-11 LAB — LACTIC ACID, PLASMA: Lactic Acid, Venous: 9.1 mmol/L (ref 0.5–1.9)

## 2018-11-11 LAB — SARS CORONAVIRUS 2 BY RT PCR (HOSPITAL ORDER, PERFORMED IN ~~LOC~~ HOSPITAL LAB): SARS Coronavirus 2: NEGATIVE

## 2018-11-11 MED ORDER — LACTATED RINGERS IV BOLUS
1000.0000 mL | Freq: Once | INTRAVENOUS | Status: AC
  Administered 2018-11-11: 1000 mL via INTRAVENOUS

## 2018-11-11 MED ORDER — SODIUM CHLORIDE 0.9 % IV SOLN
2.0000 g | Freq: Once | INTRAVENOUS | Status: AC
  Administered 2018-11-12: 2 g via INTRAVENOUS
  Filled 2018-11-11: qty 2

## 2018-11-11 MED ORDER — ACETAMINOPHEN 650 MG RE SUPP
650.0000 mg | Freq: Once | RECTAL | Status: AC
  Administered 2018-11-11: 650 mg via RECTAL
  Filled 2018-11-11: qty 1

## 2018-11-11 MED ORDER — VANCOMYCIN HCL IN DEXTROSE 1-5 GM/200ML-% IV SOLN
1000.0000 mg | Freq: Once | INTRAVENOUS | Status: AC
  Administered 2018-11-11: 1000 mg via INTRAVENOUS
  Filled 2018-11-11: qty 200

## 2018-11-11 NOTE — H&P (Addendum)
NAME:  Melissa Cochran, MRN:  [id number], DOB:  Mar 25, 1936, LOS: 0 ADMISSION DATE:  11/11/2018, CONSULTATION DATE: 11/11/2018 REFERRING MD: ED, CHIEF COMPLAINT: Sepsis  Brief History   83 year old woman with history of chronic hypoxic respiratory failure with ventilator/PEG tube dependence, CAD, HFrEF (EF 45-50% 06/2017), paroxysmal atrial fibrillation, adnexal mass, COPD, CVA with chronic encephalopathy, seizure disorder, DVT, GERD, hypertension, recurrent UTI presenting from [facility] with acute on chronic encephalopathy and hypotension found to have UTI.  History of present illness   83 year old woman with history of chronic hypoxic respiratory failure with ventilator/PEG tube dependence, CAD, HFrEF (EF 45-50% 06/2017), paroxysmal atrial fibrillation, adnexal mass, COPD, CVA with chronic encephalopathy, seizure disorder, DVT, GERD, hypertension, recurrent UTI presenting from [facility] with acute on chronic encephalopathy and hypotension.  Per EMR her baseline level of consciousness is eyes open and tracking.  Per daughter, she was corresponding with the [facility] nurses.  There were concerns today for fever, tachycardia.  At one point, she was reported to appear gray.  There was also report of emesis.  Past Medical History  Chronic hypoxic respiratory failure with ventilator/PEG tube dependence, CAD, HFrEF (EF 45-50% 06/2017), paroxysmal atrial fibrillation, adnexal mass, COPD, CVA with chronic encephalopathy, seizure disorder, DVT, GERD, hypertension, recurrent UTI  Significant Hospital Events   7/5> admit  Consults:  none  Procedures:  none  Significant Diagnostic Tests:  CXR 7/4> Improvement in bilateral multifocal airspace disease with mild residual streaky opacities at the bases. Tracheostomy tube and left upper extremity PICC remain in place.   Micro Data:  Resp cx  Blood cx 7/4> Urine cx 7/4>  Antimicrobials:  Vanc 7/4> Meropenem 7/4>  Interim history/subjective:  Admitted   Objective   Blood pressure (!) 90/57, pulse (!) 102, temperature (!) 101 F (38.3 C), temperature source Rectal, resp. rate (!) 24, SpO2 98 %.    Vent Mode: PCV FiO2 (%):  [60 %] 60 % Set Rate:  [30 bmp] 30 bmp PEEP:  [5 cmH20] 5 cmH20 Plateau Pressure:  [28 cmH20] 28 cmH20   Intake/Output Summary (Last 24 hours) at 11/11/2018 2334 Last data filed at 11/11/2018 2314 Gross per 24 hour  Intake 1000 ml  Output -  Net 1000 ml   There were no vitals filed for this visit.  Examination: General: NAD HENT: trach in place Lungs: Scattered rhonchi Cardiovascular: tachycardic but regular Abdomen: soft, distended Extremities: no edema Neuro: Unrespnosive   Assessment & Plan:  83 year old woman with history of chronic hypoxic respiratory failure with ventilator/PEG tube dependence, CAD, HFrEF (EF 45-50% 06/2017), paroxysmal atrial fibrillation, adnexal mass, COPD, CVA with chronic encephalopathy, seizure disorder, DVT, GERD, hypertension, recurrent UTI presenting from [facility] with acute on chronic encephalopathy and hypotension.  In the ED she received 3 L of LR, vancomycin, meropenem.  UTI, sepsis: Temperature up to 101 in ED.  O2 sat normal on 60% FiO2.  Tachycardic up to 117.  BP with lowest map 60.  Lactic acidosis of 9.1.  WBC up to 45.6 with bandemia.  UA with moderate leukocytes, 21-50 WBC, few bacteria.  COVID negative.  Chest x-ray appears improved in terms of bilateral multifocal airspace disease.  EKG with sinus tachycardia. --Foley exchange --Follow-up cultures --Continue vancomycin and meropenem --Trend lactic acid --Monitor blood pressure.  May consider pressors if MAP persistently below 60-65.  She was on midodrine at discharge last hospitalization. Will continue here.  --Wound care consult for her pressure wounds --Obtain abdominal xray given abdominal distention --Check for cdiff as per ED  report she had large watery BM and per emr has history of Cdiff  Chronic hypoxic  respiratory failure with ventilator dependence, respiratory alkalosis: --Adjust minute ventilation to target pH 7.3 --Follow blood gas --Continue vent support  Hyponatremia, metabolic acidosis with increased anion gap, lactic acidosis, AKI: Sodium 130 in the setting of hypovolemia.  Bicarb down to 13.  Creatinine 1.68 with baseline around 0.3. --Monitor urine output and avoid nephrotoxins --Follow-up BMP --Check salicylate level  Transaminitis: Likely secondary to hypovolemia. --Check tylenol level, hepatitis panel --Follow liver enzymes  Seizure history: --Check valproic acid and phenytoin levels --Continue home Keppra, Depakote, phenytoin  CAD: Continue home aspirin  COPD: Continue home duo nebs.   Best practice:  Diet: NPO Pain/Anxiety/Delirium protocol (if indicated): Fentanyl PRN VAP protocol (if indicated): Ordered DVT prophylaxis: SubQ Lovenox GI prophylaxis: Protonix Glucose control: Monitor Mobility: PT when able Code Status: Full code confirmed with daughter Family Communication: Daughter updated Disposition: ICU  Labs   CBC: Recent Labs  Lab 11/11/18 2236 11/11/18 2245  WBC 45.6*  --   NEUTROABS PENDING  --   HGB 11.7* 12.9  HCT 39.3 38.0  MCV 96.6  --   PLT 387  --     Basic Metabolic Panel: Recent Labs  Lab 11/11/18 2236 11/11/18 2245  NA 130* 130*  K 5.1 5.1  CL 98  --   CO2 13*  --   GLUCOSE 125*  --   BUN 57*  --   CREATININE 1.68*  --   CALCIUM 9.8  --    GFR: CrCl cannot be calculated (Unknown ideal weight.). Recent Labs  Lab 11/11/18 2236  WBC 45.6*    Liver Function Tests: Recent Labs  Lab 11/11/18 2236  AST 216*  ALT 163*  ALKPHOS 180*  BILITOT 1.5*  PROT 6.2*  ALBUMIN 2.0*   ABG    Component Value Date/Time   PHART 7.470 (H) 09/26/2018 1758   PCO2ART 44.2 09/26/2018 1758   PO2ART 65.0 (L) 09/26/2018 1758   HCO3 13.5 (L) 11/11/2018 2245   TCO2 14 (L) 11/11/2018 2245   ACIDBASEDEF 9.0 (H) 11/11/2018 2245    O2SAT 99.0 11/11/2018 2245     HbA1C: Hgb A1c MFr Bld  Date/Time Value Ref Range Status  02/13/2017 09:35 PM 5.1 4.8 - 5.6 % Final    Comment:    (NOTE) Pre diabetes:          5.7%-6.4% Diabetes:              >6.4% Glycemic control for   <7.0% adults with diabetes     Review of Systems:   Unable to obtain due to encephalopathy  Past Medical History  She,  has a past medical history of Adnexal mass, Anxiety, Arthritis, Bladder tumor, Bleeding ulcer, CHF (congestive heart failure) ([place]), CHF (congestive heart failure) ([place]), Chronic bronchitis ([place]), Chronic respiratory failure ([place]) (04/19/2018), COPD (chronic obstructive pulmonary disease) ([place]), Coronary artery disease, CVA (cerebral vascular accident) ([place]) (04/19/2018), DVT (deep venous thrombosis) ([place]), GERD (gastroesophageal reflux disease), Heart block, History of bleeding ulcers, History of kidney stones, Hyperlipemia, Hypertension, Myocardial infarction ([place]), Pneumonia, Seizures ([place]) (02/2017 "several"; 06/26/2017 X 1), Shortness of breath dyspnea, and Stroke ([place]) (02/13/2018).   Surgical History    Past Surgical History:  Procedure Laterality Date  . ABDOMINAL HYSTERECTOMY    . CARDIAC CATHETERIZATION Left 10/21/2015   Procedure: Left Heart Cath and Coronary Angiography;  Surgeon: Isaias Cowman, MD;  Location: [place] [facility];  Service: Cardiovascular;  Laterality:  Left;  . CARDIAC CATHETERIZATION N/A 10/21/2015   Procedure: Coronary Stent Intervention;  Surgeon: Isaias Cowman, MD;  Location: [place] [facility];  Service: Cardiovascular;  Laterality: N/A;  . CARDIAC CATHETERIZATION    . CYSTOSCOPY W/ RETROGRADES Bilateral 01/20/2016   Procedure: CYSTOSCOPY WITH RETROGRADE PYELOGRAM;  Surgeon: Hollice Espy, MD;  Location: [facility];  Service: Urology;  Laterality: Bilateral;  . CYSTOSCOPY WITH STENT PLACEMENT Right 01/20/2016   Procedure: CYSTOSCOPY WITH STENT PLACEMENT;  Surgeon: Hollice Espy, MD;   Location: [facility];  Service: Urology;  Laterality: Right;  . HIP FRACTURE SURGERY Left   . STOMACH SURGERY     "stomach ulcers"  . TRACHEOSTOMY TUBE PLACEMENT N/A 03/02/2017   Procedure: TRACHEOSTOMY;  Surgeon: Rozetta Nunnery, MD;  Location: [place];  Service: ENT;  Laterality: N/A;  . TRANSURETHRAL RESECTION OF BLADDER TUMOR N/A 01/20/2016   Procedure: TRANSURETHRAL RESECTION OF BLADDER TUMOR (TURBT);  Surgeon: Hollice Espy, MD;  Location: [facility];  Service: Urology;  Laterality: N/A;     Social History   reports that she quit smoking about 20 years ago. She has a 40.00 pack-year smoking history. She has never used smokeless tobacco. She reports that she does not drink alcohol or use drugs.   Family History   Her family history includes CVA in her father; Stomach cancer in her mother. There is no history of Bladder Cancer or Kidney cancer.   Allergies No Known Allergies   Home Medications  Prior to Admission medications   Medication Sig Start Date End Date Taking? Authorizing Provider  Amino Acids-Protein Hydrolys (FEEDING SUPPLEMENT, PRO-STAT SUGAR FREE 64,) LIQD Place 30 mLs into feeding tube 3 (three) times daily. 10/04/18   Erick Colace, NP  aspirin 81 MG chewable tablet Place 1 tablet (81 mg total) into feeding tube daily. 06/19/18   Cherene Altes, MD  cefTAZidime 2 g in sodium chloride 0.9 % 100 mL Inject 2 g into the vein every 8 (eight) hours. 10/04/18   Erick Colace, NP  chlorhexidine (PERIDEX) 0.12 % solution 5 mLs See admin instructions. 5 ml's for trach care 2 times a day    [provider]  enoxaparin (LOVENOX) 40 MG/0.4ML injection Inject 0.4 mLs (40 mg total) into the skin daily. 10/05/18   Erick Colace, NP  famotidine (PEPCID) 20 MG tablet Place 20 mg into feeding tube daily.     [provider]  HYDROcodone-acetaminophen (NORCO/VICODIN) 5-325 MG tablet Place 1 tablet into feeding tube every 6 (six) hours as needed for moderate pain.  10/04/18   Erick Colace, NP  ipratropium-albuterol (DUONEB) 0.5-2.5 (3) MG/3ML SOLN Take 3 mLs by nebulization as needed (for acute and chronic respiratory failure, unspecified whether with hypoxia or hypercapnia).     [provider]  levETIRAcetam (KEPPRA) 100 MG/ML solution Place 10 mLs (1,000 mg total) into feeding tube 2 (two) times daily. 06/19/18   Cherene Altes, MD  midodrine (PROAMATINE) 5 MG tablet Take 1 tablet (5 mg total) by mouth 3 (three) times daily with meals. 10/04/18   Erick Colace, NP  Multiple Vitamin (MULTIVITAMIN) LIQD Place 15 mLs into feeding tube daily. 10/05/18   Erick Colace, NP  Nutritional Supplements (FEEDING SUPPLEMENT, OSMOLITE 1.5 CAL,) LIQD Place 1,000 mLs into feeding tube continuous. 10/04/18   Erick Colace, NP  nystatin (MYCOSTATIN) 100000 UNIT/ML suspension Take 5 mLs (500,000 Units total) by mouth 4 (four) times daily. 10/04/18   Erick Colace, NP  pantoprazole sodium (PROTONIX) 40 mg/20 mL PACK Place 20 mLs (40 mg total) into feeding tube daily. 10/05/18   Erick Colace, NP  phenytoin (DILANTIN) 50 MG/ML injection Inject 2 mLs (100 mg total) into the vein 2 (two) times daily. 10/05/18   Erick Colace, NP  phenytoin 150 mg in sodium chloride 0.9 % 100 mL Inject 150 mg into the vein at bedtime. 10/04/18   Erick Colace, NP  Sodium Chloride Flush (NORMAL SALINE FLUSH IV) Inject 10 mLs into the vein See admin instructions. Use 10 ml's via IV every shift for any used lumens- flush each unused lumen    [provider]  Valproate Sodium (DEPAKENE) 250 MG/5ML SOLN solution Place 5 mLs (250 mg total) into feeding tube 2 (two) times daily. 06/19/18   Cherene Altes, MD  Water For Irrigation, Sterile (FREE WATER) SOLN Place 300 mLs into feeding tube every 4 (four) hours. 10/04/18   Erick Colace, NP     Critical care time: The patient is critically ill with multiple organ systems failure and requires high complexity decision  making for assessment and support, frequent evaluation and titration of therapies, application of advanced monitoring technologies and extensive interpretation of multiple databases.   Critical Care Time devoted to patient care services described in this note is 60 Minutes. This time reflects time of care of this signee. This critical care time does not reflect procedure time, or teaching time or supervisory time of PA/NP/Med student/Med Resident etc but could involve care discussion time.  Jacques Earthly, M.D. [facility] Pulmonary/Critical Care Medicine After hours pager: [phone].

## 2018-11-11 NOTE — ED Triage Notes (Signed)
Pt comes via [facility] from [place], has had deceased LOC today, will normally look at you but will not today, [facility] reported hypotensive but normotensive with EMS, had negative COVID on 4/1

## 2018-11-11 NOTE — Progress Notes (Addendum)
[facility] Physician-Brief Progress Note Patient Name: Melissa Cochran DOB: 04-17-1936 MRN: [id number]   Date of Service  11/11/2018  HPI/Events of Note  A 83 year old [place] resident with chronic trach and PEG presented to the ED with decreased responsiveness and hypotension.  eICU Interventions  Septic shock probably secondary to cellulitis and UTI.  History of multidrug-resistant bacteria.  Recommended 30 cc per kg IV fluids.  Maintain map more than 65 systolic more than 90 with Levophed if required.  Notified Dr. Randell Patient.      Intervention Category Major Interventions: Sepsis - evaluation and management;Shock - evaluation and management;Hypotension - evaluation and management Intermediate Interventions: Communication with other healthcare providers and/or family Evaluation Type: New Patient Evaluation  Mady Gemma 11/11/2018, 11:23 PM   3:37 AM Lactic acid worsened from 9.1-9.5 with a blood pressure of 80/41. Already received > 30 cc/kg IV fluids Ordered Levophed to maintain map more than 65 or systolic more than 90, either or.   Trend lactic acid.

## 2018-11-11 NOTE — Progress Notes (Signed)
Vent Settings per [facility] and [facility].

## 2018-11-11 NOTE — ED Provider Notes (Addendum)
[facility] EMERGENCY DEPARTMENT Provider Note   CSN: [id number] Arrival date & time: 11/11/18  2152    History   Chief Complaint Chief Complaint  Patient presents with  . Altered Mental Status    HPI Melissa Cochran is a 83 y.o. female.     Level 5 caveat due to altered mental status, nonverbal at baseline.  Patient comes from [place] home.  Chronic vent patient.  Decreased mental status today.  Found to be febrile, hypotensive with EMS.  Decrease mental status started today.  The history is provided by the EMS personnel.  Altered Mental Status Presenting symptoms: unresponsiveness   Severity:  Moderate Most recent episode:  Today Episode history:  Single Timing:  Constant Progression:  Worsening Chronicity:  Recurrent Context comment:  [facility], chronic vent, decreaded LOC, usually track with eyes, only grimaces to pain. Patient with low blood pressure.    Past Medical History:  Diagnosis Date  . Adnexal mass   . Anxiety   . Arthritis   . Bladder tumor   . Bleeding ulcer   . CHF (congestive heart failure) ([place])   . CHF (congestive heart failure) ([place])   . Chronic bronchitis ([place])   . Chronic respiratory failure ([place]) 04/19/2018  . COPD (chronic obstructive pulmonary disease) ([place])   . Coronary artery disease   . CVA (cerebral vascular accident) ([place]) 04/19/2018  . DVT (deep venous thrombosis) (HCC)    RLE  . GERD (gastroesophageal reflux disease)   . Heart block    left  . History of bleeding ulcers   . History of kidney stones   . Hyperlipemia   . Hypertension   . Myocardial infarction ([place])    "slight one" (07/05/2017)  . Pneumonia    "several times" (07/05/2017)  . Seizures ([place]) 02/2017 "several"; 06/26/2017 X 1  . Shortness of breath dyspnea   . Stroke [facility]) 02/13/2018   "still right sided weakness" (07/05/2017)    Patient Active Problem List   Diagnosis Date Noted  . Sepsis ([place]) 11/12/2018  . Goals  of care, counseling/discussion   . Palliative care by specialist   . Meningioma, cerebral ([place]) 09/27/2018  . Other emphysema ([place]) 09/27/2018  . Febrile illness, acute   . Severe sepsis with septic shock ([place]) 09/26/2018  . Hypernatremia 09/26/2018  . AKI (acute kidney injury) ([place]) 09/26/2018  . CVA (cerebral vascular accident) ([place]) 04/19/2018  . Chronic respiratory failure ([place]) 04/19/2018  . E-coli UTI, ESBL  06/30/2017  . A-fib ([place]) 06/30/2017  . Need for protective airway ventilation 06/30/2017  . Seizures ([place]) 06/26/2017  . Pressure ulcer 02/17/2017  . Bilateral lower extremity edema 10/08/2016  . Venous ulcer of ankle ([place]) 07/08/2016  . DVT (deep venous thrombosis) ([place]) 07/02/2016  . S/P coronary artery stent placement 10/30/2015  . Malignant neoplasm of trigone of bladder ([place]) 09/29/2015  . Mass of lower lobe of left lung, incidetnal on CT 09/2015, smoker.  09/29/2015  . Other microscopic hematuria 08/26/2015  . Urge incontinence 08/26/2015  . Essential (primary) hypertension 12/09/2014  . Adnexal mass 11/28/2013  . History of cardiac catheterization 11/28/2013  . Hyperlipidemia 11/28/2013    Past Surgical History:  Procedure Laterality Date  . ABDOMINAL HYSTERECTOMY    . CARDIAC CATHETERIZATION Left 10/21/2015   Procedure: Left Heart Cath and Coronary Angiography;  Surgeon: Isaias Cowman, MD;  Location: [facility] CV LAB;  Service: Cardiovascular;  Laterality: Left;  . CARDIAC CATHETERIZATION N/A 10/21/2015  Procedure: Coronary Stent Intervention;  Surgeon: Isaias Cowman, MD;  Location: [facility] CV LAB;  Service: Cardiovascular;  Laterality: N/A;  . CARDIAC CATHETERIZATION    . CYSTOSCOPY W/ RETROGRADES Bilateral 01/20/2016   Procedure: CYSTOSCOPY WITH RETROGRADE PYELOGRAM;  Surgeon: Hollice Espy, MD;  Location: [facility] ORS;  Service: Urology;  Laterality: Bilateral;  . CYSTOSCOPY WITH STENT PLACEMENT Right 01/20/2016   Procedure: CYSTOSCOPY WITH  STENT PLACEMENT;  Surgeon: Hollice Espy, MD;  Location: [facility] ORS;  Service: Urology;  Laterality: Right;  . HIP FRACTURE SURGERY Left   . STOMACH SURGERY     "stomach ulcers"  . TRACHEOSTOMY TUBE PLACEMENT N/A 03/02/2017   Procedure: TRACHEOSTOMY;  Surgeon: Rozetta Nunnery, MD;  Location: [facility];  Service: ENT;  Laterality: N/A;  . TRANSURETHRAL RESECTION OF BLADDER TUMOR N/A 01/20/2016   Procedure: TRANSURETHRAL RESECTION OF BLADDER TUMOR (TURBT);  Surgeon: Hollice Espy, MD;  Location: [facility] ORS;  Service: Urology;  Laterality: N/A;     OB History   No obstetric history on file.      Home Medications    Prior to Admission medications   Medication Sig Start Date End Date Taking? Authorizing Provider  Amino Acids-Protein Hydrolys (FEEDING SUPPLEMENT, PRO-STAT SUGAR FREE 64,) LIQD Place 30 mLs into feeding tube 3 (three) times daily. 10/04/18   Erick Colace, NP  aspirin 81 MG chewable tablet Place 1 tablet (81 mg total) into feeding tube daily. 06/19/18   Cherene Altes, MD  cefTAZidime 2 g in sodium chloride 0.9 % 100 mL Inject 2 g into the vein every 8 (eight) hours. 10/04/18   Erick Colace, NP  chlorhexidine (PERIDEX) 0.12 % solution 5 mLs See admin instructions. 5 ml's for trach care 2 times a day    [provider]  enoxaparin (LOVENOX) 40 MG/0.4ML injection Inject 0.4 mLs (40 mg total) into the skin daily. 10/05/18   Erick Colace, NP  famotidine (PEPCID) 20 MG tablet Place 20 mg into feeding tube daily.     [provider]  HYDROcodone-acetaminophen (NORCO/VICODIN) 5-325 MG tablet Place 1 tablet into feeding tube every 6 (six) hours as needed for moderate pain. 10/04/18   Erick Colace, NP  ipratropium-albuterol (DUONEB) 0.5-2.5 (3) MG/3ML SOLN Take 3 mLs by nebulization as needed (for acute and chronic respiratory failure, unspecified whether with hypoxia or hypercapnia).     [provider]  levETIRAcetam (KEPPRA) 100 MG/ML solution  Place 10 mLs (1,000 mg total) into feeding tube 2 (two) times daily. 06/19/18   Cherene Altes, MD  midodrine (PROAMATINE) 5 MG tablet Take 1 tablet (5 mg total) by mouth 3 (three) times daily with meals. 10/04/18   Erick Colace, NP  Multiple Vitamin (MULTIVITAMIN) LIQD Place 15 mLs into feeding tube daily. 10/05/18   Erick Colace, NP  Nutritional Supplements (FEEDING SUPPLEMENT, OSMOLITE 1.5 CAL,) LIQD Place 1,000 mLs into feeding tube continuous. 10/04/18   Erick Colace, NP  nystatin (MYCOSTATIN) 100000 UNIT/ML suspension Take 5 mLs (500,000 Units total) by mouth 4 (four) times daily. 10/04/18   Erick Colace, NP  pantoprazole sodium (PROTONIX) 40 mg/20 mL PACK Place 20 mLs (40 mg total) into feeding tube daily. 10/05/18   Erick Colace, NP  phenytoin (DILANTIN) 50 MG/ML injection Inject 2 mLs (100 mg total) into the vein 2 (two) times daily. 10/05/18   Erick Colace, NP  phenytoin 150 mg in sodium chloride 0.9 % 100 mL Inject 150 mg into the vein  at bedtime. 10/04/18   Erick Colace, NP  Sodium Chloride Flush (NORMAL SALINE FLUSH IV) Inject 10 mLs into the vein See admin instructions. Use 10 ml's via IV every shift for any used lumens- flush each unused lumen    [provider]  Valproate Sodium (DEPAKENE) 250 MG/5ML SOLN solution Place 5 mLs (250 mg total) into feeding tube 2 (two) times daily. 06/19/18   Cherene Altes, MD  Water For Irrigation, Sterile (FREE WATER) SOLN Place 300 mLs into feeding tube every 4 (four) hours. 10/04/18   Erick Colace, NP    Family History Family History  Problem Relation Age of Onset  . Stomach cancer Mother   . CVA Father   . Bladder Cancer Neg Hx   . Kidney cancer Neg Hx     Social History Social History   Tobacco Use  . Smoking status: Former Smoker    Packs/day: 1.00    Years: 40.00    Pack years: 40.00    Quit date: 06/13/1998    Years since quitting: 20.4  . Smokeless tobacco: Never Used  Substance Use Topics   . Alcohol use: No  . Drug use: No     Allergies   Patient has no known allergies.   Review of Systems Review of Systems  Unable to perform ROS: Patient nonverbal     Physical Exam Updated Vital Signs  ED Triage Vitals  Enc Vitals Group     BP 11/11/18 2200 (!) 95/53     Pulse Rate 11/11/18 2157 (!) 117     Resp 11/11/18 2157 (!) 34     Temp 11/11/18 2157 (!) 101 F (38.3 C)     Temp Source 11/11/18 2157 Rectal     SpO2 11/11/18 2157 96 %     Weight --      Height --      Head Circumference --      Peak Flow --      Pain Score --      Pain Loc --      Pain Edu? --      Excl. in [place]? --     Physical Exam Vitals signs and nursing note reviewed.  Constitutional:      General: She is in acute distress.     Appearance: She is well-developed. She is ill-appearing.  HENT:     Head: Normocephalic and atraumatic.     Nose: Nose normal.     Mouth/Throat:     Mouth: Mucous membranes are dry.  Eyes:     Conjunctiva/sclera: Conjunctivae normal.     Comments: Sluggish pupils bilaterally  Neck:     Musculoskeletal: Neck supple.  Cardiovascular:     Rate and Rhythm: Normal rate and regular rhythm.     Pulses: Normal pulses.     Heart sounds: Normal heart sounds. No murmur.  Pulmonary:     Effort: Respiratory distress present.     Breath sounds: Rhonchi and rales present.     Comments: Tracheostomy in place, vent settings at 60% FiO2, poor air movement Abdominal:     Palpations: Abdomen is soft.     Tenderness: There is no abdominal tenderness.  Skin:    General: Skin is warm and dry.     Comments: Large sacral ulcer with purulence  Neurological:     GCS: GCS eye subscore is 1. GCS verbal subscore is 1. GCS motor subscore is 4.     Comments: Patient grimaces to  pain, does not open eyes spontaneously does not move extremities      ED Treatments / Results  Labs (all labs ordered are listed, but only abnormal results are displayed) Labs Reviewed  LACTIC ACID,  PLASMA - Abnormal; Notable for the following components:      Result Value   Lactic Acid, Venous 9.1 (*)    All other components within normal limits  COMPREHENSIVE METABOLIC PANEL - Abnormal; Notable for the following components:   Sodium 130 (*)    CO2 13 (*)    Glucose, Bld 125 (*)    BUN 57 (*)    Creatinine, Ser 1.68 (*)    Total Protein 6.2 (*)    Albumin 2.0 (*)    AST 216 (*)    ALT 163 (*)    Alkaline Phosphatase 180 (*)    Total Bilirubin 1.5 (*)    GFR calc non Af Amer 28 (*)    GFR calc Af Amer 33 (*)    Anion gap 19 (*)    All other components within normal limits  CBC WITH DIFFERENTIAL/PLATELET - Abnormal; Notable for the following components:   WBC 45.6 (*)    Hemoglobin 11.7 (*)    MCHC 29.8 (*)    RDW 17.6 (*)    Neutro Abs 41.7 (*)    Monocytes Absolute 1.2 (*)    Basophils Absolute 0.2 (*)    Abs Immature Granulocytes 1.33 (*)    All other components within normal limits  URINALYSIS, ROUTINE W REFLEX MICROSCOPIC - Abnormal; Notable for the following components:   APPearance TURBID (*)    Hgb urine dipstick LARGE (*)    Bilirubin Urine SMALL (*)    Protein, ur >300 (*)    Leukocytes,Ua MODERATE (*)    All other components within normal limits  URINALYSIS, MICROSCOPIC (REFLEX) - Abnormal; Notable for the following components:   Bacteria, UA FEW (*)    All other components within normal limits  POCT I-STAT EG7 - Abnormal; Notable for the following components:   pH, Ven 7.434 (*)    pCO2, Ven 20.1 (*)    pO2, Ven 132.0 (*)    Bicarbonate 13.5 (*)    TCO2 14 (*)    Acid-base deficit 9.0 (*)    Sodium 130 (*)    All other components within normal limits  SARS CORONAVIRUS 2 (HOSPITAL ORDER, [facility])  CULTURE, BLOOD (ROUTINE X 2)  CULTURE, BLOOD (ROUTINE X 2)  URINE CULTURE  EXPECTORATED SPUTUM ASSESSMENT W REFEX TO RESP CULTURE  CULTURE, RESPIRATORY  BLOOD GAS, VENOUS  LACTIC ACID, PLASMA  MAGNESIUM  PHOSPHORUS   LACTIC ACID, PLASMA  LACTIC ACID, PLASMA  BLOOD GAS, VENOUS  CBC  MAGNESIUM  PHOSPHORUS  COMPREHENSIVE METABOLIC PANEL  PHENYTOIN LEVEL, TOTAL  VALPROIC ACID LEVEL    EKG EKG Interpretation  Date/Time:  Saturday November 11 2018 22:10:05 EDT Ventricular Rate:  106 PR Interval:    QRS Duration: 88 QT Interval:  385 QTC Calculation: 512 R Axis:   -2 Text Interpretation:  Sinus tachycardia Probable inferior infarct, old Anterior infarct, old Prolonged QT interval Confirmed by Lennice Sites [phone]) on 11/11/2018 10:54:03 PM   Radiology Dg Chest Port 1 View  Result Date: 11/11/2018 CLINICAL DATA:  Fever. EXAM: PORTABLE CHEST 1 VIEW COMPARISON:  Most recent radiograph 10/02/2018. Most recent CT 09/27/2018 FINDINGS: Tracheostomy tube tip 4 cm from the carina. Left upper extremity PICC in the mid SVC. Improvement in bilateral multifocal  airspace disease with mild residual streaky opacities at the bases. Unchanged heart size and mediastinal contours. Aortic atherosclerosis. Mitral annulus calcifications. Prior right chest tube is been removed, no visualized pneumothorax. No large pleural effusion. IMPRESSION: 1. Improvement in bilateral multifocal airspace disease over the past 5 weeks with mild residual streaky opacities at the bases, likely atelectasis or scarring. 2. Tracheostomy tube and left upper extremity PICC remain in place. Electronically Signed   By: Keith Rake M.D.   On: 11/11/2018 23:25    Procedures .Critical Care Performed by: Lennice Sites, DO Authorized by: Lennice Sites, DO   Critical care provider statement:    Critical care time (minutes):  45   Critical care was necessary to treat or prevent imminent or life-threatening deterioration of the following conditions:  Sepsis and shock   Critical care was time spent personally by me on the following activities:  Development of treatment plan with patient or surrogate, blood draw for specimens, evaluation of patient's  response to treatment, examination of patient, obtaining history from patient or surrogate, ordering and performing treatments and interventions, ordering and review of laboratory studies, ordering and review of radiographic studies, pulse oximetry, re-evaluation of patient's condition and review of old charts   I assumed direction of critical care for this patient from another provider in my specialty: no     (including critical care time)  Medications Ordered in ED Medications  meropenem (MERREM) 2 g in sodium chloride 0.9 % 100 mL IVPB (has no administration in time range)  vancomycin (VANCOCIN) IVPB 1000 mg/200 mL premix (1,000 mg Intravenous New Bag/Given 11/11/18 2321)  aspirin chewable tablet 81 mg (has no administration in time range)  midodrine (PROAMATINE) tablet 5 mg (has no administration in time range)  pantoprazole sodium (PROTONIX) 40 mg/20 mL oral suspension 40 mg (has no administration in time range)  levETIRAcetam (KEPPRA) 100 MG/ML solution 1,000 mg (has no administration in time range)  phenytoin (DILANTIN) injection 100 mg (has no administration in time range)  Valproate Sodium (DEPAKENE) solution 250 mg (has no administration in time range)  multivitamin liquid 15 mL (has no administration in time range)  enoxaparin (LOVENOX) injection 30 mg (has no administration in time range)  acetaminophen (TYLENOL) tablet 650 mg (has no administration in time range)  ipratropium-albuterol (DUONEB) 0.5-2.5 (3) MG/3ML nebulizer solution 3 mL (has no administration in time range)  lactated ringers bolus 1,000 mL (0 mLs Intravenous Stopped 11/11/18 2314)  acetaminophen (TYLENOL) suppository 650 mg (650 mg Rectal Given 11/11/18 2212)  lactated ringers bolus 1,000 mL (1,000 mLs Intravenous New Bag/Given 11/11/18 2314)  lactated ringers bolus 1,000 mL (1,000 mLs Intravenous New Bag/Given 11/11/18 2321)     Initial Impression / Assessment and Plan / ED Course  I have reviewed the triage vital  signs and the nursing notes.  Pertinent labs & imaging results that were available during my care of the patient were reviewed by me and considered in my medical decision making (see chart for details).     VERSA CRATON is an 83 year old female status post PEA arrest in the past with chronic trach on vent with feeding tube and chronic indwelling Foley catheter who presents from [place] home with decreased mental status.  Patient found to be hypotensive, febrile, tachycardic.  Concern for septic shock.  Patient given 30 cc/kg of fluid.  Empirically given IV antibiotics upon pharmacy recommendations.  Patient with a white count of 45.  Lactic acid of 9.1.  Creatinine of 1.68.  Evidence of possibly shock liver as well.  Patient is on home vent settings.  Urinalysis concerning for infectious source.  Patient also has significant sacral decubitus ulcer which could also be the source. COVID test is negative.  Chest x-ray overall shows improvement of multifocal pneumonia.  EKG shows sinus tachycardia.  Patient with improved hemodynamics after IV fluids.  IV antibiotics have been started.  Patient is still full code.  Patient admitted to ICU service for further care.  Unable to get in touch with family.  Suspect septic shock likely from UTI versus sacral ulcer.  This chart was dictated using voice recognition software.  Despite best efforts to proofread,  errors can occur which can change the documentation meaning.    Final Clinical Impressions(s) / ED Diagnoses   Final diagnoses:  Septic shock [facility])    ED Discharge Orders    None       Lennice Sites, DO 11/12/18 0006    Lennice Sites, DO 11/12/18 0010

## 2018-11-12 ENCOUNTER — Inpatient Hospital Stay (HOSPITAL_COMMUNITY): Payer: Medicare Other

## 2018-11-12 ENCOUNTER — Other Ambulatory Visit: Payer: Self-pay

## 2018-11-12 DIAGNOSIS — E43 Unspecified severe protein-calorie malnutrition: Secondary | ICD-10-CM | POA: Diagnosis present

## 2018-11-12 DIAGNOSIS — E876 Hypokalemia: Secondary | ICD-10-CM | POA: Diagnosis not present

## 2018-11-12 DIAGNOSIS — N3090 Cystitis, unspecified without hematuria: Secondary | ICD-10-CM | POA: Diagnosis not present

## 2018-11-12 DIAGNOSIS — R6521 Severe sepsis with septic shock: Secondary | ICD-10-CM

## 2018-11-12 DIAGNOSIS — E872 Acidosis: Secondary | ICD-10-CM

## 2018-11-12 DIAGNOSIS — B964 Proteus (mirabilis) (morganii) as the cause of diseases classified elsewhere: Secondary | ICD-10-CM | POA: Diagnosis not present

## 2018-11-12 DIAGNOSIS — D649 Anemia, unspecified: Secondary | ICD-10-CM | POA: Diagnosis not present

## 2018-11-12 DIAGNOSIS — N178 Other acute kidney failure: Secondary | ICD-10-CM | POA: Diagnosis not present

## 2018-11-12 DIAGNOSIS — A4150 Gram-negative sepsis, unspecified: Secondary | ICD-10-CM | POA: Diagnosis present

## 2018-11-12 DIAGNOSIS — I959 Hypotension, unspecified: Secondary | ICD-10-CM | POA: Diagnosis not present

## 2018-11-12 DIAGNOSIS — L89893 Pressure ulcer of other site, stage 3: Secondary | ICD-10-CM | POA: Diagnosis present

## 2018-11-12 DIAGNOSIS — J181 Lobar pneumonia, unspecified organism: Secondary | ICD-10-CM | POA: Diagnosis not present

## 2018-11-12 DIAGNOSIS — A4159 Other Gram-negative sepsis: Secondary | ICD-10-CM | POA: Diagnosis present

## 2018-11-12 DIAGNOSIS — J9601 Acute respiratory failure with hypoxia: Secondary | ICD-10-CM

## 2018-11-12 DIAGNOSIS — Z7189 Other specified counseling: Secondary | ICD-10-CM | POA: Diagnosis not present

## 2018-11-12 DIAGNOSIS — J9622 Acute and chronic respiratory failure with hypercapnia: Secondary | ICD-10-CM | POA: Diagnosis present

## 2018-11-12 DIAGNOSIS — T83511A Infection and inflammatory reaction due to indwelling urethral catheter, initial encounter: Secondary | ICD-10-CM | POA: Diagnosis present

## 2018-11-12 DIAGNOSIS — N3000 Acute cystitis without hematuria: Secondary | ICD-10-CM | POA: Diagnosis not present

## 2018-11-12 DIAGNOSIS — Z515 Encounter for palliative care: Secondary | ICD-10-CM

## 2018-11-12 DIAGNOSIS — R509 Fever, unspecified: Secondary | ICD-10-CM | POA: Diagnosis not present

## 2018-11-12 DIAGNOSIS — R402212 Coma scale, best verbal response, none, at arrival to emergency department: Secondary | ICD-10-CM | POA: Diagnosis present

## 2018-11-12 DIAGNOSIS — Z930 Tracheostomy status: Secondary | ICD-10-CM | POA: Diagnosis not present

## 2018-11-12 DIAGNOSIS — G9349 Other encephalopathy: Secondary | ICD-10-CM | POA: Diagnosis present

## 2018-11-12 DIAGNOSIS — L89109 Pressure ulcer of unspecified part of back, unspecified stage: Secondary | ICD-10-CM | POA: Diagnosis not present

## 2018-11-12 DIAGNOSIS — Z95828 Presence of other vascular implants and grafts: Secondary | ICD-10-CM | POA: Diagnosis not present

## 2018-11-12 DIAGNOSIS — L89159 Pressure ulcer of sacral region, unspecified stage: Secondary | ICD-10-CM | POA: Diagnosis not present

## 2018-11-12 DIAGNOSIS — R918 Other nonspecific abnormal finding of lung field: Secondary | ICD-10-CM | POA: Diagnosis not present

## 2018-11-12 DIAGNOSIS — Z431 Encounter for attention to gastrostomy: Secondary | ICD-10-CM | POA: Diagnosis not present

## 2018-11-12 DIAGNOSIS — A419 Sepsis, unspecified organism: Secondary | ICD-10-CM | POA: Diagnosis present

## 2018-11-12 DIAGNOSIS — I5022 Chronic systolic (congestive) heart failure: Secondary | ICD-10-CM | POA: Diagnosis present

## 2018-11-12 DIAGNOSIS — R402112 Coma scale, eyes open, never, at arrival to emergency department: Secondary | ICD-10-CM | POA: Diagnosis not present

## 2018-11-12 DIAGNOSIS — L89153 Pressure ulcer of sacral region, stage 3: Secondary | ICD-10-CM | POA: Diagnosis not present

## 2018-11-12 DIAGNOSIS — R7881 Bacteremia: Secondary | ICD-10-CM | POA: Diagnosis not present

## 2018-11-12 DIAGNOSIS — I700 Atherosclerosis of aorta: Secondary | ICD-10-CM | POA: Diagnosis not present

## 2018-11-12 DIAGNOSIS — Z8673 Personal history of transient ischemic attack (TIA), and cerebral infarction without residual deficits: Secondary | ICD-10-CM | POA: Diagnosis not present

## 2018-11-12 DIAGNOSIS — R740 Nonspecific elevation of levels of transaminase and lactic acid dehydrogenase [LDH]: Secondary | ICD-10-CM | POA: Diagnosis not present

## 2018-11-12 DIAGNOSIS — B961 Klebsiella pneumoniae [K. pneumoniae] as the cause of diseases classified elsewhere: Secondary | ICD-10-CM | POA: Diagnosis not present

## 2018-11-12 DIAGNOSIS — Z430 Encounter for attention to tracheostomy: Secondary | ICD-10-CM | POA: Diagnosis not present

## 2018-11-12 DIAGNOSIS — Z1159 Encounter for screening for other viral diseases: Secondary | ICD-10-CM | POA: Diagnosis not present

## 2018-11-12 DIAGNOSIS — E870 Hyperosmolality and hypernatremia: Secondary | ICD-10-CM | POA: Diagnosis not present

## 2018-11-12 DIAGNOSIS — I2510 Atherosclerotic heart disease of native coronary artery without angina pectoris: Secondary | ICD-10-CM | POA: Diagnosis present

## 2018-11-12 DIAGNOSIS — K5730 Diverticulosis of large intestine without perforation or abscess without bleeding: Secondary | ICD-10-CM | POA: Diagnosis not present

## 2018-11-12 DIAGNOSIS — A498 Other bacterial infections of unspecified site: Secondary | ICD-10-CM | POA: Diagnosis not present

## 2018-11-12 DIAGNOSIS — D638 Anemia in other chronic diseases classified elsewhere: Secondary | ICD-10-CM | POA: Diagnosis not present

## 2018-11-12 DIAGNOSIS — Z1624 Resistance to multiple antibiotics: Secondary | ICD-10-CM | POA: Diagnosis present

## 2018-11-12 DIAGNOSIS — J90 Pleural effusion, not elsewhere classified: Secondary | ICD-10-CM | POA: Diagnosis not present

## 2018-11-12 DIAGNOSIS — J158 Pneumonia due to other specified bacteria: Secondary | ICD-10-CM | POA: Diagnosis not present

## 2018-11-12 DIAGNOSIS — R1312 Dysphagia, oropharyngeal phase: Secondary | ICD-10-CM | POA: Diagnosis not present

## 2018-11-12 DIAGNOSIS — R4182 Altered mental status, unspecified: Secondary | ICD-10-CM | POA: Diagnosis not present

## 2018-11-12 DIAGNOSIS — R579 Shock, unspecified: Secondary | ICD-10-CM | POA: Diagnosis not present

## 2018-11-12 DIAGNOSIS — E871 Hypo-osmolality and hyponatremia: Secondary | ICD-10-CM | POA: Diagnosis present

## 2018-11-12 DIAGNOSIS — J9621 Acute and chronic respiratory failure with hypoxia: Secondary | ICD-10-CM | POA: Diagnosis not present

## 2018-11-12 DIAGNOSIS — D72829 Elevated white blood cell count, unspecified: Secondary | ICD-10-CM | POA: Diagnosis not present

## 2018-11-12 DIAGNOSIS — L89629 Pressure ulcer of left heel, unspecified stage: Secondary | ICD-10-CM | POA: Diagnosis not present

## 2018-11-12 DIAGNOSIS — J9610 Chronic respiratory failure, unspecified whether with hypoxia or hypercapnia: Secondary | ICD-10-CM | POA: Diagnosis not present

## 2018-11-12 DIAGNOSIS — M6281 Muscle weakness (generalized): Secondary | ICD-10-CM | POA: Diagnosis not present

## 2018-11-12 DIAGNOSIS — R402342 Coma scale, best motor response, flexion withdrawal, at arrival to emergency department: Secondary | ICD-10-CM | POA: Diagnosis not present

## 2018-11-12 DIAGNOSIS — E874 Mixed disorder of acid-base balance: Secondary | ICD-10-CM | POA: Diagnosis present

## 2018-11-12 DIAGNOSIS — Z1629 Resistance to other single specified antibiotic: Secondary | ICD-10-CM | POA: Diagnosis not present

## 2018-11-12 DIAGNOSIS — K7200 Acute and subacute hepatic failure without coma: Secondary | ICD-10-CM

## 2018-11-12 DIAGNOSIS — N179 Acute kidney failure, unspecified: Secondary | ICD-10-CM | POA: Diagnosis not present

## 2018-11-12 DIAGNOSIS — J9611 Chronic respiratory failure with hypoxia: Secondary | ICD-10-CM | POA: Diagnosis not present

## 2018-11-12 DIAGNOSIS — N390 Urinary tract infection, site not specified: Secondary | ICD-10-CM | POA: Diagnosis present

## 2018-11-12 DIAGNOSIS — L89619 Pressure ulcer of right heel, unspecified stage: Secondary | ICD-10-CM | POA: Diagnosis not present

## 2018-11-12 DIAGNOSIS — L89154 Pressure ulcer of sacral region, stage 4: Secondary | ICD-10-CM | POA: Diagnosis not present

## 2018-11-12 DIAGNOSIS — J9620 Acute and chronic respiratory failure, unspecified whether with hypoxia or hypercapnia: Secondary | ICD-10-CM | POA: Diagnosis not present

## 2018-11-12 DIAGNOSIS — Z9911 Dependence on respirator [ventilator] status: Secondary | ICD-10-CM | POA: Diagnosis not present

## 2018-11-12 DIAGNOSIS — N200 Calculus of kidney: Secondary | ICD-10-CM | POA: Diagnosis not present

## 2018-11-12 DIAGNOSIS — R197 Diarrhea, unspecified: Secondary | ICD-10-CM | POA: Diagnosis not present

## 2018-11-12 LAB — COMPREHENSIVE METABOLIC PANEL
ALT: 433 U/L — ABNORMAL HIGH (ref 0–44)
ALT: 340 U/L — ABNORMAL HIGH (ref 0–44)
AST: 558 U/L — ABNORMAL HIGH (ref 15–41)
AST: 382 U/L — ABNORMAL HIGH (ref 15–41)
Albumin: 2.2 g/dL — ABNORMAL LOW (ref 3.5–5.0)
Albumin: 1.6 g/dL — ABNORMAL LOW (ref 3.5–5.0)
Alkaline Phosphatase: 180 U/L — ABNORMAL HIGH (ref 38–126)
Alkaline Phosphatase: 110 U/L (ref 38–126)
Anion gap: 19 — ABNORMAL HIGH (ref 5–15)
Anion gap: 16 — ABNORMAL HIGH (ref 5–15)
BUN: 57 mg/dL — ABNORMAL HIGH (ref 8–23)
BUN: 59 mg/dL — ABNORMAL HIGH (ref 8–23)
CO2: 16 mmol/L — ABNORMAL LOW (ref 22–32)
CO2: 14 mmol/L — ABNORMAL LOW (ref 22–32)
Calcium: 10.2 mg/dL (ref 8.9–10.3)
Calcium: 8.7 mg/dL — ABNORMAL LOW (ref 8.9–10.3)
Chloride: 97 mmol/L — ABNORMAL LOW (ref 98–111)
Chloride: 105 mmol/L (ref 98–111)
Creatinine, Ser: 1.49 mg/dL — ABNORMAL HIGH (ref 0.44–1.00)
Creatinine, Ser: 1.55 mg/dL — ABNORMAL HIGH (ref 0.44–1.00)
GFR calc Af Amer: 38 mL/min — ABNORMAL LOW (ref >60)
GFR calc Af Amer: 36 mL/min — ABNORMAL LOW (ref >60)
GFR calc non Af Amer: 33 mL/min — ABNORMAL LOW (ref >60)
GFR calc non Af Amer: 31 mL/min — ABNORMAL LOW (ref >60)
Glucose, Bld: 101 mg/dL — ABNORMAL HIGH (ref 70–99)
Glucose, Bld: 165 mg/dL — ABNORMAL HIGH (ref 70–99)
Potassium: 5.4 mmol/L — ABNORMAL HIGH (ref 3.5–5.1)
Potassium: 5.1 mmol/L (ref 3.5–5.1)
Sodium: 132 mmol/L — ABNORMAL LOW (ref 135–145)
Sodium: 135 mmol/L (ref 135–145)
Total Bilirubin: 1.9 mg/dL — ABNORMAL HIGH (ref 0.3–1.2)
Total Bilirubin: 1.4 mg/dL — ABNORMAL HIGH (ref 0.3–1.2)
Total Protein: 6.7 g/dL (ref 6.5–8.1)
Total Protein: 5.3 g/dL — ABNORMAL LOW (ref 6.5–8.1)

## 2018-11-12 LAB — C DIFFICILE QUICK SCREEN W PCR REFLEX
C Diff antigen: NEGATIVE
C Diff interpretation: NOT DETECTED
C Diff toxin: NEGATIVE

## 2018-11-12 LAB — CBC
HCT: 41.5 % (ref 36.0–46.0)
HCT: 33.1 % — ABNORMAL LOW (ref 36.0–46.0)
Hemoglobin: 12.5 g/dL (ref 12.0–15.0)
Hemoglobin: 10.1 g/dL — ABNORMAL LOW (ref 12.0–15.0)
MCH: 28.7 pg (ref 26.0–34.0)
MCH: 28.4 pg (ref 26.0–34.0)
MCHC: 30.1 g/dL (ref 30.0–36.0)
MCHC: 30.5 g/dL (ref 30.0–36.0)
MCV: 95.4 fL (ref 80.0–100.0)
MCV: 93 fL (ref 80.0–100.0)
Platelets: 338 10*3/uL (ref 150–400)
Platelets: 298 10*3/uL (ref 150–400)
RBC: 4.35 MIL/uL (ref 3.87–5.11)
RBC: 3.56 MIL/uL — ABNORMAL LOW (ref 3.87–5.11)
RDW: 17.7 % — ABNORMAL HIGH (ref 11.5–15.5)
RDW: 17.7 % — ABNORMAL HIGH (ref 11.5–15.5)
WBC: 23 10*3/uL — ABNORMAL HIGH (ref 4.0–10.5)
WBC: 20.3 10*3/uL — ABNORMAL HIGH (ref 4.0–10.5)
nRBC: 0 % (ref 0.0–0.2)
nRBC: 0 % (ref 0.0–0.2)

## 2018-11-12 LAB — MAGNESIUM
Magnesium: 2.5 mg/dL — ABNORMAL HIGH (ref 1.7–2.4)
Magnesium: 2.4 mg/dL (ref 1.7–2.4)

## 2018-11-12 LAB — BLOOD CULTURE ID PANEL (REFLEXED)
Acinetobacter baumannii: NOT DETECTED
Candida albicans: NOT DETECTED
Candida glabrata: NOT DETECTED
Candida krusei: NOT DETECTED
Candida parapsilosis: NOT DETECTED
Candida tropicalis: NOT DETECTED
Carbapenem resistance: DETECTED — AB
Enterobacter cloacae complex: NOT DETECTED
Enterobacteriaceae species: DETECTED — AB
Enterococcus species: NOT DETECTED
Escherichia coli: NOT DETECTED
Haemophilus influenzae: NOT DETECTED
Klebsiella oxytoca: NOT DETECTED
Klebsiella pneumoniae: DETECTED — AB
Listeria monocytogenes: NOT DETECTED
Neisseria meningitidis: NOT DETECTED
Proteus species: NOT DETECTED
Pseudomonas aeruginosa: NOT DETECTED
Serratia marcescens: NOT DETECTED
Staphylococcus aureus (BCID): NOT DETECTED
Staphylococcus species: NOT DETECTED
Streptococcus agalactiae: NOT DETECTED
Streptococcus pneumoniae: NOT DETECTED
Streptococcus pyogenes: NOT DETECTED
Streptococcus species: NOT DETECTED

## 2018-11-12 LAB — GLUCOSE, CAPILLARY
Glucose-Capillary: 116 mg/dL — ABNORMAL HIGH (ref 70–99)
Glucose-Capillary: 152 mg/dL — ABNORMAL HIGH (ref 70–99)
Glucose-Capillary: 158 mg/dL — ABNORMAL HIGH (ref 70–99)
Glucose-Capillary: 162 mg/dL — ABNORMAL HIGH (ref 70–99)
Glucose-Capillary: 73 mg/dL (ref 70–99)
Glucose-Capillary: 75 mg/dL (ref 70–99)
Glucose-Capillary: 97 mg/dL (ref 70–99)

## 2018-11-12 LAB — LACTIC ACID, PLASMA
Lactic Acid, Venous: 9.5 mmol/L (ref 0.5–1.9)
Lactic Acid, Venous: 9.1 mmol/L (ref 0.5–1.9)
Lactic Acid, Venous: 4.2 mmol/L (ref 0.5–1.9)
Lactic Acid, Venous: 4.5 mmol/L (ref 0.5–1.9)

## 2018-11-12 LAB — SALICYLATE LEVEL: Salicylate Lvl: <7 mg/dL (ref 2.8–30.0)

## 2018-11-12 LAB — HEPATITIS PANEL, ACUTE
HCV Ab: 0.1 s/co ratio (ref 0.0–0.9)
Hep A IgM: NEGATIVE
Hep B C IgM: NEGATIVE
Hepatitis B Surface Ag: NEGATIVE

## 2018-11-12 LAB — PROTIME-INR
INR: 1.7 — ABNORMAL HIGH (ref 0.8–1.2)
Prothrombin Time: 19.6 seconds — ABNORMAL HIGH (ref 11.4–15.2)

## 2018-11-12 LAB — ACETAMINOPHEN LEVEL: Acetaminophen (Tylenol), Serum: <10 ug/mL — ABNORMAL LOW (ref 10–30)

## 2018-11-12 LAB — PHOSPHORUS
Phosphorus: 4 mg/dL (ref 2.5–4.6)
Phosphorus: 3.3 mg/dL (ref 2.5–4.6)

## 2018-11-12 LAB — TROPONIN I (HIGH SENSITIVITY): Troponin I (High Sensitivity): 154 ng/L (ref <18)

## 2018-11-12 LAB — PHENYTOIN LEVEL, TOTAL: Phenytoin Lvl: 10.4 ug/mL (ref 10.0–20.0)

## 2018-11-12 LAB — VALPROIC ACID LEVEL: Valproic Acid Lvl: <10 ug/mL — ABNORMAL LOW (ref 50.0–100.0)

## 2018-11-12 MED ORDER — LEVETIRACETAM IN NACL 1000 MG/100ML IV SOLN: 1000.0000 mg | Freq: Two times a day (BID) | INTRAVENOUS | Status: DC

## 2018-11-12 MED ORDER — SODIUM CHLORIDE 0.9 % IV SOLN
1.0000 g | Freq: Two times a day (BID) | INTRAVENOUS | Status: DC
  Administered 2018-11-12: 1 g via INTRAVENOUS
  Filled 2018-11-12 (×2): qty 1

## 2018-11-12 MED ORDER — CHLORHEXIDINE GLUCONATE CLOTH 2 % EX PADS
6.0000 | MEDICATED_PAD | Freq: Every day | CUTANEOUS | Status: DC
  Administered 2018-11-12 – 2018-11-23 (×10): 6 via TOPICAL

## 2018-11-12 MED ORDER — SODIUM CHLORIDE 0.9 % IV SOLN
150.0000 mg | Freq: Every day | INTRAVENOUS | Status: DC
  Administered 2018-11-12 – 2018-11-16 (×5): 150 mg via INTRAVENOUS
  Filled 2018-11-12 (×7): qty 3

## 2018-11-12 MED ORDER — DEXTROSE 5 % IV SOLN
0.9400 g | Freq: Two times a day (BID) | INTRAVENOUS | Status: DC
  Administered 2018-11-12 – 2018-11-13 (×2): 0.94 g via INTRAVENOUS
  Filled 2018-11-12 (×4): qty 4.51

## 2018-11-12 MED ORDER — LEVETIRACETAM 100 MG/ML PO SOLN
1000.0000 mg | Freq: Two times a day (BID) | ORAL | Status: DC
  Administered 2018-11-12: 03:00:00 1000 mg
  Filled 2018-11-12 (×2): qty 10

## 2018-11-12 MED ORDER — ACETAMINOPHEN 325 MG PO TABS: 650.0000 mg | ORAL_TABLET | ORAL | Status: DC | PRN

## 2018-11-12 MED ORDER — VALPROIC ACID 250 MG/5ML PO SOLN
250.0000 mg | Freq: Two times a day (BID) | ORAL | Status: DC
  Administered 2018-11-12: 250 mg
  Filled 2018-11-12 (×2): qty 5

## 2018-11-12 MED ORDER — SODIUM CHLORIDE 0.9% FLUSH: 10.0000 mL | INTRAVENOUS | Status: DC | PRN

## 2018-11-12 MED ORDER — ADULT MULTIVITAMIN LIQUID CH
15.0000 mL | Freq: Every day | ORAL | Status: DC
  Administered 2018-11-13 – 2018-11-29 (×17): 15 mL
  Filled 2018-11-12 (×17): qty 15

## 2018-11-12 MED ORDER — NOREPINEPHRINE 4 MG/250ML-% IV SOLN
0.0000 ug/min | INTRAVENOUS | Status: DC
  Administered 2018-11-12: 2 ug/min via INTRAVENOUS
  Administered 2018-11-12: 30 ug/min via INTRAVENOUS
  Administered 2018-11-12: 5 ug/min via INTRAVENOUS
  Administered 2018-11-13: 3 ug/min via INTRAVENOUS
  Administered 2018-11-13: 4 ug/min via INTRAVENOUS
  Filled 2018-11-12 (×6): qty 250

## 2018-11-12 MED ORDER — VASOPRESSIN 20 UNIT/ML IV SOLN
0.0400 [IU]/min | INTRAVENOUS | Status: DC
  Administered 2018-11-12 – 2018-11-13 (×2): 0.04 [IU]/min via INTRAVENOUS
  Filled 2018-11-12 (×2): qty 2

## 2018-11-12 MED ORDER — ORAL CARE MOUTH RINSE
15.0000 mL | OROMUCOSAL | Status: DC
  Administered 2018-11-12 – 2018-11-29 (×167): 15 mL via OROMUCOSAL

## 2018-11-12 MED ORDER — ALTEPLASE 2 MG IJ SOLR
2.0000 mg | Freq: Once | INTRAMUSCULAR | Status: AC
  Administered 2018-11-12: 08:00:00 2 mg

## 2018-11-12 MED ORDER — VANCOMYCIN HCL 10 G IV SOLR: 1250.0000 mg | INTRAVENOUS | Status: DC

## 2018-11-12 MED ORDER — VALPROATE SODIUM 500 MG/5ML IV SOLN
125.0000 mg | Freq: Four times a day (QID) | INTRAVENOUS | Status: DC
  Administered 2018-11-12 – 2018-11-21 (×35): 125 mg via INTRAVENOUS
  Filled 2018-11-12 (×38): qty 1.25

## 2018-11-12 MED ORDER — MIDODRINE HCL 5 MG PO TABS
5.0000 mg | ORAL_TABLET | Freq: Three times a day (TID) | ORAL | Status: DC
  Administered 2018-11-13 – 2018-11-29 (×50): 5 mg
  Filled 2018-11-12 (×51): qty 1

## 2018-11-12 MED ORDER — VANCOMYCIN VARIABLE DOSE PER UNSTABLE RENAL FUNCTION (PHARMACIST DOSING): Status: DC

## 2018-11-12 MED ORDER — LEVETIRACETAM IN NACL 500 MG/100ML IV SOLN
500.0000 mg | Freq: Two times a day (BID) | INTRAVENOUS | Status: DC
  Administered 2018-11-12 – 2018-11-21 (×17): 500 mg via INTRAVENOUS
  Filled 2018-11-12 (×18): qty 100

## 2018-11-12 MED ORDER — ASPIRIN 81 MG PO CHEW
81.0000 mg | CHEWABLE_TABLET | Freq: Every day | ORAL | Status: DC
  Administered 2018-11-13 – 2018-11-29 (×16): 81 mg
  Filled 2018-11-12 (×16): qty 1

## 2018-11-12 MED ORDER — INSULIN ASPART 100 UNIT/ML IV SOLN
10.0000 [IU] | Freq: Once | INTRAVENOUS | Status: AC
  Administered 2018-11-12: 10 [IU] via INTRAVENOUS

## 2018-11-12 MED ORDER — DEXTROSE 50 % IV SOLN
50.0000 mL | Freq: Once | INTRAVENOUS | Status: AC
  Administered 2018-11-12: 50 mL via INTRAVENOUS
  Filled 2018-11-12: qty 50

## 2018-11-12 MED ORDER — BISACODYL 10 MG RE SUPP: 10.0000 mg | Freq: Every day | RECTAL | Status: DC | PRN

## 2018-11-12 MED ORDER — LACTATED RINGERS IV BOLUS
1000.0000 mL | Freq: Once | INTRAVENOUS | Status: AC
  Administered 2018-11-12: 1000 mL via INTRAVENOUS

## 2018-11-12 MED ORDER — CHLORHEXIDINE GLUCONATE 0.12% ORAL RINSE (MEDLINE KIT)
15.0000 mL | Freq: Two times a day (BID) | OROMUCOSAL | Status: DC
  Administered 2018-11-12 – 2018-11-29 (×34): 15 mL via OROMUCOSAL

## 2018-11-12 MED ORDER — ENOXAPARIN SODIUM 30 MG/0.3ML ~~LOC~~ SOLN
30.0000 mg | SUBCUTANEOUS | Status: DC
  Administered 2018-11-12 – 2018-11-14 (×3): 30 mg via SUBCUTANEOUS
  Filled 2018-11-12 (×3): qty 0.3

## 2018-11-12 MED ORDER — PANTOPRAZOLE SODIUM 40 MG PO PACK: 40.0000 mg | PACK | Freq: Every day | ORAL | Status: DC

## 2018-11-12 MED ORDER — FENTANYL CITRATE (PF) 100 MCG/2ML IJ SOLN
25.0000 ug | INTRAMUSCULAR | Status: AC | PRN
  Administered 2018-11-19 – 2018-11-21 (×3): 25 ug via INTRAVENOUS
  Filled 2018-11-12 (×4): qty 2

## 2018-11-12 MED ORDER — PHENYTOIN SODIUM 50 MG/ML IJ SOLN
100.0000 mg | Freq: Two times a day (BID) | INTRAMUSCULAR | Status: DC
  Administered 2018-11-12 – 2018-11-16 (×10): 100 mg via INTRAVENOUS
  Filled 2018-11-12 (×10): qty 2

## 2018-11-12 MED ORDER — PANTOPRAZOLE SODIUM 40 MG IV SOLR
40.0000 mg | INTRAVENOUS | Status: DC
  Administered 2018-11-12 – 2018-11-13 (×2): 40 mg via INTRAVENOUS
  Filled 2018-11-12 (×2): qty 40

## 2018-11-12 MED ORDER — FENTANYL CITRATE (PF) 100 MCG/2ML IJ SOLN
25.0000 ug | INTRAMUSCULAR | Status: DC | PRN
  Administered 2018-11-17: 100 ug via INTRAVENOUS
  Administered 2018-11-18: 25 ug via INTRAVENOUS
  Administered 2018-11-18: 50 ug via INTRAVENOUS
  Administered 2018-11-18: 100 ug via INTRAVENOUS
  Administered 2018-11-19: 50 ug via INTRAVENOUS
  Administered 2018-11-19 (×2): 75 ug via INTRAVENOUS
  Administered 2018-11-19: 50 ug via INTRAVENOUS
  Administered 2018-11-19: 100 ug via INTRAVENOUS
  Administered 2018-11-19: 09:00:00 75 ug via INTRAVENOUS
  Administered 2018-11-20 – 2018-11-21 (×3): 100 ug via INTRAVENOUS
  Administered 2018-11-22 – 2018-11-23 (×2): 50 ug via INTRAVENOUS
  Administered 2018-11-24 – 2018-11-27 (×11): 100 ug via INTRAVENOUS
  Administered 2018-11-28: 50 ug via INTRAVENOUS
  Administered 2018-11-28: 100 ug via INTRAVENOUS
  Administered 2018-11-28: 50 ug via INTRAVENOUS
  Filled 2018-11-12 (×30): qty 2

## 2018-11-12 MED ORDER — CHLORHEXIDINE GLUCONATE CLOTH 2 % EX PADS: 6.0000 | MEDICATED_PAD | Freq: Every day | CUTANEOUS | Status: DC

## 2018-11-12 MED ORDER — SODIUM CHLORIDE 0.9 % IV SOLN: 1.0000 g | INTRAVENOUS | Status: DC

## 2018-11-12 MED ORDER — IPRATROPIUM-ALBUTEROL 0.5-2.5 (3) MG/3ML IN SOLN: 3.0000 mL | Freq: Four times a day (QID) | RESPIRATORY_TRACT | Status: DC | PRN

## 2018-11-12 MED ORDER — HYDROCORTISONE NA SUCCINATE PF 100 MG IJ SOLR
50.0000 mg | Freq: Four times a day (QID) | INTRAMUSCULAR | Status: DC
  Administered 2018-11-12 – 2018-11-13 (×5): 50 mg via INTRAVENOUS
  Filled 2018-11-12 (×5): qty 2

## 2018-11-12 MED ORDER — VANCOMYCIN 50 MG/ML ORAL SOLUTION: 500.0000 mg | Freq: Four times a day (QID) | ORAL | Status: DC

## 2018-11-12 MED ORDER — METRONIDAZOLE IN NACL 5-0.79 MG/ML-% IV SOLN: 500.0000 mg | Freq: Three times a day (TID) | INTRAVENOUS | Status: DC

## 2018-11-12 MED ORDER — DOCUSATE SODIUM 50 MG/5ML PO LIQD: 100.0000 mg | Freq: Two times a day (BID) | ORAL | Status: DC | PRN

## 2018-11-12 MED ORDER — VALPROATE SODIUM 500 MG/5ML IV SOLN: 250.0000 mg | Freq: Two times a day (BID) | INTRAVENOUS | Status: DC

## 2018-11-12 NOTE — Progress Notes (Signed)
PHARMACY - PHYSICIAN COMMUNICATION CRITICAL VALUE ALERT - BLOOD CULTURE IDENTIFICATION (BCID)  Melissa Cochran is an 83 y.o. female who presented to [facility] on 11/11/2018 with a chief complaint of septic shock.  Assessment:  32 yoF admitted with septic shock, now growing KPC-positive Kleb pneumo in 1 of 2 blood cultures. Pt noted to have AKI (CrCl ~27). Per BCID protocol, will transition to ceftazidime/avibactam 0.94g IV q12h.   Name of physician (or Provider) Contacted: Dr. Oletta Darter (CCM)  Current antibiotics: vancomycin + meropenem  Changes to prescribed antibiotics recommended:  Recommendations accepted by provider  Results for orders placed or performed during the hospital encounter of 11/11/18  Blood Culture ID Panel (Reflexed) (Collected: 11/11/2018 10:37 PM)  Result Value Ref Range   Enterococcus species NOT DETECTED NOT DETECTED   Listeria monocytogenes NOT DETECTED NOT DETECTED   Staphylococcus species NOT DETECTED NOT DETECTED   Staphylococcus aureus (BCID) NOT DETECTED NOT DETECTED   Streptococcus species NOT DETECTED NOT DETECTED   Streptococcus agalactiae NOT DETECTED NOT DETECTED   Streptococcus pneumoniae NOT DETECTED NOT DETECTED   Streptococcus pyogenes NOT DETECTED NOT DETECTED   Acinetobacter baumannii NOT DETECTED NOT DETECTED   Enterobacteriaceae species DETECTED (A) NOT DETECTED   Enterobacter cloacae complex NOT DETECTED NOT DETECTED   Escherichia coli NOT DETECTED NOT DETECTED   Klebsiella oxytoca NOT DETECTED NOT DETECTED   Klebsiella pneumoniae DETECTED (A) NOT DETECTED   Proteus species NOT DETECTED NOT DETECTED   Serratia marcescens NOT DETECTED NOT DETECTED   Carbapenem resistance DETECTED (A) NOT DETECTED   Haemophilus influenzae NOT DETECTED NOT DETECTED   Neisseria meningitidis NOT DETECTED NOT DETECTED   Pseudomonas aeruginosa NOT DETECTED NOT DETECTED   Candida albicans NOT DETECTED NOT DETECTED   Candida glabrata NOT DETECTED NOT DETECTED   Candida krusei NOT DETECTED NOT DETECTED   Candida parapsilosis NOT DETECTED NOT DETECTED   Candida tropicalis NOT DETECTED NOT DETECTED     Arrie Senate, PharmD, BCPS Clinical Pharmacist [phone] Please check AMION for all [place] numbers 11/12/2018

## 2018-11-12 NOTE — Progress Notes (Signed)
Pharmacy Antibiotic Note & Phenytoin Dosing Consult  AMEILIA RATTAN is a 83 y.o. female admitted on 11/11/2018 with concern for urosepsis. Noted hx MDR/ESBL organisms. Pharmacy has been consulted for  Vancomycin and Meropenem dosing.   The patient was transferred from [facility] and did receive Vancomycin 1g and Cefepime 1g yesterday. The patient received an additional 1g of Vancomycin here for a total of 2g yesterday.  The patient is noted to be in AKI with SCr 1.49 (BL 0.5), est CrCl<15 ml/min. Will dose antibiotics conservatively - no standing Vancomycin for now.   The patient was also on phenytoin PTA was the dose at discharge last admission was 100 mg in the AM/afternoon and 150 mg at bedtime. The RN at [facility] states that pt was on 500 mg PT bid PTA - which seems abnormally high. Discussed with CCM and will start lower and titrate back up. Noted corrected DPH level on admit was wnl.   Plan: - Hold Vancomycin - Will plan to check a VR to determine need for additional doses - Adjust Meropenem to 1g IV every 24 hours - Adjust Phenytoin to 100 mg q am/afternoon with 150 mg hs - Will check levels at steady state - Will continue to follow renal function, culture results, LOT, and antibiotic de-escalation plans   Height: 5\' 7"  (170.2 cm) Weight: 160 lb 4.4 oz (72.7 kg) IBW/kg (Calculated) : 61.6  Temp (24hrs), Avg:100.2 F (37.9 C), Min:97.4 F (36.3 C), Max:102.3 F (39.1 C)  Recent Labs  Lab 11/11/18 2236 11/11/18 2237 11/12/18 0254 11/12/18 0641  WBC 45.6*  --  23.0*  --   CREATININE 1.68*  --  1.49*  --   LATICACIDVEN  --  9.1* 9.5* 9.1*    Estimated Creatinine Clearance: 28.8 mL/min (A) (by C-G formula based on SCr of 1.49 mg/dL (H)).    No Known Allergies  Antimicrobials this admission: Vanc 7/4 >> - Total of 2g dose on 7/4 (received 1g PTA at [facility]) [facility] 7/5 >>  Dose adjustments this admission: n/a  Microbiology results: 7/4 COVID >> neg 7/4 UCx >> 7/4  BCx >> 7/5 RCx (TA) >> 7/5 CDiff >> neg  Thank you for allowing pharmacy to be a part of this patient's care.  Alycia Rossetti, PharmD, BCPS Clinical Pharmacist Clinical phone for 11/12/2018: [phone] 11/12/2018 10:27 AM   **Pharmacist phone directory can now be found on [email or web address] ([email or web address]).  Listed under [place].

## 2018-11-12 NOTE — Progress Notes (Signed)
CRITICAL VALUE ALERT  Critical Value:  Lactic Acid 4.2  Date & Time Notied:  11/12/2018 @ 1055  Provider Notified: Dr. Valeta Harms  Orders Received/Actions taken: No orders received

## 2018-11-12 NOTE — ED Notes (Signed)
Pt had large watery BM, flexi seal placed

## 2018-11-12 NOTE — Progress Notes (Signed)
NAME:  Melissa Cochran, MRN:  [id number], DOB:  10-Apr-1936, LOS: 0 ADMISSION DATE:  11/11/2018, CONSULTATION DATE: 11/11/2018 REFERRING MD: ED, CHIEF COMPLAINT: Sepsis  Brief History   83 year old woman with history of chronic hypoxic respiratory failure with ventilator/PEG tube dependence, CAD, HFrEF (EF 45-50% 06/2017), paroxysmal atrial fibrillation, adnexal mass, COPD, CVA with chronic encephalopathy, seizure disorder, DVT, GERD, hypertension, recurrent UTI presenting from [facility] with acute on chronic encephalopathy and hypotension found to have UTI.  History of present illness   83 year old woman with history of chronic hypoxic respiratory failure with ventilator/PEG tube dependence, CAD, HFrEF (EF 45-50% 06/2017), paroxysmal atrial fibrillation, adnexal mass, COPD, CVA with chronic encephalopathy, seizure disorder, DVT, GERD, hypertension, recurrent UTI presenting from [facility] with acute on chronic encephalopathy and hypotension.  Per EMR her baseline level of consciousness is eyes open and tracking.  Per daughter, she was corresponding with the [facility] nurses.  There were concerns today for fever, tachycardia.  At one point, she was reported to appear gray.  There was also report of emesis.  Past Medical History  Chronic hypoxic respiratory failure with ventilator/PEG tube dependence, CAD, HFrEF (EF 45-50% 06/2017), paroxysmal atrial fibrillation, adnexal mass, COPD, CVA with chronic encephalopathy, seizure disorder, DVT, GERD, hypertension, recurrent UTI  Significant Hospital Events   7/5> admit  Consults:  none  Procedures:  none  Significant Diagnostic Tests:  CXR 7/4> Improvement in bilateral multifocal airspace disease with mild residual streaky opacities at the bases. Tracheostomy tube and left upper extremity PICC remain in place.   Micro Data:  Resp cx  Blood cx 7/4> Urine cx 7/4> Covid Negative  C diff negative   Antimicrobials:  Vanc 7/4> Meropenem 7/4>  Interim  history/subjective:   Progressive shock state. Increasing vasopressor requirements  Objective   Blood pressure (!) 108/49, pulse (!) 127, temperature (!) 102.3 F (39.1 C), temperature source Axillary, resp. rate (!) 24, height 5\' 7"  (1.702 m), weight 72.7 kg, SpO2 98 %.    Vent Mode: PCV FiO2 (%):  [60 %] 60 % Set Rate:  [20 bmp-30 bmp] 20 bmp PEEP:  [5 cmH20] 5 cmH20 Plateau Pressure:  [26 cmH20-28 cmH20] 26 cmH20   Intake/Output Summary (Last 24 hours) at 11/12/2018 0856 Last data filed at 11/12/2018 0800 Gross per 24 hour  Intake 5189.1 ml  Output 40 ml  Net 5149.1 ml   Filed Weights   11/12/18 0200  Weight: 72.7 kg    Examination: General: chronically ill appearing, trach, vent dependent  HENT: trach in place, minimal secretions  Lungs: BL vented breaths, rhonchi  Cardiovascular: tachycardic, s1 s2, regular Abdomen: distended, no response to palpation, stool coming from PEG Extremities: no significant edema  Neuro: unresponsive to pain and voice    Assessment & Plan:   Septic Shock from presumed urinary tract infection  Possible ischemic bowel  Multiorgan Failure  H/o MDRO  - foley exchanged - follow up cx  - continue vanco and meropenem  - c diff neg - follow LA - Seems to have lactate clearance problems either from generation from ongoing ischemia or liver injury  - continue NEPI  - Added vasopression and shock dose steroids, hydrocortisone  - repeat labs early afternoon  - STAT CT chest/abd /pelvis  - she has a picc, but may need CVC for additional access  Acute on Chronic Respiratory Failure, vent dependent respiratory failure  - remains on full vent support   Hyponatremia  AGMA Lactic Acidosis  Acute renal failure  - all  secondary to above   Transaminitis Acute shock liver - secondary to septic shock as above   Seizure history: -- dilantin, valproic, keppra -- continue pta meds via IV   CAD: - continue ASA   COPD: - continue duonebs     Best practice:  Diet: NPO Pain/Anxiety/Delirium protocol (if indicated): Fentanyl PRN VAP protocol (if indicated): Ordered DVT prophylaxis: SubQ Lovenox GI prophylaxis: Protonix Glucose control: Monitor Mobility: PT when able Code Status: Full code confirmed with daughter Family Communication: I spoke with the patients daughter and encouraged her to come to the hospital. I suspect the patient may pass.  Disposition: ICU  Labs   CBC: Recent Labs  Lab 11/11/18 2236 11/11/18 2245 11/12/18 0254  WBC 45.6*  --  23.0*  NEUTROABS 41.7*  --   --   HGB 11.7* 12.9 12.5  HCT 39.3 38.0 41.5  MCV 96.6  --  95.4  PLT 387  --  782    Basic Metabolic Panel: Recent Labs  Lab 11/11/18 2236 11/11/18 2245 11/12/18 0254  NA 130* 130* 132*  K 5.1 5.1 5.4*  CL 98  --  97*  CO2 13*  --  16*  GLUCOSE 125*  --  101*  BUN 57*  --  57*  CREATININE 1.68*  --  1.49*  CALCIUM 9.8  --  10.2  MG 2.5*  --  2.4  PHOS 4.0  --  3.3   GFR: Estimated Creatinine Clearance: 28.8 mL/min (A) (by C-G formula based on SCr of 1.49 mg/dL (H)). Recent Labs  Lab 11/11/18 2236 11/11/18 2237 11/12/18 0254 11/12/18 0641  WBC 45.6*  --  23.0*  --   LATICACIDVEN  --  9.1* 9.5* 9.1*    Liver Function Tests: Recent Labs  Lab 11/11/18 2236 11/12/18 0254  AST 216* 558*  ALT 163* 433*  ALKPHOS 180* 180*  BILITOT 1.5* 1.9*  PROT 6.2* 6.7  ALBUMIN 2.0* 2.2*   ABG    Component Value Date/Time   PHART 7.470 (H) 09/26/2018 1758   PCO2ART 44.2 09/26/2018 1758   PO2ART 65.0 (L) 09/26/2018 1758   HCO3 13.5 (L) 11/11/2018 2245   TCO2 14 (L) 11/11/2018 2245   ACIDBASEDEF 9.0 (H) 11/11/2018 2245   O2SAT 99.0 11/11/2018 2245     HbA1C: Hgb A1c MFr Bld  Date/Time Value Ref Range Status  02/13/2017 09:35 PM 5.1 4.8 - 5.6 % Final    Comment:    (NOTE) Pre diabetes:          5.7%-6.4% Diabetes:              >6.4% Glycemic control for   <7.0% adults with diabetes     Review of Systems:   Unable  to obtain due to encephalopathy  Past Medical History  She,  has a past medical history of Adnexal mass, Anxiety, Arthritis, Bladder tumor, Bleeding ulcer, CHF (congestive heart failure) ([place]), CHF (congestive heart failure) ([place]), Chronic bronchitis ([place]), Chronic respiratory failure ([place]) (04/19/2018), COPD (chronic obstructive pulmonary disease) ([place]), Coronary artery disease, CVA (cerebral vascular accident) ([place]) (04/19/2018), DVT (deep venous thrombosis) ([place]), GERD (gastroesophageal reflux disease), Heart block, History of bleeding ulcers, History of kidney stones, Hyperlipemia, Hypertension, Myocardial infarction ([place]), Pneumonia, Seizures ([place]) (02/2017 "several"; 06/26/2017 X 1), Shortness of breath dyspnea, and Stroke ([place]) (02/13/2018).   Surgical History    Past Surgical History:  Procedure Laterality Date  . ABDOMINAL HYSTERECTOMY    . CARDIAC CATHETERIZATION Left 10/21/2015   Procedure: Left Heart Cath and  Coronary Angiography;  Surgeon: Isaias Cowman, MD;  Location: [facility];  Service: Cardiovascular;  Laterality: Left;  . CARDIAC CATHETERIZATION N/A 10/21/2015   Procedure: Coronary Stent Intervention;  Surgeon: Isaias Cowman, MD;  Location: [place];  Service: Cardiovascular;  Laterality: N/A;  . CARDIAC CATHETERIZATION    . CYSTOSCOPY W/ RETROGRADES Bilateral 01/20/2016   Procedure: CYSTOSCOPY WITH RETROGRADE PYELOGRAM;  Surgeon: Hollice Espy, MD;  Location: [facility];  Service: Urology;  Laterality: Bilateral;  . CYSTOSCOPY WITH STENT PLACEMENT Right 01/20/2016   Procedure: CYSTOSCOPY WITH STENT PLACEMENT;  Surgeon: Hollice Espy, MD;  Location: [facility];  Service: Urology;  Laterality: Right;  . HIP FRACTURE SURGERY Left   . STOMACH SURGERY     "stomach ulcers"  . TRACHEOSTOMY TUBE PLACEMENT N/A 03/02/2017   Procedure: TRACHEOSTOMY;  Surgeon: Rozetta Nunnery, MD;  Location: [place];  Service: ENT;  Laterality: N/A;  . TRANSURETHRAL  RESECTION OF BLADDER TUMOR N/A 01/20/2016   Procedure: TRANSURETHRAL RESECTION OF BLADDER TUMOR (TURBT);  Surgeon: Hollice Espy, MD;  Location: [facility];  Service: Urology;  Laterality: N/A;     Social History   reports that she quit smoking about 20 years ago. She has a 40.00 pack-year smoking history. She has never used smokeless tobacco. She reports that she does not drink alcohol or use drugs.   Family History   Her family history includes CVA in her father; Stomach cancer in her mother. There is no history of Bladder Cancer or Kidney cancer.   Allergies No Known Allergies   Home Medications  Prior to Admission medications   Medication Sig Start Date End Date Taking? Authorizing Provider  Amino Acids-Protein Hydrolys (FEEDING SUPPLEMENT, PRO-STAT SUGAR FREE 64,) LIQD Place 30 mLs into feeding tube 3 (three) times daily. 10/04/18   Erick Colace, NP  aspirin 81 MG chewable tablet Place 1 tablet (81 mg total) into feeding tube daily. 06/19/18   Cherene Altes, MD  cefTAZidime 2 g in sodium chloride 0.9 % 100 mL Inject 2 g into the vein every 8 (eight) hours. 10/04/18   Erick Colace, NP  chlorhexidine (PERIDEX) 0.12 % solution 5 mLs See admin instructions. 5 ml's for trach care 2 times a day    [provider]  enoxaparin (LOVENOX) 40 MG/0.4ML injection Inject 0.4 mLs (40 mg total) into the skin daily. 10/05/18   Erick Colace, NP  famotidine (PEPCID) 20 MG tablet Place 20 mg into feeding tube daily.     [provider]  HYDROcodone-acetaminophen (NORCO/VICODIN) 5-325 MG tablet Place 1 tablet into feeding tube every 6 (six) hours as needed for moderate pain. 10/04/18   Erick Colace, NP  ipratropium-albuterol (DUONEB) 0.5-2.5 (3) MG/3ML SOLN Take 3 mLs by nebulization as needed (for acute and chronic respiratory failure, unspecified whether with hypoxia or hypercapnia).     [provider]  levETIRAcetam (KEPPRA) 100 MG/ML solution Place 10 mLs (1,000  mg total) into feeding tube 2 (two) times daily. 06/19/18   Cherene Altes, MD  midodrine (PROAMATINE) 5 MG tablet Take 1 tablet (5 mg total) by mouth 3 (three) times daily with meals. 10/04/18   Erick Colace, NP  Multiple Vitamin (MULTIVITAMIN) LIQD Place 15 mLs into feeding tube daily. 10/05/18   Erick Colace, NP  Nutritional Supplements (FEEDING SUPPLEMENT, OSMOLITE 1.5 CAL,) LIQD Place 1,000 mLs into feeding tube continuous. 10/04/18   Erick Colace, NP  nystatin (MYCOSTATIN) 100000 UNIT/ML suspension Take 5  mLs (500,000 Units total) by mouth 4 (four) times daily. 10/04/18   Erick Colace, NP  pantoprazole sodium (PROTONIX) 40 mg/20 mL PACK Place 20 mLs (40 mg total) into feeding tube daily. 10/05/18   Erick Colace, NP  phenytoin (DILANTIN) 50 MG/ML injection Inject 2 mLs (100 mg total) into the vein 2 (two) times daily. 10/05/18   Erick Colace, NP  phenytoin 150 mg in sodium chloride 0.9 % 100 mL Inject 150 mg into the vein at bedtime. 10/04/18   Erick Colace, NP  Sodium Chloride Flush (NORMAL SALINE FLUSH IV) Inject 10 mLs into the vein See admin instructions. Use 10 ml's via IV every shift for any used lumens- flush each unused lumen    [provider]  Valproate Sodium (DEPAKENE) 250 MG/5ML SOLN solution Place 5 mLs (250 mg total) into feeding tube 2 (two) times daily. 06/19/18   Cherene Altes, MD  Water For Irrigation, Sterile (FREE WATER) SOLN Place 300 mLs into feeding tube every 4 (four) hours. 10/04/18   Erick Colace, NP    This patient is critically ill with multiple organ system failure; which, requires frequent high complexity decision making, assessment, support, evaluation, and titration of therapies. This was completed through the application of advanced monitoring technologies and extensive interpretation of multiple databases. During this encounter critical care time was devoted to patient care services described in this note for 55 minutes.    Garner Nash, DO [facility] 11/12/2018 9:28 AM  Personal pager: [phone] If unanswered, please page CCM On-call: #[phone]   9

## 2018-11-12 NOTE — Progress Notes (Signed)
CRITICAL VALUE ALERT  Critical Value:  Troponin 154  Date & Time Notied:  11/12/2018 @ 1209  Provider Notified: Dr. Valeta Harms  Orders Received/Actions taken: No orders received

## 2018-11-12 NOTE — Progress Notes (Signed)
CRITICAL VALUE ALERT  Critical Value:  LA Date & Time Notied:1510  Provider Notified:Dr. Icard Orders Received/Actions taken: No new orders

## 2018-11-12 NOTE — Progress Notes (Signed)
[place] NOTE  Pharmacy Consult for IV Phenytoin  Indication: Seizure disorder   No Known Allergies  Vital Signs: Temp: 101 F (38.3 C) (07/04 2157) Temp Source: Rectal (07/04 2157) BP: 111/53 (07/05 0045) Pulse Rate: 102 (07/05 0045) Intake/Output from previous day: 07/04 0701 - 07/05 0700 In: 2300 [IV Piggyback:2300] Out: -  Intake/Output from this shift: Total I/O In: 2300 [IV Piggyback:2300] Out: -   Labs: Recent Labs    11/11/18 2236 11/11/18 2245  WBC 45.6*  --   HGB 11.7* 12.9  HCT 39.3 38.0  PLT 387  --   CREATININE 1.68*  --   MG 2.5*  --   PHOS 4.0  --   ALBUMIN 2.0*  --   PROT 6.2*  --   AST 216*  --   ALT 163*  --   ALKPHOS 180*  --   BILITOT 1.5*  --    CrCl cannot be calculated (Unknown ideal weight.).   Assessment: 83 y/o F transfer from [facility] with decreased level of consciousness, WBC marked elevated, pt in acute renal failure with Scr 1.68 (baseline ~0.3-0.4). Albumin is 2. She is on IV phenytoin 100 mg BID at [facility].   Corrected Phenytoin Level is ~16  Goal of Therapy:  Phenytoin level 10-20 mg/L  Plan:  Cont IV phenytoin at 100 mg BID Re-check levels as needed  [name], PharmD, BCPS Clinical Pharmacist Phone: [phone]

## 2018-11-12 NOTE — Progress Notes (Signed)
CRITICAL VALUE ALERT  Critical Value: Lactic Acid 9.1  Date & Time Notied:  11/12/2018 @ 1950  Provider Notified: Dr. Leory Plowman Icard  Orders Received/Actions taken: 1 liter bolus of lactated ringers solution

## 2018-11-12 NOTE — Progress Notes (Signed)
Pharmacy Antibiotic Note  Melissa Cochran is a 83 y.o. female admitted on 11/11/2018 with decreased LOC.  Pharmacy has been consulted for Vancomycin/Merrem dosing for r/o sepsis. Multiple possible sources including urine and lungs, history of both in the past. Pt has history of multiple multi-drug resistant organisms.   Plan: Vancomycin 1250 mg IV q48h Merrem 1g IV q12h Trend WBC, temp, renal function  F/U infectious work-up Drug levels as indicated Will need to follow cultures closely to guide therapy  Temp (24hrs), Avg:101 F (38.3 C), Min:101 F (38.3 C), Max:101 F (38.3 C)  Recent Labs  Lab 11/11/18 2236 11/11/18 2237  WBC 45.6*  --   CREATININE 1.68*  --   LATICACIDVEN  --  9.1*    CrCl cannot be calculated (Unknown ideal weight.).    No Known Allergies  [name], PharmD, BCPS Clinical Pharmacist Phone: [phone]

## 2018-11-12 NOTE — Progress Notes (Signed)
Pt transported from 3M04 to CT and back without incident.

## 2018-11-13 DIAGNOSIS — Z960 Presence of urogenital implants: Secondary | ICD-10-CM

## 2018-11-13 DIAGNOSIS — R740 Nonspecific elevation of levels of transaminase and lactic acid dehydrogenase [LDH]: Secondary | ICD-10-CM

## 2018-11-13 DIAGNOSIS — N179 Acute kidney failure, unspecified: Secondary | ICD-10-CM

## 2018-11-13 DIAGNOSIS — Z8673 Personal history of transient ischemic attack (TIA), and cerebral infarction without residual deficits: Secondary | ICD-10-CM

## 2018-11-13 DIAGNOSIS — N390 Urinary tract infection, site not specified: Secondary | ICD-10-CM | POA: Diagnosis present

## 2018-11-13 DIAGNOSIS — Z95828 Presence of other vascular implants and grafts: Secondary | ICD-10-CM

## 2018-11-13 DIAGNOSIS — N3090 Cystitis, unspecified without hematuria: Secondary | ICD-10-CM

## 2018-11-13 DIAGNOSIS — Z930 Tracheostomy status: Secondary | ICD-10-CM

## 2018-11-13 DIAGNOSIS — R7881 Bacteremia: Secondary | ICD-10-CM

## 2018-11-13 DIAGNOSIS — Z9911 Dependence on respirator [ventilator] status: Secondary | ICD-10-CM

## 2018-11-13 DIAGNOSIS — A498 Other bacterial infections of unspecified site: Secondary | ICD-10-CM

## 2018-11-13 DIAGNOSIS — L89629 Pressure ulcer of left heel, unspecified stage: Secondary | ICD-10-CM

## 2018-11-13 DIAGNOSIS — L89619 Pressure ulcer of right heel, unspecified stage: Secondary | ICD-10-CM

## 2018-11-13 DIAGNOSIS — Z931 Gastrostomy status: Secondary | ICD-10-CM

## 2018-11-13 DIAGNOSIS — L89159 Pressure ulcer of sacral region, unspecified stage: Secondary | ICD-10-CM

## 2018-11-13 DIAGNOSIS — J9610 Chronic respiratory failure, unspecified whether with hypoxia or hypercapnia: Secondary | ICD-10-CM

## 2018-11-13 DIAGNOSIS — Z87891 Personal history of nicotine dependence: Secondary | ICD-10-CM

## 2018-11-13 DIAGNOSIS — B964 Proteus (mirabilis) (morganii) as the cause of diseases classified elsewhere: Secondary | ICD-10-CM

## 2018-11-13 DIAGNOSIS — L89109 Pressure ulcer of unspecified part of back, unspecified stage: Secondary | ICD-10-CM

## 2018-11-13 DIAGNOSIS — Z1629 Resistance to other single specified antibiotic: Secondary | ICD-10-CM

## 2018-11-13 DIAGNOSIS — B961 Klebsiella pneumoniae [K. pneumoniae] as the cause of diseases classified elsewhere: Secondary | ICD-10-CM

## 2018-11-13 LAB — COMPREHENSIVE METABOLIC PANEL
ALT: 350 U/L — ABNORMAL HIGH (ref 0–44)
AST: 298 U/L — ABNORMAL HIGH (ref 15–41)
Albumin: 1.7 g/dL — ABNORMAL LOW (ref 3.5–5.0)
Alkaline Phosphatase: 103 U/L (ref 38–126)
Anion gap: 16 — ABNORMAL HIGH (ref 5–15)
BUN: 67 mg/dL — ABNORMAL HIGH (ref 8–23)
CO2: 19 mmol/L — ABNORMAL LOW (ref 22–32)
Calcium: 8.7 mg/dL — ABNORMAL LOW (ref 8.9–10.3)
Chloride: 105 mmol/L (ref 98–111)
Creatinine, Ser: 1.29 mg/dL — ABNORMAL HIGH (ref 0.44–1.00)
GFR calc Af Amer: 45 mL/min — ABNORMAL LOW (ref >60)
GFR calc non Af Amer: 39 mL/min — ABNORMAL LOW (ref >60)
Glucose, Bld: 139 mg/dL — ABNORMAL HIGH (ref 70–99)
Potassium: 3.8 mmol/L (ref 3.5–5.1)
Sodium: 140 mmol/L (ref 135–145)
Total Bilirubin: 1.6 mg/dL — ABNORMAL HIGH (ref 0.3–1.2)
Total Protein: 5.8 g/dL — ABNORMAL LOW (ref 6.5–8.1)

## 2018-11-13 LAB — CBC WITH DIFFERENTIAL/PLATELET
Abs Immature Granulocytes: 0 10*3/uL (ref 0.00–0.07)
Basophils Absolute: 0 10*3/uL (ref 0.0–0.1)
Basophils Relative: 0 %
Eosinophils Absolute: 0.1 10*3/uL (ref 0.0–0.5)
Eosinophils Relative: 1 %
HCT: 30 % — ABNORMAL LOW (ref 36.0–46.0)
Hemoglobin: 9.3 g/dL — ABNORMAL LOW (ref 12.0–15.0)
Lymphocytes Relative: 14 %
Lymphs Abs: 2 10*3/uL (ref 0.7–4.0)
MCH: 28.6 pg (ref 26.0–34.0)
MCHC: 31 g/dL (ref 30.0–36.0)
MCV: 92.3 fL (ref 80.0–100.0)
Monocytes Absolute: 0 10*3/uL — ABNORMAL LOW (ref 0.1–1.0)
Monocytes Relative: 0 %
Neutro Abs: 12.2 10*3/uL — ABNORMAL HIGH (ref 1.7–7.7)
Neutrophils Relative %: 85 %
Platelets: 264 10*3/uL (ref 150–400)
RBC: 3.25 MIL/uL — ABNORMAL LOW (ref 3.87–5.11)
RDW: 17.7 % — ABNORMAL HIGH (ref 11.5–15.5)
WBC: 14.3 10*3/uL — ABNORMAL HIGH (ref 4.0–10.5)
nRBC: 0 % (ref 0.0–0.2)
nRBC: 0 /100 WBC

## 2018-11-13 LAB — GLUCOSE, CAPILLARY
Glucose-Capillary: 152 mg/dL — ABNORMAL HIGH (ref 70–99)
Glucose-Capillary: 135 mg/dL — ABNORMAL HIGH (ref 70–99)
Glucose-Capillary: 130 mg/dL — ABNORMAL HIGH (ref 70–99)
Glucose-Capillary: 132 mg/dL — ABNORMAL HIGH (ref 70–99)
Glucose-Capillary: 154 mg/dL — ABNORMAL HIGH (ref 70–99)

## 2018-11-13 LAB — MAGNESIUM
Magnesium: 2.2 mg/dL (ref 1.7–2.4)
Magnesium: 2.4 mg/dL (ref 1.7–2.4)

## 2018-11-13 LAB — PHOSPHORUS
Phosphorus: 3.4 mg/dL (ref 2.5–4.6)
Phosphorus: 3.2 mg/dL (ref 2.5–4.6)

## 2018-11-13 LAB — LACTIC ACID, PLASMA: Lactic Acid, Venous: 3.1 mmol/L (ref 0.5–1.9)

## 2018-11-13 MED ORDER — HYDROCORTISONE NA SUCCINATE PF 100 MG IJ SOLR
50.0000 mg | Freq: Two times a day (BID) | INTRAMUSCULAR | Status: AC
  Administered 2018-11-13 – 2018-11-14 (×3): 50 mg via INTRAVENOUS
  Filled 2018-11-13 (×3): qty 2

## 2018-11-13 MED ORDER — DEXTROSE 5 % IV SOLN
1.2500 g | Freq: Three times a day (TID) | INTRAVENOUS | Status: AC
  Administered 2018-11-13 – 2018-11-20 (×22): 1.25 g via INTRAVENOUS
  Filled 2018-11-13 (×29): qty 6

## 2018-11-13 MED ORDER — PRO-STAT SUGAR FREE PO LIQD
30.0000 mL | Freq: Two times a day (BID) | ORAL | Status: DC
  Administered 2018-11-13: 30 mL
  Filled 2018-11-13: qty 30

## 2018-11-13 MED ORDER — PIVOT 1.5 CAL PO LIQD
1000.0000 mL | ORAL | Status: DC
  Administered 2018-11-13 – 2018-11-28 (×18): 1000 mL
  Filled 2018-11-13 (×27): qty 1000

## 2018-11-13 MED ORDER — JUVEN PO PACK
1.0000 | PACK | Freq: Two times a day (BID) | ORAL | Status: DC
  Administered 2018-11-13 – 2018-11-16 (×7): 1 via ORAL
  Filled 2018-11-13 (×8): qty 1

## 2018-11-13 MED ORDER — VITAL HIGH PROTEIN PO LIQD
1000.0000 mL | ORAL | Status: DC
  Administered 2018-11-13: 1000 mL

## 2018-11-13 NOTE — Progress Notes (Signed)
Initial Nutrition Assessment  DOCUMENTATION CODES:   Not applicable  INTERVENTION:   Tube Feeding: Change to Pivot 1.5 at 60 ml/hr Provides 135 g of protein, 2160 kcals, 1094 mL Meets 100% estimated calorie and protein needs  D/C Pro-Stat 30 mL BID  Add Juven BID,each packet provides 80 calories, 8 grams of carbohydrate, 2.5 grams of protein (collagen), 7 grams of L-arginine and 7 grams of L-glutamine; supplement contains CaHMB, Vitamins C, E, B12 and Zinc to promote wound healing  Recommend checking Vitamin C and Zinc levels and supplementing if deficient as pt with multiple non-healing wound   NUTRITION DIAGNOSIS:   Increased nutrient needs related to acute illness, chronic illness, wound healing as evidenced by estimated needs.  GOAL:   Patient will meet greater than or equal to 90% of their needs  MONITOR:   TF tolerance, Labs, Skin, I & O's, Weight trends  REASON FOR ASSESSMENT:   Consult Enteral/tube feeding initiation and management  ASSESSMENT:   83 year old female with chronic hypoxic respiratory failure and ventilator/PEG tube dependence, CAD s/p stent in 1791, chronic systolic heart failure with EF of 45 to 50% by echo on 06/2017, history of CVA, and seizure disorder who presents from [facility] with AMS and hypotension with UTI with septic shock, multiorgan failure   Palliative care consulted; family continues to desire full aggressive treatment and full code status  Pt remains on vent support via trach; pressor requirements decreased, only on levophed at 5 mcg/min  Pt with multiple chronic wounds; recommend checking Vitamin C and Zinc levels and supplementing if deficient  Current wt 71.9 kg; admission wt 72.7 kg. Noted weight has fluctuated over the past year. Pt weighed 79 kg in May, 80 kg in April, 76 kg in February and 71 kg in December of 2019.   G-tube in place  Labs: Creatinine 1.29, BUN 67, elevated LFTs, corrected calcium 10.5, albumin  1.7 Meds: MVI   NUTRITION - FOCUSED PHYSICAL EXAM:    Most Recent Value  Orbital Region  Mild depletion  Upper Arm Region  Unable to assess  Thoracic and Lumbar Region  Unable to assess  Buccal Region  Severe depletion  Temple Region  Severe depletion  Clavicle Bone Region  Severe depletion  Clavicle and Acromion Bone Region  Severe depletion  Scapular Bone Region  Moderate depletion  Dorsal Hand  Unable to assess  Patellar Region  Severe depletion  Anterior Thigh Region  Severe depletion  Posterior Calf Region  Severe depletion  Edema (RD Assessment)  None  Hair  Reviewed  Eyes  Unable to assess  Mouth  Reviewed  Skin  Reviewed  Nails  Reviewed       Diet Order:   Diet Order            Diet NPO time specified  Diet effective now              EDUCATION NEEDS:   No education needs have been identified at this time  Skin:  Skin Assessment: Skin Integrity Issues: Skin Integrity Issues:: Stage III, Stage IV, Unstageable Stage III: sacrum Stage IV: thoracic spine Unstageable: B/L heel, R. forearm  Last BM:  7/6 rectal tube  Height:   Ht Readings from Last 1 Encounters:  11/12/18 5\' 7"  (1.702 m)    Weight:   Wt Readings from Last 1 Encounters:  11/13/18 71.9 kg    Ideal Body Weight:  61.4 kg  BMI:  Body mass index is 24.83 kg/m.  Estimated Nutritional  Needs:   Kcal:  1950-2150 kcals  Protein:  120-145 g  Fluid:  >/= 2 L   Cate Andrzej Scully MS, RDN, LDN, CNSC [phone] Pager  [phone] Weekend/On-Call Pager

## 2018-11-13 NOTE — Consult Note (Signed)
Consultation Note Date: 11/13/2018   Patient Name: Melissa Cochran  DOB: Feb 11, 1936  MRN: [id number]  Age / Sex: 83 y.o., female  PCP: [name], MD Referring Physician: Garner Nash, DO  Reason for Consultation: Establishing goals of care  HPI/Patient Profile: 83 y.o. female  with past medical history of chronic hypoxic respiratory failure with ventilator dependence, tracheostomy, CAD, heart failure, A. fib, COPD, CVA, seizure disorder, DVT, GERD, hypertension, recurrent UTI admitted on 11/11/2018 with altered mental status and hypotension with UTI.  Palliative consult for goals of care  Clinical Assessment and Goals of Care: Chart reviewed including personal review of pertinent labs and imaging.  Discussed case with Dr. Valeta Harms.  [name] is well-known to palliative medicine team from prior encounters and conversations with her daughter, [name].  Family called in to the bedside as patient is critically ill and concerned that she be dying.  I met with her daughter [name], granddaughter, and great granddaughter at the bedside.  Ms. Thorton is lying in bed with trach in place and is unresponsive.  I reviewed clinical course with Rhonda.  She reports being updated thoroughly by Dr. Valeta Harms and understanding that her mother is critically ill.  [name] has always been clear with desire for any and all aggressive interventions and maintains this today.  I let her know that her mother's care team continues to want to focus on interventions that are likely to add time or quality to her mother's life but there is a real concern that she may not survive this hospitalization.  Shortly after this, I participated in conversation with Dr. Valeta Harms and family where he discussed plan of care moving forward and likely outcomes of critically ill patients in ICU that present with problems similar to Ms. Thorton's in light  of her multitude of comorbidities.  Primary decision-maker: Daughter [name]  SUMMARY OF RECOMMENDATIONS   -Multiple conversations with family regarding severity of Ms. Thorton's illness and concern that with her multiple comorbid conditions and the fact that she is critically ill that she is at high risk for continued decompensation -Family remains open to any and all offered interventions.  Throughout multiple interactions with palliative medicine team, her daughter has always been clear in desire for full aggressive treatment and maintaining full CODE STATUS.  I do not think this is likely to change moving forward.  Code Status/Advance Care Planning:  Full code  Palliative Prophylaxis:   Aspiration and Frequent Pain Assessment  Additional Recommendations (Limitations, Scope, Preferences):  Full Scope Treatment  Psycho-social/Spiritual:   Desire for further Chaplaincy support: Did not address today  Additional Recommendations:   Prognosis:   Guarded  Discharge Planning: To Be Determined      Primary Diagnoses: Present on Admission:  Sepsis ([place])   I have reviewed the medical record, interviewed the patient and family, and examined the patient. The following aspects are pertinent.  Past Medical History:  Diagnosis Date   Adnexal mass    Anxiety    Arthritis    Bladder  tumor    Bleeding ulcer    CHF (congestive heart failure) (HCC)    CHF (congestive heart failure) (HCC)    Chronic bronchitis (HCC)    Chronic respiratory failure ([place]) 04/19/2018   COPD (chronic obstructive pulmonary disease) (HCC)    Coronary artery disease    CVA (cerebral vascular accident) ([place]) 04/19/2018   DVT (deep venous thrombosis) (HCC)    RLE   GERD (gastroesophageal reflux disease)    Heart block    left   History of bleeding ulcers    History of kidney stones    Hyperlipemia    Hypertension    Myocardial infarction ([place])    "slight one" (07/05/2017)    Pneumonia    "several times" (07/05/2017)   Seizures ([place]) 02/2017 "several"; 06/26/2017 X 1   Shortness of breath dyspnea    Stroke ([place]) 02/13/2018   "still right sided weakness" (07/05/2017)   Social History   Socioeconomic History   Marital status: Widowed    Spouse name: Not on file   Number of children: Not on file   Years of education: Not on file   Highest education level: Not on file  Occupational History   Not on file  Social Needs   Financial resource strain: Not on file   Food insecurity    Worry: Not on file    Inability: Not on file   Transportation needs    Medical: Not on file    Non-medical: Not on file  Tobacco Use   Smoking status: Former Smoker    Packs/day: 1.00    Years: 40.00    Pack years: 40.00    Quit date: 06/13/1998    Years since quitting: 20.4   Smokeless tobacco: Never Used  Substance and Sexual Activity   Alcohol use: No   Drug use: No   Sexual activity: Not Currently  Lifestyle   Physical activity    Days per week: Not on file    Minutes per session: Not on file   Stress: Not on file  Relationships   Social connections    Talks on phone: Not on file    Gets together: Not on file    Attends religious service: Not on file    Active member of club or organization: Not on file    Attends meetings of clubs or organizations: Not on file    Relationship status: Not on file  Other Topics Concern   Not on file  Social History Narrative   Not on file   Family History  Problem Relation Age of Onset   Stomach cancer Mother    CVA Father    Bladder Cancer Neg Hx    Kidney cancer Neg Hx    Scheduled Meds:  aspirin  81 mg Per Tube Daily   chlorhexidine gluconate (MEDLINE KIT)  15 mL Mouth Rinse BID   Chlorhexidine Gluconate Cloth  6 each Topical Daily   enoxaparin (LOVENOX) injection  30 mg Subcutaneous Q24H   feeding supplement (PRO-STAT SUGAR FREE 64)  30 mL Per Tube BID   feeding supplement (VITAL HIGH  PROTEIN)  1,000 mL Per Tube Q24H   hydrocortisone sod succinate (SOLU-CORTEF) inj  50 mg Intravenous Q12H   mouth rinse  15 mL Mouth Rinse 10 times per day   midodrine  5 mg Per Tube TID WC   multivitamin  15 mL Per Tube Daily   pantoprazole (PROTONIX) IV  40 mg Intravenous Q24H   phenytoin  100 mg  Intravenous BID   Continuous Infusions:  ceftazidime avibactam (AVYCAZ) IVPB Stopped (11/12/18 2234)   levETIRAcetam Stopped (11/12/18 1907)   norepinephrine (LEVOPHED) Adult infusion 5 mcg/min (11/13/18 0800)   phenytoin (DILANTIN) IV Stopped (11/12/18 2323)   valproate sodium Stopped (11/13/18 0139)   PRN Meds:.acetaminophen, bisacodyl, docusate, fentaNYL (SUBLIMAZE) injection, fentaNYL (SUBLIMAZE) injection, ipratropium-albuterol, sodium chloride flush Medications Prior to Admission:  Prior to Admission medications   Medication Sig Start Date End Date Taking? Authorizing Provider  Amino Acids-Protein Hydrolys (FEEDING SUPPLEMENT, PRO-STAT SUGAR FREE 64,) LIQD Place 30 mLs into feeding tube 3 (three) times daily. 10/04/18  Yes Erick Colace, NP  aspirin 81 MG chewable tablet Place 1 tablet (81 mg total) into feeding tube daily. 06/19/18  Yes Cherene Altes, MD  ceFEPime (MAXIPIME) IVPB Inject 1 g into the vein every 8 (eight) hours.   Yes [provider]  enoxaparin (LOVENOX) 40 MG/0.4ML injection Inject 0.4 mLs (40 mg total) into the skin daily. 10/05/18  Yes Erick Colace, NP  famotidine (PEPCID) 20 MG tablet Place 20 mg into feeding tube daily.    Yes [provider]  furosemide (LASIX) 10 MG/ML solution Take 40 mg by mouth 2 (two) times daily.   Yes [provider]  levETIRAcetam (KEPPRA) 100 MG/ML solution Place 10 mLs (1,000 mg total) into feeding tube 2 (two) times daily. 06/19/18  Yes Cherene Altes, MD  midodrine (PROAMATINE) 5 MG tablet Take 1 tablet (5 mg total) by mouth 3 (three) times daily with meals. 10/04/18  Yes Erick Colace,  NP  Multiple Vitamin (MULTIVITAMIN) LIQD Place 15 mLs into feeding tube daily. 10/05/18  Yes Erick Colace, NP  nystatin (MYCOSTATIN) 100000 UNIT/ML suspension Take 5 mLs (500,000 Units total) by mouth 4 (four) times daily. 10/04/18  Yes Erick Colace, NP  pantoprazole sodium (PROTONIX) 40 mg/20 mL PACK Place 20 mLs (40 mg total) into feeding tube daily. 10/05/18  Yes Erick Colace, NP  phenytoin (DILANTIN) 125 MG/5ML suspension Place 500 mg into feeding tube 2 (two) times daily.   Yes [provider]  Valproate Sodium (DEPAKENE) 250 MG/5ML SOLN solution Place 5 mLs (250 mg total) into feeding tube 2 (two) times daily. 06/19/18  Yes Cherene Altes, MD  vancomycin IVPB Inject 1,000 mg into the vein daily.   Yes [provider]  chlorhexidine (PERIDEX) 0.12 % solution 5 mLs See admin instructions. 5 ml's for trach care 2 times a day    [provider]  HYDROcodone-acetaminophen (NORCO/VICODIN) 5-325 MG tablet Place 1 tablet into feeding tube every 6 (six) hours as needed for moderate pain. 10/04/18   Erick Colace, NP  ipratropium-albuterol (DUONEB) 0.5-2.5 (3) MG/3ML SOLN Take 3 mLs by nebulization as needed (for acute and chronic respiratory failure, unspecified whether with hypoxia or hypercapnia).     [provider]  Nutritional Supplements (FEEDING SUPPLEMENT, OSMOLITE 1.5 CAL,) LIQD Place 1,000 mLs into feeding tube continuous. 10/04/18   Erick Colace, NP  Sodium Chloride Flush (NORMAL SALINE FLUSH IV) Inject 10 mLs into the vein See admin instructions. Use 10 ml's via IV every shift for any used lumens- flush each unused lumen    [provider]  Water For Irrigation, Sterile (FREE WATER) SOLN Place 300 mLs into feeding tube every 4 (four) hours. 10/04/18   Erick Colace, NP   No Known Allergies Review of Systems  Unable to assess  Physical Exam  General: Ill-appearing, trach in place. Does  not arouse Heart: Tachycardic. No  murmur appreciated. Lungs: Good air movement with vent support, scattered rhonchi Abdomen: Distended.  Ext: No significant edema Skin: Warm and dry  Vital Signs: BP 113/63    Pulse (!) 108    Temp 100.1 F (37.8 C) (Oral)    Resp (!) 22    Ht '5\' 7"'$  (1.702 m)    Wt 71.9 kg    SpO2 96%    BMI 24.83 kg/m  Pain Scale: CPOT   Pain Score: 0-No pain   SpO2: SpO2: 96 % O2 Device:SpO2: 96 % O2 Flow Rate: .   IO: Intake/output summary:   Intake/Output Summary (Last 24 hours) at 11/13/2018 7897 Last data filed at 11/13/2018 0800 Gross per 24 hour  Intake 1633.01 ml  Output 1510 ml  Net 123.01 ml    LBM: Last BM Date: 11/13/18 Baseline Weight: Weight: 72.7 kg Most recent weight: Weight: 71.9 kg     Palliative Assessment/Data:   Flowsheet Rows     Most Recent Value  Intake Tab  Referral Department  Critical care  Unit at Time of Referral  ICU  Palliative Care Primary Diagnosis  Sepsis/Infectious Disease  Date Notified  11/12/18  Palliative Care Type  Return patient Palliative Care  Reason for referral  Clarify Goals of Care  Date of Admission  11/12/18  Date first seen by Palliative Care  11/13/18  # of days Palliative referral response time  1 Day(s)  # of days IP prior to Palliative referral  0  Clinical Assessment  Palliative Performance Scale Score  20%  Psychosocial & Spiritual Assessment  Palliative Care Outcomes  Patient/Family meeting held?  Yes  Who was at the meeting?  Daughter, Granddaughter  Palliative Care Outcomes  Clarified goals of care      Time In: 1330 Time Out: 1450 Time Total: 80 Greater than 50%  of this time was spent counseling and coordinating care related to the above assessment and plan.  Signed by: Micheline Rough, MD   Please contact Palliative Medicine Team phone at [phone] for questions and concerns.  For individual provider: See Shea Evans

## 2018-11-13 NOTE — Progress Notes (Signed)
NAME:  Melissa Cochran, MRN:  [id number], DOB:  07/03/1935, LOS: 1 ADMISSION DATE:  11/11/2018, CONSULTATION DATE: 11/11/2018 REFERRING MD: ED, CHIEF COMPLAINT: Sepsis  Brief History   83 year old woman with history of chronic hypoxic respiratory failure with ventilator/PEG tube dependence, CAD, HFrEF (EF 45-50% 06/2017), paroxysmal atrial fibrillation, adnexal mass, COPD, CVA with chronic encephalopathy, seizure disorder, DVT, GERD, hypertension, recurrent UTI presenting from [facility] with acute on chronic encephalopathy and hypotension found to have UTI.  History of present illness   83 year old woman with history of chronic hypoxic respiratory failure with ventilator/PEG tube dependence, CAD, HFrEF (EF 45-50% 06/2017), paroxysmal atrial fibrillation, adnexal mass, COPD, CVA with chronic encephalopathy, seizure disorder, DVT, GERD, hypertension, recurrent UTI presenting from [facility] with acute on chronic encephalopathy and hypotension.  Per EMR her baseline level of consciousness is eyes open and tracking.  Per daughter, she was corresponding with the [facility] nurses.  There were concerns today for fever, tachycardia.  At one point, she was reported to appear gray.  There was also report of emesis.  Past Medical History  Chronic hypoxic respiratory failure with ventilator/PEG tube dependence, CAD, HFrEF (EF 45-50% 06/2017), paroxysmal atrial fibrillation, adnexal mass, COPD, CVA with chronic encephalopathy, seizure disorder, DVT, GERD, hypertension, recurrent UTI  Significant Hospital Events   7/5> admit  Consults:  7/6: Infectious Disease: CRE   Procedures:  none  Significant Diagnostic Tests:  CXR 7/4> Improvement in bilateral multifocal airspace disease with mild residual streaky opacities at the bases. Tracheostomy tube and left upper extremity PICC remain in place.   CT Chest/ab/pelvis: 1. Extensive patchy tree-in-bud opacities and scattered centrilobular nodularity throughout both  lungs with associated scattered mild bronchiectasis. No appreciable interval change since 09/27/2018 CT. Differential includes recurrent aspiration and/or atypical mycobacterial infection (MAI). 2. Dense consolidation with air bronchograms at the lung bases bilaterally, unchanged, favor atelectasis and/or postinfectious/postinflammatory scarring. 3. Well-positioned tracheostomy tube, left PICC and percutaneous gastrostomy and rectal tubes. 4. Mild mediastinal lymphadenopathy is stable and probably reactive. 5. Trace dependent left pleural effusion. 6. No evidence of bowel obstruction. Fluid levels scattered in the large bowel, indicative of a nonspecific diarrheal/malabsorptive state. Mild sigmoid diverticulosis, with no evidence of acute diverticulitis. 7. Mildly irregular liver surface, cannot exclude cirrhosis. 8. Stable multilocular 9.0 cm left adnexal cystic mass, cannot exclude left ovarian neoplasm. 9. Nonobstructive bilateral nephrolithiasis. 10.  Aortic Atherosclerosis (ICD10-I70.0).   Micro Data:  Resp cx  Blood cx 7/4> +Kleb pna, +enterbacteraceia, +CRE Urine cx 7/4> Covid Negative  C diff negative   Antimicrobials:  Vanc 7/4>7/5 Meropenem 7/4>7/5  ceftaz-Avabactam 7/5  Interim history/subjective:   Critically ill. Intubated on mechanical life support. Continuous vasopressors   Objective   Blood pressure (!) 110/55, pulse (!) 108, temperature 98.8 F (37.1 C), temperature source Oral, resp. rate (!) 22, height 5\' 7"  (1.702 m), weight 71.9 kg, SpO2 98 %.    Vent Mode: PCV FiO2 (%):  [40 %-60 %] 40 % Set Rate:  [20 bmp] 20 bmp PEEP:  [5 cmH20] 5 cmH20 Plateau Pressure:  [23 cmH20-28 cmH20] 23 cmH20   Intake/Output Summary (Last 24 hours) at 11/13/2018 0750 Last data filed at 11/13/2018 0600 Gross per 24 hour  Intake 2036.88 ml  Output 1548 ml  Net 488.88 ml   Filed Weights   11/12/18 0200 11/13/18 0740  Weight: 72.7 kg 71.9 kg     Examination: General: chronically ill, trach, vent dependent  HENT: trach in place Lungs: BL vented breath sounds  Cardiovascular: tachycardic, regular,  s1 s2 Abdomen: distended, nt Extremities: no significant edema  Neuro: unresponsive to pain, no response to voice    Assessment & Plan:   Septic Shock from presumed urinary tract infection  Possible ischemic bowel related to shock and supply/demand ischemia  MDRO Bacteremia, CRE Klebsiella pneumonia  Multiorgan Failure secondary to above  - changed to ceftaz-avibactam - ID consulted for their recs  - wean vasopressin off - maintain norepi for goal MAP >75, titrate to effective dose  Acute on Chronic Respiratory Failure, vent dependent respiratory failure  - full vent support - base line vent dependent  - wean fio2 to maintain spo2 >90   Hyponatremia  AGMA Lactic Acidosis  Acute renal failure  - still with some UOP  - positive CFB - continue to observe   Transaminitis Acute shock liver - secondary to shock above - will observe  - repeat labs today   Seizure history: -- iv AEDs, dilantin, keppra, valpro   CAD: - continue asa   COPD: - continue duonebs   Severe protein calorie malnutrition  - tube feeds started   Best practice:  Diet: TF  Pain/Anxiety/Delirium protocol (if indicated): Fentanyl PRN VAP protocol (if indicated): Ordered DVT prophylaxis: SubQ Lovenox GI prophylaxis: Protonix Glucose control: Monitor Mobility: PT when able Code Status: Full code Family Communication: daughter updated  Disposition: ICU  Labs   CBC: Recent Labs  Lab 11/11/18 2236 11/11/18 2245 11/12/18 0254 11/12/18 1614  WBC 45.6*  --  23.0* 20.3*  NEUTROABS 41.7*  --   --   --   HGB 11.7* 12.9 12.5 10.1*  HCT 39.3 38.0 41.5 33.1*  MCV 96.6  --  95.4 93.0  PLT 387  --  338 287    Basic Metabolic Panel: Recent Labs  Lab 11/11/18 2236 11/11/18 2245 11/12/18 0254 11/12/18 1614  NA 130* 130* 132* 135  K  5.1 5.1 5.4* 5.1  CL 98  --  97* 105  CO2 13*  --  16* 14*  GLUCOSE 125*  --  101* 165*  BUN 57*  --  57* 59*  CREATININE 1.68*  --  1.49* 1.55*  CALCIUM 9.8  --  10.2 8.7*  MG 2.5*  --  2.4  --   PHOS 4.0  --  3.3  --    GFR: Estimated Creatinine Clearance: 27.7 mL/min (A) (by C-G formula based on SCr of 1.55 mg/dL (H)). Recent Labs  Lab 11/11/18 2236  11/12/18 0254 11/12/18 0641 11/12/18 0959 11/12/18 1407 11/12/18 1614  WBC 45.6*  --  23.0*  --   --   --  20.3*  LATICACIDVEN  --    < > 9.5* 9.1* 4.2* 4.5*  --    < > = values in this interval not displayed.    Liver Function Tests: Recent Labs  Lab 11/11/18 2236 11/12/18 0254 11/12/18 1614  AST 216* 558* 382*  ALT 163* 433* 340*  ALKPHOS 180* 180* 110  BILITOT 1.5* 1.9* 1.4*  PROT 6.2* 6.7 5.3*  ALBUMIN 2.0* 2.2* 1.6*   ABG    Component Value Date/Time   PHART 7.470 (H) 09/26/2018 1758   PCO2ART 44.2 09/26/2018 1758   PO2ART 65.0 (L) 09/26/2018 1758   HCO3 13.5 (L) 11/11/2018 2245   TCO2 14 (L) 11/11/2018 2245   ACIDBASEDEF 9.0 (H) 11/11/2018 2245   O2SAT 99.0 11/11/2018 2245     HbA1C: Hgb A1c MFr Bld  Date/Time Value Ref Range Status  02/13/2017 09:35 PM 5.1 4.8 - 5.6 %  Final    Comment:    (NOTE) Pre diabetes:          5.7%-6.4% Diabetes:              >6.4% Glycemic control for   <7.0% adults with diabetes      Past Medical History  She,  has a past medical history of Adnexal mass, Anxiety, Arthritis, Bladder tumor, Bleeding ulcer, CHF (congestive heart failure) ([place]), CHF (congestive heart failure) ([place]), Chronic bronchitis ([place]), Chronic respiratory failure ([place]) (04/19/2018), COPD (chronic obstructive pulmonary disease) ([place]), Coronary artery disease, CVA (cerebral vascular accident) ([place]) (04/19/2018), DVT (deep venous thrombosis) ([place]), GERD (gastroesophageal reflux disease), Heart block, History of bleeding ulcers, History of kidney stones, Hyperlipemia, Hypertension, Myocardial infarction  ([place]), Pneumonia, Seizures ([place]) (02/2017 "several"; 06/26/2017 X 1), Shortness of breath dyspnea, and Stroke ([place]) (02/13/2018).   Surgical History    Past Surgical History:  Procedure Laterality Date   ABDOMINAL HYSTERECTOMY     CARDIAC CATHETERIZATION Left 10/21/2015   Procedure: Left Heart Cath and Coronary Angiography;  Surgeon: Isaias Cowman, MD;  Location: [facility];  Service: Cardiovascular;  Laterality: Left;   CARDIAC CATHETERIZATION N/A 10/21/2015   Procedure: Coronary Stent Intervention;  Surgeon: Isaias Cowman, MD;  Location: [facility];  Service: Cardiovascular;  Laterality: N/A;   CARDIAC CATHETERIZATION     CYSTOSCOPY W/ RETROGRADES Bilateral 01/20/2016   Procedure: CYSTOSCOPY WITH RETROGRADE PYELOGRAM;  Surgeon: Hollice Espy, MD;  Location: [facility];  Service: Urology;  Laterality: Bilateral;   CYSTOSCOPY WITH STENT PLACEMENT Right 01/20/2016   Procedure: CYSTOSCOPY WITH STENT PLACEMENT;  Surgeon: Hollice Espy, MD;  Location: [facility];  Service: Urology;  Laterality: Right;   HIP FRACTURE SURGERY Left    STOMACH SURGERY     "stomach ulcers"   TRACHEOSTOMY TUBE PLACEMENT N/A 03/02/2017   Procedure: TRACHEOSTOMY;  Surgeon: Rozetta Nunnery, MD;  Location: [facility];  Service: ENT;  Laterality: N/A;   TRANSURETHRAL RESECTION OF BLADDER TUMOR N/A 01/20/2016   Procedure: TRANSURETHRAL RESECTION OF BLADDER TUMOR (TURBT);  Surgeon: Hollice Espy, MD;  Location: [facility];  Service: Urology;  Laterality: N/A;     Social History   reports that she quit smoking about 20 years ago. She has a 40.00 pack-year smoking history. She has never used smokeless tobacco. She reports that she does not drink alcohol or use drugs.   Family History   Her family history includes CVA in her father; Stomach cancer in her mother. There is no history of Bladder Cancer or Kidney cancer.   Allergies No Known Allergies    Home Medications  Prior to  Admission medications   Medication Sig Start Date End Date Taking? Authorizing Provider  Amino Acids-Protein Hydrolys (FEEDING SUPPLEMENT, PRO-STAT SUGAR FREE 64,) LIQD Place 30 mLs into feeding tube 3 (three) times daily. 10/04/18   Erick Colace, NP  aspirin 81 MG chewable tablet Place 1 tablet (81 mg total) into feeding tube daily. 06/19/18   Cherene Altes, MD  cefTAZidime 2 g in sodium chloride 0.9 % 100 mL Inject 2 g into the vein every 8 (eight) hours. 10/04/18   Erick Colace, NP  chlorhexidine (PERIDEX) 0.12 % solution 5 mLs See admin instructions. 5 ml's for trach care 2 times a day    [provider]  enoxaparin (LOVENOX) 40 MG/0.4ML injection Inject 0.4 mLs (40 mg total) into the skin daily. 10/05/18   Erick Colace, NP  famotidine (PEPCID) 20 MG tablet Place  20 mg into feeding tube daily.     [provider]  HYDROcodone-acetaminophen (NORCO/VICODIN) 5-325 MG tablet Place 1 tablet into feeding tube every 6 (six) hours as needed for moderate pain. 10/04/18   Erick Colace, NP  ipratropium-albuterol (DUONEB) 0.5-2.5 (3) MG/3ML SOLN Take 3 mLs by nebulization as needed (for acute and chronic respiratory failure, unspecified whether with hypoxia or hypercapnia).     [provider]  levETIRAcetam (KEPPRA) 100 MG/ML solution Place 10 mLs (1,000 mg total) into feeding tube 2 (two) times daily. 06/19/18   Cherene Altes, MD  midodrine (PROAMATINE) 5 MG tablet Take 1 tablet (5 mg total) by mouth 3 (three) times daily with meals. 10/04/18   Erick Colace, NP  Multiple Vitamin (MULTIVITAMIN) LIQD Place 15 mLs into feeding tube daily. 10/05/18   Erick Colace, NP  Nutritional Supplements (FEEDING SUPPLEMENT, OSMOLITE 1.5 CAL,) LIQD Place 1,000 mLs into feeding tube continuous. 10/04/18   Erick Colace, NP  nystatin (MYCOSTATIN) 100000 UNIT/ML suspension Take 5 mLs (500,000 Units total) by mouth 4 (four) times daily. 10/04/18   Erick Colace, NP   pantoprazole sodium (PROTONIX) 40 mg/20 mL PACK Place 20 mLs (40 mg total) into feeding tube daily. 10/05/18   Erick Colace, NP  phenytoin (DILANTIN) 50 MG/ML injection Inject 2 mLs (100 mg total) into the vein 2 (two) times daily. 10/05/18   Erick Colace, NP  phenytoin 150 mg in sodium chloride 0.9 % 100 mL Inject 150 mg into the vein at bedtime. 10/04/18   Erick Colace, NP  Sodium Chloride Flush (NORMAL SALINE FLUSH IV) Inject 10 mLs into the vein See admin instructions. Use 10 ml's via IV every shift for any used lumens- flush each unused lumen    [provider]  Valproate Sodium (DEPAKENE) 250 MG/5ML SOLN solution Place 5 mLs (250 mg total) into feeding tube 2 (two) times daily. 06/19/18   Cherene Altes, MD  Water For Irrigation, Sterile (FREE WATER) SOLN Place 300 mLs into feeding tube every 4 (four) hours. 10/04/18   Erick Colace, NP    This patient is critically ill with multiple organ system failure; which, requires frequent high complexity decision making, assessment, support, evaluation, and titration of therapies. This was completed through the application of advanced monitoring technologies and extensive interpretation of multiple databases. During this encounter critical care time was devoted to patient care services described in this note for 34 minutes.   Garner Nash, DO [facility] 11/13/2018 7:50 AM  Personal pager: [phone] If unanswered, please page CCM On-call: [phone]

## 2018-11-13 NOTE — Progress Notes (Signed)
CRITICAL VALUE ALERT  Critical Value:  Lactate 3.1  Date & Time Notied: 11/13/18 @ 3403  Provider Notified: Dr. Leory Plowman Icard   Orders Received/Actions taken: No orders received

## 2018-11-13 NOTE — Consult Note (Signed)
[facility] Nurse wound consult note Reason for Consult: pressure injuries  Wound type: Unstageable pressure injury: left heel Unstageable pressure injury; right heel Full thickness ulceration with eschar right forearm Healing Stage 3 Pressure injury: thoracic spine Healing Stage 4 Pressure injury: sacrum  Pressure Injury POA: Yes Measurement: see nursing flow sheets Wound bed: Heels 100% eschar Sacrum: healing;100% pink Thoracic spine: 100% pink, healing  Right forearm;100% eschar Drainage (amount, consistency, odor) none Periwound: intact  Dressing procedure/placement/frequency: Foam dressings per the skin care order set implemented and are appropriate for each site. Prevalon boots in place for offloading the heel ulcers. LALM in place while in the ICU.  Optimize nutrition, patient is bedbound, trached and unresponsive   Discussed POC with bedside nurse.  Re consult if needed, will not follow at this time. Thanks  Illias Pantano [employer], RN,CWOCN, CNS, [place] [phone])

## 2018-11-13 NOTE — Progress Notes (Signed)
Pharmacy Antibiotic Note  Melissa Cochran is a 83 y.o. female admitted on 11/11/2018 with septic shock. Pharmacy has been consulted for Avycaz dosing for KPC Kleb pneumo bacteremia.  Renal function is improving, Tmax 100.6 (down), WBC down to 20.3, LA 4.5.  Plan: Change Avycaz to 1.25gm IV Q8H Monitor renal fxn, clinical progress   Height: 5\' 7"  (170.2 cm) Weight: 158 lb 8.2 oz (71.9 kg) IBW/kg (Calculated) : 61.6  Temp (24hrs), Avg:99.6 F (37.6 C), Min:98.8 F (37.1 C), Max:100.6 F (38.1 C)  Recent Labs  Lab 11/11/18 2236  11/12/18 0254 11/12/18 0641 11/12/18 0959 11/12/18 1407 11/12/18 1614 11/13/18 0805  WBC 45.6*  --  23.0*  --   --   --  20.3* 14.3*  CREATININE 1.68*  --  1.49*  --   --   --  1.55* 1.29*  LATICACIDVEN  --    < > 9.5* 9.1* 4.2* 4.5*  --  3.1*   < > = values in this interval not displayed.    Estimated Creatinine Clearance: 33.3 mL/min (A) (by C-G formula based on SCr of 1.29 mg/dL (H)).    No Known Allergies   Vanc 7/4 >> 7/5 Merrem 7/5 >> 7/5 Avycaz 7/5 >>  7/4 COVID - negative 7/4 UCx -  7/4 BCx - 1 of 2 GNR (BCID Kleb pneumo KPC) 7/5 RCx (TA) - GNR / GPC / GPR on stain 7/5 CDiff - negative  Ihor Meinzer D. Mina Marble, PharmD, BCPS, [place] 11/13/2018, 12:38 PM

## 2018-11-13 NOTE — Consult Note (Signed)
[facility] for Infectious Disease    Date of Admission:  11/11/2018           Day 1 ceftazidime-avibactam       Reason for Consult: Carbapenemase producing Klebsiella bacteremia    Referring Provider: Dr. Leroy Sea Icard  Assessment: She has Klebsiella bacteremia with multiple potential sources.  I agree with ceftazidime-avibactam.  Plan: 1. Continue ceftazidime-avibactam 2. I will follow-up tomorrow  Principal Problem:   Bacteremia due to Klebsiella pneumoniae Active Problems:   Chronic respiratory failure (HCC)   Sepsis ([place])   UTI (urinary tract infection)   Scheduled Meds: . aspirin  81 mg Per Tube Daily  . chlorhexidine gluconate (MEDLINE KIT)  15 mL Mouth Rinse BID  . Chlorhexidine Gluconate Cloth  6 each Topical Daily  . enoxaparin (LOVENOX) injection  30 mg Subcutaneous Q24H  . hydrocortisone sod succinate (SOLU-CORTEF) inj  50 mg Intravenous Q12H  . mouth rinse  15 mL Mouth Rinse 10 times per day  . midodrine  5 mg Per Tube TID WC  . multivitamin  15 mL Per Tube Daily  . nutrition supplement (JUVEN)  1 packet Oral BID  . pantoprazole (PROTONIX) IV  40 mg Intravenous Q24H  . phenytoin  100 mg Intravenous BID   Continuous Infusions: . ceftazidime avibactam (AVYCAZ) IVPB Stopped (11/13/18 1144)  . feeding supplement (PIVOT 1.5 CAL)    . levETIRAcetam Stopped (11/12/18 1907)  . norepinephrine (LEVOPHED) Adult infusion 5 mcg/min (11/13/18 1200)  . phenytoin (DILANTIN) IV Stopped (11/12/18 2323)  . valproate sodium 51.3 mL/hr at 11/13/18 1200   PRN Meds:.acetaminophen, bisacodyl, docusate, fentaNYL (SUBLIMAZE) injection, fentaNYL (SUBLIMAZE) injection, ipratropium-albuterol, sodium chloride flush  HPI: Melissa Cochran is a 83 y.o. female with a history of CVA, chronic respiratory failure who is status post tracheostomy.  She was transferred from [facility] yesterday with a temperature of 102.3 degrees, hypotension and worsening mental status.   Both admission blood cultures have grown Klebsiella and BCID contacted Carbapenem resistance.  Urine culture is growing Proteus and Klebsiella.  Sepsis seems to have stabilized even before she received her first dose of ceftazidime-avibactam this a.m.   Review of Systems: Review of Systems  Unable to perform ROS: Mental acuity    Past Medical History:  Diagnosis Date  . Adnexal mass   . Anxiety   . Arthritis   . Bladder tumor   . Bleeding ulcer   . CHF (congestive heart failure) ([place])   . CHF (congestive heart failure) ([place])   . Chronic bronchitis ([place])   . Chronic respiratory failure ([place]) 04/19/2018  . COPD (chronic obstructive pulmonary disease) ([place])   . Coronary artery disease   . CVA (cerebral vascular accident) ([place]) 04/19/2018  . DVT (deep venous thrombosis) (HCC)    RLE  . GERD (gastroesophageal reflux disease)   . Heart block    left  . History of bleeding ulcers   . History of kidney stones   . Hyperlipemia   . Hypertension   . Myocardial infarction ([place])    "slight one" (07/05/2017)  . Pneumonia    "several times" (07/05/2017)  . Seizures ([place]) 02/2017 "several"; 06/26/2017 X 1  . Shortness of breath dyspnea   . Stroke [facility]) 02/13/2018   "still right sided weakness" (07/05/2017)    Social History   Tobacco Use  . Smoking status: Former Smoker    Packs/day: 1.00    Years: 40.00    Pack years: 40.00  Quit date: 06/13/1998    Years since quitting: 20.4  . Smokeless tobacco: Never Used  Substance Use Topics  . Alcohol use: No  . Drug use: No    Family History  Problem Relation Age of Onset  . Stomach cancer Mother   . CVA Father   . Bladder Cancer Neg Hx   . Kidney cancer Neg Hx    No Known Allergies  OBJECTIVE: Blood pressure 117/66, pulse (!) 111, temperature 100.1 F (37.8 C), temperature source Oral, resp. rate 20, height _0  (1.702 m), weight 71.9 kg, SpO2 98 %.  Physical Exam Constitutional:      Comments: She is unresponsive on the  ventilator.  She has a tracheostomy.  Cardiovascular:     Rate and Rhythm: Normal rate and regular rhythm.     Heart sounds: No murmur.  Pulmonary:     Breath sounds: Rales present.  Abdominal:     Palpations: Abdomen is soft.     Comments: She has a PEG tube and Foley catheter.  Skin:    Comments: She has a left arm PICC line that was placed in late June.  She has a large eschar on her right forearm.  She has pressure sores on both heels, sacrum and mid back.     Lab Results Lab Results  Component Value Date   WBC 14.3 (H) 11/13/2018   HGB 9.3 (L) 11/13/2018   HCT 30.0 (L) 11/13/2018   MCV 92.3 11/13/2018   PLT 264 11/13/2018    Lab Results  Component Value Date   CREATININE 1.29 (H) 11/13/2018   BUN 67 (H) 11/13/2018   NA 140 11/13/2018   K 3.8 11/13/2018   CL 105 11/13/2018   CO2 19 (L) 11/13/2018    Lab Results  Component Value Date   ALT 350 (H) 11/13/2018   AST 298 (H) 11/13/2018   ALKPHOS 103 11/13/2018   BILITOT 1.6 (H) 11/13/2018     Microbiology: Recent Results (from the past 240 hour(s))  SARS Coronavirus 2 (CEPHEID- Performed in [facility] hospital lab), Hosp Order     Status: None   Collection Time: 11/11/18 10:04 PM   Specimen: Nasopharyngeal Swab  Result Value Ref Range Status   SARS Coronavirus 2 NEGATIVE NEGATIVE Final    Comment: (NOTE) If result is NEGATIVE SARS-CoV-2 target nucleic acids are NOT DETECTED. The SARS-CoV-2 RNA is generally detectable in upper and lower  respiratory specimens during the acute phase of infection. The lowest  concentration of SARS-CoV-2 viral copies this assay can detect is 250  copies / mL. A negative result does not preclude SARS-CoV-2 infection  and should not be used as the sole basis for treatment or other  patient management decisions.  A negative result may occur with  improper specimen collection / handling, submission of specimen other  than nasopharyngeal swab, presence of viral mutation(s) within the   areas targeted by this assay, and inadequate number of viral copies  (<250 copies / mL). A negative result must be combined with clinical  observations, patient history, and epidemiological information. If result is POSITIVE SARS-CoV-2 target nucleic acids are DETECTED. The SARS-CoV-2 RNA is generally detectable in upper and lower  respiratory specimens dur ing the acute phase of infection.  Positive  results are indicative of active infection with SARS-CoV-2.  Clinical  correlation with patient history and other diagnostic information is  necessary to determine patient infection status.  Positive results do  not rule out bacterial infection or  co-infection with other viruses. If result is PRESUMPTIVE POSTIVE SARS-CoV-2 nucleic acids MAY BE PRESENT.   A presumptive positive result was obtained on the submitted specimen  and confirmed on repeat testing.  While 2019 novel coronavirus  (SARS-CoV-2) nucleic acids may be present in the submitted sample  additional confirmatory testing may be necessary for epidemiological  and / or clinical management purposes  to differentiate between  SARS-CoV-2 and other Sarbecovirus currently known to infect humans.  If clinically indicated additional testing with an alternate test  methodology [id number]) is advised. The SARS-CoV-2 RNA is generally  detectable in upper and lower respiratory sp ecimens during the acute  phase of infection. The expected result is Negative. Fact Sheet for Patients:  StrictlyIdeas.no Fact Sheet for Healthcare Providers: BankingDealers.co.za This test is not yet approved or cleared by the [place] FDA and has been authorized for detection and/or diagnosis of SARS-CoV-2 by FDA under an Emergency Use Authorization (EUA).  This EUA will remain in effect (meaning this test can be used) for the duration of the COVID-19 declaration under Section 564(b)(1) of the Act, 21 U.S.C.  section 360bbb-3(b)(1), unless the authorization is terminated or revoked sooner. Performed at [facility], [place] [address], [place], [place] [address]   Urine culture     Status: Abnormal (Preliminary result)   Collection Time: 11/11/18 10:17 PM   Specimen: Urine, Catheterized  Result Value Ref Range Status   Specimen Description URINE, CATHETERIZED  Final   Special Requests   Final    NONE Performed at [facility], [place] [address], [place], [place] [address]    Culture (A)  Final    >=100,000 COLONIES/mL PROTEUS MIRABILIS 70,000 COLONIES/mL KLEBSIELLA PNEUMONIAE    Report Status PENDING  Incomplete  Blood Culture (routine x 2)     Status: None (Preliminary result)   Collection Time: 11/11/18 10:37 PM   Specimen: BLOOD RIGHT ARM  Result Value Ref Range Status   Specimen Description BLOOD RIGHT ARM  Final   Special Requests   Final    BOTTLES DRAWN AEROBIC AND ANAEROBIC Blood Culture results may not be optimal due to an excessive volume of blood received in culture bottles   Culture  Setup Time   Final    GRAM NEGATIVE RODS AEROBIC BOTTLE ONLY CRITICAL RESULT CALLED TO, READ BACK BY AND VERIFIED WITH: Konrad Saha AT 9892 11/12/2018 BY L BENFIELD Performed at [facility], [place] [address], [place], [place] [address]    Culture GRAM NEGATIVE RODS  Final   Report Status PENDING  Incomplete  Blood Culture ID Panel (Reflexed)     Status: Abnormal   Collection Time: 11/11/18 10:37 PM  Result Value Ref Range Status   Enterococcus species NOT DETECTED NOT DETECTED Final   Listeria monocytogenes NOT DETECTED NOT DETECTED Final   Staphylococcus species NOT DETECTED NOT DETECTED Final   Staphylococcus aureus (BCID) NOT DETECTED NOT DETECTED Final   Streptococcus species NOT DETECTED NOT DETECTED Final   Streptococcus agalactiae NOT DETECTED NOT DETECTED Final   Streptococcus pneumoniae NOT DETECTED NOT DETECTED Final   Streptococcus pyogenes NOT DETECTED NOT  DETECTED Final   Acinetobacter baumannii NOT DETECTED NOT DETECTED Final   Enterobacteriaceae species DETECTED (A) NOT DETECTED Final    Comment: Enterobacteriaceae represent a large family of gram-negative bacteria, not a single organism. CRITICAL RESULT CALLED TO, READ BACK BY AND VERIFIED WITH: M BITONTI,PHARMD AT 7408 11/12/2018 BY L BENFIELD    Enterobacter cloacae complex  NOT DETECTED NOT DETECTED Final   Escherichia coli NOT DETECTED NOT DETECTED Final   Klebsiella oxytoca NOT DETECTED NOT DETECTED Final   Klebsiella pneumoniae DETECTED (A) NOT DETECTED Final    Comment: CRITICAL RESULT CALLED TO, READ BACK BY AND VERIFIED WITH: M BITONTI,PHARMD AT 1938 11/12/2018 BY L BENFIELD    Proteus species NOT DETECTED NOT DETECTED Final   Serratia marcescens NOT DETECTED NOT DETECTED Final   Carbapenem resistance DETECTED (A) NOT DETECTED Final    Comment: CRITICAL RESULT CALLED TO, READ BACK BY AND VERIFIED WITH: M BITONTI,PHARMD AT 1938 11/12/2018 BY L BENFIELD    Haemophilus influenzae NOT DETECTED NOT DETECTED Final   Neisseria meningitidis NOT DETECTED NOT DETECTED Final   Pseudomonas aeruginosa NOT DETECTED NOT DETECTED Final   Candida albicans NOT DETECTED NOT DETECTED Final   Candida glabrata NOT DETECTED NOT DETECTED Final   Candida krusei NOT DETECTED NOT DETECTED Final   Candida parapsilosis NOT DETECTED NOT DETECTED Final   Candida tropicalis NOT DETECTED NOT DETECTED Final    Comment: Performed at [facility], [place] [address], [place], [place] [address]  Blood Culture (routine x 2)     Status: None (Preliminary result)   Collection Time: 11/11/18 10:39 PM   Specimen: BLOOD RIGHT HAND  Result Value Ref Range Status   Specimen Description BLOOD RIGHT HAND  Final   Special Requests   Final    BOTTLES DRAWN AEROBIC ONLY Blood Culture adequate volume   Culture   Final    NO GROWTH < 24 HOURS Performed at [facility], [place] [address] [place], [place] [address]     Report Status PENDING  Incomplete  C difficile quick scan w PCR reflex     Status: None   Collection Time: 11/12/18  2:09 AM   Specimen: STOOL  Result Value Ref Range Status   C Diff antigen NEGATIVE NEGATIVE Final   C Diff toxin NEGATIVE NEGATIVE Final   C Diff interpretation No C. difficile detected.  Final    Comment: Performed at [facility], [place] [address], [place], [place] [address]  Culture, respiratory (tracheal aspirate)     Status: None (Preliminary result)   Collection Time: 11/12/18  3:47 AM   Specimen: Tracheal Aspirate; Respiratory  Result Value Ref Range Status   Specimen Description TRACHEAL ASPIRATE  Final   Special Requests NONE  Final   Gram Stain   Final    FEW WBC PRESENT, PREDOMINANTLY PMN MODERATE GRAM NEGATIVE RODS FEW GRAM POSITIVE COCCI IN PAIRS RARE GRAM POSITIVE RODS    Culture   Final    CULTURE REINCUBATED FOR BETTER GROWTH Performed at [facility], [place] [address], [place], [place] [address]    Report Status PENDING  Incomplete    Michel Bickers, MD [facility] [place] [facility] [phone] pager   [phone] cell 11/13/2018, 12:09 PM

## 2018-11-14 DIAGNOSIS — J9621 Acute and chronic respiratory failure with hypoxia: Secondary | ICD-10-CM

## 2018-11-14 LAB — CULTURE, BLOOD (ROUTINE X 2)

## 2018-11-14 LAB — MAGNESIUM
Magnesium: 2.6 mg/dL — ABNORMAL HIGH (ref 1.7–2.4)
Magnesium: 2.5 mg/dL — ABNORMAL HIGH (ref 1.7–2.4)

## 2018-11-14 LAB — GLUCOSE, CAPILLARY
Glucose-Capillary: 146 mg/dL — ABNORMAL HIGH (ref 70–99)
Glucose-Capillary: 121 mg/dL — ABNORMAL HIGH (ref 70–99)
Glucose-Capillary: 195 mg/dL — ABNORMAL HIGH (ref 70–99)
Glucose-Capillary: 193 mg/dL — ABNORMAL HIGH (ref 70–99)
Glucose-Capillary: 152 mg/dL — ABNORMAL HIGH (ref 70–99)

## 2018-11-14 LAB — CARBAPENEM RESISTANCE PANEL
Carba Resistance IMP Gene: NOT DETECTED
Carba Resistance KPC Gene: DETECTED — AB
Carba Resistance NDM Gene: NOT DETECTED
Carba Resistance OXA48 Gene: NOT DETECTED
Carba Resistance VIM Gene: NOT DETECTED

## 2018-11-14 LAB — PHOSPHORUS
Phosphorus: 2.5 mg/dL (ref 2.5–4.6)
Phosphorus: 1.8 mg/dL — ABNORMAL LOW (ref 2.5–4.6)

## 2018-11-14 LAB — BASIC METABOLIC PANEL
Anion gap: 17 — ABNORMAL HIGH (ref 5–15)
BUN: 73 mg/dL — ABNORMAL HIGH (ref 8–23)
CO2: 18 mmol/L — ABNORMAL LOW (ref 22–32)
Calcium: 8.8 mg/dL — ABNORMAL LOW (ref 8.9–10.3)
Chloride: 108 mmol/L (ref 98–111)
Creatinine, Ser: 1.31 mg/dL — ABNORMAL HIGH (ref 0.44–1.00)
GFR calc Af Amer: 44 mL/min — ABNORMAL LOW (ref >60)
GFR calc non Af Amer: 38 mL/min — ABNORMAL LOW (ref >60)
Glucose, Bld: 320 mg/dL — ABNORMAL HIGH (ref 70–99)
Potassium: 2.9 mmol/L — ABNORMAL LOW (ref 3.5–5.1)
Sodium: 143 mmol/L (ref 135–145)

## 2018-11-14 LAB — MRSA PCR SCREENING: MRSA by PCR: NEGATIVE

## 2018-11-14 MED ORDER — POTASSIUM CHLORIDE 20 MEQ/15ML (10%) PO SOLN
40.0000 meq | ORAL | Status: AC
  Administered 2018-11-14 (×2): 40 meq via ORAL
  Filled 2018-11-14 (×2): qty 30

## 2018-11-14 MED ORDER — PANTOPRAZOLE SODIUM 40 MG PO PACK
40.0000 mg | PACK | Freq: Every day | ORAL | Status: DC
  Administered 2018-11-14 – 2018-11-29 (×16): 40 mg
  Filled 2018-11-14 (×16): qty 20

## 2018-11-14 MED ORDER — FUROSEMIDE 10 MG/ML IJ SOLN
40.0000 mg | Freq: Once | INTRAMUSCULAR | Status: AC
  Administered 2018-11-14: 40 mg via INTRAVENOUS
  Filled 2018-11-14: qty 4

## 2018-11-14 MED ORDER — POTASSIUM CHLORIDE CRYS ER 20 MEQ PO TBCR
40.0000 meq | EXTENDED_RELEASE_TABLET | ORAL | Status: DC
  Filled 2018-11-14: qty 2

## 2018-11-14 NOTE — Progress Notes (Signed)
Patient ID: Melissa Cochran, female   DOB: 05-20-1935, 83 y.o.   MRN: [id number]          [facility] for Infectious Disease    Date of Admission:  11/11/2018   Day 2 ceftazidime avibactam         [name]'s shock has resolved and she is afebrile.  She has CRE Klebsiella bacteremia complicating a urinary tract infection.  I recommend 7 days of total antibiotic therapy with ceftazidime avibactam through 11/20/2018.  Please call if we can be of further assistance while she is here.         Michel Bickers, MD [facility] for [facility] [place] [facility] [phone] pager   [phone] cell 11/14/2018, 10:22 AM

## 2018-11-14 NOTE — Progress Notes (Addendum)
NAME:  Melissa Cochran, MRN:  [id number], DOB:  09/01/1935, LOS: 2 ADMISSION DATE:  11/11/2018, CONSULTATION DATE: 11/11/2018 REFERRING MD: ED, CHIEF COMPLAINT: Sepsis  Brief History   83 year old woman with history of chronic hypoxic respiratory failure with ventilator/PEG tube dependence, CAD, HFrEF (EF 45-50% 06/2017), paroxysmal atrial fibrillation, adnexal mass, COPD, CVA with chronic encephalopathy, seizure disorder, DVT, GERD, hypertension, recurrent UTI presenting from [facility] with acute on chronic encephalopathy and hypotension found to have UTI.  History of present illness   83 year old woman with history of chronic hypoxic respiratory failure with ventilator/PEG tube dependence, CAD, HFrEF (EF 45-50% 06/2017), paroxysmal atrial fibrillation, adnexal mass, COPD, CVA with chronic encephalopathy, seizure disorder, DVT, GERD, hypertension, recurrent UTI presenting from [facility] with acute on chronic encephalopathy and hypotension.  Per EMR her baseline level of consciousness is eyes open and tracking.  Per daughter, she was corresponding with the [facility] nurses.  There were concerns on day of admission for fever, tachycardia.  At one point, she was reported to appear gray.  There was also report of emesis.  Past Medical History  Chronic hypoxic respiratory failure with ventilator/PEG tube dependence, CAD, HFrEF (EF 45-50% 06/2017), paroxysmal atrial fibrillation, adnexal mass, COPD, CVA with chronic encephalopathy, seizure disorder, DVT, GERD, hypertension, recurrent UTI  Significant Hospital Events   7/5> admit  Consults:  7/5: Palliaitve: [place] 7/6: Infectious Disease: CRE   Procedures:  none  Significant Diagnostic Tests:  CXR 7/4> Improvement in bilateral multifocal airspace disease with mild residual streaky opacities at the bases. Tracheostomy tube and left upper extremity PICC remain in place.   CT Chest/ab/pelvis 7/5: 1. Extensive patchy tree-in-bud opacities and scattered  centrilobular nodularity throughout both lungs with associated scattered mild bronchiectasis. No appreciable interval change since 09/27/2018 CT. Differential includes recurrent aspiration and/or atypical mycobacterial infection (MAI). 2. Dense consolidation with air bronchograms at the lung bases bilaterally, unchanged, favor atelectasis and/or postinfectious/postinflammatory scarring. 3. Well-positioned tracheostomy tube, left PICC and percutaneous gastrostomy and rectal tubes. 4. Mild mediastinal lymphadenopathy is stable and probably reactive. 5. Trace dependent left pleural effusion. 6. No evidence of bowel obstruction. Fluid levels scattered in the large bowel, indicative of a nonspecific diarrheal/malabsorptive state. Mild sigmoid diverticulosis, with no evidence of acute diverticulitis. 7. Mildly irregular liver surface, cannot exclude cirrhosis. 8. Stable multilocular 9.0 cm left adnexal cystic mass, cannot exclude left ovarian neoplasm. 9. Nonobstructive bilateral nephrolithiasis. 10.  Aortic Atherosclerosis (ICD10-I70.0).   Micro Data:  Blood cx 7/4> +Kleb pna, +enterbacteraceia, +CRE Urine cx 7/4> >100,000 colonies proteus mirabilis, 70,000 klebsiella Covid Negative  C diff negative  Resp Cx 7/5  Antimicrobials:  Vanc 7/4>7/5 Meropenem 7/4>7/5  ceftaz-Avabactam 7/5  Interim history/subjective:   Remains on mechanical life support.  Afebrile overnight and weaned off vasopressors at 0000.  Objective   Blood pressure 127/69, pulse (!) 103, temperature 99.2 F (37.3 C), temperature source Oral, resp. rate 20, height 5\' 7"  (1.702 m), weight 63.4 kg, SpO2 98 %.    Vent Mode: PCV FiO2 (%):  [40 %] 40 % Set Rate:  [20 bmp] 20 bmp PEEP:  [5 cmH20] 5 cmH20 Plateau Pressure:  [22 cmH20-29 cmH20] 25 cmH20   Intake/Output Summary (Last 24 hours) at 11/14/2018 0847 Last data filed at 11/14/2018 0800 Gross per 24 hour  Intake 2449.41 ml  Output 1190 ml  Net 1259.41 ml    UOP 535cc on 7/6  Filed Weights   11/12/18 0200 11/13/18 0740 11/14/18 0500  Weight: 72.7 kg 71.9 kg 63.4 kg  Physical Exam:  General: 83 y.o. female chronically ill, trach, vent dependent HEENT: Trach in place Cardio: irregularly irregular Lungs: bilateral vented breath sounds Abdomen: distended Extremities: No edema Neuro: no response to pain, voice   Assessment & Plan:   Septic Shock from presumed urinary tract infection  Possible ischemic bowel related to shock and supply/demand ischemia  MDRO Bacteremia, CRE Klebsiella pneumonia  Multiorgan Failure secondary to above  - cont ceftaz-avibactam (7/6- ) - ID consulted, appreciate recs - goal MAP >75, restart levophed as needed to achieve   Acute on Chronic Respiratory Failure, vent dependent respiratory failure  - full vent support - baseline vent dependent - Wean FiO2 to maintain SpO2 >90  Hyponatremia: Resolved AGMA Lactic Acidosis  Acute renal failure  - making some urine  - continue to monitor  Transaminitis: improving Acute shock liver - 2/2 shock, plan per above - continue to observe - recheck CMP in AM   Normocytic Anemia, Hx IDA Hgb trending down 12.5>10.1>9.3.  No active bleed noted. - continue to monitor - hold iron in setting of active infection  Hypokalemia - replete - cont to monitor  Seizure history: - iv AEDs, dilantin, keppra, valpro   CAD: - continue asa   COPD: - continue duonebs   Severe protein calorie malnutrition  - continue tube feeds - nutrition consulted - f/u zinc, vit C levels  Goals of care Per palliative discussion with daughter, remains full code with full aggressive treatment  Best practice:  Diet: NG tube Pain/Anxiety/Delirium protocol (if indicated): Fentanyl PRN VAP protocol (if indicated): Ordered DVT prophylaxis: SubQ Lovenox GI prophylaxis: Protonix Glucose control: Monitor Mobility: PT when able Code Status: Full code Family Communication: will  update daughter Disposition: ICU    [name], D.O.  PGY-2 Family Medicine  11/14/2018 8:47 AM   PCCM Attending :   83 yo chronically ill, trach and vent dependent, present with GNR, kleb pna CRE bactermia, septic shock, was on Nepi and vaso, + hydrocortisone. Now weaned from vasopressors but remains on life support.   Making urine. 535cc yesterday  BP 127/69   Pulse (!) 103   Temp 99.2 F (37.3 C) (Oral)   Resp 20   Ht 5\' 7"  (1.702 m)   Wt 63.4 kg   SpO2 98%   BMI 21.89 kg/m   Gen: chronically critically ill, on mechanical life support  HENT: trach in place Heart: irregular, tachy, s1 s2, no mrg  Lungs: bl vented breath sounds Abd: distended, BS present, peg in place  Ext: no edema   A: Septic shock, resolved  GNR bacteremia, UTI, Kleb pneumonia, CRE, proteus  Multiorgan failure secondary to above  Hyponatremia  AGMA  Lactic acidosis, acute renal failure, stable Transaminitis secondary to above  Anemia  Seizure history  CAD COPD  Severe protein calorie malnutrition  Hypokalemia  In need of ongoing palliative care   P:  Continue ceftaz-avabactam per ID  Lasix 40mg  X1  Follow up UOP  Continue AEDs  Wean from vent as tolerated  duonebs scheduled  Transfer from the ICU to [place], DO [facility] 11/14/2018 9:21 AM  Personal pager: [phone] If unanswered, please page CCM On-call: [phone]

## 2018-11-15 LAB — CBC WITH DIFFERENTIAL/PLATELET
Abs Immature Granulocytes: 0.06 10*3/uL (ref 0.00–0.07)
Basophils Absolute: 0 10*3/uL (ref 0.0–0.1)
Basophils Relative: 0 %
Eosinophils Absolute: 0 10*3/uL (ref 0.0–0.5)
Eosinophils Relative: 0 %
HCT: 31.4 % — ABNORMAL LOW (ref 36.0–46.0)
Hemoglobin: 9.2 g/dL — ABNORMAL LOW (ref 12.0–15.0)
Immature Granulocytes: 1 %
Lymphocytes Relative: 12 %
Lymphs Abs: 0.9 10*3/uL (ref 0.7–4.0)
MCH: 28.3 pg (ref 26.0–34.0)
MCHC: 29.3 g/dL — ABNORMAL LOW (ref 30.0–36.0)
MCV: 96.6 fL (ref 80.0–100.0)
Monocytes Absolute: 0.5 10*3/uL (ref 0.1–1.0)
Monocytes Relative: 7 %
Neutro Abs: 6.2 10*3/uL (ref 1.7–7.7)
Neutrophils Relative %: 80 %
Platelets: 180 10*3/uL (ref 150–400)
RBC: 3.25 MIL/uL — ABNORMAL LOW (ref 3.87–5.11)
RDW: 17.6 % — ABNORMAL HIGH (ref 11.5–15.5)
WBC: 7.8 10*3/uL (ref 4.0–10.5)
nRBC: 0.3 % — ABNORMAL HIGH (ref 0.0–0.2)

## 2018-11-15 LAB — GLUCOSE, CAPILLARY
Glucose-Capillary: 143 mg/dL — ABNORMAL HIGH (ref 70–99)
Glucose-Capillary: 144 mg/dL — ABNORMAL HIGH (ref 70–99)
Glucose-Capillary: 156 mg/dL — ABNORMAL HIGH (ref 70–99)
Glucose-Capillary: 163 mg/dL — ABNORMAL HIGH (ref 70–99)
Glucose-Capillary: 168 mg/dL — ABNORMAL HIGH (ref 70–99)
Glucose-Capillary: 172 mg/dL — ABNORMAL HIGH (ref 70–99)
Glucose-Capillary: 181 mg/dL — ABNORMAL HIGH (ref 70–99)

## 2018-11-15 LAB — COMPREHENSIVE METABOLIC PANEL
ALT: 646 U/L — ABNORMAL HIGH (ref 0–44)
AST: 332 U/L — ABNORMAL HIGH (ref 15–41)
Albumin: 1.8 g/dL — ABNORMAL LOW (ref 3.5–5.0)
Alkaline Phosphatase: 120 U/L (ref 38–126)
Anion gap: 12 (ref 5–15)
BUN: 80 mg/dL — ABNORMAL HIGH (ref 8–23)
CO2: 16 mmol/L — ABNORMAL LOW (ref 22–32)
Calcium: 8.7 mg/dL — ABNORMAL LOW (ref 8.9–10.3)
Chloride: 126 mmol/L — ABNORMAL HIGH (ref 98–111)
Creatinine, Ser: 0.95 mg/dL (ref 0.44–1.00)
GFR calc Af Amer: >60 mL/min (ref >60)
GFR calc non Af Amer: 56 mL/min — ABNORMAL LOW (ref >60)
Glucose, Bld: 192 mg/dL — ABNORMAL HIGH (ref 70–99)
Potassium: 3 mmol/L — ABNORMAL LOW (ref 3.5–5.1)
Sodium: 154 mmol/L — ABNORMAL HIGH (ref 135–145)
Total Bilirubin: 1 mg/dL (ref 0.3–1.2)
Total Protein: 5.8 g/dL — ABNORMAL LOW (ref 6.5–8.1)

## 2018-11-15 LAB — URINE CULTURE: Culture: >=100000 — AB

## 2018-11-15 LAB — VITAMIN C: Vitamin C: 1.4 mg/dL (ref 0.4–2.0)

## 2018-11-15 LAB — ZINC: Zinc: 25 ug/dL — ABNORMAL LOW (ref 56–134)

## 2018-11-15 MED ORDER — ZINC SULFATE 220 (50 ZN) MG PO CAPS
220.0000 mg | ORAL_CAPSULE | Freq: Every day | ORAL | Status: DC
  Administered 2018-11-15 – 2018-11-29 (×15): 220 mg
  Filled 2018-11-15 (×15): qty 1

## 2018-11-15 MED ORDER — ENOXAPARIN SODIUM 40 MG/0.4ML ~~LOC~~ SOLN
40.0000 mg | SUBCUTANEOUS | Status: DC
  Administered 2018-11-15 – 2018-11-19 (×5): 40 mg via SUBCUTANEOUS
  Filled 2018-11-15 (×5): qty 0.4

## 2018-11-15 MED ORDER — FREE WATER
300.0000 mL | Freq: Three times a day (TID) | Status: DC
  Administered 2018-11-15 – 2018-11-22 (×21): 300 mL

## 2018-11-15 MED ORDER — POTASSIUM CHLORIDE 20 MEQ/15ML (10%) PO SOLN
40.0000 meq | ORAL | Status: AC
  Administered 2018-11-15 (×2): 40 meq via ORAL
  Filled 2018-11-15 (×2): qty 30

## 2018-11-15 MED ORDER — K PHOS MONO-SOD PHOS DI & MONO 155-852-130 MG PO TABS
500.0000 mg | ORAL_TABLET | Freq: Two times a day (BID) | ORAL | Status: DC
  Administered 2018-11-15 – 2018-11-16 (×3): 500 mg via ORAL
  Filled 2018-11-15 (×3): qty 2

## 2018-11-15 MED ORDER — SODIUM CHLORIDE 0.45 % IV SOLN
INTRAVENOUS | Status: DC
  Administered 2018-11-15: 11:00:00 via INTRAVENOUS

## 2018-11-15 NOTE — Plan of Care (Signed)

## 2018-11-15 NOTE — Progress Notes (Signed)
Nutrition Follow-up  DOCUMENTATION CODES:   Not applicable  INTERVENTION:   Tube Feeding: Pivot 1.5 at 60 ml/hr Provides 135 g of protein, 2160 kcals, 1094 mL Meets 100% estimated calorie and protein needs  Continue Juven BID,each packet provides 80 calories, 8 grams of carbohydrate, 2.5 grams of protein (collagen), 7 grams of L-arginine and 7 grams of L-glutamine; supplement contains CaHMB, Vitamins C, E, B12 and Zinc to promote wound healing  Add Zinc 220 mg daily for zinc deficiency  Recommend supplementing phosphorus (Phos <2.0)   NUTRITION DIAGNOSIS:   Increased nutrient needs related to acute illness, chronic illness, wound healing as evidenced by estimated needs.  Being addressed via TF, supplements  GOAL:   Patient will meet greater than or equal to 90% of their needs  Met  MONITOR:   TF tolerance, Labs, Skin, I & O's, Weight trends  REASON FOR ASSESSMENT:   Consult Enteral/tube feeding initiation and management  ASSESSMENT:   83 year old female with chronic hypoxic respiratory failure and ventilator/PEG tube dependence, CAD s/p stent in 1720, chronic systolic heart failure with EF of 45 to 50% by echo on 06/2017, history of CVA, and seizure disorder who presents from [facility] with AMS and hypotension with UTI with septic shock, multiorgan failure   Pt remains on vent support via trach  Tolerating Pivot 1.5 at 60 ml/hr, free water 300 mL q 8 hours ordered today via G-tube  11/13/2018 Zinc 25 (L) - plan to add zinc sulfate supplement 220 mg daily Vitamin C 1.4 (wdl)- pt receiving 600 mg daily from Juven daily in addition to what is provided in TF  Hyppernaremic, free water and IV fluids ordered today  Labs: sodium 154 (H), potassium 3.0 (L), phosphorus 1.8 (L) Meds: IV dilantin, MVI, 1/2 NS at 75 ml/hr, KCl  Diet Order:   Diet Order            Diet NPO time specified  Diet effective now              EDUCATION NEEDS:   No education needs  have been identified at this time  Skin:  Skin Assessment: Skin Integrity Issues: Skin Integrity Issues:: Stage III, Stage IV, Unstageable Stage III: sacrum Stage IV: thoracic spine Unstageable: B/L heel, R. forearm  Last BM:  7/8 rectal tube  Height:   Ht Readings from Last 1 Encounters:  11/12/18 '5\' 7"'$  (1.702 m)    Weight:   Wt Readings from Last 1 Encounters:  11/15/18 67.7 kg    Ideal Body Weight:  61.4 kg  BMI:  Body mass index is 23.38 kg/m.  Estimated Nutritional Needs:   Kcal:  1950-2150 kcals  Protein:  120-145 g  Fluid:  >/= 2 L   Cate Ahmira Boisselle MS, RDN, LDN, CNSC [phone] Pager  [phone] Weekend/On-Call Pager

## 2018-11-15 NOTE — Progress Notes (Addendum)
PROGRESS NOTE    Melissa Cochran  YIF:[id number] DOB: [birth year] DOA: 11/11/2018 PCP: [name], MD   Brief Narrative:  Patient is 83 year old female with history of chronic hypoxic respiratory failure on ventilator through trach collar, PEG, coronary artery  disease, heart failure with reduced ejection fraction of 45 to 50%, proximal A. fib, adnexal mass, COPD, CVA with chronic encephalopathy, seizure disorder, DVT, GERD, hypertension, recurrent UTI who presented from [facility] with acute on chronic encephalopathy and hypotension.  Patient's hospital course was remarkable for septic shock secondary to gram-negative bacteremia, source being urine.  She had to be put on pressure support for septic shock.  Currently off pressors and hemodynamically stable.  ID was following.  Plan is to complete IV antibiotic course before discharge to [facility].   Assessment & Plan:   Principal Problem:   Bacteremia due to Klebsiella pneumoniae Active Problems:   Chronic respiratory failure (HCC)   Sepsis ([place])   UTI (urinary tract infection)   Septic shock: Secondary to gram-negative bacteremia.  Blood cultures for ESBL Klebsiella pneumoniae.  Most likely source is urine. Currently on avibactam.  Plan is to continue antibiotics through 11/20/2018.  ID was following. Currently off pressors.  Hemodynamically stable.  Urinary tract infection: Urine culture showed Proteus mirabilis, Klebsiella pneumonia.  Continue current antibiotics.  Hypernatremia: We will start on half-normal saline and free water.  Check BMP tomorrow.  Hypokalemia :We will supplement with potassium.  Elevated liver enzymes: Thought to be secondary to shock liver.  Liver enzymes slightly increased today.  Check CMP tomorrow.  Continue to monitor.  History of seizure disorder: On IV antiepileptic drugs.  Currently on Dilantin, Keppra, valproic acid  History of coronary disease: On aspirin  COPD: Currently stable.  Continue  bronchodilators as needed.  Normocytic anemia: Due to her chronic multiple comorbidities.  Continue to monitor.  No need of transfusion for now.  Chronic debility: On chronic ventilation.  PCCM also following.  Status post PEG on tube feeding. Multiple discussion has been held with the family without success.  Palliative care was also involved.  Patient remains full code with full aggressive treatment.  Pressure ulcers: She has pressure ulcers on the lumbar region, sacral region, bilateral lower extremities.  Continue supportive care.    Nutrition Problem: Increased nutrient needs Etiology: acute illness, chronic illness, wound healing      DVT prophylaxis: Lovenox Code Status: Full Family Communication: None Disposition Plan: Discharge back to [facility] after completion of antibiotic therapy.   Consultants: PCCM, ID  Procedures: None  Antimicrobials:  Anti-infectives (From admission, onward)   Start     Dose/Rate Route Frequency Ordered Stop   11/13/18 2200  vancomycin (VANCOCIN) 1,250 mg in sodium chloride 0.9 % 250 mL IVPB  Status:  Discontinued     1,250 mg 166.7 mL/hr over 90 Minutes Intravenous Every 48 hours 11/12/18 0144 11/12/18 1028   11/13/18 1800  meropenem (MERREM) 1 g in sodium chloride 0.9 % 100 mL IVPB  Status:  Discontinued     1 g 200 mL/hr over 30 Minutes Intravenous Every 24 hours 11/12/18 1021 11/12/18 1950   11/13/18 1800  ceftazidime-avibactam (AVYCAZ) 1.25 g in dextrose 5 % 50 mL IVPB     1.25 g 25 mL/hr over 2 Hours Intravenous Every 8 hours 11/13/18 1239 11/20/18 2359   11/12/18 2030  ceftazidime-avibactam (AVYCAZ) 0.94 g in dextrose 5 % 50 mL IVPB  Status:  Discontinued     0.94 g 25 mL/hr over 2 Hours  Intravenous Every 12 hours 11/12/18 1950 11/13/18 1239   11/12/18 1200  vancomycin (VANCOCIN) 50 mg/mL oral solution 500 mg  Status:  Discontinued     500 mg Oral Every 6 hours 11/12/18 0848 11/12/18 0909   11/12/18 1028  vancomycin variable dose per  unstable renal function (pharmacist dosing)  Status:  Discontinued      Does not apply See admin instructions 11/12/18 1028 11/12/18 1950   11/12/18 1000  meropenem (MERREM) 1 g in sodium chloride 0.9 % 100 mL IVPB  Status:  Discontinued     1 g 200 mL/hr over 30 Minutes Intravenous Every 12 hours 11/12/18 0144 11/12/18 1021   11/12/18 0900  metroNIDAZOLE (FLAGYL) IVPB 500 mg  Status:  Discontinued     500 mg 100 mL/hr over 60 Minutes Intravenous Every 8 hours 11/12/18 0848 11/12/18 0909   11/11/18 2315  meropenem (MERREM) 2 g in sodium chloride 0.9 % 100 mL IVPB     2 g 200 mL/hr over 30 Minutes Intravenous  Once 11/11/18 2307 11/12/18 0059   11/11/18 2315  vancomycin (VANCOCIN) IVPB 1000 mg/200 mL premix     1,000 mg 200 mL/hr over 60 Minutes Intravenous  Once 11/11/18 2308 11/12/18 0059      Subjective:  Patient seen and examined the bedside this morning.  Tachycardic otherwise hemodynamically stable level. Not in distress.  Unresponsive.  Chronically ill looking.  Objective: Vitals:   11/15/18 0700 11/15/18 0719 11/15/18 0800 11/15/18 0900  BP: (!) 127/53 (!) 127/53 (!) 144/64 135/67  Pulse: (!) 115 (!) 108 (!) 121 (!) 114  Resp: (!) 32 (!) 24 (!) 30 (!) 22  Temp:   98.7 F (37.1 C)   TempSrc:   Oral   SpO2: 98% 99% 97% 97%  Weight:      Height:        Intake/Output Summary (Last 24 hours) at 11/15/2018 0949 Last data filed at 11/15/2018 0800 Gross per 24 hour  Intake 2848.23 ml  Output 1525 ml  Net 1323.23 ml   Filed Weights   11/13/18 0740 11/14/18 0500 11/15/18 0500  Weight: 71.9 kg 63.4 kg 67.7 kg    Examination:  General exam: Chronically ill looking, severely debilitated, unresponsive HEENT:Trach on vent Respiratory system: Bilateral equal air entry, normal vesicular breath sounds, no wheezes or crackles  Cardiovascular system: Sinus tachycardia,. No JVD, murmurs, rubs, gallops or clicks. No pedal edema. Gastrointestinal system: Abdomen is distended, tense  (chronic ). No organomegaly or masses felt. Normal bowel sounds heard.TRectal tube,PEG. Central nervous system: Not Alert and oriented Extremities: No edema, no clubbing ,no cyanosis Skin:  pressure ulcers on the lumbar region, sacral region, bilateral lower extremities    Data Reviewed: I have personally reviewed following labs and imaging studies  CBC: Recent Labs  Lab 11/11/18 2236 11/11/18 2245 11/12/18 0254 11/12/18 1614 11/13/18 0805 11/15/18 0536  WBC 45.6*  --  23.0* 20.3* 14.3* 7.8  NEUTROABS 41.7*  --   --   --  12.2* 6.2  HGB 11.7* 12.9 12.5 10.1* 9.3* 9.2*  HCT 39.3 38.0 41.5 33.1* 30.0* 31.4*  MCV 96.6  --  95.4 93.0 92.3 96.6  PLT 387  --  338 298 264 646   Basic Metabolic Panel: Recent Labs  Lab 11/12/18 0254 11/12/18 1614 11/13/18 0805 11/13/18 1624 11/14/18 0556 11/14/18 1629 11/15/18 0536  NA 132* 135 140  --  143  --  154*  K 5.4* 5.1 3.8  --  2.9*  --  3.0*  CL 97* 105 105  --  108  --  126*  CO2 16* 14* 19*  --  18*  --  16*  GLUCOSE 101* 165* 139*  --  320*  --  192*  BUN 57* 59* 67*  --  73*  --  80*  CREATININE 1.49* 1.55* 1.29*  --  1.31*  --  0.95  CALCIUM 10.2 8.7* 8.7*  --  8.8*  --  8.7*  MG 2.4  --  2.2 2.4 2.6* 2.5*  --   PHOS 3.3  --  3.4 3.2 2.5 1.8*  --    GFR: Estimated Creatinine Clearance: 45.2 mL/min (by C-G formula based on SCr of 0.95 mg/dL). Liver Function Tests: Recent Labs  Lab 11/11/18 2236 11/12/18 0254 11/12/18 1614 11/13/18 0805 11/15/18 0536  AST 216* 558* 382* 298* 332*  ALT 163* 433* 340* 350* 646*  ALKPHOS 180* 180* 110 103 120  BILITOT 1.5* 1.9* 1.4* 1.6* 1.0  PROT 6.2* 6.7 5.3* 5.8* 5.8*  ALBUMIN 2.0* 2.2* 1.6* 1.7* 1.8*   No results for input(s): LIPASE, AMYLASE in the last 168 hours. No results for input(s): AMMONIA in the last 168 hours. Coagulation Profile: Recent Labs  Lab 11/12/18 0254  INR 1.7*   Cardiac Enzymes: No results for input(s): CKTOTAL, CKMB, CKMBINDEX, TROPONINI in the last  168 hours. BNP (last 3 results) No results for input(s): PROBNP in the last 8760 hours. HbA1C: No results for input(s): HGBA1C in the last 72 hours. CBG: Recent Labs  Lab 11/14/18 1605 11/14/18 2050 11/15/18 0052 11/15/18 0436 11/15/18 0736  GLUCAP 193* 152* 181* 168* 144*   Lipid Profile: No results for input(s): CHOL, HDL, LDLCALC, TRIG, CHOLHDL, LDLDIRECT in the last 72 hours. Thyroid Function Tests: No results for input(s): TSH, T4TOTAL, FREET4, T3FREE, THYROIDAB in the last 72 hours. Anemia Panel: No results for input(s): VITAMINB12, FOLATE, FERRITIN, TIBC, IRON, RETICCTPCT in the last 72 hours. Sepsis Labs: Recent Labs  Lab 11/12/18 0641 11/12/18 0959 11/12/18 1407 11/13/18 0805  LATICACIDVEN 9.1* 4.2* 4.5* 3.1*    Recent Results (from the past 240 hour(s))  Carbapenem Resistance Panel     Status: Abnormal   Collection Time: 11/11/18  5:18 PM  Result Value Ref Range Status   Carba Resistance IMP Gene NOT DETECTED NOT DETECTED Final   Carba Resistance VIM Gene NOT DETECTED NOT DETECTED Final   Carba Resistance NDM Gene NOT DETECTED NOT DETECTED Final   Carba Resistance KPC Gene DETECTED (A) NOT DETECTED Final   Carba Resistance OXA48 Gene NOT DETECTED NOT DETECTED Final    Comment: (NOTE) Cepheid Carba-R is an FDA-cleared nucleic acid amplification test  (NAAT)for the detection and differentiation of genes encoding the  most prevalent carbapenemases in bacterial isolate samples. Carbapenemase gene identification and implementation of comprehensive  infection control measures are recommended by the CDC to prevent the  spread of the resistant organisms. Performed at [facility], [place] [address] [place], [place] [address]   SARS Coronavirus 2 (CEPHEID- Performed in [place] hospital lab), Hosp Order     Status: None   Collection Time: 11/11/18 10:04 PM   Specimen: Nasopharyngeal Swab  Result Value Ref Range Status   SARS Coronavirus 2 NEGATIVE  NEGATIVE Final    Comment: (NOTE) If result is NEGATIVE SARS-CoV-2 target nucleic acids are NOT DETECTED. The SARS-CoV-2 RNA is generally detectable in upper and lower  respiratory specimens during the acute phase of infection. The lowest  concentration of SARS-CoV-2  viral copies this assay can detect is 250  copies / mL. A negative result does not preclude SARS-CoV-2 infection  and should not be used as the sole basis for treatment or other  patient management decisions.  A negative result may occur with  improper specimen collection / handling, submission of specimen other  than nasopharyngeal swab, presence of viral mutation(s) within the  areas targeted by this assay, and inadequate number of viral copies  (<250 copies / mL). A negative result must be combined with clinical  observations, patient history, and epidemiological information. If result is POSITIVE SARS-CoV-2 target nucleic acids are DETECTED. The SARS-CoV-2 RNA is generally detectable in upper and lower  respiratory specimens dur ing the acute phase of infection.  Positive  results are indicative of active infection with SARS-CoV-2.  Clinical  correlation with patient history and other diagnostic information is  necessary to determine patient infection status.  Positive results do  not rule out bacterial infection or co-infection with other viruses. If result is PRESUMPTIVE POSTIVE SARS-CoV-2 nucleic acids MAY BE PRESENT.   A presumptive positive result was obtained on the submitted specimen  and confirmed on repeat testing.  While 2019 novel coronavirus  (SARS-CoV-2) nucleic acids may be present in the submitted sample  additional confirmatory testing may be necessary for epidemiological  and / or clinical management purposes  to differentiate between  SARS-CoV-2 and other Sarbecovirus currently known to infect humans.  If clinically indicated additional testing with an alternate test  methodology [phone]) is  advised. The SARS-CoV-2 RNA is generally  detectable in upper and lower respiratory sp ecimens during the acute  phase of infection. The expected result is Negative. Fact Sheet for Patients:  StrictlyIdeas.no Fact Sheet for Healthcare Providers: BankingDealers.co.za This test is not yet approved or cleared by the [place] FDA and has been authorized for detection and/or diagnosis of SARS-CoV-2 by FDA under an Emergency Use Authorization (EUA).  This EUA will remain in effect (meaning this test can be used) for the duration of the COVID-19 declaration under Section 564(b)(1) of the Act, 21 U.S.C. section 360bbb-3(b)(1), unless the authorization is terminated or revoked sooner. Performed at [facility], [place] [address] [place], [place] [address]   Urine culture     Status: Abnormal   Collection Time: 11/11/18 10:17 PM   Specimen: Urine, Catheterized  Result Value Ref Range Status   Specimen Description URINE, CATHETERIZED  Final   Special Requests NONE  Final   Culture (A)  Final    >=100,000 COLONIES/mL PROTEUS MIRABILIS 70,000 COLONIES/mL KLEBSIELLA PNEUMONIAE PREVIOUS CRC POSITIVE IN BLOOD CULTURE Performed at [facility], [place] [address], [place], [place] [address]    Report Status 11/15/2018 FINAL  Final   Organism ID, Bacteria PROTEUS MIRABILIS (A)  Final   Organism ID, Bacteria KLEBSIELLA PNEUMONIAE (A)  Final      Susceptibility   Proteus mirabilis - MIC*    AMPICILLIN >=32 RESISTANT Resistant     CEFAZOLIN >=64 RESISTANT Resistant     CEFTRIAXONE >=64 RESISTANT Resistant     CIPROFLOXACIN >=4 RESISTANT Resistant     GENTAMICIN <=1 SENSITIVE Sensitive     IMIPENEM 2 SENSITIVE Sensitive     NITROFURANTOIN 128 RESISTANT Resistant     TRIMETH/SULFA <=20 SENSITIVE Sensitive     AMPICILLIN/SULBACTAM 8 SENSITIVE Sensitive     PIP/TAZO <=4 SENSITIVE Sensitive     * >=100,000 COLONIES/mL PROTEUS MIRABILIS   Blood Culture (routine x 2)  Status: Abnormal   Collection Time: 11/11/18 10:37 PM   Specimen: BLOOD RIGHT ARM  Result Value Ref Range Status   Specimen Description BLOOD RIGHT ARM  Final   Special Requests   Final    BOTTLES DRAWN AEROBIC AND ANAEROBIC Blood Culture results may not be optimal due to an excessive volume of blood received in culture bottles   Culture  Setup Time   Final    GRAM NEGATIVE RODS AEROBIC BOTTLE ONLY CRITICAL RESULT CALLED TO, READ BACK BY AND VERIFIED WITH: Konrad Saha AT 7342 11/12/2018 BY L BENFIELD Performed at [facility], [place] [address], [place], [place] [address]    Culture (A)  Final    KLEBSIELLA PNEUMONIAE CONFIRMED CARBAPENEMASE RESISTANT ENTEROBACTERIACAE    Report Status 11/14/2018 FINAL  Final   Organism ID, Bacteria KLEBSIELLA PNEUMONIAE  Final      Susceptibility   Klebsiella pneumoniae - MIC*    AMPICILLIN >=32 RESISTANT Resistant     CEFAZOLIN >=64 RESISTANT Resistant     CEFEPIME 16 INTERMEDIATE Intermediate     CEFTAZIDIME >=64 RESISTANT Resistant     CEFTRIAXONE >=64 RESISTANT Resistant     CIPROFLOXACIN >=4 RESISTANT Resistant     GENTAMICIN 8 INTERMEDIATE Intermediate     IMIPENEM 8 INTERMEDIATE Intermediate     TRIMETH/SULFA >=320 RESISTANT Resistant     AMPICILLIN/SULBACTAM >=32 RESISTANT Resistant     PIP/TAZO >=128 RESISTANT Resistant     Extended ESBL NEGATIVE Sensitive     * KLEBSIELLA PNEUMONIAE  Blood Culture ID Panel (Reflexed)     Status: Abnormal   Collection Time: 11/11/18 10:37 PM  Result Value Ref Range Status   Enterococcus species NOT DETECTED NOT DETECTED Final   Listeria monocytogenes NOT DETECTED NOT DETECTED Final   Staphylococcus species NOT DETECTED NOT DETECTED Final   Staphylococcus aureus (BCID) NOT DETECTED NOT DETECTED Final   Streptococcus species NOT DETECTED NOT DETECTED Final   Streptococcus agalactiae NOT DETECTED NOT DETECTED Final   Streptococcus pneumoniae NOT DETECTED NOT  DETECTED Final   Streptococcus pyogenes NOT DETECTED NOT DETECTED Final   Acinetobacter baumannii NOT DETECTED NOT DETECTED Final   Enterobacteriaceae species DETECTED (A) NOT DETECTED Final    Comment: Enterobacteriaceae represent a large family of gram-negative bacteria, not a single organism. CRITICAL RESULT CALLED TO, READ BACK BY AND VERIFIED WITH: M BITONTI,PHARMD AT 1572 11/12/2018 BY L BENFIELD    Enterobacter cloacae complex NOT DETECTED NOT DETECTED Final   Escherichia coli NOT DETECTED NOT DETECTED Final   Klebsiella oxytoca NOT DETECTED NOT DETECTED Final   Klebsiella pneumoniae DETECTED (A) NOT DETECTED Final    Comment: CRITICAL RESULT CALLED TO, READ BACK BY AND VERIFIED WITH: M BITONTI,PHARMD AT 1938 11/12/2018 BY L BENFIELD    Proteus species NOT DETECTED NOT DETECTED Final   Serratia marcescens NOT DETECTED NOT DETECTED Final   Carbapenem resistance DETECTED (A) NOT DETECTED Final    Comment: CRITICAL RESULT CALLED TO, READ BACK BY AND VERIFIED WITH: M BITONTI,PHARMD AT 1938 11/12/2018 BY L BENFIELD    Haemophilus influenzae NOT DETECTED NOT DETECTED Final   Neisseria meningitidis NOT DETECTED NOT DETECTED Final   Pseudomonas aeruginosa NOT DETECTED NOT DETECTED Final   Candida albicans NOT DETECTED NOT DETECTED Final   Candida glabrata NOT DETECTED NOT DETECTED Final   Candida krusei NOT DETECTED NOT DETECTED Final   Candida parapsilosis NOT DETECTED NOT DETECTED Final   Candida tropicalis NOT DETECTED NOT DETECTED Final    Comment: Performed at  [facility], [place] [address], [place], [place] [address]  Blood Culture (routine x 2)     Status: None (Preliminary result)   Collection Time: 11/11/18 10:39 PM   Specimen: BLOOD RIGHT HAND  Result Value Ref Range Status   Specimen Description BLOOD RIGHT HAND  Final   Special Requests   Final    BOTTLES DRAWN AEROBIC ONLY Blood Culture adequate volume   Culture   Final    NO GROWTH 3 DAYS Performed at [facility], [place] [address], [place], [place] [address]    Report Status PENDING  Incomplete  C difficile quick scan w PCR reflex     Status: None   Collection Time: 11/12/18  2:09 AM   Specimen: STOOL  Result Value Ref Range Status   C Diff antigen NEGATIVE NEGATIVE Final   C Diff toxin NEGATIVE NEGATIVE Final   C Diff interpretation No C. difficile detected.  Final    Comment: Performed at [facility], [place] [address] [place], [place] [address]  Culture, respiratory (tracheal aspirate)     Status: None (Preliminary result)   Collection Time: 11/12/18  3:47 AM   Specimen: Tracheal Aspirate; Respiratory  Result Value Ref Range Status   Specimen Description TRACHEAL ASPIRATE  Final   Special Requests NONE  Final   Gram Stain   Final    FEW WBC PRESENT, PREDOMINANTLY PMN MODERATE GRAM NEGATIVE RODS FEW GRAM POSITIVE COCCI IN PAIRS RARE GRAM POSITIVE RODS    Culture   Final    MODERATE GRAM NEGATIVE RODS CULTURE REINCUBATED FOR BETTER GROWTH Performed at [facility], [place] [address], [place], [place] [address]    Report Status PENDING  Incomplete  MRSA PCR Screening     Status: None   Collection Time: 11/14/18  9:19 AM   Specimen: Nasal Mucosa; Nasopharyngeal  Result Value Ref Range Status   MRSA by PCR NEGATIVE NEGATIVE Final    Comment:        The GeneXpert MRSA Assay (FDA approved for NASAL specimens only), is one component of a comprehensive MRSA colonization surveillance program. It is not intended to diagnose MRSA infection nor to guide or monitor treatment for MRSA infections. Performed at [facility], [place] [address] [place], [place] [address]          Radiology Studies: No results found.      Scheduled Meds: . aspirin  81 mg Per Tube Daily  . chlorhexidine gluconate (MEDLINE KIT)  15 mL Mouth Rinse BID  . Chlorhexidine Gluconate Cloth  6 each Topical Daily  . enoxaparin (LOVENOX) injection  40 mg Subcutaneous Q24H  . mouth rinse   15 mL Mouth Rinse 10 times per day  . midodrine  5 mg Per Tube TID WC  . multivitamin  15 mL Per Tube Daily  . nutrition supplement (JUVEN)  1 packet Oral BID  . pantoprazole sodium  40 mg Per Tube Daily  . phenytoin  100 mg Intravenous BID   Continuous Infusions: . ceftazidime avibactam (AVYCAZ) IVPB 1.25 g (11/15/18 0936)  . feeding supplement (PIVOT 1.5 CAL) 1,000 mL (11/15/18 0343)  . levETIRAcetam Stopped (11/15/18 0550)  . norepinephrine (LEVOPHED) Adult infusion Stopped (11/14/18 0057)  . phenytoin (DILANTIN) IV Stopped (11/14/18 2257)  . valproate sodium Stopped (11/15/18 2500)     LOS: 3 days    Time spent: 35 mins.      Shelly Coss, MD [facility] Pager [phone]  If 7PM-7AM, please contact night-coverage [email or web address] Password [facility] 11/15/2018, 9:49 AM

## 2018-11-16 LAB — BASIC METABOLIC PANEL
BUN: 81 mg/dL — ABNORMAL HIGH (ref 8–23)
CO2: 18 mmol/L — ABNORMAL LOW (ref 22–32)
Calcium: 9.2 mg/dL (ref 8.9–10.3)
Chloride: >130 mmol/L (ref 98–111)
Creatinine, Ser: 0.65 mg/dL (ref 0.44–1.00)
GFR calc Af Amer: >60 mL/min (ref >60)
GFR calc non Af Amer: >60 mL/min (ref >60)
Glucose, Bld: 147 mg/dL — ABNORMAL HIGH (ref 70–99)
Potassium: 4.1 mmol/L (ref 3.5–5.1)
Sodium: 160 mmol/L — ABNORMAL HIGH (ref 135–145)

## 2018-11-16 LAB — HEPATIC FUNCTION PANEL
ALT: 424 U/L — ABNORMAL HIGH (ref 0–44)
AST: 118 U/L — ABNORMAL HIGH (ref 15–41)
Albumin: 1.8 g/dL — ABNORMAL LOW (ref 3.5–5.0)
Alkaline Phosphatase: 144 U/L — ABNORMAL HIGH (ref 38–126)
Bilirubin, Direct: 0.6 mg/dL — ABNORMAL HIGH (ref 0.0–0.2)
Indirect Bilirubin: 0.6 mg/dL (ref 0.3–0.9)
Total Bilirubin: 1.2 mg/dL (ref 0.3–1.2)
Total Protein: 5.7 g/dL — ABNORMAL LOW (ref 6.5–8.1)

## 2018-11-16 LAB — CULTURE, BLOOD (ROUTINE X 2)
Culture: NO GROWTH
Special Requests: ADEQUATE

## 2018-11-16 LAB — GLUCOSE, CAPILLARY
Glucose-Capillary: 152 mg/dL — ABNORMAL HIGH (ref 70–99)
Glucose-Capillary: 142 mg/dL — ABNORMAL HIGH (ref 70–99)
Glucose-Capillary: 151 mg/dL — ABNORMAL HIGH (ref 70–99)
Glucose-Capillary: 139 mg/dL — ABNORMAL HIGH (ref 70–99)
Glucose-Capillary: 152 mg/dL — ABNORMAL HIGH (ref 70–99)
Glucose-Capillary: 162 mg/dL — ABNORMAL HIGH (ref 70–99)

## 2018-11-16 LAB — CULTURE, RESPIRATORY W GRAM STAIN

## 2018-11-16 LAB — PHOSPHORUS: Phosphorus: <1 mg/dL — CL (ref 2.5–4.6)

## 2018-11-16 MED ORDER — K PHOS MONO-SOD PHOS DI & MONO 155-852-130 MG PO TABS
500.0000 mg | ORAL_TABLET | Freq: Two times a day (BID) | ORAL | Status: DC
  Administered 2018-11-16 – 2018-11-29 (×26): 500 mg
  Filled 2018-11-16 (×26): qty 2

## 2018-11-16 MED ORDER — INSULIN ASPART 100 UNIT/ML ~~LOC~~ SOLN
0.0000 [IU] | SUBCUTANEOUS | Status: DC
  Administered 2018-11-16: 2 [IU] via SUBCUTANEOUS
  Administered 2018-11-16 (×2): 1 [IU] via SUBCUTANEOUS
  Administered 2018-11-16 (×2): 2 [IU] via SUBCUTANEOUS
  Administered 2018-11-17 – 2018-11-22 (×13): 1 [IU] via SUBCUTANEOUS
  Administered 2018-11-22: 21:00:00 2 [IU] via SUBCUTANEOUS
  Administered 2018-11-23 – 2018-11-29 (×11): 1 [IU] via SUBCUTANEOUS

## 2018-11-16 MED ORDER — METOPROLOL TARTRATE 12.5 MG HALF TABLET
12.5000 mg | ORAL_TABLET | Freq: Two times a day (BID) | ORAL | Status: DC
  Administered 2018-11-16: 12.5 mg
  Filled 2018-11-16: qty 1

## 2018-11-16 MED ORDER — METOPROLOL TARTRATE 25 MG/10 ML ORAL SUSPENSION
12.5000 mg | Freq: Two times a day (BID) | ORAL | Status: DC
  Administered 2018-11-16 – 2018-11-29 (×25): 12.5 mg
  Filled 2018-11-16 (×26): qty 10

## 2018-11-16 MED ORDER — JUVEN PO PACK
1.0000 | PACK | Freq: Two times a day (BID) | ORAL | Status: DC
  Administered 2018-11-17 – 2018-11-29 (×25): 1
  Filled 2018-11-16 (×25): qty 1

## 2018-11-16 MED ORDER — ACETAMINOPHEN 160 MG/5ML PO SOLN
650.0000 mg | ORAL | Status: DC | PRN
  Administered 2018-11-16 – 2018-11-18 (×5): 650 mg
  Filled 2018-11-16 (×6): qty 20.3

## 2018-11-16 MED ORDER — DEXTROSE 5 % IV SOLN
INTRAVENOUS | Status: DC
  Administered 2018-11-16 – 2018-11-18 (×4): via INTRAVENOUS

## 2018-11-16 MED ORDER — SODIUM PHOSPHATES 45 MMOLE/15ML IV SOLN
30.0000 mmol | Freq: Once | INTRAVENOUS | Status: AC
  Administered 2018-11-16: 09:00:00 30 mmol via INTRAVENOUS
  Filled 2018-11-16: qty 10

## 2018-11-16 NOTE — Progress Notes (Signed)
MD added metoprolol for A-fib. I gave metoprolol to see how patient tolerates. Patients takes midodrine during the day. Paged MD regarding BP 90/45 (58) after metoprolol. MD said to hold metoprolol is BP is low. Per MD, if less than 100/60 then DC. BP currently 102/51 (66) and midodrine due. Will continue to monitor.   Dewaine Oats, RN

## 2018-11-16 NOTE — Progress Notes (Addendum)
PROGRESS NOTE    Melissa Cochran  BXU:[id number] DOB: Sep 16, 1935 DOA: 11/11/2018 PCP: [name], MD   Brief Narrative:  Patient is 83 year old female with history of chronic hypoxic respiratory failure on ventilator through trach collar, PEG, coronary artery  disease, heart failure with reduced ejection fraction of 45 to 50%, proximal A. fib, adnexal mass, COPD, CVA with chronic encephalopathy, seizure disorder, DVT, GERD, hypertension, recurrent UTI who presented from [facility] with acute on chronic encephalopathy and hypotension.  Patient's hospital course was remarkable for septic shock secondary to gram-negative bacteremia, source being urine.  She had to be put on pressure support for septic shock.  Currently off pressors and hemodynamically stable.  ID was following.  Plan is to complete IV antibiotic course before discharge to [facility].   Assessment & Plan:   Principal Problem:   Bacteremia due to Klebsiella pneumoniae Active Problems:   Chronic respiratory failure (HCC)   Sepsis ([place])   UTI (urinary tract infection)   Septic shock: Secondary to gram-negative bacteremia.  Blood cultures for ESBL Klebsiella pneumoniae.  Most likely source is urine. Currently on avibactam.  Plan is to continue antibiotics through 11/20/2018.  ID was following. Currently off pressors.  BP stable. Tracheal aspirate showed  ACINETOBACTER CALCOACETICUS/BAUMANNII COMPLEX  And  STENOTROPHOMONAS MALTOPHILIA .  Most likely colonization.  Patient does not have any worsening of her respiratory status.  Will not change antibiotics.  Discussed with Dr. Megan Salon about this.  Urinary tract infection: Urine culture showed Proteus mirabilis, Klebsiella pneumonia.  Continue current antibiotics.  Heart failure with reduced ejection fraction: Currently looks euvolemic.  Last echocardiogram showed ejection fraction of 45 to 50%.  Paroxysmal A. fib: Currently in sinus tachycardia.  Not on anticoagulation.  Will  start on low-dose metoprolol if she can tolerate  Chronic respiratory failure: Vent dependent, on trach.  PCCM following  Hypernatremia: We will start on D5W and free water.  Check BMP tomorrow.  Hypokalemia :Supplemented and corrected  Hypophosphatemia: Started on supplementation  Elevated liver enzymes: Thought to be secondary to shock liver. Continue to monitor.  History of seizure disorder: On IV antiepileptic drugs.  Currently on Dilantin, Keppra, valproic acid  History of coronary disease: On aspirin  COPD: Currently stable.  Continue bronchodilators as needed.  Normocytic anemia: Due to her chronic multiple comorbidities.  Continue to monitor.  No need of transfusion for now.  Chronic debility: On chronic ventilation.  PCCM also following.  Status post PEG on tube feeding. Multiple discussion has been held with the family without success.  Palliative care was also involved.  Patient remains full code with full aggressive treatment.  Pressure ulcers: She has pressure ulcers on the lumbar region, sacral region, bilateral lower extremities.  Continue supportive care.   Nutrition Problem: Increased nutrient needs Etiology: acute illness, chronic illness, wound healing      DVT prophylaxis: Lovenox Code Status: Full Family Communication: Talked to the daughter on phone for update Disposition Plan: Discharge back to [facility] after completion of antibiotic therapy.   Consultants: PCCM, ID  Procedures: None  Antimicrobials:  Anti-infectives (From admission, onward)   Start     Dose/Rate Route Frequency Ordered Stop   11/13/18 2200  vancomycin (VANCOCIN) 1,250 mg in sodium chloride 0.9 % 250 mL IVPB  Status:  Discontinued     1,250 mg 166.7 mL/hr over 90 Minutes Intravenous Every 48 hours 11/12/18 0144 11/12/18 1028   11/13/18 1800  meropenem (MERREM) 1 g in sodium chloride 0.9 % 100 mL  IVPB  Status:  Discontinued     1 g 200 mL/hr over 30 Minutes Intravenous Every 24  hours 11/12/18 1021 11/12/18 1950   11/13/18 1800  ceftazidime-avibactam (AVYCAZ) 1.25 g in dextrose 5 % 50 mL IVPB     1.25 g 25 mL/hr over 2 Hours Intravenous Every 8 hours 11/13/18 1239 11/20/18 2359   11/12/18 2030  ceftazidime-avibactam (AVYCAZ) 0.94 g in dextrose 5 % 50 mL IVPB  Status:  Discontinued     0.94 g 25 mL/hr over 2 Hours Intravenous Every 12 hours 11/12/18 1950 11/13/18 1239   11/12/18 1200  vancomycin (VANCOCIN) 50 mg/mL oral solution 500 mg  Status:  Discontinued     500 mg Oral Every 6 hours 11/12/18 0848 11/12/18 0909   11/12/18 1028  vancomycin variable dose per unstable renal function (pharmacist dosing)  Status:  Discontinued      Does not apply See admin instructions 11/12/18 1028 11/12/18 1950   11/12/18 1000  meropenem (MERREM) 1 g in sodium chloride 0.9 % 100 mL IVPB  Status:  Discontinued     1 g 200 mL/hr over 30 Minutes Intravenous Every 12 hours 11/12/18 0144 11/12/18 1021   11/12/18 0900  metroNIDAZOLE (FLAGYL) IVPB 500 mg  Status:  Discontinued     500 mg 100 mL/hr over 60 Minutes Intravenous Every 8 hours 11/12/18 0848 11/12/18 0909   11/11/18 2315  meropenem (MERREM) 2 g in sodium chloride 0.9 % 100 mL IVPB     2 g 200 mL/hr over 30 Minutes Intravenous  Once 11/11/18 2307 11/12/18 0059   11/11/18 2315  vancomycin (VANCOCIN) IVPB 1000 mg/200 mL premix     1,000 mg 200 mL/hr over 60 Minutes Intravenous  Once 11/11/18 2308 11/12/18 0059      Subjective:  Patient seen and examined the bedside this morning.  Blood pressure stable.  In sinus tachycardia.She  looks comfortable, not in distress.  No overnight changes.  Objective: Vitals:   11/16/18 1100 11/16/18 1101 11/16/18 1136 11/16/18 1200  BP: 137/65 133/69  (!) 116/57  Pulse: (!) 111 (!) 117  (!) 115  Resp: (!) 24 (!) 27  (!) 23  Temp:   (!) 97.1 F (36.2 C)   TempSrc:   Axillary   SpO2: 100% 100%  100%  Weight:      Height:        Intake/Output Summary (Last 24 hours) at 11/16/2018 1305  Last data filed at 11/16/2018 1200 Gross per 24 hour  Intake 1787.11 ml  Output 2225 ml  Net -437.89 ml   Filed Weights   11/14/18 0500 11/15/18 0500 11/16/18 0500  Weight: 63.4 kg 67.7 kg 68 kg    Examination:  General exam: Chronically ill looking, severely debilitated, unresponsive HEENT: Vent via trach Respiratory system: Bilateral equal air entry, normal vesicular breath sounds, no wheezes or crackles  Cardiovascular system: Sinus tachycardia, no JVD, murmurs, rubs, gallops or clicks. Gastrointestinal system: Abdomen is nondistended, tense(chronic) nontender. No organomegaly or masses felt. Normal bowel sounds heard.PEG Central nervous system: Not Alert and oriented.  Extremities: No edema, no clubbing ,no cyanosis Skin: pressure ulcers on the lumbar region, sacral region, bilateral lower extremities   Data Reviewed: I have personally reviewed following labs and imaging studies  CBC: Recent Labs  Lab 11/11/18 2236 11/11/18 2245 11/12/18 0254 11/12/18 1614 11/13/18 0805 11/15/18 0536  WBC 45.6*  --  23.0* 20.3* 14.3* 7.8  NEUTROABS 41.7*  --   --   --  12.2*  6.2  HGB 11.7* 12.9 12.5 10.1* 9.3* 9.2*  HCT 39.3 38.0 41.5 33.1* 30.0* 31.4*  MCV 96.6  --  95.4 93.0 92.3 96.6  PLT 387  --  338 298 264 256   Basic Metabolic Panel: Recent Labs  Lab 11/12/18 0254 11/12/18 1614 11/13/18 0805 11/13/18 1624 11/14/18 0556 11/14/18 1629 11/15/18 0536 11/16/18 0459  NA 132* 135 140  --  143  --  154* 160*  K 5.4* 5.1 3.8  --  2.9*  --  3.0* 4.1  CL 97* 105 105  --  108  --  126* >130*  CO2 16* 14* 19*  --  18*  --  16* 18*  GLUCOSE 101* 165* 139*  --  320*  --  192* 147*  BUN 57* 59* 67*  --  73*  --  80* 81*  CREATININE 1.49* 1.55* 1.29*  --  1.31*  --  0.95 0.65  CALCIUM 10.2 8.7* 8.7*  --  8.8*  --  8.7* 9.2  MG 2.4  --  2.2 2.4 2.6* 2.5*  --   --   PHOS 3.3  --  3.4 3.2 2.5 1.8*  --  <1.0*   GFR: Estimated Creatinine Clearance: 53.6 mL/min (by C-G formula based  on SCr of 0.65 mg/dL). Liver Function Tests: Recent Labs  Lab 11/11/18 2236 11/12/18 0254 11/12/18 1614 11/13/18 0805 11/15/18 0536  AST 216* 558* 382* 298* 332*  ALT 163* 433* 340* 350* 646*  ALKPHOS 180* 180* 110 103 120  BILITOT 1.5* 1.9* 1.4* 1.6* 1.0  PROT 6.2* 6.7 5.3* 5.8* 5.8*  ALBUMIN 2.0* 2.2* 1.6* 1.7* 1.8*   No results for input(s): LIPASE, AMYLASE in the last 168 hours. No results for input(s): AMMONIA in the last 168 hours. Coagulation Profile: Recent Labs  Lab 11/12/18 0254  INR 1.7*   Cardiac Enzymes: No results for input(s): CKTOTAL, CKMB, CKMBINDEX, TROPONINI in the last 168 hours. BNP (last 3 results) No results for input(s): PROBNP in the last 8760 hours. HbA1C: No results for input(s): HGBA1C in the last 72 hours. CBG: Recent Labs  Lab 11/15/18 1921 11/15/18 2321 11/16/18 0342 11/16/18 0723 11/16/18 1056  GLUCAP 172* 143* 152* 142* 151*   Lipid Profile: No results for input(s): CHOL, HDL, LDLCALC, TRIG, CHOLHDL, LDLDIRECT in the last 72 hours. Thyroid Function Tests: No results for input(s): TSH, T4TOTAL, FREET4, T3FREE, THYROIDAB in the last 72 hours. Anemia Panel: No results for input(s): VITAMINB12, FOLATE, FERRITIN, TIBC, IRON, RETICCTPCT in the last 72 hours. Sepsis Labs: Recent Labs  Lab 11/12/18 0641 11/12/18 0959 11/12/18 1407 11/13/18 0805  LATICACIDVEN 9.1* 4.2* 4.5* 3.1*    Recent Results (from the past 240 hour(s))  Carbapenem Resistance Panel     Status: Abnormal   Collection Time: 11/11/18  5:18 PM  Result Value Ref Range Status   Carba Resistance IMP Gene NOT DETECTED NOT DETECTED Final   Carba Resistance VIM Gene NOT DETECTED NOT DETECTED Final   Carba Resistance NDM Gene NOT DETECTED NOT DETECTED Final   Carba Resistance KPC Gene DETECTED (A) NOT DETECTED Final   Carba Resistance OXA48 Gene NOT DETECTED NOT DETECTED Final    Comment: (NOTE) Cepheid Carba-R is an FDA-cleared nucleic acid amplification test   (NAAT)for the detection and differentiation of genes encoding the  most prevalent carbapenemases in bacterial isolate samples. Carbapenemase gene identification and implementation of comprehensive  infection control measures are recommended by the CDC to prevent the  spread of the resistant organisms. Performed  at [facility], [place] [address] [place], [place] [address]   SARS Coronavirus 2 (CEPHEID- Performed in [place] hospital lab), Hosp Order     Status: None   Collection Time: 11/11/18 10:04 PM   Specimen: Nasopharyngeal Swab  Result Value Ref Range Status   SARS Coronavirus 2 NEGATIVE NEGATIVE Final    Comment: (NOTE) If result is NEGATIVE SARS-CoV-2 target nucleic acids are NOT DETECTED. The SARS-CoV-2 RNA is generally detectable in upper and lower  respiratory specimens during the acute phase of infection. The lowest  concentration of SARS-CoV-2 viral copies this assay can detect is 250  copies / mL. A negative result does not preclude SARS-CoV-2 infection  and should not be used as the sole basis for treatment or other  patient management decisions.  A negative result may occur with  improper specimen collection / handling, submission of specimen other  than nasopharyngeal swab, presence of viral mutation(s) within the  areas targeted by this assay, and inadequate number of viral copies  (<250 copies / mL). A negative result must be combined with clinical  observations, patient history, and epidemiological information. If result is POSITIVE SARS-CoV-2 target nucleic acids are DETECTED. The SARS-CoV-2 RNA is generally detectable in upper and lower  respiratory specimens dur ing the acute phase of infection.  Positive  results are indicative of active infection with SARS-CoV-2.  Clinical  correlation with patient history and other diagnostic information is  necessary to determine patient infection status.  Positive results do  not rule out bacterial infection or  co-infection with other viruses. If result is PRESUMPTIVE POSTIVE SARS-CoV-2 nucleic acids MAY BE PRESENT.   A presumptive positive result was obtained on the submitted specimen  and confirmed on repeat testing.  While 2019 novel coronavirus  (SARS-CoV-2) nucleic acids may be present in the submitted sample  additional confirmatory testing may be necessary for epidemiological  and / or clinical management purposes  to differentiate between  SARS-CoV-2 and other Sarbecovirus currently known to infect humans.  If clinically indicated additional testing with an alternate test  methodology [phone]) is advised. The SARS-CoV-2 RNA is generally  detectable in upper and lower respiratory sp ecimens during the acute  phase of infection. The expected result is Negative. Fact Sheet for Patients:  StrictlyIdeas.no Fact Sheet for Healthcare Providers: BankingDealers.co.za This test is not yet approved or cleared by the [place] FDA and has been authorized for detection and/or diagnosis of SARS-CoV-2 by FDA under an Emergency Use Authorization (EUA).  This EUA will remain in effect (meaning this test can be used) for the duration of the COVID-19 declaration under Section 564(b)(1) of the Act, 21 U.S.C. section 360bbb-3(b)(1), unless the authorization is terminated or revoked sooner. Performed at [facility], [place] [address], [place], [place] [address]   Urine culture     Status: Abnormal   Collection Time: 11/11/18 10:17 PM   Specimen: Urine, Catheterized  Result Value Ref Range Status   Specimen Description URINE, CATHETERIZED  Final   Special Requests NONE  Final   Culture (A)  Final    >=100,000 COLONIES/mL PROTEUS MIRABILIS 70,000 COLONIES/mL KLEBSIELLA PNEUMONIAE PREVIOUS CRC POSITIVE IN BLOOD CULTURE Performed at [facility], [place] [address], [place], [place] [address]    Report Status 11/15/2018 FINAL  Final   Organism  ID, Bacteria PROTEUS MIRABILIS (A)  Final   Organism ID, Bacteria KLEBSIELLA PNEUMONIAE (A)  Final      Susceptibility   Proteus  mirabilis - MIC*    AMPICILLIN >=32 RESISTANT Resistant     CEFAZOLIN >=64 RESISTANT Resistant     CEFTRIAXONE >=64 RESISTANT Resistant     CIPROFLOXACIN >=4 RESISTANT Resistant     GENTAMICIN <=1 SENSITIVE Sensitive     IMIPENEM 2 SENSITIVE Sensitive     NITROFURANTOIN 128 RESISTANT Resistant     TRIMETH/SULFA <=20 SENSITIVE Sensitive     AMPICILLIN/SULBACTAM 8 SENSITIVE Sensitive     PIP/TAZO <=4 SENSITIVE Sensitive     * >=100,000 COLONIES/mL PROTEUS MIRABILIS  Blood Culture (routine x 2)     Status: Abnormal   Collection Time: 11/11/18 10:37 PM   Specimen: BLOOD RIGHT ARM  Result Value Ref Range Status   Specimen Description BLOOD RIGHT ARM  Final   Special Requests   Final    BOTTLES DRAWN AEROBIC AND ANAEROBIC Blood Culture results may not be optimal due to an excessive volume of blood received in culture bottles   Culture  Setup Time   Final    GRAM NEGATIVE RODS AEROBIC BOTTLE ONLY CRITICAL RESULT CALLED TO, READ BACK BY AND VERIFIED WITH: Konrad Saha AT 2641 11/12/2018 BY L BENFIELD Performed at [facility], [address], [place], [place] [address]    Culture (A)  Final    KLEBSIELLA PNEUMONIAE CONFIRMED CARBAPENEMASE RESISTANT ENTEROBACTERIACAE    Report Status 11/14/2018 FINAL  Final   Organism ID, Bacteria KLEBSIELLA PNEUMONIAE  Final      Susceptibility   Klebsiella pneumoniae - MIC*    AMPICILLIN >=32 RESISTANT Resistant     CEFAZOLIN >=64 RESISTANT Resistant     CEFEPIME 16 INTERMEDIATE Intermediate     CEFTAZIDIME >=64 RESISTANT Resistant     CEFTRIAXONE >=64 RESISTANT Resistant     CIPROFLOXACIN >=4 RESISTANT Resistant     GENTAMICIN 8 INTERMEDIATE Intermediate     IMIPENEM 8 INTERMEDIATE Intermediate     TRIMETH/SULFA >=320 RESISTANT Resistant     AMPICILLIN/SULBACTAM >=32 RESISTANT Resistant     PIP/TAZO >=128  RESISTANT Resistant     Extended ESBL NEGATIVE Sensitive     * KLEBSIELLA PNEUMONIAE  Blood Culture ID Panel (Reflexed)     Status: Abnormal   Collection Time: 11/11/18 10:37 PM  Result Value Ref Range Status   Enterococcus species NOT DETECTED NOT DETECTED Final   Listeria monocytogenes NOT DETECTED NOT DETECTED Final   Staphylococcus species NOT DETECTED NOT DETECTED Final   Staphylococcus aureus (BCID) NOT DETECTED NOT DETECTED Final   Streptococcus species NOT DETECTED NOT DETECTED Final   Streptococcus agalactiae NOT DETECTED NOT DETECTED Final   Streptococcus pneumoniae NOT DETECTED NOT DETECTED Final   Streptococcus pyogenes NOT DETECTED NOT DETECTED Final   Acinetobacter baumannii NOT DETECTED NOT DETECTED Final   Enterobacteriaceae species DETECTED (A) NOT DETECTED Final    Comment: Enterobacteriaceae represent a large family of gram-negative bacteria, not a single organism. CRITICAL RESULT CALLED TO, READ BACK BY AND VERIFIED WITH: M BITONTI,PHARMD AT 4076 11/12/2018 BY L BENFIELD    Enterobacter cloacae complex NOT DETECTED NOT DETECTED Final   Escherichia coli NOT DETECTED NOT DETECTED Final   Klebsiella oxytoca NOT DETECTED NOT DETECTED Final   Klebsiella pneumoniae DETECTED (A) NOT DETECTED Final    Comment: CRITICAL RESULT CALLED TO, READ BACK BY AND VERIFIED WITH: M BITONTI,PHARMD AT 1938 11/12/2018 BY L BENFIELD    Proteus species NOT DETECTED NOT DETECTED Final   Serratia marcescens NOT DETECTED NOT DETECTED Final   Carbapenem resistance DETECTED (A) NOT DETECTED Final  Comment: CRITICAL RESULT CALLED TO, READ BACK BY AND VERIFIED WITH: M BITONTI,PHARMD AT 1938 11/12/2018 BY L BENFIELD    Haemophilus influenzae NOT DETECTED NOT DETECTED Final   Neisseria meningitidis NOT DETECTED NOT DETECTED Final   Pseudomonas aeruginosa NOT DETECTED NOT DETECTED Final   Candida albicans NOT DETECTED NOT DETECTED Final   Candida glabrata NOT DETECTED NOT DETECTED Final   Candida  krusei NOT DETECTED NOT DETECTED Final   Candida parapsilosis NOT DETECTED NOT DETECTED Final   Candida tropicalis NOT DETECTED NOT DETECTED Final    Comment: Performed at [facility], [place] [address], [place], [place] [address]  Blood Culture (routine x 2)     Status: None   Collection Time: 11/11/18 10:39 PM   Specimen: BLOOD RIGHT HAND  Result Value Ref Range Status   Specimen Description BLOOD RIGHT HAND  Final   Special Requests   Final    BOTTLES DRAWN AEROBIC ONLY Blood Culture adequate volume   Culture   Final    NO GROWTH 5 DAYS Performed at [facility], [place] [address], [place], [place] [address]    Report Status 11/16/2018 FINAL  Final  C difficile quick scan w PCR reflex     Status: None   Collection Time: 11/12/18  2:09 AM   Specimen: STOOL  Result Value Ref Range Status   C Diff antigen NEGATIVE NEGATIVE Final   C Diff toxin NEGATIVE NEGATIVE Final   C Diff interpretation No C. difficile detected.  Final    Comment: Performed at [facility], [place] [address] [place], [place] [address]  Culture, respiratory (tracheal aspirate)     Status: None   Collection Time: 11/12/18  3:47 AM   Specimen: Tracheal Aspirate; Respiratory  Result Value Ref Range Status   Specimen Description TRACHEAL ASPIRATE  Final   Special Requests NONE  Final   Gram Stain   Final    FEW WBC PRESENT, PREDOMINANTLY PMN MODERATE GRAM NEGATIVE RODS FEW GRAM POSITIVE COCCI IN PAIRS RARE GRAM POSITIVE RODS    Culture   Final    MODERATE ACINETOBACTER CALCOACETICUS/BAUMANNII COMPLEX MODERATE STENOTROPHOMONAS MALTOPHILIA MULTI-DRUG RESISTANT ORGANISM CRITICAL RESULT CALLED TO, READ BACK BY AND VERIFIED WITH: PHARMD [place] [id number] AT 945 AM BY CM Performed at [facility], [place] [address] [place], [place] [address]    Report Status 11/16/2018 FINAL  Final   Organism ID, Bacteria STENOTROPHOMONAS MALTOPHILIA  Final   Organism ID, Bacteria ACINETOBACTER  CALCOACETICUS/BAUMANNII COMPLEX  Final      Susceptibility   Acinetobacter calcoaceticus/baumannii complex - MIC*    CEFTAZIDIME >=64 RESISTANT Resistant     CEFTRIAXONE >=64 RESISTANT Resistant     CIPROFLOXACIN >=4 RESISTANT Resistant     GENTAMICIN 8 INTERMEDIATE Intermediate     IMIPENEM >=16 RESISTANT Resistant     PIP/TAZO >=128 RESISTANT Resistant     TRIMETH/SULFA 160 RESISTANT Resistant     CEFEPIME >=64 RESISTANT Resistant     AMPICILLIN/SULBACTAM 4 SENSITIVE Sensitive     * MODERATE ACINETOBACTER CALCOACETICUS/BAUMANNII COMPLEX   Stenotrophomonas maltophilia - MIC*    LEVOFLOXACIN 1 SENSITIVE Sensitive     TRIMETH/SULFA 40 SENSITIVE Sensitive     * MODERATE STENOTROPHOMONAS MALTOPHILIA  MRSA PCR Screening     Status: None   Collection Time: 11/14/18  9:19 AM   Specimen: Nasal Mucosa; Nasopharyngeal  Result Value Ref Range Status   MRSA by PCR NEGATIVE NEGATIVE Final    Comment:  The GeneXpert MRSA Assay (FDA approved for NASAL specimens only), is one component of a comprehensive MRSA colonization surveillance program. It is not intended to diagnose MRSA infection nor to guide or monitor treatment for MRSA infections. Performed at [facility], [place] [address] [place], [place] [address]          Radiology Studies: No results found.      Scheduled Meds: . aspirin  81 mg Per Tube Daily  . chlorhexidine gluconate (MEDLINE KIT)  15 mL Mouth Rinse BID  . Chlorhexidine Gluconate Cloth  6 each Topical Daily  . enoxaparin (LOVENOX) injection  40 mg Subcutaneous Q24H  . free water  300 mL Per Tube Q8H  . insulin aspart  0-9 Units Subcutaneous Q4H  . mouth rinse  15 mL Mouth Rinse 10 times per day  . midodrine  5 mg Per Tube TID WC  . multivitamin  15 mL Per Tube Daily  . nutrition supplement (JUVEN)  1 packet Oral BID  . pantoprazole sodium  40 mg Per Tube Daily  . phenytoin  100 mg Intravenous BID  . phosphorus  500 mg Oral BID  . zinc  sulfate  220 mg Per Tube Daily   Continuous Infusions: . ceftazidime avibactam (AVYCAZ) IVPB Stopped (11/16/18 1114)  . dextrose Stopped (11/16/18 0914)  . feeding supplement (PIVOT 1.5 CAL) 1,000 mL (11/15/18 0343)  . levETIRAcetam 500 mg (11/16/18 0521)  . norepinephrine (LEVOPHED) Adult infusion Stopped (11/14/18 0057)  . phenytoin (DILANTIN) IV Stopped (11/15/18 2218)  . sodium phosphate  Dextrose 5% IVPB 43 mL/hr at 11/16/18 1200  . valproate sodium 51.3 mL/hr at 11/16/18 1200     LOS: 4 days    Time spent: 35 mins.      Shelly Coss, MD [facility] Pager [phone]  If 7PM-7AM, please contact night-coverage [email or web address] Password [email or web address] 11/16/2018, 1:05 PM

## 2018-11-17 DIAGNOSIS — J9611 Chronic respiratory failure with hypoxia: Secondary | ICD-10-CM

## 2018-11-17 DIAGNOSIS — N3000 Acute cystitis without hematuria: Secondary | ICD-10-CM

## 2018-11-17 DIAGNOSIS — D638 Anemia in other chronic diseases classified elsewhere: Secondary | ICD-10-CM

## 2018-11-17 DIAGNOSIS — E870 Hyperosmolality and hypernatremia: Secondary | ICD-10-CM

## 2018-11-17 DIAGNOSIS — E876 Hypokalemia: Secondary | ICD-10-CM

## 2018-11-17 LAB — PHOSPHORUS: Phosphorus: 2.5 mg/dL (ref 2.5–4.6)

## 2018-11-17 LAB — BASIC METABOLIC PANEL
Anion gap: 10 (ref 5–15)
BUN: 79 mg/dL — ABNORMAL HIGH (ref 8–23)
CO2: 19 mmol/L — ABNORMAL LOW (ref 22–32)
Calcium: 8.3 mg/dL — ABNORMAL LOW (ref 8.9–10.3)
Chloride: 129 mmol/L — ABNORMAL HIGH (ref 98–111)
Creatinine, Ser: 0.68 mg/dL (ref 0.44–1.00)
GFR calc Af Amer: >60 mL/min (ref >60)
GFR calc non Af Amer: >60 mL/min (ref >60)
Glucose, Bld: 127 mg/dL — ABNORMAL HIGH (ref 70–99)
Potassium: 2.9 mmol/L — ABNORMAL LOW (ref 3.5–5.1)
Sodium: 158 mmol/L — ABNORMAL HIGH (ref 135–145)

## 2018-11-17 LAB — CBC
HCT: 30.7 % — ABNORMAL LOW (ref 36.0–46.0)
Hemoglobin: 8.6 g/dL — ABNORMAL LOW (ref 12.0–15.0)
MCH: 28.2 pg (ref 26.0–34.0)
MCHC: 28 g/dL — ABNORMAL LOW (ref 30.0–36.0)
MCV: 100.7 fL — ABNORMAL HIGH (ref 80.0–100.0)
Platelets: 232 10*3/uL (ref 150–400)
RBC: 3.05 MIL/uL — ABNORMAL LOW (ref 3.87–5.11)
RDW: 17.8 % — ABNORMAL HIGH (ref 11.5–15.5)
WBC: 9.6 10*3/uL (ref 4.0–10.5)
nRBC: 0.2 % (ref 0.0–0.2)

## 2018-11-17 LAB — GLUCOSE, CAPILLARY
Glucose-Capillary: 117 mg/dL — ABNORMAL HIGH (ref 70–99)
Glucose-Capillary: 121 mg/dL — ABNORMAL HIGH (ref 70–99)
Glucose-Capillary: 125 mg/dL — ABNORMAL HIGH (ref 70–99)
Glucose-Capillary: 125 mg/dL — ABNORMAL HIGH (ref 70–99)
Glucose-Capillary: 126 mg/dL — ABNORMAL HIGH (ref 70–99)
Glucose-Capillary: 130 mg/dL — ABNORMAL HIGH (ref 70–99)

## 2018-11-17 LAB — ALBUMIN: Albumin: 1.8 g/dL — ABNORMAL LOW (ref 3.5–5.0)

## 2018-11-17 LAB — PHENYTOIN LEVEL, TOTAL: Phenytoin Lvl: 4.7 ug/mL — ABNORMAL LOW (ref 10.0–20.0)

## 2018-11-17 MED ORDER — SODIUM CHLORIDE 0.9 % IV SOLN
INTRAVENOUS | Status: DC | PRN
  Administered 2018-11-17 – 2018-11-23 (×2): 500 mL via INTRAVENOUS
  Administered 2018-11-26 – 2018-11-27 (×2): 250 mL via INTRAVENOUS

## 2018-11-17 MED ORDER — SODIUM CHLORIDE 0.9 % IV SOLN
130.0000 mg | Freq: Three times a day (TID) | INTRAVENOUS | Status: AC
  Administered 2018-11-17 – 2018-11-22 (×15): 130 mg via INTRAVENOUS
  Filled 2018-11-17 (×24): qty 2.6

## 2018-11-17 NOTE — Progress Notes (Signed)
PROGRESS NOTE    Melissa Cochran  BJS:[id number]  DOB: 1935-09-11  DOA: 11/11/2018 PCP: [name], MD  Brief Narrative:   83 year old female with history of chronic hypoxic respiratory failure on ventilator through trach collar, PEG, coronary artery  disease, heart failure with reduced ejection fraction of 45 to 50%, proximal A. fib, adnexal mass, COPD, CVA with chronic encephalopathy, seizure disorder, DVT, GERD, hypertension, recurrent UTI who presented from [facility] with acute on chronic encephalopathy and hypotension.  Patient's hospital course was remarkable for septic shock secondary to gram-negative bacteremia, source being urine.  She had to be put on pressure support for septic shock.  Currently off pressors and hemodynamically stable.  ID was following.  Plan is to complete IV antibiotic course before discharge to [facility]   Subjective:  Patient non responsive, non verbal. On trach,PEG,  external urinary catheter/rectal pouch  Objective: Vitals:   11/17/18 1200 11/17/18 1300 11/17/18 1400 11/17/18 1508  BP: (!) 126/53 124/63 (!) 109/56 109/64  Pulse: (!) 118 (!) 115 (!) 118   Resp: (!) 28 (!) 32 (!) 29   Temp:      TempSrc:      SpO2: 99% 99% 98% 99%  Weight:      Height:        Intake/Output Summary (Last 24 hours) at 11/17/2018 1540 Last data filed at 11/17/2018 1328 Gross per 24 hour  Intake 5869.97 ml  Output 850 ml  Net 5019.97 ml   Filed Weights   11/15/18 0500 11/16/18 0500 11/17/18 0240  Weight: 67.7 kg 68 kg 71.3 kg    Physical Examination:  General exam: Unresponsive, non verbal, s/p trach  Respiratory system: Clear to auscultation. Respiratory effort normal. Cardiovascular system: S1 & S2 heard, RRR. No JVD, murmurs, rubs, gallops or clicks. No pedal edema. Gastrointestinal system: Abdomen is nondistended, soft and nontender. S/p PEG Central nervous system: Alert and oriented. No focal neurological deficits. Skin: No rashes, has pressure  ulcers on the lumbar region, sacral region, bilateral lower extremities. Psychiatry: . Mood & affect  Cannot be assessed.     Data Reviewed: I have personally reviewed following labs and imaging studies  CBC: Recent Labs  Lab 11/11/18 2236  11/12/18 0254 11/12/18 1614 11/13/18 0805 11/15/18 0536 11/17/18 0402  WBC 45.6*  --  23.0* 20.3* 14.3* 7.8 9.6  NEUTROABS 41.7*  --   --   --  12.2* 6.2  --   HGB 11.7*   < > 12.5 10.1* 9.3* 9.2* 8.6*  HCT 39.3   < > 41.5 33.1* 30.0* 31.4* 30.7*  MCV 96.6  --  95.4 93.0 92.3 96.6 100.7*  PLT 387  --  338 298 264 180 232   < > = values in this interval not displayed.   Basic Metabolic Panel: Recent Labs  Lab 11/12/18 0254  11/13/18 0805 11/13/18 1624 11/14/18 0556 11/14/18 1629 11/15/18 0536 11/16/18 0459 11/17/18 0402  NA 132*   < > 140  --  143  --  154* 160* 158*  K 5.4*   < > 3.8  --  2.9*  --  3.0* 4.1 2.9*  CL 97*   < > 105  --  108  --  126* >130* 129*  CO2 16*   < > 19*  --  18*  --  16* 18* 19*  GLUCOSE 101*   < > 139*  --  320*  --  192* 147* 127*  BUN 57*   < > 67*  --  73*  --  80* 81* 79*  CREATININE 1.49*   < > 1.29*  --  1.31*  --  0.95 0.65 0.68  CALCIUM 10.2   < > 8.7*  --  8.8*  --  8.7* 9.2 8.3*  MG 2.4  --  2.2 2.4 2.6* 2.5*  --   --   --   PHOS 3.3  --  3.4 3.2 2.5 1.8*  --  <1.0* 2.5   < > = values in this interval not displayed.   GFR: Estimated Creatinine Clearance: 53.6 mL/min (by C-G formula based on SCr of 0.68 mg/dL). Liver Function Tests: Recent Labs  Lab 11/12/18 0254 11/12/18 1614 11/13/18 0805 11/15/18 0536 11/16/18 1440 11/17/18 0402  AST 558* 382* 298* 332* 118*  --   ALT 433* 340* 350* 646* 424*  --   ALKPHOS 180* 110 103 120 144*  --   BILITOT 1.9* 1.4* 1.6* 1.0 1.2  --   PROT 6.7 5.3* 5.8* 5.8* 5.7*  --   ALBUMIN 2.2* 1.6* 1.7* 1.8* 1.8* 1.8*   No results for input(s): LIPASE, AMYLASE in the last 168 hours. No results for input(s): AMMONIA in the last 168 hours. Coagulation  Profile: Recent Labs  Lab 11/12/18 0254  INR 1.7*   Cardiac Enzymes: No results for input(s): CKTOTAL, CKMB, CKMBINDEX, TROPONINI in the last 168 hours. BNP (last 3 results) No results for input(s): PROBNP in the last 8760 hours. HbA1C: No results for input(s): HGBA1C in the last 72 hours. CBG: Recent Labs  Lab 11/16/18 1922 11/16/18 2309 11/17/18 0335 11/17/18 0905 11/17/18 1200  GLUCAP 152* 162* 125* 126* 121*   Lipid Profile: No results for input(s): CHOL, HDL, LDLCALC, TRIG, CHOLHDL, LDLDIRECT in the last 72 hours. Thyroid Function Tests: No results for input(s): TSH, T4TOTAL, FREET4, T3FREE, THYROIDAB in the last 72 hours. Anemia Panel: No results for input(s): VITAMINB12, FOLATE, FERRITIN, TIBC, IRON, RETICCTPCT in the last 72 hours. Sepsis Labs: Recent Labs  Lab 11/12/18 0641 11/12/18 0959 11/12/18 1407 11/13/18 0805  LATICACIDVEN 9.1* 4.2* 4.5* 3.1*    Recent Results (from the past 240 hour(s))  Carbapenem Resistance Panel     Status: Abnormal   Collection Time: 11/11/18  5:18 PM  Result Value Ref Range Status   Carba Resistance IMP Gene NOT DETECTED NOT DETECTED Final   Carba Resistance VIM Gene NOT DETECTED NOT DETECTED Final   Carba Resistance NDM Gene NOT DETECTED NOT DETECTED Final   Carba Resistance KPC Gene DETECTED (A) NOT DETECTED Final   Carba Resistance OXA48 Gene NOT DETECTED NOT DETECTED Final    Comment: (NOTE) Cepheid Carba-R is an FDA-cleared nucleic acid amplification test  (NAAT)for the detection and differentiation of genes encoding the  most prevalent carbapenemases in bacterial isolate samples. Carbapenemase gene identification and implementation of comprehensive  infection control measures are recommended by the CDC to prevent the  spread of the resistant organisms. Performed at [facility], [place] [address] [place], [place] [address]   SARS Coronavirus 2 (CEPHEID- Performed in [place] hospital lab), Hosp Order      Status: None   Collection Time: 11/11/18 10:04 PM   Specimen: Nasopharyngeal Swab  Result Value Ref Range Status   SARS Coronavirus 2 NEGATIVE NEGATIVE Final    Comment: (NOTE) If result is NEGATIVE SARS-CoV-2 target nucleic acids are NOT DETECTED. The SARS-CoV-2 RNA is generally detectable in upper and lower  respiratory specimens during the acute phase of infection. The lowest  concentration of  SARS-CoV-2 viral copies this assay can detect is 250  copies / mL. A negative result does not preclude SARS-CoV-2 infection  and should not be used as the sole basis for treatment or other  patient management decisions.  A negative result may occur with  improper specimen collection / handling, submission of specimen other  than nasopharyngeal swab, presence of viral mutation(s) within the  areas targeted by this assay, and inadequate number of viral copies  (<250 copies / mL). A negative result must be combined with clinical  observations, patient history, and epidemiological information. If result is POSITIVE SARS-CoV-2 target nucleic acids are DETECTED. The SARS-CoV-2 RNA is generally detectable in upper and lower  respiratory specimens dur ing the acute phase of infection.  Positive  results are indicative of active infection with SARS-CoV-2.  Clinical  correlation with patient history and other diagnostic information is  necessary to determine patient infection status.  Positive results do  not rule out bacterial infection or co-infection with other viruses. If result is PRESUMPTIVE POSTIVE SARS-CoV-2 nucleic acids MAY BE PRESENT.   A presumptive positive result was obtained on the submitted specimen  and confirmed on repeat testing.  While 2019 novel coronavirus  (SARS-CoV-2) nucleic acids may be present in the submitted sample  additional confirmatory testing may be necessary for epidemiological  and / or clinical management purposes  to differentiate between  SARS-CoV-2 and other  Sarbecovirus currently known to infect humans.  If clinically indicated additional testing with an alternate test  methodology [phone]) is advised. The SARS-CoV-2 RNA is generally  detectable in upper and lower respiratory sp ecimens during the acute  phase of infection. The expected result is Negative. Fact Sheet for Patients:  StrictlyIdeas.no Fact Sheet for Healthcare Providers: BankingDealers.co.za This test is not yet approved or cleared by the [place] FDA and has been authorized for detection and/or diagnosis of SARS-CoV-2 by FDA under an Emergency Use Authorization (EUA).  This EUA will remain in effect (meaning this test can be used) for the duration of the COVID-19 declaration under Section 564(b)(1) of the Act, 21 U.S.C. section 360bbb-3(b)(1), unless the authorization is terminated or revoked sooner. Performed at [facility], [place] [address], [place], [place] [address]   Urine culture     Status: Abnormal   Collection Time: 11/11/18 10:17 PM   Specimen: Urine, Catheterized  Result Value Ref Range Status   Specimen Description URINE, CATHETERIZED  Final   Special Requests NONE  Final   Culture (A)  Final    >=100,000 COLONIES/mL PROTEUS MIRABILIS 70,000 COLONIES/mL KLEBSIELLA PNEUMONIAE PREVIOUS CRC POSITIVE IN BLOOD CULTURE Performed at [facility], [place] [address], [place], [place] [address]    Report Status 11/15/2018 FINAL  Final   Organism ID, Bacteria PROTEUS MIRABILIS (A)  Final   Organism ID, Bacteria KLEBSIELLA PNEUMONIAE (A)  Final      Susceptibility   Proteus mirabilis - MIC*    AMPICILLIN >=32 RESISTANT Resistant     CEFAZOLIN >=64 RESISTANT Resistant     CEFTRIAXONE >=64 RESISTANT Resistant     CIPROFLOXACIN >=4 RESISTANT Resistant     GENTAMICIN <=1 SENSITIVE Sensitive     IMIPENEM 2 SENSITIVE Sensitive     NITROFURANTOIN 128 RESISTANT Resistant     TRIMETH/SULFA <=20 SENSITIVE  Sensitive     AMPICILLIN/SULBACTAM 8 SENSITIVE Sensitive     PIP/TAZO <=4 SENSITIVE Sensitive     * >=100,000 COLONIES/mL PROTEUS MIRABILIS  Blood Culture (routine x 2)  Status: Abnormal   Collection Time: 11/11/18 10:37 PM   Specimen: BLOOD RIGHT ARM  Result Value Ref Range Status   Specimen Description BLOOD RIGHT ARM  Final   Special Requests   Final    BOTTLES DRAWN AEROBIC AND ANAEROBIC Blood Culture results may not be optimal due to an excessive volume of blood received in culture bottles   Culture  Setup Time   Final    GRAM NEGATIVE RODS AEROBIC BOTTLE ONLY CRITICAL RESULT CALLED TO, READ BACK BY AND VERIFIED WITH: Konrad Saha AT 3094 11/12/2018 BY L BENFIELD Performed at [facility], [place] [address], [place], [place] [address]    Culture (A)  Final    KLEBSIELLA PNEUMONIAE CONFIRMED CARBAPENEMASE RESISTANT ENTEROBACTERIACAE    Report Status 11/14/2018 FINAL  Final   Organism ID, Bacteria KLEBSIELLA PNEUMONIAE  Final      Susceptibility   Klebsiella pneumoniae - MIC*    AMPICILLIN >=32 RESISTANT Resistant     CEFAZOLIN >=64 RESISTANT Resistant     CEFEPIME 16 INTERMEDIATE Intermediate     CEFTAZIDIME >=64 RESISTANT Resistant     CEFTRIAXONE >=64 RESISTANT Resistant     CIPROFLOXACIN >=4 RESISTANT Resistant     GENTAMICIN 8 INTERMEDIATE Intermediate     IMIPENEM 8 INTERMEDIATE Intermediate     TRIMETH/SULFA >=320 RESISTANT Resistant     AMPICILLIN/SULBACTAM >=32 RESISTANT Resistant     PIP/TAZO >=128 RESISTANT Resistant     Extended ESBL NEGATIVE Sensitive     * KLEBSIELLA PNEUMONIAE  Blood Culture ID Panel (Reflexed)     Status: Abnormal   Collection Time: 11/11/18 10:37 PM  Result Value Ref Range Status   Enterococcus species NOT DETECTED NOT DETECTED Final   Listeria monocytogenes NOT DETECTED NOT DETECTED Final   Staphylococcus species NOT DETECTED NOT DETECTED Final   Staphylococcus aureus (BCID) NOT DETECTED NOT DETECTED Final   Streptococcus  species NOT DETECTED NOT DETECTED Final   Streptococcus agalactiae NOT DETECTED NOT DETECTED Final   Streptococcus pneumoniae NOT DETECTED NOT DETECTED Final   Streptococcus pyogenes NOT DETECTED NOT DETECTED Final   Acinetobacter baumannii NOT DETECTED NOT DETECTED Final   Enterobacteriaceae species DETECTED (A) NOT DETECTED Final    Comment: Enterobacteriaceae represent a large family of gram-negative bacteria, not a single organism. CRITICAL RESULT CALLED TO, READ BACK BY AND VERIFIED WITH: M BITONTI,PHARMD AT 8811 11/12/2018 BY L BENFIELD    Enterobacter cloacae complex NOT DETECTED NOT DETECTED Final   Escherichia coli NOT DETECTED NOT DETECTED Final   Klebsiella oxytoca NOT DETECTED NOT DETECTED Final   Klebsiella pneumoniae DETECTED (A) NOT DETECTED Final    Comment: CRITICAL RESULT CALLED TO, READ BACK BY AND VERIFIED WITH: M BITONTI,PHARMD AT 1938 11/12/2018 BY L BENFIELD    Proteus species NOT DETECTED NOT DETECTED Final   Serratia marcescens NOT DETECTED NOT DETECTED Final   Carbapenem resistance DETECTED (A) NOT DETECTED Final    Comment: CRITICAL RESULT CALLED TO, READ BACK BY AND VERIFIED WITH: M BITONTI,PHARMD AT 1938 11/12/2018 BY L BENFIELD    Haemophilus influenzae NOT DETECTED NOT DETECTED Final   Neisseria meningitidis NOT DETECTED NOT DETECTED Final   Pseudomonas aeruginosa NOT DETECTED NOT DETECTED Final   Candida albicans NOT DETECTED NOT DETECTED Final   Candida glabrata NOT DETECTED NOT DETECTED Final   Candida krusei NOT DETECTED NOT DETECTED Final   Candida parapsilosis NOT DETECTED NOT DETECTED Final   Candida tropicalis NOT DETECTED NOT DETECTED Final    Comment: Performed at  [facility], [place] [address] [place], [place] [address]  Blood Culture (routine x 2)     Status: None   Collection Time: 11/11/18 10:39 PM   Specimen: BLOOD RIGHT HAND  Result Value Ref Range Status   Specimen Description BLOOD RIGHT HAND  Final   Special Requests   Final     BOTTLES DRAWN AEROBIC ONLY Blood Culture adequate volume   Culture   Final    NO GROWTH 5 DAYS Performed at [facility], [place] [address] [place], [place] [address]    Report Status 11/16/2018 FINAL  Final  C difficile quick scan w PCR reflex     Status: None   Collection Time: 11/12/18  2:09 AM   Specimen: STOOL  Result Value Ref Range Status   C Diff antigen NEGATIVE NEGATIVE Final   C Diff toxin NEGATIVE NEGATIVE Final   C Diff interpretation No C. difficile detected.  Final    Comment: Performed at [facility], [place] [address], [place], [place] [address]  Culture, respiratory (tracheal aspirate)     Status: None   Collection Time: 11/12/18  3:47 AM   Specimen: Tracheal Aspirate; Respiratory  Result Value Ref Range Status   Specimen Description TRACHEAL ASPIRATE  Final   Special Requests NONE  Final   Gram Stain   Final    FEW WBC PRESENT, PREDOMINANTLY PMN MODERATE GRAM NEGATIVE RODS FEW GRAM POSITIVE COCCI IN PAIRS RARE GRAM POSITIVE RODS    Culture   Final    MODERATE ACINETOBACTER CALCOACETICUS/BAUMANNII COMPLEX MODERATE STENOTROPHOMONAS MALTOPHILIA MULTI-DRUG RESISTANT ORGANISM CRITICAL RESULT CALLED TO, READ BACK BY AND VERIFIED WITH: PHARMD [place] [id number] AT 945 AM BY CM Performed at [facility], [place] [address], [place], [place] [address]    Report Status 11/16/2018 FINAL  Final   Organism ID, Bacteria STENOTROPHOMONAS MALTOPHILIA  Final   Organism ID, Bacteria ACINETOBACTER CALCOACETICUS/BAUMANNII COMPLEX  Final      Susceptibility   Acinetobacter calcoaceticus/baumannii complex - MIC*    CEFTAZIDIME >=64 RESISTANT Resistant     CEFTRIAXONE >=64 RESISTANT Resistant     CIPROFLOXACIN >=4 RESISTANT Resistant     GENTAMICIN 8 INTERMEDIATE Intermediate     IMIPENEM >=16 RESISTANT Resistant     PIP/TAZO >=128 RESISTANT Resistant     TRIMETH/SULFA 160 RESISTANT Resistant     CEFEPIME >=64 RESISTANT Resistant     AMPICILLIN/SULBACTAM 4 SENSITIVE  Sensitive     * MODERATE ACINETOBACTER CALCOACETICUS/BAUMANNII COMPLEX   Stenotrophomonas maltophilia - MIC*    LEVOFLOXACIN 1 SENSITIVE Sensitive     TRIMETH/SULFA 40 SENSITIVE Sensitive     * MODERATE STENOTROPHOMONAS MALTOPHILIA  MRSA PCR Screening     Status: None   Collection Time: 11/14/18  9:19 AM   Specimen: Nasal Mucosa; Nasopharyngeal  Result Value Ref Range Status   MRSA by PCR NEGATIVE NEGATIVE Final    Comment:        The GeneXpert MRSA Assay (FDA approved for NASAL specimens only), is one component of a comprehensive MRSA colonization surveillance program. It is not intended to diagnose MRSA infection nor to guide or monitor treatment for MRSA infections. Performed at [facility], [place] [address] [place], [place] [address]       Radiology Studies: No results found.      Scheduled Meds: . aspirin  81 mg Per Tube Daily  . chlorhexidine gluconate (MEDLINE KIT)  15 mL Mouth Rinse BID  . Chlorhexidine Gluconate  Cloth  6 each Topical Daily  . enoxaparin (LOVENOX) injection  40 mg Subcutaneous Q24H  . free water  300 mL Per Tube Q8H  . insulin aspart  0-9 Units Subcutaneous Q4H  . mouth rinse  15 mL Mouth Rinse 10 times per day  . metoprolol tartrate  12.5 mg Per Tube BID  . midodrine  5 mg Per Tube TID WC  . multivitamin  15 mL Per Tube Daily  . nutrition supplement (JUVEN)  1 packet Per Tube BID  . pantoprazole sodium  40 mg Per Tube Daily  . phosphorus  500 mg Per Tube BID  . zinc sulfate  220 mg Per Tube Daily   Continuous Infusions: . sodium chloride 500 mL (11/17/18 1338)  . ceftazidime avibactam (AVYCAZ) IVPB 1.25 g (11/17/18 0934)  . dextrose 75 mL/hr at 11/17/18 1300  . feeding supplement (PIVOT 1.5 CAL) 1,000 mL (11/17/18 1214)  . levETIRAcetam Stopped (11/17/18 0556)  . norepinephrine (LEVOPHED) Adult infusion Stopped (11/14/18 0057)  . phenytoin (DILANTIN) IV 130 mg (11/17/18 1532)  . valproate sodium 125 mg (11/17/18 1208)     Assessment & Plan:    1. Gram -ve Bacteremia/UTI/spesis with septic shock: POA. Had indwelling foley catheter on arrival that has been removed. Now on external catheter and able to void. Blood cx greq Klebsiella, urine cx grew Klebsiella and proteus. ID following and recommend 7 days of antibiotics through 7/13  2. Chronic systolic CHF: EF 86-76%. Compensated currently.   3. Paroxysmal A. fib: Continue low-dose metoprolol    Not on anticoagulation.    4. Chronic respiratory failure: Vent dependent, on trach.  PCCM following and managing Vent  5. Hypernatremia: Started on D5W on 7/9 and increased free water.  Check BMP tomorrow.  6. History of coronary disease: On aspirin/metoprolol   7. Hypokalemia/Hypophosphatemia  :Supplement PRN  8.Elevated liver enzymes: Thought to be secondary to shock liver.   9. History of seizure disorder: On IV antiepileptic drugs.  Currently on Dilantin, Keppra, valproic acid  10.  COPD: Currently stable.  Continue bronchodilators as needed.  11.  Acute on chronic anemia: likely Due to chronic disease.Hgb trending down 12.5>10.1>9.3->8.6   Continue to monitor.  No signs of bleeding as d/w bedside nurse  12: Pressure ulcers: Supportive management  13. Goals of care: Palliative care following closely. Remains full code.     DVT prophylaxis: LOVENOX Code Status: Full code Family / Patient Communication: None at bedside Disposition Plan: Back to [facility] on after abx on  Monday/Tuesday     LOS: 5 days    Time spent: 25 minutes    Guilford Shi, MD [facility] Pager [phone]  If 7PM-7AM, please contact night-coverage [email or web address] Password [facility] 11/17/2018, 3:40 PM

## 2018-11-17 NOTE — Progress Notes (Signed)
NAME:  Melissa Cochran, MRN:  [id number], DOB:  1935-06-07, LOS: 5 ADMISSION DATE:  11/11/2018, CONSULTATION DATE: 11/11/2018 REFERRING MD: ED, CHIEF COMPLAINT: Sepsis  Brief History   83 year old woman with history of chronic hypoxic respiratory failure with ventilator/PEG tube dependence, CAD, HFrEF (EF 45-50% 06/2017), paroxysmal atrial fibrillation, adnexal mass, COPD, CVA with chronic encephalopathy, seizure disorder, DVT, GERD, hypertension, recurrent UTI presenting from [facility] with acute on chronic encephalopathy and hypotension found to have UTI.  History of present illness   83 year old woman with history of chronic hypoxic respiratory failure with ventilator/PEG tube dependence, CAD, HFrEF (EF 45-50% 06/2017), paroxysmal atrial fibrillation, adnexal mass, COPD, CVA with chronic encephalopathy, seizure disorder, DVT, GERD, hypertension, recurrent UTI presenting from [facility] with acute on chronic encephalopathy and hypotension.  Per EMR her baseline level of consciousness is eyes open and tracking.  Per daughter, she was corresponding with the [facility] nurses.  There were concerns on day of admission for fever, tachycardia.  At one point, she was reported to appear gray.  There was also report of emesis.  Past Medical History  Chronic hypoxic respiratory failure with ventilator/PEG tube dependence, CAD, HFrEF (EF 45-50% 06/2017), paroxysmal atrial fibrillation, adnexal mass, COPD, CVA with chronic encephalopathy, seizure disorder, DVT, GERD, hypertension, recurrent UTI  Significant Hospital Events   7/5> admit  Consults:  7/5: Palliaitve: [place] 7/6: Infectious Disease: CRE   Procedures:  none  Significant Diagnostic Tests:  CXR 7/4> Improvement in bilateral multifocal airspace disease with mild residual streaky opacities at the bases. Tracheostomy tube and left upper extremity PICC remain in place.   CT Chest/ab/pelvis 7/5: 1. Extensive patchy tree-in-bud opacities and scattered  centrilobular nodularity throughout both lungs with associated scattered mild bronchiectasis. No appreciable interval change since 09/27/2018 CT. Differential includes recurrent aspiration and/or atypical mycobacterial infection (MAI). 2. Dense consolidation with air bronchograms at the lung bases bilaterally, unchanged, favor atelectasis and/or postinfectious/postinflammatory scarring. 3. Well-positioned tracheostomy tube, left PICC and percutaneous gastrostomy and rectal tubes. 4. Mild mediastinal lymphadenopathy is stable and probably reactive. 5. Trace dependent left pleural effusion. 6. No evidence of bowel obstruction. Fluid levels scattered in the large bowel, indicative of a nonspecific diarrheal/malabsorptive state. Mild sigmoid diverticulosis, with no evidence of acute diverticulitis. 7. Mildly irregular liver surface, cannot exclude cirrhosis. 8. Stable multilocular 9.0 cm left adnexal cystic mass, cannot exclude left ovarian neoplasm. 9. Nonobstructive bilateral nephrolithiasis. 10.  Aortic Atherosclerosis (ICD10-I70.0).   Micro Data:  Blood cx 7/4> +Kleb pna, +enterbacteraceia, +CRE Urine cx 7/4> >100,000 colonies proteus mirabilis, 70,000 klebsiella Covid Negative  C diff negative  Resp Cx 7/5: MODERATE ACINETOBACTER CALCOACETICUS/BAUMANNII COMPLEX  MODERATE STENOTROPHOMONAS MALTOPHILIA   Antimicrobials:  Vanc 7/4>7/5 Meropenem 7/4>7/5  ceftaz-Avabactam 7/5  Interim history/subjective:  7/10: some slow downward trend of hgb without overt bleeding. Remains on vent support 100% at this time. Non responsive on exam.   Objective   Blood pressure (!) 107/55, pulse (!) 112, temperature 99.2 F (37.3 C), temperature source Axillary, resp. rate (!) 23, height 5\' 7"  (1.702 m), weight 71.3 kg, SpO2 100 %.    Vent Mode: PCV FiO2 (%):  [40 %] 40 % Set Rate:  [20 bmp] 20 bmp PEEP:  [5 cmH20] 5 cmH20 Plateau Pressure:  [21 cmH20-27 cmH20] 24 cmH20   Intake/Output  Summary (Last 24 hours) at 11/17/2018 0758 Last data filed at 11/17/2018 0600 Gross per 24 hour  Intake 4999.44 ml  Output 1825 ml  Net 3174.44 ml   UOP 535cc on 7/6  Filed Weights   11/15/18 0500 11/16/18 0500 11/17/18 0240  Weight: 67.7 kg 68 kg 71.3 kg    Physical Exam:  General: 83 y.o. female chronically ill, trach, vent dependent HEENT: Trach in place, ncat, edentulous Cardio: irregularly irregular Lungs: bilateral vented breath sounds Abdomen: distended with peg in place. No grimace elicited on exam, bs decreased but present Extremities: No edema Neuro: no response to pain, voice   Assessment & Plan:   Septic Shock from presumed urinary tract infection  Possible ischemic bowel related to shock and supply/demand ischemia  MDRO Bacteremia, CRE Klebsiella pneumonia  Multiorgan Failure secondary to above  - cont ceftaz-avibactam (recommend 7 days, began 7/6) - ID consulted, appreciate recs - goal MAP >75, restart levophed as needed to achieve     Acute on Chronic Respiratory Failure, vent dependent respiratory failure  - full vent support, wean as able to ps  - baseline vent dependent - Wean FiO2 to maintain SpO2 >90 COPD: - continue duonebs    Hypernatremia -on d5w which is appropriate -cont free water as well -Na somehwat improved 1600->158 Hypokalemia: -replacing AGMA: resolved Lactic Acidosis: improved Acute renal failure: resolved - making 927ml/24h uop   Transaminitis: improving Acute shock liver - 2/2 shock, plan per above - continue to observe -trending down  Normocytic Anemia Hx IDA -Hgb trending down 12.5>10.1>9.3->8.6  -No overt bleed noted. - continue to monitor - hold iron in setting of active infection   Seizure history: - iv AEDs, dilantin, keppra, valpro   CAD: - continue asa  Prolonged qt:  -recheck ekg -avoid offending agents   Severe protein calorie malnutrition  - continue tube feeds - nutrition consulted - f/u  zinc, vit C levels  Goals of care Per palliative discussion with daughter, remains full code with full aggressive treatment  Best practice:  Diet: NG tube feeds Pain/Anxiety/Delirium protocol (if indicated): Fentanyl PRN VAP protocol (if indicated): Ordered DVT prophylaxis: SubQ Lovenox GI prophylaxis: Protonix Glucose control: Monitor Mobility: PT ok from ccm standpoint Code Status: Full code Family Communication: per primary Disposition: ok to transfer from ICU to intermediate care per CCM   Critical care time: The patient is critically ill with multiple organ systems failure and requires high complexity decision making for assessment and support, frequent evaluation and titration of therapies, application of advanced monitoring technologies and extensive interpretation of multiple databases.  Critical care time 35 mins. This represents my time independent of the NPs time taking care of the pt. This is excluding procedures.    Audria Nine DO Pager: [phone] After hours pager: [phone]  [facility] 11/17/2018, 8:08 AM

## 2018-11-17 NOTE — Progress Notes (Deleted)
CRITICAL VALUE ALERT  Critical Value:  WBC 396  Date & Time Notied:  11/17/18 11:48 PM   Provider Notified: Dr. Jimmy Footman  Orders Received/Actions taken: awaiting orders

## 2018-11-17 NOTE — Progress Notes (Addendum)
[place] for IV Phenytoin  Indication: Seizure disorder   No Known Allergies  Vital Signs: Temp: 99.2 F (37.3 C) (07/10 0406) Temp Source: Axillary (07/10 0406) BP: 107/55 (07/10 0742) Pulse Rate: 112 (07/10 0742) Intake/Output from previous day: 07/09 0701 - 07/10 0700 In: 4999.4 [I.V.:761.6; NG/GT:3340; IV Piggyback:897.8] Out: 1825 [Urine:900; Stool:925] Intake/Output from this shift: No intake/output data recorded.  Labs: Recent Labs    11/14/18 1629 11/15/18 0536 11/16/18 0459 11/16/18 1440 11/17/18 0402  WBC  --  7.8  --   --  9.6  HGB  --  9.2*  --   --  8.6*  HCT  --  31.4*  --   --  30.7*  PLT  --  180  --   --  232  CREATININE  --  0.95 0.65  --  0.68  MG 2.5*  --   --   --   --   PHOS 1.8*  --  <1.0*  --  2.5  ALBUMIN  --  1.8*  --  1.8* 1.8*  PROT  --  5.8*  --  5.7*  --   AST  --  332*  --  118*  --   ALT  --  646*  --  424*  --   ALKPHOS  --  120  --  144*  --   BILITOT  --  1.0  --  1.2  --   BILIDIR  --   --   --  0.6*  --   IBILI  --   --   --  0.6  --    Estimated Creatinine Clearance: 53.6 mL/min (by C-G formula based on SCr of 0.68 mg/dL).   Assessment: Pt on IV phenytoin 100 mg BID and IV phenytoin 150 mg qhs. Scr 0.95 (baseline ~0.3-0.4), albumin is 1.8, DPH level 4.7 Corrected Phenytoin Level is ~7.9  Goal of Therapy:  Phenytoin level 10-20 mg/L  Plan:  Increase IV phenytoin to 130 mg TID Re-check levels in 5 to 7 days   Acey Lav, PharmD  PGY1 Pharmacy Resident [facility] [phone]

## 2018-11-18 LAB — GLUCOSE, CAPILLARY
Glucose-Capillary: 111 mg/dL — ABNORMAL HIGH (ref 70–99)
Glucose-Capillary: 121 mg/dL — ABNORMAL HIGH (ref 70–99)
Glucose-Capillary: 110 mg/dL — ABNORMAL HIGH (ref 70–99)
Glucose-Capillary: 112 mg/dL — ABNORMAL HIGH (ref 70–99)
Glucose-Capillary: 118 mg/dL — ABNORMAL HIGH (ref 70–99)

## 2018-11-18 LAB — BASIC METABOLIC PANEL
Anion gap: 10 (ref 5–15)
BUN: 74 mg/dL — ABNORMAL HIGH (ref 8–23)
CO2: 19 mmol/L — ABNORMAL LOW (ref 22–32)
Calcium: 8.4 mg/dL — ABNORMAL LOW (ref 8.9–10.3)
Chloride: 126 mmol/L — ABNORMAL HIGH (ref 98–111)
Creatinine, Ser: 0.61 mg/dL (ref 0.44–1.00)
GFR calc Af Amer: >60 mL/min (ref >60)
GFR calc non Af Amer: >60 mL/min (ref >60)
Glucose, Bld: 136 mg/dL — ABNORMAL HIGH (ref 70–99)
Potassium: 3.2 mmol/L — ABNORMAL LOW (ref 3.5–5.1)
Sodium: 155 mmol/L — ABNORMAL HIGH (ref 135–145)

## 2018-11-18 LAB — CREATININE, SERUM
Creatinine, Ser: 0.53 mg/dL (ref 0.44–1.00)
GFR calc Af Amer: >60 mL/min (ref >60)
GFR calc non Af Amer: >60 mL/min (ref >60)

## 2018-11-18 MED ORDER — POTASSIUM CHLORIDE 10 MEQ/100ML IV SOLN
10.0000 meq | INTRAVENOUS | Status: AC
  Administered 2018-11-18: 10 meq via INTRAVENOUS
  Filled 2018-11-18 (×2): qty 100

## 2018-11-18 MED ORDER — LOPERAMIDE HCL 2 MG PO CAPS
4.0000 mg | ORAL_CAPSULE | Freq: Once | ORAL | Status: AC
  Administered 2018-11-18: 4 mg via ORAL
  Filled 2018-11-18: qty 2

## 2018-11-18 MED ORDER — POTASSIUM CHLORIDE 10 MEQ/100ML IV SOLN
10.0000 meq | Freq: Once | INTRAVENOUS | Status: AC
  Administered 2018-11-18: 10 meq via INTRAVENOUS

## 2018-11-18 MED ORDER — POTASSIUM CHLORIDE 20 MEQ PO PACK
40.0000 meq | PACK | Freq: Once | ORAL | Status: AC
  Administered 2018-11-18: 40 meq via ORAL
  Filled 2018-11-18: qty 2

## 2018-11-18 NOTE — Progress Notes (Addendum)
PROGRESS NOTE    Melissa Cochran  VZD:[id number]  DOB: Dec 27, 1935  DOA: 11/11/2018 PCP: [name], MD  Brief Narrative:   83 year old female with history of chronic hypoxic respiratory failure on ventilator through trach collar, PEG, coronary artery  disease, heart failure with reduced ejection fraction of 45 to 50%, proximal A. fib, adnexal mass, COPD, CVA with chronic encephalopathy, seizure disorder, DVT, GERD, hypertension, recurrent UTI who presented from [facility] with acute on chronic encephalopathy and hypotension.  Patient's hospital course was remarkable for septic shock secondary to gram-negative bacteremia, source being urine.  She had to be put on pressure support for septic shock.  Currently off pressors and hemodynamically stable.  ID was following.  Plan is to complete IV antibiotic course before discharge to [facility]   Subjective:  Patient non responsive, non verbal. On trach,PEG,  external urinary catheter/rectal pouch . Audible upper airway sounds.  Objective: Vitals:   11/18/18 0808 11/18/18 0900 11/18/18 1029 11/18/18 1133  BP:  (!) 117/56 117/60   Pulse:  (!) 113 (!) 113   Resp:  (!) 40    Temp: 99.4 F (37.4 C)     TempSrc: Axillary     SpO2:  100%  100%  Weight:      Height:        Intake/Output Summary (Last 24 hours) at 11/18/2018 1142 Last data filed at 11/18/2018 0730 Gross per 24 hour  Intake 4001.67 ml  Output 2975 ml  Net 1026.67 ml   Filed Weights   11/16/18 0500 11/17/18 0240 11/18/18 0500  Weight: 68 kg 71.3 kg 73.3 kg    Physical Examination:  General exam: Unresponsive, non verbal, s/p trach  Respiratory system: Clear to auscultation. Respiratory effort normal except for upper airway sounds Cardiovascular system: S1 & S2 heard, RRR. No JVD, murmurs, rubs, gallops or clicks. No pedal edema. Gastrointestinal system: Abdomen is nondistended, soft and nontender. S/p PEG Central nervous system: non verbal, does not follow  commands Skin: No rashes, has pressure ulcers on the lumbar region, sacral region, bilateral lower extremities. Psychiatry: . Mood & affect  Cannot be assessed.     Data Reviewed: I have personally reviewed following labs and imaging studies  CBC: Recent Labs  Lab 11/11/18 2236  11/12/18 0254 11/12/18 1614 11/13/18 0805 11/15/18 0536 11/17/18 0402  WBC 45.6*  --  23.0* 20.3* 14.3* 7.8 9.6  NEUTROABS 41.7*  --   --   --  12.2* 6.2  --   HGB 11.7*   < > 12.5 10.1* 9.3* 9.2* 8.6*  HCT 39.3   < > 41.5 33.1* 30.0* 31.4* 30.7*  MCV 96.6  --  95.4 93.0 92.3 96.6 100.7*  PLT 387  --  338 298 264 180 232   < > = values in this interval not displayed.   Basic Metabolic Panel: Recent Labs  Lab 11/12/18 0254  11/13/18 0805 11/13/18 1624 11/14/18 0556 11/14/18 1629 11/15/18 0536 11/16/18 0459 11/17/18 0402 11/18/18 0435  NA 132*   < > 140  --  143  --  154* 160* 158*  --   K 5.4*   < > 3.8  --  2.9*  --  3.0* 4.1 2.9*  --   CL 97*   < > 105  --  108  --  126* >130* 129*  --   CO2 16*   < > 19*  --  18*  --  16* 18* 19*  --   GLUCOSE 101*   < >  139*  --  320*  --  192* 147* 127*  --   BUN 57*   < > 67*  --  73*  --  80* 81* 79*  --   CREATININE 1.49*   < > 1.29*  --  1.31*  --  0.95 0.65 0.68 0.53  CALCIUM 10.2   < > 8.7*  --  8.8*  --  8.7* 9.2 8.3*  --   MG 2.4  --  2.2 2.4 2.6* 2.5*  --   --   --   --   PHOS 3.3  --  3.4 3.2 2.5 1.8*  --  <1.0* 2.5  --    < > = values in this interval not displayed.   GFR: Estimated Creatinine Clearance: 53.6 mL/min (by C-G formula based on SCr of 0.53 mg/dL). Liver Function Tests: Recent Labs  Lab 11/12/18 0254 11/12/18 1614 11/13/18 0805 11/15/18 0536 11/16/18 1440 11/17/18 0402  AST 558* 382* 298* 332* 118*  --   ALT 433* 340* 350* 646* 424*  --   ALKPHOS 180* 110 103 120 144*  --   BILITOT 1.9* 1.4* 1.6* 1.0 1.2  --   PROT 6.7 5.3* 5.8* 5.8* 5.7*  --   ALBUMIN 2.2* 1.6* 1.7* 1.8* 1.8* 1.8*   No results for input(s): LIPASE,  AMYLASE in the last 168 hours. No results for input(s): AMMONIA in the last 168 hours. Coagulation Profile: Recent Labs  Lab 11/12/18 0254  INR 1.7*   Cardiac Enzymes: No results for input(s): CKTOTAL, CKMB, CKMBINDEX, TROPONINI in the last 168 hours. BNP (last 3 results) No results for input(s): PROBNP in the last 8760 hours. HbA1C: No results for input(s): HGBA1C in the last 72 hours. CBG: Recent Labs  Lab 11/17/18 1610 11/17/18 1934 11/17/18 2334 11/18/18 0321 11/18/18 0813  GLUCAP 117* 125* 130* 111* 121*   Lipid Profile: No results for input(s): CHOL, HDL, LDLCALC, TRIG, CHOLHDL, LDLDIRECT in the last 72 hours. Thyroid Function Tests: No results for input(s): TSH, T4TOTAL, FREET4, T3FREE, THYROIDAB in the last 72 hours. Anemia Panel: No results for input(s): VITAMINB12, FOLATE, FERRITIN, TIBC, IRON, RETICCTPCT in the last 72 hours. Sepsis Labs: Recent Labs  Lab 11/12/18 0641 11/12/18 0959 11/12/18 1407 11/13/18 0805  LATICACIDVEN 9.1* 4.2* 4.5* 3.1*    Recent Results (from the past 240 hour(s))  Carbapenem Resistance Panel     Status: Abnormal   Collection Time: 11/11/18  5:18 PM  Result Value Ref Range Status   Carba Resistance IMP Gene NOT DETECTED NOT DETECTED Final   Carba Resistance VIM Gene NOT DETECTED NOT DETECTED Final   Carba Resistance NDM Gene NOT DETECTED NOT DETECTED Final   Carba Resistance KPC Gene DETECTED (A) NOT DETECTED Final   Carba Resistance OXA48 Gene NOT DETECTED NOT DETECTED Final    Comment: (NOTE) Cepheid Carba-R is an FDA-cleared nucleic acid amplification test  (NAAT)for the detection and differentiation of genes encoding the  most prevalent carbapenemases in bacterial isolate samples. Carbapenemase gene identification and implementation of comprehensive  infection control measures are recommended by the CDC to prevent the  spread of the resistant organisms. Performed at [facility], [place] [address], [place],  [place] [address]   SARS Coronavirus 2 (CEPHEID- Performed in [facility] hospital lab), Hosp Order     Status: None   Collection Time: 11/11/18 10:04 PM   Specimen: Nasopharyngeal Swab  Result Value Ref Range Status   SARS Coronavirus 2 NEGATIVE NEGATIVE Final  Comment: (NOTE) If result is NEGATIVE SARS-CoV-2 target nucleic acids are NOT DETECTED. The SARS-CoV-2 RNA is generally detectable in upper and lower  respiratory specimens during the acute phase of infection. The lowest  concentration of SARS-CoV-2 viral copies this assay can detect is 250  copies / mL. A negative result does not preclude SARS-CoV-2 infection  and should not be used as the sole basis for treatment or other  patient management decisions.  A negative result may occur with  improper specimen collection / handling, submission of specimen other  than nasopharyngeal swab, presence of viral mutation(s) within the  areas targeted by this assay, and inadequate number of viral copies  (<250 copies / mL). A negative result must be combined with clinical  observations, patient history, and epidemiological information. If result is POSITIVE SARS-CoV-2 target nucleic acids are DETECTED. The SARS-CoV-2 RNA is generally detectable in upper and lower  respiratory specimens dur ing the acute phase of infection.  Positive  results are indicative of active infection with SARS-CoV-2.  Clinical  correlation with patient history and other diagnostic information is  necessary to determine patient infection status.  Positive results do  not rule out bacterial infection or co-infection with other viruses. If result is PRESUMPTIVE POSTIVE SARS-CoV-2 nucleic acids MAY BE PRESENT.   A presumptive positive result was obtained on the submitted specimen  and confirmed on repeat testing.  While 2019 novel coronavirus  (SARS-CoV-2) nucleic acids may be present in the submitted sample  additional confirmatory testing may be necessary for  epidemiological  and / or clinical management purposes  to differentiate between  SARS-CoV-2 and other Sarbecovirus currently known to infect humans.  If clinically indicated additional testing with an alternate test  methodology [phone]) is advised. The SARS-CoV-2 RNA is generally  detectable in upper and lower respiratory sp ecimens during the acute  phase of infection. The expected result is Negative. Fact Sheet for Patients:  StrictlyIdeas.no Fact Sheet for Healthcare Providers: BankingDealers.co.za This test is not yet approved or cleared by the [place] FDA and has been authorized for detection and/or diagnosis of SARS-CoV-2 by FDA under an Emergency Use Authorization (EUA).  This EUA will remain in effect (meaning this test can be used) for the duration of the COVID-19 declaration under Section 564(b)(1) of the Act, 21 U.S.C. section 360bbb-3(b)(1), unless the authorization is terminated or revoked sooner. Performed at [facility], [place] [address], [place], [place] [address]   Urine culture     Status: Abnormal   Collection Time: 11/11/18 10:17 PM   Specimen: Urine, Catheterized  Result Value Ref Range Status   Specimen Description URINE, CATHETERIZED  Final   Special Requests NONE  Final   Culture (A)  Final    >=100,000 COLONIES/mL PROTEUS MIRABILIS 70,000 COLONIES/mL KLEBSIELLA PNEUMONIAE PREVIOUS CRC POSITIVE IN BLOOD CULTURE Performed at [facility], [place] [address], [place], [place] [address]    Report Status 11/15/2018 FINAL  Final   Organism ID, Bacteria PROTEUS MIRABILIS (A)  Final   Organism ID, Bacteria KLEBSIELLA PNEUMONIAE (A)  Final      Susceptibility   Proteus mirabilis - MIC*    AMPICILLIN >=32 RESISTANT Resistant     CEFAZOLIN >=64 RESISTANT Resistant     CEFTRIAXONE >=64 RESISTANT Resistant     CIPROFLOXACIN >=4 RESISTANT Resistant     GENTAMICIN <=1 SENSITIVE Sensitive     IMIPENEM 2  SENSITIVE Sensitive     NITROFURANTOIN 128 RESISTANT Resistant  TRIMETH/SULFA <=20 SENSITIVE Sensitive     AMPICILLIN/SULBACTAM 8 SENSITIVE Sensitive     PIP/TAZO <=4 SENSITIVE Sensitive     * >=100,000 COLONIES/mL PROTEUS MIRABILIS  Blood Culture (routine x 2)     Status: Abnormal   Collection Time: 11/11/18 10:37 PM   Specimen: BLOOD RIGHT ARM  Result Value Ref Range Status   Specimen Description BLOOD RIGHT ARM  Final   Special Requests   Final    BOTTLES DRAWN AEROBIC AND ANAEROBIC Blood Culture results may not be optimal due to an excessive volume of blood received in culture bottles   Culture  Setup Time   Final    GRAM NEGATIVE RODS AEROBIC BOTTLE ONLY CRITICAL RESULT CALLED TO, READ BACK BY AND VERIFIED WITH: Konrad Saha AT 1478 11/12/2018 BY L BENFIELD Performed at [facility], [place] [address] [place], [place] [address]    Culture (A)  Final    KLEBSIELLA PNEUMONIAE CONFIRMED CARBAPENEMASE RESISTANT ENTEROBACTERIACAE    Report Status 11/14/2018 FINAL  Final   Organism ID, Bacteria KLEBSIELLA PNEUMONIAE  Final      Susceptibility   Klebsiella pneumoniae - MIC*    AMPICILLIN >=32 RESISTANT Resistant     CEFAZOLIN >=64 RESISTANT Resistant     CEFEPIME 16 INTERMEDIATE Intermediate     CEFTAZIDIME >=64 RESISTANT Resistant     CEFTRIAXONE >=64 RESISTANT Resistant     CIPROFLOXACIN >=4 RESISTANT Resistant     GENTAMICIN 8 INTERMEDIATE Intermediate     IMIPENEM 8 INTERMEDIATE Intermediate     TRIMETH/SULFA >=320 RESISTANT Resistant     AMPICILLIN/SULBACTAM >=32 RESISTANT Resistant     PIP/TAZO >=128 RESISTANT Resistant     Extended ESBL NEGATIVE Sensitive     * KLEBSIELLA PNEUMONIAE  Blood Culture ID Panel (Reflexed)     Status: Abnormal   Collection Time: 11/11/18 10:37 PM  Result Value Ref Range Status   Enterococcus species NOT DETECTED NOT DETECTED Final   Listeria monocytogenes NOT DETECTED NOT DETECTED Final   Staphylococcus species NOT DETECTED NOT  DETECTED Final   Staphylococcus aureus (BCID) NOT DETECTED NOT DETECTED Final   Streptococcus species NOT DETECTED NOT DETECTED Final   Streptococcus agalactiae NOT DETECTED NOT DETECTED Final   Streptococcus pneumoniae NOT DETECTED NOT DETECTED Final   Streptococcus pyogenes NOT DETECTED NOT DETECTED Final   Acinetobacter baumannii NOT DETECTED NOT DETECTED Final   Enterobacteriaceae species DETECTED (A) NOT DETECTED Final    Comment: Enterobacteriaceae represent a large family of gram-negative bacteria, not a single organism. CRITICAL RESULT CALLED TO, READ BACK BY AND VERIFIED WITH: M BITONTI,PHARMD AT 1308 11/12/2018 BY L BENFIELD    Enterobacter cloacae complex NOT DETECTED NOT DETECTED Final   Escherichia coli NOT DETECTED NOT DETECTED Final   Klebsiella oxytoca NOT DETECTED NOT DETECTED Final   Klebsiella pneumoniae DETECTED (A) NOT DETECTED Final    Comment: CRITICAL RESULT CALLED TO, READ BACK BY AND VERIFIED WITH: M BITONTI,PHARMD AT 1938 11/12/2018 BY L BENFIELD    Proteus species NOT DETECTED NOT DETECTED Final   Serratia marcescens NOT DETECTED NOT DETECTED Final   Carbapenem resistance DETECTED (A) NOT DETECTED Final    Comment: CRITICAL RESULT CALLED TO, READ BACK BY AND VERIFIED WITH: M BITONTI,PHARMD AT 1938 11/12/2018 BY L BENFIELD    Haemophilus influenzae NOT DETECTED NOT DETECTED Final   Neisseria meningitidis NOT DETECTED NOT DETECTED Final   Pseudomonas aeruginosa NOT DETECTED NOT DETECTED Final   Candida albicans NOT DETECTED NOT DETECTED Final   Candida  glabrata NOT DETECTED NOT DETECTED Final   Candida krusei NOT DETECTED NOT DETECTED Final   Candida parapsilosis NOT DETECTED NOT DETECTED Final   Candida tropicalis NOT DETECTED NOT DETECTED Final    Comment: Performed at [facility], [place] [address] [place], [place] [address]  Blood Culture (routine x 2)     Status: None   Collection Time: 11/11/18 10:39 PM   Specimen: BLOOD RIGHT HAND  Result Value Ref  Range Status   Specimen Description BLOOD RIGHT HAND  Final   Special Requests   Final    BOTTLES DRAWN AEROBIC ONLY Blood Culture adequate volume   Culture   Final    NO GROWTH 5 DAYS Performed at [facility], [place] [address] [place], [place] [address]    Report Status 11/16/2018 FINAL  Final  C difficile quick scan w PCR reflex     Status: None   Collection Time: 11/12/18  2:09 AM   Specimen: STOOL  Result Value Ref Range Status   C Diff antigen NEGATIVE NEGATIVE Final   C Diff toxin NEGATIVE NEGATIVE Final   C Diff interpretation No C. difficile detected.  Final    Comment: Performed at [facility], [place] [address], [place], [place] [address]  Culture, respiratory (tracheal aspirate)     Status: None   Collection Time: 11/12/18  3:47 AM   Specimen: Tracheal Aspirate; Respiratory  Result Value Ref Range Status   Specimen Description TRACHEAL ASPIRATE  Final   Special Requests NONE  Final   Gram Stain   Final    FEW WBC PRESENT, PREDOMINANTLY PMN MODERATE GRAM NEGATIVE RODS FEW GRAM POSITIVE COCCI IN PAIRS RARE GRAM POSITIVE RODS    Culture   Final    MODERATE ACINETOBACTER CALCOACETICUS/BAUMANNII COMPLEX MODERATE STENOTROPHOMONAS MALTOPHILIA MULTI-DRUG RESISTANT ORGANISM CRITICAL RESULT CALLED TO, READ BACK BY AND VERIFIED WITH: PHARMD [place] [id number] AT 945 AM BY CM Performed at [facility], [place] [address] [place], [place] [address]    Report Status 11/16/2018 FINAL  Final   Organism ID, Bacteria STENOTROPHOMONAS MALTOPHILIA  Final   Organism ID, Bacteria ACINETOBACTER CALCOACETICUS/BAUMANNII COMPLEX  Final      Susceptibility   Acinetobacter calcoaceticus/baumannii complex - MIC*    CEFTAZIDIME >=64 RESISTANT Resistant     CEFTRIAXONE >=64 RESISTANT Resistant     CIPROFLOXACIN >=4 RESISTANT Resistant     GENTAMICIN 8 INTERMEDIATE Intermediate     IMIPENEM >=16 RESISTANT Resistant     PIP/TAZO >=128 RESISTANT Resistant     TRIMETH/SULFA 160  RESISTANT Resistant     CEFEPIME >=64 RESISTANT Resistant     AMPICILLIN/SULBACTAM 4 SENSITIVE Sensitive     * MODERATE ACINETOBACTER CALCOACETICUS/BAUMANNII COMPLEX   Stenotrophomonas maltophilia - MIC*    LEVOFLOXACIN 1 SENSITIVE Sensitive     TRIMETH/SULFA 40 SENSITIVE Sensitive     * MODERATE STENOTROPHOMONAS MALTOPHILIA  MRSA PCR Screening     Status: None   Collection Time: 11/14/18  9:19 AM   Specimen: Nasal Mucosa; Nasopharyngeal  Result Value Ref Range Status   MRSA by PCR NEGATIVE NEGATIVE Final    Comment:        The GeneXpert MRSA Assay (FDA approved for NASAL specimens only), is one component of a comprehensive MRSA colonization surveillance program. It is not intended to diagnose MRSA infection nor to guide or monitor treatment for MRSA infections. Performed at [facility], [place] [address] [place], [place] [address]  Radiology Studies: No results found.      Scheduled Meds:  aspirin  81 mg Per Tube Daily   chlorhexidine gluconate (MEDLINE KIT)  15 mL Mouth Rinse BID   Chlorhexidine Gluconate Cloth  6 each Topical Daily   enoxaparin (LOVENOX) injection  40 mg Subcutaneous Q24H   free water  300 mL Per Tube Q8H   insulin aspart  0-9 Units Subcutaneous Q4H   mouth rinse  15 mL Mouth Rinse 10 times per day   metoprolol tartrate  12.5 mg Per Tube BID   midodrine  5 mg Per Tube TID WC   multivitamin  15 mL Per Tube Daily   nutrition supplement (JUVEN)  1 packet Per Tube BID   pantoprazole sodium  40 mg Per Tube Daily   phosphorus  500 mg Per Tube BID   zinc sulfate  220 mg Per Tube Daily   Continuous Infusions:  sodium chloride 10 mL/hr at 11/18/18 0700   ceftazidime avibactam (AVYCAZ) IVPB 1.25 g (11/18/18 1027)   dextrose 75 mL/hr at 11/18/18 0700   feeding supplement (PIVOT 1.5 CAL) 1,000 mL (11/18/18 0517)   levETIRAcetam Stopped (11/18/18 0520)   norepinephrine (LEVOPHED) Adult infusion Stopped (11/14/18 0057)    phenytoin (DILANTIN) IV Stopped (11/17/18 2214)   valproate sodium Stopped (11/18/18 7276)    Assessment & Plan:    1. Gram -ve Bacteremia/UTI/spesis with septic shock: POA. Had indwelling foley catheter on arrival that has been removed. Now on external catheter and able to void. Blood cx greq Klebsiella, urine cx grew Klebsiella and proteus. ID following and recommend 7 days of antibiotics through 7/13  2. Chronic systolic CHF: EF 18-48%. Compensated currently.   3. Paroxysmal A. fib: Continue low-dose metoprolol    Not on anticoagulation.    4. Chronic respiratory failure: Vent dependent, on trach.  PCCM following and managing Vent. Alerted RT regarding upper airway sounds today, will address.   5. Hypernatremia: Started on D5W on 7/9 and increased free water.  Check BMP today/ tomorrow.  6. History of coronary disease: On aspirin/metoprolol   7. Hypokalemia/Hypophosphatemia  :Supplement PRN. Potassium level 2.9 yesterday. Repeat level today/am  8.Elevated liver enzymes: Thought to be secondary to shock liver.   9. History of seizure disorder: On IV antiepileptic drugs.  Currently on Dilantin, Keppra, valproic acid  10.  COPD: Currently stable.  Continue bronchodilators as needed.  11.  Acute on chronic anemia: likely Due to chronic disease.Hgb trending down 12.5>10.1>9.3->8.6   Continue to monitor.  No signs of bleeding as d/w bedside nurse. H&h in am  12: Pressure ulcers: Supportive management  13. Goals of care: Palliative care following closely. Remains full code.     DVT prophylaxis: LOVENOX Code Status: Full code Family / Patient Communication: None at bedside Disposition Plan: Back to [facility] on after abx on  Monday/Tuesday     LOS: 6 days    Time spent: 25 minutes    [place]ford Shi, MD [facility] Pager [phone]  If 7PM-7AM, please contact night-coverage [email or web address] Password [facility] 11/18/2018, 11:42 AM

## 2018-11-19 ENCOUNTER — Inpatient Hospital Stay (HOSPITAL_COMMUNITY): Payer: Medicare Other

## 2018-11-19 LAB — GLUCOSE, CAPILLARY
Glucose-Capillary: 104 mg/dL — ABNORMAL HIGH (ref 70–99)
Glucose-Capillary: 112 mg/dL — ABNORMAL HIGH (ref 70–99)
Glucose-Capillary: 116 mg/dL — ABNORMAL HIGH (ref 70–99)
Glucose-Capillary: 118 mg/dL — ABNORMAL HIGH (ref 70–99)
Glucose-Capillary: 133 mg/dL — ABNORMAL HIGH (ref 70–99)
Glucose-Capillary: 138 mg/dL — ABNORMAL HIGH (ref 70–99)

## 2018-11-19 LAB — CBC
HCT: 27.6 % — ABNORMAL LOW (ref 36.0–46.0)
Hemoglobin: 7.7 g/dL — ABNORMAL LOW (ref 12.0–15.0)
MCH: 28.6 pg (ref 26.0–34.0)
MCHC: 27.9 g/dL — ABNORMAL LOW (ref 30.0–36.0)
MCV: 102.6 fL — ABNORMAL HIGH (ref 80.0–100.0)
Platelets: 271 10*3/uL (ref 150–400)
RBC: 2.69 MIL/uL — ABNORMAL LOW (ref 3.87–5.11)
RDW: 18.5 % — ABNORMAL HIGH (ref 11.5–15.5)
WBC: 12.4 10*3/uL — ABNORMAL HIGH (ref 4.0–10.5)
nRBC: 0.2 % (ref 0.0–0.2)

## 2018-11-19 LAB — BASIC METABOLIC PANEL
Anion gap: 8 (ref 5–15)
BUN: 64 mg/dL — ABNORMAL HIGH (ref 8–23)
CO2: 21 mmol/L — ABNORMAL LOW (ref 22–32)
Calcium: 8.5 mg/dL — ABNORMAL LOW (ref 8.9–10.3)
Chloride: 123 mmol/L — ABNORMAL HIGH (ref 98–111)
Creatinine, Ser: 0.55 mg/dL (ref 0.44–1.00)
GFR calc Af Amer: >60 mL/min (ref >60)
GFR calc non Af Amer: >60 mL/min (ref >60)
Glucose, Bld: 118 mg/dL — ABNORMAL HIGH (ref 70–99)
Potassium: 3.9 mmol/L (ref 3.5–5.1)
Sodium: 152 mmol/L — ABNORMAL HIGH (ref 135–145)

## 2018-11-19 MED ORDER — POTASSIUM CHLORIDE 10 MEQ/100ML IV SOLN
10.0000 meq | Freq: Once | INTRAVENOUS | Status: AC
  Administered 2018-11-19: 10 meq via INTRAVENOUS
  Filled 2018-11-19: qty 100

## 2018-11-19 MED ORDER — LOPERAMIDE HCL 2 MG PO CAPS
4.0000 mg | ORAL_CAPSULE | ORAL | Status: DC | PRN
  Administered 2018-11-19 – 2018-11-26 (×6): 4 mg via ORAL
  Filled 2018-11-19 (×6): qty 2

## 2018-11-19 MED ORDER — FUROSEMIDE 10 MG/ML IJ SOLN
20.0000 mg | Freq: Once | INTRAMUSCULAR | Status: AC
  Administered 2018-11-19: 20 mg via INTRAVENOUS
  Filled 2018-11-19: qty 2

## 2018-11-19 NOTE — Progress Notes (Signed)
Palliative:  Discussed with RN concern that [name] is in pain and continues with grimaces and appears tense. She is unresponsive and unable to follow any commands and unable to express any discomfort. Unfortunately [name] also has had some issues with hypotension so options are limited to assist with her discomfort.   GOC have been consistent for continued full aggressive care per multiple palliative notes. I did not speak with family today.   Recommend: - Continue fentanyl as BP allows.  - Consider addition of Tylenol 1000 TID per tube (if liver function allows).  - Could consider tramadol.  - Most pain medications will have some risk given her tenuous state.  - Air mattress, turn q2h with careful positioning.   No charge  Vinie Sill, NP Palliative Medicine Team Pager [phone] (Please see [email or web address] for schedule) Team Phone [phone]    Greater than 50%  of this time was spent counseling and coordinating care related to the above assessment and plan   The above conversation was completed via telephone due to the visitor restrictions during the COVID-19 pandemic. Thorough chart review and discussion with necessary members of the care team was completed as part of assessment.

## 2018-11-19 NOTE — Progress Notes (Signed)
PROGRESS NOTE    Melissa Cochran  DEY:[id number]  DOB: 1936/02/16  DOA: 11/11/2018 PCP: [name], MD  Brief Narrative:   83 year old female with history of chronic hypoxic respiratory failure on ventilator through trach collar, PEG, coronary artery  disease, heart failure with reduced ejection fraction of 45 to 50%, proximal A. fib, adnexal mass, COPD, CVA with chronic encephalopathy, seizure disorder, DVT, GERD, hypertension, recurrent UTI who presented from [facility] with acute on chronic encephalopathy and hypotension.  Patient's hospital course was remarkable for septic shock secondary to gram-negative bacteremia, source being urine.  She had to be put on pressure support for septic shock.  Currently off pressors and hemodynamically stable.  ID was following.  Plan is to complete IV antibiotic course before discharge to [facility]   Subjective:  Patient non responsive, non verbal. Appears tachypneic today. On trach,PEG,  external urinary catheter/rectal pouch with very loose BM. SBP fluctuating 80-120 and spiked temp of 101.2 yesterday. Not on any laxatives. Recent Cdiff -ve on 7/5  Objective: Vitals:   11/19/18 1745 11/19/18 1800 11/19/18 1815 11/19/18 1830  BP: (!) 83/44 (!) 104/53 (!) 110/53 (!) 98/52  Pulse: (!) 112 (!) 114 (!) 105 (!) 103  Resp: (!) 27 (!) 37 (!) 22 (!) 29  Temp:      TempSrc:      SpO2: 100% 100% 100% 100%  Weight:      Height:        Intake/Output Summary (Last 24 hours) at 11/19/2018 1849 Last data filed at 11/19/2018 1815 Gross per 24 hour  Intake 3944.57 ml  Output 1550 ml  Net 2394.57 ml   Filed Weights   11/17/18 0240 11/18/18 0500 11/19/18 0500  Weight: 71.3 kg 73.3 kg 74.1 kg    Physical Examination:  General exam: Unresponsive, non verbal, s/p trach  Respiratory system: Tachypneic, coarse crepts bilaterally. Some upper airways sounds Cardiovascular system: S1 & S2 heard, RRR. No JVD, murmurs, rubs, gallops or clicks. 1+pitting leg  edema Gastrointestinal system: Abdomen is nondistended, soft and nontender. S/p PEG Central nervous system: non verbal, does not follow commands Extremities: Upper &lower extremity edema Skin: No rashes, has pressure ulcers on the lumbar region, sacral region, bilateral lower extremities. Psychiatry: . Mood & affect  Cannot be assessed.     Data Reviewed: I have personally reviewed following labs and imaging studies  CBC: Recent Labs  Lab 11/13/18 0805 11/15/18 0536 11/17/18 0402 11/19/18 0430  WBC 14.3* 7.8 9.6 12.4*  NEUTROABS 12.2* 6.2  --   --   HGB 9.3* 9.2* 8.6* 7.7*  HCT 30.0* 31.4* 30.7* 27.6*  MCV 92.3 96.6 100.7* 102.6*  PLT 264 180 232 314   Basic Metabolic Panel: Recent Labs  Lab 11/13/18 0805 11/13/18 1624 11/14/18 0556 11/14/18 1629 11/15/18 0536 11/16/18 0459 11/17/18 0402 11/18/18 0435 11/19/18 0430  NA 140  --  143  --  154* 160* 158* 155* 152*  K 3.8  --  2.9*  --  3.0* 4.1 2.9* 3.2* 3.9  CL 105  --  108  --  126* >130* 129* 126* 123*  CO2 19*  --  18*  --  16* 18* 19* 19* 21*  GLUCOSE 139*  --  320*  --  192* 147* 127* 136* 118*  BUN 67*  --  73*  --  80* 81* 79* 74* 64*  CREATININE 1.29*  --  1.31*  --  0.95 0.65 0.68 0.61   0.53 0.55  CALCIUM 8.7*  --  8.8*  --  8.7* 9.2 8.3* 8.4* 8.5*  MG 2.2 2.4 2.6* 2.5*  --   --   --   --   --   PHOS 3.4 3.2 2.5 1.8*  --  <1.0* 2.5  --   --    GFR: Estimated Creatinine Clearance: 58 mL/min (by C-G formula based on SCr of 0.55 mg/dL). Liver Function Tests: Recent Labs  Lab 11/13/18 0805 11/15/18 0536 11/16/18 1440 11/17/18 0402  AST 298* 332* 118*  --   ALT 350* 646* 424*  --   ALKPHOS 103 120 144*  --   BILITOT 1.6* 1.0 1.2  --   PROT 5.8* 5.8* 5.7*  --   ALBUMIN 1.7* 1.8* 1.8* 1.8*   No results for input(s): LIPASE, AMYLASE in the last 168 hours. No results for input(s): AMMONIA in the last 168 hours. Coagulation Profile: No results for input(s): INR, PROTIME in the last 168 hours. Cardiac  Enzymes: No results for input(s): CKTOTAL, CKMB, CKMBINDEX, TROPONINI in the last 168 hours. BNP (last 3 results) No results for input(s): PROBNP in the last 8760 hours. HbA1C: No results for input(s): HGBA1C in the last 72 hours. CBG: Recent Labs  Lab 11/19/18 0027 11/19/18 0433 11/19/18 0754 11/19/18 1138 11/19/18 1647  GLUCAP 104* 112* 116* 138* 118*   Lipid Profile: No results for input(s): CHOL, HDL, LDLCALC, TRIG, CHOLHDL, LDLDIRECT in the last 72 hours. Thyroid Function Tests: No results for input(s): TSH, T4TOTAL, FREET4, T3FREE, THYROIDAB in the last 72 hours. Anemia Panel: No results for input(s): VITAMINB12, FOLATE, FERRITIN, TIBC, IRON, RETICCTPCT in the last 72 hours. Sepsis Labs: Recent Labs  Lab 11/13/18 0805  LATICACIDVEN 3.1*    Recent Results (from the past 240 hour(s))  Carbapenem Resistance Panel     Status: Abnormal   Collection Time: 11/11/18  5:18 PM  Result Value Ref Range Status   Carba Resistance IMP Gene NOT DETECTED NOT DETECTED Final   Carba Resistance VIM Gene NOT DETECTED NOT DETECTED Final   Carba Resistance NDM Gene NOT DETECTED NOT DETECTED Final   Carba Resistance KPC Gene DETECTED (A) NOT DETECTED Final   Carba Resistance OXA48 Gene NOT DETECTED NOT DETECTED Final    Comment: (NOTE) Cepheid Carba-R is an FDA-cleared nucleic acid amplification test  (NAAT)for the detection and differentiation of genes encoding the  most prevalent carbapenemases in bacterial isolate samples. Carbapenemase gene identification and implementation of comprehensive  infection control measures are recommended by the CDC to prevent the  spread of the resistant organisms. Performed at [facility], [place] [address], [place], [place] [address]   SARS Coronavirus 2 (CEPHEID- Performed in [place] hospital lab), Hosp Order     Status: None   Collection Time: 11/11/18 10:04 PM   Specimen: Nasopharyngeal Swab  Result Value Ref Range Status   SARS  Coronavirus 2 NEGATIVE NEGATIVE Final    Comment: (NOTE) If result is NEGATIVE SARS-CoV-2 target nucleic acids are NOT DETECTED. The SARS-CoV-2 RNA is generally detectable in upper and lower  respiratory specimens during the acute phase of infection. The lowest  concentration of SARS-CoV-2 viral copies this assay can detect is 250  copies / mL. A negative result does not preclude SARS-CoV-2 infection  and should not be used as the sole basis for treatment or other  patient management decisions.  A negative result may occur with  improper specimen collection / handling, submission of specimen other  than nasopharyngeal swab, presence of viral mutation(s) within the  areas targeted by this assay, and inadequate number of viral copies  (<250 copies / mL). A negative result must be combined with clinical  observations, patient history, and epidemiological information. If result is POSITIVE SARS-CoV-2 target nucleic acids are DETECTED. The SARS-CoV-2 RNA is generally detectable in upper and lower  respiratory specimens dur ing the acute phase of infection.  Positive  results are indicative of active infection with SARS-CoV-2.  Clinical  correlation with patient history and other diagnostic information is  necessary to determine patient infection status.  Positive results do  not rule out bacterial infection or co-infection with other viruses. If result is PRESUMPTIVE POSTIVE SARS-CoV-2 nucleic acids MAY BE PRESENT.   A presumptive positive result was obtained on the submitted specimen  and confirmed on repeat testing.  While 2019 novel coronavirus  (SARS-CoV-2) nucleic acids may be present in the submitted sample  additional confirmatory testing may be necessary for epidemiological  and / or clinical management purposes  to differentiate between  SARS-CoV-2 and other Sarbecovirus currently known to infect humans.  If clinically indicated additional testing with an alternate test    methodology [id number]) is advised. The SARS-CoV-2 RNA is generally  detectable in upper and lower respiratory sp ecimens during the acute  phase of infection. The expected result is Negative. Fact Sheet for Patients:  StrictlyIdeas.no Fact Sheet for Healthcare Providers: BankingDealers.co.za This test is not yet approved or cleared by the [place] FDA and has been authorized for detection and/or diagnosis of SARS-CoV-2 by FDA under an Emergency Use Authorization (EUA).  This EUA will remain in effect (meaning this test can be used) for the duration of the COVID-19 declaration under Section 564(b)(1) of the Act, 21 U.S.C. section 360bbb-3(b)(1), unless the authorization is terminated or revoked sooner. Performed at [facility], [place] [address], [place], [place] [address]   Urine culture     Status: Abnormal   Collection Time: 11/11/18 10:17 PM   Specimen: Urine, Catheterized  Result Value Ref Range Status   Specimen Description URINE, CATHETERIZED  Final   Special Requests NONE  Final   Culture (A)  Final    >=100,000 COLONIES/mL PROTEUS MIRABILIS 70,000 COLONIES/mL KLEBSIELLA PNEUMONIAE PREVIOUS CRC POSITIVE IN BLOOD CULTURE Performed at [facility], [place] [address], [place], [place] [address]    Report Status 11/15/2018 FINAL  Final   Organism ID, Bacteria PROTEUS MIRABILIS (A)  Final   Organism ID, Bacteria KLEBSIELLA PNEUMONIAE (A)  Final      Susceptibility   Proteus mirabilis - MIC*    AMPICILLIN >=32 RESISTANT Resistant     CEFAZOLIN >=64 RESISTANT Resistant     CEFTRIAXONE >=64 RESISTANT Resistant     CIPROFLOXACIN >=4 RESISTANT Resistant     GENTAMICIN <=1 SENSITIVE Sensitive     IMIPENEM 2 SENSITIVE Sensitive     NITROFURANTOIN 128 RESISTANT Resistant     TRIMETH/SULFA <=20 SENSITIVE Sensitive     AMPICILLIN/SULBACTAM 8 SENSITIVE Sensitive     PIP/TAZO <=4 SENSITIVE Sensitive     * >=100,000  COLONIES/mL PROTEUS MIRABILIS  Blood Culture (routine x 2)     Status: Abnormal   Collection Time: 11/11/18 10:37 PM   Specimen: BLOOD RIGHT ARM  Result Value Ref Range Status   Specimen Description BLOOD RIGHT ARM  Final   Special Requests   Final    BOTTLES DRAWN AEROBIC AND ANAEROBIC Blood Culture results may not be optimal due to an excessive volume of blood received in culture bottles  Culture  Setup Time   Final    GRAM NEGATIVE RODS AEROBIC BOTTLE ONLY CRITICAL RESULT CALLED TO, READ BACK BY AND VERIFIED WITH: Konrad Saha AT 9798 11/12/2018 BY L BENFIELD Performed at [facility], [place] [address] [place], [place] [address]    Culture (A)  Final    KLEBSIELLA PNEUMONIAE CONFIRMED CARBAPENEMASE RESISTANT ENTEROBACTERIACAE    Report Status 11/14/2018 FINAL  Final   Organism ID, Bacteria KLEBSIELLA PNEUMONIAE  Final      Susceptibility   Klebsiella pneumoniae - MIC*    AMPICILLIN >=32 RESISTANT Resistant     CEFAZOLIN >=64 RESISTANT Resistant     CEFEPIME 16 INTERMEDIATE Intermediate     CEFTAZIDIME >=64 RESISTANT Resistant     CEFTRIAXONE >=64 RESISTANT Resistant     CIPROFLOXACIN >=4 RESISTANT Resistant     GENTAMICIN 8 INTERMEDIATE Intermediate     IMIPENEM 8 INTERMEDIATE Intermediate     TRIMETH/SULFA >=320 RESISTANT Resistant     AMPICILLIN/SULBACTAM >=32 RESISTANT Resistant     PIP/TAZO >=128 RESISTANT Resistant     Extended ESBL NEGATIVE Sensitive     * KLEBSIELLA PNEUMONIAE  Blood Culture ID Panel (Reflexed)     Status: Abnormal   Collection Time: 11/11/18 10:37 PM  Result Value Ref Range Status   Enterococcus species NOT DETECTED NOT DETECTED Final   Listeria monocytogenes NOT DETECTED NOT DETECTED Final   Staphylococcus species NOT DETECTED NOT DETECTED Final   Staphylococcus aureus (BCID) NOT DETECTED NOT DETECTED Final   Streptococcus species NOT DETECTED NOT DETECTED Final   Streptococcus agalactiae NOT DETECTED NOT DETECTED Final   Streptococcus  pneumoniae NOT DETECTED NOT DETECTED Final   Streptococcus pyogenes NOT DETECTED NOT DETECTED Final   Acinetobacter baumannii NOT DETECTED NOT DETECTED Final   Enterobacteriaceae species DETECTED (A) NOT DETECTED Final    Comment: Enterobacteriaceae represent a large family of gram-negative bacteria, not a single organism. CRITICAL RESULT CALLED TO, READ BACK BY AND VERIFIED WITH: M BITONTI,PHARMD AT 4174 11/12/2018 BY L BENFIELD    Enterobacter cloacae complex NOT DETECTED NOT DETECTED Final   Escherichia coli NOT DETECTED NOT DETECTED Final   Klebsiella oxytoca NOT DETECTED NOT DETECTED Final   Klebsiella pneumoniae DETECTED (A) NOT DETECTED Final    Comment: CRITICAL RESULT CALLED TO, READ BACK BY AND VERIFIED WITH: M BITONTI,PHARMD AT 1938 11/12/2018 BY L BENFIELD    Proteus species NOT DETECTED NOT DETECTED Final   Serratia marcescens NOT DETECTED NOT DETECTED Final   Carbapenem resistance DETECTED (A) NOT DETECTED Final    Comment: CRITICAL RESULT CALLED TO, READ BACK BY AND VERIFIED WITH: M BITONTI,PHARMD AT 1938 11/12/2018 BY L BENFIELD    Haemophilus influenzae NOT DETECTED NOT DETECTED Final   Neisseria meningitidis NOT DETECTED NOT DETECTED Final   Pseudomonas aeruginosa NOT DETECTED NOT DETECTED Final   Candida albicans NOT DETECTED NOT DETECTED Final   Candida glabrata NOT DETECTED NOT DETECTED Final   Candida krusei NOT DETECTED NOT DETECTED Final   Candida parapsilosis NOT DETECTED NOT DETECTED Final   Candida tropicalis NOT DETECTED NOT DETECTED Final    Comment: Performed at [facility], [place] [address] [place], [place] [address]  Blood Culture (routine x 2)     Status: None   Collection Time: 11/11/18 10:39 PM   Specimen: BLOOD RIGHT HAND  Result Value Ref Range Status   Specimen Description BLOOD RIGHT HAND  Final   Special Requests   Final    BOTTLES DRAWN AEROBIC ONLY  Blood Culture adequate volume   Culture   Final    NO GROWTH 5 DAYS Performed at [facility], [place] [address] [place], [place] [address]    Report Status 11/16/2018 FINAL  Final  C difficile quick scan w PCR reflex     Status: None   Collection Time: 11/12/18  2:09 AM   Specimen: STOOL  Result Value Ref Range Status   C Diff antigen NEGATIVE NEGATIVE Final   C Diff toxin NEGATIVE NEGATIVE Final   C Diff interpretation No C. difficile detected.  Final    Comment: Performed at [facility], [place] [address], [place], [place] [address]  Culture, respiratory (tracheal aspirate)     Status: None   Collection Time: 11/12/18  3:47 AM   Specimen: Tracheal Aspirate; Respiratory  Result Value Ref Range Status   Specimen Description TRACHEAL ASPIRATE  Final   Special Requests NONE  Final   Gram Stain   Final    FEW WBC PRESENT, PREDOMINANTLY PMN MODERATE GRAM NEGATIVE RODS FEW GRAM POSITIVE COCCI IN PAIRS RARE GRAM POSITIVE RODS    Culture   Final    MODERATE ACINETOBACTER CALCOACETICUS/BAUMANNII COMPLEX MODERATE STENOTROPHOMONAS MALTOPHILIA MULTI-DRUG RESISTANT ORGANISM CRITICAL RESULT CALLED TO, READ BACK BY AND VERIFIED WITH: PHARMD [place] [id number] AT 945 AM BY CM Performed at [facility], [place] [address] [place], [place] [address]    Report Status 11/16/2018 FINAL  Final   Organism ID, Bacteria STENOTROPHOMONAS MALTOPHILIA  Final   Organism ID, Bacteria ACINETOBACTER CALCOACETICUS/BAUMANNII COMPLEX  Final      Susceptibility   Acinetobacter calcoaceticus/baumannii complex - MIC*    CEFTAZIDIME >=64 RESISTANT Resistant     CEFTRIAXONE >=64 RESISTANT Resistant     CIPROFLOXACIN >=4 RESISTANT Resistant     GENTAMICIN 8 INTERMEDIATE Intermediate     IMIPENEM >=16 RESISTANT Resistant     PIP/TAZO >=128 RESISTANT Resistant     TRIMETH/SULFA 160 RESISTANT Resistant     CEFEPIME >=64 RESISTANT Resistant     AMPICILLIN/SULBACTAM 4 SENSITIVE Sensitive     * MODERATE ACINETOBACTER CALCOACETICUS/BAUMANNII COMPLEX   Stenotrophomonas maltophilia - MIC*     LEVOFLOXACIN 1 SENSITIVE Sensitive     TRIMETH/SULFA 40 SENSITIVE Sensitive     * MODERATE STENOTROPHOMONAS MALTOPHILIA  MRSA PCR Screening     Status: None   Collection Time: 11/14/18  9:19 AM   Specimen: Nasal Mucosa; Nasopharyngeal  Result Value Ref Range Status   MRSA by PCR NEGATIVE NEGATIVE Final    Comment:        The GeneXpert MRSA Assay (FDA approved for NASAL specimens only), is one component of a comprehensive MRSA colonization surveillance program. It is not intended to diagnose MRSA infection nor to guide or monitor treatment for MRSA infections. Performed at [facility], [place] [address] [place],  [address]       Radiology Studies: Dg Chest Port 1 View  Result Date: 11/19/2018 CLINICAL DATA:  Fever and chills. EXAM: PORTABLE CHEST 1 VIEW COMPARISON:  November 11, 2018 FINDINGS: Tracheostomy tube in stable position. Cardiomediastinal silhouette is normal. Mediastinal contours appear intact. Calcific atherosclerotic disease of the aorta. Mild peribronchial airspace opacities in bilateral lower lobes, left greater than right. Osseous structures are without acute abnormality. Soft tissues are grossly normal. IMPRESSION: Mild peribronchial airspace opacities in bilateral lower lobes, left greater than right. Nonspecific findings which may represent atelectasis or multifocal airspace disease. Electronically Signed   By: Fidela Salisbury  M.D.   On: 11/19/2018 15:22        Scheduled Meds:  aspirin  81 mg Per Tube Daily   chlorhexidine gluconate (MEDLINE KIT)  15 mL Mouth Rinse BID   Chlorhexidine Gluconate Cloth  6 each Topical Daily   free water  300 mL Per Tube Q8H   insulin aspart  0-9 Units Subcutaneous Q4H   mouth rinse  15 mL Mouth Rinse 10 times per day   metoprolol tartrate  12.5 mg Per Tube BID   midodrine  5 mg Per Tube TID WC   multivitamin  15 mL Per Tube Daily   nutrition supplement (JUVEN)  1 packet Per Tube BID   pantoprazole  sodium  40 mg Per Tube Daily   phosphorus  500 mg Per Tube BID   zinc sulfate  220 mg Per Tube Daily   Continuous Infusions:  sodium chloride Stopped (11/19/18 1712)   ceftazidime avibactam (AVYCAZ) IVPB 25 mL/hr at 11/19/18 1800   feeding supplement (PIVOT 1.5 CAL) 1,000 mL (11/19/18 0230)   levETIRAcetam Stopped (11/19/18 1734)   norepinephrine (LEVOPHED) Adult infusion Stopped (11/14/18 0057)   phenytoin (DILANTIN) IV Stopped (11/19/18 1713)   valproate sodium 125 mg (11/19/18 1816)    Assessment & Plan:    1. Gram -ve Bacteremia/UTI/spesis with septic shock: POA. Had indwelling foley catheter on arrival that has been removed. Now on external catheter and able to void. Blood cx greq Klebsiella, urine cx grew Klebsiella and proteus. ID following and recommend 7 days of antibiotics through 7/13. Patient has leucocytosis on labs today (12K) with temp 101.2 in last 24 hours. Has diarrhea but recent C.diff -ve. Will obtain CXR to r/o aspiration pneumonia /fluid overload as patient did have upper airway sounds yesterday. ID to f/u and recommended regarding abx course. Send blood cx  2. Chronic systolic CHF: EF 14-78%. Compensated currently.   3. Paroxysmal A. fib: Continue low-dose metoprolol    Not on anticoagulation.    4. Acute resp distress with Chronic respiratory failure: Vent dependent, on trach at baseline.  PCCM following and managing Vent. CXR as above  5. Hypernatremia: Started on D5W on 7/9 and increased free water(38m q8). Sodium slowly improving and now at 152  6. History of coronary disease: On aspirin/metoprolol   7. Hypokalemia/Hypophosphatemia :Supplementing PRN.  8.Elevated liver enzymes: Thought to be secondary to shock liver.   9. History of seizure disorder: On IV antiepileptic drugs.  Currently on Dilantin, Keppra, valproic acid  10.  COPD: Currently stable.  Continue bronchodilators as needed.  11.  Acute on chronic anemia: likely Due to  chronic disease/dilutional.Hgb trending down 12.5>10.1>9.3->8.6->7.7   Continue to monitor.  No signs of obvious bleeding as d/w bedside nurse. H&h in am  12: Pressure ulcers: Supportive management  13. Goals of care: Palliative care following closely. Remains full code.     DVT prophylaxis: Hold Lovenox given drop in hgb  Code Status: Full code Family / Patient Communication: None at bedside. Has a legal guardian Disposition Plan: Back to [facility] in next 48 hours after abx course if remains afebrile and hgb stable  Addendum: CXR suggestive of fluid overload. I/0s show net positive but per nurse patient had issues with purewick yesterday, also have fluid losses in diarrhea. Irrespectively appears volume overloaded with 3rd spacing, hold IV fluids, will give lasix x1 if BP tolerates after midodrine. Will update daughter RSuanne Marker ([phone] D/W PCCM, Dr MVaughan Brownerwho reviewed X ray and agreed with plan.  Addendum 7 pm: d/w daughter [name]) and explained overall poor prognosis. CXR official report :patch infiltrates. On zosyn to cover aspiration. Holding IV fluids as described above. If hypotension worsens overnight , she will need pressors and back to ICU service. I advised daughter to reconsider code status and she seemed understanding but was not ready to make DNR decision yet. She is open to discussing further with palliative care team. Endorsed to on call APP to update daughter regarding any overnight acute issues.    LOS: 7 days    Time spent: 35 minutes    Guilford Shi, MD [facility] Pager [phone]  If 7PM-7AM, please contact night-coverage [email or web address] Password [facility] 11/19/2018, 6:49 PM

## 2018-11-19 NOTE — Progress Notes (Addendum)
PROGRESS NOTE    Melissa Cochran  OVF:[id number]  DOB: 1935/08/02  DOA: 11/11/2018 PCP: [name], MD  Brief Narrative:   83 year old female with history of chronic hypoxic respiratory failure on ventilator through trach collar, PEG, coronary artery  disease, heart failure with reduced ejection fraction of 45 to 50%, proximal A. fib, adnexal mass, COPD, CVA with chronic encephalopathy, seizure disorder, DVT, GERD, hypertension, recurrent UTI who presented from [facility] with acute on chronic encephalopathy and hypotension.  Patient's hospital course was remarkable for septic shock secondary to gram-negative bacteremia, source being urine.  She had to be put on pressure support for septic shock.  Currently off pressors and hemodynamically stable.  ID was following.  Plan is to complete IV antibiotic course before discharge to [facility]   Subjective:  Patient non responsive, non verbal. Appears tachypneic today. On trach,PEG,  external urinary catheter/rectal pouch with very loose BM. SBP fluctuating 80-120 and spiked temp of 101.2 yesterday. Not on any laxatives. Recent Cdiff -ve on 7/5  Objective: Vitals:   11/19/18 1130 11/19/18 1134 11/19/18 1135 11/19/18 1140  BP: (!) 83/44   (!) 96/52  Pulse: (!) 110   (!) 111  Resp: (!) 31   (!) 34  Temp:   98.4 F (36.9 C)   TempSrc:   Oral   SpO2: 100% 100%  100%  Weight:      Height:        Intake/Output Summary (Last 24 hours) at 11/19/2018 1326 Last data filed at 11/19/2018 1100 Gross per 24 hour  Intake 4023.77 ml  Output 950 ml  Net 3073.77 ml   Filed Weights   11/17/18 0240 11/18/18 0500 11/19/18 0500  Weight: 71.3 kg 73.3 kg 74.1 kg    Physical Examination:  General exam: Unresponsive, non verbal, s/p trach  Respiratory system: Tachypneic, coarse crepts bilaterally. Some upper airways sounds Cardiovascular system: S1 & S2 heard, RRR. No JVD, murmurs, rubs, gallops or clicks. 1+pitting leg edema Gastrointestinal  system: Abdomen is nondistended, soft and nontender. S/p PEG Central nervous system: non verbal, does not follow commands Extremities: Upper &lower extremity edema Skin: No rashes, has pressure ulcers on the lumbar region, sacral region, bilateral lower extremities. Psychiatry: . Mood & affect  Cannot be assessed.     Data Reviewed: I have personally reviewed following labs and imaging studies  CBC: Recent Labs  Lab 11/12/18 1614 11/13/18 0805 11/15/18 0536 11/17/18 0402 11/19/18 0430  WBC 20.3* 14.3* 7.8 9.6 12.4*  NEUTROABS  --  12.2* 6.2  --   --   HGB 10.1* 9.3* 9.2* 8.6* 7.7*  HCT 33.1* 30.0* 31.4* 30.7* 27.6*  MCV 93.0 92.3 96.6 100.7* 102.6*  PLT 298 264 180 232 841   Basic Metabolic Panel: Recent Labs  Lab 11/13/18 0805 11/13/18 1624 11/14/18 0556 11/14/18 1629 11/15/18 0536 11/16/18 0459 11/17/18 0402 11/18/18 0435 11/19/18 0430  NA 140  --  143  --  154* 160* 158* 155* 152*  K 3.8  --  2.9*  --  3.0* 4.1 2.9* 3.2* 3.9  CL 105  --  108  --  126* >130* 129* 126* 123*  CO2 19*  --  18*  --  16* 18* 19* 19* 21*  GLUCOSE 139*  --  320*  --  192* 147* 127* 136* 118*  BUN 67*  --  73*  --  80* 81* 79* 74* 64*  CREATININE 1.29*  --  1.31*  --  0.95 0.65 0.68 0.61  0.53  0.55  CALCIUM 8.7*  --  8.8*  --  8.7* 9.2 8.3* 8.4* 8.5*  MG 2.2 2.4 2.6* 2.5*  --   --   --   --   --   PHOS 3.4 3.2 2.5 1.8*  --  <1.0* 2.5  --   --    GFR: Estimated Creatinine Clearance: 58 mL/min (by C-G formula based on SCr of 0.55 mg/dL). Liver Function Tests: Recent Labs  Lab 11/12/18 1614 11/13/18 0805 11/15/18 0536 11/16/18 1440 11/17/18 0402  AST 382* 298* 332* 118*  --   ALT 340* 350* 646* 424*  --   ALKPHOS 110 103 120 144*  --   BILITOT 1.4* 1.6* 1.0 1.2  --   PROT 5.3* 5.8* 5.8* 5.7*  --   ALBUMIN 1.6* 1.7* 1.8* 1.8* 1.8*   No results for input(s): LIPASE, AMYLASE in the last 168 hours. No results for input(s): AMMONIA in the last 168 hours. Coagulation Profile: No  results for input(s): INR, PROTIME in the last 168 hours. Cardiac Enzymes: No results for input(s): CKTOTAL, CKMB, CKMBINDEX, TROPONINI in the last 168 hours. BNP (last 3 results) No results for input(s): PROBNP in the last 8760 hours. HbA1C: No results for input(s): HGBA1C in the last 72 hours. CBG: Recent Labs  Lab 11/18/18 2034 11/19/18 0027 11/19/18 0433 11/19/18 0754 11/19/18 1138  GLUCAP 118* 104* 112* 116* 138*   Lipid Profile: No results for input(s): CHOL, HDL, LDLCALC, TRIG, CHOLHDL, LDLDIRECT in the last 72 hours. Thyroid Function Tests: No results for input(s): TSH, T4TOTAL, FREET4, T3FREE, THYROIDAB in the last 72 hours. Anemia Panel: No results for input(s): VITAMINB12, FOLATE, FERRITIN, TIBC, IRON, RETICCTPCT in the last 72 hours. Sepsis Labs: Recent Labs  Lab 11/12/18 1407 11/13/18 0805  LATICACIDVEN 4.5* 3.1*    Recent Results (from the past 240 hour(s))  Carbapenem Resistance Panel     Status: Abnormal   Collection Time: 11/11/18  5:18 PM  Result Value Ref Range Status   Carba Resistance IMP Gene NOT DETECTED NOT DETECTED Final   Carba Resistance VIM Gene NOT DETECTED NOT DETECTED Final   Carba Resistance NDM Gene NOT DETECTED NOT DETECTED Final   Carba Resistance KPC Gene DETECTED (A) NOT DETECTED Final   Carba Resistance OXA48 Gene NOT DETECTED NOT DETECTED Final    Comment: (NOTE) Cepheid Carba-R is an FDA-cleared nucleic acid amplification test  (NAAT)for the detection and differentiation of genes encoding the  most prevalent carbapenemases in bacterial isolate samples. Carbapenemase gene identification and implementation of comprehensive  infection control measures are recommended by the CDC to prevent the  spread of the resistant organisms. Performed at [facility], [place] [address] [place], [place] [address]   SARS Coronavirus 2 (CEPHEID- Performed in [facility] lab), Hosp Order     Status: None   Collection Time: 11/11/18  10:04 PM   Specimen: Nasopharyngeal Swab  Result Value Ref Range Status   SARS Coronavirus 2 NEGATIVE NEGATIVE Final    Comment: (NOTE) If result is NEGATIVE SARS-CoV-2 target nucleic acids are NOT DETECTED. The SARS-CoV-2 RNA is generally detectable in upper and lower  respiratory specimens during the acute phase of infection. The lowest  concentration of SARS-CoV-2 viral copies this assay can detect is 250  copies / mL. A negative result does not preclude SARS-CoV-2 infection  and should not be used as the sole basis for treatment or other  patient management decisions.  A negative result may occur with  improper  specimen collection / handling, submission of specimen other  than nasopharyngeal swab, presence of viral mutation(s) within the  areas targeted by this assay, and inadequate number of viral copies  (<250 copies / mL). A negative result must be combined with clinical  observations, patient history, and epidemiological information. If result is POSITIVE SARS-CoV-2 target nucleic acids are DETECTED. The SARS-CoV-2 RNA is generally detectable in upper and lower  respiratory specimens dur ing the acute phase of infection.  Positive  results are indicative of active infection with SARS-CoV-2.  Clinical  correlation with patient history and other diagnostic information is  necessary to determine patient infection status.  Positive results do  not rule out bacterial infection or co-infection with other viruses. If result is PRESUMPTIVE POSTIVE SARS-CoV-2 nucleic acids MAY BE PRESENT.   A presumptive positive result was obtained on the submitted specimen  and confirmed on repeat testing.  While 2019 novel coronavirus  (SARS-CoV-2) nucleic acids may be present in the submitted sample  additional confirmatory testing may be necessary for epidemiological  and / or clinical management purposes  to differentiate between  SARS-CoV-2 and other Sarbecovirus currently known to infect  humans.  If clinically indicated additional testing with an alternate test  methodology [phone]) is advised. The SARS-CoV-2 RNA is generally  detectable in upper and lower respiratory sp ecimens during the acute  phase of infection. The expected result is Negative. Fact Sheet for Patients:  StrictlyIdeas.no Fact Sheet for Healthcare Providers: BankingDealers.co.za This test is not yet approved or cleared by the [place] FDA and has been authorized for detection and/or diagnosis of SARS-CoV-2 by FDA under an Emergency Use Authorization (EUA).  This EUA will remain in effect (meaning this test can be used) for the duration of the COVID-19 declaration under Section 564(b)(1) of the Act, 21 U.S.C. section 360bbb-3(b)(1), unless the authorization is terminated or revoked sooner. Performed at [facility], [place] [address], [place], [place] [address]   Urine culture     Status: Abnormal   Collection Time: 11/11/18 10:17 PM   Specimen: Urine, Catheterized  Result Value Ref Range Status   Specimen Description URINE, CATHETERIZED  Final   Special Requests NONE  Final   Culture (A)  Final    >=100,000 COLONIES/mL PROTEUS MIRABILIS 70,000 COLONIES/mL KLEBSIELLA PNEUMONIAE PREVIOUS CRC POSITIVE IN BLOOD CULTURE Performed at [facility], [place] [address] [place], [place] [address]    Report Status 11/15/2018 FINAL  Final   Organism ID, Bacteria PROTEUS MIRABILIS (A)  Final   Organism ID, Bacteria KLEBSIELLA PNEUMONIAE (A)  Final      Susceptibility   Proteus mirabilis - MIC*    AMPICILLIN >=32 RESISTANT Resistant     CEFAZOLIN >=64 RESISTANT Resistant     CEFTRIAXONE >=64 RESISTANT Resistant     CIPROFLOXACIN >=4 RESISTANT Resistant     GENTAMICIN <=1 SENSITIVE Sensitive     IMIPENEM 2 SENSITIVE Sensitive     NITROFURANTOIN 128 RESISTANT Resistant     TRIMETH/SULFA <=20 SENSITIVE Sensitive     AMPICILLIN/SULBACTAM 8  SENSITIVE Sensitive     PIP/TAZO <=4 SENSITIVE Sensitive     * >=100,000 COLONIES/mL PROTEUS MIRABILIS  Blood Culture (routine x 2)     Status: Abnormal   Collection Time: 11/11/18 10:37 PM   Specimen: BLOOD RIGHT ARM  Result Value Ref Range Status   Specimen Description BLOOD RIGHT ARM  Final   Special Requests   Final    BOTTLES DRAWN AEROBIC AND ANAEROBIC  Blood Culture results may not be optimal due to an excessive volume of blood received in culture bottles   Culture  Setup Time   Final    GRAM NEGATIVE RODS AEROBIC BOTTLE ONLY CRITICAL RESULT CALLED TO, READ BACK BY AND VERIFIED WITH: Konrad Saha AT 9563 11/12/2018 BY L BENFIELD Performed at [facility], [place] [address] [place], [place] [address]    Culture (A)  Final    KLEBSIELLA PNEUMONIAE CONFIRMED CARBAPENEMASE RESISTANT ENTEROBACTERIACAE    Report Status 11/14/2018 FINAL  Final   Organism ID, Bacteria KLEBSIELLA PNEUMONIAE  Final      Susceptibility   Klebsiella pneumoniae - MIC*    AMPICILLIN >=32 RESISTANT Resistant     CEFAZOLIN >=64 RESISTANT Resistant     CEFEPIME 16 INTERMEDIATE Intermediate     CEFTAZIDIME >=64 RESISTANT Resistant     CEFTRIAXONE >=64 RESISTANT Resistant     CIPROFLOXACIN >=4 RESISTANT Resistant     GENTAMICIN 8 INTERMEDIATE Intermediate     IMIPENEM 8 INTERMEDIATE Intermediate     TRIMETH/SULFA >=320 RESISTANT Resistant     AMPICILLIN/SULBACTAM >=32 RESISTANT Resistant     PIP/TAZO >=128 RESISTANT Resistant     Extended ESBL NEGATIVE Sensitive     * KLEBSIELLA PNEUMONIAE  Blood Culture ID Panel (Reflexed)     Status: Abnormal   Collection Time: 11/11/18 10:37 PM  Result Value Ref Range Status   Enterococcus species NOT DETECTED NOT DETECTED Final   Listeria monocytogenes NOT DETECTED NOT DETECTED Final   Staphylococcus species NOT DETECTED NOT DETECTED Final   Staphylococcus aureus (BCID) NOT DETECTED NOT DETECTED Final   Streptococcus species NOT DETECTED NOT DETECTED Final    Streptococcus agalactiae NOT DETECTED NOT DETECTED Final   Streptococcus pneumoniae NOT DETECTED NOT DETECTED Final   Streptococcus pyogenes NOT DETECTED NOT DETECTED Final   Acinetobacter baumannii NOT DETECTED NOT DETECTED Final   Enterobacteriaceae species DETECTED (A) NOT DETECTED Final    Comment: Enterobacteriaceae represent a large family of gram-negative bacteria, not a single organism. CRITICAL RESULT CALLED TO, READ BACK BY AND VERIFIED WITH: M BITONTI,PHARMD AT 3329 11/12/2018 BY L BENFIELD    Enterobacter cloacae complex NOT DETECTED NOT DETECTED Final   Escherichia coli NOT DETECTED NOT DETECTED Final   Klebsiella oxytoca NOT DETECTED NOT DETECTED Final   Klebsiella pneumoniae DETECTED (A) NOT DETECTED Final    Comment: CRITICAL RESULT CALLED TO, READ BACK BY AND VERIFIED WITH: M BITONTI,PHARMD AT 1938 11/12/2018 BY L BENFIELD    Proteus species NOT DETECTED NOT DETECTED Final   Serratia marcescens NOT DETECTED NOT DETECTED Final   Carbapenem resistance DETECTED (A) NOT DETECTED Final    Comment: CRITICAL RESULT CALLED TO, READ BACK BY AND VERIFIED WITH: M BITONTI,PHARMD AT 1938 11/12/2018 BY L BENFIELD    Haemophilus influenzae NOT DETECTED NOT DETECTED Final   Neisseria meningitidis NOT DETECTED NOT DETECTED Final   Pseudomonas aeruginosa NOT DETECTED NOT DETECTED Final   Candida albicans NOT DETECTED NOT DETECTED Final   Candida glabrata NOT DETECTED NOT DETECTED Final   Candida krusei NOT DETECTED NOT DETECTED Final   Candida parapsilosis NOT DETECTED NOT DETECTED Final   Candida tropicalis NOT DETECTED NOT DETECTED Final    Comment: Performed at [facility], [place] [address] [place], [place] [address]  Blood Culture (routine x 2)     Status: None   Collection Time: 11/11/18 10:39 PM   Specimen: BLOOD RIGHT HAND  Result Value Ref Range Status   Specimen  Description BLOOD RIGHT HAND  Final   Special Requests   Final    BOTTLES DRAWN AEROBIC ONLY Blood Culture  adequate volume   Culture   Final    NO GROWTH 5 DAYS Performed at [facility], [address] [place], [place] [address]    Report Status 11/16/2018 FINAL  Final  C difficile quick scan w PCR reflex     Status: None   Collection Time: 11/12/18  2:09 AM   Specimen: STOOL  Result Value Ref Range Status   C Diff antigen NEGATIVE NEGATIVE Final   C Diff toxin NEGATIVE NEGATIVE Final   C Diff interpretation No C. difficile detected.  Final    Comment: Performed at [facility], [place] [address] [place], [place] [address]  Culture, respiratory (tracheal aspirate)     Status: None   Collection Time: 11/12/18  3:47 AM   Specimen: Tracheal Aspirate; Respiratory  Result Value Ref Range Status   Specimen Description TRACHEAL ASPIRATE  Final   Special Requests NONE  Final   Gram Stain   Final    FEW WBC PRESENT, PREDOMINANTLY PMN MODERATE GRAM NEGATIVE RODS FEW GRAM POSITIVE COCCI IN PAIRS RARE GRAM POSITIVE RODS    Culture   Final    MODERATE ACINETOBACTER CALCOACETICUS/BAUMANNII COMPLEX MODERATE STENOTROPHOMONAS MALTOPHILIA MULTI-DRUG RESISTANT ORGANISM CRITICAL RESULT CALLED TO, READ BACK BY AND VERIFIED WITH: PHARMD [place]ab [id number] AT 945 AM BY CM Performed at [facility], [place] [address] [place], [place] [address]    Report Status 11/16/2018 FINAL  Final   Organism ID, Bacteria STENOTROPHOMONAS MALTOPHILIA  Final   Organism ID, Bacteria ACINETOBACTER CALCOACETICUS/BAUMANNII COMPLEX  Final      Susceptibility   Acinetobacter calcoaceticus/baumannii complex - MIC*    CEFTAZIDIME >=64 RESISTANT Resistant     CEFTRIAXONE >=64 RESISTANT Resistant     CIPROFLOXACIN >=4 RESISTANT Resistant     GENTAMICIN 8 INTERMEDIATE Intermediate     IMIPENEM >=16 RESISTANT Resistant     PIP/TAZO >=128 RESISTANT Resistant     TRIMETH/SULFA 160 RESISTANT Resistant     CEFEPIME >=64 RESISTANT Resistant     AMPICILLIN/SULBACTAM 4 SENSITIVE Sensitive     * MODERATE ACINETOBACTER  CALCOACETICUS/BAUMANNII COMPLEX   Stenotrophomonas maltophilia - MIC*    LEVOFLOXACIN 1 SENSITIVE Sensitive     TRIMETH/SULFA 40 SENSITIVE Sensitive     * MODERATE STENOTROPHOMONAS MALTOPHILIA  MRSA PCR Screening     Status: None   Collection Time: 11/14/18  9:19 AM   Specimen: Nasal Mucosa; Nasopharyngeal  Result Value Ref Range Status   MRSA by PCR NEGATIVE NEGATIVE Final    Comment:        The GeneXpert MRSA Assay (FDA approved for NASAL specimens only), is one component of a comprehensive MRSA colonization surveillance program. It is not intended to diagnose MRSA infection nor to guide or monitor treatment for MRSA infections. Performed at [facility], [place] [address] [place], [place] [address]       Radiology Studies: No results found.      Scheduled Meds: . aspirin  81 mg Per Tube Daily  . chlorhexidine gluconate (MEDLINE KIT)  15 mL Mouth Rinse BID  . Chlorhexidine Gluconate Cloth  6 each Topical Daily  . enoxaparin (LOVENOX) injection  40 mg Subcutaneous Q24H  . free water  300 mL Per Tube Q8H  . insulin aspart  0-9 Units Subcutaneous Q4H  . mouth rinse  15 mL Mouth Rinse 10  times per day  . metoprolol tartrate  12.5 mg Per Tube BID  . midodrine  5 mg Per Tube TID WC  . multivitamin  15 mL Per Tube Daily  . nutrition supplement (JUVEN)  1 packet Per Tube BID  . pantoprazole sodium  40 mg Per Tube Daily  . phosphorus  500 mg Per Tube BID  . zinc sulfate  220 mg Per Tube Daily   Continuous Infusions: . sodium chloride 10 mL/hr at 11/19/18 1100  . ceftazidime avibactam (AVYCAZ) IVPB 1.25 g (11/19/18 1108)  . dextrose 75 mL/hr at 11/19/18 1100  . feeding supplement (PIVOT 1.5 CAL) 1,000 mL (11/19/18 0230)  . levETIRAcetam Stopped (11/19/18 0753)  . norepinephrine (LEVOPHED) Adult infusion Stopped (11/14/18 0057)  . phenytoin (DILANTIN) IV Stopped (11/19/18 1057)  . valproate sodium 125 mg (11/19/18 1109)    Assessment & Plan:    1. Gram -ve  Bacteremia/UTI/spesis with septic shock: POA. Had indwelling foley catheter on arrival that has been removed. Now on external catheter and able to void. Blood cx greq Klebsiella, urine cx grew Klebsiella and proteus. ID following and recommend 7 days of antibiotics through 7/13. Patient has leucocytosis on labs today (12K) with temp 101.2 in last 24 hours. Has diarrhea but recent C.diff -ve. Will obtain CXR to r/o aspiration pneumonia /fluid overload as patient did have upper airway sounds yesterday. ID to f/u and recommended regarding abx course. Send blood cx  2. Chronic systolic CHF: EF 38-75%. Compensated currently.   3. Paroxysmal A. fib: Continue low-dose metoprolol    Not on anticoagulation.    4. Acute resp distress with Chronic respiratory failure: Vent dependent, on trach at baseline.  PCCM following and managing Vent. CXR as above  5. Hypernatremia: Started on D5W on 7/9 and increased free water(334m q8). Sodium slowly improving and now at 152  6. History of coronary disease: On aspirin/metoprolol   7. Hypokalemia/Hypophosphatemia :Supplementing PRN.  8.Elevated liver enzymes: Thought to be secondary to shock liver.   9. History of seizure disorder: On IV antiepileptic drugs.  Currently on Dilantin, Keppra, valproic acid  10.  COPD: Currently stable.  Continue bronchodilators as needed.  11.  Acute on chronic anemia: likely Due to chronic disease/dilutional.Hgb trending down 12.5>10.1>9.3->8.6->7.7   Continue to monitor.  No signs of obvious bleeding as d/w bedside nurse. H&h in am  12: Pressure ulcers: Supportive management  13. Goals of care: Palliative care following closely. Remains full code.     DVT prophylaxis: Hold Lovenox given drop in hgb  Code Status: Full code Family / Patient Communication: None at bedside. Has a legal guardian Disposition Plan: Back to [facility] in next 48 hours after abx course if remains afebrile and hgb stable  Addendum: CXR  suggestive of fluid overload. I/0s show net positive but per nurse patient had issues with purewick yesterday, also have fluid losses in diarrhea. Irrespectively appears volume overloaded with 3rd spacing, hold IV fluids, will give lasix x1 if BP tolerates after midodrine. Will update daughter RSuanne Marker ([phone] D/W PCCM, Dr MVaughan Brownerwho reviewed X ray and agreed with plan.    LOS: 7 days    Time spent: 35 minutes    NGuilford Shi MD [facility] Pager [phone] If 7PM-7AM, please contact night-coverage [email or web address] Password TThe [facility]7/04/2019, 1:26 PM

## 2018-11-20 DIAGNOSIS — R509 Fever, unspecified: Secondary | ICD-10-CM

## 2018-11-20 DIAGNOSIS — D649 Anemia, unspecified: Secondary | ICD-10-CM

## 2018-11-20 DIAGNOSIS — R197 Diarrhea, unspecified: Secondary | ICD-10-CM

## 2018-11-20 DIAGNOSIS — I959 Hypotension, unspecified: Secondary | ICD-10-CM

## 2018-11-20 DIAGNOSIS — L8990 Pressure ulcer of unspecified site, unspecified stage: Secondary | ICD-10-CM | POA: Diagnosis present

## 2018-11-20 LAB — CBC
HCT: 27 % — ABNORMAL LOW (ref 36.0–46.0)
Hemoglobin: 7.6 g/dL — ABNORMAL LOW (ref 12.0–15.0)
MCH: 29 pg (ref 26.0–34.0)
MCHC: 28.1 g/dL — ABNORMAL LOW (ref 30.0–36.0)
MCV: 103.1 fL — ABNORMAL HIGH (ref 80.0–100.0)
Platelets: 287 10*3/uL (ref 150–400)
RBC: 2.62 MIL/uL — ABNORMAL LOW (ref 3.87–5.11)
RDW: 18.5 % — ABNORMAL HIGH (ref 11.5–15.5)
WBC: 14.7 10*3/uL — ABNORMAL HIGH (ref 4.0–10.5)
nRBC: 0.3 % — ABNORMAL HIGH (ref 0.0–0.2)

## 2018-11-20 LAB — GLUCOSE, CAPILLARY
Glucose-Capillary: 105 mg/dL — ABNORMAL HIGH (ref 70–99)
Glucose-Capillary: 105 mg/dL — ABNORMAL HIGH (ref 70–99)
Glucose-Capillary: 108 mg/dL — ABNORMAL HIGH (ref 70–99)
Glucose-Capillary: 117 mg/dL — ABNORMAL HIGH (ref 70–99)
Glucose-Capillary: 124 mg/dL — ABNORMAL HIGH (ref 70–99)
Glucose-Capillary: 125 mg/dL — ABNORMAL HIGH (ref 70–99)
Glucose-Capillary: 136 mg/dL — ABNORMAL HIGH (ref 70–99)

## 2018-11-20 LAB — BASIC METABOLIC PANEL
Anion gap: 10 (ref 5–15)
BUN: 61 mg/dL — ABNORMAL HIGH (ref 8–23)
CO2: 21 mmol/L — ABNORMAL LOW (ref 22–32)
Calcium: 8.7 mg/dL — ABNORMAL LOW (ref 8.9–10.3)
Chloride: 121 mmol/L — ABNORMAL HIGH (ref 98–111)
Creatinine, Ser: 0.5 mg/dL (ref 0.44–1.00)
GFR calc Af Amer: >60 mL/min (ref >60)
GFR calc non Af Amer: >60 mL/min (ref >60)
Glucose, Bld: 117 mg/dL — ABNORMAL HIGH (ref 70–99)
Potassium: 4 mmol/L (ref 3.5–5.1)
Sodium: 152 mmol/L — ABNORMAL HIGH (ref 135–145)

## 2018-11-20 LAB — C DIFFICILE QUICK SCREEN W PCR REFLEX
C Diff antigen: NEGATIVE
C Diff interpretation: NOT DETECTED
C Diff toxin: NEGATIVE

## 2018-11-20 MED ORDER — TRAMADOL HCL 50 MG PO TABS
50.0000 mg | ORAL_TABLET | Freq: Four times a day (QID) | ORAL | Status: DC | PRN
  Filled 2018-11-20: qty 1

## 2018-11-20 MED ORDER — TRAMADOL HCL 50 MG PO TABS
50.0000 mg | ORAL_TABLET | Freq: Four times a day (QID) | ORAL | Status: DC | PRN
  Administered 2018-11-20 – 2018-11-28 (×3): 50 mg
  Filled 2018-11-20 (×3): qty 1

## 2018-11-20 MED ORDER — ACETAMINOPHEN 160 MG/5ML PO SOLN
1000.0000 mg | Freq: Three times a day (TID) | ORAL | Status: DC
  Administered 2018-11-20 – 2018-11-29 (×27): 1000 mg
  Filled 2018-11-20 (×27): qty 40.6

## 2018-11-20 MED ORDER — ACETAMINOPHEN 160 MG/5ML PO SOLN
650.0000 mg | Freq: Three times a day (TID) | ORAL | Status: DC
  Filled 2018-11-20: qty 20.3

## 2018-11-20 MED ORDER — ACETAMINOPHEN 160 MG/5ML PO SOLN: 650.0000 mg | Freq: Three times a day (TID) | ORAL | Status: DC

## 2018-11-20 NOTE — Progress Notes (Signed)
NAME:  Melissa Cochran, MRN:  [id number], DOB:  [birth year], LOS: 64 ADMISSION DATE:  11/11/2018, CONSULTATION DATE: 11/11/2018 REFERRING MD: ED, CHIEF COMPLAINT: Sepsis  Brief History   83 year old woman with history of chronic hypoxic respiratory failure with ventilator/PEG tube dependence, CAD, HFrEF (EF 45-50% 06/2017), paroxysmal atrial fibrillation, adnexal mass, COPD, CVA with chronic encephalopathy, seizure disorder, DVT, GERD, hypertension, recurrent UTI presenting from [facility] with acute on chronic encephalopathy and hypotension found to have UTI.  History of present illness   83 year old woman with history of chronic hypoxic respiratory failure with ventilator/PEG tube dependence, CAD, HFrEF (EF 45-50% 06/2017), paroxysmal atrial fibrillation, adnexal mass, COPD, CVA with chronic encephalopathy, seizure disorder, DVT, GERD, hypertension, recurrent UTI presenting from [facility] with acute on chronic encephalopathy and hypotension.  Per EMR her baseline level of consciousness is eyes open and tracking.  Per daughter, she was corresponding with the [facility] nurses.  There were concerns on day of admission for fever, tachycardia.  At one point, she was reported to appear gray.  There was also report of emesis.  Past Medical History  Chronic hypoxic respiratory failure with ventilator/PEG tube dependence, CAD, HFrEF (EF 45-50% 06/2017), paroxysmal atrial fibrillation, adnexal mass, COPD, CVA with chronic encephalopathy, seizure disorder, DVT, GERD, hypertension, recurrent UTI  Significant Hospital Events   7/5> admit  Consults:  7/5: Palliaitve: [place] 7/6: Infectious Disease: CRE   Procedures:  none  Significant Diagnostic Tests:  CT Chest/ab/pelvis 7/5-patchy tree-in-bud opacities, consolidation at the lung bases.  Mild mediastinal lymphadenopathy. Mild sigmoid diverticulitis, irregular liver surface, large left adnexal cystic mass.  Bilateral nephrolithiasis.  Chest x-ray 7/12-mild  peribronchial opacities bilaterally in the lower lobes.  I have reviewed the images personally.   Micro Data:  Blood cx 7/4> +Kleb pna, +enterbacteraceia, +CRE Urine cx 7/4> >100,000 colonies proteus mirabilis, 70,000 klebsiella Covid Negative  C diff negative  Resp Cx 7/5: Acinetobacter, stenotrophomonas.  Antimicrobials:  Vanc 7/4>7/5 Meropenem 7/4>7/5  Ceftaz-Avabactam 7/5 >>  Interim history/subjective:    Objective   Blood pressure (!) 98/51, pulse (!) 114, temperature 98.6 F (37 C), temperature source Oral, resp. rate (!) 25, height 5\' 7"  (1.702 m), weight 75.9 kg, SpO2 100 %.    Vent Mode: PCV FiO2 (%):  [30 %-40 %] 30 % Set Rate:  [20 bmp] 20 bmp PEEP:  [5 cmH20] 5 cmH20 Plateau Pressure:  [20 cmH20-28 cmH20] 25 cmH20   Intake/Output Summary (Last 24 hours) at 11/20/2018 0805 Last data filed at 11/19/2018 1815 Gross per 24 hour  Intake 1229.69 ml  Output 950 ml  Net 279.69 ml   UOP 535cc on 7/6  Filed Weights   11/18/18 0500 11/19/18 0500 11/20/18 0419  Weight: 73.3 kg 74.1 kg 75.9 kg    Physical Exam:  Gen:      No acute distress, chronically ill-appearing HEENT:  EOMI, sclera anicteric Neck:     No masses; no thyromegaly, trach Lungs:    Clear to auscultation bilaterally; normal respiratory effort CV:         Regular rate and rhythm; no murmurs Abd:      + bowel sounds; soft, non-tender; no palpable masses, no distension Ext:    No edema; adequate peripheral perfusion Skin:      Warm and dry; no rash Neuro: Unresponsive  Assessment & Plan:   Septic Shock from presumed urinary tract infection  Possible ischemic bowel related to shock and supply/demand ischemia  MDRO Bacteremia, CRE Klebsiella pneumonia  Multiorgan Failure secondary to above  Continue antibiotics ID on board  Acute on Chronic Respiratory Failure, vent dependent respiratory failure  - full vent support, wean as able to ps  - baseline vent dependent - Wean FiO2 to maintain SpO2  >90 COPD: Continue duo nebs Failing pressure support weans  Hypernatremia D5 stopped.  Continue free water  Transaminitis: improving Acute shock liver LFTs improving.  Continue monitoring for  Normocytic Anemia.  No evidence of active bleed. Hx IDA Follow CBC.  Transfuse for less than 7.  Seizure history: IV AEDs, dilantin, keppra, valpro   CAD, Prolonged qt:  Continue aspirin. Intermittent EKG.  Severe protein calorie malnutrition  Continue tube feeds  Goals of care Per palliative discussion with daughter, remains full code with full aggressive treatment  Best practice:  Diet: NG tube feeds Pain/Anxiety/Delirium protocol (if indicated): Fentanyl PRN. Add tramadol VAP protocol (if indicated): Ordered DVT prophylaxis: SubQ Lovenox GI prophylaxis: Protonix Glucose control: Monitor Mobility: PT ok from ccm standpoint Code Status: Full code Family Communication: per primary Disposition: On [employer] service. PCCM to follow for vent management.  Marshell Garfinkel MD [facility] 11/20/2018, 8:05 AM

## 2018-11-20 NOTE — Progress Notes (Signed)
PROGRESS NOTE    Melissa Cochran  ZOX:[id number]  DOB: 01-Sep-1935  DOA: 11/11/2018 PCP: [name], MD  Brief Narrative:   83 year old female with history of chronic hypoxic respiratory failure on ventilator through trach collar, PEG, coronary artery  disease, heart failure with reduced ejection fraction of 45 to 50%, proximal A. fib, adnexal mass, COPD, CVA with chronic encephalopathy, seizure disorder, DVT, GERD, hypertension, recurrent UTI who presented from [facility] with acute on chronic encephalopathy and hypotension.  Patient's hospital course was remarkable for septic shock secondary to gram-negative bacteremia, source being urine.  She had to be put on pressure support for septic shock.  Currently off pressors and hemodynamically stable.  ID was following.  Plan is to complete IV antibiotic course before discharge to [facility]   Subjective:  Patient continues to non responsive, non verbal.  Shuts eyes tight and grimaces. Less tachypneic today.  Been spiking temperatures over the weekend.  Still having loose BM. SBP fluctuating 80-120. Not on any laxatives. Recent Cdiff -ve on 7/5  Objective: Vitals:   11/20/18 1300 11/20/18 1400 11/20/18 1518 11/20/18 1529  BP: (!) 93/46 (!) 97/48 108/64   Pulse: (!) 110 96 99   Resp: (!) 25 18 (!) 26   Temp:    (!) 100.9 F (38.3 C)  TempSrc:      SpO2: 100% 100% 100%   Weight:      Height:        Intake/Output Summary (Last 24 hours) at 11/20/2018 1536 Last data filed at 11/20/2018 1200 Gross per 24 hour  Intake 1222.31 ml  Output 800 ml  Net 422.31 ml   Filed Weights   11/18/18 0500 11/19/18 0500 11/20/18 0419  Weight: 73.3 kg 74.1 kg 75.9 kg    Physical Examination:  General exam: Unresponsive, non verbal, s/p trach  Respiratory system: Reduced breath sounds at bases, overall clear and better today without any wheezing Cardiovascular system: S1 & S2 heard, RRR. No JVD, murmurs, rubs, gallops or clicks. 1+pitting leg  edema Gastrointestinal system: Abdomen appears full/ascites but not significantly distended.  S/p PEG Central nervous system: non verbal, does not follow commands Extremities: Upper &lower extremity edema Skin: No rashes, has pressure ulcers on the lumbar region, sacral region, bilateral lower extremities. Psychiatry: . Mood & affect  Cannot be assessed.     Data Reviewed: I have personally reviewed following labs and imaging studies  CBC: Recent Labs  Lab 11/15/18 0536 11/17/18 0402 11/19/18 0430 11/20/18 0404  WBC 7.8 9.6 12.4* 14.7*  NEUTROABS 6.2  --   --   --   HGB 9.2* 8.6* 7.7* 7.6*  HCT 31.4* 30.7* 27.6* 27.0*  MCV 96.6 100.7* 102.6* 103.1*  PLT 180 232 271 811   Basic Metabolic Panel: Recent Labs  Lab 11/13/18 1624 11/14/18 0556 11/14/18 1629  11/16/18 0459 11/17/18 0402 11/18/18 0435 11/19/18 0430 11/20/18 0404  NA  --  143  --    < > 160* 158* 155* 152* 152*  K  --  2.9*  --    < > 4.1 2.9* 3.2* 3.9 4.0  CL  --  108  --    < > >130* 129* 126* 123* 121*  CO2  --  18*  --    < > 18* 19* 19* 21* 21*  GLUCOSE  --  320*  --    < > 147* 127* 136* 118* 117*  BUN  --  73*  --    < > 81* 79*  74* 64* 61*  CREATININE  --  1.31*  --    < > 0.65 0.68 0.61  0.53 0.55 0.50  CALCIUM  --  8.8*  --    < > 9.2 8.3* 8.4* 8.5* 8.7*  MG 2.4 2.6* 2.5*  --   --   --   --   --   --   PHOS 3.2 2.5 1.8*  --  <1.0* 2.5  --   --   --    < > = values in this interval not displayed.   GFR: Estimated Creatinine Clearance: 58.6 mL/min (by C-G formula based on SCr of 0.5 mg/dL). Liver Function Tests: Recent Labs  Lab 11/15/18 0536 11/16/18 1440 11/17/18 0402  AST 332* 118*  --   ALT 646* 424*  --   ALKPHOS 120 144*  --   BILITOT 1.0 1.2  --   PROT 5.8* 5.7*  --   ALBUMIN 1.8* 1.8* 1.8*   No results for input(s): LIPASE, AMYLASE in the last 168 hours. No results for input(s): AMMONIA in the last 168 hours. Coagulation Profile: No results for input(s): INR, PROTIME in the  last 168 hours. Cardiac Enzymes: No results for input(s): CKTOTAL, CKMB, CKMBINDEX, TROPONINI in the last 168 hours. BNP (last 3 results) No results for input(s): PROBNP in the last 8760 hours. HbA1C: No results for input(s): HGBA1C in the last 72 hours. CBG: Recent Labs  Lab 11/20/18 0002 11/20/18 0400 11/20/18 0725 11/20/18 1055 11/20/18 1509  GLUCAP 124* 105* 108* 125* 105*   Lipid Profile: No results for input(s): CHOL, HDL, LDLCALC, TRIG, CHOLHDL, LDLDIRECT in the last 72 hours. Thyroid Function Tests: No results for input(s): TSH, T4TOTAL, FREET4, T3FREE, THYROIDAB in the last 72 hours. Anemia Panel: No results for input(s): VITAMINB12, FOLATE, FERRITIN, TIBC, IRON, RETICCTPCT in the last 72 hours. Sepsis Labs: No results for input(s): PROCALCITON, LATICACIDVEN in the last 168 hours.  Recent Results (from the past 240 hour(s))  Carbapenem Resistance Panel     Status: Abnormal   Collection Time: 11/11/18  5:18 PM  Result Value Ref Range Status   Carba Resistance IMP Gene NOT DETECTED NOT DETECTED Final   Carba Resistance VIM Gene NOT DETECTED NOT DETECTED Final   Carba Resistance NDM Gene NOT DETECTED NOT DETECTED Final   Carba Resistance KPC Gene DETECTED (A) NOT DETECTED Final   Carba Resistance OXA48 Gene NOT DETECTED NOT DETECTED Final    Comment: (NOTE) Cepheid Carba-R is an FDA-cleared nucleic acid amplification test  (NAAT)for the detection and differentiation of genes encoding the  most prevalent carbapenemases in bacterial isolate samples. Carbapenemase gene identification and implementation of comprehensive  infection control measures are recommended by the CDC to prevent the  spread of the resistant organisms. Performed at [facility], [address] [place], [place] [address]   SARS Coronavirus 2 (CEPHEID- Performed in [place] hospital lab), Hosp Order     Status: None   Collection Time: 11/11/18 10:04 PM   Specimen: Nasopharyngeal Swab   Result Value Ref Range Status   SARS Coronavirus 2 NEGATIVE NEGATIVE Final    Comment: (NOTE) If result is NEGATIVE SARS-CoV-2 target nucleic acids are NOT DETECTED. The SARS-CoV-2 RNA is generally detectable in upper and lower  respiratory specimens during the acute phase of infection. The lowest  concentration of SARS-CoV-2 viral copies this assay can detect is 250  copies / mL. A negative result does not preclude SARS-CoV-2 infection  and should not be used   as the sole basis for treatment or other  patient management decisions.  A negative result may occur with  improper specimen collection / handling, submission of specimen other  than nasopharyngeal swab, presence of viral mutation(s) within the  areas targeted by this assay, and inadequate number of viral copies  (<250 copies / mL). A negative result must be combined with clinical  observations, patient history, and epidemiological information. If result is POSITIVE SARS-CoV-2 target nucleic acids are DETECTED. The SARS-CoV-2 RNA is generally detectable in upper and lower  respiratory specimens dur ing the acute phase of infection.  Positive  results are indicative of active infection with SARS-CoV-2.  Clinical  correlation with patient history and other diagnostic information is  necessary to determine patient infection status.  Positive results do  not rule out bacterial infection or co-infection with other viruses. If result is PRESUMPTIVE POSTIVE SARS-CoV-2 nucleic acids MAY BE PRESENT.   A presumptive positive result was obtained on the submitted specimen  and confirmed on repeat testing.  While 2019 novel coronavirus  (SARS-CoV-2) nucleic acids may be present in the submitted sample  additional confirmatory testing may be necessary for epidemiological  and / or clinical management purposes  to differentiate between  SARS-CoV-2 and other Sarbecovirus currently known to infect humans.  If clinically indicated additional  testing with an alternate test  methodology [phone]) is advised. The SARS-CoV-2 RNA is generally  detectable in upper and lower respiratory sp ecimens during the acute  phase of infection. The expected result is Negative. Fact Sheet for Patients:  StrictlyIdeas.no Fact Sheet for Healthcare Providers: BankingDealers.co.za This test is not yet approved or cleared by the [place] FDA and has been authorized for detection and/or diagnosis of SARS-CoV-2 by FDA under an Emergency Use Authorization (EUA).  This EUA will remain in effect (meaning this test can be used) for the duration of the COVID-19 declaration under Section 564(b)(1) of the Act, 21 U.S.C. section 360bbb-3(b)(1), unless the authorization is terminated or revoked sooner. Performed at [facility], [place] [address] [place], [place] [address]   Urine culture     Status: Abnormal   Collection Time: 11/11/18 10:17 PM   Specimen: Urine, Catheterized  Result Value Ref Range Status   Specimen Description URINE, CATHETERIZED  Final   Special Requests NONE  Final   Culture (A)  Final    >=100,000 COLONIES/mL PROTEUS MIRABILIS 70,000 COLONIES/mL KLEBSIELLA PNEUMONIAE PREVIOUS CRC POSITIVE IN BLOOD CULTURE Performed at [facility], [place] [address] [place], [place] [address]    Report Status 11/15/2018 FINAL  Final   Organism ID, Bacteria PROTEUS MIRABILIS (A)  Final   Organism ID, Bacteria KLEBSIELLA PNEUMONIAE (A)  Final      Susceptibility   Proteus mirabilis - MIC*    AMPICILLIN >=32 RESISTANT Resistant     CEFAZOLIN >=64 RESISTANT Resistant     CEFTRIAXONE >=64 RESISTANT Resistant     CIPROFLOXACIN >=4 RESISTANT Resistant     GENTAMICIN <=1 SENSITIVE Sensitive     IMIPENEM 2 SENSITIVE Sensitive     NITROFURANTOIN 128 RESISTANT Resistant     TRIMETH/SULFA <=20 SENSITIVE Sensitive     AMPICILLIN/SULBACTAM 8 SENSITIVE Sensitive     PIP/TAZO <=4 SENSITIVE  Sensitive     * >=100,000 COLONIES/mL PROTEUS MIRABILIS  Blood Culture (routine x 2)     Status: Abnormal   Collection Time: 11/11/18 10:37 PM   Specimen: BLOOD RIGHT ARM  Result Value Ref Range Status   Specimen  Description BLOOD RIGHT ARM  Final   Special Requests   Final    BOTTLES DRAWN AEROBIC AND ANAEROBIC Blood Culture results may not be optimal due to an excessive volume of blood received in culture bottles   Culture  Setup Time   Final    GRAM NEGATIVE RODS AEROBIC BOTTLE ONLY CRITICAL RESULT CALLED TO, READ BACK BY AND VERIFIED WITH: M BATONTI,PHARMD AT 1938 11/12/2018 BY L BENFIELD Performed at [facility], [address] [place], [place] [address]    Culture (A)  Final    KLEBSIELLA PNEUMONIAE CONFIRMED CARBAPENEMASE RESISTANT ENTEROBACTERIACAE    Report Status 11/14/2018 FINAL  Final   Organism ID, Bacteria KLEBSIELLA PNEUMONIAE  Final      Susceptibility   Klebsiella pneumoniae - MIC*    AMPICILLIN >=32 RESISTANT Resistant     CEFAZOLIN >=64 RESISTANT Resistant     CEFEPIME 16 INTERMEDIATE Intermediate     CEFTAZIDIME >=64 RESISTANT Resistant     CEFTRIAXONE >=64 RESISTANT Resistant     CIPROFLOXACIN >=4 RESISTANT Resistant     GENTAMICIN 8 INTERMEDIATE Intermediate     IMIPENEM 8 INTERMEDIATE Intermediate     TRIMETH/SULFA >=320 RESISTANT Resistant     AMPICILLIN/SULBACTAM >=32 RESISTANT Resistant     PIP/TAZO >=128 RESISTANT Resistant     Extended ESBL NEGATIVE Sensitive     * KLEBSIELLA PNEUMONIAE  Blood Culture ID Panel (Reflexed)     Status: Abnormal   Collection Time: 11/11/18 10:37 PM  Result Value Ref Range Status   Enterococcus species NOT DETECTED NOT DETECTED Final   Listeria monocytogenes NOT DETECTED NOT DETECTED Final   Staphylococcus species NOT DETECTED NOT DETECTED Final   Staphylococcus aureus (BCID) NOT DETECTED NOT DETECTED Final   Streptococcus species NOT DETECTED NOT DETECTED Final   Streptococcus agalactiae NOT DETECTED NOT  DETECTED Final   Streptococcus pneumoniae NOT DETECTED NOT DETECTED Final   Streptococcus pyogenes NOT DETECTED NOT DETECTED Final   Acinetobacter baumannii NOT DETECTED NOT DETECTED Final   Enterobacteriaceae species DETECTED (A) NOT DETECTED Final    Comment: Enterobacteriaceae represent a large family of gram-negative bacteria, not a single organism. CRITICAL RESULT CALLED TO, READ BACK BY AND VERIFIED WITH: M BITONTI,PHARMD AT 1938 11/12/2018 BY L BENFIELD    Enterobacter cloacae complex NOT DETECTED NOT DETECTED Final   Escherichia coli NOT DETECTED NOT DETECTED Final   Klebsiella oxytoca NOT DETECTED NOT DETECTED Final   Klebsiella pneumoniae DETECTED (A) NOT DETECTED Final    Comment: CRITICAL RESULT CALLED TO, READ BACK BY AND VERIFIED WITH: M BITONTI,PHARMD AT 1938 11/12/2018 BY L BENFIELD    Proteus species NOT DETECTED NOT DETECTED Final   Serratia marcescens NOT DETECTED NOT DETECTED Final   Carbapenem resistance DETECTED (A) NOT DETECTED Final    Comment: CRITICAL RESULT CALLED TO, READ BACK BY AND VERIFIED WITH: M BITONTI,PHARMD AT 1938 11/12/2018 BY L BENFIELD    Haemophilus influenzae NOT DETECTED NOT DETECTED Final   Neisseria meningitidis NOT DETECTED NOT DETECTED Final   Pseudomonas aeruginosa NOT DETECTED NOT DETECTED Final   Candida albicans NOT DETECTED NOT DETECTED Final   Candida glabrata NOT DETECTED NOT DETECTED Final   Candida krusei NOT DETECTED NOT DETECTED Final   Candida parapsilosis NOT DETECTED NOT DETECTED Final   Candida tropicalis NOT DETECTED NOT DETECTED Final    Comment: Performed at [facility], [address] [place], [place] [address]  Blood Culture (routine x 2)     Status: None     Collection Time: 11/11/18 10:39 PM   Specimen: BLOOD RIGHT HAND  Result Value Ref Range Status   Specimen Description BLOOD RIGHT HAND  Final   Special Requests   Final    BOTTLES DRAWN AEROBIC ONLY Blood Culture adequate volume   Culture   Final    NO  GROWTH 5 DAYS Performed at [facility], [address] [place], [place] [address]    Report Status 11/16/2018 FINAL  Final  C difficile quick scan w PCR reflex     Status: None   Collection Time: 11/12/18  2:09 AM   Specimen: STOOL  Result Value Ref Range Status   C Diff antigen NEGATIVE NEGATIVE Final   C Diff toxin NEGATIVE NEGATIVE Final   C Diff interpretation No C. difficile detected.  Final    Comment: Performed at [facility], [address] [place], [place] [address]  Culture, respiratory (tracheal aspirate)     Status: None   Collection Time: 11/12/18  3:47 AM   Specimen: Tracheal Aspirate; Respiratory  Result Value Ref Range Status   Specimen Description TRACHEAL ASPIRATE  Final   Special Requests NONE  Final   Gram Stain   Final    FEW WBC PRESENT, PREDOMINANTLY PMN MODERATE GRAM NEGATIVE RODS FEW GRAM POSITIVE COCCI IN PAIRS RARE GRAM POSITIVE RODS    Culture   Final    MODERATE ACINETOBACTER CALCOACETICUS/BAUMANNII COMPLEX MODERATE STENOTROPHOMONAS MALTOPHILIA MULTI-DRUG RESISTANT ORGANISM CRITICAL RESULT CALLED TO, READ BACK BY AND VERIFIED WITH: PHARMD G FRENS [id number] AT 945 AM BY CM Performed at [facility], [address] [place], [place] [address]    Report Status 11/16/2018 FINAL  Final   Organism ID, Bacteria STENOTROPHOMONAS MALTOPHILIA  Final   Organism ID, Bacteria ACINETOBACTER CALCOACETICUS/BAUMANNII COMPLEX  Final      Susceptibility   Acinetobacter calcoaceticus/baumannii complex - MIC*    CEFTAZIDIME >=64 RESISTANT Resistant     CEFTRIAXONE >=64 RESISTANT Resistant     CIPROFLOXACIN >=4 RESISTANT Resistant     GENTAMICIN 8 INTERMEDIATE Intermediate     IMIPENEM >=16 RESISTANT Resistant     PIP/TAZO >=128 RESISTANT Resistant     TRIMETH/SULFA 160 RESISTANT Resistant     CEFEPIME >=64 RESISTANT Resistant     AMPICILLIN/SULBACTAM 4 SENSITIVE Sensitive     * MODERATE ACINETOBACTER CALCOACETICUS/BAUMANNII COMPLEX    Stenotrophomonas maltophilia - MIC*    LEVOFLOXACIN 1 SENSITIVE Sensitive     TRIMETH/SULFA 40 SENSITIVE Sensitive     * MODERATE STENOTROPHOMONAS MALTOPHILIA  MRSA PCR Screening     Status: None   Collection Time: 11/14/18  9:19 AM   Specimen: Nasal Mucosa; Nasopharyngeal  Result Value Ref Range Status   MRSA by PCR NEGATIVE NEGATIVE Final    Comment:        The GeneXpert MRSA Assay (FDA approved for NASAL specimens only), is one component of a comprehensive MRSA colonization surveillance program. It is not intended to diagnose MRSA infection nor to guide or monitor treatment for MRSA infections. Performed at [facility], [address] [place],  [address]   Culture, blood (routine x 2)     Status: None (Preliminary result)   Collection Time: 11/19/18  2:16 PM   Specimen: BLOOD RIGHT HAND  Result Value Ref Range Status   Specimen Description BLOOD RIGHT HAND  Final   Special Requests   Final    BOTTLES DRAWN AEROBIC ONLY Blood Culture adequate volume   Culture     Final    NO GROWTH < 24 HOURS Performed at [facility], [address] [place], [place] [address]    Report Status PENDING  Incomplete  Culture, blood (routine x 2)     Status: None (Preliminary result)   Collection Time: 11/19/18  2:27 PM   Specimen: BLOOD RIGHT HAND  Result Value Ref Range Status   Specimen Description BLOOD RIGHT HAND  Final   Special Requests   Final    BOTTLES DRAWN AEROBIC ONLY Blood Culture adequate volume   Culture   Final    NO GROWTH < 24 HOURS Performed at [facility], [address] [place], [place] [address]    Report Status PENDING  Incomplete      Radiology Studies: Dg Chest Port 1 View  Result Date: 11/19/2018 CLINICAL DATA:  Fever and chills. EXAM: PORTABLE CHEST 1 VIEW COMPARISON:  November 11, 2018 FINDINGS: Tracheostomy tube in stable position. Cardiomediastinal silhouette is normal. Mediastinal contours appear intact. Calcific atherosclerotic  disease of the aorta. Mild peribronchial airspace opacities in bilateral lower lobes, left greater than right. Osseous structures are without acute abnormality. Soft tissues are grossly normal. IMPRESSION: Mild peribronchial airspace opacities in bilateral lower lobes, left greater than right. Nonspecific findings which may represent atelectasis or multifocal airspace disease. Electronically Signed   By: Dobrinka  Dimitrova M.D.   On: 11/19/2018 15:22        Scheduled Meds: . acetaminophen (TYLENOL) oral liquid 160 mg/5 mL  1,000 mg Per Tube TID  . aspirin  81 mg Per Tube Daily  . chlorhexidine gluconate (MEDLINE KIT)  15 mL Mouth Rinse BID  . Chlorhexidine Gluconate Cloth  6 each Topical Daily  . free water  300 mL Per Tube Q8H  . insulin aspart  0-9 Units Subcutaneous Q4H  . mouth rinse  15 mL Mouth Rinse 10 times per day  . metoprolol tartrate  12.5 mg Per Tube BID  . midodrine  5 mg Per Tube TID WC  . multivitamin  15 mL Per Tube Daily  . nutrition supplement (JUVEN)  1 packet Per Tube BID  . pantoprazole sodium  40 mg Per Tube Daily  . phosphorus  500 mg Per Tube BID  . zinc sulfate  220 mg Per Tube Daily   Continuous Infusions: . sodium chloride Stopped (11/19/18 1712)  . ceftazidime avibactam (AVYCAZ) IVPB 1.25 g (11/20/18 0914)  . feeding supplement (PIVOT 1.5 CAL) 1,000 mL (11/19/18 2310)  . levETIRAcetam 500 mg (11/20/18 0537)  . norepinephrine (LEVOPHED) Adult infusion Stopped (11/14/18 0057)  . phenytoin (DILANTIN) IV 130 mg (11/20/18 0916)  . valproate sodium 125 mg (11/20/18 1148)    Assessment & Plan:    1. Gram -ve Bacteremia/UTI/spesis with septic shock: POA. Had indwelling foley catheter on arrival that has been removed. Now on external catheter and able to void. Blood cx grew Klebsiella, urine cx grew Klebsiella and proteus. ID following and recommend 7 days of antibiotics through today, 7/13. Patient has leucocytosis on labs (14K) with tmax 101.2 over the  weekend. Has diarrhea but recent C.diff -ve. Will recheck.  Chest x-ray suggestive of peribronchial/patchy infiltrates.  Requested Dr. Campbell, ID to f/u and recommended regarding abx course.  Repeat blood cx from July 12- so far  2. Chronic systolic CHF: EF 45-50%. Compensated currently.   3. Paroxysmal A. fib: Continue low-dose metoprolol    Not on anticoagulation.    4. Acute resp distress with Chronic respiratory failure: Vent dependent,   on trach at baseline.  PCCM following and managing Vent. CXR as above, received 1 dose of IV Lasix yesterday.  Remains on antibiotics.  5. Hypernatremia: Started on D5W on 7/9 and increased free water(300ml q8). Sodium slowly improving and now at 152.  Discontinued dextrose fluids and concern for anasarca over the weekend.  6. History of coronary disease: On aspirin/metoprolol   7. Hypokalemia/Hypophosphatemia : Been supplementing PRN.  Added phosphorus with meals today given persistent hypophosphatemia.  8.Elevated liver enzymes: Thought to be secondary to shock liver.   9. History of seizure disorder: On IV antiepileptic drugs.  Currently on Dilantin, Keppra, valproic acid  10.  COPD: Currently stable.  Continue bronchodilators as needed.  11.  Acute on chronic anemia: likely Due to chronic disease/dilutional.Hgb trending down 12.5>10.1>9.3->8.6->7.7->7.6   Continue to monitor.  No signs of obvious bleeding as d/w bedside nurse. H&h in am  12: Pressure ulcers: Supportive management  13. Goals of care: Palliative care evaluated patient during the hospital course and discussed with family who insists on aggressive care and patient remains full code.  Palliative care did follow-up over the weekend given new symptomatology.  They recommend adding scheduled Tylenol for better pain control.    DVT prophylaxis: Hold Lovenox given drop in hgb  Code Status: Full code Family / Patient Communication: None at bedside.  I did speak to daughter  yesterday, updated of current events and explained poor prognosis.  She appeared to be understanding however wants to continue current level of care but will be open to palliative care discussions periodically to reassess goals. Disposition Plan: Back to [facility] after abx course if remains afebrile and hgb stable     LOS: 8 days    Time spent: 35 minutes     , MD [facility] Pager [phone]  If 7PM-7AM, please contact night-coverage [email or web address] Password [email or web address] 11/20/2018, 3:36 PM  

## 2018-11-20 NOTE — Progress Notes (Signed)
Patient ID: Melissa Cochran, female   DOB: 12/17/1935, 83 y.o.   MRN: [id number]         [facility] for Infectious Disease  Date of Admission:  11/11/2018           Day 8 ceftazidime-avibactim ASSESSMENT: She continues to do very poorly and I think the chance that she will improve and recover is essentially nil.  I will go ahead as planned and stop antibiotics today.  Will await results of final blood cultures and repeat C. difficile testing.  PLAN: 1. Observe off of antibiotics pending final blood cultures and C. difficile testing  Principal Problem:   Fever Active Problems:   Bacteremia due to Klebsiella pneumoniae   Chronic respiratory failure (HCC)   Sepsis ([place])   UTI (urinary tract infection)   Pressure injury of skin   Scheduled Meds: . acetaminophen (TYLENOL) oral liquid 160 mg/5 mL  1,000 mg Per Tube TID  . aspirin  81 mg Per Tube Daily  . chlorhexidine gluconate (MEDLINE KIT)  15 mL Mouth Rinse BID  . Chlorhexidine Gluconate Cloth  6 each Topical Daily  . free water  300 mL Per Tube Q8H  . insulin aspart  0-9 Units Subcutaneous Q4H  . mouth rinse  15 mL Mouth Rinse 10 times per day  . metoprolol tartrate  12.5 mg Per Tube BID  . midodrine  5 mg Per Tube TID WC  . multivitamin  15 mL Per Tube Daily  . nutrition supplement (JUVEN)  1 packet Per Tube BID  . pantoprazole sodium  40 mg Per Tube Daily  . phosphorus  500 mg Per Tube BID  . zinc sulfate  220 mg Per Tube Daily   Continuous Infusions: . sodium chloride Stopped (11/19/18 1712)  . ceftazidime avibactam (AVYCAZ) IVPB 1.25 g (11/20/18 0914)  . feeding supplement (PIVOT 1.5 CAL) 1,000 mL (11/19/18 2310)  . levETIRAcetam 500 mg (11/20/18 0537)  . norepinephrine (LEVOPHED) Adult infusion Stopped (11/14/18 0057)  . phenytoin (DILANTIN) IV 130 mg (11/20/18 1612)  . valproate sodium 125 mg (11/20/18 1148)   PRN Meds:.sodium chloride, acetaminophen (TYLENOL) oral liquid 160 mg/5 mL, bisacodyl, fentaNYL  (SUBLIMAZE) injection, fentaNYL (SUBLIMAZE) injection, ipratropium-albuterol, loperamide, sodium chloride flush, traMADol   SUBJECTIVE: JAMELLE NOY is a 83 y.o. female with a history of CVA, chronic respiratory failure who is status post tracheostomy.  She was transferred from [facility] on 11/12/2018 with a temperature of 102.3 degrees, hypotension and worsening mental status.  Both admission blood cultures grew Klebsiella and BCID detected Carbapenem resistance.    She has now completed 8 days of ceftazidime-avibactim.  She continues to have low-grade fevers up to 100.8 degrees recently.  Her ventilator status is essentially unchanged.  I do not see any new infiltrates on chest x-ray.  Blood cultures are negative at 24 hours.  Sputum cultures done on 11/12/2018 grew stenotrophomonas and a multidrug-resistant Acinetobacter.  She continues to have copious diarrhea.  Review of Systems: Review of Systems  Unable to perform ROS: Mental acuity    No Known Allergies  OBJECTIVE: Vitals:   11/20/18 1500 11/20/18 1518 11/20/18 1529 11/20/18 1600  BP: 108/64 108/64  (!) 106/51  Pulse: (!) 102 99  (!) 115  Resp: (!) 28 (!) 26  (!) 23  Temp:   (!) 100.9 F (38.3 C)   TempSrc:      SpO2: 100% 100%  100%  Weight:      Height:  Body mass index is 26.21 kg/m.  Physical Exam Constitutional:      Comments: She remains unresponsive on the ventilator.  Cardiovascular:     Rate and Rhythm: Normal rate and regular rhythm.     Heart sounds: No murmur.  Pulmonary:     Breath sounds: Normal breath sounds.  Abdominal:     Palpations: Abdomen is soft.  Skin:    Findings: No rash.     Comments: IV site looks okay.     Lab Results Lab Results  Component Value Date   WBC 14.7 (H) 11/20/2018   HGB 7.6 (L) 11/20/2018   HCT 27.0 (L) 11/20/2018   MCV 103.1 (H) 11/20/2018   PLT 287 11/20/2018    Lab Results  Component Value Date   CREATININE 0.50 11/20/2018   BUN 61 (H)  11/20/2018   NA 152 (H) 11/20/2018   K 4.0 11/20/2018   CL 121 (H) 11/20/2018   CO2 21 (L) 11/20/2018    Lab Results  Component Value Date   ALT 424 (H) 11/16/2018   AST 118 (H) 11/16/2018   ALKPHOS 144 (H) 11/16/2018   BILITOT 1.2 11/16/2018     Microbiology: Recent Results (from the past 240 hour(s))  Carbapenem Resistance Panel     Status: Abnormal   Collection Time: 11/11/18  5:18 PM  Result Value Ref Range Status   Carba Resistance IMP Gene NOT DETECTED NOT DETECTED Final   Carba Resistance VIM Gene NOT DETECTED NOT DETECTED Final   Carba Resistance NDM Gene NOT DETECTED NOT DETECTED Final   Carba Resistance KPC Gene DETECTED (A) NOT DETECTED Final   Carba Resistance OXA48 Gene NOT DETECTED NOT DETECTED Final    Comment: (NOTE) Cepheid Carba-R is an FDA-cleared nucleic acid amplification test  (NAAT)for the detection and differentiation of genes encoding the  most prevalent carbapenemases in bacterial isolate samples. Carbapenemase gene identification and implementation of comprehensive  infection control measures are recommended by the CDC to prevent the  spread of the resistant organisms. Performed at [facility], [place] [address] [place], [place] [address]   SARS Coronavirus 2 (CEPHEID- Performed in [place] hospital lab), Hosp Order     Status: None   Collection Time: 11/11/18 10:04 PM   Specimen: Nasopharyngeal Swab  Result Value Ref Range Status   SARS Coronavirus 2 NEGATIVE NEGATIVE Final    Comment: (NOTE) If result is NEGATIVE SARS-CoV-2 target nucleic acids are NOT DETECTED. The SARS-CoV-2 RNA is generally detectable in upper and lower  respiratory specimens during the acute phase of infection. The lowest  concentration of SARS-CoV-2 viral copies this assay can detect is 250  copies / mL. A negative result does not preclude SARS-CoV-2 infection  and should not be used as the sole basis for treatment or other  patient management decisions.  A  negative result may occur with  improper specimen collection / handling, submission of specimen other  than nasopharyngeal swab, presence of viral mutation(s) within the  areas targeted by this assay, and inadequate number of viral copies  (<250 copies / mL). A negative result must be combined with clinical  observations, patient history, and epidemiological information. If result is POSITIVE SARS-CoV-2 target nucleic acids are DETECTED. The SARS-CoV-2 RNA is generally detectable in upper and lower  respiratory specimens dur ing the acute phase of infection.  Positive  results are indicative of active infection with SARS-CoV-2.  Clinical  correlation with patient history and other diagnostic information is  necessary  to determine patient infection status.  Positive results do  not rule out bacterial infection or co-infection with other viruses. If result is PRESUMPTIVE POSTIVE SARS-CoV-2 nucleic acids MAY BE PRESENT.   A presumptive positive result was obtained on the submitted specimen  and confirmed on repeat testing.  While 2019 novel coronavirus  (SARS-CoV-2) nucleic acids may be present in the submitted sample  additional confirmatory testing may be necessary for epidemiological  and / or clinical management purposes  to differentiate between  SARS-CoV-2 and other Sarbecovirus currently known to infect humans.  If clinically indicated additional testing with an alternate test  methodology [phone]) is advised. The SARS-CoV-2 RNA is generally  detectable in upper and lower respiratory sp ecimens during the acute  phase of infection. The expected result is Negative. Fact Sheet for Patients:  StrictlyIdeas.no Fact Sheet for Healthcare Providers: BankingDealers.co.za This test is not yet approved or cleared by the [place] FDA and has been authorized for detection and/or diagnosis of SARS-CoV-2 by FDA under an Emergency Use  Authorization (EUA).  This EUA will remain in effect (meaning this test can be used) for the duration of the COVID-19 declaration under Section 564(b)(1) of the Act, 21 U.S.C. section 360bbb-3(b)(1), unless the authorization is terminated or revoked sooner. Performed at [facility], [place] [address], [place], [place] [address]   Urine culture     Status: Abnormal   Collection Time: 11/11/18 10:17 PM   Specimen: Urine, Catheterized  Result Value Ref Range Status   Specimen Description URINE, CATHETERIZED  Final   Special Requests NONE  Final   Culture (A)  Final    >=100,000 COLONIES/mL PROTEUS MIRABILIS 70,000 COLONIES/mL KLEBSIELLA PNEUMONIAE PREVIOUS CRC POSITIVE IN BLOOD CULTURE Performed at [facility], [place] [address], [place], [place] [address]    Report Status 11/15/2018 FINAL  Final   Organism ID, Bacteria PROTEUS MIRABILIS (A)  Final   Organism ID, Bacteria KLEBSIELLA PNEUMONIAE (A)  Final      Susceptibility   Proteus mirabilis - MIC*    AMPICILLIN >=32 RESISTANT Resistant     CEFAZOLIN >=64 RESISTANT Resistant     CEFTRIAXONE >=64 RESISTANT Resistant     CIPROFLOXACIN >=4 RESISTANT Resistant     GENTAMICIN <=1 SENSITIVE Sensitive     IMIPENEM 2 SENSITIVE Sensitive     NITROFURANTOIN 128 RESISTANT Resistant     TRIMETH/SULFA <=20 SENSITIVE Sensitive     AMPICILLIN/SULBACTAM 8 SENSITIVE Sensitive     PIP/TAZO <=4 SENSITIVE Sensitive     * >=100,000 COLONIES/mL PROTEUS MIRABILIS  Blood Culture (routine x 2)     Status: Abnormal   Collection Time: 11/11/18 10:37 PM   Specimen: BLOOD RIGHT ARM  Result Value Ref Range Status   Specimen Description BLOOD RIGHT ARM  Final   Special Requests   Final    BOTTLES DRAWN AEROBIC AND ANAEROBIC Blood Culture results may not be optimal due to an excessive volume of blood received in culture bottles   Culture  Setup Time   Final    GRAM NEGATIVE RODS AEROBIC BOTTLE ONLY CRITICAL RESULT CALLED TO, READ BACK BY AND  VERIFIED WITH: Konrad Saha AT 4782 11/12/2018 BY L BENFIELD Performed at [facility], [address], [place], [place] [address]    Culture (A)  Final    KLEBSIELLA PNEUMONIAE CONFIRMED CARBAPENEMASE RESISTANT ENTEROBACTERIACAE    Report Status 11/14/2018 FINAL  Final   Organism ID, Bacteria KLEBSIELLA PNEUMONIAE  Final  Susceptibility   Klebsiella pneumoniae - MIC*    AMPICILLIN >=32 RESISTANT Resistant     CEFAZOLIN >=64 RESISTANT Resistant     CEFEPIME 16 INTERMEDIATE Intermediate     CEFTAZIDIME >=64 RESISTANT Resistant     CEFTRIAXONE >=64 RESISTANT Resistant     CIPROFLOXACIN >=4 RESISTANT Resistant     GENTAMICIN 8 INTERMEDIATE Intermediate     IMIPENEM 8 INTERMEDIATE Intermediate     TRIMETH/SULFA >=320 RESISTANT Resistant     AMPICILLIN/SULBACTAM >=32 RESISTANT Resistant     PIP/TAZO >=128 RESISTANT Resistant     Extended ESBL NEGATIVE Sensitive     * KLEBSIELLA PNEUMONIAE  Blood Culture ID Panel (Reflexed)     Status: Abnormal   Collection Time: 11/11/18 10:37 PM  Result Value Ref Range Status   Enterococcus species NOT DETECTED NOT DETECTED Final   Listeria monocytogenes NOT DETECTED NOT DETECTED Final   Staphylococcus species NOT DETECTED NOT DETECTED Final   Staphylococcus aureus (BCID) NOT DETECTED NOT DETECTED Final   Streptococcus species NOT DETECTED NOT DETECTED Final   Streptococcus agalactiae NOT DETECTED NOT DETECTED Final   Streptococcus pneumoniae NOT DETECTED NOT DETECTED Final   Streptococcus pyogenes NOT DETECTED NOT DETECTED Final   Acinetobacter baumannii NOT DETECTED NOT DETECTED Final   Enterobacteriaceae species DETECTED (A) NOT DETECTED Final    Comment: Enterobacteriaceae represent a large family of gram-negative bacteria, not a single organism. CRITICAL RESULT CALLED TO, READ BACK BY AND VERIFIED WITH: M BITONTI,PHARMD AT 5465 11/12/2018 BY L BENFIELD    Enterobacter cloacae complex NOT DETECTED NOT DETECTED Final   Escherichia  coli NOT DETECTED NOT DETECTED Final   Klebsiella oxytoca NOT DETECTED NOT DETECTED Final   Klebsiella pneumoniae DETECTED (A) NOT DETECTED Final    Comment: CRITICAL RESULT CALLED TO, READ BACK BY AND VERIFIED WITH: M BITONTI,PHARMD AT 1938 11/12/2018 BY L BENFIELD    Proteus species NOT DETECTED NOT DETECTED Final   Serratia marcescens NOT DETECTED NOT DETECTED Final   Carbapenem resistance DETECTED (A) NOT DETECTED Final    Comment: CRITICAL RESULT CALLED TO, READ BACK BY AND VERIFIED WITH: M BITONTI,PHARMD AT 1938 11/12/2018 BY L BENFIELD    Haemophilus influenzae NOT DETECTED NOT DETECTED Final   Neisseria meningitidis NOT DETECTED NOT DETECTED Final   Pseudomonas aeruginosa NOT DETECTED NOT DETECTED Final   Candida albicans NOT DETECTED NOT DETECTED Final   Candida glabrata NOT DETECTED NOT DETECTED Final   Candida krusei NOT DETECTED NOT DETECTED Final   Candida parapsilosis NOT DETECTED NOT DETECTED Final   Candida tropicalis NOT DETECTED NOT DETECTED Final    Comment: Performed at [facility], [place] [address] [place], [place] [address]  Blood Culture (routine x 2)     Status: None   Collection Time: 11/11/18 10:39 PM   Specimen: BLOOD RIGHT HAND  Result Value Ref Range Status   Specimen Description BLOOD RIGHT HAND  Final   Special Requests   Final    BOTTLES DRAWN AEROBIC ONLY Blood Culture adequate volume   Culture   Final    NO GROWTH 5 DAYS Performed at [facility], [place] [address] [place], [place] [address]    Report Status 11/16/2018 FINAL  Final  C difficile quick scan w PCR reflex     Status: None   Collection Time: 11/12/18  2:09 AM   Specimen: STOOL  Result Value Ref Range Status   C Diff antigen NEGATIVE NEGATIVE Final   C Diff toxin NEGATIVE NEGATIVE  Final   C Diff interpretation No C. difficile detected.  Final    Comment: Performed at [facility], [place] [address], [place], [place] [address]  Culture, respiratory (tracheal aspirate)      Status: None   Collection Time: 11/12/18  3:47 AM   Specimen: Tracheal Aspirate; Respiratory  Result Value Ref Range Status   Specimen Description TRACHEAL ASPIRATE  Final   Special Requests NONE  Final   Gram Stain   Final    FEW WBC PRESENT, PREDOMINANTLY PMN MODERATE GRAM NEGATIVE RODS FEW GRAM POSITIVE COCCI IN PAIRS RARE GRAM POSITIVE RODS    Culture   Final    MODERATE ACINETOBACTER CALCOACETICUS/BAUMANNII COMPLEX MODERATE STENOTROPHOMONAS MALTOPHILIA MULTI-DRUG RESISTANT ORGANISM CRITICAL RESULT CALLED TO, READ BACK BY AND VERIFIED WITH: PHARMD [place] [id number] AT 945 AM BY CM Performed at [facility], [place] [address], [place], [place] [address]    Report Status 11/16/2018 FINAL  Final   Organism ID, Bacteria STENOTROPHOMONAS MALTOPHILIA  Final   Organism ID, Bacteria ACINETOBACTER CALCOACETICUS/BAUMANNII COMPLEX  Final      Susceptibility   Acinetobacter calcoaceticus/baumannii complex - MIC*    CEFTAZIDIME >=64 RESISTANT Resistant     CEFTRIAXONE >=64 RESISTANT Resistant     CIPROFLOXACIN >=4 RESISTANT Resistant     GENTAMICIN 8 INTERMEDIATE Intermediate     IMIPENEM >=16 RESISTANT Resistant     PIP/TAZO >=128 RESISTANT Resistant     TRIMETH/SULFA 160 RESISTANT Resistant     CEFEPIME >=64 RESISTANT Resistant     AMPICILLIN/SULBACTAM 4 SENSITIVE Sensitive     * MODERATE ACINETOBACTER CALCOACETICUS/BAUMANNII COMPLEX   Stenotrophomonas maltophilia - MIC*    LEVOFLOXACIN 1 SENSITIVE Sensitive     TRIMETH/SULFA 40 SENSITIVE Sensitive     * MODERATE STENOTROPHOMONAS MALTOPHILIA  MRSA PCR Screening     Status: None   Collection Time: 11/14/18  9:19 AM   Specimen: Nasal Mucosa; Nasopharyngeal  Result Value Ref Range Status   MRSA by PCR NEGATIVE NEGATIVE Final    Comment:        The GeneXpert MRSA Assay (FDA approved for NASAL specimens only), is one component of a comprehensive MRSA colonization surveillance program. It is not intended to diagnose MRSA  infection nor to guide or monitor treatment for MRSA infections. Performed at [facility], [place] [address] [place], [place] [address]   Culture, blood (routine x 2)     Status: None (Preliminary result)   Collection Time: 11/19/18  2:16 PM   Specimen: BLOOD RIGHT HAND  Result Value Ref Range Status   Specimen Description BLOOD RIGHT HAND  Final   Special Requests   Final    BOTTLES DRAWN AEROBIC ONLY Blood Culture adequate volume   Culture   Final    NO GROWTH < 24 HOURS Performed at [facility], [place] [address] [place], [place] [address]    Report Status PENDING  Incomplete  Culture, blood (routine x 2)     Status: None (Preliminary result)   Collection Time: 11/19/18  2:27 PM   Specimen: BLOOD RIGHT HAND  Result Value Ref Range Status   Specimen Description BLOOD RIGHT HAND  Final   Special Requests   Final    BOTTLES DRAWN AEROBIC ONLY Blood Culture adequate volume   Culture   Final    NO GROWTH < 24 HOURS Performed at [facility], [place] [address] [place], [place] [address]    Report Status PENDING  Incomplete  Michel Bickers, MD [facility] for [facility] [place] [facility] [phone] pager   [phone] cell 11/20/2018, 4:25 PM

## 2018-11-20 NOTE — Progress Notes (Signed)
Palliative Progress note.   Patient seen briefly, elderly frail lady, resting in bed, moans at times.   Discussed with bedside RN that [name] continues to be in pain and at times appears to grimace and appears tense. She is unresponsive and unable to follow any commands and unable to express any discomfort. Aggressive pain regimen not able to be implemented due to full code, full scope status and hypotension.    BP 93/66   Pulse (!) 113   Temp 98.6 F (37 C) (Oral)   Resp 18   Ht 5\' 7"  (1.702 m)   Wt 75.9 kg   SpO2 100%   BMI 26.21 kg/m  Labs and imaging reviewed, chart reviewed.  Appears pale frail weak Moans at times Has wounds   GOC have been consistent for continued full aggressive care per multiple palliative notes. I have called and re discussed again with daughter Melissa Cochran, who endorses full code, full scope, remains hopeful that the patient will improve from her infections and be transitioned back to [facility].   Pain recommendations:  - Continue fentanyl as BP allows.  - scheduled Tylenol 1  - Could consider scheduled tramadol.  - Most pain medications will have some risk given her tenuous state.  - Air mattress, turn q2h with careful positioning.   [name] MD   Palliative Medicine Team  (Please see [email or web address] for schedule) Team Phone [phone]    Greater than 50%  of this time was spent counseling and coordinating care related to the above assessment and plan   The above conversation was completed via telephone due to the visitor restrictions during the COVID-19 pandemic. Thorough chart review and discussion with necessary members of the care team was completed as part of assessment.

## 2018-11-21 DIAGNOSIS — L89153 Pressure ulcer of sacral region, stage 3: Secondary | ICD-10-CM

## 2018-11-21 LAB — GLUCOSE, CAPILLARY
Glucose-Capillary: 106 mg/dL — ABNORMAL HIGH (ref 70–99)
Glucose-Capillary: 112 mg/dL — ABNORMAL HIGH (ref 70–99)
Glucose-Capillary: 124 mg/dL — ABNORMAL HIGH (ref 70–99)
Glucose-Capillary: 131 mg/dL — ABNORMAL HIGH (ref 70–99)
Glucose-Capillary: 143 mg/dL — ABNORMAL HIGH (ref 70–99)
Glucose-Capillary: 93 mg/dL (ref 70–99)

## 2018-11-21 MED ORDER — VALPROIC ACID 250 MG/5ML PO SOLN
250.0000 mg | Freq: Two times a day (BID) | ORAL | Status: DC
  Administered 2018-11-21 – 2018-11-29 (×16): 250 mg
  Filled 2018-11-21 (×19): qty 5

## 2018-11-21 MED ORDER — LEVETIRACETAM 100 MG/ML PO SOLN
1000.0000 mg | Freq: Two times a day (BID) | ORAL | Status: DC
  Administered 2018-11-21 – 2018-11-29 (×16): 1000 mg
  Filled 2018-11-21 (×19): qty 10

## 2018-11-21 NOTE — Progress Notes (Signed)
PROGRESS NOTE    Melissa Cochran  EQA:[id number] DOB: May 20, 1935 DOA: 11/11/2018 PCP: [name], MD    Brief Narrative:  83 year old female who presented with sepsis.  She does have significant past medical history for chronic hypoxic respiratory failure, ventilator dependent.  Coronary disease, heart failure, paroxysmal atrial relation, COPD and CVA.  She also has seizures, DVT, GERD and hypertension.  She is a resident from [facility], she was transferred due to acute on chronic encephalopathy and hypotension related to urinary tract infection.  She does have a poor baseline, will be tracking with her eyes.  On her initial physical examination her blood pressure was 90/57, pulse rate 102, temperature 38.3 C, respiratory rate 24 oxygen saturation 98%.  She was unresponsive, trach in place, scattered rhonchi bilaterally, heart S1-S2 present regular, abdomen soft, no lower extremity edema.  Patient was admitted with a working diagnosis of sepsis due to urinary tract infection.  Patient developed shock due to gram-negative bacteremia, she required vasopressors and broad-spectrum antibiotic therapy.    Assessment & Plan:   Principal Problem:   Fever Active Problems:   Chronic respiratory failure (HCC)   Sepsis (HCC)   Bacteremia due to Klebsiella pneumoniae   UTI (urinary tract infection)   Pressure injury of skin  1.  Septic shock due to gram-negative bacteremia/Klebsiella, urine tract infection, present on admission. Patient has completed antibiotic therapy, she has remained afebrile, no significant leukocytosis. Continue off antibiotics.   2.  Chronic systolic heart failure. Stable with no signs of exacerbation, will continue blood pressure monitoring.   3.  Paroxysmal atrial fibrillation. Continue rate control and anticoagulation. Continue metoprolol.   4.  Ventilator dependent respiratory failure (hypoxic and hypercapnic), acute on chronic. Continue tolerating well  mechanical ventilation.   5.  Hyponatremia Clinically has improved, continue tube feedings.   6.  Seizures. No active seizure. Continue keppra and phenytoin.   7.  COPD. No signs of acute exacerbation,   8.  Chronic anemia. Stable Hgb and Hc.   9.  Chronic encephalopathy. Patient at her baseline.   10. Multiple pressure ulcers, right heel (unstageable), sacrum (stage III), vertebral column (stage III), left foot (unstageable)/ present on admission.  Continue local wound care, patient with very poor prognosis.   11. T2Dm. Continue insulin sliding scale for glucose cover and monitoring.   DVT prophylaxis: enoxaparin   Code Status: full Family Communication: no family at the bedside  Disposition Plan/ discharge barriers: transfer to LTAC in am.   Body mass index is 26.24 kg/m. Malnutrition Type:  Nutrition Problem: Increased nutrient needs Etiology: acute illness, chronic illness, wound healing   Malnutrition Characteristics:  Signs/Symptoms: estimated needs   Nutrition Interventions:  Interventions: Juven, Tube feeding  RN Pressure Injury Documentation: Pressure Injury 11/19/18 Heel Left Unstageable - Full thickness tissue loss in which the base of the ulcer is covered by slough (yellow, tan, gray, green or brown) and/or eschar (tan, brown or black) in the wound bed. (Active)  11/19/18 0800  Location: Heel  Location Orientation: Left  Staging: Unstageable - Full thickness tissue loss in which the base of the ulcer is covered by slough (yellow, tan, gray, green or brown) and/or eschar (tan, brown or black) in the wound bed.  Wound Description (Comments):   Present on Admission:      Pressure Injury 11/19/18 Heel Right Unstageable - Full thickness tissue loss in which the base of the ulcer is covered by slough (yellow, tan, gray, green or brown) and/or eschar (  tan, brown or black) in the wound bed. (Active)  11/19/18 0800  Location: Heel  Location Orientation: Right   Staging: Unstageable - Full thickness tissue loss in which the base of the ulcer is covered by slough (yellow, tan, gray, green or brown) and/or eschar (tan, brown or black) in the wound bed.  Wound Description (Comments):   Present on Admission:      Pressure Injury 11/19/18 Sacrum Mid Stage III -  Full thickness tissue loss. Subcutaneous fat may be visible but bone, tendon or muscle are NOT exposed. (Active)  11/19/18 0800  Location: Sacrum  Location Orientation: Mid  Staging: Stage III -  Full thickness tissue loss. Subcutaneous fat may be visible but bone, tendon or muscle are NOT exposed.  Wound Description (Comments):   Present on Admission:      Pressure Injury 11/19/18 Vertebral column Mid;Medial Stage III -  Full thickness tissue loss. Subcutaneous fat may be visible but bone, tendon or muscle are NOT exposed. (Active)  11/19/18 0800  Location: Vertebral column  Location Orientation: Mid;Medial  Staging: Stage III -  Full thickness tissue loss. Subcutaneous fat may be visible but bone, tendon or muscle are NOT exposed.  Wound Description (Comments):   Present on Admission:      Pressure Injury 11/19/18 Foot Left;Lateral Unstageable - Full thickness tissue loss in which the base of the ulcer is covered by slough (yellow, tan, gray, green or brown) and/or eschar (tan, brown or black) in the wound bed. (Active)  11/19/18 0800  Location: Foot  Location Orientation: Left;Lateral  Staging: Unstageable - Full thickness tissue loss in which the base of the ulcer is covered by slough (yellow, tan, gray, green or brown) and/or eschar (tan, brown or black) in the wound bed.  Wound Description (Comments):   Present on Admission:      Consultants:     Procedures:     Antimicrobials:       Subjective: Patient is not responsive, tolerating mechanical ventilation and tube feedings, no apparent pain or dyspnea. She has been afebrile.   Objective: Vitals:   11/21/18 0900  11/21/18 1000 11/21/18 1100 11/21/18 1129  BP: (!) 110/58 112/67 (!) 105/51 (!) 105/51  Pulse: (!) 110 (!) 119 98 (!) 111  Resp: (!) 28 (!) 27 (!) 23 (!) 30  Temp:    (!) 101.5 F (38.6 C)  TempSrc:    Oral  SpO2: 99% 100% 100% 100%  Weight:      Height:        Intake/Output Summary (Last 24 hours) at 11/21/2018 1246 Last data filed at 11/21/2018 0600 Gross per 24 hour  Intake 2430.9 ml  Output 600 ml  Net 1830.9 ml   Filed Weights   11/19/18 0500 11/20/18 0419 11/21/18 0322  Weight: 74.1 kg 75.9 kg 76 kg    Examination:   General: Not in pain or dyspnea, deconditioned  Neurology: somnolent and hyporeactive to touch.  E ENT: mild pallor, no icterus, oral mucosa moist/ trach in place.  Cardiovascular: No JVD. S1-S2 present, rhythmic, no gallops, rubs, or murmurs. +/++ pitting lower extremity edema at de dependent zones.  Pulmonary: positive breath sounds bilaterally, adequate air movement, no wheezing, rhonchi or rales. Gastrointestinal. Abdomen mild distended with no organomegaly, non tender, no rebound or guarding/ peg tube in place.  Skin. No rashes Musculoskeletal: no joint deformities     Data Reviewed: I have personally reviewed following labs and imaging studies  CBC: Recent Labs  Lab 11/15/18 0536  11/17/18 0402 11/19/18 0430 11/20/18 0404  WBC 7.8 9.6 12.4* 14.7*  NEUTROABS 6.2  --   --   --   HGB 9.2* 8.6* 7.7* 7.6*  HCT 31.4* 30.7* 27.6* 27.0*  MCV 96.6 100.7* 102.6* 103.1*  PLT 180 232 271 409   Basic Metabolic Panel: Recent Labs  Lab 11/14/18 1629  11/16/18 0459 11/17/18 0402 11/18/18 0435 11/19/18 0430 11/20/18 0404  NA  --    < > 160* 158* 155* 152* 152*  K  --    < > 4.1 2.9* 3.2* 3.9 4.0  CL  --    < > >130* 129* 126* 123* 121*  CO2  --    < > 18* 19* 19* 21* 21*  GLUCOSE  --    < > 147* 127* 136* 118* 117*  BUN  --    < > 81* 79* 74* 64* 61*  CREATININE  --    < > 0.65 0.68 0.61  0.53 0.55 0.50  CALCIUM  --    < > 9.2 8.3* 8.4* 8.5*  8.7*  MG 2.5*  --   --   --   --   --   --   PHOS 1.8*  --  <1.0* 2.5  --   --   --    < > = values in this interval not displayed.   GFR: Estimated Creatinine Clearance: 58.7 mL/min (by C-G formula based on SCr of 0.5 mg/dL). Liver Function Tests: Recent Labs  Lab 11/15/18 0536 11/16/18 1440 11/17/18 0402  AST 332* 118*  --   ALT 646* 424*  --   ALKPHOS 120 144*  --   BILITOT 1.0 1.2  --   PROT 5.8* 5.7*  --   ALBUMIN 1.8* 1.8* 1.8*   No results for input(s): LIPASE, AMYLASE in the last 168 hours. No results for input(s): AMMONIA in the last 168 hours. Coagulation Profile: No results for input(s): INR, PROTIME in the last 168 hours. Cardiac Enzymes: No results for input(s): CKTOTAL, CKMB, CKMBINDEX, TROPONINI in the last 168 hours. BNP (last 3 results) No results for input(s): PROBNP in the last 8760 hours. HbA1C: No results for input(s): HGBA1C in the last 72 hours. CBG: Recent Labs  Lab 11/20/18 2009 11/20/18 2326 11/21/18 0350 11/21/18 0732 11/21/18 1146  GLUCAP 117* 136* 106* 124* 143*   Lipid Profile: No results for input(s): CHOL, HDL, LDLCALC, TRIG, CHOLHDL, LDLDIRECT in the last 72 hours. Thyroid Function Tests: No results for input(s): TSH, T4TOTAL, FREET4, T3FREE, THYROIDAB in the last 72 hours. Anemia Panel: No results for input(s): VITAMINB12, FOLATE, FERRITIN, TIBC, IRON, RETICCTPCT in the last 72 hours.    Radiology Studies: I have reviewed all of the imaging during this hospital visit personally     Scheduled Meds: . acetaminophen (TYLENOL) oral liquid 160 mg/5 mL  1,000 mg Per Tube TID  . aspirin  81 mg Per Tube Daily  . chlorhexidine gluconate (MEDLINE KIT)  15 mL Mouth Rinse BID  . Chlorhexidine Gluconate Cloth  6 each Topical Daily  . free water  300 mL Per Tube Q8H  . insulin aspart  0-9 Units Subcutaneous Q4H  . levETIRAcetam  1,000 mg Per Tube BID  . mouth rinse  15 mL Mouth Rinse 10 times per day  . metoprolol tartrate  12.5 mg  Per Tube BID  . midodrine  5 mg Per Tube TID WC  . multivitamin  15 mL Per Tube Daily  . nutrition supplement (JUVEN)  1 packet Per Tube BID  . pantoprazole sodium  40 mg Per Tube Daily  . phosphorus  500 mg Per Tube BID  . Valproate Sodium  250 mg Per Tube BID  . zinc sulfate  220 mg Per Tube Daily   Continuous Infusions: . sodium chloride 10 mL/hr at 11/21/18 0600  . feeding supplement (PIVOT 1.5 CAL) 1,000 mL (11/20/18 1830)  . norepinephrine (LEVOPHED) Adult infusion Stopped (11/14/18 0057)  . phenytoin (DILANTIN) IV 130 mg (11/21/18 1012)     LOS: 9 days        Deric Bocock Gerome Apley, MD

## 2018-11-21 NOTE — Progress Notes (Signed)
Patient ID: Melissa Cochran, female   DOB: Jun 23, 1935, 83 y.o.   MRN: [id number]          [facility] for Infectious Disease    Date of Admission:  11/11/2018     [name] was afebrile overnight.  Repeat blood cultures are negative at nearly 48 hours.  C. difficile screen was negative and I do not appreciate any new infiltrates on her recent chest x-ray.  I recommend continued observation off of antibiotics for now.         Michel Bickers, MD [facility] [phone] pager   [phone] cell 11/21/2018, 11:12 AM

## 2018-11-22 LAB — CBC WITH DIFFERENTIAL/PLATELET
Abs Immature Granulocytes: 0.15 10*3/uL — ABNORMAL HIGH (ref 0.00–0.07)
Basophils Absolute: 0 10*3/uL (ref 0.0–0.1)
Basophils Relative: 0 %
Eosinophils Absolute: 0.3 10*3/uL (ref 0.0–0.5)
Eosinophils Relative: 2 %
HCT: 26.8 % — ABNORMAL LOW (ref 36.0–46.0)
Hemoglobin: 7.3 g/dL — ABNORMAL LOW (ref 12.0–15.0)
Immature Granulocytes: 1 %
Lymphocytes Relative: 16 %
Lymphs Abs: 1.8 10*3/uL (ref 0.7–4.0)
MCH: 29 pg (ref 26.0–34.0)
MCHC: 27.2 g/dL — ABNORMAL LOW (ref 30.0–36.0)
MCV: 106.3 fL — ABNORMAL HIGH (ref 80.0–100.0)
Monocytes Absolute: 0.4 10*3/uL (ref 0.1–1.0)
Monocytes Relative: 4 %
Neutro Abs: 8.7 10*3/uL — ABNORMAL HIGH (ref 1.7–7.7)
Neutrophils Relative %: 77 %
Platelets: 298 10*3/uL (ref 150–400)
RBC: 2.52 MIL/uL — ABNORMAL LOW (ref 3.87–5.11)
RDW: 18.8 % — ABNORMAL HIGH (ref 11.5–15.5)
WBC: 11.3 10*3/uL — ABNORMAL HIGH (ref 4.0–10.5)
nRBC: 0.3 % — ABNORMAL HIGH (ref 0.0–0.2)

## 2018-11-22 LAB — BASIC METABOLIC PANEL
BUN: 57 mg/dL — ABNORMAL HIGH (ref 8–23)
BUN: 55 mg/dL — ABNORMAL HIGH (ref 8–23)
BUN: 56 mg/dL — ABNORMAL HIGH (ref 8–23)
CO2: 22 mmol/L (ref 22–32)
CO2: 22 mmol/L (ref 22–32)
CO2: 20 mmol/L — ABNORMAL LOW (ref 22–32)
Calcium: 8.8 mg/dL — ABNORMAL LOW (ref 8.9–10.3)
Calcium: 8.9 mg/dL (ref 8.9–10.3)
Calcium: 8.6 mg/dL — ABNORMAL LOW (ref 8.9–10.3)
Chloride: >130 mmol/L (ref 98–111)
Chloride: >130 mmol/L (ref 98–111)
Chloride: >130 mmol/L (ref 98–111)
Creatinine, Ser: 0.53 mg/dL (ref 0.44–1.00)
Creatinine, Ser: 0.53 mg/dL (ref 0.44–1.00)
Creatinine, Ser: 0.61 mg/dL (ref 0.44–1.00)
GFR calc Af Amer: >60 mL/min (ref >60)
GFR calc Af Amer: >60 mL/min (ref >60)
GFR calc Af Amer: >60 mL/min (ref >60)
GFR calc non Af Amer: >60 mL/min (ref >60)
GFR calc non Af Amer: >60 mL/min (ref >60)
GFR calc non Af Amer: >60 mL/min (ref >60)
Glucose, Bld: 119 mg/dL — ABNORMAL HIGH (ref 70–99)
Glucose, Bld: 114 mg/dL — ABNORMAL HIGH (ref 70–99)
Glucose, Bld: 196 mg/dL — ABNORMAL HIGH (ref 70–99)
Potassium: 3.3 mmol/L — ABNORMAL LOW (ref 3.5–5.1)
Potassium: 3.7 mmol/L (ref 3.5–5.1)
Potassium: 3.4 mmol/L — ABNORMAL LOW (ref 3.5–5.1)
Sodium: 161 mmol/L (ref 135–145)
Sodium: 163 mmol/L (ref 135–145)
Sodium: 159 mmol/L — ABNORMAL HIGH (ref 135–145)

## 2018-11-22 LAB — GLUCOSE, CAPILLARY
Glucose-Capillary: 105 mg/dL — ABNORMAL HIGH (ref 70–99)
Glucose-Capillary: 114 mg/dL — ABNORMAL HIGH (ref 70–99)
Glucose-Capillary: 118 mg/dL — ABNORMAL HIGH (ref 70–99)
Glucose-Capillary: 139 mg/dL — ABNORMAL HIGH (ref 70–99)
Glucose-Capillary: 142 mg/dL — ABNORMAL HIGH (ref 70–99)
Glucose-Capillary: 151 mg/dL — ABNORMAL HIGH (ref 70–99)
Glucose-Capillary: 164 mg/dL — ABNORMAL HIGH (ref 70–99)

## 2018-11-22 LAB — PHENYTOIN LEVEL, TOTAL: Phenytoin Lvl: <2.5 ug/mL — ABNORMAL LOW (ref 10.0–20.0)

## 2018-11-22 MED ORDER — SODIUM CHLORIDE 0.9 % IV SOLN
1.5000 mg/kg | Freq: Two times a day (BID) | INTRAVENOUS | Status: AC
  Administered 2018-11-22 – 2018-11-28 (×14): 115 mg via INTRAVENOUS
  Filled 2018-11-22 (×16): qty 11.5

## 2018-11-22 MED ORDER — FREE WATER
300.0000 mL | Status: DC
  Administered 2018-11-22 (×2): 300 mL

## 2018-11-22 MED ORDER — SODIUM CHLORIDE 0.9 % IV SOLN
800.0000 mg | Freq: Once | INTRAVENOUS | Status: AC
  Administered 2018-11-22: 800 mg via INTRAVENOUS
  Filled 2018-11-22: qty 16

## 2018-11-22 MED ORDER — FREE WATER
250.0000 mL | Freq: Four times a day (QID) | Status: DC
  Administered 2018-11-22 – 2018-11-23 (×4): 250 mL

## 2018-11-22 MED ORDER — DEXTROSE 5 % IV SOLN
INTRAVENOUS | Status: DC
  Administered 2018-11-22 – 2018-11-29 (×8): via INTRAVENOUS

## 2018-11-22 MED ORDER — SODIUM CHLORIDE 0.9 % IV SOLN
160.0000 mg | Freq: Three times a day (TID) | INTRAVENOUS | Status: DC
  Administered 2018-11-22 – 2018-11-26 (×11): 160 mg via INTRAVENOUS
  Filled 2018-11-22 (×15): qty 3.2

## 2018-11-22 NOTE — Progress Notes (Signed)
Nutrition Follow-up  DOCUMENTATION CODES:   Not applicable  INTERVENTION:   Tube Feeding:  Pivot 1.5 at 60 ml/hr Provides135 g of protein, 2160 kcals, 1094 mL Meets 100% estimated calorie and protein needs  ContinueJuven BID,each packet provides 80 calories, 8 grams of carbohydrate, 2.5 grams of protein (collagen), 7 grams of L-arginine and 7 grams of L-glutamine; supplement contains CaHMB, Vitamins C, E, B12 and Zinc to promote wound healing  Continue Zinc supplementation for Zinc deficiency  NUTRITION DIAGNOSIS:   Increased nutrient needs related to acute illness, chronic illness, wound healing as evidenced by estimated needs.  Being addressed via TF   GOAL:   Patient will meet greater than or equal to 90% of their needs  Progressing  MONITOR:   TF tolerance, Labs, Skin, I & O's, Weight trends  REASON FOR ASSESSMENT:   Consult Enteral/tube feeding initiation and management  ASSESSMENT:   83 year old female with chronic hypoxic respiratory failure and ventilator/PEG tube dependence, CAD s/p stent in 1610, chronic systolic heart failure with EF of 45 to 50% by echo on 06/2017, history of CVA, and seizure disorder who presents from [facility] with AMS and hypotension with UTI with septic shock, multiorgan failure  Pt remains on vent support via trach; +fevers Noted pt has been seen by palliative care; family wanting continued full code and aggressive care  Toleratting Pivot 1.5 at 60 ml/hr via G-tube, Juven BID  Severe hypernatremia; free water 250 mL q 6 hours and D5 at 75 ml/hr per MD  Current wt 76.7 kg; admission weight 72.7 kg. Net + per I/O flow sheet  Pt with liquid stool via rectal tube  Labs: sodium 163 (H), >130 (H), Creatinine wdl Meds: D5 at 75 ml/hr, ss novolog, MVI, Juven BID, zinc sulfate, K phos neutral, IV phenytoin    Diet Order:   Diet Order            Diet NPO time specified  Diet effective now              EDUCATION NEEDS:    No education needs have been identified at this time  Skin:  Skin Assessment: Skin Integrity Issues: Skin Integrity Issues:: Stage III, Stage IV, Unstageable Stage III: sacrum Stage IV: thoracic spine Unstageable: B/L heel, R. forearm  Last BM:  7/15 rectal tube  Height:   Ht Readings from Last 1 Encounters:  11/12/18 5\' 7"  (1.702 m)    Weight:   Wt Readings from Last 1 Encounters:  11/22/18 76.7 kg    Ideal Body Weight:  61.4 kg  BMI:  Body mass index is 26.48 kg/m.  Estimated Nutritional Needs:   Kcal:  1950-2150 kcals  Protein:  120-145 g  Fluid:  >/= 2 L   Cate Henderson Frampton MS, RDN, LDN, CNSC [phone] Pager  [phone] Weekend/On-Call Pager

## 2018-11-22 NOTE — Progress Notes (Signed)
PROGRESS NOTE    Melissa Cochran  IWO:[id number] DOB: 21-Mar-1936 DOA: 11/11/2018 PCP: [name], MD    Brief Narrative:  83 year old female who presented with sepsis. She does have significant past medical history for chronic hypoxic respiratory failure, ventilator dependent. Coronary aretery disease, heart failure, paroxysmal atrial fibrillation, COPD and CVA. She also has seizures,DVT,GERD and hypertension.  She is a resident from [facility], she was transferred due to acute on chronic encephalopathy and hypotension related to urinary tract infection. She does have a poor baseline, only tracking with her eyes. On her initial physical examination her blood pressure was 90/57, pulse rate 102, temperature 38.3 C, respiratory rate 24 oxygen saturation 98%.She was unresponsive, trach in place, scattered rhonchi bilaterally, heart S1-S2 present regular, abdomen soft, no lower extremity edema.    Sodium 130, potassium 5.1, chloride 98, bicarb 13, glucose 125, BUN 57, creatinine 1.68, white count 45.6, hemoglobin 9.7, hematocrit 39.3, platelets 387.  SARS COVID-19 negative.  Urinalysis had specific gravity 1.020, few bacteria, moderate leukocytes.  Stool for C. difficile was negative.  Her chest radiograph had bibasilar atelectasis, CT of the chest and abdomen no acute changes.  EKG 106 bpm, normal axis, normal intervals (corrected QTC 432), septal Q waves, no significant ST-T segment or T wave changes.   Patient was admitted with a working diagnosis of sepsis due to urinary tract infection.  Patient developed shock due to gram-negative bacteremia, she required vasopressors and broad-spectrum antibiotic therapy.    Assessment & Plan:   Principal Problem:   Fever Active Problems:   Chronic respiratory failure (HCC)   Sepsis (HCC)   Bacteremia due to Klebsiella pneumoniae   UTI (urinary tract infection)   Pressure injury of skin   1.  Septic shock due to gram-negative  bacteremia/Klebsiella, urinary tract infection, present on admission.  Patient was admitted to the intensive care unit, she received broad spectrum IV antibiotic therapy, vancomycin, meropenem, ceftazidime-avabactam.  Blood cultures and urine culture grew Klebsiella pneumonia.  Patient responded well to medical therapy, regained hemodynamic stability, vasopressors were discontinued.  Patient has with positive fevers overnight, T max 38.5 , patient has been started on eracycline per ID, will continue to follow recommendations.   2.  Chronic systolic heart failure.  No signs of acute exacerbation. Continue with midodrine.   3.  Paroxysmal atrial fibrillation.  Continue rate control with metoprolol, continue aspirin for antiplatelet therapy.   4.  Ventilator dependent respiratory failure, hypercapnic and hypoxic, acute on chronic.  Patient was continued on mechanical ventilation with good toleration, no significant dyssynchrony.  Patient on PC mode, with pressure of 27, peep 5 Fi02 30%, and respiratory rate of 20, generating Vt 450 ml. No significant dyssynchrony, patient   5.  Hyponatremia/ hypernatremia.  Patient received IV fluids with improvement of electrolytes. Noted elevated Na and Cl, will repeat sample, water flushes have been increased.   6.  Seizures.  No active seizure activity, continue, valproic acid Keppra and phenytoin.  7.  COPD.  No signs of acute exacerbation.  8.  Chronic anemia.  Likely multifactorial, continue monitoring cell count.  Transfusion for hemoglobin less than 7.  9.  Chronic encephalopathy.  Patient with very poor prognosis, at baseline only tracks with her eyes.  Continue supportive care, palliative care services were consulted, family would like to continue aggressive care, continue full code.  10.  Multiple pressure ulcers, right heel (unstageable), sacrum (stage III), vertebral column (stage III), left foot (unstageable)/ present on admission.  Continue local wound care.  11.  Type 2 diabetes mellitus.  Continue insulin therapy, sliding scale, for glucose coverage and monitoring.  Continue tube feeds.    DVT prophylaxis: scd   Code Status: full Family Communication: no family at the bedside  Disposition Plan/ discharge barriers: pending clinical improvement.   Body mass index is 26.48 kg/m. Malnutrition Type:  Nutrition Problem: Increased nutrient needs Etiology: acute illness, chronic illness, wound healing   Malnutrition Characteristics:  Signs/Symptoms: estimated needs   Nutrition Interventions:  Interventions: Juven, Tube feeding  RN Pressure Injury Documentation: Pressure Injury 11/19/18 Heel Left Unstageable - Full thickness tissue loss in which the base of the ulcer is covered by slough (yellow, tan, gray, green or brown) and/or eschar (tan, brown or black) in the wound bed. (Active)  11/19/18 0800  Location: Heel  Location Orientation: Left  Staging: Unstageable - Full thickness tissue loss in which the base of the ulcer is covered by slough (yellow, tan, gray, green or brown) and/or eschar (tan, brown or black) in the wound bed.  Wound Description (Comments):   Present on Admission:      Pressure Injury 11/19/18 Heel Right Unstageable - Full thickness tissue loss in which the base of the ulcer is covered by slough (yellow, tan, gray, green or brown) and/or eschar (tan, brown or black) in the wound bed. (Active)  11/19/18 0800  Location: Heel  Location Orientation: Right  Staging: Unstageable - Full thickness tissue loss in which the base of the ulcer is covered by slough (yellow, tan, gray, green or brown) and/or eschar (tan, brown or black) in the wound bed.  Wound Description (Comments):   Present on Admission:      Pressure Injury 11/19/18 Sacrum Mid Stage III -  Full thickness tissue loss. Subcutaneous fat may be visible but bone, tendon or muscle are NOT exposed. (Active)  11/19/18 0800    Location: Sacrum  Location Orientation: Mid  Staging: Stage III -  Full thickness tissue loss. Subcutaneous fat may be visible but bone, tendon or muscle are NOT exposed.  Wound Description (Comments):   Present on Admission:      Pressure Injury 11/19/18 Vertebral column Mid;Medial Stage III -  Full thickness tissue loss. Subcutaneous fat may be visible but bone, tendon or muscle are NOT exposed. (Active)  11/19/18 0800  Location: Vertebral column  Location Orientation: Mid;Medial  Staging: Stage III -  Full thickness tissue loss. Subcutaneous fat may be visible but bone, tendon or muscle are NOT exposed.  Wound Description (Comments):   Present on Admission:      Pressure Injury 11/19/18 Foot Left;Lateral Unstageable - Full thickness tissue loss in which the base of the ulcer is covered by slough (yellow, tan, gray, green or brown) and/or eschar (tan, brown or black) in the wound bed. (Active)  11/19/18 0800  Location: Foot  Location Orientation: Left;Lateral  Staging: Unstageable - Full thickness tissue loss in which the base of the ulcer is covered by slough (yellow, tan, gray, green or brown) and/or eschar (tan, brown or black) in the wound bed.  Wound Description (Comments):   Present on Admission:      Consultants:   ID   Procedures:     Antimicrobials:    eravacycline    Subjective: Patient not responsive, she has been grimacing due to pain, positive fever overnight, started on antibiotic therapy per ID.   Objective: Vitals:   11/22/18 0510 11/22/18 0600 11/22/18 0710 11/22/18 0800  BP:  116/62 Marland Kitchen)  110/55   Pulse:  (!) 110 (!) 118   Resp:  (!) 28 (!) 30   Temp: (!) 100.6 F (38.1 C)   98.7 F (37.1 C)  TempSrc: Oral   Axillary  SpO2:  99% 99%   Weight:      Height:        Intake/Output Summary (Last 24 hours) at 11/22/2018 0935 Last data filed at 11/22/2018 0600 Gross per 24 hour  Intake 1761.23 ml  Output 1675 ml  Net 86.23 ml   Filed Weights    11/20/18 0419 11/21/18 0322 11/22/18 0354  Weight: 75.9 kg 76 kg 76.7 kg    Examination:   General: deconditioned and ill looking appearing  Neurology: eyes closed not responsive to voice or touch. Has been grimacing per nursing.  E ENT: no pallor, no icterus, oral mucosa moist/ trach in place.  Cardiovascular: No JVD. S1-S2 present, rhythmic, no gallops, rubs, or murmurs. No lower extremity edema. Pulmonary: positve breath sounds bilaterally, adequate air movement, no wheezing, rhonchi or rales. Gastrointestinal. Abdomen mild distended, no organomegaly, non tender, no rebound or guarding Skin. No rashes Musculoskeletal: no joint deformities     Data Reviewed: I have personally reviewed following labs and imaging studies  CBC: Recent Labs  Lab 11/17/18 0402 11/19/18 0430 11/20/18 0404 11/22/18 0404  WBC 9.6 12.4* 14.7* 11.3*  NEUTROABS  --   --   --  8.7*  HGB 8.6* 7.7* 7.6* 7.3*  HCT 30.7* 27.6* 27.0* 26.8*  MCV 100.7* 102.6* 103.1* 106.3*  PLT 232 271 287 536   Basic Metabolic Panel: Recent Labs  Lab 11/16/18 0459 11/17/18 0402 11/18/18 0435 11/19/18 0430 11/20/18 0404 11/22/18 0404  NA 160* 158* 155* 152* 152* 161*  K 4.1 2.9* 3.2* 3.9 4.0 3.3*  CL >130* 129* 126* 123* 121* >130*  CO2 18* 19* 19* 21* 21* 22  GLUCOSE 147* 127* 136* 118* 117* 119*  BUN 81* 79* 74* 64* 61* 57*  CREATININE 0.65 0.68 0.61   0.53 0.55 0.50 0.53  CALCIUM 9.2 8.3* 8.4* 8.5* 8.7* 8.8*  PHOS <1.0* 2.5  --   --   --   --    GFR: Estimated Creatinine Clearance: 58.9 mL/min (by C-G formula based on SCr of 0.53 mg/dL). Liver Function Tests: Recent Labs  Lab 11/16/18 1440 11/17/18 0402  AST 118*  --   ALT 424*  --   ALKPHOS 144*  --   BILITOT 1.2  --   PROT 5.7*  --   ALBUMIN 1.8* 1.8*   No results for input(s): LIPASE, AMYLASE in the last 168 hours. No results for input(s): AMMONIA in the last 168 hours. Coagulation Profile: No results for input(s): INR, PROTIME in the last  168 hours. Cardiac Enzymes: No results for input(s): CKTOTAL, CKMB, CKMBINDEX, TROPONINI in the last 168 hours. BNP (last 3 results) No results for input(s): PROBNP in the last 8760 hours. HbA1C: No results for input(s): HGBA1C in the last 72 hours. CBG: Recent Labs  Lab 11/21/18 1546 11/21/18 1918 11/21/18 2335 11/22/18 0326 11/22/18 0843  GLUCAP 93 112* 131* 114* 118*   Lipid Profile: No results for input(s): CHOL, HDL, LDLCALC, TRIG, CHOLHDL, LDLDIRECT in the last 72 hours. Thyroid Function Tests: No results for input(s): TSH, T4TOTAL, FREET4, T3FREE, THYROIDAB in the last 72 hours. Anemia Panel: No results for input(s): VITAMINB12, FOLATE, FERRITIN, TIBC, IRON, RETICCTPCT in the last 72 hours.    Radiology Studies: I have reviewed all of the imaging during this  hospital visit personally     Scheduled Meds:  acetaminophen (TYLENOL) oral liquid 160 mg/5 mL  1,000 mg Per Tube TID   aspirin  81 mg Per Tube Daily   chlorhexidine gluconate (MEDLINE KIT)  15 mL Mouth Rinse BID   Chlorhexidine Gluconate Cloth  6 each Topical Daily   free water  300 mL Per Tube Q2H   insulin aspart  0-9 Units Subcutaneous Q4H   levETIRAcetam  1,000 mg Per Tube BID   mouth rinse  15 mL Mouth Rinse 10 times per day   metoprolol tartrate  12.5 mg Per Tube BID   midodrine  5 mg Per Tube TID WC   multivitamin  15 mL Per Tube Daily   nutrition supplement (JUVEN)  1 packet Per Tube BID   pantoprazole sodium  40 mg Per Tube Daily   phosphorus  500 mg Per Tube BID   valproic acid  250 mg Per Tube BID   zinc sulfate  220 mg Per Tube Daily   Continuous Infusions:  sodium chloride Stopped (11/22/18 0446)   eravacycline     feeding supplement (PIVOT 1.5 CAL) 1,000 mL (11/21/18 1627)   norepinephrine (LEVOPHED) Adult infusion Stopped (11/14/18 0057)   phenytoin (DILANTIN) IV Stopped (11/21/18 2205)     LOS: 10 days        Brayten Komar Gerome Apley, MD

## 2018-11-22 NOTE — Progress Notes (Signed)
[place] Progress Note Patient Name: Melissa Cochran DOB: March 13, 1936 MRN: [id number]   Date of Service  11/22/2018  HPI/Events of Note  Serum sodium 161, chloride > 130  eICU Interventions  Free water order changed to 300 ml Q 2 hours x  2 days        Frederik Pear 11/22/2018, 5:05 AM

## 2018-11-22 NOTE — Progress Notes (Signed)
Phenytoin Dosing CONSULT NOTE  Pharmacy Consult for IV Phenytoin  Indication: Seizure disorder   No Known Allergies  Vital Signs: Temp: 98.7 F (37.1 C) (07/15 0800) Temp Source: Axillary (07/15 0800) BP: 114/51 (07/15 1130) Pulse Rate: 123 (07/15 1130) Intake/Output from previous day: 07/14 0701 - 07/15 0700 In: 1901.2 [I.V.:212.1; NG/GT:1380; IV Piggyback:309.2] Out: 3143 [Urine:1100; Stool:575] Intake/Output from this shift: No intake/output data recorded.  Labs: Recent Labs    11/20/18 0404 11/22/18 0404 11/22/18 0930  WBC 14.7* 11.3*  --   HGB 7.6* 7.3*  --   HCT 27.0* 26.8*  --   PLT 287 298  --   CREATININE 0.50 0.53 0.53   Estimated Creatinine Clearance: 58.9 mL/min (by C-G formula based on SCr of 0.53 mg/dL).   Assessment: Pt on IV phenytoin 130 mg TID Scr 0.53  (baseline ~0.3-0.4), albumin is 1.8, phenytoin level <2.5 drawn appropriately. Per initial dosing consult note and PTA medication list it looks like the patient was taking 500 mg BID of phenytoin per tube at her facility. We will load her now to get her level up in the short term and continue to slowly increase her maintenance dose based on levels.  Goal of Therapy:  Phenytoin level 10-20 mg/L adjusting for low albumin   Plan:  Load patient with 800 mg IV  Increase IV phenytoin to 160 mg TID Re-check levels in 5 to 7 days   Nicoletta Dress, PharmD PGY2 Infectious Disease Pharmacy Resident

## 2018-11-22 NOTE — Progress Notes (Signed)
CRITICAL VALUE ALERT  Critical Value: Na 161; Chloride >130  Date & Time Notied: 7/15   0500  Provider Notified: Dr. Lucile Shutters via [place] RN  Orders Received/Actions taken: awaiting orders

## 2018-11-22 NOTE — Progress Notes (Signed)
Patient ID: Melissa Cochran, female   DOB: 19-Jul-1935, 83 y.o.   MRN: [id number]         [facility] for Infectious Disease  Date of Admission:  11/11/2018            ASSESSMENT: She continues to do very poorly and I think the chance that she will improve and recover is essentially nil.  However, her family want [place] to continue aggressive medical care.  She remains febrile.  Recent chest x-ray showed fairly stable bibasilar opacities.  Repeat blood cultures and C. difficile screen are negative.  Her last sputum culture grew stenotrophomonas and a multidrug-resistant Acinetobacter.  Repeat sputum culture is pending.  I will start eravacycline now.  PLAN: 1. Start eravacycline now pending further observation and results of repeat sputum cultures  Principal Problem:   Fever Active Problems:   Bacteremia due to Klebsiella pneumoniae   Chronic respiratory failure (HCC)   Sepsis ([place])   UTI (urinary tract infection)   Pressure injury of skin   Scheduled Meds: . acetaminophen (TYLENOL) oral liquid 160 mg/5 mL  1,000 mg Per Tube TID  . aspirin  81 mg Per Tube Daily  . chlorhexidine gluconate (MEDLINE KIT)  15 mL Mouth Rinse BID  . Chlorhexidine Gluconate Cloth  6 each Topical Daily  . free water  250 mL Per Tube Q6H  . insulin aspart  0-9 Units Subcutaneous Q4H  . levETIRAcetam  1,000 mg Per Tube BID  . mouth rinse  15 mL Mouth Rinse 10 times per day  . metoprolol tartrate  12.5 mg Per Tube BID  . midodrine  5 mg Per Tube TID WC  . multivitamin  15 mL Per Tube Daily  . nutrition supplement (JUVEN)  1 packet Per Tube BID  . pantoprazole sodium  40 mg Per Tube Daily  . phosphorus  500 mg Per Tube BID  . valproic acid  250 mg Per Tube BID  . zinc sulfate  220 mg Per Tube Daily   Continuous Infusions: . sodium chloride Stopped (11/22/18 0446)  . dextrose 75 mL/hr at 11/22/18 1122  . eravacycline 115 mg (11/22/18 1106)  . feeding supplement (PIVOT 1.5 CAL) 1,000 mL (11/22/18  1152)  . phenytoin (DILANTIN) IV Stopped (11/21/18 2205)   PRN Meds:.sodium chloride, acetaminophen (TYLENOL) oral liquid 160 mg/5 mL, bisacodyl, fentaNYL (SUBLIMAZE) injection, ipratropium-albuterol, loperamide, sodium chloride flush, traMADol   Review of Systems: Review of Systems  Unable to perform ROS: Mental acuity    No Known Allergies  OBJECTIVE: Vitals:   11/22/18 0800 11/22/18 0900 11/22/18 1000 11/22/18 1130  BP: (!) 97/56 (!) 114/59 122/61 (!) 114/51  Pulse: (!) 121 (!) 124 (!) 126 (!) 123  Resp: (!) 26 (!) 25 (!) 27 (!) 32  Temp: 98.7 F (37.1 C)     TempSrc: Axillary     SpO2: 100% 100% 99% 100%  Weight:      Height:       Body mass index is 26.48 kg/m.  Physical Exam Constitutional:      Comments: She remains unresponsive on the ventilator.  Cardiovascular:     Rate and Rhythm: Normal rate and regular rhythm.     Heart sounds: No murmur.  Pulmonary:     Breath sounds: Normal breath sounds.  Abdominal:     Palpations: Abdomen is soft.  Skin:    Findings: No rash.     Lab Results Lab Results  Component Value Date   WBC 11.3 (H)  11/22/2018   HGB 7.3 (L) 11/22/2018   HCT 26.8 (L) 11/22/2018   MCV 106.3 (H) 11/22/2018   PLT 298 11/22/2018    Lab Results  Component Value Date   CREATININE 0.53 11/22/2018   BUN 55 (H) 11/22/2018   NA 163 (HH) 11/22/2018   K 3.7 11/22/2018   CL >130 (HH) 11/22/2018   CO2 22 11/22/2018    Lab Results  Component Value Date   ALT 424 (H) 11/16/2018   AST 118 (H) 11/16/2018   ALKPHOS 144 (H) 11/16/2018   BILITOT 1.2 11/16/2018     Microbiology: Recent Results (from the past 240 hour(s))  MRSA PCR Screening     Status: None   Collection Time: 11/14/18  9:19 AM   Specimen: Nasal Mucosa; Nasopharyngeal  Result Value Ref Range Status   MRSA by PCR NEGATIVE NEGATIVE Final    Comment:        The GeneXpert MRSA Assay (FDA approved for NASAL specimens only), is one component of a comprehensive MRSA  colonization surveillance program. It is not intended to diagnose MRSA infection nor to guide or monitor treatment for MRSA infections. Performed at [facility], [place] [address], [place], [place] [address]   Culture, blood (routine x 2)     Status: None (Preliminary result)   Collection Time: 11/19/18  2:16 PM   Specimen: BLOOD RIGHT HAND  Result Value Ref Range Status   Specimen Description BLOOD RIGHT HAND  Final   Special Requests   Final    BOTTLES DRAWN AEROBIC ONLY Blood Culture adequate volume   Culture   Final    NO GROWTH 2 DAYS Performed at [facility], [place] [address] [place], [place] [address]    Report Status PENDING  Incomplete  Culture, blood (routine x 2)     Status: None (Preliminary result)   Collection Time: 11/19/18  2:27 PM   Specimen: BLOOD RIGHT HAND  Result Value Ref Range Status   Specimen Description BLOOD RIGHT HAND  Final   Special Requests   Final    BOTTLES DRAWN AEROBIC ONLY Blood Culture adequate volume   Culture   Final    NO GROWTH 2 DAYS Performed at [facility], [place] [address] [place], [place] [address]    Report Status PENDING  Incomplete  C difficile quick scan w PCR reflex     Status: None   Collection Time: 11/20/18  2:30 PM   Specimen: STOOL  Result Value Ref Range Status   C Diff antigen NEGATIVE NEGATIVE Final   C Diff toxin NEGATIVE NEGATIVE Final   C Diff interpretation No C. difficile detected.  Final    Comment: Performed at [facility], [place] [address] [place], [place] [address]  Culture, respiratory (non-expectorated)     Status: None (Preliminary result)   Collection Time: 11/21/18  8:46 AM   Specimen: Tracheal Aspirate; Respiratory  Result Value Ref Range Status   Specimen Description TRACHEAL ASPIRATE  Final   Special Requests Normal  Final   Gram Stain   Final    MODERATE WBC PRESENT, PREDOMINANTLY PMN MODERATE GRAM POSITIVE RODS    Culture   Final    CULTURE REINCUBATED FOR BETTER  GROWTH Performed at [facility], [address] [place],  [address]    Report Status PENDING  Incomplete    Michel Bickers, MD [facility] for Infectious [place] [facility] [phone] pager  [phone] cell 11/22/2018, 12:42 PM

## 2018-11-23 DIAGNOSIS — D72829 Elevated white blood cell count, unspecified: Secondary | ICD-10-CM

## 2018-11-23 DIAGNOSIS — Z1624 Resistance to multiple antibiotics: Secondary | ICD-10-CM

## 2018-11-23 DIAGNOSIS — J158 Pneumonia due to other specified bacteria: Secondary | ICD-10-CM

## 2018-11-23 LAB — BASIC METABOLIC PANEL
Anion gap: 10 (ref 5–15)
BUN: 56 mg/dL — ABNORMAL HIGH (ref 8–23)
CO2: 19 mmol/L — ABNORMAL LOW (ref 22–32)
Calcium: 8.4 mg/dL — ABNORMAL LOW (ref 8.9–10.3)
Chloride: 125 mmol/L — ABNORMAL HIGH (ref 98–111)
Creatinine, Ser: 0.52 mg/dL (ref 0.44–1.00)
GFR calc Af Amer: >60 mL/min (ref >60)
GFR calc non Af Amer: >60 mL/min (ref >60)
Glucose, Bld: 203 mg/dL — ABNORMAL HIGH (ref 70–99)
Potassium: 3.4 mmol/L — ABNORMAL LOW (ref 3.5–5.1)
Sodium: 154 mmol/L — ABNORMAL HIGH (ref 135–145)

## 2018-11-23 LAB — CBC WITH DIFFERENTIAL/PLATELET
Abs Immature Granulocytes: 0.17 10*3/uL — ABNORMAL HIGH (ref 0.00–0.07)
Basophils Absolute: 0.1 10*3/uL (ref 0.0–0.1)
Basophils Relative: 1 %
Eosinophils Absolute: 0.3 10*3/uL (ref 0.0–0.5)
Eosinophils Relative: 2 %
HCT: 28.1 % — ABNORMAL LOW (ref 36.0–46.0)
Hemoglobin: 7.5 g/dL — ABNORMAL LOW (ref 12.0–15.0)
Immature Granulocytes: 1 %
Lymphocytes Relative: 15 %
Lymphs Abs: 2 10*3/uL (ref 0.7–4.0)
MCH: 28.8 pg (ref 26.0–34.0)
MCHC: 26.7 g/dL — ABNORMAL LOW (ref 30.0–36.0)
MCV: 108.1 fL — ABNORMAL HIGH (ref 80.0–100.0)
Monocytes Absolute: 0.4 10*3/uL (ref 0.1–1.0)
Monocytes Relative: 3 %
Neutro Abs: 10.3 10*3/uL — ABNORMAL HIGH (ref 1.7–7.7)
Neutrophils Relative %: 78 %
Platelets: 300 10*3/uL (ref 150–400)
RBC: 2.6 MIL/uL — ABNORMAL LOW (ref 3.87–5.11)
RDW: 18.9 % — ABNORMAL HIGH (ref 11.5–15.5)
WBC: 13.2 10*3/uL — ABNORMAL HIGH (ref 4.0–10.5)
nRBC: 0.5 % — ABNORMAL HIGH (ref 0.0–0.2)

## 2018-11-23 LAB — GLUCOSE, CAPILLARY
Glucose-Capillary: 110 mg/dL — ABNORMAL HIGH (ref 70–99)
Glucose-Capillary: 113 mg/dL — ABNORMAL HIGH (ref 70–99)
Glucose-Capillary: 138 mg/dL — ABNORMAL HIGH (ref 70–99)
Glucose-Capillary: 96 mg/dL (ref 70–99)
Glucose-Capillary: 100 mg/dL — ABNORMAL HIGH (ref 70–99)

## 2018-11-23 MED ORDER — FREE WATER
300.0000 mL | Freq: Four times a day (QID) | Status: DC
  Administered 2018-11-23 – 2018-11-24 (×5): 300 mL

## 2018-11-23 NOTE — Progress Notes (Signed)
[facility] for Infectious Disease   Reason for visit: Follow up on MDR infection  Interval History: remains unresponsive, remains febrile, WBC up to 13.2.  No acute events otherwise.   Physical Exam: Constitutional:  Vitals:   11/23/18 0700 11/23/18 0803  BP: (!) 112/57 (!) 107/55  Pulse: (!) 112 (!) 103  Resp: (!) 27 (!) 27  Temp: 99.6 F (37.6 C)   SpO2: 100% 100%   patient is unresponsive Eyes: anicteric HENT: + trach Respiratory: respiratory effort on vent; CTA, anterior exam Cardiovascular: RRR GI: soft, nt, nd  Review of Systems: Unable to be assessed due to mental status  Lab Results  Component Value Date   WBC 13.2 (H) 11/23/2018   HGB 7.5 (L) 11/23/2018   HCT 28.1 (L) 11/23/2018   MCV 108.1 (H) 11/23/2018   PLT 300 11/23/2018    Lab Results  Component Value Date   CREATININE 0.52 11/23/2018   BUN 56 (H) 11/23/2018   NA 154 (H) 11/23/2018   K 3.4 (L) 11/23/2018   CL 125 (H) 11/23/2018   CO2 19 (L) 11/23/2018    Lab Results  Component Value Date   ALT 424 (H) 11/16/2018   AST 118 (H) 11/16/2018   ALKPHOS 144 (H) 11/16/2018     Microbiology: Recent Results (from the past 240 hour(s))  MRSA PCR Screening     Status: None   Collection Time: 11/14/18  9:19 AM   Specimen: Nasal Mucosa; Nasopharyngeal  Result Value Ref Range Status   MRSA by PCR NEGATIVE NEGATIVE Final    Comment:        The GeneXpert MRSA Assay (FDA approved for NASAL specimens only), is one component of a comprehensive MRSA colonization surveillance program. It is not intended to diagnose MRSA infection nor to guide or monitor treatment for MRSA infections. Performed at [facility], [place] [address] [place], [place] [address]   Culture, blood (routine x 2)     Status: None (Preliminary result)   Collection Time: 11/19/18  2:16 PM   Specimen: BLOOD RIGHT HAND  Result Value Ref Range Status   Specimen Description BLOOD RIGHT HAND  Final   Special Requests    Final    BOTTLES DRAWN AEROBIC ONLY Blood Culture adequate volume   Culture   Final    NO GROWTH 3 DAYS Performed at [facility], [place] [address], [place], [place] [address]    Report Status PENDING  Incomplete  Culture, blood (routine x 2)     Status: None (Preliminary result)   Collection Time: 11/19/18  2:27 PM   Specimen: BLOOD RIGHT HAND  Result Value Ref Range Status   Specimen Description BLOOD RIGHT HAND  Final   Special Requests   Final    BOTTLES DRAWN AEROBIC ONLY Blood Culture adequate volume   Culture   Final    NO GROWTH 3 DAYS Performed at [facility], [place] [address] [place], [place] [address]    Report Status PENDING  Incomplete  C difficile quick scan w PCR reflex     Status: None   Collection Time: 11/20/18  2:30 PM   Specimen: STOOL  Result Value Ref Range Status   C Diff antigen NEGATIVE NEGATIVE Final   C Diff toxin NEGATIVE NEGATIVE Final   C Diff interpretation No C. difficile detected.  Final    Comment: Performed at [facility], [place] [address], [place],  [address]  Culture, respiratory (  non-expectorated)     Status: None (Preliminary result)   Collection Time: 11/21/18  8:46 AM   Specimen: Tracheal Aspirate; Respiratory  Result Value Ref Range Status   Specimen Description TRACHEAL ASPIRATE  Final   Special Requests Normal  Final   Gram Stain   Final    MODERATE WBC PRESENT, PREDOMINANTLY PMN MODERATE GRAM POSITIVE RODS    Culture   Final    FEW STENOTROPHOMONAS MALTOPHILIA SUSCEPTIBILITIES TO FOLLOW Performed at [facility], [place] [address], [place], [place] [address]    Report Status PENDING  Incomplete    Impression/Plan:  1. MDR pneumonia - possible though her respiratory status has been stable.  On eravacycline.  Has grown out Stenotrophomonas and MDR acinetobacter.   Will continue.  2.  Fever - has remained febrile, likely from #1 or unknown cause.  On antibiotics as above.   3.  Luekocytosis - a bit  up today.  Will continue to monitor.

## 2018-11-23 NOTE — Progress Notes (Addendum)
PROGRESS NOTE    Melissa Cochran  MAU:[id number] DOB: April 11, 1936 DOA: 11/11/2018 PCP: [name], MD    Brief Narrative:  83 year old female who presented with sepsis. She does have significant past medical history for chronic hypoxic respiratory failure, ventilator dependent., coronaryareterydisease, heart failure, paroxysmal atrialfibrillation, COPD and CVA. She also has seizures,DVT,GERD and hypertension.  She is a resident from [facility], she was transferred due to acute on chronic encephalopathy and hypotension related to urinary tract infection. She does have a poor baseline,onlytracking with her eyes. On her initial physical examination her blood pressure was 90/57, pulse rate 102, temperature 38.3 C, respiratory rate 24 oxygen saturation 98%.She was unresponsive, trach in place, scattered rhonchi bilaterally, heart S1-S2 present regular, abdomen soft, no lower extremity edema.  Sodium 130, potassium 5.1, chloride 98, bicarb 13, glucose 125, BUN 57, creatinine 1.68, white count 45.6, hemoglobin 9.7, hematocrit 39.3, platelets 387.SARS COVID-19 negative. Urinalysis had specific gravity 1.020, few bacteria, moderate leukocytes. Stool for C. difficile was negative. Her chest radiograph had bibasilar atelectasis, CT of the chest and abdomen no acute changes.EKG 106 bpm, normal axis, normal intervals (corrected QTC 432),septal Q waves, no significant ST-T segment or T wave changes.  Patient was admitted with a working diagnosis of sepsis due to urinary tract infection.  Patient developed shock due to gram-negative bacteremia, Klebsiella Pneumonia, she required vasopressors and broad-spectrum antibiotic therapy.  Persistent fevers, due to tracheobronchitis-ventilator associated pneumonia (not present on admission) due to stenotrophomonas maltophilia and MDR acinetobacter.   Assessment & Plan:   Principal Problem:   Fever Active Problems:   Chronic  respiratory failure (HCC)   Sepsis (HCC)   Bacteremia due to Klebsiella pneumoniae   UTI (urinary tract infection)   Pressure injury of skin   1.Septic shock due to gram-negative bacteremia/Klebsiella, urinary tract infection, present on admission. Urine infection now has completed treatment.   2. Ventilator associated pneumonia/ no present on admission. Cultures positive for Stenotrophomonas and MDR acinetobacter, continue with eracycline per ID. Follow on cell count and temperature curve.  2.Chronic systolic heart failure. Continue with midodrine.   3.Paroxysmal atrial fibrillation.On metoprolol for rate control and asa for antiplatelet therapy.   4.Ventilator dependent respiratory failure, hypercapnic and hypoxic, acute on chronic.Continue on mechanical ventilation  Patient on PC mode, with pressure of 27, peep 5 Fi02 30%, and respiratory rate of 20, generating Vt 450 ml. No significant dyssynchrony, patient   Patient has chronic ventilator dependent respiratory failure.   5.Hyponatremia/ hypernatremia/ hyperchloremic non anion gap metabolic acidosis. Na has been trending down, this am down to 154 with cl at 125, serum bicarbonate at 19. Will continue d5 water at 75 ml per H and will increase free water flushes at 300 q 6 H. Follow on renal panel in am.   6.Seizures.ON valproic acid Keppra and phenytoin.  7.COPD.No acute exacerbation.  8.Chronic anemia.Stable cell counts.  9.Chronic encephalopathy.Stable, patient continue to be unresponsive, need to contact her family for goals of care.  10.Multiple pressure ulcers,right heel (unstageable), sacrum (stage III), vertebral column (stage III), left foot (unstageable)/ present on admission.Local wound care per protocol.  11.Type 2 diabetes mellitus.On insulin therapy, sliding scale, for glucose coverage and monitoring. Tolerating well tube feeds.   DVT prophylaxis: scd   Code  Status: full Family Communication: no family at the bedside  Disposition Plan/ discharge barriers: pending clinical improvement.   Body mass index is 27.24 kg/m. Malnutrition Type:  Nutrition Problem: Increased nutrient needs Etiology: acute illness, chronic illness, wound healing  Malnutrition Characteristics:  Signs/Symptoms: estimated needs   Nutrition Interventions:  Interventions: Juven, Tube feeding  RN Pressure Injury Documentation: Pressure Injury 11/19/18 Heel Left Unstageable - Full thickness tissue loss in which the base of the ulcer is covered by slough (yellow, tan, gray, green or brown) and/or eschar (tan, brown or black) in the wound bed. (Active)  11/19/18 0800  Location: Heel  Location Orientation: Left  Staging: Unstageable - Full thickness tissue loss in which the base of the ulcer is covered by slough (yellow, tan, gray, green or brown) and/or eschar (tan, brown or black) in the wound bed.  Wound Description (Comments):   Present on Admission:      Pressure Injury 11/19/18 Heel Right Unstageable - Full thickness tissue loss in which the base of the ulcer is covered by slough (yellow, tan, gray, green or brown) and/or eschar (tan, brown or black) in the wound bed. (Active)  11/19/18 0800  Location: Heel  Location Orientation: Right  Staging: Unstageable - Full thickness tissue loss in which the base of the ulcer is covered by slough (yellow, tan, gray, green or brown) and/or eschar (tan, brown or black) in the wound bed.  Wound Description (Comments):   Present on Admission:      Pressure Injury 11/19/18 Sacrum Mid Stage III -  Full thickness tissue loss. Subcutaneous fat may be visible but bone, tendon or muscle are NOT exposed. (Active)  11/19/18 0800  Location: Sacrum  Location Orientation: Mid  Staging: Stage III -  Full thickness tissue loss. Subcutaneous fat may be visible but bone, tendon or muscle are NOT exposed.  Wound Description (Comments):    Present on Admission:      Pressure Injury 11/19/18 Vertebral column Mid;Medial Stage III -  Full thickness tissue loss. Subcutaneous fat may be visible but bone, tendon or muscle are NOT exposed. (Active)  11/19/18 0800  Location: Vertebral column  Location Orientation: Mid;Medial  Staging: Stage III -  Full thickness tissue loss. Subcutaneous fat may be visible but bone, tendon or muscle are NOT exposed.  Wound Description (Comments):   Present on Admission:      Pressure Injury 11/19/18 Foot Left;Lateral Unstageable - Full thickness tissue loss in which the base of the ulcer is covered by slough (yellow, tan, gray, green or brown) and/or eschar (tan, brown or black) in the wound bed. (Active)  11/19/18 0800  Location: Foot  Location Orientation: Left;Lateral  Staging: Unstageable - Full thickness tissue loss in which the base of the ulcer is covered by slough (yellow, tan, gray, green or brown) and/or eschar (tan, brown or black) in the wound bed.  Wound Description (Comments):   Present on Admission:      Consultants:   ID   Procedures:     Antimicrobials:    eracycline    Subjective: Patient is not responsive, grimacing with passive movement of left upper extremity. Non verbal. Tolerating tube feedings.   Objective: Vitals:   11/23/18 0800 11/23/18 0803 11/23/18 0900 11/23/18 1000  BP: (!) 107/55 (!) 107/55 (!) 120/59 (!) 97/48  Pulse: (!) 110 (!) 103 (!) 115 98  Resp: (!) 22 (!) 27 (!) 36 (!) 29  Temp:      TempSrc:      SpO2: 100% 100% 98% 100%  Weight:      Height:        Intake/Output Summary (Last 24 hours) at 11/23/2018 1101 Last data filed at 11/23/2018 1000 Gross per 24 hour  Intake 5115.6 ml  Output 1620 ml  Net 3495.6 ml   Filed Weights   11/21/18 0322 11/22/18 0354 11/23/18 0333  Weight: 76 kg 76.7 kg 78.9 kg    Examination:   General: deconditioned  Neurology: not responsive.  E ENT: positive pallor, no icterus, oral mucosa moist,  trach in place.  Cardiovascular: No JVD. S1-S2 present, rhythmic, no gallops, rubs, or murmurs. ++ dependent pitting edema. Pulmonary: positive breath sounds bilaterally, adequate air movement, no wheezing, scattered rhonchi with no rales. Gastrointestinal. Abdomen distended with no organomegaly, non tender, no rebound or guarding Skin. No rashes Musculoskeletal: no joint deformities     Data Reviewed: I have personally reviewed following labs and imaging studies  CBC: Recent Labs  Lab 11/17/18 0402 11/19/18 0430 11/20/18 0404 11/22/18 0404 11/23/18 0333  WBC 9.6 12.4* 14.7* 11.3* 13.2*  NEUTROABS  --   --   --  8.7* 10.3*  HGB 8.6* 7.7* 7.6* 7.3* 7.5*  HCT 30.7* 27.6* 27.0* 26.8* 28.1*  MCV 100.7* 102.6* 103.1* 106.3* 108.1*  PLT 232 271 287 298 845   Basic Metabolic Panel: Recent Labs  Lab 11/17/18 0402  11/20/18 0404 11/22/18 0404 11/22/18 0930 11/22/18 1722 11/23/18 0333  NA 158*   < > 152* 161* 163* 159* 154*  K 2.9*   < > 4.0 3.3* 3.7 3.4* 3.4*  CL 129*   < > 121* >130* >130* >130* 125*  CO2 19*   < > 21* 22 22 20* 19*  GLUCOSE 127*   < > 117* 119* 114* 196* 203*  BUN 79*   < > 61* 57* 55* 56* 56*  CREATININE 0.68   < > 0.50 0.53 0.53 0.61 0.52  CALCIUM 8.3*   < > 8.7* 8.8* 8.9 8.6* 8.4*  PHOS 2.5  --   --   --   --   --   --    < > = values in this interval not displayed.   GFR: Estimated Creatinine Clearance: 59.6 mL/min (by C-G formula based on SCr of 0.52 mg/dL). Liver Function Tests: Recent Labs  Lab 11/16/18 1440 11/17/18 0402  AST 118*  --   ALT 424*  --   ALKPHOS 144*  --   BILITOT 1.2  --   PROT 5.7*  --   ALBUMIN 1.8* 1.8*   No results for input(s): LIPASE, AMYLASE in the last 168 hours. No results for input(s): AMMONIA in the last 168 hours. Coagulation Profile: No results for input(s): INR, PROTIME in the last 168 hours. Cardiac Enzymes: No results for input(s): CKTOTAL, CKMB, CKMBINDEX, TROPONINI in the last 168 hours. BNP (last 3  results) No results for input(s): PROBNP in the last 8760 hours. HbA1C: No results for input(s): HGBA1C in the last 72 hours. CBG: Recent Labs  Lab 11/22/18 1616 11/22/18 1929 11/22/18 2334 11/23/18 0342 11/23/18 0721  GLUCAP 164* 151* 139* 110* 113*   Lipid Profile: No results for input(s): CHOL, HDL, LDLCALC, TRIG, CHOLHDL, LDLDIRECT in the last 72 hours. Thyroid Function Tests: No results for input(s): TSH, T4TOTAL, FREET4, T3FREE, THYROIDAB in the last 72 hours. Anemia Panel: No results for input(s): VITAMINB12, FOLATE, FERRITIN, TIBC, IRON, RETICCTPCT in the last 72 hours.    Radiology Studies: I have reviewed all of the imaging during this hospital visit personally     Scheduled Meds: . acetaminophen (TYLENOL) oral liquid 160 mg/5 mL  1,000 mg Per Tube TID  . aspirin  81 mg Per Tube Daily  . chlorhexidine gluconate (MEDLINE KIT)  15  mL Mouth Rinse BID  . Chlorhexidine Gluconate Cloth  6 each Topical Daily  . free water  250 mL Per Tube Q6H  . insulin aspart  0-9 Units Subcutaneous Q4H  . levETIRAcetam  1,000 mg Per Tube BID  . mouth rinse  15 mL Mouth Rinse 10 times per day  . metoprolol tartrate  12.5 mg Per Tube BID  . midodrine  5 mg Per Tube TID WC  . multivitamin  15 mL Per Tube Daily  . nutrition supplement (JUVEN)  1 packet Per Tube BID  . pantoprazole sodium  40 mg Per Tube Daily  . phosphorus  500 mg Per Tube BID  . valproic acid  250 mg Per Tube BID  . zinc sulfate  220 mg Per Tube Daily   Continuous Infusions: . sodium chloride 5 mL/hr at 11/22/18 1022  . dextrose 75 mL/hr at 11/22/18 2200  . eravacycline 115 mg (11/23/18 0948)  . feeding supplement (PIVOT 1.5 CAL) 1,000 mL (11/22/18 1152)  . phenytoin (DILANTIN) IV 160 mg (11/23/18 0618)     LOS: 11 days         Gerome Apley, MD

## 2018-11-24 ENCOUNTER — Inpatient Hospital Stay: Payer: Self-pay

## 2018-11-24 LAB — CULTURE, BLOOD (ROUTINE X 2)
Culture: NO GROWTH
Culture: NO GROWTH
Special Requests: ADEQUATE
Special Requests: ADEQUATE

## 2018-11-24 LAB — CBC WITH DIFFERENTIAL/PLATELET
Abs Immature Granulocytes: 0.14 10*3/uL — ABNORMAL HIGH (ref 0.00–0.07)
Basophils Absolute: 0.1 10*3/uL (ref 0.0–0.1)
Basophils Relative: 1 %
Eosinophils Absolute: 0.4 10*3/uL (ref 0.0–0.5)
Eosinophils Relative: 3 %
HCT: 26.9 % — ABNORMAL LOW (ref 36.0–46.0)
Hemoglobin: 7.3 g/dL — ABNORMAL LOW (ref 12.0–15.0)
Immature Granulocytes: 1 %
Lymphocytes Relative: 21 %
Lymphs Abs: 2.4 10*3/uL (ref 0.7–4.0)
MCH: 29.1 pg (ref 26.0–34.0)
MCHC: 27.1 g/dL — ABNORMAL LOW (ref 30.0–36.0)
MCV: 107.2 fL — ABNORMAL HIGH (ref 80.0–100.0)
Monocytes Absolute: 0.4 10*3/uL (ref 0.1–1.0)
Monocytes Relative: 4 %
Neutro Abs: 8.3 10*3/uL — ABNORMAL HIGH (ref 1.7–7.7)
Neutrophils Relative %: 70 %
Platelets: 302 10*3/uL (ref 150–400)
RBC: 2.51 MIL/uL — ABNORMAL LOW (ref 3.87–5.11)
RDW: 18.6 % — ABNORMAL HIGH (ref 11.5–15.5)
WBC: 11.7 10*3/uL — ABNORMAL HIGH (ref 4.0–10.5)
nRBC: 0.4 % — ABNORMAL HIGH (ref 0.0–0.2)

## 2018-11-24 LAB — BASIC METABOLIC PANEL
Anion gap: 7 (ref 5–15)
BUN: 66 mg/dL — ABNORMAL HIGH (ref 8–23)
CO2: 20 mmol/L — ABNORMAL LOW (ref 22–32)
Calcium: 8.6 mg/dL — ABNORMAL LOW (ref 8.9–10.3)
Chloride: 127 mmol/L — ABNORMAL HIGH (ref 98–111)
Creatinine, Ser: 0.51 mg/dL (ref 0.44–1.00)
GFR calc Af Amer: >60 mL/min (ref >60)
GFR calc non Af Amer: >60 mL/min (ref >60)
Glucose, Bld: 112 mg/dL — ABNORMAL HIGH (ref 70–99)
Potassium: 3.5 mmol/L (ref 3.5–5.1)
Sodium: 154 mmol/L — ABNORMAL HIGH (ref 135–145)

## 2018-11-24 LAB — GLUCOSE, CAPILLARY
Glucose-Capillary: 144 mg/dL — ABNORMAL HIGH (ref 70–99)
Glucose-Capillary: 105 mg/dL — ABNORMAL HIGH (ref 70–99)
Glucose-Capillary: 125 mg/dL — ABNORMAL HIGH (ref 70–99)
Glucose-Capillary: 130 mg/dL — ABNORMAL HIGH (ref 70–99)
Glucose-Capillary: 103 mg/dL — ABNORMAL HIGH (ref 70–99)
Glucose-Capillary: 120 mg/dL — ABNORMAL HIGH (ref 70–99)

## 2018-11-24 LAB — HEPATIC FUNCTION PANEL
ALT: 45 U/L — ABNORMAL HIGH (ref 0–44)
AST: 22 U/L (ref 15–41)
Albumin: 1.6 g/dL — ABNORMAL LOW (ref 3.5–5.0)
Alkaline Phosphatase: 106 U/L (ref 38–126)
Bilirubin, Direct: 0.2 mg/dL (ref 0.0–0.2)
Indirect Bilirubin: 0 mg/dL — ABNORMAL LOW (ref 0.3–0.9)
Total Bilirubin: 0.2 mg/dL — ABNORMAL LOW (ref 0.3–1.2)
Total Protein: 5.2 g/dL — ABNORMAL LOW (ref 6.5–8.1)

## 2018-11-24 LAB — CULTURE, RESPIRATORY W GRAM STAIN: Special Requests: NORMAL

## 2018-11-24 MED ORDER — CHLORHEXIDINE GLUCONATE CLOTH 2 % EX PADS
6.0000 | MEDICATED_PAD | Freq: Every day | CUTANEOUS | Status: DC
  Administered 2018-11-25 – 2018-11-29 (×4): 6 via TOPICAL

## 2018-11-24 MED ORDER — ALTEPLASE 2 MG IJ SOLR
2.0000 mg | Freq: Once | INTRAMUSCULAR | Status: DC | PRN
  Filled 2018-11-24: qty 2

## 2018-11-24 MED ORDER — ERAVACYCLINE IV (FOR PTA / DISCHARGE USE ONLY): 1.5000 mg/kg | Freq: Two times a day (BID) | INTRAVENOUS | 0 refills | Status: DC

## 2018-11-24 MED ORDER — SODIUM CHLORIDE 0.9% FLUSH: 10.0000 mL | INTRAVENOUS | Status: DC | PRN

## 2018-11-24 MED ORDER — FREE WATER
300.0000 mL | Status: DC
  Administered 2018-11-24 – 2018-11-29 (×30): 300 mL

## 2018-11-24 MED ORDER — SODIUM CHLORIDE 0.9% FLUSH
10.0000 mL | Freq: Two times a day (BID) | INTRAVENOUS | Status: DC
  Administered 2018-11-25 – 2018-11-27 (×3): 10 mL
  Administered 2018-11-27: 10:00:00 20 mL
  Administered 2018-11-28 – 2018-11-29 (×3): 10 mL

## 2018-11-24 MED ORDER — CHLORHEXIDINE GLUCONATE CLOTH 2 % EX PADS: 6.0000 | MEDICATED_PAD | Freq: Every day | CUTANEOUS | Status: DC

## 2018-11-24 NOTE — Consult Note (Signed)
   [facility] CM Inpatient Consult   11/24/2018  Melissa Cochran 10-13-1935 [id number]    Patient reviewed for long length of stay and with 47% extreme high risk score for unplanned readmission and hospitalizations; and to check if potential [facility] services are needed under her Medicare/ [employer] plan.   This 83 year old female presented with sepsis. (Septic shock due to gram-negative bacteremia/Klebsiella, urinary tract infection, present on admission).  Review of patient's medical record reveals patient is returning to [facility] skilled nursing facility.  Patient is a resident at [facility].   No current [facility] Care Management needs for community follow up at this point.   For questions, please call:  Edwena Felty A. Zeno Hickel, BSN, RN-BC [facility] Liaison Cell: [phone]

## 2018-11-24 NOTE — Progress Notes (Signed)
[facility] for Infectious Disease   Reason for visit: Follow up on MDR infection  Interval History: remains unresponsive, remains febrile, WBC down to 11.7.  No acute events otherwise.  Day 3/7 of Eravacycline  Physical Exam: Constitutional:  Vitals:   11/24/18 0821 11/24/18 0900  BP:  117/72  Pulse:  (!) 107  Resp:  18  Temp: 99.9 F (37.7 C)   SpO2:  100%   patient is unresponsive Eyes: anicteric HENT: + trach Respiratory: respiratory effort on vent; CTA, anterior exam Cardiovascular: RRR GI: soft, nt, nd  Review of Systems: Unable to be assessed due to mental status  Lab Results  Component Value Date   WBC 11.7 (H) 11/24/2018   HGB 7.3 (L) 11/24/2018   HCT 26.9 (L) 11/24/2018   MCV 107.2 (H) 11/24/2018   PLT 302 11/24/2018    Lab Results  Component Value Date   CREATININE 0.51 11/24/2018   BUN 66 (H) 11/24/2018   NA 154 (H) 11/24/2018   K 3.5 11/24/2018   CL 127 (H) 11/24/2018   CO2 20 (L) 11/24/2018    Lab Results  Component Value Date   ALT 424 (H) 11/16/2018   AST 118 (H) 11/16/2018   ALKPHOS 144 (H) 11/16/2018     Microbiology: Recent Results (from the past 240 hour(s))  Culture, blood (routine x 2)     Status: None (Preliminary result)   Collection Time: 11/19/18  2:16 PM   Specimen: BLOOD RIGHT HAND  Result Value Ref Range Status   Specimen Description BLOOD RIGHT HAND  Final   Special Requests   Final    BOTTLES DRAWN AEROBIC ONLY Blood Culture adequate volume   Culture   Final    NO GROWTH 4 DAYS Performed at [facility], [address] [place], [place] [address]    Report Status PENDING  Incomplete  Culture, blood (routine x 2)     Status: None (Preliminary result)   Collection Time: 11/19/18  2:27 PM   Specimen: BLOOD RIGHT HAND  Result Value Ref Range Status   Specimen Description BLOOD RIGHT HAND  Final   Special Requests   Final    BOTTLES DRAWN AEROBIC ONLY Blood Culture adequate volume   Culture   Final    NO  GROWTH 4 DAYS Performed at [facility], [place] [address], [place], [place] [address]    Report Status PENDING  Incomplete  C difficile quick scan w PCR reflex     Status: None   Collection Time: 11/20/18  2:30 PM   Specimen: STOOL  Result Value Ref Range Status   C Diff antigen NEGATIVE NEGATIVE Final   C Diff toxin NEGATIVE NEGATIVE Final   C Diff interpretation No C. difficile detected.  Final    Comment: Performed at [facility], [place] [address] [place], [place] [address]  Culture, respiratory (non-expectorated)     Status: None   Collection Time: 11/21/18  8:46 AM   Specimen: Tracheal Aspirate; Respiratory  Result Value Ref Range Status   Specimen Description TRACHEAL ASPIRATE  Final   Special Requests Normal  Final   Gram Stain   Final    MODERATE WBC PRESENT, PREDOMINANTLY PMN MODERATE GRAM POSITIVE RODS    Culture   Final    FEW STENOTROPHOMONAS MALTOPHILIA ABUNDANT DIPHTHEROIDS(CORYNEBACTERIUM SPECIES) ORGANISM 2 Standardized susceptibility testing for this organism is not available. Performed at [facility], [place] [address] [place], [place] [address]  Report Status 11/24/2018 FINAL  Final   Organism ID, Bacteria STENOTROPHOMONAS MALTOPHILIA  Final      Susceptibility   Stenotrophomonas maltophilia - MIC*    LEVOFLOXACIN 4 INTERMEDIATE Intermediate     TRIMETH/SULFA <=20 SENSITIVE Sensitive     * FEW STENOTROPHOMONAS MALTOPHILIA    Impression/Plan:  1. MDR pneumonia - possible though her respiratory status has been stable.  On eravacycline.  Has grown out Stenotrophomonas and MDR acinetobacter.  Some improvement in WBC, afebrile.   Treat for 7 days of Eravacycline and stop.  2.  Fever - has remained febrile, likely from #1 or unknown cause.  On antibiotics as above.   3.  Luekocytosis - improved now.  Likely from #1.    4.  Access - has picc line.    I will sign off, call with any questions.

## 2018-11-24 NOTE — Discharge Summary (Addendum)
Physician Discharge Summary  Melissa Cochran JSH:[id number] DOB: Nov 07, 1935 DOA: 11/11/2018  PCP: [name], MD  Admit date: 11/11/2018 Discharge date: 11/27/2018  [facility]  Recommendations for Outpatient Follow-up and new medication changes:  1. Continue ervacycline until November 29, 2018. 2. Picc line care and timing per primary team at [facility].    Discharge Condition: stable CODE STATUS: full  Diet recommendation: tube feedings.   Brief/Interim Summary: 83 year old female who presented with sepsis. She does have significant past medical history for chronic hypoxic respiratory failure, ventilator dependent., coronaryareterydisease, heart failure, paroxysmal atrialfibrillation, COPD and CVA. She also has seizures,DVT,GERD and hypertension.  She is a resident from [facility] she was transferred due to acute on chronic encephalopathy and hypotension related to urinary tract infection. She does have a poor baseline,onlytracking with her eyes. On her initial physical examination her blood pressure was 90/57, pulse rate 102, temperature 38.3 C, respiratory rate 24 oxygen saturation 98%.She was unresponsive, trach in place, scattered rhonchi bilaterally, heart S1-S2 present regular, abdomen soft, no lower extremity edema.  Sodium 130, potassium 5.1, chloride 98, bicarb 13, glucose 125, BUN 57, creatinine 1.68, white count 45.6, hemoglobin 9.7, hematocrit 39.3, platelets 387.SARS COVID-19 negative. Urinalysis had specific gravity 1.020, few bacteria, moderate leukocytes. Stool for C. difficile was negative. Her chest radiograph had bibasilar atelectasis, CT of the chest and abdomen no acute changes.EKG 106 bpm, normal axis, normal intervals (corrected QTC 432),septal Q waves, no significant ST-T segment or T wave changes.  Patient was admitted with a working diagnosis of sepsis due to urinary tract infection.  Patient developed shock  due to gram-negative bacteremia, Klebsiella Pneumonia, she required vasopressors and broad-spectrum antibiotic therapy.  Persistent fevers, due to tracheobronchitis-ventilator associated pneumonia (not present on admission) due to stenotrophomonas maltophilia and MDR acinetobacter.   1.  Septic shock due to gram-negative bacteremia/Klebsiella, urinary tract infection, present on admission.  Patient was admitted to the intensive care unit, she received broad spectrum IV antibiotic therapy, vancomycin, meropenem, ceftazidime-avabactam. Blood cultures and urine culture grew Klebsiella pneumonia. Patient responded well to medical therapy, regained hemodynamic stability, vasopressors were discontinued.  2.  Ventilator associated pneumonia/tracheobronchitis, not present on admission.  Patient had persistent fevers, further work-up with respiratory culture was positive for stenotrophomonas and MDR Acinetobacter.  Infectious disease was consulted, patient was placed on eravacycline with good toleration.  Patient will complete antibiotic therapy on July 22.  3.Chronic systolic heart failure. No signs of acute exacerbation. Continue with midodrine.   4.Paroxysmal atrial fibrillation.Continue rate control with metoprolol, continue aspirin for antiplatelet therapy.   5.Ventilator dependent respiratory failure, hypercapnic and hypoxic, acute on chronic.Patient was continued on mechanical ventilation with good toleration, no significant dyssynchrony.  Patient on PC mode, with pressure of 27, peep 5 Fi02 30%, and respiratory rate of 20, generating Vt 450 ml. No significant dyssynchrony.    5.Hyponatremia/ hypernatremia.Patient received IV fluids with improvement of electrolytes.   She required D5 water infusion and free water flushes.  Discharge sodium is 143, chloride 118, creatinine 0.65 and serum bicarbonate of 18. Continue free water flushes.   6.Seizures.No active seizure activity,  continue, valproic acid Keppra and phenytoin.  7.COPD.No signs of acute exacerbation.  8.Chronic anemia.Likely multifactorial, continue monitoring cell count. Transfusion for hemoglobin less than 7.  Discharge hemoglobin 7.3, hematocrit 26.9.  9.Chronic encephalopathy.Patient with very poor prognosis, at baseline only tracks with her eyes. Continue supportive care, palliative care services were consulted, family would like to continue aggressive care, continue full  code.  10.Multiple pressure ulcers,right heel (unstageable), sacrum (stage III), vertebral column (stage III), left foot (unstageable)/ present on admission.Continue local wound care.  11.Type 2 diabetes mellitus.Continue insulin therapy, sliding scale, for glucose coverage and monitoring. Continue tube feeds.   Discharge Diagnoses:  Principal Problem:   Fever Active Problems:   Chronic respiratory failure (HCC)   Sepsis (HCC)   Bacteremia due to Klebsiella pneumoniae   UTI (urinary tract infection)   Pressure injury of skin    Discharge Instructions   Allergies as of 11/27/2018   No Known Allergies     Medication List    STOP taking these medications   ceFEPime  IVPB Commonly known as: MAXIPIME   furosemide 10 MG/ML injection Commonly known as: LASIX   vancomycin  IVPB     TAKE these medications   acetaminophen 325 MG tablet Commonly known as: TYLENOL Take 650 mg by mouth every 4 (four) hours as needed for mild pain.   albuterol (2.5 MG/3ML) 0.083% nebulizer solution Commonly known as: PROVENTIL Take 2.5 mg by nebulization every 6 (six) hours as needed for wheezing or shortness of breath.   aspirin 81 MG chewable tablet Place 1 tablet (81 mg total) into feeding tube daily.   chlorhexidine 0.12 % solution Commonly known as: PERIDEX 5 mLs See admin instructions. 5 ml's for trach care 2 times a day   CRITIC-AID EX Apply 1 application topically daily. Apply to  buttocks   diphenoxylate-atropine 2.5-0.025 MG tablet Commonly known as: LOMOTIL Place 1 tablet into feeding tube 4 (four) times daily as needed for diarrhea or loose stools.   enoxaparin 40 MG/0.4ML injection Commonly known as: LOVENOX Inject 0.4 mLs (40 mg total) into the skin daily.   eravacycline  IVPB Commonly known as: XERAVA Inject 1.5 mg/kg into the vein every 12 (twelve) hours for 5 days. Indication: MDR PNA Last Day of Therapy:  11/29/2018 Labs - Once weekly:  CBC/D and CMP Labs - Every other week:  ESR and CRP   famotidine 20 MG tablet Commonly known as: PEPCID Place 20 mg into feeding tube daily.   feeding supplement (OSMOLITE 1.5 CAL) Liqd Place 1,000 mLs into feeding tube continuous.   feeding supplement (PRO-STAT SUGAR FREE 64) Liqd Place 30 mLs into feeding tube 3 (three) times daily.   free water Soln Place 300 mLs into feeding tube every 4 (four) hours.   HYDROcodone-acetaminophen 5-325 MG tablet Commonly known as: NORCO/VICODIN Place 1 tablet into feeding tube every 6 (six) hours as needed for moderate pain.   levETIRAcetam 100 MG/ML solution Commonly known as: KEPPRA Place 10 mLs (1,000 mg total) into feeding tube 2 (two) times daily. What changed: when to take this   midodrine 5 MG tablet Commonly known as: PROAMATINE Take 1 tablet (5 mg total) by mouth 3 (three) times daily with meals. What changed: when to take this   multivitamin Liqd Place 15 mLs into feeding tube daily.   NORMAL SALINE FLUSH IV Inject 10 mLs into the vein See admin instructions. Use 10 ml's via IV every shift for any used lumens- flush each unused lumen   nystatin 100000 UNIT/ML suspension Commonly known as: MYCOSTATIN Take 5 mLs (500,000 Units total) by mouth 4 (four) times daily. What changed: when to take this   pantoprazole sodium 40 mg/20 mL Pack Commonly known as: PROTONIX Place 20 mLs (40 mg total) into feeding tube daily.   phenytoin 125 MG/5ML  suspension Commonly known as: DILANTIN Place 500 mg into feeding  tube every 12 (twelve) hours.   Valproate Sodium 250 MG/5ML Soln solution Commonly known as: DEPAKENE Place 5 mLs (250 mg total) into feeding tube 2 (two) times daily. What changed: when to take this            Home Infusion Instuctions  (From admission, onward)         Start     Ordered   11/24/18 0000  Home infusion instructions [facility] May follow [place] Dosing Protocol; May administer Cathflo as needed to maintain patency of vascular access device.; Flushing of vascular access device: per [facility] Protocol: 0.9% NaCl pre/post medica...    Question Answer Comment  Instructions May follow [place] Dosing Protocol   Instructions May administer Cathflo as needed to maintain patency of vascular access device.   Instructions Flushing of vascular access device: per [facility] Protocol: 0.9% NaCl pre/post medication administration and prn patency; Heparin 100 u/ml, 73m for implanted ports and Heparin 10u/ml, 518mfor all other central venous catheters.   Instructions May follow [facility] Anaphylaxis Protocol for First Dose Administration in the home: 0.9% NaCl at 25-50 ml/hr to maintain IV access for protocol meds. Epinephrine 0.3 ml IV/IM PRN and Benadryl 25-50 IV/IM PRN s/s of anaphylaxis.   Instructions [facility] Infusion Coordinator (RN) to assist per patient IV care needs in the home PRN.      11/24/18 1023          No Known Allergies  Consultations:  ID   Procedures/Studies: Ct Abdomen Pelvis Wo Contrast  Result Date: 11/12/2018 CLINICAL DATA:  Inpatient. Sepsis. History of CVA, CHF, COPD, bladder tumor. EXAM: CT CHEST, ABDOMEN AND PELVIS WITHOUT CONTRAST TECHNIQUE: Multidetector CT imaging of the chest, abdomen and pelvis was performed following the standard protocol without IV contrast. COMPARISON:  Abdominal radiograph from earlier today. Chest radiograph from one day prior. 09/27/2018 CT chest,  abdomen and pelvis. FINDINGS: CT CHEST FINDINGS Cardiovascular: Normal heart size. No significant pericardial effusion/thickening. Three-vessel coronary atherosclerosis. Left PICC terminates in the middle third of the SVC. Atherosclerotic nonaneurysmal thoracic aorta. Normal caliber pulmonary arteries. Mediastinum/Nodes: Stable hypodense 1.9 cm left thyroid lobe nodule. Unremarkable esophagus. No axillary adenopathy. Stable mildly enlarged 1.0 cm right paratracheal (series 3/image 24) and 1.3 cm AP window (series 3/image 24) nodes. No discrete hilar adenopathy on this noncontrast scan. Lungs/Pleura: No pneumothorax. Trace dependent left pleural effusion with mild smooth left pleural thickening. No right pleural effusion. Tracheostomy tube tip is in the tracheal lumen 3.6 cm above the carina. Dense consolidation in basilar lower lobes bilaterally with associated air bronchograms and some volume loss, similar. Extensive patchy tree-in-bud opacities and scattered centrilobular nodularity throughout both lungs with associated scattered mild cylindrical and varicoid bronchiectasis throughout both lungs, not substantially changed in the interval. Representative 6 mm anterior right middle lobe nodule (series 4/image 87) is stable. No discrete lung masses. Musculoskeletal: No aggressive appearing focal osseous lesions. Mild thoracic spondylosis. CT ABDOMEN PELVIS FINDINGS Hepatobiliary: Liver surface is finely irregular, cannot exclude hepatic cirrhosis. No liver mass. Normal gallbladder with no radiopaque cholelithiasis. No biliary ductal dilatation. Pancreas: Normal, with no mass or duct dilation. Spleen: Normal size. No mass. Adrenals/Urinary Tract: Normal adrenals. Nonobstructing bilateral nephrolithiasis with largest stone measuring 10 mm in the lower left kidney and 2 mm in interpolar right kidney. No contour deforming renal masses. No overt hydronephrosis. No ureteral stones. Bladder decompressed by indwelling Foley  catheter. Expected gas in the nondependent bladder lumen from instrumentation. No definite bladder wall  thickening. Stomach/Bowel: Percutaneous gastrostomy tube is in place within the body of the stomach. Postsurgical changes from gastrojejunostomy. Stomach is decompressed and otherwise appears normal. Normal caliber small bowel with no small bowel wall thickening. Appendix not discretely visualized. Mild sigmoid diverticulosis, with no definite large bowel wall thickening or significant pericolonic fat stranding. Scattered fluid levels in the large bowel. Rectal drainage tube in place. Vascular/Lymphatic: Atherosclerotic abdominal aorta with stable ectatic 2.6 cm infrarenal abdominal aorta. No pathologically enlarged lymph nodes in the abdomen or pelvis. Reproductive: Status post hysterectomy, with no abnormal findings at the vaginal cuff. Stable 3.7 cm right adnexal cyst (series 3/image 103). Stable multilocular 9.0 x 6.5 cm left adnexal cystic mass (series 3/image 111). Other: No pneumoperitoneum, ascites or focal fluid collection. Scattered trace ill-defined free fluid in the bilateral retroperitoneal posterior paranephric spaces. Musculoskeletal: No aggressive appearing focal osseous lesions. Left hip arthroplasty. Marked lumbar spondylosis. IMPRESSION: 1. Extensive patchy tree-in-bud opacities and scattered centrilobular nodularity throughout both lungs with associated scattered mild bronchiectasis. No appreciable interval change since 09/27/2018 CT. Differential includes recurrent aspiration and/or atypical mycobacterial infection (MAI). 2. Dense consolidation with air bronchograms at the lung bases bilaterally, unchanged, favor atelectasis and/or postinfectious/postinflammatory scarring. 3. Well-positioned tracheostomy tube, left PICC and percutaneous gastrostomy and rectal tubes. 4. Mild mediastinal lymphadenopathy is stable and probably reactive. 5. Trace dependent left pleural effusion. 6. No evidence of  bowel obstruction. Fluid levels scattered in the large bowel, indicative of a nonspecific diarrheal/malabsorptive state. Mild sigmoid diverticulosis, with no evidence of acute diverticulitis. 7. Mildly irregular liver surface, cannot exclude cirrhosis. 8. Stable multilocular 9.0 cm left adnexal cystic mass, cannot exclude left ovarian neoplasm. 9. Nonobstructive bilateral nephrolithiasis. 10.  Aortic Atherosclerosis (ICD10-I70.0). Electronically Signed   By: Ilona Sorrel M.D.   On: 11/12/2018 12:14   Dg Abdomen 1 View  Result Date: 11/12/2018 CLINICAL DATA:  83 y/o  F; evaluate for obstruction. EXAM: ABDOMEN - 1 VIEW COMPARISON:  09/27/2018 CT abdomen and pelvis FINDINGS: Peg tube tip projects over the left upper quadrant. There are left upper quadrant surgical sutures and staples. Moderate rotatory levocurvature of the lumbar spine. Abdominal aortic calcific atherosclerosis. Bowel gas pattern is normal. Left hip prosthesis, partially visualized. IMPRESSION: Normal bowel gas pattern. Electronically Signed   By: Kristine Garbe M.D.   On: 11/12/2018 06:13   Ct Chest Wo Contrast  Result Date: 11/12/2018 CLINICAL DATA:  Inpatient. Sepsis. History of CVA, CHF, COPD, bladder tumor. EXAM: CT CHEST, ABDOMEN AND PELVIS WITHOUT CONTRAST TECHNIQUE: Multidetector CT imaging of the chest, abdomen and pelvis was performed following the standard protocol without IV contrast. COMPARISON:  Abdominal radiograph from earlier today. Chest radiograph from one day prior. 09/27/2018 CT chest, abdomen and pelvis. FINDINGS: CT CHEST FINDINGS Cardiovascular: Normal heart size. No significant pericardial effusion/thickening. Three-vessel coronary atherosclerosis. Left PICC terminates in the middle third of the SVC. Atherosclerotic nonaneurysmal thoracic aorta. Normal caliber pulmonary arteries. Mediastinum/Nodes: Stable hypodense 1.9 cm left thyroid lobe nodule. Unremarkable esophagus. No axillary adenopathy. Stable mildly  enlarged 1.0 cm right paratracheal (series 3/image 24) and 1.3 cm AP window (series 3/image 24) nodes. No discrete hilar adenopathy on this noncontrast scan. Lungs/Pleura: No pneumothorax. Trace dependent left pleural effusion with mild smooth left pleural thickening. No right pleural effusion. Tracheostomy tube tip is in the tracheal lumen 3.6 cm above the carina. Dense consolidation in basilar lower lobes bilaterally with associated air bronchograms and some volume loss, similar. Extensive patchy tree-in-bud opacities and scattered centrilobular nodularity throughout both lungs with  associated scattered mild cylindrical and varicoid bronchiectasis throughout both lungs, not substantially changed in the interval. Representative 6 mm anterior right middle lobe nodule (series 4/image 87) is stable. No discrete lung masses. Musculoskeletal: No aggressive appearing focal osseous lesions. Mild thoracic spondylosis. CT ABDOMEN PELVIS FINDINGS Hepatobiliary: Liver surface is finely irregular, cannot exclude hepatic cirrhosis. No liver mass. Normal gallbladder with no radiopaque cholelithiasis. No biliary ductal dilatation. Pancreas: Normal, with no mass or duct dilation. Spleen: Normal size. No mass. Adrenals/Urinary Tract: Normal adrenals. Nonobstructing bilateral nephrolithiasis with largest stone measuring 10 mm in the lower left kidney and 2 mm in interpolar right kidney. No contour deforming renal masses. No overt hydronephrosis. No ureteral stones. Bladder decompressed by indwelling Foley catheter. Expected gas in the nondependent bladder lumen from instrumentation. No definite bladder wall thickening. Stomach/Bowel: Percutaneous gastrostomy tube is in place within the body of the stomach. Postsurgical changes from gastrojejunostomy. Stomach is decompressed and otherwise appears normal. Normal caliber small bowel with no small bowel wall thickening. Appendix not discretely visualized. Mild sigmoid diverticulosis,  with no definite large bowel wall thickening or significant pericolonic fat stranding. Scattered fluid levels in the large bowel. Rectal drainage tube in place. Vascular/Lymphatic: Atherosclerotic abdominal aorta with stable ectatic 2.6 cm infrarenal abdominal aorta. No pathologically enlarged lymph nodes in the abdomen or pelvis. Reproductive: Status post hysterectomy, with no abnormal findings at the vaginal cuff. Stable 3.7 cm right adnexal cyst (series 3/image 103). Stable multilocular 9.0 x 6.5 cm left adnexal cystic mass (series 3/image 111). Other: No pneumoperitoneum, ascites or focal fluid collection. Scattered trace ill-defined free fluid in the bilateral retroperitoneal posterior paranephric spaces. Musculoskeletal: No aggressive appearing focal osseous lesions. Left hip arthroplasty. Marked lumbar spondylosis. IMPRESSION: 1. Extensive patchy tree-in-bud opacities and scattered centrilobular nodularity throughout both lungs with associated scattered mild bronchiectasis. No appreciable interval change since 09/27/2018 CT. Differential includes recurrent aspiration and/or atypical mycobacterial infection (MAI). 2. Dense consolidation with air bronchograms at the lung bases bilaterally, unchanged, favor atelectasis and/or postinfectious/postinflammatory scarring. 3. Well-positioned tracheostomy tube, left PICC and percutaneous gastrostomy and rectal tubes. 4. Mild mediastinal lymphadenopathy is stable and probably reactive. 5. Trace dependent left pleural effusion. 6. No evidence of bowel obstruction. Fluid levels scattered in the large bowel, indicative of a nonspecific diarrheal/malabsorptive state. Mild sigmoid diverticulosis, with no evidence of acute diverticulitis. 7. Mildly irregular liver surface, cannot exclude cirrhosis. 8. Stable multilocular 9.0 cm left adnexal cystic mass, cannot exclude left ovarian neoplasm. 9. Nonobstructive bilateral nephrolithiasis. 10.  Aortic Atherosclerosis  (ICD10-I70.0). Electronically Signed   By: Ilona Sorrel M.D.   On: 11/12/2018 12:14   Dg Chest Port 1 View  Result Date: 11/19/2018 CLINICAL DATA:  Fever and chills. EXAM: PORTABLE CHEST 1 VIEW COMPARISON:  November 11, 2018 FINDINGS: Tracheostomy tube in stable position. Cardiomediastinal silhouette is normal. Mediastinal contours appear intact. Calcific atherosclerotic disease of the aorta. Mild peribronchial airspace opacities in bilateral lower lobes, left greater than right. Osseous structures are without acute abnormality. Soft tissues are grossly normal. IMPRESSION: Mild peribronchial airspace opacities in bilateral lower lobes, left greater than right. Nonspecific findings which may represent atelectasis or multifocal airspace disease. Electronically Signed   By: Fidela Salisbury M.D.   On: 11/19/2018 15:22   Dg Chest Port 1 View  Result Date: 11/11/2018 CLINICAL DATA:  Fever. EXAM: PORTABLE CHEST 1 VIEW COMPARISON:  Most recent radiograph 10/02/2018. Most recent CT 09/27/2018 FINDINGS: Tracheostomy tube tip 4 cm from the carina. Left upper extremity PICC in the mid SVC.  Improvement in bilateral multifocal airspace disease with mild residual streaky opacities at the bases. Unchanged heart size and mediastinal contours. Aortic atherosclerosis. Mitral annulus calcifications. Prior right chest tube is been removed, no visualized pneumothorax. No large pleural effusion. IMPRESSION: 1. Improvement in bilateral multifocal airspace disease over the past 5 weeks with mild residual streaky opacities at the bases, likely atelectasis or scarring. 2. Tracheostomy tube and left upper extremity PICC remain in place. Electronically Signed   By: Keith Rake M.D.   On: 11/11/2018 23:25      Procedures:   Subjective: Patient continue to be unresponsive, not following commands, tolerating well mechanical ventilation and tube feedings.   Discharge Exam: Vitals:   11/24/18 0821 11/24/18 0900  BP:  117/72   Pulse:  (!) 107  Resp:  18  Temp: 99.9 F (37.7 C)   SpO2:  100%   Vitals:   11/24/18 0755 11/24/18 0800 11/24/18 0821 11/24/18 0900  BP: 110/63 (!) 103/54  117/72  Pulse: (!) 102 (!) 104  (!) 107  Resp: (!) 21 (!) 35  18  Temp:   99.9 F (37.7 C)   TempSrc:   Oral   SpO2: 100% 100%  100%  Weight:      Height:        General: deconditioned  Neurology: not responsive E ENT: mild pallor, trach in place.  Cardiovascular: No JVD. S1-S2 present, rhythmic, no gallops, rubs, or murmurs. No lower extremity edema. Pulmonary: positive breath sounds bilaterally, adequate air movement, no wheezing, rhonchi or rales. Gastrointestinal. Abdomen with distention no organomegaly, non tender, no rebound or guarding. Peg tube in place.  Skin. Multiple pressure ulcers Musculoskeletal: no joint deformities   The results of significant diagnostics from this hospitalization (including imaging, microbiology, ancillary and laboratory) are listed below for reference.     Microbiology: Recent Results (from the past 240 hour(s))  Culture, blood (routine x 2)     Status: None (Preliminary result)   Collection Time: 11/19/18  2:16 PM   Specimen: BLOOD RIGHT HAND  Result Value Ref Range Status   Specimen Description BLOOD RIGHT HAND  Final   Special Requests   Final    BOTTLES DRAWN AEROBIC ONLY Blood Culture adequate volume   Culture   Final    NO GROWTH 4 DAYS Performed at [facility], [address], [place], [place] [address]    Report Status PENDING  Incomplete  Culture, blood (routine x 2)     Status: None (Preliminary result)   Collection Time: 11/19/18  2:27 PM   Specimen: BLOOD RIGHT HAND  Result Value Ref Range Status   Specimen Description BLOOD RIGHT HAND  Final   Special Requests   Final    BOTTLES DRAWN AEROBIC ONLY Blood Culture adequate volume   Culture   Final    NO GROWTH 4 DAYS Performed at [facility], [place] [address] [place], [place] [address]    Report  Status PENDING  Incomplete  C difficile quick scan w PCR reflex     Status: None   Collection Time: 11/20/18  2:30 PM   Specimen: STOOL  Result Value Ref Range Status   C Diff antigen NEGATIVE NEGATIVE Final   C Diff toxin NEGATIVE NEGATIVE Final   C Diff interpretation No C. difficile detected.  Final    Comment: Performed at [facility], [place] [address] [place], [place] [address]  Culture, respiratory (non-expectorated)     Status: None  Collection Time: 11/21/18  8:46 AM   Specimen: Tracheal Aspirate; Respiratory  Result Value Ref Range Status   Specimen Description TRACHEAL ASPIRATE  Final   Special Requests Normal  Final   Gram Stain   Final    MODERATE WBC PRESENT, PREDOMINANTLY PMN MODERATE GRAM POSITIVE RODS    Culture   Final    FEW STENOTROPHOMONAS MALTOPHILIA ABUNDANT DIPHTHEROIDS(CORYNEBACTERIUM SPECIES) ORGANISM 2 Standardized susceptibility testing for this organism is not available. Performed at [facility], [place] [address] [place], [place] [address]    Report Status 11/24/2018 FINAL  Final   Organism ID, Bacteria STENOTROPHOMONAS MALTOPHILIA  Final      Susceptibility   Stenotrophomonas maltophilia - MIC*    LEVOFLOXACIN 4 INTERMEDIATE Intermediate     TRIMETH/SULFA <=20 SENSITIVE Sensitive     * FEW STENOTROPHOMONAS MALTOPHILIA     Labs: BNP (last 3 results) No results for input(s): BNP in the last 8760 hours. Basic Metabolic Panel: Recent Labs  Lab 11/22/18 0404 11/22/18 0930 11/22/18 1722 11/23/18 0333 11/24/18 0348  NA 161* 163* 159* 154* 154*  K 3.3* 3.7 3.4* 3.4* 3.5  CL >130* >130* >130* 125* 127*  CO2 22 22 20* 19* 20*  GLUCOSE 119* 114* 196* 203* 112*  BUN 57* 55* 56* 56* 66*  CREATININE 0.53 0.53 0.61 0.52 0.51  CALCIUM 8.8* 8.9 8.6* 8.4* 8.6*   Liver Function Tests: No results for input(s): AST, ALT, ALKPHOS, BILITOT, PROT, ALBUMIN in the last 168 hours. No results for input(s): LIPASE, AMYLASE in the last 168  hours. No results for input(s): AMMONIA in the last 168 hours. CBC: Recent Labs  Lab 11/19/18 0430 11/20/18 0404 11/22/18 0404 11/23/18 0333 11/24/18 0348  WBC 12.4* 14.7* 11.3* 13.2* 11.7*  NEUTROABS  --   --  8.7* 10.3* 8.3*  HGB 7.7* 7.6* 7.3* 7.5* 7.3*  HCT 27.6* 27.0* 26.8* 28.1* 26.9*  MCV 102.6* 103.1* 106.3* 108.1* 107.2*  PLT 271 287 298 300 302   Cardiac Enzymes: No results for input(s): CKTOTAL, CKMB, CKMBINDEX, TROPONINI in the last 168 hours. BNP: Invalid input(s): POCBNP CBG: Recent Labs  Lab 11/23/18 1523 11/23/18 1922 11/23/18 2321 11/24/18 0339 11/24/18 0826  GLUCAP 96 100* 144* 105* 125*   D-Dimer No results for input(s): DDIMER in the last 72 hours. Hgb A1c No results for input(s): HGBA1C in the last 72 hours. Lipid Profile No results for input(s): CHOL, HDL, LDLCALC, TRIG, CHOLHDL, LDLDIRECT in the last 72 hours. Thyroid function studies No results for input(s): TSH, T4TOTAL, T3FREE, THYROIDAB in the last 72 hours.  Invalid input(s): FREET3 Anemia work up No results for input(s): VITAMINB12, FOLATE, FERRITIN, TIBC, IRON, RETICCTPCT in the last 72 hours. Urinalysis    Component Value Date/Time   COLORURINE YELLOW 11/11/2018 2220   APPEARANCEUR TURBID (A) 11/11/2018 2220   APPEARANCEUR Cloudy (A) 01/29/2016 1126   LABSPEC 1.020 11/11/2018 2220   LABSPEC 1.020 01/23/2014 1047   PHURINE 7.5 11/11/2018 2220   GLUCOSEU NEGATIVE 11/11/2018 2220   GLUCOSEU NEGATIVE 01/23/2014 1047   HGBUR LARGE (A) 11/11/2018 2220   BILIRUBINUR SMALL (A) 11/11/2018 2220   BILIRUBINUR Negative 01/13/2016 1110   BILIRUBINUR NEGATIVE 01/23/2014 1047   KETONESUR NEGATIVE 11/11/2018 2220   PROTEINUR >300 (A) 11/11/2018 2220   UROBILINOGEN 0.2 08/04/2015 1140   NITRITE NEGATIVE 11/11/2018 2220   LEUKOCYTESUR MODERATE (A) 11/11/2018 2220   LEUKOCYTESUR TRACE 01/23/2014 1047   Sepsis Labs Invalid input(s): PROCALCITONIN,  WBC,  LACTICIDVEN Microbiology Recent  Results (from  the past 240 hour(s))  Culture, blood (routine x 2)     Status: None (Preliminary result)   Collection Time: 11/19/18  2:16 PM   Specimen: BLOOD RIGHT HAND  Result Value Ref Range Status   Specimen Description BLOOD RIGHT HAND  Final   Special Requests   Final    BOTTLES DRAWN AEROBIC ONLY Blood Culture adequate volume   Culture   Final    NO GROWTH 4 DAYS Performed at [facility], [place] [address] [place], [place] [address]    Report Status PENDING  Incomplete  Culture, blood (routine x 2)     Status: None (Preliminary result)   Collection Time: 11/19/18  2:27 PM   Specimen: BLOOD RIGHT HAND  Result Value Ref Range Status   Specimen Description BLOOD RIGHT HAND  Final   Special Requests   Final    BOTTLES DRAWN AEROBIC ONLY Blood Culture adequate volume   Culture   Final    NO GROWTH 4 DAYS Performed at [facility], [place] [address] [place], [place] [address]    Report Status PENDING  Incomplete  C difficile quick scan w PCR reflex     Status: None   Collection Time: 11/20/18  2:30 PM   Specimen: STOOL  Result Value Ref Range Status   C Diff antigen NEGATIVE NEGATIVE Final   C Diff toxin NEGATIVE NEGATIVE Final   C Diff interpretation No C. difficile detected.  Final    Comment: Performed at [facility], [place] [address], [place], [place] [address]  Culture, respiratory (non-expectorated)     Status: None   Collection Time: 11/21/18  8:46 AM   Specimen: Tracheal Aspirate; Respiratory  Result Value Ref Range Status   Specimen Description TRACHEAL ASPIRATE  Final   Special Requests Normal  Final   Gram Stain   Final    MODERATE WBC PRESENT, PREDOMINANTLY PMN MODERATE GRAM POSITIVE RODS    Culture   Final    FEW STENOTROPHOMONAS MALTOPHILIA ABUNDANT DIPHTHEROIDS(CORYNEBACTERIUM SPECIES) ORGANISM 2 Standardized susceptibility testing for this organism is not available. Performed at [facility], [place] [address], [place], [place]  [address]    Report Status 11/24/2018 FINAL  Final   Organism ID, Bacteria STENOTROPHOMONAS MALTOPHILIA  Final      Susceptibility   Stenotrophomonas maltophilia - MIC*    LEVOFLOXACIN 4 INTERMEDIATE Intermediate     TRIMETH/SULFA <=20 SENSITIVE Sensitive     * FEW STENOTROPHOMONAS MALTOPHILIA     Time coordinating discharge: 45 minutes  SIGNED:   Tawni Millers, MD  [facility] 11/24/2018, 9:53 AM

## 2018-11-24 NOTE — TOC Initial Note (Signed)
Transition of Care [facility]) - Initial/Assessment Note    Patient Details  Name: Melissa Cochran MRN: [id number] Date of Birth: 07-27-1935  Transition of [facility]) CM/SW Contact:    Bartholomew Crews, RN Phone Number: [phone] 11/24/2018, 12:48 PM  Clinical Narrative:                 PTA patient resdided at [facility]. CSW spoke with [facility] in admissions. No female beds available at this time. Will f/u on Monday.   Expected Discharge Plan: Long Term Nursing Home Barriers to Discharge: Vent Bed not available   Patient Goals and CMS Choice     Choice offered to / list presented to : NA  Expected Discharge Plan and Services Expected Discharge Plan: Long Term Nursing Home In-house Referral: Clinical Social Work Discharge Planning Services: CM Consult Post Acute Care Choice: NA Living arrangements for the past 2 months: [place] Expected Discharge Date: 11/24/18               DME Arranged: N/A DME Agency: NA       HH Arranged: NA [place] Agency: NA        Prior Living Arrangements/Services Living arrangements for the past 2 months: Post-Acute Facility                  Criminal Activity/Legal Involvement Pertinent to Current Situation/Hospitalization: No - Comment as needed  Activities of Daily Living Home Assistive Devices/Equipment: None ADL Screening (condition at time of admission) Patient's cognitive ability adequate to safely complete daily activities?: No Is the patient deaf or have difficulty hearing?: No Does the patient have difficulty seeing, even when wearing glasses/contacts?: No Does the patient have difficulty concentrating, remembering, or making decisions?: No Patient able to express need for assistance with ADLs?: No Does the patient have difficulty dressing or bathing?: Yes Independently performs ADLs?: No Communication: Dependent Is this a change from baseline?: Pre-admission baseline Dressing (OT): Dependent Is this a change from  baseline?: Pre-admission baseline Grooming: Dependent Is this a change from baseline?: Pre-admission baseline Feeding: Dependent Is this a change from baseline?: Pre-admission baseline Bathing: Dependent Is this a change from baseline?: Pre-admission baseline Toileting: Dependent Is this a change from baseline?: Pre-admission baseline In/Out Bed: Dependent Is this a change from baseline?: Pre-admission baseline Walks in Home: Dependent Is this a change from baseline?: Pre-admission baseline Does the patient have difficulty walking or climbing stairs?: Yes Weakness of Legs: Both Weakness of Arms/Hands: Both  Permission Sought/Granted                  Emotional Assessment           Psych Involvement: No (comment)  Admission diagnosis:  Abdominal distention [R14.0] Septic shock ([place]) [A41.9, R65.21] Patient Active Problem List   Diagnosis Date Noted  . Pressure injury of skin 11/20/2018  . Fever 11/20/2018  . Bacteremia due to Klebsiella pneumoniae 11/13/2018  . UTI (urinary tract infection) 11/13/2018  . Sepsis ([place]) 11/12/2018  . Goals of care, counseling/discussion   . Palliative care by specialist   . Meningioma, cerebral ([place]) 09/27/2018  . Other emphysema ([place]) 09/27/2018  . Febrile illness, acute   . Severe sepsis with septic shock ([place]) 09/26/2018  . Hypernatremia 09/26/2018  . AKI (acute kidney injury) ([place]) 09/26/2018  . CVA (cerebral vascular accident) ([place]) 04/19/2018  . Chronic respiratory failure ([place]) 04/19/2018  . E-coli UTI, ESBL  06/30/2017  . A-fib ([place]) 06/30/2017  . Need for protective airway ventilation  06/30/2017  . Seizures ([place]) 06/26/2017  . Pressure ulcer 02/17/2017  . Bilateral lower extremity edema 10/08/2016  . Venous ulcer of ankle ([place]) 07/08/2016  . DVT (deep venous thrombosis) ([place]) 07/02/2016  . S/P coronary artery stent placement 10/30/2015  . 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 Mass of lower lobe  of left lung, incidetnal on CT 09/2015, smoker.  09/29/2015  . Other microscopic hematuria 08/26/2015  . Urge incontinence 08/26/2015  . Essential (primary) hypertension 12/09/2014  . Adnexal mass 11/28/2013  . History of cardiac catheterization 11/28/2013  . Hyperlipidemia 11/28/2013   PCP:  [name], MD Pharmacy:  No Pharmacies Listed    Social Determinants of Health (SDOH) Interventions    Readmission Risk Interventions No flowsheet data found.

## 2018-11-24 NOTE — Clinical Social Work Note (Signed)
CSW advised that patient is ready for discharge. Call made to [facility] admissions director Guadelupe Sabin [phone]) and was advised that they do not have an available bed right now. CSW can check with her on Monday. MD advised.  Sybella Harnish Givens, MSW, LCSW Licensed Clinical Social Worker [place] [phone]

## 2018-11-24 NOTE — [place] Notes (Signed)
Peripherally Inserted Central Catheter/Midline Placement  The IV Nurse has discussed with the patient and/or persons authorized to consent for the patient, the purpose of this procedure and the potential benefits and risks involved with this procedure.  The benefits include less needle sticks, lab draws from the catheter, and the patient may be discharged home with the catheter. Risks include, but not limited to, infection, bleeding, blood clot (thrombus formation), and puncture of an artery; nerve damage and irregular heartbeat and possibility to perform a PICC exchange if needed/ordered by physician.  Alternatives to this procedure were also discussed.  Bard Power PICC patient education guide, fact sheet on infection prevention and patient information card has been provided to patient /or left at bedside.  PICC exchanged Consent obtained from family via telephone.   PICC/Midline Placement Documentation  PICC Double Lumen 11/24/18 PICC Left  44 cm 0 cm (Active)  Indication for Insertion or Continuance of Line Prolonged intravenous therapies 11/24/18 1716  Exposed Catheter (cm) 0 cm 11/24/18 1716  Site Assessment Clean;Dry;Intact 11/24/18 1716  Lumen #1 Status Flushed;Saline locked;Blood return noted 11/24/18 1716  Lumen #2 Status Flushed;Saline locked;Blood return noted 11/24/18 1716  Dressing Type Transparent 11/24/18 1716  Dressing Status Clean;Dry;Intact 11/24/18 1716  Dressing Intervention New dressing 11/24/18 1716  Dressing Change Due 12/01/18 11/24/18 [place], Nicolette Bang 11/24/2018, 5:18 PM

## 2018-11-24 NOTE — NC FL2 (Signed)
[place] LEVEL OF CARE SCREENING TOOL     IDENTIFICATION  Patient Name: Melissa Cochran Birthdate: [birth year] Sex: female Admission Date (Current Location): 11/11/2018  [place] and [place] Number:  [place] [id number] [place] and Address:  [place]. [facility], [place] [address] [place], [place] [address]      Provider Number: [id number]  Attending Physician Name and Address:  Tawni Millers,*  Relative Name and Phone Number:  Meriam Chojnowski - daughter, [phone]    Current Level of Care: Hospital Recommended Level of Care: Vent SNF([facility]) Prior Approval Number:    Date Approved/Denied:   PASRR Number:    Discharge Plan: Other (Comment)([facility])    Current Diagnoses: Patient Active Problem List   Diagnosis Date Noted  . Pressure injury of skin 11/20/2018  . Fever 11/20/2018  . Bacteremia due to Klebsiella pneumoniae 11/13/2018  . UTI (urinary tract infection) 11/13/2018  . Sepsis ([place]) 11/12/2018  . Goals of care, counseling/discussion   . Palliative care by specialist   . Meningioma, cerebral ([place]) 09/27/2018  . Other emphysema ([place]) 09/27/2018  . Febrile illness, acute   . Severe sepsis with septic shock ([place]) 09/26/2018  . Hypernatremia 09/26/2018  . AKI (acute kidney injury) ([place]) 09/26/2018  . CVA (cerebral vascular accident) ([place]) 04/19/2018  . Chronic respiratory failure ([place]) 04/19/2018  . E-coli UTI, ESBL  06/30/2017  . A-fib ([place]) 06/30/2017  . Need for protective airway ventilation 06/30/2017  . Seizures ([place]) 06/26/2017  . Pressure ulcer 02/17/2017  . Bilateral lower extremity edema 10/08/2016  . Venous ulcer of ankle ([place]) 07/08/2016  . DVT (deep venous thrombosis) ([place]) 07/02/2016  . S/P coronary artery stent placement 10/30/2015  . 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 Mass of lower lobe of left lung, incidetnal on CT 09/2015, smoker.  09/29/2015  . Other  microscopic hematuria 08/26/2015  . Urge incontinence 08/26/2015  . Essential (primary) hypertension 12/09/2014  . Adnexal mass 11/28/2013  . History of cardiac catheterization 11/28/2013  . Hyperlipidemia 11/28/2013    Orientation RESPIRATION BLADDER Height & Weight        Vent, Tracheostomy(PC mode pressure 27, peep 5 Fi02 30 % & respiratory rate of 20; generating Vt 450 ml; no sigificant dyssynchrony) Incontinent, External catheter(placed 7/7) Weight: 175 lb 14.8 oz (79.8 kg) Height:  _0  (170.2 cm)  BEHAVIORAL SYMPTOMS/MOOD NEUROLOGICAL BOWEL NUTRITION STATUS    Convulsions/Seizures Incontinent Feeding tube(Gastrostomy/Enterostomy-Peg tube)  AMBULATORY STATUS COMMUNICATION OF NEEDS Skin   Total Care Verbally Other (Comment)(Pressure injuries: Heel-left & right unstageable; Sacrum mide stage3; Vetebral column mid-medial stg 3; Foot-left lateral unstageable; Mon-pressure wound anerior right medial arm)                       Personal Care Assistance Level of Assistance  Total care(Peg Tube) Bathing Assistance: Maximum assistance Feeding assistance: Maximum assistance Dressing Assistance: Maximum assistance Total Care Assistance: Maximum assistance   Functional Limitations Info  Sight, Hearing, Speech Sight Info: Adequate Hearing Info: Adequate Speech Info: Adequate    SPECIAL CARE FACTORS FREQUENCY                       Contractures Contractures Info: Not present    Additional Factors Info  Allergies, Insulin Sliding Scale, Suctioning Needs Code Status Info: Full Allergies Info: No known allergies   Insulin Sliding Scale Info: 0-9 Units every 4 hours   Suctioning Needs: Intermittent suctioning.  No greater than -120 mm/Hg.   Current Medications (11/24/2018):  This is the current hospital active medication list Current Facility-Administered Medications  Medication Dose Route Frequency Provider Last Rate Last Dose  . 0.9 %  sodium chloride infusion    Intravenous PRN Guilford Shi, MD 10 mL/hr at 11/24/18 0600    . acetaminophen (TYLENOL) solution 1,000 mg  1,000 mg Per Tube TID Guilford Shi, MD   1,000 mg at 11/24/18 0920  . acetaminophen (TYLENOL) solution 650 mg  650 mg Per Tube Q4H PRN Shelly Coss, MD   650 mg at 11/18/18 0432  . alteplase (CATHFLO ACTIVASE) injection 2 mg  2 mg Intracatheter Once PRN Thayer Headings, MD      . aspirin chewable tablet 81 mg  81 mg Per Tube Daily Milagros Loll, MD   81 mg at 11/24/18 5053  . bisacodyl (DULCOLAX) suppository 10 mg  10 mg Rectal Daily PRN Jacques Earthly T, MD      . chlorhexidine gluconate (MEDLINE KIT) (PERIDEX) 0.12 % solution 15 mL  15 mL Mouth Rinse BID Milagros Loll, MD   15 mL at 11/24/18 0746  . Chlorhexidine Gluconate Cloth 2 % PADS 6 each  6 each Topical Daily Milagros Loll, MD   6 each at 11/23/18 2125  . dextrose 5 % solution   Intravenous Continuous Tawni Millers, MD 75 mL/hr at 11/24/18 0600    . eravacycline (XERAVA) 115 mg in sodium chloride 0.9 % 250 mL IVPB  1.5 mg/kg Intravenous Q12H Susa Raring, RPH 250 mL/hr at 11/24/18 1027 115 mg at 11/24/18 1027  . feeding supplement (PIVOT 1.5 CAL) liquid 1,000 mL  1,000 mL Per Tube Continuous Icard, Bradley L, DO 60 mL/hr at 11/24/18 0205 1,000 mL at 11/24/18 0205  . fentaNYL (SUBLIMAZE) injection 25-100 mcg  25-100 mcg Intravenous Q30 min PRN Milagros Loll, MD   50 mcg at 11/23/18 2123  . free water 300 mL  300 mL Per Tube Q6H Arrien, Jimmy Picket, MD   300 mL at 11/24/18 [phone]  . insulin aspart (novoLOG) injection 0-9 Units  0-9 Units Subcutaneous Q4H Shelly Coss, MD   1 Units at 11/24/18 0908  . ipratropium-albuterol (DUONEB) 0.5-2.5 (3) MG/3ML nebulizer solution 3 mL  3 mL Nebulization Q6H PRN Milagros Loll, MD      . levETIRAcetam (KEPPRA) 100 MG/ML solution 1,000 mg  1,000 mg Per Tube BID Phillis Haggis, RPH   1,000 mg at 11/24/18 0919  . loperamide (IMODIUM) capsule 4 mg   4 mg Oral PRN Guilford Shi, MD   4 mg at 11/20/18 1002  . MEDLINE mouth rinse  15 mL Mouth Rinse 10 times per day Milagros Loll, MD   15 mL at 11/24/18 0928  . metoprolol tartrate (LOPRESSOR) 25 mg/10 mL oral suspension 12.5 mg  12.5 mg Per Tube BID Shelly Coss, MD   12.5 mg at 11/24/18 0919  . midodrine (PROAMATINE) tablet 5 mg  5 mg Per Tube TID WC Milagros Loll, MD   5 mg at 11/24/18 0745  . multivitamin liquid 15 mL  15 mL Per Tube Daily Milagros Loll, MD   15 mL at 11/24/18 0920  . nutrition supplement (JUVEN) (JUVEN) powder packet 1 packet  1 packet Per Tube BID Shelly Coss, MD   1 packet at 11/24/18 0745  . pantoprazole sodium (PROTONIX) 40 mg/20 mL oral suspension 40 mg  40 mg Per Tube Daily Icard, Leory Plowman  L, DO   40 mg at 11/24/18 0928  . phenytoin (DILANTIN) 160 mg in sodium chloride 0.9 % 100 mL IVPB  160 mg Intravenous Q8H Tawni Millers, MD 206.4 mL/hr at 11/24/18 0941 160 mg at 11/24/18 0941  . phosphorus (K PHOS NEUTRAL) tablet 500 mg  500 mg Per Tube BID Shelly Coss, MD   500 mg at 11/24/18 0919  . sodium chloride flush (NS) 0.9 % injection 10-40 mL  10-40 mL Intracatheter PRN Milagros Loll, MD      . traMADol Veatrice Bourbon) tablet 50 mg  50 mg Per Tube Q6H PRN Mannam, Praveen, MD   50 mg at 11/20/18 1626  . valproic acid (DEPAKENE) solution 250 mg  250 mg Per Tube BID Phillis Haggis, RPH   250 mg at 11/24/18 0920  . zinc sulfate capsule 220 mg  220 mg Per Tube Daily Icard, Bradley L, DO   220 mg at 11/24/18 8372     Discharge Medications: Please see discharge summary for a list of discharge medications.  Relevant Imaging Results:  Relevant Lab Results:   Additional Information Rectal tube placed 7/7; Wounds con't: Abrasion right arm; Blister right hand; Ecchymosis right/left arm; Skin tear left arm with foam dressing; MASD perineum, labia, buttocks   Vent information from MD 11/28/18 note: PC mode, with pressure of 27, peep 5 Fi02  30%, and respiratory rate of 20, generating Vt 450 ml.   Sable Feil, LCSW

## 2018-11-24 NOTE — Progress Notes (Signed)
NAME:  Melissa Cochran, MRN:  [id number], DOB:  1935/11/12, LOS: 25 ADMISSION DATE:  11/11/2018, CONSULTATION DATE: 11/11/2018 REFERRING MD: ED, CHIEF COMPLAINT: Sepsis  Brief History   83 year old woman with history of chronic hypoxic respiratory failure with ventilator/PEG tube dependence, CAD, HFrEF (EF 45-50% 06/2017), paroxysmal atrial fibrillation, adnexal mass, COPD, CVA with chronic encephalopathy, seizure disorder, DVT, GERD, hypertension, recurrent UTI presenting from [facility] with acute on chronic encephalopathy and hypotension found to have UTI.  History of present illness   83 year old woman with history of chronic hypoxic respiratory failure with ventilator/PEG tube dependence, CAD, HFrEF (EF 45-50% 06/2017), paroxysmal atrial fibrillation, adnexal mass, COPD, CVA with chronic encephalopathy, seizure disorder, DVT, GERD, hypertension, recurrent UTI presenting from [facility] with acute on chronic encephalopathy and hypotension.  Per EMR her baseline level of consciousness is eyes open and tracking.  Per daughter, she was corresponding with the [facility] nurses.  There were concerns on day of admission for fever, tachycardia.  At one point, she was reported to appear gray.  There was also report of emesis.  Past Medical History  Chronic hypoxic respiratory failure with ventilator/PEG tube dependence, CAD, HFrEF (EF 45-50% 06/2017), paroxysmal atrial fibrillation, adnexal mass, COPD, CVA with chronic encephalopathy, seizure disorder, DVT, GERD, hypertension, recurrent UTI  Significant Hospital Events   7/5> admit  Consults:  7/5: Palliative: [place] 7/6: Infectious Disease: [facility]  Procedures:  none  Significant Diagnostic Tests:  CT Chest/ab/pelvis 7/5-patchy tree-in-bud opacities, consolidation at the lung bases.  Mild mediastinal lymphadenopathy. Mild sigmoid diverticulitis, irregular liver surface, large left adnexal cystic mass.  Bilateral nephrolithiasis.  Chest x-ray 7/12-mild  peribronchial opacities bilaterally in the lower lobes.  I have reviewed the images personally.   Micro Data:  Blood cx 7/4> +Kleb pna, +enterbacteraceia, +CRE Urine cx 7/4> >100,000 colonies proteus mirabilis, 70,000 klebsiella Covid Negative  C diff negative  Resp Cx 7/5: Acinetobacter, stenotrophomonas. 7/14 resp: stenotrophomonas  Antimicrobials:  Vanc 7/4>7/5 Meropenem 7/4>7/5  Ceftaz-Avabactam 7/5 >>  Interim history/subjective:  7/17: no acute events overnight. 100.8 tmax  Objective   Blood pressure 110/63, pulse (!) 105, temperature 99.2 F (37.3 C), temperature source Oral, resp. rate (!) 28, height 5\' 7"  (1.702 m), weight 79.8 kg, SpO2 100 %.    Vent Mode: PCV FiO2 (%):  [30 %] 30 % Set Rate:  [20 bmp] 20 bmp PEEP:  [5 cmH20] 5 cmH20 Plateau Pressure:  [21 cmH20-29 cmH20] 28 cmH20   Intake/Output Summary (Last 24 hours) at 11/24/2018 0739 Last data filed at 11/24/2018 0600 Gross per 24 hour  Intake 4548.68 ml  Output 1525 ml  Net 3023.68 ml    Filed Weights   11/22/18 0354 11/23/18 0333 11/24/18 0341  Weight: 76.7 kg 78.9 kg 79.8 kg    Physical Exam:  Gen:      No acute distress, chronically ill-appearing HEENT:  PERRL, sclera anicteric, trach in place Neck:     No masses; no thyromegaly, trach Lungs:    Clear to auscultation bilaterally; normal respiratory effort CV:         Regular rate and rhythm; no murmurs Abd:      + bowel sounds hyperactive; soft, non-tender; no palpable masses, + distension Ext:    + edema; adequate peripheral perfusion Skin:      Warm and dry; no rash Neuro: Unresponsive  Assessment & Plan:   Septic Shock from presumed urinary tract infection  Possible ischemic bowel related to shock and supply/demand ischemia  MDRO Bacteremia, CRE Klebsiella pneumonia  Multiorgan Failure secondary to above  -Continue antibiotics -ID on board  Acute on Chronic Respiratory Failure, vent dependent respiratory failure  - full vent support,  wean as able to ps  - baseline vent dependent - Wean FiO2 to maintain SpO2 >90 COPD: Continue duo nebs Failing pressure support weans  Hypernatremia -Stagnant at 154 but improved from 163 -D5Water 25ml/hr  Continue free water  Transaminitis: improving Acute shock liver -LFTs improving.  Continue monitoring intermittently  Normocytic Anemia.  No evidence of active bleed. Hx IDA -Follow CBC.  Transfuse for less than 7.  Seizure history: -IV AEDs, dilantin, keppra, valproate  CAD, Prolonged qt:  -Continue aspirin. -Intermittent EKG.  Severe protein calorie malnutrition  Continue tube feeds  Goals of care Per palliative discussion with daughter, remains full code with full aggressive treatment  Best practice:  Diet: NG tube feeds Pain/Anxiety/Delirium protocol (if indicated): Fentanyl PRN. Add tramadol VAP protocol (if indicated): Ordered DVT prophylaxis: SubQ Lovenox GI prophylaxis: Protonix Glucose control: Monitor Mobility: PT ok from ccm standpoint Code Status: Full code Family Communication: per primary Disposition: On [place] service. PCCM to follow for vent management. 1-2x weekly  Critical care time: The patient is critically ill with multiple organ systems failure and requires high complexity decision making for assessment and support, frequent evaluation and titration of therapies, application of advanced monitoring technologies and extensive interpretation of multiple databases.  Critical care time 33 mins. This represents my time independent of the NPs time taking care of the pt. This is excluding procedures.    Audria Nine DO Pager: [phone] After hours pager: [phone]  [facility] 11/24/2018, 7:39 AM

## 2018-11-25 LAB — BASIC METABOLIC PANEL
Anion gap: 6 (ref 5–15)
BUN: 64 mg/dL — ABNORMAL HIGH (ref 8–23)
CO2: 20 mmol/L — ABNORMAL LOW (ref 22–32)
Calcium: 8.5 mg/dL — ABNORMAL LOW (ref 8.9–10.3)
Chloride: 127 mmol/L — ABNORMAL HIGH (ref 98–111)
Creatinine, Ser: 0.52 mg/dL (ref 0.44–1.00)
GFR calc Af Amer: >60 mL/min (ref >60)
GFR calc non Af Amer: >60 mL/min (ref >60)
Glucose, Bld: 106 mg/dL — ABNORMAL HIGH (ref 70–99)
Potassium: 3.7 mmol/L (ref 3.5–5.1)
Sodium: 153 mmol/L — ABNORMAL HIGH (ref 135–145)

## 2018-11-25 LAB — GLUCOSE, CAPILLARY
Glucose-Capillary: 103 mg/dL — ABNORMAL HIGH (ref 70–99)
Glucose-Capillary: 105 mg/dL — ABNORMAL HIGH (ref 70–99)
Glucose-Capillary: 105 mg/dL — ABNORMAL HIGH (ref 70–99)
Glucose-Capillary: 109 mg/dL — ABNORMAL HIGH (ref 70–99)
Glucose-Capillary: 124 mg/dL — ABNORMAL HIGH (ref 70–99)
Glucose-Capillary: 142 mg/dL — ABNORMAL HIGH (ref 70–99)
Glucose-Capillary: 98 mg/dL (ref 70–99)

## 2018-11-25 MED ORDER — CHLORHEXIDINE GLUCONATE 0.12 % MT SOLN
OROMUCOSAL | Status: AC
  Filled 2018-11-25: qty 15

## 2018-11-25 NOTE — Progress Notes (Signed)
PROGRESS NOTE    Melissa Cochran  BHA:[id number] DOB: 1935-10-23 DOA: 11/11/2018 PCP: [name], MD    Brief Narrative:  83 year old female who presented with sepsis. She does have significant past medical history for chronic hypoxic respiratory failure, ventilator dependent., coronaryareterydisease, heart failure, paroxysmal atrialfibrillation, COPD and CVA. She also has seizures,DVT,GERD and hypertension.  She is a resident from [facility], she was transferred due to acute on chronic encephalopathy and hypotension related to urinary tract infection. She does have a poor baseline,onlytracking with her eyes. On her initial physical examination her blood pressure was 90/57, pulse rate 102, temperature 38.3 C, respiratory rate 24 oxygen saturation 98%.She was unresponsive, trach in place, scattered rhonchi bilaterally, heart S1-S2 present regular, abdomen soft, no lower extremity edema.  Sodium 130, potassium 5.1, chloride 98, bicarb 13, glucose 125, BUN 57, creatinine 1.68, white count 45.6, hemoglobin 9.7, hematocrit 39.3, platelets 387.SARS COVID-19 negative. Urinalysis had specific gravity 1.020, few bacteria, moderate leukocytes. Stool for C. difficile was negative. Her chest radiograph had bibasilar atelectasis, CT of the chest and abdomen no acute changes.EKG 106 bpm, normal axis, normal intervals (corrected QTC 432),septal Q waves, no significant ST-T segment or T wave changes.  Patient was admitted with a working diagnosis of sepsis due to urinary tract infection.  Patient developed shock due to gram-negative bacteremia,Klebsiella Pneumonia,she required vasopressors and broad-spectrum antibiotic therapy.  Persistent fevers, due to tracheobronchitis-ventilator associated pneumonia (not present on admission) due to stenotrophomonas maltophilia and MDR acinetobacter.   Assessment & Plan:   Principal Problem:   Fever Active Problems:   Chronic  respiratory failure (HCC)   Sepsis (HCC)   Bacteremia due to Klebsiella pneumoniae   UTI (urinary tract infection)   Pressure injury of skin   1.  Septic shock due to gram-negative bacteremia/Klebsiella, urinary tract infection, present on admission.  Continue to be hemodynamically stable, completed antibiotic for urine infection.   2.  Ventilator associated pneumonia/tracheobronchitis, not present on admission.  Positive for stenotrophomonas and MDR Acinetobacter. Continue with IV eravacycline to complete antibiotic therapy on July 22.  3.Chronic systolic heart failure. On midodrine.  4.Paroxysmal atrial fibrillation.Rate control with metoprolol, continue asa.  5.Ventilator dependent respiratory failure, hypercapnic and hypoxic, acute on chronic.Continue mandatory mode of mechanical ventilation. Patient not candidate for liberation trial due to cns dysfunction.   PC mode, with pressure of 27, peep 5 Fi02 30%, and respiratory rate of 20, generating Vt 450 ml. No significant dyssynchrony.   5.Hyponatremia/ hypernatremia.Na trending down today at 153, will continue free water flushes and will decrease D5 water to 50 ml per H, check renal panel in am. Preserved renal function with serum cr at 0.52.   6.Seizures.Continue with valproic acidKeppra and phenytoin, no signs of active seizures.   7.COPD.No clinical exacerbation.   8.Chronic anemia.stable cell count.   9.Chronic encephalopathy.Continue patient to be poorly interactive. Grimacing to touch. Not opening her eyes.   10.Multiple pressure ulcers,right heel (unstageable), sacrum (stage III), vertebral column (stage III), left foot (unstageable)/ present on admission. Local wound care. Continue pain control with fentanyl.   11.Type 2 diabetes mellitus.glucose this am 106, will continue glucose cover and monitoring with insulin sliding scale.   DVT prophylaxis: enoxaparin   Code  Status: full Family Communication: no family at the bedside  Disposition Plan/ discharge barriers: pending transfer to [place].   Body mass index is 29.32 kg/m. Malnutrition Type:  Nutrition Problem: Increased nutrient needs Etiology: acute illness, chronic illness, wound healing   Malnutrition Characteristics:  Signs/Symptoms: estimated needs   Nutrition Interventions:  Interventions: Juven, Tube feeding  RN Pressure Injury Documentation: Pressure Injury 11/19/18 Heel Left Unstageable - Full thickness tissue loss in which the base of the ulcer is covered by slough (yellow, tan, gray, green or brown) and/or eschar (tan, brown or black) in the wound bed. (Active)  11/19/18 0800  Location: Heel  Location Orientation: Left  Staging: Unstageable - Full thickness tissue loss in which the base of the ulcer is covered by slough (yellow, tan, gray, green or brown) and/or eschar (tan, brown or black) in the wound bed.  Wound Description (Comments):   Present on Admission:      Pressure Injury 11/19/18 Heel Right Unstageable - Full thickness tissue loss in which the base of the ulcer is covered by slough (yellow, tan, gray, green or brown) and/or eschar (tan, brown or black) in the wound bed. (Active)  11/19/18 0800  Location: Heel  Location Orientation: Right  Staging: Unstageable - Full thickness tissue loss in which the base of the ulcer is covered by slough (yellow, tan, gray, green or brown) and/or eschar (tan, brown or black) in the wound bed.  Wound Description (Comments):   Present on Admission:      Pressure Injury 11/19/18 Sacrum Mid Stage III -  Full thickness tissue loss. Subcutaneous fat may be visible but bone, tendon or muscle are NOT exposed. (Active)  11/19/18 0800  Location: Sacrum  Location Orientation: Mid  Staging: Stage III -  Full thickness tissue loss. Subcutaneous fat may be visible but bone, tendon or muscle are NOT exposed.  Wound Description (Comments):    Present on Admission:      Pressure Injury 11/19/18 Vertebral column Mid;Medial Stage III -  Full thickness tissue loss. Subcutaneous fat may be visible but bone, tendon or muscle are NOT exposed. (Active)  11/19/18 0800  Location: Vertebral column  Location Orientation: Mid;Medial  Staging: Stage III -  Full thickness tissue loss. Subcutaneous fat may be visible but bone, tendon or muscle are NOT exposed.  Wound Description (Comments):   Present on Admission:      Pressure Injury 11/19/18 Foot Left;Lateral Unstageable - Full thickness tissue loss in which the base of the ulcer is covered by slough (yellow, tan, gray, green or brown) and/or eschar (tan, brown or black) in the wound bed. (Active)  11/19/18 0800  Location: Foot  Location Orientation: Left;Lateral  Staging: Unstageable - Full thickness tissue loss in which the base of the ulcer is covered by slough (yellow, tan, gray, green or brown) and/or eschar (tan, brown or black) in the wound bed.  Wound Description (Comments):   Present on Admission:      Consultants:   ID  Procedures:     Antimicrobials:   Eravacyline.     Subjective: Patient has been tolerating well tube feedings and mechanical ventilation, only grimacing to pain and touch, not opening her eyes. Picc line has been exchanged.   Objective: Vitals:   11/25/18 0700 11/25/18 0729 11/25/18 0800 11/25/18 0900  BP: 104/63 104/63 113/85 108/62  Pulse: 95  (!) 101 95  Resp: (!) 35 (!) 25  (!) 26  Temp:   100.1 F (37.8 C)   TempSrc:   Oral   SpO2: 100% 100% 95% 100%  Weight:      Height:        Intake/Output Summary (Last 24 hours) at 11/25/2018 0947 Last data filed at 11/25/2018 0900 Gross per 24 hour  Intake 6576.27 ml  Output 1750 ml  Net 4826.27 ml   Filed Weights   11/23/18 0333 11/24/18 0341 11/25/18 0500  Weight: 78.9 kg 79.8 kg 84.9 kg    Examination:   General: deconditioned and ill looking appearing  Neurology: not responsive,  only grimacing to pain, not following commands.  E ENT: trach in place.  Cardiovascular: No JVD. S1-S2 present, rhythmic, no gallops, rubs, or murmurs. No lower extremity edema. Pulmonary: positive breath sounds bilaterally, adequate air movement, no wheezing, rhonchi or rales. Gastrointestinal. Abdomen with, no organomegaly, non tender, no rebound or guarding/ peg tube in place.  Skin. Multiple pressure ulcers.   Musculoskeletal: no joint deformities     Data Reviewed: I have personally reviewed following labs and imaging studies  CBC: Recent Labs  Lab 11/19/18 0430 11/20/18 0404 11/22/18 0404 11/23/18 0333 11/24/18 0348  WBC 12.4* 14.7* 11.3* 13.2* 11.7*  NEUTROABS  --   --  8.7* 10.3* 8.3*  HGB 7.7* 7.6* 7.3* 7.5* 7.3*  HCT 27.6* 27.0* 26.8* 28.1* 26.9*  MCV 102.6* 103.1* 106.3* 108.1* 107.2*  PLT 271 287 298 300 474   Basic Metabolic Panel: Recent Labs  Lab 11/22/18 0930 11/22/18 1722 11/23/18 0333 11/24/18 0348 11/25/18 0428  NA 163* 159* 154* 154* 153*  K 3.7 3.4* 3.4* 3.5 3.7  CL >130* >130* 125* 127* 127*  CO2 22 20* 19* 20* 20*  GLUCOSE 114* 196* 203* 112* 106*  BUN 55* 56* 56* 66* 64*  CREATININE 0.53 0.61 0.52 0.51 0.52  CALCIUM 8.9 8.6* 8.4* 8.6* 8.5*   GFR: Estimated Creatinine Clearance: 61.7 mL/min (by C-G formula based on SCr of 0.52 mg/dL). Liver Function Tests: Recent Labs  Lab 11/24/18 0348  AST 22  ALT 45*  ALKPHOS 106  BILITOT 0.2*  PROT 5.2*  ALBUMIN 1.6*   No results for input(s): LIPASE, AMYLASE in the last 168 hours. No results for input(s): AMMONIA in the last 168 hours. Coagulation Profile: No results for input(s): INR, PROTIME in the last 168 hours. Cardiac Enzymes: No results for input(s): CKTOTAL, CKMB, CKMBINDEX, TROPONINI in the last 168 hours. BNP (last 3 results) No results for input(s): PROBNP in the last 8760 hours. HbA1C: No results for input(s): HGBA1C in the last 72 hours. CBG: Recent Labs  Lab 11/24/18 1953  11/24/18 2343 11/25/18 0401 11/25/18 0806 11/25/18 0831  GLUCAP 120* 124* 103* 109* 105*   Lipid Profile: No results for input(s): CHOL, HDL, LDLCALC, TRIG, CHOLHDL, LDLDIRECT in the last 72 hours. Thyroid Function Tests: No results for input(s): TSH, T4TOTAL, FREET4, T3FREE, THYROIDAB in the last 72 hours. Anemia Panel: No results for input(s): VITAMINB12, FOLATE, FERRITIN, TIBC, IRON, RETICCTPCT in the last 72 hours.    Radiology Studies: I have reviewed all of the imaging during this hospital visit personally     Scheduled Meds: . acetaminophen (TYLENOL) oral liquid 160 mg/5 mL  1,000 mg Per Tube TID  . aspirin  81 mg Per Tube Daily  . chlorhexidine gluconate (MEDLINE KIT)  15 mL Mouth Rinse BID  . [START ON 11/26/2018] Chlorhexidine Gluconate Cloth  6 each Topical Q0600  . free water  300 mL Per Tube Q4H  . insulin aspart  0-9 Units Subcutaneous Q4H  . levETIRAcetam  1,000 mg Per Tube BID  . mouth rinse  15 mL Mouth Rinse 10 times per day  . metoprolol tartrate  12.5 mg Per Tube BID  . midodrine  5 mg Per Tube TID WC  . multivitamin  15 mL Per Tube  Daily  . nutrition supplement (JUVEN)  1 packet Per Tube BID  . pantoprazole sodium  40 mg Per Tube Daily  . phosphorus  500 mg Per Tube BID  . sodium chloride flush  10-40 mL Intracatheter Q12H  . valproic acid  250 mg Per Tube BID  . zinc sulfate  220 mg Per Tube Daily   Continuous Infusions: . sodium chloride 10 mL/hr at 11/25/18 0900  . dextrose 75 mL/hr at 11/25/18 0900  . eravacycline Stopped (11/24/18 2344)  . feeding supplement (PIVOT 1.5 CAL) 60 mL/hr at 11/25/18 0800  . phenytoin (DILANTIN) IV Stopped (11/25/18 3295)     LOS: 13 days        Mauricio Gerome Apley, MD

## 2018-11-26 LAB — GLUCOSE, CAPILLARY
Glucose-Capillary: 101 mg/dL — ABNORMAL HIGH (ref 70–99)
Glucose-Capillary: 109 mg/dL — ABNORMAL HIGH (ref 70–99)
Glucose-Capillary: 116 mg/dL — ABNORMAL HIGH (ref 70–99)
Glucose-Capillary: 134 mg/dL — ABNORMAL HIGH (ref 70–99)
Glucose-Capillary: 84 mg/dL (ref 70–99)
Glucose-Capillary: 99 mg/dL (ref 70–99)

## 2018-11-26 LAB — BASIC METABOLIC PANEL
Anion gap: 7 (ref 5–15)
BUN: 70 mg/dL — ABNORMAL HIGH (ref 8–23)
CO2: 18 mmol/L — ABNORMAL LOW (ref 22–32)
Calcium: 8 mg/dL — ABNORMAL LOW (ref 8.9–10.3)
Chloride: 118 mmol/L — ABNORMAL HIGH (ref 98–111)
Creatinine, Ser: 0.65 mg/dL (ref 0.44–1.00)
GFR calc Af Amer: >60 mL/min (ref >60)
GFR calc non Af Amer: >60 mL/min (ref >60)
Glucose, Bld: 260 mg/dL — ABNORMAL HIGH (ref 70–99)
Potassium: 4 mmol/L (ref 3.5–5.1)
Sodium: 143 mmol/L (ref 135–145)

## 2018-11-26 MED ORDER — DIPHENOXYLATE-ATROPINE 2.5-0.025 MG PO TABS
1.0000 | ORAL_TABLET | Freq: Four times a day (QID) | ORAL | Status: DC | PRN
  Administered 2018-11-26 – 2018-11-27 (×3): 1
  Filled 2018-11-26 (×3): qty 1

## 2018-11-26 MED ORDER — PHENYTOIN 125 MG/5ML PO SUSP
160.0000 mg | Freq: Three times a day (TID) | ORAL | Status: DC
  Administered 2018-11-26 – 2018-11-29 (×9): 160 mg
  Filled 2018-11-26 (×12): qty 8

## 2018-11-26 MED ORDER — DIPHENOXYLATE-ATROPINE 2.5-0.025 MG/5ML PO LIQD: 5.0000 mL | Freq: Four times a day (QID) | ORAL | Status: DC | PRN

## 2018-11-26 NOTE — Progress Notes (Signed)
PROGRESS NOTE    Melissa IRIGOYEN  ZYS:[id number] DOB: [birth year] DOA: 11/11/2018 PCP: [name], MD    Brief Narrative:  83 year old female who presented with sepsis. She does have significant past medical history for chronic hypoxic respiratory failure, ventilator dependent., coronaryareterydisease, heart failure, paroxysmal atrialfibrillation, COPD and CVA. She also has seizures,DVT,GERD and hypertension.  She is a resident from [facility], she was transferred due to acute on chronic encephalopathy and hypotension related to urinary tract infection. She does have a poor baseline,onlytracking with her eyes. On her initial physical examination her blood pressure was 90/57, pulse rate 102, temperature 38.3 C, respiratory rate 24 oxygen saturation 98%.She was unresponsive, trach in place, scattered rhonchi bilaterally, heart S1-S2 present regular, abdomen soft, no lower extremity edema.  Sodium 130, potassium 5.1, chloride 98, bicarb 13, glucose 125, BUN 57, creatinine 1.68, white count 45.6, hemoglobin 9.7, hematocrit 39.3, platelets 387.SARS COVID-19 negative. Urinalysis had specific gravity 1.020, few bacteria, moderate leukocytes. Stool for C. difficile was negative. Her chest radiograph had bibasilar atelectasis, CT of the chest and abdomen no acute changes.EKG 106 bpm, normal axis, normal intervals (corrected QTC 432),septal Q waves, no significant ST-T segment or T wave changes.  Patient was admitted with a working diagnosis of sepsis due to urinary tract infection.  Patient developed shock due to gram-negative bacteremia,Klebsiella Pneumonia,she required vasopressors and broad-spectrum antibiotic therapy.  Persistent fevers, due to tracheobronchitis-ventilator associated pneumonia (not present on admission) due to stenotrophomonas maltophilia and MDR acinetobacter.   Assessment & Plan:   Principal Problem:   Fever Active Problems:   Chronic  respiratory failure (HCC)   Sepsis (HCC)   Bacteremia due to Klebsiella pneumoniae   UTI (urinary tract infection)   Pressure injury of skin    1.Septic shock due to gram-negative bacteremia/Klebsiella, urinary tract infection, present on admission.Blood pressure continue to be stable.   2.Ventilator associated pneumonia/tracheobronchitis, not present on admission.Positive for stenotrophomonas and MDR Acinetobacter.Plan to continue IV eravacyclinetill July 22 per ID recommendations.   3.Chronic systolic heart failure. Continue with midodrine.  4.Paroxysmal atrial fibrillation.On metoprolol for rate control. Antiplatelet surgery with asa.  5.Ventilator dependent respiratory failure, hypercapnic and hypoxic, acute on chronic.No significant dyssynchrony on mandatory mechanical ventilation.   PC mode, with pressure of 27, peep 5 Fi02 30%, and respiratory rate of 20, generating Vt 450 ml. No significant dyssynchrony.  5.Hyponatremia/ hypernatremia.Na at 143 today, will continue free water flushes and will dc IV d5 w, renal function continue stable with serum cr at 0,65. Tolerating well tube feedings.   6.Seizures.Antiepileptic regimen with valproic acid,Keppra and phenytoin, per tube.   7.COPD.No signs of clinical exacerbation.   8.Chronic anemia.Follow cell count only as needed.   9.Chronic encephalopathy.No significant change, patient  grimacing to touch. Not opening her eyes. Continue pain control.   10.Multiple pressure ulcers,right heel (unstageable), sacrum (stage III), vertebral column (stage III), left foot (unstageable)/ present on admission. Continue with local wound care. Fentanyl for pain control.   11.Type 2 diabetes mellitus.Continue glucose cover and monitoring with insulin sliding scale.  12. Persistent diarrhea. Continue as needed anti-motily agents.   DVT prophylaxis: enoxaparin   Code Status: full  Family Communication: no family at the bedside  Disposition Plan/ discharge barriers: pending transfer to [facility]  Body mass index is 28.62 kg/m. Malnutrition Type:  Nutrition Problem: Increased nutrient needs Etiology: acute illness, chronic illness, wound healing   Malnutrition Characteristics:  Signs/Symptoms: estimated needs   Nutrition Interventions:  Interventions: Juven, Tube feeding  RN Pressure  Injury Documentation: Pressure Injury 11/19/18 Heel Left Unstageable - Full thickness tissue loss in which the base of the ulcer is covered by slough (yellow, tan, gray, green or brown) and/or eschar (tan, brown or black) in the wound bed. (Active)  11/19/18 0800  Location: Heel  Location Orientation: Left  Staging: Unstageable - Full thickness tissue loss in which the base of the ulcer is covered by slough (yellow, tan, gray, green or brown) and/or eschar (tan, brown or black) in the wound bed.  Wound Description (Comments):   Present on Admission:      Pressure Injury 11/19/18 Heel Right Unstageable - Full thickness tissue loss in which the base of the ulcer is covered by slough (yellow, tan, gray, green or brown) and/or eschar (tan, brown or black) in the wound bed. (Active)  11/19/18 0800  Location: Heel  Location Orientation: Right  Staging: Unstageable - Full thickness tissue loss in which the base of the ulcer is covered by slough (yellow, tan, gray, green or brown) and/or eschar (tan, brown or black) in the wound bed.  Wound Description (Comments):   Present on Admission:      Pressure Injury 11/19/18 Sacrum Mid Stage III -  Full thickness tissue loss. Subcutaneous fat may be visible but bone, tendon or muscle are NOT exposed. (Active)  11/19/18 0800  Location: Sacrum  Location Orientation: Mid  Staging: Stage III -  Full thickness tissue loss. Subcutaneous fat may be visible but bone, tendon or muscle are NOT exposed.  Wound Description (Comments):   Present on  Admission:      Pressure Injury 11/19/18 Vertebral column Mid;Medial Stage III -  Full thickness tissue loss. Subcutaneous fat may be visible but bone, tendon or muscle are NOT exposed. (Active)  11/19/18 0800  Location: Vertebral column  Location Orientation: Mid;Medial  Staging: Stage III -  Full thickness tissue loss. Subcutaneous fat may be visible but bone, tendon or muscle are NOT exposed.  Wound Description (Comments):   Present on Admission:      Pressure Injury 11/19/18 Foot Left;Lateral Unstageable - Full thickness tissue loss in which the base of the ulcer is covered by slough (yellow, tan, gray, green or brown) and/or eschar (tan, brown or black) in the wound bed. (Active)  11/19/18 0800  Location: Foot  Location Orientation: Left;Lateral  Staging: Unstageable - Full thickness tissue loss in which the base of the ulcer is covered by slough (yellow, tan, gray, green or brown) and/or eschar (tan, brown or black) in the wound bed.  Wound Description (Comments):   Present on Admission:      Consultants:   ID  Procedures:     Antimicrobials:       Subjective: Patient with persistent diarrhea, continue to be not interactive, continue on mechanical ventilation and tube feedings.   Objective: Vitals:   11/26/18 0700 11/26/18 0712 11/26/18 0819 11/26/18 0927  BP: 101/65 101/65  101/65  Pulse: 97 94    Resp: (!) 32 (!) 28    Temp:   99.9 F (37.7 C)   TempSrc:   Oral   SpO2: 100% 100%    Weight:      Height:        Intake/Output Summary (Last 24 hours) at 11/26/2018 1115 Last data filed at 11/26/2018 0700 Gross per 24 hour  Intake 4961.5 ml  Output 450 ml  Net 4511.5 ml   Filed Weights   11/24/18 0341 11/25/18 0500 11/26/18 0100  Weight: 79.8 kg 84.9 kg 82.9  kg    Examination:   General: deconditioned and ill looking appearing  Neurology: Awake and alert, non focal  E ENT: mild pallor, trach in place.  Cardiovascular: No JVD. S1-S2 present, rhythmic,  no gallops, rubs, or murmurs. No lower extremity edema. Pulmonary: positive breath sounds bilaterally, adequate air movement, no wheezing, rhonchi or rales. Gastrointestinal. Abdomen with, no organomegaly, non tender, no rebound or guarding Skin. No rashes Musculoskeletal: no joint deformities     Data Reviewed: I have personally reviewed following labs and imaging studies  CBC: Recent Labs  Lab 11/20/18 0404 11/22/18 0404 11/23/18 0333 11/24/18 0348  WBC 14.7* 11.3* 13.2* 11.7*  NEUTROABS  --  8.7* 10.3* 8.3*  HGB 7.6* 7.3* 7.5* 7.3*  HCT 27.0* 26.8* 28.1* 26.9*  MCV 103.1* 106.3* 108.1* 107.2*  PLT 287 298 300 867   Basic Metabolic Panel: Recent Labs  Lab 11/22/18 0930 11/22/18 1722 11/23/18 0333 11/24/18 0348 11/25/18 0428  NA 163* 159* 154* 154* 153*  K 3.7 3.4* 3.4* 3.5 3.7  CL >130* >130* 125* 127* 127*  CO2 22 20* 19* 20* 20*  GLUCOSE 114* 196* 203* 112* 106*  BUN 55* 56* 56* 66* 64*  CREATININE 0.53 0.61 0.52 0.51 0.52  CALCIUM 8.9 8.6* 8.4* 8.6* 8.5*   GFR: Estimated Creatinine Clearance: 61 mL/min (by C-G formula based on SCr of 0.52 mg/dL). Liver Function Tests: Recent Labs  Lab 11/24/18 0348  AST 22  ALT 45*  ALKPHOS 106  BILITOT 0.2*  PROT 5.2*  ALBUMIN 1.6*   No results for input(s): LIPASE, AMYLASE in the last 168 hours. No results for input(s): AMMONIA in the last 168 hours. Coagulation Profile: No results for input(s): INR, PROTIME in the last 168 hours. Cardiac Enzymes: No results for input(s): CKTOTAL, CKMB, CKMBINDEX, TROPONINI in the last 168 hours. BNP (last 3 results) No results for input(s): PROBNP in the last 8760 hours. HbA1C: No results for input(s): HGBA1C in the last 72 hours. CBG: Recent Labs  Lab 11/25/18 1536 11/25/18 2002 11/26/18 0000 11/26/18 0347 11/26/18 0819  GLUCAP 98 105* 134* 99 101*   Lipid Profile: No results for input(s): CHOL, HDL, LDLCALC, TRIG, CHOLHDL, LDLDIRECT in the last 72 hours. Thyroid  Function Tests: No results for input(s): TSH, T4TOTAL, FREET4, T3FREE, THYROIDAB in the last 72 hours. Anemia Panel: No results for input(s): VITAMINB12, FOLATE, FERRITIN, TIBC, IRON, RETICCTPCT in the last 72 hours.    Radiology Studies: I have reviewed all of the imaging during this hospital visit personally     Scheduled Meds: . acetaminophen (TYLENOL) oral liquid 160 mg/5 mL  1,000 mg Per Tube TID  . aspirin  81 mg Per Tube Daily  . chlorhexidine gluconate (MEDLINE KIT)  15 mL Mouth Rinse BID  . Chlorhexidine Gluconate Cloth  6 each Topical Q0600  . free water  300 mL Per Tube Q4H  . insulin aspart  0-9 Units Subcutaneous Q4H  . levETIRAcetam  1,000 mg Per Tube BID  . mouth rinse  15 mL Mouth Rinse 10 times per day  . metoprolol tartrate  12.5 mg Per Tube BID  . midodrine  5 mg Per Tube TID WC  . multivitamin  15 mL Per Tube Daily  . nutrition supplement (JUVEN)  1 packet Per Tube BID  . pantoprazole sodium  40 mg Per Tube Daily  . phosphorus  500 mg Per Tube BID  . sodium chloride flush  10-40 mL Intracatheter Q12H  . valproic acid  250 mg Per  Tube BID  . zinc sulfate  220 mg Per Tube Daily   Continuous Infusions: . sodium chloride 250 mL (11/26/18 0642)  . dextrose 50 mL/hr at 11/26/18 0700  . eravacycline 115 mg (11/26/18 0931)  . feeding supplement (PIVOT 1.5 CAL) 1,000 mL (11/25/18 1752)  . phenytoin (DILANTIN) IV 206 mL/hr at 11/26/18 0700     LOS: 14 days        Kristol Almanzar Gerome Apley, MD

## 2018-11-27 LAB — GLUCOSE, CAPILLARY
Glucose-Capillary: 103 mg/dL — ABNORMAL HIGH (ref 70–99)
Glucose-Capillary: 109 mg/dL — ABNORMAL HIGH (ref 70–99)
Glucose-Capillary: 114 mg/dL — ABNORMAL HIGH (ref 70–99)
Glucose-Capillary: 116 mg/dL — ABNORMAL HIGH (ref 70–99)
Glucose-Capillary: 120 mg/dL — ABNORMAL HIGH (ref 70–99)
Glucose-Capillary: 133 mg/dL — ABNORMAL HIGH (ref 70–99)
Glucose-Capillary: 85 mg/dL (ref 70–99)

## 2018-11-27 LAB — PHENYTOIN LEVEL, TOTAL: Phenytoin Lvl: 5.1 ug/mL — ABNORMAL LOW (ref 10.0–20.0)

## 2018-11-27 LAB — SARS CORONAVIRUS 2 BY RT PCR (HOSPITAL ORDER, PERFORMED IN ~~LOC~~ HOSPITAL LAB): SARS Coronavirus 2: NEGATIVE

## 2018-11-27 MED ORDER — DIPHENOXYLATE-ATROPINE 2.5-0.025 MG PO TABS: 1.0000 | ORAL_TABLET | Freq: Four times a day (QID) | ORAL | 0 refills | Status: AC | PRN

## 2018-11-27 NOTE — Progress Notes (Signed)
NAME:  Melissa Cochran, MRN:  [id number], DOB:  08/04/1935, LOS: 56 ADMISSION DATE:  11/11/2018, CONSULTATION DATE: 11/11/2018 REFERRING MD: ED, CHIEF COMPLAINT: Sepsis  Brief History   83 year old woman with history of chronic hypoxic respiratory failure with ventilator/PEG tube dependence, CAD, HFrEF (EF 45-50% 06/2017), paroxysmal atrial fibrillation, adnexal mass, COPD, CVA with chronic encephalopathy, seizure disorder, DVT, GERD, hypertension, recurrent UTI presenting from [facility] with acute on chronic encephalopathy and hypotension found to have proteus UTI & KPC Klebsiella bacteremia  Per EMR her baseline level of consciousness is eyes open and tracking.    Past Medical History  Chronic hypoxic respiratory failure with ventilator/PEG tube dependence, CAD, HFrEF (EF 45-50% 06/2017), paroxysmal atrial fibrillation, adnexal mass, COPD, CVA with chronic encephalopathy, seizure disorder, DVT, GERD, hypertension, recurrent UTI  Significant Hospital Events   7/5> admit  Consults:  7/5: Palliative: [place] 7/6: Infectious Disease: CRE  Procedures:  none  Significant Diagnostic Tests:  CT Chest/ab/pelvis 7/5-patchy tree-in-bud opacities, consolidation at the lung bases.  Mild mediastinal lymphadenopathy. Mild sigmoid diverticulitis, irregular liver surface, large left adnexal cystic mass.  Bilateral nephrolithiasis.    Micro Data:  Blood cx 7/4> +Kleb pna, +enterbacteraceia, +CRE Urine cx 7/4> >100,000 colonies proteus mirabilis, 70,000 klebsiella Covid Negative  C diff negative  Resp Cx 7/5: Acinetobacter, stenotrophomonas. 7/14 resp: stenotrophomonas  Antimicrobials:  Vanc 7/4>7/5 Meropenem 7/4>7/5  Ceftaz-Avabactam 7/5 >>  Interim history/subjective:   Afebrile Unresponsive  Objective   Blood pressure 105/67, pulse 76, temperature 99.2 F (37.3 C), temperature source Oral, resp. rate (!) 22, height 5\' 7"  (1.702 m), weight 85.5 kg, SpO2 100 %.    Vent Mode: PCV FiO2  (%):  [30 %] 30 % Set Rate:  [20 bmp] 20 bmp PEEP:  [5 cmH20] 5 cmH20 Plateau Pressure:  [25 cmH20-29 cmH20] 26 cmH20   Intake/Output Summary (Last 24 hours) at 11/27/2018 1428 Last data filed at 11/27/2018 1300 Gross per 24 hour  Intake 4041.83 ml  Output 1300 ml  Net 2741.83 ml    Filed Weights   11/25/18 0500 11/26/18 0100 11/27/18 0328  Weight: 84.9 kg 82.9 kg 85.5 kg    Physical Exam:  Gen:   Elderly, chronically ill-appearing, no distress HEENT:  PERRL, sclera anicteric, trach in place Neck:     No masses; no thyromegaly, trach Lungs:    Clear to auscultation bilaterally; normal respiratory effort CV:         Regular rate and rhythm; no murmurs Abd:      + bowel sounds hyperactive; soft, non-tender; no palpable masses, + distension Ext:    + edema; adequate peripheral perfusion Skin:      Warm and dry; no rash Neuro: Unresponsive, does not follow commands  Assessment & Plan:   Septic Shock from presumed urinary tract infection  Possible ischemic bowel related to shock and supply/demand ischemia  MDRO Bacteremia, CRE Klebsiella  Respiratory culture with stenotrophomonas and MDR Acinetobacter Multiorgan Failure secondary to above  -Continue antibiotics per ID/ erava cycline   Acute on Chronic Respiratory Failure, vent dependent respiratory failure  COPD: Continue duo nebs Failing pressure support weans   Seizure history: -IV AEDs, dilantin, keppra, valproate  Rest per primary team  Goals of care -although daughter seems to have insisted on full scope of care, I do not feel that pressors, CPR or ACLS would be helpful to her and would not offer these interventions.   Kara Mead MD. Shade Flood. [facility] Pager [phone] If no  response call [phone]   11/27/2018

## 2018-11-27 NOTE — Plan of Care (Signed)
  Problem: Nutrition: Goal: Adequate nutrition will be maintained Outcome: Progressing   Problem: Elimination: Goal: Will not experience complications related to bowel motility Outcome: Progressing   Problem: Safety: Goal: Ability to remain free from injury will improve Outcome: Progressing   Problem: Skin Integrity: Goal: Risk for impaired skin integrity will decrease Outcome: Not Progressing Note: Due to extensive diarrhea, unable to maintain skin integrity. Pt has no rectal tone for a flexiseal and her perineum does not allow a rectal pouch to be used.

## 2018-11-27 NOTE — Progress Notes (Signed)
PROGRESS NOTE    Melissa Cochran  NFA:[id number] DOB: April 16, 1936 DOA: 11/11/2018 PCP: [name], MD    Brief Narrative:  83 year old female who presented with sepsis. She does have significant past medical history for chronic hypoxic respiratory failure, ventilator dependent., coronaryareterydisease, heart failure, paroxysmal atrialfibrillation, COPD and CVA. She also has seizures,DVT,GERD and hypertension.  She is a resident from [facility], she was transferred due to acute on chronic encephalopathy and hypotension related to urinary tract infection. She does have a poor baseline,onlytracking with her eyes. On her initial physical examination her blood pressure was 90/57, pulse rate 102, temperature 38.3 C, respiratory rate 24 oxygen saturation 98%.She was unresponsive, trach in place, scattered rhonchi bilaterally, heart S1-S2 present regular, abdomen soft, no lower extremity edema.  Sodium 130, potassium 5.1, chloride 98, bicarb 13, glucose 125, BUN 57, creatinine 1.68, white count 45.6, hemoglobin 9.7, hematocrit 39.3, platelets 387.SARS COVID-19 negative. Urinalysis had specific gravity 1.020, few bacteria, moderate leukocytes. Stool for C. difficile was negative. Her chest radiograph had bibasilar atelectasis, CT of the chest and abdomen no acute changes.EKG 106 bpm, normal axis, normal intervals (corrected QTC 432),septal Q waves, no significant ST-T segment or T wave changes.  Patient was admitted with a working diagnosis of sepsis due to urinary tract infection.  Patient developed shock due to gram-negative bacteremia,Klebsiella Pneumonia,she required vasopressors and broad-spectrum antibiotic therapy.  Persistent fevers, due to tracheobronchitis-ventilator associated pneumonia (not present on admission) due to stenotrophomonas maltophilia and MDR acinetobacter.   Assessment & Plan:   Principal Problem:   Fever Active Problems:   Chronic  respiratory failure (HCC)   Sepsis (HCC)   Bacteremia due to Klebsiella pneumoniae   UTI (urinary tract infection)   Pressure injury of skin   1.Septic shock due to gram-negative bacteremia/Klebsiella, urinary tract infection, present on admission.Resolved.   2.Ventilator associated pneumonia/tracheobronchitis, not present on admission.Positive for stenotrophomonas and MDR Acinetobacter. IVeravacyclinetill July 22 per ID recommendations.   3.Chronic systolic heart failure. Onmidodrine.  4.Paroxysmal atrial fibrillation.Continue withmetoprolol and asa.  5.Ventilator dependent respiratory failure, hypercapnic and hypoxic, acute on chronic.Patient continue with no significant dyssynchrony on mandatory mechanical ventilation.   PC mode, with pressure of 27, peep 5 Fi02 30%, and respiratory rate of 20, generating Vt 450 ml. No significant dyssynchrony.  5.Hyponatremia/ hypernatremia.Electrolytes have been improving, continue free water flushes, discontinue IV fluids.  6.Seizures.Continue withvalproic acid,Keppra and phenytoin, per tube.   7.COPD.No acute clinical exacerbation.  8.Chronic anemia.has been stable, last Hgb at 7,3.    9.Chronic encephalopathy.Only grimacing to touch. Not opening her eyes.Continue pain control. Palliative care team consulted during this hospitalization.   10.Multiple pressure ulcers,right heel (unstageable), sacrum (stage III), vertebral column (stage III), left foot (unstageable)/ present on admission.Local wound care.Continue with fentanyl for pain control.   11.Type 2 diabetes mellitus.Insulin sliding scale, for glucose cover and monitoring.   12. Persistent diarrhea. On anti-motily agents.   DVT prophylaxis:enoxaparin Code Status:full Family Communication:no family at the bedside Disposition Plan/ discharge barriers:pending transfer to [facility]  Body mass index is 29.52  kg/m. Malnutrition Type:  Nutrition Problem: Increased nutrient needs Etiology: acute illness, chronic illness, wound healing   Malnutrition Characteristics:  Signs/Symptoms: estimated needs   Nutrition Interventions:  Interventions: Juven, Tube feeding  RN Pressure Injury Documentation: Pressure Injury 11/19/18 Heel Left Unstageable - Full thickness tissue loss in which the base of the ulcer is covered by slough (yellow, tan, gray, green or brown) and/or eschar (tan, brown or black) in the wound bed. (Active)  11/19/18 0800  Location: Heel  Location Orientation: Left  Staging: Unstageable - Full thickness tissue loss in which the base of the ulcer is covered by slough (yellow, tan, gray, green or brown) and/or eschar (tan, brown or black) in the wound bed.  Wound Description (Comments):   Present on Admission:      Pressure Injury 11/19/18 Heel Right Unstageable - Full thickness tissue loss in which the base of the ulcer is covered by slough (yellow, tan, gray, green or brown) and/or eschar (tan, brown or black) in the wound bed. (Active)  11/19/18 0800  Location: Heel  Location Orientation: Right  Staging: Unstageable - Full thickness tissue loss in which the base of the ulcer is covered by slough (yellow, tan, gray, green or brown) and/or eschar (tan, brown or black) in the wound bed.  Wound Description (Comments):   Present on Admission:      Pressure Injury 11/19/18 Sacrum Mid Stage III -  Full thickness tissue loss. Subcutaneous fat may be visible but bone, tendon or muscle are NOT exposed. (Active)  11/19/18 0800  Location: Sacrum  Location Orientation: Mid  Staging: Stage III -  Full thickness tissue loss. Subcutaneous fat may be visible but bone, tendon or muscle are NOT exposed.  Wound Description (Comments):   Present on Admission:      Pressure Injury 11/19/18 Vertebral column Mid;Medial Stage III -  Full thickness tissue loss. Subcutaneous fat may be visible but  bone, tendon or muscle are NOT exposed. (Active)  11/19/18 0800  Location: Vertebral column  Location Orientation: Mid;Medial  Staging: Stage III -  Full thickness tissue loss. Subcutaneous fat may be visible but bone, tendon or muscle are NOT exposed.  Wound Description (Comments):   Present on Admission:      Pressure Injury 11/19/18 Foot Left;Lateral Unstageable - Full thickness tissue loss in which the base of the ulcer is covered by slough (yellow, tan, gray, green or brown) and/or eschar (tan, brown or black) in the wound bed. (Active)  11/19/18 0800  Location: Foot  Location Orientation: Left;Lateral  Staging: Unstageable - Full thickness tissue loss in which the base of the ulcer is covered by slough (yellow, tan, gray, green or brown) and/or eschar (tan, brown or black) in the wound bed.  Wound Description (Comments):   Present on Admission:      Consultants:   ID  Procedures:     Antimicrobials:       Subjective: Patient neurological exam has not changed, continue to have diarrhea but has slowed down. Continue to tolerate tube feedings and mechanical ventilation.   Objective: Vitals:   11/27/18 1300 11/27/18 1400 11/27/18 1500 11/27/18 1507  BP: (!) 88/41 105/67 (!) 109/58   Pulse: 76 76 79   Resp: (!) 23 (!) 22 (!) 21   Temp:      TempSrc:      SpO2: 100% 100% 100% 100%  Weight:      Height:        Intake/Output Summary (Last 24 hours) at 11/27/2018 1621 Last data filed at 11/27/2018 1400 Gross per 24 hour  Intake 3761.93 ml  Output 1300 ml  Net 2461.93 ml   Filed Weights   11/25/18 0500 11/26/18 0100 11/27/18 0328  Weight: 84.9 kg 82.9 kg 85.5 kg    Examination:   General: deconditioned  Neurology: not responsive E ENT: trach in place.  Cardiovascular: No JVD. S1-S2 present, rhythmic, no gallops, rubs, or murmurs. ++/+++ pitting dependent extremity edema. Pulmonary:  positive breath sounds bilaterally, adequate air movement, no wheezing,  rhonchi or rales. Gastrointestinal. Abdomenwith no organomegaly, non tender, no rebound or guarding/ peg tube in place.  Skin. No rashes Musculoskeletal: no joint deformities     Data Reviewed: I have personally reviewed following labs and imaging studies  CBC: Recent Labs  Lab 11/22/18 0404 11/23/18 0333 11/24/18 0348  WBC 11.3* 13.2* 11.7*  NEUTROABS 8.7* 10.3* 8.3*  HGB 7.3* 7.5* 7.3*  HCT 26.8* 28.1* 26.9*  MCV 106.3* 108.1* 107.2*  PLT 298 300 893   Basic Metabolic Panel: Recent Labs  Lab 11/22/18 1722 11/23/18 0333 11/24/18 0348 11/25/18 0428 11/26/18 1229  NA 159* 154* 154* 153* 143  K 3.4* 3.4* 3.5 3.7 4.0  CL >130* 125* 127* 127* 118*  CO2 20* 19* 20* 20* 18*  GLUCOSE 196* 203* 112* 106* 260*  BUN 56* 56* 66* 64* 70*  CREATININE 0.61 0.52 0.51 0.52 0.65  CALCIUM 8.6* 8.4* 8.6* 8.5* 8.0*   GFR: Estimated Creatinine Clearance: 62 mL/min (by C-G formula based on SCr of 0.65 mg/dL). Liver Function Tests: Recent Labs  Lab 11/24/18 0348  AST 22  ALT 45*  ALKPHOS 106  BILITOT 0.2*  PROT 5.2*  ALBUMIN 1.6*   No results for input(s): LIPASE, AMYLASE in the last 168 hours. No results for input(s): AMMONIA in the last 168 hours. Coagulation Profile: No results for input(s): INR, PROTIME in the last 168 hours. Cardiac Enzymes: No results for input(s): CKTOTAL, CKMB, CKMBINDEX, TROPONINI in the last 168 hours. BNP (last 3 results) No results for input(s): PROBNP in the last 8760 hours. HbA1C: No results for input(s): HGBA1C in the last 72 hours. CBG: Recent Labs  Lab 11/27/18 0014 11/27/18 0432 11/27/18 0817 11/27/18 1104 11/27/18 1546  GLUCAP 116* 103* 109* 133* 85   Lipid Profile: No results for input(s): CHOL, HDL, LDLCALC, TRIG, CHOLHDL, LDLDIRECT in the last 72 hours. Thyroid Function Tests: No results for input(s): TSH, T4TOTAL, FREET4, T3FREE, THYROIDAB in the last 72 hours. Anemia Panel: No results for input(s): VITAMINB12, FOLATE,  FERRITIN, TIBC, IRON, RETICCTPCT in the last 72 hours.    Radiology Studies: I have reviewed all of the imaging during this hospital visit personally     Scheduled Meds: . acetaminophen (TYLENOL) oral liquid 160 mg/5 mL  1,000 mg Per Tube TID  . aspirin  81 mg Per Tube Daily  . chlorhexidine gluconate (MEDLINE KIT)  15 mL Mouth Rinse BID  . Chlorhexidine Gluconate Cloth  6 each Topical Q0600  . free water  300 mL Per Tube Q4H  . insulin aspart  0-9 Units Subcutaneous Q4H  . levETIRAcetam  1,000 mg Per Tube BID  . mouth rinse  15 mL Mouth Rinse 10 times per day  . metoprolol tartrate  12.5 mg Per Tube BID  . midodrine  5 mg Per Tube TID WC  . multivitamin  15 mL Per Tube Daily  . nutrition supplement (JUVEN)  1 packet Per Tube BID  . pantoprazole sodium  40 mg Per Tube Daily  . phenytoin  160 mg Per Tube TID  . phosphorus  500 mg Per Tube BID  . sodium chloride flush  10-40 mL Intracatheter Q12H  . valproic acid  250 mg Per Tube BID  . zinc sulfate  220 mg Per Tube Daily   Continuous Infusions: . sodium chloride 10 mL/hr at 11/27/18 1400  . dextrose 50 mL/hr at 11/27/18 1400  . eravacycline Stopped (11/27/18 1132)  . feeding supplement (  PIVOT 1.5 CAL) Stopped (11/27/18 1000)     LOS: 15 days        Albie Bazin Gerome Apley, MD

## 2018-11-27 NOTE — Progress Notes (Signed)
Left voicemail for Erline Levine at [facility] to inquire availability for female bed.   Manya Silvas, RN CCM Transitions of Care [phone]

## 2018-11-28 LAB — GLUCOSE, CAPILLARY
Glucose-Capillary: 97 mg/dL (ref 70–99)
Glucose-Capillary: 93 mg/dL (ref 70–99)
Glucose-Capillary: 149 mg/dL — ABNORMAL HIGH (ref 70–99)
Glucose-Capillary: 96 mg/dL (ref 70–99)
Glucose-Capillary: 108 mg/dL — ABNORMAL HIGH (ref 70–99)

## 2018-11-28 NOTE — Progress Notes (Signed)
RT note: patient chronic vent/trach.  No SBT at this time.  Will continue to monitor.

## 2018-11-28 NOTE — Progress Notes (Signed)
Spoke with Erline Levine at [facility]. No female beds available.   Manya Silvas, RN CCM Transtions of Care [phone]

## 2018-11-28 NOTE — Progress Notes (Signed)
Chart reviewed for LOS; B Matisyn Cabeza RN,MHA,BSN Advanced Care Supervisor [phone] 

## 2018-11-28 NOTE — Plan of Care (Signed)
  Problem: Nutrition: Goal: Adequate nutrition will be maintained 11/28/2018 1256 by Netta Corrigan, RN Outcome: Progressing 11/28/2018 1256 by Netta Corrigan, RN Outcome: Progressing   Problem: Elimination: Goal: Will not experience complications related to bowel motility Outcome: Progressing   Problem: Safety: Goal: Ability to remain free from injury will improve Outcome: Progressing   Problem: Skin Integrity: Goal: Risk for impaired skin integrity will decrease 11/28/2018 1256 by Netta Corrigan, RN Outcome: Not Progressing Note: Pt has constant liquid bowel movements. Due to no rectal tone, a flexiseal is not an option. Anatomy makes a rectal pouch difficult to place. Pt has MASD due to the constant moisture. MDs are aware of constant liquid stools. 11/28/2018 1256 by Netta Corrigan, RN Outcome: Not Met (add Reason)   Problem: Activity: Goal: Risk for activity intolerance will decrease Outcome: Not Applicable

## 2018-11-28 NOTE — Progress Notes (Signed)
PROGRESS NOTE    Melissa Cochran  FIE:[id number] DOB: Mar 15, 1936 DOA: 11/11/2018 PCP: [name], MD    Brief Narrative:  83 year old female who presented with sepsis. She does have significant past medical history for chronic hypoxic respiratory failure, ventilator dependent., coronaryareterydisease, heart failure, paroxysmal atrialfibrillation, COPD and CVA. She also has seizures,DVT,GERD and hypertension.  She is a resident from [facility], she was transferred due to acute on chronic encephalopathy and hypotension related to urinary tract infection. She does have a poor baseline,onlytracking with her eyes. On her initial physical examination her blood pressure was 90/57, pulse rate 102, temperature 38.3 C, respiratory rate 24 oxygen saturation 98%.She was unresponsive, trach in place, scattered rhonchi bilaterally, heart S1-S2 present regular, abdomen soft, no lower extremity edema.  Sodium 130, potassium 5.1, chloride 98, bicarb 13, glucose 125, BUN 57, creatinine 1.68, white count 45.6, hemoglobin 9.7, hematocrit 39.3, platelets 387.SARS COVID-19 negative. Urinalysis had specific gravity 1.020, few bacteria, moderate leukocytes. Stool for C. difficile was negative. Her chest radiograph had bibasilar atelectasis, CT of the chest and abdomen no acute changes.EKG 106 bpm, normal axis, normal intervals (corrected QTC 432),septal Q waves, no significant ST-T segment or T wave changes.  Patient was admitted with a working diagnosis of sepsis due to urinary tract infection.  Patient developed shock due to gram-negative bacteremia,Klebsiella Pneumonia,she required vasopressors and broad-spectrum antibiotic therapy.  Persistent fevers, due to tracheobronchitis-ventilator associated pneumonia (not present on admission) due to stenotrophomonas maltophilia and MDR acinetobacter.  Patient waiting to be transferred to [facility].   Assessment & Plan:   Principal  Problem:   Fever Active Problems:   Chronic respiratory failure (HCC)   Sepsis (HCC)   Bacteremia due to Klebsiella pneumoniae   UTI (urinary tract infection)   Pressure injury of skin    1.Septic shock due to gram-negative bacteremia/Klebsiella, urinary tract infection, present on admission.   2.Ventilator associated pneumonia/tracheobronchitis, not present on admission.Positive for stenotrophomonas and MDR Acinetobacter. Continue IVeravacyclinetillJuly 22per ID recommendations.  3.Chronic systolic heart failure. Continue withmidodrine.  4.Paroxysmal atrial fibrillation.Continue rate control  withmetoprolol andasa.  5.Ventilator dependent respiratory failure, hypercapnic and hypoxic, acute on chronic.On mandatory mechanical ventilation.  PC mode, with pressure of 27, peep 5 Fi02 30%, and respiratory rate of 20, generating Vt 450 ml. Continue to have no significant dyssynchrony.  5.Hyponatremia/ hypernatremia.Continue free water flushes.   6.Seizures. Onvalproic acid,Keppra and phenytoin,per tube.  7.COPD.Nosigns of acute clinical exacerbation.  8.Chronic anemia. Follow cell count as needed.   9.Chronic encephalopathy.Very poor prognosis onlygrimacing to touch.    10.Multiple pressure ulcers,right heel (unstageable), sacrum (stage III), vertebral column (stage III), left foot (unstageable)/ present on admission.Continue with local wound care.On fentanylfor pain control.  11.Type 2 diabetes mellitus.Continue with insulin sliding scale, capillary glucose 85, 120, 114, 97, 93.   12. Persistent diarrhea. Continue with anti-motily agents.  DVT prophylaxis:enoxaparin Code Status:full Family Communication:no family at the bedside Disposition Plan/ discharge barriers:pending transfer to [facility]   Body mass index is 29.73 kg/m. Malnutrition Type:  Nutrition Problem: Increased nutrient needs  Etiology: acute illness, chronic illness, wound healing   Malnutrition Characteristics:  Signs/Symptoms: estimated needs   Nutrition Interventions:  Interventions: Juven, Tube feeding  RN Pressure Injury Documentation: Pressure Injury 11/19/18 Heel Left Unstageable - Full thickness tissue loss in which the base of the ulcer is covered by slough (yellow, tan, gray, green or brown) and/or eschar (tan, brown or black) in the wound bed. (Active)  11/19/18 0800  Location: Heel  Location Orientation: Left  Staging: Unstageable - Full thickness tissue loss in which the base of the ulcer is covered by slough (yellow, tan, gray, green or brown) and/or eschar (tan, brown or black) in the wound bed.  Wound Description (Comments):   Present on Admission:      Pressure Injury 11/19/18 Heel Right Unstageable - Full thickness tissue loss in which the base of the ulcer is covered by slough (yellow, tan, gray, green or brown) and/or eschar (tan, brown or black) in the wound bed. (Active)  11/19/18 0800  Location: Heel  Location Orientation: Right  Staging: Unstageable - Full thickness tissue loss in which the base of the ulcer is covered by slough (yellow, tan, gray, green or brown) and/or eschar (tan, brown or black) in the wound bed.  Wound Description (Comments):   Present on Admission:      Pressure Injury 11/19/18 Sacrum Mid Stage III -  Full thickness tissue loss. Subcutaneous fat may be visible but bone, tendon or muscle are NOT exposed. (Active)  11/19/18 0800  Location: Sacrum  Location Orientation: Mid  Staging: Stage III -  Full thickness tissue loss. Subcutaneous fat may be visible but bone, tendon or muscle are NOT exposed.  Wound Description (Comments):   Present on Admission:      Pressure Injury 11/19/18 Vertebral column Mid;Medial Stage III -  Full thickness tissue loss. Subcutaneous fat may be visible but bone, tendon or muscle are NOT exposed. (Active)  11/19/18 0800   Location: Vertebral column  Location Orientation: Mid;Medial  Staging: Stage III -  Full thickness tissue loss. Subcutaneous fat may be visible but bone, tendon or muscle are NOT exposed.  Wound Description (Comments):   Present on Admission:      Pressure Injury 11/19/18 Foot Left;Lateral Unstageable - Full thickness tissue loss in which the base of the ulcer is covered by slough (yellow, tan, gray, green or brown) and/or eschar (tan, brown or black) in the wound bed. (Active)  11/19/18 0800  Location: Foot  Location Orientation: Left;Lateral  Staging: Unstageable - Full thickness tissue loss in which the base of the ulcer is covered by slough (yellow, tan, gray, green or brown) and/or eschar (tan, brown or black) in the wound bed.  Wound Description (Comments):   Present on Admission:      Consultants:   ID  Procedures:     Antimicrobials:       Subjective: Patient clinically has not change, continue to be not responsive, tolerating tube feedings and mechanical ventilation.   Objective: Vitals:   11/28/18 0500 11/28/18 0600 11/28/18 0700 11/28/18 0742  BP: (!) 96/53 (!) 88/52  94/64  Pulse: 81 79  90  Resp: (!) 22 (!) 23  (!) 32  Temp:   98.7 F (37.1 C)   TempSrc:   Oral   SpO2: 99% 100%  100%  Weight: 86.1 kg     Height:        Intake/Output Summary (Last 24 hours) at 11/28/2018 1035 Last data filed at 11/28/2018 0615 Gross per 24 hour  Intake 3328.65 ml  Output 750 ml  Net 2578.65 ml   Filed Weights   11/26/18 0100 11/27/18 0328 11/28/18 0500  Weight: 82.9 kg 85.5 kg 86.1 kg    Examination:   General: deconditioned.  Neurology: not responsive E ENT: trach in place.  Cardiovascular: No JVD. S1-S2 present, rhythmic, no gallops, rubs, or murmurs. No lower extremity edema. Pulmonary: vpositive breath sounds bilaterally, no wheezing, rhonchi or rales. Gastrointestinal. Abdomen with no  organomegaly, non tender, no rebound or guarding/ peg tube in place.   Skin. Sacral decubitus ulcer stage 2.  Musculoskeletal: no joint deformities     Data Reviewed: I have personally reviewed following labs and imaging studies  CBC: Recent Labs  Lab 11/22/18 0404 11/23/18 0333 11/24/18 0348  WBC 11.3* 13.2* 11.7*  NEUTROABS 8.7* 10.3* 8.3*  HGB 7.3* 7.5* 7.3*  HCT 26.8* 28.1* 26.9*  MCV 106.3* 108.1* 107.2*  PLT 298 300 557   Basic Metabolic Panel: Recent Labs  Lab 11/22/18 1722 11/23/18 0333 11/24/18 0348 11/25/18 0428 11/26/18 1229  NA 159* 154* 154* 153* 143  K 3.4* 3.4* 3.5 3.7 4.0  CL >130* 125* 127* 127* 118*  CO2 20* 19* 20* 20* 18*  GLUCOSE 196* 203* 112* 106* 260*  BUN 56* 56* 66* 64* 70*  CREATININE 0.61 0.52 0.51 0.52 0.65  CALCIUM 8.6* 8.4* 8.6* 8.5* 8.0*   GFR: Estimated Creatinine Clearance: 62.2 mL/min (by C-G formula based on SCr of 0.65 mg/dL). Liver Function Tests: Recent Labs  Lab 11/24/18 0348  AST 22  ALT 45*  ALKPHOS 106  BILITOT 0.2*  PROT 5.2*  ALBUMIN 1.6*   No results for input(s): LIPASE, AMYLASE in the last 168 hours. No results for input(s): AMMONIA in the last 168 hours. Coagulation Profile: No results for input(s): INR, PROTIME in the last 168 hours. Cardiac Enzymes: No results for input(s): CKTOTAL, CKMB, CKMBINDEX, TROPONINI in the last 168 hours. BNP (last 3 results) No results for input(s): PROBNP in the last 8760 hours. HbA1C: No results for input(s): HGBA1C in the last 72 hours. CBG: Recent Labs  Lab 11/27/18 1546 11/27/18 1952 11/27/18 2340 11/28/18 0334 11/28/18 0725  GLUCAP 85 120* 114* 97 93   Lipid Profile: No results for input(s): CHOL, HDL, LDLCALC, TRIG, CHOLHDL, LDLDIRECT in the last 72 hours. Thyroid Function Tests: No results for input(s): TSH, T4TOTAL, FREET4, T3FREE, THYROIDAB in the last 72 hours. Anemia Panel: No results for input(s): VITAMINB12, FOLATE, FERRITIN, TIBC, IRON, RETICCTPCT in the last 72 hours.    Radiology Studies: I have reviewed all of  the imaging during this hospital visit personally     Scheduled Meds: . acetaminophen (TYLENOL) oral liquid 160 mg/5 mL  1,000 mg Per Tube TID  . aspirin  81 mg Per Tube Daily  . chlorhexidine gluconate (MEDLINE KIT)  15 mL Mouth Rinse BID  . Chlorhexidine Gluconate Cloth  6 each Topical Q0600  . free water  300 mL Per Tube Q4H  . insulin aspart  0-9 Units Subcutaneous Q4H  . levETIRAcetam  1,000 mg Per Tube BID  . mouth rinse  15 mL Mouth Rinse 10 times per day  . metoprolol tartrate  12.5 mg Per Tube BID  . midodrine  5 mg Per Tube TID WC  . multivitamin  15 mL Per Tube Daily  . nutrition supplement (JUVEN)  1 packet Per Tube BID  . pantoprazole sodium  40 mg Per Tube Daily  . phenytoin  160 mg Per Tube TID  . phosphorus  500 mg Per Tube BID  . sodium chloride flush  10-40 mL Intracatheter Q12H  . valproic acid  250 mg Per Tube BID  . zinc sulfate  220 mg Per Tube Daily   Continuous Infusions: . sodium chloride 10 mL/hr at 11/28/18 0600  . dextrose 50 mL/hr at 11/28/18 0600  . eravacycline Stopped (11/27/18 2312)  . feeding supplement (PIVOT 1.5 CAL) Stopped (11/28/18 0942)     LOS:  16 days        Michai Dieppa Gerome Apley, MD

## 2018-11-29 LAB — CBC WITH DIFFERENTIAL/PLATELET
Abs Immature Granulocytes: 0.11 10*3/uL — ABNORMAL HIGH (ref 0.00–0.07)
Basophils Absolute: 0.1 10*3/uL (ref 0.0–0.1)
Basophils Relative: 1 %
Eosinophils Absolute: 0.3 10*3/uL (ref 0.0–0.5)
Eosinophils Relative: 3 %
HCT: 28.3 % — ABNORMAL LOW (ref 36.0–46.0)
Hemoglobin: 8.3 g/dL — ABNORMAL LOW (ref 12.0–15.0)
Immature Granulocytes: 1 %
Lymphocytes Relative: 23 %
Lymphs Abs: 2.3 10*3/uL (ref 0.7–4.0)
MCH: 30.2 pg (ref 26.0–34.0)
MCHC: 29.3 g/dL — ABNORMAL LOW (ref 30.0–36.0)
MCV: 102.9 fL — ABNORMAL HIGH (ref 80.0–100.0)
Monocytes Absolute: 0.6 10*3/uL (ref 0.1–1.0)
Monocytes Relative: 6 %
Neutro Abs: 6.5 10*3/uL (ref 1.7–7.7)
Neutrophils Relative %: 66 %
Platelets: 300 10*3/uL (ref 150–400)
RBC: 2.75 MIL/uL — ABNORMAL LOW (ref 3.87–5.11)
RDW: 21 % — ABNORMAL HIGH (ref 11.5–15.5)
WBC: 10 10*3/uL (ref 4.0–10.5)
nRBC: 0.2 % (ref 0.0–0.2)

## 2018-11-29 LAB — BASIC METABOLIC PANEL
Anion gap: 7 (ref 5–15)
BUN: 62 mg/dL — ABNORMAL HIGH (ref 8–23)
CO2: 20 mmol/L — ABNORMAL LOW (ref 22–32)
Calcium: 7.9 mg/dL — ABNORMAL LOW (ref 8.9–10.3)
Chloride: 114 mmol/L — ABNORMAL HIGH (ref 98–111)
Creatinine, Ser: 0.56 mg/dL (ref 0.44–1.00)
GFR calc Af Amer: >60 mL/min (ref >60)
GFR calc non Af Amer: >60 mL/min (ref >60)
Glucose, Bld: 244 mg/dL — ABNORMAL HIGH (ref 70–99)
Potassium: 4.3 mmol/L (ref 3.5–5.1)
Sodium: 141 mmol/L (ref 135–145)

## 2018-11-29 LAB — GLUCOSE, CAPILLARY
Glucose-Capillary: 104 mg/dL — ABNORMAL HIGH (ref 70–99)
Glucose-Capillary: 132 mg/dL — ABNORMAL HIGH (ref 70–99)
Glucose-Capillary: 80 mg/dL (ref 70–99)
Glucose-Capillary: 92 mg/dL (ref 70–99)

## 2018-11-29 MED ORDER — HYDROCODONE-ACETAMINOPHEN 5-325 MG PO TABS: 1.0000 | ORAL_TABLET | Freq: Four times a day (QID) | ORAL | 0 refills | Status: DC | PRN

## 2018-11-29 NOTE — TOC Transition Note (Signed)
Transition of Care [facility]) - CM/SW Discharge Note   Patient Details  Name: AMEILA WELDON MRN: [id number] Date of Birth: [birth year]  Transition of Care [facility]) CM/SW Contact:  Bartholomew Crews, RN Phone Number: [phone] 11/29/2018, 12:46 PM   Clinical Narrative:    Patient to transition back to [facility] today. DC summary, FL2, and covid results from 7/20 faxed to [facility]. Daughter [name]) notified of transition. [facility] notified for transport. ICU RN to call report to [facility] at [phone]. Patient is going to room 307. Dr. Manson Allan is the accepting physician. No other transition of care needs identified.    Final next level of care: Long Term Nursing Home Barriers to Discharge: No Barriers Identified   Patient Goals and CMS Choice Patient states their goals for this hospitalization and ongoing recovery are:: Return to [facility].gov Compare Post Acute Care list provided to:: Patient Represenative (must comment)(Rhonda Winona Legato) Choice offered to / list presented to : Adult Children  Discharge Placement              Patient chooses bed at: [place] Patient to be transferred to facility by: [facility] Name of family member notified: Aline Wesche - daughter Patient and family notified of of transfer: 11/29/18  Discharge Plan and Services In-house Referral: Clinical Social Work Discharge Planning Services: CM Consult Post Acute Care Choice: NA          DME Arranged: N/A DME Agency: NA       HH Arranged: NA HH Agency: NA        Social Determinants of Health (SDOH) Interventions     Readmission Risk Interventions No flowsheet data found.

## 2018-11-29 NOTE — Discharge Instructions (Signed)
Sepsis, Diagnosis, Adult Sepsis is a serious bodily reaction to an infection. The infection that triggers sepsis may be from a bacteria, virus, or fungus. Sepsis can result from an infection in any part of your body. Infections that commonly lead to sepsis include skin, lung, and urinary tract infections. Sepsis is a medical emergency that must be treated right away in a hospital. In severe cases, it can lead to septic shock. Septic shock can weaken your heart and cause your blood pressure to drop. This can cause your central nervous system and your body's organs to stop working. What are the causes? This condition is caused by a severe reaction to infections from bacteria, viruses, or fungus. The germs that most often lead to sepsis include:  Escherichia coli (E. coli) bacteria.  Staphylococcus aureus (staph) bacteria.  Some types of Streptococcus bacteria. The most common infections affect these organs:  The lung (pneumonia).  The kidneys or bladder (urinary tract infection).  The skin (cellulitis).  The bowel, gallbladder, or pancreas. What increases the risk? You are more likely to develop this condition if:  Your body's disease-fighting system (immune system) is weakened.  You are age 81 or older.  You are female.  You had surgery or you have been hospitalized.  You have these devices inserted into your body: ? A small, thin tube (catheter). ? IV line. ? Breathing tube. ? Drainage tube.  You are not getting enough nutrients from food (malnourished).  You have a long-term (chronic) disease, such as cancer, lung disease, kidney disease, or diabetes.  You are African American. What are the signs or symptoms? Symptoms of this condition may include:  Fever.  Chills or feeling very cold.  Confusion or anxiety.  Fatigue.  Muscle aches.  Shortness of breath.  Nausea and vomiting.  Urinating much less than usual.  Fast heart rate (tachycardia).  Rapid  breathing (hyperventilation).  Changes in skin color. Your skin may look blotchy, pale, or blue.  Cool, clammy, or sweaty skin.  Skin rash. Other symptoms depend on the source of your infection. How is this diagnosed? This condition is diagnosed based on:  Your symptoms.  Your medical history.  A physical exam. Other tests may also be done to find out the cause of the infection and how severe the sepsis is. These tests may include:  Blood tests.  Urine tests.  Swabs from other areas of your body that may have an infection. These samples may be tested (cultured) to find out what type of bacteria is causing the infection.  Chest X-ray to check for pneumonia. Other imaging tests, such as a CT scan, may also be done.  Lumbar puncture. This removes a small amount of the fluid that surrounds your brain and spinal cord. The fluid is then examined for infection. How is this treated? This condition must be treated in a hospital. Based on the cause of your infection, you may be given an antibiotic, antiviral, or antifungal medicine. You may also receive:  Fluids through an IV.  Oxygen and breathing assistance.  Medicines to increase your blood pressure.  Kidney dialysis. This process cleans your blood if your kidneys have failed.  Surgery to remove infected tissue.  Blood transfusion if needed.  Medicine to prevent blood clots.  Nutrients to correct imbalances in basic body function (metabolism). You may: ? Receive important salts and minerals (electrolytes) through an IV. ? Have your blood sugar level adjusted. Follow these instructions at home: Medicines   Take over-the-counter and  prescription medicines only as told by your health care provider.  If you were prescribed an antibiotic, antiviral, or antifungal medicine, take it as told by your health care provider. Do not stop taking the medicine even if you start to feel better. General instructions  If you have a  catheter or other indwelling device, ask to have it removed as soon as possible.  Keep all follow-up visits as told by your health care provider. This is important. Contact a health care provider if:  You do not feel like you are getting better or regaining strength.  You are having trouble coping with your recovery.  You frequently feel tired.  You feel worse or do not seem to get better after surgery.  You think you may have an infection after surgery. Get help right away if:  You have any symptoms of sepsis.  You have difficulty breathing.  You have a rapid or skipping heartbeat.  You become confused or disoriented.  You have a high fever.  Your skin becomes blotchy, pale, or blue.  You have an infection that is getting worse or not getting better. These symptoms may represent a serious problem that is an emergency. Do not wait to see if the symptoms will go away. Get medical help right away. Call your local emergency services (911 in the [place] Do not drive yourself to the hospital. Summary  Sepsis is a medical emergency that requires immediate treatment in a hospital.  This condition is caused by a severe reaction to infections from bacteria, viruses, or fungus.  Based on the cause of your infection, you may be given an antibiotic, antiviral, or antifungal medicine.  Treatment may also include IV fluids, breathing assistance, and kidney dialysis. This information is not intended to replace advice given to you by your health care provider. Make sure you discuss any questions you have with your health care provider. Document Released: 01/23/2003 Document Revised: 12/02/2017 Document Reviewed: 12/02/2017 Elsevier Patient Education  2020 [employer].

## 2018-11-29 NOTE — Discharge Summary (Signed)
Physician Discharge Summary  KAITLAN BIN GHW:[id number] DOB: 09-02-1935 DOA: 11/11/2018  PCP: [name], MD  Admit date: 11/11/2018 Discharge date: 11/29/2018  [facility]  Recommendations for Outpatient Follow-up:  1. Follow up with PCP in 1-2 weeks 2. Please obtain BMP/CBC in one week 3. Please follow up on the following pending results:  Home Health: None Equipment/Devices: None  Discharge Condition: Stable CODE STATUS: Full code Diet recommendation: Tube feedings  Subjective: Patient seen and examined.  She remains with tach and not much interaction.  Looks comfortable.  Brief/Interim Summary: 83 year old female who presented with sepsis. She does have significant past medical history for chronic hypoxic respiratory failure, ventilator dependent., coronaryareterydisease, heart failure, paroxysmal atrialfibrillation, COPD and CVA. She also has seizures,DVT,GERD and hypertension.  She is a resident from [facility], she was transferred due to acute on chronic encephalopathy and hypotension related to urinary tract infection. She does have a poor baseline,onlytracking with her eyes. On her initial physical examination her blood pressure was 90/57, pulse rate 102, temperature 38.3 C, respiratory rate 24 oxygen saturation 98%.She was unresponsive, trach in place, scattered rhonchi bilaterally, heart S1-S2 present regular, abdomen soft, no lower extremity edema.  Sodium 130, potassium 5.1, chloride 98, bicarb 13, glucose 125, BUN 57, creatinine 1.68, white count 45.6, hemoglobin 9.7, hematocrit 39.3, platelets 387.SARS COVID-19 negative. Urinalysis had specific gravity 1.020, few bacteria, moderate leukocytes. Stool for C. difficile was negative. Her chest radiograph had bibasilar atelectasis, CT of the chest and abdomen no acute changes.EKG 106 bpm, normal axis, normal intervals (corrected QTC 432),septal Q waves, no significant  ST-T segment or T wave changes.  Patient was admitted with a working diagnosis of sepsis due to urinary tract infection.  Patient developed shock due to gram-negative bacteremia,Klebsiella Pneumonia,she required vasopressors and broad-spectrum antibiotic therapy.  Patient initially had persistent fever due to tracheobronchitis-ventilator associated pneumonia (not present on admission) due to stenotrophomonas maltophilia and MDR acinetobacter however she has remained afebrile for last several days and she has completed course of ceftazidime-Avabactam per ID recommendations.  She is back to her baseline.  She was medically cleared 2 days ago however we were waiting for her bed availability which has been arranged today so she will be discharged in her baseline condition.  1.  Septic shock due to gram-negative bacteremia/Klebsiella, urinary tract infection, present on admission.  Patient was admitted to the intensive care unit, she received broad spectrum IV antibiotic therapy, vancomycin, meropenem, ceftazidime-avabactam. Blood cultures and urine culture grew Klebsiella pneumonia. Patient responded well to medical therapy, regained hemodynamic stability, vasopressors were discontinued.  2.  Ventilator associated pneumonia/tracheobronchitis, not present on admission.  Patient had persistent fevers, further work-up with respiratory culture was positive for stenotrophomonas and MDR Acinetobacter.  Infectious disease was consulted, patient was placed on eravacycline with good toleration.  Patient completed antibiotic course on 11/28/2018.  3.Chronic systolic heart failure. No signs of acute exacerbation.Continue with midodrine.  4.Paroxysmal atrial fibrillation. Her rates are controlled.  Due to hypotension, metoprolol is being discontinued.  She is on aspirin and not on any anticoagulation.  5.Ventilator dependent respiratory failure, hypercapnic and hypoxic, acute on chronic.Patient  was continued on mechanical ventilation with good toleration, no significant dyssynchrony.  5.Hyponatremia/ hypernatremia.Patient received IV fluids with improvement of electrolytes.  She required D5 water infusion and free water flushes.  Discharge sodium is 141.  6.Seizures.No active seizure activity, continue, valproic acidKeppra and phenytoin.  7.COPD.No signs of acute exacerbation.  8.Chronic anemia.Likely multifactorial, continue monitoring cell  count.   Discharge hemoglobin 8.3.  9.Chronic encephalopathy.Patient with very poor prognosis, at baseline only tracks with her eyes. Continue supportive care, palliative care services were consulted, family would like to continue aggressive care, continue full code.  10.Multiple pressure ulcers,right heel (unstageable), sacrum (stage III), vertebral column (stage III), left foot (unstageable)/ present on admission.Continue local wound care.  11.Type 2 diabetes mellitus.Continue insulin therapy, sliding scale, for glucose coverage and monitoring. Continue tube feeds.  Discharge Diagnoses:  Principal Problem:   Fever Active Problems:   Chronic respiratory failure (HCC)   Sepsis (HCC)   Bacteremia due to Klebsiella pneumoniae   UTI (urinary tract infection)   Pressure injury of skin    Discharge Instructions  Discharge Instructions    Diet - low sodium heart healthy   Complete by: As directed    Discharge instructions   Complete by: As directed    Follow up with [facility]   Discharge patient   Complete by: As directed    Discharge disposition: [place]   Discharge patient date: 11/29/2018   Home infusion instructions [facility] May follow [place] Dosing Protocol; May administer Cathflo as needed to maintain patency of vascular access device.; Flushing of vascular access device: per [facility] Protocol: 0.9% NaCl pre/post medica...   Complete by: As directed    Instructions: May  follow [place] Dosing Protocol   Instructions: May administer Cathflo as needed to maintain patency of vascular access device.   Instructions: Flushing of vascular access device: per [facility] Protocol: 0.9% NaCl pre/post medication administration and prn patency; Heparin 100 u/ml, 30ml for implanted ports and Heparin 10u/ml, 46ml for all other central venous catheters.   Instructions: May follow [facility] Anaphylaxis Protocol for First Dose Administration in the home: 0.9% NaCl at 25-50 ml/hr to maintain IV access for protocol meds. Epinephrine 0.3 ml IV/IM PRN and Benadryl 25-50 IV/IM PRN s/s of anaphylaxis.   Instructions: [place] Infusion Coordinator (RN) to assist per patient IV care needs in the home PRN.   Increase activity slowly   Complete by: As directed      Allergies as of 11/29/2018   No Known Allergies     Medication List    STOP taking these medications   ceFEPime  IVPB Commonly known as: MAXIPIME   furosemide 10 MG/ML injection Commonly known as: LASIX   vancomycin  IVPB     TAKE these medications   acetaminophen 325 MG tablet Commonly known as: TYLENOL Take 650 mg by mouth every 4 (four) hours as needed for mild pain.   albuterol (2.5 MG/3ML) 0.083% nebulizer solution Commonly known as: PROVENTIL Take 2.5 mg by nebulization every 6 (six) hours as needed for wheezing or shortness of breath.   aspirin 81 MG chewable tablet Place 1 tablet (81 mg total) into feeding tube daily.   chlorhexidine 0.12 % solution Commonly known as: PERIDEX 5 mLs See admin instructions. 5 ml's for trach care 2 times a day   CRITIC-AID EX Apply 1 application topically daily. Apply to buttocks   diphenoxylate-atropine 2.5-0.025 MG tablet Commonly known as: LOMOTIL Place 1 tablet into feeding tube 4 (four) times daily as needed for diarrhea or loose stools.   enoxaparin 40 MG/0.4ML injection Commonly known as: LOVENOX Inject 0.4 mLs (40 mg total) into the skin daily.    famotidine 20 MG tablet Commonly known as: PEPCID Place 20 mg into feeding tube daily.   feeding supplement (OSMOLITE 1.5 CAL) Liqd Place 1,000 mLs into  feeding tube continuous.   feeding supplement (PRO-STAT SUGAR FREE 64) Liqd Place 30 mLs into feeding tube 3 (three) times daily.   free water Soln Place 300 mLs into feeding tube every 4 (four) hours.   HYDROcodone-acetaminophen 5-325 MG tablet Commonly known as: NORCO/VICODIN Place 1 tablet into feeding tube every 6 (six) hours as needed for moderate pain.   levETIRAcetam 100 MG/ML solution Commonly known as: KEPPRA Place 10 mLs (1,000 mg total) into feeding tube 2 (two) times daily. What changed: when to take this   midodrine 5 MG tablet Commonly known as: PROAMATINE Take 1 tablet (5 mg total) by mouth 3 (three) times daily with meals. What changed: when to take this   multivitamin Liqd Place 15 mLs into feeding tube daily.   NORMAL SALINE FLUSH IV Inject 10 mLs into the vein See admin instructions. Use 10 ml's via IV every shift for any used lumens- flush each unused lumen   nystatin 100000 UNIT/ML suspension Commonly known as: MYCOSTATIN Take 5 mLs (500,000 Units total) by mouth 4 (four) times daily. What changed: when to take this   pantoprazole sodium 40 mg/20 mL Pack Commonly known as: PROTONIX Place 20 mLs (40 mg total) into feeding tube daily.   phenytoin 125 MG/5ML suspension Commonly known as: DILANTIN Place 500 mg into feeding tube every 12 (twelve) hours.   Valproate Sodium 250 MG/5ML Soln solution Commonly known as: DEPAKENE Place 5 mLs (250 mg total) into feeding tube 2 (two) times daily. What changed: when to take this            Home Infusion Instuctions  (From admission, onward)         Start     Ordered   11/24/18 0000  Home infusion instructions [facility] May follow [place] Dosing Protocol; May administer Cathflo as needed to maintain patency of vascular access  device.; Flushing of vascular access device: per [facility] Protocol: 0.9% NaCl pre/post medica...    Question Answer Comment  Instructions May follow [place] Dosing Protocol   Instructions May administer Cathflo as needed to maintain patency of vascular access device.   Instructions Flushing of vascular access device: per [facility] Protocol: 0.9% NaCl pre/post medication administration and prn patency; Heparin 100 u/ml, 74ml for implanted ports and Heparin 10u/ml, 72ml for all other central venous catheters.   Instructions May follow [facility] Anaphylaxis Protocol for First Dose Administration in the home: 0.9% NaCl at 25-50 ml/hr to maintain IV access for protocol meds. Epinephrine 0.3 ml IV/IM PRN and Benadryl 25-50 IV/IM PRN s/s of anaphylaxis.   Instructions [facility] Infusion Coordinator (RN) to assist per patient IV care needs in the home PRN.      11/24/18 1023         Follow-up Information    [name], MD Follow up in 1 week(s).   Specialty: Family Medicine Contact information: [address] [place] [address] [phone]          No Known Allergies  Consultations: PCCM/palliative care   Procedures/Studies: Ct Abdomen Pelvis Wo Contrast  Result Date: 11/12/2018 CLINICAL DATA:  Inpatient. Sepsis. History of CVA, CHF, COPD, bladder tumor. EXAM: CT CHEST, ABDOMEN AND PELVIS WITHOUT CONTRAST TECHNIQUE: Multidetector CT imaging of the chest, abdomen and pelvis was performed following the standard protocol without IV contrast. COMPARISON:  Abdominal radiograph from earlier today. Chest radiograph from one day prior. 09/27/2018 CT chest, abdomen and pelvis. FINDINGS: CT CHEST FINDINGS Cardiovascular: Normal heart  size. No significant pericardial effusion/thickening. Three-vessel coronary atherosclerosis. Left PICC terminates in the middle third of the SVC. Atherosclerotic nonaneurysmal thoracic aorta. Normal caliber pulmonary arteries. Mediastinum/Nodes: Stable  hypodense 1.9 cm left thyroid lobe nodule. Unremarkable esophagus. No axillary adenopathy. Stable mildly enlarged 1.0 cm right paratracheal (series 3/image 24) and 1.3 cm AP window (series 3/image 24) nodes. No discrete hilar adenopathy on this noncontrast scan. Lungs/Pleura: No pneumothorax. Trace dependent left pleural effusion with mild smooth left pleural thickening. No right pleural effusion. Tracheostomy tube tip is in the tracheal lumen 3.6 cm above the carina. Dense consolidation in basilar lower lobes bilaterally with associated air bronchograms and some volume loss, similar. Extensive patchy tree-in-bud opacities and scattered centrilobular nodularity throughout both lungs with associated scattered mild cylindrical and varicoid bronchiectasis throughout both lungs, not substantially changed in the interval. Representative 6 mm anterior right middle lobe nodule (series 4/image 87) is stable. No discrete lung masses. Musculoskeletal: No aggressive appearing focal osseous lesions. Mild thoracic spondylosis. CT ABDOMEN PELVIS FINDINGS Hepatobiliary: Liver surface is finely irregular, cannot exclude hepatic cirrhosis. No liver mass. Normal gallbladder with no radiopaque cholelithiasis. No biliary ductal dilatation. Pancreas: Normal, with no mass or duct dilation. Spleen: Normal size. No mass. Adrenals/Urinary Tract: Normal adrenals. Nonobstructing bilateral nephrolithiasis with largest stone measuring 10 mm in the lower left kidney and 2 mm in interpolar right kidney. No contour deforming renal masses. No overt hydronephrosis. No ureteral stones. Bladder decompressed by indwelling Foley catheter. Expected gas in the nondependent bladder lumen from instrumentation. No definite bladder wall thickening. Stomach/Bowel: Percutaneous gastrostomy tube is in place within the body of the stomach. Postsurgical changes from gastrojejunostomy. Stomach is decompressed and otherwise appears normal. Normal caliber small bowel  with no small bowel wall thickening. Appendix not discretely visualized. Mild sigmoid diverticulosis, with no definite large bowel wall thickening or significant pericolonic fat stranding. Scattered fluid levels in the large bowel. Rectal drainage tube in place. Vascular/Lymphatic: Atherosclerotic abdominal aorta with stable ectatic 2.6 cm infrarenal abdominal aorta. No pathologically enlarged lymph nodes in the abdomen or pelvis. Reproductive: Status post hysterectomy, with no abnormal findings at the vaginal cuff. Stable 3.7 cm right adnexal cyst (series 3/image 103). Stable multilocular 9.0 x 6.5 cm left adnexal cystic mass (series 3/image 111). Other: No pneumoperitoneum, ascites or focal fluid collection. Scattered trace ill-defined free fluid in the bilateral retroperitoneal posterior paranephric spaces. Musculoskeletal: No aggressive appearing focal osseous lesions. Left hip arthroplasty. Marked lumbar spondylosis. IMPRESSION: 1. Extensive patchy tree-in-bud opacities and scattered centrilobular nodularity throughout both lungs with associated scattered mild bronchiectasis. No appreciable interval change since 09/27/2018 CT. Differential includes recurrent aspiration and/or atypical mycobacterial infection (MAI). 2. Dense consolidation with air bronchograms at the lung bases bilaterally, unchanged, favor atelectasis and/or postinfectious/postinflammatory scarring. 3. Well-positioned tracheostomy tube, left PICC and percutaneous gastrostomy and rectal tubes. 4. Mild mediastinal lymphadenopathy is stable and probably reactive. 5. Trace dependent left pleural effusion. 6. No evidence of bowel obstruction. Fluid levels scattered in the large bowel, indicative of a nonspecific diarrheal/malabsorptive state. Mild sigmoid diverticulosis, with no evidence of acute diverticulitis. 7. Mildly irregular liver surface, cannot exclude cirrhosis. 8. Stable multilocular 9.0 cm left adnexal cystic mass, cannot exclude left  ovarian neoplasm. 9. Nonobstructive bilateral nephrolithiasis. 10.  Aortic Atherosclerosis (ICD10-I70.0). Electronically Signed   By: Ilona Sorrel M.D.   On: 11/12/2018 12:14   Dg Abdomen 1 View  Result Date: 11/12/2018 CLINICAL DATA:  84 y/o  F; evaluate for obstruction. EXAM: ABDOMEN - 1 VIEW COMPARISON:  09/27/2018 CT abdomen and pelvis FINDINGS: Peg tube tip projects over the left upper quadrant. There are left upper quadrant surgical sutures and staples. Moderate rotatory levocurvature of the lumbar spine. Abdominal aortic calcific atherosclerosis. Bowel gas pattern is normal. Left hip prosthesis, partially visualized. IMPRESSION: Normal bowel gas pattern. Electronically Signed   By: Kristine Garbe M.D.   On: 11/12/2018 06:13   Ct Chest Wo Contrast  Result Date: 11/12/2018 CLINICAL DATA:  Inpatient. Sepsis. History of CVA, CHF, COPD, bladder tumor. EXAM: CT CHEST, ABDOMEN AND PELVIS WITHOUT CONTRAST TECHNIQUE: Multidetector CT imaging of the chest, abdomen and pelvis was performed following the standard protocol without IV contrast. COMPARISON:  Abdominal radiograph from earlier today. Chest radiograph from one day prior. 09/27/2018 CT chest, abdomen and pelvis. FINDINGS: CT CHEST FINDINGS Cardiovascular: Normal heart size. No significant pericardial effusion/thickening. Three-vessel coronary atherosclerosis. Left PICC terminates in the middle third of the SVC. Atherosclerotic nonaneurysmal thoracic aorta. Normal caliber pulmonary arteries. Mediastinum/Nodes: Stable hypodense 1.9 cm left thyroid lobe nodule. Unremarkable esophagus. No axillary adenopathy. Stable mildly enlarged 1.0 cm right paratracheal (series 3/image 24) and 1.3 cm AP window (series 3/image 24) nodes. No discrete hilar adenopathy on this noncontrast scan. Lungs/Pleura: No pneumothorax. Trace dependent left pleural effusion with mild smooth left pleural thickening. No right pleural effusion. Tracheostomy tube tip is in the  tracheal lumen 3.6 cm above the carina. Dense consolidation in basilar lower lobes bilaterally with associated air bronchograms and some volume loss, similar. Extensive patchy tree-in-bud opacities and scattered centrilobular nodularity throughout both lungs with associated scattered mild cylindrical and varicoid bronchiectasis throughout both lungs, not substantially changed in the interval. Representative 6 mm anterior right middle lobe nodule (series 4/image 87) is stable. No discrete lung masses. Musculoskeletal: No aggressive appearing focal osseous lesions. Mild thoracic spondylosis. CT ABDOMEN PELVIS FINDINGS Hepatobiliary: Liver surface is finely irregular, cannot exclude hepatic cirrhosis. No liver mass. Normal gallbladder with no radiopaque cholelithiasis. No biliary ductal dilatation. Pancreas: Normal, with no mass or duct dilation. Spleen: Normal size. No mass. Adrenals/Urinary Tract: Normal adrenals. Nonobstructing bilateral nephrolithiasis with largest stone measuring 10 mm in the lower left kidney and 2 mm in interpolar right kidney. No contour deforming renal masses. No overt hydronephrosis. No ureteral stones. Bladder decompressed by indwelling Foley catheter. Expected gas in the nondependent bladder lumen from instrumentation. No definite bladder wall thickening. Stomach/Bowel: Percutaneous gastrostomy tube is in place within the body of the stomach. Postsurgical changes from gastrojejunostomy. Stomach is decompressed and otherwise appears normal. Normal caliber small bowel with no small bowel wall thickening. Appendix not discretely visualized. Mild sigmoid diverticulosis, with no definite large bowel wall thickening or significant pericolonic fat stranding. Scattered fluid levels in the large bowel. Rectal drainage tube in place. Vascular/Lymphatic: Atherosclerotic abdominal aorta with stable ectatic 2.6 cm infrarenal abdominal aorta. No pathologically enlarged lymph nodes in the abdomen or  pelvis. Reproductive: Status post hysterectomy, with no abnormal findings at the vaginal cuff. Stable 3.7 cm right adnexal cyst (series 3/image 103). Stable multilocular 9.0 x 6.5 cm left adnexal cystic mass (series 3/image 111). Other: No pneumoperitoneum, ascites or focal fluid collection. Scattered trace ill-defined free fluid in the bilateral retroperitoneal posterior paranephric spaces. Musculoskeletal: No aggressive appearing focal osseous lesions. Left hip arthroplasty. Marked lumbar spondylosis. IMPRESSION: 1. Extensive patchy tree-in-bud opacities and scattered centrilobular nodularity throughout both lungs with associated scattered mild bronchiectasis. No appreciable interval change since 09/27/2018 CT. Differential includes recurrent aspiration and/or atypical mycobacterial infection (MAI). 2. Dense consolidation with air bronchograms  at the lung bases bilaterally, unchanged, favor atelectasis and/or postinfectious/postinflammatory scarring. 3. Well-positioned tracheostomy tube, left PICC and percutaneous gastrostomy and rectal tubes. 4. Mild mediastinal lymphadenopathy is stable and probably reactive. 5. Trace dependent left pleural effusion. 6. No evidence of bowel obstruction. Fluid levels scattered in the large bowel, indicative of a nonspecific diarrheal/malabsorptive state. Mild sigmoid diverticulosis, with no evidence of acute diverticulitis. 7. Mildly irregular liver surface, cannot exclude cirrhosis. 8. Stable multilocular 9.0 cm left adnexal cystic mass, cannot exclude left ovarian neoplasm. 9. Nonobstructive bilateral nephrolithiasis. 10.  Aortic Atherosclerosis (ICD10-I70.0). Electronically Signed   By: Ilona Sorrel M.D.   On: 11/12/2018 12:14   Dg Chest Port 1 View  Result Date: 11/19/2018 CLINICAL DATA:  Fever and chills. EXAM: PORTABLE CHEST 1 VIEW COMPARISON:  November 11, 2018 FINDINGS: Tracheostomy tube in stable position. Cardiomediastinal silhouette is normal. Mediastinal contours  appear intact. Calcific atherosclerotic disease of the aorta. Mild peribronchial airspace opacities in bilateral lower lobes, left greater than right. Osseous structures are without acute abnormality. Soft tissues are grossly normal. IMPRESSION: Mild peribronchial airspace opacities in bilateral lower lobes, left greater than right. Nonspecific findings which may represent atelectasis or multifocal airspace disease. Electronically Signed   By: Fidela Salisbury M.D.   On: 11/19/2018 15:22   Dg Chest Port 1 View  Result Date: 11/11/2018 CLINICAL DATA:  Fever. EXAM: PORTABLE CHEST 1 VIEW COMPARISON:  Most recent radiograph 10/02/2018. Most recent CT 09/27/2018 FINDINGS: Tracheostomy tube tip 4 cm from the carina. Left upper extremity PICC in the mid SVC. Improvement in bilateral multifocal airspace disease with mild residual streaky opacities at the bases. Unchanged heart size and mediastinal contours. Aortic atherosclerosis. Mitral annulus calcifications. Prior right chest tube is been removed, no visualized pneumothorax. No large pleural effusion. IMPRESSION: 1. Improvement in bilateral multifocal airspace disease over the past 5 weeks with mild residual streaky opacities at the bases, likely atelectasis or scarring. 2. Tracheostomy tube and left upper extremity PICC remain in place. Electronically Signed   By: Keith Rake M.D.   On: 11/11/2018 23:25   [place] Ekg Site Rite  Result Date: 11/24/2018 If Site Rite image not attached, placement could not be confirmed due to current cardiac rhythm.     Discharge Exam: Vitals:   11/29/18 1107 11/29/18 1129  BP:    Pulse:    Resp:    Temp:  98.3 F (36.8 C)  SpO2: 100%    Vitals:   11/29/18 0900 11/29/18 1000 11/29/18 1107 11/29/18 1129  BP: (!) 109/58 103/66    Pulse: 86 93    Resp: (!) 31 (!) 47    Temp:    98.3 F (36.8 C)  TempSrc:    Oral  SpO2: 100% 100% 100%   Weight:      Height:        General: Patient is with tracheostomy and  PEG tube placement.  She just gazes and tracks at her baseline and she is at her baseline. Cardiovascular: Irregularly irregular rate and rhythm, S1/S2 +, no rubs, no gallops Respiratory: CTA bilaterally, no wheezing, no rhonchi Abdominal: Soft, NT, ND, bowel sounds + Extremities: +2 edema bilateral lower extremity and +1 edema bilateral upper extremity, no cyanosis    The results of significant diagnostics from this hospitalization (including imaging, microbiology, ancillary and laboratory) are listed below for reference.     Microbiology: Recent Results (from the past 240 hour(s))  Culture, blood (routine x 2)     Status: None  Collection Time: 11/19/18  2:16 PM   Specimen: BLOOD RIGHT HAND  Result Value Ref Range Status   Specimen Description BLOOD RIGHT HAND  Final   Special Requests   Final    BOTTLES DRAWN AEROBIC ONLY Blood Culture adequate volume   Culture   Final    NO GROWTH 5 DAYS Performed at [facility], [address] [place], [place] [address]    Report Status 11/24/2018 FINAL  Final  Culture, blood (routine x 2)     Status: None   Collection Time: 11/19/18  2:27 PM   Specimen: BLOOD RIGHT HAND  Result Value Ref Range Status   Specimen Description BLOOD RIGHT HAND  Final   Special Requests   Final    BOTTLES DRAWN AEROBIC ONLY Blood Culture adequate volume   Culture   Final    NO GROWTH 5 DAYS Performed at [facility], [place] [address] [place], [place] [address]    Report Status 11/24/2018 FINAL  Final  C difficile quick scan w PCR reflex     Status: None   Collection Time: 11/20/18  2:30 PM   Specimen: STOOL  Result Value Ref Range Status   C Diff antigen NEGATIVE NEGATIVE Final   C Diff toxin NEGATIVE NEGATIVE Final   C Diff interpretation No C. difficile detected.  Final    Comment: Performed at [facility], [place] [address], [place], [place] [address]  Culture, respiratory (non-expectorated)     Status: None   Collection Time:  11/21/18  8:46 AM   Specimen: Tracheal Aspirate; Respiratory  Result Value Ref Range Status   Specimen Description TRACHEAL ASPIRATE  Final   Special Requests Normal  Final   Gram Stain   Final    MODERATE WBC PRESENT, PREDOMINANTLY PMN MODERATE GRAM POSITIVE RODS    Culture   Final    FEW STENOTROPHOMONAS MALTOPHILIA ABUNDANT DIPHTHEROIDS(CORYNEBACTERIUM SPECIES) ORGANISM 2 Standardized susceptibility testing for this organism is not available. Performed at [facility], [place] [address] [place], [place] [address]    Report Status 11/24/2018 FINAL  Final   Organism ID, Bacteria STENOTROPHOMONAS MALTOPHILIA  Final      Susceptibility   Stenotrophomonas maltophilia - MIC*    LEVOFLOXACIN 4 INTERMEDIATE Intermediate     TRIMETH/SULFA <=20 SENSITIVE Sensitive     * FEW STENOTROPHOMONAS MALTOPHILIA  SARS Coronavirus 2 (CEPHEID - Performed in [place] hospital lab), Hosp Order     Status: None   Collection Time: 11/27/18  9:05 AM   Specimen: Nasopharyngeal Swab  Result Value Ref Range Status   SARS Coronavirus 2 NEGATIVE NEGATIVE Final    Comment: (NOTE) If result is NEGATIVE SARS-CoV-2 target nucleic acids are NOT DETECTED. The SARS-CoV-2 RNA is generally detectable in upper and lower  respiratory specimens during the acute phase of infection. The lowest  concentration of SARS-CoV-2 viral copies this assay can detect is 250  copies / mL. A negative result does not preclude SARS-CoV-2 infection  and should not be used as the sole basis for treatment or other  patient management decisions.  A negative result may occur with  improper specimen collection / handling, submission of specimen other  than nasopharyngeal swab, presence of viral mutation(s) within the  areas targeted by this assay, and inadequate number of viral copies  (<250 copies / mL). A negative result must be combined with clinical  observations, patient history, and epidemiological information. If result is  POSITIVE SARS-CoV-2 target  nucleic acids are DETECTED. The SARS-CoV-2 RNA is generally detectable in upper and lower  respiratory specimens dur ing the acute phase of infection.  Positive  results are indicative of active infection with SARS-CoV-2.  Clinical  correlation with patient history and other diagnostic information is  necessary to determine patient infection status.  Positive results do  not rule out bacterial infection or co-infection with other viruses. If result is PRESUMPTIVE POSTIVE SARS-CoV-2 nucleic acids MAY BE PRESENT.   A presumptive positive result was obtained on the submitted specimen  and confirmed on repeat testing.  While 2019 novel coronavirus  (SARS-CoV-2) nucleic acids may be present in the submitted sample  additional confirmatory testing may be necessary for epidemiological  and / or clinical management purposes  to differentiate between  SARS-CoV-2 and other Sarbecovirus currently known to infect humans.  If clinically indicated additional testing with an alternate test  methodology [phone]) is advised. The SARS-CoV-2 RNA is generally  detectable in upper and lower respiratory sp ecimens during the acute  phase of infection. The expected result is Negative. Fact Sheet for Patients:  StrictlyIdeas.no Fact Sheet for Healthcare Providers: BankingDealers.co.za This test is not yet approved or cleared by the [place] FDA and has been authorized for detection and/or diagnosis of SARS-CoV-2 by FDA under an Emergency Use Authorization (EUA).  This EUA will remain in effect (meaning this test can be used) for the duration of the COVID-19 declaration under Section 564(b)(1) of the Act, 21 U.S.C. section 360bbb-3(b)(1), unless the authorization is terminated or revoked sooner. Performed at [facility], [place] [address] [place], [place] [address]      Labs: BNP (last 3 results) No results for  input(s): BNP in the last 8760 hours. Basic Metabolic Panel: Recent Labs  Lab 11/23/18 0333 11/24/18 0348 11/25/18 0428 11/26/18 1229 11/29/18 0614  NA 154* 154* 153* 143 141  K 3.4* 3.5 3.7 4.0 4.3  CL 125* 127* 127* 118* 114*  CO2 19* 20* 20* 18* 20*  GLUCOSE 203* 112* 106* 260* 244*  BUN 56* 66* 64* 70* 62*  CREATININE 0.52 0.51 0.52 0.65 0.56  CALCIUM 8.4* 8.6* 8.5* 8.0* 7.9*   Liver Function Tests: Recent Labs  Lab 11/24/18 0348  AST 22  ALT 45*  ALKPHOS 106  BILITOT 0.2*  PROT 5.2*  ALBUMIN 1.6*   No results for input(s): LIPASE, AMYLASE in the last 168 hours. No results for input(s): AMMONIA in the last 168 hours. CBC: Recent Labs  Lab 11/23/18 0333 11/24/18 0348 11/29/18 0614  WBC 13.2* 11.7* 10.0  NEUTROABS 10.3* 8.3* 6.5  HGB 7.5* 7.3* 8.3*  HCT 28.1* 26.9* 28.3*  MCV 108.1* 107.2* 102.9*  PLT 300 302 300   Cardiac Enzymes: No results for input(s): CKTOTAL, CKMB, CKMBINDEX, TROPONINI in the last 168 hours. BNP: Invalid input(s): POCBNP CBG: Recent Labs  Lab 11/28/18 2015 11/28/18 2336 11/29/18 0344 11/29/18 0746 11/29/18 1133  GLUCAP 108* 104* 80 92 132*   D-Dimer No results for input(s): DDIMER in the last 72 hours. Hgb A1c No results for input(s): HGBA1C in the last 72 hours. Lipid Profile No results for input(s): CHOL, HDL, LDLCALC, TRIG, CHOLHDL, LDLDIRECT in the last 72 hours. Thyroid function studies No results for input(s): TSH, T4TOTAL, T3FREE, THYROIDAB in the last 72 hours.  Invalid input(s): FREET3 Anemia work up No results for input(s): VITAMINB12, FOLATE, FERRITIN, TIBC, IRON, RETICCTPCT in the last 72 hours. Urinalysis    Component Value Date/Time   COLORURINE YELLOW  11/11/2018 2220   APPEARANCEUR TURBID (A) 11/11/2018 2220   APPEARANCEUR Cloudy (A) 01/29/2016 1126   LABSPEC 1.020 11/11/2018 2220   LABSPEC 1.020 01/23/2014 1047   PHURINE 7.5 11/11/2018 2220   GLUCOSEU NEGATIVE 11/11/2018 2220   GLUCOSEU NEGATIVE  01/23/2014 1047   HGBUR LARGE (A) 11/11/2018 2220   BILIRUBINUR SMALL (A) 11/11/2018 2220   BILIRUBINUR Negative 01/13/2016 1110   BILIRUBINUR NEGATIVE 01/23/2014 1047   KETONESUR NEGATIVE 11/11/2018 2220   PROTEINUR >300 (A) 11/11/2018 2220   UROBILINOGEN 0.2 08/04/2015 1140   NITRITE NEGATIVE 11/11/2018 2220   LEUKOCYTESUR MODERATE (A) 11/11/2018 2220   LEUKOCYTESUR TRACE 01/23/2014 1047   Sepsis Labs Invalid input(s): PROCALCITONIN,  WBC,  LACTICIDVEN Microbiology Recent Results (from the past 240 hour(s))  Culture, blood (routine x 2)     Status: None   Collection Time: 11/19/18  2:16 PM   Specimen: BLOOD RIGHT HAND  Result Value Ref Range Status   Specimen Description BLOOD RIGHT HAND  Final   Special Requests   Final    BOTTLES DRAWN AEROBIC ONLY Blood Culture adequate volume   Culture   Final    NO GROWTH 5 DAYS Performed at [facility], [place] [address], [place], [place] [address]    Report Status 11/24/2018 FINAL  Final  Culture, blood (routine x 2)     Status: None   Collection Time: 11/19/18  2:27 PM   Specimen: BLOOD RIGHT HAND  Result Value Ref Range Status   Specimen Description BLOOD RIGHT HAND  Final   Special Requests   Final    BOTTLES DRAWN AEROBIC ONLY Blood Culture adequate volume   Culture   Final    NO GROWTH 5 DAYS Performed at [facility], [place] [address] [place], [place] [address]    Report Status 11/24/2018 FINAL  Final  C difficile quick scan w PCR reflex     Status: None   Collection Time: 11/20/18  2:30 PM   Specimen: STOOL  Result Value Ref Range Status   C Diff antigen NEGATIVE NEGATIVE Final   C Diff toxin NEGATIVE NEGATIVE Final   C Diff interpretation No C. difficile detected.  Final    Comment: Performed at [facility], [place] [address] [place], [place] [address]  Culture, respiratory (non-expectorated)     Status: None   Collection Time: 11/21/18  8:46 AM   Specimen: Tracheal Aspirate; Respiratory  Result Value  Ref Range Status   Specimen Description TRACHEAL ASPIRATE  Final   Special Requests Normal  Final   Gram Stain   Final    MODERATE WBC PRESENT, PREDOMINANTLY PMN MODERATE GRAM POSITIVE RODS    Culture   Final    FEW STENOTROPHOMONAS MALTOPHILIA ABUNDANT DIPHTHEROIDS(CORYNEBACTERIUM SPECIES) ORGANISM 2 Standardized susceptibility testing for this organism is not available. Performed at [facility], [place] [address] [place], [place] [address]    Report Status 11/24/2018 FINAL  Final   Organism ID, Bacteria STENOTROPHOMONAS MALTOPHILIA  Final      Susceptibility   Stenotrophomonas maltophilia - MIC*    LEVOFLOXACIN 4 INTERMEDIATE Intermediate     TRIMETH/SULFA <=20 SENSITIVE Sensitive     * FEW STENOTROPHOMONAS MALTOPHILIA  SARS Coronavirus 2 (CEPHEID - Performed in [place] hospital lab), Hosp Order     Status: None   Collection Time: 11/27/18  9:05 AM   Specimen: Nasopharyngeal Swab  Result Value Ref Range Status   SARS Coronavirus 2 NEGATIVE NEGATIVE Final  Comment: (NOTE) If result is NEGATIVE SARS-CoV-2 target nucleic acids are NOT DETECTED. The SARS-CoV-2 RNA is generally detectable in upper and lower  respiratory specimens during the acute phase of infection. The lowest  concentration of SARS-CoV-2 viral copies this assay can detect is 250  copies / mL. A negative result does not preclude SARS-CoV-2 infection  and should not be used as the sole basis for treatment or other  patient management decisions.  A negative result may occur with  improper specimen collection / handling, submission of specimen other  than nasopharyngeal swab, presence of viral mutation(s) within the  areas targeted by this assay, and inadequate number of viral copies  (<250 copies / mL). A negative result must be combined with clinical  observations, patient history, and epidemiological information. If result is POSITIVE SARS-CoV-2 target nucleic acids are DETECTED. The SARS-CoV-2 RNA is  generally detectable in upper and lower  respiratory specimens dur ing the acute phase of infection.  Positive  results are indicative of active infection with SARS-CoV-2.  Clinical  correlation with patient history and other diagnostic information is  necessary to determine patient infection status.  Positive results do  not rule out bacterial infection or co-infection with other viruses. If result is PRESUMPTIVE POSTIVE SARS-CoV-2 nucleic acids MAY BE PRESENT.   A presumptive positive result was obtained on the submitted specimen  and confirmed on repeat testing.  While 2019 novel coronavirus  (SARS-CoV-2) nucleic acids may be present in the submitted sample  additional confirmatory testing may be necessary for epidemiological  and / or clinical management purposes  to differentiate between  SARS-CoV-2 and other Sarbecovirus currently known to infect humans.  If clinically indicated additional testing with an alternate test  methodology [phone]) is advised. The SARS-CoV-2 RNA is generally  detectable in upper and lower respiratory sp ecimens during the acute  phase of infection. The expected result is Negative. Fact Sheet for Patients:  StrictlyIdeas.no Fact Sheet for Healthcare Providers: BankingDealers.co.za This test is not yet approved or cleared by the [place] FDA and has been authorized for detection and/or diagnosis of SARS-CoV-2 by FDA under an Emergency Use Authorization (EUA).  This EUA will remain in effect (meaning this test can be used) for the duration of the COVID-19 declaration under Section 564(b)(1) of the Act, 21 U.S.C. section 360bbb-3(b)(1), unless the authorization is terminated or revoked sooner. Performed at [facility], [place] [address], [place], [place] [address]      Time coordinating discharge: Over 30 minutes  SIGNED:   Darliss Cheney, MD  [facility] 11/29/2018, 12:02 PM Pager  [phone]  If 7PM-7AM, please contact night-coverage [email or web address] Password [email or web address]

## 2018-11-30 DIAGNOSIS — J158 Pneumonia due to other specified bacteria: Secondary | ICD-10-CM | POA: Diagnosis not present

## 2018-11-30 DIAGNOSIS — R6521 Severe sepsis with septic shock: Secondary | ICD-10-CM | POA: Diagnosis not present

## 2018-11-30 DIAGNOSIS — R7881 Bacteremia: Secondary | ICD-10-CM | POA: Diagnosis not present

## 2018-11-30 DIAGNOSIS — E119 Type 2 diabetes mellitus without complications: Secondary | ICD-10-CM | POA: Diagnosis not present

## 2018-11-30 DIAGNOSIS — Z1159 Encounter for screening for other viral diseases: Secondary | ICD-10-CM | POA: Diagnosis not present

## 2018-12-02 DIAGNOSIS — Z1159 Encounter for screening for other viral diseases: Secondary | ICD-10-CM | POA: Diagnosis not present

## 2018-12-02 DIAGNOSIS — E119 Type 2 diabetes mellitus without complications: Secondary | ICD-10-CM | POA: Diagnosis not present

## 2018-12-02 DIAGNOSIS — R6521 Severe sepsis with septic shock: Secondary | ICD-10-CM | POA: Diagnosis not present

## 2018-12-02 DIAGNOSIS — R7881 Bacteremia: Secondary | ICD-10-CM | POA: Diagnosis not present

## 2018-12-02 DIAGNOSIS — J158 Pneumonia due to other specified bacteria: Secondary | ICD-10-CM | POA: Diagnosis not present

## 2018-12-04 ENCOUNTER — Encounter (HOSPITAL_COMMUNITY): Payer: Self-pay | Admitting: Emergency Medicine

## 2018-12-04 ENCOUNTER — Other Ambulatory Visit: Payer: Self-pay

## 2018-12-04 ENCOUNTER — Emergency Department (HOSPITAL_COMMUNITY): Payer: Medicare Other

## 2018-12-04 ENCOUNTER — Observation Stay (HOSPITAL_COMMUNITY)
Admission: EM | Admit: 2018-12-04 | Discharge: 2018-12-05 | Disposition: A | Discharge status: Skilled Nursing Facility | Payer: Medicare Other | Attending: Family Medicine | Admitting: Family Medicine

## 2018-12-04 DIAGNOSIS — I998 Other disorder of circulatory system: Secondary | ICD-10-CM | POA: Diagnosis not present

## 2018-12-04 DIAGNOSIS — G40909 Epilepsy, unspecified, not intractable, without status epilepticus: Secondary | ICD-10-CM | POA: Diagnosis not present

## 2018-12-04 DIAGNOSIS — I9589 Other hypotension: Secondary | ICD-10-CM | POA: Insufficient documentation

## 2018-12-04 DIAGNOSIS — J449 Chronic obstructive pulmonary disease, unspecified: Secondary | ICD-10-CM | POA: Insufficient documentation

## 2018-12-04 DIAGNOSIS — C670 Malignant neoplasm of trigone of bladder: Secondary | ICD-10-CM | POA: Diagnosis not present

## 2018-12-04 DIAGNOSIS — L89623 Pressure ulcer of left heel, stage 3: Secondary | ICD-10-CM | POA: Diagnosis not present

## 2018-12-04 DIAGNOSIS — L89613 Pressure ulcer of right heel, stage 3: Secondary | ICD-10-CM | POA: Insufficient documentation

## 2018-12-04 DIAGNOSIS — Z1159 Encounter for screening for other viral diseases: Secondary | ICD-10-CM | POA: Insufficient documentation

## 2018-12-04 DIAGNOSIS — Z7901 Long term (current) use of anticoagulants: Secondary | ICD-10-CM | POA: Insufficient documentation

## 2018-12-04 DIAGNOSIS — Z87442 Personal history of urinary calculi: Secondary | ICD-10-CM | POA: Diagnosis not present

## 2018-12-04 DIAGNOSIS — I252 Old myocardial infarction: Secondary | ICD-10-CM | POA: Diagnosis not present

## 2018-12-04 DIAGNOSIS — Z7982 Long term (current) use of aspirin: Secondary | ICD-10-CM | POA: Insufficient documentation

## 2018-12-04 DIAGNOSIS — Z87891 Personal history of nicotine dependence: Secondary | ICD-10-CM | POA: Insufficient documentation

## 2018-12-04 DIAGNOSIS — E785 Hyperlipidemia, unspecified: Secondary | ICD-10-CM | POA: Insufficient documentation

## 2018-12-04 DIAGNOSIS — E8770 Fluid overload, unspecified: Secondary | ICD-10-CM

## 2018-12-04 DIAGNOSIS — Z9911 Dependence on respirator [ventilator] status: Secondary | ICD-10-CM | POA: Diagnosis not present

## 2018-12-04 DIAGNOSIS — I5022 Chronic systolic (congestive) heart failure: Secondary | ICD-10-CM | POA: Diagnosis not present

## 2018-12-04 DIAGNOSIS — Z955 Presence of coronary angioplasty implant and graft: Secondary | ICD-10-CM | POA: Insufficient documentation

## 2018-12-04 DIAGNOSIS — Z800 Family history of malignant neoplasm of digestive organs: Secondary | ICD-10-CM | POA: Insufficient documentation

## 2018-12-04 DIAGNOSIS — Z8673 Personal history of transient ischemic attack (TIA), and cerebral infarction without residual deficits: Secondary | ICD-10-CM | POA: Diagnosis not present

## 2018-12-04 DIAGNOSIS — Z86718 Personal history of other venous thrombosis and embolism: Secondary | ICD-10-CM | POA: Insufficient documentation

## 2018-12-04 DIAGNOSIS — Z8674 Personal history of sudden cardiac arrest: Secondary | ICD-10-CM | POA: Insufficient documentation

## 2018-12-04 DIAGNOSIS — R918 Other nonspecific abnormal finding of lung field: Secondary | ICD-10-CM | POA: Diagnosis not present

## 2018-12-04 DIAGNOSIS — Z03818 Encounter for observation for suspected exposure to other biological agents ruled out: Secondary | ICD-10-CM | POA: Diagnosis not present

## 2018-12-04 DIAGNOSIS — I482 Chronic atrial fibrillation, unspecified: Secondary | ICD-10-CM | POA: Insufficient documentation

## 2018-12-04 DIAGNOSIS — I96 Gangrene, not elsewhere classified: Secondary | ICD-10-CM | POA: Diagnosis not present

## 2018-12-04 DIAGNOSIS — I70229 Atherosclerosis of native arteries of extremities with rest pain, unspecified extremity: Secondary | ICD-10-CM | POA: Diagnosis present

## 2018-12-04 DIAGNOSIS — I4891 Unspecified atrial fibrillation: Secondary | ICD-10-CM | POA: Diagnosis present

## 2018-12-04 DIAGNOSIS — I2510 Atherosclerotic heart disease of native coronary artery without angina pectoris: Secondary | ICD-10-CM | POA: Insufficient documentation

## 2018-12-04 DIAGNOSIS — I110 Hypertensive heart disease with heart failure: Secondary | ICD-10-CM | POA: Diagnosis not present

## 2018-12-04 DIAGNOSIS — L89153 Pressure ulcer of sacral region, stage 3: Secondary | ICD-10-CM | POA: Insufficient documentation

## 2018-12-04 DIAGNOSIS — Z930 Tracheostomy status: Secondary | ICD-10-CM | POA: Diagnosis not present

## 2018-12-04 DIAGNOSIS — Z79899 Other long term (current) drug therapy: Secondary | ICD-10-CM | POA: Insufficient documentation

## 2018-12-04 DIAGNOSIS — K219 Gastro-esophageal reflux disease without esophagitis: Secondary | ICD-10-CM | POA: Diagnosis not present

## 2018-12-04 DIAGNOSIS — R403 Persistent vegetative state: Secondary | ICD-10-CM | POA: Diagnosis present

## 2018-12-04 DIAGNOSIS — J9610 Chronic respiratory failure, unspecified whether with hypoxia or hypercapnia: Secondary | ICD-10-CM | POA: Diagnosis not present

## 2018-12-04 LAB — C-REACTIVE PROTEIN: CRP: 17.7 mg/dL — ABNORMAL HIGH (ref <1.0)

## 2018-12-04 LAB — CBC WITH DIFFERENTIAL/PLATELET
Abs Immature Granulocytes: 0.1 10*3/uL — ABNORMAL HIGH (ref 0.00–0.07)
Basophils Absolute: 0 10*3/uL (ref 0.0–0.1)
Basophils Relative: 0 %
Eosinophils Absolute: 0.3 10*3/uL (ref 0.0–0.5)
Eosinophils Relative: 4 %
HCT: 27.5 % — ABNORMAL LOW (ref 36.0–46.0)
Hemoglobin: 7.7 g/dL — ABNORMAL LOW (ref 12.0–15.0)
Immature Granulocytes: 1 %
Lymphocytes Relative: 20 %
Lymphs Abs: 1.7 10*3/uL (ref 0.7–4.0)
MCH: 29.6 pg (ref 26.0–34.0)
MCHC: 28 g/dL — ABNORMAL LOW (ref 30.0–36.0)
MCV: 105.8 fL — ABNORMAL HIGH (ref 80.0–100.0)
Monocytes Absolute: 0.7 10*3/uL (ref 0.1–1.0)
Monocytes Relative: 9 %
Neutro Abs: 5.4 10*3/uL (ref 1.7–7.7)
Neutrophils Relative %: 66 %
Platelets: 362 10*3/uL (ref 150–400)
RBC: 2.6 MIL/uL — ABNORMAL LOW (ref 3.87–5.11)
RDW: 21 % — ABNORMAL HIGH (ref 11.5–15.5)
WBC: 8.3 10*3/uL (ref 4.0–10.5)
nRBC: 0.4 % — ABNORMAL HIGH (ref 0.0–0.2)

## 2018-12-04 LAB — BASIC METABOLIC PANEL
Anion gap: 8 (ref 5–15)
BUN: 19 mg/dL (ref 8–23)
CO2: 26 mmol/L (ref 22–32)
Calcium: 8.5 mg/dL — ABNORMAL LOW (ref 8.9–10.3)
Chloride: 105 mmol/L (ref 98–111)
Creatinine, Ser: 0.52 mg/dL (ref 0.44–1.00)
GFR calc Af Amer: >60 mL/min (ref >60)
GFR calc non Af Amer: >60 mL/min (ref >60)
Glucose, Bld: 90 mg/dL (ref 70–99)
Potassium: 4.7 mmol/L (ref 3.5–5.1)
Sodium: 139 mmol/L (ref 135–145)

## 2018-12-04 LAB — MRSA PCR SCREENING: MRSA by PCR: NEGATIVE

## 2018-12-04 LAB — PROTIME-INR
INR: 1.2 (ref 0.8–1.2)
Prothrombin Time: 15.5 seconds — ABNORMAL HIGH (ref 11.4–15.2)

## 2018-12-04 LAB — SEDIMENTATION RATE: Sed Rate: 59 mm/hr — ABNORMAL HIGH (ref 0–22)

## 2018-12-04 LAB — PREALBUMIN: Prealbumin: 12.6 mg/dL — ABNORMAL LOW (ref 18–38)

## 2018-12-04 LAB — SARS CORONAVIRUS 2 BY RT PCR (HOSPITAL ORDER, PERFORMED IN ~~LOC~~ HOSPITAL LAB): SARS Coronavirus 2: NEGATIVE

## 2018-12-04 LAB — APTT: aPTT: 37 seconds — ABNORMAL HIGH (ref 24–36)

## 2018-12-04 LAB — CBG MONITORING, ED: Glucose-Capillary: 94 mg/dL (ref 70–99)

## 2018-12-04 MED ORDER — NYSTATIN 100000 UNIT/ML MT SUSP
5.0000 mL | OROMUCOSAL | Status: DC
  Administered 2018-12-04 – 2018-12-05 (×6): 500000 [IU] via ORAL
  Filled 2018-12-04 (×6): qty 5

## 2018-12-04 MED ORDER — ONDANSETRON HCL 4 MG PO TABS: 4.0000 mg | ORAL_TABLET | Freq: Four times a day (QID) | ORAL | Status: DC | PRN

## 2018-12-04 MED ORDER — ASPIRIN 81 MG PO CHEW
81.0000 mg | CHEWABLE_TABLET | Freq: Every day | ORAL | Status: DC
  Administered 2018-12-05: 81 mg
  Filled 2018-12-04: qty 1

## 2018-12-04 MED ORDER — ISOSOURCE 1.5 CAL PO LIQD: 300.0000 mL | ORAL | Status: DC

## 2018-12-04 MED ORDER — ADULT MULTIVITAMIN LIQUID CH
15.0000 mL | Freq: Every day | ORAL | Status: DC
  Administered 2018-12-05: 15 mL
  Filled 2018-12-04: qty 15

## 2018-12-04 MED ORDER — CHLORHEXIDINE GLUCONATE 0.12 % MT SOLN: 5.0000 mL | OROMUCOSAL | Status: DC

## 2018-12-04 MED ORDER — JEVITY 1.2 CAL PO LIQD
1000.0000 mL | ORAL | Status: DC
  Administered 2018-12-05: 1000 mL
  Filled 2018-12-04 (×2): qty 1000

## 2018-12-04 MED ORDER — ONDANSETRON HCL 4 MG/2ML IJ SOLN: 4.0000 mg | Freq: Four times a day (QID) | INTRAMUSCULAR | Status: DC | PRN

## 2018-12-04 MED ORDER — VALPROIC ACID 250 MG/5ML PO SOLN
250.0000 mg | Freq: Two times a day (BID) | ORAL | Status: DC
  Administered 2018-12-04 – 2018-12-05 (×2): 250 mg
  Filled 2018-12-04 (×3): qty 5

## 2018-12-04 MED ORDER — LEVETIRACETAM 100 MG/ML PO SOLN
1000.0000 mg | Freq: Two times a day (BID) | ORAL | Status: DC
  Administered 2018-12-04 – 2018-12-05 (×2): 1000 mg
  Filled 2018-12-04 (×3): qty 10

## 2018-12-04 MED ORDER — PANTOPRAZOLE SODIUM 40 MG PO PACK
40.0000 mg | PACK | Freq: Every day | ORAL | Status: DC
  Administered 2018-12-05: 40 mg
  Filled 2018-12-04: qty 20

## 2018-12-04 MED ORDER — ACETAMINOPHEN 650 MG RE SUPP: 650.0000 mg | Freq: Four times a day (QID) | RECTAL | Status: DC | PRN

## 2018-12-04 MED ORDER — SODIUM CHLORIDE 0.9% FLUSH: 10.0000 mL | INTRAVENOUS | Status: DC | PRN

## 2018-12-04 MED ORDER — FAMOTIDINE 20 MG PO TABS: 20.0000 mg | ORAL_TABLET | Freq: Every day | ORAL | Status: DC

## 2018-12-04 MED ORDER — ORAL CARE MOUTH RINSE
15.0000 mL | OROMUCOSAL | Status: DC
  Administered 2018-12-04 – 2018-12-05 (×9): 15 mL via OROMUCOSAL

## 2018-12-04 MED ORDER — MIDODRINE HCL 5 MG PO TABS
5.0000 mg | ORAL_TABLET | Freq: Three times a day (TID) | ORAL | Status: DC
  Administered 2018-12-04 – 2018-12-05 (×4): 5 mg
  Filled 2018-12-04 (×4): qty 1

## 2018-12-04 MED ORDER — MIDODRINE HCL 5 MG PO TABS: 5.0000 mg | ORAL_TABLET | Freq: Three times a day (TID) | ORAL | Status: DC

## 2018-12-04 MED ORDER — DIPHENOXYLATE-ATROPINE 2.5-0.025 MG/5ML PO LIQD: 5.0000 mL | Freq: Four times a day (QID) | ORAL | Status: DC | PRN

## 2018-12-04 MED ORDER — CHLORHEXIDINE GLUCONATE CLOTH 2 % EX PADS
6.0000 | MEDICATED_PAD | Freq: Every day | CUTANEOUS | Status: DC
  Administered 2018-12-04: 6 via TOPICAL

## 2018-12-04 MED ORDER — PHENYTOIN 125 MG/5ML PO SUSP
200.0000 mg | Freq: Every day | ORAL | Status: DC
  Administered 2018-12-05: 200 mg
  Filled 2018-12-04: qty 8

## 2018-12-04 MED ORDER — SODIUM CHLORIDE 0.9% FLUSH
10.0000 mL | Freq: Two times a day (BID) | INTRAVENOUS | Status: DC
  Administered 2018-12-04: 22:00:00 10 mL
  Administered 2018-12-05: 20 mL

## 2018-12-04 MED ORDER — PHENYTOIN 125 MG/5ML PO SUSP
400.0000 mg | Freq: Two times a day (BID) | ORAL | Status: DC
  Administered 2018-12-04 – 2018-12-05 (×2): 400 mg
  Filled 2018-12-04 (×3): qty 16

## 2018-12-04 MED ORDER — HYDROCODONE-ACETAMINOPHEN 5-325 MG PO TABS: 1.0000 | ORAL_TABLET | Freq: Four times a day (QID) | ORAL | Status: DC | PRN

## 2018-12-04 MED ORDER — HEPARIN SODIUM (PORCINE) 5000 UNIT/ML IJ SOLN
5000.0000 [IU] | Freq: Three times a day (TID) | INTRAMUSCULAR | Status: DC
  Administered 2018-12-04 – 2018-12-05 (×3): 5000 [IU] via SUBCUTANEOUS
  Filled 2018-12-04 (×3): qty 1

## 2018-12-04 MED ORDER — SODIUM CHLORIDE 0.9% FLUSH
3.0000 mL | Freq: Two times a day (BID) | INTRAVENOUS | Status: DC
  Administered 2018-12-04: 3 mL via INTRAVENOUS

## 2018-12-04 MED ORDER — CHLORHEXIDINE GLUCONATE 0.12% ORAL RINSE (MEDLINE KIT)
15.0000 mL | Freq: Two times a day (BID) | OROMUCOSAL | Status: DC
  Administered 2018-12-04 – 2018-12-05 (×2): 15 mL via OROMUCOSAL

## 2018-12-04 MED ORDER — FAMOTIDINE 40 MG/5ML PO SUSR
20.0000 mg | Freq: Every day | ORAL | Status: DC
  Administered 2018-12-05: 20 mg
  Filled 2018-12-04: qty 2.5

## 2018-12-04 MED ORDER — OSMOLITE 1.5 CAL PO LIQD: 1000.0000 mL | ORAL | Status: DC

## 2018-12-04 MED ORDER — MIDODRINE HCL 5 MG PO TABS
5.0000 mg | ORAL_TABLET | Freq: Once | ORAL | Status: AC
  Administered 2018-12-04: 5 mg
  Filled 2018-12-04: qty 1

## 2018-12-04 MED ORDER — ACETAMINOPHEN 325 MG PO TABS: 650.0000 mg | ORAL_TABLET | Freq: Four times a day (QID) | ORAL | Status: DC | PRN

## 2018-12-04 MED ORDER — ALBUTEROL SULFATE (2.5 MG/3ML) 0.083% IN NEBU: 2.5000 mg | INHALATION_SOLUTION | Freq: Four times a day (QID) | RESPIRATORY_TRACT | Status: DC | PRN

## 2018-12-04 MED ORDER — PRO-STAT SUGAR FREE PO LIQD
30.0000 mL | Freq: Three times a day (TID) | ORAL | Status: DC
  Administered 2018-12-04 – 2018-12-05 (×4): 30 mL
  Filled 2018-12-04 (×4): qty 30

## 2018-12-04 MED ORDER — DIPHENOXYLATE-ATROPINE 2.5-0.025 MG PO TABS: 1.0000 | ORAL_TABLET | Freq: Four times a day (QID) | ORAL | Status: DC | PRN

## 2018-12-04 MED ORDER — VALPROATE SODIUM 250 MG/5ML PO SOLN
250.0000 mg | Freq: Two times a day (BID) | ORAL | Status: DC
  Filled 2018-12-04 (×2): qty 5

## 2018-12-04 NOTE — Progress Notes (Signed)
Spoke with Infection Prevention concerning patient's history of OMDRO. They stated contact precautions should be initiated. Infection Prevention went on to say that since the organism was cultured from the tracheal aspirate, if patient should begin to exhibit respiratory symptoms droplet precautions should be initiated to be on the safe side.

## 2018-12-04 NOTE — ED Triage Notes (Signed)
Pt arrives to ED from [facility] with complaints of and ischemic toe on left foot for noticed yesterday as red and today as black. [facility] RNs reported faint pulses in both feet and fever.

## 2018-12-04 NOTE — Consult Note (Signed)
[facility] Nurse wound consult note Patient receiving care in [place] Reason for Consult: lower extremity wound Wound type: see individual notes Pressure Injury POA: Yes Measurements: Left great toe cold and purple with a darkened tip.  Care for this wound will be apply iodine, allow to air dry, use of prevalon boots at all times. Right posterior heel has an unstageable wound.  This wound measures 1.8 cm x 1.8 cm x unknown depth due to slough in wound bed.  Care for this wound will be apply a hydrocolloid dressing Kellie Simmering [phone]) to the right heel.  Change daily. The left heel has dry, stable eschar.  Care for this wound will include applying iodine, allow to air dry. Keep foot in a Prevalon boot. The dorsum of the left foot has a stage 2 PI that measures 1.2 cm x 1.2 cm, is pink.  The care for this wound is:  Place a small piece of xeroform gauze Kellie Simmering [phone]) to the wound on the dorsum of the left foot.  Cover with a dry gauze.  Change daily. The midback has a stage 2 PI that measures 3.9 cm x 2.2 cm; the wound bed is pink.  The care for this area will be a foam dressing.  Change every 3 days.  The sacrum has 3 - 4 very tiny areas that may be abrasions; all wound beds are pink.  Care for this area will be protectant ointment and a foam dressing.   Monitor the wound area(s) for worsening of condition such as: Signs/symptoms of infection,  Increase in size,  Development of or worsening of odor, Development of pain, or increased pain at the affected locations.  Notify the medical team if any of these develop.  Thank you for the consult.  Discussed plan of care with the bedside nurse.  [facility] nurse will not follow at this time.  Please re-consult the [facility] team if needed.  Val Riles, RN, MSN, CWOCN, CNS-BC, pager [phone]

## 2018-12-04 NOTE — Consult Note (Signed)
Referring Physician: [place]  Patient name: Melissa Cochran MRN: [id number] DOB: Jul 17, 1935 Sex: female  REASON FOR CONSULT: Ischemic tip left first toe  HPI: Melissa Cochran is a 83 y.o. female, vent dependent with trach and feeding tube sent from [place] for eval of black tip of left first toe.  Pt is non communicative.  She was discharged from the hospital about 5 days ago after a bout of urosepsis.  She is non communicative with eyes closed during entire history. Other medical problems include CHF, COPD, hyperlipidemia, hypertension, seizures.    Past Medical History:  Diagnosis Date  . Adnexal mass   . Anxiety   . Arthritis   . Bladder tumor   . Bleeding ulcer   . CHF (congestive heart failure) ([place])   . CHF (congestive heart failure) ([place])   . Chronic bronchitis ([place])   . Chronic respiratory failure ([place]) 04/19/2018  . COPD (chronic obstructive pulmonary disease) ([place])   . Coronary artery disease   . CVA (cerebral vascular accident) ([place]) 04/19/2018  . DVT (deep venous thrombosis) ([facility])    RLE  . GERD (gastroesophageal reflux disease)   . Heart block    left  . History of bleeding ulcers   . History of kidney stones   . Hyperlipemia   . Hypertension   . Myocardial infarction ([place])    "slight one" (07/05/2017)  . Pneumonia    "several times" (07/05/2017)  . Seizures ([place]) 02/2017 "several"; 06/26/2017 X 1  . Shortness of breath dyspnea   . Stroke [facility]) 02/13/2018   "still right sided weakness" (07/05/2017)   Past Surgical History:  Procedure Laterality Date  . ABDOMINAL HYSTERECTOMY    . CARDIAC CATHETERIZATION Left 10/21/2015   Procedure: Left Heart Cath and Coronary Angiography;  Surgeon: Isaias Cowman, MD;  Location: [facility];  Service: Cardiovascular;  Laterality: Left;  . CARDIAC CATHETERIZATION N/A 10/21/2015   Procedure: Coronary Stent Intervention;  Surgeon: Isaias Cowman, MD;  Location: [place] [facility];   Service: Cardiovascular;  Laterality: N/A;  . CARDIAC CATHETERIZATION    . CYSTOSCOPY W/ RETROGRADES Bilateral 01/20/2016   Procedure: CYSTOSCOPY WITH RETROGRADE PYELOGRAM;  Surgeon: Hollice Espy, MD;  Location: [facility];  Service: Urology;  Laterality: Bilateral;  . CYSTOSCOPY WITH STENT PLACEMENT Right 01/20/2016   Procedure: CYSTOSCOPY WITH STENT PLACEMENT;  Surgeon: Hollice Espy, MD;  Location: [facility];  Service: Urology;  Laterality: Right;  . HIP FRACTURE SURGERY Left   . STOMACH SURGERY     "stomach ulcers"  . TRACHEOSTOMY TUBE PLACEMENT N/A 03/02/2017   Procedure: TRACHEOSTOMY;  Surgeon: Rozetta Nunnery, MD;  Location: Home;  Service: ENT;  Laterality: N/A;  . TRANSURETHRAL RESECTION OF BLADDER TUMOR N/A 01/20/2016   Procedure: TRANSURETHRAL RESECTION OF BLADDER TUMOR (TURBT);  Surgeon: Hollice Espy, MD;  Location: [facility];  Service: Urology;  Laterality: N/A;    Family History  Problem Relation Age of Onset  . Stomach cancer Mother   . CVA Father   . Bladder Cancer Neg Hx   . Kidney cancer Neg Hx     SOCIAL HISTORY: Social History   Socioeconomic History  . Marital status: Widowed    Spouse name: Not on file  . Number of children: Not on file  . Years of education: Not on file  . Highest education level: Not on file  Occupational History  . Not on file  Social Needs  . Financial resource strain: Not on file  .  Food insecurity    Worry: Not on file    Inability: Not on file  . Transportation needs    Medical: Not on file    Non-medical: Not on file  Tobacco Use  . Smoking status: Former Smoker    Packs/day: 1.00    Years: 40.00    Pack years: 40.00    Quit date: 06/13/1998    Years since quitting: 20.4  . Smokeless tobacco: Never Used  Substance and Sexual Activity  . Alcohol use: No  . Drug use: No  . Sexual activity: Not Currently  Lifestyle  . Physical activity    Days per week: Not on file    Minutes per session: Not on file  . Stress:  Not on file  Relationships  . Social Herbalist on phone: Not on file    Gets together: Not on file    Attends religious service: Not on file    Active member of club or organization: Not on file    Attends meetings of clubs or organizations: Not on file    Relationship status: Not on file  . Intimate partner violence    Fear of current or ex partner: Not on file    Emotionally abused: Not on file    Physically abused: Not on file    Forced sexual activity: Not on file  Other Topics Concern  . Not on file  Social History Narrative  . Not on file    No Known Allergies  Current Facility-Administered Medications  Medication Dose Route Frequency Provider Last Rate Last Dose  . acetaminophen (TYLENOL) tablet 650 mg  650 mg Oral Q6H PRN Karmen Bongo, MD       Or  . acetaminophen (TYLENOL) suppository 650 mg  650 mg Rectal Q6H PRN Karmen Bongo, MD      . albuterol (PROVENTIL) (2.5 MG/3ML) 0.083% nebulizer solution 2.5 mg  2.5 mg Nebulization Q6H PRN Karmen Bongo, MD      . Derrill Memo ON 12/05/2018] aspirin chewable tablet 81 mg  81 mg Per Tube Daily Karmen Bongo, MD      . chlorhexidine (PERIDEX) 0.12 % solution 5 mL  5 mL Mouth/Throat See admin instructions Karmen Bongo, MD      . diphenoxylate-atropine (LOMOTIL) 2.5-0.025 MG per tablet 1 tablet  1 tablet Per Tube QID PRN Karmen Bongo, MD      . Derrill Memo ON 12/05/2018] famotidine (PEPCID) tablet 20 mg  20 mg Per Tube Daily Karmen Bongo, MD      . feeding supplement (OSMOLITE 1.5 CAL) liquid 1,000 mL  1,000 mL Per Tube Continuous Karmen Bongo, MD      . feeding supplement (PRO-STAT SUGAR FREE 64) liquid 30 mL  30 mL Per Tube TID Karmen Bongo, MD      . heparin injection 5,000 Units  5,000 Units Subcutaneous Camelia Phenes Karmen Bongo, MD      . HYDROcodone-acetaminophen (NORCO/VICODIN) 5-325 MG per tablet 1 tablet  1 tablet Per Tube Q6H PRN Karmen Bongo, MD      . levETIRAcetam (KEPPRA) 100 MG/ML solution 1,000  mg  1,000 mg Per Tube Lillia Mountain, MD      . midodrine (PROAMATINE) tablet 5 mg  5 mg Oral TID WC Karmen Bongo, MD      . Derrill Memo ON 12/05/2018] multivitamin liquid 15 mL  15 mL Per Tube Daily Karmen Bongo, MD      . nystatin (MYCOSTATIN) 100000 UNIT/ML suspension 500,000 Units  5 mL  Oral Salvadore Oxford, MD      . ondansetron [facility]) tablet 4 mg  4 mg Oral Q6H PRN Karmen Bongo, MD       Or  . ondansetron [facility]) injection 4 mg  4 mg Intravenous Q6H PRN Karmen Bongo, MD      . Derrill Memo ON 12/05/2018] pantoprazole sodium (PROTONIX) 40 mg/20 mL oral suspension 40 mg  40 mg Per Tube Daily Karmen Bongo, MD      . phenytoin (DILANTIN) 125 MG/5ML suspension 400 mg  400 mg Per Tube Lillia Mountain, MD      . sodium chloride flush (NS) 0.9 % injection 3 mL  3 mL Intravenous Lillia Mountain, MD      . Valproate Sodium (DEPAKENE) solution 250 mg  250 mg Per Tube Lillia Mountain, MD       Current Outpatient Medications  Medication Sig Dispense Refill  . acetaminophen (TYLENOL) 325 MG tablet Take 650 mg by mouth every 4 (four) hours as needed for mild pain.    Marland Kitchen albuterol (PROVENTIL) (2.5 MG/3ML) 0.083% nebulizer solution Take 2.5 mg by nebulization every 6 (six) hours as needed for wheezing or shortness of breath.    . Amino Acids-Protein Hydrolys (FEEDING SUPPLEMENT, PRO-STAT SUGAR FREE 64,) LIQD Place 30 mLs into feeding tube 3 (three) times daily. 887 mL 0  . aspirin 81 MG chewable tablet Place 1 tablet (81 mg total) into feeding tube daily.    . chlorhexidine (PERIDEX) 0.12 % solution 5 mLs See admin instructions. 5 ml's for trach care 2 times a day    . diphenoxylate-atropine (LOMOTIL) 2.5-0.025 MG tablet Place 1 tablet into feeding tube 4 (four) times daily as needed for diarrhea or loose stools. 30 tablet 0  . enoxaparin (LOVENOX) 40 MG/0.4ML injection Inject 0.4 mLs (40 mg total) into the skin daily. 0 Syringe   . famotidine (PEPCID) 20 MG tablet Place 20 mg  into feeding tube daily.     Marland Kitchen HYDROcodone-acetaminophen (NORCO/VICODIN) 5-325 MG tablet Place 1 tablet into feeding tube every 6 (six) hours as needed for moderate pain. 10 tablet 0  . levETIRAcetam (KEPPRA) 100 MG/ML solution Place 10 mLs (1,000 mg total) into feeding tube 2 (two) times daily. (Patient taking differently: Place 1,000 mg into feeding tube every 12 (twelve) hours. )    . midodrine (PROAMATINE) 5 MG tablet Take 1 tablet (5 mg total) by mouth 3 (three) times daily with meals. (Patient taking differently: Take 5 mg by mouth every 8 (eight) hours. )    . Multiple Vitamin (MULTIVITAMIN) LIQD Place 15 mLs into feeding tube daily.    . Nutritional Supplements (FEEDING SUPPLEMENT, OSMOLITE 1.5 CAL,) LIQD Place 1,000 mLs into feeding tube continuous.  0  . nystatin (MYCOSTATIN) 100000 UNIT/ML suspension Take 5 mLs (500,000 Units total) by mouth 4 (four) times daily. (Patient taking differently: Take 5 mLs by mouth every 4 (four) hours. ) 60 mL 0  . pantoprazole sodium (PROTONIX) 40 mg/20 mL PACK Place 20 mLs (40 mg total) into feeding tube daily. 30 each   . phenytoin (DILANTIN) 125 MG/5ML suspension Place 500 mg into feeding tube every 12 (twelve) hours.     . Skin Protectants, Misc. (CRITIC-AID EX) Apply 1 application topically daily. Apply to buttocks    . Sodium Chloride Flush (NORMAL SALINE FLUSH IV) Inject 10 mLs into the vein See admin instructions. Use 10 ml's via IV every shift for any used lumens- flush each unused lumen    .  Valproate Sodium (DEPAKENE) 250 MG/5ML SOLN solution Place 5 mLs (250 mg total) into feeding tube 2 (two) times daily. (Patient taking differently: Place 250 mg into feeding tube every 12 (twelve) hours. ) 300 mL   . Water For Irrigation, Sterile (FREE WATER) SOLN Place 300 mLs into feeding tube every 4 (four) hours.      ROS:   Unable to obtain pt non verbal  Physical Examination  Vitals:   12/04/18 1230 12/04/18 1245 12/04/18 1300 12/04/18 1330  BP:  (!) 103/53 (!) 96/53 (!) 98/50 110/62  Pulse: 84 83 83 84  Resp: 18 18 16 20   Temp:      TempSrc:      SpO2: 100% 100% 100% 100%    There is no height or weight on file to calculate BMI.  General:  Eyes closed non communicative HEENT: atraumatic Neck: tracheostomy Pulmonary: coarse breath sounds bilaterally Cardiac: Regular Rate and Rhythm  Skin: No rash, 5 cm dark eschar left heel 2 cm left lateral malleolus ulcer with some granulation, right heel 2 cm ulcer with some granulation, tip left first toe dark eschar 2 cm diameter Extremity Pulses:  2+ radial, brachial, 1-2+ femoral, absent popliteal dorsalis pedis, posterior tibial pulses bilaterally Musculoskeletal: diffuse edema legs Neurologic: Upper and lower extremity not moving to command  DATA:  CBC    Component Value Date/Time   WBC 8.3 12/04/2018 1019   RBC 2.60 (L) 12/04/2018 1019   HGB 7.7 (L) 12/04/2018 1019   HGB 13.1 01/23/2014 1125   HCT 27.5 (L) 12/04/2018 1019   HCT 41.3 01/23/2014 1125   PLT 362 12/04/2018 1019   PLT 244 01/23/2014 1125   MCV 105.8 (H) 12/04/2018 1019   MCV 91 01/23/2014 1125   MCH 29.6 12/04/2018 1019   MCHC 28.0 (L) 12/04/2018 1019   RDW 21.0 (H) 12/04/2018 1019   RDW 13.3 01/23/2014 1125   LYMPHSABS 1.7 12/04/2018 1019   LYMPHSABS 1.1 01/23/2014 1125   MONOABS 0.7 12/04/2018 1019   MONOABS 0.5 01/23/2014 1125   EOSABS 0.3 12/04/2018 1019   EOSABS 0.1 01/23/2014 1125   BASOSABS 0.0 12/04/2018 1019   BASOSABS 0.0 01/23/2014 1125    BMET    Component Value Date/Time   NA 139 12/04/2018 1019   NA 141 08/26/2015 1131   NA 131 (L) 01/23/2014 1125   K 4.7 12/04/2018 1019   K 3.9 01/23/2014 1125   CL 105 12/04/2018 1019   CL 94 (L) 01/23/2014 1125   CO2 26 12/04/2018 1019   CO2 32 01/23/2014 1125   GLUCOSE 90 12/04/2018 1019   GLUCOSE 91 01/23/2014 1125   BUN 19 12/04/2018 1019   BUN 16 08/26/2015 1131   BUN 11 01/23/2014 1125   CREATININE 0.52 12/04/2018 1019   CREATININE  0.72 01/23/2014 1125   CALCIUM 8.5 (L) 12/04/2018 1019   CALCIUM 9.4 01/23/2014 1125   GFRNONAA >60 12/04/2018 1019   GFRNONAA >60 01/23/2014 1125   GFRAA >60 12/04/2018 1019   GFRAA >60 01/23/2014 1125    ASSESSMENT:  Evidence of PAD but wounds are all decubitus in nature.  Left heel wound is extensive and probably will not heal other wounds are fairly superficial  She is not a candidate for any revascularization procedures due to baseline non ambulatory debilitated state.  She does not seem to have pain or sepsis from this so I would not proceed with palliative above knee amputations    PLAN:  No vascular surgical intervention planned at  this point Will sign off  Plan and details discussed with daughter Jalissa Heinzelman by phone   Ruta Hinds, MD [facility] Office: [phone] Pager: [phone]

## 2018-12-04 NOTE — ED Provider Notes (Signed)
[facility] EMERGENCY DEPARTMENT Provider Note   CSN: [id number] Arrival date & time: 12/04/18  2694     History   Chief Complaint Chief Complaint  [facility] presents with  . Ischemic Toe  . [facility]    HPI Melissa Cochran is a 83 y.o. female.     Melissa Cochran is an 83 year old female with significant medical history for chronic respiratory failure, prior cardiac arrest who is currently trached and on the vent chronically who has been recently admitted for Klebsiella UTI and sepsis as well as ventilator associated pneumonia who currently has a PICC line in the left upper extremity, prior history of A. fib now on aspirin only due to blood loss anemia several months ago presenting from [facility] with an ischemic left great toe.  Facility states that yesterday Melissa [facility]ran toe looked red but today it had turned purple.  They states Melissa Cochran has had intermittent fevers since discharge on 11/29/2018 but is not requiring any different ventilator settings and blood pressure has been stable.  There is been no change in medications or change in Melissa [facility]ran status.  Melissa Cochran apparently will track things with [facility]ran eyes but is otherwise nonverbal.  Melissa Cochran currently is a full code  The history is provided by the EMS personnel and medical records. The history is limited by the condition of the Melissa Cochran.    Past Medical History:  Diagnosis Date  . Adnexal mass   . Anxiety   . Arthritis   . Bladder tumor   . Bleeding ulcer   . CHF (congestive heart failure) ([place])   . CHF (congestive heart failure) ([place])   . Chronic bronchitis ([place])   . Chronic respiratory failure ([place]) 04/19/2018  . COPD (chronic obstructive pulmonary disease) ([place])   . Coronary artery disease   . CVA (cerebral vascular accident) ([place]) 04/19/2018  . DVT (deep venous thrombosis) (HCC)    RLE  . GERD (gastroesophageal reflux disease)   . Heart block    left  . History of bleeding ulcers   . History of kidney stones   . Hyperlipemia   .  Hypertension   . Myocardial infarction ([place])    "slight one" (07/05/2017)  . Pneumonia    "several times" (07/05/2017)  . Seizures ([place]) 02/2017 "several"; 06/26/2017 X 1  . Shortness of breath dyspnea   . Stroke [facility]) 02/13/2018   "still right sided weakness" (07/05/2017)    [facility] Active Problem List   Diagnosis Date Noted  . Pressure injury of skin 11/20/2018  . Fever 11/20/2018  . Bacteremia due to Klebsiella pneumoniae 11/13/2018  . UTI (urinary tract infection) 11/13/2018  . Sepsis ([place]) 11/12/2018  . Goals of care, counseling/discussion   . Palliative care by specialist   . Meningioma, cerebral ([place]) 09/27/2018  . Other emphysema ([place]) 09/27/2018  . Febrile illness, acute   . Severe sepsis with septic shock ([place]) 09/26/2018  . Hypernatremia 09/26/2018  . AKI (acute kidney injury) ([place]) 09/26/2018  . CVA (cerebral vascular accident) ([place]) 04/19/2018  . Chronic respiratory failure ([place]) 04/19/2018  . E-coli UTI, ESBL  06/30/2017  . A-fib ([place]) 06/30/2017  . Need for protective airway ventilation 06/30/2017  . Seizures ([place]) 06/26/2017  . Pressure ulcer 02/17/2017  . Bilateral lower extremity edema 10/08/2016  . Venous ulcer of ankle ([place]) 07/08/2016  . DVT (deep venous thrombosis) ([place]) 07/02/2016  . S/P coronary artery stent placement 10/30/2015  . 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 Mass of lower  lobe of left lung, incidetnal on CT 09/2015, smoker.  09/29/2015  . Other microscopic hematuria 08/26/2015  . Urge incontinence 08/26/2015  . Essential (primary) hypertension 12/09/2014  . Adnexal mass 11/28/2013  . History of cardiac catheterization 11/28/2013  . Hyperlipidemia 11/28/2013    Past Surgical History:  Procedure Laterality Date  . ABDOMINAL HYSTERECTOMY    . CARDIAC CATHETERIZATION Left 10/21/2015   Procedure: Left Heart Cath and Coronary Angiography;  Surgeon: Isaias Cowman, MD;  Location: [facility] CV LAB;  Service:  Cardiovascular;  Laterality: Left;  . CARDIAC CATHETERIZATION N/A 10/21/2015   Procedure: Coronary Stent Intervention;  Surgeon: Isaias Cowman, MD;  Location: [facility];  Service: Cardiovascular;  Laterality: N/A;  . CARDIAC CATHETERIZATION    . CYSTOSCOPY W/ RETROGRADES Bilateral 01/20/2016   Procedure: CYSTOSCOPY WITH RETROGRADE PYELOGRAM;  Surgeon: Hollice Espy, MD;  Location: [facility] OR[facility];  Service: Urology;  Laterality: Bilateral;  . CYSTOSCOPY WITH STENT PLACEMENT Right 01/20/2016   Procedure: CYSTOSCOPY WITH STENT PLACEMENT;  Surgeon: Hollice Espy, MD;  Location: [facility] OR[facility];  Service: Urology;  Laterality: Right;  . HIP FRACTURE SURGERY Left   . STOMACH SURGERY     "stomach ulcers"  . TRACHEOSTOMY TUBE PLACEMENT N/A 03/02/2017   Procedure: TRACHEOSTOMY;  Surgeon: Rozetta Nunnery, MD;  Location: [facility];  Service: ENT;  Laterality: N/A;  . TRANSURETHRAL RESECTION OF BLADDER TUMOR N/A 01/20/2016   Procedure: TRANSURETHRAL RESECTION OF BLADDER TUMOR (TURBT);  Surgeon: Hollice Espy, MD;  Location: [facility] OR[facility];  Service: Urology;  Laterality: N/A;     OB History   No obstetric history on file.      Home Medications    Prior to Admission medications   Medication Sig Start Date End Date Taking? Authorizing Provider  acetaminophen (TYLENOL) 325 MG tablet Take 650 mg by mouth every 4 (four) hours as needed for mild pain.    [provider]  albuterol (PROVENTIL) (2.5 MG/3ML) 0.083% nebulizer solution Take 2.5 mg by nebulization every 6 (six) hours as needed for wheezing or shortness of breath.    [provider]  Amino Acids-Protein Hydrolys (FEEDING SUPPLEMENT, PRO-STAT SUGAR FREE 64,) LIQD Place 30 mLs into feeding tube 3 (three) times daily. 10/04/18   Erick Colace, NP  aspirin 81 MG chewable tablet Place 1 tablet (81 mg total) into feeding tube daily. 06/19/18   Cherene Altes, MD  chlorhexidine (PERIDEX) 0.12 % solution 5 mLs See admin  instructions. 5 ml's for trach care 2 times a day    [provider]  diphenoxylate-atropine (LOMOTIL) 2.5-0.025 MG tablet Place 1 tablet into feeding tube 4 (four) times daily as needed for diarrhea or loose stools. 11/27/18   Arrien, Jimmy Picket, MD  enoxaparin (LOVENOX) 40 MG/0.4ML injection Inject 0.4 mLs (40 mg total) into the skin daily. 10/05/18   Erick Colace, NP  famotidine (PEPCID) 20 MG tablet Place 20 mg into feeding tube daily.     [provider]  HYDROcodone-acetaminophen (NORCO/VICODIN) 5-325 MG tablet Place 1 tablet into feeding tube every 6 (six) hours as needed for moderate pain. 11/29/18   Darliss Cheney, MD  levETIRAcetam (KEPPRA) 100 MG/ML solution Place 10 mLs (1,000 mg total) into feeding tube 2 (two) times daily. [facility] taking differently: Place 1,000 mg into feeding tube every 12 (twelve) hours.  06/19/18   Cherene Altes, MD  midodrine (PROAMATINE) 5 MG tablet Take 1 tablet (5 mg total) by mouth 3 (three) times daily with meals. [facility] taking differently:  Take 5 mg by mouth every 8 (eight) hours.  10/04/18   Erick Colace, NP  Multiple Vitamin (MULTIVITAMIN) LIQD Place 15 mLs into feeding tube daily. 10/05/18   Erick Colace, NP  Nutritional Supplements (FEEDING SUPPLEMENT, OSMOLITE 1.5 CAL,) LIQD Place 1,000 mLs into feeding tube continuous. 10/04/18   Erick Colace, NP  nystatin (MYCOSTATIN) 100000 UNIT/ML suspension Take 5 mLs (500,000 Units total) by mouth 4 (four) times daily. [facility] taking differently: Take 5 mLs by mouth every 4 (four) hours.  10/04/18   Erick Colace, NP  pantoprazole sodium (PROTONIX) 40 mg/20 mL PACK Place 20 mLs (40 mg total) into feeding tube daily. 10/05/18   Erick Colace, NP  phenytoin (DILANTIN) 125 MG/5ML suspension Place 500 mg into feeding tube every 12 (twelve) hours.     [provider]  Skin Protectants, Misc. (CRITIC-AID EX) Apply 1 application topically daily. Apply to buttocks     [provider]  Sodium Chloride Flush (NORMAL SALINE FLUSH IV) Inject 10 mLs into the vein See admin instructions. Use 10 ml's via IV every shift for any used lumens- flush each unused lumen    [provider]  Valproate Sodium (DEPAKENE) 250 MG/5ML SOLN solution Place 5 mLs (250 mg total) into feeding tube 2 (two) times daily. [facility] taking differently: Place 250 mg into feeding tube every 12 (twelve) hours.  06/19/18   Cherene Altes, MD  Water For Irrigation, Sterile (FREE WATER) SOLN Place 300 mLs into feeding tube every 4 (four) hours. 10/04/18   Erick Colace, NP    Family History Family History  Problem Relation Age of Onset  . Stomach cancer Mother   . CVA Father   . Bladder Cancer Neg Hx   . Kidney cancer Neg Hx     Social History Social History   Tobacco Use  . Smoking status: Former Smoker    Packs/day: 1.00    Years: 40.00    Pack years: 40.00    Quit date: 06/13/1998    Years since quitting: 20.4  . Smokeless tobacco: Never Used  Substance Use Topics  . Alcohol use: No  . Drug use: No     Allergies   Mel[facility]ran has no known allergies.   Review of Systems Review of Systems  Unable to perform ROS: [facility] nonverbal     Physical Exam Updated Vital Signs BP (!) 106/58   Pulse 86   Temp (!) 100.9 F (38.3 C) (Rectal)   Resp 18   SpO2 100%   Physical Exam Vitals signs and nursing note reviewed.  Constitutional:      General: [facility] is not in acute distress.    Appearance: [facility] is well-developed and normal weight.     Comments: Chronically ill-appearing  HENT:     Head: Normocephalic and atraumatic.     Mouth/Throat:     Mouth: Mucous membranes are dry.  Eyes:     Conjunctiva/sclera: Conjunctivae normal.     Pupils: Pupils are equal, round, and reactive to light.  Neck:     Musculoskeletal: Normal range of motion and neck supple.     Comments: [facility]ran in place without any drainage or bleeding from the ostomy Cardiovascular:      Rate and Rhythm: Normal rate and regular rhythm.     Heart sounds: No murmur.  Pulmonary:     Effort: Pulmonary effort is normal. No respiratory distress.     Breath sounds: Normal breath sounds. No wheezing or  rales.     Comments: Scattered coarse breath sounds throughout lung fields Abdominal:     General: There is no distension.     Palpations: Abdomen is soft.     Tenderness: There is no abdominal tenderness. There is no guarding or rebound.  Musculoskeletal: Normal range of motion.        General: No tenderness.     Comments: PICC line present in the left upper arm.  Diffuse edema in the upper and lower extremities which is pitting and 3+.  The left great toe is dusky and cold.  Both feet are cool to the touch without palpable or dopplerable pulses but warm lower extremities and palpable popliteal pulse bilaterally  Skin:    General: Skin is warm and dry.     Findings: No erythema or rash.     Comments: Decubitus wounds over the bilateral heels that are stage III as well as sacral wound at stage III  Neurological:     Mental Status: Mental status is at baseline.     Comments: [facility] grimaces to pain but has no other neurologic response      ED Treatments / Results  Labs (all labs ordered are listed, but only abnormal results are displayed) Labs Reviewed  CBC WITH DIFFERENTIAL/PLATELET - Abnormal; Notable for the following components:      Result Value   RBC 2.60 (*)    Hemoglobin 7.7 (*)    HCT 27.5 (*)    MCV 105.8 (*)    MCHC 28.0 (*)    RDW 21.0 (*)    nRBC 0.4 (*)    Abs Immature Granulocytes 0.10 (*)    All other components within normal limits  BASIC METABOLIC PANEL - Abnormal; Notable for the following components:   Calcium 8.5 (*)    All other components within normal limits  PROTIME-INR - Abnormal; Notable for the following components:   Prothrombin Time 15.5 (*)    All other components within normal limits  APTT - Abnormal; Notable for the following  components:   aPTT 37 (*)    All other components within normal limits  SARS CORONAVIRUS 2 (HOSPITAL ORDER, [facility])  CBG MONITORING, ED    EKG EKG Interpretation  Date/Time:  Monday December 04 2018 09:58:39 EDT Ventricular Rate:  86 PR Interval:    QRS Duration: 80 QT Interval:  438 QTC Calculation: 524 R Axis:   22 Text Interpretation:  Sinus rhythm Anterior infarct, old Prolonged QT interval Baseline wander in lead(s) II III aVL aVF V1 V4 V6 No significant change since last tracing Confirmed by Blanchie Dessert [id number]) on 12/04/2018 10:11:10 AM   Radiology Dg Chest Port 1 View  Result Date: 12/04/2018 CLINICAL DATA:  Recent infection.  Tracheostomy. EXAM: PORTABLE CHEST 1 VIEW COMPARISON:  11/19/2018 FINDINGS: Tracheostomy tube unchanged in positioning. Left-sided PICC line with distal tip at the superior cavoatrial junction. Cardiomediastinal silhouette is stable in size. Calcific aortic knob. Mild diffuse bilateral interstitial opacities with more confluent airspace opacities within the bilateral lower lobes, left worse than right. Findings appear similar the previous study. No pneumothorax. IMPRESSION: Similar patchy bilateral airspace opacities, most pronounced within the lung bases, left greater than right. These findings may represent atelectasis and/or infection. Electronically Signed   By: Davina Poke M.D.   On: 12/04/2018 11:14    Procedures Procedures (including critical care time)  Medications Ordered in ED Medications - No data to display   Initial Impression / Assessment  and Plan / ED Course  I have reviewed the triage vital signs and the nursing notes.  Pertinent labs & imaging results that were available during my care of the [facility]ran were reviewed by me and considered in my medical decision making (see chart for details).        Elderly female who lives at an LTAC due to chronic vent dependence presenting today with an  ischemic left great toe.  Melissa [facility]ran does have a history of atrial fibrillation but is only on aspirin because of prior significant anemia earlier this year which took Melissa [facility]ran off Eliquis.  Melissa Cochran was recently admitted to the hospital 5 days ago with discharged after having Klebsiella UTI sepsis as well as ventilator associated pneumonia.  Spoke with Dr. Oneida Alar who recommended admission to critical care and vascular will consult for most likely amputation.  Is not on any further antibiotics at this time and is currently been getting subcu Lovenox since discharged home on the 23rd. Will need to be discussed with [facility] POA. [facility]ran otherwise appears stable at this time.  Melissa [facility]ran seems to be at Melissa Cochran baseline settings on [facility]ran vent, CBC with normal white count and stable hemoglobin of 7.7, BMP without acute findings and PT and INR within normal limits and PTT basically normal.  Melissa Cochran's EKG is in sinus rhythm at this time.  Will discuss findings with Melissa Co[facility]ran daughter to see if [facility]ran would want Melissa Co[facility]ran mother to have surgery.  11:26 AM [facility] was given a dose of [facility] home Midrin as blood pressures are soft.  X-ray shows patchy infiltrates which could be persistent pneumonia.  Will discuss with critical care if they want to broadly cover again and get tract aspirates. Spoke with [name] Melissa Cochran daughter who states Melissa Cochran would consent with surgery if necessary.  Discussed with critical care who states they will leave a note but because [facility]ran is chronically vented felt that [facility]ran could be a hospitalist admit.  CRITICAL CARE Performed by: Sarh Kirschenbaum Total critical care time: 30 minutes Critical care time was exclusive of separately billable procedures and treating other patients. Critical care was necessary to treat or prevent imminent or life-threatening deterioration. Critical care was time spent personally by me on the following activities: development of treatment plan with [facility]ran and/or surrogate as well as nursing, discussions with  consultants, evaluation of Melissa [facility]ran's response to treatment, examination of [facility], obtaining history from [facility]ran or surrogate, ordering and performing treatments and interventions, ordering and review of laboratory studies, ordering and review of radiographic studies, pulse oximetry and re-evaluation of Melissa Co[facility]ran's condition.   Final Clinical Impressions(s) / ED Diagnoses   Final diagnoses:  Ischemic toe  Dependent on ventilator (HCC)  Chronic hypotension  Hypervolemia, unspecified hypervolemia type    ED Discharge Orders    None       Blanchie Dessert, MD 12/04/18 1203

## 2018-12-04 NOTE — Consult Note (Signed)
NAME:  Melissa Cochran, MRN:  [id number], DOB:  Mar 16, 1936, LOS: 0 ADMISSION DATE:  12/04/2018, CONSULTATION DATE:  7/27 REFERRING MD:  EDP, CHIEF COMPLAINT:  Vent mgmt    Brief History   83[facility] with chronic VDRF, HTN, CAD, chronic systolic heart failure, previous CVA, seizure disorder with recent hospitalization r/t Klebsiella bacteremia and UTI with septic shock just d/c 7/22 by [facility].  She returned to ER 7/27 with ischemic L toe.  PCCM consulted for vent mgmt.   History of present illness   83[facility] female [facility] resident with chronic VDRF, HTN, CAD, chronic systolic heart failure, previous CVA, seizure disorder with recent hospitalization r/t UTI with sepsis and acute on chronic AMS just d/c 7/22.  She returned to ER 7/27 with ischemic L toe.  PCCM consulted for vent mgmt.  Past Medical History   Past Medical History:  Diagnosis Date   Adnexal mass    Anxiety    Arthritis    Bladder tumor    Bleeding ulcer    CHF (congestive heart failure) (HCC)    CHF (congestive heart failure) (HCC)    Chronic bronchitis (HCC)    Chronic respiratory failure ([place]) 04/19/2018   COPD (chronic obstructive pulmonary disease) (HCC)    Coronary artery disease    CVA (cerebral vascular accident) ([place]) 04/19/2018   DVT (deep venous thrombosis) (HCC)    RLE   GERD (gastroesophageal reflux disease)    Heart block    left   History of bleeding ulcers    History of kidney stones    Hyperlipemia    Hypertension    Myocardial infarction ([place])    "slight one" (07/05/2017)   Pneumonia    "several times" (07/05/2017)   Seizures ([place]) 02/2017 "several"; 06/26/2017 X 1   Shortness of breath dyspnea    Stroke ([place]) 02/13/2018   "still right sided weakness" (07/05/2017)     Significant Hospital Events     Consults:  PCCM    Procedures:    Significant Diagnostic Tests:    Micro Data:    Antimicrobials:    Interim history/subjective:   Seen in ER.  Comfortable on vent.  Objective   Blood pressure (!) 97/55, pulse 83, temperature (!) 100.9 F (38.3 C), temperature source Rectal, resp. rate 17, SpO2 100 %.    Vent Mode: PCV FiO2 (%):  [50 %] 50 % Set Rate:  [20 bmp] 20 bmp PEEP:  [5 cmH20] 5 cmH20 Plateau Pressure:  [25 cmH20-26 cmH20] 25 cmH20  No intake or output data in the 24 hours ending 12/04/18 1223 There were no vitals filed for this visit.  Examination: General: Acutely and chronically appearing female no acute distress on vent HENT: Mucous membranes moist, trach clean and dry Lungs: Respirations are even and nonlabored on baseline PC vent settings, diminished, few scattered rhonchi Cardiovascular: S1-S2 regular rate and rhythm Abdomen: Round, soft, does not appear tender Extremities: Warm and dry, 1-2+ BLE edema, left great toe erythematous, cold, dark area at the tip Neuro: Chronic AMS, opens eyes, appears to track at times, does not follow commands, does not interact   Resolved Hospital Problem list     Assessment & Plan:  Chronic vent dependent respiratory failure now with left great toe ischemia. Plan- Admit to [facility]-can go to stepdown unit Vascular consult Continue current baseline vent settings No need for ABG at this time Intermittent follow-up chest x-ray Consider gentle diuresis if blood pressure will tolerate Trach care Ongoing  goals of care discussions PCCM will follow-up 1-2X weekly for vent management-please call if needed in the interim  Discussed with EDP   Labs   CBC: Recent Labs  Lab 11/29/18 0614 12/04/18 1019  WBC 10.0 8.3  NEUTROABS 6.5 5.4  HGB 8.3* 7.7*  HCT 28.3* 27.5*  MCV 102.9* 105.8*  PLT 300 654    Basic Metabolic Panel: Recent Labs  Lab 11/29/18 0614 12/04/18 1019  NA 141 139  K 4.3 4.7  CL 114* 105  CO2 20* 26  GLUCOSE 244* 90  BUN 62* 19  CREATININE 0.56 0.52  CALCIUM 7.9* 8.5*   GFR: Estimated Creatinine Clearance: 62.9 mL/min (by C-G  formula based on SCr of 0.52 mg/dL). Recent Labs  Lab 11/29/18 0614 12/04/18 1019  WBC 10.0 8.3    Liver Function Tests: No results for input(s): AST, ALT, ALKPHOS, BILITOT, PROT, ALBUMIN in the last 168 hours. No results for input(s): LIPASE, AMYLASE in the last 168 hours. No results for input(s): AMMONIA in the last 168 hours.  ABG    Component Value Date/Time   PHART 7.470 (H) 09/26/2018 1758   PCO2ART 44.2 09/26/2018 1758   PO2ART 65.0 (L) 09/26/2018 1758   HCO3 13.5 (L) 11/11/2018 2245   TCO2 14 (L) 11/11/2018 2245   ACIDBASEDEF 9.0 (H) 11/11/2018 2245   O2SAT 99.0 11/11/2018 2245     Coagulation Profile: Recent Labs  Lab 12/04/18 1019  INR 1.2    Cardiac Enzymes: No results for input(s): CKTOTAL, CKMB, CKMBINDEX, TROPONINI in the last 168 hours.  HbA1C: Hgb A1c MFr Bld  Date/Time Value Ref Range Status  02/13/2017 09:35 PM 5.1 4.8 - 5.6 % Final    Comment:    (NOTE) Pre diabetes:          5.7%-6.4% Diabetes:              >6.4% Glycemic control for   <7.0% adults with diabetes     CBG: Recent Labs  Lab 11/28/18 2336 11/29/18 0344 11/29/18 0746 11/29/18 1133 12/04/18 1014  GLUCAP 104* 80 92 132* 94    Review of Systems:   Unable. As per HPI above obtained from records and staff   Past Medical History  She,  has a past medical history of Adnexal mass, Anxiety, Arthritis, Bladder tumor, Bleeding ulcer, CHF (congestive heart failure) ([place]), CHF (congestive heart failure) ([place]), Chronic bronchitis ([place]), Chronic respiratory failure ([place]) (04/19/2018), COPD (chronic obstructive pulmonary disease) ([place]), Coronary artery disease, CVA (cerebral vascular accident) ([place]) (04/19/2018), DVT (deep venous thrombosis) ([place]), GERD (gastroesophageal reflux disease), Heart block, History of bleeding ulcers, History of kidney stones, Hyperlipemia, Hypertension, Myocardial infarction ([place]), Pneumonia, Seizures ([place]) (02/2017 "several"; 06/26/2017 X 1), Shortness of  breath dyspnea, and Stroke ([place]) (02/13/2018).   Surgical History    Past Surgical History:  Procedure Laterality Date   ABDOMINAL HYSTERECTOMY     CARDIAC CATHETERIZATION Left 10/21/2015   Procedure: Left Heart Cath and Coronary Angiography;  Surgeon: Isaias Cowman, MD;  Location: [facility] [place] [facility];  Service: Cardiovascular;  Laterality: Left;   CARDIAC CATHETERIZATION N/A 10/21/2015   Procedure: Coronary Stent Intervention;  Surgeon: Isaias Cowman, MD;  Location: [place] [facility];  Service: Cardiovascular;  Laterality: N/A;   CARDIAC CATHETERIZATION     CYSTOSCOPY W/ RETROGRADES Bilateral 01/20/2016   Procedure: CYSTOSCOPY WITH RETROGRADE PYELOGRAM;  Surgeon: Hollice Espy, MD;  Location: [facility];  Service: Urology;  Laterality: Bilateral;   CYSTOSCOPY WITH STENT PLACEMENT Right 01/20/2016   Procedure: CYSTOSCOPY  WITH STENT PLACEMENT;  Surgeon: Hollice Espy, MD;  Location: [facility];  Service: Urology;  Laterality: Right;   HIP FRACTURE SURGERY Left    STOMACH SURGERY     "stomach ulcers"   TRACHEOSTOMY TUBE PLACEMENT N/A 03/02/2017   Procedure: TRACHEOSTOMY;  Surgeon: Rozetta Nunnery, MD;  Location: [place];  Service: ENT;  Laterality: N/A;   TRANSURETHRAL RESECTION OF BLADDER TUMOR N/A 01/20/2016   Procedure: TRANSURETHRAL RESECTION OF BLADDER TUMOR (TURBT);  Surgeon: Hollice Espy, MD;  Location: [facility];  Service: Urology;  Laterality: N/A;     Social History   reports that she quit smoking about 20 years ago. She has a 40.00 pack-year smoking history. She has never used smokeless tobacco. She reports that she does not drink alcohol or use drugs.   Family History   Her family history includes CVA in her father; Stomach cancer in her mother. There is no history of Bladder Cancer or Kidney cancer.   Allergies No Known Allergies   Home Medications  Prior to Admission medications   Medication Sig Start Date End Date Taking? Authorizing Provider    acetaminophen (TYLENOL) 325 MG tablet Take 650 mg by mouth every 4 (four) hours as needed for mild pain.    [provider]  albuterol (PROVENTIL) (2.5 MG/3ML) 0.083% nebulizer solution Take 2.5 mg by nebulization every 6 (six) hours as needed for wheezing or shortness of breath.    [provider]  Amino Acids-Protein Hydrolys (FEEDING SUPPLEMENT, PRO-STAT SUGAR FREE 64,) LIQD Place 30 mLs into feeding tube 3 (three) times daily. 10/04/18   Erick Colace, NP  aspirin 81 MG chewable tablet Place 1 tablet (81 mg total) into feeding tube daily. 06/19/18   Cherene Altes, MD  chlorhexidine (PERIDEX) 0.12 % solution 5 mLs See admin instructions. 5 ml's for trach care 2 times a day    [provider]  diphenoxylate-atropine (LOMOTIL) 2.5-0.025 MG tablet Place 1 tablet into feeding tube 4 (four) times daily as needed for diarrhea or loose stools. 11/27/18   Arrien, Jimmy Picket, MD  enoxaparin (LOVENOX) 40 MG/0.4ML injection Inject 0.4 mLs (40 mg total) into the skin daily. 10/05/18   Erick Colace, NP  famotidine (PEPCID) 20 MG tablet Place 20 mg into feeding tube daily.     [provider]  HYDROcodone-acetaminophen (NORCO/VICODIN) 5-325 MG tablet Place 1 tablet into feeding tube every 6 (six) hours as needed for moderate pain. 11/29/18   Darliss Cheney, MD  levETIRAcetam (KEPPRA) 100 MG/ML solution Place 10 mLs (1,000 mg total) into feeding tube 2 (two) times daily. Patient taking differently: Place 1,000 mg into feeding tube every 12 (twelve) hours.  06/19/18   Cherene Altes, MD  midodrine (PROAMATINE) 5 MG tablet Take 1 tablet (5 mg total) by mouth 3 (three) times daily with meals. Patient taking differently: Take 5 mg by mouth every 8 (eight) hours.  10/04/18   Erick Colace, NP  Multiple Vitamin (MULTIVITAMIN) LIQD Place 15 mLs into feeding tube daily. 10/05/18   Erick Colace, NP  Nutritional Supplements (FEEDING SUPPLEMENT, OSMOLITE 1.5 CAL,) LIQD  Place 1,000 mLs into feeding tube continuous. 10/04/18   Erick Colace, NP  nystatin (MYCOSTATIN) 100000 UNIT/ML suspension Take 5 mLs (500,000 Units total) by mouth 4 (four) times daily. Patient taking differently: Take 5 mLs by mouth every 4 (four) hours.  10/04/18   Erick Colace, NP  pantoprazole sodium (PROTONIX) 40 mg/20 mL PACK Place 20 mLs (40  mg total) into feeding tube daily. 10/05/18   Erick Colace, NP  phenytoin (DILANTIN) 125 MG/5ML suspension Place 500 mg into feeding tube every 12 (twelve) hours.     [provider]  Skin Protectants, Misc. (CRITIC-AID EX) Apply 1 application topically daily. Apply to buttocks    [provider]  Sodium Chloride Flush (NORMAL SALINE FLUSH IV) Inject 10 mLs into the vein See admin instructions. Use 10 ml's via IV every shift for any used lumens- flush each unused lumen    [provider]  Valproate Sodium (DEPAKENE) 250 MG/5ML SOLN solution Place 5 mLs (250 mg total) into feeding tube 2 (two) times daily. Patient taking differently: Place 250 mg into feeding tube every 12 (twelve) hours.  06/19/18   Cherene Altes, MD  Water For Irrigation, Sterile (FREE WATER) SOLN Place 300 mLs into feeding tube every 4 (four) hours. 10/04/18   Erick Colace, NP      Nickolas Madrid, NP 12/04/2018  12:23 PM Pager: [phone] or [phone]

## 2018-12-04 NOTE — H&P (Addendum)
History and Physical    Melissa Cochran ZDG:[id number] DOB: [birth year] DOA: 12/04/2018  PCP: [name], MD  Patient coming from: [facility]; NOK: Daughter, Adriyana Greenbaum, [phone]  Chief Complaint: ischemic toe  HPI: Melissa Cochran is a 83 y.o. female with medical history significant of chronic atrial fibrillation on apixaban, hypertension, coronary artery disease status post stent in 3329, chronic systolic heart failure with EF of 45 to 50% by echo on 06/28/2017, nephrolithiasis complicated by hydronephrosis status post stent placement in 2017, history of bilateral hydronephrosis, CVA, seizure disorder complicated by chronic respiratory failure with ventilator dependence and PEG tube placement presenting from [facility] for ischemic great toe on the left foot.  Her baseline is able to open eyes and at this time she is unable to do that.    She has had 4 prior hospitalizations in 2020: 5/1-88 - sepsis of uncertain etiology, possibly tracheobronchitis vs. PNA 4/4-17 - B PNA with severe sepsis 5/19-27 - proteus UTI with possible VAP and possible celluitis 7/4-22 - septic shock due to Klebsiella bacteremia and UTI   ED Course:  Ischemic great toe.  Vascular would like her admitted by [facility].  She is chronically vented and so can go to SDU.  PCCM will consult.  Daughter prefers that patient have surgery despite futile care.  Appears to need diuresis.  Does not appear to have sepsis.  Review of Systems: unable to perform   Ambulatory Status:   Nonambulatory  PMH, PSH, SH, and FH reviewed in Epic  Past Medical History:  Diagnosis Date  . Adnexal mass   . Anxiety   . Arthritis   . Bladder tumor   . Bleeding ulcer   . CHF (congestive heart failure) ([place])   . CHF (congestive heart failure) ([place])   . Chronic bronchitis ([place])   . Chronic respiratory failure ([place]) 04/19/2018  . COPD (chronic obstructive pulmonary disease) ([place])   . Coronary artery disease   . CVA  (cerebral vascular accident) ([place]) 04/19/2018  . DVT (deep venous thrombosis) (H[facility])    RLE  . GERD (gastroesophageal reflux disease)   . Heart block    left  . History of bleeding ulcers   . History of kidney stones   . Hyperlipemia   . Hypertension   . Myocardial infarction ([place])    "slight one" (07/05/2017)  . Pneumonia    "several times" (07/05/2017)  . Seizures ([place]) 02/2017 "several"; 06/26/2017 X 1  . Shortness of breath dyspnea   . Stroke [facility]) 02/13/2018   "still right sided weakness" (07/05/2017)    Past Surgical History:  Procedure Laterality Date  . ABDOMINAL HYSTERECTOMY    . CARDIAC CATHETERIZATION Left 10/21/2015   Procedure: Left Heart Cath and Coronary Angiography;  Surgeon: Isaias Cowman, MD;  Location: [place] [facility];  Service: Cardiovascular;  Laterality: Left;  . CARDIAC CATHETERIZATION N/A 10/21/2015   Procedure: Coronary Stent Intervention;  Surgeon: Isaias Cowman, MD;  Location: [place] [facility];  Service: Cardiovascular;  Laterality: N/A;  . CARDIAC CATHETERIZATION    . CYSTOSCOPY W/ RETROGRADES Bilateral 01/20/2016   Procedure: CYSTOSCOPY WITH RETROGRADE PYELOGRAM;  Surgeon: Hollice Espy, MD;  Location: [facility];  Service: Urology;  Laterality: Bilateral;  . CYSTOSCOPY WITH STENT PLACEMENT Right 01/20/2016   Procedure: CYSTOSCOPY WITH STENT PLACEMENT;  Surgeon: Hollice Espy, MD;  Location: [facility];  Service: Urology;  Laterality: Right;  . HIP FRACTURE SURGERY Left   . STOMACH SURGERY     "stomach ulcers"  .  TRACHEOSTOMY TUBE PLACEMENT N/A 03/02/2017   Procedure: TRACHEOSTOMY;  Surgeon: Rozetta Nunnery, MD;  Location: [place];  Service: ENT;  Laterality: N/A;  . TRANSURETHRAL RESECTION OF BLADDER TUMOR N/A 01/20/2016   Procedure: TRANSURETHRAL RESECTION OF BLADDER TUMOR (TURBT);  Surgeon: Hollice Espy, MD;  Location: [facility];  Service: Urology;  Laterality: N/A;    Social History   Socioeconomic History  . Marital status:  Widowed    Spouse name: Not on file  . Number of children: Not on file  . Years of education: Not on file  . Highest education level: Not on file  Occupational History  . Not on file  Social Needs  . Financial resource strain: Not on file  . Food insecurity    Worry: Not on file    Inability: Not on file  . Transportation needs    Medical: Not on file    Non-medical: Not on file  Tobacco Use  . Smoking status: Former Smoker    Packs/day: 1.00    Years: 40.00    Pack years: 40.00    Quit date: 06/13/1998    Years since quitting: 20.4  . Smokeless tobacco: Never Used  Substance and Sexual Activity  . Alcohol use: No  . Drug use: No  . Sexual activity: Not Currently  Lifestyle  . Physical activity    Days per week: Not on file    Minutes per session: Not on file  . Stress: Not on file  Relationships  . Social Herbalist on phone: Not on file    Gets together: Not on file    Attends religious service: Not on file    Active member of club or organization: Not on file    Attends meetings of clubs or organizations: Not on file    Relationship status: Not on file  . Intimate partner violence    Fear of current or ex partner: Not on file    Emotionally abused: Not on file    Physically abused: Not on file    Forced sexual activity: Not on file  Other Topics Concern  . Not on file  Social History Narrative  . Not on file    No Known Allergies  Family History  Problem Relation Age of Onset  . Stomach cancer Mother   . CVA Father   . Bladder Cancer Neg Hx   . Kidney cancer Neg Hx     Prior to Admission medications   Medication Sig Start Date End Date Taking? Authorizing Provider  acetaminophen (TYLENOL) 325 MG tablet Take 650 mg by mouth every 4 (four) hours as needed for mild pain.    [provider]  albuterol (PROVENTIL) (2.5 MG/3ML) 0.083% nebulizer solution Take 2.5 mg by nebulization every 6 (six) hours as needed for wheezing or shortness of  breath.    [provider]  Amino Acids-Protein Hydrolys (FEEDING SUPPLEMENT, PRO-STAT SUGAR FREE 64,) LIQD Place 30 mLs into feeding tube 3 (three) times daily. 10/04/18   Erick Colace, NP  aspirin 81 MG chewable tablet Place 1 tablet (81 mg total) into feeding tube daily. 06/19/18   Cherene Altes, MD  chlorhexidine (PERIDEX) 0.12 % solution 5 mLs See admin instructions. 5 ml's for trach care 2 times a day    [provider]  diphenoxylate-atropine (LOMOTIL) 2.5-0.025 MG tablet Place 1 tablet into feeding tube 4 (four) times daily as needed for diarrhea or loose stools. 11/27/18   Arrien, Mauricio  Quillian Quince, MD  enoxaparin (LOVENOX) 40 MG/0.4ML injection Inject 0.4 mLs (40 mg total) into the skin daily. 10/05/18   Erick Colace, NP  famotidine (PEPCID) 20 MG tablet Place 20 mg into feeding tube daily.     [provider]  HYDROcodone-acetaminophen (NORCO/VICODIN) 5-325 MG tablet Place 1 tablet into feeding tube every 6 (six) hours as needed for moderate pain. 11/29/18   Darliss Cheney, MD  levETIRAcetam (KEPPRA) 100 MG/ML solution Place 10 mLs (1,000 mg total) into feeding tube 2 (two) times daily. Patient taking differently: Place 1,000 mg into feeding tube every 12 (twelve) hours.  06/19/18   Cherene Altes, MD  midodrine (PROAMATINE) 5 MG tablet Take 1 tablet (5 mg total) by mouth 3 (three) times daily with meals. Patient taking differently: Take 5 mg by mouth every 8 (eight) hours.  10/04/18   Erick Colace, NP  Multiple Vitamin (MULTIVITAMIN) LIQD Place 15 mLs into feeding tube daily. 10/05/18   Erick Colace, NP  Nutritional Supplements (FEEDING SUPPLEMENT, OSMOLITE 1.5 CAL,) LIQD Place 1,000 mLs into feeding tube continuous. 10/04/18   Erick Colace, NP  nystatin (MYCOSTATIN) 100000 UNIT/ML suspension Take 5 mLs (500,000 Units total) by mouth 4 (four) times daily. Patient taking differently: Take 5 mLs by mouth every 4 (four) hours.  10/04/18   Erick Colace, NP  pantoprazole sodium (PROTONIX) 40 mg/20 mL PACK Place 20 mLs (40 mg total) into feeding tube daily. 10/05/18   Erick Colace, NP  phenytoin (DILANTIN) 125 MG/5ML suspension Place 500 mg into feeding tube every 12 (twelve) hours.     [provider]  Skin Protectants, Misc. (CRITIC-AID EX) Apply 1 application topically daily. Apply to buttocks    [provider]  Sodium Chloride Flush (NORMAL SALINE FLUSH IV) Inject 10 mLs into the vein See admin instructions. Use 10 ml's via IV every shift for any used lumens- flush each unused lumen    [provider]  Valproate Sodium (DEPAKENE) 250 MG/5ML SOLN solution Place 5 mLs (250 mg total) into feeding tube 2 (two) times daily. Patient taking differently: Place 250 mg into feeding tube every 12 (twelve) hours.  06/19/18   Cherene Altes, MD  Water For Irrigation, Sterile (FREE WATER) SOLN Place 300 mLs into feeding tube every 4 (four) hours. 10/04/18   Erick Colace, NP    Physical Exam: Vitals:   12/04/18 1330 12/04/18 1511 12/04/18 1600 12/04/18 1616  BP: 110/62  104/69   Pulse: 84  83   Resp: 20  20   Temp:    98 F (36.7 C)  TempSrc:    Oral  SpO2: 100% 99% 100%       General: Obese, with trach/peg; able to open eyes at baseline but currently unresponsive  Eyes:   normal lids, eyes closed  ENT: trach in place, on vent  Cardiovascular:  RRR, no m/r/g.   Respiratory:  Coarse breath sounds diffusely. Normal respiratory effort on vent.  Abdomen:  soft, NT, ND, NABS; G tube C/D/I; suprapubic cath in place . Skin: L great toe ischemia with mild surrounding erythema        Musculoskeletal: atrophic with increased tone  Psychiatric: chronically unresponsive/vegetative  Neurologic: unable to perform    Radiological Exams on Admission: Dg Chest Port 1 View  Result Date: 12/04/2018 CLINICAL DATA:  Recent infection.  Tracheostomy. EXAM: PORTABLE CHEST 1 VIEW COMPARISON:  11/19/2018  FINDINGS: Tracheostomy tube unchanged in positioning. Left-sided PICC line with  distal tip at the superior cavoatrial junction. Cardiomediastinal silhouette is stable in size. Calcific aortic knob. Mild diffuse bilateral interstitial opacities with more confluent airspace opacities within the bilateral lower lobes, left worse than right. Findings appear similar the previous study. No pneumothorax. IMPRESSION: Similar patchy bilateral airspace opacities, most pronounced within the lung bases, left greater than right. These findings may represent atelectasis and/or infection. Electronically Signed   By: Davina Poke M.D.   On: 12/04/2018 11:14    EKG: Independently reviewed.  NSR with rate 86; prolonged QT 524; nonspecific ST changes with no evidence of acute ischemia; NSCSLT   Labs on Admission: I have personally reviewed the available labs and imaging studies at the time of the admission.  Pertinent labs:   Unremarkable BMP WBC 8.3 Hgb 7.7; 8.3 on 7/22 INR 1.2 COVID negative today and also 7/20; 7/4; 5/27; 5/19; and 4/4 Prealbumin 12.6 CRP 17.7  Assessment/Plan Principal Problem:   Critical lower limb ischemia Active Problems:   Seizures (HCC)   A-fib (HCC)   Chronic respiratory failure requiring continuous mechanical ventilation through tracheostomy ([place])   Tracheostomy dependence (HCC)   Permanent vegetative state ([place])     Ischemic left great toe -She was transported over from [facility] for vascular surgery evaluation -Initial plan was for probable amputation and her daughter agreed to this despite patient's baseline vegetative state -However, vascular surgery has evaluated the patient and determined that she is not a candidate for revascularization procedures -Additionally, she is not having pain or sepsis and therefore does not require a palliative AKA -Vascular surgery communicated this information to her daughter -Vascular surgery has signed off at this time -Wound care  was also consulted and has signed off -Continue ASA 81 mg daily  Chronic respiratory failure -Continue vent management -Consider ethics committee consult, as palliative care has been involved in this patient with chronic vegetative state without success -PCCM has consulted and will follow intermittently for vent management -She has a h/o MDRO and so Infection Prevention was consulted; she has been placed on contact precautions and will need droplet precautions if she develops respiratory symptoms  Afib -Rate controlled without medication -No AC  Seizures -Continue Keppra, Dilantin, Depakene  HTN -Now off medications and on Midodrine for chronic hypotension   Note: This patient has been tested and is negative for the novel coronavirus COVID-19.   DVT prophylaxis:Heparin Code Status:Full - confirmed with family during prior hospitalization Family Communication:None present Disposition Plan:Back to LTAC once clinically improved Consults called:PCCM; vascular surgery; wound care; PVN; SW; nutrition Admission status:Admit - It is my clinical opinion that admission to INPATIENTis reasonable and necessary because of the expectation that this patient will require hospital care that crosses at least 2 midnights to treat this condition based on the medical complexity of the problems presented. Given the aforementioned information, the predictability of an adverse outcome is felt to be significant.     Karmen Bongo MD [facility]   How to contact the [facility] Attending or Consulting provider [place] or covering provider during after hours [place], for this patient?  1. Check the care team in [facility] and look for a) attending/consulting [facility] provider listed and b) the [facility] team listed 2. Log into [email or web address] and use [place]'s universal password to access. If you do not have the password, please contact the hospital operator. 3. Locate the [facility] provider you are looking  for under [facility] and page to a number that you can be directly reached.  4. If you still have difficulty reaching the provider, please page the [facility] (Director on Call) for the Hospitalists listed on amion for assistance.   12/04/2018, 5:19 PM

## 2018-12-05 DIAGNOSIS — I998 Other disorder of circulatory system: Secondary | ICD-10-CM | POA: Diagnosis present

## 2018-12-05 LAB — BASIC METABOLIC PANEL
Anion gap: 9 (ref 5–15)
BUN: 18 mg/dL (ref 8–23)
CO2: 25 mmol/L (ref 22–32)
Calcium: 8.5 mg/dL — ABNORMAL LOW (ref 8.9–10.3)
Chloride: 104 mmol/L (ref 98–111)
Creatinine, Ser: 0.51 mg/dL (ref 0.44–1.00)
GFR calc Af Amer: >60 mL/min (ref >60)
GFR calc non Af Amer: >60 mL/min (ref >60)
Glucose, Bld: 76 mg/dL (ref 70–99)
Potassium: 4 mmol/L (ref 3.5–5.1)
Sodium: 138 mmol/L (ref 135–145)

## 2018-12-05 LAB — CBC
HCT: 26.2 % — ABNORMAL LOW (ref 36.0–46.0)
Hemoglobin: 7.5 g/dL — ABNORMAL LOW (ref 12.0–15.0)
MCH: 30.1 pg (ref 26.0–34.0)
MCHC: 28.6 g/dL — ABNORMAL LOW (ref 30.0–36.0)
MCV: 105.2 fL — ABNORMAL HIGH (ref 80.0–100.0)
Platelets: 336 10*3/uL (ref 150–400)
RBC: 2.49 MIL/uL — ABNORMAL LOW (ref 3.87–5.11)
RDW: 21.2 % — ABNORMAL HIGH (ref 11.5–15.5)
WBC: 6.9 10*3/uL (ref 4.0–10.5)
nRBC: 0.3 % — ABNORMAL HIGH (ref 0.0–0.2)

## 2018-12-05 LAB — PROCALCITONIN: Procalcitonin: 0.76 ng/mL

## 2018-12-05 MED ORDER — ALTEPLASE 2 MG IJ SOLR
2.0000 mg | Freq: Once | INTRAMUSCULAR | Status: AC
  Administered 2018-12-05: 2 mg

## 2018-12-05 MED ORDER — HYDROCODONE-ACETAMINOPHEN 5-325 MG PO TABS: 1.0000 | ORAL_TABLET | Freq: Four times a day (QID) | ORAL | 0 refills | Status: AC | PRN

## 2018-12-05 MED ORDER — ALBUMIN HUMAN 5 % IV SOLN
12.5000 g | Freq: Once | INTRAVENOUS | Status: AC
  Administered 2018-12-05: 12.5 g via INTRAVENOUS
  Filled 2018-12-05: qty 250

## 2018-12-05 NOTE — Discharge Instructions (Signed)
Wound Care 1. Apply a hydrocolloid dressing to right heel; change daily. 2. Apply betadine to the left great toe, allow to air dry. Leave open to air. Keep foot in prevalon boot.  3. Apply betadine to the eschar on left heel, allow to air dry. Leave open to air. Keep foot in a prevalon boot. 4. Place a small piece of xeroform gauze to the wound on the dorsum of the left foot. Change daily. 5. Apply a foam dressing to the mid upper back wound. Change every 3 days and prn. 6. Apply protectant ointment to the tiny, scattered areas of the sacrum. Cover with a foam dressing. Change daily.   Peripheral Vascular Disease Peripheral vascular disease (PVD) is a disease of the blood vessels. A simple term for PVD is poor circulation. In most cases, PVD narrows the blood vessels that carry blood from your heart to the rest of your body. This can result in a decreased supply of blood to your arms, legs, and internal organs, like your stomach or kidneys. However, it most often affects a persons lower legs and feet. There are two types of PVD.  Organic PVD. This is the more common type. It is caused by damage to the structure of blood vessels.  Functional PVD. This is caused by conditions that make blood vessels contract and tighten (spasm). Without treatment, PVD tends to get worse over time. PVD can also lead to acute limb ischemia. This is when an arm or leg suddenly has trouble getting enough blood. This is a medical emergency. What are the causes?  Each type of PVD has many different causes. The most common cause of PVD is buildup of a fatty material (plaque) inside your arteries (atherosclerosis). Small amounts of plaque can break off from the walls of the blood vessels and become lodged in a smaller artery. This blocks blood flow and can cause acute limb ischemia. Other common causes of PVD include:  Blood clots that form inside of blood vessels.  Injuries to blood vessels.  Diseases that cause  inflammation of blood vessels or cause blood vessel spasms.  Health behaviors and health history that increase your risk of developing PVD. What increases the risk? You are more likely to develop this condition if:  You have a family history of PVD.  You have certain medical conditions, including: ? High cholesterol. ? Diabetes. ? High blood pressure (hypertension). ? Coronary heart disease. ? Past problems with blood clots. ? Past injury, such as burns or a broken bone. These may have damaged blood vessels in your limbs. ? Buerger disease. This is caused by inflamed blood vessels in your hands and feet. ? Some forms of arthritis. ? Rare birth defects that affect the arteries in your legs. ? Kidney disease.  You use tobacco or smoke.  You do not get enough exercise.  You are obese.  You are age 22 or older. What are the signs or symptoms? This condition may cause different symptoms. Your symptoms depend on what part of your body is not getting enough blood. Some common signs and symptoms include:  Cramps in your lower legs. This may be a symptom of poor leg circulation (claudication).  Pain and weakness in your legs. This happens while you are physically active but goes away when you rest (intermittent claudication).  Leg pain when at rest.  Leg numbness, tingling, or weakness.  Coldness in a leg or foot, especially when compared with the other leg.  Skin or hair changes.  These can include: ? Hair loss. ? Shiny skin. ? Pale or bluish skin. ? Thick toenails.  Inability to get or maintain an erection (erectile dysfunction).  Fatigue. People with PVD are more likely to develop ulcers and sores on their toes, feet, or legs. These may take longer than normal to heal. How is this diagnosed? This condition is diagnosed based on:  Your signs and symptoms.  A physical exam and your medical history.  Other tests to find out what is causing your PVD and to determine its  severity. Tests may include: ? Blood pressure recordings from your arms and legs and measurements of the strength of your pulses (pulse volume recordings). ? Imaging studies using sound waves to take pictures of the blood flow through your blood vessels (Doppler ultrasound). ? Injecting a dye into your blood vessels before having imaging studies using:  X-rays (angiogram or arteriogram).  Computer-generated X-rays (CT angiogram).  A powerful electromagnetic field and a computer (magnetic resonance angiogram or MRA). How is this treated? Treatment for PVD depends on the cause of your condition and how severe your symptoms are. It also depends on your age. Underlying causes need to be treated and controlled. These include long-term (chronic) conditions, such as diabetes, high cholesterol, and high blood pressure. Treatment includes:  Lifestyle changes, such as: ? Quitting smoking. ? Exercising regularly. ? Following a low-fat, low-cholesterol diet.  Taking medicines, such as: ? Blood thinners to prevent blood clots. ? Medicines to improve blood flow. ? Medicines to improve your blood cholesterol levels.  Surgical procedures, such as: ? A procedure that uses an inflated balloon to open a blocked artery and improve blood flow (angioplasty). ? A procedure to put in a wire mesh tube to keep a blocked artery open (stent implant). ? Surgery to reroute blood flow around a blocked artery (peripheral bypass surgery). ? Surgery to remove dead tissue from an infected wound on the affected limb. ? Amputation. This is surgical removal of the affected limb. It may be necessary in cases of acute limb ischemia where there has been no improvement through medical or surgical treatments. Follow these instructions at home: Lifestyle  Do not use any products that contain nicotine or tobacco, such as cigarettes and e-cigarettes. If you need help quitting, ask your health care provider.  Lose weight if you  are overweight, and maintain a healthy weight as discussed by your health care provider.  Eat a diet that is low in fat and cholesterol. If you need help, ask your health care provider.  Exercise regularly. Ask your health care provider to suggest some good activities for you. General instructions  Take over-the-counter and prescription medicines only as told by your health care provider.  Take good care of your feet: ? Wear comfortable shoes that fit well. ? Check your feet often for any cuts or sores.  Keep all follow-up visits as told by your health care provider. This is important. Contact a health care provider if:  You have cramps in your legs while walking.  You have leg pain when you are at rest.  You have coldness in a leg or foot.  Your skin changes.  You have erectile dysfunction.  You have cuts or sores on your feet that are not healing. Get help right away if:  Your arm or leg turns cold, numb, and blue.  Your arms or legs become red, warm, swollen, painful, or numb.  You have chest pain or trouble breathing.  You suddenly  have weakness in your face, arm, or leg.  You become very confused or lose the ability to speak.  You suddenly have a very bad headache or lose your vision. Summary  Peripheral vascular disease (PVD) is a disease of the blood vessels.  In most cases, PVD narrows the blood vessels that carry blood from your heart to the rest of your body.  PVD may cause different symptoms. Your symptoms depend on what part of your body is not getting enough blood.  Treatment for PVD depends on the cause of your condition and how severe your symptoms are. This information is not intended to replace advice given to you by your health care provider. Make sure you discuss any questions you have with your health care provider. Document Released: 06/03/2004 Document Revised: 04/08/2017 Document Reviewed: 06/03/2016 Elsevier Patient Education  2020 [employer].

## 2018-12-05 NOTE — Discharge Summary (Signed)
Physician Discharge Summary  Melissa Cochran:[id number] DOB: 09-Jun-1935 DOA: 12/04/2018  PCP: [name], MD  Admit date: 12/04/2018 Discharge date: 12/05/2018  [facility]  Recommendations for Outpatient Follow-up:  1. Follow up with PCP in 1-2 weeks 2. Please obtain BMP/CBC in one week 3. Please follow up on the following pending results:  Home Health: None Equipment/Devices: None  Discharge Condition: Fair/stable CODE STATUS: Full code Diet recommendation: N.p.o./on tube feedings  Subjective: Patient seen and examined.  Remains nonverbal.  Eyes closed throughout my encounter.  This is her baseline.  Brief/Interim Summary: Melissa Cochran is a 83 y.o. female with medical history significant of chronic atrial fibrillation on apixaban, hypertension, coronary artery disease status post stent in 2229, chronic systolic heart failure with EF of 45 to 50% by echo on 06/28/2017, nephrolithiasis complicated by hydronephrosis status post stent placement in 2017, history of bilateral hydronephrosis, CVA, seizure disorder complicated by chronic respiratory failure with ventilator dependence [facility].Her baseline is able to open eyes and at this time she is unable to do that.    She has had 4 prior hospitalizations in 2020: 7/9-89 - sepsis of uncertain etiology, possibly tracheobronchitis vs. PNA 4/4-17 - B PNA with severe sepsis 5/19-27 - proteus UTI with possible VAP and possible celluitis 7/4-22 - septic shock due to Klebsiella bacteremia and UTI   She was evaluated in the ED and was found to have left ischemic great toe.  Vascular surgery was consulted however patient was admitted under hospitalist service.  She was evaluated by vascular surgery  and their assessment and plan is being copy pasted as below .  "ASSESSMENT:  Evidence of PAD but wounds are all  decubitus in nature.  Left heel wound is extensive and probably will not heal other wounds are fairly superficial She is not a candidate for any revascularization procedures due to baseline non ambulatory debilitated state. She does not seem to have pain or sepsis from this so I would not proceed with palliative above knee amputations  PLAN:  No vascular surgical intervention planned at this point"  Since there is no plan for intervention and patient is at her baseline so she is going to be discharged [facility] to [facility] today.  Discharge Diagnoses:  Principal Problem:   Critical lower limb ischemia Active Problems:   Seizures (HCC)   A-fib (HCC)   Chronic respiratory failure requiring continuous mechanical ventilation through tracheostomy ([place])   Tracheostomy dependence ([place])   Permanent vegetative state ([place])   Ischemic toe    Discharge Instructions  Discharge Instructions    Discharge patient   Complete by: As directed    Discharge disposition: [facility] to [facility] with planned acute hosp IP readmission   Discharge patient date: 12/05/2018     Allergies as of 12/05/2018   No Known Allergies     Medication List    TAKE these medications   acetaminophen 325 MG tablet Commonly known as: TYLENOL Place 650 mg into feeding tube every 4 (four) hours as needed for mild pain.   aspirin 81 MG chewable tablet Place 1 tablet (81 mg total) into feeding tube daily.   chlorhexidine 0.12 % solution Commonly known as: PERIDEX Use as directed 15 mLs in the mouth or throat See admin instructions. 15 ml's by starting shift  times a day   CRITIC-AID EX Apply 1 application topically daily. Apply to buttocks  diphenoxylate-atropine 2.5-0.025 MG tablet Commonly known as: LOMOTIL Place 1 tablet into feeding tube 4 (four) times daily as needed for diarrhea or loose stools.   enoxaparin 40 MG/0.4ML injection Commonly known as: LOVENOX Inject 0.4 mLs (40 mg total) into the skin  daily.   famotidine 20 MG tablet Commonly known as: PEPCID Place 20 mg into feeding tube daily.   feeding supplement (PRO-STAT SUGAR FREE 64) Liqd Place 30 mLs into feeding tube 3 (three) times daily.   free water Soln Place 300 mLs into feeding tube every 4 (four) hours.   furosemide 10 MG/ML injection Commonly known as: LASIX Inject 40 mg into the vein once.   HYDROcodone-acetaminophen 5-325 MG tablet Commonly known as: NORCO/VICODIN Place 1 tablet into feeding tube every 6 (six) hours as needed for moderate pain.   Isosource 1.5 Cal Liqd 300 mLs by Enteral route continuous. 50 ml/hr with  1000 ml   levETIRAcetam 100 MG/ML solution Commonly known as: KEPPRA Place 10 mLs (1,000 mg total) into feeding tube 2 (two) times daily. What changed: when to take this   midodrine 5 MG tablet Commonly known as: PROAMATINE Take 1 tablet (5 mg total) by mouth 3 (three) times daily with meals. What changed: how to take this   multivitamin Liqd Place 15 mLs into feeding tube daily. What changed: additional instructions   NORMAL SALINE FLUSH IV Inject 10 mLs into the vein See admin instructions. Use 10 ml's via IV every shift for any used lumens- flush each unused lumen   nystatin 100000 UNIT/ML suspension Commonly known as: MYCOSTATIN Take 5 mLs (500,000 Units total) by mouth 4 (four) times daily. What changed:   how to take this  when to take this   pantoprazole sodium 40 mg/20 mL Pack Commonly known as: PROTONIX Place 20 mLs (40 mg total) into feeding tube daily.   phenytoin 125 MG/5ML suspension Commonly known as: DILANTIN Place 500 mg into feeding tube every 12 (twelve) hours.   Valproate Sodium 250 MG/5ML Soln solution Commonly known as: DEPAKENE Place 5 mLs (250 mg total) into feeding tube 2 (two) times daily. What changed: when to take this      Follow-up Information    [name], MD Follow up in 1 week(s).   Specialty: Family Medicine Contact  information: [address] [place] [address] [phone]          No Known Allergies  Consultations: PCCM and vascular surgery   Procedures/Studies: Ct Abdomen Pelvis Wo Contrast  Result Date: 11/12/2018 CLINICAL DATA:  Inpatient. Sepsis. History of CVA, CHF, COPD, bladder tumor. EXAM: CT CHEST, ABDOMEN AND PELVIS WITHOUT CONTRAST TECHNIQUE: Multidetector CT imaging of the chest, abdomen and pelvis was performed following the standard protocol without IV contrast. COMPARISON:  Abdominal radiograph from earlier today. Chest radiograph from one day prior. 09/27/2018 CT chest, abdomen and pelvis. FINDINGS: CT CHEST FINDINGS Cardiovascular: Normal heart size. No significant pericardial effusion/thickening. Three-vessel coronary atherosclerosis. Left PICC terminates in the middle third of the SVC. Atherosclerotic nonaneurysmal thoracic aorta. Normal caliber pulmonary arteries. Mediastinum/Nodes: Stable hypodense 1.9 cm left thyroid lobe nodule. Unremarkable esophagus. No axillary adenopathy. Stable mildly enlarged 1.0 cm right paratracheal (series 3/image 24) and 1.3 cm AP window (series 3/image 24) nodes. No discrete hilar adenopathy on this noncontrast scan. Lungs/Pleura: No pneumothorax. Trace dependent left pleural effusion with mild smooth left pleural thickening. No right pleural effusion. Tracheostomy tube tip is in the tracheal lumen 3.6 cm above the carina. Dense consolidation  in basilar lower lobes bilaterally with associated air bronchograms and some volume loss, similar. Extensive patchy tree-in-bud opacities and scattered centrilobular nodularity throughout both lungs with associated scattered mild cylindrical and varicoid bronchiectasis throughout both lungs, not substantially changed in the interval. Representative 6 mm anterior right middle lobe nodule (series 4/image 87) is stable. No discrete lung masses. Musculoskeletal: No aggressive appearing focal osseous lesions.  Mild thoracic spondylosis. CT ABDOMEN PELVIS FINDINGS Hepatobiliary: Liver surface is finely irregular, cannot exclude hepatic cirrhosis. No liver mass. Normal gallbladder with no radiopaque cholelithiasis. No biliary ductal dilatation. Pancreas: Normal, with no mass or duct dilation. Spleen: Normal size. No mass. Adrenals/Urinary Tract: Normal adrenals. Nonobstructing bilateral nephrolithiasis with largest stone measuring 10 mm in the lower left kidney and 2 mm in interpolar right kidney. No contour deforming renal masses. No overt hydronephrosis. No ureteral stones. Bladder decompressed by indwelling Foley catheter. Expected gas in the nondependent bladder lumen from instrumentation. No definite bladder wall thickening. Stomach/Bowel: Percutaneous gastrostomy tube is in place within the body of the stomach. Postsurgical changes from gastrojejunostomy. Stomach is decompressed and otherwise appears normal. Normal caliber small bowel with no small bowel wall thickening. Appendix not discretely visualized. Mild sigmoid diverticulosis, with no definite large bowel wall thickening or significant pericolonic fat stranding. Scattered fluid levels in the large bowel. Rectal drainage tube in place. Vascular/Lymphatic: Atherosclerotic abdominal aorta with stable ectatic 2.6 cm infrarenal abdominal aorta. No pathologically enlarged lymph nodes in the abdomen or pelvis. Reproductive: Status post hysterectomy, with no abnormal findings at the vaginal cuff. Stable 3.7 cm right adnexal cyst (series 3/image 103). Stable multilocular 9.0 x 6.5 cm left adnexal cystic mass (series 3/image 111). Other: No pneumoperitoneum, ascites or focal fluid collection. Scattered trace ill-defined free fluid in the bilateral retroperitoneal posterior paranephric spaces. Musculoskeletal: No aggressive appearing focal osseous lesions. Left hip arthroplasty. Marked lumbar spondylosis. IMPRESSION: 1. Extensive patchy tree-in-bud opacities and  scattered centrilobular nodularity throughout both lungs with associated scattered mild bronchiectasis. No appreciable interval change since 09/27/2018 CT. Differential includes recurrent aspiration and/or atypical mycobacterial infection (MAI). 2. Dense consolidation with air bronchograms at the lung bases bilaterally, unchanged, favor atelectasis and/or postinfectious/postinflammatory scarring. 3. Well-positioned tracheostomy tube, left PICC and percutaneous gastrostomy and rectal tubes. 4. Mild mediastinal lymphadenopathy is stable and probably reactive. 5. Trace dependent left pleural effusion. 6. No evidence of bowel obstruction. Fluid levels scattered in the large bowel, indicative of a nonspecific diarrheal/malabsorptive state. Mild sigmoid diverticulosis, with no evidence of acute diverticulitis. 7. Mildly irregular liver surface, cannot exclude cirrhosis. 8. Stable multilocular 9.0 cm left adnexal cystic mass, cannot exclude left ovarian neoplasm. 9. Nonobstructive bilateral nephrolithiasis. 10.  Aortic Atherosclerosis (ICD10-I70.0). Electronically Signed   By: Ilona Sorrel M.D.   On: 11/12/2018 12:14   Dg Abdomen 1 View  Result Date: 11/12/2018 CLINICAL DATA:  83 y/o  F; evaluate for obstruction. EXAM: ABDOMEN - 1 VIEW COMPARISON:  09/27/2018 CT abdomen and pelvis FINDINGS: Peg tube tip projects over the left upper quadrant. There are left upper quadrant surgical sutures and staples. Moderate rotatory levocurvature of the lumbar spine. Abdominal aortic calcific atherosclerosis. Bowel gas pattern is normal. Left hip prosthesis, partially visualized. IMPRESSION: Normal bowel gas pattern. Electronically Signed   By: Kristine Garbe M.D.   On: 11/12/2018 06:13   Ct Chest Wo Contrast  Result Date: 11/12/2018 CLINICAL DATA:  Inpatient. Sepsis. History of CVA, CHF, COPD, bladder tumor. EXAM: CT CHEST, ABDOMEN AND PELVIS WITHOUT CONTRAST TECHNIQUE: Multidetector CT imaging of the chest,  abdomen and  pelvis was performed following the standard protocol without IV contrast. COMPARISON:  Abdominal radiograph from earlier today. Chest radiograph from one day prior. 09/27/2018 CT chest, abdomen and pelvis. FINDINGS: CT CHEST FINDINGS Cardiovascular: Normal heart size. No significant pericardial effusion/thickening. Three-vessel coronary atherosclerosis. Left PICC terminates in the middle third of the SVC. Atherosclerotic nonaneurysmal thoracic aorta. Normal caliber pulmonary arteries. Mediastinum/Nodes: Stable hypodense 1.9 cm left thyroid lobe nodule. Unremarkable esophagus. No axillary adenopathy. Stable mildly enlarged 1.0 cm right paratracheal (series 3/image 24) and 1.3 cm AP window (series 3/image 24) nodes. No discrete hilar adenopathy on this noncontrast scan. Lungs/Pleura: No pneumothorax. Trace dependent left pleural effusion with mild smooth left pleural thickening. No right pleural effusion. Tracheostomy tube tip is in the tracheal lumen 3.6 cm above the carina. Dense consolidation in basilar lower lobes bilaterally with associated air bronchograms and some volume loss, similar. Extensive patchy tree-in-bud opacities and scattered centrilobular nodularity throughout both lungs with associated scattered mild cylindrical and varicoid bronchiectasis throughout both lungs, not substantially changed in the interval. Representative 6 mm anterior right middle lobe nodule (series 4/image 87) is stable. No discrete lung masses. Musculoskeletal: No aggressive appearing focal osseous lesions. Mild thoracic spondylosis. CT ABDOMEN PELVIS FINDINGS Hepatobiliary: Liver surface is finely irregular, cannot exclude hepatic cirrhosis. No liver mass. Normal gallbladder with no radiopaque cholelithiasis. No biliary ductal dilatation. Pancreas: Normal, with no mass or duct dilation. Spleen: Normal size. No mass. Adrenals/Urinary Tract: Normal adrenals. Nonobstructing bilateral nephrolithiasis with largest stone measuring 10  mm in the lower left kidney and 2 mm in interpolar right kidney. No contour deforming renal masses. No overt hydronephrosis. No ureteral stones. Bladder decompressed by indwelling Foley catheter. Expected gas in the nondependent bladder lumen from instrumentation. No definite bladder wall thickening. Stomach/Bowel: Percutaneous gastrostomy tube is in place within the body of the stomach. Postsurgical changes from gastrojejunostomy. Stomach is decompressed and otherwise appears normal. Normal caliber small bowel with no small bowel wall thickening. Appendix not discretely visualized. Mild sigmoid diverticulosis, with no definite large bowel wall thickening or significant pericolonic fat stranding. Scattered fluid levels in the large bowel. Rectal drainage tube in place. Vascular/Lymphatic: Atherosclerotic abdominal aorta with stable ectatic 2.6 cm infrarenal abdominal aorta. No pathologically enlarged lymph nodes in the abdomen or pelvis. Reproductive: Status post hysterectomy, with no abnormal findings at the vaginal cuff. Stable 3.7 cm right adnexal cyst (series 3/image 103). Stable multilocular 9.0 x 6.5 cm left adnexal cystic mass (series 3/image 111). Other: No pneumoperitoneum, ascites or focal fluid collection. Scattered trace ill-defined free fluid in the bilateral retroperitoneal posterior paranephric spaces. Musculoskeletal: No aggressive appearing focal osseous lesions. Left hip arthroplasty. Marked lumbar spondylosis. IMPRESSION: 1. Extensive patchy tree-in-bud opacities and scattered centrilobular nodularity throughout both lungs with associated scattered mild bronchiectasis. No appreciable interval change since 09/27/2018 CT. Differential includes recurrent aspiration and/or atypical mycobacterial infection (MAI). 2. Dense consolidation with air bronchograms at the lung bases bilaterally, unchanged, favor atelectasis and/or postinfectious/postinflammatory scarring. 3. Well-positioned tracheostomy tube,  left PICC and percutaneous gastrostomy and rectal tubes. 4. Mild mediastinal lymphadenopathy is stable and probably reactive. 5. Trace dependent left pleural effusion. 6. No evidence of bowel obstruction. Fluid levels scattered in the large bowel, indicative of a nonspecific diarrheal/malabsorptive state. Mild sigmoid diverticulosis, with no evidence of acute diverticulitis. 7. Mildly irregular liver surface, cannot exclude cirrhosis. 8. Stable multilocular 9.0 cm left adnexal cystic mass, cannot exclude left ovarian neoplasm. 9. Nonobstructive bilateral nephrolithiasis. 10.  Aortic Atherosclerosis (ICD10-I70.0). Electronically Signed  By: Ilona Sorrel M.D.   On: 11/12/2018 12:14   Dg Chest Port 1 View  Result Date: 12/04/2018 CLINICAL DATA:  Recent infection.  Tracheostomy. EXAM: PORTABLE CHEST 1 VIEW COMPARISON:  11/19/2018 FINDINGS: Tracheostomy tube unchanged in positioning. Left-sided PICC line with distal tip at the superior cavoatrial junction. Cardiomediastinal silhouette is stable in size. Calcific aortic knob. Mild diffuse bilateral interstitial opacities with more confluent airspace opacities within the bilateral lower lobes, left worse than right. Findings appear similar the previous study. No pneumothorax. IMPRESSION: Similar patchy bilateral airspace opacities, most pronounced within the lung bases, left greater than right. These findings may represent atelectasis and/or infection. Electronically Signed   By: Davina Poke M.D.   On: 12/04/2018 11:14   Dg Chest Port 1 View  Result Date: 11/19/2018 CLINICAL DATA:  Fever and chills. EXAM: PORTABLE CHEST 1 VIEW COMPARISON:  November 11, 2018 FINDINGS: Tracheostomy tube in stable position. Cardiomediastinal silhouette is normal. Mediastinal contours appear intact. Calcific atherosclerotic disease of the aorta. Mild peribronchial airspace opacities in bilateral lower lobes, left greater than right. Osseous structures are without acute abnormality.  Soft tissues are grossly normal. IMPRESSION: Mild peribronchial airspace opacities in bilateral lower lobes, left greater than right. Nonspecific findings which may represent atelectasis or multifocal airspace disease. Electronically Signed   By: Fidela Salisbury M.D.   On: 11/19/2018 15:22   Dg Chest Port 1 View  Result Date: 11/11/2018 CLINICAL DATA:  Fever. EXAM: PORTABLE CHEST 1 VIEW COMPARISON:  Most recent radiograph 10/02/2018. Most recent CT 09/27/2018 FINDINGS: Tracheostomy tube tip 4 cm from the carina. Left upper extremity PICC in the mid SVC. Improvement in bilateral multifocal airspace disease with mild residual streaky opacities at the bases. Unchanged heart size and mediastinal contours. Aortic atherosclerosis. Mitral annulus calcifications. Prior right chest tube is been removed, no visualized pneumothorax. No large pleural effusion. IMPRESSION: 1. Improvement in bilateral multifocal airspace disease over the past 5 weeks with mild residual streaky opacities at the bases, likely atelectasis or scarring. 2. Tracheostomy tube and left upper extremity PICC remain in place. Electronically Signed   By: Keith Rake M.D.   On: 11/11/2018 23:25   [place] Ekg Site Rite  Result Date: 11/24/2018 If Site Rite image not attached, placement could not be confirmed due to current cardiac rhythm.    Discharge Exam: Vitals:   12/05/18 1300 12/05/18 1400  BP: 91/61 107/66  Pulse: 80 81  Resp: 20 20  Temp:    SpO2: 100% 100%   Vitals:   12/05/18 1200 12/05/18 1229 12/05/18 1300 12/05/18 1400  BP: (!) 89/49 (!) 115/57 91/61 107/66  Pulse: 85 90 80 81  Resp: 20 20 20 20   Temp:      TempSrc:      SpO2: 100% 100% 100% 100%  Weight:      Height:        General: On vent, trach Cardiovascular: RRR, S1/S2 +, no rubs, no gallops Respiratory: CTA bilaterally, no wheezing, no rhonchi Abdominal: Soft, NT, ND, bowel sounds + Extremities: no edema, no cyanosis    The results of significant  diagnostics from this hospitalization (including imaging, microbiology, ancillary and laboratory) are listed below for reference.     Microbiology: Recent Results (from the past 240 hour(s))  SARS Coronavirus 2 (CEPHEID - Performed in [place] hospital lab), Hosp Order     Status: None   Collection Time: 11/27/18  9:05 AM   Specimen: Nasopharyngeal Swab  Result Value Ref Range Status  SARS Coronavirus 2 NEGATIVE NEGATIVE Final    Comment: (NOTE) If result is NEGATIVE SARS-CoV-2 target nucleic acids are NOT DETECTED. The SARS-CoV-2 RNA is generally detectable in upper and lower  respiratory specimens during the acute phase of infection. The lowest  concentration of SARS-CoV-2 viral copies this assay can detect is 250  copies / mL. A negative result does not preclude SARS-CoV-2 infection  and should not be used as the sole basis for treatment or other  patient management decisions.  A negative result may occur with  improper specimen collection / handling, submission of specimen other  than nasopharyngeal swab, presence of viral mutation(s) within the  areas targeted by this assay, and inadequate number of viral copies  (<250 copies / mL). A negative result must be combined with clinical  observations, patient history, and epidemiological information. If result is POSITIVE SARS-CoV-2 target nucleic acids are DETECTED. The SARS-CoV-2 RNA is generally detectable in upper and lower  respiratory specimens dur ing the acute phase of infection.  Positive  results are indicative of active infection with SARS-CoV-2.  Clinical  correlation with patient history and other diagnostic information is  necessary to determine patient infection status.  Positive results do  not rule out bacterial infection or co-infection with other viruses. If result is PRESUMPTIVE POSTIVE SARS-CoV-2 nucleic acids MAY BE PRESENT.   A presumptive positive result was obtained on the submitted specimen  and  confirmed on repeat testing.  While 2019 novel coronavirus  (SARS-CoV-2) nucleic acids may be present in the submitted sample  additional confirmatory testing may be necessary for epidemiological  and / or clinical management purposes  to differentiate between  SARS-CoV-2 and other Sarbecovirus currently known to infect humans.  If clinically indicated additional testing with an alternate test  methodology [phone]) is advised. The SARS-CoV-2 RNA is generally  detectable in upper and lower respiratory sp ecimens during the acute  phase of infection. The expected result is Negative. Fact Sheet for Patients:  StrictlyIdeas.no Fact Sheet for Healthcare Providers: BankingDealers.co.za This test is not yet approved or cleared by the [place] FDA and has been authorized for detection and/or diagnosis of SARS-CoV-2 by FDA under an Emergency Use Authorization (EUA).  This EUA will remain in effect (meaning this test can be used) for the duration of the COVID-19 declaration under Section 564(b)(1) of the Act, 21 U.S.C. section 360bbb-3(b)(1), unless the authorization is terminated or revoked sooner. Performed at [facility], [address] [place], [place] [address]   SARS Coronavirus 2 (CEPHEID - Performed in [place] hospital lab), Hosp Order     Status: None   Collection Time: 12/04/18 10:25 AM   Specimen: Nasopharyngeal Swab  Result Value Ref Range Status   SARS Coronavirus 2 NEGATIVE NEGATIVE Final    Comment: (NOTE) If result is NEGATIVE SARS-CoV-2 target nucleic acids are NOT DETECTED. The SARS-CoV-2 RNA is generally detectable in upper and lower  respiratory specimens during the acute phase of infection. The lowest  concentration of SARS-CoV-2 viral copies this assay can detect is 250  copies / mL. A negative result does not preclude SARS-CoV-2 infection  and should not be used as the sole basis for treatment or other   patient management decisions.  A negative result may occur with  improper specimen collection / handling, submission of specimen other  than nasopharyngeal swab, presence of viral mutation(s) within the  areas targeted by this assay, and inadequate number of viral copies  (<250 copies /  mL). A negative result must be combined with clinical  observations, patient history, and epidemiological information. If result is POSITIVE SARS-CoV-2 target nucleic acids are DETECTED. The SARS-CoV-2 RNA is generally detectable in upper and lower  respiratory specimens dur ing the acute phase of infection.  Positive  results are indicative of active infection with SARS-CoV-2.  Clinical  correlation with patient history and other diagnostic information is  necessary to determine patient infection status.  Positive results do  not rule out bacterial infection or co-infection with other viruses. If result is PRESUMPTIVE POSTIVE SARS-CoV-2 nucleic acids MAY BE PRESENT.   A presumptive positive result was obtained on the submitted specimen  and confirmed on repeat testing.  While 2019 novel coronavirus  (SARS-CoV-2) nucleic acids may be present in the submitted sample  additional confirmatory testing may be necessary for epidemiological  and / or clinical management purposes  to differentiate between  SARS-CoV-2 and other Sarbecovirus currently known to infect humans.  If clinically indicated additional testing with an alternate test  methodology [phone]) is advised. The SARS-CoV-2 RNA is generally  detectable in upper and lower respiratory sp ecimens during the acute  phase of infection. The expected result is Negative. Fact Sheet for Patients:  StrictlyIdeas.no Fact Sheet for Healthcare Providers: BankingDealers.co.za This test is not yet approved or cleared by the [place] FDA and has been authorized for detection and/or diagnosis of SARS-CoV-2  by FDA under an Emergency Use Authorization (EUA).  This EUA will remain in effect (meaning this test can be used) for the duration of the COVID-19 declaration under Section 564(b)(1) of the Act, 21 U.S.C. section 360bbb-3(b)(1), unless the authorization is terminated or revoked sooner. Performed at [facility], [place] [address], [place], [place] [address]   Blood Cultures x 2 sites     Status: None (Preliminary result)   Collection Time: 12/04/18  1:29 PM   Specimen: BLOOD  Result Value Ref Range Status   Specimen Description BLOOD BLOOD RIGHT WRIST  Final   Special Requests   Final    BOTTLES DRAWN AEROBIC AND ANAEROBIC Blood Culture adequate volume   Culture   Final    NO GROWTH < 24 HOURS Performed at [facility], [place] [address], [place], [place] [address]    Report Status PENDING  Incomplete  Blood Cultures x 2 sites     Status: None (Preliminary result)   Collection Time: 12/04/18  1:51 PM   Specimen: BLOOD LEFT HAND  Result Value Ref Range Status   Specimen Description BLOOD LEFT HAND  Final   Special Requests   Final    BOTTLES DRAWN AEROBIC ONLY Blood Culture results may not be optimal due to an inadequate volume of blood received in culture bottles   Culture   Final    NO GROWTH < 24 HOURS Performed at [facility], [place] [address], [place], [place] [address]    Report Status PENDING  Incomplete  MRSA PCR Screening     Status: None   Collection Time: 12/04/18  5:17 PM   Specimen: Nasal Mucosa; Nasopharyngeal  Result Value Ref Range Status   MRSA by PCR NEGATIVE NEGATIVE Final    Comment:        The GeneXpert MRSA Assay (FDA approved for NASAL specimens only), is one component of a comprehensive MRSA colonization surveillance program. It is not intended to diagnose MRSA infection nor to guide or monitor treatment for MRSA infections. Performed at [facility], [address] [place], [place] [address]      Labs: BNP (last 3  results) No results for input(s): BNP in the last 8760 hours. Basic Metabolic Panel: Recent Labs  Lab 11/29/18 0614 12/04/18 1019 12/05/18 0417  NA 141 139 138  K 4.3 4.7 4.0  CL 114* 105 104  CO2 20* 26 25  GLUCOSE 244* 90 76  BUN 62* 19 18  CREATININE 0.56 0.52 0.51  CALCIUM 7.9* 8.5* 8.5*   Liver Function Tests: No results for input(s): AST, ALT, ALKPHOS, BILITOT, PROT, ALBUMIN in the last 168 hours. No results for input(s): LIPASE, AMYLASE in the last 168 hours. No results for input(s): AMMONIA in the last 168 hours. CBC: Recent Labs  Lab 11/29/18 0614 12/04/18 1019 12/05/18 0417  WBC 10.0 8.3 6.9  NEUTROABS 6.5 5.4  --   HGB 8.3* 7.7* 7.5*  HCT 28.3* 27.5* 26.2*  MCV 102.9* 105.8* 105.2*  PLT 300 362 336   Cardiac Enzymes: No results for input(s): CKTOTAL, CKMB, CKMBINDEX, TROPONINI in the last 168 hours. BNP: Invalid input(s): POCBNP CBG: Recent Labs  Lab 11/28/18 2336 11/29/18 0344 11/29/18 0746 11/29/18 1133 12/04/18 1014  GLUCAP 104* 80 92 132* 94   D-Dimer No results for input(s): DDIMER in the last 72 hours. Hgb A1c No results for input(s): HGBA1C in the last 72 hours. Lipid Profile No results for input(s): CHOL, HDL, LDLCALC, TRIG, CHOLHDL, LDLDIRECT in the last 72 hours. Thyroid function studies No results for input(s): TSH, T4TOTAL, T3FREE, THYROIDAB in the last 72 hours.  Invalid input(s): FREET3 Anemia work up No results for input(s): VITAMINB12, FOLATE, FERRITIN, TIBC, IRON, RETICCTPCT in the last 72 hours. Urinalysis    Component Value Date/Time   COLORURINE YELLOW 11/11/2018 2220   APPEARANCEUR TURBID (A) 11/11/2018 2220   APPEARANCEUR Cloudy (A) 01/29/2016 1126   LABSPEC 1.020 11/11/2018 2220   LABSPEC 1.020 01/23/2014 1047   PHURINE 7.5 11/11/2018 2220   GLUCOSEU NEGATIVE 11/11/2018 2220   GLUCOSEU NEGATIVE 01/23/2014 1047   HGBUR LARGE (A) 11/11/2018 2220   BILIRUBINUR SMALL (A) 11/11/2018 2220   BILIRUBINUR Negative  01/13/2016 1110   BILIRUBINUR NEGATIVE 01/23/2014 1047   KETONESUR NEGATIVE 11/11/2018 2220   PROTEINUR >300 (A) 11/11/2018 2220   UROBILINOGEN 0.2 08/04/2015 1140   NITRITE NEGATIVE 11/11/2018 2220   LEUKOCYTESUR MODERATE (A) 11/11/2018 2220   LEUKOCYTESUR TRACE 01/23/2014 1047   Sepsis Labs Invalid input(s): PROCALCITONIN,  WBC,  LACTICIDVEN Microbiology Recent Results (from the past 240 hour(s))  SARS Coronavirus 2 (CEPHEID - Performed in [facility] hospital lab), Hosp Order     Status: None   Collection Time: 11/27/18  9:05 AM   Specimen: Nasopharyngeal Swab  Result Value Ref Range Status   SARS Coronavirus 2 NEGATIVE NEGATIVE Final    Comment: (NOTE) If result is NEGATIVE SARS-CoV-2 target nucleic acids are NOT DETECTED. The SARS-CoV-2 RNA is generally detectable in upper and lower  respiratory specimens during the acute phase of infection. The lowest  concentration of SARS-CoV-2 viral copies this assay can detect is 250  copies / mL. A negative result does not preclude SARS-CoV-2 infection  and should not be used as the sole basis for treatment or other  patient management decisions.  A negative result may occur with  improper specimen collection / handling, submission of specimen other  than nasopharyngeal swab, presence of viral mutation(s) within the  areas targeted by this assay, and inadequate number of viral copies  (<250 copies / mL).  A negative result must be combined with clinical  observations, patient history, and epidemiological information. If result is POSITIVE SARS-CoV-2 target nucleic acids are DETECTED. The SARS-CoV-2 RNA is generally detectable in upper and lower  respiratory specimens dur ing the acute phase of infection.  Positive  results are indicative of active infection with SARS-CoV-2.  Clinical  correlation with patient history and other diagnostic information is  necessary to determine patient infection status.  Positive results do  not rule  out bacterial infection or co-infection with other viruses. If result is PRESUMPTIVE POSTIVE SARS-CoV-2 nucleic acids MAY BE PRESENT.   A presumptive positive result was obtained on the submitted specimen  and confirmed on repeat testing.  While 2019 novel coronavirus  (SARS-CoV-2) nucleic acids may be present in the submitted sample  additional confirmatory testing may be necessary for epidemiological  and / or clinical management purposes  to differentiate between  SARS-CoV-2 and other Sarbecovirus currently known to infect humans.  If clinically indicated additional testing with an alternate test  methodology [phone]) is advised. The SARS-CoV-2 RNA is generally  detectable in upper and lower respiratory sp ecimens during the acute  phase of infection. The expected result is Negative. Fact Sheet for Patients:  StrictlyIdeas.no Fact Sheet for Healthcare Providers: BankingDealers.co.za This test is not yet approved or cleared by the [place] FDA and has been authorized for detection and/or diagnosis of SARS-CoV-2 by FDA under an Emergency Use Authorization (EUA).  This EUA will remain in effect (meaning this test can be used) for the duration of the COVID-19 declaration under Section 564(b)(1) of the Act, 21 U.S.C. section 360bbb-3(b)(1), unless the authorization is terminated or revoked sooner. Performed at [facility], [place] [address] [place], [place] [address]   SARS Coronavirus 2 (CEPHEID - Performed in [facility] hospital lab), Hosp Order     Status: None   Collection Time: 12/04/18 10:25 AM   Specimen: Nasopharyngeal Swab  Result Value Ref Range Status   SARS Coronavirus 2 NEGATIVE NEGATIVE Final    Comment: (NOTE) If result is NEGATIVE SARS-CoV-2 target nucleic acids are NOT DETECTED. The SARS-CoV-2 RNA is generally detectable in upper and lower  respiratory specimens during the acute phase of infection. The  lowest  concentration of SARS-CoV-2 viral copies this assay can detect is 250  copies / mL. A negative result does not preclude SARS-CoV-2 infection  and should not be used as the sole basis for treatment or other  patient management decisions.  A negative result may occur with  improper specimen collection / handling, submission of specimen other  than nasopharyngeal swab, presence of viral mutation(s) within the  areas targeted by this assay, and inadequate number of viral copies  (<250 copies / mL). A negative result must be combined with clinical  observations, patient history, and epidemiological information. If result is POSITIVE SARS-CoV-2 target nucleic acids are DETECTED. The SARS-CoV-2 RNA is generally detectable in upper and lower  respiratory specimens dur ing the acute phase of infection.  Positive  results are indicative of active infection with SARS-CoV-2.  Clinical  correlation with patient history and other diagnostic information is  necessary to determine patient infection status.  Positive results do  not rule out bacterial infection or co-infection with other viruses. If result is PRESUMPTIVE POSTIVE SARS-CoV-2 nucleic acids MAY BE PRESENT.   A presumptive positive result was obtained on the submitted specimen  and confirmed on repeat testing.  While 2019 novel coronavirus  (SARS-CoV-2) nucleic acids  may be present in the submitted sample  additional confirmatory testing may be necessary for epidemiological  and / or clinical management purposes  to differentiate between  SARS-CoV-2 and other Sarbecovirus currently known to infect humans.  If clinically indicated additional testing with an alternate test  methodology [phone]) is advised. The SARS-CoV-2 RNA is generally  detectable in upper and lower respiratory sp ecimens during the acute  phase of infection. The expected result is Negative. Fact Sheet for Patients:   StrictlyIdeas.no Fact Sheet for Healthcare Providers: BankingDealers.co.za This test is not yet approved or cleared by the [place] FDA and has been authorized for detection and/or diagnosis of SARS-CoV-2 by FDA under an Emergency Use Authorization (EUA).  This EUA will remain in effect (meaning this test can be used) for the duration of the COVID-19 declaration under Section 564(b)(1) of the Act, 21 U.S.C. section 360bbb-3(b)(1), unless the authorization is terminated or revoked sooner. Performed at [facility], [place] [address], [place], [place] [address]   Blood Cultures x 2 sites     Status: None (Preliminary result)   Collection Time: 12/04/18  1:29 PM   Specimen: BLOOD  Result Value Ref Range Status   Specimen Description BLOOD BLOOD RIGHT WRIST  Final   Special Requests   Final    BOTTLES DRAWN AEROBIC AND ANAEROBIC Blood Culture adequate volume   Culture   Final    NO GROWTH < 24 HOURS Performed at [facility], [place] [address] [place], [place] [address]    Report Status PENDING  Incomplete  Blood Cultures x 2 sites     Status: None (Preliminary result)   Collection Time: 12/04/18  1:51 PM   Specimen: BLOOD LEFT HAND  Result Value Ref Range Status   Specimen Description BLOOD LEFT HAND  Final   Special Requests   Final    BOTTLES DRAWN AEROBIC ONLY Blood Culture results may not be optimal due to an inadequate volume of blood received in culture bottles   Culture   Final    NO GROWTH < 24 HOURS Performed at [facility], [place] [address], [place], [place] [address]    Report Status PENDING  Incomplete  MRSA PCR Screening     Status: None   Collection Time: 12/04/18  5:17 PM   Specimen: Nasal Mucosa; Nasopharyngeal  Result Value Ref Range Status   MRSA by PCR NEGATIVE NEGATIVE Final    Comment:        The GeneXpert MRSA Assay (FDA approved for NASAL specimens only), is one component of  a comprehensive MRSA colonization surveillance program. It is not intended to diagnose MRSA infection nor to guide or monitor treatment for MRSA infections. Performed at [facility], [place] [address] [place], [place] [address]      Time coordinating discharge: 30 minutes  SIGNED:   Darliss Cheney, MD  [facility] 12/05/2018, 2:24 PM Pager [phone]  If 7PM-7AM, please contact night-coverage [email or web address] Password [email or web address]

## 2018-12-05 NOTE — Progress Notes (Signed)
Report given to RN at [facility]. They stated it was ok to leave in PICC line and flexiseal. She came to [facility] with picc line in place initially.  Joellen Jersey, RN

## 2018-12-05 NOTE — Care Management CC44 (Signed)
Condition Code 44 Documentation Completed  Patient Details  Name: Melissa Cochran MRN: [id number] Date of Birth: May 09, 1936   Condition Code 44 given:  Yes Patient signature on Condition Code 44 notice:  Yes Documentation of 2 MD's agreement:  Yes Code 44 added to claim:  Yes    Bethena Roys, RN 12/05/2018, 2:17 PM

## 2018-12-05 NOTE — Progress Notes (Signed)
eLink Physician-Brief Progress Note Patient Name: Melissa Cochran DOB: 04-14-1936 MRN: [id number]   Date of Service  12/05/2018  HPI/Events of Note  Hypotension with BP 82/39  eICU Interventions  ALBUMIN 5 % 12.5 GM IV X 1        Kadance Mccuistion U Demetres Prochnow 12/05/2018, 12:14 AM

## 2018-12-05 NOTE — NC FL2 (Signed)
[place] LEVEL OF CARE SCREENING TOOL     IDENTIFICATION  Patient Name: Melissa Cochran Birthdate: March 24, 1936 Sex: female Admission Date (Current Location): 12/04/2018  [facility] and [place] Number:  Herbalist and Address:  [facility], [place] [address], [place], [place] [address]      Provider Number: [id number]  Attending Physician Name and Address:  Darliss Cheney, MD  Relative Name and Phone Number:       Current Level of Care: Hospital Recommended Level of Care: [place] Prior Approval Number:    Date Approved/Denied:   PASRR Number:    Discharge Plan: SNF    Current Diagnoses: Patient Active Problem List   Diagnosis Date Noted  . Ischemic toe 12/05/2018  . Critical lower limb ischemia 12/04/2018  . Tracheostomy dependence ([place]) 12/04/2018  . Permanent vegetative state ([place]) 12/04/2018  . Pressure injury of skin 11/20/2018  . Fever 11/20/2018  . Bacteremia due to Klebsiella pneumoniae 11/13/2018  . UTI (urinary tract infection) 11/13/2018  . Sepsis ([place]) 11/12/2018  . Goals of care, counseling/discussion   . Palliative care by specialist   . Meningioma, cerebral ([place]) 09/27/2018  . Other emphysema ([place]) 09/27/2018  . Febrile illness, acute   . Severe sepsis with septic shock ([place]) 09/26/2018  . Hypernatremia 09/26/2018  . AKI (acute kidney injury) ([place]) 09/26/2018  . CVA (cerebral vascular accident) ([place]) 04/19/2018  . Chronic respiratory failure requiring continuous mechanical ventilation through tracheostomy ([place]) 04/19/2018  . E-coli UTI, ESBL  06/30/2017  . A-fib ([place]) 06/30/2017  . Need for protective airway ventilation 06/30/2017  . Seizures ([place]) 06/26/2017  . Pressure ulcer 02/17/2017  . Bilateral lower extremity edema 10/08/2016  . Venous ulcer of ankle ([place]) 07/08/2016  . DVT (deep venous thrombosis) ([place]) 07/02/2016  . S/P coronary artery stent placement 10/30/2015  .  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 Mass of lower lobe of left lung, incidetnal on CT 09/2015, smoker.  09/29/2015  . Other microscopic hematuria 08/26/2015  . Urge incontinence 08/26/2015  . Essential (primary) hypertension 12/09/2014  . Adnexal mass 11/28/2013  . History of cardiac catheterization 11/28/2013  . Hyperlipidemia 11/28/2013    Orientation RESPIRATION BLADDER Height & Weight     (unable to assess, pt is intubated)  Vent, Tracheostomy External catheter, Incontinent Weight: 184 lb 15.5 oz (83.9 kg) Height:  '5\' 7"'$  (170.2 cm)  BEHAVIORAL SYMPTOMS/MOOD NEUROLOGICAL BOWEL NUTRITION STATUS      Incontinent Feeding tube  AMBULATORY STATUS COMMUNICATION OF NEEDS Skin   Total Care Does not communicate PU Stage and Appropriate Care(unstagable on heel. unstagable on foot, foam dressing)     PU Stage 3 Dressing: (located on sacrum, foam dressing, change every 3 days.Located on vertebral, foam dressing, change every 3 days.)                 Personal Care Assistance Level of Assistance  Bathing, Feeding, Dressing Bathing Assistance: Maximum assistance Feeding assistance: Maximum assistance Dressing Assistance: Maximum assistance Total Care Assistance: Maximum assistance   Functional Limitations Info  Hearing, Sight, Speech Sight Info: Adequate Hearing Info: Adequate Speech Info: Impaired    SPECIAL CARE FACTORS FREQUENCY  PT (By licensed PT), OT (By licensed OT)                    Contractures Contractures Info: Not present    Additional Factors Info  Code Status, Allergies, Isolation Precautions Code Status Info:  Full Code Allergies Info: NO known allergies     Isolation Precautions Info: Contact precautions     Current Medications (12/05/2018):  This is the current hospital active medication list Current Facility-Administered Medications  Medication Dose Route Frequency Provider Last Rate Last Dose  . acetaminophen (TYLENOL)  tablet 650 mg  650 mg Oral Q6H PRN Karmen Bongo, MD       Or  . acetaminophen (TYLENOL) suppository 650 mg  650 mg Rectal Q6H PRN Karmen Bongo, MD      . albuterol (PROVENTIL) (2.5 MG/3ML) 0.083% nebulizer solution 2.5 mg  2.5 mg Nebulization Q6H PRN Karmen Bongo, MD      . aspirin chewable tablet 81 mg  81 mg Per Tube Daily Karmen Bongo, MD   81 mg at 12/05/18 1054  . chlorhexidine gluconate (MEDLINE KIT) (PERIDEX) 0.12 % solution 15 mL  15 mL Mouth Rinse BID Karmen Bongo, MD   15 mL at 12/05/18 0750  . Chlorhexidine Gluconate Cloth 2 % PADS 6 each  6 each Topical Daily Karmen Bongo, MD   6 each at 12/04/18 1500  . diphenoxylate-atropine (LOMOTIL) 2.5-0.025 MG per tablet 1 tablet  1 tablet Per Tube QID PRN Karmen Bongo, MD      . famotidine (PEPCID) 40 MG/5ML suspension 20 mg  20 mg Per Tube Daily Karmen Bongo, MD   20 mg at 12/05/18 1054  . feeding supplement (JEVITY 1.2 CAL) liquid 1,000 mL  1,000 mL Per Tube Continuous Karmen Bongo, MD 50 mL/hr at 12/05/18 0800 1,000 mL at 12/05/18 0800  . feeding supplement (PRO-STAT SUGAR FREE 64) liquid 30 mL  30 mL Per Tube TID Karmen Bongo, MD   30 mL at 12/05/18 1054  . heparin injection 5,000 Units  5,000 Units Subcutaneous Lynne Logan, MD   5,000 Units at 12/05/18 0615  . HYDROcodone-acetaminophen (NORCO/VICODIN) 5-325 MG per tablet 1 tablet  1 tablet Per Tube Q6H PRN Karmen Bongo, MD      . levETIRAcetam (KEPPRA) 100 MG/ML solution 1,000 mg  1,000 mg Per Tube Lillia [place], MD   1,000 mg at 12/05/18 1056  . MEDLINE mouth rinse  15 mL Mouth Rinse 10 times per day Karmen Bongo, MD   15 mL at 12/05/18 1030  . midodrine (PROAMATINE) tablet 5 mg  5 mg Per Tube TID WC Karmen Bongo, MD   5 mg at 12/05/18 0754  . multivitamin liquid 15 mL  15 mL Per Tube Daily Karmen Bongo, MD   15 mL at 12/05/18 1054  . nystatin (MYCOSTATIN) 100000 UNIT/ML suspension 500,000 Units  5 mL Oral Q4H Karmen Bongo, MD    500,000 Units at 12/05/18 1054  . ondansetron (ZOFRAN) tablet 4 mg  4 mg Oral Q6H PRN Karmen Bongo, MD       Or  . ondansetron [facility]) injection 4 mg  4 mg Intravenous Q6H PRN Karmen Bongo, MD      . pantoprazole sodium (PROTONIX) 40 mg/20 mL oral suspension 40 mg  40 mg Per Tube Daily Karmen Bongo, MD   40 mg at 12/05/18 1054  . phenytoin (DILANTIN) 125 MG/5ML suspension 200 mg  200 mg Per Tube Q1400 Karmen Bongo, MD      . phenytoin (DILANTIN) 125 MG/5ML suspension 400 mg  400 mg Per Tube Lillia [place], MD   400 mg at 12/05/18 1055  . sodium chloride flush (NS) 0.9 % injection 10-40 mL  10-40 mL Intracatheter Q12H Karmen Bongo, MD   20 mL  at 12/05/18 1057  . sodium chloride flush (NS) 0.9 % injection 10-40 mL  10-40 mL Intracatheter PRN Karmen Bongo, MD      . valproic acid (DEPAKENE) solution 250 mg  250 mg Per Tube BID Karmen Bongo, MD   250 mg at 12/05/18 1054     Discharge Medications: Please see discharge summary for a list of discharge medications.  Relevant Imaging Results:  Relevant Lab Results:   Additional Information [id number]  Eileen Stanford, LCSW

## 2018-12-05 NOTE — Care Management Obs Status (Signed)
[place] NOTIFICATION   Patient Details  Name: Melissa Cochran MRN: [id number] Date of Birth: 1935/05/11   Medicare Observation Status Notification Given:  Yes    Bethena Roys, RN 12/05/2018, 2:16 PM

## 2018-12-05 NOTE — TOC Transition Note (Signed)
Transition of Care [facility]) - CM/SW Discharge Note   Patient Details  Name: Melissa Cochran MRN: [id number] Date of Birth: 01/15/1936  [facility] CM/SW Contact:  Vinie Sill, [place] Phone Number: 12/05/2018, 3:33 PM   Clinical Narrative:       Patient will DC to: [facility] Date: 12/05/2018 Family Notified: [name], daughter  Transport By: Karen Chafe at 4:30 pm  RN, patient, and facility notified of DC. Discharge Summary sent to facility. RN given number for report [phone].  Accepting attending [facility], Room 307. Carelink transport requested for patient.   Clinical Social Worker signing off. Thurmond Butts, MSW, [facility] Clinical Social Worker [phone]       Patient Goals and CMS Choice        Discharge Placement   Existing PASRR number confirmed : 12/05/18                Patient and family notified of of transfer: 12/05/18  Discharge Plan and Services                                     Social Determinants of Health (SDOH) Interventions     Readmission Risk Interventions No flowsheet data found.

## 2018-12-06 DIAGNOSIS — E119 Type 2 diabetes mellitus without complications: Secondary | ICD-10-CM | POA: Diagnosis not present

## 2018-12-06 DIAGNOSIS — J158 Pneumonia due to other specified bacteria: Secondary | ICD-10-CM | POA: Diagnosis not present

## 2018-12-06 DIAGNOSIS — Z1159 Encounter for screening for other viral diseases: Secondary | ICD-10-CM | POA: Diagnosis not present

## 2018-12-06 DIAGNOSIS — R7881 Bacteremia: Secondary | ICD-10-CM | POA: Diagnosis not present

## 2018-12-06 DIAGNOSIS — R6521 Severe sepsis with septic shock: Secondary | ICD-10-CM | POA: Diagnosis not present

## 2018-12-07 ENCOUNTER — Other Ambulatory Visit: Payer: Self-pay

## 2018-12-07 DIAGNOSIS — Z1159 Encounter for screening for other viral diseases: Secondary | ICD-10-CM | POA: Diagnosis not present

## 2018-12-07 DIAGNOSIS — E119 Type 2 diabetes mellitus without complications: Secondary | ICD-10-CM | POA: Diagnosis not present

## 2018-12-07 DIAGNOSIS — R6521 Severe sepsis with septic shock: Secondary | ICD-10-CM | POA: Diagnosis not present

## 2018-12-07 DIAGNOSIS — R918 Other nonspecific abnormal finding of lung field: Secondary | ICD-10-CM | POA: Diagnosis not present

## 2018-12-07 DIAGNOSIS — R7881 Bacteremia: Secondary | ICD-10-CM | POA: Diagnosis not present

## 2018-12-07 DIAGNOSIS — J158 Pneumonia due to other specified bacteria: Secondary | ICD-10-CM | POA: Diagnosis not present

## 2018-12-08 DIAGNOSIS — J9600 Acute respiratory failure, unspecified whether with hypoxia or hypercapnia: Secondary | ICD-10-CM | POA: Diagnosis not present

## 2018-12-09 LAB — CULTURE, BLOOD (ROUTINE X 2)
Culture: NO GROWTH
Culture: NO GROWTH
Special Requests: ADEQUATE

## 2018-12-12 DIAGNOSIS — I70262 Atherosclerosis of native arteries of extremities with gangrene, left leg: Secondary | ICD-10-CM | POA: Diagnosis not present

## 2018-12-12 DIAGNOSIS — J158 Pneumonia due to other specified bacteria: Secondary | ICD-10-CM | POA: Diagnosis not present

## 2018-12-12 DIAGNOSIS — Z9911 Dependence on respirator [ventilator] status: Secondary | ICD-10-CM | POA: Diagnosis not present

## 2018-12-12 DIAGNOSIS — E119 Type 2 diabetes mellitus without complications: Secondary | ICD-10-CM | POA: Diagnosis not present

## 2018-12-12 DIAGNOSIS — R7881 Bacteremia: Secondary | ICD-10-CM | POA: Diagnosis not present

## 2018-12-12 DIAGNOSIS — I4891 Unspecified atrial fibrillation: Secondary | ICD-10-CM | POA: Diagnosis not present

## 2018-12-12 DIAGNOSIS — Z1159 Encounter for screening for other viral diseases: Secondary | ICD-10-CM | POA: Diagnosis not present

## 2018-12-12 DIAGNOSIS — L89520 Pressure ulcer of left ankle, unstageable: Secondary | ICD-10-CM | POA: Diagnosis not present

## 2018-12-12 DIAGNOSIS — I10 Essential (primary) hypertension: Secondary | ICD-10-CM | POA: Diagnosis not present

## 2018-12-12 DIAGNOSIS — R6521 Severe sepsis with septic shock: Secondary | ICD-10-CM | POA: Diagnosis not present

## 2018-12-14 DIAGNOSIS — J158 Pneumonia due to other specified bacteria: Secondary | ICD-10-CM | POA: Diagnosis not present

## 2018-12-14 DIAGNOSIS — E119 Type 2 diabetes mellitus without complications: Secondary | ICD-10-CM | POA: Diagnosis not present

## 2018-12-14 DIAGNOSIS — L89520 Pressure ulcer of left ankle, unstageable: Secondary | ICD-10-CM | POA: Diagnosis not present

## 2018-12-14 DIAGNOSIS — R7881 Bacteremia: Secondary | ICD-10-CM | POA: Diagnosis not present

## 2018-12-14 DIAGNOSIS — Z1159 Encounter for screening for other viral diseases: Secondary | ICD-10-CM | POA: Diagnosis not present

## 2018-12-14 DIAGNOSIS — R6521 Severe sepsis with septic shock: Secondary | ICD-10-CM | POA: Diagnosis not present

## 2018-12-19 DIAGNOSIS — E119 Type 2 diabetes mellitus without complications: Secondary | ICD-10-CM | POA: Diagnosis not present

## 2018-12-19 DIAGNOSIS — R6521 Severe sepsis with septic shock: Secondary | ICD-10-CM | POA: Diagnosis not present

## 2018-12-19 DIAGNOSIS — L89520 Pressure ulcer of left ankle, unstageable: Secondary | ICD-10-CM | POA: Diagnosis not present

## 2018-12-19 DIAGNOSIS — J9600 Acute respiratory failure, unspecified whether with hypoxia or hypercapnia: Secondary | ICD-10-CM | POA: Diagnosis not present

## 2018-12-19 DIAGNOSIS — R7881 Bacteremia: Secondary | ICD-10-CM | POA: Diagnosis not present

## 2018-12-19 DIAGNOSIS — Z1159 Encounter for screening for other viral diseases: Secondary | ICD-10-CM | POA: Diagnosis not present

## 2018-12-19 DIAGNOSIS — J158 Pneumonia due to other specified bacteria: Secondary | ICD-10-CM | POA: Diagnosis not present

## 2018-12-20 DIAGNOSIS — J158 Pneumonia due to other specified bacteria: Secondary | ICD-10-CM | POA: Diagnosis not present

## 2018-12-20 DIAGNOSIS — L89520 Pressure ulcer of left ankle, unstageable: Secondary | ICD-10-CM | POA: Diagnosis not present

## 2018-12-20 DIAGNOSIS — Z1159 Encounter for screening for other viral diseases: Secondary | ICD-10-CM | POA: Diagnosis not present

## 2018-12-20 DIAGNOSIS — R7881 Bacteremia: Secondary | ICD-10-CM | POA: Diagnosis not present

## 2018-12-20 DIAGNOSIS — E119 Type 2 diabetes mellitus without complications: Secondary | ICD-10-CM | POA: Diagnosis not present

## 2018-12-20 DIAGNOSIS — R6521 Severe sepsis with septic shock: Secondary | ICD-10-CM | POA: Diagnosis not present

## 2018-12-21 DIAGNOSIS — L89620 Pressure ulcer of left heel, unstageable: Secondary | ICD-10-CM | POA: Diagnosis not present

## 2018-12-21 DIAGNOSIS — R1310 Dysphagia, unspecified: Secondary | ICD-10-CM | POA: Diagnosis not present

## 2018-12-21 DIAGNOSIS — J9621 Acute and chronic respiratory failure with hypoxia: Secondary | ICD-10-CM | POA: Diagnosis not present

## 2018-12-21 DIAGNOSIS — G40909 Epilepsy, unspecified, not intractable, without status epilepticus: Secondary | ICD-10-CM | POA: Diagnosis not present

## 2018-12-22 DIAGNOSIS — R7881 Bacteremia: Secondary | ICD-10-CM | POA: Diagnosis not present

## 2018-12-22 DIAGNOSIS — E119 Type 2 diabetes mellitus without complications: Secondary | ICD-10-CM | POA: Diagnosis not present

## 2018-12-22 DIAGNOSIS — Z1159 Encounter for screening for other viral diseases: Secondary | ICD-10-CM | POA: Diagnosis not present

## 2018-12-22 DIAGNOSIS — L89520 Pressure ulcer of left ankle, unstageable: Secondary | ICD-10-CM | POA: Diagnosis not present

## 2018-12-22 DIAGNOSIS — R6521 Severe sepsis with septic shock: Secondary | ICD-10-CM | POA: Diagnosis not present

## 2018-12-22 DIAGNOSIS — J158 Pneumonia due to other specified bacteria: Secondary | ICD-10-CM | POA: Diagnosis not present

## 2018-12-24 DIAGNOSIS — J9600 Acute respiratory failure, unspecified whether with hypoxia or hypercapnia: Secondary | ICD-10-CM | POA: Diagnosis not present

## 2018-12-26 DIAGNOSIS — E119 Type 2 diabetes mellitus without complications: Secondary | ICD-10-CM | POA: Diagnosis not present

## 2018-12-26 DIAGNOSIS — J158 Pneumonia due to other specified bacteria: Secondary | ICD-10-CM | POA: Diagnosis not present

## 2018-12-26 DIAGNOSIS — R6521 Severe sepsis with septic shock: Secondary | ICD-10-CM | POA: Diagnosis not present

## 2018-12-26 DIAGNOSIS — Z1159 Encounter for screening for other viral diseases: Secondary | ICD-10-CM | POA: Diagnosis not present

## 2018-12-26 DIAGNOSIS — L89520 Pressure ulcer of left ankle, unstageable: Secondary | ICD-10-CM | POA: Diagnosis not present

## 2018-12-26 DIAGNOSIS — R7881 Bacteremia: Secondary | ICD-10-CM | POA: Diagnosis not present

## 2018-12-28 DIAGNOSIS — E119 Type 2 diabetes mellitus without complications: Secondary | ICD-10-CM | POA: Diagnosis not present

## 2018-12-28 DIAGNOSIS — Z1159 Encounter for screening for other viral diseases: Secondary | ICD-10-CM | POA: Diagnosis not present

## 2018-12-28 DIAGNOSIS — L89520 Pressure ulcer of left ankle, unstageable: Secondary | ICD-10-CM | POA: Diagnosis not present

## 2018-12-28 DIAGNOSIS — J158 Pneumonia due to other specified bacteria: Secondary | ICD-10-CM | POA: Diagnosis not present

## 2018-12-28 DIAGNOSIS — R6521 Severe sepsis with septic shock: Secondary | ICD-10-CM | POA: Diagnosis not present

## 2018-12-28 DIAGNOSIS — R7881 Bacteremia: Secondary | ICD-10-CM | POA: Diagnosis not present

## 2018-12-28 DIAGNOSIS — J9690 Respiratory failure, unspecified, unspecified whether with hypoxia or hypercapnia: Secondary | ICD-10-CM | POA: Diagnosis not present

## 2018-12-29 DIAGNOSIS — L89520 Pressure ulcer of left ankle, unstageable: Secondary | ICD-10-CM | POA: Diagnosis not present

## 2018-12-29 DIAGNOSIS — R6521 Severe sepsis with septic shock: Secondary | ICD-10-CM | POA: Diagnosis not present

## 2018-12-29 DIAGNOSIS — E119 Type 2 diabetes mellitus without complications: Secondary | ICD-10-CM | POA: Diagnosis not present

## 2018-12-29 DIAGNOSIS — J158 Pneumonia due to other specified bacteria: Secondary | ICD-10-CM | POA: Diagnosis not present

## 2018-12-29 DIAGNOSIS — Z1159 Encounter for screening for other viral diseases: Secondary | ICD-10-CM | POA: Diagnosis not present

## 2018-12-29 DIAGNOSIS — R7881 Bacteremia: Secondary | ICD-10-CM | POA: Diagnosis not present

## 2019-01-02 DIAGNOSIS — J158 Pneumonia due to other specified bacteria: Secondary | ICD-10-CM | POA: Diagnosis not present

## 2019-01-02 DIAGNOSIS — E119 Type 2 diabetes mellitus without complications: Secondary | ICD-10-CM | POA: Diagnosis not present

## 2019-01-02 DIAGNOSIS — R6521 Severe sepsis with septic shock: Secondary | ICD-10-CM | POA: Diagnosis not present

## 2019-01-02 DIAGNOSIS — R7881 Bacteremia: Secondary | ICD-10-CM | POA: Diagnosis not present

## 2019-01-02 DIAGNOSIS — L89520 Pressure ulcer of left ankle, unstageable: Secondary | ICD-10-CM | POA: Diagnosis not present

## 2019-01-02 DIAGNOSIS — Z1159 Encounter for screening for other viral diseases: Secondary | ICD-10-CM | POA: Diagnosis not present

## 2019-01-04 DIAGNOSIS — L89520 Pressure ulcer of left ankle, unstageable: Secondary | ICD-10-CM | POA: Diagnosis not present

## 2019-01-04 DIAGNOSIS — R6521 Severe sepsis with septic shock: Secondary | ICD-10-CM | POA: Diagnosis not present

## 2019-01-04 DIAGNOSIS — R7881 Bacteremia: Secondary | ICD-10-CM | POA: Diagnosis not present

## 2019-01-04 DIAGNOSIS — J158 Pneumonia due to other specified bacteria: Secondary | ICD-10-CM | POA: Diagnosis not present

## 2019-01-04 DIAGNOSIS — E119 Type 2 diabetes mellitus without complications: Secondary | ICD-10-CM | POA: Diagnosis not present

## 2019-01-04 DIAGNOSIS — Z1159 Encounter for screening for other viral diseases: Secondary | ICD-10-CM | POA: Diagnosis not present

## 2019-01-05 DIAGNOSIS — J9600 Acute respiratory failure, unspecified whether with hypoxia or hypercapnia: Secondary | ICD-10-CM | POA: Diagnosis not present

## 2019-01-09 DIAGNOSIS — K929 Disease of digestive system, unspecified: Secondary | ICD-10-CM | POA: Diagnosis not present

## 2019-01-09 DIAGNOSIS — E119 Type 2 diabetes mellitus without complications: Secondary | ICD-10-CM | POA: Diagnosis not present

## 2019-01-09 DIAGNOSIS — R7881 Bacteremia: Secondary | ICD-10-CM | POA: Diagnosis not present

## 2019-01-09 DIAGNOSIS — L03319 Cellulitis of trunk, unspecified: Secondary | ICD-10-CM | POA: Diagnosis not present

## 2019-01-09 DIAGNOSIS — R1311 Dysphagia, oral phase: Secondary | ICD-10-CM | POA: Diagnosis not present

## 2019-01-09 DIAGNOSIS — R6521 Severe sepsis with septic shock: Secondary | ICD-10-CM | POA: Diagnosis not present

## 2019-01-09 DIAGNOSIS — K9423 Gastrostomy malfunction: Secondary | ICD-10-CM | POA: Diagnosis not present

## 2019-01-09 DIAGNOSIS — Z431 Encounter for attention to gastrostomy: Secondary | ICD-10-CM | POA: Diagnosis not present

## 2019-01-09 DIAGNOSIS — L89520 Pressure ulcer of left ankle, unstageable: Secondary | ICD-10-CM | POA: Diagnosis not present

## 2019-01-09 DIAGNOSIS — Z1159 Encounter for screening for other viral diseases: Secondary | ICD-10-CM | POA: Diagnosis not present

## 2019-01-09 DIAGNOSIS — Z9911 Dependence on respirator [ventilator] status: Secondary | ICD-10-CM | POA: Diagnosis not present

## 2019-01-09 DIAGNOSIS — J158 Pneumonia due to other specified bacteria: Secondary | ICD-10-CM | POA: Diagnosis not present

## 2019-01-11 DIAGNOSIS — E119 Type 2 diabetes mellitus without complications: Secondary | ICD-10-CM | POA: Diagnosis not present

## 2019-01-11 DIAGNOSIS — L89520 Pressure ulcer of left ankle, unstageable: Secondary | ICD-10-CM | POA: Diagnosis not present

## 2019-01-11 DIAGNOSIS — R6521 Severe sepsis with septic shock: Secondary | ICD-10-CM | POA: Diagnosis not present

## 2019-01-11 DIAGNOSIS — Z1159 Encounter for screening for other viral diseases: Secondary | ICD-10-CM | POA: Diagnosis not present

## 2019-01-11 DIAGNOSIS — R7881 Bacteremia: Secondary | ICD-10-CM | POA: Diagnosis not present

## 2019-01-11 DIAGNOSIS — J158 Pneumonia due to other specified bacteria: Secondary | ICD-10-CM | POA: Diagnosis not present

## 2019-01-14 DIAGNOSIS — J158 Pneumonia due to other specified bacteria: Secondary | ICD-10-CM | POA: Diagnosis not present

## 2019-01-14 DIAGNOSIS — E119 Type 2 diabetes mellitus without complications: Secondary | ICD-10-CM | POA: Diagnosis not present

## 2019-01-14 DIAGNOSIS — R6521 Severe sepsis with septic shock: Secondary | ICD-10-CM | POA: Diagnosis not present

## 2019-01-14 DIAGNOSIS — Z1159 Encounter for screening for other viral diseases: Secondary | ICD-10-CM | POA: Diagnosis not present

## 2019-01-14 DIAGNOSIS — R7881 Bacteremia: Secondary | ICD-10-CM | POA: Diagnosis not present

## 2019-01-14 DIAGNOSIS — L89520 Pressure ulcer of left ankle, unstageable: Secondary | ICD-10-CM | POA: Diagnosis not present

## 2019-01-15 DIAGNOSIS — J9600 Acute respiratory failure, unspecified whether with hypoxia or hypercapnia: Secondary | ICD-10-CM | POA: Diagnosis not present

## 2019-01-16 DIAGNOSIS — R7881 Bacteremia: Secondary | ICD-10-CM | POA: Diagnosis not present

## 2019-01-16 DIAGNOSIS — E119 Type 2 diabetes mellitus without complications: Secondary | ICD-10-CM | POA: Diagnosis not present

## 2019-01-16 DIAGNOSIS — R6521 Severe sepsis with septic shock: Secondary | ICD-10-CM | POA: Diagnosis not present

## 2019-01-16 DIAGNOSIS — L89520 Pressure ulcer of left ankle, unstageable: Secondary | ICD-10-CM | POA: Diagnosis not present

## 2019-01-16 DIAGNOSIS — J158 Pneumonia due to other specified bacteria: Secondary | ICD-10-CM | POA: Diagnosis not present

## 2019-01-16 DIAGNOSIS — Z1159 Encounter for screening for other viral diseases: Secondary | ICD-10-CM | POA: Diagnosis not present

## 2019-01-17 DIAGNOSIS — R6521 Severe sepsis with septic shock: Secondary | ICD-10-CM | POA: Diagnosis not present

## 2019-01-17 DIAGNOSIS — Z1159 Encounter for screening for other viral diseases: Secondary | ICD-10-CM | POA: Diagnosis not present

## 2019-01-17 DIAGNOSIS — E119 Type 2 diabetes mellitus without complications: Secondary | ICD-10-CM | POA: Diagnosis not present

## 2019-01-17 DIAGNOSIS — R7881 Bacteremia: Secondary | ICD-10-CM | POA: Diagnosis not present

## 2019-01-17 DIAGNOSIS — L89520 Pressure ulcer of left ankle, unstageable: Secondary | ICD-10-CM | POA: Diagnosis not present

## 2019-01-17 DIAGNOSIS — J158 Pneumonia due to other specified bacteria: Secondary | ICD-10-CM | POA: Diagnosis not present

## 2019-01-20 DIAGNOSIS — J9600 Acute respiratory failure, unspecified whether with hypoxia or hypercapnia: Secondary | ICD-10-CM | POA: Diagnosis not present

## 2019-01-23 DIAGNOSIS — L89520 Pressure ulcer of left ankle, unstageable: Secondary | ICD-10-CM | POA: Diagnosis not present

## 2019-01-23 DIAGNOSIS — Z1159 Encounter for screening for other viral diseases: Secondary | ICD-10-CM | POA: Diagnosis not present

## 2019-01-23 DIAGNOSIS — E119 Type 2 diabetes mellitus without complications: Secondary | ICD-10-CM | POA: Diagnosis not present

## 2019-01-23 DIAGNOSIS — J158 Pneumonia due to other specified bacteria: Secondary | ICD-10-CM | POA: Diagnosis not present

## 2019-01-23 DIAGNOSIS — R6521 Severe sepsis with septic shock: Secondary | ICD-10-CM | POA: Diagnosis not present

## 2019-01-23 DIAGNOSIS — R7881 Bacteremia: Secondary | ICD-10-CM | POA: Diagnosis not present

## 2019-01-24 DIAGNOSIS — J9690 Respiratory failure, unspecified, unspecified whether with hypoxia or hypercapnia: Secondary | ICD-10-CM | POA: Diagnosis not present

## 2019-01-24 DIAGNOSIS — R7881 Bacteremia: Secondary | ICD-10-CM | POA: Diagnosis not present

## 2019-01-24 DIAGNOSIS — J158 Pneumonia due to other specified bacteria: Secondary | ICD-10-CM | POA: Diagnosis not present

## 2019-01-24 DIAGNOSIS — R6521 Severe sepsis with septic shock: Secondary | ICD-10-CM | POA: Diagnosis not present

## 2019-01-24 DIAGNOSIS — L89520 Pressure ulcer of left ankle, unstageable: Secondary | ICD-10-CM | POA: Diagnosis not present

## 2019-01-24 DIAGNOSIS — E119 Type 2 diabetes mellitus without complications: Secondary | ICD-10-CM | POA: Diagnosis not present

## 2019-01-24 DIAGNOSIS — Z1159 Encounter for screening for other viral diseases: Secondary | ICD-10-CM | POA: Diagnosis not present

## 2019-01-30 DIAGNOSIS — L89520 Pressure ulcer of left ankle, unstageable: Secondary | ICD-10-CM | POA: Diagnosis not present

## 2019-01-30 DIAGNOSIS — R6521 Severe sepsis with septic shock: Secondary | ICD-10-CM | POA: Diagnosis not present

## 2019-01-30 DIAGNOSIS — J158 Pneumonia due to other specified bacteria: Secondary | ICD-10-CM | POA: Diagnosis not present

## 2019-01-30 DIAGNOSIS — Z1159 Encounter for screening for other viral diseases: Secondary | ICD-10-CM | POA: Diagnosis not present

## 2019-01-30 DIAGNOSIS — E119 Type 2 diabetes mellitus without complications: Secondary | ICD-10-CM | POA: Diagnosis not present

## 2019-01-30 DIAGNOSIS — R7881 Bacteremia: Secondary | ICD-10-CM | POA: Diagnosis not present

## 2019-01-31 DIAGNOSIS — R6521 Severe sepsis with septic shock: Secondary | ICD-10-CM | POA: Diagnosis not present

## 2019-01-31 DIAGNOSIS — L89520 Pressure ulcer of left ankle, unstageable: Secondary | ICD-10-CM | POA: Diagnosis not present

## 2019-01-31 DIAGNOSIS — J158 Pneumonia due to other specified bacteria: Secondary | ICD-10-CM | POA: Diagnosis not present

## 2019-01-31 DIAGNOSIS — R7881 Bacteremia: Secondary | ICD-10-CM | POA: Diagnosis not present

## 2019-01-31 DIAGNOSIS — J9600 Acute respiratory failure, unspecified whether with hypoxia or hypercapnia: Secondary | ICD-10-CM | POA: Diagnosis not present

## 2019-01-31 DIAGNOSIS — Z1159 Encounter for screening for other viral diseases: Secondary | ICD-10-CM | POA: Diagnosis not present

## 2019-01-31 DIAGNOSIS — E119 Type 2 diabetes mellitus without complications: Secondary | ICD-10-CM | POA: Diagnosis not present

## 2019-02-03 DIAGNOSIS — E119 Type 2 diabetes mellitus without complications: Secondary | ICD-10-CM | POA: Diagnosis not present

## 2019-02-03 DIAGNOSIS — R7881 Bacteremia: Secondary | ICD-10-CM | POA: Diagnosis not present

## 2019-02-03 DIAGNOSIS — R6521 Severe sepsis with septic shock: Secondary | ICD-10-CM | POA: Diagnosis not present

## 2019-02-03 DIAGNOSIS — Z1159 Encounter for screening for other viral diseases: Secondary | ICD-10-CM | POA: Diagnosis not present

## 2019-02-03 DIAGNOSIS — L89520 Pressure ulcer of left ankle, unstageable: Secondary | ICD-10-CM | POA: Diagnosis not present

## 2019-02-03 DIAGNOSIS — J158 Pneumonia due to other specified bacteria: Secondary | ICD-10-CM | POA: Diagnosis not present

## 2019-02-03 DIAGNOSIS — R918 Other nonspecific abnormal finding of lung field: Secondary | ICD-10-CM | POA: Diagnosis not present

## 2019-02-04 DIAGNOSIS — J9600 Acute respiratory failure, unspecified whether with hypoxia or hypercapnia: Secondary | ICD-10-CM | POA: Diagnosis not present

## 2019-02-06 DIAGNOSIS — J158 Pneumonia due to other specified bacteria: Secondary | ICD-10-CM | POA: Diagnosis not present

## 2019-02-06 DIAGNOSIS — R7881 Bacteremia: Secondary | ICD-10-CM | POA: Diagnosis not present

## 2019-02-06 DIAGNOSIS — Z1159 Encounter for screening for other viral diseases: Secondary | ICD-10-CM | POA: Diagnosis not present

## 2019-02-06 DIAGNOSIS — L89520 Pressure ulcer of left ankle, unstageable: Secondary | ICD-10-CM | POA: Diagnosis not present

## 2019-02-06 DIAGNOSIS — R6521 Severe sepsis with septic shock: Secondary | ICD-10-CM | POA: Diagnosis not present

## 2019-02-06 DIAGNOSIS — E119 Type 2 diabetes mellitus without complications: Secondary | ICD-10-CM | POA: Diagnosis not present

## 2019-02-08 DIAGNOSIS — R7881 Bacteremia: Secondary | ICD-10-CM | POA: Diagnosis not present

## 2019-02-08 DIAGNOSIS — Z1159 Encounter for screening for other viral diseases: Secondary | ICD-10-CM | POA: Diagnosis not present

## 2019-02-08 DIAGNOSIS — R6521 Severe sepsis with septic shock: Secondary | ICD-10-CM | POA: Diagnosis not present

## 2019-02-08 DIAGNOSIS — L89520 Pressure ulcer of left ankle, unstageable: Secondary | ICD-10-CM | POA: Diagnosis not present

## 2019-02-08 DIAGNOSIS — J158 Pneumonia due to other specified bacteria: Secondary | ICD-10-CM | POA: Diagnosis not present

## 2019-02-08 DIAGNOSIS — E119 Type 2 diabetes mellitus without complications: Secondary | ICD-10-CM | POA: Diagnosis not present

## 2019-02-11 DIAGNOSIS — Z1159 Encounter for screening for other viral diseases: Secondary | ICD-10-CM | POA: Diagnosis not present

## 2019-02-11 DIAGNOSIS — E119 Type 2 diabetes mellitus without complications: Secondary | ICD-10-CM | POA: Diagnosis not present

## 2019-02-11 DIAGNOSIS — R7881 Bacteremia: Secondary | ICD-10-CM | POA: Diagnosis not present

## 2019-02-11 DIAGNOSIS — J158 Pneumonia due to other specified bacteria: Secondary | ICD-10-CM | POA: Diagnosis not present

## 2019-02-11 DIAGNOSIS — R6521 Severe sepsis with septic shock: Secondary | ICD-10-CM | POA: Diagnosis not present

## 2019-02-11 DIAGNOSIS — L89520 Pressure ulcer of left ankle, unstageable: Secondary | ICD-10-CM | POA: Diagnosis not present

## 2019-02-12 ENCOUNTER — Inpatient Hospital Stay (HOSPITAL_COMMUNITY): Payer: Medicare Other

## 2019-02-12 ENCOUNTER — Inpatient Hospital Stay (HOSPITAL_COMMUNITY)
Admission: EM | Admit: 2019-02-12 | Discharge: 2019-03-11 | DRG: 871 | Disposition: E | Payer: Medicare Other | Attending: Internal Medicine | Admitting: Internal Medicine

## 2019-02-12 ENCOUNTER — Other Ambulatory Visit: Payer: Self-pay

## 2019-02-12 ENCOUNTER — Emergency Department (HOSPITAL_COMMUNITY): Payer: Medicare Other

## 2019-02-12 DIAGNOSIS — Z87442 Personal history of urinary calculi: Secondary | ICD-10-CM

## 2019-02-12 DIAGNOSIS — E785 Hyperlipidemia, unspecified: Secondary | ICD-10-CM

## 2019-02-12 DIAGNOSIS — J189 Pneumonia, unspecified organism: Secondary | ICD-10-CM

## 2019-02-12 DIAGNOSIS — Y95 Nosocomial condition: Secondary | ICD-10-CM | POA: Diagnosis present

## 2019-02-12 DIAGNOSIS — A419 Sepsis, unspecified organism: Principal | ICD-10-CM

## 2019-02-12 DIAGNOSIS — E878 Other disorders of electrolyte and fluid balance, not elsewhere classified: Secondary | ICD-10-CM

## 2019-02-12 DIAGNOSIS — Z930 Tracheostomy status: Secondary | ICD-10-CM

## 2019-02-12 DIAGNOSIS — Z20828 Contact with and (suspected) exposure to other viral communicable diseases: Secondary | ICD-10-CM | POA: Diagnosis present

## 2019-02-12 DIAGNOSIS — B999 Unspecified infectious disease: Secondary | ICD-10-CM | POA: Diagnosis not present

## 2019-02-12 DIAGNOSIS — Z1159 Encounter for screening for other viral diseases: Secondary | ICD-10-CM | POA: Diagnosis not present

## 2019-02-12 DIAGNOSIS — E872 Acidosis, unspecified: Secondary | ICD-10-CM

## 2019-02-12 DIAGNOSIS — N179 Acute kidney failure, unspecified: Secondary | ICD-10-CM

## 2019-02-12 DIAGNOSIS — K219 Gastro-esophageal reflux disease without esophagitis: Secondary | ICD-10-CM | POA: Diagnosis present

## 2019-02-12 DIAGNOSIS — I252 Old myocardial infarction: Secondary | ICD-10-CM

## 2019-02-12 DIAGNOSIS — I96 Gangrene, not elsewhere classified: Secondary | ICD-10-CM | POA: Diagnosis present

## 2019-02-12 DIAGNOSIS — N189 Chronic kidney disease, unspecified: Secondary | ICD-10-CM | POA: Diagnosis not present

## 2019-02-12 DIAGNOSIS — R7881 Bacteremia: Secondary | ICD-10-CM | POA: Diagnosis not present

## 2019-02-12 DIAGNOSIS — I2510 Atherosclerotic heart disease of native coronary artery without angina pectoris: Secondary | ICD-10-CM | POA: Diagnosis present

## 2019-02-12 DIAGNOSIS — I110 Hypertensive heart disease with heart failure: Secondary | ICD-10-CM | POA: Diagnosis present

## 2019-02-12 DIAGNOSIS — J440 Chronic obstructive pulmonary disease with acute lower respiratory infection: Secondary | ICD-10-CM | POA: Diagnosis present

## 2019-02-12 DIAGNOSIS — F419 Anxiety disorder, unspecified: Secondary | ICD-10-CM | POA: Diagnosis present

## 2019-02-12 DIAGNOSIS — Z515 Encounter for palliative care: Secondary | ICD-10-CM | POA: Diagnosis not present

## 2019-02-12 DIAGNOSIS — J9610 Chronic respiratory failure, unspecified whether with hypoxia or hypercapnia: Secondary | ICD-10-CM

## 2019-02-12 DIAGNOSIS — Z90710 Acquired absence of both cervix and uterus: Secondary | ICD-10-CM

## 2019-02-12 DIAGNOSIS — D539 Nutritional anemia, unspecified: Secondary | ICD-10-CM

## 2019-02-12 DIAGNOSIS — I5022 Chronic systolic (congestive) heart failure: Secondary | ICD-10-CM | POA: Diagnosis present

## 2019-02-12 DIAGNOSIS — Z931 Gastrostomy status: Secondary | ICD-10-CM | POA: Diagnosis not present

## 2019-02-12 DIAGNOSIS — Z9911 Dependence on respirator [ventilator] status: Secondary | ICD-10-CM | POA: Diagnosis not present

## 2019-02-12 DIAGNOSIS — Z86718 Personal history of other venous thrombosis and embolism: Secondary | ICD-10-CM

## 2019-02-12 DIAGNOSIS — L89620 Pressure ulcer of left heel, unstageable: Secondary | ICD-10-CM | POA: Diagnosis present

## 2019-02-12 DIAGNOSIS — R402112 Coma scale, eyes open, never, at arrival to emergency department: Secondary | ICD-10-CM | POA: Diagnosis present

## 2019-02-12 DIAGNOSIS — E870 Hyperosmolality and hypernatremia: Secondary | ICD-10-CM

## 2019-02-12 DIAGNOSIS — Z955 Presence of coronary angioplasty implant and graft: Secondary | ICD-10-CM

## 2019-02-12 DIAGNOSIS — N170 Acute kidney failure with tubular necrosis: Secondary | ICD-10-CM | POA: Diagnosis present

## 2019-02-12 DIAGNOSIS — L8990 Pressure ulcer of unspecified site, unspecified stage: Secondary | ICD-10-CM | POA: Diagnosis present

## 2019-02-12 DIAGNOSIS — Z66 Do not resuscitate: Secondary | ICD-10-CM | POA: Diagnosis present

## 2019-02-12 DIAGNOSIS — Z7189 Other specified counseling: Secondary | ICD-10-CM | POA: Diagnosis not present

## 2019-02-12 DIAGNOSIS — Z823 Family history of stroke: Secondary | ICD-10-CM

## 2019-02-12 DIAGNOSIS — Z87891 Personal history of nicotine dependence: Secondary | ICD-10-CM

## 2019-02-12 DIAGNOSIS — R402342 Coma scale, best motor response, flexion withdrawal, at arrival to emergency department: Secondary | ICD-10-CM | POA: Diagnosis present

## 2019-02-12 DIAGNOSIS — L89150 Pressure ulcer of sacral region, unstageable: Secondary | ICD-10-CM | POA: Diagnosis present

## 2019-02-12 DIAGNOSIS — J9611 Chronic respiratory failure with hypoxia: Secondary | ICD-10-CM | POA: Diagnosis present

## 2019-02-12 DIAGNOSIS — E875 Hyperkalemia: Secondary | ICD-10-CM

## 2019-02-12 DIAGNOSIS — R6521 Severe sepsis with septic shock: Secondary | ICD-10-CM | POA: Diagnosis present

## 2019-02-12 DIAGNOSIS — L89153 Pressure ulcer of sacral region, stage 3: Secondary | ICD-10-CM | POA: Diagnosis not present

## 2019-02-12 DIAGNOSIS — I998 Other disorder of circulatory system: Secondary | ICD-10-CM | POA: Diagnosis not present

## 2019-02-12 DIAGNOSIS — M1990 Unspecified osteoarthritis, unspecified site: Secondary | ICD-10-CM | POA: Diagnosis present

## 2019-02-12 DIAGNOSIS — E119 Type 2 diabetes mellitus without complications: Secondary | ICD-10-CM | POA: Diagnosis not present

## 2019-02-12 DIAGNOSIS — R918 Other nonspecific abnormal finding of lung field: Secondary | ICD-10-CM | POA: Diagnosis not present

## 2019-02-12 DIAGNOSIS — J158 Pneumonia due to other specified bacteria: Secondary | ICD-10-CM | POA: Diagnosis not present

## 2019-02-12 DIAGNOSIS — I482 Chronic atrial fibrillation, unspecified: Secondary | ICD-10-CM | POA: Diagnosis present

## 2019-02-12 DIAGNOSIS — Z8673 Personal history of transient ischemic attack (TIA), and cerebral infarction without residual deficits: Secondary | ICD-10-CM

## 2019-02-12 DIAGNOSIS — Z7982 Long term (current) use of aspirin: Secondary | ICD-10-CM

## 2019-02-12 DIAGNOSIS — I83003 Varicose veins of unspecified lower extremity with ulcer of ankle: Secondary | ICD-10-CM | POA: Diagnosis present

## 2019-02-12 DIAGNOSIS — Z8711 Personal history of peptic ulcer disease: Secondary | ICD-10-CM

## 2019-02-12 DIAGNOSIS — L97309 Non-pressure chronic ulcer of unspecified ankle with unspecified severity: Secondary | ICD-10-CM | POA: Diagnosis present

## 2019-02-12 DIAGNOSIS — R402212 Coma scale, best verbal response, none, at arrival to emergency department: Secondary | ICD-10-CM | POA: Diagnosis present

## 2019-02-12 DIAGNOSIS — L97529 Non-pressure chronic ulcer of other part of left foot with unspecified severity: Secondary | ICD-10-CM | POA: Diagnosis present

## 2019-02-12 DIAGNOSIS — L089 Local infection of the skin and subcutaneous tissue, unspecified: Secondary | ICD-10-CM | POA: Diagnosis not present

## 2019-02-12 DIAGNOSIS — N19 Unspecified kidney failure: Secondary | ICD-10-CM

## 2019-02-12 DIAGNOSIS — E860 Dehydration: Secondary | ICD-10-CM | POA: Diagnosis present

## 2019-02-12 DIAGNOSIS — Z7901 Long term (current) use of anticoagulants: Secondary | ICD-10-CM

## 2019-02-12 DIAGNOSIS — G40909 Epilepsy, unspecified, not intractable, without status epilepticus: Secondary | ICD-10-CM

## 2019-02-12 DIAGNOSIS — D509 Iron deficiency anemia, unspecified: Secondary | ICD-10-CM | POA: Diagnosis present

## 2019-02-12 DIAGNOSIS — Z79899 Other long term (current) drug therapy: Secondary | ICD-10-CM

## 2019-02-12 DIAGNOSIS — R6520 Severe sepsis without septic shock: Secondary | ICD-10-CM | POA: Diagnosis not present

## 2019-02-12 DIAGNOSIS — L89520 Pressure ulcer of left ankle, unstageable: Secondary | ICD-10-CM | POA: Diagnosis not present

## 2019-02-12 LAB — PROTIME-INR
INR: 1.9 — ABNORMAL HIGH (ref 0.8–1.2)
Prothrombin Time: 21.5 seconds — ABNORMAL HIGH (ref 11.4–15.2)

## 2019-02-12 LAB — CBC WITH DIFFERENTIAL/PLATELET
Abs Immature Granulocytes: 0.21 10*3/uL — ABNORMAL HIGH (ref 0.00–0.07)
Basophils Absolute: 0 10*3/uL (ref 0.0–0.1)
Basophils Relative: 0 %
Eosinophils Absolute: 0.3 10*3/uL (ref 0.0–0.5)
Eosinophils Relative: 2 %
HCT: 30.4 % — ABNORMAL LOW (ref 36.0–46.0)
Hemoglobin: 8.3 g/dL — ABNORMAL LOW (ref 12.0–15.0)
Immature Granulocytes: 1 %
Lymphocytes Relative: 12 %
Lymphs Abs: 1.9 10*3/uL (ref 0.7–4.0)
MCH: 27.8 pg (ref 26.0–34.0)
MCHC: 27.3 g/dL — ABNORMAL LOW (ref 30.0–36.0)
MCV: 101.7 fL — ABNORMAL HIGH (ref 80.0–100.0)
Monocytes Absolute: 0.3 10*3/uL (ref 0.1–1.0)
Monocytes Relative: 2 %
Neutro Abs: 12.6 10*3/uL — ABNORMAL HIGH (ref 1.7–7.7)
Neutrophils Relative %: 83 %
Platelets: 172 10*3/uL (ref 150–400)
RBC: 2.99 MIL/uL — ABNORMAL LOW (ref 3.87–5.11)
RDW: 16.8 % — ABNORMAL HIGH (ref 11.5–15.5)
WBC: 15.4 10*3/uL — ABNORMAL HIGH (ref 4.0–10.5)
nRBC: 0.2 % (ref 0.0–0.2)

## 2019-02-12 LAB — I-STAT CHEM 8, ED
BUN: >140 mg/dL — ABNORMAL HIGH (ref 8–23)
Calcium, Ion: 1.22 mmol/L (ref 1.15–1.40)
Chloride: 129 mmol/L — ABNORMAL HIGH (ref 98–111)
Creatinine, Ser: 2.6 mg/dL — ABNORMAL HIGH (ref 0.44–1.00)
Glucose, Bld: 152 mg/dL — ABNORMAL HIGH (ref 70–99)
HCT: 24 % — ABNORMAL LOW (ref 36.0–46.0)
Hemoglobin: 8.2 g/dL — ABNORMAL LOW (ref 12.0–15.0)
Potassium: 6.3 mmol/L (ref 3.5–5.1)
Sodium: 160 mmol/L — ABNORMAL HIGH (ref 135–145)
TCO2: 22 mmol/L (ref 22–32)

## 2019-02-12 LAB — COMPREHENSIVE METABOLIC PANEL
ALT: 67 U/L — ABNORMAL HIGH (ref 0–44)
AST: 65 U/L — ABNORMAL HIGH (ref 15–41)
Albumin: 1.6 g/dL — ABNORMAL LOW (ref 3.5–5.0)
Alkaline Phosphatase: 96 U/L (ref 38–126)
Anion gap: 11 (ref 5–15)
BUN: 227 mg/dL — ABNORMAL HIGH (ref 8–23)
CO2: 20 mmol/L — ABNORMAL LOW (ref 22–32)
Calcium: 8.7 mg/dL — ABNORMAL LOW (ref 8.9–10.3)
Chloride: 127 mmol/L — ABNORMAL HIGH (ref 98–111)
Creatinine, Ser: 3.14 mg/dL — ABNORMAL HIGH (ref 0.44–1.00)
GFR calc Af Amer: 15 mL/min — ABNORMAL LOW (ref >60)
GFR calc non Af Amer: 13 mL/min — ABNORMAL LOW (ref >60)
Glucose, Bld: 162 mg/dL — ABNORMAL HIGH (ref 70–99)
Potassium: 6.5 mmol/L (ref 3.5–5.1)
Sodium: 158 mmol/L — ABNORMAL HIGH (ref 135–145)
Total Bilirubin: 1.2 mg/dL (ref 0.3–1.2)
Total Protein: 6.5 g/dL (ref 6.5–8.1)

## 2019-02-12 LAB — POCT I-STAT 7, (LYTES, BLD GAS, ICA,H+H)
Acid-base deficit: 7 mmol/L — ABNORMAL HIGH (ref 0.0–2.0)
Bicarbonate: 19 mmol/L — ABNORMAL LOW (ref 20.0–28.0)
Calcium, Ion: 1.24 mmol/L (ref 1.15–1.40)
HCT: 22 % — ABNORMAL LOW (ref 36.0–46.0)
Hemoglobin: 7.5 g/dL — ABNORMAL LOW (ref 12.0–15.0)
O2 Saturation: 90 %
Patient temperature: 101.1
Potassium: 6.4 mmol/L (ref 3.5–5.1)
Sodium: 158 mmol/L — ABNORMAL HIGH (ref 135–145)
TCO2: 20 mmol/L — ABNORMAL LOW (ref 22–32)
pCO2 arterial: 43.6 mmHg (ref 32.0–48.0)
pH, Arterial: 7.254 — ABNORMAL LOW (ref 7.350–7.450)
pO2, Arterial: 72 mmHg — ABNORMAL LOW (ref 83.0–108.0)

## 2019-02-12 LAB — VANCOMYCIN, RANDOM: Vancomycin Rm: 25

## 2019-02-12 LAB — CBG MONITORING, ED
Glucose-Capillary: 149 mg/dL — ABNORMAL HIGH (ref 70–99)
Glucose-Capillary: 200 mg/dL — ABNORMAL HIGH (ref 70–99)

## 2019-02-12 LAB — SARS CORONAVIRUS 2 BY RT PCR (HOSPITAL ORDER, PERFORMED IN ~~LOC~~ HOSPITAL LAB): SARS Coronavirus 2: NEGATIVE

## 2019-02-12 LAB — LACTIC ACID, PLASMA: Lactic Acid, Venous: 2.5 mmol/L (ref 0.5–1.9)

## 2019-02-12 LAB — APTT: aPTT: 46 seconds — ABNORMAL HIGH (ref 24–36)

## 2019-02-12 MED ORDER — ASPIRIN 81 MG PO CHEW
81.0000 mg | CHEWABLE_TABLET | Freq: Every day | ORAL | Status: DC
  Administered 2019-02-12 – 2019-02-15 (×4): 81 mg
  Filled 2019-02-12 (×4): qty 1

## 2019-02-12 MED ORDER — FAMOTIDINE IN NACL 20-0.9 MG/50ML-% IV SOLN
20.0000 mg | Freq: Two times a day (BID) | INTRAVENOUS | Status: DC
  Administered 2019-02-13: 20 mg via INTRAVENOUS
  Filled 2019-02-12: qty 50

## 2019-02-12 MED ORDER — SODIUM CHLORIDE 0.9 % IV SOLN: INTRAVENOUS | Status: DC

## 2019-02-12 MED ORDER — SODIUM CHLORIDE 0.9% FLUSH
10.0000 mL | Freq: Two times a day (BID) | INTRAVENOUS | Status: DC
  Administered 2019-02-12: 20 mL
  Administered 2019-02-13: 21:00:00 10 mL
  Administered 2019-02-13: 10:00:00 20 mL
  Administered 2019-02-14 – 2019-02-15 (×2): 10 mL

## 2019-02-12 MED ORDER — LEVETIRACETAM 100 MG/ML PO SOLN
1000.0000 mg | Freq: Two times a day (BID) | ORAL | Status: DC
  Administered 2019-02-12 – 2019-02-13 (×2): 1000 mg
  Filled 2019-02-12 (×3): qty 10

## 2019-02-12 MED ORDER — ONDANSETRON HCL 4 MG PO TABS: 4.0000 mg | ORAL_TABLET | Freq: Four times a day (QID) | ORAL | Status: DC | PRN

## 2019-02-12 MED ORDER — ALTEPLASE 2 MG IJ SOLR
2.0000 mg | Freq: Once | INTRAMUSCULAR | Status: AC
  Administered 2019-02-12: 2 mg
  Filled 2019-02-12: qty 2

## 2019-02-12 MED ORDER — MIDODRINE HCL 5 MG PO TABS
5.0000 mg | ORAL_TABLET | Freq: Three times a day (TID) | ORAL | Status: DC
  Administered 2019-02-13 – 2019-02-15 (×9): 5 mg via ORAL
  Filled 2019-02-12 (×9): qty 1

## 2019-02-12 MED ORDER — SODIUM CHLORIDE 0.9% FLUSH: 10.0000 mL | INTRAVENOUS | Status: DC | PRN

## 2019-02-12 MED ORDER — DEXTROSE 10 % IV SOLN
Freq: Once | INTRAVENOUS | Status: AC
  Administered 2019-02-12: 17:00:00 via INTRAVENOUS

## 2019-02-12 MED ORDER — ORAL CARE MOUTH RINSE
15.0000 mL | OROMUCOSAL | Status: DC
  Administered 2019-02-13 – 2019-02-15 (×25): 15 mL via OROMUCOSAL

## 2019-02-12 MED ORDER — SODIUM CHLORIDE 0.9 % IV SOLN
2.0000 g | Freq: Once | INTRAVENOUS | Status: AC
  Administered 2019-02-12: 2 g via INTRAVENOUS
  Filled 2019-02-12: qty 2

## 2019-02-12 MED ORDER — PHENYTOIN 125 MG/5ML PO SUSP
300.0000 mg | Freq: Every day | ORAL | Status: DC
  Administered 2019-02-12 – 2019-02-14 (×3): 300 mg
  Filled 2019-02-12 (×4): qty 12

## 2019-02-12 MED ORDER — CHLORHEXIDINE GLUCONATE 0.12% ORAL RINSE (MEDLINE KIT)
15.0000 mL | Freq: Two times a day (BID) | OROMUCOSAL | Status: DC
  Administered 2019-02-13 – 2019-02-15 (×6): 15 mL via OROMUCOSAL

## 2019-02-12 MED ORDER — SODIUM POLYSTYRENE SULFONATE 15 GM/60ML PO SUSP
30.0000 g | Freq: Once | ORAL | Status: AC
  Administered 2019-02-12: 30 g
  Filled 2019-02-12: qty 120

## 2019-02-12 MED ORDER — PANTOPRAZOLE SODIUM 40 MG PO PACK
40.0000 mg | PACK | Freq: Every day | ORAL | Status: DC
  Administered 2019-02-13 – 2019-02-15 (×3): 40 mg
  Filled 2019-02-12 (×3): qty 20

## 2019-02-12 MED ORDER — CHLORHEXIDINE GLUCONATE CLOTH 2 % EX PADS
6.0000 | MEDICATED_PAD | Freq: Every day | CUTANEOUS | Status: DC
  Administered 2019-02-13 – 2019-02-15 (×3): 6 via TOPICAL

## 2019-02-12 MED ORDER — VALPROATE SODIUM 250 MG/5ML PO SOLN
250.0000 mg | Freq: Two times a day (BID) | ORAL | Status: DC
  Administered 2019-02-12 – 2019-02-15 (×6): 250 mg
  Filled 2019-02-12 (×8): qty 5

## 2019-02-12 MED ORDER — INSULIN ASPART 100 UNIT/ML IV SOLN
5.0000 [IU] | Freq: Once | INTRAVENOUS | Status: AC
  Administered 2019-02-12: 5 [IU] via INTRAVENOUS

## 2019-02-12 MED ORDER — VANCOMYCIN VARIABLE DOSE PER UNSTABLE RENAL FUNCTION (PHARMACIST DOSING): Status: DC

## 2019-02-12 MED ORDER — DEXTROSE 5 % IV SOLN
INTRAVENOUS | Status: DC
  Administered 2019-02-12 – 2019-02-15 (×7): via INTRAVENOUS

## 2019-02-12 MED ORDER — CALCIUM GLUCONATE 10 % IV SOLN
1.0000 g | Freq: Once | INTRAVENOUS | Status: DC
  Filled 2019-02-12: qty 10

## 2019-02-12 MED ORDER — DEXTROSE 50 % IV SOLN
1.0000 | Freq: Once | INTRAVENOUS | Status: AC
  Administered 2019-02-12: 50 mL via INTRAVENOUS
  Filled 2019-02-12: qty 50

## 2019-02-12 MED ORDER — METRONIDAZOLE IN NACL 5-0.79 MG/ML-% IV SOLN
500.0000 mg | Freq: Once | INTRAVENOUS | Status: AC
  Administered 2019-02-12: 500 mg via INTRAVENOUS
  Filled 2019-02-12: qty 100

## 2019-02-12 MED ORDER — SODIUM CHLORIDE 0.9 % IV SOLN
1.0000 g | INTRAVENOUS | Status: DC
  Administered 2019-02-13 – 2019-02-15 (×3): 1 g via INTRAVENOUS
  Filled 2019-02-12 (×3): qty 1

## 2019-02-12 MED ORDER — ONDANSETRON HCL 4 MG/2ML IJ SOLN: 4.0000 mg | Freq: Four times a day (QID) | INTRAMUSCULAR | Status: DC | PRN

## 2019-02-12 NOTE — Consult Note (Signed)
NAME:  Melissa Cochran, MRN:  [id number], DOB:  [birth year], LOS: 0 ADMISSION DATE:  02/18/2019, CONSULTATION DATE:  03/09/2019 REFERRING MD:  [facility], CHIEF COMPLAINT:    Brief History   83 yo F [facility] resident who presents to [facility] with AKI and progressive pressure wounds.   History of present illness   83 yo F PMH CVA, chronic respiratory failure, ischemic L foot, Afib (on apixaban) HTN, CAD, CHF, nephrolithiasis, complicated hydronephrosis, and seizure disorder. At baseline, patient withdraws from painful stimuli only. She is trach/vent/PEG dependent.  Patient brought to [facility] for worsening labs values at [facility], beginning last week. [facility] labs significant for BUN 218, K 6.3, Na 159. Follow up labs at [facility] are pending.   Patient has had several hospitalizations in 2020: 2/1-2/17, 4/4-4/17, 5/19-27, 7/4-7/22, 7/27-7/29  PCCM will consult for trach/vent  Past Medical History  CVA Chronic respiratory failure, VDRF, sp tracheostomy Ischemic L foot Seizure disorder Pressure injury of skin Hydronephrosis  CHF Afib CAD HTN  Significant Hospital Events   10/5 presents to ED  Consults:  PCCM Palliative Care   Procedures:    Significant Diagnostic Tests:  CXR 10/5> RUL opacities  Micro Data:  10/5 SARS CoV2>>>  Antimicrobials:  10/5-cefepime  10/5-vanc 10/5- flagyl  Interim history/subjective:  Asked RT to change patient from PC to [facility]   Objective   Blood pressure (!) 116/55, pulse (!) 120, temperature (!) 101.3 F (38.5 C), temperature source Rectal, resp. rate (!) 37, height 5\' 3"  (1.6 m), weight 68 kg, SpO2 92 %.    Vent Mode: PSV;CPAP FiO2 (%):  [40 %] 40 % PEEP:  [8 cmH20] 8 cmH20 Pressure Support:  [20 cmH20] 20 cmH20  No intake or output data in the 24 hours ending 02/10/2019 1618 Filed Weights   02/17/2019 1538  Weight: 68 kg    Examination: General: Chronically ill, minimally responsive elderly female NAD HENT: NCAT. Trach secure.  Lungs: Symmetrical chest expansion, no accessory muscle use Cardiovascular: RRR No JVD Abdomen: Soft, round, + PEG Extremities: Symmetrical muscle tone. L great toe ischemia.  Neuro: Minimally responsive, to painful stimuli  GU: defer  Skin: pale, sacral pressure injury. L heel pressure injury. Pressure injury posterior LLE   Resolved Hospital Problem list     Assessment & Plan:   Chronic vent-dependent respiratory failure -CXR 10/5 with RUL interstitial opacities. Possible pulm edema.  P Change PC to PRVC, wean as able  ABG in 1 hr Routine trach care    Goals of Care: Patient with severely debilitating underlying conditions and acute kidney injury with hyperkalemia.  Agree that patient is a poor candidate for RRT; and feel that goals of care discussion is warranted.  I will place palliative care consult   Rest per primary   Best practice:  Diet: PEG Pain/Anxiety/Delirium protocol (if indicated): VAP protocol (if indicated): yes DVT prophylaxis:  GI prophylaxis:  Glucose control: per primary Mobility: bedrest  Code Status: At present, Full Family Communication: Per primary Disposition: admit to triad. Patient with worsening underlying conditions. Nephrology has advised that RRT will not be pursued. Palliative care consult is appropriate consideration   Labs   CBC: Recent Labs  Lab 02/13/2019 1455 02/25/2019 1511  WBC 15.4*  --   NEUTROABS 12.6*  --   HGB 8.3* 8.2*  HCT 30.4* 24.0*  MCV 101.7*  --   PLT 172  --     Basic Metabolic Panel: Recent Labs  Lab 02/18/2019 1455 03/07/2019 1511  NA 158*  160*  K 6.5* 6.3*  CL 127* 129*  CO2 20*  --   GLUCOSE 162* 152*  BUN 227* >140*  CREATININE 3.14* 2.60*  CALCIUM 8.7*  --    GFR: Estimated Creatinine Clearance: 15.7 mL/min (A) (by C-G formula based on SCr of 2.6 mg/dL (H)). Recent Labs  Lab 02/13/2019 1455  WBC 15.4*  LATICACIDVEN 2.5*    Liver Function Tests: Recent Labs  Lab 02/27/2019 1455  AST 65*   ALT 67*  ALKPHOS 96  BILITOT 1.2  PROT 6.5  ALBUMIN 1.6*   No results for input(s): LIPASE, AMYLASE in the last 168 hours. No results for input(s): AMMONIA in the last 168 hours.  ABG    Component Value Date/Time   PHART 7.470 (H) 09/26/2018 1758   PCO2ART 44.2 09/26/2018 1758   PO2ART 65.0 (L) 09/26/2018 1758   HCO3 13.5 (L) 11/11/2018 2245   TCO2 22 02/14/2019 1511   ACIDBASEDEF 9.0 (H) 11/11/2018 2245   O2SAT 99.0 11/11/2018 2245     Coagulation Profile: Recent Labs  Lab 03/07/2019 1457  INR 1.9*    Cardiac Enzymes: No results for input(s): CKTOTAL, CKMB, CKMBINDEX, TROPONINI in the last 168 hours.  HbA1C: Hgb A1c MFr Bld  Date/Time Value Ref Range Status  02/13/2017 09:35 PM 5.1 4.8 - 5.6 % Final    Comment:    (NOTE) Pre diabetes:          5.7%-6.4% Diabetes:              >6.4% Glycemic control for   <7.0% adults with diabetes     CBG: Recent Labs  Lab 02/19/2019 1449  GLUCAP 149*    Review of Systems:   Unable to obtain, baseline non-verbal   Past Medical History  She,  has a past medical history of Adnexal mass, Anxiety, Arthritis, Bladder tumor, Bleeding ulcer, CHF (congestive heart failure) ([place]), CHF (congestive heart failure) ([place]), Chronic bronchitis ([place]), Chronic respiratory failure ([place]) (04/19/2018), COPD (chronic obstructive pulmonary disease) ([place]), Coronary artery disease, CVA (cerebral vascular accident) ([place]) (04/19/2018), DVT (deep venous thrombosis) ([place]), GERD (gastroesophageal reflux disease), Heart block, History of bleeding ulcers, History of kidney stones, Hyperlipemia, Hypertension, Myocardial infarction ([place]), Pneumonia, Seizures ([place]) (02/2017 "several"; 06/26/2017 X 1), Shortness of breath dyspnea, and Stroke ([place]) (02/13/2018).   Surgical History    Past Surgical History:  Procedure Laterality Date  . ABDOMINAL HYSTERECTOMY    . CARDIAC CATHETERIZATION Left 10/21/2015   Procedure: Left Heart Cath and Coronary Angiography;   Surgeon: Isaias Cowman, MD;  Location: [facility];  Service: Cardiovascular;  Laterality: Left;  . CARDIAC CATHETERIZATION N/A 10/21/2015   Procedure: Coronary Stent Intervention;  Surgeon: Isaias Cowman, MD;  Location: [facility];  Service: Cardiovascular;  Laterality: N/A;  . CARDIAC CATHETERIZATION    . CYSTOSCOPY W/ RETROGRADES Bilateral 01/20/2016   Procedure: CYSTOSCOPY WITH RETROGRADE PYELOGRAM;  Surgeon: Hollice Espy, MD;  Location: [facility];  Service: Urology;  Laterality: Bilateral;  . CYSTOSCOPY WITH STENT PLACEMENT Right 01/20/2016   Procedure: CYSTOSCOPY WITH STENT PLACEMENT;  Surgeon: Hollice Espy, MD;  Location: [facility];  Service: Urology;  Laterality: Right;  . HIP FRACTURE SURGERY Left   . STOMACH SURGERY     "stomach ulcers"  . TRACHEOSTOMY TUBE PLACEMENT N/A 03/02/2017   Procedure: TRACHEOSTOMY;  Surgeon: Rozetta Nunnery, MD;  Location: [facility];  Service: ENT;  Laterality: N/A;  . TRANSURETHRAL RESECTION OF BLADDER TUMOR N/A 01/20/2016   Procedure: TRANSURETHRAL RESECTION OF BLADDER  TUMOR (TURBT);  Surgeon: Hollice Espy, MD;  Location: [facility];  Service: Urology;  Laterality: N/A;     Social History   reports that she quit smoking about 20 years ago. She has a 40.00 pack-year smoking history. She has never used smokeless tobacco. She reports that she does not drink alcohol or use drugs.   Family History   Her family history includes CVA in her father; Stomach cancer in her mother. There is no history of Bladder Cancer or Kidney cancer.   Allergies No Known Allergies   Home Medications  Prior to Admission medications   Medication Sig Start Date End Date Taking? Authorizing Provider  acetaminophen (TYLENOL) 325 MG tablet Place 650 mg into feeding tube every 4 (four) hours as needed for mild pain.     [provider]  Amino Acids-Protein Hydrolys (FEEDING SUPPLEMENT, PRO-STAT SUGAR FREE 64,) LIQD Place 30 mLs into feeding  tube 3 (three) times daily. 10/04/18   Erick Colace, NP  aspirin 81 MG chewable tablet Place 1 tablet (81 mg total) into feeding tube daily. 06/19/18   Cherene Altes, MD  chlorhexidine (PERIDEX) 0.12 % solution Use as directed 15 mLs in the mouth or throat See admin instructions. 15 ml's by starting shift  times a day    [provider]  diphenoxylate-atropine (LOMOTIL) 2.5-0.025 MG tablet Place 1 tablet into feeding tube 4 (four) times daily as needed for diarrhea or loose stools. 11/27/18   Arrien, Jimmy Picket, MD  enoxaparin (LOVENOX) 40 MG/0.4ML injection Inject 0.4 mLs (40 mg total) into the skin daily. 10/05/18   Erick Colace, NP  famotidine (PEPCID) 20 MG tablet Place 20 mg into feeding tube daily.     [provider]  furosemide (LASIX) 10 MG/ML injection Inject 40 mg into the vein once.    [provider]  HYDROcodone-acetaminophen (NORCO/VICODIN) 5-325 MG tablet Place 1 tablet into feeding tube every 6 (six) hours as needed for moderate pain. 12/05/18   Darliss Cheney, MD  levETIRAcetam (KEPPRA) 100 MG/ML solution Place 10 mLs (1,000 mg total) into feeding tube 2 (two) times daily. Patient taking differently: Place 1,000 mg into feeding tube every 12 (twelve) hours.  06/19/18   Cherene Altes, MD  midodrine (PROAMATINE) 5 MG tablet Take 1 tablet (5 mg total) by mouth 3 (three) times daily with meals. Patient taking differently: Place 5 mg into feeding tube 3 (three) times daily with meals.  10/04/18   Erick Colace, NP  Multiple Vitamin (MULTIVITAMIN) LIQD Place 15 mLs into feeding tube daily. Patient taking differently: Place 15 mLs into feeding tube daily. Centrum 9 mg / iron 10/05/18   Erick Colace, NP  Nutritional Supplements (ISOSOURCE 1.5 CAL) LIQD 300 mLs by Enteral route continuous. 50 ml/hr with  1000 ml    [provider]  nystatin (MYCOSTATIN) 100000 UNIT/ML suspension Take 5 mLs (500,000 Units total) by mouth 4 (four) times  daily. Patient taking differently: Use as directed 5 mLs in the mouth or throat every 4 (four) hours.  10/04/18   Erick Colace, NP  pantoprazole sodium (PROTONIX) 40 mg/20 mL PACK Place 20 mLs (40 mg total) into feeding tube daily. 10/05/18   Erick Colace, NP  phenytoin (DILANTIN) 125 MG/5ML suspension Place 500 mg into feeding tube every 12 (twelve) hours.     [provider]  Skin Protectants, Misc. (CRITIC-AID EX) Apply 1 application topically daily. Apply to buttocks    [provider]  Sodium Chloride Flush (NORMAL SALINE FLUSH IV) Inject 10 mLs into the vein See admin instructions. Use 10 ml's via IV every shift for any used lumens- flush each unused lumen    [provider]  Valproate Sodium (DEPAKENE) 250 MG/5ML SOLN solution Place 5 mLs (250 mg total) into feeding tube 2 (two) times daily. Patient taking differently: Place 250 mg into feeding tube every 12 (twelve) hours.  06/19/18   Cherene Altes, MD  Water For Irrigation, Sterile (FREE WATER) SOLN Place 300 mLs into feeding tube every 4 (four) hours. 10/04/18   Erick Colace, NP      Eliseo Gum MSN, AGACNP-BC [place] [phone] If no answer, [id number] 02/22/2019, 5:00 PM

## 2019-02-12 NOTE — Progress Notes (Signed)
Vent settings switched from PS/CPAP to [facility] per CCM. Patient was on PS/CPAP from [facility]. ABG results reported to CCM. No vent changes at this time.

## 2019-02-12 NOTE — ED Notes (Signed)
Radiology at bedside for CXR

## 2019-02-12 NOTE — Progress Notes (Signed)
Pharmacy Antibiotic Note  Melissa Cochran is a 83 y.o. female admitted on 03/04/2019 with sepsis.  Pharmacy has been consulted for Vancomycin and Cefepime dosing.  Admitted from [facility] with AKI - Scr 2.6; CrCl 16 ml/min Per report to MD, patient was receiving Vancomycin at [facility].  Unknown dose or timing of last dose at this time.  Vancomycin random level 25 mg/dl.  Plan: Cefepime 2 gm IV x 1 in the ED, then 1 gram IV q24hr Vancomycin variable dosing based on changing renal function - will not redose vancomycin until level < 20 mg/dl Monitor renal function/need for dialysis, clinical status, C&S, and vancomycin levels   Height: 5\' 3"  (160 cm) Weight: 150 lb (68 kg) IBW/kg (Calculated) : 52.4  Temp (24hrs), Avg:101.3 F (38.5 C), Min:101.3 F (38.5 C), Max:101.3 F (38.5 C)  Recent Labs  Lab 02/18/2019 1455 02/28/2019 1511 02/10/2019 1512  WBC 15.4*  --   --   CREATININE 3.14* 2.60*  --   LATICACIDVEN 2.5*  --   --   VANCORANDOM  --   --  25    Estimated Creatinine Clearance: 15.7 mL/min (A) (by C-G formula based on SCr of 2.6 mg/dL (H)).    No Known Allergies  Antimicrobials this admission: Cefepime 10/5 >>  Vancomycin - started at [facility] unknown duration at this time >>   Thank you for allowing pharmacy to be a part of this patient's care.  Alanda Slim, PharmD, [facility] Clinical Pharmacist Please see AMION for all Pharmacists' Contact Phone Numbers 02/21/2019, 4:34 PM

## 2019-02-12 NOTE — ED Provider Notes (Signed)
[facility] EMERGENCY DEPARTMENT Provider Note   CSN: [id number] Arrival date & time: 03/10/2019  1411     History   Chief Complaint No chief complaint on file.   HPI Melissa Cochran is a 83 y.o. female who is status post CVA on trach and vent coming from [facility].  Her baseline mental status is withdrawing from pain only.  She still currently full code.  She is a past medical history of A. fib on apixaban, hypertension, coronary artery disease status post stent in 2011, chronic heart failure with EF of 40 to 50%.  Nephrolithiasis, complicated hydronephrosis, seizure disorder, chronic respiratory failure with vent dependence, status post PEG tube placement n.p.o. on tube feeding only. According to [facility] staff who transported the patient, the patient has had worsening lab values all week.  According to the labs at [facility] her BUN is 218, her potassium is 6.3, and her sodium was 159.  Repeat labs currently in process.  The patient has a known ischemic left foot.  She was admitted to the hospital back in late July for this.  She had a surgical consult and was deemed not to be a surgical candidate for her left gangrenous leg. family has apparently not decided what to do about this. She is on currently vancomycin.  For treatment of her gangrenous extremity.          Past Medical History:  Diagnosis Date  . Adnexal mass   . Anxiety   . Arthritis   . Bladder tumor   . Bleeding ulcer   . CHF (congestive heart failure) ([place])   . CHF (congestive heart failure) ([place])   . Chronic bronchitis ([place])   . Chronic respiratory failure ([place]) 04/19/2018  . COPD (chronic obstructive pulmonary disease) ([place])   . Coronary artery disease   . CVA (cerebral vascular accident) ([place]) 04/19/2018  . DVT (deep venous thrombosis) ([facility])    RLE  . GERD (gastroesophageal reflux disease)   . Heart block    left  . History of bleeding ulcers   . History of kidney stones   .  Hyperlipemia   . Hypertension   . Myocardial infarction ([place])    "slight one" (07/05/2017)  . Pneumonia    "several times" (07/05/2017)  . Seizures ([place]) 02/2017 "several"; 06/26/2017 X 1  . Shortness of breath dyspnea   . Stroke [facility]) 02/13/2018   "still right sided weakness" (07/05/2017)    Patient Active Problem List   Diagnosis Date Noted  . Ischemic toe 12/05/2018  . Critical lower limb ischemia 12/04/2018  . 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 Permanent vegetative state ([place]) 12/04/2018  . Pressure injury of skin 11/20/2018  . Fever 11/20/2018  . Bacteremia due to Klebsiella pneumoniae 11/13/2018  . UTI (urinary tract infection) 11/13/2018  . Sepsis ([place]) 11/12/2018  . Goals of care, counseling/discussion   . Palliative care by specialist   . Meningioma, cerebral ([place]) 09/27/2018  . Other emphysema ([place]) 09/27/2018  . Febrile illness, acute   . Severe sepsis with septic shock ([place]) 09/26/2018  . Hypernatremia 09/26/2018  . AKI (acute kidney injury) ([place]) 09/26/2018  . CVA (cerebral vascular accident) ([place]) 04/19/2018  . Chronic respiratory failure requiring continuous mechanical ventilation through tracheostomy ([place]) 04/19/2018  . E-coli UTI, ESBL  06/30/2017  . A-fib ([place]) 06/30/2017  . Need for protective airway ventilation 06/30/2017  . Seizures ([place]) 06/26/2017  . Pressure ulcer 02/17/2017  . Bilateral lower extremity edema 10/08/2016  .  Venous ulcer of ankle ([place]) 07/08/2016  . DVT (deep venous thrombosis) ([place]) 07/02/2016  . S/P coronary artery stent placement 10/30/2015  . 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 Mass of lower lobe of left lung, incidetnal on CT 09/2015, smoker.  09/29/2015  . Other microscopic hematuria 08/26/2015  . Urge incontinence 08/26/2015  . Essential (primary) hypertension 12/09/2014  . Adnexal mass 11/28/2013  . History of cardiac catheterization 11/28/2013  . Hyperlipidemia 11/28/2013    Past Surgical  History:  Procedure Laterality Date  . ABDOMINAL HYSTERECTOMY    . CARDIAC CATHETERIZATION Left 10/21/2015   Procedure: Left Heart Cath and Coronary Angiography;  Surgeon: Isaias Cowman, MD;  Location: [facility];  Service: Cardiovascular;  Laterality: Left;  . CARDIAC CATHETERIZATION N/A 10/21/2015   Procedure: Coronary Stent Intervention;  Surgeon: Isaias Cowman, MD;  Location: [facility];  Service: Cardiovascular;  Laterality: N/A;  . CARDIAC CATHETERIZATION    . CYSTOSCOPY W/ RETROGRADES Bilateral 01/20/2016   Procedure: CYSTOSCOPY WITH RETROGRADE PYELOGRAM;  Surgeon: Hollice Espy, MD;  Location: [facility];  Service: Urology;  Laterality: Bilateral;  . CYSTOSCOPY WITH STENT PLACEMENT Right 01/20/2016   Procedure: CYSTOSCOPY WITH STENT PLACEMENT;  Surgeon: Hollice Espy, MD;  Location: [facility] OR[facility];  Service: Urology;  Laterality: Right;  . HIP FRACTURE SURGERY Left   . STOMACH SURGERY     "stomach ulcers"  . TRACHEOSTOMY TUBE PLACEMENT N/A 03/02/2017   Procedure: TRACHEOSTOMY;  Surgeon: Rozetta Nunnery, MD;  Location: [facility];  Service: ENT;  Laterality: N/A;  . TRANSURETHRAL RESECTION OF BLADDER TUMOR N/A 01/20/2016   Procedure: TRANSURETHRAL RESECTION OF BLADDER TUMOR (TURBT);  Surgeon: Hollice Espy, MD;  Location: [facility];  Service: Urology;  Laterality: N/A;     OB History   No obstetric history on file.      Home Medications    Prior to Admission medications   Medication Sig Start Date End Date Taking? Authorizing Provider  acetaminophen (TYLENOL) 325 MG tablet Place 650 mg into feeding tube every 4 (four) hours as needed for mild pain.     [provider]  Amino Acids-Protein Hydrolys (FEEDING SUPPLEMENT, PRO-STAT SUGAR FREE 64,) LIQD Place 30 mLs into feeding tube 3 (three) times daily. 10/04/18   Erick Colace, NP  aspirin 81 MG chewable tablet Place 1 tablet (81 mg total) into feeding tube daily. 06/19/18   Cherene Altes, MD   chlorhexidine (PERIDEX) 0.12 % solution Use as directed 15 mLs in the mouth or throat See admin instructions. 15 ml's by starting shift  times a day    [provider]  diphenoxylate-atropine (LOMOTIL) 2.5-0.025 MG tablet Place 1 tablet into feeding tube 4 (four) times daily as needed for diarrhea or loose stools. 11/27/18   Arrien, Jimmy Picket, MD  enoxaparin (LOVENOX) 40 MG/0.4ML injection Inject 0.4 mLs (40 mg total) into the skin daily. 10/05/18   Erick Colace, NP  famotidine (PEPCID) 20 MG tablet Place 20 mg into feeding tube daily.     [provider]  furosemide (LASIX) 10 MG/ML injection Inject 40 mg into the vein once.    [provider]  HYDROcodone-acetaminophen (NORCO/VICODIN) 5-325 MG tablet Place 1 tablet into feeding tube every 6 (six) hours as needed for moderate pain. 12/05/18   Darliss Cheney, MD  levETIRAcetam (KEPPRA) 100 MG/ML solution Place 10 mLs (1,000 mg total) into feeding tube 2 (two) times daily. Patient taking differently: Place 1,000 mg into feeding tube  every 12 (twelve) hours.  06/19/18   Cherene Altes, MD  midodrine (PROAMATINE) 5 MG tablet Take 1 tablet (5 mg total) by mouth 3 (three) times daily with meals. Patient taking differently: Place 5 mg into feeding tube 3 (three) times daily with meals.  10/04/18   Erick Colace, NP  Multiple Vitamin (MULTIVITAMIN) LIQD Place 15 mLs into feeding tube daily. Patient taking differently: Place 15 mLs into feeding tube daily. Centrum 9 mg / iron 10/05/18   Erick Colace, NP  Nutritional Supplements (ISOSOURCE 1.5 CAL) LIQD 300 mLs by Enteral route continuous. 50 ml/hr with  1000 ml    [provider]  nystatin (MYCOSTATIN) 100000 UNIT/ML suspension Take 5 mLs (500,000 Units total) by mouth 4 (four) times daily. Patient taking differently: Use as directed 5 mLs in the mouth or throat every 4 (four) hours.  10/04/18   Erick Colace, NP  pantoprazole sodium (PROTONIX) 40 mg/20 mL  PACK Place 20 mLs (40 mg total) into feeding tube daily. 10/05/18   Erick Colace, NP  phenytoin (DILANTIN) 125 MG/5ML suspension Place 500 mg into feeding tube every 12 (twelve) hours.     [provider]  Skin Protectants, Misc. (CRITIC-AID EX) Apply 1 application topically daily. Apply to buttocks    [provider]  Sodium Chloride Flush (NORMAL SALINE FLUSH IV) Inject 10 mLs into the vein See admin instructions. Use 10 ml's via IV every shift for any used lumens- flush each unused lumen    [provider]  Valproate Sodium (DEPAKENE) 250 MG/5ML SOLN solution Place 5 mLs (250 mg total) into feeding tube 2 (two) times daily. Patient taking differently: Place 250 mg into feeding tube every 12 (twelve) hours.  06/19/18   Cherene Altes, MD  Water For Irrigation, Sterile (FREE WATER) SOLN Place 300 mLs into feeding tube every 4 (four) hours. 10/04/18   Erick Colace, NP    Family History Family History  Problem Relation Age of Onset  . Stomach cancer Mother   . CVA Father   . Bladder Cancer Neg Hx   . Kidney cancer Neg Hx     Social History Social History   Tobacco Use  . Smoking status: Former Smoker    Packs/day: 1.00    Years: 40.00    Pack years: 40.00    Quit date: 06/13/1998    Years since quitting: 20.6  . Smokeless tobacco: Never Used  Substance Use Topics  . Alcohol use: No  . Drug use: No     Allergies   Patient has no known allergies.   Review of Systems Review of Systems Unable to review systems as patient is nonverbal and vented  Physical Exam Updated Vital Signs Pulse (!) 116   SpO2 96%   Physical Exam Vitals signs and nursing note reviewed.  Constitutional:      Appearance: She is well-developed and underweight. She is ill-appearing and toxic-appearing. She is not diaphoretic.  HENT:     Head: Normocephalic and atraumatic.  Neck:     Musculoskeletal: Normal range of motion.  Cardiovascular:     Rate and Rhythm:  Normal rate and regular rhythm.     Heart sounds: Normal heart sounds. No murmur. No friction rub. No gallop.   Pulmonary:     Effort: Pulmonary effort is normal. No respiratory distress.     Breath sounds: Normal breath sounds.  Abdominal:     General: Bowel sounds are normal. There is no  distension.     Palpations: Abdomen is soft. There is no mass.     Tenderness: There is no abdominal tenderness. There is no guarding.  Musculoskeletal:     Comments: Left foot several necrotic, black and weeping and erythematous toes consistent with gangrene.  Left heel with deep decubitus ulcer  Skin:    General: Skin is warm and dry.  Neurological:     GCS: GCS eye subscore is 1. GCS verbal subscore is 1. GCS motor subscore is 4.  Psychiatric:        Behavior: Behavior normal.                ED Treatments / Results  Labs (all labs ordered are listed, but only abnormal results are displayed) Labs Reviewed  COMPREHENSIVE METABOLIC PANEL  CBC WITH DIFFERENTIAL/PLATELET  I-STAT CHEM 8, ED    EKG None  Radiology No results found.  Procedures .Critical Care Performed by: Margarita Mail, PA-C Authorized by: Margarita Mail, PA-C   Critical care provider statement:    Critical care time (minutes):  60   Critical care time was exclusive of:  Separately billable procedures and treating other patients   Critical care was necessary to treat or prevent imminent or life-threatening deterioration of the following conditions:  Sepsis and renal failure   Critical care was time spent personally by me on the following activities:  Discussions with consultants, evaluation of patient's response to treatment, examination of patient, ordering and performing treatments and interventions, ordering and review of laboratory studies, ordering and review of radiographic studies, pulse oximetry, re-evaluation of patient's condition, obtaining history from patient or surrogate and review of old charts    (including critical care time)  Medications Ordered in ED Medications - No data to display   Initial Impression / Assessment and Plan / ED Course  I have reviewed the triage vital signs and the nursing notes.  Pertinent labs & imaging results that were available during my care of the patient were reviewed by me and considered in my medical decision making (see chart for details).  Clinical Course as of Feb 11 1554  Mon Feb 12, 2019  1531 CBC with Differential(!) [AH]    Clinical Course User Index [AH] Margarita Mail, PA-C    I have reviewed the patient's labs which show acute renal failure.  She has marketed electrolyte abnormalities with a sodium of 158, potassium of 6.5, chloride of 127.  Patient's BUN is currently 227 with a creatinine of 3.14.  Albumin level 1.6.  I have ordered a emergent calcium chloride, insulin 5 units with dextrose and dextrose infusion.We will continue with Kayexalate therapy.  Patient's labs also show a lactic acidosis, white blood cell count of 15.4 thousand.    I personally reviewed the patient's tib-fib, foot and chest x-ray which shows no evidence of osteomyelitis .  Chest x-ray with new opacities in the right upper lobe suspicious for potential atypical pneumonia.  Patient's COVID test is pending.   Dr. Roslynn Amble spoke with our nephrologist on-call who states that the patient will not be candidate for dialysis however they will consult and offer medical management.  I spoke with Eliseo Gum, NP of critical care who is very familiar with this patient.  They have seen the patient in consult.  They will help to manage her vent settings but ask for a hospitalist admission.  I spoke with the [name] and made it clear that her mother is critically ill.  I asked about  DNR status and the patient's daughter states that at this moment she would like resuscitation efforts to be started should her heart stop until she can speak with her family and make a  shared decision..  I did explain to the patient that her kidneys are failing and she would not be a Dialysis candidate also explained that she is not a candidate for amputation of her gangrenous left leg.  She still maintains that she would like her to be resuscitated should her heart stop.  I have asked the patient to come as quickly as possible to be her by her mother's bedside so that she can make shared decision with the admitting teams here.  Final Clinical Impressions(s) / ED Diagnoses   Final diagnoses:  Sepsis, due to unspecified organism, unspecified whether acute organ dysfunction present [facility])  Acute renal failure, unspecified acute renal failure type ([place])  Hyperkalemia  Hypernatremia  Hyperchloremia  Uremia  Lactic acidosis  Gangrene ([place])  HCAP (healthcare-associated pneumonia)    ED Discharge Orders    None       Margarita Mail, PA-C 02/13/19 2113    Lucrezia Starch, MD 02/14/19 [phone]

## 2019-02-12 NOTE — H&P (Signed)
History and Physical    Melissa Cochran [id number] DOB: 16-Jun-1935 DOA: 03/06/2019  PCP: [name], MD  Patient coming from: [facility] I have personally briefly reviewed patient's old medical records in [place]  Chief Complaint: Worsening kidney function  HPI: Melissa Cochran is a 83 y.o. female with medical history significant of chronic A. fib on Eliquis, hypertension, coronary artery disease status post stent in [id number], chronic systolic heart failure with ejection fraction of 45 to 50% by echo on 06/28/2017, nephrolithiasis complicated by hydronephrosis status post stent placement in 2017, CVA, seizure disorder, chronic respiratory failure with ventilator dependence and PEG tube placement presented from [place] for the concern of worsening kidney function.  Patient is noncommunicative due to her medical issues.  History gathered from the charts.  Her baseline mental status is withdrawing from pain only.  Patient admitted on July/2020 for left ischemic-surgery was consulted who recommended no surgical intervention due to her medical issues.  Upon my evaluation: Patient has trach-on vent.  Not alert or oriented.  Mucous noted around the trach site.  Due to patient's poor prognosis discussed with PCCM (Dr. Vaughan Browner) for ICU admission-he personally discussed the CODE STATUS with patient's daughter who agreed with DNR.  Patient will be admitted at stepdown unit and PCCM will be consulted for the vent management.  ED Course: Upon arrival: Patient was febrile, tachycardic, leukocytosis, elevated lactic acid level, hyperkalemic and worsening of kidney function.  EDP consulted nephrology who states that patient will not be candidate for dialysis.  Patient was given Kayexalate via PEG tube and IV calcium gluconate, IV antibiotics cefepime, vancomycin and Flagyl.  Review of Systems: As per HPI otherwise negative.    Past Medical History:  Diagnosis Date  . Adnexal mass    . Anxiety   . Arthritis   . Bladder tumor   . Bleeding ulcer   . CHF (congestive heart failure) ([place])   . CHF (congestive heart failure) ([place])   . Chronic bronchitis ([place])   . Chronic respiratory failure ([place]) 04/19/2018  . COPD (chronic obstructive pulmonary disease) ([place])   . Coronary artery disease   . CVA (cerebral vascular accident) ([place]) 04/19/2018  . DVT (deep venous thrombosis) ([facility])    RLE  . GERD (gastroesophageal reflux disease)   . Heart block    left  . History of bleeding ulcers   . History of kidney stones   . Hyperlipemia   . Hypertension   . Myocardial infarction ([place])    "slight one" (07/05/2017)  . Pneumonia    "several times" (07/05/2017)  . Seizures ([place]) 02/2017 "several"; 06/26/2017 X 1  . Shortness of breath dyspnea   . Stroke [facility]) 02/13/2018   "still right sided weakness" (07/05/2017)    Past Surgical History:  Procedure Laterality Date  . ABDOMINAL HYSTERECTOMY    . CARDIAC CATHETERIZATION Left 10/21/2015   Procedure: Left Heart Cath and Coronary Angiography;  Surgeon: Isaias Cowman, MD;  Location: [facility];  Service: Cardiovascular;  Laterality: Left;  . CARDIAC CATHETERIZATION N/A 10/21/2015   Procedure: Coronary Stent Intervention;  Surgeon: Isaias Cowman, MD;  Location: [facility];  Service: Cardiovascular;  Laterality: N/A;  . CARDIAC CATHETERIZATION    . CYSTOSCOPY W/ RETROGRADES Bilateral 01/20/2016   Procedure: CYSTOSCOPY WITH RETROGRADE PYELOGRAM;  Surgeon: Hollice Espy, MD;  Location: [facility];  Service: Urology;  Laterality: Bilateral;  . CYSTOSCOPY WITH STENT PLACEMENT Right 01/20/2016   Procedure: CYSTOSCOPY WITH STENT PLACEMENT;  Surgeon: Hollice Espy, MD;  Location: [facility];  Service: Urology;  Laterality: Right;  . HIP FRACTURE SURGERY Left   . STOMACH SURGERY     "stomach ulcers"  . TRACHEOSTOMY TUBE PLACEMENT N/A 03/02/2017   Procedure: TRACHEOSTOMY;  Surgeon: Rozetta Nunnery, MD;   Location: [facility];  Service: ENT;  Laterality: N/A;  . TRANSURETHRAL RESECTION OF BLADDER TUMOR N/A 01/20/2016   Procedure: TRANSURETHRAL RESECTION OF BLADDER TUMOR (TURBT);  Surgeon: Hollice Espy, MD;  Location: [facility];  Service: Urology;  Laterality: N/A;     reports that she quit smoking about 20 years ago. She has a 40.00 pack-year smoking history. She has never used smokeless tobacco. She reports that she does not drink alcohol or use drugs.  No Known Allergies  Family History  Problem Relation Age of Onset  . Stomach cancer Mother   . CVA Father   . Bladder Cancer Neg Hx   . Kidney cancer Neg Hx     Prior to Admission medications   Medication Sig Start Date End Date Taking? Authorizing Provider  Amino Acids-Protein Hydrolys (FEEDING SUPPLEMENT, PRO-STAT SUGAR FREE 64,) LIQD Place 30 mLs into feeding tube 3 (three) times daily. 10/04/18  Yes Erick Colace, NP  aspirin 81 MG chewable tablet Place 1 tablet (81 mg total) into feeding tube daily. 06/19/18  Yes Joette Catching T, MD  ceFEPIme 1 g in sodium chloride 0.9 % 100 mL Inject 1 g into the vein every 8 (eight) hours.   Yes [provider]  diphenoxylate-atropine (LOMOTIL) 2.5-0.025 MG tablet Place 1 tablet into feeding tube 4 (four) times daily as needed for diarrhea or loose stools. 11/27/18  Yes Arrien, Jimmy Picket, MD  enoxaparin (LOVENOX) 40 MG/0.4ML injection Inject 0.4 mLs (40 mg total) into the skin daily. 10/05/18  Yes Erick Colace, NP  HYDROcodone-acetaminophen (NORCO/VICODIN) 5-325 MG tablet Place 1 tablet into feeding tube every 6 (six) hours as needed for moderate pain. 12/05/18  Yes Darliss Cheney, MD  levETIRAcetam (KEPPRA) 100 MG/ML solution Place 10 mLs (1,000 mg total) into feeding tube 2 (two) times daily. Patient taking differently: Place 1,000 mg into feeding tube every 12 (twelve) hours.  06/19/18  Yes Cherene Altes, MD  midodrine (PROAMATINE) 5 MG tablet Take 1 tablet (5 mg total) by mouth  3 (three) times daily with meals. Patient taking differently: Place 5 mg into feeding tube 3 (three) times daily with meals.  10/04/18  Yes Erick Colace, NP  Multiple Vitamin (MULTIVITAMIN) LIQD Place 15 mLs into feeding tube daily. Patient taking differently: Place 15 mLs into feeding tube daily. Centrum 9 mg / iron 10/05/18  Yes Erick Colace, NP  Nutritional Supplements (ISOSOURCE 1.5 CAL) LIQD 50 mLs by Enteral route continuous. 50 ml/hr with  1000 ml   Yes [provider]  pantoprazole sodium (PROTONIX) 40 mg/20 mL PACK Place 20 mLs (40 mg total) into feeding tube daily. 10/05/18  Yes Erick Colace, NP  phenytoin (DILANTIN) 125 MG/5ML suspension Place 500 mg into feeding tube every 12 (twelve) hours.    Yes [provider]  Sodium Chloride Flush (NORMAL SALINE FLUSH IV) Inject 10 mLs into the vein See admin instructions. Use 10 ml's via IV every shift for any used lumens- flush each unused lumen   Yes [provider]  Valproate Sodium (DEPAKENE) 250 MG/5ML SOLN solution Place 5 mLs (250 mg total) into feeding tube 2 (two) times daily. Patient taking differently: Place 250 mg into  feeding tube every 12 (twelve) hours.  06/19/18  Yes Joette Catching T, MD  vancomycin 1,000 mg in sodium chloride 0.9 % 250 mL Inject 1,000 mg into the vein daily.   Yes [provider]  chlorhexidine (PERIDEX) 0.12 % solution Use as directed 5 mLs in the mouth or throat See admin instructions.     [provider]    Physical Exam: Vitals:   02/09/2019 1715 02/08/2019 1730 02/20/2019 1740 02/18/2019 1915  BP: (!) 108/56 (!) 107/57  98/64  Pulse: (!) 117 (!) 115  (!) 119  Resp: (!) 39 (!) 39  (!) 31  Temp:      TempSrc:      SpO2: 93% 93% 95% 99%  Weight:      Height:        Constitutional: NAD, calm, comfortable Vitals:   03/09/2019 1715 02/11/2019 1730 02/11/2019 1740 02/14/2019 1915  BP: (!) 108/56 (!) 107/57  98/64  Pulse: (!) 117 (!) 115  (!) 119  Resp: (!) 39 (!)  39  (!) 31  Temp:      TempSrc:      SpO2: 93% 93% 95% 99%  Weight:      Height:       Constitutional: Not alert or oriented.  On vent.   Eyes: PERRL, lids and conjunctivae normal ENMT: Mucous membranes are moist. Posterior pharynx clear of any exudate or lesions.Normal dentition.  Neck: normal, supple, no masses, no thyromegaly Respiratory: Diffuse crackles noted bilaterally.   Cardiovascular: Regular rate and rhythm, no murmurs / rubs / gallops. No extremity edema. 2+ pedal pulses. No carotid bruits.  Abdomen: no tenderness, no masses palpated. No hepatosplenomegaly. Bowel sounds positive.  Musculoskeletal: PEG tube noted on left side of abdomen no clubbing / cyanosis. No joint deformity upper and lower extremities. Good ROM, no contractures. Normal muscle tone.  Skin:           Labs on Admission: I have personally reviewed following labs and imaging studies  CBC: Recent Labs  Lab 02/21/2019 1455 02/09/2019 1511 02/17/2019 1728  WBC 15.4*  --   --   NEUTROABS 12.6*  --   --   HGB 8.3* 8.2* 7.5*  HCT 30.4* 24.0* 22.0*  MCV 101.7*  --   --   PLT 172  --   --    Basic Metabolic Panel: Recent Labs  Lab 03/06/2019 1455 02/11/2019 1511 02/16/2019 1728  NA 158* 160* 158*  K 6.5* 6.3* 6.4*  CL 127* 129*  --   CO2 20*  --   --   GLUCOSE 162* 152*  --   BUN 227* >140*  --   CREATININE 3.14* 2.60*  --   CALCIUM 8.7*  --   --    GFR: Estimated Creatinine Clearance: 15.7 mL/min (A) (by C-G formula based on SCr of 2.6 mg/dL (H)). Liver Function Tests: Recent Labs  Lab 02/20/2019 1455  AST 65*  ALT 67*  ALKPHOS 96  BILITOT 1.2  PROT 6.5  ALBUMIN 1.6*   No results for input(s): LIPASE, AMYLASE in the last 168 hours. No results for input(s): AMMONIA in the last 168 hours. Coagulation Profile: Recent Labs  Lab 03/07/2019 1457  INR 1.9*   Cardiac Enzymes: No results for input(s): CKTOTAL, CKMB, CKMBINDEX, TROPONINI in the last 168 hours. BNP (last 3 results) No results  for input(s): PROBNP in the last 8760 hours. HbA1C: No results for input(s): HGBA1C in the last 72 hours. CBG: Recent Labs  Lab 02/14/2019  1449 03/01/2019 1936  GLUCAP 149* 200*   Lipid Profile: No results for input(s): CHOL, HDL, LDLCALC, TRIG, CHOLHDL, LDLDIRECT in the last 72 hours. Thyroid Function Tests: No results for input(s): TSH, T4TOTAL, FREET4, T3FREE, THYROIDAB in the last 72 hours. Anemia Panel: No results for input(s): VITAMINB12, FOLATE, FERRITIN, TIBC, IRON, RETICCTPCT in the last 72 hours. Urine analysis:    Component Value Date/Time   COLORURINE YELLOW 11/11/2018 2220   APPEARANCEUR TURBID (A) 11/11/2018 2220   APPEARANCEUR Cloudy (A) 01/29/2016 1126   LABSPEC 1.020 11/11/2018 2220   LABSPEC 1.020 01/23/2014 1047   PHURINE 7.5 11/11/2018 2220   GLUCOSEU NEGATIVE 11/11/2018 2220   GLUCOSEU NEGATIVE 01/23/2014 1047   HGBUR LARGE (A) 11/11/2018 2220   BILIRUBINUR SMALL (A) 11/11/2018 2220   BILIRUBINUR Negative 01/13/2016 1110   BILIRUBINUR NEGATIVE 01/23/2014 1047   KETONESUR NEGATIVE 11/11/2018 2220   PROTEINUR >300 (A) 11/11/2018 2220   UROBILINOGEN 0.2 08/04/2015 1140   NITRITE NEGATIVE 11/11/2018 2220   LEUKOCYTESUR MODERATE (A) 11/11/2018 2220   LEUKOCYTESUR TRACE 01/23/2014 1047    Radiological Exams on Admission: Dg Tibia/fibula Left  Result Date: 03/08/2019 CLINICAL DATA:  Infection EXAM: LEFT TIBIA AND FIBULA - 2 VIEW COMPARISON:  None. FINDINGS: Characterization of osseous detail is limited by diffuse osteopenia, however, there is no fracture line or displaced fracture fragment seen. No destructive change to suggest osteomyelitis. Probable benign bone apart within the distal tibia. Soft tissues about the LEFT tibia and fibula are unremarkable. Questionable soft tissue defect/ulcer lateral to the mid ulna. IMPRESSION: 1. No acute appearing osseous abnormality, with characterization slightly limited by osteopenia. No obvious evidence of osteomyelitis. 2.  Questionable soft tissue defect/ulcer lateral to the mid ulna. Electronically Signed   By: Franki Cabot M.D.   On: 02/10/2019 15:52   Dg Chest Port 1 View  Result Date: 02/16/2019 CLINICAL DATA:  Sepsis EXAM: PORTABLE CHEST 1 VIEW COMPARISON:  Chest x-rays dated 12/03/2017 and 09/30/2018. FINDINGS: Tracheostomy tube appears appropriately positioned above the level of the carina. LEFT-sided PICC line is well position with tip at the level of the lower SVC. Increased ill-defined nodular and interstitial opacities within the RIGHT upper lobe. Coarse lung markings are not significantly changed throughout the remainder of the lungs bilaterally. No pleural effusion or pneumothorax seen. Heart size and mediastinal contours appear stable. Osseous structures about the chest are unremarkable. IMPRESSION: 1. Increased ill-defined nodular and interstitial opacities within the RIGHT upper lobe, suspicious for atypical pneumonia, alternatively asymmetric edema. 2. Tracheostomy tube appears appropriately positioned above the level of the carina. 3. Coarse lung markings are not significantly changed throughout the remainder of the lungs bilaterally, likely indicating a chronic underlying interstitial lung disease. Electronically Signed   By: Franki Cabot M.D.   On: 02/26/2019 15:35   Dg Foot 2 Views Left  Result Date: 02/11/2019 CLINICAL DATA:  Infection EXAM: LEFT FOOT - 2 VIEW COMPARISON:  None. FINDINGS: Diffuse osteopenia limits characterization of osseous detail, however, there is no fracture line or displaced fracture fragment identified. No destructive change seen to confirm an osteomyelitis. Presumed soft tissue defect overlying the posterior margin of the calcaneus. IMPRESSION: 1. No acute findings. No osseous fracture or dislocation seen. No evidence of osteomyelitis. 2. Presumed soft tissue defect overlying the posterior margin of the calcaneus, presumably a soft tissue ulcer. Electronically Signed   By: Franki Cabot M.D.   On: 03/01/2019 15:40    EKG: Tachycardic, no acute ST-T wave changes noted.  Assessment/Plan Principal  Problem:   Acute on chronic renal failure (HCC) Active Problems:   Hyperlipidemia   Venous ulcer of ankle (HCC)   Pressure ulcer   Seizure disorder (HCC)   Chronic respiratory failure requiring continuous mechanical ventilation through tracheostomy ([place])   Hypernatremia   AKI (acute kidney injury) ([place])   Sepsis due to pneumonia (HCC)   Tracheostomy dependence (HCC)   Ischemic toe   Hyperkalemia   Macrocytic anemia   S/P percutaneous endoscopic gastrostomy (PEG) tube placement ([place])   Sepsis secondary pneumonia:  -Patient febrile, tachycardic, leukocytosis, lactic acid elevated.   -Chest x-ray shows increased ill-defined nodular and interstitial opacity within the right upper lobe, suspicious for atypical pneumonia -Received IV vancomycin, cefepime and Flagyl in ED.  Blood culture is obtained.   -Ordered procalcitonin level, continue IV Vanco and cefepime.   -We will check urine Legionella, urine strep pneumo antigen  -Continue D5W.  Monitor vitals closely.  acute kidney injury: -Likely secondary to dehydration.  Given her baseline status patient is not a good candidate for dialysis as per nephrology. -Continue IV fluids and avoid nephrotoxic medication. -Renal ultrasound is pending.  Hyperkalemia: -Secondary to acute kidney injury. -Received IV calcium gluconate, regular insulin 5 units along with dextrose In ED.  Kayexalate given via PEG tube. -Monitor BMP very closely.  No EKG changes. -On telemetry.  Hyponatremia: Likely secondary to dehydration -Start on D5W by nephrology.  Avoid normal saline. -Monitor BMP closely.  Chronic vent dependent respiratory failure: -PCCM consulted for vent management.  Ischemic left great toe: -Not a candidate for surgery due to her medical status. -Continue aspirin.  Seizure disorder: -Continue phenytoin and  Keppra  Microcytic anemia: -H&H is currently stable.  -Monitor for now.  Patient is critically ill with multiple organ system failure she is on high risk for morbidity and mortality.PCCM-Dr. Daleen Snook detailed discussion with patient's daughter who agreed with DNR and palliative care consult.  DVT prophylaxis: TED/SCD/Lovenox Code Status: DNR Family Communication: None present at bedside.  Plan of care discussed with patient in length and he verbalized understanding and agreed with it. Disposition Plan: TBD Consults called: PCCM, nephrology Admission status: Inpatient  Mckinley Jewel MD [facility] Pager [phone]  If 7PM-7AM, please contact night-coverage [email or web address] Password [email or web address]  02/09/2019, 7:58 PM

## 2019-02-12 NOTE — ED Notes (Signed)
Per MD, D10 was d/c'd and D5 is now infusing at 75 ml/hr. Will continue to monitor pt.

## 2019-02-12 NOTE — ED Notes (Signed)
Messaged MD about 2nd TPA order for Picc line. Will notify IV team when a response is given.

## 2019-02-12 NOTE — ED Triage Notes (Signed)
Pt from [facility] via [place] for evaluation of possible dialysis.  Pt has abnl labs as follows BUN-218, NA-159, K-6.6.  Pt has a necrotic left foot.  Pt is ventilated and has a trach.  Pt does not have dialysis access and is a full code.

## 2019-02-12 NOTE — Progress Notes (Signed)
Notified bedside nurse of need to draw repeat lactic acid. 

## 2019-02-12 NOTE — Consult Note (Signed)
[facility] Renal Consultation Note  Requesting MD: Dykstra Indication for Consultation:  AKI   Chief complaint: worsening labs at OSH  HPI:  Melissa Cochran is a 83 y.o. female with a history of chronic respiratory failure, atrial fibrillation, hypertension, CAD, and known hydronephrosis who presented to the hospital from [place] after worsening AKI and hyperkalemia and hypernatremia noted there.  Labs from [facility] include BUN of 218 potassium 6.3 sodium 159.  Note that at baseline the patient withdraws from painful stimuli only.  She is also done trach/vent dependent and has a PEG.  Note that palliative care has been consulted.  On discussion with critical care, her daughter did state that she was not aware of how ill her mother was and would not want to put her through something such as CPR.  She is ordered for NS - has been on D10.  Creatinine  Date/Time Value Ref Range Status  01/23/2014 11:25 AM 0.72 0.60 - 1.30 mg/dL Final  09/09/2013 10:39 AM 0.67 0.60 - 1.30 mg/dL Final  10/22/2012 03:10 PM 0.58 (L) 0.60 - 1.30 mg/dL Final  10/26/2011 03:04 PM 0.68 0.60 - 1.30 mg/dL Final   Creatinine, Ser  Date/Time Value Ref Range Status  02/14/2019 03:11 PM 2.60 (H) 0.44 - 1.00 mg/dL Final  03/02/2019 02:55 PM 3.14 (H) 0.44 - 1.00 mg/dL Final  12/05/2018 04:17 AM 0.51 0.44 - 1.00 mg/dL Final  12/04/2018 10:19 AM 0.52 0.44 - 1.00 mg/dL Final  11/29/2018 06:14 AM 0.56 0.44 - 1.00 mg/dL Final  11/26/2018 12:29 PM 0.65 0.44 - 1.00 mg/dL Final  11/25/2018 04:28 AM 0.52 0.44 - 1.00 mg/dL Final  11/24/2018 03:48 AM 0.51 0.44 - 1.00 mg/dL Final  11/23/2018 03:33 AM 0.52 0.44 - 1.00 mg/dL Final  11/22/2018 05:22 PM 0.61 0.44 - 1.00 mg/dL Final  11/22/2018 09:30 AM 0.53 0.44 - 1.00 mg/dL Final  11/22/2018 04:04 AM 0.53 0.44 - 1.00 mg/dL Final  11/20/2018 04:04 AM 0.50 0.44 - 1.00 mg/dL Final  11/19/2018 04:30 AM 0.55 0.44 - 1.00 mg/dL Final  11/18/2018 04:35 AM 0.53 0.44 - 1.00  mg/dL Final  11/18/2018 04:35 AM 0.61 0.44 - 1.00 mg/dL Final  11/17/2018 04:02 AM 0.68 0.44 - 1.00 mg/dL Final  11/16/2018 04:59 AM 0.65 0.44 - 1.00 mg/dL Final  11/15/2018 05:36 AM 0.95 0.44 - 1.00 mg/dL Final  11/14/2018 05:56 AM 1.31 (H) 0.44 - 1.00 mg/dL Final  11/13/2018 08:05 AM 1.29 (H) 0.44 - 1.00 mg/dL Final  11/12/2018 04:14 PM 1.55 (H) 0.44 - 1.00 mg/dL Final  11/12/2018 02:54 AM 1.49 (H) 0.44 - 1.00 mg/dL Final  11/11/2018 10:36 PM 1.68 (H) 0.44 - 1.00 mg/dL Final  10/02/2018 04:20 AM 0.32 (L) 0.44 - 1.00 mg/dL Final  10/01/2018 04:21 AM 0.36 (L) 0.44 - 1.00 mg/dL Final  09/30/2018 03:53 AM 0.51 0.44 - 1.00 mg/dL Final  09/29/2018 05:33 AM 0.46 0.44 - 1.00 mg/dL Final  09/28/2018 03:56 AM 0.57 0.44 - 1.00 mg/dL Final  09/27/2018 02:55 PM 0.62 0.44 - 1.00 mg/dL Final  09/27/2018 04:21 AM 0.70 0.44 - 1.00 mg/dL Final  09/26/2018 05:49 PM 0.79 0.44 - 1.00 mg/dL Final  08/25/2018 03:14 AM 0.58 0.44 - 1.00 mg/dL Final  08/24/2018 05:00 AM 0.56 0.44 - 1.00 mg/dL Final  08/23/2018 11:20 AM 0.50 0.44 - 1.00 mg/dL Final  08/23/2018 02:20 AM 0.46 0.44 - 1.00 mg/dL Final  08/22/2018 02:42 AM 0.50 0.44 - 1.00 mg/dL Final  08/21/2018 04:11 AM 0.41 (L) 0.44 - 1.00 mg/dL  Final  08/20/2018 05:25 AM 0.42 (L) 0.44 - 1.00 mg/dL Final  08/19/2018 05:47 AM 0.48 0.44 - 1.00 mg/dL Final  08/17/2018 04:39 AM 0.52 0.44 - 1.00 mg/dL Final  08/16/2018 03:12 AM 0.58 0.44 - 1.00 mg/dL Final  08/15/2018 03:30 AM 0.68 0.44 - 1.00 mg/dL Final  08/14/2018 07:01 AM 0.87 0.44 - 1.00 mg/dL Final  08/13/2018 02:59 AM 1.04 (H) 0.44 - 1.00 mg/dL Final  08/12/2018 06:56 PM 1.03 (H) 0.44 - 1.00 mg/dL Final  06/24/2018 03:21 AM 0.40 (L) 0.44 - 1.00 mg/dL Final  06/18/2018 05:00 AM 0.33 (L) 0.44 - 1.00 mg/dL Final  06/17/2018 08:03 AM 0.38 (L) 0.44 - 1.00 mg/dL Final  06/17/2018 04:31 AM 0.36 (L) 0.44 - 1.00 mg/dL Final  06/16/2018 05:13 AM 0.39 (L) 0.44 - 1.00 mg/dL Final  06/15/2018 04:11 AM 0.39 (L) 0.44 -  1.00 mg/dL Final     PMHx:   Past Medical History:  Diagnosis Date  . Adnexal mass   . Anxiety   . Arthritis   . Bladder tumor   . Bleeding ulcer   . CHF (congestive heart failure) ([place])   . CHF (congestive heart failure) ([place])   . Chronic bronchitis ([place])   . Chronic respiratory failure ([place]) 04/19/2018  . COPD (chronic obstructive pulmonary disease) ([place])   . Coronary artery disease   . CVA (cerebral vascular accident) ([place]) 04/19/2018  . DVT (deep venous thrombosis) ([facility])    RLE  . GERD (gastroesophageal reflux disease)   . Heart block    left  . History of bleeding ulcers   . History of kidney stones   . Hyperlipemia   . Hypertension   . Myocardial infarction ([place])    "slight one" (07/05/2017)  . Pneumonia    "several times" (07/05/2017)  . Seizures ([place]) 02/2017 "several"; 06/26/2017 X 1  . Shortness of breath dyspnea   . Stroke [facility]) 02/13/2018   "still right sided weakness" (07/05/2017)    Past Surgical History:  Procedure Laterality Date  . ABDOMINAL HYSTERECTOMY    . CARDIAC CATHETERIZATION Left 10/21/2015   Procedure: Left Heart Cath and Coronary Angiography;  Surgeon: Isaias Cowman, MD;  Location: [place] [facility];  Service: Cardiovascular;  Laterality: Left;  . CARDIAC CATHETERIZATION N/A 10/21/2015   Procedure: Coronary Stent Intervention;  Surgeon: Isaias Cowman, MD;  Location: [place] [facility];  Service: Cardiovascular;  Laterality: N/A;  . CARDIAC CATHETERIZATION    . CYSTOSCOPY W/ RETROGRADES Bilateral 01/20/2016   Procedure: CYSTOSCOPY WITH RETROGRADE PYELOGRAM;  Surgeon: Hollice Espy, MD;  Location: [facility];  Service: Urology;  Laterality: Bilateral;  . CYSTOSCOPY WITH STENT PLACEMENT Right 01/20/2016   Procedure: CYSTOSCOPY WITH STENT PLACEMENT;  Surgeon: Hollice Espy, MD;  Location: [facility] OR[facility];  Service: Urology;  Laterality: Right;  . HIP FRACTURE SURGERY Left   . STOMACH SURGERY     "stomach ulcers"  . TRACHEOSTOMY TUBE  PLACEMENT N/A 03/02/2017   Procedure: TRACHEOSTOMY;  Surgeon: Rozetta Nunnery, MD;  Location: [place];  Service: ENT;  Laterality: N/A;  . TRANSURETHRAL RESECTION OF BLADDER TUMOR N/A 01/20/2016   Procedure: TRANSURETHRAL RESECTION OF BLADDER TUMOR (TURBT);  Surgeon: Hollice Espy, MD;  Location: [facility] OR[facility];  Service: Urology;  Laterality: N/A;    Family Hx:  Family History  Problem Relation Age of Onset  . Stomach cancer Mother   . CVA Father   . Bladder Cancer Neg Hx   . Kidney cancer Neg Hx     Social History:  reports that she quit smoking about 20 years ago. She has a 40.00 pack-year smoking history. She has never used smokeless tobacco. She reports that she does not drink alcohol or use drugs.  Allergies: No Known Allergies  Medications: Prior to Admission medications   Medication Sig Start Date End Date Taking? Authorizing Provider  Amino Acids-Protein Hydrolys (FEEDING SUPPLEMENT, PRO-STAT SUGAR FREE 64,) LIQD Place 30 mLs into feeding tube 3 (three) times daily. 10/04/18  Yes Erick Colace, NP  aspirin 81 MG chewable tablet Place 1 tablet (81 mg total) into feeding tube daily. 06/19/18  Yes Joette Catching T, MD  ceFEPIme 1 g in sodium chloride 0.9 % 100 mL Inject 1 g into the vein every 8 (eight) hours.   Yes [provider]  diphenoxylate-atropine (LOMOTIL) 2.5-0.025 MG tablet Place 1 tablet into feeding tube 4 (four) times daily as needed for diarrhea or loose stools. 11/27/18  Yes Arrien, Jimmy Picket, MD  enoxaparin (LOVENOX) 40 MG/0.4ML injection Inject 0.4 mLs (40 mg total) into the skin daily. 10/05/18  Yes Erick Colace, NP  HYDROcodone-acetaminophen (NORCO/VICODIN) 5-325 MG tablet Place 1 tablet into feeding tube every 6 (six) hours as needed for moderate pain. 12/05/18  Yes Darliss Cheney, MD  levETIRAcetam (KEPPRA) 100 MG/ML solution Place 10 mLs (1,000 mg total) into feeding tube 2 (two) times daily. Patient taking differently: Place 1,000 mg into  feeding tube every 12 (twelve) hours.  06/19/18  Yes Cherene Altes, MD  midodrine (PROAMATINE) 5 MG tablet Take 1 tablet (5 mg total) by mouth 3 (three) times daily with meals. Patient taking differently: Place 5 mg into feeding tube 3 (three) times daily with meals.  10/04/18  Yes Erick Colace, NP  Multiple Vitamin (MULTIVITAMIN) LIQD Place 15 mLs into feeding tube daily. Patient taking differently: Place 15 mLs into feeding tube daily. Centrum 9 mg / iron 10/05/18  Yes Erick Colace, NP  Nutritional Supplements (ISOSOURCE 1.5 CAL) LIQD 50 mLs by Enteral route continuous. 50 ml/hr with  1000 ml   Yes [provider]  pantoprazole sodium (PROTONIX) 40 mg/20 mL PACK Place 20 mLs (40 mg total) into feeding tube daily. 10/05/18  Yes Erick Colace, NP  phenytoin (DILANTIN) 125 MG/5ML suspension Place 500 mg into feeding tube every 12 (twelve) hours.    Yes [provider]  Sodium Chloride Flush (NORMAL SALINE FLUSH IV) Inject 10 mLs into the vein See admin instructions. Use 10 ml's via IV every shift for any used lumens- flush each unused lumen   Yes [provider]  Valproate Sodium (DEPAKENE) 250 MG/5ML SOLN solution Place 5 mLs (250 mg total) into feeding tube 2 (two) times daily. Patient taking differently: Place 250 mg into feeding tube every 12 (twelve) hours.  06/19/18  Yes Joette Catching T, MD  vancomycin 1,000 mg in sodium chloride 0.9 % 250 mL Inject 1,000 mg into the vein daily.   Yes [provider]  chlorhexidine (PERIDEX) 0.12 % solution Use as directed 5 mLs in the mouth or throat See admin instructions.     [provider]    I have reviewed the patient's current medications.  Labs:  BMP Latest Ref Rng & Units 03/07/2019 03/08/2019 03/01/2019  Glucose 70 - 99 mg/dL - 152(H) 162(H)  BUN 8 - 23 mg/dL - >140(H) 227(H)  Creatinine 0.44 - 1.00 mg/dL - 2.60(H) 3.14(H)  BUN/Creat Ratio 12 - 28 - - -  Sodium 135 - 145 mmol/L 158(H)  160(H) 158(H)  Potassium 3.5 - 5.1 mmol/L 6.4(HH) 6.3(HH) 6.5(HH)  Chloride 98 - 111 mmol/L - 129(H) 127(H)  CO2 22 - 32 mmol/L - - 20(L)  Calcium 8.9 - 10.3 mg/dL - - 8.7(L)    Urinalysis    Component Value Date/Time   COLORURINE YELLOW 11/11/2018 2220   APPEARANCEUR TURBID (A) 11/11/2018 2220   APPEARANCEUR Cloudy (A) 01/29/2016 1126   LABSPEC 1.020 11/11/2018 2220   LABSPEC 1.020 01/23/2014 1047   PHURINE 7.5 11/11/2018 2220   GLUCOSEU NEGATIVE 11/11/2018 2220   GLUCOSEU NEGATIVE 01/23/2014 1047   HGBUR LARGE (A) 11/11/2018 2220   BILIRUBINUR SMALL (A) 11/11/2018 2220   BILIRUBINUR Negative 01/13/2016 [place] 01/23/2014 1047   KETONESUR NEGATIVE 11/11/2018 2220   PROTEINUR >300 (A) 11/11/2018 2220   UROBILINOGEN 0.2 08/04/2015 1140   NITRITE NEGATIVE 11/11/2018 2220   LEUKOCYTESUR MODERATE (A) 11/11/2018 2220   LEUKOCYTESUR TRACE 01/23/2014 1047     ROS: Unable to obtain secondary to patient baseline   Physical Exam: Vitals:   03/06/2019 1730 02/20/2019 1740  BP: (!) 107/57   Pulse: (!) 115   Resp: (!) 39   Temp:    SpO2: 93% 95%     General: elderly female chronically ill appearing HEENT: trach in place  Eyes: does not open eyes to exam  Heart:  Tachycardic  Lungs: coarse mechanical breath sounds; on vent Abdomen: softly distended; unable to assess tenderness  Extremities:  No edema appreciated  Skin: no cyanosis or clubbing  Neuro: does not open eyes to exam; nonverbal   Assessment/Plan:  # Hyperkalemia - Setting of AKI  - Requested Kayexalate   # AKI - Pre-renal insults possible with PEG status, free water deficit  - Given her baseline functional status, the patient is not a candidate for dialysis initiation - Continue supportive care - start hypotonic IV fluids - note reported hx of hydronephrosis - renal ultrasound  - place foley   # Hypernatremia - Free water deficit  - Discontinue normal saline and transition to D5W  #  Chronic hypoxic resp failure - patient with trach in place  # Anemia - macrocytic  - suspect a chronic component - no acute indication for PRBC's  Would recommend continued goals of care discussions.  Not a candidate for dialysis from a renal standpoint.  Claudia Desanctis 02/11/2019, 7:05 PM

## 2019-02-13 DIAGNOSIS — Z7189 Other specified counseling: Secondary | ICD-10-CM

## 2019-02-13 DIAGNOSIS — Z515 Encounter for palliative care: Secondary | ICD-10-CM

## 2019-02-13 LAB — COMPREHENSIVE METABOLIC PANEL
ALT: 61 U/L — ABNORMAL HIGH (ref 0–44)
AST: 48 U/L — ABNORMAL HIGH (ref 15–41)
Albumin: 1.6 g/dL — ABNORMAL LOW (ref 3.5–5.0)
Alkaline Phosphatase: 102 U/L (ref 38–126)
Anion gap: 19 — ABNORMAL HIGH (ref 5–15)
BUN: 234 mg/dL — ABNORMAL HIGH (ref 8–23)
CO2: 17 mmol/L — ABNORMAL LOW (ref 22–32)
Calcium: 8.7 mg/dL — ABNORMAL LOW (ref 8.9–10.3)
Chloride: 124 mmol/L — ABNORMAL HIGH (ref 98–111)
Creatinine, Ser: 3.32 mg/dL — ABNORMAL HIGH (ref 0.44–1.00)
GFR calc Af Amer: 14 mL/min — ABNORMAL LOW (ref >60)
GFR calc non Af Amer: 12 mL/min — ABNORMAL LOW (ref >60)
Glucose, Bld: 143 mg/dL — ABNORMAL HIGH (ref 70–99)
Potassium: 6.4 mmol/L (ref 3.5–5.1)
Sodium: 160 mmol/L — ABNORMAL HIGH (ref 135–145)
Total Bilirubin: 1.2 mg/dL (ref 0.3–1.2)
Total Protein: 6.1 g/dL — ABNORMAL LOW (ref 6.5–8.1)

## 2019-02-13 LAB — BASIC METABOLIC PANEL
Anion gap: 19 — ABNORMAL HIGH (ref 5–15)
BUN: 225 mg/dL — ABNORMAL HIGH (ref 8–23)
CO2: 18 mmol/L — ABNORMAL LOW (ref 22–32)
Calcium: 8.6 mg/dL — ABNORMAL LOW (ref 8.9–10.3)
Chloride: 124 mmol/L — ABNORMAL HIGH (ref 98–111)
Creatinine, Ser: 3.56 mg/dL — ABNORMAL HIGH (ref 0.44–1.00)
GFR calc Af Amer: 13 mL/min — ABNORMAL LOW (ref >60)
GFR calc non Af Amer: 11 mL/min — ABNORMAL LOW (ref >60)
Glucose, Bld: 142 mg/dL — ABNORMAL HIGH (ref 70–99)
Potassium: 5.3 mmol/L — ABNORMAL HIGH (ref 3.5–5.1)
Sodium: 161 mmol/L (ref 135–145)

## 2019-02-13 LAB — CORTISOL-AM, BLOOD: Cortisol - AM: 13 ug/dL (ref 6.7–22.6)

## 2019-02-13 LAB — CBC
HCT: 27.3 % — ABNORMAL LOW (ref 36.0–46.0)
Hemoglobin: 7.4 g/dL — ABNORMAL LOW (ref 12.0–15.0)
MCH: 28.6 pg (ref 26.0–34.0)
MCHC: 27.1 g/dL — ABNORMAL LOW (ref 30.0–36.0)
MCV: 105.4 fL — ABNORMAL HIGH (ref 80.0–100.0)
Platelets: 165 10*3/uL (ref 150–400)
RBC: 2.59 MIL/uL — ABNORMAL LOW (ref 3.87–5.11)
RDW: 16.7 % — ABNORMAL HIGH (ref 11.5–15.5)
WBC: 14 10*3/uL — ABNORMAL HIGH (ref 4.0–10.5)
nRBC: 0.1 % (ref 0.0–0.2)

## 2019-02-13 LAB — PROCALCITONIN: Procalcitonin: 15.94 ng/mL

## 2019-02-13 LAB — LACTIC ACID, PLASMA: Lactic Acid, Venous: 3 mmol/L (ref 0.5–1.9)

## 2019-02-13 LAB — PROTIME-INR
INR: 1.8 — ABNORMAL HIGH (ref 0.8–1.2)
Prothrombin Time: 20.5 seconds — ABNORMAL HIGH (ref 11.4–15.2)

## 2019-02-13 LAB — VANCOMYCIN, RANDOM: Vancomycin Rm: 22

## 2019-02-13 LAB — GLUCOSE, CAPILLARY
Glucose-Capillary: 107 mg/dL — ABNORMAL HIGH (ref 70–99)
Glucose-Capillary: 116 mg/dL — ABNORMAL HIGH (ref 70–99)
Glucose-Capillary: 113 mg/dL — ABNORMAL HIGH (ref 70–99)
Glucose-Capillary: 112 mg/dL — ABNORMAL HIGH (ref 70–99)

## 2019-02-13 LAB — MRSA PCR SCREENING: MRSA by PCR: NEGATIVE

## 2019-02-13 MED ORDER — SODIUM BICARBONATE 8.4 % IV SOLN
INTRAVENOUS | Status: AC
  Filled 2019-02-13: qty 50

## 2019-02-13 MED ORDER — ALTEPLASE 2 MG IJ SOLR
2.0000 mg | Freq: Once | INTRAMUSCULAR | Status: DC
  Filled 2019-02-13: qty 2

## 2019-02-13 MED ORDER — LEVETIRACETAM 100 MG/ML PO SOLN
500.0000 mg | Freq: Every day | ORAL | Status: DC
  Administered 2019-02-14 – 2019-02-15 (×2): 500 mg
  Filled 2019-02-13 (×2): qty 10

## 2019-02-13 MED ORDER — COLLAGENASE 250 UNIT/GM EX OINT
TOPICAL_OINTMENT | Freq: Every day | CUTANEOUS | Status: DC
  Administered 2019-02-13 – 2019-02-15 (×3): via TOPICAL
  Filled 2019-02-13: qty 30

## 2019-02-13 MED ORDER — LACTATED RINGERS IV BOLUS
500.0000 mL | Freq: Once | INTRAVENOUS | Status: AC
  Administered 2019-02-13: 500 mL via INTRAVENOUS

## 2019-02-13 MED ORDER — SODIUM POLYSTYRENE SULFONATE 15 GM/60ML PO SUSP
45.0000 g | Freq: Once | ORAL | Status: AC
  Administered 2019-02-13: 45 g via ORAL
  Filled 2019-02-13: qty 180

## 2019-02-13 MED ORDER — ENOXAPARIN SODIUM 30 MG/0.3ML ~~LOC~~ SOLN
30.0000 mg | SUBCUTANEOUS | Status: DC
  Administered 2019-02-13: 30 mg via SUBCUTANEOUS
  Filled 2019-02-13 (×2): qty 0.3

## 2019-02-13 MED ORDER — SODIUM POLYSTYRENE SULFONATE 15 GM/60ML PO SUSP
30.0000 g | Freq: Once | ORAL | Status: AC
  Administered 2019-02-13: 18:00:00 30 g
  Filled 2019-02-13: qty 120

## 2019-02-13 MED ORDER — SODIUM BICARBONATE 8.4 % IV SOLN
50.0000 meq | Freq: Once | INTRAVENOUS | Status: AC
  Administered 2019-02-13: 15:00:00 50 meq via INTRAVENOUS
  Filled 2019-02-13: qty 50

## 2019-02-13 MED ORDER — ENOXAPARIN SODIUM 40 MG/0.4ML ~~LOC~~ SOLN
30.0000 mg | SUBCUTANEOUS | Status: DC
  Filled 2019-02-13: qty 0.4

## 2019-02-13 MED ORDER — FREE WATER
150.0000 mL | Status: DC
  Administered 2019-02-13 – 2019-02-15 (×15): 150 mL

## 2019-02-13 NOTE — ED Notes (Signed)
Notified MD in regards for a second order of TPA for PICC line flush.

## 2019-02-13 NOTE — Progress Notes (Signed)
Patient transported from ED room 27 to [id number] with no complications.

## 2019-02-13 NOTE — Progress Notes (Signed)
[facility] Progress Note  Name: Melissa Cochran MRN: [id number] DOB: 10-Nov-1935  Chief Complaint:  Worsening labs   Subjective:  Critical care has signed off.  Patient has been made DNR.  Palliative is consulted.   Review of systems:  Unable to obtain secondary to AMS ----------------------------------- Background on consult:  Melissa Cochran is a 83 y.o. female with a history of chronic respiratory failure, atrial fibrillation, hypertension, CAD, and known hydronephrosis who presented to the hospital from [place] after worsening AKI and hyperkalemia and hypernatremia noted there.  Labs from [facility] include BUN of 218 potassium 6.3 sodium 159.  Note that at baseline the patient withdraws from painful stimuli only.  She is also trach/vent dependent and has a PEG.  She has baseline of being nonverbal.  Note that palliative care has been consulted.  On discussion with critical care, her daughter did state that she was not aware of how ill her mother was and would not want to put her through something such as CPR.  She is ordered for NS - has been on D10.   Intake/Output Summary (Last 24 hours) at 02/13/2019 1118 Last data filed at 02/13/2019 1000 Gross per 24 hour  Intake 1166.7 ml  Output -  Net 1166.7 ml    Vitals:  Vitals:   02/13/19 0945 02/13/19 1000 02/13/19 1005 02/13/19 1030  BP:  (!) 78/47 (!) 90/56 (!) 73/52  Pulse: (!) 101 81 93 92  Resp: (!) 35 (!) 34  (!) 35  Temp:      TempSrc:      SpO2: 100% 100%  99%  Weight:      Height:         Physical Exam:  General elderly female in bed critically ill  HEENT does not open eyes to exam; trach in place Neck no JVD trachea midline Lungs coarse mechanical breaths sounds; on vent   Heart tachycardic; no murmurs  Abdomen soft nondistended; PEG Extremities no pitting edema  Neuro - intermittently withdraws; does not open eyes to exam; no continuous sedation   Medications reviewed   Labs:  BMP  Latest Ref Rng & Units 02/13/2019 02/14/2019 02/18/2019  Glucose 70 - 99 mg/dL 143(H) - 152(H)  BUN 8 - 23 mg/dL 234(H) - >140(H)  Creatinine 0.44 - 1.00 mg/dL 3.32(H) - 2.60(H)  BUN/Creat Ratio 12 - 28 - - -  Sodium 135 - 145 mmol/L 160(H) 158(H) 160(H)  Potassium 3.5 - 5.1 mmol/L 6.4(HH) 6.4(HH) 6.3(HH)  Chloride 98 - 111 mmol/L 124(H) - 129(H)  CO2 22 - 32 mmol/L 17(L) - -  Calcium 8.9 - 10.3 mg/dL 8.7(L) - -   Renal US:  1. Negative for hydronephrosis. 2. 8 mm stone in the lower pole left kidney. 3. 1.4 cm echogenic mass within the cortex of the lower pole left kidney, no corresponding abnormality on previous CT from July, this may represent a new finding. Follow-up CT or MRI may be obtained when clinically feasible. 4. Complex cystic mass with thickened septa in the left adnexa measuring up to 6.3 cm, corresponding to CT demonstrated complex cystic adnexal mass indeterminate for neoplasm.   Assessment/Plan:   # Hyperkalemia - Setting of AKI  - Repeat kayexalate and bicarbonate  - Refractory and not a dialysis candidate   # AKI - ATN with hypotension as well as pre-renal insults possible with PEG status, free water deficit.  No hydro on renal ultrasound.  Note multiple incidental mass lesions noted on renal ultrasound -  Given her baseline functional status, the patient is not a candidate for dialysis initiation - Can continue hypotonic IV fluids  - would place foley    # Hypernatremia - Free water deficit  - on D5 - increase rate and add enteric free water boluses  # Chronic hypoxic resp failure - patient with trach in place  # Anemia - macrocytic  - suspect a chronic component - PRBC's per primary team discretion    # Metabolic acidosis  - Secondary to AKI - Not a dialysis candidate   Recommend a transition to palliative care from a renal standpoint.  Spoke with primary team and they are ok with nephrology signing off.     Claudia Desanctis, MD 02/13/2019 11:18  AM

## 2019-02-13 NOTE — Progress Notes (Signed)
   Patient is full DNR Pall care pending  D/w Dr Cherylann Ratel - pccm can sign off     SIGNATURE    Dr. Brand Males, M.D., F.C.C.P,  Pulmonary and Critical Care Medicine Staff Physician, [place] Director - Interstitial Lung Disease  Program  [facility] [place], [place], [address]  Pager: [phone], If no answer or between  15:00h - 7:00h: call [phone] Telephone: [phone]  9:17 AM 02/13/2019

## 2019-02-13 NOTE — Progress Notes (Signed)
Patient transported from ED room 47 to ED room 27 with no complicaitons.

## 2019-02-13 NOTE — ED Notes (Addendum)
ED TO INPATIENT HANDOFF REPORT  ED Nurse Name and Phone #:   S Name/Age/Gender Melissa Cochran 83 y.o. female Room/Bed: 027C/027C  Code Status   Code Status: DNR  Home/SNF/Other Skilled nursing facility Patient oriented to: non-verbal  Is this baseline? Yes   Triage Complete: Triage complete  Chief Complaint elevated potass  [facility] for evaluation of possible dialysis.  Pt has abnl labs as follows BUN-218, NA-159, K-6.6.  Pt has a necrotic left foot.  Pt is ventilated and has a trach.  Pt does not have dialysis access and is a full code.   Allergies No Known Allergies  Level of Care/Admitting Diagnosis ED Disposition    ED Disposition Condition [facility] Area: [place] [[id number]]  Level of Care: Progressive [102]  Covid Evaluation: Asymptomatic Screening Protocol (No Symptoms)  Diagnosis: Acute on chronic renal failure [facility]) [[id number]]  Admitting Physician: Mckinley Jewel [phone]  Attending Physician: Mckinley Jewel [phone]  Estimated length of stay: 3 - 4 days  Certification:: I certify this patient will need inpatient services for at least 2 midnights  PT Class (Do Not Modify): Inpatient [101]  PT Acc Code (Do Not Modify): Private [1]       B Medical/Surgery History Past Medical History:  Diagnosis Date  . Adnexal mass   . Anxiety   . Arthritis   . Bladder tumor   . Bleeding ulcer   . CHF (congestive heart failure) ([place])   . CHF (congestive heart failure) ([place])   . Chronic bronchitis ([place])   . Chronic respiratory failure ([place]) 04/19/2018  . COPD (chronic obstructive pulmonary disease) ([place])   . Coronary artery disease   . CVA (cerebral vascular accident) ([place]) 04/19/2018  . DVT (deep venous thrombosis) ([facility])    RLE  . GERD (gastroesophageal reflux disease)   . Heart block    left  . History of bleeding ulcers   . History of kidney stones   . Hyperlipemia   .  Hypertension   . Myocardial infarction ([place])    "slight one" (07/05/2017)  . Pneumonia    "several times" (07/05/2017)  . Seizures ([place]) 02/2017 "several"; 06/26/2017 X 1  . Shortness of breath dyspnea   . Stroke [facility]) 02/13/2018   "still right sided weakness" (07/05/2017)   Past Surgical History:  Procedure Laterality Date  . ABDOMINAL HYSTERECTOMY    . CARDIAC CATHETERIZATION Left 10/21/2015   Procedure: Left Heart Cath and Coronary Angiography;  Surgeon: Isaias Cowman, MD;  Location: [place] [facility];  Service: Cardiovascular;  Laterality: Left;  . CARDIAC CATHETERIZATION N/A 10/21/2015   Procedure: Coronary Stent Intervention;  Surgeon: Isaias Cowman, MD;  Location: [place] [facility];  Service: Cardiovascular;  Laterality: N/A;  . CARDIAC CATHETERIZATION    . CYSTOSCOPY W/ RETROGRADES Bilateral 01/20/2016   Procedure: CYSTOSCOPY WITH RETROGRADE PYELOGRAM;  Surgeon: Hollice Espy, MD;  Location: [facility] OR[facility];  Service: Urology;  Laterality: Bilateral;  . CYSTOSCOPY WITH STENT PLACEMENT Right 01/20/2016   Procedure: CYSTOSCOPY WITH STENT PLACEMENT;  Surgeon: Hollice Espy, MD;  Location: [facility] OR[facility];  Service: Urology;  Laterality: Right;  . HIP FRACTURE SURGERY Left   . STOMACH SURGERY     "stomach ulcers"  . TRACHEOSTOMY TUBE PLACEMENT N/A 03/02/2017   Procedure: TRACHEOSTOMY;  Surgeon: Rozetta Nunnery, MD;  Location: [place];  Service: ENT;  Laterality: N/A;  . TRANSURETHRAL RESECTION OF BLADDER TUMOR N/A 01/20/2016   Procedure:  TRANSURETHRAL RESECTION OF BLADDER TUMOR (TURBT);  Surgeon: Hollice Espy, MD;  Location: [facility] OR[facility];  Service: Urology;  Laterality: N/A;     A IV Location/Drains/Wounds Patient Lines/Drains/Airways Status   Active Line/Drains/Airways    Name:   Placement date:   Placement time:   Site:   Days:   Peripheral IV 02/16/2019 Right Forearm   03/07/2019    1600    Forearm   1   PICC Double Lumen 11/24/18 PICC Left  44 cm 0 cm   11/24/18    1715      81   Gastrostomy/Enterostomy Gastrostomy LUQ   04/25/18    0800    LUQ   294   Rectal Tube/Pouch   12/05/18    1430    -   70   External Urinary Catheter   11/14/18    0745    -   91   Tracheostomy Portex 8 mm Cuffed   -    -    8 mm      Tracheostomy 9 mm Cuffed   -    -    9 mm      Pressure Injury 11/19/18 Heel Left Unstageable - Full thickness tissue loss in which the base of the ulcer is covered by slough (yellow, tan, gray, green or brown) and/or eschar (tan, brown or black) in the wound bed.   11/19/18    0800     86   Pressure Injury 11/19/18 Heel Right Unstageable - Full thickness tissue loss in which the base of the ulcer is covered by slough (yellow, tan, gray, green or brown) and/or eschar (tan, brown or black) in the wound bed.   11/19/18    0800     86   Pressure Injury 11/19/18 Sacrum Mid Stage III -  Full thickness tissue loss. Subcutaneous fat may be visible but bone, tendon or muscle are NOT exposed.   11/19/18    0800     86   Pressure Injury 11/19/18 Vertebral column Mid;Medial Stage III -  Full thickness tissue loss. Subcutaneous fat may be visible but bone, tendon or muscle are NOT exposed.   11/19/18    0800     86   Pressure Injury 11/19/18 Foot Left;Lateral Unstageable - Full thickness tissue loss in which the base of the ulcer is covered by slough (yellow, tan, gray, green or brown) and/or eschar (tan, brown or black) in the wound bed.   11/19/18    0800     86          Intake/Output Last 24 hours  Intake/Output Summary (Last 24 hours) at 02/13/2019 0539 Last data filed at 02/19/2019 2317 Gross per 24 hour  Intake 537.81 ml  Output -  Net 537.81 ml    Labs/Imaging Results for orders placed or performed during the hospital encounter of 03/10/2019 (from the past 48 hour(s))  CBG monitoring, ED     Status: Abnormal   Collection Time: 02/13/2019  2:49 PM  Result Value Ref Range   Glucose-Capillary 149 (H) 70 - 99 mg/dL  Comprehensive metabolic panel     Status:  Abnormal   Collection Time: 02/14/2019  2:55 PM  Result Value Ref Range   Sodium 158 (H) 135 - 145 mmol/L   Potassium 6.5 (HH) 3.5 - 5.1 mmol/L    Comment: CRITICAL RESULT CALLED TO, READ BACK BY AND VERIFIED WITH: S.CRUZ,RN 1548 03/08/2019 CLARK,S    Chloride 127 (H) 98 -  111 mmol/L   CO2 20 (L) 22 - 32 mmol/L   Glucose, Bld 162 (H) 70 - 99 mg/dL   BUN 227 (H) 8 - 23 mg/dL   Creatinine, Ser 3.14 (H) 0.44 - 1.00 mg/dL   Calcium 8.7 (L) 8.9 - 10.3 mg/dL   Total Protein 6.5 6.5 - 8.1 g/dL   Albumin 1.6 (L) 3.5 - 5.0 g/dL   AST 65 (H) 15 - 41 U/L   ALT 67 (H) 0 - 44 U/L   Alkaline Phosphatase 96 38 - 126 U/L   Total Bilirubin 1.2 0.3 - 1.2 mg/dL   GFR calc non Af Amer 13 (L) >60 mL/min   GFR calc Af Amer 15 (L) >60 mL/min   Anion gap 11 5 - 15    Comment: Performed at [facility], [address], [place], [place] [address]  CBC with Differential     Status: Abnormal   Collection Time: 02/21/2019  2:55 PM  Result Value Ref Range   WBC 15.4 (H) 4.0 - 10.5 K/uL   RBC 2.99 (L) 3.87 - 5.11 MIL/uL   Hemoglobin 8.3 (L) 12.0 - 15.0 g/dL   HCT 30.4 (L) 36.0 - 46.0 %   MCV 101.7 (H) 80.0 - 100.0 fL   MCH 27.8 26.0 - 34.0 pg   MCHC 27.3 (L) 30.0 - 36.0 g/dL   RDW 16.8 (H) 11.5 - 15.5 %   Platelets 172 150 - 400 K/uL   nRBC 0.2 0.0 - 0.2 %   Neutrophils Relative % 83 %   Neutro Abs 12.6 (H) 1.7 - 7.7 K/uL   Lymphocytes Relative 12 %   Lymphs Abs 1.9 0.7 - 4.0 K/uL   Monocytes Relative 2 %   Monocytes Absolute 0.3 0.1 - 1.0 K/uL   Eosinophils Relative 2 %   Eosinophils Absolute 0.3 0.0 - 0.5 K/uL   Basophils Relative 0 %   Basophils Absolute 0.0 0.0 - 0.1 K/uL   Immature Granulocytes 1 %   Abs Immature Granulocytes 0.21 (H) 0.00 - 0.07 K/uL    Comment: Performed at [facility] [address], [place], [place] [address]  Lactic acid, plasma     Status: Abnormal   Collection Time: 02/26/2019  2:55 PM  Result Value Ref Range   Lactic Acid, Venous 2.5 (HH) 0.5 - 1.9 mmol/L     Comment: CRITICAL RESULT CALLED TO, READ BACK BY AND VERIFIED WITH: A.GORTON,RN 1532 02/24/2019 CLARK,S Performed at [facility], [place] [address], [place], [place] [address]   APTT     Status: Abnormal   Collection Time: 02/13/2019  2:57 PM  Result Value Ref Range   aPTT 46 (H) 24 - 36 seconds    Comment:        IF BASELINE aPTT IS ELEVATED, SUGGEST PATIENT RISK ASSESSMENT BE USED TO DETERMINE APPROPRIATE ANTICOAGULANT THERAPY. Performed at [facility], [place] [address] [place], [place] [address]   Protime-INR     Status: Abnormal   Collection Time: 02/27/2019  2:57 PM  Result Value Ref Range   Prothrombin Time 21.5 (H) 11.4 - 15.2 seconds   INR 1.9 (H) 0.8 - 1.2    Comment: (NOTE) INR goal varies based on device and disease states. Performed at [facility], [place] [address], [place], [place] [address]   I-stat chem 8, ED (not at [facility] or [facility])     Status: Abnormal   Collection Time: 02/13/2019  3:11 PM  Result Value Ref Range   Sodium 160 (H) 135 - 145 mmol/L   Potassium 6.3 (HH) 3.5 - 5.1 mmol/L   Chloride 129 (H) 98 - 111 mmol/L   BUN >140 (H) 8 - 23 mg/dL   Creatinine, Ser 2.60 (H) 0.44 - 1.00 mg/dL   Glucose, Bld 152 (H) 70 - 99 mg/dL   Calcium, Ion 1.22 1.15 - 1.40 mmol/L   TCO2 22 22 - 32 mmol/L   Hemoglobin 8.2 (L) 12.0 - 15.0 g/dL   HCT 24.0 (L) 36.0 - 46.0 %   Comment NOTIFIED PHYSICIAN   Vancomycin, random     Status: None   Collection Time: 02/13/2019  3:12 PM  Result Value Ref Range   Vancomycin Rm 25     Comment:        Random Vancomycin therapeutic range is dependent on dosage and time of specimen collection. A peak range is 20.0-40.0 ug/mL A trough range is 5.0-15.0 ug/mL        Performed at [facility] [address] [place], [place] [address]   SARS Coronavirus 2 [facility] order, Performed in [facility] hospital lab) Nasopharyngeal Nasopharyngeal Swab     Status: None   Collection Time: 03/02/2019  4:59 PM   Specimen:  Nasopharyngeal Swab  Result Value Ref Range   SARS Coronavirus 2 NEGATIVE NEGATIVE    Comment: (NOTE) If result is NEGATIVE SARS-CoV-2 target nucleic acids are NOT DETECTED. The SARS-CoV-2 RNA is generally detectable in upper and lower  respiratory specimens during the acute phase of infection. The lowest  concentration of SARS-CoV-2 viral copies this assay can detect is 250  copies / mL. A negative result does not preclude SARS-CoV-2 infection  and should not be used as the sole basis for treatment or other  patient management decisions.  A negative result may occur with  improper specimen collection / handling, submission of specimen other  than nasopharyngeal swab, presence of viral mutation(s) within the  areas targeted by this assay, and inadequate number of viral copies  (<250 copies / mL). A negative result must be combined with clinical  observations, patient history, and epidemiological information. If result is POSITIVE SARS-CoV-2 target nucleic acids are DETECTED. The SARS-CoV-2 RNA is generally detectable in upper and lower  respiratory specimens dur ing the acute phase of infection.  Positive  results are indicative of active infection with SARS-CoV-2.  Clinical  correlation with patient history and other diagnostic information is  necessary to determine patient infection status.  Positive results do  not rule out bacterial infection or co-infection with other viruses. If result is PRESUMPTIVE POSTIVE SARS-CoV-2 nucleic acids MAY BE PRESENT.   A presumptive positive result was obtained on the submitted specimen  and confirmed on repeat testing.  While 2019 novel coronavirus  (SARS-CoV-2) nucleic acids may be present in the submitted sample  additional confirmatory testing may be necessary for epidemiological  and / or clinical management purposes  to differentiate between  SARS-CoV-2 and other Sarbecovirus currently known to infect humans.  If clinically indicated  additional testing with an alternate test  methodology [phone]) is advised. The SARS-CoV-2 RNA is generally  detectable in upper and lower respiratory sp ecimens during the acute  phase of infection. The expected result is Negative. Fact Sheet for Patients:  StrictlyIdeas.no Fact Sheet for Healthcare Providers: BankingDealers.co.za This test is not yet approved or cleared by the [place] FDA and has been authorized for detection and/or diagnosis of SARS-CoV-2 by FDA  under an Emergency Use Authorization (EUA).  This EUA will remain in effect (meaning this test can be used) for the duration of the COVID-19 declaration under Section 564(b)(1) of the Act, 21 U.S.C. section 360bbb-3(b)(1), unless the authorization is terminated or revoked sooner. Performed at [facility], [place] [address], [place], [place] [address]   I-STAT 7, (LYTES, BLD GAS, ICA, H+H)     Status: Abnormal   Collection Time: 03/08/2019  5:28 PM  Result Value Ref Range   pH, Arterial 7.254 (L) 7.350 - 7.450   pCO2 arterial 43.6 32.0 - 48.0 mmHg   pO2, Arterial 72.0 (L) 83.0 - 108.0 mmHg   Bicarbonate 19.0 (L) 20.0 - 28.0 mmol/L   TCO2 20 (L) 22 - 32 mmol/L   O2 Saturation 90.0 %   Acid-base deficit 7.0 (H) 0.0 - 2.0 mmol/L   Sodium 158 (H) 135 - 145 mmol/L   Potassium 6.4 (HH) 3.5 - 5.1 mmol/L   Calcium, Ion 1.24 1.15 - 1.40 mmol/L   HCT 22.0 (L) 36.0 - 46.0 %   Hemoglobin 7.5 (L) 12.0 - 15.0 g/dL   Patient temperature 101.1 F    Collection site RADIAL, ALLEN'S TEST ACCEPTABLE    Drawn by RT    Sample type ARTERIAL    Comment NOTIFIED PHYSICIAN   CBG monitoring, ED     Status: Abnormal   Collection Time: 03/04/2019  7:36 PM  Result Value Ref Range   Glucose-Capillary 200 (H) 70 - 99 mg/dL  Vancomycin, random     Status: None   Collection Time: 02/13/19  4:35 AM  Result Value Ref Range   Vancomycin Rm 22     Comment:        Random Vancomycin  therapeutic range is dependent on dosage and time of specimen collection. A peak range is 20.0-40.0 ug/mL A trough range is 5.0-15.0 ug/mL        Performed at [facility] [address] [place], [place] [address]   Protime-INR     Status: Abnormal   Collection Time: 02/13/19  4:35 AM  Result Value Ref Range   Prothrombin Time 20.5 (H) 11.4 - 15.2 seconds   INR 1.8 (H) 0.8 - 1.2    Comment: (NOTE) INR goal varies based on device and disease states. Performed at [facility], [place] [address], [place], [place] [address]   Cortisol-am, blood     Status: None   Collection Time: 02/13/19  4:35 AM  Result Value Ref Range   Cortisol - AM 13.0 6.7 - 22.6 ug/dL    Comment: Performed at [facility], [place] [address], [place], [place] [address]  CBC     Status: Abnormal   Collection Time: 02/13/19  4:35 AM  Result Value Ref Range   WBC 14.0 (H) 4.0 - 10.5 K/uL   RBC 2.59 (L) 3.87 - 5.11 MIL/uL   Hemoglobin 7.4 (L) 12.0 - 15.0 g/dL   HCT 27.3 (L) 36.0 - 46.0 %   MCV 105.4 (H) 80.0 - 100.0 fL   MCH 28.6 26.0 - 34.0 pg   MCHC 27.1 (L) 30.0 - 36.0 g/dL   RDW 16.7 (H) 11.5 - 15.5 %   Platelets 165 150 - 400 K/uL   nRBC 0.1 0.0 - 0.2 %    Comment: Performed at [facility] [address] [place], [place] [address]   Dg Tibia/fibula Left  Result Date: 02/26/2019 CLINICAL DATA:  Infection EXAM: LEFT  TIBIA AND FIBULA - 2 VIEW COMPARISON:  None. FINDINGS: Characterization of osseous detail is limited by diffuse osteopenia, however, there is no fracture line or displaced fracture fragment seen. No destructive change to suggest osteomyelitis. Probable benign bone apart within the distal tibia. Soft tissues about the LEFT tibia and fibula are unremarkable. Questionable soft tissue defect/ulcer lateral to the mid ulna. IMPRESSION: 1. No acute appearing osseous abnormality, with characterization slightly limited by osteopenia. No obvious evidence of osteomyelitis. 2.  Questionable soft tissue defect/ulcer lateral to the mid ulna. Electronically Signed   By: Franki Cabot M.D.   On: 02/22/2019 15:52   US Renal  Result Date: 02/11/2019 CLINICAL DATA:  Acute renal failure EXAM: RENAL / URINARY TRACT ULTRASOUND COMPLETE COMPARISON:  02/20/2017, CT 11/12/2018 FINDINGS: Right Kidney: Renal measurements: 11 x 6.1 x 7.5 cm = volume: 261.5 mL . Echogenicity within normal limits. No mass or hydronephrosis visualized. Left Kidney: Renal measurements: 12.3 x 6.4 x 5.9 cm = volume: 242.8 mL. Cortical echogenicity normal. No hydronephrosis. 8 mm shadowing stone lower pole left kidney. Small echogenic mass within the cortex of the lower pole measuring 1.4 x 1.2 x 0.8 cm. Bladder: Appears normal for degree of bladder distention. Complex cystic mass within the left adnexa measures 6.2 x 5.2 x 6.3 cm. Color flow present within thickened septa. IMPRESSION: 1. Negative for hydronephrosis. 2. 8 mm stone in the lower pole left kidney. 3. 1.4 cm echogenic mass within the cortex of the lower pole left kidney, no corresponding abnormality on previous CT from July, this may represent a new finding. Follow-up CT or MRI may be obtained when clinically feasible. 4. Complex cystic mass with thickened septa in the left adnexa measuring up to 6.3 cm, corresponding to CT demonstrated complex cystic adnexal mass indeterminate for neoplasm. Electronically Signed   By: Donavan Foil M.D.   On: 02/11/2019 21:56   Dg Chest Port 1 View  Result Date: 03/09/2019 CLINICAL DATA:  Sepsis EXAM: PORTABLE CHEST 1 VIEW COMPARISON:  Chest x-rays dated 12/03/2017 and 09/30/2018. FINDINGS: Tracheostomy tube appears appropriately positioned above the level of the carina. LEFT-sided PICC line is well position with tip at the level of the lower SVC. Increased ill-defined nodular and interstitial opacities within the RIGHT upper lobe. Coarse lung markings are not significantly changed throughout the remainder of the lungs  bilaterally. No pleural effusion or pneumothorax seen. Heart size and mediastinal contours appear stable. Osseous structures about the chest are unremarkable. IMPRESSION: 1. Increased ill-defined nodular and interstitial opacities within the RIGHT upper lobe, suspicious for atypical pneumonia, alternatively asymmetric edema. 2. Tracheostomy tube appears appropriately positioned above the level of the carina. 3. Coarse lung markings are not significantly changed throughout the remainder of the lungs bilaterally, likely indicating a chronic underlying interstitial lung disease. Electronically Signed   By: Franki Cabot M.D.   On: 02/22/2019 15:35   Dg Foot 2 Views Left  Result Date: 02/11/2019 CLINICAL DATA:  Infection EXAM: LEFT FOOT - 2 VIEW COMPARISON:  None. FINDINGS: Diffuse osteopenia limits characterization of osseous detail, however, there is no fracture line or displaced fracture fragment identified. No destructive change seen to confirm an osteomyelitis. Presumed soft tissue defect overlying the posterior margin of the calcaneus. IMPRESSION: 1. No acute findings. No osseous fracture or dislocation seen. No evidence of osteomyelitis. 2. Presumed soft tissue defect overlying the posterior margin of the calcaneus, presumably a soft tissue ulcer. Electronically Signed   By: Franki Cabot M.D.   On: 02/11/2019  15:40    Pending Labs Unresulted Labs (From admission, onward)    Start     Ordered   02/13/19 0500  Comprehensive metabolic panel  Tomorrow morning,   R     03/07/2019 1813   02/28/2019 1953  Legionella Pneumophila Serogp 1 Ur Ag  Once,   STAT     02/23/2019 1952   02/09/2019 1953  Strep pneumoniae urinary antigen  Once,   STAT     02/09/2019 1952   02/16/2019 1835  Lactic acid, plasma  Once,   STAT     02/17/2019 1834   03/06/2019 1814  Procalcitonin  Once,   STAT     03/06/2019 1813   02/18/2019 1457  Urinalysis, Routine w reflex microscopic  ONCE - STAT,   STAT     02/24/2019 1458   02/11/2019 1457  Urine  culture  ONCE - STAT,   STAT     02/28/2019 1458   02/14/2019 1426  Blood culture (routine x 2)  BLOOD CULTURE X 2,   STAT     02/26/2019 1425          Vitals/Pain Today's Vitals   02/13/19 0400 02/13/19 0415 02/13/19 0500 02/13/19 0515  BP: (!) 102/59  (!) 103/54   Pulse: 92 93 (!) 107 77  Resp: (!) 28 (!) 26 (!) 29 (!) 29  Temp:      TempSrc:      SpO2: 99% 97% 100% 100%  Weight:      Height:        Isolation Precautions No active isolations  Medications Medications  vancomycin variable dose per unstable renal function (pharmacist dosing) (has no administration in time range)  calcium gluconate inj 10% (1 g) URGENT USE ONLY! (1 g Intravenous Not Given 03/01/2019 2321)  ceFEPIme (MAXIPIME) 1 g in sodium chloride 0.9 % 100 mL IVPB (has no administration in time range)  sodium chloride flush (NS) 0.9 % injection 10-40 mL (20 mLs Intracatheter Given 02/09/2019 2317)  sodium chloride flush (NS) 0.9 % injection 10-40 mL (has no administration in time range)  Chlorhexidine Gluconate Cloth 2 % PADS 6 each (6 each Topical Not Given 02/27/2019 2330)  famotidine (PEPCID) IVPB 20 mg premix (20 mg Intravenous Not Given 02/09/2019 2332)  chlorhexidine gluconate (MEDLINE KIT) (PERIDEX) 0.12 % solution 15 mL (15 mLs Mouth Rinse Not Given 03/09/2019 2322)  MEDLINE mouth rinse (15 mLs Mouth Rinse Not Given 02/13/19 0358)  ondansetron (ZOFRAN) tablet 4 mg (has no administration in time range)    Or  ondansetron (ZOFRAN) injection 4 mg (has no administration in time range)  aspirin chewable tablet 81 mg (81 mg Per Tube Given 02/11/2019 2256)  midodrine (PROAMATINE) tablet 5 mg (has no administration in time range)  pantoprazole sodium (PROTONIX) 40 mg/20 mL oral suspension 40 mg (40 mg Per Tube Not Given 02/08/2019 2332)  levETIRAcetam (KEPPRA) 100 MG/ML solution 1,000 mg (1,000 mg Per Tube Given 02/27/2019 2336)  Valproate Sodium (DEPAKENE) solution 250 mg (250 mg Per Tube Given 02/09/2019 2336)  phenytoin (DILANTIN) 125  MG/5ML suspension 300 mg (300 mg Per Tube Given 03/02/2019 2336)  dextrose 5 % solution ( Intravenous New Bag/Given 02/11/2019 2111)  ceFEPIme (MAXIPIME) 2 g in sodium chloride 0.9 % 100 mL IVPB (0 g Intravenous Stopped 02/11/2019 1632)  metroNIDAZOLE (FLAGYL) IVPB 500 mg (0 mg Intravenous Stopped 02/25/2019 1703)  dextrose 10 % infusion ( Intravenous Stopped 02/13/2019 2101)  insulin aspart (novoLOG) injection 5 Units (5 Units Intravenous Given 03/08/2019  1722)    And  dextrose 50 % solution 50 mL (50 mLs Intravenous Given 02/18/2019 1722)  sodium polystyrene (KAYEXALATE) 15 GM/60ML suspension 30 g (30 g Per Tube Given 02/16/2019 2303)  alteplase (CATHFLO ACTIVASE) injection 2 mg (2 mg Intracatheter Given 02/19/2019 1656)    Mobility non-ambulatory     Focused Assessments Cardiac Assessment Handoff:  Cardiac Rhythm: Junctional rhythm Lab Results  Component Value Date   CKTOTAL 5 (L) 02/21/2017   TROPONINI 0.09 (HH) 08/13/2018   Lab Results  Component Value Date   DDIMER 5.10 (H) 08/13/2018   Does the Patient currently have chest pain? No     R Recommendations: See Admitting Provider Note  Report given to:   Additional Notes: 83 y.o. female with medical history significant of chronic A. fib on Eliquis, hypertension, coronary artery disease status post stent in 8588, chronic systolic heart failure with ejection fraction of 45 to 50% by echo on 06/28/2017, nephrolithiasis complicated by hydronephrosis status post stent placement in 2017, CVA, seizure disorder, chronic respiratory failure with ventilator dependence and PEG tube placement presented from [facility]  for the concern of worsening kidney function.   Upon arrival: Patient was febrile, tachycardic, leukocytosis, elevated lactic acid level, hyperkalemic and worsening of kidney function.  EDP consulted nephrology who states that patient will not be candidate for dialysis.  Patient was given Kayexalate via PEG tube and IV calcium gluconate, IV  antibiotics cefepime, vancomycin and Flagyl.

## 2019-02-13 NOTE — Progress Notes (Signed)
CRITICAL VALUE ALERT  Critical Value:  Sodium 161  Date & Time Notied:  02/13/2019 at 1400  Provider Notified: Dr. Marylyn Ishihara  Orders Received/Actions taken: No new orders given.

## 2019-02-13 NOTE — Consult Note (Signed)
Consultation Note Date: 02/13/2019   Patient Name: Melissa Cochran  DOB: [birth year]  MRN: [id number]  Age / Sex: 83 y.o., female  PCP: Melissa Patch, MD Referring Physician: Jonnie Finner, DO  Reason for Consultation: Establishing goals of care  HPI/Patient Profile: 83 y.o. female  with past medical history of chronic respiratory failure with trach and ventilator dependence, PEG dependence, a fib on eliquis, HTN, CAD, sys HF, hydronephrosis, CVA, seizures,  admitted on 02/27/2019 with abnormal labs/concern of worsening kidney function. Patient has had multiple hospitalizations and multiple palliative medicine consults - in the past, daughter has always requested full code/sull scope interventions despite recommendations to consider other approaches to her care. Patient is non-communicative and does not respond to stimuli.  Patient is being treated for sepsis secondary to pna. She also has significant AKI and hyperkalemia - not a dialysis candidate. She has an ischemic left great toe and multiple wounds to legs and sacrum. She was evaluated for leg amputation earlier this year but was deemed to not be a candidate/?daughter declined surgery. This admission, daughter has agreed to DNR status given the severity of her illness. PMT consulted to continue [place] conversations.   Clinical Assessment and Goals of Care: I have reviewed medical records including EPIC notes, labs and imaging, and received report from RN - RN reports that patient is unresponsive, remains dependent on vent, is hypotensive despite fluid boluses, remains unresponsive, and given kayexalate this AM for hyperkalemia.   I then spoke with patient's daughter, Melissa Cochran,  to discuss diagnosis prognosis, [place], EOL wishes, disposition and options.  Melissa Cochran is familiar with palliative medicine from previous hospitalizations. We reviewed our role in [name]'s care.  During our initial  conversation - Melissa Cochran shares that she is at work (she is a Dance movement psychotherapist) and is unable to participate in extended conversation unless the patient is acutely declining. We reviewed the situation - I shared about the care [name] is receiving, we discussed her hypotension and worsening renal function based on AM labs.   Melissa Cochran tells me she is agreeable to discuss further goals of care but asks if she can call back during a break. Number provided that she can call me back on later today.   Unfortunately, Melissa Cochran has not called back and I have been unable to further discuss goals of care today. Chart has been reviewed - Noted continued hypotension and worsening kidney function. I will continue to attempt to reach [facility] regarding goals of care.    Primary Decision Maker NEXT OF KIN - Melissa Cochran    SUMMARY OF RECOMMENDATIONS   Melissa Cochran declined further discussion about goals of care today as she was working but agreed to speak when she had time - told me she would call me when time allowed - will plan on reaching out to her again as further goals of care need to be defined  Code Status/Advance Care Planning:  DNR  Prognosis:   Very poor prognosis   Discharge Planning: To Be Determined      Primary Diagnoses: Present on Admission: . Acute on chronic renal failure ([place]) . Hyperlipidemia . Venous ulcer of ankle ([place]) . Pressure ulcer . Hypernatremia . AKI (acute kidney injury) ([place]) . Ischemic toe . Sepsis due to pneumonia ([place]) . Hyperkalemia . Macrocytic anemia   I have reviewed the medical record, interviewed the patient and family, and examined the patient. The following aspects are pertinent.  Past Medical History:  Diagnosis Date  . Adnexal  mass   . Anxiety   . Arthritis   . Bladder tumor   . Bleeding ulcer   . CHF (congestive heart failure) ([place])   . CHF (congestive heart failure) ([place])   . Chronic bronchitis ([place])   . Chronic respiratory failure ([place])  04/19/2018  . COPD (chronic obstructive pulmonary disease) ([place])   . Coronary artery disease   . CVA (cerebral vascular accident) ([place]) 04/19/2018  . DVT (deep venous thrombosis) ([facility])    RLE  . GERD (gastroesophageal reflux disease)   . Heart block    left  . History of bleeding ulcers   . History of kidney stones   . Hyperlipemia   . Hypertension   . Myocardial infarction ([place])    "slight one" (07/05/2017)  . Pneumonia    "several times" (07/05/2017)  . Seizures ([place]) 02/2017 "several"; 06/26/2017 X 1  . Shortness of breath dyspnea   . Stroke [facility]) 02/13/2018   "still right sided weakness" (07/05/2017)   Social History   Socioeconomic History  . Marital status: Widowed    Spouse name: Not on file  . Number of children: Not on file  . Years of education: Not on file  . Highest education level: Not on file  Occupational History  . Not on file  Social Needs  . Financial resource strain: Not on file  . Food insecurity    Worry: Not on file    Inability: Not on file  . Transportation needs    Medical: Not on file    Non-medical: Not on file  Tobacco Use  . Smoking status: Former Smoker    Packs/day: 1.00    Years: 40.00    Pack years: 40.00    Quit date: 06/13/1998    Years since quitting: 20.6  . Smokeless tobacco: Never Used  Substance and Sexual Activity  . Alcohol use: No  . Drug use: No  . Sexual activity: Not Currently  Lifestyle  . Physical activity    Days per week: Not on file    Minutes per session: Not on file  . Stress: Not on file  Relationships  . Social Herbalist on phone: Not on file    Gets together: Not on file    Attends religious service: Not on file    Active member of club or organization: Not on file    Attends meetings of clubs or organizations: Not on file    Relationship status: Not on file  Other Topics Concern  . Not on file  Social History Narrative  . Not on file   Family History  Problem Relation Age of Onset  .  Stomach cancer Mother   . CVA Father   . Bladder Cancer Neg Hx   . Kidney cancer Neg Hx    Scheduled Meds: . alteplase  2 mg Intracatheter Once  . alteplase  2 mg Intracatheter Once  . aspirin  81 mg Per Tube Daily  . calcium gluconate  1 g Intravenous Once  . chlorhexidine gluconate (MEDLINE KIT)  15 mL Mouth Rinse BID  . Chlorhexidine Gluconate Cloth  6 each Topical Daily  . collagenase   Topical Daily  . levETIRAcetam  1,000 mg Per Tube BID  . mouth rinse  15 mL Mouth Rinse 10 times per day  . midodrine  5 mg Oral TID WC  . pantoprazole sodium  40 mg Per Tube Daily  . phenytoin  300 mg Per Tube QHS  .  sodium chloride flush  10-40 mL Intracatheter Q12H  . Valproate Sodium  250 mg Per Tube BID  . vancomycin variable dose per unstable renal function (pharmacist dosing)   Does not apply See admin instructions   Continuous Infusions: . ceFEPime (MAXIPIME) IV    . dextrose Stopped (02/13/19 0957)  . famotidine (PEPCID) IV 100 mL/hr at 02/13/19 1000   PRN Meds:.ondansetron **OR** ondansetron (ZOFRAN) IV, sodium chloride flush No Known Allergies  Vital Signs: BP (!) 90/56   Pulse 93   Temp 98 F (36.7 C) (Oral)   Resp (!) 34   Ht '5\' 3"'$  (1.6 m)   Wt 73.2 kg   SpO2 100%   BMI 28.59 kg/m  Pain Scale: CPOT       SpO2: SpO2: 100 % O2 Device:SpO2: 100 % O2 Flow Rate: .   IO: Intake/output summary:   Intake/Output Summary (Last 24 hours) at 02/13/2019 1043 Last data filed at 02/13/2019 1000 Gross per 24 hour  Intake 1166.7 ml  Output -  Net 1166.7 ml    LBM: Last BM Date: 02/13/19 Baseline Weight: Weight: 68 kg Most recent weight: Weight: 73.2 kg     Palliative Assessment/Data: PPS 10-30% (unresponsive, has access for tube feeds)    The above conversation was completed via telephone due to the visitor restrictions during the COVID-19 pandemic. Thorough chart review and discussion with necessary members of the care team was completed as part of assessment. All issues  were discussed and addressed but no physical exam was performed.  Time Total: 50 minutes Greater than 50%  of this time was spent counseling and coordinating care related to the above assessment and plan.  Juel Burrow, DNP, AGNP-C Palliative Medicine Team [phone] Pager: [phone]

## 2019-02-13 NOTE — Progress Notes (Signed)
Called pt.'s RN and followed up the MD's order for 2nd dose of TPA . Nurse stated that Md has not ordered any as of ths time note is writen. RN paged MD again. To call [facility]AST once  Order obtained.

## 2019-02-13 NOTE — Progress Notes (Addendum)
Melissa Cochran  PROGRESS NOTE    Melissa Cochran  NUU:[id number] DOB: 11/12/1935 DOA: 03/02/2019 PCP: [name], MD   Brief Narrative:   Melissa Cochran is a 83 y.o. female with medical history significant of chronic A. fib on Eliquis, hypertension, coronary artery disease status post stent in 3474, chronic systolic heart failure with ejection fraction of 45 to 50% by echo on 06/28/2017, nephrolithiasis complicated by hydronephrosis status post stent placement in 2017, CVA, seizure disorder, chronic respiratory failure with ventilator dependence and PEG tube placement presented from [place] for the concern of worsening kidney function. Patient is noncommunicative due to her medical issues.  History gathered from the charts.  Her baseline mental status is withdrawing from pain only.  Patient admitted on July/2020 for left ischemic-surgery was consulted who recommended no surgical intervention due to her medical issues. Upon my evaluation: Patient has trach-on vent.  Not alert or oriented.  Mucous noted around the trach site. Due to patient's poor prognosis discussed with PCCM (Dr. Vaughan Browner) for ICU admission-he personally discussed the CODE STATUS with patient's daughter who agreed with DNR.  Patient will be admitted at stepdown unit and PCCM will be consulted for the vent management.  10/6: Hypotensive this AM. Poor prognosis. Awaiting palliative care consult.  Assessment & Plan:   Principal Problem:   Acute on chronic renal failure (HCC) Active Problems:   Hyperlipidemia   Venous ulcer of ankle (HCC)   Pressure ulcer   Seizure disorder (HCC)   Chronic respiratory failure requiring continuous mechanical ventilation through tracheostomy ([facility])   Hypernatremia   AKI (acute kidney injury) ([facility])   Sepsis due to pneumonia (HCC)   Tracheostomy dependence (HCC)   Ischemic toe   Hyperkalemia   Macrocytic anemia   S/P percutaneous endoscopic gastrostomy (PEG) tube placement  (HCC)   Sepsis secondary pneumonia Septic shock w/ multisystem organ failure:      - Patient febrile, tachycardic, leukocytosis, lactic acid elevated.       - Chest x-ray shows increased ill-defined nodular and interstitial opacity within the right upper lobe, suspicious for atypical pneumonia     - on cefepime, vanc      - trach'd, vented     - hypotensive; bolus LR to maintain MAP > 60     - DNR     - awaiting family discussion with palliative care; it is not likely she will survive this event  acute kidney injury HAGMA Lactic acidosis:     - Likely secondary to dehydration.       - Given her baseline status patient is not a good candidate for dialysis as per nephrology.     - Continue IV fluids and avoid nephrotoxic medication.     - Renal ultrasound: a couple of masses noted that would need further imaging to define; she's not appropriate for that imaging at this time     - lactic acid elevated, continue fluids  Hyperkalemia Hypernatremia Uremia     - Secondary to acute kidney injury/dehydration.     - Received IV calcium gluconate, regular insulin 5 units along with dextrose In ED.     - Kayexalate given via PEG tube; continue kayexalate as needed     - D5W     - Nephrology consulted; she is not a candidate for renal replacement  Chronic vent dependent respiratory failure:     - PCCM consulted for vent management; they have s/o'd  Ischemic left great toe:     -  Not a candidate for surgery due to her medical status.     - Continue aspirin.  Seizure disorder:     - Continue phenytoin and Keppra  Microcytic anemia:     - Hgb stable, monitor  Fluid bolus for hypotension. Continue abx. Awaiting outcome of PC talk. She is not likely to survive this event. Not a candidate for renal replacement.   DVT prophylaxis: SCDs Code Status: DNR   Disposition Plan: TBD  Consultants:   PCCM  Nephrology  Antimicrobials:   Vanc, cefepime   ROS:  Unable to obtain d/t  mentation  Subjective: Remains hypotensive.  Objective: Vitals:   02/13/19 1005 02/13/19 1030 02/13/19 1100 02/13/19 1219  BP: (!) 90/56 (!) 73/52    Pulse: 93 92    Resp:  (!) 35  (!) 35  Temp:   97.6 F (36.4 C)   TempSrc:   Axillary   SpO2:  99%    Weight:      Height:        Intake/Output Summary (Last 24 hours) at 02/13/2019 1343 Last data filed at 02/13/2019 1000 Gross per 24 hour  Intake 1166.7 ml  Output --  Net 1166.7 ml   Filed Weights   02/23/2019 1538 02/13/19 0645  Weight: 68 kg 73.2 kg    Examination:  General: 83 y.o. female, ill appearing, resting in bed; trach'd on vent Cardiovascular: RRR, +S1, S2, no m/g/r, equal pulses throughout Respiratory: on vent, trach'd, decreased at bases, some course sounds GI: BS+, NDNT, no masses noted, no organomegaly noted, PEG noted MSK: No e/c/c Neuro: somnolent, miminally responsive to noxious stim   Data Reviewed: I have personally reviewed following labs and imaging studies.  CBC: Recent Labs  Lab 03/05/2019 1455 02/11/2019 1511 02/24/2019 1728 02/13/19 0435  WBC 15.4*  --   --  14.0*  NEUTROABS 12.6*  --   --   --   HGB 8.3* 8.2* 7.5* 7.4*  HCT 30.4* 24.0* 22.0* 27.3*  MCV 101.7*  --   --  105.4*  PLT 172  --   --  937   Basic Metabolic Panel: Recent Labs  Lab 02/25/2019 1455 02/14/2019 1511 02/26/2019 1728 02/13/19 0435  NA 158* 160* 158* 160*  K 6.5* 6.3* 6.4* 6.4*  CL 127* 129*  --  124*  CO2 20*  --   --  17*  GLUCOSE 162* 152*  --  143*  BUN 227* >140*  --  234*  CREATININE 3.14* 2.60*  --  3.32*  CALCIUM 8.7*  --   --  8.7*   GFR: Estimated Creatinine Clearance: 12.7 mL/min (A) (by C-G formula based on SCr of 3.32 mg/dL (H)). Liver Function Tests: Recent Labs  Lab 03/07/2019 1455 02/13/19 0435  AST 65* 48*  ALT 67* 61*  ALKPHOS 96 102  BILITOT 1.2 1.2  PROT 6.5 6.1*  ALBUMIN 1.6* 1.6*   No results for input(s): LIPASE, AMYLASE in the last 168 hours. No results for input(s): AMMONIA in  the last 168 hours. Coagulation Profile: Recent Labs  Lab 02/19/2019 1457 02/13/19 0435  INR 1.9* 1.8*   Cardiac Enzymes: No results for input(s): CKTOTAL, CKMB, CKMBINDEX, TROPONINI in the last 168 hours. BNP (last 3 results) No results for input(s): PROBNP in the last 8760 hours. HbA1C: No results for input(s): HGBA1C in the last 72 hours. CBG: Recent Labs  Lab 02/22/2019 1449 02/10/2019 1936 02/13/19 0747 02/13/19 1207  GLUCAP 149* 200* 107* 116*   Lipid Profile: No results  for input(s): CHOL, HDL, LDLCALC, TRIG, CHOLHDL, LDLDIRECT in the last 72 hours. Thyroid Function Tests: No results for input(s): TSH, T4TOTAL, FREET4, T3FREE, THYROIDAB in the last 72 hours. Anemia Panel: No results for input(s): VITAMINB12, FOLATE, FERRITIN, TIBC, IRON, RETICCTPCT in the last 72 hours. Sepsis Labs: Recent Labs  Lab 03/09/2019 1455 02/13/19 0436 02/13/19 0648  PROCALCITON  --  15.94  --   LATICACIDVEN 2.5*  --  3.0*    Recent Results (from the past 240 hour(s))  Blood culture (routine x 2)     Status: None (Preliminary result)   Collection Time: 02/19/2019  2:57 PM   Specimen: BLOOD RIGHT FOREARM  Result Value Ref Range Status   Specimen Description BLOOD RIGHT FOREARM  Final   Special Requests   Final    BOTTLES DRAWN AEROBIC AND ANAEROBIC Blood Culture results may not be optimal due to an inadequate volume of blood received in culture bottles   Culture   Final    NO GROWTH < 24 HOURS Performed at [facility], [place] [address], [place], [place] [address]    Report Status PENDING  Incomplete  Blood culture (routine x 2)     Status: None (Preliminary result)   Collection Time: 02/10/2019  3:22 PM   Specimen: BLOOD RIGHT ARM  Result Value Ref Range Status   Specimen Description BLOOD RIGHT ARM  Final   Special Requests   Final    BOTTLES DRAWN AEROBIC AND ANAEROBIC Blood Culture results may not be optimal due to an inadequate volume of blood received in culture bottles    Culture   Final    NO GROWTH < 24 HOURS Performed at [facility], [place] [address] [place], [place] [address]    Report Status PENDING  Incomplete  SARS Coronavirus 2 [facility] order, Performed in [facility] hospital lab) Nasopharyngeal Nasopharyngeal Swab     Status: None   Collection Time: 02/16/2019  4:59 PM   Specimen: Nasopharyngeal Swab  Result Value Ref Range Status   SARS Coronavirus 2 NEGATIVE NEGATIVE Final    Comment: (NOTE) If result is NEGATIVE SARS-CoV-2 target nucleic acids are NOT DETECTED. The SARS-CoV-2 RNA is generally detectable in upper and lower  respiratory specimens during the acute phase of infection. The lowest  concentration of SARS-CoV-2 viral copies this assay can detect is 250  copies / mL. A negative result does not preclude SARS-CoV-2 infection  and should not be used as the sole basis for treatment or other  patient management decisions.  A negative result may occur with  improper specimen collection / handling, submission of specimen other  than nasopharyngeal swab, presence of viral mutation(s) within the  areas targeted by this assay, and inadequate number of viral copies  (<250 copies / mL). A negative result must be combined with clinical  observations, patient history, and epidemiological information. If result is POSITIVE SARS-CoV-2 target nucleic acids are DETECTED. The SARS-CoV-2 RNA is generally detectable in upper and lower  respiratory specimens dur ing the acute phase of infection.  Positive  results are indicative of active infection with SARS-CoV-2.  Clinical  correlation with patient history and other diagnostic information is  necessary to determine patient infection status.  Positive results do  not rule out bacterial infection or co-infection with other viruses. If result is PRESUMPTIVE POSTIVE SARS-CoV-2 nucleic acids MAY BE PRESENT.   A presumptive positive result was obtained on the submitted specimen  and confirmed on  repeat testing.  While 2019 novel coronavirus  (SARS-CoV-2) nucleic acids may be present in the submitted sample  additional confirmatory testing may be necessary for epidemiological  and / or clinical management purposes  to differentiate between  SARS-CoV-2 and other Sarbecovirus currently known to infect humans.  If clinically indicated additional testing with an alternate test  methodology [phone]) is advised. The SARS-CoV-2 RNA is generally  detectable in upper and lower respiratory sp ecimens during the acute  phase of infection. The expected result is Negative. Fact Sheet for Patients:  StrictlyIdeas.no Fact Sheet for Healthcare Providers: BankingDealers.co.za This test is not yet approved or cleared by the [place] FDA and has been authorized for detection and/or diagnosis of SARS-CoV-2 by FDA under an Emergency Use Authorization (EUA).  This EUA will remain in effect (meaning this test can be used) for the duration of the COVID-19 declaration under Section 564(b)(1) of the Act, 21 U.S.C. section 360bbb-3(b)(1), unless the authorization is terminated or revoked sooner. Performed at [facility], [place] [address], [place], [place] [address]   MRSA PCR Screening     Status: None   Collection Time: 02/13/19  6:30 AM   Specimen: Nasal Mucosa; Nasopharyngeal  Result Value Ref Range Status   MRSA by PCR NEGATIVE NEGATIVE Final    Comment:        The GeneXpert MRSA Assay (FDA approved for NASAL specimens only), is one component of a comprehensive MRSA colonization surveillance program. It is not intended to diagnose MRSA infection nor to guide or monitor treatment for MRSA infections. Performed at [facility], [place] [address] [place],  [address]       Radiology Studies: Dg Tibia/fibula Left  Result Date: 02/23/2019 CLINICAL DATA:  Infection EXAM: LEFT TIBIA AND FIBULA - 2 VIEW COMPARISON:  None.  FINDINGS: Characterization of osseous detail is limited by diffuse osteopenia, however, there is no fracture line or displaced fracture fragment seen. No destructive change to suggest osteomyelitis. Probable benign bone apart within the distal tibia. Soft tissues about the LEFT tibia and fibula are unremarkable. Questionable soft tissue defect/ulcer lateral to the mid ulna. IMPRESSION: 1. No acute appearing osseous abnormality, with characterization slightly limited by osteopenia. No obvious evidence of osteomyelitis. 2. Questionable soft tissue defect/ulcer lateral to the mid ulna. Electronically Signed   By: Franki Cabot M.D.   On: 03/01/2019 15:52   US Renal  Result Date: 02/25/2019 CLINICAL DATA:  Acute renal failure EXAM: RENAL / URINARY TRACT ULTRASOUND COMPLETE COMPARISON:  02/20/2017, CT 11/12/2018 FINDINGS: Right Kidney: Renal measurements: 11 x 6.1 x 7.5 cm = volume: 261.5 mL . Echogenicity within normal limits. No mass or hydronephrosis visualized. Left Kidney: Renal measurements: 12.3 x 6.4 x 5.9 cm = volume: 242.8 mL. Cortical echogenicity normal. No hydronephrosis. 8 mm shadowing stone lower pole left kidney. Small echogenic mass within the cortex of the lower pole measuring 1.4 x 1.2 x 0.8 cm. Bladder: Appears normal for degree of bladder distention. Complex cystic mass within the left adnexa measures 6.2 x 5.2 x 6.3 cm. Color flow present within thickened septa. IMPRESSION: 1. Negative for hydronephrosis. 2. 8 mm stone in the lower pole left kidney. 3. 1.4 cm echogenic mass within the cortex of the lower pole left kidney, no corresponding abnormality on previous CT from July, this may represent a new finding. Follow-up CT or MRI may be obtained when clinically feasible. 4. Complex cystic mass with thickened septa in the left adnexa measuring up to 6.3 cm, corresponding  to CT demonstrated complex cystic adnexal mass indeterminate for neoplasm. Electronically Signed   By: Donavan Foil M.D.   On:  02/13/2019 21:56   Dg Chest Port 1 View  Result Date: 02/28/2019 CLINICAL DATA:  Sepsis EXAM: PORTABLE CHEST 1 VIEW COMPARISON:  Chest x-rays dated 12/03/2017 and 09/30/2018. FINDINGS: Tracheostomy tube appears appropriately positioned above the level of the carina. LEFT-sided PICC line is well position with tip at the level of the lower SVC. Increased ill-defined nodular and interstitial opacities within the RIGHT upper lobe. Coarse lung markings are not significantly changed throughout the remainder of the lungs bilaterally. No pleural effusion or pneumothorax seen. Heart size and mediastinal contours appear stable. Osseous structures about the chest are unremarkable. IMPRESSION: 1. Increased ill-defined nodular and interstitial opacities within the RIGHT upper lobe, suspicious for atypical pneumonia, alternatively asymmetric edema. 2. Tracheostomy tube appears appropriately positioned above the level of the carina. 3. Coarse lung markings are not significantly changed throughout the remainder of the lungs bilaterally, likely indicating a chronic underlying interstitial lung disease. Electronically Signed   By: Franki Cabot M.D.   On: 02/26/2019 15:35   Dg Foot 2 Views Left  Result Date: 02/21/2019 CLINICAL DATA:  Infection EXAM: LEFT FOOT - 2 VIEW COMPARISON:  None. FINDINGS: Diffuse osteopenia limits characterization of osseous detail, however, there is no fracture line or displaced fracture fragment identified. No destructive change seen to confirm an osteomyelitis. Presumed soft tissue defect overlying the posterior margin of the calcaneus. IMPRESSION: 1. No acute findings. No osseous fracture or dislocation seen. No evidence of osteomyelitis. 2. Presumed soft tissue defect overlying the posterior margin of the calcaneus, presumably a soft tissue ulcer. Electronically Signed   By: Franki Cabot M.D.   On: 03/02/2019 15:40     Scheduled Meds:  alteplase  2 mg Intracatheter Once   alteplase  2  mg Intracatheter Once   aspirin  81 mg Per Tube Daily   calcium gluconate  1 g Intravenous Once   chlorhexidine gluconate (MEDLINE KIT)  15 mL Mouth Rinse BID   Chlorhexidine Gluconate Cloth  6 each Topical Daily   collagenase   Topical Daily   enoxaparin (LOVENOX) injection  30 mg Subcutaneous Q24H   free water  150 mL Per Tube Q4H   [START ON 02/14/2019] levETIRAcetam  500 mg Per Tube Daily   mouth rinse  15 mL Mouth Rinse 10 times per day   midodrine  5 mg Oral TID WC   pantoprazole sodium  40 mg Per Tube Daily   phenytoin  300 mg Per Tube QHS   sodium bicarbonate  50 mEq Intravenous Once   sodium chloride flush  10-40 mL Intracatheter Q12H   sodium polystyrene  30 g Per Tube Once   Valproate Sodium  250 mg Per Tube BID   vancomycin variable dose per unstable renal function (pharmacist dosing)   Does not apply See admin instructions   Continuous Infusions:  ceFEPime (MAXIPIME) IV     dextrose 100 mL/hr at 02/13/19 1244     LOS: 1 day    Time spent: 35 minutes spent in the coordination of care.   Jonnie Finner, DO [facility] Pager [phone]  If 7PM-7AM, please contact night-coverage [email or web address] Password [email or web address] 02/13/2019, 1:43 PM

## 2019-02-13 NOTE — Progress Notes (Signed)
MAP < 60. Updated MD Marylyn Ishihara. Ordered 500cc LR bolus. No further escalation with pressors. Palliative consult pending. Will continue to monitor.

## 2019-02-13 NOTE — Consult Note (Signed)
[facility] Nurse wound consult note Patient receiving care in [facility].  Consult completed remotely after review of record including images, and phone conversation with primary RN, Lorriane Shire. According to Epic, Dr. Sylvan Cheese is the attending.  I have sent him a SecureChat message asking for a surgical consult for the LLE.  I am thinking she may need an amputation of the left leg. Reason for Consult: multiple LLE wounds, sacral wound Wound type: Unstageable to sacrum and LLE  Dressing procedure/placement/frequency: Apply iodine (from the swabsticks in clean utility) to all wounds on the LLE and foot.  Allow to air dry. Wrap in kerlex. Apply Santyl to the sacral in a nickel thick layer. Cover with a saline moistened gauze, then dry gauze, or ABD pad, or foam pad.   Change daily. Monitor the wound area(s) for worsening of condition such as: Signs/symptoms of infection,  Increase in size,  Development of or worsening of odor, Development of pain, or increased pain at the affected locations.  Notify the medical team if any of these develop.  Thank you for the consult.  Discussed plan of care with the bedside nurse.  [place] [facility] nurse will not follow at this time.  Please re-consult the [facility] team if needed.  Val Riles, RN, MSN, CWOCN, CNS-BC, pager [phone]

## 2019-02-13 NOTE — ED Notes (Signed)
Notified MD of Critical Potassium 6.4.

## 2019-02-14 LAB — RENAL FUNCTION PANEL
Albumin: 1.5 g/dL — ABNORMAL LOW (ref 3.5–5.0)
Anion gap: 17 — ABNORMAL HIGH (ref 5–15)
BUN: 214 mg/dL — ABNORMAL HIGH (ref 8–23)
CO2: 18 mmol/L — ABNORMAL LOW (ref 22–32)
Calcium: 8.4 mg/dL — ABNORMAL LOW (ref 8.9–10.3)
Chloride: 118 mmol/L — ABNORMAL HIGH (ref 98–111)
Creatinine, Ser: 3.6 mg/dL — ABNORMAL HIGH (ref 0.44–1.00)
GFR calc Af Amer: 13 mL/min — ABNORMAL LOW (ref >60)
GFR calc non Af Amer: 11 mL/min — ABNORMAL LOW (ref >60)
Glucose, Bld: 112 mg/dL — ABNORMAL HIGH (ref 70–99)
Phosphorus: 6.1 mg/dL — ABNORMAL HIGH (ref 2.5–4.6)
Potassium: 4.8 mmol/L (ref 3.5–5.1)
Sodium: 153 mmol/L — ABNORMAL HIGH (ref 135–145)

## 2019-02-14 LAB — CBC WITH DIFFERENTIAL/PLATELET
Abs Immature Granulocytes: 0.17 10*3/uL — ABNORMAL HIGH (ref 0.00–0.07)
Basophils Absolute: 0.1 10*3/uL (ref 0.0–0.1)
Basophils Relative: 0 %
Eosinophils Absolute: 0.3 10*3/uL (ref 0.0–0.5)
Eosinophils Relative: 2 %
HCT: 27.1 % — ABNORMAL LOW (ref 36.0–46.0)
Hemoglobin: 7.4 g/dL — ABNORMAL LOW (ref 12.0–15.0)
Immature Granulocytes: 1 %
Lymphocytes Relative: 11 %
Lymphs Abs: 1.7 10*3/uL (ref 0.7–4.0)
MCH: 28.5 pg (ref 26.0–34.0)
MCHC: 27.3 g/dL — ABNORMAL LOW (ref 30.0–36.0)
MCV: 104.2 fL — ABNORMAL HIGH (ref 80.0–100.0)
Monocytes Absolute: 0.4 10*3/uL (ref 0.1–1.0)
Monocytes Relative: 2 %
Neutro Abs: 12.7 10*3/uL — ABNORMAL HIGH (ref 1.7–7.7)
Neutrophils Relative %: 84 %
Platelets: 201 10*3/uL (ref 150–400)
RBC: 2.6 MIL/uL — ABNORMAL LOW (ref 3.87–5.11)
RDW: 16.8 % — ABNORMAL HIGH (ref 11.5–15.5)
WBC: 15.4 10*3/uL — ABNORMAL HIGH (ref 4.0–10.5)
nRBC: 0.2 % (ref 0.0–0.2)

## 2019-02-14 LAB — MAGNESIUM: Magnesium: 3.7 mg/dL — ABNORMAL HIGH (ref 1.7–2.4)

## 2019-02-14 LAB — GLUCOSE, CAPILLARY
Glucose-Capillary: 100 mg/dL — ABNORMAL HIGH (ref 70–99)
Glucose-Capillary: 101 mg/dL — ABNORMAL HIGH (ref 70–99)
Glucose-Capillary: 106 mg/dL — ABNORMAL HIGH (ref 70–99)
Glucose-Capillary: 108 mg/dL — ABNORMAL HIGH (ref 70–99)
Glucose-Capillary: 113 mg/dL — ABNORMAL HIGH (ref 70–99)
Glucose-Capillary: 165 mg/dL — ABNORMAL HIGH (ref 70–99)

## 2019-02-14 LAB — VANCOMYCIN, RANDOM: Vancomycin Rm: 20

## 2019-02-14 NOTE — Progress Notes (Signed)
Daily Progress Note   Patient Name: Melissa Cochran       Date: 02/14/2019 DOB: 01-Mar-1936  Age: 83 y.o. MRN#: [id number] Attending Physician: Donne Hazel, MD Primary Care Physician: [name], MD Admit Date: 02/11/2019  Reason for Consultation/Follow-up: Establishing goals of care  Subjective: Spoke with RN - no changes, no meaningful interaction, hypotension continues, creatinine worsening  Length of Stay: 2  Current Medications: Scheduled Meds:  . alteplase  2 mg Intracatheter Once  . alteplase  2 mg Intracatheter Once  . aspirin  81 mg Per Tube Daily  . calcium gluconate  1 g Intravenous Once  . chlorhexidine gluconate (MEDLINE KIT)  15 mL Mouth Rinse BID  . Chlorhexidine Gluconate Cloth  6 each Topical Daily  . collagenase   Topical Daily  . free water  150 mL Per Tube Q4H  . levETIRAcetam  500 mg Per Tube Daily  . mouth rinse  15 mL Mouth Rinse 10 times per day  . midodrine  5 mg Oral TID WC  . pantoprazole sodium  40 mg Per Tube Daily  . phenytoin  300 mg Per Tube QHS  . sodium chloride flush  10-40 mL Intracatheter Q12H  . Valproate Sodium  250 mg Per Tube BID  . vancomycin variable dose per unstable renal function (pharmacist dosing)   Does not apply See admin instructions    Continuous Infusions: . ceFEPime (MAXIPIME) IV Stopped (02/13/19 1801)  . dextrose 100 mL/hr at 02/14/19 1000    PRN Meds: ondansetron **OR** ondansetron (ZOFRAN) IV, sodium chloride flush      Vital Signs: BP (!) 94/51   Pulse 85   Temp 98 F (36.7 C) (Axillary)   Resp (!) 33   Ht '5\' 3"'$  (1.6 m)   Wt 73.2 kg   SpO2 99%   BMI 28.59 kg/m  SpO2: SpO2: 99 % O2 Device: O2 Device: Ventilator O2 Flow Rate:    Intake/output summary:   Intake/Output Summary (Last 24 hours) at  02/14/2019 1025 Last data filed at 02/14/2019 1000 Gross per 24 hour  Intake 2367.3 ml  Output -  Net 2367.3 ml   LBM: Last BM Date: 02/13/19 Baseline Weight: Weight: 68 kg Most recent weight: Weight: 73.2 kg       Palliative Assessment/Data: PPS 10-30%    Flowsheet Rows     Most Recent Value  Intake Tab  Referral Department  Critical care  Unit at Time of Referral  ICU  Palliative Care Primary Diagnosis  Nephrology  Date Notified  02/21/2019  Palliative Care Type  Return patient Palliative Care  Reason for referral  Clarify Goals of Care  Date of Admission  03/08/2019  Date first seen by Palliative Care  02/13/19  # of days Palliative referral response time  1 Day(s)  # of days IP prior to Palliative referral  0  Clinical Assessment  Palliative Performance Scale Score  30%  Psychosocial & Spiritual Assessment  Palliative Care Outcomes      Patient Active Problem List   Diagnosis Date Noted  . Acute on chronic renal failure ([place]) 02/09/2019  . Hyperkalemia 03/06/2019  . Macrocytic anemia 03/06/2019  . S/P percutaneous endoscopic gastrostomy (  PEG) tube placement ([place]) 02/25/2019  . Ischemic toe 12/05/2018  . Critical lower limb ischemia 12/04/2018  . Tracheostomy dependence ([place]) 12/04/2018  . Permanent vegetative state ([place]) 12/04/2018  . Pressure injury of skin 11/20/2018  . Fever 11/20/2018  . Bacteremia due to Klebsiella pneumoniae 11/13/2018  . UTI (urinary tract infection) 11/13/2018  . Sepsis due to pneumonia ([place]) 11/12/2018  . Goals of care, counseling/discussion   . Palliative care by specialist   . Meningioma, cerebral ([place]) 09/27/2018  . Other emphysema ([place]) 09/27/2018  . Febrile illness, acute   . Severe sepsis with septic shock ([place]) 09/26/2018  . Hypernatremia 09/26/2018  . AKI (acute kidney injury) ([place]) 09/26/2018  . CVA (cerebral vascular accident) ([place]) 04/19/2018  . Chronic respiratory failure requiring continuous mechanical ventilation  through tracheostomy ([place]) 04/19/2018  . E-coli UTI, ESBL  06/30/2017  . A-fib ([place]) 06/30/2017  . Need for protective airway ventilation 06/30/2017  . Seizure disorder ([place]) 06/26/2017  . Pressure ulcer 02/17/2017  . Bilateral lower extremity edema 10/08/2016  . Venous ulcer of ankle ([place]) 07/08/2016  . DVT (deep venous thrombosis) ([place]) 07/02/2016  . S/P coronary artery stent placement 10/30/2015  . 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 Mass of lower lobe of left lung, incidetnal on CT 09/2015, smoker.  09/29/2015  . Other microscopic hematuria 08/26/2015  . Urge incontinence 08/26/2015  . Essential (primary) hypertension 12/09/2014  . Adnexal mass 11/28/2013  . History of cardiac catheterization 11/28/2013  . Hyperlipidemia 11/28/2013    Palliative Care Assessment & Plan   HPI: 83 y.o. female  with past medical history of chronic respiratory failure with trach and ventilator dependence, PEG dependence, a fib on eliquis, HTN, CAD, sys HF, hydronephrosis, CVA, seizures,  admitted on 02/11/2019 with abnormal labs/concern of worsening kidney function. Patient has had multiple hospitalizations and multiple palliative medicine consults - in the past, daughter has always requested full code/sull scope interventions despite recommendations to consider other approaches to her care. Patient is non-communicative and does not respond to stimuli.  Patient is being treated for sepsis secondary to pna. She also has significant AKI and hyperkalemia - not a dialysis candidate. She has an ischemic left great toe and multiple wounds to legs and sacrum. She was evaluated for leg amputation earlier this year but was deemed to not be a candidate/?daughter declined surgery. This admission, daughter has agreed to DNR status given the severity of her illness. PMT consulted to continue [place] conversations.   Assessment: Follow up with patient's daughter today and she agrees to speak for a "few  minutes". We discussed patient's hospital course and last month at [place] in detail. Daughter tells me she recognizes how much her mother has declined and admits that time is short. We discussed very limited options moving forward - kidneys are failing and she is not a dialysis candidate, ischemic toe/multiple wounds - not a surgical candidate. We discussed that current medical interventions are "bandaids" and do fix the situation or change the ultimate outcome. [name] expresses understanding to all of this.  We discussed options moving forward - specifically discussed consideration of comfort care approach. [name] tells me this has been discussed with her before at [facility] and she had considered it then but was never able to commit to a decision. She asks multiple appropriate questions about the process and ventilator withdrawal - we discussed these in detail. [name] is quite tearful and tells me she is not able to make a decision at  this time (she is at work) and she would also like to discuss this with her daughter. I provided [name] with my call back information and asked that she please call with any further questions. We made a plan to speak again tomorrow morning. Conversation ended somewhat abruptly as [name] was emotional.   Recommendations/Plan:  Plan to speak with patient's daughter again in AM - frank discussion this AM about the situation, asked daughter to consider full comfort care - plans to speak with her daughter/patient's granddaughter before making a decisions   Code Status:  DNR  Prognosis:   very poor prognosis  Discharge Planning:  To Be Determined  Care plan was discussed with RN and patient's daughter, update sent to Dr. Wyline Copas  Thank you for allowing the Palliative Medicine Team to assist in the care of this patient.   Total Time 45 minutes Prolonged Time Billed  no    The above conversation was completed via telephone due to the visitor restrictions during the  COVID-19 pandemic. Thorough chart review and discussion with necessary members of the care team was completed as part of assessment. All issues were discussed and addressed but no physical exam was performed.    Greater than 50%  of this time was spent counseling and coordinating care related to the above assessment and plan.  Juel Burrow, DNP, [facility] Palliative Medicine Team Team Phone # [phone]  Pager [phone]

## 2019-02-14 NOTE — Progress Notes (Signed)
Nutrition Brief Note  Discussed patient in ICU rounds and with RN today. Chart reviewed. Poor prognosis being discussed with daughter, possibly transitioning to comfort care. No escalation of care planned at this time, including initiation of tube feeding. No nutrition intervention indicated at this time. Please consult if nutrition concerns arise or TF is initiated.  Molli Barrows, RD, LDN, [place] Pager [phone] After Hours Pager [phone]

## 2019-02-14 NOTE — Progress Notes (Signed)
PROGRESS NOTE    Melissa Cochran  WGN:[id number] DOB: 1935/09/16 DOA: 03/10/2019 PCP: [name], MD    Brief Narrative:  83 y.o.femalewith medical history significant ofchronic A. fib on Eliquis, hypertension, coronary artery disease status post stent in 7846, chronic systolic heart failure with ejection fraction of 45 to 50% by echo on 06/28/2017, nephrolithiasis complicated by hydronephrosis status post stent placement in 2017, CVA, seizure disorder, chronic respiratory failure with ventilator dependence and PEG tube placement presented from [place] for the concern of worsening kidney function. Patient is noncommunicative due to her medical issues.History gathered from the charts.Her baseline mental status is withdrawing from pain only. Patient admitted on July/2020 for left ischemic-surgery was consulted who recommended no surgical intervention due to her medical issues. Upon my evaluation: Patient has trach-on vent.Not alert or oriented. Mucous noted around the trach site. Due to patient's poor prognosis discussed with PCCM(Dr. Mannam)for ICU admission-he personally discussed the CODE STATUS with patient's daughter who agreed with DNR. Patient will be admitted at stepdown unit and PCCM will be consulted for the vent management.  Assessment & Plan:   Principal Problem:   Acute on chronic renal failure (HCC) Active Problems:   Hyperlipidemia   Venous ulcer of ankle (HCC)   Pressure ulcer   Seizure disorder (HCC)   Chronic respiratory failure requiring continuous mechanical ventilation through tracheostomy ([place])   Hypernatremia   AKI (acute kidney injury) ([place])   Sepsis due to pneumonia (HCC)   Tracheostomy dependence (HCC)   Ischemic toe   Hyperkalemia   Macrocytic anemia   S/P percutaneous endoscopic gastrostomy (PEG) tube placement (HCC)  Sepsis secondary pneumonia Septic shock w/ multisystem organ failure, present on admit:      - Patient febrile,  tachycardic, leukocytosis, lactic acid elevated.       - Chest x-ray shows increased ill-defined nodular and interstitial opacity within the right upper lobe, suspicious for atypical pneumonia     - for now, continued on cefepime, vanc      - remains trach'd, vented     - recently noted to be hypotensive, requiring IVF boluses     - DNR     - Appreciate input by Palliative Care. Very poor prognosis. Given renal failure, anticipate less than 2 week prognosis  acute kidney injury with oliguria HAGMA Lactic acidosis:     - Likely secondary to dehydration.       - Given her baseline status patient is not a good candidate for dialysis as per nephrology.     - Pt is continued on IV fluids, continue to avoid nephrotoxic medications as tolerated.     - Renal ultrasound: a couple of masses noted that would need further imaging to define; she's not appropriate for that imaging at this time     - lactic acid noted to be elevated, Pt is continued on IVF hydration -Nephrology since signed off -Pt is becoming more oliguric. Worsening renal function  Hyperkalemia Hypernatremia Uremia     - Secondary to acute kidney injury/dehydration.     - Received IV calcium gluconate, regular insulin 5 units along with dextrose In ED.     - Kayexalate given via PEG tube; continue kayexalate as needed     - D5W     - Nephrology had been consulted; she is not a candidate for renal replacement, Nephrology since signed off  Chronic vent dependent respiratory failure:     - PCCM consulted for vent management, since signed off  Ischemic left great toe:     - Not a candidate for surgery due to her medical status.     - Pt is continued on aspirin.  Seizure disorder:     - Continued on phenytoin and Keppra -No evidence of seizures at this time  Microcytic anemia:     - Seems to be stable thus far  DVT prophylaxis: SCD's Code Status: DNR Family Communication: Pt in room, family over phone Disposition  Plan: Uncertain at this time  Consultants:   Palliative Care  Nephrology  PCCM  Procedures:     Antimicrobials: Anti-infectives (From admission, onward)   Start     Dose/Rate Route Frequency Ordered Stop   02/13/19 1600  ceFEPIme (MAXIPIME) 1 g in sodium chloride 0.9 % 100 mL IVPB     1 g 200 mL/hr over 30 Minutes Intravenous Every 24 hours 03/10/2019 1610     02/20/2019 1512  vancomycin variable dose per unstable renal function (pharmacist dosing)      Does not apply See admin instructions 03/08/2019 1512     02/16/2019 1500  ceFEPIme (MAXIPIME) 2 g in sodium chloride 0.9 % 100 mL IVPB     2 g 200 mL/hr over 30 Minutes Intravenous  Once 02/21/2019 1458 02/24/2019 1632   03/02/2019 1500  metroNIDAZOLE (FLAGYL) IVPB 500 mg     500 mg 100 mL/hr over 60 Minutes Intravenous  Once 02/21/2019 1458 02/09/2019 1703       Subjective: Unable to obtain given mental status  Objective: Vitals:   02/14/19 1125 02/14/19 1200 02/14/19 1300 02/14/19 1400  BP:  (!) 102/46 (!) 96/46 (!) 90/49  Pulse:  74 73 76  Resp:  (!) 38 (!) 38 (!) 37  Temp: (!) 97.2 F (36.2 C)     TempSrc: Axillary     SpO2:  98% 97% 97%  Weight:      Height:        Intake/Output Summary (Last 24 hours) at 02/14/2019 1459 Last data filed at 02/14/2019 1400 Gross per 24 hour  Intake 3472.96 ml  Output --  Net 3472.96 ml   Filed Weights   02/17/2019 1538 02/13/19 0645  Weight: 68 kg 73.2 kg    Examination:  General exam: Appears calm and comfortable  Respiratory system: Clear to auscultation. Respiratory effort normal. Cardiovascular system: S1 & S2 heard, RRR Gastrointestinal system: Abdomen is nondistended, soft and nontender. No organomegaly or masses felt. Normal bowel sounds heard. Central nervous system:lethargic. No focal neurological deficits. Extremities: Symmetric 5 x 5 power. Skin: No rashes, lesions Psychiatry: Unable to assess given mental status  Data Reviewed: I have personally reviewed following  labs and imaging studies  CBC: Recent Labs  Lab 02/24/2019 1455 02/11/2019 1511 02/21/2019 1728 02/13/19 0435 02/14/19 0439  WBC 15.4*  --   --  14.0* 15.4*  NEUTROABS 12.6*  --   --   --  12.7*  HGB 8.3* 8.2* 7.5* 7.4* 7.4*  HCT 30.4* 24.0* 22.0* 27.3* 27.1*  MCV 101.7*  --   --  105.4* 104.2*  PLT 172  --   --  165 030   Basic Metabolic Panel: Recent Labs  Lab 03/09/2019 1455 02/23/2019 1511 02/11/2019 1728 02/13/19 0435 02/13/19 1230 02/14/19 0439  NA 158* 160* 158* 160* 161* 153*  K 6.5* 6.3* 6.4* 6.4* 5.3* 4.8  CL 127* 129*  --  124* 124* 118*  CO2 20*  --   --  17* 18* 18*  GLUCOSE 162* 152*  --  143* 142* 112*  BUN 227* >140*  --  234* 225* 214*  CREATININE 3.14* 2.60*  --  3.32* 3.56* 3.60*  CALCIUM 8.7*  --   --  8.7* 8.6* 8.4*  MG  --   --   --   --   --  3.7*  PHOS  --   --   --   --   --  6.1*   GFR: Estimated Creatinine Clearance: 11.7 mL/min (A) (by C-G formula based on SCr of 3.6 mg/dL (H)). Liver Function Tests: Recent Labs  Lab 03/03/2019 1455 02/13/19 0435 02/14/19 0439  AST 65* 48*  --   ALT 67* 61*  --   ALKPHOS 96 102  --   BILITOT 1.2 1.2  --   PROT 6.5 6.1*  --   ALBUMIN 1.6* 1.6* 1.5*   No results for input(s): LIPASE, AMYLASE in the last 168 hours. No results for input(s): AMMONIA in the last 168 hours. Coagulation Profile: Recent Labs  Lab 02/21/2019 1457 02/13/19 0435  INR 1.9* 1.8*   Cardiac Enzymes: No results for input(s): CKTOTAL, CKMB, CKMBINDEX, TROPONINI in the last 168 hours. BNP (last 3 results) No results for input(s): PROBNP in the last 8760 hours. HbA1C: No results for input(s): HGBA1C in the last 72 hours. CBG: Recent Labs  Lab 02/13/19 2011 02/14/19 0014 02/14/19 0426 02/14/19 0731 02/14/19 1123  GLUCAP 112* 113* 106* 108* 101*   Lipid Profile: No results for input(s): CHOL, HDL, LDLCALC, TRIG, CHOLHDL, LDLDIRECT in the last 72 hours. Thyroid Function Tests: No results for input(s): TSH, T4TOTAL, FREET4, T3FREE,  THYROIDAB in the last 72 hours. Anemia Panel: No results for input(s): VITAMINB12, FOLATE, FERRITIN, TIBC, IRON, RETICCTPCT in the last 72 hours. Sepsis Labs: Recent Labs  Lab 02/14/2019 1455 02/13/19 0436 02/13/19 0648  PROCALCITON  --  15.94  --   LATICACIDVEN 2.5*  --  3.0*    Recent Results (from the past 240 hour(s))  Blood culture (routine x 2)     Status: None (Preliminary result)   Collection Time: 02/13/2019  2:57 PM   Specimen: BLOOD RIGHT FOREARM  Result Value Ref Range Status   Specimen Description BLOOD RIGHT FOREARM  Final   Special Requests   Final    BOTTLES DRAWN AEROBIC AND ANAEROBIC Blood Culture results may not be optimal due to an inadequate volume of blood received in culture bottles   Culture   Final    NO GROWTH 2 DAYS Performed at [facility], [place] [address] [place], [place] [address]    Report Status PENDING  Incomplete  Blood culture (routine x 2)     Status: None (Preliminary result)   Collection Time: 02/14/2019  3:22 PM   Specimen: BLOOD RIGHT ARM  Result Value Ref Range Status   Specimen Description BLOOD RIGHT ARM  Final   Special Requests   Final    BOTTLES DRAWN AEROBIC AND ANAEROBIC Blood Culture results may not be optimal due to an inadequate volume of blood received in culture bottles   Culture   Final    NO GROWTH 2 DAYS Performed at [facility], [place] [address] [place], [place] [address]    Report Status PENDING  Incomplete  SARS Coronavirus 2 [facility] order, Performed in [facility] hospital lab) Nasopharyngeal Nasopharyngeal Swab     Status: None   Collection Time: 03/06/2019  4:59 PM   Specimen: Nasopharyngeal Swab  Result Value Ref Range Status   SARS Coronavirus  2 NEGATIVE NEGATIVE Final    Comment: (NOTE) If result is NEGATIVE SARS-CoV-2 target nucleic acids are NOT DETECTED. The SARS-CoV-2 RNA is generally detectable in upper and lower  respiratory specimens during the acute phase of infection. The lowest    concentration of SARS-CoV-2 viral copies this assay can detect is 250  copies / mL. A negative result does not preclude SARS-CoV-2 infection  and should not be used as the sole basis for treatment or other  patient management decisions.  A negative result may occur with  improper specimen collection / handling, submission of specimen other  than nasopharyngeal swab, presence of viral mutation(s) within the  areas targeted by this assay, and inadequate number of viral copies  (<250 copies / mL). A negative result must be combined with clinical  observations, patient history, and epidemiological information. If result is POSITIVE SARS-CoV-2 target nucleic acids are DETECTED. The SARS-CoV-2 RNA is generally detectable in upper and lower  respiratory specimens dur ing the acute phase of infection.  Positive  results are indicative of active infection with SARS-CoV-2.  Clinical  correlation with patient history and other diagnostic information is  necessary to determine patient infection status.  Positive results do  not rule out bacterial infection or co-infection with other viruses. If result is PRESUMPTIVE POSTIVE SARS-CoV-2 nucleic acids MAY BE PRESENT.   A presumptive positive result was obtained on the submitted specimen  and confirmed on repeat testing.  While 2019 novel coronavirus  (SARS-CoV-2) nucleic acids may be present in the submitted sample  additional confirmatory testing may be necessary for epidemiological  and / or clinical management purposes  to differentiate between  SARS-CoV-2 and other Sarbecovirus currently known to infect humans.  If clinically indicated additional testing with an alternate test  methodology [id number]) is advised. The SARS-CoV-2 RNA is generally  detectable in upper and lower respiratory sp ecimens during the acute  phase of infection. The expected result is Negative. Fact Sheet for Patients:  StrictlyIdeas.no Fact Sheet  for Healthcare Providers: BankingDealers.co.za This test is not yet approved or cleared by the [place] FDA and has been authorized for detection and/or diagnosis of SARS-CoV-2 by FDA under an Emergency Use Authorization (EUA).  This EUA will remain in effect (meaning this test can be used) for the duration of the COVID-19 declaration under Section 564(b)(1) of the Act, 21 U.S.C. section 360bbb-3(b)(1), unless the authorization is terminated or revoked sooner. Performed at [facility], [place] [address], [place], [place] [address]   MRSA PCR Screening     Status: None   Collection Time: 02/13/19  6:30 AM   Specimen: Nasal Mucosa; Nasopharyngeal  Result Value Ref Range Status   MRSA by PCR NEGATIVE NEGATIVE Final    Comment:        The GeneXpert MRSA Assay (FDA approved for NASAL specimens only), is one component of a comprehensive MRSA colonization surveillance program. It is not intended to diagnose MRSA infection nor to guide or monitor treatment for MRSA infections. Performed at [facility], [place] [address] [place], [place] [address]      Radiology Studies: Dg Tibia/fibula Left  Result Date: 03/02/2019 CLINICAL DATA:  Infection EXAM: LEFT TIBIA AND FIBULA - 2 VIEW COMPARISON:  None. FINDINGS: Characterization of osseous detail is limited by diffuse osteopenia, however, there is no fracture line or displaced fracture fragment seen. No destructive change to suggest osteomyelitis. Probable benign bone apart within the distal tibia. Soft tissues about the LEFT tibia  and fibula are unremarkable. Questionable soft tissue defect/ulcer lateral to the mid ulna. IMPRESSION: 1. No acute appearing osseous abnormality, with characterization slightly limited by osteopenia. No obvious evidence of osteomyelitis. 2. Questionable soft tissue defect/ulcer lateral to the mid ulna. Electronically Signed   By: Franki Cabot M.D.   On: 03/04/2019 15:52   US  Renal  Result Date: 02/14/2019 CLINICAL DATA:  Acute renal failure EXAM: RENAL / URINARY TRACT ULTRASOUND COMPLETE COMPARISON:  02/20/2017, CT 11/12/2018 FINDINGS: Right Kidney: Renal measurements: 11 x 6.1 x 7.5 cm = volume: 261.5 mL . Echogenicity within normal limits. No mass or hydronephrosis visualized. Left Kidney: Renal measurements: 12.3 x 6.4 x 5.9 cm = volume: 242.8 mL. Cortical echogenicity normal. No hydronephrosis. 8 mm shadowing stone lower pole left kidney. Small echogenic mass within the cortex of the lower pole measuring 1.4 x 1.2 x 0.8 cm. Bladder: Appears normal for degree of bladder distention. Complex cystic mass within the left adnexa measures 6.2 x 5.2 x 6.3 cm. Color flow present within thickened septa. IMPRESSION: 1. Negative for hydronephrosis. 2. 8 mm stone in the lower pole left kidney. 3. 1.4 cm echogenic mass within the cortex of the lower pole left kidney, no corresponding abnormality on previous CT from July, this may represent a new finding. Follow-up CT or MRI may be obtained when clinically feasible. 4. Complex cystic mass with thickened septa in the left adnexa measuring up to 6.3 cm, corresponding to CT demonstrated complex cystic adnexal mass indeterminate for neoplasm. Electronically Signed   By: Donavan Foil M.D.   On: 03/05/2019 21:56   Dg Chest Port 1 View  Result Date: 02/17/2019 CLINICAL DATA:  Sepsis EXAM: PORTABLE CHEST 1 VIEW COMPARISON:  Chest x-rays dated 12/03/2017 and 09/30/2018. FINDINGS: Tracheostomy tube appears appropriately positioned above the level of the carina. LEFT-sided PICC line is well position with tip at the level of the lower SVC. Increased ill-defined nodular and interstitial opacities within the RIGHT upper lobe. Coarse lung markings are not significantly changed throughout the remainder of the lungs bilaterally. No pleural effusion or pneumothorax seen. Heart size and mediastinal contours appear stable. Osseous structures about the chest  are unremarkable. IMPRESSION: 1. Increased ill-defined nodular and interstitial opacities within the RIGHT upper lobe, suspicious for atypical pneumonia, alternatively asymmetric edema. 2. Tracheostomy tube appears appropriately positioned above the level of the carina. 3. Coarse lung markings are not significantly changed throughout the remainder of the lungs bilaterally, likely indicating a chronic underlying interstitial lung disease. Electronically Signed   By: Franki Cabot M.D.   On: 02/20/2019 15:35   Dg Foot 2 Views Left  Result Date: 03/08/2019 CLINICAL DATA:  Infection EXAM: LEFT FOOT - 2 VIEW COMPARISON:  None. FINDINGS: Diffuse osteopenia limits characterization of osseous detail, however, there is no fracture line or displaced fracture fragment identified. No destructive change seen to confirm an osteomyelitis. Presumed soft tissue defect overlying the posterior margin of the calcaneus. IMPRESSION: 1. No acute findings. No osseous fracture or dislocation seen. No evidence of osteomyelitis. 2. Presumed soft tissue defect overlying the posterior margin of the calcaneus, presumably a soft tissue ulcer. Electronically Signed   By: Franki Cabot M.D.   On: 03/08/2019 15:40    Scheduled Meds:  alteplase  2 mg Intracatheter Once   alteplase  2 mg Intracatheter Once   aspirin  81 mg Per Tube Daily   calcium gluconate  1 g Intravenous Once   chlorhexidine gluconate (MEDLINE KIT)  15 mL Mouth Rinse  BID   Chlorhexidine Gluconate Cloth  6 each Topical Daily   collagenase   Topical Daily   free water  150 mL Per Tube Q4H   levETIRAcetam  500 mg Per Tube Daily   mouth rinse  15 mL Mouth Rinse 10 times per day   midodrine  5 mg Oral TID WC   pantoprazole sodium  40 mg Per Tube Daily   phenytoin  300 mg Per Tube QHS   sodium chloride flush  10-40 mL Intracatheter Q12H   Valproate Sodium  250 mg Per Tube BID   vancomycin variable dose per unstable renal function (pharmacist dosing)    Does not apply See admin instructions   Continuous Infusions:  ceFEPime (MAXIPIME) IV Stopped (02/13/19 1801)   dextrose 100 mL/hr at 02/14/19 1400     LOS: 2 days   Marylu Lund, MD [facility] Pager On Amion  If 7PM-7AM, please contact night-coverage 02/14/2019, 2:59 PM

## 2019-02-15 DIAGNOSIS — E878 Other disorders of electrolyte and fluid balance, not elsewhere classified: Secondary | ICD-10-CM

## 2019-02-15 LAB — GLUCOSE, CAPILLARY
Glucose-Capillary: 110 mg/dL — ABNORMAL HIGH (ref 70–99)
Glucose-Capillary: 122 mg/dL — ABNORMAL HIGH (ref 70–99)
Glucose-Capillary: 128 mg/dL — ABNORMAL HIGH (ref 70–99)
Glucose-Capillary: 134 mg/dL — ABNORMAL HIGH (ref 70–99)
Glucose-Capillary: 166 mg/dL — ABNORMAL HIGH (ref 70–99)
Glucose-Capillary: 167 mg/dL — ABNORMAL HIGH (ref 70–99)

## 2019-02-17 LAB — CULTURE, BLOOD (ROUTINE X 2)
Culture: NO GROWTH
Culture: NO GROWTH

## 2019-03-11 NOTE — Progress Notes (Addendum)
DR notified patient BP trending down. Currently 87/47(60). No new order at this time, will continue to monitor.

## 2019-03-11 NOTE — Progress Notes (Signed)
Pt asystole on monitor, no palpable pulses felt, no heart sounds heard for 1 min, no lung sounds auscultated. RN Ronny Bacon verified. MD notified. Family is on the way.

## 2019-03-11 NOTE — Progress Notes (Addendum)
PROGRESS NOTE    Melissa Cochran  MLY:[id number] DOB: 11/04/1935 DOA: 02/27/2019 PCP: [name], MD    Brief Narrative:  83 y.o.femalewith medical history significant ofchronic A. fib on Eliquis, hypertension, coronary artery disease status post stent in 8127, chronic systolic heart failure with ejection fraction of 45 to 50% by echo on 06/28/2017, nephrolithiasis complicated by hydronephrosis status post stent placement in 2017, CVA, seizure disorder, chronic respiratory failure with ventilator dependence and PEG tube placement presented from [place] for the concern of worsening kidney function. Patient is noncommunicative due to her medical issues.History gathered from the charts.Her baseline mental status is withdrawing from pain only. Patient admitted on July/2020 for left ischemic-surgery was consulted who recommended no surgical intervention due to her medical issues. Upon my evaluation: Patient has trach-on vent.Not alert or oriented. Mucous noted around the trach site. Due to patient's poor prognosis discussed with PCCM(Dr. Mannam)for ICU admission-he personally discussed the CODE STATUS with patient's daughter who agreed with DNR. Patient will be admitted at stepdown unit and PCCM will be consulted for the vent management.  Assessment & Plan:   Principal Problem:   Acute on chronic renal failure (HCC) Active Problems:   Hyperlipidemia   Venous ulcer of ankle (HCC)   Pressure ulcer   Seizure disorder (HCC)   Chronic respiratory failure requiring continuous mechanical ventilation through tracheostomy ([place])   Hypernatremia   AKI (acute kidney injury) ([place])   Sepsis due to pneumonia (HCC)   Tracheostomy dependence (HCC)   Ischemic toe   Hyperkalemia   Macrocytic anemia   S/P percutaneous endoscopic gastrostomy (PEG) tube placement (HCC)  Sepsis secondary pneumonia Septic shock w/ multisystem organ failure, present on admit:      - Patient febrile,  tachycardic, leukocytosis, lactic acid elevated.       - Chest x-ray shows increased ill-defined nodular and interstitial opacity within the right upper lobe, suspicious for atypical pneumonia     - for now, continued on cefepime, vanc      - remains trach'd, vented     - recently noted to be hypotensive, requiring IVF boluses     - DNR     - Appreciate input by Palliative Care. Pt remains with poor prognosis. Given renal failure and oliguria, anticipate less than 2 week prognosis  acute kidney injury with oliguria HAG[place] Lactic acidosis:     - Likely secondary to dehydration.       - Given her baseline status patient is not a good candidate for dialysis as per nephrology.     - Pt is continued on IV fluids, continue to avoid nephrotoxic medications as tolerated.     - Renal ultrasound: a couple of masses noted that would need further imaging to define; she's not appropriate for that imaging at this time     - lactic acid noted to be elevated, Pt is continued on IVF hydration -Nephrology since signed off -Pt noted to be more oliguric. Worsening renal function  Hyperkalemia Hypernatremia Uremia     - Secondary to acute kidney injury/dehydration.     - Received IV calcium gluconate, regular insulin 5 units along with dextrose In ED.     - Kayexalate given via PEG tube; continue kayexalate as needed     - D5W     - Nephrology had been consulted; she is not a candidate for renal replacement, Nephrology since signed off  Chronic vent dependent respiratory failure:     - PCCM consulted for vent  management, since signed off  Ischemic left great toe:     - Not a candidate for surgery due to her medical status.     - Pt is continued on aspirin.  Seizure disorder:     - Continued on phenytoin and Keppra -No evidence of seizures at this time  Microcytic anemia:     - Seems to be stable thus far  DVT prophylaxis: SCD's Code Status: DNR Family Communication: Pt in room, family  over phone on 10/7 Disposition Plan: Uncertain at this time  Consultants:   Palliative Care  Nephrology  PCCM  Procedures:     Antimicrobials: Anti-infectives (From admission, onward)   Start     Dose/Rate Route Frequency Ordered Stop   02/13/19 1600  ceFEPIme (MAXIPIME) 1 g in sodium chloride 0.9 % 100 mL IVPB     1 g 200 mL/hr over 30 Minutes Intravenous Every 24 hours 02/21/2019 1610     02/14/2019 1512  vancomycin variable dose per unstable renal function (pharmacist dosing)  Status:  Discontinued      Does not apply See admin instructions 03/04/2019 1512 02/14/19 1547   03/01/2019 1500  ceFEPIme (MAXIPIME) 2 g in sodium chloride 0.9 % 100 mL IVPB     2 g 200 mL/hr over 30 Minutes Intravenous  Once 02/11/2019 1458 03/09/2019 1632   02/17/2019 1500  metroNIDAZOLE (FLAGYL) IVPB 500 mg     500 mg 100 mL/hr over 60 Minutes Intravenous  Once 02/13/2019 1458 02/22/2019 1703      Subjective: Cannot obtain given mental status  Objective: Vitals:   26-Feb-2019 1230 02/26/19 1300 Feb 26, 2019 1400 02-26-19 1500  BP:  93/68 (!) 93/59 (!) 95/56  Pulse:  76 76 76  Resp:  (!) 30 (!) 30 (!) 30  Temp: (!) 96 F (35.6 C)     TempSrc: Axillary     SpO2:  94% 95% 95%  Weight:      Height:        Intake/Output Summary (Last 24 hours) at 26-Feb-2019 1533 Last data filed at February 26, 2019 1500 Gross per 24 hour  Intake 2672.91 ml  Output -  Net 2672.91 ml   Filed Weights   02/22/2019 1538 02/13/19 0645  Weight: 68 kg 73.2 kg    Examination: General exam: Lethargic, laying in bed, in nad Respiratory system: Normal respiratory effort, no wheezing  Data Reviewed: I have personally reviewed following labs and imaging studies  CBC: Recent Labs  Lab 03/01/2019 1455 02/25/2019 1511 03/09/2019 1728 02/13/19 0435 02/14/19 0439  WBC 15.4*  --   --  14.0* 15.4*  NEUTROABS 12.6*  --   --   --  12.7*  HGB 8.3* 8.2* 7.5* 7.4* 7.4*  HCT 30.4* 24.0* 22.0* 27.3* 27.1*  MCV 101.7*  --   --  105.4* 104.2*  PLT  172  --   --  165 409   Basic Metabolic Panel: Recent Labs  Lab 03/06/2019 1455 02/11/2019 1511 03/04/2019 1728 02/13/19 0435 02/13/19 1230 02/14/19 0439  NA 158* 160* 158* 160* 161* 153*  K 6.5* 6.3* 6.4* 6.4* 5.3* 4.8  CL 127* 129*  --  124* 124* 118*  CO2 20*  --   --  17* 18* 18*  GLUCOSE 162* 152*  --  143* 142* 112*  BUN 227* >140*  --  234* 225* 214*  CREATININE 3.14* 2.60*  --  3.32* 3.56* 3.60*  CALCIUM 8.7*  --   --  8.7* 8.6* 8.4*  MG  --   --   --   --   --  3.7*  PHOS  --   --   --   --   --  6.1*   GFR: Estimated Creatinine Clearance: 11.7 mL/min (A) (by C-G formula based on SCr of 3.6 mg/dL (H)). Liver Function Tests: Recent Labs  Lab 02/20/2019 1455 02/13/19 0435 02/14/19 0439  AST 65* 48*  --   ALT 67* 61*  --   ALKPHOS 96 102  --   BILITOT 1.2 1.2  --   PROT 6.5 6.1*  --   ALBUMIN 1.6* 1.6* 1.5*   No results for input(s): LIPASE, AMYLASE in the last 168 hours. No results for input(s): AMMONIA in the last 168 hours. Coagulation Profile: Recent Labs  Lab 02/09/2019 1457 02/13/19 0435  INR 1.9* 1.8*   Cardiac Enzymes: No results for input(s): CKTOTAL, CKMB, CKMBINDEX, TROPONINI in the last 168 hours. BNP (last 3 results) No results for input(s): PROBNP in the last 8760 hours. HbA1C: No results for input(s): HGBA1C in the last 72 hours. CBG: Recent Labs  Lab 02/14/19 2005 03-Mar-2019 0014 Mar 03, 2019 0452 03-03-19 0841 03-Mar-2019 1207  GLUCAP 165* 128* 122* 167* 166*   Lipid Profile: No results for input(s): CHOL, HDL, LDLCALC, TRIG, CHOLHDL, LDLDIRECT in the last 72 hours. Thyroid Function Tests: No results for input(s): TSH, T4TOTAL, FREET4, T3FREE, THYROIDAB in the last 72 hours. Anemia Panel: No results for input(s): VITAMINB12, FOLATE, FERRITIN, TIBC, IRON, RETICCTPCT in the last 72 hours. Sepsis Labs: Recent Labs  Lab 02/13/2019 1455 02/13/19 0436 02/13/19 0648  PROCALCITON  --  15.94  --   LATICACIDVEN 2.5*  --  3.0*    Recent Results  (from the past 240 hour(s))  Blood culture (routine x 2)     Status: None (Preliminary result)   Collection Time: 02/16/2019  2:57 PM   Specimen: BLOOD RIGHT FOREARM  Result Value Ref Range Status   Specimen Description BLOOD RIGHT FOREARM  Final   Special Requests   Final    BOTTLES DRAWN AEROBIC AND ANAEROBIC Blood Culture results may not be optimal due to an inadequate volume of blood received in culture bottles   Culture   Final    NO GROWTH 3 DAYS Performed at [facility], [place] [address], [place], [place] [address]    Report Status PENDING  Incomplete  Blood culture (routine x 2)     Status: None (Preliminary result)   Collection Time: 03/10/2019  3:22 PM   Specimen: BLOOD RIGHT ARM  Result Value Ref Range Status   Specimen Description BLOOD RIGHT ARM  Final   Special Requests   Final    BOTTLES DRAWN AEROBIC AND ANAEROBIC Blood Culture results may not be optimal due to an inadequate volume of blood received in culture bottles   Culture   Final    NO GROWTH 3 DAYS Performed at [facility], [place] [address] [place], [place] [address]    Report Status PENDING  Incomplete  SARS Coronavirus 2 [facility] order, Performed in [facility] hospital lab) Nasopharyngeal Nasopharyngeal Swab     Status: None   Collection Time: 03/04/2019  4:59 PM   Specimen: Nasopharyngeal Swab  Result Value Ref Range Status   SARS Coronavirus 2 NEGATIVE NEGATIVE Final    Comment: (NOTE) If result is NEGATIVE SARS-CoV-2 target nucleic acids are NOT DETECTED. The SARS-CoV-2 RNA is generally detectable in upper and lower  respiratory specimens during the acute phase of infection. The lowest  concentration of SARS-CoV-2 viral copies this assay can detect is  250  copies / mL. A negative result does not preclude SARS-CoV-2 infection  and should not be used as the sole basis for treatment or other  patient management decisions.  A negative result may occur with  improper specimen collection / handling,  submission of specimen other  than nasopharyngeal swab, presence of viral mutation(s) within the  areas targeted by this assay, and inadequate number of viral copies  (<250 copies / mL). A negative result must be combined with clinical  observations, patient history, and epidemiological information. If result is POSITIVE SARS-CoV-2 target nucleic acids are DETECTED. The SARS-CoV-2 RNA is generally detectable in upper and lower  respiratory specimens dur ing the acute phase of infection.  Positive  results are indicative of active infection with SARS-CoV-2.  Clinical  correlation with patient history and other diagnostic information is  necessary to determine patient infection status.  Positive results do  not rule out bacterial infection or co-infection with other viruses. If result is PRESUMPTIVE POSTIVE SARS-CoV-2 nucleic acids MAY BE PRESENT.   A presumptive positive result was obtained on the submitted specimen  and confirmed on repeat testing.  While 2019 novel coronavirus  (SARS-CoV-2) nucleic acids may be present in the submitted sample  additional confirmatory testing may be necessary for epidemiological  and / or clinical management purposes  to differentiate between  SARS-CoV-2 and other Sarbecovirus currently known to infect humans.  If clinically indicated additional testing with an alternate test  methodology [phone]) is advised. The SARS-CoV-2 RNA is generally  detectable in upper and lower respiratory sp ecimens during the acute  phase of infection. The expected result is Negative. Fact Sheet for Patients:  StrictlyIdeas.no Fact Sheet for Healthcare Providers: BankingDealers.co.za This test is not yet approved or cleared by the [place] FDA and has been authorized for detection and/or diagnosis of SARS-CoV-2 by FDA under an Emergency Use Authorization (EUA).  This EUA will remain in effect (meaning this test can be  used) for the duration of the COVID-19 declaration under Section 564(b)(1) of the Act, 21 U.S.C. section 360bbb-3(b)(1), unless the authorization is terminated or revoked sooner. Performed at [facility], [place] [address], [place], [place] [address]   MRSA PCR Screening     Status: None   Collection Time: 02/13/19  6:30 AM   Specimen: Nasal Mucosa; Nasopharyngeal  Result Value Ref Range Status   MRSA by PCR NEGATIVE NEGATIVE Final    Comment:        The GeneXpert MRSA Assay (FDA approved for NASAL specimens only), is one component of a comprehensive MRSA colonization surveillance program. It is not intended to diagnose MRSA infection nor to guide or monitor treatment for MRSA infections. Performed at [facility], [place] [address] [place], [place] [address]      Radiology Studies: No results found.  Scheduled Meds: . alteplase  2 mg Intracatheter Once  . alteplase  2 mg Intracatheter Once  . aspirin  81 mg Per Tube Daily  . calcium gluconate  1 g Intravenous Once  . chlorhexidine gluconate (MEDLINE KIT)  15 mL Mouth Rinse BID  . Chlorhexidine Gluconate Cloth  6 each Topical Daily  . collagenase   Topical Daily  . free water  150 mL Per Tube Q4H  . levETIRAcetam  500 mg Per Tube Daily  . mouth rinse  15 mL Mouth Rinse 10 times per day  . midodrine  5 mg Oral TID WC  . pantoprazole sodium  40 mg  Per Tube Daily  . phenytoin  300 mg Per Tube QHS  . sodium chloride flush  10-40 mL Intracatheter Q12H  . Valproate Sodium  250 mg Per Tube BID   Continuous Infusions: . ceFEPime (MAXIPIME) IV Stopped (02/14/19 1614)  . dextrose 100 mL/hr at March 03, 2019 1500     LOS: 3 days   Marylu Lund, MD [facility] Pager On Amion  If 7PM-7AM, please contact night-coverage 03/03/19, 3:33 PM

## 2019-03-11 NOTE — Discharge Summary (Signed)
Death Summary  PERFECTA DUFAULT [id number] DOB: May 30, 1935 DOA: 02/22/19  PCP: [name], MD  Admit date: 22-Feb-2019 Date of Death: February 26, 2019 Time of Death: 08-13-02 Notification: [name], MD notified of death of 02-26-2019   History of present illness:  Patient was an 83yo female with hx of chronic atrial fibrillation on eliquis, hypertension, CAD s/p stenting in [id number], chronic systolic CHF with EF [id number], CVA, Seizure disorder, chronic respiratory failure with ventilator dependence and chronic PEG placement who presented from [facility] with concerns of worsening renal function.   During evaluation, patient was found to be septic with right upper lobe pneumonia. Patient's condition continued to deteriorate despite broad spectrum antibiotics and IVF hydration. Patient's renal function did not improve and patient became oliguric. Nephrology and Critical Care were consulted and were following. Patient's wishes were noted to be DNR. Per Critical Care, prognosis of recovery was very low. Per Nephrology, patient was not considered a candidate for dialysis. Palliative Care was consulted and following, assisting with family meetings to address end-of-life issues. On the evening of 02/25/2019 at 08-13-02, patient was noted to be asystole on monitor with no palpable pulses and no heart sounds auscultated. Patient was pronounced at 08/13/02.  Final Diagnoses:  -Sepsis secondary pneumonia with septic shock w/ multisystem organ failure, present on admit: -Acute kidney injury with oliguria -Hyperkalemia -Hypernatremia -Uremia -Chronic vent dependent respiratory failure: -Ischemic left great toe: -Seizure disorder: -Microcytic anemia:  The results of significant diagnostics from this hospitalization (including imaging, microbiology, ancillary and laboratory) are listed below for reference.    Significant Diagnostic Studies: Dg Tibia/fibula Left  Result Date: 2019-02-22 CLINICAL DATA:   Infection EXAM: LEFT TIBIA AND FIBULA - 2 VIEW COMPARISON:  None. FINDINGS: Characterization of osseous detail is limited by diffuse osteopenia, however, there is no fracture line or displaced fracture fragment seen. No destructive change to suggest osteomyelitis. Probable benign bone apart within the distal tibia. Soft tissues about the LEFT tibia and fibula are unremarkable. Questionable soft tissue defect/ulcer lateral to the mid ulna. IMPRESSION: 1. No acute appearing osseous abnormality, with characterization slightly limited by osteopenia. No obvious evidence of osteomyelitis. 2. Questionable soft tissue defect/ulcer lateral to the mid ulna. Electronically Signed   By: Franki Cabot M.D.   On: 2019-02-22 15:52   US Renal  Result Date: 02-22-19 CLINICAL DATA:  Acute renal failure EXAM: RENAL / URINARY TRACT ULTRASOUND COMPLETE COMPARISON:  02/20/2017, CT 11/12/2018 FINDINGS: Right Kidney: Renal measurements: 11 x 6.1 x 7.5 cm = volume: 261.5 mL . Echogenicity within normal limits. No mass or hydronephrosis visualized. Left Kidney: Renal measurements: 12.3 x 6.4 x 5.9 cm = volume: 242.8 mL. Cortical echogenicity normal. No hydronephrosis. 8 mm shadowing stone lower pole left kidney. Small echogenic mass within the cortex of the lower pole measuring 1.4 x 1.2 x 0.8 cm. Bladder: Appears normal for degree of bladder distention. Complex cystic mass within the left adnexa measures 6.2 x 5.2 x 6.3 cm. Color flow present within thickened septa. IMPRESSION: 1. Negative for hydronephrosis. 2. 8 mm stone in the lower pole left kidney. 3. 1.4 cm echogenic mass within the cortex of the lower pole left kidney, no corresponding abnormality on previous CT from July, this may represent a new finding. Follow-up CT or MRI may be obtained when clinically feasible. 4. Complex cystic mass with thickened septa in the left adnexa measuring up to 6.3 cm, corresponding to CT demonstrated complex cystic adnexal mass indeterminate  for neoplasm. Electronically Signed  By: Donavan Foil M.D.   On: 03/07/2019 21:56   Dg Chest Port 1 View  Result Date: 03/07/2019 CLINICAL DATA:  Sepsis EXAM: PORTABLE CHEST 1 VIEW COMPARISON:  Chest x-rays dated 12/03/2017 and 09/30/2018. FINDINGS: Tracheostomy tube appears appropriately positioned above the level of the carina. LEFT-sided PICC line is well position with tip at the level of the lower SVC. Increased ill-defined nodular and interstitial opacities within the RIGHT upper lobe. Coarse lung markings are not significantly changed throughout the remainder of the lungs bilaterally. No pleural effusion or pneumothorax seen. Heart size and mediastinal contours appear stable. Osseous structures about the chest are unremarkable. IMPRESSION: 1. Increased ill-defined nodular and interstitial opacities within the RIGHT upper lobe, suspicious for atypical pneumonia, alternatively asymmetric edema. 2. Tracheostomy tube appears appropriately positioned above the level of the carina. 3. Coarse lung markings are not significantly changed throughout the remainder of the lungs bilaterally, likely indicating a chronic underlying interstitial lung disease. Electronically Signed   By: Franki Cabot M.D.   On: 03/06/2019 15:35   Dg Foot 2 Views Left  Result Date: 02/21/2019 CLINICAL DATA:  Infection EXAM: LEFT FOOT - 2 VIEW COMPARISON:  None. FINDINGS: Diffuse osteopenia limits characterization of osseous detail, however, there is no fracture line or displaced fracture fragment identified. No destructive change seen to confirm an osteomyelitis. Presumed soft tissue defect overlying the posterior margin of the calcaneus. IMPRESSION: 1. No acute findings. No osseous fracture or dislocation seen. No evidence of osteomyelitis. 2. Presumed soft tissue defect overlying the posterior margin of the calcaneus, presumably a soft tissue ulcer. Electronically Signed   By: Franki Cabot M.D.   On: 02/18/2019 15:40     Microbiology: Recent Results (from the past 240 hour(s))  Blood culture (routine x 2)     Status: None (Preliminary result)   Collection Time: 02/26/2019  2:57 PM   Specimen: BLOOD RIGHT FOREARM  Result Value Ref Range Status   Specimen Description BLOOD RIGHT FOREARM  Final   Special Requests   Final    BOTTLES DRAWN AEROBIC AND ANAEROBIC Blood Culture results may not be optimal due to an inadequate volume of blood received in culture bottles   Culture   Final    NO GROWTH 4 DAYS Performed at [facility], [place] [address] [place], [place] [address]    Report Status PENDING  Incomplete  Blood culture (routine x 2)     Status: None (Preliminary result)   Collection Time: 02/11/2019  3:22 PM   Specimen: BLOOD RIGHT ARM  Result Value Ref Range Status   Specimen Description BLOOD RIGHT ARM  Final   Special Requests   Final    BOTTLES DRAWN AEROBIC AND ANAEROBIC Blood Culture results may not be optimal due to an inadequate volume of blood received in culture bottles   Culture   Final    NO GROWTH 4 DAYS Performed at [facility], [place] [address] [place], [place] [address]    Report Status PENDING  Incomplete  SARS Coronavirus 2 [facility] order, Performed in [facility] hospital lab) Nasopharyngeal Nasopharyngeal Swab     Status: None   Collection Time: 03/03/2019  4:59 PM   Specimen: Nasopharyngeal Swab  Result Value Ref Range Status   SARS Coronavirus 2 NEGATIVE NEGATIVE Final    Comment: (NOTE) If result is NEGATIVE SARS-CoV-2 target nucleic acids are NOT DETECTED. The SARS-CoV-2 RNA is generally detectable in upper and lower  respiratory specimens during the acute phase  of infection. The lowest  concentration of SARS-CoV-2 viral copies this assay can detect is 250  copies / mL. A negative result does not preclude SARS-CoV-2 infection  and should not be used as the sole basis for treatment or other  patient management decisions.  A negative result may occur with   improper specimen collection / handling, submission of specimen other  than nasopharyngeal swab, presence of viral mutation(s) within the  areas targeted by this assay, and inadequate number of viral copies  (<250 copies / mL). A negative result must be combined with clinical  observations, patient history, and epidemiological information. If result is POSITIVE SARS-CoV-2 target nucleic acids are DETECTED. The SARS-CoV-2 RNA is generally detectable in upper and lower  respiratory specimens dur ing the acute phase of infection.  Positive  results are indicative of active infection with SARS-CoV-2.  Clinical  correlation with patient history and other diagnostic information is  necessary to determine patient infection status.  Positive results do  not rule out bacterial infection or co-infection with other viruses. If result is PRESUMPTIVE POSTIVE SARS-CoV-2 nucleic acids MAY BE PRESENT.   A presumptive positive result was obtained on the submitted specimen  and confirmed on repeat testing.  While 2019 novel coronavirus  (SARS-CoV-2) nucleic acids may be present in the submitted sample  additional confirmatory testing may be necessary for epidemiological  and / or clinical management purposes  to differentiate between  SARS-CoV-2 and other Sarbecovirus currently known to infect humans.  If clinically indicated additional testing with an alternate test  methodology [phone]) is advised. The SARS-CoV-2 RNA is generally  detectable in upper and lower respiratory sp ecimens during the acute  phase of infection. The expected result is Negative. Fact Sheet for Patients:  StrictlyIdeas.no Fact Sheet for Healthcare Providers: BankingDealers.co.za This test is not yet approved or cleared by the [place] FDA and has been authorized for detection and/or diagnosis of SARS-CoV-2 by FDA under an Emergency Use Authorization (EUA).  This EUA will  remain in effect (meaning this test can be used) for the duration of the COVID-19 declaration under Section 564(b)(1) of the Act, 21 U.S.C. section 360bbb-3(b)(1), unless the authorization is terminated or revoked sooner. Performed at [facility], [place] [address] [place], [place] [address]   MRSA PCR Screening     Status: None   Collection Time: 02/13/19  6:30 AM   Specimen: Nasal Mucosa; Nasopharyngeal  Result Value Ref Range Status   MRSA by PCR NEGATIVE NEGATIVE Final    Comment:        The GeneXpert MRSA Assay (FDA approved for NASAL specimens only), is one component of a comprehensive MRSA colonization surveillance program. It is not intended to diagnose MRSA infection nor to guide or monitor treatment for MRSA infections. Performed at [facility], [place] [address], [place], [place] [address]      Labs: Basic Metabolic Panel: Recent Labs  Lab 02/25/2019 1455 03/04/2019 1511 02/13/2019 1728 02/13/19 0435 02/13/19 1230 02/14/19 0439  NA 158* 160* 158* 160* 161* 153*  K 6.5* 6.3* 6.4* 6.4* 5.3* 4.8  CL 127* 129*  --  124* 124* 118*  CO2 20*  --   --  17* 18* 18*  GLUCOSE 162* 152*  --  143* 142* 112*  BUN 227* >140*  --  234* 225* 214*  CREATININE 3.14* 2.60*  --  3.32* 3.56* 3.60*  CALCIUM 8.7*  --   --  8.7* 8.6* 8.4*  MG  --   --   --   --   --  3.7*  PHOS  --   --   --   --   --  6.1*   Liver Function Tests: Recent Labs  Lab 02/24/2019 1455 02/13/19 0435 02/14/19 0439  AST 65* 48*  --   ALT 67* 61*  --   ALKPHOS 96 102  --   BILITOT 1.2 1.2  --   PROT 6.5 6.1*  --   ALBUMIN 1.6* 1.6* 1.5*   No results for input(s): LIPASE, AMYLASE in the last 168 hours. No results for input(s): AMMONIA in the last 168 hours. CBC: Recent Labs  Lab 02/18/2019 1455 02/14/2019 1511 02/09/2019 1728 02/13/19 0435 02/14/19 0439  WBC 15.4*  --   --  14.0* 15.4*  NEUTROABS 12.6*  --   --   --  12.7*  HGB 8.3* 8.2* 7.5* 7.4* 7.4*  HCT 30.4* 24.0* 22.0* 27.3* 27.1*   MCV 101.7*  --   --  105.4* 104.2*  PLT 172  --   --  165 201   Cardiac Enzymes: No results for input(s): CKTOTAL, CKMB, CKMBINDEX, TROPONINI in the last 168 hours. D-Dimer No results for input(s): DDIMER in the last 72 hours. BNP: Invalid input(s): POCBNP CBG: Recent Labs  Lab Mar 02, 2019 0452 03-02-19 0841 03-02-2019 1207 Mar 02, 2019 1629 2019-03-02 2017  GLUCAP 122* 167* 166* 134* 110*   Anemia work up No results for input(s): VITAMINB12, FOLATE, FERRITIN, TIBC, IRON, RETICCTPCT in the last 72 hours. Urinalysis    Component Value Date/Time   COLORURINE YELLOW 11/11/2018 2220   APPEARANCEUR TURBID (A) 11/11/2018 2220   APPEARANCEUR Cloudy (A) 01/29/2016 1126   LABSPEC 1.020 11/11/2018 2220   LABSPEC 1.020 01/23/2014 1047   PHURINE 7.5 11/11/2018 2220   GLUCOSEU NEGATIVE 11/11/2018 2220   GLUCOSEU NEGATIVE 01/23/2014 1047   HGBUR LARGE (A) 11/11/2018 2220   BILIRUBINUR SMALL (A) 11/11/2018 2220   BILIRUBINUR Negative 01/13/2016 1110   BILIRUBINUR NEGATIVE 01/23/2014 1047   KETONESUR NEGATIVE 11/11/2018 2220   PROTEINUR >300 (A) 11/11/2018 2220   UROBILINOGEN 0.2 08/04/2015 1140   NITRITE NEGATIVE 11/11/2018 2220   LEUKOCYTESUR MODERATE (A) 11/11/2018 2220   LEUKOCYTESUR TRACE 01/23/2014 1047   Sepsis Labs Invalid input(s): PROCALCITONIN,  WBC,  LACTICIDVEN   SIGNED:  Marylu Lund, MD  [facility] 02/16/2019, 6:25 PM  If 7PM-7AM, please contact night-coverage [email or web address] Password [email or web address]

## 2019-03-11 NOTE — Progress Notes (Signed)
Daily Progress Note   Patient Name: Melissa Cochran       Date: 03-06-19 DOB: Dec 17, 1935  Age: 83 y.o. MRN#: [id number] Attending Physician: Donne Hazel, MD Primary Care Physician: [name], MD Admit Date: 02/16/2019  Reason for Consultation/Follow-up: Establishing goals of care  Subjective: Called and spoke with RN - no changes in status, no urine output - bladder scan overnight revealed 50 ccs  Length of Stay: 3  Current Medications: Scheduled Meds:  . alteplase  2 mg Intracatheter Once  . alteplase  2 mg Intracatheter Once  . aspirin  81 mg Per Tube Daily  . calcium gluconate  1 g Intravenous Once  . chlorhexidine gluconate (MEDLINE KIT)  15 mL Mouth Rinse BID  . Chlorhexidine Gluconate Cloth  6 each Topical Daily  . collagenase   Topical Daily  . free water  150 mL Per Tube Q4H  . levETIRAcetam  500 mg Per Tube Daily  . mouth rinse  15 mL Mouth Rinse 10 times per day  . midodrine  5 mg Oral TID WC  . pantoprazole sodium  40 mg Per Tube Daily  . phenytoin  300 mg Per Tube QHS  . sodium chloride flush  10-40 mL Intracatheter Q12H  . Valproate Sodium  250 mg Per Tube BID    Continuous Infusions: . ceFEPime (MAXIPIME) IV Stopped (02/14/19 1614)  . dextrose 100 mL/hr at 2019/03/06 1000    PRN Meds: ondansetron **OR** ondansetron (ZOFRAN) IV, sodium chloride flush      Vital Signs: BP (!) 100/54   Pulse 73   Temp (!) 96.2 F (35.7 C) (Axillary)   Resp (!) 31   Ht _0  (1.6 m)   Wt 73.2 kg   SpO2 95%   BMI 28.59 kg/m  SpO2: SpO2: 95 % O2 Device: O2 Device: Ventilator O2 Flow Rate:    Intake/output summary:   Intake/Output Summary (Last 24 hours) at 03-06-2019 1218 Last data filed at 2019/03/06 1000 Gross per 24 hour  Intake 2605.67 ml  Output -  Net  2605.67 ml   LBM: Last BM Date: 02/14/19 Baseline Weight: Weight: 68 kg Most recent weight: Weight: 73.2 kg       Palliative Assessment/Data: PPS 10% (tube feeds have stopped)    Flowsheet Rows     Most Recent Value  Intake Tab  Referral Department  Critical care  Unit at Time of Referral  ICU  Palliative Care Primary Diagnosis  Nephrology  Date Notified  03/04/2019  Palliative Care Type  Return patient Palliative Care  Reason for referral  Clarify Goals of Care  Date of Admission  02/28/2019  Date first seen by Palliative Care  02/13/19  # of days Palliative referral response time  1 Day(s)  # of days IP prior to Palliative referral  0  Clinical Assessment  Palliative Performance Scale Score  30%  Psychosocial & Spiritual Assessment  Palliative Care Outcomes      Patient Active Problem List   Diagnosis Date Noted  . Acute on chronic renal failure ([place]) 02/24/2019  . Hyperkalemia 02/09/2019  . Macrocytic anemia 02/25/2019  . S/P percutaneous endoscopic gastrostomy (PEG) tube placement ([place]) 02/10/2019  .  Ischemic toe 12/05/2018  . Critical lower limb ischemia 12/04/2018  . Tracheostomy dependence ([place]) 12/04/2018  . Permanent vegetative state ([place]) 12/04/2018  . Pressure injury of skin 11/20/2018  . Fever 11/20/2018  . Bacteremia due to Klebsiella pneumoniae 11/13/2018  . UTI (urinary tract infection) 11/13/2018  . Sepsis due to pneumonia ([place]) 11/12/2018  . Goals of care, counseling/discussion   . Palliative care by specialist   . Meningioma, cerebral ([place]) 09/27/2018  . Other emphysema ([place]) 09/27/2018  . Febrile illness, acute   . Severe sepsis with septic shock ([place]) 09/26/2018  . Hypernatremia 09/26/2018  . AKI (acute kidney injury) ([place]) 09/26/2018  . CVA (cerebral vascular accident) ([place]) 04/19/2018  . Chronic respiratory failure requiring continuous mechanical ventilation through tracheostomy ([place]) 04/19/2018  . E-coli UTI, ESBL  06/30/2017  . A-fib ([place])  06/30/2017  . Need for protective airway ventilation 06/30/2017  . Seizure disorder ([place]) 06/26/2017  . Pressure ulcer 02/17/2017  . Bilateral lower extremity edema 10/08/2016  . Venous ulcer of ankle ([place]) 07/08/2016  . DVT (deep venous thrombosis) ([place]) 07/02/2016  . S/P coronary artery stent placement 10/30/2015  . Malignant neoplasm of trigone of bladder ([place]) 09/29/2015  . Mass of lower lobe of left lung, incidetnal on CT 09/2015, smoker.  09/29/2015  . Other microscopic hematuria 08/26/2015  . Urge incontinence 08/26/2015  . Essential (primary) hypertension 12/09/2014  . Adnexal mass 11/28/2013  . History of cardiac catheterization 11/28/2013  . Hyperlipidemia 11/28/2013    Palliative Care Assessment & Plan   HPI: 83 y.o. female  with past medical history of chronic respiratory failure with trach and ventilator dependence, PEG dependence, a fib on eliquis, HTN, CAD, sys HF, hydronephrosis, CVA, seizures,  admitted on 02/21/2019 with abnormal labs/concern of worsening kidney function. Patient has had multiple hospitalizations and multiple palliative medicine consults - in the past, daughter has always requested full code/sull scope interventions despite recommendations to consider other approaches to her care. Patient is non-communicative and does not respond to stimuli.  Patient is being treated for sepsis secondary to pna. She also has significant AKI and hyperkalemia - not a dialysis candidate. She has an ischemic left great toe and multiple wounds to legs and sacrum. She was evaluated for leg amputation earlier this year but was deemed to not be a candidate/?daughter declined surgery. This admission, daughter has agreed to DNR status given the severity of her illness. PMT consulted to continue [place] conversations.   Assessment: Follow up again today with daughter, [name] - we reviewed yesterday's conversation and her conversation with Dr. Wyline Copas. We discussed worsening status as patient is  no longer making urine.   Daughter tells me she has spoken to other family about the situation - she feels they all need to visit patient together in order to move forward with decision about vent withdrawal/comfort care. She understands patient is nearing end of life regardless of decision made.  [name] tells me she is unsure timing of visit as they all work but she will arrange for either tonight or tomorrow sometime. We planned to speak again tomorrow regarding how to proceed with care.   Recommendations/Plan:  Daughter has been told patient is at end of life and expresses understanding - she is considering vent withdrawal but would like other family members to visit patient so they can make the decision together - visit may be tonight or tomorrow depending of work schedules  Per visitor policy during pandemic - 4 people may visit if patient is  nearing end of life (does not necessitate comfort care status) - due to this, I have requested that 4 of patient's family members be allowed to visit since she is nearing end of life and visit will help goals of care conversations moving forward - have discussed with bedside RN  Code Status:  DNR  Prognosis:   very poor prognosis - days- week   Discharge Planning:  Anticipated Hospital Death  Care plan was discussed with RN and patient's daughter, update sent to Dr. Wyline Copas  Thank you for allowing the Palliative Medicine Team to assist in the care of this patient.   Total Time 35 minutes Prolonged Time Billed  no    The above conversation was completed via telephone due to the visitor restrictions during the COVID-19 pandemic. Thorough chart review and discussion with necessary members of the care team was completed as part of assessment. All issues were discussed and addressed but no physical exam was performed.    Greater than 50%  of this time was spent counseling and coordinating care related to the above assessment and plan.  Juel Burrow, DNP, [facility] Palliative Medicine Team Team Phone # [phone]  Pager [phone]

## 2019-03-11 NOTE — Progress Notes (Addendum)
Called daughter [name] to give update on patients condition. Pt hands and ears are blue, mottling to bilateral knees, HR 70-80's and sats unable to detect. Daughter was appreciative of information and stated she was on the way with granddaughters.

## 2019-03-11 DEATH — deceased

## 2019-08-11 IMAGING — DX PORTABLE CHEST - 1 VIEW
1 series · 1 of 1 positions shown · non-contrast
Comparison: 08/23/2018

CLINICAL DATA: Elevated BUN and creatinine.

EXAM:
PORTABLE CHEST 1 VIEW

[chest]
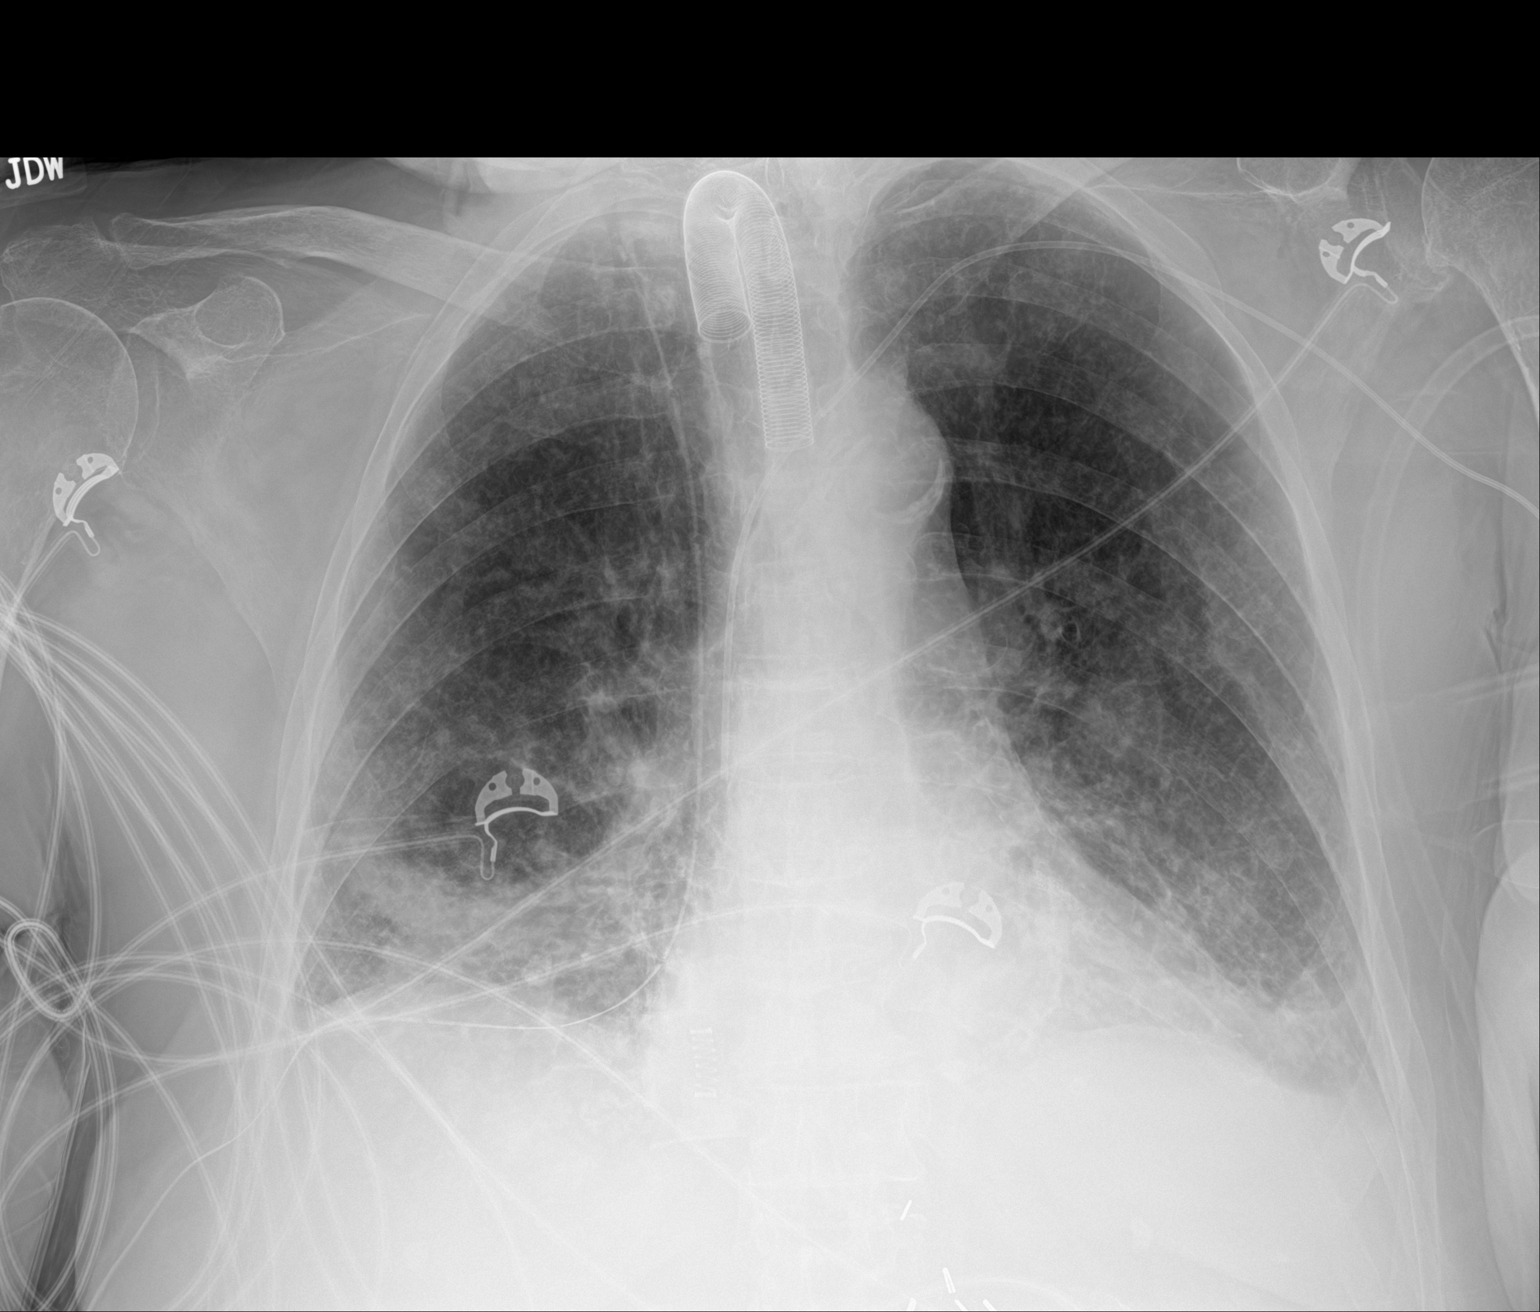

[1 of 1 positions shown; findings below may reference images not displayed]

FINDINGS: Tracheostomy tube noted. Right chest tube visualized in situ. Left
PICC line tip overlies the distal SVC. Interstitial markings are
diffusely coarsened with chronic features. Patchy bibasilar airspace
disease noted, right greater than left. Probable tiny bilateral
pleural effusions. No evidence for pneumothorax. Bones are diffusely
demineralized. Telemetry leads overlie the chest.
IMPRESSION: 1. Interval improvement in basilar aeration with persistent
bibasilar airspace opacities.
2. Tiny bilateral pleural effusions, decreased in the interval.
3. Underlying chronic interstitial lung disease.
4. Small bore right chest tube without evidence for pneumothorax.

## 2019-08-15 IMAGING — DX PORTABLE CHEST - 1 VIEW
1 series · 1 of 1 positions shown · non-contrast
Comparison: Chest x-ray 09/26/2018.

CLINICAL DATA: 83-year-old female with history of pneumonia.

EXAM:
PORTABLE CHEST 1 VIEW

[chest]
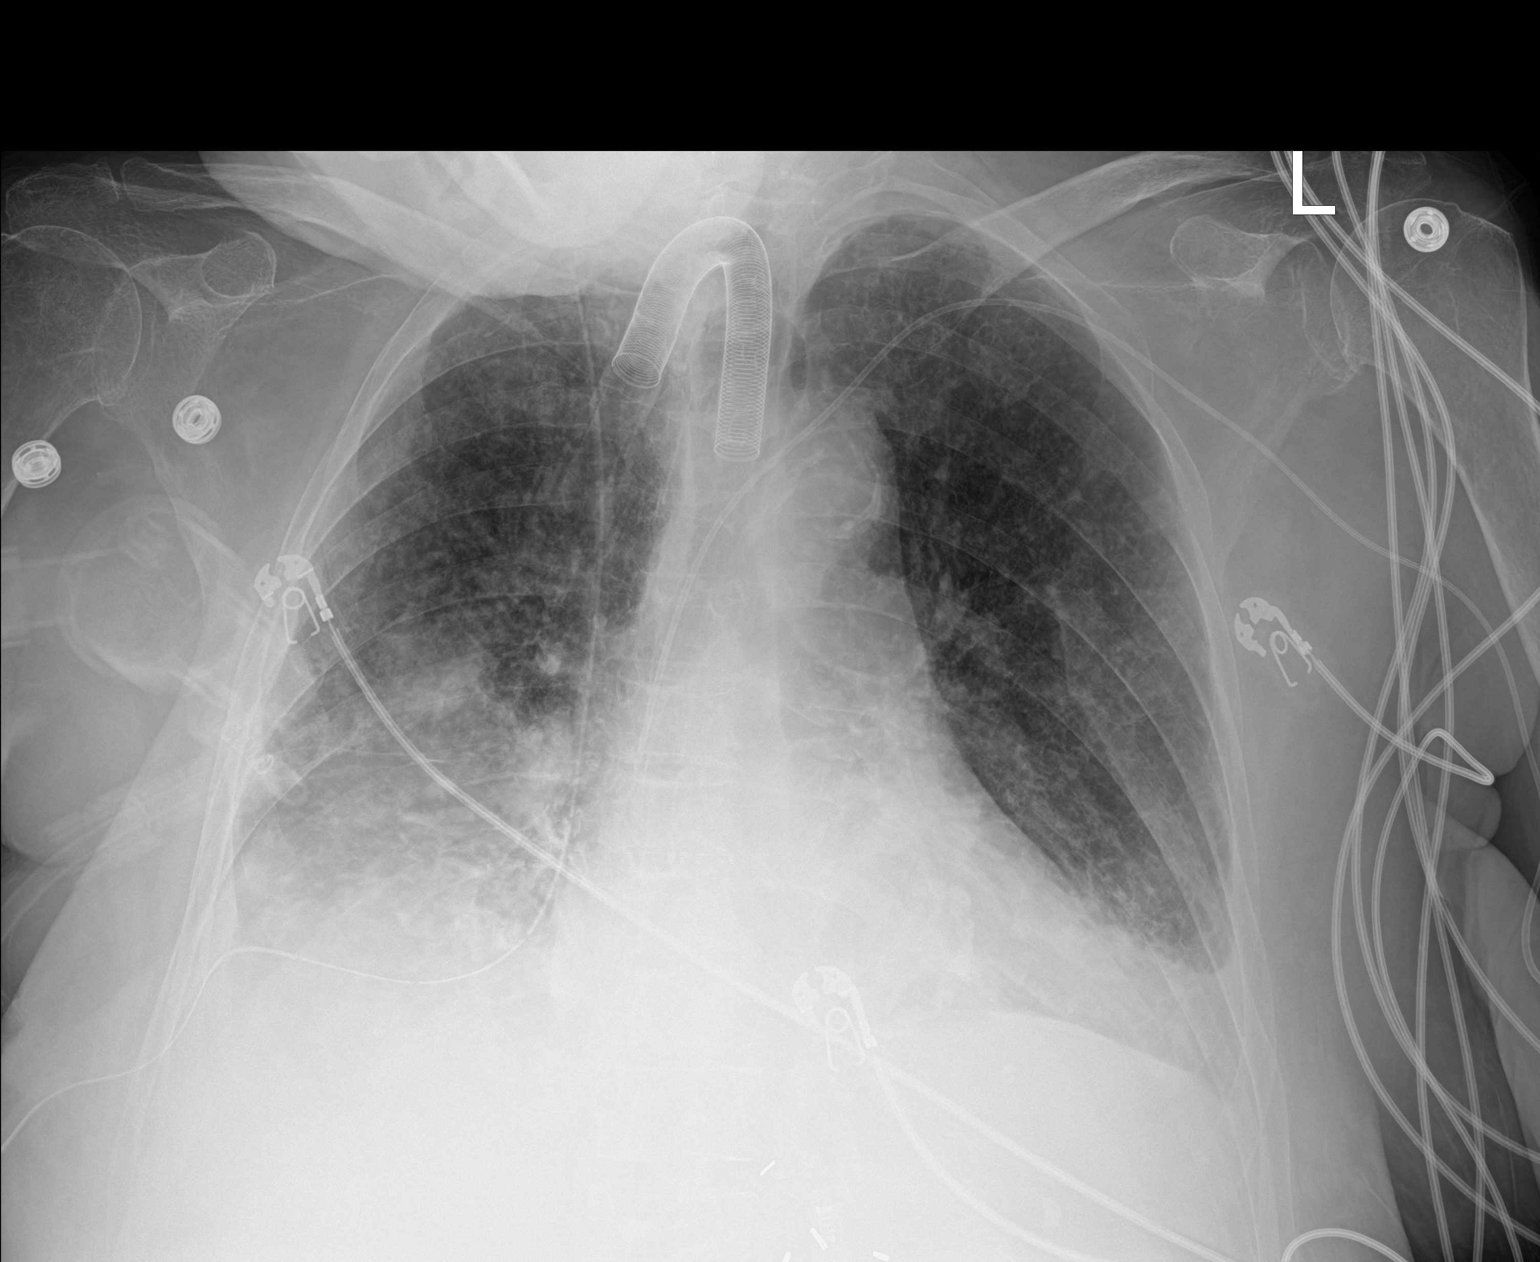

[1 of 1 positions shown; findings below may reference images not displayed]

FINDINGS: A tracheostomy tube is in place with tip 4.8 cm above the carina.
There is a left upper extremity PICC with tip terminating in the
superior cavoatrial junction. Right-sided chest tube in position
with tip projecting over the apex of the right hemithorax. Patchy
multifocal airspace disease throughout the mid to lower lungs
bilaterally (right greater than left), worsening compared to prior
study from 09/26/2018. Reticulonodular pattern noted in the
remaining portions of the more well aerated lung, corresponding to
peribronchovascular micro nodularity seen on recent chest CT. Small
bilateral pleural effusions. No evidence of pulmonary edema. Heart
size appears normal. Upper mediastinal contours are within normal
limits. Aortic atherosclerosis. Severe calcifications of the mitral
annulus.
IMPRESSION: 1. Support apparatus, as above.
2. Worsening aeration particularly throughout the mid to lower lungs
bilaterally, compatible with progressive multilobar pneumonia.
3. Aortic atherosclerosis.

## 2019-08-17 IMAGING — DX PORTABLE CHEST - 1 VIEW
1 series · 1 of 1 positions shown · non-contrast
Comparison: Chest x-ray 10/01/2018.

CLINICAL DATA: 83-year-old female with history of chronic
respiratory failure.

EXAM:
PORTABLE CHEST 1 VIEW

[chest]
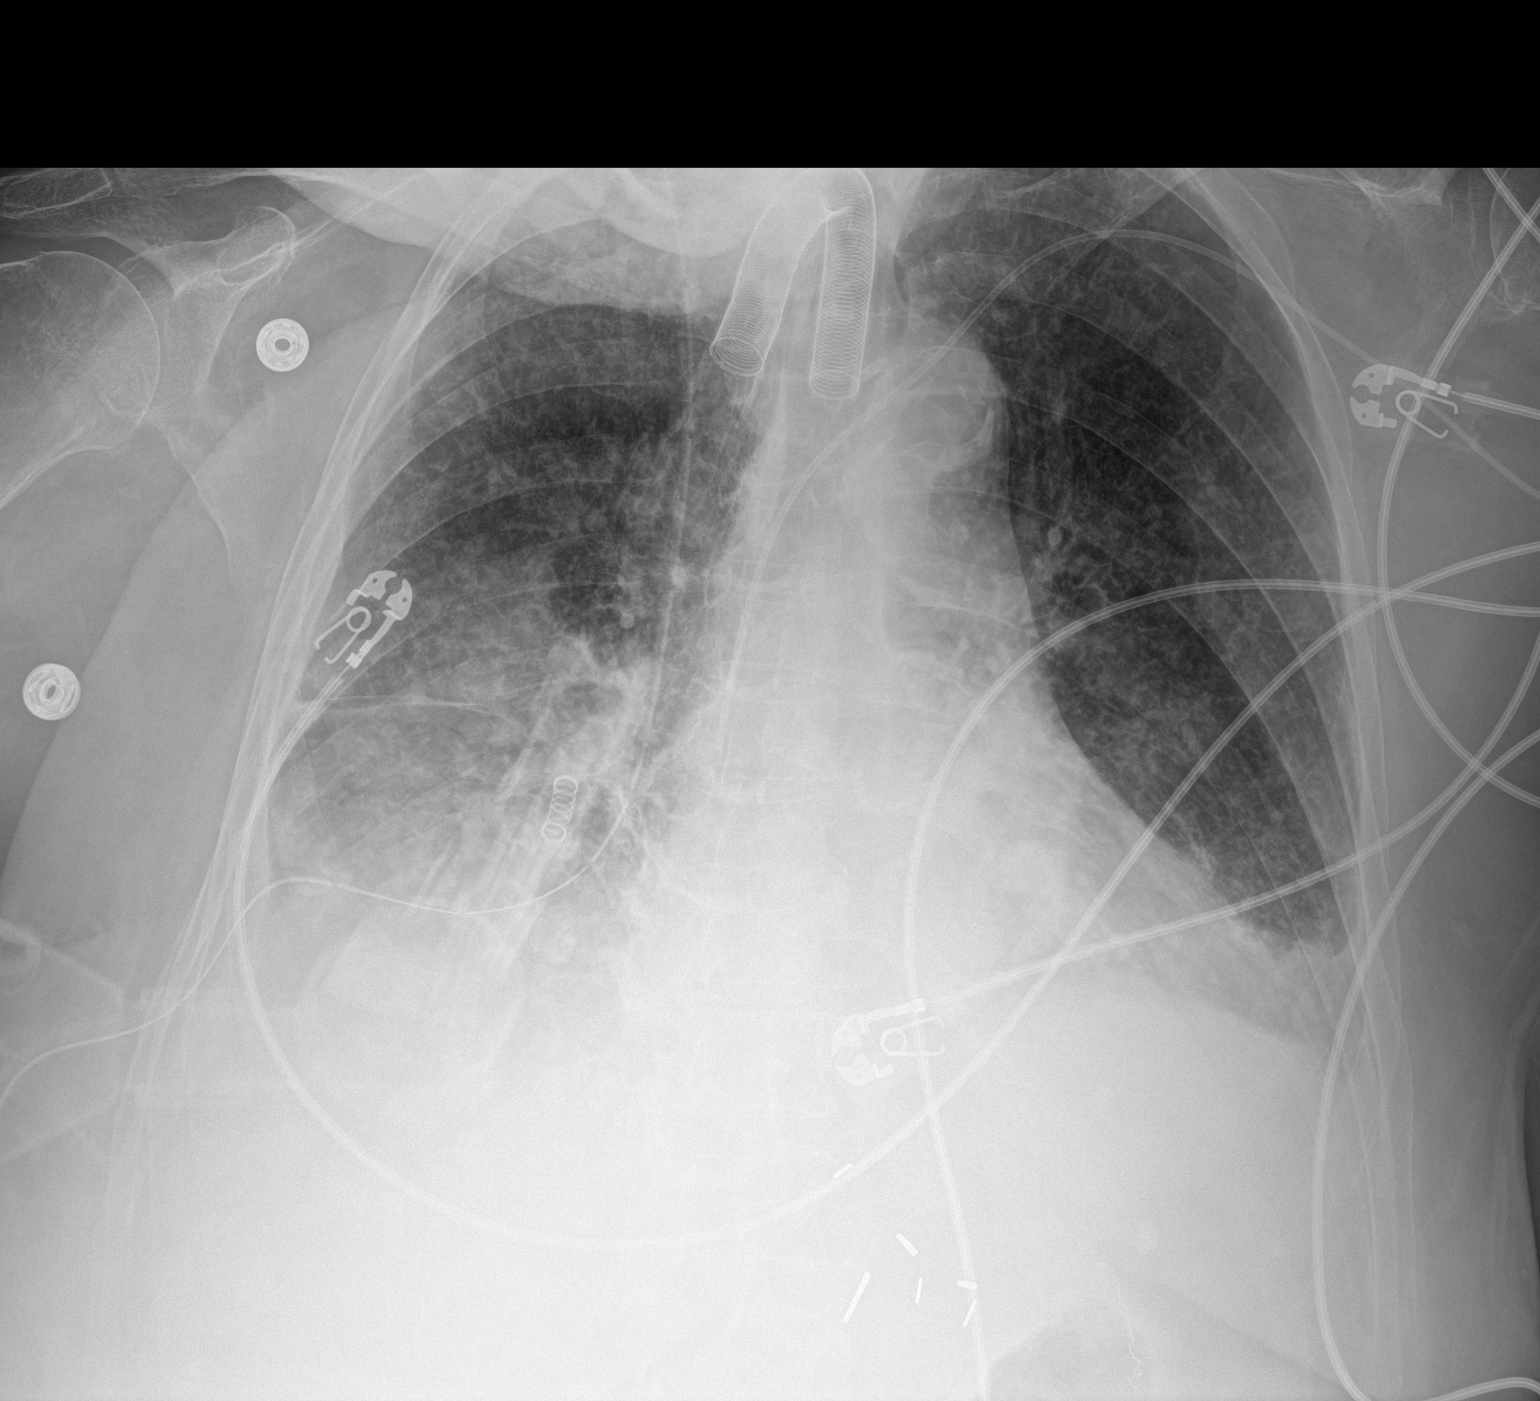

[1 of 1 positions shown; findings below may reference images not displayed]

FINDINGS: Large-bore tracheostomy tube in position with tip terminating 4 cm
above the carina. There is a left upper extremity PICC with tip
terminating in the superior cavoatrial junction. Right-sided chest
tube remains stable in position with tip directed into the apex of
the right upper hemithorax. Patchy multifocal interstitial and
airspace disease noted throughout the lungs bilaterally,
asymmetrically distributed and most confluent throughout the right
mid to lower lung, but also evident at the left lung base. Small
bilateral pleural effusions. No evidence of pulmonary edema. No
pneumothorax. Heart size is mildly enlarged. Upper mediastinal
contours are distorted by patient positioning. Aortic
atherosclerosis. Severe mitral annular calcifications. Surgical
clips project over the epigastric region.
IMPRESSION: 1. Support apparatus, as above.
2. Findings remain compatible with severe multilobar pneumonia, as
above.
3. Small bilateral pleural effusions.
4. Aortic atherosclerosis.
5. Severe mitral annular calcifications.
